# Patient Record
Sex: Male | Born: 1935 | Race: White | Hispanic: No | Smoking: Former smoker
Health system: Southern US, Community
[De-identification: ages and names within clinical notes are randomized; demographics above are authoritative.]

## PROBLEM LIST (undated history)

## (undated) DIAGNOSIS — N400 Enlarged prostate without lower urinary tract symptoms: Secondary | ICD-10-CM

## (undated) DIAGNOSIS — J45990 Exercise induced bronchospasm: Secondary | ICD-10-CM

## (undated) DIAGNOSIS — I4891 Unspecified atrial fibrillation: Secondary | ICD-10-CM

## (undated) DIAGNOSIS — R609 Edema, unspecified: Secondary | ICD-10-CM

## (undated) DIAGNOSIS — Z86010 Personal history of colonic polyps: Secondary | ICD-10-CM

## (undated) DIAGNOSIS — E785 Hyperlipidemia, unspecified: Secondary | ICD-10-CM

## (undated) DIAGNOSIS — H3530 Unspecified macular degeneration: Secondary | ICD-10-CM

## (undated) DIAGNOSIS — N189 Chronic kidney disease, unspecified: Secondary | ICD-10-CM

## (undated) DIAGNOSIS — G4700 Insomnia, unspecified: Secondary | ICD-10-CM

## (undated) DIAGNOSIS — K219 Gastro-esophageal reflux disease without esophagitis: Secondary | ICD-10-CM

## (undated) DIAGNOSIS — M47816 Spondylosis without myelopathy or radiculopathy, lumbar region: Secondary | ICD-10-CM

## (undated) DIAGNOSIS — Z96649 Presence of unspecified artificial hip joint: Secondary | ICD-10-CM

## (undated) DIAGNOSIS — I509 Heart failure, unspecified: Secondary | ICD-10-CM

## (undated) DIAGNOSIS — Z8639 Personal history of other endocrine, nutritional and metabolic disease: Secondary | ICD-10-CM

## (undated) DIAGNOSIS — J310 Chronic rhinitis: Secondary | ICD-10-CM

## (undated) DIAGNOSIS — I452 Bifascicular block: Secondary | ICD-10-CM

## (undated) DIAGNOSIS — R0602 Shortness of breath: Secondary | ICD-10-CM

## (undated) DIAGNOSIS — J45909 Unspecified asthma, uncomplicated: Secondary | ICD-10-CM

## (undated) DIAGNOSIS — I5021 Acute systolic (congestive) heart failure: Secondary | ICD-10-CM

## (undated) DIAGNOSIS — E669 Obesity, unspecified: Secondary | ICD-10-CM

## (undated) DIAGNOSIS — I10 Essential (primary) hypertension: Secondary | ICD-10-CM

## (undated) DIAGNOSIS — R531 Weakness: Secondary | ICD-10-CM

## (undated) DIAGNOSIS — M1990 Unspecified osteoarthritis, unspecified site: Secondary | ICD-10-CM

## (undated) HISTORY — DX: Bifascicular block: I45.2

## (undated) HISTORY — DX: Benign prostatic hyperplasia without lower urinary tract symptoms: N40.0

## (undated) HISTORY — DX: Unspecified asthma, uncomplicated: J45.909

## (undated) HISTORY — PX: VASECTOMY: SHX75

## (undated) HISTORY — DX: Hyperlipidemia, unspecified: E78.5

## (undated) HISTORY — DX: Essential (primary) hypertension: I10

## (undated) HISTORY — DX: Spondylosis without myelopathy or radiculopathy, lumbar region: M47.816

## (undated) HISTORY — DX: Presence of unspecified artificial hip joint: Z96.649

## (undated) HISTORY — DX: Chronic rhinitis: J31.0

## (undated) HISTORY — DX: Insomnia, unspecified: G47.00

## (undated) HISTORY — DX: Unspecified atrial fibrillation: I48.91

## (undated) HISTORY — DX: Personal history of colonic polyps: Z86.010

## (undated) HISTORY — DX: Weakness: R53.1

## (undated) HISTORY — DX: Edema, unspecified: R60.9

## (undated) HISTORY — DX: Acute systolic (congestive) heart failure: I50.21

## (undated) HISTORY — DX: Obesity, unspecified: E66.9

## (undated) HISTORY — DX: Personal history of other endocrine, nutritional and metabolic disease: Z86.39

## (undated) HISTORY — PX: VASCULAR SURGERY: SHX849

---

## 1999-03-25 ENCOUNTER — Ambulatory Visit: Admission: RE | Admit: 1999-03-25 | Discharge: 1999-03-25 | Payer: Self-pay | Admitting: Internal Medicine

## 2002-12-19 ENCOUNTER — Encounter: Payer: Self-pay | Admitting: Internal Medicine

## 2002-12-19 ENCOUNTER — Encounter: Admission: RE | Admit: 2002-12-19 | Discharge: 2002-12-19 | Payer: Self-pay | Admitting: Internal Medicine

## 2004-04-08 ENCOUNTER — Ambulatory Visit: Payer: Self-pay | Admitting: Internal Medicine

## 2004-06-29 ENCOUNTER — Ambulatory Visit: Payer: Self-pay | Admitting: Internal Medicine

## 2006-04-08 ENCOUNTER — Ambulatory Visit: Payer: Self-pay | Admitting: Internal Medicine

## 2006-10-18 ENCOUNTER — Ambulatory Visit: Payer: Self-pay | Admitting: Internal Medicine

## 2006-10-18 LAB — CONVERTED CEMR LAB
BUN: 18 mg/dL (ref 6–23)
GFR calc non Af Amer: 100 mL/min
Glucose, Bld: 93 mg/dL (ref 70–99)
PSA: 0.74 ng/mL (ref 0.10–4.00)
Potassium: 4.4 meq/L (ref 3.5–5.1)

## 2007-02-06 DIAGNOSIS — J45909 Unspecified asthma, uncomplicated: Secondary | ICD-10-CM | POA: Insufficient documentation

## 2007-02-06 DIAGNOSIS — I10 Essential (primary) hypertension: Secondary | ICD-10-CM | POA: Insufficient documentation

## 2007-02-06 HISTORY — DX: Unspecified asthma, uncomplicated: J45.909

## 2007-02-17 ENCOUNTER — Ambulatory Visit: Payer: Self-pay | Admitting: Internal Medicine

## 2007-04-26 ENCOUNTER — Ambulatory Visit: Payer: Self-pay | Admitting: Internal Medicine

## 2007-04-26 LAB — CONVERTED CEMR LAB
BUN: 15 mg/dL (ref 6–23)
Calcium: 9.9 mg/dL (ref 8.4–10.5)
Creatinine, Ser: 0.7 mg/dL (ref 0.4–1.5)
GFR calc non Af Amer: 116 mL/min
Hgb A1c MFr Bld: 5.6 % (ref 4.6–6.0)
Sodium: 139 meq/L (ref 135–145)

## 2007-06-12 ENCOUNTER — Telehealth (INDEPENDENT_AMBULATORY_CARE_PROVIDER_SITE_OTHER): Payer: Self-pay | Admitting: *Deleted

## 2007-08-30 ENCOUNTER — Ambulatory Visit: Payer: Self-pay | Admitting: Internal Medicine

## 2007-08-30 LAB — CONVERTED CEMR LAB
CO2: 34 meq/L — ABNORMAL HIGH (ref 19–32)
Chloride: 106 meq/L (ref 96–112)
Creatinine, Ser: 0.6 mg/dL (ref 0.4–1.5)
Hgb A1c MFr Bld: 5.7 % (ref 4.6–6.0)

## 2008-01-02 ENCOUNTER — Ambulatory Visit: Payer: Self-pay | Admitting: Internal Medicine

## 2008-01-02 DIAGNOSIS — G4700 Insomnia, unspecified: Secondary | ICD-10-CM

## 2008-01-02 HISTORY — DX: Insomnia, unspecified: G47.00

## 2008-01-02 LAB — CONVERTED CEMR LAB
CO2: 30 meq/L (ref 19–32)
Chloride: 101 meq/L (ref 96–112)
Glucose, Bld: 101 mg/dL — ABNORMAL HIGH (ref 70–99)
Sodium: 138 meq/L (ref 135–145)

## 2008-02-15 ENCOUNTER — Ambulatory Visit: Payer: Self-pay | Admitting: Internal Medicine

## 2008-04-03 ENCOUNTER — Ambulatory Visit: Payer: Self-pay | Admitting: Internal Medicine

## 2008-04-03 LAB — CONVERTED CEMR LAB
Albumin: 4.1 g/dL (ref 3.5–5.2)
Alkaline Phosphatase: 68 units/L (ref 39–117)
Bilirubin, Direct: 0.1 mg/dL (ref 0.0–0.3)
Total Protein: 6.9 g/dL (ref 6.0–8.3)

## 2008-07-03 ENCOUNTER — Telehealth: Payer: Self-pay | Admitting: Internal Medicine

## 2008-07-03 ENCOUNTER — Ambulatory Visit: Payer: Self-pay | Admitting: Internal Medicine

## 2008-07-03 DIAGNOSIS — J330 Polyp of nasal cavity: Secondary | ICD-10-CM | POA: Insufficient documentation

## 2008-11-01 ENCOUNTER — Ambulatory Visit: Payer: Self-pay | Admitting: Internal Medicine

## 2008-11-01 DIAGNOSIS — E785 Hyperlipidemia, unspecified: Secondary | ICD-10-CM | POA: Insufficient documentation

## 2008-11-01 LAB — CONVERTED CEMR LAB
Cholesterol: 237 mg/dL — ABNORMAL HIGH (ref 0–200)
GFR calc non Af Amer: 115.47 mL/min (ref >60)
Glucose, Bld: 104 mg/dL — ABNORMAL HIGH (ref 70–99)
HDL: 46.6 mg/dL (ref >39.00)
Hgb A1c MFr Bld: 5.8 % (ref 4.6–6.5)
Microalb Creat Ratio: 2.9 mg/g (ref 0.0–30.0)
Microalb, Ur: 0.2 mg/dL (ref 0.0–1.9)
Triglycerides: 101 mg/dL (ref 0.0–149.0)

## 2009-03-04 ENCOUNTER — Ambulatory Visit: Payer: Self-pay | Admitting: Internal Medicine

## 2009-03-04 LAB — CONVERTED CEMR LAB
CO2: 30 meq/L (ref 19–32)
Calcium: 9.5 mg/dL (ref 8.4–10.5)
GFR calc non Af Amer: 98.9 mL/min (ref >60)
Glucose, Bld: 89 mg/dL (ref 70–99)
Potassium: 4.1 meq/L (ref 3.5–5.1)

## 2009-06-02 ENCOUNTER — Ambulatory Visit: Payer: Self-pay | Admitting: Internal Medicine

## 2009-06-02 DIAGNOSIS — J310 Chronic rhinitis: Secondary | ICD-10-CM | POA: Insufficient documentation

## 2009-06-02 HISTORY — DX: Chronic rhinitis: J31.0

## 2009-10-01 ENCOUNTER — Ambulatory Visit: Payer: Self-pay | Admitting: Internal Medicine

## 2009-10-01 DIAGNOSIS — Z8639 Personal history of other endocrine, nutritional and metabolic disease: Secondary | ICD-10-CM | POA: Insufficient documentation

## 2009-10-01 HISTORY — DX: Personal history of other endocrine, nutritional and metabolic disease: Z86.39

## 2009-10-01 LAB — CONVERTED CEMR LAB
CO2: 33 meq/L — ABNORMAL HIGH (ref 19–32)
Calcium: 9.7 mg/dL (ref 8.4–10.5)
Chloride: 98 meq/L (ref 96–112)
Creatinine, Ser: 1 mg/dL (ref 0.4–1.5)
GFR calc non Af Amer: 76.33 mL/min (ref >60)
Sodium: 141 meq/L (ref 135–145)
TSH: 1.59 microintl units/mL (ref 0.35–5.50)

## 2010-02-13 ENCOUNTER — Ambulatory Visit: Payer: Self-pay | Admitting: Internal Medicine

## 2010-02-13 DIAGNOSIS — L210 Seborrhea capitis: Secondary | ICD-10-CM | POA: Insufficient documentation

## 2010-02-13 LAB — CONVERTED CEMR LAB
CO2: 31 meq/L (ref 19–32)
Chloride: 100 meq/L (ref 96–112)
Cholesterol: 249 mg/dL — ABNORMAL HIGH (ref 0–200)
Direct LDL: 181.2 mg/dL
Hgb A1c MFr Bld: 5.9 % (ref 4.6–6.5)
Potassium: 4.4 meq/L (ref 3.5–5.1)

## 2010-05-13 ENCOUNTER — Ambulatory Visit
Admission: RE | Admit: 2010-05-13 | Discharge: 2010-05-13 | Payer: Self-pay | Source: Home / Self Care | Attending: Internal Medicine | Admitting: Internal Medicine

## 2010-05-13 DIAGNOSIS — N400 Enlarged prostate without lower urinary tract symptoms: Secondary | ICD-10-CM

## 2010-05-13 HISTORY — DX: Benign prostatic hyperplasia without lower urinary tract symptoms: N40.0

## 2010-05-21 ENCOUNTER — Ambulatory Visit
Admission: RE | Admit: 2010-05-21 | Discharge: 2010-05-21 | Payer: Self-pay | Source: Home / Self Care | Attending: Internal Medicine | Admitting: Internal Medicine

## 2010-05-21 LAB — CONVERTED CEMR LAB
Cholesterol, target level: 200 mg/dL
LDL Goal: 100 mg/dL

## 2010-06-18 NOTE — Assessment & Plan Note (Signed)
Summary: ?psa inf/njr   Vital Signs:  Patient profile:   77 year old male Weight:      236 pounds Temp:     97.7 degrees F oral BP sitting:   122 / 80  (left arm) Cuff size:   regular  Vitals Entered By: Duard Brady LPN (May 13, 2010 8:04 AM)  [facility]: prostate concerns Is Patient Diabetic? No   Primary Care Provider:  Stacie Glaze MD  CC:  prostate concerns.  History of Present Illness: 37 -year-old patient, who presents with a chief complaint of increased urinary frequency and difficulty.  He has chronic nocturia 3 to 4 times per night and this has increased recently.  He has more urgency and frequency throughout the day.  He states that he must now sit to urinate more efficiently.  A PSA in 2008 was less than one.  He has treated hypertension on diuretic therapy.  No fever, perineal  pain, or constitutional complaints.  No prior history of prostate infection.  His only new medication is fish oil supplements  that he now takes for arthritic complaints.  Allergies (verified): No Known Drug Allergies  Past History:  Past Medical History: Obesity Asthma Diabetes mellitus, type II Hypertension Edema Benign prostatic hypertrophy  Review of Systems       The patient complains of difficulty walking.  The patient denies anorexia, fever, weight loss, weight gain, vision loss, decreased hearing, hoarseness, chest pain, syncope, dyspnea on exertion, peripheral edema, prolonged cough, headaches, hemoptysis, abdominal pain, melena, hematochezia, severe indigestion/heartburn, hematuria, incontinence, genital sores, muscle weakness, suspicious skin lesions, transient blindness, depression, unusual weight change, abnormal bleeding, enlarged lymph nodes, angioedema, breast masses, and testicular masses.    Physical Exam  General:  overweight-appearing.  normal blood pressureoverweight-appearing.   Rectal:  No external abnormalities noted. Normal sphincter tone. No rectal  masses or tenderness. stool hematest negative Prostate:  2+ enlarged.  the right lobe, slightly more prominent2+ enlarged.     Impression & Recommendations:  Problem # 1:  BENIGN PROSTATIC HYPERTROPHY (ICD-600.00)  His updated medication list for this problem includes:    Tamsulosin Hcl 0.4 Mg Caps (Tamsulosin hcl) ..... One daily  His updated medication list for this problem includes:    Tamsulosin Hcl 0.4 Mg Caps (Tamsulosin hcl) ..... One daily  Problem # 2:  HYPERTENSION (ICD-401.9)  His updated medication list for this problem includes:    Maxzide 75-50 Mg Tabs (Triamterene-hctz) .[name]... 1 once daily  His updated medication list for this problem includes:    Maxzide 75-50 Mg Tabs (Triamterene-hctz) .[name]... 1 once daily  Complete Medication List: 1)  Maxzide 75-50 Mg Tabs (Triamterene-hctz) .[name].. 1 once daily 2)  Adult Aspirin Low Strength 81 Mg Tbdp (Aspirin) .[name].. 1 once daily 3)  Albuterol 90 Mcg/act Aers (Albuterol) .... As needed 4)  Multivitamins Tabs (Multiple vitamin) .... Take 1 by mouth qd 5)  Ocuvite Preservision Tabs (Multiple vitamins-minerals) .... 2 once daily 6)  Zolpidem Tartrate 10 Mg Tabs (Zolpidem tartrate) .[name].. 1 at bedtime as needed sleep 7)  Astepro 0.15 % Soln (Azelastine hcl) .... Two spray in each nostril daily 8)  Clobetasol Propionate 0.05 % Foam (Clobetasol propionate) .... Apply to scalp daily as needed for sebborrhea 9)  Fish Oil 1000 Mg Caps (Omega-3 fatty acids) .... Qday 10)  Tamsulosin Hcl 0.4 Mg Caps (Tamsulosin hcl) .... One daily  Patient Instructions: 1)  Limit your Sodium (Salt) to less than 2 grams a day(slightly less than 1/2 a  teaspoon) to prevent fluid retention, swelling, or worsening of symptoms. 2)  It is important that you exercise regularly at least 20 minutes 5 times a week. If you develop chest pain, have severe difficulty breathing, or feel very tired , stop exercising immediately and seek medical attention. 3)  follow-up next  week as scheduled Prescriptions: TAMSULOSIN HCL 0.4 MG CAPS (TAMSULOSIN HCL) one daily  #90 x 0   Entered and Authorized by:   [name]  MD   Signed by:   [name]  MD on 05/13/2010   Method used:   Electronically to        [facility] [phone]* (retail)       [address]       [place], [place]  [address]       Ph: [phone]       Fax: [phone]   RxID:   [id number]    Orders Added: 1)  Est. Patient Level III [24401]

## 2010-06-18 NOTE — Assessment & Plan Note (Signed)
Summary: 3 month rov/njr---PT [facility] // RS   Vital Signs:  Patient profile:   77 year old male Height:      67 inches Weight:      234 pounds BMI:     36.78 Temp:     98.2 degrees F oral Pulse rate:   76 / minute Resp:     14 per minute BP sitting:   110 / 70  (left arm)  Vitals Entered By: Willy Eddy, LPN (February 13, 2010 11:00 AM) CC: roa, Hypertension Management Is Patient Diabetic? No  2  Primary Care Provider:  Stacie Glaze MD  CC:  roa and Hypertension Management.  History of Present Illness: increased vasomotor rhinitis, has not been taking the astepro he has not had any ambien and has not been able to sleep well   Hypertension History:      He denies headache, chest pain, palpitations, dyspnea with exertion, orthopnea, PND, peripheral edema, visual symptoms, neurologic problems, syncope, and side effects from treatment.        Positive major cardiovascular risk factors include male age 56 years old or older, diabetes, hyperlipidemia, and hypertension.  Negative major cardiovascular risk factors include non-tobacco-user status.     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 1 Once Daily 2)  Adult Aspirin Low Strength 81  Mg  Tbdp (Aspirin) .[name].. 1 Once Daily 3)  Albuterol 90 Mcg/act  Aers (Albuterol) .... As Needed 4)  Multivitamins   Tabs (Multiple Vitamin) .... Take 1 By Mouth Qd 5)  Ocuvite Preservision  Tabs (Multiple Vitamins-Minerals) .... 2 Once Daily 6)  Zolpidem Tartrate 10 Mg Tabs (Zolpidem Tartrate) .[name].. 1 At Bedtime As Needed Sleep 7)  Astepro 0.15 % Soln (Azelastine Hcl) .... Two Spray in Each Nostril Daily  Current Medications (verified): 1)  Maxzide 75-50 Mg  Tabs (Triamterene-Hctz) .[name].. 1 Once Daily 2)  Adult Aspirin Low Strength 81 Mg  Tbdp (Aspirin) .[name].. 1 Once Daily 3)  Albuterol 90 Mcg/act  Aers (Albuterol) .... As Needed 4)  Multivitamins   Tabs (Multiple Vitamin) .... Take 1 By Mouth Qd 5)  Ocuvite Preservision  Tabs (Multiple Vitamins-Minerals) .... 2 Once Daily 6)  Zolpidem Tartrate 10 Mg Tabs (Zolpidem Tartrate) .[name].. 1 At Bedtime As Needed Sleep 7)  Astepro 0.15 % Soln (Azelastine Hcl) .... Two Spray in Each Nostril Daily  Allergies (verified): No Known Drug Allergies  Past History:  Social History: Last updated: 02/06/2007 Retired Married Never Smoked  Risk Factors: Smoking Status: never (02/13/2010) Passive Smoke Exposure: no (02/13/2010)  Past medical, surgical, family and social histories (including risk factors) reviewed, and no changes noted (except as noted below).  Past Medical History: Reviewed history from 02/06/2007 and no changes required. Obesity  Asthma Diabetes mellitus, type II Hypertension Edema  Past Surgical History: Reviewed history from 02/06/2007 and no changes required. Denies surgical history  Family History: Reviewed history and no changes required.  Social History: Reviewed history from 02/06/2007 and no changes required. Retired Married Never Smoked Passive Smoke Exposure:  no  Review of Systems  The patient denies anorexia, fever, weight loss, weight gain, vision loss, decreased hearing, hoarseness, chest pain, syncope,  dyspnea on exertion, peripheral edema, prolonged cough, headaches, hemoptysis, abdominal pain, melena, hematochezia, severe indigestion/heartburn, hematuria, incontinence, genital sores, muscle weakness, suspicious skin lesions, transient blindness, difficulty walking, depression, unusual weight change, abnormal bleeding, enlarged lymph nodes, angioedema, breast masses, and testicular masses.         Flu Vaccine Consent Questions     Do you have a history of severe allergic reactions to this vaccine? no    Any prior history of allergic reactions to egg and/or gelatin? no    Do you have a sensitivity to the preservative Thimersol? no    Do you have a past history of Guillan-Barre Syndrome? no    Do you currently have an acute febrile illness? no    Have you ever had a severe reaction to latex? no    Vaccine information given and explained to patient? yes    Are you currently pregnant? no    Lot Number:[id number]   Exp Date:11/14/2010   Site Given  Left Deltoid IM   Physical Exam  General:  alert and overweight-appearing.   Head:  Normocephalic and atraumatic without obvious abnormalities. No apparent alopecia or balding. grey hair Ears:  R ear normal and L ear normal.   Nose:  no external deformity, mucosal erythema, and mucosal edema.   Mouth:  postnasal drip.   Neck:  supple and full ROM.   Lungs:  normal respiratory effort and no wheezes.   Heart:  normal rate and regular rhythm.   Abdomen:  soft, non-tender, and normal bowel sounds.   Msk:  normal ROM and no joint tenderness.   Extremities:  trace left pedal edema and trace right pedal edema.   Neurologic:  alert & oriented X3 and DTRs symmetrical and normal.     Impression & Recommendations:  Problem # 1:  CHRONIC RHINITIS (ICD-472.0) astepro rx given  Problem # 2:  SEBORRHEA CAPITIS (ICD-690.11) clobetason foam trial  Problem # 3:  INSOMNIA, CHRONIC, MILD (ICD-307.42) refill the ambien  Problem # 4:  HYPERTENSION  (ICD-401.9)  His updated medication list for this problem includes:    Maxzide 75-50 Mg Tabs (Triamterene-hctz) .[name]... 1 once daily  BP today: 110/70 Prior BP: 110/70 (10/01/2009)  Prior 10 Yr Risk Heart Disease: Not enough information (04/26/2007)  Labs Reviewed: K+: 4.5 (10/01/2009) Creat: : 1.0 (10/01/2009)   Chol: 254 (10/01/2009)   HDL: 54.40 (10/01/2009)   TG: 101.0 (11/01/2008)  Complete Medication List: 1)  Maxzide 75-50 Mg Tabs (Triamterene-hctz) .[name].. 1 once daily 2)  Adult Aspirin Low Strength 81 Mg Tbdp (Aspirin) .[name].. 1 once daily 3)  Albuterol 90 Mcg/act Aers (Albuterol) .... As needed 4)  Multivitamins Tabs (Multiple vitamin) .... Take 1 by mouth qd 5)  Ocuvite Preservision Tabs (Multiple vitamins-minerals) .... 2 once daily 6)  Zolpidem Tartrate 10 Mg Tabs (Zolpidem tartrate) .[name].. 1 at bedtime as needed sleep 7)  Astepro 0.15 % Soln (Azelastine hcl) .... Two spray in each nostril daily 8)  Clobetasol Propionate 0.05 % Foam (Clobetasol propionate) .... Apply to scalp daily as needed for sebborrhea  Other Orders: Flu Vaccine 77yrs + MEDICARE PATIENTS (E4540) Administration Flu vaccine - MCR (G0008) Venipuncture (98119) TLB-BMP (Basic Metabolic Panel-BMET) (80048-METABOL) TLB-A1C / Hgb A1C (Glycohemoglobin) (83036-A1C) TLB-Cholesterol, HDL (83718-HDL) TLB-Cholesterol, Direct LDL (83721-DIRLDL) TLB-Cholesterol, Total (82465-CHO)  Hypertension Assessment/Plan:      The patient's hypertensive risk group is category C: Target organ damage and/or diabetes.  Today's blood pressure is 110/70.  His blood pressure goal is < 130/80.  Patient Instructions: 1)  Please schedule a follow-up appointment in 4 months. Prescriptions: CLOBETASOL PROPIONATE 0.05 % FOAM (CLOBETASOL PROPIONATE) apply to scalp daily as needed for sebborrhea  #1 x 6   Entered and Authorized by:   Stacie Glaze MD   Signed by:   Stacie Glaze MD on 02/13/2010   Method used:   Electronically to         [facility] [phone]* (retail)       [address]       [place], [place]  [address]       Ph: [phone]       Fax: [phone]   RxID:   [id number] CLOBETASOL PROPIONATE 0.05 % FOAM (CLOBETASOL PROPIONATE) apply to scalp daily as needed for sebborrhea  #1 can x 6   Entered and Authorized by:   Stacie Glaze MD   Signed by:   Stacie Glaze MD on 02/13/2010   Method used:   Print then Give to Patient   RxID:   [id number] ZOLPIDEM TARTRATE 10 MG TABS (ZOLPIDEM TARTRATE) 1 at bedtime as needed sleep  #30 x 5   Entered and Authorized by:   Stacie Glaze MD   Signed by:   Stacie Glaze MD on 02/13/2010   Method used:   Print then Give to Patient   RxID:   [id number] ASTEPRO 0.15 % SOLN (AZELASTINE HCL) two spray in each nostril daily  #1 vial x 11   Entered and Authorized by:   Stacie Glaze MD   Signed by:   Stacie Glaze MD on 02/13/2010   Method used:   Electronically to        [facility] [phone]* (retail)       [address]       [place], [place]  [address]       Ph: [phone]       Fax: [phone]   RxID:   [id number]   Appended Document: Orders Update    Clinical Lists Changes  Orders: Added new Service order of Specimen Handling (94854) - Signed

## 2010-06-18 NOTE — Assessment & Plan Note (Signed)
Summary: 3 MONTH RIOV/NJR/pt [facility]//ccm   Vital Signs:  Patient profile:   77 year old male Height:      67 inches Weight:      240 pounds BMI:     37.73 Temp:     98.2 degrees F oral Pulse rate:   76 / minute Resp:     14 per minute BP sitting:   110 / 70  (left arm)  Vitals Entered By: Willy Eddy, LPN (Oct 01, 2009 8:38 AM) CC: roa, Hypertension Management   CC:  roa and Hypertension Management.  History of Present Illness: The pts edema in the legs is worse at the end of the day and he has resumed a full tablet   Hypertension History:      He complains of peripheral edema, but denies headache, chest pain, palpitations, dyspnea with exertion, orthopnea, PND, visual symptoms, neurologic problems, syncope, and side effects from treatment.  no sob or cp.        Positive major cardiovascular risk factors include male age 59 years old or older, diabetes, hyperlipidemia, and hypertension.  Negative major cardiovascular risk factors include non-tobacco-user status.     Preventive Screening-Counseling & Management  Alcohol-Tobacco     Smoking Status: never  Problems Prior to Update: 1)  Chronic Rhinitis  (ICD-472.0) 2)  Hyperlipidemia  (ICD-272.4) 3)  Nasal Polyp  (ICD-471.0) 4)  Insomnia, Chronic, Mild  (ICD-307.42) 5)  Hypertension  (ICD-401.9) 6)  Diabetes Mellitus, Type II  (ICD-250.00) 7)  Asthma  (ICD-493.90)  Current Problems (verified): 1)  Chronic Rhinitis  (ICD-472.0) 2)  Hyperlipidemia  (ICD-272.4) 3)  Nasal Polyp  (ICD-471.0) 4)  Insomnia, Chronic, Mild  (ICD-307.42) 5)  Hypertension  (ICD-401.9) 6)  Diabetes Mellitus, Type II  (ICD-250.00) 7)  Asthma  (ICD-493.90)  Medications Prior to Update: 1)  Maxzide 75-50 Mg  Tabs (Triamterene-Hctz) .[name].. 1 Once Daily 2)  Adult Aspirin Low Strength 81 Mg  Tbdp (Aspirin) .[name].. 1 Once Daily 3)  Albuterol 90 Mcg/act  Aers (Albuterol) .... As Needed 4)  Vitamin C 1000 Mg  Tabs (Ascorbic Acid) .... Take 1 By Mouth  Qd 5)  Vitamin E 1000 Unit  Caps (Vitamin E) .... Take 1 By Mouth Qd 6)  Zinc 50 Mg  Caps (Zinc) .... Take1 By Mouth Qd 7)  Multivitamins   Tabs (Multiple Vitamin) .... Take 1 By Mouth Qd 8)  Ocuvite Preservision  Tabs (Multiple Vitamins-Minerals) .... 2 Once Daily 9)  Zolpidem Tartrate 10 Mg Tabs (Zolpidem Tartrate) .[name].. 1 At Bedtime As Needed Sleep 10)  Astepro 0.15 % Soln (Azelastine Hcl) .... Two Spray in Each Nostril Daily  Current Medications (verified): 1)  Maxzide 75-50 Mg  Tabs (Triamterene-Hctz) .[name].. 1 Once Daily 2)  Adult Aspirin Low Strength 81 Mg  Tbdp (Aspirin) .[name].. 1 Once Daily 3)  Albuterol 90 Mcg/act  Aers (Albuterol) .... As Needed 4)  Multivitamins   Tabs (Multiple Vitamin) .... Take 1 By Mouth Qd 5)  Ocuvite Preservision  Tabs (Multiple Vitamins-Minerals) .... 2 Once Daily 6)  Zolpidem Tartrate 10 Mg Tabs (Zolpidem Tartrate) .[name].. 1 At Bedtime As Needed Sleep 7)  Astepro 0.15 % Soln (Azelastine Hcl) .... Two Spray in Each Nostril Daily  Allergies (verified): No Known Drug Allergies  Past History:  Social History: Last updated: 02/06/2007 Retired Married Never Smoked  Risk Factors: Smoking Status: never (10/01/2009)  Past medical, surgical, family and social histories (including risk factors) reviewed, and no changes noted (except as noted below).  Past Medical History: Reviewed history from 02/06/2007 and no changes required. Obesity Asthma Diabetes mellitus, type II Hypertension Edema  Past Surgical History: Reviewed history from 02/06/2007 and no changes required. Denies surgical history  Family History: Reviewed history and no changes required.  Social History: Reviewed history from 02/06/2007 and no changes required. Retired Married Never Smoked  Review of Systems  The patient denies anorexia, fever, weight loss, weight gain, vision loss, decreased hearing, hoarseness, chest pain, syncope, dyspnea on exertion, peripheral edema, prolonged  cough, headaches, hemoptysis, abdominal pain, melena, hematochezia, severe indigestion/heartburn, hematuria, incontinence, genital sores, muscle weakness, suspicious skin lesions, transient blindness, difficulty walking, depression, unusual weight change, abnormal bleeding, enlarged lymph nodes, angioedema, and breast masses.    Physical Exam  General:  alert and overweight-appearing.   Head:  Normocephalic and atraumatic without obvious abnormalities. No apparent alopecia or balding. grey hair Ears:  R ear normal and L ear normal.   Nose:  no external deformity, mucosal erythema, and mucosal edema.   Mouth:  postnasal drip.   Lungs:  normal respiratory effort and no wheezes.   Heart:  normal rate and regular rhythm.     Impression & Recommendations:  Problem # 1:  HYPERTENSION (ICD-401.9)  mild edema His updated medication list for this problem includes:    Maxzide 75-50 Mg Tabs (Triamterene-hctz) .[name]... 1 once daily  Orders: Venipuncture (16109) TLB-BMP (Basic Metabolic Panel-BMET) (80048-METABOL)  Problem # 2:  ASTHMA (ICD-493.90) stable His updated medication list for this problem includes:    Albuterol 90 Mcg/act Aers (Albuterol) .[name]... As needed  Problem # 3:  DYSMETABOLIC SYNDROME X (ICD-277.7) walking and weight loss needed  Complete Medication List: 1)  Maxzide 75-50 Mg Tabs (Triamterene-hctz) .[name].. 1 once daily 2)  Adult Aspirin Low Strength 81 Mg Tbdp (Aspirin) .[name].. 1 once daily 3)  Albuterol 90 Mcg/act Aers (Albuterol) .... As needed 4)  Multivitamins Tabs (Multiple vitamin) .... Take 1 by mouth qd 5)  Ocuvite Preservision Tabs (Multiple vitamins-minerals) .... 2 once daily 6)  Zolpidem Tartrate 10 Mg Tabs (Zolpidem tartrate) .[name].. 1 at bedtime as needed sleep 7)  Astepro 0.15 % Soln (Azelastine hcl) .... Two spray in each nostril daily  Other Orders: TLB-Cholesterol, HDL (83718-HDL) TLB-Cholesterol, Direct LDL (83721-DIRLDL) TLB-Cholesterol, Total  (82465-CHO) TLB-TSH (Thyroid Stimulating Hormone) (84443-TSH)  Hypertension Assessment/Plan:      The patient's hypertensive risk group is category C: Target organ damage and/or diabetes.  Today's blood pressure is 110/70.  His blood pressure goal is < 130/80.  Patient Instructions: 1)  It is important that you exercise regularly at least 20 minutes 5 times a week. If you develop chest pain, have severe difficulty breathing, or feel very tired , stop exercising immediately and seek medical attention.

## 2010-06-18 NOTE — Assessment & Plan Note (Signed)
Summary: 3 MNTH ROV//SLM   Vital Signs:  Patient profile:   77 year old male Height:      67 inches Weight:      236 pounds BMI:     37.10 Temp:     98.2 degrees F oral Pulse rate:   80 / minute Resp:     14 per minute BP sitting:   124 / 80  (left arm)  Vitals Entered By: Willy Eddy, LPN (June 02, 2009 9:49 AM)  Nutrition Counseling: Patient's BMI is greater than 25 and therefore counseled on weight management options. CC: roa, Hypertension Management   CC:  roa and Hypertension Management.  History of Present Illness: Pt has been on a diet since the holidays and has loss most of what he gained his cbgs are stable  Hypertension History:      He denies headache, chest pain, palpitations, dyspnea with exertion, orthopnea, PND, peripheral edema, visual symptoms, neurologic problems, syncope, and side effects from treatment.  He notes no problems with any antihypertensive medication side effects.        Positive major cardiovascular risk factors include male age 37 years old or older, diabetes, hyperlipidemia, and hypertension.  Negative major cardiovascular risk factors include non-tobacco-user status.      Problems Prior to Update: 1)  Hyperlipidemia  (ICD-272.4) 2)  Nasal Polyp  (ICD-471.0) 3)  Insomnia, Chronic, Mild  (ICD-307.42) 4)  Hypertension  (ICD-401.9) 5)  Diabetes Mellitus, Type II  (ICD-250.00) 6)  Asthma  (ICD-493.90)  Medications Prior to Update: 1)  Maxzide 75-50 Mg  Tabs (Triamterene-Hctz) .[name].. 1 Once Daily 2)  Adult Aspirin Low Strength 81 Mg  Tbdp (Aspirin) .[name].. 1 Once Daily 3)  Albuterol 90 Mcg/act  Aers (Albuterol) .... As Needed 4)  Vitamin C 1000 Mg  Tabs (Ascorbic Acid) .... Take 1 By Mouth Qd 5)  Vitamin E 1000 Unit  Caps (Vitamin E) .... Take 1 By Mouth Qd 6)  Zinc 50 Mg  Caps (Zinc) .... Take1 By Mouth Qd 7)  Multivitamins   Tabs (Multiple Vitamin) .... Take 1 By Mouth Qd 8)  Ocuvite Preservision  Tabs (Multiple Vitamins-Minerals)  .... 2 Once Daily 9)  Zolpidem Tartrate 10 Mg Tabs (Zolpidem Tartrate) .[name].. 1 At Bedtime As Needed Sleep 10)  Flunisolide 0.025 % Soln (Flunisolide) .... Two Spray in Each Nostril Before Bed  Current Medications (verified): 1)  Maxzide 75-50 Mg  Tabs (Triamterene-Hctz) .[name].. 1 Once Daily 2)  Adult Aspirin Low Strength 81 Mg  Tbdp (Aspirin) .[name].. 1 Once Daily 3)  Albuterol 90 Mcg/act  Aers (Albuterol) .... As Needed 4)  Vitamin C 1000 Mg  Tabs (Ascorbic Acid) .... Take 1 By Mouth Qd 5)  Vitamin E 1000 Unit  Caps (Vitamin E) .... Take 1 By Mouth Qd 6)  Zinc 50 Mg  Caps (Zinc) .... Take1 By Mouth Qd 7)  Multivitamins   Tabs (Multiple Vitamin) .... Take 1 By Mouth Qd 8)  Ocuvite Preservision  Tabs (Multiple Vitamins-Minerals) .... 2 Once Daily 9)  Zolpidem Tartrate 10 Mg Tabs (Zolpidem Tartrate) .[name].. 1 At Bedtime As Needed Sleep 10)  Astepro 0.15 % Soln (Azelastine Hcl) .... Two Spray in Each Nostril Daily  Allergies (verified): No Known Drug Allergies  Past History:  Social History: Last updated: 02/06/2007 Retired Married Never Smoked  Risk Factors: Smoking Status: never (02/06/2007)  Past medical, surgical, family and social histories (including risk factors) reviewed, and no changes noted (except as noted below).  Past Medical  History: Reviewed history from 02/06/2007 and no changes required. Obesity Asthma Diabetes mellitus, type II Hypertension Edema  Past Surgical History: Reviewed history from 02/06/2007 and no changes required. Denies surgical history  Family History: Reviewed history and no changes required.  Social History: Reviewed history from 02/06/2007 and no changes required. Retired Married Never Smoked  Review of Systems  The patient denies anorexia, fever, weight loss, weight gain, vision loss, decreased hearing, hoarseness, chest pain, syncope, dyspnea on exertion, peripheral edema, prolonged cough, headaches, hemoptysis, abdominal pain, melena,  hematochezia, severe indigestion/heartburn, hematuria, incontinence, genital sores, muscle weakness, suspicious skin lesions, transient blindness, difficulty walking, depression, unusual weight change, abnormal bleeding, enlarged lymph nodes, angioedema, and breast masses.    Physical Exam  General:  alert and overweight-appearing.   Head:  Normocephalic and atraumatic without obvious abnormalities. No apparent alopecia or balding. grey hair Ears:  R ear normal and L ear normal.   Nose:  no external deformity, mucosal erythema, and mucosal edema.   Mouth:  postnasal drip.   Neck:  supple and full ROM.   Lungs:  normal respiratory effort and no wheezes.   Heart:  normal rate and regular rhythm.   Abdomen:  soft, non-tender, and normal bowel sounds.   Msk:  normal ROM and no joint tenderness.   Extremities:  trace left pedal edema and trace right pedal edema.   Neurologic:  alert & oriented X3 and DTRs symmetrical and normal.     Impression & Recommendations:  Problem # 1:  DIABETES MELLITUS, TYPE II (ICD-250.00) Assessment Unchanged  His updated medication list for this problem includes:    Adult Aspirin Low Strength 81 Mg Tbdp (Aspirin) .[name]... 1 once daily  Labs Reviewed: Creat: 0.8 (03/04/2009)    Reviewed HgBA1c results: 5.8 (03/04/2009)  5.8 (11/01/2008)  Problem # 2:  HYPERTENSION (ICD-401.9) Assessment: Unchanged  His updated medication list for this problem includes:    Maxzide 75-50 Mg Tabs (Triamterene-hctz) .[name]... 1 once daily  BP today: 124/80 Prior BP: 122/76 (03/04/2009)  Prior 10 Yr Risk Heart Disease: Not enough information (04/26/2007)  Labs Reviewed: K+: 4.1 (03/04/2009) Creat: : 0.8 (03/04/2009)   Chol: 172 (03/04/2009)   HDL: 37.60 (03/04/2009)   TG: 101.0 (11/01/2008)  Problem # 3:  CHRONIC RHINITIS (ICD-472.0) trial of astepro nasal spray  Problem # 4:  INSOMNIA, CHRONIC, MILD (ICD-307.42) on generic ambien  Complete Medication List: 1)  Maxzide  75-50 Mg Tabs (Triamterene-hctz) .[name].. 1 once daily 2)  Adult Aspirin Low Strength 81 Mg Tbdp (Aspirin) .[name].. 1 once daily 3)  Albuterol 90 Mcg/act Aers (Albuterol) .... As needed 4)  Vitamin C 1000 Mg Tabs (Ascorbic acid) .... Take 1 by mouth qd 5)  Vitamin E 1000 Unit Caps (Vitamin e) .... Take 1 by mouth qd 6)  Zinc 50 Mg Caps (Zinc) .... Take1 by mouth qd 7)  Multivitamins Tabs (Multiple vitamin) .... Take 1 by mouth qd 8)  Ocuvite Preservision Tabs (Multiple vitamins-minerals) .... 2 once daily 9)  Zolpidem Tartrate 10 Mg Tabs (Zolpidem tartrate) .[name].. 1 at bedtime as needed sleep 10)  Astepro 0.15 % Soln (Azelastine hcl) .... Two spray in each nostril daily  Hypertension Assessment/Plan:      The patient's hypertensive risk group is category C: Target organ damage and/or diabetes.  Today's blood pressure is 124/80.  His blood pressure goal is < 130/80.  Patient Instructions: 1)  Please schedule a follow-up appointment in 3 months. Prescriptions: ZOLPIDEM TARTRATE 10 MG TABS (ZOLPIDEM TARTRATE) 1  at bedtime as needed sleep  #30 x 6   Entered and Authorized by:   Stacie Glaze MD   Signed by:   Stacie Glaze MD on 06/02/2009   Method used:   Print then Give to Patient   RxID:   [id number] ASTEPRO 0.15 % SOLN (AZELASTINE HCL) two spray in each nostril daily  #1 vial x 11   Entered and Authorized by:   Stacie Glaze MD   Signed by:   Stacie Glaze MD on 06/02/2009   Method used:   Electronically to        [facility] [phone]* (retail)       [address]       [place], [place]  [address]       Ph: [phone]       Fax: [phone]   RxID:   [id number]

## 2010-06-18 NOTE — Assessment & Plan Note (Signed)
Summary: 4 month rov/njr   Vital Signs:  Patient profile:   77 year old male Weight:      234 pounds Temp:     98.3 degrees F Pulse rate:   85 / minute Pulse rhythm:   regular BP sitting:   112 / 64 CC: Hypertension Management, Lipid Management   Primary Care Provider:  Stacie Glaze MD  CC:  Hypertension Management and Lipid Management.  History of Present Illness: Was seen for urinary frequency and was given tamulosin ( flomax) the flomax helped ( exam was constistent with BPH) added the fish oil walking 1/2 mile daily weight down a little  Hypertension History:      He denies headache, chest pain, palpitations, dyspnea with exertion, orthopnea, PND, peripheral edema, visual symptoms, neurologic problems, syncope, and side effects from treatment.        Positive major cardiovascular risk factors include male age 67 years old or older, diabetes, hyperlipidemia, and hypertension.  Negative major cardiovascular risk factors include non-tobacco-user status.        Further assessment for target organ damage reveals no history of ASHD, stroke/TIA, or peripheral vascular disease.    Lipid Management History:      Positive NCEP/ATP III risk factors include male age 25 years old or older, diabetes, and hypertension.  Negative NCEP/ATP III risk factors include non-tobacco-user status, no ASHD (atherosclerotic heart disease), no prior stroke/TIA, no peripheral vascular disease, and no history of aortic aneurysm.      Preventive Screening-Counseling & Management  Alcohol-Tobacco     Smoking Status: never     Tobacco Counseling: not indicated; no tobacco use  Problems Prior to Update: 1)  Benign Prostatic Hypertrophy  (ICD-600.00) 2)  Seborrhea Capitis  (ICD-690.11) 3)  Chronic Rhinitis  (ICD-472.0) 4)  Hyperlipidemia  (ICD-272.4) 5)  Nasal Polyp  (ICD-471.0) 6)  Insomnia, Chronic, Mild  (ICD-307.42) 7)  Hypertension  (ICD-401.9) 8)  Dysmetabolic Syndrome X  (ICD-277.7) 9)   Asthma  (ICD-493.90)  Medications Prior to Update: 1)  Maxzide 75-50 Mg  Tabs (Triamterene-Hctz) .[name].. 1 Once Daily 2)  Adult Aspirin Low Strength 81 Mg  Tbdp (Aspirin) .[name].. 1 Once Daily 3)  Albuterol 90 Mcg/act  Aers (Albuterol) .... As Needed 4)  Multivitamins   Tabs (Multiple Vitamin) .... Take 1 By Mouth Qd 5)  Ocuvite Preservision  Tabs (Multiple Vitamins-Minerals) .... 2 Once Daily 6)  Zolpidem Tartrate 10 Mg Tabs (Zolpidem Tartrate) .[name].. 1 At Bedtime As Needed Sleep 7)  Astepro 0.15 % Soln (Azelastine Hcl) .... Two Spray in Each Nostril Daily 8)  Clobetasol Propionate 0.05 % Foam (Clobetasol Propionate) .... Apply To Scalp Daily As Needed For Sebborrhea 9)  Fish Oil 1000 Mg Caps (Omega-3 Fatty Acids) .... Qday 10)  Tamsulosin Hcl 0.4 Mg Caps (Tamsulosin Hcl) .... One Daily  Current Medications (verified): 1)  Maxzide 75-50 Mg  Tabs (Triamterene-Hctz) .[name].. 1 Once Daily 2)  Adult Aspirin Low Strength 81 Mg  Tbdp (Aspirin) .[name].. 1 Once Daily 3)  Albuterol 90 Mcg/act  Aers (Albuterol) .... As Needed 4)  Multivitamins   Tabs (Multiple Vitamin) .... Take 1 By Mouth Qd 5)  Ocuvite Preservision  Tabs (Multiple Vitamins-Minerals) .... 2 Once Daily 6)  Zolpidem Tartrate 10 Mg Tabs (Zolpidem Tartrate) .[name].. 1 At Bedtime As Needed Sleep 7)  Astepro 0.15 % Soln (Azelastine Hcl) .... Two Spray in Each Nostril Daily 8)  Clobetasol Propionate 0.05 % Foam (Clobetasol Propionate) .... Apply To Scalp Daily As Needed For  Sebborrhea 9)  Fish Oil 1000 Mg Caps (Omega-3 Fatty Acids) .... Qday 10)  Tamsulosin Hcl 0.4 Mg Caps (Tamsulosin Hcl) .... One Daily  Allergies: No Known Drug Allergies  Past History:  Social History: Last updated: 02/06/2007 Retired Married Never Smoked  Risk Factors: Smoking Status: never (05/21/2010) Passive Smoke Exposure: no (02/13/2010)  Past medical, surgical, family and social histories (including risk factors) reviewed, and no changes noted (except as noted  below).  Past Medical History: Reviewed history from 05/13/2010 and no changes required. Obesity Asthma Diabetes mellitus, type II Hypertension Edema Benign prostatic hypertrophy  Past Surgical History: Reviewed history from 02/06/2007 and no changes required. Denies surgical history  Family History: Reviewed history and no changes required.  Social History: Reviewed history from 02/06/2007 and no changes required. Retired Married Never Smoked  Physical Exam  General:  overweight-appearing.  normal blood pressureoverweight-appearing.   Head:  Normocephalic and atraumatic without obvious abnormalities. No apparent alopecia or balding. grey hair Ears:  R ear normal and L ear normal.   Nose:  no external deformity, mucosal erythema, and mucosal edema.   Mouth:  postnasal drip.   Neck:  supple and full ROM.   Lungs:  normal respiratory effort and no wheezes.   Heart:  normal rate and regular rhythm.   Abdomen:  soft, non-tender, and normal bowel sounds.   Msk:  normal ROM and no joint tenderness.   Extremities:  trace left pedal edema and trace right pedal edema.   Neurologic:  alert & oriented X3 and DTRs symmetrical and normal.     Impression & Recommendations:  Problem # 1:  BENIGN PROSTATIC HYPERTROPHY (ICD-600.00) significant improvement in flow with flomax His updated medication list for this problem includes:    Tamsulosin Hcl 0.4 Mg Caps (Tamsulosin hcl) ..... One daily  PSA: 0.74 (10/18/2006)     Problem # 2:  HYPERLIPIDEMIA (ICD-272.4) Assessment: Unchanged pulse crestor trial just began the fish oil  LDL was 182  Labs Reviewed: SGOT: 18 (04/03/2008)   SGPT: 15 (04/03/2008)  Lipid Goals: Chol Goal: 200 (05/21/2010)   HDL Goal: 40 (05/21/2010)   LDL Goal: 100 (05/21/2010)   TG Goal: 150 (05/21/2010)  Prior 10 Yr Risk Heart Disease: Not enough information (04/26/2007)   HDL:55.70 (02/13/2010), 54.40 (10/01/2009)  Chol:249 (02/13/2010), 254  (10/01/2009)  Trig:101.0 (11/01/2008)  His updated medication list for this problem includes:    Crestor 20 Mg Tabs (Rosuvastatin calcium) ..... One by mouth evert friday night  Problem # 3:  DYSMETABOLIC SYNDROME X (ICD-277.7) monitering weight and DM risks  Complete Medication List: 1)  Maxzide 75-50 Mg Tabs (Triamterene-hctz) .[name].. 1 once daily 2)  Adult Aspirin Low Strength 81 Mg Tbdp (Aspirin) .[name].. 1 once daily 3)  Albuterol 90 Mcg/act Aers (Albuterol) .... As needed 4)  Multivitamins Tabs (Multiple vitamin) .... Take 1 by mouth qd 5)  Ocuvite Preservision Tabs (Multiple vitamins-minerals) .... 2 once daily 6)  Zolpidem Tartrate 10 Mg Tabs (Zolpidem tartrate) .[name].. 1 at bedtime as needed sleep 7)  Astepro 0.15 % Soln (Azelastine hcl) .... Two spray in each nostril daily 8)  Clobetasol Propionate 0.05 % Foam (Clobetasol propionate) .... Apply to scalp daily as needed for sebborrhea 9)  Fish Oil 1000 Mg Caps (Omega-3 fatty acids) .... Qday 10)  Tamsulosin Hcl 0.4 Mg Caps (Tamsulosin hcl) .... One daily 11)  Crestor 20 Mg Tabs (Rosuvastatin calcium) .... One by mouth evert friday night  Hypertension Assessment/Plan:      The patient's hypertensive  risk group is category C: Target organ damage and/or diabetes.  Today's blood pressure is 112/64.  His blood pressure goal is < 130/80.  Lipid Assessment/Plan:      Based on NCEP/ATP III, the patient's risk factor category is "history of diabetes".  The patient's lipid goals are as follows: Total cholesterol goal is 200; LDL cholesterol goal is 100; HDL cholesterol goal is 40; Triglyceride goal is 150.  His LDL cholesterol goal has not been met.  Secondary causes for hyperlipidemia have been ruled out.  He has been counseled on adjunctive measures for lowering his cholesterol and has been provided with dietary instructions.    Patient Instructions: 1)  Please schedule a follow-up appointment in 3 months. 2)  Hepatic Panel prior to visit,  ICD-9:995.20 3)  Lipid Panel prior to visit, ICD-9:272.4 4)  It is important that you exercise regularly at least 20 minutes 5 times a week. If you develop chest pain, have severe difficulty breathing, or feel very tired , stop exercising immediately and seek medical attention. 5)  You need to lose weight. Consider a lower calorie diet and regular exercise.    Orders Added: 1)  Est. Patient Level IV [40981]

## 2010-07-01 ENCOUNTER — Telehealth: Payer: Self-pay | Admitting: *Deleted

## 2010-07-01 MED ORDER — ALBUTEROL SULFATE HFA 108 (90 BASE) MCG/ACT IN AERS: 2.0000 | INHALATION_SPRAY | Freq: Four times a day (QID) | RESPIRATORY_TRACT | Status: DC | PRN

## 2010-07-01 NOTE — Telephone Encounter (Signed)
Pt would like a prescription for Albuterol called to Nicolette Bang at [facility].

## 2010-07-03 ENCOUNTER — Other Ambulatory Visit: Payer: Self-pay | Admitting: Internal Medicine

## 2010-07-03 DIAGNOSIS — I10 Essential (primary) hypertension: Secondary | ICD-10-CM

## 2010-08-13 ENCOUNTER — Other Ambulatory Visit (INDEPENDENT_AMBULATORY_CARE_PROVIDER_SITE_OTHER): Payer: Medicare Other | Admitting: Internal Medicine

## 2010-08-13 DIAGNOSIS — T887XXA Unspecified adverse effect of drug or medicament, initial encounter: Secondary | ICD-10-CM

## 2010-08-13 DIAGNOSIS — E785 Hyperlipidemia, unspecified: Secondary | ICD-10-CM

## 2010-08-13 LAB — HEPATIC FUNCTION PANEL
ALT: 11 U/L (ref 0–53)
Albumin: 3.9 g/dL (ref 3.5–5.2)
Bilirubin, Direct: 0.1 mg/dL (ref 0.0–0.3)
Total Protein: 5.9 g/dL — ABNORMAL LOW (ref 6.0–8.3)

## 2010-08-13 LAB — LIPID PANEL
Cholesterol: 199 mg/dL (ref 0–200)
HDL: 46.8 mg/dL (ref >39.00)
Triglycerides: 73 mg/dL (ref 0.0–149.0)

## 2010-08-20 ENCOUNTER — Ambulatory Visit: Payer: Self-pay | Admitting: Internal Medicine

## 2010-08-26 ENCOUNTER — Encounter: Payer: Self-pay | Admitting: Internal Medicine

## 2010-09-03 ENCOUNTER — Encounter: Payer: Self-pay | Admitting: Internal Medicine

## 2010-09-03 ENCOUNTER — Ambulatory Visit (INDEPENDENT_AMBULATORY_CARE_PROVIDER_SITE_OTHER): Payer: Medicare Other | Admitting: Internal Medicine

## 2010-09-03 VITALS — BP 130/72 | HR 76 | Temp 98.2°F | Resp 16 | Ht 70.0 in | Wt 232.0 lb

## 2010-09-03 DIAGNOSIS — I10 Essential (primary) hypertension: Secondary | ICD-10-CM

## 2010-09-03 DIAGNOSIS — E785 Hyperlipidemia, unspecified: Secondary | ICD-10-CM

## 2010-09-03 DIAGNOSIS — G4700 Insomnia, unspecified: Secondary | ICD-10-CM

## 2010-09-03 MED ORDER — ALBUTEROL SULFATE HFA 108 (90 BASE) MCG/ACT IN AERS: 1.0000 | INHALATION_SPRAY | Freq: Four times a day (QID) | RESPIRATORY_TRACT | Status: DC | PRN

## 2010-09-03 MED ORDER — TAMSULOSIN HCL 0.4 MG PO CAPS: 0.4000 mg | ORAL_CAPSULE | Freq: Every day | ORAL | Status: DC

## 2010-09-03 MED ORDER — ZOLPIDEM TARTRATE 10 MG PO TABS: 10.0000 mg | ORAL_TABLET | Freq: Every evening | ORAL | Status: DC | PRN

## 2010-09-03 NOTE — Assessment & Plan Note (Signed)
The patient needs a refill on his sleeping medications

## 2010-09-03 NOTE — Assessment & Plan Note (Signed)
The patient has been on pulse Crestor therapy with 20 mg of Crestor once a week his cholesterol went from 149-199 his Ticlid trazodone 73 his HDL remains high at 46.8 and he is now at goal and liver functions are completely normal he is tolerating this medication indicating that this is a acceptable risk intervention for this pleasant individual with risk factors of hyperlipidemia age and prediabetic condition

## 2010-09-03 NOTE — Progress Notes (Signed)
  Subjective:    Patient ID: [name], male    DOB: 21-Oct-1926, 77 y.o.   MRN: [id number]  HPI Patient is a pleasant 77 year old white male presents for followup of hyperlipidemia hypertension and asthma he needs a refill of his asthmatic medications but has been stable and he has a minimal use of his rescue inhaler. He's been on pulse dose Crestor with good result with a significant reduction in his total cholesterol and LDL his hypertension has been stable on his current medications he has no shortness of breath PND orthopnea or chest pain   Review of Systems  Constitutional: Negative for fever and fatigue.  HENT: Negative for hearing loss, congestion, neck pain and postnasal drip.   Eyes: Negative for discharge, redness and visual disturbance.  Respiratory: Negative for cough, shortness of breath and wheezing.   Cardiovascular: Negative for leg swelling.  Gastrointestinal: Negative for abdominal pain, constipation and abdominal distention.  Genitourinary: Negative for urgency and frequency.  Musculoskeletal: Negative for joint swelling and arthralgias.  Skin: Negative for color change and rash.  Neurological: Negative for weakness and light-headedness.  Hematological: Negative for adenopathy.  Psychiatric/Behavioral: Negative for behavioral problems.   Past Medical History  Diagnosis Date  . Obesity   . Asthma   . Diabetes mellitus   . Hypertension   . Edema   . BPH (benign prostatic hypertrophy)    History reviewed. No pertinent past surgical history.  reports that he quit smoking about 46 years ago. He does not have any smokeless tobacco history on file. He reports that he does not drink alcohol or use illicit drugs. family history includes Alzheimer's disease in his mother and Heart disease in his father. No Known Allergies     Objective:   Physical Exam  Constitutional: He appears well-developed and well-nourished.  HENT:  Head: Normocephalic and atraumatic.    Eyes: Conjunctivae are normal. Pupils are equal, round, and reactive to light.  Neck: Normal range of motion. Neck supple.  Cardiovascular: Normal rate and regular rhythm.   Pulmonary/Chest: Effort normal and breath sounds normal.  Abdominal: Soft. Bowel sounds are normal.          Assessment & Plan:

## 2010-09-03 NOTE — Assessment & Plan Note (Signed)
Stable blood pressure 

## 2010-12-28 ENCOUNTER — Ambulatory Visit (INDEPENDENT_AMBULATORY_CARE_PROVIDER_SITE_OTHER): Payer: Medicare Other | Admitting: Internal Medicine

## 2010-12-28 ENCOUNTER — Encounter: Payer: Self-pay | Admitting: Internal Medicine

## 2010-12-28 DIAGNOSIS — E785 Hyperlipidemia, unspecified: Secondary | ICD-10-CM

## 2010-12-28 DIAGNOSIS — I10 Essential (primary) hypertension: Secondary | ICD-10-CM

## 2010-12-28 DIAGNOSIS — Z79899 Other long term (current) drug therapy: Secondary | ICD-10-CM

## 2010-12-28 DIAGNOSIS — N400 Enlarged prostate without lower urinary tract symptoms: Secondary | ICD-10-CM

## 2010-12-28 DIAGNOSIS — J45991 Cough variant asthma: Secondary | ICD-10-CM

## 2010-12-28 DIAGNOSIS — E8881 Metabolic syndrome: Secondary | ICD-10-CM

## 2010-12-28 LAB — LIPID PANEL
HDL: 54.3 mg/dL (ref >39.00)
LDL Cholesterol: 112 mg/dL — ABNORMAL HIGH (ref 0–99)
Total CHOL/HDL Ratio: 3
VLDL: 16 mg/dL (ref 0.0–40.0)

## 2010-12-28 LAB — BASIC METABOLIC PANEL
BUN: 32 mg/dL — ABNORMAL HIGH (ref 6–23)
CO2: 29 mEq/L (ref 19–32)
Calcium: 9.1 mg/dL (ref 8.4–10.5)
Chloride: 98 mEq/L (ref 96–112)
Creatinine, Ser: 0.9 mg/dL (ref 0.4–1.5)
Glucose, Bld: 108 mg/dL — ABNORMAL HIGH (ref 70–99)

## 2010-12-28 LAB — LDL CHOLESTEROL, DIRECT: Direct LDL: 122 mg/dL

## 2010-12-28 NOTE — Patient Instructions (Signed)
-   Weight loss 

## 2010-12-28 NOTE — Progress Notes (Signed)
  Subjective:    Patient ID: [name], male    DOB: [birth year], 77 y.o.   MRN: [id number]  HPI  monitoring his response to Crestor on pulse therapy once weekly  Psoas placed on pulse therapy with Crestor 20 mg tablets one a week.  He is also followed for hypertension on Maxzide 75  one half tablet a day.  He is mild to moderate asthmatic bronchitis uses a rescue inhaler.... using once or twice a week He has no excessive shortness of breath... he has cough mediated asthma Review of Systems  Constitutional: Negative for fever and fatigue.  HENT: Negative for hearing loss, congestion, neck pain and postnasal drip.   Eyes: Negative for discharge, redness and visual disturbance.  Respiratory: Negative for cough, shortness of breath and wheezing.   Cardiovascular: Negative for leg swelling.  Gastrointestinal: Negative for abdominal pain, constipation and abdominal distention.  Genitourinary: Negative for urgency and frequency.  Musculoskeletal: Negative for joint swelling and arthralgias.  Skin: Negative for color change and rash.  Neurological: Negative for weakness and light-headedness.  Hematological: Negative for adenopathy.  Psychiatric/Behavioral: Negative for behavioral problems.   Past Medical History  Diagnosis Date  . Obesity   . Asthma   . Diabetes mellitus   . Hypertension   . Edema   . BPH (benign prostatic hypertrophy)    History reviewed. No pertinent past surgical history.  reports that he quit smoking about 46 years ago. He does not have any smokeless tobacco history on file. He reports that he does not drink alcohol or use illicit drugs. family history includes Alzheimer's disease in his mother and Heart disease in his father. No Known Allergies     Objective:   Physical Exam  Nursing note and vitals reviewed. Constitutional: He appears well-developed and well-nourished.  HENT:  Head: Normocephalic and atraumatic.  Eyes: Conjunctivae are normal.  Pupils are equal, round, and reactive to light.  Neck: Normal range of motion. Neck supple.  Cardiovascular: Normal rate and regular rhythm.   Pulmonary/Chest: Effort normal and breath sounds normal.  Abdominal: Soft. Bowel sounds are normal.          Assessment & Plan:  Weight is stable... But did not loose the desired ... reviewed need for weight loss  Reviewed prediabetic.....risk and weight  Relationship

## 2011-02-19 ENCOUNTER — Ambulatory Visit (INDEPENDENT_AMBULATORY_CARE_PROVIDER_SITE_OTHER): Payer: Medicare Other

## 2011-02-19 DIAGNOSIS — Z23 Encounter for immunization: Secondary | ICD-10-CM

## 2011-03-06 ENCOUNTER — Other Ambulatory Visit: Payer: Self-pay | Admitting: Internal Medicine

## 2011-03-10 ENCOUNTER — Telehealth: Payer: Self-pay | Admitting: Internal Medicine

## 2011-03-10 ENCOUNTER — Other Ambulatory Visit: Payer: Self-pay | Admitting: Internal Medicine

## 2011-03-10 NOTE — Telephone Encounter (Signed)
Pt called and said that he checked with [facility] on [address] and was told that his zolpidem (AMBIEN) 10 MG tablet had not been rcvd yet. Pls call pharmacy.

## 2011-03-10 NOTE — Telephone Encounter (Signed)
Called in again

## 2011-03-11 ENCOUNTER — Other Ambulatory Visit: Payer: Self-pay | Admitting: Internal Medicine

## 2011-03-11 NOTE — Telephone Encounter (Signed)
Pt need generic zolpidem 10 mg [facility] [address] [phone]

## 2011-03-11 NOTE — Telephone Encounter (Signed)
Pharmacy requesting refill on zolpidem (AMBIEN) 10 MG tablet  [facility] [address]

## 2011-03-11 NOTE — Telephone Encounter (Signed)
For the 4th time it was called in and I spoke to the pharmacist-pt informed

## 2011-04-29 ENCOUNTER — Encounter: Payer: Self-pay | Admitting: Internal Medicine

## 2011-04-29 ENCOUNTER — Ambulatory Visit (INDEPENDENT_AMBULATORY_CARE_PROVIDER_SITE_OTHER): Payer: Medicare Other | Admitting: Internal Medicine

## 2011-04-29 VITALS — BP 110/70 | HR 76 | Temp 98.3°F | Resp 16 | Ht 70.0 in | Wt 230.0 lb

## 2011-04-29 DIAGNOSIS — T887XXA Unspecified adverse effect of drug or medicament, initial encounter: Secondary | ICD-10-CM

## 2011-04-29 DIAGNOSIS — M25561 Pain in right knee: Secondary | ICD-10-CM

## 2011-04-29 DIAGNOSIS — M25569 Pain in unspecified knee: Secondary | ICD-10-CM

## 2011-04-29 DIAGNOSIS — E119 Type 2 diabetes mellitus without complications: Secondary | ICD-10-CM

## 2011-04-29 DIAGNOSIS — Z79899 Other long term (current) drug therapy: Secondary | ICD-10-CM

## 2011-04-29 DIAGNOSIS — E785 Hyperlipidemia, unspecified: Secondary | ICD-10-CM

## 2011-04-29 DIAGNOSIS — M1710 Unilateral primary osteoarthritis, unspecified knee: Secondary | ICD-10-CM

## 2011-04-29 LAB — LIPID PANEL
Cholesterol: 205 mg/dL — ABNORMAL HIGH (ref 0–200)
Total CHOL/HDL Ratio: 4
Triglycerides: 89 mg/dL (ref 0.0–149.0)

## 2011-04-29 LAB — HEPATIC FUNCTION PANEL
AST: 15 U/L (ref 0–37)
Alkaline Phosphatase: 51 U/L (ref 39–117)
Bilirubin, Direct: 0.1 mg/dL (ref 0.0–0.3)
Total Protein: 6.4 g/dL (ref 6.0–8.3)

## 2011-04-29 MED ORDER — METHYLPREDNISOLONE ACETATE 40 MG/ML IJ SUSP: 40.0000 mg | Freq: Once | INTRAMUSCULAR | Status: DC

## 2011-04-29 NOTE — Patient Instructions (Addendum)
Weight loss goal of 10 pounds before next office visit  The patient is instructed to continue all medications as prescribed. Schedule followup with check out clerk upon leaving the clinic.  You have received a steroid injection into a joint space. It will take up to 48 hours before you notice a difference in the pain in the joint. For the next few hours keep ice on the site of the injection. Do not exert the injected joint for the next 24 hours.

## 2011-04-29 NOTE — Progress Notes (Signed)
Subjective:    Patient ID: [name], male    DOB: 27-Sep-1926, 77 y.o.   MRN: [id number]  HPI Increased pain in the knees with a recent hx of the left knee He is willing to have a knee injection as a trial to see if he can increase his mobility His blood pressure is stable And he is to to have his lipids monitoring today    Review of Systems  Constitutional: Negative for fever and fatigue.  HENT: Negative for hearing loss, congestion, neck pain and postnasal drip.   Eyes: Negative for discharge, redness and visual disturbance.  Respiratory: Negative for cough, shortness of breath and wheezing.   Cardiovascular: Negative for leg swelling.  Gastrointestinal: Negative for abdominal pain, constipation and abdominal distention.  Genitourinary: Negative for urgency and frequency.  Musculoskeletal: Positive for myalgias, joint swelling and gait problem. Negative for arthralgias.  Skin: Negative for color change and rash.  Neurological: Negative for weakness and light-headedness.  Hematological: Negative for adenopathy.  Psychiatric/Behavioral: Negative for behavioral problems.   Past Medical History  Diagnosis Date  . Obesity   . Asthma   . Diabetes mellitus   . Hypertension   . Edema   . BPH (benign prostatic hypertrophy)     History   Social History  . Marital Status: Married    Spouse Name: N/A    Number of Children: N/A  . Years of Education: N/A   Occupational History  . retired    Social History Main Topics  . Smoking status: Former Smoker    Quit date: 05/17/1964  . Smokeless tobacco: Not on file  . Alcohol Use: No  . Drug Use: No  . Sexually Active: Not Currently   Other Topics Concern  . Not on file   Social History Narrative  . No narrative on file    No past surgical history on file.  Family History  Problem Relation Age of Onset  . Alzheimer's disease Mother   . Heart disease Father     No Known Allergies  Current Outpatient  Prescriptions on File Prior to Visit  Medication Sig Dispense Refill  . albuterol (PROVENTIL HFA;VENTOLIN HFA) 108 (90 BASE) MCG/ACT inhaler Inhale 1 puff into the lungs every 6 (six) hours as needed for wheezing.  1 Inhaler  6  . aspirin 81 MG tablet Take 81 mg by mouth daily.        . Multiple Vitamins-Minerals (OCUVITE PRESERVISION) TABS Take 2 tablets by mouth daily.        . rosuvastatin (CRESTOR) 20 MG tablet Take 20 mg by mouth once a week.       . Tamsulosin HCl (FLOMAX) 0.4 MG CAPS Take 1 capsule (0.4 mg total) by mouth daily.  60 capsule  6  . triamterene-hydrochlorothiazide (MAXZIDE) 75-50 MG per tablet TAKE ONE TABLET BY MOUTH EVERY DAY  30 tablet  11  . zolpidem (AMBIEN) 10 MG tablet TAKE ONE TABLET BY MOUTH EVERY DAY AT BEDTIME AS NEEDED  30 tablet  5   No current facility-administered medications on file prior to visit.    BP 110/70  Pulse 76  Temp 98.3 F (36.8 C)  Resp 16  Ht 5\' 10"  (1.778 m)  Wt 230 lb (104.327 kg)  BMI 33.00 kg/m2       Objective:   Physical Exam  Nursing note reviewed. Constitutional: He appears well-developed and well-nourished.  HENT:  Head: Normocephalic and atraumatic.  Eyes: Conjunctivae are normal. Pupils are equal, round,  and reactive to light.  Neck: Normal range of motion. Neck supple.  Cardiovascular: Normal rate and regular rhythm.   Pulmonary/Chest: Effort normal and breath sounds normal.  Abdominal: Soft. Bowel sounds are normal.  Musculoskeletal: He exhibits edema and tenderness.          Assessment & Plan:    patient's hypertension sugars have been well controlled and his weight is stable at hemoglobin A1c will be drawn today his lipids are monitored today with a basic lipid panel and LDL direct and a liver panel to monitor side effects of drugs.  He has severe degenerative arthritis of his knees bilaterally worse in his left  than his right he gave informed consent for a knee injection into his left knee   Informed  consent obtained and the patient's knee was prepped with betadine. Local anesthesia was obtained with topical spray. Then 40 mg of Depo-Medrol and 1/2 cc of lidocaine was injected into the joint space. The patient tolerated the procedure without complications. Post injection care discussed with patient.

## 2011-08-30 ENCOUNTER — Ambulatory Visit (INDEPENDENT_AMBULATORY_CARE_PROVIDER_SITE_OTHER): Payer: Medicare Other | Admitting: Internal Medicine

## 2011-08-30 ENCOUNTER — Encounter: Payer: Self-pay | Admitting: Internal Medicine

## 2011-08-30 VITALS — BP 110/70 | HR 68 | Temp 98.2°F | Resp 16 | Ht 70.0 in | Wt 220.0 lb

## 2011-08-30 DIAGNOSIS — M1710 Unilateral primary osteoarthritis, unspecified knee: Secondary | ICD-10-CM

## 2011-08-30 DIAGNOSIS — I10 Essential (primary) hypertension: Secondary | ICD-10-CM

## 2011-08-30 DIAGNOSIS — E785 Hyperlipidemia, unspecified: Secondary | ICD-10-CM

## 2011-08-30 LAB — BASIC METABOLIC PANEL
BUN: 22 mg/dL (ref 6–23)
Creatinine, Ser: 0.8 mg/dL (ref 0.4–1.5)
GFR: 101.2 mL/min (ref >60.00)
Glucose, Bld: 102 mg/dL — ABNORMAL HIGH (ref 70–99)
Potassium: 4 mEq/L (ref 3.5–5.1)

## 2011-08-30 MED ORDER — TRIAMTERENE-HCTZ 37.5-25 MG PO TABS: 0.5000 | ORAL_TABLET | Freq: Every day | ORAL | Status: DC

## 2011-08-30 MED ORDER — METHYLPREDNISOLONE ACETATE 40 MG/ML IJ SUSP: 40.0000 mg | Freq: Once | INTRAMUSCULAR | Status: DC

## 2011-08-30 MED ORDER — ZOLPIDEM TARTRATE 10 MG PO TABS: 10.0000 mg | ORAL_TABLET | Freq: Every evening | ORAL | Status: DC | PRN

## 2011-08-30 NOTE — Patient Instructions (Signed)
The patient is instructed to continue all medications as prescribed. Schedule followup with check out clerk upon leaving the clinic  

## 2011-08-30 NOTE — Progress Notes (Signed)
Subjective:    Patient ID: [name], male    DOB: 04-19-1927, 78 y.o.   MRN: [id number]  HPI Pt is followed for hypertension and hyperlipidemia. He has an injection in the Left Hip in Jan that successfully controlled pain for several months. He has mild recurrent siatica.  His Remus Loffler continues to aid sleep on a PRN basis Pr has lost 10 lbs! This has helped his knee pain    Review of Systems  Constitutional: Negative for fever and fatigue.  HENT: Negative for hearing loss, congestion, neck pain and postnasal drip.   Eyes: Negative for discharge, redness and visual disturbance.  Respiratory: Negative for cough, shortness of breath and wheezing.   Cardiovascular: Negative for leg swelling.  Gastrointestinal: Negative for abdominal pain, constipation and abdominal distention.  Genitourinary: Negative for urgency and frequency.  Musculoskeletal: Negative for joint swelling and arthralgias.  Skin: Negative for color change and rash.  Neurological: Negative for weakness and light-headedness.  Hematological: Negative for adenopathy.  Psychiatric/Behavioral: Negative for behavioral problems.       Past Medical History  Diagnosis Date  . Obesity   . Asthma   . Diabetes mellitus   . Hypertension   . Edema   . BPH (benign prostatic hypertrophy)     History   Social History  . Marital Status: Married    Spouse Name: N/A    Number of Children: N/A  . Years of Education: N/A   Occupational History  . retired    Social History Main Topics  . Smoking status: Former Smoker    Quit date: 05/17/1964  . Smokeless tobacco: Not on file  . Alcohol Use: No  . Drug Use: No  . Sexually Active: Not Currently   Other Topics Concern  . Not on file   Social History Narrative  . No narrative on file    No past surgical history on file.  Family History  Problem Relation Age of Onset  . Alzheimer's disease Mother   . Heart disease Father     No Known  Allergies  Current Outpatient Prescriptions on File Prior to Visit  Medication Sig Dispense Refill  . albuterol (PROVENTIL HFA;VENTOLIN HFA) 108 (90 BASE) MCG/ACT inhaler Inhale 1 puff into the lungs every 6 (six) hours as needed for wheezing.  1 Inhaler  6  . aspirin 81 MG tablet Take 81 mg by mouth daily.        . Multiple Vitamins-Minerals (OCUVITE PRESERVISION) TABS Take 2 tablets by mouth daily.        . rosuvastatin (CRESTOR) 20 MG tablet Take 20 mg by mouth once a week.       . Tamsulosin HCl (FLOMAX) 0.4 MG CAPS Take 1 capsule (0.4 mg total) by mouth daily.  60 capsule  6  . DISCONTD: triamterene-hydrochlorothiazide (MAXZIDE) 75-50 MG per tablet TAKE ONE TABLET BY MOUTH EVERY DAY  30 tablet  11  . DISCONTD: zolpidem (AMBIEN) 10 MG tablet TAKE ONE TABLET BY MOUTH EVERY DAY AT BEDTIME AS NEEDED  30 tablet  5   Current Facility-Administered Medications on File Prior to Visit  Medication Dose Route Frequency Provider Last Rate Last Dose  . methylPREDNISolone acetate (DEPO-MEDROL) injection 40 mg  40 mg Intra-articular Once Stacie Glaze, MD        BP 110/70  Pulse 68  Temp 98.2 F (36.8 C)  Resp 16  Ht 5\' 10"  (1.778 m)  Wt 220 lb (99.791 kg)  BMI 31.57 kg/m2  Objective:   Physical Exam  Constitutional: He appears well-developed and well-nourished.  HENT:  Head: Normocephalic and atraumatic.  Eyes: Conjunctivae are normal. Pupils are equal, round, and reactive to light.  Neck: Normal range of motion. Neck supple.  Cardiovascular: Normal rate and regular rhythm.   Pulmonary/Chest: Effort normal and breath sounds normal.  Abdominal: Soft. Bowel sounds are normal.          Assessment & Plan:   Informed consent obtained and the patient's left  knee was prepped with betadine. Local anesthesia was obtained with topical spray. Then 40 mg of Depo-Medrol and 1/2 cc of lidocaine was injected into the joint space. The patient tolerated the procedure without complications. Post  injection care discussed with patient. This will be the second in 3 possible knee injection. We discussed Synvisc as a next up since the patient is hesitant to proceed with knee replacement.  The patient has experienced some swimmy headed symptoms on the Maxide 75 we will decrease the dose to Maxide 25 one half tablet daily so he has consistent control of both blood pressure and edema.  He will continue his Crestor 20 mg by mouth weekly.

## 2011-09-06 ENCOUNTER — Other Ambulatory Visit: Payer: Self-pay | Admitting: Internal Medicine

## 2011-11-01 ENCOUNTER — Telehealth: Payer: Self-pay | Admitting: Internal Medicine

## 2011-11-01 NOTE — Telephone Encounter (Signed)
Pt is having abd pain decline to see another provider. Pt is requesting bonnye to return his call.

## 2011-11-01 NOTE — Telephone Encounter (Signed)
Talked with [name] and he has abd pain that has been going on for about 1 month but now is getting worse- was given appointment with dr Kirtland Bouchard for tomorrow-[name] aware

## 2011-11-02 ENCOUNTER — Ambulatory Visit (INDEPENDENT_AMBULATORY_CARE_PROVIDER_SITE_OTHER): Payer: Medicare Other | Admitting: Internal Medicine

## 2011-11-02 ENCOUNTER — Encounter: Payer: Self-pay | Admitting: Internal Medicine

## 2011-11-02 VITALS — BP 90/60 | Temp 98.3°F | Wt 212.0 lb

## 2011-11-02 DIAGNOSIS — E8881 Metabolic syndrome: Secondary | ICD-10-CM

## 2011-11-02 DIAGNOSIS — I10 Essential (primary) hypertension: Secondary | ICD-10-CM

## 2011-11-02 DIAGNOSIS — Z860100 Personal history of colon polyps, unspecified: Secondary | ICD-10-CM

## 2011-11-02 HISTORY — DX: Personal history of colon polyps, unspecified: Z86.0100

## 2011-11-02 HISTORY — DX: Personal history of colonic polyps: Z86.010

## 2011-11-02 LAB — CBC WITH DIFFERENTIAL/PLATELET
Basophils Relative: 0.6 % (ref 0.0–3.0)
Eosinophils Relative: 1.1 % (ref 0.0–5.0)
HCT: 35.9 % — ABNORMAL LOW (ref 39.0–52.0)
Hemoglobin: 11.9 g/dL — ABNORMAL LOW (ref 13.0–17.0)
Lymphs Abs: 1.7 10*3/uL (ref 0.7–4.0)
Monocytes Relative: 8.5 % (ref 3.0–12.0)
Neutro Abs: 4.6 10*3/uL (ref 1.4–7.7)
WBC: 7 10*3/uL (ref 4.5–10.5)

## 2011-11-02 LAB — COMPREHENSIVE METABOLIC PANEL
ALT: 9 U/L (ref 0–53)
Alkaline Phosphatase: 45 U/L (ref 39–117)
CO2: 26 mEq/L (ref 19–32)
Creatinine, Ser: 0.8 mg/dL (ref 0.4–1.5)
GFR: 92.86 mL/min (ref >60.00)
Glucose, Bld: 111 mg/dL — ABNORMAL HIGH (ref 70–99)
Total Bilirubin: 0.3 mg/dL (ref 0.3–1.2)

## 2011-11-02 NOTE — Patient Instructions (Signed)
  Hold diuretic therapy  Drink as much fluid as you  can tolerate over the next few days  Recheck 2 weeks  CALL  If  clinical worsening  High fiber diet  Return slides to check for hidden blood

## 2011-11-02 NOTE — Progress Notes (Signed)
  Subjective:    Patient ID: [name], male    DOB: 1926/09/22, 78 y.o.   MRN: [id number]  HPI  78 year old patient who presents with a chief complaint of abdominal pain. This has been present for 4-6 weeks. Symptoms seem to intensify in the late afternoon. The pain is described as a aching type of discomfort.  No new medications. He takes Crestor once weekly for dyslipidemia. He is on diuretic therapy for what he states is peripheral edema. He does have a history of hypertension. Blood pressure today is 90/60. He describes the pain as very mild  1-2/10 but has intensified slightly. There's been a modest involuntary weight loss. No change in appetite or other constitutional complaints.  He states he has a history of colonic polyps and last colonoscopy was 3-4 years ago  Review of Systems  Constitutional: Negative for fever, chills, appetite change and fatigue.  HENT: Negative for hearing loss, ear pain, congestion, sore throat, trouble swallowing, neck stiffness, dental problem, voice change and tinnitus.   Eyes: Negative for pain, discharge and visual disturbance.  Respiratory: Negative for cough, chest tightness, wheezing and stridor.   Cardiovascular: Negative for chest pain, palpitations and leg swelling.  Gastrointestinal: Positive for abdominal pain. Negative for nausea, vomiting, diarrhea, constipation, blood in stool and abdominal distention.  Genitourinary: Negative for urgency, hematuria, flank pain, discharge, difficulty urinating and genital sores.  Musculoskeletal: Negative for myalgias, back pain, joint swelling, arthralgias and gait problem.  Skin: Negative for rash.  Neurological: Negative for dizziness, syncope, speech difficulty, weakness, numbness and headaches.  Hematological: Negative for adenopathy. Does not bruise/bleed easily.  Psychiatric/Behavioral: Negative for behavioral problems and dysphoric mood. The patient is not nervous/anxious.        Objective:   Physical Exam  Constitutional: He is oriented to person, place, and time. He appears well-developed and well-nourished. No distress.       Weight 212 Blood pressure 90/50  HENT:  Head: Normocephalic.  Right Ear: External ear normal.  Left Ear: External ear normal.  Eyes: Conjunctivae and EOM are normal.  Neck: Normal range of motion.  Cardiovascular: Normal rate and normal heart sounds.   Pulmonary/Chest: Breath sounds normal.  Abdominal: Bowel sounds are normal. He exhibits no distension and no mass. There is no tenderness. There is no rebound and no guarding.  Musculoskeletal: Normal range of motion. He exhibits no edema and no tenderness.  Neurological: He is alert and oriented to person, place, and time.  Psychiatric: He has a normal mood and affect. His behavior is normal.          Assessment & Plan:   Abdominal pain unclear etiology. Do to hypotension need to be concerned about intestinal hypoperfusion History of hypertension and pedal edema. Will hold diuretic therapy at this time Dyslipidemia samples of Crestor dispensed  Will check laboratory update Recheck 2 weeks Will check stool for occult blood

## 2011-11-25 ENCOUNTER — Other Ambulatory Visit (INDEPENDENT_AMBULATORY_CARE_PROVIDER_SITE_OTHER): Payer: Medicare Other

## 2011-11-25 DIAGNOSIS — D649 Anemia, unspecified: Secondary | ICD-10-CM

## 2011-12-30 ENCOUNTER — Encounter: Payer: Self-pay | Admitting: Internal Medicine

## 2011-12-30 ENCOUNTER — Ambulatory Visit (INDEPENDENT_AMBULATORY_CARE_PROVIDER_SITE_OTHER)
Admission: RE | Admit: 2011-12-30 | Discharge: 2011-12-30 | Disposition: A | Discharge status: Home / Self Care | Payer: Medicare Other | Source: Ambulatory Visit | Attending: Internal Medicine | Admitting: Internal Medicine

## 2011-12-30 ENCOUNTER — Ambulatory Visit (INDEPENDENT_AMBULATORY_CARE_PROVIDER_SITE_OTHER): Payer: Medicare Other | Admitting: Internal Medicine

## 2011-12-30 VITALS — BP 120/70 | HR 72 | Temp 98.2°F | Resp 16 | Ht 70.0 in | Wt 208.0 lb

## 2011-12-30 DIAGNOSIS — M549 Dorsalgia, unspecified: Secondary | ICD-10-CM

## 2011-12-30 DIAGNOSIS — R52 Pain, unspecified: Secondary | ICD-10-CM

## 2011-12-30 DIAGNOSIS — Z79899 Other long term (current) drug therapy: Secondary | ICD-10-CM

## 2011-12-30 DIAGNOSIS — D649 Anemia, unspecified: Secondary | ICD-10-CM

## 2011-12-30 DIAGNOSIS — R1032 Left lower quadrant pain: Secondary | ICD-10-CM

## 2011-12-30 DIAGNOSIS — R1031 Right lower quadrant pain: Secondary | ICD-10-CM

## 2011-12-30 LAB — VITAMIN B12: Vitamin B-12: 465 pg/mL (ref 211–911)

## 2011-12-30 MED ORDER — OXYCODONE-ACETAMINOPHEN 5-325 MG PO TABS: 1.0000 | ORAL_TABLET | Freq: Three times a day (TID) | ORAL | Status: DC | PRN

## 2011-12-30 NOTE — Progress Notes (Signed)
Subjective:    Patient ID: [name], male    DOB: November 12, 1926, 78 y.o.   MRN: [id number]  HPI The pain has eased is experiencing mid back pain and abdominal pain he rates the pain now as a 4 and is increasing in intensity the pain is described as constant. For exercise Tylenol helps the pain he cannot tell that the pain changes significantly  with bowel movements. At one dark stool after a laxative but most also been normal in color He saw my partner who ordered a CBC differential which showed some mild anemia stool studies were negative for colon blood and a basic metabolic panel only showed an elevated blood glucose and was a nonfasting lab.  Otherwise renal function appeared to be normal the potassium was normal.  The patient has had 2 colonoscopies through the [place] system we do not have the results of those colonoscopies. States that her first colonoscopy to the [place] had polyps the second one was clear. Been approximately 5 years since his last colonoscopy.   HE HAS A HX OF RENAL STONES.     Review of Systems  Constitutional: Positive for fatigue. Negative for fever.  HENT: Negative for hearing loss, congestion, neck pain and postnasal drip.   Eyes: Negative for discharge, redness and visual disturbance.  Respiratory: Negative for cough, shortness of breath and wheezing.   Cardiovascular: Negative for leg swelling.  Gastrointestinal: Positive for abdominal pain. Negative for constipation and abdominal distention.  Genitourinary: Negative for urgency and frequency.  Musculoskeletal: Positive for myalgias and back pain. Negative for joint swelling and arthralgias.  Skin: Negative for color change and rash.  Neurological: Positive for weakness. Negative for light-headedness.  Hematological: Negative for adenopathy.  Psychiatric/Behavioral: Negative for behavioral problems.    The patient is instructed to continue all medications as prescribed. Schedule followup with check out  clerk upon leaving the clinic     Past Medical History  Diagnosis Date  . Obesity   . Asthma   . Diabetes mellitus   . Hypertension   . Edema   . BPH (benign prostatic hypertrophy)     History   Social History  . Marital Status: Married    Spouse Name: N/A    Number of Children: N/A  . Years of Education: N/A   Occupational History  . retired    Social History Main Topics  . Smoking status: Former Smoker    Quit date: 05/17/1964  . Smokeless tobacco: Not on file  . Alcohol Use: No  . Drug Use: No  . Sexually Active: Not Currently   Other Topics Concern  . Not on file   Social History Narrative  . No narrative on file    No past surgical history on file.  Family History  Problem Relation Age of Onset  . Alzheimer's disease Mother   . Heart disease Father     No Known Allergies  Current Outpatient Prescriptions on File Prior to Visit  Medication Sig Dispense Refill  . aspirin 81 MG tablet Take 81 mg by mouth daily.        . Calcium Carbonate-Vitamin D (CALCIUM 600+D) 600-400 MG-UNIT per tablet Take 1 tablet by mouth daily.      . Multiple Vitamins-Minerals (OCUVITE PRESERVISION) TABS Take 2 tablets by mouth daily.        . rosuvastatin (CRESTOR) 20 MG tablet Take 20 mg by mouth once a week.       . Tamsulosin HCl (FLOMAX) 0.4 MG  CAPS TAKE ONE CAPSULE BY MOUTH EVERY DAY  60 capsule  3  . triamterene-hydrochlorothiazide (MAXZIDE-25) 37.5-25 MG per tablet Take 0.5 each (0.5 tablets total) by mouth daily.  30 tablet  3  . zolpidem (AMBIEN) 10 MG tablet Take 1 tablet (10 mg total) by mouth at bedtime as needed for sleep.  30 tablet  5  . DISCONTD: albuterol (PROVENTIL HFA;VENTOLIN HFA) 108 (90 BASE) MCG/ACT inhaler Inhale 1 puff into the lungs every 6 (six) hours as needed for wheezing.  1 Inhaler  6    BP 120/70  Pulse 72  Temp 98.2 F (36.8 C)  Resp 16  Ht 5\' 10"  (1.778 m)  Wt 208 lb (94.348 kg)  BMI 29.84 kg/m2    Objective:   Physical Exam    Constitutional: He appears well-developed and well-nourished.  HENT:  Head: Normocephalic and atraumatic.  Eyes: Conjunctivae are normal. Pupils are equal, round, and reactive to light.  Neck: Normal range of motion. Neck supple.  Cardiovascular: Normal rate and regular rhythm.   Murmur heard. Pulmonary/Chest: Effort normal and breath sounds normal.  Abdominal: Soft. Bowel sounds are normal. There is tenderness.  Musculoskeletal: He exhibits tenderness.          Assessment & Plan:    Patient has low back pain and abdominal pain they may be related we will evaluate this with the thoracic and lumbar plain x-ray looking for compression fracture.  We will also order abdominal ultrasound complete looking at organs as well as looking for possible renal stones looking for edema of the bowel.  His  differential diagnosis includes renal stones, pancreatitis Ischemic bowel and radicular pain. We'll obtain the blood work today the plain films today and the ultrasound as soon as possible we've given him Percocet for pain control until we find the etiology of this pain

## 2011-12-30 NOTE — Patient Instructions (Addendum)
You will get the blood work today I will be a call you tomorrow morning you'll get the x-rays today I will be able to let you know by this afternoon or tomorrow morning we'll get a call about the ultrasound.  I am giving you some percocet  for pain control until we get the etiology of this pain

## 2011-12-31 ENCOUNTER — Ambulatory Visit
Admission: RE | Admit: 2011-12-31 | Discharge: 2011-12-31 | Disposition: A | Discharge status: Home / Self Care | Payer: Medicare Other | Source: Ambulatory Visit | Attending: Internal Medicine | Admitting: Internal Medicine

## 2011-12-31 DIAGNOSIS — R1031 Right lower quadrant pain: Secondary | ICD-10-CM

## 2012-01-04 ENCOUNTER — Telehealth: Payer: Self-pay | Admitting: Internal Medicine

## 2012-01-04 NOTE — Telephone Encounter (Signed)
Patient called stating that he would like a call back with xray and [place] results. Please assist.

## 2012-01-05 ENCOUNTER — Other Ambulatory Visit: Payer: Self-pay | Admitting: Internal Medicine

## 2012-01-05 ENCOUNTER — Other Ambulatory Visit: Payer: Self-pay | Admitting: *Deleted

## 2012-01-05 DIAGNOSIS — M545 Low back pain, unspecified: Secondary | ICD-10-CM

## 2012-01-05 MED ORDER — MORPHINE SULFATE 15 MG PO TABS: 15.0000 mg | ORAL_TABLET | Freq: Four times a day (QID) | ORAL | Status: DC | PRN

## 2012-01-05 NOTE — Telephone Encounter (Signed)
Labs wnl xray djd- per dr Lovell Sheehan- set up for epidural at [facility]

## 2012-01-06 ENCOUNTER — Other Ambulatory Visit: Payer: Self-pay | Admitting: Internal Medicine

## 2012-01-06 DIAGNOSIS — M545 Low back pain, unspecified: Secondary | ICD-10-CM

## 2012-01-07 ENCOUNTER — Other Ambulatory Visit: Payer: Self-pay | Admitting: Internal Medicine

## 2012-01-07 ENCOUNTER — Other Ambulatory Visit: Payer: Self-pay | Admitting: *Deleted

## 2012-01-07 ENCOUNTER — Ambulatory Visit
Admission: RE | Admit: 2012-01-07 | Discharge: 2012-01-07 | Disposition: A | Discharge status: Home / Self Care | Payer: Medicare Other | Source: Ambulatory Visit | Attending: Internal Medicine | Admitting: Internal Medicine

## 2012-01-07 VITALS — BP 110/57 | HR 80

## 2012-01-07 DIAGNOSIS — M549 Dorsalgia, unspecified: Secondary | ICD-10-CM

## 2012-01-07 DIAGNOSIS — R1031 Right lower quadrant pain: Secondary | ICD-10-CM

## 2012-01-07 DIAGNOSIS — M545 Low back pain, unspecified: Secondary | ICD-10-CM

## 2012-01-07 MED ORDER — OXYCODONE-ACETAMINOPHEN 5-325 MG PO TABS: 1.0000 | ORAL_TABLET | Freq: Three times a day (TID) | ORAL | Status: DC | PRN

## 2012-01-07 MED ORDER — METHYLPREDNISOLONE ACETATE 40 MG/ML INJ SUSP (RADIOLOG
120.0000 mg | Freq: Once | INTRAMUSCULAR | Status: AC
  Administered 2012-01-07: 120 mg via EPIDURAL

## 2012-01-07 MED ORDER — IOHEXOL 180 MG/ML  SOLN
1.0000 mL | Freq: Once | INTRAMUSCULAR | Status: AC | PRN
  Administered 2012-01-07: 1 mL via EPIDURAL

## 2012-01-08 ENCOUNTER — Other Ambulatory Visit: Payer: Medicare Other

## 2012-01-11 ENCOUNTER — Telehealth: Payer: Self-pay | Admitting: Internal Medicine

## 2012-01-11 NOTE — Telephone Encounter (Signed)
Caller: Izayiah/Patient; Phone: [phone]; Reason for Call: Pt calling to speak w/Bonnie re test results, please call at your earliest convenience.

## 2012-01-11 NOTE — Telephone Encounter (Signed)
Results of mri given to pt

## 2012-01-18 ENCOUNTER — Inpatient Hospital Stay (HOSPITAL_COMMUNITY)
Admission: EM | Admit: 2012-01-18 | Discharge: 2012-01-24 | DRG: 308 | Disposition: A | Discharge status: Home / Self Care | Payer: Medicare Other | Attending: Internal Medicine | Admitting: Internal Medicine

## 2012-01-18 ENCOUNTER — Encounter (HOSPITAL_COMMUNITY): Payer: Self-pay | Admitting: Emergency Medicine

## 2012-01-18 ENCOUNTER — Emergency Department (HOSPITAL_COMMUNITY): Payer: Medicare Other

## 2012-01-18 ENCOUNTER — Telehealth: Payer: Self-pay | Admitting: Internal Medicine

## 2012-01-18 DIAGNOSIS — E669 Obesity, unspecified: Secondary | ICD-10-CM | POA: Diagnosis present

## 2012-01-18 DIAGNOSIS — R42 Dizziness and giddiness: Secondary | ICD-10-CM | POA: Diagnosis present

## 2012-01-18 DIAGNOSIS — M47817 Spondylosis without myelopathy or radiculopathy, lumbosacral region: Secondary | ICD-10-CM | POA: Diagnosis present

## 2012-01-18 DIAGNOSIS — I959 Hypotension, unspecified: Secondary | ICD-10-CM | POA: Diagnosis present

## 2012-01-18 DIAGNOSIS — N400 Enlarged prostate without lower urinary tract symptoms: Secondary | ICD-10-CM | POA: Diagnosis present

## 2012-01-18 DIAGNOSIS — J310 Chronic rhinitis: Secondary | ICD-10-CM

## 2012-01-18 DIAGNOSIS — M47816 Spondylosis without myelopathy or radiculopathy, lumbar region: Secondary | ICD-10-CM

## 2012-01-18 DIAGNOSIS — N10 Acute tubulo-interstitial nephritis: Secondary | ICD-10-CM | POA: Diagnosis present

## 2012-01-18 DIAGNOSIS — R109 Unspecified abdominal pain: Secondary | ICD-10-CM | POA: Diagnosis present

## 2012-01-18 DIAGNOSIS — D649 Anemia, unspecified: Secondary | ICD-10-CM | POA: Diagnosis present

## 2012-01-18 DIAGNOSIS — I10 Essential (primary) hypertension: Secondary | ICD-10-CM | POA: Diagnosis present

## 2012-01-18 DIAGNOSIS — J45909 Unspecified asthma, uncomplicated: Secondary | ICD-10-CM | POA: Diagnosis present

## 2012-01-18 DIAGNOSIS — I4891 Unspecified atrial fibrillation: Secondary | ICD-10-CM

## 2012-01-18 DIAGNOSIS — M5137 Other intervertebral disc degeneration, lumbosacral region: Secondary | ICD-10-CM

## 2012-01-18 DIAGNOSIS — E785 Hyperlipidemia, unspecified: Secondary | ICD-10-CM

## 2012-01-18 DIAGNOSIS — I5031 Acute diastolic (congestive) heart failure: Secondary | ICD-10-CM | POA: Diagnosis present

## 2012-01-18 DIAGNOSIS — M479 Spondylosis, unspecified: Secondary | ICD-10-CM | POA: Diagnosis present

## 2012-01-18 DIAGNOSIS — I509 Heart failure, unspecified: Secondary | ICD-10-CM | POA: Diagnosis present

## 2012-01-18 DIAGNOSIS — R0789 Other chest pain: Secondary | ICD-10-CM | POA: Diagnosis present

## 2012-01-18 DIAGNOSIS — A498 Other bacterial infections of unspecified site: Secondary | ICD-10-CM | POA: Diagnosis present

## 2012-01-18 HISTORY — DX: Chronic kidney disease, unspecified: N18.9

## 2012-01-18 HISTORY — DX: Unspecified atrial fibrillation: I48.91

## 2012-01-18 HISTORY — DX: Unspecified osteoarthritis, unspecified site: M19.90

## 2012-01-18 HISTORY — DX: Spondylosis without myelopathy or radiculopathy, lumbar region: M47.816

## 2012-01-18 HISTORY — DX: Exercise induced bronchospasm: J45.990

## 2012-01-18 LAB — COMPREHENSIVE METABOLIC PANEL
ALT: 8 U/L (ref 0–53)
AST: 8 U/L (ref 0–37)
CO2: 30 mEq/L (ref 19–32)
Chloride: 103 mEq/L (ref 96–112)
GFR calc non Af Amer: 85 mL/min — ABNORMAL LOW (ref >90)
Potassium: 4.1 mEq/L (ref 3.5–5.1)
Sodium: 143 mEq/L (ref 135–145)
Total Bilirubin: 0.1 mg/dL — ABNORMAL LOW (ref 0.3–1.2)

## 2012-01-18 LAB — CBC WITH DIFFERENTIAL/PLATELET
Basophils Absolute: 0 10*3/uL (ref 0.0–0.1)
HCT: 33 % — ABNORMAL LOW (ref 39.0–52.0)
Lymphocytes Relative: 22 % (ref 12–46)
Monocytes Absolute: 1.1 10*3/uL — ABNORMAL HIGH (ref 0.1–1.0)
Neutro Abs: 6.3 10*3/uL (ref 1.7–7.7)
Neutrophils Relative %: 66 % (ref 43–77)
Platelets: 298 10*3/uL (ref 150–400)
RDW: 14.6 % (ref 11.5–15.5)
WBC: 9.5 10*3/uL (ref 4.0–10.5)

## 2012-01-18 LAB — PRO B NATRIURETIC PEPTIDE: Pro B Natriuretic peptide (BNP): 1909 pg/mL — ABNORMAL HIGH (ref 0–450)

## 2012-01-18 MED ORDER — SODIUM CHLORIDE 0.9 % IJ SOLN
3.0000 mL | Freq: Two times a day (BID) | INTRAMUSCULAR | Status: DC
  Administered 2012-01-18: 3 mL via INTRAVENOUS
  Administered 2012-01-19: 10:00:00 via INTRAVENOUS

## 2012-01-18 MED ORDER — ALBUTEROL SULFATE (5 MG/ML) 0.5% IN NEBU: 2.5000 mg | INHALATION_SOLUTION | Freq: Four times a day (QID) | RESPIRATORY_TRACT | Status: DC | PRN

## 2012-01-18 MED ORDER — PANTOPRAZOLE SODIUM 40 MG PO TBEC
40.0000 mg | DELAYED_RELEASE_TABLET | Freq: Two times a day (BID) | ORAL | Status: DC
  Administered 2012-01-19: 40 mg via ORAL
  Filled 2012-01-18: qty 1

## 2012-01-18 MED ORDER — ASPIRIN 325 MG PO TABS
325.0000 mg | ORAL_TABLET | Freq: Once | ORAL | Status: AC
  Administered 2012-01-18: 325 mg via ORAL
  Filled 2012-01-18: qty 1

## 2012-01-18 MED ORDER — ACETAMINOPHEN 325 MG PO TABS
650.0000 mg | ORAL_TABLET | Freq: Once | ORAL | Status: AC
  Administered 2012-01-18: 650 mg via ORAL
  Filled 2012-01-18: qty 2

## 2012-01-18 MED ORDER — ATORVASTATIN CALCIUM 40 MG PO TABS
40.0000 mg | ORAL_TABLET | Freq: Every day | ORAL | Status: DC
  Administered 2012-01-19 – 2012-01-23 (×5): 40 mg via ORAL
  Filled 2012-01-18 (×6): qty 1

## 2012-01-18 MED ORDER — ACETAMINOPHEN 500 MG PO TABS
1000.0000 mg | ORAL_TABLET | Freq: Four times a day (QID) | ORAL | Status: DC | PRN
  Administered 2012-01-19 – 2012-01-22 (×6): 1000 mg via ORAL
  Filled 2012-01-18 (×4): qty 2
  Filled 2012-01-18: qty 1
  Filled 2012-01-18: qty 2

## 2012-01-18 MED ORDER — ASPIRIN EC 81 MG PO TBEC
81.0000 mg | DELAYED_RELEASE_TABLET | Freq: Every day | ORAL | Status: DC
  Administered 2012-01-19 – 2012-01-21 (×3): 81 mg via ORAL
  Filled 2012-01-18 (×3): qty 1

## 2012-01-18 MED ORDER — DILTIAZEM HCL 100 MG IV SOLR
5.0000 mg/h | INTRAVENOUS | Status: DC
  Filled 2012-01-18: qty 100

## 2012-01-18 MED ORDER — POLYETHYLENE GLYCOL 3350 17 G PO PACK
17.0000 g | PACK | Freq: Every day | ORAL | Status: DC | PRN
  Filled 2012-01-18: qty 1

## 2012-01-18 MED ORDER — SODIUM CHLORIDE 0.9 % IV SOLN: INTRAVENOUS | Status: DC

## 2012-01-18 MED ORDER — TRAZODONE 25 MG HALF TABLET
25.0000 mg | ORAL_TABLET | Freq: Every evening | ORAL | Status: DC | PRN
  Administered 2012-01-19 – 2012-01-23 (×6): 25 mg via ORAL
  Filled 2012-01-18 (×6): qty 1

## 2012-01-18 MED ORDER — GABAPENTIN 100 MG PO CAPS
100.0000 mg | ORAL_CAPSULE | Freq: Three times a day (TID) | ORAL | Status: DC
  Administered 2012-01-18 – 2012-01-24 (×17): 100 mg via ORAL
  Filled 2012-01-18 (×20): qty 1

## 2012-01-18 MED ORDER — DILTIAZEM HCL 100 MG IV SOLR: 5.0000 mg/h | INTRAVENOUS | Status: DC

## 2012-01-18 MED ORDER — FENTANYL CITRATE 0.05 MG/ML IJ SOLN
25.0000 ug | Freq: Once | INTRAMUSCULAR | Status: AC
  Administered 2012-01-18: 25 ug via INTRAVENOUS
  Filled 2012-01-18: qty 2

## 2012-01-18 MED ORDER — HEPARIN BOLUS VIA INFUSION
4500.0000 [IU] | Freq: Once | INTRAVENOUS | Status: AC
  Administered 2012-01-18: 4500 [IU] via INTRAVENOUS
  Filled 2012-01-18: qty 4500

## 2012-01-18 MED ORDER — DILTIAZEM HCL 50 MG/10ML IV SOLN
20.0000 mg | Freq: Once | INTRAVENOUS | Status: AC
  Administered 2012-01-18: 20 mg via INTRAVENOUS
  Filled 2012-01-18: qty 4

## 2012-01-18 MED ORDER — BISACODYL 5 MG PO TBEC
5.0000 mg | DELAYED_RELEASE_TABLET | Freq: Every day | ORAL | Status: DC | PRN
  Administered 2012-01-21: 5 mg via ORAL
  Filled 2012-01-18 (×2): qty 1

## 2012-01-18 MED ORDER — SODIUM CHLORIDE 0.9 % IV BOLUS (SEPSIS)
1000.0000 mL | INTRAVENOUS | Status: AC
  Administered 2012-01-18: 1000 mL via INTRAVENOUS

## 2012-01-18 MED ORDER — ACETAMINOPHEN 500 MG PO TABS
1000.0000 mg | ORAL_TABLET | Freq: Once | ORAL | Status: DC
  Filled 2012-01-18: qty 2

## 2012-01-18 MED ORDER — GI COCKTAIL ~~LOC~~
30.0000 mL | Freq: Once | ORAL | Status: AC
  Administered 2012-01-18: 30 mL via ORAL
  Filled 2012-01-18: qty 30

## 2012-01-18 MED ORDER — DILTIAZEM HCL 100 MG IV SOLR
5.0000 mg/h | INTRAVENOUS | Status: DC
  Administered 2012-01-19 (×2): 15 mg/h via INTRAVENOUS
  Filled 2012-01-18: qty 100

## 2012-01-18 MED ORDER — HEPARIN (PORCINE) IN NACL 100-0.45 UNIT/ML-% IJ SOLN
1800.0000 [IU]/h | INTRAMUSCULAR | Status: DC
  Administered 2012-01-19 (×2): 1450 [IU]/h via INTRAVENOUS
  Administered 2012-01-20 (×2): 1800 [IU]/h via INTRAVENOUS
  Filled 2012-01-18 (×5): qty 250

## 2012-01-18 MED ORDER — FLEET ENEMA 7-19 GM/118ML RE ENEM: 1.0000 | ENEMA | Freq: Once | RECTAL | Status: AC | PRN

## 2012-01-18 MED ORDER — FENTANYL CITRATE 0.05 MG/ML IJ SOLN
INTRAMUSCULAR | Status: AC
  Filled 2012-01-18: qty 2

## 2012-01-18 MED ORDER — FENTANYL CITRATE 0.05 MG/ML IJ SOLN
50.0000 ug | INTRAMUSCULAR | Status: DC | PRN
  Administered 2012-01-18 – 2012-01-19 (×5): 50 ug via INTRAVENOUS
  Filled 2012-01-18 (×4): qty 2

## 2012-01-18 MED ORDER — POTASSIUM CHLORIDE IN NACL 20-0.9 MEQ/L-% IV SOLN
INTRAVENOUS | Status: DC
  Administered 2012-01-19 (×2): via INTRAVENOUS
  Filled 2012-01-18 (×4): qty 1000

## 2012-01-18 NOTE — ED Notes (Signed)
Clarification, pt is on cardizem drip at 10mg /35ml/hour

## 2012-01-18 NOTE — ED Provider Notes (Signed)
History     CSN: [id number]  Arrival date & time 01/18/12  1749   First MD Initiated Contact with Patient 01/18/12 1817      Chief Complaint  Patient presents with  . Hypotension  . Irregular Heart Beat    (Consider location/radiation/quality/duration/timing/severity/associated sxs/prior treatment) Patient is a 78 y.o. male presenting with neurologic complaint. The history is provided by the patient.  Neurologic Problem The primary symptoms include dizziness. Primary symptoms do not include headaches, fever, nausea or vomiting. Episode onset: several weeks ago. Progression since onset: intermittent. The neurological symptoms are focal. Context: typically w/ standing.  He describes the dizziness as lightheadedness. The dizziness began more than 1 week ago. The dizziness has been unchanged since its onset. It is a recurrent problem. Associated with: standing. Dizziness does not occur with nausea or vomiting.    Past Medical History  Diagnosis Date  . Obesity   . Hypertension   . Edema   . BPH (benign prostatic hypertrophy)   . Asthma   . Bronchospasm, exercise-induced   . Chronic kidney disease   . Arthritis     Past Surgical History  Procedure Date  . Vascular surgery removed varicose veins    Family History  Problem Relation Age of Onset  . Alzheimer's disease Mother   . Heart disease Father     History  Substance Use Topics  . Smoking status: Former Smoker    Quit date: 05/17/1964  . Smokeless tobacco: Not on file  . Alcohol Use: No      Review of Systems  Constitutional: Negative for fever.  HENT: Negative for rhinorrhea, drooling and neck pain.   Eyes: Negative for pain.  Respiratory: Negative for cough and shortness of breath.   Cardiovascular: Negative for chest pain and leg swelling.  Gastrointestinal: Negative for nausea, vomiting, abdominal pain and diarrhea.  Genitourinary: Negative for dysuria and hematuria.  Musculoskeletal: Positive for back  pain. Negative for gait problem.  Skin: Negative for color change.  Neurological: Positive for dizziness. Negative for numbness and headaches.  Hematological: Negative for adenopathy.  Psychiatric/Behavioral: Negative for behavioral problems.  All other systems reviewed and are negative.    Allergies  Vicodin and Morphine and related  Home Medications   No current outpatient prescriptions on file.  BP 118/62  Pulse 98  Temp 98.6 F (37 C) (Oral)  Resp 20  Ht 5\' 9"  (1.753 m)  Wt 202 lb 2.6 oz (91.7 kg)  BMI 29.85 kg/m2  SpO2 95%  Physical Exam  Nursing note and vitals reviewed. Constitutional: He is oriented to person, place, and time. He appears well-developed and well-nourished.  HENT:  Head: Normocephalic and atraumatic.  Right Ear: External ear normal.  Left Ear: External ear normal.  Nose: Nose normal.  Mouth/Throat: Oropharynx is clear and moist. No oropharyngeal exudate.  Eyes: Conjunctivae and EOM are normal. Pupils are equal, round, and reactive to light.  Neck: Normal range of motion. Neck supple.  Cardiovascular: Normal heart sounds and intact distal pulses.  Exam reveals no gallop and no friction rub.   No murmur heard.      A fib with rvr, HR 150's  Pulmonary/Chest: Effort normal and breath sounds normal. No respiratory distress. He has no wheezes.  Abdominal: Soft. Bowel sounds are normal. He exhibits no distension. There is tenderness (mild non-specific ttp of lower abdomen). There is no rebound and no guarding.  Musculoskeletal: Normal range of motion. He exhibits edema (moderate edema to bilateral LE's extending to  thighs). He exhibits no tenderness.  Neurological: He is alert and oriented to person, place, and time.  Skin: Skin is warm and dry.  Psychiatric: He has a normal mood and affect. His behavior is normal.    ED Course  Procedures (including critical care time)  Labs Reviewed  PRO B NATRIURETIC PEPTIDE - Abnormal; Notable for the following:     Pro B Natriuretic peptide (BNP) 1909.0 (*)     All other components within normal limits  CBC WITH DIFFERENTIAL - Abnormal; Notable for the following:    RBC 3.45 (*)     Hemoglobin 10.7 (*)     HCT 33.0 (*)     Monocytes Absolute 1.1 (*)     All other components within normal limits  COMPREHENSIVE METABOLIC PANEL - Abnormal; Notable for the following:    Total Bilirubin 0.1 (*)     GFR calc non Af Amer 85 (*)     All other components within normal limits  POCT I-STAT TROPONIN I  MRSA PCR SCREENING  MRSA PCR SCREENING  D-DIMER, QUANTITATIVE  APTT  PROTIME-INR  TSH  CBC  COMPREHENSIVE METABOLIC PANEL  HEPARIN LEVEL (UNFRACTIONATED)   Dg Chest Portable 1 View  01/18/2012  *RADIOLOGY REPORT*  Clinical Data: Hypotension.  Nonsmoker.  PORTABLE CHEST - 1 VIEW  Comparison: None.  Findings: Cardiomegaly.  Tortuous calcified aorta.  No infiltrate, congestive heart failure or pneumothorax.  IMPRESSION: Cardiomegaly.  Tortuous calcified aorta.  No infiltrate, congestive heart failure or pneumothorax.   Original Report Authenticated By: Fuller Canada, M.D.      1. Hypotension   2. Dizziness   3. Chest tightness   4. Atrial fibrillation with RVR      Date: 01/19/2012  Rate: 151  Rhythm: atrial fibrillation with RVR  QRS Axis: left  Intervals: PR 74ms, QTc  ST/T Wave abnormalities: normal  Conduction Disutrbances:none  Narrative Interpretation: A fib w/ RVR, LAFB, incomplete RBBB, no ST or T wave changes cw ischemia  Old EKG Reviewed: none available    MDM  12:50 AM 78 y.o. male w hx of asthma, DM pw intermittent dizziness for several weeks, worse this afternoon. Pt found to have BP sys in 70's at home and HR in 130's. Pt presented to ER per advice from pcp. Pt found to be in a fib w/ RVR, HR in 150's on exam, BP 80'x/70's. Pt mentating well, c/o mild chest tightness and chronic back pain. Will get labs, IVF, pain control, ASA, and cardizem for rate control.   HR now  under better control. Consulted hospitalist for admission.   Clinical Impression 1. Hypotension   2. Dizziness   3. Chest tightness   4. Atrial fibrillation with RVR            Purvis Sheffield, MD 01/19/12 0050

## 2012-01-18 NOTE — Telephone Encounter (Signed)
Caller: [name]/Child  PCP: Darryll Capers (Adults only); CB#: [phone]; Call regarding BP 74/37 pulse 131.  Relates patient was dizzy, lightheaded at 1600.  Other emergent symptoms ruled out.  Not eating well per caller.  ED disposition per Dizziness or Vertigo protocol.  Caller agreed.

## 2012-01-18 NOTE — Progress Notes (Signed)
ANTICOAGULATION CONSULT NOTE - Initial Consult  Pharmacy Consult for heparin Indication: atrial fibrillation  Allergies  Allergen Reactions  . Vicodin (Hydrocodone-Acetaminophen) Other (See Comments)    Unknown reaction many years ago  . Morphine And Related Nausea And Vomiting    Patient Measurements: Height: 5\' 9"  (175.3 cm) Weight: 202 lb 2.6 oz (91.7 kg) IBW/kg (Calculated) : 70.7  Heparin Dosing Weight: 90 kg  Vital Signs: Temp: 98.9 F (37.2 C) (09/03 2231) Temp src: Oral (09/03 2231) BP: 101/50 mmHg (09/03 2231) Pulse Rate: 103  (09/03 2231)  Labs:  Basename 01/18/12 1837  HGB 10.7*  HCT 33.0*  PLT 298  APTT --  LABPROT --  INR --  HEPARINUNFRC --  CREATININE 0.72  CKTOTAL --  CKMB --  TROPONINI --    Estimated Creatinine Clearance: 79.6 ml/min (by C-G formula based on Cr of 0.72).   Medical History: Past Medical History  Diagnosis Date  . Obesity   . Hypertension   . Edema   . BPH (benign prostatic hypertrophy)   . Asthma   . Bronchospasm, exercise-induced   . Chronic kidney disease   . Arthritis     Medications:  Scheduled:    . acetaminophen  650 mg Oral Once  . aspirin EC  81 mg Oral Daily  . aspirin  325 mg Oral Once  . atorvastatin  40 mg Oral q1800  . diltiazem  20 mg Intravenous Once  . fentaNYL      . fentaNYL  25 mcg Intravenous Once  . gabapentin  100 mg Oral TID  . gi cocktail  30 mL Oral Once  . pantoprazole  40 mg Oral BID AC  . sodium chloride  1,000 mL Intravenous STAT  . sodium chloride  3 mL Intravenous Q12H  . DISCONTD: sodium chloride   Intravenous STAT  . DISCONTD: acetaminophen  1,000 mg Oral Once    Assessment: 78 yo male admitted with atrial fibrillation. Pharmacy consulted to manage IV heparin.   Goal of Therapy:  Heparin level 0.3-0.7 units/ml Monitor platelets by anticoagulation protocol: Yes   Plan:  1. Heparin 4500 units IV x 1, then IV infusion of 1450 units/hr.  2. Heparin level in 8 hours.  3.  Daily CBC, heparin level  Emeline Gins 01/18/2012,11:31 PM

## 2012-01-18 NOTE — ED Notes (Signed)
Pt c/o increased weakness and dizziness x several days that was worse today; pt sts some chest tightness; pt sts hx of chronic back pain and been having trouble with pain meds and stopped taking 2 weeks ago; pt hypotensive with irregular HR at present

## 2012-01-18 NOTE — H&P (Signed)
[facility] History and Physical  Steve Youngberg Bodnar  ZOX:[id number]  DOB: July 11, 1926   DOA: 01/18/2012   PCP:   Carrie Mew, MD   Chief Complaint:  Tachycardic and not feeling well since today  HPI: [name] is an 78 y.o. male.  Hypertension, BPH on Flomax for one or 2 years, has been feeling lightheaded on and off for some weeks, recently discontinued his diuretic the advice of a physician because of this lightheadedness concerned that his pressure was too low. Was feeling particularly lightheaded today, family member took his blood pressure and found him to have a systolic in the 60s, and a pulse in the 130s. His doctor was contacted and he was advised to come to the ER.  Emergency room was found to be in A. fib with rapid ventricular response, and the hospitalist service called to assist with management.  Suffers with chronic back pains radiating to his chest; diagnosed as DJD; has been them treating with naproxen, also takes high-dose aspirin about 650mg  3 times daily; feels his abdominal pain is worse since using NSAIDS. Has been using morphine and Percocet, but these caused chest tightness nausea and vomiting, and he does not wish to have any of these. Willing to try fentanyl because of the short duration of action, in case of side effects.  Denies alcohol or illicit drug use, denies thyroid disease. 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  Rewiew of Systems:   All systems negative except as marked bold or noted in the HPI;  Constitutional: Negative for malaise, fever and chills. ;  Eyes: Negative for eye pain, redness and discharge. ;  ENMT: Negative for ear pain, hoarseness, nasal congestion, sinus pressure and sore throat. ;  Cardiovascular: Negative for chest pain, palpitations, diaphoresis, dyspnea and peripheral edema. ;  Respiratory: Negative for cough, hemoptysis, wheezing and stridor. ;  Gastrointestinal: Negative  for  diarrhea, constipation, abdominal pain, melena, blood in stool, hematemesis, jaundice and rectal bleeding. unusual weight loss..  nausea, vomiting, associated with narcotic use Genitourinary: Negative for frequency, dysuria, incontinence,flank pain and hematuria; Musculoskeletal: Negative for back pain and neck pain. Negative for swelling and trauma.;  Skin: . Negative for pruritus, rash, abrasions, bruising and skin lesion.; ulcerations Neuro: Negative for headache, lightheadedness and neck stiffness. Negative for weakness, altered level of consciousness , altered mental status, extremity weakness, burning feet, involuntary movement, seizure and syncope.  Psych: negative for anxiety, depression, insomnia, tearfulness, panic attacks, hallucinations, paranoia, suicidal or homicidal ideation    Past Medical History  Diagnosis Date  . Obesity   . Hypertension   . Edema   . BPH (benign prostatic hypertrophy)   . Asthma   . Bronchospasm, exercise-induced   . Chronic kidney disease   . Arthritis     Past Surgical History  Procedure Date  . Vascular surgery removed varicose veins    Medications:  HOME MEDS: Prior to Admission medications   Medication Sig Start Date End Date Taking? Authorizing Provider  acetaminophen (TYLENOL) 500 MG tablet Take 1,000 mg by mouth 2 (two) times daily as needed. For pain   Yes Historical Provider, MD  albuterol (PROVENTIL HFA;VENTOLIN HFA) 108 (90 BASE) MCG/ACT inhaler Inhale 1 puff into the lungs every 6 (six) hours as needed. For bronchial spasms 09/03/10  Yes Stacie Glaze, MD  aspirin 325 MG EC tablet Take 650 mg by mouth 2 (two) times daily as needed. For pain   Yes Historical Provider, MD  Calcium  Carbonate-Vitamin D (CALCIUM 600+D) 600-400 MG-UNIT per tablet Take 1 tablet by mouth daily.   Yes Historical Provider, MD  naproxen sodium (ANAPROX) 220 MG tablet Take 440 mg by mouth 2 (two) times daily as needed. For pain   Yes Historical Provider, MD   rosuvastatin (CRESTOR) 20 MG tablet Take 20 mg by mouth once a week. On Fridays   Yes Historical Provider, MD  Tamsulosin HCl (FLOMAX) 0.4 MG CAPS Take 0.4 mg by mouth daily after supper.   Yes Historical Provider, MD  triamterene-hydrochlorothiazide (MAXZIDE-25) 37.5-25 MG per tablet Take 0.5 tablets by mouth daily. 08/30/11 08/29/12 Yes Stacie Glaze, MD  zolpidem (AMBIEN) 10 MG tablet Take 5 mg by mouth at bedtime as needed. May take another 1/2 tablet if needed   Yes Historical Provider, MD  Multiple Vitamins-Minerals (OCUVITE PRESERVISION) TABS Take 2 tablets by mouth daily.      Historical Provider, MD     Allergies:  Allergies  Allergen Reactions  . Vicodin (Hydrocodone-Acetaminophen) Other (See Comments)    Unknown reaction many years ago  . Morphine And Related Nausea And Vomiting    Social History:   reports that he quit smoking about 47 years ago. He does not have any smokeless tobacco history on file. He reports that he does not drink alcohol or use illicit drugs.  Family History: Family History  Problem Relation Age of Onset  . Alzheimer's disease Mother   . Heart disease Father      Physical Exam: Filed Vitals:   01/18/12 2115 01/18/12 2130 01/18/12 2145 01/18/12 2231  BP: 76/42 109/62 95/51 101/50  Pulse: 68 109 75 103  Temp:    98.9 F (37.2 C)  TempSrc:    Oral  Resp: 28 16 21 16   Height:    5\' 9"  (1.753 m)  Weight:    91.7 kg (202 lb 2.6 oz)  SpO2: 96% 96% 96% 95%   Blood pressure 101/50, pulse 103, temperature 98.9 F (37.2 C), temperature source Oral, resp. rate 16, height 5\' 9"  (1.753 m), weight 91.7 kg (202 lb 2.6 oz), SpO2 95.00%.  GEN:  Pleasant pleasant obese elderly Caucasian gentleman reclining  in the stretcher in no acute distress; cooperative with exam PSYCH:  alert and oriented x4; does appear a little anxious but; affect is appropriate. HEENT: Mucous membranes pink, dry, and anicteric; PERRLA; EOM intact; no cervical lymphadenopathy nor  thyromegaly or carotid bruit; no JVD; Breasts:: Not examined CHEST WALL: No tenderness CHEST: Normal respiration, clear to auscultation bilaterally HEART: Regular rate and rhythm; no murmurs rubs or gallops BACK: Marked lower thoracic kyphosis; no spinal tenderness; no CVA tenderness ABDOMEN: Obese, sof,t tender in both lower quadrants; no masses, no organomegaly, normal abdominal bowel sounds; no intertriginous candida. Rectal Exam: Not done EXTREMITIES:  age-appropriate arthropathy of the hands and knees; large ankles no edema; no ulcerations. Genitalia: not examined PULSES: 2+ and symmetric SKIN: Normal hydration no rash or ulceration CNS: Cranial nerves 2-12 grossly intact no focal lateralizing neurologic deficit   Labs on Admission:  Basic Metabolic Panel:  Lab 01/18/12 4540  NA 143  K 4.1  CL 103  CO2 30  GLUCOSE 92  BUN 18  CREATININE 0.72  CALCIUM 9.5  MG --  PHOS --   Liver Function Tests:  Lab 01/18/12 1837  AST 8  ALT 8  ALKPHOS 54  BILITOT 0.1*  PROT 6.6  ALBUMIN 3.6   No results found for this basename: LIPASE:5,AMYLASE:5 in the last 168 hours  No results found for this basename: AMMONIA:5 in the last 168 hours CBC:  Lab 01/18/12 1837  WBC 9.5  NEUTROABS 6.3  HGB 10.7*  HCT 33.0*  MCV 95.7  PLT 298   Cardiac Enzymes: No results found for this basename: CKTOTAL:5,CKMB:5,CKMBINDEX:5,TROPONINI:5 in the last 168 hours BNP: No components found with this basename: POCBNP:5 CBG: No results found for this basename: GLUCAP:5 in the last 168 hours  Results for orders placed during the hospital encounter of 01/18/12 (from the past 48 hour(s))  CBC WITH DIFFERENTIAL     Status: Abnormal   Collection Time   01/18/12  6:37 PM      Component Value Range Comment   WBC 9.5  4.0 - 10.5 K/uL    RBC 3.45 (*) 4.22 - 5.81 MIL/uL    Hemoglobin 10.7 (*) 13.0 - 17.0 g/dL    HCT 16.1 (*) 09.6 - 52.0 %    MCV 95.7  78.0 - 100.0 fL    MCH 31.0  26.0 - 34.0 pg     MCHC 32.4  30.0 - 36.0 g/dL    RDW 04.5  40.9 - 81.1 %    Platelets 298  150 - 400 K/uL    Neutrophils Relative 66  43 - 77 %    Neutro Abs 6.3  1.7 - 7.7 K/uL    Lymphocytes Relative 22  12 - 46 %    Lymphs Abs 2.1  0.7 - 4.0 K/uL    Monocytes Relative 11  3 - 12 %    Monocytes Absolute 1.1 (*) 0.1 - 1.0 K/uL    Eosinophils Relative 1  0 - 5 %    Eosinophils Absolute 0.1  0.0 - 0.7 K/uL    Basophils Relative 0  0 - 1 %    Basophils Absolute 0.0  0.0 - 0.1 K/uL   COMPREHENSIVE METABOLIC PANEL     Status: Abnormal   Collection Time   01/18/12  6:37 PM      Component Value Range Comment   Sodium 143  135 - 145 mEq/L    Potassium 4.1  3.5 - 5.1 mEq/L    Chloride 103  96 - 112 mEq/L    CO2 30  19 - 32 mEq/L    Glucose, Bld 92  70 - 99 mg/dL    BUN 18  6 - 23 mg/dL    Creatinine, Ser 9.14  0.50 - 1.35 mg/dL    Calcium 9.5  8.4 - 78.2 mg/dL    Total Protein 6.6  6.0 - 8.3 g/dL    Albumin 3.6  3.5 - 5.2 g/dL    AST 8  0 - 37 U/L    ALT 8  0 - 53 U/L    Alkaline Phosphatase 54  39 - 117 U/L    Total Bilirubin 0.1 (*) 0.3 - 1.2 mg/dL    GFR calc non Af Amer 85 (*) >90 mL/min    GFR calc Af Amer >90  >90 mL/min   POCT I-STAT TROPONIN I     Status: Normal   Collection Time   01/18/12  6:42 PM      Component Value Range Comment   Troponin i, poc 0.02  0.00 - 0.08 ng/mL    Comment 3            PRO B NATRIURETIC PEPTIDE     Status: Abnormal   Collection Time   01/18/12  7:01 PM      Component Value Range  Comment   Pro B Natriuretic peptide (BNP) 1909.0 (*) 0 - 450 pg/mL      Radiological Exams on Admission: Dg Chest Portable 1 View  01/18/2012  *RADIOLOGY REPORT*  Clinical Data: Hypotension.  Nonsmoker.  PORTABLE CHEST - 1 VIEW  Comparison: None.  Findings: Cardiomegaly.  Tortuous calcified aorta.  No infiltrate, congestive heart failure or pneumothorax.  IMPRESSION: Cardiomegaly.  Tortuous calcified aorta.  No infiltrate, congestive heart failure or pneumothorax.   Original Report  Authenticated By: Fuller Canada, M.D.     EKG: Independently reviewed. It showed fibrillation with rapid ventricular response   Assessment/Plan Present on Admission:  .Atrial fibrillation with RVR .Chest tightness .Dizziness .Hypotension .BENIGN PROSTATIC HYPERTROPHY .ASTHMA .HYPERTENSION .HYPERLIPIDEMIA .DJD (degenerative joint disease), lumbar .Abdominal pain   PLAN: Since chest symptoms and hypotension likely related to atrial fibrillation, we'll focus on rate control with Cardizem drip. Will check a d-dimer in case his symptoms of been precipitated by her primary embolus but if negative will refrain from doing a CT angiogram. Unclear how long he's been in atrial fibrillation since he has no fluttering sensation in his ches; Will start heparin drip, and consult cardiologist in the morning for further assistance with management To check thyroid, does know some months ago it was normal  Trial of gabapentin for his back and abdominal pain since it may be radicular, and she is very particular about the type of pain medications he will use. Will restart tamsulosin, since he says he's been taking this for years without any symptoms of dizziness  Other plans as per orders.  Code Status: DNR  Family Communication: Lillia Abed,  [phone]) [phone])  Rayson Rando s, [phone])  Disposition Plan:  Lillan Mccreadie Nocturnist [facility] Pager [phone]   01/18/2012, 10:51 PM

## 2012-01-19 ENCOUNTER — Inpatient Hospital Stay (HOSPITAL_COMMUNITY): Payer: Medicare Other

## 2012-01-19 ENCOUNTER — Encounter (HOSPITAL_COMMUNITY): Payer: Self-pay | Admitting: *Deleted

## 2012-01-19 DIAGNOSIS — I359 Nonrheumatic aortic valve disorder, unspecified: Secondary | ICD-10-CM

## 2012-01-19 DIAGNOSIS — R109 Unspecified abdominal pain: Secondary | ICD-10-CM

## 2012-01-19 DIAGNOSIS — N10 Acute tubulo-interstitial nephritis: Secondary | ICD-10-CM

## 2012-01-19 LAB — CBC
MCH: 31.1 pg (ref 26.0–34.0)
MCV: 94.9 fL (ref 78.0–100.0)
Platelets: 297 10*3/uL (ref 150–400)
RBC: 3.31 MIL/uL — ABNORMAL LOW (ref 4.22–5.81)
RDW: 14.5 % (ref 11.5–15.5)
WBC: 10.5 10*3/uL (ref 4.0–10.5)

## 2012-01-19 LAB — URINALYSIS, ROUTINE W REFLEX MICROSCOPIC
Glucose, UA: NEGATIVE mg/dL
Hgb urine dipstick: NEGATIVE
pH: 7.5 (ref 5.0–8.0)

## 2012-01-19 LAB — PROTIME-INR
INR: 0.95 (ref 0.00–1.49)
Prothrombin Time: 12.9 seconds (ref 11.6–15.2)

## 2012-01-19 LAB — URINE MICROSCOPIC-ADD ON

## 2012-01-19 LAB — COMPREHENSIVE METABOLIC PANEL
ALT: 7 U/L (ref 0–53)
AST: 9 U/L (ref 0–37)
Albumin: 3.3 g/dL — ABNORMAL LOW (ref 3.5–5.2)
CO2: 24 mEq/L (ref 19–32)
Calcium: 9 mg/dL (ref 8.4–10.5)
Chloride: 102 mEq/L (ref 96–112)
Creatinine, Ser: 0.54 mg/dL (ref 0.50–1.35)
GFR calc non Af Amer: >90 mL/min (ref >90)
Sodium: 135 mEq/L (ref 135–145)

## 2012-01-19 LAB — APTT: aPTT: 32 seconds (ref 24–37)

## 2012-01-19 LAB — HEPARIN LEVEL (UNFRACTIONATED): Heparin Unfractionated: 0.33 IU/mL (ref 0.30–0.70)

## 2012-01-19 MED ORDER — FENTANYL CITRATE 0.05 MG/ML IJ SOLN
25.0000 ug | INTRAMUSCULAR | Status: DC | PRN
  Administered 2012-01-19: 50 ug via INTRAVENOUS
  Filled 2012-01-19: qty 2

## 2012-01-19 MED ORDER — SODIUM CHLORIDE 0.9 % IV SOLN
INTRAVENOUS | Status: AC
  Administered 2012-01-19: 17:00:00 via INTRAVENOUS
  Administered 2012-01-19 (×2): 125 mL/h via INTRAVENOUS
  Administered 2012-01-20: 15:00:00 via INTRAVENOUS
  Administered 2012-01-20: 125 mL/h via INTRAVENOUS
  Administered 2012-01-20: 75 mL/h via INTRAVENOUS
  Administered 2012-01-21: 14:00:00 via INTRAVENOUS

## 2012-01-19 MED ORDER — ONDANSETRON HCL 4 MG/2ML IJ SOLN: 4.0000 mg | Freq: Four times a day (QID) | INTRAMUSCULAR | Status: DC | PRN

## 2012-01-19 MED ORDER — IOHEXOL 350 MG/ML SOLN
100.0000 mL | Freq: Once | INTRAVENOUS | Status: AC | PRN
  Administered 2012-01-19: 100 mL via INTRAVENOUS

## 2012-01-19 MED ORDER — SODIUM CHLORIDE 0.9 % IV BOLUS (SEPSIS)
500.0000 mL | Freq: Once | INTRAVENOUS | Status: AC
  Administered 2012-01-19: 500 mL via INTRAVENOUS

## 2012-01-19 MED ORDER — TAMSULOSIN HCL 0.4 MG PO CAPS
0.4000 mg | ORAL_CAPSULE | Freq: Every day | ORAL | Status: DC
  Administered 2012-01-19 – 2012-01-23 (×5): 0.4 mg via ORAL
  Filled 2012-01-19 (×7): qty 1

## 2012-01-19 MED ORDER — SODIUM CHLORIDE 0.9 % IV BOLUS (SEPSIS)
250.0000 mL | Freq: Once | INTRAVENOUS | Status: AC
  Administered 2012-01-19: 250 mL via INTRAVENOUS

## 2012-01-19 MED ORDER — LEVALBUTEROL HCL 0.63 MG/3ML IN NEBU
0.6300 mg | INHALATION_SOLUTION | RESPIRATORY_TRACT | Status: DC | PRN
  Filled 2012-01-19 (×2): qty 3

## 2012-01-19 MED ORDER — HEPARIN BOLUS VIA INFUSION
3000.0000 [IU] | Freq: Once | INTRAVENOUS | Status: AC
  Administered 2012-01-19: 3000 [IU] via INTRAVENOUS
  Filled 2012-01-19: qty 3000

## 2012-01-19 MED ORDER — PANTOPRAZOLE SODIUM 40 MG PO TBEC
40.0000 mg | DELAYED_RELEASE_TABLET | Freq: Every day | ORAL | Status: DC
  Administered 2012-01-20 – 2012-01-24 (×5): 40 mg via ORAL
  Filled 2012-01-19 (×5): qty 1

## 2012-01-19 MED ORDER — ONDANSETRON HCL 4 MG/2ML IJ SOLN: 4.0000 mg | INTRAMUSCULAR | Status: DC | PRN

## 2012-01-19 MED ORDER — SODIUM CHLORIDE 0.9 % IV BOLUS (SEPSIS)
1000.0000 mL | Freq: Once | INTRAVENOUS | Status: AC
  Administered 2012-01-19: 1000 mL via INTRAVENOUS

## 2012-01-19 MED ORDER — DEXTROSE 5 % IV SOLN
1.0000 g | INTRAVENOUS | Status: DC
  Administered 2012-01-19 – 2012-01-22 (×4): 1 g via INTRAVENOUS
  Filled 2012-01-19 (×4): qty 10

## 2012-01-19 MED ORDER — FENTANYL 25 MCG/HR TD PT72
25.0000 ug | MEDICATED_PATCH | TRANSDERMAL | Status: DC
  Administered 2012-01-19: 25 ug via TRANSDERMAL
  Filled 2012-01-19: qty 1

## 2012-01-19 NOTE — Progress Notes (Signed)
ANTICOAGULATION CONSULT NOTE - Follow Up Consult  Pharmacy Consult for heparin Indication: atrial fibrillation  Allergies  Allergen Reactions  . Vicodin (Hydrocodone-Acetaminophen) Other (See Comments)    Unknown reaction many years ago  . Morphine And Related Nausea And Vomiting    Patient Measurements: Height: 5\' 9"  (175.3 cm) Weight: 200 lb 2.8 oz (90.8 kg) IBW/kg (Calculated) : 70.[name]  Heparin Dosing Weight: 90kg  Vital Signs: Temp: 98.6 F (37 C) (09/04 0742) Temp src: Oral (09/04 0742) BP: 87/45 mmHg (09/04 0911) Pulse Rate: 116  (09/04 0911)  Labs:  Alvira Philips 01/19/12 1610 01/19/12 0612 01/19/12 0014 01/18/12 1837  HGB -- 10.3* -- 10.[name]*  HCT -- 31.4* -- 33.0*  PLT -- 297 -- 298  APTT -- -- 32 --  LABPROT -- -- 12.9 --  INR -- -- 0.95 --  HEPARINUNFRC <0.10* -- -- --  CREATININE -- 0.54 -- 0.72  CKTOTAL -- -- -- --  CKMB -- -- -- --  TROPONINI -- -- -- --    Estimated Creatinine Clearance: 79.2 ml/min (by C-G formula based on Cr of 0.54).   Medications:  Scheduled:    . acetaminophen  650 mg Oral Once  . aspirin EC  81 mg Oral Daily  . aspirin  325 mg Oral Once  . atorvastatin  40 mg Oral q1800  . diltiazem  20 mg Intravenous Once  . fentaNYL      . fentaNYL  25 mcg Intravenous Once  . gabapentin  100 mg Oral TID  . gi cocktail  30 mL Oral Once  . heparin  4,500 Units Intravenous Once  . pantoprazole  40 mg Oral BID AC  . sodium chloride  1,000 mL Intravenous STAT  . sodium chloride  3 mL Intravenous Q12H  . DISCONTD: sodium chloride   Intravenous STAT  . DISCONTD: acetaminophen  1,000 mg Oral Once    Assessment: 78 yo [name] here with afib on heparin. The initial level is undetectable.  Per RN heparin was off for 4-6 hours and was restarted at [name]:30am.  Goal of Therapy:  Heparin level 0.3-0.[name] units/ml Monitor platelets by anticoagulation protocol: Yes   Plan:  -Will give 3000 units bolus since infusion has been off -Will maintain heparin at  1450 units/hr -Heparin level in 8 hrs  [name], Pharm D 01/19/2012 9:29 AM

## 2012-01-19 NOTE — Care Management Note (Addendum)
    Page 1 of 1   01/21/2012     11:41:29 AM   CARE MANAGEMENT NOTE 01/21/2012  Patient:  QUENTON, RECENDEZ   Account Number:  [id number]  Date Initiated:  01/19/2012  Documentation initiated by:  Junius Creamer  Subjective/Objective Assessment:   adm w ch pain, iv cardizem, hypotension     Action/Plan:   lives w family, pcp Darryll Capers   Anticipated DC Date:     Anticipated DC Plan:        DC Planning Services  CM consult      Choice offered to / List presented to:             Status of service:   Medicare Important Message given?   (If response is "NO", the following Medicare IM given date fields will be blank) Date Medicare IM given:   Date Additional Medicare IM given:    Discharge Disposition:    Per UR Regulation:  Reviewed for med. necessity/level of care/duration of stay  If discussed at Long Length of Stay Meetings, dates discussed:    Comments:  9/6 11:40a debbie Valencia Kassa rn,bsn pt has 30.00 per month copay for xarelto w no prior auth needed. will give pt 10day free xarelto card.  9/4 8:50a debbie Takisha Pelle rn,bsn [phone]

## 2012-01-19 NOTE — ED Provider Notes (Signed)
I saw and evaluated the patient, reviewed the resident's note and I agree with the findings and plan. I personally evaluated the ECG and agree with the interpretation of the resident  Will admit for new onset afib with RVR. Attempting to rate control with IV cardizem  1. Hypotension   2. Dizziness   3. Chest tightness   4. Atrial fibrillation with RVR    Results for orders placed during the hospital encounter of 01/18/12  PRO B NATRIURETIC PEPTIDE      Component Value Range   Pro B Natriuretic peptide (BNP) 1909.0 (*) 0 - 450 pg/mL  CBC WITH DIFFERENTIAL      Component Value Range   WBC 9.5  4.0 - 10.5 K/uL   RBC 3.45 (*) 4.22 - 5.81 MIL/uL   Hemoglobin 10.7 (*) 13.0 - 17.0 g/dL   HCT 19.1 (*) 47.8 - 29.5 %   MCV 95.7  78.0 - 100.0 fL   MCH 31.0  26.0 - 34.0 pg   MCHC 32.4  30.0 - 36.0 g/dL   RDW 62.1  30.8 - 65.7 %   Platelets 298  150 - 400 K/uL   Neutrophils Relative 66  43 - 77 %   Neutro Abs 6.3  1.7 - 7.7 K/uL   Lymphocytes Relative 22  12 - 46 %   Lymphs Abs 2.1  0.7 - 4.0 K/uL   Monocytes Relative 11  3 - 12 %   Monocytes Absolute 1.1 (*) 0.1 - 1.0 K/uL   Eosinophils Relative 1  0 - 5 %   Eosinophils Absolute 0.1  0.0 - 0.7 K/uL   Basophils Relative 0  0 - 1 %   Basophils Absolute 0.0  0.0 - 0.1 K/uL  COMPREHENSIVE METABOLIC PANEL      Component Value Range   Sodium 143  135 - 145 mEq/L   Potassium 4.1  3.5 - 5.1 mEq/L   Chloride 103  96 - 112 mEq/L   CO2 30  19 - 32 mEq/L   Glucose, Bld 92  70 - 99 mg/dL   BUN 18  6 - 23 mg/dL   Creatinine, Ser 8.46  0.50 - 1.35 mg/dL   Calcium 9.5  8.4 - 96.2 mg/dL   Total Protein 6.6  6.0 - 8.3 g/dL   Albumin 3.6  3.5 - 5.2 g/dL   AST 8  0 - 37 U/L   ALT 8  0 - 53 U/L   Alkaline Phosphatase 54  39 - 117 U/L   Total Bilirubin 0.1 (*) 0.3 - 1.2 mg/dL   GFR calc non Af Amer 85 (*) >90 mL/min   GFR calc Af Amer >90  >90 mL/min  POCT I-STAT TROPONIN I      Component Value Range   Troponin i, poc 0.02  0.00 - 0.08 ng/mL   Comment 3           MRSA PCR SCREENING      Component Value Range   MRSA by PCR NEGATIVE  NEGATIVE   Dg Thoracic Spine W/swimmers  12/30/2011  *RADIOLOGY REPORT*  Clinical Data: Back pain  THORACIC SPINE - 2 VIEW + SWIMMERS  Comparison: None.  Findings: Mild degenerate changes are noted.  Vertebral body height is well maintained.  Pedicles are within normal limits.  No paraspinal mass lesion is seen.  Hyperinflation of the lungs consistent with COPD is noted.  IMPRESSION: Degenerative change without acute abnormality.  Original Report Authenticated By: Phillips Odor, M.D.  Dg Lumbar Spine Complete  12/30/2011  *RADIOLOGY REPORT*  Clinical Data: Low back pain for several months.  LUMBAR SPINE - COMPLETE 4+ VIEW  Comparison: Thoracic spine radiographs 12/1956 2013  Findings: There is heavy atherosclerotic calcification of the abdominal aorta.  There is transitional anatomy of the lumbosacral junction, a normal variant.  There is mild to moderate multilevel degenerative change.  Disc space narrowing is most prominent at L5/transitional vertebra at the lumbosacral junction.  There is moderate multilevel facet generative changes at all levels of the lumbar spine.  No fracture or suspicious bony abnormality is identified.  No evidence of pars defect.  IMPRESSION:  1.  Multilevel degenerative disc disease and moderate facet joint degenerative changes throughout the lumbar spine. 2.  No acute bony abnormality identified. 3.  Heavy atherosclerotic calcification of the abdominal aorta.  Original Report Authenticated By: Britta Mccreedy, M.D.   Mr Lumbar Spine Wo Contrast  01/07/2012  *RADIOLOGY REPORT*  Clinical Data: Low back pain for 2 months.  MRI LUMBAR SPINE WITHOUT CONTRAST  Technique:  Multiplanar and multiecho pulse sequences of the lumbar spine were obtained without intravenous contrast.  Comparison: The/15/13  Findings: A transitional lumbosacral vertebra is assumed to represent the S1 level.  If  procedural intervention is to be performed, careful correlation with this numbering strategy is recommended.  The conus medullaris appears unremarkable.  Conus level:  L1-2.  Pedicles appear mildly congenitally short in the lumbar spine.  Multilevel Schmorl's nodes noted.  Loss of intervertebral disc height noted at L4-5 with type 2 degenerative endplate findings.  There are disc bulges at the T11-12, T12-L1, and L1-2 levels. Additional findings at individual levels are as follows:  L2-3:  Prominent central stenosis noted with moderate right and mild left subarticular lateral recess stenosis as well as mild left and borderline right foraminal stenosis secondary to disc bulge, intervertebral spurring, facet arthropathy, ligament flavum redundancy, and congenitally short pedicles.  L3-4:  Moderate to prominent central stenosis, mild bilateral subarticular lateral recess stenosis, and mild bilateral foraminal stenosis secondary to disc bulge, intervertebral and facet spurring, and congenitally short pedicles.  L4-5:  Prominent central stenosis noted with mild bilateral foraminal and bilateral subarticular lateral recess stenosis secondary to disc osteophyte complex, shallow left lateral recess and foraminal disc protrusion, facet arthropathy, and congenitally short pedicles.  L5-S1:  Mild to moderate bilateral foraminal stenosis, mild right subarticular lateral recess stenosis, and mild central stenosis secondary to central disc protrusion, intervertebral spurring, facet arthropathy, and short pedicles.  S1-2: No impingement.  S1 has mildly transitional characteristics although there is a full sized intervertebral disc at S1-2. Conjoined appearing left S1 and S2 nerve roots noted.  IMPRESSION:  1.  Spondylosis, degenerative disc disease, and congenitally short pedicles cause prominent impingement at L2-3 and L4-5, moderate to prominent impingement at L3-4, and mild to moderate impingement at L5-S1 as described above.  2.  Transitional S1 vertebra.  Careful correlation with this numbering strategy is recommended.   Original Report Authenticated By: Dellia Cloud, M.D.    US Abdomen Complete  12/31/2011  *RADIOLOGY REPORT*  Clinical Data:  Abdominal pain  COMPLETE ABDOMINAL ULTRASOUND  Comparison:  None  Findings:  Gallbladder:  3 mm echogenic focus in the gallbladder which did not move, compatible with a small polyp.  Negative for gallbladder wall thickening.  Negative sonographic Murphy's sign.  Common bile duct:  6.2 mm  Liver:  8 mm cyst in the central liver.  Liver is prominent size measuring 18.5  cm in length.  IVC:  No  Pancreas:  No focal abnormality seen.  Spleen:  8.3 cm.  Right Kidney:  12.7 cm.  Fullness of the right renal pelvis without caliectasis suggesting extrarenal pelvis.  No renal mass.  Left Kidney:  12.4 cm in length.  Mild hydronephrosis.  Abdominal aorta:    Atherosclerotic aorta without aneurysm.  Right common iliac artery is prominent measuring 1.8 cm.  Left common iliac artery measures 1.6 cm.  IMPRESSION: Small gallbladder polyp.  Mild hepatomegaly.  Extrarenal pelvis right kidney.  Mild hydronephrosis left kidney.  Original Report Authenticated By: Camelia Phenes, M.D.   Dg Epidurography  01/07/2012  *RADIOLOGY REPORT*  CLINICAL DATA:  Lumbosacral spondylosis without myelopathy. Displacement of the lumbar disc at multiple levels.  The patient has localized non mechanical pain at the thoracolumbar junction which extends to the right greater than left flank.  LUMBAR EPIDURAL INJECTION: An interlaminar approach was performed on the right at L2-3.  The overlying skin was cleansed and anesthetized.  A 20 gauge spinal needle was advanced using loss-of-resistance technique.  Injection of 2cc of Omnipaque 180 confirmed epidural placement.  There was no evidence for intravascular or intrathecal spread of contrast.  I then injected 120 mg of Depo-Medrol and 3ml of 1% lidocaine.  The patient tolerated  the procedure without evidence for complication. The patient  was observed for 20 minutes prior to discharge in stable neurologic condition.  FLUORO TIME:  48 seconds  IMPRESSIONS:  Technically successful first interlaminar epidural steroid injection on the right at L2-3.  The patient has multilevel facet degenerative changes as well which may certainly be contributing to his pain.  If he does not have significant relief of pain with this epidural steroid injection, facet injections may be useful as the next step in treating this patient and identifying the etiology of his pain.   Original Report Authenticated By: Jamesetta Orleans. MATTERN, M.D.    Dg Chest Portable 1 View  01/18/2012  *RADIOLOGY REPORT*  Clinical Data: Hypotension.  Nonsmoker.  PORTABLE CHEST - 1 VIEW  Comparison: None.  Findings: Cardiomegaly.  Tortuous calcified aorta.  No infiltrate, congestive heart failure or pneumothorax.  IMPRESSION: Cardiomegaly.  Tortuous calcified aorta.  No infiltrate, congestive heart failure or pneumothorax.   Original Report Authenticated By: Fuller Canada, M.D.    I personally reviewed the imaging tests through PACS system  I reviewed available ER/hospitalization records thought the EMR   Lyanne Co, MD 01/19/12 [phone]

## 2012-01-19 NOTE — Progress Notes (Signed)
Called Dr. Sharon Seller to report low bp of 74/35.  New orders received for 500 cc fluid bolus.

## 2012-01-19 NOTE — Progress Notes (Signed)
ANTICOAGULATION CONSULT NOTE - Follow Up Consult  Pharmacy Consult for heparin Indication: atrial fibrillation  Allergies  Allergen Reactions  . Vicodin (Hydrocodone-Acetaminophen) Other (See Comments)    Unknown reaction many years ago  . Morphine And Related Nausea And Vomiting    Patient Measurements: Height: 5\' 9"  (175.3 cm) Weight: 200 lb 2.8 oz (90.8 kg) IBW/kg (Calculated) : 70.7  Heparin Dosing Weight: 90kg  Vital Signs: Temp: 98.6 F (37 C) (09/04 1600) Temp src: Oral (09/04 1600) BP: 87/45 mmHg (09/04 1900) Pulse Rate: 94  (09/04 1100)  Labs:  Basename 01/19/12 1819 01/19/12 0808 01/19/12 0612 01/19/12 0014 01/18/12 1837  HGB -- -- 10.3* -- 10.7*  HCT -- -- 31.4* -- 33.0*  PLT -- -- 297 -- 298  APTT -- -- -- 32 --  LABPROT -- -- -- 12.9 --  INR -- -- -- 0.95 --  HEPARINUNFRC 0.33 <0.10* -- -- --  CREATININE -- -- 0.54 -- 0.72  CKTOTAL -- -- -- -- --  CKMB -- -- -- -- --  TROPONINI -- -- -- -- --    Estimated Creatinine Clearance: 79.2 ml/min (by C-G formula based on Cr of 0.54).   Assessment: 78 yo male here with afib on heparin. The initial level is undetectable.  Per RN heparin was off for 4-6 hours and was restarted at 7:30am.  Now heparin level = 0.33 after 3000 unit bolus and 1450 units/hr = therapeutic.  No bleeding reported.  Goal of Therapy:  Heparin level 0.3-0.7 units/ml Monitor platelets by anticoagulation protocol: Yes   Plan:  --continue heparin at 1450 units/hr - daily HL and CBC while on heparin Herby Abraham, Pharm.D. [phone] 01/19/2012 7:32 PM -

## 2012-01-19 NOTE — Progress Notes (Signed)
Telephoned Dr. Sharon Seller to report continued pain  Complaints from patient after administration of Fentanyl 50 mcg, Tylenol 1000 mg and Neurontin 100 mg.  Also, BP continues to be low with a sbp in the 90's.  New orders received.

## 2012-01-19 NOTE — Progress Notes (Signed)
PT Cancellation Note  Treatment cancelled today due to medical issues with patient which prohibited therapy.  Patient is hypotensive and vomiting per nursing.  Will attempt at later date.  Thanks.  INGOLD,Seyed Heffley 01/19/2012, 10:24 AM  [facility] [phone] (pager)

## 2012-01-19 NOTE — Progress Notes (Signed)
Pt is vomiting and complaining of shortness of breath. Dr. Sharon Seller was called and new orders were received.

## 2012-01-19 NOTE — Progress Notes (Signed)
PT Note  Noted MD discontinued PT order.  Please order if/when needed.  Thanks!  Najeh Credit M 01/19/2012, 11:59 AM   01/19/2012 Cephus Shelling, PT, DPT [phone]

## 2012-01-19 NOTE — Progress Notes (Signed)
[facility] Progress Note [place] TEAM 1 - Stepdown/ICU TEAM   Heru Montz Vandrunen WUJ:[id number] DOB: [birth year] DOA: 01/18/2012 PCP: Carrie Mew, MD  Brief narrative: 78 y.o. Male w/ hx of Hypertension, BPH on Flomax , has been feeling lightheaded on and off for some weeks, recently discontinued his diuretic on the advice of a physician because of this lightheadedness concerned that his pressure was too low. Was feeling particularly lightheaded - family member took his blood pressure and found him to have a systolic in the 60s, and a pulse in the 130s. His doctor was contacted and he was advised to come to the ER.  In the Emergency room the pt was found to be in A. fib with rapid ventricular response.  Additionally, the pt suffers with chronic back pains radiating to his chest; diagnosed as DJD; has been treating with naproxen and high-dose aspirin about 650mg  3 times daily; feels his abdominal pain is worse since using NSAIDS.   Assessment/Plan:  Afib with rapid ventricular response Possibly brought about by bacteremia and resulting hypotension - hydrate and follow - avoid rate controlling meds at present time due to hypotension - if rate control becomes an issue we'll consider IV digoxin load - at present rate appears to be responding well to volume resuscitation  Hypotension Felt to be evidence of significant volume depletion plus early bacteremia - continue aggressive volume resuscitation in this patient with a confirmed normal ejection fraction  Pyelonephritis versus prostatitis UA is markedly abnormal - initiate empiric Rocephin therapy - followup culture  BPH Continue medical therapy as long as blood pressure will allow  Normocytic anemia Check iron indices and follow trend  History of hypertension Not an active issue at present  Hyperlipidemia Continue medical therapy  Lumbar degenerative joint disease ?if back pain has actually been CVA tenderness instead  - follow with treatment of pyelonephritis  Code Status: No CODE BLUE Disposition Plan: Remain in step down unit  Consultants: None  Procedures: None  Antibiotics: Rocephin 01/19/2012  DVT prophylaxis: Full dose heparin  HPI/Subjective: The patient is awake and alert.  He reports he feels better than when he first came into the hospital.  He denies chest pain shortness of breath fevers or chills.  He does complain of generalized nausea but he is not vomiting.   Objective: Blood pressure 88/35, pulse 96, temperature 98.6 F (37 C), temperature source Oral, resp. rate 27, height 5\' 9"  (1.753 m), weight 90.8 kg (200 lb 2.8 oz), SpO2 93.00%.  Intake/Output Summary (Last 24 hours) at 01/19/12 1051 Last data filed at 01/19/12 1005  Gross per 24 hour  Intake 1955.46 ml  Output   1775 ml  Net 180.46 ml     Exam: General: No acute respiratory distress Lungs: Clear to auscultation bilaterally without wheezes or crackles Cardiovascular: Irregularly irregular but with rate controlled at approximately 85 beats per minute without appreciable murmur gallop or rub Abdomen: Nontender, nondistended, soft, bowel sounds positive, no rebound, no ascites, no appreciable mass Extremities: No significant cyanosis, clubbing, or edema bilateral lower extremities  Data Reviewed: Basic Metabolic Panel:  Lab 01/19/12 9562 01/18/12 1837  NA 135 143  K 4.1 4.1  CL 102 103  CO2 24 30  GLUCOSE 119* 92  BUN 12 18  CREATININE 0.54 0.72  CALCIUM 9.0 9.5  MG -- --  PHOS -- --   Liver Function Tests:  Lab 01/19/12 0612 01/18/12 1837  AST 9 8  ALT 7 8  ALKPHOS 55 54  BILITOT 0.2* 0.1*  PROT 6.2 6.6  ALBUMIN 3.3* 3.6   CBC:  Lab 01/19/12 0612 01/18/12 1837  WBC 10.5 9.5  NEUTROABS -- 6.3  HGB 10.3* 10.7*  HCT 31.4* 33.0*  MCV 94.9 95.7  PLT 297 298   Recent Results (from the past 240 hour(s))  MRSA PCR SCREENING     Status: Normal   Collection Time   01/18/12 10:28 PM       Component Value Range Status Comment   MRSA by PCR NEGATIVE  NEGATIVE Final      Studies:  Recent x-ray studies have been reviewed in detail by the Attending Physician  Scheduled Meds:  Reviewed in detail by the Attending Physician   Lonia Blood, MD [facility] Office  [phone] Pager [phone]  On-Call/Text Page:      [email or web address]      password [email or web address]  If 7PM-7AM, please contact night-coverage [email or web address] Password [email or web address] 01/19/2012, 10:51 AM   LOS: 1 day

## 2012-01-19 NOTE — Progress Notes (Signed)
  Echocardiogram 2D Echocardiogram has been performed.  Cathie Beams 01/19/2012, 10:55 AM

## 2012-01-19 NOTE — Progress Notes (Signed)
On call MD notified re pt's request to remove fentanyl patch as it was causing him to have chest tightness.  MD also notified of pt's HR 120 -140's with pt at rest. MD on floor, new order received and implemented. Will monitor.

## 2012-01-20 DIAGNOSIS — J310 Chronic rhinitis: Secondary | ICD-10-CM

## 2012-01-20 LAB — BASIC METABOLIC PANEL
BUN: 12 mg/dL (ref 6–23)
CO2: 23 mEq/L (ref 19–32)
Calcium: 8 mg/dL — ABNORMAL LOW (ref 8.4–10.5)
Glucose, Bld: 123 mg/dL — ABNORMAL HIGH (ref 70–99)
Potassium: 4.2 mEq/L (ref 3.5–5.1)
Sodium: 137 mEq/L (ref 135–145)

## 2012-01-20 LAB — CBC
HCT: 27.1 % — ABNORMAL LOW (ref 39.0–52.0)
Hemoglobin: 8.9 g/dL — ABNORMAL LOW (ref 13.0–17.0)
MCH: 31.4 pg (ref 26.0–34.0)
MCHC: 32.8 g/dL (ref 30.0–36.0)
RBC: 2.83 MIL/uL — ABNORMAL LOW (ref 4.22–5.81)

## 2012-01-20 MED ORDER — GI COCKTAIL ~~LOC~~
30.0000 mL | Freq: Three times a day (TID) | ORAL | Status: DC | PRN
  Administered 2012-01-20: 30 mL via ORAL
  Filled 2012-01-20 (×2): qty 30

## 2012-01-20 MED ORDER — DILTIAZEM HCL 100 MG IV SOLR
5.0000 mg/h | INTRAVENOUS | Status: DC
  Administered 2012-01-20: 15 mg/h via INTRAVENOUS
  Administered 2012-01-20: 10 mg/h via INTRAVENOUS

## 2012-01-20 MED ORDER — FAMOTIDINE 20 MG PO TABS
20.0000 mg | ORAL_TABLET | Freq: Two times a day (BID) | ORAL | Status: DC
  Administered 2012-01-20 – 2012-01-24 (×9): 20 mg via ORAL
  Filled 2012-01-20 (×11): qty 1

## 2012-01-20 MED ORDER — RIVAROXABAN 20 MG PO TABS
20.0000 mg | ORAL_TABLET | Freq: Every day | ORAL | Status: DC
  Administered 2012-01-20 – 2012-01-24 (×5): 20 mg via ORAL
  Filled 2012-01-20 (×5): qty 1

## 2012-01-20 MED ORDER — DILTIAZEM HCL 60 MG PO TABS
60.0000 mg | ORAL_TABLET | Freq: Four times a day (QID) | ORAL | Status: DC
  Administered 2012-01-20 – 2012-01-23 (×12): 60 mg via ORAL
  Filled 2012-01-20 (×17): qty 1

## 2012-01-20 MED ORDER — HEPARIN BOLUS VIA INFUSION
3000.0000 [IU] | Freq: Once | INTRAVENOUS | Status: AC
  Administered 2012-01-20: 3000 [IU] via INTRAVENOUS
  Filled 2012-01-20: qty 3000

## 2012-01-20 MED ORDER — TRAMADOL HCL 50 MG PO TABS
50.0000 mg | ORAL_TABLET | Freq: Four times a day (QID) | ORAL | Status: DC | PRN
  Administered 2012-01-20: 100 mg via ORAL
  Filled 2012-01-20 (×2): qty 2

## 2012-01-20 MED ORDER — POLYETHYLENE GLYCOL 3350 17 G PO PACK
17.0000 g | PACK | Freq: Every day | ORAL | Status: DC | PRN
  Filled 2012-01-20: qty 1

## 2012-01-20 NOTE — Progress Notes (Signed)
ANTICOAGULATION CONSULT NOTE - Follow Up Consult  Pharmacy Consult for heparin Indication: atrial fibrillation  Labs:  Basename 01/20/12 0540 01/19/12 1819 01/19/12 0808 01/19/12 0612 01/19/12 0014 01/18/12 1837  HGB 8.9* -- -- 10.3* -- --  HCT 27.1* -- -- 31.4* -- 33.0*  PLT 242 -- -- 297 -- 298  APTT -- -- -- -- 32 --  LABPROT -- -- -- -- 12.9 --  INR -- -- -- -- 0.95 --  HEPARINUNFRC <0.10* 0.33 <0.10* -- -- --  CREATININE 0.65 -- -- 0.54 -- 0.72  CKTOTAL -- -- -- -- -- --  CKMB -- -- -- -- -- --  TROPONINI -- -- -- -- -- --    Assessment: 78yo male now undetectable on heparin after one level at goal.  Goal of Therapy:  Heparin level 0.3-0.7 units/ml   Plan:  Will rebolus with heparin 3000 units IV x1 and increase gtt by 4 units/kg/hr to 1800 units/hr and check level in 6hr.  Colleen Can PharmD BCPS 01/20/2012,7:35 AM

## 2012-01-20 NOTE — Progress Notes (Addendum)
[facility] PROGRESS NOTE  [name] WUJ:[id number] DOB: 23-Mar-1927 DOA: 01/18/2012 PCP: Carrie Mew, MD    Brief narrative: 78 y.o. Male w/ hx of Hypertension, BPH on Flomax , has been feeling lightheaded on and off for some weeks, recently discontinued his diuretic on the advice of a physician because of this lightheadedness concerned that his pressure was too low. Was feeling particularly lightheaded - family member took his blood pressure and found him to have a systolic in the 60s, and a pulse in the 130s. His doctor was contacted and he was advised to come to the ER. In the Emergency room the pt was found to be in A. fib with rapid ventricular response.  Additionally, the pt suffers with chronic back pains radiating to his chest; diagnosed as DJD; has been treating with naproxen and high-dose aspirin about 650mg  3 times daily; feels his abdominal pain is worse since using NSAIDS.    Assessment/Plan: Afib with rapid ventricular response  Possibly brought about by infection, dehydration and resulting hypotension - HR better controlled on IV Cardizem at 5mg /hr- will transition to oral Cardizem with holding parameters as he is still slighlty hypotenisve. Will transition off of Heparin to Xarelto. He is not a fall risk and does not have a history of heave ETOH abuse - this is confirmed by his son.   Hypotension  Felt to be evidence of significant volume depletion plus a possible bacteremia  - Pt takes Maxzide at home-  hydration status is significantly improved- will cut back on IVF today to 75 mg/hr today  Pyelonephritis vs Prostatis UA is markedly abnormal -  Rocephin initiated on 9/4- will f/u on urine cultures-WBC slightly elevated today-  cont to monitor, will not change Rocephin yet  BPH  Continue medical therapy as long as blood pressure will allow   Normocytic anemia  Iron panel ordered and pendtig  History of hypertension  Not an active issue at present -  holding antihypertensives and diuretics.   Hyperlipidemia  Continue medical therapy   Lumbar degenerative joint disease  Underlying degenerative disease with possible added component of Pyelonephritis- Pt notes his pain was much worse on the right lower back lately.  Pt continued to have severe back pain through the night- - suspect that chest pain may have been related to Rapid A-fib- HR noted on telemetry was in 160s at about 3 AM- It could have also been related to Reflux and the increase in HR may have been related to his immense discomfort- his chest pain has resolved- Upon patient's request,  I have d/c'd the Fentanyl -I have initiated Tramadol for now- will watch for side effects . His "allergy" to Vicodin, Percocet and Morphine is nausea, constipation and feeling lightheaded and therefore is not a true allergy.       Code Status: DNR Family Communication: son at bedside Disposition Plan: cont to follow in SDU while transitioning off on IV Cardizem DVT prophylaxis: Heparin infusion    Consultants: none  Procedures:  none  Antibiotics:  Rocephin- 9/4 >>  HPI/Subjective: Pt alert and sitting up in chair- c/o feeling queasy and asked for the liquid that he receive earlier. No vomiting. He was only able to eat part of his breakfast. Had some trouble with chest pain last night. He felt the Fentanyl patch was causing chest tightness and requested it be removed- he subsequentially received IV Fentanyl which sedated him. He awoke 2 hrs later with severe chest pain for which he received  Tylenol. Pain has resolved and he now is agreeable to try Tramadol for his back pain. No c/o dysuria. Have discussed the pathology of A-fib and explained the need for blood thinners. All questions from patient and son addressed in detail.   Objective: Filed Vitals:   01/20/12 0600 01/20/12 0700 01/20/12 0800 01/20/12 1000  BP: 99/57 101/53 99/61 95/46   Pulse:      Temp:   98.8 F (37.1 C)     TempSrc:   Oral   Resp:      Height:      Weight:      SpO2:   94%     Intake/Output Summary (Last 24 hours) at 01/20/12 1138 Last data filed at 01/20/12 1100  Gross per 24 hour  Intake 4684.4 ml  Output   1150 ml  Net 3534.4 ml    Exam:   General:  Alert, no acute distress  Cardiovascular: IIRR, no murmurs  Respiratory: CTA b/l  Abdomen: soft, Non-tender, non-distended, BS+  Ext: no pitting edema noted, no cyanosis or clubbing  Data Reviewed: Basic Metabolic Panel:  Lab 01/20/12 9811 01/19/12 0612 01/18/12 1837  NA 137 135 143  K 4.2 4.1 4.1  CL 107 102 103  CO2 23 24 30   GLUCOSE 123* 119* 92  BUN 12 12 18   CREATININE 0.65 0.54 0.72  CALCIUM 8.0* 9.0 9.5  MG -- -- --  PHOS -- -- --   Liver Function Tests:  Lab 01/19/12 0612 01/18/12 1837  AST 9 8  ALT 7 8  ALKPHOS 55 54  BILITOT 0.2* 0.1*  PROT 6.2 6.6  ALBUMIN 3.3* 3.6   No results found for this basename: LIPASE:5,AMYLASE:5 in the last 168 hours No results found for this basename: AMMONIA:5 in the last 168 hours CBC:  Lab 01/20/12 0540 01/19/12 0612 01/18/12 1837  WBC 13.0* 10.5 9.5  NEUTROABS -- -- 6.3  HGB 8.9* 10.3* 10.7*  HCT 27.1* 31.4* 33.0*  MCV 95.8 94.9 95.7  PLT 242 297 298   Cardiac Enzymes: No results found for this basename: CKTOTAL:5,CKMB:5,CKMBINDEX:5,TROPONINI:5 in the last 168 hours BNP (last 3 results)  Basename 01/18/12 1901  PROBNP 1909.0*   CBG: No results found for this basename: GLUCAP:5 in the last 168 hours  Recent Results (from the past 240 hour(s))  MRSA PCR SCREENING     Status: Normal   Collection Time   01/18/12 10:28 PM      Component Value Range Status Comment   MRSA by PCR NEGATIVE  NEGATIVE Final      Studies: Reviewed in detail     Scheduled Meds:   . aspirin EC  81 mg Oral Daily  . atorvastatin  40 mg Oral q1800  . cefTRIAXone (ROCEPHIN)  IV  1 g Intravenous Q24H  . diltiazem  60 mg Oral Q6H  . famotidine  20 mg Oral BID  . gabapentin   100 mg Oral TID  . heparin  3,000 Units Intravenous Once  . pantoprazole  40 mg Oral Q1200  . sodium chloride  1,000 mL Intravenous Once  . sodium chloride  250 mL Intravenous Once  . sodium chloride  500 mL Intravenous Once  . sodium chloride  500 mL Intravenous Once  . Tamsulosin HCl  0.4 mg Oral QPC supper  . DISCONTD: fentaNYL  25 mcg Transdermal Q72H   Continuous Infusions:   . sodium chloride 125 mL/hr (01/20/12 0607)  . diltiazem (CARDIZEM) infusion 15 mg/hr (01/20/12 0721)  . heparin 1,800  Units/hr (01/20/12 0840)  . DISCONTD: diltiazem (CARDIZEM) infusion Stopped (01/19/12 1300)    ________________________________________________________________________  Time spent: 40 minutes    [facility]  [facility] Pager [phone] If 8PM-8AM, please contact night-coverage at [email or web address], password [facility] 01/20/2012, 11:38 AM  LOS: 2 days

## 2012-01-21 DIAGNOSIS — N400 Enlarged prostate without lower urinary tract symptoms: Secondary | ICD-10-CM

## 2012-01-21 LAB — CBC
Hemoglobin: 9.2 g/dL — ABNORMAL LOW (ref 13.0–17.0)
MCH: 31 pg (ref 26.0–34.0)
MCHC: 32.3 g/dL (ref 30.0–36.0)
MCV: 96 fL (ref 78.0–100.0)
RBC: 2.97 MIL/uL — ABNORMAL LOW (ref 4.22–5.81)

## 2012-01-21 NOTE — Progress Notes (Addendum)
[facility] Progress Note [place] TEAM 1 - Stepdown/ICU TEAM   Lonie Newsham Corlew WUJ:[id number] DOB: 1927-05-16 DOA: 01/18/2012 PCP: Carrie Mew, MD  Brief narrative: 78 y.o. Male w/ hx of Hypertension, BPH on Flomax , has been feeling lightheaded on and off for some weeks, recently discontinued his diuretic on the advice of a physician because of this lightheadedness concerned that his pressure was too low. Was feeling particularly lightheaded - family member took his blood pressure and found him to have a systolic in the 60s, and a pulse in the 130s. His doctor was contacted and he was advised to come to the ER.  In the Emergency room the pt was found to be in A. fib with rapid ventricular response.  Additionally, the pt suffers with chronic back pains radiating to his chest; diagnosed as DJD; has been treating with naproxen and high-dose aspirin about 650mg  3 times daily; feels his abdominal pain is worse since using NSAIDS.   Assessment/Plan:  Afib with rapid ventricular response  Possibly brought about by infection, dehydration and resulting hypotension - HR controlled on oral Cardizem - transitioned off Heparin to Xarelto - he is not a felt to be a fall risk and does not have a history of heave ETOH abuse - this is confirmed by his son  Hypotension  Due to significant volume depletion plus a possible bacteremia  - takes Maxzide at home -  hydration status is significantly improved - will cut back on IVF today with plan to NSL in AM  E coli Pyelonephritis vs Prostatis UA is markedly abnormal - culture confirms -  Rocephin initiated on 9/4 - will f/u on urine sensitivities - WBC improving   BPH  Continue medical therapy as long as blood pressure will allow   Normocytic anemia  Fe level is low-normal, as is B12 - follow trend - no evidence of blood loss - check guiac as precaution given Xarelto tx  History of hypertension  Not an active issue at present - holding  antihypertensives and diuretics  Hyperlipidemia  Continue medical therapy   Lumbar degenerative joint disease  Underlying degenerative disease with possible added component of Pyelonephritis - improved today - cont to follow with mobilization  Code Status: No CODE BLUE Disposition Plan: stable for transfer to tele bed - begin PT/OT  Consultants: None  Procedures: None  Antibiotics: Rocephin 01/19/2012>>  DVT prophylaxis: Full dose heparin>>xarelto  HPI/Subjective: Denies cp, sob, n/v, f/c, or abdom pain.  Chronic LE edema has returned.     Objective: Blood pressure 103/46, pulse 89, temperature 98 F (36.7 C), temperature source Oral, resp. rate 19, height 5\' 9"  (1.753 m), weight 99 kg (218 lb 4.1 oz), SpO2 96.00%.  Intake/Output Summary (Last 24 hours) at 01/21/12 1207 Last data filed at 01/21/12 1100  Gross per 24 hour  Intake 1742.33 ml  Output      0 ml  Net 1742.33 ml     Exam: General: No acute respiratory distress Lungs: Clear to auscultation bilaterally without wheezes or crackles Cardiovascular: Irregularly irregular but with rate controlled at approximately 75 beats per minute without appreciable murmur gallop or rub Abdomen: Nontender, nondistended, soft, bowel sounds positive, no rebound, no ascites, no appreciable mass Extremities: No significant cyanosis or clubbing; 2+ B LE edema   Data Reviewed: Basic Metabolic Panel:  Lab 01/20/12 9562 01/19/12 0612 01/18/12 1837  NA 137 135 143  K 4.2 4.1 4.1  CL 107 102 103  CO2 23 24 30  GLUCOSE 123* 119* 92  BUN 12 12 18   CREATININE 0.65 0.54 0.72  CALCIUM 8.0* 9.0 9.5  MG -- -- --  PHOS -- -- --   Liver Function Tests:  Lab 01/19/12 0612 01/18/12 1837  AST 9 8  ALT 7 8  ALKPHOS 55 54  BILITOT 0.2* 0.1*  PROT 6.2 6.6  ALBUMIN 3.3* 3.6   CBC:  Lab 01/21/12 0608 01/20/12 0540 01/19/12 0612 01/18/12 1837  WBC 7.6 13.0* 10.5 9.5  NEUTROABS -- -- -- 6.3  HGB 9.2* 8.9* 10.3* 10.7*  HCT 28.5*  27.1* 31.4* 33.0*  MCV 96.0 95.8 94.9 95.7  PLT 259 242 297 298   Recent Results (from the past 240 hour(s))  MRSA PCR SCREENING     Status: Normal   Collection Time   01/18/12 10:28 PM      Component Value Range Status Comment   MRSA by PCR NEGATIVE  NEGATIVE Final   URINE CULTURE     Status: Normal (Preliminary result)   Collection Time   01/19/12  9:22 AM      Component Value Range Status Comment   Specimen Description URINE, CLEAN CATCH   Final    Special Requests NONE   Final    Culture  Setup Time 01/19/2012 10:12   Final    Colony Count >=100,000 COLONIES/ML   Final    Culture ESCHERICHIA COLI   Final    Report Status PENDING   Incomplete      Studies:  Recent x-ray studies have been reviewed in detail by the Attending Physician  Scheduled Meds:  Reviewed in detail by the Attending Physician   Lonia Blood, MD [facility] Office  [phone] Pager [phone]  On-Call/Text Page:      [email or web address]      password [email or web address]  If 7PM-7AM, please contact night-coverage [email or web address] Password [email or web address] 01/21/2012, 12:07 PM   LOS: 3 days

## 2012-01-22 DIAGNOSIS — I5031 Acute diastolic (congestive) heart failure: Secondary | ICD-10-CM

## 2012-01-22 DIAGNOSIS — I509 Heart failure, unspecified: Secondary | ICD-10-CM

## 2012-01-22 DIAGNOSIS — E785 Hyperlipidemia, unspecified: Secondary | ICD-10-CM

## 2012-01-22 LAB — URINE CULTURE: Colony Count: >=100000

## 2012-01-22 LAB — BASIC METABOLIC PANEL
CO2: 26 mEq/L (ref 19–32)
Calcium: 8.7 mg/dL (ref 8.4–10.5)
Creatinine, Ser: 0.56 mg/dL (ref 0.50–1.35)
GFR calc non Af Amer: >90 mL/min (ref >90)
Glucose, Bld: 104 mg/dL — ABNORMAL HIGH (ref 70–99)

## 2012-01-22 LAB — CBC
MCH: 30.7 pg (ref 26.0–34.0)
MCHC: 32.4 g/dL (ref 30.0–36.0)
MCV: 94.9 fL (ref 78.0–100.0)
Platelets: 256 10*3/uL (ref 150–400)
RDW: 14.8 % (ref 11.5–15.5)

## 2012-01-22 MED ORDER — FUROSEMIDE 10 MG/ML IJ SOLN
20.0000 mg | Freq: Two times a day (BID) | INTRAMUSCULAR | Status: DC
  Administered 2012-01-22 – 2012-01-23 (×2): 20 mg via INTRAVENOUS
  Filled 2012-01-22 (×4): qty 2

## 2012-01-22 MED ORDER — CIPROFLOXACIN HCL 500 MG PO TABS
500.0000 mg | ORAL_TABLET | Freq: Two times a day (BID) | ORAL | Status: DC
  Administered 2012-01-23 – 2012-01-24 (×3): 500 mg via ORAL
  Filled 2012-01-22 (×5): qty 1

## 2012-01-22 MED ORDER — FUROSEMIDE 10 MG/ML IJ SOLN
INTRAMUSCULAR | Status: AC
  Administered 2012-01-23: 20 mg via INTRAVENOUS
  Filled 2012-01-22: qty 4

## 2012-01-22 NOTE — Evaluation (Signed)
Physical Therapy Evaluation Patient Details Name: [name] MRN: [id number] DOB: 01-15-1927 Today's Date: 01/22/2012 Time: 4098-1191 PT Time Calculation (min): 18 min  PT Assessment / Plan / Recommendation Clinical Impression  Patient is an 78 yo male admitted with chest tightness, Afib with RVR, hypotension, and kidney infection.  Patient with slight decrease in balance during gait - encouraged use of his cane during ambulation.  Patient will benefit from PT for strengthening, mobility/gait training, and education.  Do not anticipate any f/u PT needs at discharge.    PT Assessment  Patient needs continued PT services    Follow Up Recommendations  No PT follow up;Supervision for mobility/OOB    Barriers to Discharge Decreased caregiver support      Equipment Recommendations  None recommended by PT    Recommendations for Other Services     Frequency Min 3X/week    Precautions / Restrictions Precautions Precautions: None Restrictions Weight Bearing Restrictions: No         Mobility  Bed Mobility Bed Mobility: Rolling Left;Left Sidelying to Sit;Sit to Supine Rolling Left: 6: Modified independent (Device/Increase time);With rail Left Sidelying to Sit: 6: Modified independent (Device/Increase time);With rails;HOB elevated Sit to Supine: 4: Min assist;With rail;HOB elevated Details for Bed Mobility Assistance: Cues to roll to come to sitting.  Assist to raise LE's onto bed when returning to supine Transfers Transfers: Sit to Stand;Stand to Sit Sit to Stand: 5: Supervision;With upper extremity assist;From bed Stand to Sit: 5: Supervision;With upper extremity assist;To bed Details for Transfer Assistance: No cues or physical assist needed.  Supervision for safety. Ambulation/Gait Ambulation/Gait Assistance: 5: Supervision Ambulation Distance (Feet): 175 Feet Assistive device: Other (Comment) (Pushed IV pole ) Ambulation/Gait Assistance Details: Patient reports he holds  on to furniture to walk at home.  Used IV pole for stability in hallway.  Patient with shuffling gait with trunk flexed.  Encouraged patient to stand upright.  Also encouraged him to use his cane at all times during gait. Gait Pattern: Step-through pattern;Decreased stride length;Shuffle;Trunk flexed Gait velocity: Slow gait speed    Exercises     PT Diagnosis: Difficulty walking;Generalized weakness;Acute pain  PT Problem List: Decreased strength;Decreased balance;Decreased mobility;Decreased knowledge of use of DME;Pain;Obesity PT Treatment Interventions: DME instruction;Gait training;Stair training;Functional mobility training;Therapeutic exercise;Balance training;Patient/family education   PT Goals Acute Rehab PT Goals PT Goal Formulation: With patient Time For Goal Achievement: 01/29/12 Potential to Achieve Goals: Good Pt will go Supine/Side to Sit: Independently;with HOB 0 degrees PT Goal: Supine/Side to Sit - Progress: Goal set today Pt will go Sit to Supine/Side: Independently;with HOB 0 degrees PT Goal: Sit to Supine/Side - Progress: Goal set today Pt will go Sit to Stand: with modified independence;with upper extremity assist PT Goal: Sit to Stand - Progress: Goal set today Pt will go Stand to Sit: with modified independence;with upper extremity assist PT Goal: Stand to Sit - Progress: Goal set today Pt will Ambulate: >150 feet;with modified independence;with least restrictive assistive device PT Goal: Ambulate - Progress: Goal set today Pt will Go Up / Down Stairs: 3-5 stairs;with supervision;with rail(s);with least restrictive assistive device PT Goal: Up/Down Stairs - Progress: Goal set today Additional Goals Additional Goal #1: Patient will score > 20/24 on DGI balance assessment to indicate low fall risk. PT Goal: Additional Goal #1 - Progress: Goal set today  Visit Information  Last PT Received On: 01/22/12 Assistance Needed: +1    Subjective Data  Subjective: I  think I am doing pretty good.  I  don't need a walker - I have a cane that I use sometimes at home. Patient Stated Goal: To return home soon   Prior Functioning  Home Living Lives With: Son Available Help at Discharge: Family;Available PRN/intermittently (Son works) Type of Home: House Home Access: Stairs to enter Secretary/administrator of Steps: 3 Entrance Stairs-Rails: Right;Left Home Layout: One level Bathroom Shower/Tub: Health visitor: Standard Home Adaptive Equipment: Straight cane Prior Function Level of Independence: Independent with assistive device(s) Able to Take Stairs?: Yes Driving: Yes (Only during day due to eye sight) Vocation: Retired Comments: Patient with decreased vision due to macular degeneration Communication Communication: 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    Cognition  Overall Cognitive Status: Appears within functional limits for tasks assessed/performed Arousal/Alertness: Awake/alert Orientation Level: Appears intact for tasks assessed Behavior During Session: [facility] for tasks performed    Extremity/Trunk Assessment Right Upper Extremity Assessment RUE ROM/Strength/Tone: WFL for tasks assessed RUE Sensation: WFL - Light Touch Left Upper Extremity Assessment LUE ROM/Strength/Tone: WFL for tasks assessed LUE Sensation: WFL - Light Touch Right Lower Extremity Assessment RLE ROM/Strength/Tone: Deficits RLE ROM/Strength/Tone Deficits: Weakness 4-/5; significant edema noted RLE Sensation: WFL - Light Touch Left Lower Extremity Assessment LLE ROM/Strength/Tone: Deficits LLE ROM/Strength/Tone Deficits: Weakness 4-/5; significant edema noted LLE Sensation: WFL - Light Touch   Balance    End of Session PT - End of Session Equipment Utilized During Treatment: Gait belt Activity Tolerance: Patient tolerated treatment well Patient left: in bed;with call bell/phone within reach Nurse Communication: Mobility status  GP     Vena Austria 01/22/2012, 10:46  AM Durenda Hurt. Renaldo Fiddler, [facility] Acute Rehab Services Pager [phone]

## 2012-01-22 NOTE — Progress Notes (Signed)
[facility] Progress Note   [name] ZOX:[id number] DOB: [birth year] DOA: 01/18/2012 PCP: Carrie Mew, MD  Brief narrative: 78 y.o. Male w/ hx of Hypertension, BPH on Flomax , has been feeling lightheaded on and off for some weeks, recently discontinued his diuretic on the advice of a physician because of this lightheadedness concerned that his pressure was too low. Was feeling particularly lightheaded - family member took his blood pressure and found him to have a systolic in the 60s, and a pulse in the 130s. His doctor was contacted and he was advised to come to the ER.  In the Emergency room the pt was found to be in A. fib with rapid ventricular response.  Additionally, the pt suffers with chronic back pains radiating to his chest; diagnosed as DJD; has been treating with naproxen and high-dose aspirin about 650mg  3 times daily; feels his abdominal pain is worse since using NSAIDS.   Assessment/Plan:  Afib with rapid ventricular response  Possibly brought about by infection, dehydration and resulting hypotension - HR controlled on oral Cardizem - transitioned off Heparin to Xarelto - he is not a felt to be a fall risk and does not have a history of heave ETOH abuse - this is confirmed by his son  Acute diastolic failure Patient's previous echocardiogram noted grade 1 diastolic dysfunction. Given initial hypotension from A. fib, he received IV fluids. His BNP done this morning was mildly elevated at 1613. He was started on IV Lasix. Followup BNP is ordered and he is expected to be able to be discharged tomorrow.  Hypotension  Due to significant volume depletion plus a possible bacteremia  - takes Maxzide at home -  hydration status is significantly improved - will cut back on IVF today with plan to NSL in AM  E coli Pyelonephritis vs Prostatis UA is markedly abnormal - culture confirms -  Rocephin initiated on 9/4 sensitive to Cipro. White count normalized. Will change  to by mouth Cipro BPH  Continue medical therapy as long as blood pressure will allow   Normocytic anemia  Fe level is low-normal, as is B12 - follow trend - no evidence of blood loss - check guiac as precaution given Xarelto tx  History of hypertension  Not an active issue at present - holding antihypertensives and diuretics  Hyperlipidemia  Continue medical therapy   Lumbar degenerative joint disease  Underlying degenerative disease with possible added component of Pyelonephritis - improved today - cont to follow with mobilization  Code Status: No CODE BLUE Disposition Plan: Home tomorrow  Consultants: None  Procedures: None  Antibiotics: IV Rocephin day4  DVT prophylaxis: Full dose heparin>>xarelto  HPI/Subjective: Patient doing well. No complaints. No shortness of breath or chest pain. No complaints of palpitations.     Objective: Blood pressure 110/51, pulse 88, temperature 97.8 F (36.6 C), temperature source Oral, resp. rate 18, height 5\' 9"  (1.753 m), weight 99.61 kg (219 lb 9.6 oz), SpO2 98.00%. No intake or output data in the 24 hours ending 01/22/12 1705   Exam: General: Alert and oriented x3, no acute distress, looks younger than stated age Lungs: Clear to auscultation bilaterally without wheezes or crackles Cardiovascular: Irregularly irregular but with rate controlled at approximately 75 beats per minute without appreciable murmur gallop or rub Abdomen: Nontender, nondistended, soft, bowel sounds positive, no rebound, no ascites, no appreciable mass Extremities: No significant cyanosis or clubbing; 2+ B LE edema   Data Reviewed: Basic Metabolic Panel:  Lab 01/22/12 8119  01/20/12 0540 01/19/12 0612 01/18/12 1837  NA 138 137 135 143  K 3.8 4.2 4.1 4.1  CL 105 107 102 103  CO2 26 23 24 30   GLUCOSE 104* 123* 119* 92  BUN 8 12 12 18   CREATININE 0.56 0.65 0.54 0.72  CALCIUM 8.7 8.0* 9.0 9.5  MG -- -- -- --  PHOS -- -- -- --   Liver Function  Tests:  Lab 01/19/12 0612 01/18/12 1837  AST 9 8  ALT 7 8  ALKPHOS 55 54  BILITOT 0.2* 0.1*  PROT 6.2 6.6  ALBUMIN 3.3* 3.6   CBC:  Lab 01/22/12 0525 01/21/12 0608 01/20/12 0540 01/19/12 0612 01/18/12 1837  WBC 6.2 7.6 13.0* 10.5 9.5  NEUTROABS -- -- -- -- 6.3  HGB 9.1* 9.2* 8.9* 10.3* 10.7*  HCT 28.1* 28.5* 27.1* 31.4* 33.0*  MCV 94.9 96.0 95.8 94.9 95.7  PLT 256 259 242 297 298   Recent Results (from the past 240 hour(s))  MRSA PCR SCREENING     Status: Normal   Collection Time   01/18/12 10:28 PM      Component Value Range Status Comment   MRSA by PCR NEGATIVE  NEGATIVE Final   URINE CULTURE     Status: Normal   Collection Time   01/19/12  9:22 AM      Component Value Range Status Comment   Specimen Description URINE, CLEAN CATCH   Final    Special Requests NONE   Final    Culture  Setup Time 01/19/2012 10:12   Final    Colony Count >=100,000 COLONIES/ML   Final    Culture ESCHERICHIA COLI   Final    Report Status 01/22/2012 FINAL   Final    Organism ID, Bacteria ESCHERICHIA COLI   Final       Scheduled Meds:  Reviewed   Virginia Rochester, MD [facility] Office  [phone] Pager [phone]  On-Call/Text Page:      [email or web address]      password [email or web address]  If 7PM-7AM, please contact night-coverage [email or web address] Password [email or web address] 01/22/2012, 5:05 PM   LOS: 4 days

## 2012-01-23 DIAGNOSIS — R0789 Other chest pain: Secondary | ICD-10-CM

## 2012-01-23 LAB — PRO B NATRIURETIC PEPTIDE: Pro B Natriuretic peptide (BNP): 1194 pg/mL — ABNORMAL HIGH (ref 0–450)

## 2012-01-23 LAB — BASIC METABOLIC PANEL
BUN: 10 mg/dL (ref 6–23)
Calcium: 9.1 mg/dL (ref 8.4–10.5)
GFR calc Af Amer: >90 mL/min (ref >90)
GFR calc non Af Amer: >90 mL/min (ref >90)
Glucose, Bld: 99 mg/dL (ref 70–99)
Sodium: 140 mEq/L (ref 135–145)

## 2012-01-23 MED ORDER — METOPROLOL TARTRATE 25 MG PO TABS
25.0000 mg | ORAL_TABLET | Freq: Two times a day (BID) | ORAL | Status: DC
  Administered 2012-01-23 (×2): 25 mg via ORAL
  Filled 2012-01-23 (×4): qty 1

## 2012-01-23 MED ORDER — POTASSIUM CHLORIDE CRYS ER 20 MEQ PO TBCR
20.0000 meq | EXTENDED_RELEASE_TABLET | Freq: Once | ORAL | Status: AC
  Administered 2012-01-23: 20 meq via ORAL
  Filled 2012-01-23: qty 1

## 2012-01-23 MED ORDER — DILTIAZEM HCL 60 MG PO TABS
60.0000 mg | ORAL_TABLET | Freq: Three times a day (TID) | ORAL | Status: DC
  Administered 2012-01-23 – 2012-01-24 (×3): 60 mg via ORAL
  Filled 2012-01-23 (×6): qty 1

## 2012-01-23 NOTE — Progress Notes (Signed)
Pt has a k level of 3.4. Do we need to supplement pt prior to discharge? Thanks Ancil Linsey RN

## 2012-01-23 NOTE — Progress Notes (Signed)
[facility] Progress Note   [name] [name] ZOX:[id number] DOB: 1926/11/12 DOA: 01/18/2012 PCP: Carrie Mew, MD  Brief narrative: 78 y.o. Male w/ hx of Hypertension, BPH on Flomax , has been feeling lightheaded on and off for some weeks, recently discontinued his diuretic on the advice of a physician because of this lightheadedness concerned that his pressure was too low. Was feeling particularly lightheaded - family member took his blood pressure and found him to have a systolic in the 60s, and a pulse in the 130s. His doctor was contacted and he was advised to come to the ER.  In the Emergency room the pt was found to be in A. fib with rapid ventricular response.  Additionally, the pt suffers with chronic back pains radiating to his chest; diagnosed as DJD; has been treating with naproxen and high-dose aspirin about 650mg  3 times daily; feels his abdominal pain is worse since using NSAIDS.   Assessment/Plan:  Afib with rapid ventricular response  Possibly brought about by infection, dehydration and resulting hypotension -  transitioned off Heparin to Xarelto -patient  Is actually quite mobile and is not considered to be a fall risk.this morning the patient was having some chest discomfort and shortness of breath. He was evaluated and is borderline tachycardic so we have added some beta blocker while titrating Cardizem by mouth.   acute diastolic failure  Patient's previous echocardiogram noted grade 1 diastolic dysfunction. Given initial hypotension from A. fib, he received IV fluids. His BNP done this morning was mildly elevated at 1613. He was started on IV Lasix. Followup BNP is ordered and he is expected to be able to be discharged tomorrow.  Hypotension  Due to significant volume depletion plus a possible bacteremia  - takes Maxzide at home -  hydration status is significantly improved - will cut back on IVF today with plan to NSL in AM  E coli Pyelonephritis vs  Prostatis UA is markedly abnormal - culture confirms -  Rocephin initiated on 9/4 sensitive to Cipro. White count normalized. Will change to by mouth Cipro started today  BPH  Continue medical therapy as long as blood pressure will allow   Normocytic anemia  Fe level is low-normal, as is B12 - follow trend - no evidence of blood loss - check guiac as precaution given Xarelto tx  History of hypertension  Not an active issue at present - holding antihypertensives and diuretics  Hyperlipidemia  Continue medical therapy   Lumbar degenerative joint disease  Underlying degenerative disease with possible added component of Pyelonephritis - improved today - cont to follow with mobilization  Code Status: No CODE BLUE Disposition Plan: Home tomorrow. Needed to keep the next today because of getting rate control   Consultants: None  Procedures: None  Antibiotics:  Cipro day 1, total 5 days of antibiotics   DVT prophylaxis: Full dose heparin>>xarelto  HPI/Subjective:  patient feeling better. He did have some chest discomfort and shortness of breath as well as back pain earlier. Back pain is chronic-from DJD. On oxygen and better rate control, his breathing and chest discomfort at this up. EKG was unremarkable and I feel that this is likely from some mild RVR from his A. fib   Objective: Blood pressure 109/68, pulse 86, temperature 98.8 F (37.1 C), temperature source Oral, resp. rate 20, height 5\' 9"  (1.753 m), weight 99.61 kg (219 lb 9.6 oz), SpO2 96.00%.  Intake/Output Summary (Last 24 hours) at 01/23/12 1135 Last data filed at 01/22/12 1900  Gross per 24 hour  Intake    240 ml  Output      0 ml  Net    240 ml     Exam: General: Alert and oriented x3, no acute distress, looks younger than stated age Lungs: Clear to auscultation bilaterally without wheezes or crackles Cardiovascular: Irregularly irregular , borderline tachycardia with a heart rate of 101  Abdomen:  Nontender, nondistended, soft, bowel sounds positive, no rebound, no ascites, no appreciable mass Extremities: No significant cyanosis or clubbing; 2+ B LE edema   Data Reviewed: Basic Metabolic Panel:  Lab 01/23/12 1610 01/22/12 0525 01/20/12 0540 01/19/12 0612 01/18/12 1837  NA 140 138 137 135 143  K 3.4* 3.8 4.2 4.1 4.1  CL 102 105 107 102 103  CO2 29 26 23 24 30   GLUCOSE 99 104* 123* 119* 92  BUN 10 8 12 12 18   CREATININE 0.61 0.56 0.65 0.54 0.72  CALCIUM 9.1 8.7 8.0* 9.0 9.5  MG -- -- -- -- --  PHOS -- -- -- -- --   Liver Function Tests:  Lab 01/19/12 0612 01/18/12 1837  AST 9 8  ALT 7 8  ALKPHOS 55 54  BILITOT 0.2* 0.1*  PROT 6.2 6.6  ALBUMIN 3.3* 3.6   CBC:  Lab 01/22/12 0525 01/21/12 0608 01/20/12 0540 01/19/12 0612 01/18/12 1837  WBC 6.2 7.6 13.0* 10.5 9.5  NEUTROABS -- -- -- -- 6.3  HGB 9.1* 9.2* 8.9* 10.3* 10.7*  HCT 28.1* 28.5* 27.1* 31.4* 33.0*  MCV 94.9 96.0 95.8 94.9 95.7  PLT 256 259 242 297 298   Recent Results (from the past 240 hour(s))  MRSA PCR SCREENING     Status: Normal   Collection Time   01/18/12 10:28 PM      Component Value Range Status Comment   MRSA by PCR NEGATIVE  NEGATIVE Final   URINE CULTURE     Status: Normal   Collection Time   01/19/12  9:22 AM      Component Value Range Status Comment   Specimen Description URINE, CLEAN CATCH   Final    Special Requests NONE   Final    Culture  Setup Time 01/19/2012 10:12   Final    Colony Count >=100,000 COLONIES/ML   Final    Culture ESCHERICHIA COLI   Final    Report Status 01/22/2012 FINAL   Final    Organism ID, Bacteria ESCHERICHIA COLI   Final       Scheduled Meds:  Reviewed   Virginia Rochester, MD [facility] Office  [phone] Pager [phone]  On-Call/Text Page:      [email or web address]      password [email or web address]  If 7PM-7AM, please contact night-coverage [email or web address] Password [email or web address] 01/23/2012, 11:35 AM   LOS: 5 days

## 2012-01-23 NOTE — Progress Notes (Signed)
Pt had c/o chest tightness /? Chest pain lst HS . He was given APAP which had decreased pain to 3-4/10. Chest tightness  with SOB  ongoing. Pt states he is not ready to be discharged if it is an ongoing pain /chest tightness. Thanks! Ancil Linsey RN

## 2012-01-24 ENCOUNTER — Ambulatory Visit: Payer: Medicare Other | Admitting: Internal Medicine

## 2012-01-24 MED ORDER — GABAPENTIN 100 MG PO CAPS: 100.0000 mg | ORAL_CAPSULE | Freq: Three times a day (TID) | ORAL | Status: DC

## 2012-01-24 MED ORDER — RIVAROXABAN 15 MG PO TABS: 15.0000 mg | ORAL_TABLET | Freq: Two times a day (BID) | ORAL | Status: DC

## 2012-01-24 MED ORDER — METOPROLOL TARTRATE 50 MG PO TABS: 50.0000 mg | ORAL_TABLET | Freq: Two times a day (BID) | ORAL | Status: DC

## 2012-01-24 MED ORDER — METOPROLOL TARTRATE 50 MG PO TABS
50.0000 mg | ORAL_TABLET | Freq: Two times a day (BID) | ORAL | Status: DC
  Administered 2012-01-24: 50 mg via ORAL
  Filled 2012-01-24 (×2): qty 1

## 2012-01-24 MED ORDER — RIVAROXABAN 20 MG PO TABS: 20.0000 mg | ORAL_TABLET | Freq: Every day | ORAL | Status: DC

## 2012-01-24 MED ORDER — CIPROFLOXACIN HCL 500 MG PO TABS: 500.0000 mg | ORAL_TABLET | Freq: Two times a day (BID) | ORAL | Status: AC

## 2012-01-24 NOTE — Progress Notes (Signed)
Pt being d/c'd today.  Spoke with pt. Pt does not feel he needs OT.  He is I with all basic adls and mobility.  Will d/c eval. Thirza Pellicano,OTR/l [phone]

## 2012-01-24 NOTE — Progress Notes (Signed)
Physical Therapy Treatment Patient Details Name: [name] MRN: [id number] DOB: 12/21/1926 Today's Date: 01/24/2012 Time: 1478-2956 PT Time Calculation (min): 11 min  PT Assessment / Plan / Recommendation Comments on Treatment Session  Pt being d/c'd by MD today.  Pt and family report mobility better than PTA and feel no need for f/u PT at home.  Discussed home safety and to use cane as needed to prevent holding onto furniture.      Follow Up Recommendations  No PT follow up;Supervision for mobility/OOB    Barriers to Discharge        Equipment Recommendations  None recommended by PT    Recommendations for Other Services    Frequency Min 3X/week   Plan Discharge plan remains appropriate;Frequency remains appropriate    Precautions / Restrictions Precautions Precautions: None Restrictions Weight Bearing Restrictions: No   Pertinent Vitals/Pain No c/o pain.    Mobility  Bed Mobility Bed Mobility: Supine to Sit;Sit to Supine Supine to Sit: 5: Supervision;HOB flat Sit to Supine: 4: Min assist;HOB flat Details for Bed Mobility Assistance: Assist needed for LE's to supine. Transfers Transfers: Sit to Stand;Stand to Sit Sit to Stand: 5: Supervision;With upper extremity assist;From bed Stand to Sit: 5: Supervision;With upper extremity assist;To bed Details for Transfer Assistance: No cues or physical assist needed.  Supervision for safety. Ambulation/Gait Ambulation/Gait Assistance: 5: Supervision Ambulation Distance (Feet): 200 Feet Assistive device: None Ambulation/Gait Assistance Details: Pt's gait steady.  Tends to shuffle LE's-reports this occurred PTA.  No LOB during ambulation Gait Pattern: Step-through pattern;Shuffle Stairs: No        PT Goals Acute Rehab PT Goals PT Goal: Supine/Side to Sit - Progress: Progressing toward goal PT Goal: Sit to Supine/Side - Progress: Progressing toward goal PT Goal: Sit to Stand - Progress: Progressing toward goal PT  Goal: Stand to Sit - Progress: Progressing toward goal PT Goal: Ambulate - Progress: Progressing toward goal  Visit Information  Last PT Received On: 01/24/12 Assistance Needed: +1    Subjective Data  Subjective: "I have to watch out for my cat.  It gets right in front of me when I walk."   Cognition  Overall Cognitive Status: Appears within functional limits for tasks assessed/performed Arousal/Alertness: Awake/alert Orientation Level: Appears intact for tasks assessed Behavior During Session: [facility] for tasks performed        End of Session PT - End of Session Activity Tolerance: Patient tolerated treatment well Patient left: in bed;with call bell/phone within reach;with family/visitor present    Newell Coral 01/24/2012, 11:35 AM  Newell Coral, PTA Acute Rehab [phone] (office)

## 2012-01-24 NOTE — Discharge Summary (Signed)
Physician Discharge Summary  [name] XBJ:[id number] DOB: 10-07-1926 DOA: 01/18/2012  PCP: Carrie Mew, MD  Admit date: 01/18/2012 Discharge date: 01/24/2012  Recommendations for Outpatient Follow-up:  1. Patient will continue loading dose for Xarelto at 50 mg by mouth twice a day. As we increased to 20 mg by mouth daily on 9/27. 2. Patient will follow up with his primary care physician in the next few weeks  Discharge Diagnoses:  Principal Problem:  *Acute diastolic CHF (congestive heart failure) Active Problems:  HYPERLIPIDEMIA  HYPERTENSION  ASTHMA  BENIGN PROSTATIC HYPERTROPHY  Chest tightness  Dizziness  Hypotension  Atrial fibrillation with RVR  DJD (degenerative joint disease), lumbar  Abdominal pain  Pyelonephritis, acute   Discharge Condition: Improved, being discharged home  Diet recommendation: Heart healthy  Filed Weights   01/21/12 0500 01/22/12 0400 01/22/12 0500  Weight: 99 kg (218 lb 4.1 oz) 101.606 kg (224 lb) 99.61 kg (219 lb 9.6 oz)    History of present illness:  78 y.o. Male w/ hx of Hypertension, BPH on Flomax , has been feeling lightheaded on and off for some weeks, recently discontinued his diuretic on the advice of a physician because of this lightheadedness concerned that his pressure was too low. Was feeling particularly lightheaded - family member took his blood pressure and found him to have a systolic in the 60s, and a pulse in the 130s. His doctor was contacted and he was advised to come to the ER. In the Emergency room the pt was found to be in A. fib with rapid ventricular response.  Hospital Course:  Afib with rapid ventricular response  Possibly brought about by infection, dehydration and resulting hypotension - transitioned off Heparin to Xarelto -patient Is actually quite mobile and is not considered to be a fall risk.this morning the patient was having some chest discomfort and shortness of breath. He was evaluated and is  borderline tachycardic so we have added some beta blocker while titrating Cardizem by mouth. By day of discharge, we titrated up his beta blocker to 50 twice a day and discontinued his Cardizem. Patient was value by physical therapy and found to be quite active and did not even need home health PT. Plan of care was discussed with daughter who cares for the patient.  acute diastolic failure  Patient's previous echocardiogram noted grade 1 diastolic dysfunction. Given initial hypotension from A. fib, he received IV fluids. His BNP done this morning was mildly elevated at 1613. He was started on IV Lasix. By the following day, he was down to 1194. Discontinue IV Lasix until discharge. At this point is felt to be equivocal volume Hypotension  Due to significant volume depletion plus a possible bacteremia - takes Maxzide at home - hydration status is significantly improved - see above for treatment of volume overload.  E coli Pyelonephritis vs Prostatis  UA is markedly abnormal - culture confirms - Rocephin initiated on 9/4 sensitive to Cipro. White count normalized. Will change to by mouth Cipro started today and patient will continue for 2 more days to complete a total 7 days of therapy.  BPH  Continue medical therapy as long as blood pressure will allow   Normocytic anemia  Fe level is low-normal, as is B12 - follow trend - no evidence of blood loss - check guiac as precaution given Xarelto tx.hemoglobin and unchanged her hospitalization.    History of hypertension  Not an active issue at present - holding antihypertensives and diuretics. Patient received diuresis  to auscultation. Pressures are stable. Resume home meds on discharge.  Hyperlipidemia  Continue medical therapy   Lumbar degenerative joint disease  Underlying degenerative disease with possible added component of Pyelonephritis - improved today, Ultram helped this. His chronic medical issues.   Procedures:  None    Consultations:  None   Discharge Exam: Filed Vitals:   01/23/12 2103 01/23/12 2130 01/24/12 0500 01/24/12 1144  BP: 120/68 101/65 102/59 106/52  Pulse: 90 86 85   Temp:  98.8 F (37.1 C) 97.8 F (36.6 C)   TempSrc:  Oral Oral   Resp: 20 18 18    Height:      Weight:      SpO2:  97% 95%     General: Alert and oriented x3, no acute distress  Cardiovascular:Irregular rhythm, rate controlled  Respiratory: Clear to auscultation Bilaterally   abdomen: Soft, nontender, nondistended, positive bowel sounds Extremities: No clubbing or cyanosis or edema  Discharge Instructions  Discharge Orders    Future Orders Please Complete By Expires   Diet - low sodium heart healthy      Increase activity slowly        Medication List  As of 01/24/2012  2:59 PM   STOP taking these medications         aspirin 325 MG EC tablet      naproxen sodium 220 MG tablet         TAKE these medications         acetaminophen 500 MG tablet   Commonly known as: TYLENOL   Take 1,000 mg by mouth 2 (two) times daily as needed. For pain      albuterol 108 (90 BASE) MCG/ACT inhaler   Commonly known as: PROVENTIL HFA;VENTOLIN HFA   Inhale 1 puff into the lungs every 6 (six) hours as needed. For bronchial spasms      Calcium 600+D 600-400 MG-UNIT per tablet   Generic drug: Calcium Carbonate-Vitamin D   Take 1 tablet by mouth daily.      ciprofloxacin 500 MG tablet   Commonly known as: CIPRO   Take 1 tablet (500 mg total) by mouth 2 (two) times daily.      gabapentin 100 MG capsule   Commonly known as: NEURONTIN   Take 1 capsule (100 mg total) by mouth 3 (three) times daily.      metoprolol 50 MG tablet   Commonly known as: LOPRESSOR   Take 1 tablet (50 mg total) by mouth 2 (two) times daily.      Ocuvite PreserVision Tabs   Take 2 tablets by mouth daily.      Rivaroxaban 15 MG Tabs tablet   Commonly known as: XARELTO   Take 1 tablet (15 mg total) by mouth 2 (two) times daily.       Rivaroxaban 20 MG Tabs   Commonly known as: XARELTO   Take 1 tablet (20 mg total) by mouth daily. Start on 9/27   Start taking on: 02/11/2012      rosuvastatin 20 MG tablet   Commonly known as: CRESTOR   Take 20 mg by mouth once a week. On Fridays      Tamsulosin HCl 0.4 MG Caps   Commonly known as: FLOMAX   Take 0.4 mg by mouth daily after supper.      triamterene-hydrochlorothiazide 37.5-25 MG per tablet   Commonly known as: MAXZIDE-25   Take 0.5 tablets by mouth daily.      zolpidem 10 MG tablet  Commonly known as: AMBIEN   Take 5 mg by mouth at bedtime as needed. May take another 1/2 tablet if needed           Follow-up Information    Follow up with Carrie Mew, MD. Schedule an appointment as soon as possible for a visit in 2 weeks.   Contact information:   [address] [place] [address] [phone]           The results of significant diagnostics from this hospitalization (including imaging, microbiology, ancillary and laboratory) are listed below for reference.    Significant Diagnostic Studies:   Ct Angio Abdomen W/cm &/or Wo Contrast  01/19/2012    IMPRESSION: Scattered atherosclerotic disease changes without evidence of aortic dissection or aneurysm. No evidence of pulmonary embolism. Minimal peripheral infiltrate right upper and left lower lobes. Minimal pericardial effusion. Osseous demineralization without definite acute thoracic spine abnormality.    CTA ABDOMEN AND PELVIS IMPRESSION: Probable tiny intravesicular calculi and peripelvic left renal cysts cysts. Scattered atherosclerotic changes in abdomen without aneurysm or dissection. No definite acute intra abdominal or intrapelvic abnormalities.   Original Report Authenticated By: Lollie Marrow, M.D.     Dg Chest Portable 1 View  01/18/2012 IMPRESSION: Cardiomegaly.  Tortuous calcified aorta.  No infiltrate, congestive heart failure or pneumothorax.   Original Report  Authenticated By: Fuller Canada, M.D.      Microbiology: Recent Results (from the past 240 hour(s))  MRSA PCR SCREENING     Status: Normal   Collection Time   01/18/12 10:28 PM      Component Value Range Status Comment   MRSA by PCR NEGATIVE  NEGATIVE Final   URINE CULTURE     Status: Normal   Collection Time   01/19/12  9:22 AM      Component Value Range Status Comment   Specimen Description URINE, CLEAN CATCH   Final    Special Requests NONE   Final    Culture  Setup Time 01/19/2012 10:12   Final    Colony Count >=100,000 COLONIES/ML   Final    Culture ESCHERICHIA COLI   Final    Report Status 01/22/2012 FINAL   Final    Organism ID, Bacteria ESCHERICHIA COLI   Final      Labs: Basic Metabolic Panel:  Lab 01/23/12 9147 01/22/12 0525 01/20/12 0540 01/19/12 0612 01/18/12 1837  NA 140 138 137 135 143  K 3.4* 3.8 4.2 4.1 4.1  CL 102 105 107 102 103  CO2 29 26 23 24 30   GLUCOSE 99 104* 123* 119* 92  BUN 10 8 12 12 18   CREATININE 0.61 0.56 0.65 0.54 0.72  CALCIUM 9.1 8.7 8.0* 9.0 9.5  MG -- -- -- -- --  PHOS -- -- -- -- --   Liver Function Tests:  Lab 01/19/12 0612 01/18/12 1837  AST 9 8  ALT 7 8  ALKPHOS 55 54  BILITOT 0.2* 0.1*  PROT 6.2 6.6  ALBUMIN 3.3* 3.6   No results found for this basename: LIPASE:5,AMYLASE:5 in the last 168 hours No results found for this basename: AMMONIA:5 in the last 168 hours CBC:  Lab 01/22/12 0525 01/21/12 0608 01/20/12 0540 01/19/12 0612 01/18/12 1837  WBC 6.2 7.6 13.0* 10.5 9.5  NEUTROABS -- -- -- -- 6.3  HGB 9.1* 9.2* 8.9* 10.3* 10.7*  HCT 28.1* 28.5* 27.1* 31.4* 33.0*  MCV 94.9 96.0 95.8 94.9 95.7  PLT 256 259 242 297 298   Cardiac  Enzymes:  Lab 01/23/12 1100  CKTOTAL --  CKMB --  CKMBINDEX --  TROPONINI <0.30   BNP: BNP (last 3 results)  Basename 01/23/12 1100 01/22/12 0948 01/18/12 1901  PROBNP 1194.0* 1613.0* 1909.0*    Time coordinating discharge: 35 minutes  Signed:  Hollice Espy  [facility] 01/24/2012, 2:59 PM

## 2012-01-25 ENCOUNTER — Telehealth: Payer: Self-pay | Admitting: Internal Medicine

## 2012-01-25 NOTE — Telephone Encounter (Signed)
Pt just got out of the hospital and will not schedule an appt until he speaks with you. Please contact pt

## 2012-01-25 NOTE — Telephone Encounter (Signed)
Appointment for Friday at 11:30

## 2012-01-28 ENCOUNTER — Ambulatory Visit (INDEPENDENT_AMBULATORY_CARE_PROVIDER_SITE_OTHER): Payer: Medicare Other | Admitting: Internal Medicine

## 2012-01-28 ENCOUNTER — Encounter: Payer: Self-pay | Admitting: Internal Medicine

## 2012-01-28 VITALS — BP 100/60 | HR 84 | Temp 98.2°F | Resp 18 | Ht 69.0 in | Wt 202.0 lb

## 2012-01-28 DIAGNOSIS — IMO0002 Reserved for concepts with insufficient information to code with codable children: Secondary | ICD-10-CM

## 2012-01-28 DIAGNOSIS — R609 Edema, unspecified: Secondary | ICD-10-CM

## 2012-01-28 DIAGNOSIS — R600 Localized edema: Secondary | ICD-10-CM

## 2012-01-28 DIAGNOSIS — I509 Heart failure, unspecified: Secondary | ICD-10-CM

## 2012-01-28 DIAGNOSIS — M5410 Radiculopathy, site unspecified: Secondary | ICD-10-CM

## 2012-01-28 DIAGNOSIS — N411 Chronic prostatitis: Secondary | ICD-10-CM

## 2012-01-28 MED ORDER — TIOTROPIUM BROMIDE MONOHYDRATE 18 MCG IN CAPS: 18.0000 ug | ORAL_CAPSULE | Freq: Every day | RESPIRATORY_TRACT | Status: DC

## 2012-01-28 MED ORDER — METHYLPREDNISOLONE (PAK) 4 MG PO TABS: ORAL_TABLET | ORAL | Status: AC

## 2012-01-28 MED ORDER — BUPRENORPHINE 10 MCG/HR TD PTWK: 10.0000 ug | MEDICATED_PATCH | TRANSDERMAL | Status: DC

## 2012-01-28 MED ORDER — CIPROFLOXACIN HCL 500 MG PO TABS: 500.0000 mg | ORAL_TABLET | Freq: Two times a day (BID) | ORAL | Status: AC

## 2012-01-28 MED ORDER — ESOMEPRAZOLE MAGNESIUM 40 MG PO CPDR: 40.0000 mg | DELAYED_RELEASE_CAPSULE | Freq: Every day | ORAL | Status: DC

## 2012-01-28 NOTE — Progress Notes (Signed)
Subjective:    Patient ID: [name], male    DOB: 1926-06-16, 78 y.o.   MRN: [id number]  HPI  Constipated with lower abdominal pain and smaller BM's New onset AF on novel anticoagulant Hypotension due to dehydration Increased low back pain The albuterol increased heart rate   Review of Systems  Constitutional: Negative for fever and fatigue.  HENT: Negative for hearing loss, congestion, neck pain and postnasal drip.   Eyes: Negative for discharge, redness and visual disturbance.  Respiratory: Negative for cough, shortness of breath and wheezing.   Cardiovascular: Negative for leg swelling.  Gastrointestinal: Negative for abdominal pain, constipation and abdominal distention.  Genitourinary: Negative for urgency and frequency.  Musculoskeletal: Negative for joint swelling and arthralgias.  Skin: Negative for color change and rash.  Neurological: Negative for weakness and light-headedness.  Hematological: Negative for adenopathy.  Psychiatric/Behavioral: Negative for behavioral problems.   Past Medical History  Diagnosis Date  . Obesity   . Hypertension   . Edema   . BPH (benign prostatic hypertrophy)   . Asthma   . Bronchospasm, exercise-induced   . Chronic kidney disease   . Arthritis     History   Social History  . Marital Status: Married    Spouse Name: N/A    Number of Children: N/A  . Years of Education: N/A   Occupational History  . retired    Social History Main Topics  . Smoking status: Former Smoker    Quit date: 05/17/1964  . Smokeless tobacco: Not on file  . Alcohol Use: No  . Drug Use: No  . Sexually Active: Not Currently   Other Topics Concern  . Not on file   Social History Narrative  . No narrative on file    Past Surgical History  Procedure Date  . Vascular surgery removed varicose veins    Family History  Problem Relation Age of Onset  . Alzheimer's disease Mother   . Heart disease Father     Allergies  Allergen  Reactions  . Vicodin (Hydrocodone-Acetaminophen) Other (See Comments)    Unknown reaction many years ago  . Morphine And Related Nausea And Vomiting    Current Outpatient Prescriptions on File Prior to Visit  Medication Sig Dispense Refill  . acetaminophen (TYLENOL) 500 MG tablet Take 1,000 mg by mouth 2 (two) times daily as needed. For pain      . albuterol (PROVENTIL HFA;VENTOLIN HFA) 108 (90 BASE) MCG/ACT inhaler Inhale 1 puff into the lungs every 6 (six) hours as needed. For bronchial spasms      . Calcium Carbonate-Vitamin D (CALCIUM 600+D) 600-400 MG-UNIT per tablet Take 1 tablet by mouth daily.      [name] gabapentin (NEURONTIN) 100 MG capsule Take 1 capsule (100 mg total) by mouth 3 (three) times daily.  90 capsule  0  . metoprolol (LOPRESSOR) 50 MG tablet Take 1 tablet (50 mg total) by mouth 2 (two) times daily.  60 tablet  0  . Multiple Vitamins-Minerals (OCUVITE PRESERVISION) TABS Take 2 tablets by mouth daily.        . Rivaroxaban (XARELTO) 15 MG TABS tablet Take 1 tablet (15 mg total) by mouth 2 (two) times daily.  35 tablet  0  . Rivaroxaban (XARELTO) 20 MG TABS Take 1 tablet (20 mg total) by mouth daily. Start on 9/27  30 tablet  0  . rosuvastatin (CRESTOR) 20 MG tablet Take 20 mg by mouth once a week. On Fridays      .  Tamsulosin HCl (FLOMAX) 0.4 MG CAPS Take 0.4 mg by mouth daily after supper.      . zolpidem (AMBIEN) 10 MG tablet Take 5 mg by mouth at bedtime as needed. May take another 1/2 tablet if needed      . buprenorphine (BUTRANS) 10 MCG/HR PTWK Place 1 patch (10 mcg total) onto the skin once a week.  1 patch  5  . esomeprazole (NEXIUM) 40 MG capsule Take 1 capsule (40 mg total) by mouth daily.  30 capsule  3  . tiotropium (SPIRIVA HANDIHALER) 18 MCG inhalation capsule Place 1 capsule (18 mcg total) into inhaler and inhale daily.  30 capsule  12    BP 100/60  Pulse 84  Temp 98.2 F (36.8 C)  Resp 18  Ht 5\' 9"  (1.753 m)  Wt 202 lb (91.627 kg)  BMI 29.83 kg/m2        Objective:   Physical Exam  Nursing note and vitals reviewed. Constitutional: He appears well-developed and well-nourished.  HENT:  Head: Normocephalic and atraumatic.  Eyes: Conjunctivae normal are normal. Pupils are equal, round, and reactive to light.  Neck: Normal range of motion. Neck supple.  Cardiovascular: Normal rate and regular rhythm.   Pulmonary/Chest: Effort normal and breath sounds normal.  Abdominal: Soft. Bowel sounds are normal.  pale  in apparent pain        Assessment & Plan:  Post hospital admission for hypotension and dehydration in the setting of new onset AF Patient's hospital and discharge summaries reviewed.  Patient's daughter and son with the patient we discussed the patient's admission his symptomology his comorbid disease findings including diabetes obesity hypertension and now atrial fibrillation.  He has severe degenerative disc disease in the back with severe pain we were in the process of treating that with referral for palliative injection therapy.  To continue with a referral for pain control we have given him pain medications as well as stressed his need to continue with the therapies initiated in the hospital a basic metabolic panel and a hemoglobin A1c were drawn today

## 2012-01-28 NOTE — Patient Instructions (Addendum)
Wear the compression hose  And stop the fluid pill  Replace the pepcid with nexium one a day    Trial of butrans patches

## 2012-01-31 ENCOUNTER — Other Ambulatory Visit: Payer: Self-pay | Admitting: *Deleted

## 2012-01-31 DIAGNOSIS — M5410 Radiculopathy, site unspecified: Secondary | ICD-10-CM

## 2012-01-31 MED ORDER — BUPRENORPHINE 10 MCG/HR TD PTWK: 10.0000 ug | MEDICATED_PATCH | TRANSDERMAL | Status: DC

## 2012-02-02 ENCOUNTER — Telehealth: Payer: Self-pay | Admitting: Internal Medicine

## 2012-02-02 NOTE — Telephone Encounter (Signed)
Pt decline to wait for triage nurse requesting to talk with bonnye concerning new rx

## 2012-02-02 NOTE — Telephone Encounter (Signed)
Concerned about warning signs of butrens-reassured pt and told him to try it and if any signs of side effects, take off immediately and give [place] a call

## 2012-02-04 ENCOUNTER — Telehealth: Payer: Self-pay | Admitting: Internal Medicine

## 2012-02-04 NOTE — Telephone Encounter (Signed)
I called pt back to make appt. Pt stated that he felt he would be fine and did not need appt. Stated that if anything changed, he would call back. He is aware that [name] will be back in office on Tues.

## 2012-02-04 NOTE — Telephone Encounter (Signed)
Caller: [name]/Patient; Patient Name: [name]; PCP: Darryll Capers (Adults only); Best Callback Phone Number: [phone]; Reason for call: dizziness.  States he has been dizzy and nauseated 22 hours after using new Rx/Butrans patch.  States the dizziness/vertigo started after applying the patch, and abated within several hours of stopping the patch.  States concerned about side effect of the patch.  Denies current dizziness, lightheadedness, or rapid heartrate.  Was prescribed a weekly patch to use for chronic back pain.  Currently taking steroids/finished 02/03/12 for the back pain, which has improved in terms of intensity.  Hospitalized x 1 week for atrial fibrillation after suffering dizziness 01/18/12.  Taking gabapentin currently for pain as well.  Per dizziness protocol, emergent symptoms denied; advised being seen within 24 hours.  Unable to schedule appointment with Dr. Lovell Sheehan or Ms. Orvan Falconer; info to office for staff management/callback.   May reach patient at [phone].

## 2012-02-21 ENCOUNTER — Other Ambulatory Visit (HOSPITAL_COMMUNITY): Payer: Self-pay | Admitting: Internal Medicine

## 2012-02-21 ENCOUNTER — Other Ambulatory Visit: Payer: Self-pay | Admitting: Internal Medicine

## 2012-02-22 ENCOUNTER — Other Ambulatory Visit: Payer: Self-pay | Admitting: *Deleted

## 2012-02-22 MED ORDER — ESOMEPRAZOLE MAGNESIUM 40 MG PO CPDR: 40.0000 mg | DELAYED_RELEASE_CAPSULE | Freq: Every day | ORAL | Status: DC

## 2012-02-22 MED ORDER — TIOTROPIUM BROMIDE MONOHYDRATE 18 MCG IN CAPS: 18.0000 ug | ORAL_CAPSULE | Freq: Every day | RESPIRATORY_TRACT | Status: DC

## 2012-02-24 ENCOUNTER — Telehealth: Payer: Self-pay | Admitting: Internal Medicine

## 2012-02-24 MED ORDER — GABAPENTIN 100 MG PO CAPS: 100.0000 mg | ORAL_CAPSULE | Freq: Three times a day (TID) | ORAL | Status: DC

## 2012-02-24 MED ORDER — METOPROLOL TARTRATE 50 MG PO TABS: 50.0000 mg | ORAL_TABLET | Freq: Two times a day (BID) | ORAL | Status: DC

## 2012-02-24 NOTE — Telephone Encounter (Signed)
done

## 2012-02-24 NOTE — Telephone Encounter (Signed)
Pt called and is needing to get refills for two meds that were prescribed by a physician at hosp. Pt needs refills gabapentin (NEURONTIN) 100 MG capsule and  metoprolol (LOPRESSOR) 50 MG tablet to [facility] on [address]. Pt is completely out of meds. Pls call in asap today.

## 2012-02-28 ENCOUNTER — Ambulatory Visit (INDEPENDENT_AMBULATORY_CARE_PROVIDER_SITE_OTHER): Payer: Medicare Other | Admitting: Family

## 2012-02-28 ENCOUNTER — Encounter: Payer: Self-pay | Admitting: Family

## 2012-02-28 VITALS — BP 90/58 | HR 88 | Temp 98.4°F | Wt 232.0 lb

## 2012-02-28 DIAGNOSIS — I509 Heart failure, unspecified: Secondary | ICD-10-CM

## 2012-02-28 DIAGNOSIS — R609 Edema, unspecified: Secondary | ICD-10-CM

## 2012-02-28 DIAGNOSIS — R0602 Shortness of breath: Secondary | ICD-10-CM

## 2012-02-28 LAB — BASIC METABOLIC PANEL
CO2: 29 mEq/L (ref 19–32)
Calcium: 8.9 mg/dL (ref 8.4–10.5)
Creatinine, Ser: 0.8 mg/dL (ref 0.4–1.5)
GFR: 104.15 mL/min (ref >60.00)
Glucose, Bld: 88 mg/dL (ref 70–99)

## 2012-02-28 MED ORDER — CARVEDILOL 3.125 MG PO TABS: 3.1250 mg | ORAL_TABLET | Freq: Two times a day (BID) | ORAL | Status: DC

## 2012-02-28 MED ORDER — FUROSEMIDE 40 MG PO TABS: 40.0000 mg | ORAL_TABLET | Freq: Every day | ORAL | Status: DC

## 2012-02-28 NOTE — Progress Notes (Signed)
Subjective:    Patient ID: [name], male    DOB: August 12, 1926, 78 y.o.   MRN: [id number]  HPI 78 year old white male, nonsmoker, patient of Dr. Lovell Sheehan is in today with c/o shortness or breath, weight gain, and peripheral edema x 2 weeks and worsening. He was taken off of maxide for hypotension and began retaining fluid. He has a history of CHF-diastolic dysfunction.    Review of Systems  Constitutional: Negative.   HENT: Negative.   Respiratory: Positive for shortness of breath. Negative for cough and wheezing.   Cardiovascular: Positive for leg swelling. Negative for chest pain and palpitations.  Genitourinary: Negative.   Musculoskeletal: Negative.   Skin: Negative.   Neurological: Negative.   Hematological: Negative.   Psychiatric/Behavioral: Negative.    Past Medical History  Diagnosis Date  . Obesity   . Hypertension   . Edema   . BPH (benign prostatic hypertrophy)   . Asthma   . Bronchospasm, exercise-induced   . Chronic kidney disease   . Arthritis     History   Social History  . Marital Status: Married    Spouse Name: N/A    Number of Children: N/A  . Years of Education: N/A   Occupational History  . retired    Social History Main Topics  . Smoking status: Former Smoker    Quit date: 05/17/1964  . Smokeless tobacco: Not on file  . Alcohol Use: No  . Drug Use: No  . Sexually Active: Not Currently   Other Topics Concern  . Not on file   Social History Narrative  . No narrative on file    Past Surgical History  Procedure Date  . Vascular surgery removed varicose veins    Family History  Problem Relation Age of Onset  . Alzheimer's disease Mother   . Heart disease Father     Allergies  Allergen Reactions  . Vicodin (Hydrocodone-Acetaminophen) Other (See Comments)    Unknown reaction many years ago  . Morphine And Related Nausea And Vomiting    Current Outpatient Prescriptions on File Prior to Visit  Medication Sig Dispense  Refill  . acetaminophen (TYLENOL) 500 MG tablet Take 1,000 mg by mouth 2 (two) times daily as needed. For pain      . albuterol (PROVENTIL HFA;VENTOLIN HFA) 108 (90 BASE) MCG/ACT inhaler Inhale 1 puff into the lungs every 6 (six) hours as needed. For bronchial spasms      . buprenorphine (BUTRANS) 10 MCG/HR PTWK Place 1 patch (10 mcg total) onto the skin once a week.  4 patch  1  . Calcium Carbonate-Vitamin D (CALCIUM 600+D) 600-400 MG-UNIT per tablet Take 1 tablet by mouth daily.      [name] esomeprazole (NEXIUM) 40 MG capsule Take 1 capsule (40 mg total) by mouth daily.  30 capsule  12  . gabapentin (NEURONTIN) 100 MG capsule Take 1 capsule (100 mg total) by mouth 3 (three) times daily.  90 capsule  1  . metoprolol (LOPRESSOR) 50 MG tablet Take 1 tablet (50 mg total) by mouth 2 (two) times daily.  60 tablet  2  . Multiple Vitamins-Minerals (OCUVITE PRESERVISION) TABS Take 2 tablets by mouth daily.        . Rivaroxaban (XARELTO) 20 MG TABS Take 1 tablet (20 mg total) by mouth daily. Start on 9/27  30 tablet  0  . rosuvastatin (CRESTOR) 20 MG tablet Take 20 mg by mouth once a week. On Fridays      .  Tamsulosin HCl (FLOMAX) 0.4 MG CAPS Take 0.4 mg by mouth daily after supper.      . tiotropium (SPIRIVA HANDIHALER) 18 MCG inhalation capsule Place 1 capsule (18 mcg total) into inhaler and inhale daily.  30 capsule  12  . zolpidem (AMBIEN) 10 MG tablet TAKE ONE TABLET BY MOUTH AT BEDTIME AS NEEDED FOR SLEEP  30 tablet  4  . carvedilol (COREG) 3.125 MG tablet Take 1 tablet (3.125 mg total) by mouth 2 (two) times daily with a meal.  60 tablet  3  . furosemide (LASIX) 40 MG tablet Take 1 tablet (40 mg total) by mouth daily.  30 tablet  3  . Rivaroxaban (XARELTO) 15 MG TABS tablet Take 1 tablet (15 mg total) by mouth 2 (two) times daily.  35 tablet  0    BP 90/58  Pulse 88  Temp 98.4 F (36.9 C) (Oral)  Wt 232 lb (105.235 kg)  SpO2 93%chart    Objective:   Physical Exam  Constitutional: He is oriented  to person, place, and time. He appears well-developed and well-nourished.  HENT:  Right Ear: External ear normal.  Left Ear: External ear normal.  Nose: Nose normal.  Mouth/Throat: Oropharynx is clear and moist.  Neck: Normal range of motion. Neck supple.  Cardiovascular: Normal rate and regular rhythm.   Murmur heard. Pulmonary/Chest: Effort normal.       Mild crackles in lungs. 30 lb weight gain noted  Abdominal: Soft. Bowel sounds are normal.  Musculoskeletal: He exhibits edema.       Ambulating with a cane. 3+ pitting edema.  Neurological: He is alert and oriented to person, place, and time.  Skin: Skin is warm and dry.  Psychiatric: He has a normal mood and affect.          Assessment & Plan:  Assessment: Peripheral Edema, Shortness of Breath  Plan: D/C metoprolol and start Coreg 3.125 twice a day. Start Lasix 40mg  a day. Recheck in 1 week. Compression stockings 10-20. Discussed treatement plan with Dr. Lovell Sheehan. BMP and BNP sent.

## 2012-02-28 NOTE — Patient Instructions (Addendum)
Peripheral Edema  You have swelling in your legs (peripheral edema). This swelling is due to excess accumulation of salt and water in your body. Edema may be a sign of heart, kidney or liver disease, or a side effect of a medication. It may also be due to problems in the leg veins. Elevating your legs and using special support stockings may be very helpful, if the cause of the swelling is due to poor venous circulation. Avoid long periods of standing, whatever the cause.  Treatment of edema depends on identifying the cause. Chips, pretzels, pickles and other salty foods should be avoided. Restricting salt in your diet is almost always needed. Water pills (diuretics) are often used to remove the excess salt and water from your body via urine. These medicines prevent the kidney from reabsorbing sodium. This increases urine flow.  Diuretic treatment may also result in lowering of potassium levels in your body. Potassium supplements may be needed if you have to use diuretics daily. Daily weights can help you keep track of your progress in clearing your edema. You should call your caregiver for follow up care as recommended.  SEEK IMMEDIATE MEDICAL CARE IF:    You have increased swelling, pain, redness, or heat in your legs.   You develop shortness of breath, especially when lying down.   You develop chest or abdominal pain, weakness, or fainting.   You have a fever.  Document Released: 06/10/2004 Document Revised: 07/26/2011 Document Reviewed: 05/21/2009  [employer] Patient Information 2013 [employer].

## 2012-03-06 ENCOUNTER — Telehealth: Payer: Self-pay | Admitting: Internal Medicine

## 2012-03-06 NOTE — Telephone Encounter (Signed)
Pts daughter called to give update and pts condition. Pt seems to be doing better. The diuretic is working. The fluid is decreasing. Pts breathing has gotten somewhat better. Breath is deeper and is not a shallow. A little breathless at exertion. BP and HR are about the same.

## 2012-03-06 NOTE — Telephone Encounter (Signed)
Thought I would send this to you since  You txed him

## 2012-03-17 ENCOUNTER — Encounter: Payer: Self-pay | Admitting: Internal Medicine

## 2012-03-17 ENCOUNTER — Ambulatory Visit (INDEPENDENT_AMBULATORY_CARE_PROVIDER_SITE_OTHER): Payer: Medicare Other | Admitting: Internal Medicine

## 2012-03-17 VITALS — BP 106/70 | HR 72 | Temp 98.2°F | Resp 18 | Ht 69.0 in | Wt 225.0 lb

## 2012-03-17 DIAGNOSIS — R609 Edema, unspecified: Secondary | ICD-10-CM

## 2012-03-17 DIAGNOSIS — I509 Heart failure, unspecified: Secondary | ICD-10-CM

## 2012-03-17 DIAGNOSIS — K219 Gastro-esophageal reflux disease without esophagitis: Secondary | ICD-10-CM

## 2012-03-17 DIAGNOSIS — E119 Type 2 diabetes mellitus without complications: Secondary | ICD-10-CM

## 2012-03-17 LAB — CBC WITH DIFFERENTIAL/PLATELET
Eosinophils Relative: 1 % (ref 0–5)
HCT: 29 % — ABNORMAL LOW (ref 39.0–52.0)
Lymphocytes Relative: 30 % (ref 12–46)
Lymphs Abs: 1.5 10*3/uL (ref 0.7–4.0)
MCV: 88.1 fL (ref 78.0–100.0)
Monocytes Absolute: 0.7 10*3/uL (ref 0.1–1.0)
Monocytes Relative: 14 % — ABNORMAL HIGH (ref 3–12)
RBC: 3.29 MIL/uL — ABNORMAL LOW (ref 4.22–5.81)
RDW: 13.9 % (ref 11.5–15.5)
WBC: 4.9 10*3/uL (ref 4.0–10.5)

## 2012-03-17 MED ORDER — TORSEMIDE 100 MG PO TABS: 50.0000 mg | ORAL_TABLET | Freq: Every day | ORAL | Status: DC

## 2012-03-17 MED ORDER — OMEPRAZOLE 20 MG PO CPDR: 20.0000 mg | DELAYED_RELEASE_CAPSULE | Freq: Every day | ORAL | Status: DC

## 2012-03-17 NOTE — Progress Notes (Signed)
Subjective:    Patient ID: [name], male    DOB: 1927/03/09, 78 y.o.   MRN: [id number]  HPI  Requesting a knee injection for pain Severe back pain has stopped persistant 3+ pitting edema   Review of Systems  Constitutional: Positive for activity change and appetite change.  HENT: Positive for congestion.   Respiratory: Positive for chest tightness and shortness of breath.   Cardiovascular: Positive for leg swelling.  Musculoskeletal: Positive for back pain, joint swelling and gait problem.  Neurological: Positive for weakness and numbness.   Past Medical History  Diagnosis Date  . Obesity   . Hypertension   . Edema   . BPH (benign prostatic hypertrophy)   . Asthma   . Bronchospasm, exercise-induced   . Chronic kidney disease   . Arthritis     History   Social History  . Marital Status: Married    Spouse Name: N/A    Number of Children: N/A  . Years of Education: N/A   Occupational History  . retired    Social History Main Topics  . Smoking status: Former Smoker    Quit date: 05/17/1964  . Smokeless tobacco: Not on file  . Alcohol Use: No  . Drug Use: No  . Sexually Active: Not Currently   Other Topics Concern  . Not on file   Social History Narrative  . No narrative on file    Past Surgical History  Procedure Date  . Vascular surgery removed varicose veins    Family History  Problem Relation Age of Onset  . Alzheimer's disease Mother   . Heart disease Father     Allergies  Allergen Reactions  . Vicodin (Hydrocodone-Acetaminophen) Other (See Comments)    Unknown reaction many years ago  . Morphine And Related Nausea And Vomiting    Current Outpatient Prescriptions on File Prior to Visit  Medication Sig Dispense Refill  . acetaminophen (TYLENOL) 500 MG tablet Take 1,000 mg by mouth 2 (two) times daily as needed. For pain      . albuterol (PROVENTIL HFA;VENTOLIN HFA) 108 (90 BASE) MCG/ACT inhaler Inhale 1 puff into the lungs every  6 (six) hours as needed. For bronchial spasms      . buprenorphine (BUTRANS) 10 MCG/HR PTWK Place 1 patch (10 mcg total) onto the skin once a week.  4 patch  1  . Calcium Carbonate-Vitamin D (CALCIUM 600+D) 600-400 MG-UNIT per tablet Take 1 tablet by mouth daily.      . carvedilol (COREG) 3.125 MG tablet Take 1 tablet (3.125 mg total) by mouth 2 (two) times daily with a meal.  60 tablet  3  . furosemide (LASIX) 40 MG tablet Take 1 tablet (40 mg total) by mouth daily.  30 tablet  3  . gabapentin (NEURONTIN) 100 MG capsule Take 1 capsule (100 mg total) by mouth 3 (three) times daily.  90 capsule  1  . metoprolol (LOPRESSOR) 50 MG tablet Take 1 tablet (50 mg total) by mouth 2 (two) times daily.  60 tablet  2  . Multiple Vitamins-Minerals (OCUVITE PRESERVISION) TABS Take 2 tablets by mouth daily.        [name] omeprazole (PRILOSEC) 20 MG capsule Take 1 capsule (20 mg total) by mouth daily.  30 capsule  3  . Rivaroxaban (XARELTO) 15 MG TABS tablet Take 1 tablet (15 mg total) by mouth 2 (two) times daily.  35 tablet  0  . Rivaroxaban (XARELTO) 20 MG TABS Take 1 tablet (20 mg  total) by mouth daily. Start on 9/27  30 tablet  0  . rosuvastatin (CRESTOR) 20 MG tablet Take 20 mg by mouth once a week. On Fridays      . Tamsulosin HCl (FLOMAX) 0.4 MG CAPS Take 0.4 mg by mouth daily after supper.      . tiotropium (SPIRIVA HANDIHALER) 18 MCG inhalation capsule Place 1 capsule (18 mcg total) into inhaler and inhale daily.  30 capsule  12  . torsemide (DEMADEX) 100 MG tablet Take 0.5 tablets (50 mg total) by mouth daily.  30 tablet  5  . zolpidem (AMBIEN) 10 MG tablet TAKE ONE TABLET BY MOUTH AT BEDTIME AS NEEDED FOR SLEEP  30 tablet  4    BP 106/70  Pulse 72  Temp 98.2 F (36.8 C)  Resp 18  Ht 5\' 9"  (1.753 m)  Wt 225 lb (102.059 kg)  BMI 33.23 kg/m2        Objective:   Physical Exam  Nursing note and vitals reviewed. Constitutional:       obese  HENT:  Head: Normocephalic and atraumatic.  Eyes:  Conjunctivae normal are normal. Pupils are equal, round, and reactive to light.  Neck: Normal range of motion. Neck supple.  Cardiovascular: Normal rate and regular rhythm.   Murmur heard. Pulmonary/Chest: Effort normal and breath sounds normal.       Decreased at bases  Abdominal: Soft. Bowel sounds are normal. He exhibits distension.  Musculoskeletal: He exhibits edema and tenderness.          Assessment & Plan:  CHF-- change to demadex 50 and moniter bmet and potassium Home monitoring of weight Continue spiriva monitor CBC, a1c for GERD and DM Change to prilosec for cost control

## 2012-03-17 NOTE — Addendum Note (Signed)
Addended by: Rita Ohara R on: 03/17/2012 04:43 PM   Modules accepted: Orders

## 2012-03-17 NOTE — Patient Instructions (Signed)
The patient is instructed to continue all medications as prescribed. Schedule followup with check out clerk upon leaving the clinic  

## 2012-03-18 LAB — BASIC METABOLIC PANEL
BUN: 21 mg/dL (ref 6–23)
CO2: 32 mEq/L (ref 19–32)
Chloride: 98 mEq/L (ref 96–112)
Creat: 1.05 mg/dL (ref 0.50–1.35)
Glucose, Bld: 101 mg/dL — ABNORMAL HIGH (ref 70–99)

## 2012-03-30 ENCOUNTER — Telehealth: Payer: Self-pay | Admitting: Internal Medicine

## 2012-03-30 NOTE — Telephone Encounter (Signed)
Caller: [name]/Patient; Patient Name: [name]; PCP: Darryll Capers (Adults only); Best Callback Phone Number: [phone] Calling regarding he wants to speak with Kendal Hymen Dr. Lovell Sheehan nurse, he wants to notify of dizzy spells that he has been having for a week, states he has been on Torsemide 50 mg 1/2 pill q am, states dizzy spells last just a few seconds, BP readings include 85/54, 70/49, 87/52, declined triage and wants to be called by Kendal Hymen.

## 2012-03-31 ENCOUNTER — Other Ambulatory Visit: Payer: Self-pay | Admitting: *Deleted

## 2012-03-31 MED ORDER — TORSEMIDE 20 MG PO TABS: 20.0000 mg | ORAL_TABLET | Freq: Every day | ORAL | Status: DC

## 2012-03-31 NOTE — Telephone Encounter (Signed)
Sent to pharmacy 

## 2012-03-31 NOTE — Telephone Encounter (Signed)
Daughter called. Pls call pt around 1pm today at his home. [phone].  If pt isn't there, daughter Frances Nickels) at [phone].

## 2012-03-31 NOTE — Telephone Encounter (Signed)
Per dr Lovell Sheehan-  If still on lasix stop, if not decrease torosemide to 20 per d -son answered and he is not taking lasix there fore will call in torosemide 20 qd

## 2012-04-05 ENCOUNTER — Telehealth: Payer: Self-pay | Admitting: Internal Medicine

## 2012-04-05 NOTE — Telephone Encounter (Signed)
Please let pt know it was mailed

## 2012-04-05 NOTE — Telephone Encounter (Signed)
Patient's daughter called stating that they would like to have an rx for a Lumbar support roll mailed to the patient. Please advise/assist.

## 2012-04-06 NOTE — Telephone Encounter (Signed)
Patient family informed

## 2012-04-25 ENCOUNTER — Other Ambulatory Visit: Payer: Self-pay | Admitting: Internal Medicine

## 2012-05-02 ENCOUNTER — Telehealth: Payer: Self-pay | Admitting: Internal Medicine

## 2012-05-02 NOTE — Telephone Encounter (Signed)
Pt would like to know if he can take Aleve or Advil (nsaids) and how often. Pls advise.

## 2012-05-02 NOTE — Telephone Encounter (Signed)
Pt informed - only tylenol due to xarelto

## 2012-05-05 ENCOUNTER — Other Ambulatory Visit: Payer: Self-pay | Admitting: Internal Medicine

## 2012-05-11 ENCOUNTER — Other Ambulatory Visit: Payer: Self-pay | Admitting: Internal Medicine

## 2012-05-11 MED ORDER — RIVAROXABAN 20 MG PO TABS: 20.0000 mg | ORAL_TABLET | Freq: Every day | ORAL | Status: DC

## 2012-05-11 NOTE — Telephone Encounter (Signed)
Pt notified Rx refill sent to pharmacy. 

## 2012-05-11 NOTE — Telephone Encounter (Signed)
Pt took last xarelto med 15 mg. Pt needs refill call into [facility] [phone]. This med was not prescribed by Dr Lovell Sheehan.

## 2012-05-19 ENCOUNTER — Telehealth: Payer: Self-pay | Admitting: Internal Medicine

## 2012-05-19 NOTE — Telephone Encounter (Signed)
Received call from [facility] concerning [name] and Back pain. Attempted to call back [phone] and reached voicemail. Message left to please return call to office for assistance.

## 2012-05-22 ENCOUNTER — Telehealth: Payer: Self-pay | Admitting: Internal Medicine

## 2012-05-22 NOTE — Telephone Encounter (Signed)
Talked with ptand answered questions

## 2012-05-22 NOTE — Telephone Encounter (Signed)
Pt would like you to call him concerning on going problem. Pt made appt for wed, 01/08. W/ [place], but wants to speak with you before. Thank you!!

## 2012-05-23 ENCOUNTER — Other Ambulatory Visit: Payer: Self-pay | Admitting: Internal Medicine

## 2012-05-24 ENCOUNTER — Encounter: Payer: Self-pay | Admitting: Family

## 2012-05-24 ENCOUNTER — Ambulatory Visit (INDEPENDENT_AMBULATORY_CARE_PROVIDER_SITE_OTHER): Payer: Medicare Other | Admitting: Family

## 2012-05-24 VITALS — BP 108/60 | HR 108 | Wt 216.0 lb

## 2012-05-24 DIAGNOSIS — M1990 Unspecified osteoarthritis, unspecified site: Secondary | ICD-10-CM

## 2012-05-24 DIAGNOSIS — R600 Localized edema: Secondary | ICD-10-CM

## 2012-05-24 DIAGNOSIS — M545 Low back pain, unspecified: Secondary | ICD-10-CM

## 2012-05-24 DIAGNOSIS — I509 Heart failure, unspecified: Secondary | ICD-10-CM

## 2012-05-24 DIAGNOSIS — R609 Edema, unspecified: Secondary | ICD-10-CM

## 2012-05-24 MED ORDER — DICLOFENAC SODIUM 1 % TD GEL: 2.0000 g | Freq: Four times a day (QID) | TRANSDERMAL | Status: DC

## 2012-05-24 NOTE — Progress Notes (Signed)
Subjective:    Patient ID: [name], male    DOB: [birth year], 79 y.o.   MRN: [id number]  HPI 79 year old white male, nonsmoker, patient of Dr. Lovell Sheehan is in today with persistent bilateral leg swelling. He has a history of congestive heart failure. Patient has been on Lasix 40 mg that he's had difficulty tolerating due to hypotension and dizziness. Dr. Lovell Sheehan switch the medication to torsemide 40 mg that works well however he continues to have dizziness. Therefore, his intake and 20 mg daily but is not controlling his swelling. He has shortness of breath with minimal exertion. He has an ejection fraction of 65-70%. At this point he has not seen a cardiologist.  Patient continues to complain of low back pain the left leg pain that has responded to gabapentin 100 mg 3 times a day. However, recently the medication is not working as effectively.  Concerns of pain in his right knee that is worsening. Patient is unable to take any anti-inflammatory medications.  Review of Systems  HENT: Negative.   Respiratory: Positive for shortness of breath. Negative for cough and wheezing.   Cardiovascular: Positive for leg swelling. Negative for chest pain and palpitations.  Gastrointestinal: Negative.   Musculoskeletal: Positive for back pain and arthralgias. Negative for myalgias and joint swelling.       Right knee pain. Back pain  Skin: Negative.   Neurological: Negative.   Hematological: Negative.   Psychiatric/Behavioral: Negative.    Past Medical History  Diagnosis Date  . Obesity   . Hypertension   . Edema   . BPH (benign prostatic hypertrophy)   . Asthma   . Bronchospasm, exercise-induced   . Chronic kidney disease   . Arthritis     History   Social History  . Marital Status: Married    Spouse Name: N/A    Number of Children: N/A  . Years of Education: N/A   Occupational History  . retired    Social History Main Topics  . Smoking status: Former Smoker    Quit date:  05/17/1964  . Smokeless tobacco: Not on file  . Alcohol Use: No  . Drug Use: No  . Sexually Active: Not Currently   Other Topics Concern  . Not on file   Social History Narrative  . No narrative on file    Past Surgical History  Procedure Date  . Vascular surgery removed varicose veins    Family History  Problem Relation Age of Onset  . Alzheimer's disease Mother   . Heart disease Father     Allergies  Allergen Reactions  . Vicodin (Hydrocodone-Acetaminophen) Other (See Comments)    Unknown reaction many years ago  . Morphine And Related Nausea And Vomiting    Current Outpatient Prescriptions on File Prior to Visit  Medication Sig Dispense Refill  . acetaminophen (TYLENOL) 500 MG tablet Take 1,000 mg by mouth 2 (two) times daily as needed. For pain      . albuterol (PROVENTIL HFA;VENTOLIN HFA) 108 (90 BASE) MCG/ACT inhaler Inhale 1 puff into the lungs every 6 (six) hours as needed. For bronchial spasms      . buprenorphine (BUTRANS) 10 MCG/HR PTWK Place 1 patch (10 mcg total) onto the skin once a week.  4 patch  1  . Calcium Carbonate-Vitamin D (CALCIUM 600+D) 600-400 MG-UNIT per tablet Take 1 tablet by mouth daily.      . carvedilol (COREG) 3.125 MG tablet Take 1 tablet (3.125 mg total) by mouth 2 (two) times  daily with a meal.  60 tablet  3  . gabapentin (NEURONTIN) 100 MG capsule TAKE ONE CAPSULE BY MOUTH THREE TIMES DAILY  90 capsule  0  . metoprolol (LOPRESSOR) 50 MG tablet Take 1 tablet (50 mg total) by mouth 2 (two) times daily.  60 tablet  2  . Multiple Vitamins-Minerals (OCUVITE PRESERVISION) TABS Take 2 tablets by mouth daily.        [name] omeprazole (PRILOSEC) 20 MG capsule Take 1 capsule (20 mg total) by mouth daily.  30 capsule  3  . Rivaroxaban (XARELTO) 20 MG TABS Take 1 tablet (20 mg total) by mouth daily. Start on 9/27  30 tablet  3  . rosuvastatin (CRESTOR) 20 MG tablet Take 20 mg by mouth once a week. On Fridays      . Tamsulosin HCl (FLOMAX) 0.4 MG CAPS  Take 0.4 mg by mouth daily after supper.      . Tamsulosin HCl (FLOMAX) 0.4 MG CAPS TAKE ONE CAPSULE BY MOUTH EVERY DAY  60 capsule  2  . tiotropium (SPIRIVA HANDIHALER) 18 MCG inhalation capsule Place 1 capsule (18 mcg total) into inhaler and inhale daily.  30 capsule  12  . torsemide (DEMADEX) 20 MG tablet Take 1 tablet (20 mg total) by mouth daily.  30 tablet  3  . zolpidem (AMBIEN) 10 MG tablet TAKE ONE TABLET BY MOUTH AT BEDTIME AS NEEDED FOR SLEEP  30 tablet  4    BP 108/60  Pulse 108  Wt 216 lb (97.977 kg)  SpO2 98%chart    Objective:   Physical Exam  Constitutional: He is oriented to person, place, and time. He appears well-developed and well-nourished.  HENT:  Right Ear: External ear normal.  Left Ear: External ear normal.  Nose: Nose normal.  Mouth/Throat: Oropharynx is clear and moist.  Neck: Normal range of motion. Neck supple.  Cardiovascular: Normal rate.   Murmur heard.      Systolic ejection murmur  Pulmonary/Chest: Effort normal and breath sounds normal.  Abdominal: Soft. Bowel sounds are normal.  Musculoskeletal: He exhibits no tenderness.       2+ pitting edema  Neurological: He is alert and oriented to person, place, and time.  Skin: Skin is warm.          Assessment & Plan:  Assessment: Osteoarthritis, peripheral edema, congestive heart failure, low back pain  Plan: Continue torsemide 20 mg once daily except Tuesday and Thursday 40 mg. Refer to cardiology. Voltaren gel to the right knee 4 times a day. Increase gabapentin 300 mg twice a day. May increase to 3 times a day over the next 1-2 weeks. Recheck with Dr. Lovell Sheehan in one month. Call the office with any questions or concerns. Recheck a schedule, and as needed.

## 2012-05-24 NOTE — Patient Instructions (Addendum)
1. Voltaren gel apply 4 times a day to the knee 2. Tuesday and Thursday, 40mg  of Torsemide. All other days, 20mg .  3. Cardiology referral pending Dr. Lovell Sheehan discussion 4. Increase Gabapentin to 300mg  two-three times a day.  Degenerative Arthritis You have osteoarthritis. This is the wear and tear arthritis that comes with aging. It is also called degenerative arthritis. This is common in people past middle age. It is caused by stress on the joints. The large weight bearing joints of the lower extremities are most often affected. The knees, hips, back, neck, and hands can become painful, swollen, and stiff. This is the most common type of arthritis. It comes on with age, carrying too much weight, or from an injury. Treatment includes resting the sore joint until the pain and swelling improve. Crutches or a walker may be needed for severe flares. Only take over-the-counter or prescription medicines for pain, discomfort, or fever as directed by your caregiver. Local heat therapy may improve motion. Cortisone shots into the joint are sometimes used to reduce pain and swelling during flares. Osteoarthritis is usually not crippling and progresses slowly. There are things you can do to decrease pain:  Avoid high impact activities.  Exercise regularly.  Low impact exercises such as walking, biking and swimming help to keep the muscles strong and keep normal joint function.  Stretching helps to keep your range of motion.  Lose weight if you are overweight. This reduces joint stress. In severe cases when you have pain at rest or increasing disability, joint surgery may be helpful. See your caregiver for follow-up treatment as recommended.  SEEK IMMEDIATE MEDICAL CARE IF:   You have severe joint pain.  Marked swelling and redness in your joint develops.  You develop a high fever. Document Released: 05/03/2005 Document Revised: 07/26/2011 Document Reviewed: 10/03/2006 [facility] Patient Information  2013 [place], [place].

## 2012-05-25 ENCOUNTER — Telehealth: Payer: Self-pay | Admitting: Internal Medicine

## 2012-05-25 MED ORDER — GABAPENTIN 300 MG PO CAPS: 300.0000 mg | ORAL_CAPSULE | Freq: Two times a day (BID) | ORAL | Status: DC

## 2012-05-25 NOTE — Telephone Encounter (Signed)
Rx sent to pharmacy   

## 2012-05-25 NOTE — Telephone Encounter (Signed)
Pt was seen yesterday and gabapentatin was increase to 300 mg ?twice a day call into [facility]. Pharm did not received new rx

## 2012-06-05 ENCOUNTER — Encounter: Payer: Self-pay | Admitting: Internal Medicine

## 2012-06-05 ENCOUNTER — Ambulatory Visit (INDEPENDENT_AMBULATORY_CARE_PROVIDER_SITE_OTHER): Payer: Medicare Other | Admitting: Internal Medicine

## 2012-06-05 VITALS — BP 110/66 | HR 72 | Temp 98.0°F | Resp 18 | Ht 69.0 in | Wt 222.0 lb

## 2012-06-05 DIAGNOSIS — I10 Essential (primary) hypertension: Secondary | ICD-10-CM

## 2012-06-05 DIAGNOSIS — J45909 Unspecified asthma, uncomplicated: Secondary | ICD-10-CM

## 2012-06-05 DIAGNOSIS — I509 Heart failure, unspecified: Secondary | ICD-10-CM

## 2012-06-05 DIAGNOSIS — G8929 Other chronic pain: Secondary | ICD-10-CM

## 2012-06-05 DIAGNOSIS — E785 Hyperlipidemia, unspecified: Secondary | ICD-10-CM

## 2012-06-05 MED ORDER — TRAMADOL HCL 50 MG PO TABS: 50.0000 mg | ORAL_TABLET | Freq: Three times a day (TID) | ORAL | Status: DC | PRN

## 2012-06-05 MED ORDER — TORSEMIDE 20 MG PO TABS: 40.0000 mg | ORAL_TABLET | Freq: Every day | ORAL | Status: DC

## 2012-06-05 MED ORDER — GABAPENTIN 300 MG PO CAPS: 600.0000 mg | ORAL_CAPSULE | Freq: Three times a day (TID) | ORAL | Status: DC

## 2012-06-05 NOTE — Progress Notes (Signed)
Subjective:    Patient ID: [name], male    DOB: [birth year], 79 y.o.   MRN: [id number]  HPI  Some exertional SOB Diuretic was changed to prevent hypotension monitoring of labs Titrations of gabapentin Spinal stenosis CHF with moderate symptoms of exertional SOB  Review of Systems  Constitutional: Positive for fatigue.  HENT: Positive for neck stiffness.   Eyes: Negative.   Respiratory: Positive for chest tightness and shortness of breath.   Cardiovascular: Positive for leg swelling. Negative for chest pain.  Gastrointestinal: Positive for abdominal distention. Negative for abdominal pain.  Genitourinary: Positive for frequency.  Neurological: Positive for tremors and weakness.  Psychiatric/Behavioral: Positive for dysphoric mood.   Past Medical History  Diagnosis Date  . Obesity   . Hypertension   . Edema   . BPH (benign prostatic hypertrophy)   . Asthma   . Bronchospasm, exercise-induced   . Chronic kidney disease   . Arthritis     History   Social History  . Marital Status: Married    Spouse Name: N/A    Number of Children: N/A  . Years of Education: N/A   Occupational History  . retired    Social History Main Topics  . Smoking status: Former Smoker    Quit date: 05/17/1964  . Smokeless tobacco: Not on file  . Alcohol Use: No  . Drug Use: No  . Sexually Active: Not Currently   Other Topics Concern  . Not on file   Social History Narrative  . No narrative on file    Past Surgical History  Procedure Date  . Vascular surgery removed varicose veins    Family History  Problem Relation Age of Onset  . Alzheimer's disease Mother   . Heart disease Father     Allergies  Allergen Reactions  . Vicodin (Hydrocodone-Acetaminophen) Other (See Comments)    Unknown reaction many years ago  . Morphine And Related Nausea And Vomiting    Current Outpatient Prescriptions on File Prior to Visit  Medication Sig Dispense Refill  . acetaminophen  (TYLENOL) 500 MG tablet Take 1,000 mg by mouth 2 (two) times daily as needed. For pain      . albuterol (PROVENTIL HFA;VENTOLIN HFA) 108 (90 BASE) MCG/ACT inhaler Inhale 1 puff into the lungs every 6 (six) hours as needed. For bronchial spasms      . Calcium Carbonate-Vitamin D (CALCIUM 600+D) 600-400 MG-UNIT per tablet Take 1 tablet by mouth daily.      . carvedilol (COREG) 3.125 MG tablet Take 1 tablet (3.125 mg total) by mouth 2 (two) times daily with a meal.  60 tablet  3  . diclofenac sodium (VOLTAREN) 1 % GEL Apply 2 g topically 4 (four) times daily.  1 Tube  3  . gabapentin (NEURONTIN) 300 MG capsule Take 2 capsules (600 mg total) by mouth 3 (three) times daily.  540 capsule  3  . Multiple Vitamins-Minerals (OCUVITE PRESERVISION) TABS Take 2 tablets by mouth daily.        [name] omeprazole (PRILOSEC) 20 MG capsule Take 1 capsule (20 mg total) by mouth daily.  30 capsule  3  . Rivaroxaban (XARELTO) 20 MG TABS Take 1 tablet (20 mg total) by mouth daily. Start on 9/27  30 tablet  3  . rosuvastatin (CRESTOR) 20 MG tablet Take 20 mg by mouth once a week. On Fridays      . Tamsulosin HCl (FLOMAX) 0.4 MG CAPS TAKE ONE CAPSULE BY MOUTH EVERY DAY  60  capsule  2  . tiotropium (SPIRIVA HANDIHALER) 18 MCG inhalation capsule Place 1 capsule (18 mcg total) into inhaler and inhale daily.  30 capsule  12  . zolpidem (AMBIEN) 10 MG tablet TAKE ONE TABLET BY MOUTH AT BEDTIME AS NEEDED FOR SLEEP  30 tablet  4    BP 110/66  Pulse 72  Temp 98 F (36.7 C)  Resp 18  Ht 5\' 9"  (1.753 m)  Wt 222 lb (100.699 kg)  BMI 32.78 kg/m2       Objective:   Physical Exam  Constitutional: He appears well-developed and well-nourished.  HENT:  Head: Normocephalic and atraumatic.  Eyes: Conjunctivae normal are normal. Pupils are equal, round, and reactive to light.  Neck: Normal range of motion. Neck supple.  Cardiovascular:  Murmur heard. Pulmonary/Chest: Effort normal and breath sounds normal. He has no wheezes. He  has no rales.  Abdominal: Soft. Bowel sounds are normal.  Musculoskeletal: He exhibits edema and tenderness.  Psychiatric: He has a normal mood and affect. His behavior is normal.          Assessment & Plan:  Elevate feet and movement is key Will increase the Demadex to 40 mg by mouth daily.  We'll continue hisanticoagulants for the atrial fibrillation and gave samples to aid in compliance and cost And he will work towards better pain control by titrating the Neurontin to 600 mg 3 times a day and add tramadol with his Tylenol to reduce the Tylenol usage

## 2012-06-05 NOTE — Patient Instructions (Addendum)
Start by taking Neurontin 300 mg 3 times a day for about 2 weeks then increase it to 600 at bedtime for about a week and moved to 600 at lunchtime for about a week and then move to 600    3 times a day

## 2012-06-08 ENCOUNTER — Telehealth: Payer: Self-pay | Admitting: Internal Medicine

## 2012-06-08 NOTE — Telephone Encounter (Signed)
Pt informed dr Lovell Sheehan sent med to pharmacy at 1 q 8 hrs prn with 1 extra strength tylenol

## 2012-06-08 NOTE — Telephone Encounter (Signed)
Pt requesting you call him back. States the pharm SIG on his Tramadol is one Q8hrs, and that is what he remembers. However, his daughter states she heard Dr. Hervey Ard say it was one Q12hrs, so that is all he is taking. States he is in pain, and daughter will not agree to one Q8hrs unless she hears it from you or Dr. Shela Commons. Please call pt back.

## 2012-06-12 ENCOUNTER — Other Ambulatory Visit: Payer: Self-pay | Admitting: Internal Medicine

## 2012-06-12 ENCOUNTER — Telehealth: Payer: Self-pay | Admitting: Internal Medicine

## 2012-06-12 DIAGNOSIS — G5790 Unspecified mononeuropathy of unspecified lower limb: Secondary | ICD-10-CM

## 2012-06-12 NOTE — Telephone Encounter (Signed)
Pt's daughter would like you to call her please concerning pt. She will be available after 1pm.

## 2012-06-12 NOTE — Telephone Encounter (Signed)
Per dr Lovell Sheehan- may have physical therapy to home- requesting walker,but best to wait and let pt evaluate. Will end order

## 2012-06-20 ENCOUNTER — Telehealth: Payer: Self-pay | Admitting: Internal Medicine

## 2012-06-20 NOTE — Telephone Encounter (Signed)
Left message for pt to call back  °

## 2012-06-20 NOTE — Telephone Encounter (Signed)
May have to call his daughter just to let them know he refused and they can be there next time

## 2012-06-20 NOTE — Telephone Encounter (Signed)
[facility] cam over the weekend and attempted to do the phys therapy. The therapist was early, so the patient refused. They can get back in the home by the first to mid part of next week if ok. If not, please call. Thanks.

## 2012-06-22 NOTE — Telephone Encounter (Signed)
talked with daughter and she said the patient did not hear pt knocking on door, so he went to another patients house and was suppose to return but was unable. They are trying to get in touch with [facility] to reschedule

## 2012-06-23 ENCOUNTER — Other Ambulatory Visit: Payer: Self-pay | Admitting: Family

## 2012-06-23 NOTE — Telephone Encounter (Signed)
[facility] called after evaluating pt for physical therapy.  States legs are swollen up to thighs and bp is 100/70 with a pulse of 88.. They are suggesting a nurse evaluation ,cardiab rehab, a rollater walker and a bedside commode.  Needs this faxed to [phone]

## 2012-07-03 ENCOUNTER — Telehealth: Payer: Self-pay | Admitting: Internal Medicine

## 2012-07-03 NOTE — Telephone Encounter (Signed)
Pt daughter has a couple of questions she would like to ask you. Pls call.

## 2012-07-04 ENCOUNTER — Other Ambulatory Visit: Payer: Self-pay | Admitting: Internal Medicine

## 2012-07-04 NOTE — Telephone Encounter (Signed)
Talked with daughter and also Left message on machine For home health that it was ok to start cardiac rehab, script for rolator walker, and bedside commode.

## 2012-07-05 ENCOUNTER — Encounter (HOSPITAL_COMMUNITY): Payer: Self-pay | Admitting: Neurology

## 2012-07-05 ENCOUNTER — Telehealth: Payer: Self-pay | Admitting: Internal Medicine

## 2012-07-05 ENCOUNTER — Emergency Department (HOSPITAL_COMMUNITY): Payer: Medicare Other

## 2012-07-05 ENCOUNTER — Inpatient Hospital Stay (HOSPITAL_COMMUNITY)
Admission: EM | Admit: 2012-07-05 | Discharge: 2012-07-14 | DRG: 308 | Disposition: A | Discharge status: Skilled Nursing Facility | Payer: Medicare Other | Attending: Internal Medicine | Admitting: Internal Medicine

## 2012-07-05 DIAGNOSIS — E1165 Type 2 diabetes mellitus with hyperglycemia: Secondary | ICD-10-CM | POA: Diagnosis present

## 2012-07-05 DIAGNOSIS — IMO0002 Reserved for concepts with insufficient information to code with codable children: Secondary | ICD-10-CM | POA: Diagnosis present

## 2012-07-05 DIAGNOSIS — J4489 Other specified chronic obstructive pulmonary disease: Secondary | ICD-10-CM | POA: Diagnosis present

## 2012-07-05 DIAGNOSIS — Z8639 Personal history of other endocrine, nutritional and metabolic disease: Secondary | ICD-10-CM | POA: Diagnosis present

## 2012-07-05 DIAGNOSIS — I5021 Acute systolic (congestive) heart failure: Secondary | ICD-10-CM

## 2012-07-05 DIAGNOSIS — N189 Chronic kidney disease, unspecified: Secondary | ICD-10-CM | POA: Diagnosis present

## 2012-07-05 DIAGNOSIS — R531 Weakness: Secondary | ICD-10-CM | POA: Diagnosis present

## 2012-07-05 DIAGNOSIS — R609 Edema, unspecified: Secondary | ICD-10-CM

## 2012-07-05 DIAGNOSIS — R82998 Other abnormal findings in urine: Secondary | ICD-10-CM | POA: Diagnosis not present

## 2012-07-05 DIAGNOSIS — Z888 Allergy status to other drugs, medicaments and biological substances status: Secondary | ICD-10-CM

## 2012-07-05 DIAGNOSIS — Z6832 Body mass index (BMI) 32.0-32.9, adult: Secondary | ICD-10-CM

## 2012-07-05 DIAGNOSIS — I4510 Unspecified right bundle-branch block: Secondary | ICD-10-CM

## 2012-07-05 DIAGNOSIS — J449 Chronic obstructive pulmonary disease, unspecified: Secondary | ICD-10-CM | POA: Diagnosis present

## 2012-07-05 DIAGNOSIS — L89109 Pressure ulcer of unspecified part of back, unspecified stage: Secondary | ICD-10-CM | POA: Diagnosis not present

## 2012-07-05 DIAGNOSIS — Z886 Allergy status to analgesic agent status: Secondary | ICD-10-CM

## 2012-07-05 DIAGNOSIS — K219 Gastro-esophageal reflux disease without esophagitis: Secondary | ICD-10-CM | POA: Diagnosis present

## 2012-07-05 DIAGNOSIS — Z8249 Family history of ischemic heart disease and other diseases of the circulatory system: Secondary | ICD-10-CM

## 2012-07-05 DIAGNOSIS — R5383 Other fatigue: Secondary | ICD-10-CM

## 2012-07-05 DIAGNOSIS — W19XXXA Unspecified fall, initial encounter: Secondary | ICD-10-CM

## 2012-07-05 DIAGNOSIS — I509 Heart failure, unspecified: Secondary | ICD-10-CM | POA: Diagnosis present

## 2012-07-05 DIAGNOSIS — I959 Hypotension, unspecified: Secondary | ICD-10-CM | POA: Diagnosis not present

## 2012-07-05 DIAGNOSIS — L8992 Pressure ulcer of unspecified site, stage 2: Secondary | ICD-10-CM | POA: Diagnosis not present

## 2012-07-05 DIAGNOSIS — I129 Hypertensive chronic kidney disease with stage 1 through stage 4 chronic kidney disease, or unspecified chronic kidney disease: Secondary | ICD-10-CM | POA: Diagnosis present

## 2012-07-05 DIAGNOSIS — E1169 Type 2 diabetes mellitus with other specified complication: Secondary | ICD-10-CM

## 2012-07-05 DIAGNOSIS — I10 Essential (primary) hypertension: Secondary | ICD-10-CM | POA: Diagnosis present

## 2012-07-05 DIAGNOSIS — Z79899 Other long term (current) drug therapy: Secondary | ICD-10-CM

## 2012-07-05 DIAGNOSIS — R600 Localized edema: Secondary | ICD-10-CM

## 2012-07-05 DIAGNOSIS — E785 Hyperlipidemia, unspecified: Secondary | ICD-10-CM | POA: Diagnosis present

## 2012-07-05 DIAGNOSIS — Z87891 Personal history of nicotine dependence: Secondary | ICD-10-CM

## 2012-07-05 DIAGNOSIS — I4891 Unspecified atrial fibrillation: Principal | ICD-10-CM | POA: Diagnosis present

## 2012-07-05 DIAGNOSIS — E669 Obesity, unspecified: Secondary | ICD-10-CM | POA: Diagnosis present

## 2012-07-05 DIAGNOSIS — R5381 Other malaise: Secondary | ICD-10-CM

## 2012-07-05 HISTORY — DX: Heart failure, unspecified: I50.9

## 2012-07-05 HISTORY — DX: Gastro-esophageal reflux disease without esophagitis: K21.9

## 2012-07-05 HISTORY — DX: Shortness of breath: R06.02

## 2012-07-05 HISTORY — DX: Weakness: R53.1

## 2012-07-05 HISTORY — DX: Unspecified atrial fibrillation: I48.91

## 2012-07-05 HISTORY — DX: Acute systolic (congestive) heart failure: I50.21

## 2012-07-05 LAB — CBC WITH DIFFERENTIAL/PLATELET
Basophils Absolute: 0 10*3/uL (ref 0.0–0.1)
Basophils Relative: 0 % (ref 0–1)
HCT: 31.8 % — ABNORMAL LOW (ref 39.0–52.0)
Hemoglobin: 10.3 g/dL — ABNORMAL LOW (ref 13.0–17.0)
Lymphocytes Relative: 26 % (ref 12–46)
MCHC: 32.4 g/dL (ref 30.0–36.0)
Monocytes Relative: 18 % — ABNORMAL HIGH (ref 3–12)
Neutro Abs: 3.7 10*3/uL (ref 1.7–7.7)
Neutrophils Relative %: 55 % (ref 43–77)
RDW: 16.9 % — ABNORMAL HIGH (ref 11.5–15.5)
WBC: 6.8 10*3/uL (ref 4.0–10.5)

## 2012-07-05 LAB — COMPREHENSIVE METABOLIC PANEL
AST: 16 U/L (ref 0–37)
Albumin: 3.6 g/dL (ref 3.5–5.2)
Alkaline Phosphatase: 77 U/L (ref 39–117)
CO2: 36 mEq/L — ABNORMAL HIGH (ref 19–32)
Chloride: 95 mEq/L — ABNORMAL LOW (ref 96–112)
GFR calc non Af Amer: 49 mL/min — ABNORMAL LOW (ref >90)
Potassium: 3.8 mEq/L (ref 3.5–5.1)
Total Bilirubin: 0.4 mg/dL (ref 0.3–1.2)

## 2012-07-05 LAB — TSH: TSH: 2.341 u[IU]/mL (ref 0.350–4.500)

## 2012-07-05 LAB — URINALYSIS, ROUTINE W REFLEX MICROSCOPIC
Nitrite: NEGATIVE
Urobilinogen, UA: 0.2 mg/dL (ref 0.0–1.0)

## 2012-07-05 LAB — PRO B NATRIURETIC PEPTIDE: Pro B Natriuretic peptide (BNP): 2974 pg/mL — ABNORMAL HIGH (ref 0–450)

## 2012-07-05 LAB — URINE MICROSCOPIC-ADD ON

## 2012-07-05 LAB — POCT I-STAT TROPONIN I

## 2012-07-05 LAB — GLUCOSE, CAPILLARY

## 2012-07-05 MED ORDER — PANTOPRAZOLE SODIUM 40 MG PO TBEC
40.0000 mg | DELAYED_RELEASE_TABLET | Freq: Every day | ORAL | Status: DC
  Administered 2012-07-06 – 2012-07-14 (×9): 40 mg via ORAL
  Filled 2012-07-05 (×10): qty 1

## 2012-07-05 MED ORDER — ZOLPIDEM TARTRATE 5 MG PO TABS
5.0000 mg | ORAL_TABLET | Freq: Every evening | ORAL | Status: DC | PRN
  Administered 2012-07-06 – 2012-07-13 (×6): 5 mg via ORAL
  Filled 2012-07-05 (×6): qty 1

## 2012-07-05 MED ORDER — TIOTROPIUM BROMIDE MONOHYDRATE 18 MCG IN CAPS
18.0000 ug | ORAL_CAPSULE | Freq: Every day | RESPIRATORY_TRACT | Status: DC
  Administered 2012-07-06 – 2012-07-14 (×9): 18 ug via RESPIRATORY_TRACT
  Filled 2012-07-05 (×2): qty 5

## 2012-07-05 MED ORDER — GABAPENTIN 300 MG PO CAPS
600.0000 mg | ORAL_CAPSULE | Freq: Three times a day (TID) | ORAL | Status: DC
  Administered 2012-07-05 – 2012-07-14 (×26): 600 mg via ORAL
  Filled 2012-07-05 (×28): qty 2

## 2012-07-05 MED ORDER — CARVEDILOL 3.125 MG PO TABS
3.1250 mg | ORAL_TABLET | Freq: Two times a day (BID) | ORAL | Status: DC
  Administered 2012-07-06 – 2012-07-07 (×3): 3.125 mg via ORAL
  Filled 2012-07-05 (×6): qty 1

## 2012-07-05 MED ORDER — SENNA 8.6 MG PO TABS
2.0000 | ORAL_TABLET | Freq: Every day | ORAL | Status: DC | PRN
  Administered 2012-07-05: 17.2 mg via ORAL
  Filled 2012-07-05: qty 2

## 2012-07-05 MED ORDER — ONDANSETRON HCL 4 MG PO TABS: 4.0000 mg | ORAL_TABLET | Freq: Four times a day (QID) | ORAL | Status: DC | PRN

## 2012-07-05 MED ORDER — ATORVASTATIN CALCIUM 20 MG PO TABS
20.0000 mg | ORAL_TABLET | Freq: Every day | ORAL | Status: DC
  Filled 2012-07-05: qty 1

## 2012-07-05 MED ORDER — ACETAMINOPHEN 500 MG PO TABS
500.0000 mg | ORAL_TABLET | Freq: Three times a day (TID) | ORAL | Status: DC | PRN
  Administered 2012-07-05 – 2012-07-12 (×4): 500 mg via ORAL
  Filled 2012-07-05 (×4): qty 1

## 2012-07-05 MED ORDER — TRAMADOL HCL 50 MG PO TABS
50.0000 mg | ORAL_TABLET | Freq: Three times a day (TID) | ORAL | Status: DC | PRN
  Administered 2012-07-05 – 2012-07-13 (×11): 50 mg via ORAL
  Filled 2012-07-05 (×12): qty 1

## 2012-07-05 MED ORDER — RIVAROXABAN 20 MG PO TABS
20.0000 mg | ORAL_TABLET | Freq: Every day | ORAL | Status: DC
  Administered 2012-07-05 – 2012-07-14 (×10): 20 mg via ORAL
  Filled 2012-07-05 (×10): qty 1

## 2012-07-05 MED ORDER — FUROSEMIDE 10 MG/ML IJ SOLN
40.0000 mg | Freq: Two times a day (BID) | INTRAMUSCULAR | Status: DC
  Administered 2012-07-05 – 2012-07-06 (×2): 40 mg via INTRAVENOUS
  Filled 2012-07-05 (×3): qty 4

## 2012-07-05 MED ORDER — POLYETHYLENE GLYCOL 3350 17 G PO PACK
17.0000 g | PACK | Freq: Every day | ORAL | Status: DC
  Administered 2012-07-06 – 2012-07-14 (×8): 17 g via ORAL
  Filled 2012-07-05 (×9): qty 1

## 2012-07-05 MED ORDER — SODIUM CHLORIDE 0.9 % IJ SOLN
3.0000 mL | Freq: Two times a day (BID) | INTRAMUSCULAR | Status: DC
  Administered 2012-07-05 – 2012-07-14 (×17): 3 mL via INTRAVENOUS

## 2012-07-05 MED ORDER — FUROSEMIDE 10 MG/ML IJ SOLN
40.0000 mg | Freq: Once | INTRAMUSCULAR | Status: AC
  Administered 2012-07-05: 40 mg via INTRAVENOUS
  Filled 2012-07-05: qty 4

## 2012-07-05 MED ORDER — SODIUM CHLORIDE 0.9 % IV SOLN
Freq: Once | INTRAVENOUS | Status: AC
  Administered 2012-07-05: 12:00:00 via INTRAVENOUS

## 2012-07-05 MED ORDER — INSULIN ASPART 100 UNIT/ML ~~LOC~~ SOLN
0.0000 [IU] | Freq: Three times a day (TID) | SUBCUTANEOUS | Status: DC
  Administered 2012-07-08 (×2): 2 [IU] via SUBCUTANEOUS
  Administered 2012-07-09 – 2012-07-13 (×5): 1 [IU] via SUBCUTANEOUS

## 2012-07-05 MED ORDER — TAMSULOSIN HCL 0.4 MG PO CAPS
0.4000 mg | ORAL_CAPSULE | Freq: Every day | ORAL | Status: DC
  Administered 2012-07-05 – 2012-07-14 (×10): 0.4 mg via ORAL
  Filled 2012-07-05 (×13): qty 1

## 2012-07-05 MED ORDER — ONDANSETRON HCL 4 MG/2ML IJ SOLN: 4.0000 mg | Freq: Four times a day (QID) | INTRAMUSCULAR | Status: DC | PRN

## 2012-07-05 NOTE — Telephone Encounter (Signed)
ED notification 

## 2012-07-05 NOTE — ED Notes (Signed)
Per EMS- Pt lives at home with his son. Increased weakness over past few days, recent falls, EMS called out this morning for fall. Assisted patient off the floor but did not want transport. This morning very weak, difficulty walking. Denies injury but states left knee pain. Also, legs are more swollen than normal. BP 180/100. Pt is alert and oriented.

## 2012-07-05 NOTE — Progress Notes (Signed)
Pts manual BP 90/50. Physician notified. Order parameters given for coreg dose. MD says ok to give Lasix. Ernesteen Mihalic, Melida Quitter, RN

## 2012-07-05 NOTE — Telephone Encounter (Signed)
Patient Information:  Caller Name: Everette  Phone: [phone]  Patient: [name], [name]  Gender: Male  DOB: December 14, 1926  Age: 79 Years  PCP: Darryll Capers (Adults only)  Office Follow Up:  Does the office need to follow up with this patient?: No  Instructions For The Office: N/A  RN Note:  Pt complains of Left Knee pain after Fall, denies Head injury. Son is with Pt at time of call. Pt denies fainting or passing out on 2-18 but is unable to stand due to weakness.  Advised Pt to call 911.  Son/Pt verbalized understanding.  Symptoms  Reason For Call & Symptoms: ER CALL. Fall, Pt unable to stand due to weakness  Reviewed Health History In EMR: N/A  Reviewed Medications In EMR: N/A  Reviewed Allergies In EMR: N/A  Reviewed Surgeries / Procedures: N/A  Date of Onset of Symptoms: 07/04/2012  Guideline(s) Used:  Weakness (Generalized) and Fatigue  Disposition Per Guideline:   Call EMS 911 Now  Reason For Disposition Reached:   Severe weakness (i.e., unable to walk or barely able to walk, requires support) and new onset or worsening  Advice Given:  N/A

## 2012-07-05 NOTE — Telephone Encounter (Signed)
admitted

## 2012-07-05 NOTE — H&P (Signed)
[facility] History and Physical  Vincient Vanaman Affeldt WJX:[id number] DOB: March 29, 1927 DOA: 07/05/2012  Referring physician: Wynetta Emery, [name] PCP: Carrie Mew, MD   Chief Complaint: Generalized weakness  HPI: [name] is a 79 y.o. male with past medical history of hypertension, lower extremity edema and chronic kidney disease. Patient came to the hospital after he collapsed to the floor today secondary to a generalized weakness. Patient said for the past 2 weeks she's been getting weaker, having more shortness of breath with exertion, and most of all having worsening of his lower extremity edema. Patient has some cough but no significant sputum production, denies chest pain, palpitations or orthopnea. Upon initial evaluation in the emergency department chest x-ray showed mild CHF and his lower extremity showed massive edema. His BUN is 42 and creatinine is 1.3. Has BNP of 2900, colostomy was in the hospital with was about 475.  Review of Systems:  Constitutional: negative for anorexia, fevers and sweats Eyes: negative for irritation, redness and visual disturbance Ears, nose, mouth, throat, and face: negative for earaches, epistaxis, nasal congestion and sore throat Respiratory: negative for cough, dyspnea on exertion, sputum and wheezing Cardiovascular: negative for chest pain, dyspnea, lower extremity edema, orthopnea, palpitations and syncope Gastrointestinal: negative for abdominal pain, constipation, diarrhea, melena, nausea and vomiting Genitourinary:negative for dysuria, frequency and hematuria Hematologic/lymphatic: negative for bleeding, easy bruising and lymphadenopathy Musculoskeletal:negative for arthralgias, muscle weakness and stiff joints Neurological: negative for coordination problems, gait problems, headaches and weakness Endocrine: negative for diabetic symptoms including polydipsia, polyuria and weight loss Allergic/Immunologic: negative for  anaphylaxis, hay fever and urticaria   Past Medical History  Diagnosis Date  . Obesity   . Hypertension   . Edema   . BPH (benign prostatic hypertrophy)   . Asthma   . Bronchospasm, exercise-induced   . Chronic kidney disease   . Arthritis   . CHF (congestive heart failure)   . GERD (gastroesophageal reflux disease)   . A-fib    Past Surgical History  Procedure Laterality Date  . Vascular surgery  removed varicose veins   Social History:  reports that he quit smoking about 48 years ago. He does not have any smokeless tobacco history on file. He reports that he does not drink alcohol or use illicit drugs.   Allergies  Allergen Reactions  . Nsaids   . Other     Opiates cause tightness in chest  . Vicodin (Hydrocodone-Acetaminophen) Other (See Comments)    Unknown reaction many years ago  . Morphine And Related Nausea And Vomiting    Family History  Problem Relation Age of Onset  . Alzheimer's disease Mother   . Heart disease Father     Prior to Admission medications   Medication Sig Start Date End Date Taking? Authorizing Provider  acetaminophen (TYLENOL) 500 MG tablet Take 1,000 mg by mouth 2 (two) times daily as needed. For pain   Yes Historical Provider, MD  albuterol (PROVENTIL HFA;VENTOLIN HFA) 108 (90 BASE) MCG/ACT inhaler Inhale 1 puff into the lungs every 6 (six) hours as needed. For bronchial spasms 09/03/10  Yes Stacie Glaze, MD  Calcium Carbonate-Vitamin D (CALCIUM 600+D) 600-400 MG-UNIT per tablet Take 1 tablet by mouth 3 (three) times a week.    Yes Historical Provider, MD  carvedilol (COREG) 3.125 MG tablet TAKE ONE TABLET BY MOUTH TWICE DAILY WITH A MEAL 06/23/12  Yes Stacie Glaze, MD  diclofenac sodium (VOLTAREN) 1 % GEL Apply 2 g topically 4 (four) times daily.  05/24/12  Yes Baker Pierini, FNP  gabapentin (NEURONTIN) 300 MG capsule Take 2 capsules (600 mg total) by mouth 3 (three) times daily. 06/05/12  Yes Stacie Glaze, MD  Multiple  Vitamins-Minerals (OCUVITE PRESERVISION) TABS Take 2 tablets by mouth 2 (two) times daily.    Yes Historical Provider, MD  omeprazole (PRILOSEC) 20 MG capsule Take 1 capsule (20 mg total) by mouth daily. 03/17/12  Yes Stacie Glaze, MD  polyethylene glycol [facility] Ethelene Hal) packet Take 17 g by mouth daily.   Yes Historical Provider, MD  Rivaroxaban (XARELTO) 20 MG TABS Take 1 tablet (20 mg total) by mouth daily. Start on 9/27 05/11/12  Yes [name], MD  rosuvastatin (CRESTOR) 20 MG tablet Take 20 mg by mouth once a week. On Fridays   Yes Historical Provider, MD  senna (SENOKOT) 8.6 MG TABS Take 1 tablet by mouth daily as needed. For constipation   Yes Historical Provider, MD  Tamsulosin HCl (FLOMAX) 0.4 MG CAPS TAKE ONE CAPSULE BY MOUTH EVERY DAY 05/05/12  Yes Stacie Glaze, MD  tiotropium (SPIRIVA HANDIHALER) 18 MCG inhalation capsule Place 1 capsule (18 mcg total) into inhaler and inhale daily. 02/22/12 02/21/13 Yes Stacie Glaze, MD  torsemide (DEMADEX) 20 MG tablet Take 2 tablets (40 mg total) by mouth daily. Two tablets at one time 06/05/12  Yes Stacie Glaze, MD  traMADol (ULTRAM) 50 MG tablet TAKE ONE TABLET BY MOUTH EVERY 8 HOURS AS NEEDED FOR PAIN (WITH  ONE  EXTRA  STRENGTH  TYLENOL) 07/04/12  Yes Stacie Glaze, MD  zolpidem (AMBIEN) 10 MG tablet TAKE ONE TABLET BY MOUTH AT BEDTIME AS NEEDED FOR SLEEP 02/21/12  Yes Stacie Glaze, MD   Physical Exam: Filed Vitals:   07/05/12 0955 07/05/12 1205  BP: 104/71 108/60  Pulse: 103 106  Temp: 98 F (36.7 C)   TempSrc: Oral   Resp: 18 16  SpO2: 97% 94%   General appearance: alert, cooperative and no distress  Head: Normocephalic, without obvious abnormality, atraumatic  Eyes: conjunctivae/corneas clear. PERRL, EOM's intact. Fundi benign.  Nose: Nares normal. Septum midline. Mucosa normal. No drainage or sinus tenderness.  Throat: lips, mucosa, and tongue normal; teeth and gums normal  Neck: Supple, no masses, no cervical  lymphadenopathy, no JVD appreciated, no meningeal signs Resp: clear to auscultation bilaterally  Chest wall: no tenderness  Cardio: regular rate and rhythm, S1, S2 normal, no murmur, click, rub or gallop  GI: soft, non-tender; bowel sounds normal; no masses, no organomegaly  Extremities: extremities normal, atraumatic, no cyanosis or edema  Skin: Skin color, texture, turgor normal. No rashes or lesions  Neurologic: Alert and oriented X 3, normal strength and tone. Normal symmetric reflexes. Normal coordination and gait   Labs on Admission:  Basic Metabolic Panel:  Recent Labs Lab 07/05/12 1036  NA 139  K 3.8  CL 95*  CO2 36*  GLUCOSE 114*  BUN 42*  CREATININE 1.30  CALCIUM 9.5   Liver Function Tests:  Recent Labs Lab 07/05/12 1036  AST 16  ALT 10  ALKPHOS 77  BILITOT 0.4  PROT 6.7  ALBUMIN 3.6   No results found for this basename: LIPASE, AMYLASE,  in the last 168 hours No results found for this basename: AMMONIA,  in the last 168 hours CBC:  Recent Labs Lab 07/05/12 1036  WBC 6.8  NEUTROABS 3.7  HGB 10.3*  HCT 31.8*  MCV 90.3  PLT 229   Cardiac Enzymes:  No results found for this basename: CKTOTAL, CKMB, CKMBINDEX, TROPONINI,  in the last 168 hours  BNP (last 3 results)  Recent Labs  01/23/12 1100 02/28/12 0941 07/05/12 1102  PROBNP 1194.0* 475.0* 2974.0*   CBG: No results found for this basename: GLUCAP,  in the last 168 hours  Radiological Exams on Admission: Dg Chest 2 View  07/05/2012  *RADIOLOGY REPORT*  Clinical Data: Weakness.  CHEST - 2 VIEW  Comparison: 01/18/2012.  Findings: Cardiomegaly.  Pulmonary vascular congestion.  Tortuous thoracic aorta.  No pleural effusion.  Basilar atelectasis.  Lower lung volumes than prior. No alveolar edema.  IMPRESSION: Mild CHF.   Original Report Authenticated By: Andreas Newport, M.D.     EKG: Independently reviewed.   Assessment/Plan Principal Problem:   Acute CHF Active Problems:    HYPERLIPIDEMIA   Type II or unspecified type diabetes mellitus with other specified manifestations, uncontrolled   HYPERTENSION   Lower extremity edema   General weakness   Acute CHF -Acute diastolic CHF versus right-sided heart failure. -Patient has massive peripheral edema and the lung findings her out of proportion to the lower extremity edema. -Patient has had 2-D echocardiogram done in September of 2013 showed normal ejection fraction. -Repeat 2-D echocardiogram, start low dose of Lasix. -Follow renal function closely as patient has BUN in the high side.  Generalized weakness -Secondary to the acute CHF, patient said he had progressive weakness since about 2 weeks ago.  Diabetes mellitus type 2 -Well controlled, hemoglobin A1c was 6.1 on 03/17/2012. We will repeat hemoglobin A1c. -No oral hypoglycemic agents at home, seems to be controlled with diet.  Hypertension -Continue preadmission oral medications. -These medications including Coreg and torsemide.  Atrial fibrillation -Rate controlled with Coreg, anticoagulation with Noel Christmas.  Code Status: Full code Family Communication: Discussed with the patient and his son daughter are at bedside. Disposition Plan: Telemetry, inpatient, and anticipate length of stay to be greater than 2 midnights.  Time spent: 70 minutes  Londen Lorge A [facility] Pager [phone]  If 7PM-7AM, please contact night-coverage [email or web address] Password [facility] 07/05/2012, 1:36 PM

## 2012-07-05 NOTE — ED Provider Notes (Signed)
History     CSN: [id number]  Arrival date & time 07/05/12  0945   First MD Initiated Contact with Patient 07/05/12 [phone]      Chief Complaint  Patient presents with  . Weakness    (Consider location/radiation/quality/duration/timing/severity/associated sxs/prior treatment) HPI  Nassim Cosma Ostlund is a 79 y.o. male with PMH significant CHF,hypertension, COPD, asthma and CKD with paroxysmal A. Fib complaining of generalized weakness and fluid retention. Patient states that the weakness has been worsening over the course of 1.5 years. The fluid retention has been worsening over the course of several weeks. Patient got out of bed approximately 2 AM and fell. He states that he fell because he felt weak he was conscious throughout there was no head trauma, nausea vomiting, headache. Patient's son attempted to help him up but he was unable to assist him up secondary to weakness. This is unusual for his prior falls. He is fallen approximately once per month for the past 4-5 months. EMS then heart problems called. Patient had be carried to bed. Patient reports a diffuse generalized and profound weakness. He also reports his severe dyspnea on exertion. He is able to sleep lying flat and denies any orthopnea or paroxysmal nocturnal dyspnea. Patient does have a prior diagnosis of CHF. He denies headache, pharyngitis, sinusitis, cough, fever, chills, myalgia, urinary frequency dysuria recent injuries weight loss, change in bowel or bladder habits.  Past Medical History  Diagnosis Date  . Obesity   . Hypertension   . Edema   . BPH (benign prostatic hypertrophy)   . Asthma   . Bronchospasm, exercise-induced   . Chronic kidney disease   . Arthritis   . CHF (congestive heart failure)   . GERD (gastroesophageal reflux disease)   . A-fib     Past Surgical History  Procedure Laterality Date  . Vascular surgery  removed varicose veins    Family History  Problem Relation Age of Onset  . Alzheimer's  disease Mother   . Heart disease Father     History  Substance Use Topics  . Smoking status: Former Smoker    Quit date: 05/17/1964  . Smokeless tobacco: Not on file  . Alcohol Use: No      Review of Systems  Constitutional: Negative for fever.  Respiratory: Positive for shortness of breath.   Cardiovascular: Negative for chest pain.  Gastrointestinal: Negative for nausea, vomiting, abdominal pain and diarrhea.  Neurological: Positive for weakness.  All other systems reviewed and are negative.    Allergies  Nsaids; Vicodin; and Morphine and related  Home Medications   Current Outpatient Rx  Name  Route  Sig  Dispense  Refill  . acetaminophen (TYLENOL) 500 MG tablet   Oral   Take 1,000 mg by mouth 2 (two) times daily as needed. For pain         . albuterol (PROVENTIL HFA;VENTOLIN HFA) 108 (90 BASE) MCG/ACT inhaler   Inhalation   Inhale 1 puff into the lungs every 6 (six) hours as needed. For bronchial spasms         . Calcium Carbonate-Vitamin D (CALCIUM 600+D) 600-400 MG-UNIT per tablet   Oral   Take 1 tablet by mouth daily.         . carvedilol (COREG) 3.125 MG tablet      TAKE ONE TABLET BY MOUTH TWICE DAILY WITH A MEAL   60 tablet   2   . diclofenac sodium (VOLTAREN) 1 % GEL   Topical   Apply  2 g topically 4 (four) times daily.   1 Tube   3   . gabapentin (NEURONTIN) 300 MG capsule   Oral   Take 2 capsules (600 mg total) by mouth 3 (three) times daily.   540 capsule   3   . Multiple Vitamins-Minerals (OCUVITE PRESERVISION) TABS   Oral   Take 2 tablets by mouth daily.           [name] omeprazole (PRILOSEC) 20 MG capsule   Oral   Take 1 capsule (20 mg total) by mouth daily.   30 capsule   3   . Rivaroxaban (XARELTO) 20 MG TABS   Oral   Take 1 tablet (20 mg total) by mouth daily. Start on 9/27   30 tablet   3   . rosuvastatin (CRESTOR) 20 MG tablet   Oral   Take 20 mg by mouth once a week. On Fridays         . Tamsulosin HCl  (FLOMAX) 0.4 MG CAPS      TAKE ONE CAPSULE BY MOUTH EVERY DAY   60 capsule   2   . tiotropium (SPIRIVA HANDIHALER) 18 MCG inhalation capsule   Inhalation   Place 1 capsule (18 mcg total) into inhaler and inhale daily.   30 capsule   12   . torsemide (DEMADEX) 20 MG tablet   Oral   Take 2 tablets (40 mg total) by mouth daily. Two tablets at one time   60 tablet   11   . traMADol (ULTRAM) 50 MG tablet      TAKE ONE TABLET BY MOUTH EVERY 8 HOURS AS NEEDED FOR PAIN (WITH  ONE  EXTRA  STRENGTH  TYLENOL)   90 tablet   0   . zolpidem (AMBIEN) 10 MG tablet      TAKE ONE TABLET BY MOUTH AT BEDTIME AS NEEDED FOR SLEEP   30 tablet   4     BP 104/71  Pulse 103  Temp(Src) 98 F (36.7 C) (Oral)  Resp 18  SpO2 97%  Physical Exam  Constitutional: He is oriented to person, place, and time. He appears well-developed and well-nourished.  HENT:  Head: Normocephalic and atraumatic.  Mouth/Throat: Oropharynx is clear and moist.  Extremely dry mucous membranes  Eyes: Conjunctivae are normal. Pupils are equal, round, and reactive to light.  Neck: Normal range of motion. No JVD present.  Cardiovascular: Normal rate, regular rhythm, normal heart sounds and intact distal pulses.  Exam reveals no gallop and no friction rub.   No murmur heard. Pulmonary/Chest: Effort normal and breath sounds normal. No respiratory distress. He has no wheezes. He has no rales. He exhibits no tenderness.  Abdominal: Soft. Bowel sounds are normal. He exhibits no distension and no mass. There is no tenderness. There is no rebound and no guarding.  Musculoskeletal: He exhibits edema.  3+ pitting edema to mid thigh  Neurological: He is alert and oriented to person, place, and time.  Skin: Skin is warm.  Psychiatric: He has a normal mood and affect.    ED Course  Procedures (including critical care time)  Labs Reviewed - No data to display Dg Chest 2 View  07/05/2012  *RADIOLOGY REPORT*  Clinical Data:  Weakness.  CHEST - 2 VIEW  Comparison: 01/18/2012.  Findings: Cardiomegaly.  Pulmonary vascular congestion.  Tortuous thoracic aorta.  No pleural effusion.  Basilar atelectasis.  Lower lung volumes than prior. No alveolar edema.  IMPRESSION: Mild CHF.   Original Report  Authenticated By: Andreas Newport, M.D.     Date: 07/05/2012  Rate: 100  Rhythm: atrial fibrillation  QRS Axis: right  Intervals: normal  ST/T Wave abnormalities: nonspecific ST/T changes  Conduction Disutrbances:right bundle branch block and left anterior fascicular block  Narrative Interpretation:   Old EKG Reviewed: unchanged   1. CHF (congestive heart failure)   2. Peripheral edema   3. Fall   4. RBBB   5. A-fib      MDM  Patient with CHF and COPD complaining of long term worsening weakness and nearly worsening peripheral edema and shortness of breath.  Chest x-ray is read as mild CHF and patient's BNP is 3000 this is above his baseline. Patient also is very dehydrated with a BUN of 42 he is retaining CO2 at 36 this is not unusual for his baseline with COPD. He is not anemic.  Patient is intravascularly depleted and very dry however don't want to overload him with fluid. I will write him for maintenance fluid and also 40 of Lasix IV.  He will require admission for CHF exacerbation  I-STAT is negative. Patient will be admitted to [facility]ist Dr. Arthor Captain.            Wynetta Emery, [name] 07/05/12 1424

## 2012-07-05 NOTE — ED Provider Notes (Signed)
Medical screening examination/treatment/procedure(s) were conducted as a shared visit with non-physician practitioner(s) and myself.  I personally evaluated the patient during the encounter  Derwood Kaplan, MD 07/05/12 1746

## 2012-07-05 NOTE — ED Notes (Addendum)
Attempt to call report x 1  

## 2012-07-06 ENCOUNTER — Encounter (HOSPITAL_COMMUNITY): Payer: Self-pay | Admitting: General Practice

## 2012-07-06 LAB — URINE MICROSCOPIC-ADD ON

## 2012-07-06 LAB — URINE CULTURE

## 2012-07-06 LAB — BASIC METABOLIC PANEL
Chloride: 96 mEq/L (ref 96–112)
GFR calc Af Amer: 70 mL/min — ABNORMAL LOW (ref >90)
Potassium: 3.3 mEq/L — ABNORMAL LOW (ref 3.5–5.1)
Sodium: 141 mEq/L (ref 135–145)

## 2012-07-06 LAB — CBC
HCT: 28.9 % — ABNORMAL LOW (ref 39.0–52.0)
Hemoglobin: 9.3 g/dL — ABNORMAL LOW (ref 13.0–17.0)
RBC: 3.22 MIL/uL — ABNORMAL LOW (ref 4.22–5.81)
WBC: 4.9 10*3/uL (ref 4.0–10.5)

## 2012-07-06 LAB — GLUCOSE, CAPILLARY: Glucose-Capillary: 149 mg/dL — ABNORMAL HIGH (ref 70–99)

## 2012-07-06 LAB — URINALYSIS, ROUTINE W REFLEX MICROSCOPIC
Glucose, UA: NEGATIVE mg/dL
Hgb urine dipstick: NEGATIVE
Specific Gravity, Urine: 1.013 (ref 1.005–1.030)

## 2012-07-06 MED ORDER — DEXTROSE 5 % IV SOLN
5.0000 mg/h | INTRAVENOUS | Status: DC
  Administered 2012-07-06: 5 mg/h via INTRAVENOUS
  Filled 2012-07-06: qty 100

## 2012-07-06 MED ORDER — METOPROLOL TARTRATE 1 MG/ML IV SOLN
5.0000 mg | Freq: Three times a day (TID) | INTRAVENOUS | Status: DC | PRN
  Administered 2012-07-06 (×3): 5 mg via INTRAVENOUS
  Filled 2012-07-06 (×3): qty 5

## 2012-07-06 MED ORDER — POTASSIUM CHLORIDE CRYS ER 20 MEQ PO TBCR
40.0000 meq | EXTENDED_RELEASE_TABLET | Freq: Two times a day (BID) | ORAL | Status: AC
  Administered 2012-07-06 (×2): 40 meq via ORAL
  Filled 2012-07-06 (×2): qty 2

## 2012-07-06 MED ORDER — METOPROLOL TARTRATE 1 MG/ML IV SOLN: 5.0000 mg | Freq: Four times a day (QID) | INTRAVENOUS | Status: DC | PRN

## 2012-07-06 MED ORDER — FUROSEMIDE 10 MG/ML IJ SOLN
4.0000 mg/h | INTRAVENOUS | Status: DC
  Administered 2012-07-06: 8 mg/h via INTRAVENOUS
  Administered 2012-07-08 – 2012-07-10 (×2): 4 mg/h via INTRAVENOUS
  Filled 2012-07-06 (×8): qty 25

## 2012-07-06 MED ORDER — DIGOXIN 125 MCG PO TABS
0.1250 mg | ORAL_TABLET | Freq: Every day | ORAL | Status: DC
  Administered 2012-07-06: 0.125 mg via ORAL
  Filled 2012-07-06 (×2): qty 1

## 2012-07-06 MED ORDER — DIGOXIN 125 MCG PO TABS: 0.1250 mg | ORAL_TABLET | Freq: Every day | ORAL | Status: DC

## 2012-07-06 MED ORDER — DILTIAZEM LOAD VIA INFUSION
10.0000 mg | Freq: Once | INTRAVENOUS | Status: AC
  Administered 2012-07-06: 10 mg via INTRAVENOUS
  Filled 2012-07-06: qty 10

## 2012-07-06 NOTE — Evaluation (Signed)
Occupational Therapy Evaluation Patient Details Name: [name] MRN: [id number] DOB: [birth year] Today's Date: 07/06/2012 Time: 7829-5621 OT Time Calculation (min): 26 min  OT Assessment / Plan / Recommendation Clinical Impression  This 79 yo male admitted increased SOB, increased LE edema, fall to floor presents to acute OT with problems below. Will benefit from acute OT with follow up OT at SNF.    OT Assessment  Patient needs continued OT Services    Follow Up Recommendations  SNF    Barriers to Discharge Decreased caregiver support    Equipment Recommendations  None recommended by OT       Frequency  Min 2X/week    Precautions / Restrictions Precautions Precautions: Fall Precaution Comments: watch HR   Pertinent Vitals/Pain Left groin area    ADL  Eating/Feeding: Simulated;Independent Where Assessed - Eating/Feeding: Bed level Grooming: Simulated;Set up Where Assessed - Grooming: Unsupported sitting Upper Body Bathing: Simulated;Set up Where Assessed - Upper Body Bathing: Unsupported sitting Lower Body Bathing: Simulated;Maximal assistance Where Assessed - Lower Body Bathing: Supported sit to stand Upper Body Dressing: Simulated;Moderate assistance Where Assessed - Upper Body Dressing: Unsupported sitting Lower Body Dressing: Simulated;+1 Total assistance Where Assessed - Lower Body Dressing: Supported sit to stand Equipment Used: Gait belt;Rolling walker Transfers/Ambulation Related to ADLs: Min A sit to stand and stand to sit ADL Comments: Quite swollen LEs due to fluid overload    OT Diagnosis: Generalized weakness;Acute pain;Cognitive deficits  OT Problem List: Decreased strength;Decreased activity tolerance;Impaired balance (sitting and/or standing);Decreased cognition;Pain;Increased edema;Decreased knowledge of use of DME or AE OT Treatment Interventions: Self-care/ADL training;DME and/or AE instruction;Therapeutic activities;Cognitive  remediation/compensation;Patient/family education;Balance training   OT Goals Acute Rehab OT Goals OT Goal Formulation: With patient Time For Goal Achievement: 07/20/12 Potential to Achieve Goals: Good ADL Goals Pt Will Perform Grooming: Unsupported;Standing at sink (min guard A) ADL Goal: Grooming - Progress: Goal set today Pt Will Perform Lower Body Dressing: with min assist;Unsupported;with adaptive equipment;Sit to stand from chair;Sit to stand from bed ADL Goal: Lower Body Dressing - Progress: Goal set today Pt Will Transfer to Toilet: with min assist;Ambulation;Comfort height toilet;Grab bars;3-in-1;with DME ADL Goal: Toilet Transfer - Progress: Goal set today Pt Will Perform Toileting - Clothing Manipulation: with min assist;Standing ADL Goal: Toileting - Clothing Manipulation - Progress: Goal set today Pt Will Perform Toileting - Hygiene: with min assist;Sit to stand from 3-in-1/toilet ADL Goal: Toileting - Hygiene - Progress: Goal set today Miscellaneous OT Goals Miscellaneous OT Goal #1: Pt will roll left and right with supervision to A with BADLs. OT Goal: Miscellaneous Goal #1 - Progress: Goal set today Miscellaneous OT Goal #2: Pt will come up to sit with supervision in prep for transfers OT Goal: Miscellaneous Goal #2 - Progress: Goal set today  Visit Information  Last OT Received On: 07/06/12 Assistance Needed: +2 PT/OT Co-Evaluation/Treatment: Yes    Subjective Data  Subjective: I can't stand or walk   Prior Functioning     Home Living Lives With: Son Available Help at Discharge: Family;Available PRN/intermittently (son works 12-5) Type of Home: House Home Access: Stairs to enter Secretary/administrator of Steps: 5 Entrance Stairs-Rails: Left Home Layout: One level Bathroom Shower/Tub: Forensic scientist: Standard Home Adaptive Equipment: Other (comment);Straight cane (rails outside tub) Prior Function Level of Independence: Needs  assistance Needs Assistance: Meal Prep;Light Housekeeping;Bathing Bath: Supervision/set-up Meal Prep: Total Light Housekeeping: Total Able to Take Stairs?: No Driving: No Vocation: Retired Musician: 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  Vision/Perception Vision - History Baseline Vision: No visual deficits   Cognition  Cognition Overall Cognitive Status: Impaired Area of Impairment: Safety/judgement Arousal/Alertness: Awake/alert Orientation Level: Appears intact for tasks assessed Behavior During Session: [facility] for tasks performed Safety/Judgement: Decreased safety judgement for tasks assessed Safety/Judgement - Other Comments: pt unable to judge where EOB was and thought he is was a falling although not at EOB    Extremity/Trunk Assessment Right Upper Extremity Assessment RUE ROM/Strength/Tone: [facility] for tasks assessed Left Upper Extremity Assessment LUE ROM/Strength/Tone: [facility] for tasks assessed Right Lower Extremity Assessment RLE ROM/Strength/Tone: Within functional levels (4/5 hip and knee flexion and knee extension) Left Lower Extremity Assessment LLE ROM/Strength/Tone: Deficits LLE ROM/Strength/Tone Deficits: 3/5 hip flexion, knee extension and knee flexion, 4/5 dorsiflexion Trunk Assessment Trunk Assessment: Normal     Mobility Bed Mobility Bed Mobility: Rolling Right;Rolling Left;Right Sidelying to Sit;Sit to Sidelying Right Rolling Right: 4: Min assist Rolling Left: 2: Max assist Right Sidelying to Sit: 3: Mod assist;HOB flat Sit to Sidelying Right: 2: Max assist;HOB flat Details for Bed Mobility Assistance: cueing for bending opposite leg and reaching for rail with assist to bend LLE and hand over hand guidance to grasp rail. Assist to move legs off bed and elevate trunk to sitting and max assist to control trunk and bring legs onto bed with return to supine Transfers Sit to Stand: 4: Min assist;From elevated surface;From bed Stand to Sit: 4: Min  assist Details for Transfer Assistance: lef and t knee blocked in standing with one period of partial buckling but pt able to recover without assist        Balance Static Sitting Balance Static Sitting - Balance Support: Bilateral upper extremity supported;Feet unsupported Static Sitting - Level of Assistance: 5: Stand by assistance Static Sitting - Comment/# of Minutes: 4, posterior lean with bil LE movement    End of Session OT - End of Session Equipment Utilized During Treatment: Gait belt (RW) Activity Tolerance: Patient tolerated treatment well (despite him saying he could not do things) Patient left: in bed;with call bell/phone within reach Nurse Communication:  (Nursing had already had him up earlier to [facility])       Evette Georges [phone] 07/06/2012, 12:14 PM

## 2012-07-06 NOTE — Progress Notes (Signed)
Utilization Review Completed Dayra Rapley J. Achsah Mcquade, RN, BSN, NCM [phone]  

## 2012-07-06 NOTE — H&P (Signed)
[facility] PROGRESS NOTE Interim History: 79 y.o. male with past medical history of hypertension, lower extremity edema and chronic kidney disease. Patient came to the hospital after he collapsed to the floor today secondary to a generalized weakness. Patient said for the past 2 weeks she's been getting weaker, having more shortness of breath with exertion, and most of all having worsening of his lower extremity edema. Patient has some cough but no significant sputum production, denies chest pain, palpitations or orthopnea. Upon initial evaluation in the emergency department chest x-ray showed mild CHF and his lower extremity showed massive edema. His BUN is 42 and creatinine is 1.3. Has BNP of 2900, previously was about 475. Weight 108 kg  Assessment/Plan:   General weakness due to Acute CHF - Change to lasix drip. Strict I and O's.  Monitor electrolytes. Diureses 2 L overnight. But Pt relates he does not see the difference. Bp is borderline low. + JVD crackles at bases and still SOB. - replete as needed. Check a Mag. - Cycle cardiac enzymes. EKG atrial fibrillation. - Echo pending 2.20.2014  Aib with RVR: - most likely due to ADHF. Monitor on telemetry. - continue Xarelto. - continue Beta blocker coreg. IV PRN metoprolol.    Type II or unspecified type diabetes mellitus with other specified manifestations, uncontrolled: - blood glucose well controlled. - cont SSI. HbgA1c 6.5   HYPERLIPIDEMIA - statins.   HYPERTENSION - borderline low, continue coreg.   Code Status: Full code  Family Communication: Discussed with the patient and his son daughter are at bedside.  Disposition Plan: Telemetry, inpatient, and anticipate length of stay to be greater than 2 midnights.  Consultants:  none  Procedures:  Echo 2.201.2014:  Antibiotics:  none (indicate start date, and stop date if known)  HPI/Subjective: -Relates his SOB is unchanged. - continue to be sitting up as this  help with his breathing.  Objective: Filed Vitals:   07/05/12 1831 07/05/12 2019 07/06/12 0300 07/06/12 0531  BP: 90/50 102/61 100/61 101/61  Pulse:  98 116 103  Temp:  98.1 F (36.7 C) 97.3 F (36.3 C) 98.5 F (36.9 C)  TempSrc:  Oral Oral Oral  Resp:  20 16 17   Height:      Weight:    108 kg (238 lb 1.6 oz)  SpO2:  96% 95% 95%    Intake/Output Summary (Last 24 hours) at 07/06/12 0936 Last data filed at 07/06/12 0929  Gross per 24 hour  Intake    220 ml  Output   2700 ml  Net  -2480 ml   Filed Weights   07/05/12 1637 07/06/12 0531  Weight: 108.5 kg (239 lb 3.2 oz) 108 kg (238 lb 1.6 oz)    Exam:  General: Alert, awake, oriented x3, in no acute distress.  HEENT: No bruits, no goiter. +JVD Heart: Regular rate and rhythm, without murmurs, rubs, gallops.  Lungs: Good air movement, crackles B/L  Abdomen: Soft, nontender, nondistended, positive bowel sounds.  Neuro: Grossly intact, nonfocal.   Data Reviewed: Basic Metabolic Panel:  Recent Labs Lab 07/05/12 1036 07/06/12 0440  NA 139 141  K 3.8 3.3*  CL 95* 96  CO2 36* 38*  GLUCOSE 114* 92  BUN 42* 31*  CREATININE 1.30 1.09  CALCIUM 9.5 9.0   Liver Function Tests:  Recent Labs Lab 07/05/12 1036  AST 16  ALT 10  ALKPHOS 77  BILITOT 0.4  PROT 6.7  ALBUMIN 3.6   No results found for this basename: LIPASE,  AMYLASE,  in the last 168 hours No results found for this basename: AMMONIA,  in the last 168 hours CBC:  Recent Labs Lab 07/05/12 1036 07/06/12 0440  WBC 6.8 4.9  NEUTROABS 3.7  --   HGB 10.3* 9.3*  HCT 31.8* 28.9*  MCV 90.3 89.8  PLT 229 210   Cardiac Enzymes: No results found for this basename: CKTOTAL, CKMB, CKMBINDEX, TROPONINI,  in the last 168 hours BNP (last 3 results)  Recent Labs  01/23/12 1100 02/28/12 0941 07/05/12 1102  PROBNP 1194.0* 475.0* 2974.0*   CBG:  Recent Labs Lab 07/05/12 1610 07/05/12 2106 07/06/12 0548  GLUCAP 106* 149* 89    No results found for  this or any previous visit (from the past 240 hour(s)).   Studies: Dg Chest 2 View  07/05/2012  *RADIOLOGY REPORT*  Clinical Data: Weakness.  CHEST - 2 VIEW  Comparison: 01/18/2012.  Findings: Cardiomegaly.  Pulmonary vascular congestion.  Tortuous thoracic aorta.  No pleural effusion.  Basilar atelectasis.  Lower lung volumes than prior. No alveolar edema.  IMPRESSION: Mild CHF.   Original Report Authenticated By: Andreas Newport, M.D.     Scheduled Meds: . carvedilol  3.125 mg Oral BID WC  . furosemide  40 mg Intravenous BID  . gabapentin  600 mg Oral TID  . insulin aspart  0-9 Units Subcutaneous TID WC  . pantoprazole  40 mg Oral Q breakfast  . polyethylene glycol  17 g Oral Daily  . potassium chloride  40 mEq Oral BID  . Rivaroxaban  20 mg Oral Q supper  . sodium chloride  3 mL Intravenous Q12H  . Tamsulosin HCl  0.4 mg Oral QPC breakfast  . tiotropium  18 mcg Inhalation Daily   Continuous Infusions:    Marinda Elk  [facility] Pager [phone].  If 8PM-8AM, please contact night-coverage at [email or web address], password [facility] 07/06/2012, 9:36 AM  LOS: 1 day

## 2012-07-06 NOTE — Progress Notes (Signed)
Pt's HR went up to 180's nonsustained while sitting on the side of the bed.  Pt is now sustaining 130's-150's and is SOB.  Dr. Radonna Ricker notified and order placed to give IV metoprolol 5mg  now.  Will carry out MD orders and continue to monitor.

## 2012-07-06 NOTE — Evaluation (Signed)
Physical Therapy Evaluation Patient Details Name: [name] MRN: [id number] DOB: [birth year] Today's Date: 07/06/2012 Time: 4098-1191 PT Time Calculation (min): 22 min  PT Assessment / Plan / Recommendation Clinical Impression  Pt is  admitted with weakness, collapse, SOB and edema. Pt reports prog in a will progressive decline over last 2 months and increased over last 2 weeks. Pt fearful of falling with increased HR with rolling and return to supine. Pt will benefit from acute therapy to maximize mobility, strenght and function to decrease burden of care at discharge.     PT Assessment  Patient needs continued PT services    Follow Up Recommendations  SNF;Supervision for mobility/OOB    Does the patient have the potential to tolerate intense rehabilitation      Barriers to Discharge Decreased caregiver support      Equipment Recommendations  Rolling walker with 5" wheels    Recommendations for Other Services     Frequency Min 3X/week    Precautions / Restrictions Precautions Precautions: Fall Precaution Comments: watch HR   Pertinent Vitals/Pain HR 115 on arrival with sats 93% on 2L With rolling to left HR up to 130 With transfer to sitting and standing HR 126 With return to supine HR 139 sats 88% on RA and returned to 2L      Mobility  Bed Mobility Bed Mobility: Rolling Right;Rolling Left;Right Sidelying to Sit;Sit to Sidelying Right Rolling Right: 4: Min assist Rolling Left: 2: Max assist Right Sidelying to Sit: 3: Mod assist;HOB flat Sit to Sidelying Right: 2: Max assist;HOB flat Details for Bed Mobility Assistance: cueing for bending opposite leg and reaching for rail with assist to bend LLE and hand over hand guidance to grasp rail. Assist to move legs off bed and elevate trunk to sitting and max assist to control trunk and bring legs onto bed with return to supine Transfers Transfers: Sit to Stand;Stand to Sit Sit to Stand: 4: Min assist;From elevated  surface;From bed Stand to Sit: 4: Min assist Details for Transfer Assistance: lef and t knee blocked in standing with one period of partial buckling but pt able to recover without assist Ambulation/Gait Ambulation/Gait Assistance: Not tested (comment)    Exercises     PT Diagnosis: Difficulty walking  PT Problem List: Decreased strength;Decreased activity tolerance;Decreased balance;Decreased mobility;Decreased knowledge of use of DME;Cardiopulmonary status limiting activity PT Treatment Interventions: Gait training;DME instruction;Functional mobility training;Therapeutic activities;Therapeutic exercise;Patient/family education   PT Goals Acute Rehab PT Goals PT Goal Formulation: With patient Time For Goal Achievement: 07/20/12 Potential to Achieve Goals: Fair Pt will go Supine/Side to Sit: with min assist;with HOB 0 degrees PT Goal: Supine/Side to Sit - Progress: Goal set today Pt will go Sit to Supine/Side: with min assist;with HOB 0 degrees PT Goal: Sit to Supine/Side - Progress: Goal set today Pt will go Sit to Stand: with supervision PT Goal: Sit to Stand - Progress: Goal set today Pt will go Stand to Sit: with supervision PT Goal: Stand to Sit - Progress: Goal set today Pt will Transfer Bed to Chair/Chair to Bed: with mod assist PT Transfer Goal: Bed to Chair/Chair to Bed - Progress: Goal set today Pt will Ambulate: 16 - 50 feet;with min assist;with least restrictive assistive device PT Goal: Ambulate - Progress: Goal set today Pt will Perform Home Exercise Program: with supervision, verbal cues required/provided PT Goal: Perform Home Exercise Program - Progress: Goal set today  Visit Information  Last PT Received On: 07/06/12 Assistance Needed: +2 PT/OT Co-Evaluation/Treatment:  Yes    Subjective Data  Subjective: I have been getting worse the last 2 weeks Patient Stated Goal: be able to walk   Prior Functioning  Home Living Lives With: Son Available Help at  Discharge: Family;Available PRN/intermittently (son works 12-5) Type of Home: House Home Access: Stairs to enter Secretary/administrator of Steps: 5 Entrance Stairs-Rails: Left Home Layout: One level Bathroom Shower/Tub: Forensic scientist: Standard Home Adaptive Equipment: Other (comment);Straight cane (rails outside tub) Prior Function Level of Independence: Needs assistance Needs Assistance: Meal Prep;Light Housekeeping;Bathing Bath: Supervision/set-up Meal Prep: Total Light Housekeeping: Total Able to Take Stairs?: No Driving: No Vocation: Retired Musician: 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    Copywriter, advertising Overall Cognitive Status: Impaired Area of Impairment: Safety/judgement Arousal/Alertness: Awake/alert Orientation Level: Appears intact for tasks assessed Behavior During Session: WFL for tasks performed Safety/Judgement: Decreased safety judgement for tasks assessed Safety/Judgement - Other Comments: pt unable to judge where EOB was and thought he is was a falling although not at EOB    Extremity/Trunk Assessment Right Upper Extremity Assessment RUE ROM/Strength/Tone: [facility] for tasks assessed Left Upper Extremity Assessment LUE ROM/Strength/Tone: [facility] for tasks assessed Right Lower Extremity Assessment RLE ROM/Strength/Tone: Within functional levels (4/5 hip and knee flexion and knee extension) Left Lower Extremity Assessment LLE ROM/Strength/Tone: Deficits LLE ROM/Strength/Tone Deficits: 3/5 hip flexion, knee extension and knee flexion, 4/5 dorsiflexion Trunk Assessment Trunk Assessment: Normal   Balance Static Sitting Balance Static Sitting - Balance Support: Bilateral upper extremity supported;Feet unsupported Static Sitting - Level of Assistance: 5: Stand by assistance Static Sitting - Comment/# of Minutes: 4, posterior lean with bil LE movement   End of Session PT - End of Session Equipment Utilized During Treatment: Gait  belt Activity Tolerance: Patient tolerated treatment well;Other (comment) (limited activity due to HR) Patient left: in bed;with call bell/phone within reach Nurse Communication: Mobility status  GP     Toney Sang [facility] 07/06/2012, 10:23 AM Delaney Meigs, PT [phone]

## 2012-07-06 NOTE — Progress Notes (Signed)
Pt started on Cardizem gtt 70ml/hr, pt BP after 45 min of administration is 88/44 HR 107.  Stopped Cardizem and paged Dr. Radonna Ricker.

## 2012-07-07 ENCOUNTER — Encounter (HOSPITAL_COMMUNITY)
Admission: EM | Disposition: A | Discharge status: Skilled Nursing Facility | Payer: Self-pay | Source: Home / Self Care | Attending: Internal Medicine

## 2012-07-07 ENCOUNTER — Encounter (HOSPITAL_COMMUNITY): Payer: Self-pay | Admitting: Certified Registered"

## 2012-07-07 ENCOUNTER — Inpatient Hospital Stay (HOSPITAL_COMMUNITY): Payer: Medicare Other | Admitting: Certified Registered"

## 2012-07-07 ENCOUNTER — Inpatient Hospital Stay (HOSPITAL_COMMUNITY): Payer: Medicare Other

## 2012-07-07 ENCOUNTER — Encounter (HOSPITAL_COMMUNITY): Payer: Self-pay

## 2012-07-07 HISTORY — PX: CARDIOVERSION: SHX1299

## 2012-07-07 HISTORY — PX: TRANSTHORACIC ECHOCARDIOGRAM: SHX275

## 2012-07-07 LAB — BASIC METABOLIC PANEL
BUN: 21 mg/dL (ref 6–23)
Chloride: 97 mEq/L (ref 96–112)
GFR calc Af Amer: 72 mL/min — ABNORMAL LOW (ref >90)
Glucose, Bld: 149 mg/dL — ABNORMAL HIGH (ref 70–99)
Potassium: 3.5 mEq/L (ref 3.5–5.1)
Sodium: 142 mEq/L (ref 135–145)

## 2012-07-07 SURGERY — CARDIOVERSION
Anesthesia: General

## 2012-07-07 MED ORDER — METOPROLOL TARTRATE 1 MG/ML IV SOLN
10.0000 mg | Freq: Four times a day (QID) | INTRAVENOUS | Status: DC | PRN
  Administered 2012-07-07: 10 mg via INTRAVENOUS

## 2012-07-07 MED ORDER — DIGOXIN 250 MCG PO TABS
0.2500 mg | ORAL_TABLET | Freq: Every day | ORAL | Status: DC
  Administered 2012-07-07 – 2012-07-14 (×8): 0.25 mg via ORAL
  Filled 2012-07-07 (×8): qty 1

## 2012-07-07 MED ORDER — CARVEDILOL 6.25 MG PO TABS: 6.2500 mg | ORAL_TABLET | Freq: Two times a day (BID) | ORAL | Status: DC

## 2012-07-07 MED ORDER — CARVEDILOL 3.125 MG PO TABS
3.1250 mg | ORAL_TABLET | Freq: Two times a day (BID) | ORAL | Status: DC
  Administered 2012-07-08: 3.125 mg via ORAL
  Filled 2012-07-07 (×4): qty 1

## 2012-07-07 MED ORDER — SODIUM CHLORIDE 0.9 % IV SOLN
INTRAVENOUS | Status: DC | PRN
  Administered 2012-07-07: 15:00:00 via INTRAVENOUS

## 2012-07-07 MED ORDER — PROPOFOL 10 MG/ML IV BOLUS
INTRAVENOUS | Status: DC | PRN
  Administered 2012-07-07: 50 mg via INTRAVENOUS

## 2012-07-07 MED ORDER — AMIODARONE HCL 200 MG PO TABS
400.0000 mg | ORAL_TABLET | Freq: Two times a day (BID) | ORAL | Status: DC
  Administered 2012-07-07 – 2012-07-14 (×14): 400 mg via ORAL
  Filled 2012-07-07 (×15): qty 2

## 2012-07-07 MED ORDER — METOPROLOL TARTRATE 1 MG/ML IV SOLN
5.0000 mg | Freq: Four times a day (QID) | INTRAVENOUS | Status: DC | PRN
  Administered 2012-07-07: 5 mg via INTRAVENOUS
  Filled 2012-07-07: qty 5

## 2012-07-07 MED ORDER — SODIUM CHLORIDE 0.9 % IV SOLN
INTRAVENOUS | Status: DC
  Administered 2012-07-07: 10 mL/h via INTRAVENOUS

## 2012-07-07 NOTE — Consult Note (Signed)
Pt. Seen and examined. Agree with the NP/[name] note as written.   79 yo male with atrial fibrillation (newly diagnosed at his last hospitalization in 01/2012.  He was treated for heart failure as well, but never saw a cardiologist. He has recently been having increasing weakness, LE edema, fatigue and periods of hypotension. Found to be in a-fib with RVR.  Repeat echo is pending to evaluate for new cardiomyopathy (his EF was previously 65-70%). He has been difficult to rate control for hospital medicine, mainly due to hypotension.  Digoxin was added.   Plan:   I agree with the plan to decrease the lasix gtts rate to slow diuresis and allow more blood pressure. He deserves a chance to achieve sinus rhythm which may help to control BP and treat his diastolic HF.  Will ask him to stay NPO and try to arrange for cardioversion either in endoscopy or at bedside later today with anesthesia.  Thanks for consulting [place]. We will follow along with you.  Chrystie Nose, MD, [facility] Attending Cardiologist [facility]

## 2012-07-07 NOTE — Progress Notes (Signed)
Notify [name] that pt is going to Endo and coming off tele.   Pt place on endo monitor.  [name] in endo transporting pt.  [name], Charity fundraiser.

## 2012-07-07 NOTE — CV Procedure (Signed)
[facility]  CARDIOVERSION NOTE   Procedure: Electrical Cardioversion Indications:  Atrial Fibrillation  Procedure Details:  Consent: Risks of procedure as well as the alternatives and risks of each were explained to the (patient/caregiver).  Consent for procedure obtained.  Time Out: Verified patient identification, verified procedure, site/side was marked, verified correct patient position, special equipment/implants available, medications/allergies/relevent history reviewed, required imaging and test results available.  Performed  Patient placed on cardiac monitor, pulse oximetry, supplemental oxygen as necessary.  Sedation given: Propofol per anesthesia (50 mg) Pacer pads placed anterior and posterior chest.  Cardioverted 2 time(s).  Cardioverted at 150J and 200J biphasic.  Impression: Findings: Post procedure EKG shows: Atrial Fibrillation Complications: None Patient did tolerate procedure well.  Plan: 1. I would recommend starting amiodarone 400 mg po BID for additional rate control and to facilitate repeat cardioversion attempt in the future. EF is noted to be 40-45% on echo, possibly due to tachycardia. He would benefit from being in sinus rhythm if we can achieve that.   Time Spent Directly with the Patient:  30 minutes   Chrystie Nose, MD, [facility] Attending Cardiologist [facility]  Brysin Towery C 07/07/2012, 3:27 PM

## 2012-07-07 NOTE — Consult Note (Signed)
Reason for Consult: CHF, Afib, Hypotension Referring Physician:  Cardiologist:  Ellen Henri Dirocco is an 79 y.o. male.  HPI:    The patient is an 79 yo male with a history of Afib since October 2013, for which he has been taking Xarelto, obesity, HTN, asthma, CKD, Afib, GERD, CHF.  Echo from Sept. 2013 showed an EF of 65-70% Peak PA pressure of , Moderate LVH.  The patient presented with weakness which resulted in him collapsing to the floor, in a fairly controlled manner, in his home while walking to the bedroom.  He maintains DOE particularly with upper body exertion.  He also reports LEE for the last twenty years, abd distention, but does not complain of N, V, CP, orthopnea, PND, ABD pain.     Past Medical History  Diagnosis Date  . Obesity   . Hypertension   . Edema   . BPH (benign prostatic hypertrophy)   . Asthma   . Bronchospasm, exercise-induced   . Chronic kidney disease   . Arthritis   . CHF (congestive heart failure)   . GERD (gastroesophageal reflux disease)   . A-fib   . Shortness of breath     Past Surgical History  Procedure Laterality Date  . Vascular surgery  removed varicose veins  . Vasectomy      Family History  Problem Relation Age of Onset  . Alzheimer's disease Mother   . Heart disease Father     Social History:  reports that he quit smoking about 48 years ago. He has never used smokeless tobacco. He reports that he does not drink alcohol or use illicit drugs.  Allergies:  Allergies  Allergen Reactions  . Nsaids   . Other     Opiates cause tightness in chest  . Vicodin (Hydrocodone-Acetaminophen) Other (See Comments)    Unknown reaction many years ago  . Morphine And Related Nausea And Vomiting    Medications: . carvedilol  3.125 mg Oral BID WC  . digoxin  0.25 mg Oral Daily  . gabapentin  600 mg Oral TID  . insulin aspart  0-9 Units Subcutaneous TID WC  . pantoprazole  40 mg Oral Q breakfast  . polyethylene glycol  17 g Oral  Daily  . Rivaroxaban  20 mg Oral Q supper  . sodium chloride  3 mL Intravenous Q12H  . Tamsulosin HCl  0.4 mg Oral QPC breakfast  . tiotropium  18 mcg Inhalation Daily     Results for orders placed during the hospital encounter of 07/05/12 (from the past 48 hour(s))  CBC WITH DIFFERENTIAL     Status: Abnormal   Collection Time    07/05/12 10:36 AM      Result Value Range   WBC 6.8  4.0 - 10.5 K/uL   RBC 3.52 (*) 4.22 - 5.81 MIL/uL   Hemoglobin 10.3 (*) 13.0 - 17.0 g/dL   HCT 40.9 (*) 81.1 - 91.4 %   MCV 90.3  78.0 - 100.0 fL   MCH 29.3  26.0 - 34.0 pg   MCHC 32.4  30.0 - 36.0 g/dL   RDW 78.2 (*) 95.6 - 21.3 %   Platelets 229  150 - 400 K/uL   Neutrophils Relative 55  43 - 77 %   Neutro Abs 3.7  1.7 - 7.7 K/uL   Lymphocytes Relative 26  12 - 46 %   Lymphs Abs 1.8  0.7 - 4.0 K/uL   Monocytes Relative 18 (*) 3 - 12 %  Monocytes Absolute 1.2 (*) 0.1 - 1.0 K/uL   Eosinophils Relative 1  0 - 5 %   Eosinophils Absolute 0.1  0.0 - 0.7 K/uL   Basophils Relative 0  0 - 1 %   Basophils Absolute 0.0  0.0 - 0.1 K/uL  COMPREHENSIVE METABOLIC PANEL     Status: Abnormal   Collection Time    07/05/12 10:36 AM      Result Value Range   Sodium 139  135 - 145 mEq/L   Potassium 3.8  3.5 - 5.1 mEq/L   Chloride 95 (*) 96 - 112 mEq/L   CO2 36 (*) 19 - 32 mEq/L   Glucose, Bld 114 (*) 70 - 99 mg/dL   BUN 42 (*) 6 - 23 mg/dL   Creatinine, Ser 1.61  0.50 - 1.35 mg/dL   Calcium 9.5  8.4 - 09.6 mg/dL   Total Protein 6.7  6.0 - 8.3 g/dL   Albumin 3.6  3.5 - 5.2 g/dL   AST 16  0 - 37 U/L   ALT 10  0 - 53 U/L   Alkaline Phosphatase 77  39 - 117 U/L   Total Bilirubin 0.4  0.3 - 1.2 mg/dL   GFR calc non Af Amer 49 (*) >90 mL/min   GFR calc Af Amer 57 (*) >90 mL/min   Comment:            The eGFR has been calculated     using the CKD EPI equation.     This calculation has not been     validated in all clinical     situations.     eGFR's persistently     <90 mL/min signify     possible  Chronic Kidney Disease.  PRO B NATRIURETIC PEPTIDE     Status: Abnormal   Collection Time    07/05/12 11:02 AM      Result Value Range   Pro B Natriuretic peptide (BNP) 2974.0 (*) 0 - 450 pg/mL  URINALYSIS, ROUTINE W REFLEX MICROSCOPIC     Status: Abnormal   Collection Time    07/05/12 11:06 AM      Result Value Range   Color, Urine YELLOW  YELLOW   APPearance CLEAR  CLEAR   Specific Gravity, Urine 1.017  1.005 - 1.030   pH 5.0  5.0 - 8.0   Glucose, UA NEGATIVE  NEGATIVE mg/dL   Hgb urine dipstick NEGATIVE  NEGATIVE   Bilirubin Urine NEGATIVE  NEGATIVE   Ketones, ur NEGATIVE  NEGATIVE mg/dL   Protein, ur NEGATIVE  NEGATIVE mg/dL   Urobilinogen, UA 0.2  0.0 - 1.0 mg/dL   Nitrite NEGATIVE  NEGATIVE   Leukocytes, UA SMALL (*) NEGATIVE  URINE MICROSCOPIC-ADD ON     Status: None   Collection Time    07/05/12 11:06 AM      Result Value Range   Squamous Epithelial / LPF RARE  RARE   WBC, UA 7-10  <3 WBC/hpf   RBC / HPF 0-2  <3 RBC/hpf   Bacteria, UA RARE  RARE  POCT I-STAT TROPONIN I     Status: None   Collection Time    07/05/12  2:03 PM      Result Value Range   Troponin i, poc 0.01  0.00 - 0.08 ng/mL   Comment 3            Comment: Due to the release kinetics of cTnI,     a negative result within the first  hours     of the onset of symptoms does not rule out     myocardial infarction with certainty.     If myocardial infarction is still suspected,     repeat the test at appropriate intervals.  TSH     Status: None   Collection Time    07/05/12  4:00 PM      Result Value Range   TSH 2.341  0.350 - 4.500 uIU/mL  HEMOGLOBIN A1C     Status: Abnormal   Collection Time    07/05/12  4:00 PM      Result Value Range   Hemoglobin A1C 6.5 (*) <5.7 %   Comment: (NOTE)                                                                               According to the ADA Clinical Practice Recommendations for 2011, when     HbA1c is used as a screening test:      >=6.5%   Diagnostic  of Diabetes Mellitus               (if abnormal result is confirmed)     5.7-6.4%   Increased risk of developing Diabetes Mellitus     References:Diagnosis and Classification of Diabetes Mellitus,Diabetes     Care,2011,34(Suppl 1):S62-S69 and Standards of Medical Care in             Diabetes - 2011,Diabetes Care,2011,34 (Suppl 1):S11-S61.   Mean Plasma Glucose 140 (*) <117 mg/dL  GLUCOSE, CAPILLARY     Status: Abnormal   Collection Time    07/05/12  4:10 PM      Result Value Range   Glucose-Capillary 106 (*) 70 - 99 mg/dL   Comment 1 Documented in Chart     Comment 2 Notify RN    URINE CULTURE     Status: None   Collection Time    07/05/12  8:26 PM      Result Value Range   Specimen Description URINE, RANDOM     Special Requests NONE     Culture  Setup Time 07/05/2012 21:28     Colony Count >=100,000 COLONIES/ML     Culture       Value: Multiple bacterial morphotypes present, none predominant. Suggest appropriate recollection if clinically indicated.   Report Status 07/06/2012 FINAL    GLUCOSE, CAPILLARY     Status: Abnormal   Collection Time    07/05/12  9:06 PM      Result Value Range   Glucose-Capillary 149 (*) 70 - 99 mg/dL   Comment 1 Notify RN    BASIC METABOLIC PANEL     Status: Abnormal   Collection Time    07/06/12  4:40 AM      Result Value Range   Sodium 141  135 - 145 mEq/L   Potassium 3.3 (*) 3.5 - 5.1 mEq/L   Chloride 96  96 - 112 mEq/L   CO2 38 (*) 19 - 32 mEq/L   Glucose, Bld 92  70 - 99 mg/dL   BUN 31 (*) 6 - 23 mg/dL   Creatinine, Ser 4.69  0.50 - 1.35 mg/dL   Calcium 9.0  8.4 - 10.5 mg/dL   GFR calc non Af Amer 61 (*) >90 mL/min   GFR calc Af Amer 70 (*) >90 mL/min   Comment:            The eGFR has been calculated     using the CKD EPI equation.     This calculation has not been     validated in all clinical     situations.     eGFR's persistently     <90 mL/min signify     possible Chronic Kidney Disease.  CBC     Status: Abnormal    Collection Time    07/06/12  4:40 AM      Result Value Range   WBC 4.9  4.0 - 10.5 K/uL   RBC 3.22 (*) 4.22 - 5.81 MIL/uL   Hemoglobin 9.3 (*) 13.0 - 17.0 g/dL   HCT 40.9 (*) 81.1 - 91.4 %   MCV 89.8  78.0 - 100.0 fL   MCH 28.9  26.0 - 34.0 pg   MCHC 32.2  30.0 - 36.0 g/dL   RDW 78.2 (*) 95.6 - 21.3 %   Platelets 210  150 - 400 K/uL  GLUCOSE, CAPILLARY     Status: None   Collection Time    07/06/12  5:48 AM      Result Value Range   Glucose-Capillary 89  70 - 99 mg/dL  TROPONIN I     Status: None   Collection Time    07/06/12  9:36 AM      Result Value Range   Troponin I <0.30  <0.30 ng/mL   Comment:            Due to the release kinetics of cTnI,     a negative result within the first hours     of the onset of symptoms does not rule out     myocardial infarction with certainty.     If myocardial infarction is still suspected,     repeat the test at appropriate intervals.  URINALYSIS, ROUTINE W REFLEX MICROSCOPIC     Status: Abnormal   Collection Time    07/06/12 11:07 AM      Result Value Range   Color, Urine YELLOW  YELLOW   APPearance CLEAR  CLEAR   Specific Gravity, Urine 1.013  1.005 - 1.030   pH 7.0  5.0 - 8.0   Glucose, UA NEGATIVE  NEGATIVE mg/dL   Hgb urine dipstick NEGATIVE  NEGATIVE   Bilirubin Urine NEGATIVE  NEGATIVE   Ketones, ur NEGATIVE  NEGATIVE mg/dL   Protein, ur NEGATIVE  NEGATIVE mg/dL   Urobilinogen, UA 0.2  0.0 - 1.0 mg/dL   Nitrite NEGATIVE  NEGATIVE   Leukocytes, UA MODERATE (*) NEGATIVE  URINE MICROSCOPIC-ADD ON     Status: None   Collection Time    07/06/12 11:07 AM      Result Value Range   Squamous Epithelial / LPF RARE  RARE   WBC, UA 7-10  <3 WBC/hpf   RBC / HPF 3-6  <3 RBC/hpf   Bacteria, UA RARE  RARE  TROPONIN I     Status: None   Collection Time    07/06/12  3:45 PM      Result Value Range   Troponin I <0.30  <0.30 ng/mL   Comment:            Due to the release kinetics of cTnI,     a negative result within  the first hours      of the onset of symptoms does not rule out     myocardial infarction with certainty.     If myocardial infarction is still suspected,     repeat the test at appropriate intervals.  TROPONIN I     Status: None   Collection Time    07/06/12  9:12 PM      Result Value Range   Troponin I <0.30  <0.30 ng/mL   Comment:            Due to the release kinetics of cTnI,     a negative result within the first hours     of the onset of symptoms does not rule out     myocardial infarction with certainty.     If myocardial infarction is still suspected,     repeat the test at appropriate intervals.  GLUCOSE, CAPILLARY     Status: Abnormal   Collection Time    07/06/12  9:57 PM      Result Value Range   Glucose-Capillary 121 (*) 70 - 99 mg/dL   Comment 1 Notify RN    GLUCOSE, CAPILLARY     Status: Abnormal   Collection Time    07/07/12  6:33 AM      Result Value Range   Glucose-Capillary 100 (*) 70 - 99 mg/dL  BASIC METABOLIC PANEL     Status: Abnormal   Collection Time    07/07/12  8:26 AM      Result Value Range   Sodium 142  135 - 145 mEq/L   Potassium 3.5  3.5 - 5.1 mEq/L   Chloride 97  96 - 112 mEq/L   CO2 39 (*) 19 - 32 mEq/L   Glucose, Bld 149 (*) 70 - 99 mg/dL   BUN 21  6 - 23 mg/dL   Creatinine, Ser 8.65  0.50 - 1.35 mg/dL   Calcium 9.0  8.4 - 78.4 mg/dL   GFR calc non Af Amer 62 (*) >90 mL/min   GFR calc Af Amer 72 (*) >90 mL/min   Comment:            The eGFR has been calculated     using the CKD EPI equation.     This calculation has not been     validated in all clinical     situations.     eGFR's persistently     <90 mL/min signify     possible Chronic Kidney Disease.    Dg Chest 1 View  07/07/2012  *RADIOLOGY REPORT*  Clinical Data: Shortness of breath  CHEST - 1 VIEW  Comparison: Chest x-ray of 07/05/2012  Findings: The degree of pulmonary vascular congestion has improved. There is cardiomegaly present with a small left effusion remaining. No skeletal  abnormality is seen.  IMPRESSION: Some improvement in edema.  Small effusion remains on the left.   Original Report Authenticated By: Dwyane Dee, M.D.    Dg Chest 2 View  07/05/2012  *RADIOLOGY REPORT*  Clinical Data: Weakness.  CHEST - 2 VIEW  Comparison: 01/18/2012.  Findings: Cardiomegaly.  Pulmonary vascular congestion.  Tortuous thoracic aorta.  No pleural effusion.  Basilar atelectasis.  Lower lung volumes than prior. No alveolar edema.  IMPRESSION: Mild CHF.   Original Report Authenticated By: Andreas Newport, M.D.     Review of Systems  Constitutional: Negative for fever and diaphoresis.  HENT: Negative for congestion.   Respiratory: Positive for shortness of breath. Negative  for cough.   Cardiovascular: Positive for leg swelling. Negative for chest pain, orthopnea and PND.  Gastrointestinal: Negative for nausea, vomiting and abdominal pain.  Neurological: Negative for dizziness.   Blood pressure 87/54, pulse 118, temperature 97.7 F (36.5 C), temperature source Axillary, resp. rate 19, height 5\' 9"  (1.753 m), weight 107.2 kg (236 lb 5.3 oz), SpO2 96.00%. Physical Exam  Constitutional: He is oriented to person, place, and time. He appears well-developed. No distress.  Overweight  HENT:  Head: Normocephalic and atraumatic.  Eyes: EOM are normal. Pupils are equal, round, and reactive to light. No scleral icterus.  Neck: Normal range of motion. JVD present.  Cardiovascular: An irregularly irregular rhythm present. Tachycardia present.   No murmur heard. Pulses:      Radial pulses are 2+ on the right side, and 2+ on the left side.       Dorsalis pedis pulses are 1+ on the right side, and 1+ on the left side.       Posterior tibial pulses are 1+ on the right side, and 1+ on the left side.  No carotid bruits.  Respiratory: Effort normal and breath sounds normal. He has no rales.  GI: Soft. Bowel sounds are normal. He exhibits distension. There is no tenderness.  Musculoskeletal: He  exhibits edema.  Lymphadenopathy:    He has no cervical adenopathy.  Neurological: He is alert and oriented to person, place, and time. He exhibits normal muscle tone.  Skin: Skin is warm and dry.  Psychiatric: He has a normal mood and affect.    Assessment/Plan: Patient Active Problem List  Diagnosis  . HYPERLIPIDEMIA  . Type II or unspecified type diabetes mellitus with other specified manifestations, uncontrolled  . INSOMNIA, CHRONIC, MILD  . HYPERTENSION  . NASAL POLYP  . CHRONIC RHINITIS  . ASTHMA  . SEBORRHEA CAPITIS  . BENIGN PROSTATIC HYPERTROPHY  . Hx of colonic polyps  . Atrial fibrillation with RVR  . DJD (degenerative joint disease), lumbar  . Lower extremity edema  . Acute CHF  . General weakness   Plan:  2D echo is being completed right now.  The one from Sept was relatively normal.  He likely has some degree of diastolic dysfunction but is in Afib so it will be difficult to assess.  The patient's edema/JVD is likely related to Afib if the new echo is the same.  Weakness and fatigue is probably due to hypotension/afib.  He has been on Xarelto since Sept.  Recommend DCCV.  Net fluids: -3.8 L.   Will decrease Lasix drip and slow the diuresis with hopes of improving BP.  Will also hold Coreg for now.  Recheck BNP which was 2974 at admission.   Continue digoxin.    Collie Kittel 07/07/2012, 9:22 AM

## 2012-07-07 NOTE — Progress Notes (Signed)
Echocardiogram 2D Echocardiogram has been performed.  [name] 07/07/2012, 10:31 AM

## 2012-07-07 NOTE — Anesthesia Preprocedure Evaluation (Addendum)
Anesthesia Evaluation  Patient identified by MRN, date of birth, ID band Patient awake    Reviewed: Allergy & Precautions, H&P , NPO status , Patient's Chart, lab work & pertinent test results, reviewed documented beta blocker date and time   Airway Mallampati: II TM Distance: >3 FB Neck ROM: Full    Dental  (+) Lower Dentures and Upper Dentures   Pulmonary shortness of breath, asthma ,  breath sounds clear to auscultation        Cardiovascular hypertension, +CHF + dysrhythmias Atrial Fibrillation Rhythm:Irregular Rate:Normal     Neuro/Psych    GI/Hepatic GERD-  ,  Endo/Other  diabetes  Renal/GU      Musculoskeletal   Abdominal (+) + obese,   Peds  Hematology   Anesthesia Other Findings   Reproductive/Obstetrics                          Anesthesia Physical Anesthesia Plan  ASA: III  Anesthesia Plan: General   Post-op Pain Management:    Induction: Intravenous  Airway Management Planned: Mask  Additional Equipment:   Intra-op Plan:   Post-operative Plan:   Informed Consent: I have reviewed the patients History and Physical, chart, labs and discussed the procedure including the risks, benefits and alternatives for the proposed anesthesia with the patient or authorized representative who has indicated his/her understanding and acceptance.     Plan Discussed with: Surgeon and CRNA  Anesthesia Plan Comments:        Anesthesia Quick Evaluation

## 2012-07-07 NOTE — H&P (Signed)
     [facility]          INTERVAL PROCEDURE H&P   History and Physical Interval Note:  07/07/2012 2:49 PM  [name] has presented today for their planned procedure. The various methods of treatment have been discussed with the patient and family. After consideration of risks, benefits and other options for treatment, the patient has consented to the procedure.  The patients' outpatient history has been reviewed, patient examined, and no change in status from most recent office note within the past 30 days. I have reviewed the patients' chart and labs and will proceed as planned. Questions were answered to the patient's satisfaction.   Chrystie Nose, MD, [facility] Attending Cardiologist [facility]  HILTY,Kenneth C 07/07/2012, 2:49 PM

## 2012-07-07 NOTE — Progress Notes (Addendum)
Cardioversion planned today at 1500 in endoscopy.  Chrystie Nose, MD, [facility] Attending Cardiologist [facility]

## 2012-07-07 NOTE — Preoperative (Signed)
Beta Blockers   Reason not to administer Beta Blockers:Not Applicable 

## 2012-07-07 NOTE — Anesthesia Postprocedure Evaluation (Signed)
  Anesthesia Post-op Note  Patient: [name]  Procedure(s) Performed: Procedure(s): CARDIOVERSION (N/A)  Patient Location: PACU and Endoscopy Unit  Anesthesia Type:General  Level of Consciousness: awake  Airway and Oxygen Therapy: Patient Spontanous Breathing  Post-op Pain: none  Post-op Assessment: Post-op Vital signs reviewed, Patient's Cardiovascular Status Stable, Respiratory Function Stable, Patent Airway, No signs of Nausea or vomiting and Pain level controlled  Post-op Vital Signs: stable  Complications: No apparent anesthesia complications

## 2012-07-07 NOTE — Transfer of Care (Signed)
Immediate Anesthesia Transfer of Care Note  Patient: [name]  Procedure(s) Performed: Procedure(s): CARDIOVERSION (N/A)  Patient Location: Endoscopy Unit  Anesthesia Type:General  Level of Consciousness: awake, alert , oriented and patient cooperative  Airway & Oxygen Therapy: Patient Spontanous Breathing and Patient connected to nasal cannula oxygen  Post-op Assessment: Report given to PACU RN, Post -op Vital signs reviewed and stable and Patient moving all extremities  Post vital signs: Reviewed and stable  Complications: No apparent anesthesia complications

## 2012-07-07 NOTE — Progress Notes (Signed)
[facility] PROGRESS NOTE Interim History: 79 y.o. male with past medical history of hypertension, lower extremity edema and chronic kidney disease. Patient came to the hospital after he collapsed to the floor today secondary to a generalized weakness. Patient said for the past 2 weeks she's been getting weaker, having more shortness of breath with exertion, and most of all having worsening of his lower extremity edema. Patient has some cough but no significant sputum production, denies chest pain, palpitations or orthopnea. Upon initial evaluation in the emergency department chest x-ray showed mild CHF and his lower extremity showed massive edema. His BUN is 42 and creatinine is 1.3. Has BNP of 2900, previously was about 475. Weight 108 kg  Assessment/Plan:  General weakness due to Acute CHF - Lasix drip. Good diuresis about 3.0L,  - Bp continue to decrease. + JVD crackles at bases. Consult cardiology. - Cardiac enzymes - x3. EKG atrial fibrillation. - Echo pending 2.20.2014  Aib with RVR: - most likely due to ADHF. Monitor on telemetry. - continue Xarelto. - continue Beta blocker coreg. IV PRN metoprolol. - add digoxin.    Type II or unspecified type diabetes mellitus with other specified manifestations, uncontrolled: - blood glucose well controlled. - cont SSI. HbgA1c 6.5   HYPERLIPIDEMIA - statins.   HYPERTENSION - borderline low, continue coreg.   Code Status: Full code  Family Communication: Discussed with the patient and his son daughter are at bedside.  Disposition Plan: Telemetry, inpatient, and anticipate length of stay to be greater than 2 midnights.  Consultants:  none  Procedures:  Echo 2.201.2014:  Antibiotics:  none (indicate start date, and stop date if known)  HPI/Subjective: - Relates his SOB is improved. - was able to sleep.  Objective: Filed Vitals:   07/06/12 1808 07/06/12 2100 07/07/12 0407 07/07/12 0649  BP: 82/48 91/49 87/54    Pulse:   104 118   Temp:  98.6 F (37 C) 97.7 F (36.5 C)   TempSrc:  Oral Axillary   Resp:  18 19   Height:      Weight:    107.2 kg (236 lb 5.3 oz)  SpO2:  97% 96%     Intake/Output Summary (Last 24 hours) at 07/07/12 1610 Last data filed at 07/07/12 0649  Gross per 24 hour  Intake    700 ml  Output   2925 ml  Net  -2225 ml   Filed Weights   07/05/12 1637 07/06/12 0531 07/07/12 0649  Weight: 108.5 kg (239 lb 3.2 oz) 108 kg (238 lb 1.6 oz) 107.2 kg (236 lb 5.3 oz)    Exam:  General: Alert, awake, oriented x3, in no acute distress.  HEENT: No bruits, no goiter. Still +JVD Heart: IRegular rate and rhythm, without murmurs, rubs, gallops.  Lungs: Good air movement, crackles B/L  Abdomen: Soft, nontender, nondistended, positive bowel sounds.  Neuro: Grossly intact, nonfocal.   Data Reviewed: Basic Metabolic Panel:  Recent Labs Lab 07/05/12 1036 07/06/12 0440  NA 139 141  K 3.8 3.3*  CL 95* 96  CO2 36* 38*  GLUCOSE 114* 92  BUN 42* 31*  CREATININE 1.30 1.09  CALCIUM 9.5 9.0   Liver Function Tests:  Recent Labs Lab 07/05/12 1036  AST 16  ALT 10  ALKPHOS 77  BILITOT 0.4  PROT 6.7  ALBUMIN 3.6   No results found for this basename: LIPASE, AMYLASE,  in the last 168 hours No results found for this basename: AMMONIA,  in the last 168 hours CBC:  Recent Labs Lab 07/05/12 1036 07/06/12 0440  WBC 6.8 4.9  NEUTROABS 3.7  --   HGB 10.3* 9.3*  HCT 31.8* 28.9*  MCV 90.3 89.8  PLT 229 210   Cardiac Enzymes:  Recent Labs Lab 07/06/12 0936 07/06/12 1545 07/06/12 2112  TROPONINI <0.30 <0.30 <0.30   BNP (last 3 results)  Recent Labs  01/23/12 1100 02/28/12 0941 07/05/12 1102  PROBNP 1194.0* 475.0* 2974.0*   CBG:  Recent Labs Lab 07/05/12 1610 07/05/12 2106 07/06/12 0548 07/06/12 2157 07/07/12 0633  GLUCAP 106* 149* 89 121* 100*    Recent Results (from the past 240 hour(s))  URINE CULTURE     Status: None   Collection Time    07/05/12  8:26  PM      Result Value Range Status   Specimen Description URINE, RANDOM   Final   Special Requests NONE   Final   Culture  Setup Time 07/05/2012 21:28   Final   Colony Count >=100,000 COLONIES/ML   Final   Culture     Final   Value: Multiple bacterial morphotypes present, none predominant. Suggest appropriate recollection if clinically indicated.   Report Status 07/06/2012 FINAL   Final     Studies: Dg Chest 1 View  07/07/2012  *RADIOLOGY REPORT*  Clinical Data: Shortness of breath  CHEST - 1 VIEW  Comparison: Chest x-ray of 07/05/2012  Findings: The degree of pulmonary vascular congestion has improved. There is cardiomegaly present with a small left effusion remaining. No skeletal abnormality is seen.  IMPRESSION: Some improvement in edema.  Small effusion remains on the left.   Original Report Authenticated By: Dwyane Dee, M.D.    Dg Chest 2 View  07/05/2012  *RADIOLOGY REPORT*  Clinical Data: Weakness.  CHEST - 2 VIEW  Comparison: 01/18/2012.  Findings: Cardiomegaly.  Pulmonary vascular congestion.  Tortuous thoracic aorta.  No pleural effusion.  Basilar atelectasis.  Lower lung volumes than prior. No alveolar edema.  IMPRESSION: Mild CHF.   Original Report Authenticated By: Andreas Newport, M.D.     Scheduled Meds: . carvedilol  3.125 mg Oral BID WC  . digoxin  0.125 mg Oral Daily  . gabapentin  600 mg Oral TID  . insulin aspart  0-9 Units Subcutaneous TID WC  . pantoprazole  40 mg Oral Q breakfast  . polyethylene glycol  17 g Oral Daily  . Rivaroxaban  20 mg Oral Q supper  . sodium chloride  3 mL Intravenous Q12H  . Tamsulosin HCl  0.4 mg Oral QPC breakfast  . tiotropium  18 mcg Inhalation Daily   Continuous Infusions: . furosemide (LASIX) infusion 8 mg/hr (07/06/12 1143)     Radonna Ricker Rosine Beat  [facility] Pager [phone].  If 8PM-8AM, please contact night-coverage at [email or web address], password [facility] 07/07/2012, 8:22 AM  LOS: 2 days

## 2012-07-07 NOTE — Anesthesia Postprocedure Evaluation (Signed)
  Anesthesia Post-op Note  Patient: [name]  Procedure(s) Performed: Procedure(s): CARDIOVERSION (N/A)  Patient Location: Endoscopy Unit  Anesthesia Type:General  Level of Consciousness: awake, alert , oriented and patient cooperative  Airway and Oxygen Therapy: Patient Spontanous Breathing and Patient connected to nasal cannula oxygen  Post-op Pain: none  Post-op Assessment: Post-op Vital signs reviewed, Patient's Cardiovascular Status Stable, Respiratory Function Stable, Patent Airway and No signs of Nausea or vomiting  Post-op Vital Signs: Reviewed and stable  Complications: No apparent anesthesia complications

## 2012-07-08 DIAGNOSIS — I5021 Acute systolic (congestive) heart failure: Secondary | ICD-10-CM

## 2012-07-08 LAB — BASIC METABOLIC PANEL
BUN: 16 mg/dL (ref 6–23)
CO2: 39 mEq/L — ABNORMAL HIGH (ref 19–32)
Calcium: 8.4 mg/dL (ref 8.4–10.5)
Chloride: 97 mEq/L (ref 96–112)
Creatinine, Ser: 0.94 mg/dL (ref 0.50–1.35)
GFR calc Af Amer: 87 mL/min — ABNORMAL LOW (ref >90)
GFR calc non Af Amer: 75 mL/min — ABNORMAL LOW (ref >90)
Glucose, Bld: 102 mg/dL — ABNORMAL HIGH (ref 70–99)
Potassium: 3.2 mEq/L — ABNORMAL LOW (ref 3.5–5.1)
Sodium: 142 mEq/L (ref 135–145)

## 2012-07-08 LAB — GLUCOSE, CAPILLARY
Glucose-Capillary: 101 mg/dL — ABNORMAL HIGH (ref 70–99)
Glucose-Capillary: 158 mg/dL — ABNORMAL HIGH (ref 70–99)
Glucose-Capillary: 157 mg/dL — ABNORMAL HIGH (ref 70–99)

## 2012-07-08 MED ORDER — MAGNESIUM CITRATE PO SOLN
300.0000 mL | Freq: Once | ORAL | Status: AC
  Administered 2012-07-08: 300 mL via ORAL
  Filled 2012-07-08 (×2): qty 592

## 2012-07-08 MED ORDER — POTASSIUM CHLORIDE CRYS ER 20 MEQ PO TBCR
40.0000 meq | EXTENDED_RELEASE_TABLET | Freq: Two times a day (BID) | ORAL | Status: AC
  Administered 2012-07-08 (×2): 40 meq via ORAL
  Filled 2012-07-08: qty 2

## 2012-07-08 MED ORDER — BIOTENE DRY MOUTH MT LIQD
15.0000 mL | Freq: Two times a day (BID) | OROMUCOSAL | Status: DC
  Administered 2012-07-08 – 2012-07-14 (×11): 15 mL via OROMUCOSAL

## 2012-07-08 NOTE — Progress Notes (Signed)
Subjective:  Still very weak; takes maximal effort to transfer to [facility]. Breathing is not as bad as before. Unsuccessful TEE/DCCV yesterday - started on Amiodarone.  Objective:  Vital Signs in the last 24 hours: Temp:  [98.8 F (37.1 C)-100 F (37.8 C)] 98.8 F (37.1 C) (02/22 0514) Pulse Rate:  [102-105] 105 (02/22 1115) Resp:  [20-29] 24 (02/22 1115) BP: (83-101)/(44-74) 94/48 mmHg (02/22 1115) SpO2:  [92 %-98 %] 96 % (02/22 1115) Weight:  [106.2 kg (234 lb 2.1 oz)] 106.2 kg (234 lb 2.1 oz) (02/22 0514)  Intake/Output from previous day: 02/21 0701 - 02/22 0700 In: 923 [P.O.:820; I.V.:103] Out: 1800 [Urine:1800] Intake/Output from this shift: Total I/O In: 580 [P.O.:580] Out: -  Not recoreded ~13-1400 ml in foley bag.   Physical Exam: General appearance: alert, cooperative, fatigued and moderately obese Neck: JVD - 10 cm above sternal notch, no adenopathy, no carotid bruit, supple, symmetrical, trachea midline and thyroid not enlarged, symmetric, no tenderness/mass/nodules Lungs: diminished breath sounds bibasilar, rales bibasilar and non-labored Heart: irregularly irregular rhythm, S1, S2 normal and rate improved; no obvious M/R/G Abdomen: abnormal findings:  protuberant, tympanytic + ascites; unable to palpate HSM due to protuberant abdomen; does not appear to be totally due to obesity. Extremities: edema 3+ to thighs Pulses: diminished pedal pulses due to edema Neurologic: Grossly normal  Lab Results:  Recent Labs  07/06/12 0440  WBC 4.9  HGB 9.3*  PLT 210    Recent Labs  07/07/12 0826 07/08/12 0540  NA 142 142  K 3.5 3.2*  CL 97 97  CO2 39* 39*  GLUCOSE 149* 102*  BUN 21 16  CREATININE 1.07 0.94    Recent Labs  07/06/12 1545 07/06/12 2112  TROPONINI <0.30 <0.30   Imaging: Imaging results have been reviewed -- CXR with improved pulmonary edema /congestion  Cardiac Studies:   Echo: Study Conclusions: - Left ventricle cavity size -- upper  limits of; EF ~ 40%. Unable to assess diastolic function with Afib; Aortic Sclerosis w/o stenosis; Mild MR.  Mod-Severe LA dilation; mild RV/RA dilation; PA P - moderately elevated.   Assessment/Plan:  Principal Problem:   Acute systolic CHF (congestive heart failure), NYHA class 3 -- Unclear etilogy (? Afib related) EF down from 60-70% to 40%. Active Problems:   HYPERTENSION   Paroxysmal Atrial fibrillation - admitted with RVR   Lower extremity edema   General weakness   HYPERLIPIDEMIA   Type II or unspecified type diabetes mellitus with other specified manifestations, uncontrolled  Major concerns  Atrial Fibrillation Cardiomyopathy with Combined Systolic & Diastolic CHF complicated by Afib- appears to have BiV failure with Pulmonary Vascular & peripheral edema  EF is down -- likely etiology is pAF (tachycardia related) & would be nice to recheck once NSR restored.    Would need ischemic evaluation - but this can wait until HF has improved; he denies any Sx c/w Angina.  Rate has improved, but remains in Afib on Amiodarone & Digoxin.  Can stop BB for now, with hypotension & decompensated HF.  Remains of Xarelto -- would consider attempt at DCCV after Amiodarone loading  With low BP - unable to initiate afterload reducing agents.  Will continue Lasix gtt at current rate -- goal I/O `1-1.5L/day.  At this rate, will be at least 4-6 days of diuresis.  Has had steady diuresis; Wgt down from 108.5 to 106.2 kg.   On SSI for DM-2 No BM x 3 days with tympanytic abdomen -- bowel regimen;  if no BM tomorrow, will check KUB OOB-chair; PT/OT. On statin -- will check LFTs. ? Passive congestive hepatopathy; unable to feel liver edge on exam.    LOS: 3 days    Marisel Tostenson W 07/08/2012, 12:00 PM

## 2012-07-08 NOTE — Progress Notes (Addendum)
[facility] PROGRESS NOTE Interim History: 79 y.o. male with past medical history of hypertension, lower extremity edema and chronic kidney disease. Patient came to the hospital after he collapsed to the floor today secondary to a generalized weakness. Patient said for the past 2 weeks she's been getting weaker, having more shortness of breath with exertion, and most of all having worsening of his lower extremity edema. Patient has some cough but no significant sputum production, denies chest pain, palpitations or orthopnea. Upon initial evaluation in the emergency department chest x-ray showed mild CHF and his lower extremity showed massive edema. His BUN is 42 and creatinine is 1.3. Has BNP of 2900, previously was about 475. Weight 108 kg  Assessment/Plan:  General weakness due to Acute systolic biventricular HF - Lasix drip. Good diuresis about 0.8L , ? Non-ischemic cardiomyopathy. - Bp continue to be low. + JVD crackles at bases.  - Echo 2.20.2014: ejection fraction was 40%. The study is not technically sufficient to allow evaluation of LV diastolic function. Cardioversion unsuccessfull.  - mild diuresis yesterday, hold coreg started on amio. - replete electrolytes as needed. Filed Weights   07/06/12 0531 07/07/12 0649 07/08/12 0514  Weight: 108 kg (238 lb 1.6 oz) 107.2 kg (236 lb 5.3 oz) 106.2 kg (234 lb 2.1 oz)    Aib with RVR: - Monitor on telemetry. Still midly tachycardic. - continue Xarelto. - D/c coreg - cont. Digoxin and amio.    Type II or unspecified type diabetes mellitus with other specified manifestations, uncontrolled: - blood glucose well controlled. - cont SSI. HbgA1c 6.5   HYPERLIPIDEMIA - statins.   HYPERTENSION - borderline low, continue coreg.   Code Status: Full code  Family Communication: Discussed with the patient and his son daughter are at bedside.  Disposition Plan: Telemetry, inpatient, and anticipate length of stay to be greater than 2  midnights.  Consultants:  none  Procedures:  Echo 2.201.2014:The study is not technically sufficient to allow evaluation of LV diastolic function. Cardioversion unsuccessfull.   Antibiotics:  none (indicate start date, and stop date if known)  HPI/Subjective: - was able to sleep. - thirsty  Objective: Filed Vitals:   07/07/12 1600 07/07/12 1610 07/07/12 1900 07/08/12 0514  BP: 94/61 100/74 100/50 99/44  Pulse:    102  Temp:   99 F (37.2 C) 98.8 F (37.1 C)  TempSrc:   Axillary Axillary  Resp: 29 22 21 22   Height:      Weight:    106.2 kg (234 lb 2.1 oz)  SpO2: 92% 94% 98% 96%    Intake/Output Summary (Last 24 hours) at 07/08/12 1038 Last data filed at 07/08/12 0900  Gross per 24 hour  Intake   1043 ml  Output   1450 ml  Net   -407 ml   Filed Weights   07/06/12 0531 07/07/12 0649 07/08/12 0514  Weight: 108 kg (238 lb 1.6 oz) 107.2 kg (236 lb 5.3 oz) 106.2 kg (234 lb 2.1 oz)    Exam:  General: Alert, awake, oriented x3, in no acute distress.  HEENT: No bruits, no goiter. Still +JVD Heart: IRegular rate and rhythm, without murmurs, rubs, gallops.  Lungs: Good air movement, crackles B/L  Abdomen: Soft, nontender, nondistended, positive bowel sounds.  Neuro: Grossly intact, nonfocal.   Data Reviewed: Basic Metabolic Panel:  Recent Labs Lab 07/05/12 1036 07/06/12 0440 07/07/12 0826 07/08/12 0540  NA 139 141 142 142  K 3.8 3.3* 3.5 3.2*  CL 95* 96 97 97  CO2 36* 38* 39* 39*  GLUCOSE 114* 92 149* 102*  BUN 42* 31* 21 16  CREATININE 1.30 1.09 1.07 0.94  CALCIUM 9.5 9.0 9.0 8.4   Liver Function Tests:  Recent Labs Lab 07/05/12 1036  AST 16  ALT 10  ALKPHOS 77  BILITOT 0.4  PROT 6.7  ALBUMIN 3.6   No results found for this basename: LIPASE, AMYLASE,  in the last 168 hours No results found for this basename: AMMONIA,  in the last 168 hours CBC:  Recent Labs Lab 07/05/12 1036 07/06/12 0440  WBC 6.8 4.9  NEUTROABS 3.7  --   HGB 10.3*  9.3*  HCT 31.8* 28.9*  MCV 90.3 89.8  PLT 229 210   Cardiac Enzymes:  Recent Labs Lab 07/06/12 0936 07/06/12 1545 07/06/12 2112  TROPONINI <0.30 <0.30 <0.30   BNP (last 3 results)  Recent Labs  02/28/12 0941 07/05/12 1102 07/07/12 0826  PROBNP 475.0* 2974.0* 3724.0*   CBG:  Recent Labs Lab 07/05/12 2106 07/06/12 0548 07/06/12 2157 07/07/12 0633 07/08/12 0631  GLUCAP 149* 89 121* 100* 101*    Recent Results (from the past 240 hour(s))  URINE CULTURE     Status: None   Collection Time    07/05/12  8:26 PM      Result Value Range Status   Specimen Description URINE, RANDOM   Final   Special Requests NONE   Final   Culture  Setup Time 07/05/2012 21:28   Final   Colony Count >=100,000 COLONIES/ML   Final   Culture     Final   Value: Multiple bacterial morphotypes present, none predominant. Suggest appropriate recollection if clinically indicated.   Report Status 07/06/2012 FINAL   Final     Studies: Dg Chest 1 View  07/07/2012  *RADIOLOGY REPORT*  Clinical Data: Shortness of breath  CHEST - 1 VIEW  Comparison: Chest x-ray of 07/05/2012  Findings: The degree of pulmonary vascular congestion has improved. There is cardiomegaly present with a small left effusion remaining. No skeletal abnormality is seen.  IMPRESSION: Some improvement in edema.  Small effusion remains on the left.   Original Report Authenticated By: Dwyane Dee, M.D.     Scheduled Meds: . amiodarone  400 mg Oral BID  . carvedilol  3.125 mg Oral BID WC  . digoxin  0.25 mg Oral Daily  . gabapentin  600 mg Oral TID  . insulin aspart  0-9 Units Subcutaneous TID WC  . pantoprazole  40 mg Oral Q breakfast  . polyethylene glycol  17 g Oral Daily  . Rivaroxaban  20 mg Oral Q supper  . sodium chloride  3 mL Intravenous Q12H  . Tamsulosin HCl  0.4 mg Oral QPC breakfast  . tiotropium  18 mcg Inhalation Daily   Continuous Infusions: . sodium chloride 10 mL/hr (07/07/12 1735)  . furosemide (LASIX)  infusion 4 mg/hr (07/08/12 0625)     Marinda Elk  [facility] Pager [phone].  If 8PM-8AM, please contact night-coverage at [email or web address], password [facility] 07/08/2012, 10:38 AM  LOS: 3 days

## 2012-07-09 LAB — COMPREHENSIVE METABOLIC PANEL
BUN: 14 mg/dL (ref 6–23)
Calcium: 9.1 mg/dL (ref 8.4–10.5)
Creatinine, Ser: 0.86 mg/dL (ref 0.50–1.35)
GFR calc Af Amer: >90 mL/min (ref >90)
Glucose, Bld: 109 mg/dL — ABNORMAL HIGH (ref 70–99)
Total Protein: 6.2 g/dL (ref 6.0–8.3)

## 2012-07-09 LAB — URINALYSIS, ROUTINE W REFLEX MICROSCOPIC
Glucose, UA: NEGATIVE mg/dL
Ketones, ur: NEGATIVE mg/dL
Protein, ur: NEGATIVE mg/dL
Urobilinogen, UA: 0.2 mg/dL (ref 0.0–1.0)

## 2012-07-09 LAB — GLUCOSE, CAPILLARY
Glucose-Capillary: 119 mg/dL — ABNORMAL HIGH (ref 70–99)
Glucose-Capillary: 111 mg/dL — ABNORMAL HIGH (ref 70–99)
Glucose-Capillary: 136 mg/dL — ABNORMAL HIGH (ref 70–99)

## 2012-07-09 LAB — URINE MICROSCOPIC-ADD ON

## 2012-07-09 MED ORDER — AMIODARONE IV BOLUS ONLY 150 MG/100ML
150.0000 mg | Freq: Once | INTRAVENOUS | Status: AC
  Administered 2012-07-09: 150 mg via INTRAVENOUS
  Filled 2012-07-09: qty 100

## 2012-07-09 NOTE — Progress Notes (Signed)
Pts. Heart rate elevated to 150s not sustained.  Pt. Ambulating with assistance to the bedside commode.  Pt. Asymptomatic.  Will continue to monitor.

## 2012-07-09 NOTE — Progress Notes (Signed)
[facility]  Subjective: No Complaints.  Objective: Vital signs in last 24 hours: Temp:  [98.9 F (37.2 C)-100.8 F (38.2 C)] 100.8 F (38.2 C) (02/23 0514) Pulse Rate:  [100-118] 118 (02/23 1100) Resp:  [18-24] 18 (02/23 0514) BP: (94-116)/(47-52) 116/52 mmHg (02/23 0514) SpO2:  [90 %-100 %] 99 % (02/23 0746) Weight:  [107.2 kg (236 lb 5.3 oz)] 107.2 kg (236 lb 5.3 oz) (02/23 0514) Last BM Date: 07/08/12  Intake/Output from previous day: 02/22 0701 - 02/23 0700 In: 1881.9 [P.O.:1720; I.V.:161.9] Out: 4000 [Urine:4000] Intake/Output this shift:    Medications Current Facility-Administered Medications  Medication Dose Route Frequency Provider Last Rate Last Dose  . 0.9 %  sodium chloride infusion   Intravenous Continuous Wilburt Finlay, PA 10 mL/hr at 07/07/12 1735 10 mL/hr at 07/07/12 1735  . acetaminophen (TYLENOL) tablet 500 mg  500 mg Oral Q8H PRN Clydia Llano, MD   500 mg at 07/06/12 1653  . amiodarone (PACERONE) tablet 400 mg  400 mg Oral BID Wilburt Finlay, PA   400 mg at 07/09/12 1102  . antiseptic oral rinse (BIOTENE) solution 15 mL  15 mL Mouth Rinse BID Chrystie Nose, MD   15 mL at 07/09/12 1104  . digoxin (LANOXIN) tablet 0.25 mg  0.25 mg Oral Daily Marinda Elk, MD   0.25 mg at 07/09/12 1100  . furosemide (LASIX) 250 mg in dextrose 5 % 250 mL infusion  4 mg/hr Intravenous Continuous Wilburt Finlay, PA 4 mL/hr at 07/08/12 0625 4 mg/hr at 07/08/12 0625  . gabapentin (NEURONTIN) capsule 600 mg  600 mg Oral TID Clydia Llano, MD   600 mg at 07/09/12 1100  . insulin aspart (novoLOG) injection 0-9 Units  0-9 Units Subcutaneous TID WC Clydia Llano, MD   2 Units at 07/08/12 1816  . metoprolol (LOPRESSOR) injection 10 mg  10 mg Intravenous Q6H PRN Marinda Elk, MD   10 mg at 07/07/12 1117  . ondansetron (ZOFRAN) tablet 4 mg  4 mg Oral Q6H PRN Clydia Llano, MD       Or  . ondansetron (ZOFRAN) injection 4 mg  4 mg Intravenous Q6H PRN  Clydia Llano, MD      . pantoprazole (PROTONIX) EC tablet 40 mg  40 mg Oral Q breakfast Clydia Llano, MD   40 mg at 07/09/12 0619  . polyethylene glycol (MIRALAX / GLYCOLAX) packet 17 g  17 g Oral Daily Clydia Llano, MD   17 g at 07/09/12 1103  . Rivaroxaban (XARELTO) tablet 20 mg  20 mg Oral Q supper Clydia Llano, MD   20 mg at 07/08/12 1815  . senna (SENOKOT) tablet 17.2 mg  2 tablet Oral Daily PRN Leda Gauze, NP   17.2 mg at 07/05/12 2252  . sodium chloride 0.9 % injection 3 mL  3 mL Intravenous Q12H Clydia Llano, MD   3 mL at 07/09/12 1107  . Tamsulosin HCl (FLOMAX) capsule 0.4 mg  0.4 mg Oral QPC breakfast Clydia Llano, MD   0.4 mg at 07/09/12 1100  . tiotropium (SPIRIVA) inhalation capsule 18 mcg  18 mcg Inhalation Daily Clydia Llano, MD   18 mcg at 07/09/12 0746  . traMADol (ULTRAM) tablet 50 mg  50 mg Oral Q8H PRN Clydia Llano, MD   50 mg at 07/06/12 1540  . zolpidem (AMBIEN) tablet 5 mg  5 mg Oral QHS PRN Clydia Llano, MD   5 mg at 07/08/12 2159    PE: General  appearance: alert, cooperative and no distress Lungs: Decreased effort/BS.  No Wheeze or rhonchi.   Heart: irregularly irregular rhythm and 16 Sys MM Abdomen: soft, nontender, +BS Extremities: 3+ LEE Pulses: 2+ and symmetric Skin: warm and dry. Neurologic: Grossly normal  Lab Results:  No results found for this basename: WBC, HGB, HCT, PLT,  in the last 72 hours BMET  Recent Labs  07/07/12 0826 07/08/12 0540 07/09/12 0520  NA 142 142 143  K 3.5 3.2* 3.8  CL 97 97 98  CO2 39* 39* 39*  GLUCOSE 149* 102* 109*  BUN 21 16 14   CREATININE 1.07 0.94 0.86  CALCIUM 9.0 8.4 9.1    Assessment/Plan   Principal Problem:   Acute systolic CHF (congestive heart failure), NYHA class 3 -- Unclear etilogy (? Afib related) EF down from 60-70% to 40%. Active Problems:   HYPERLIPIDEMIA   Type II or unspecified type diabetes mellitus with other specified manifestations, uncontrolled   HYPERTENSION   Paroxysmal Atrial  fibrillation - admitted with RVR   Lower extremity edema   General weakness  Plan:  S/P failed attempt at DCCV.  Currently in AFIB at 106BMP.  Loading on PO amiodarone now.  Also on Digoxin.  K+ improved.  Low grade temp: 100.8. It was rechecked and 98.7.  No complaints.  BP stable.  On a lasix drip at 4mg /hour.  Net fluids: -2.1L/24hrs.  -6.7 since admission.  Continue current dosing.  O2 sats drop to 87% when oxygen is turned off.  PT/OT evals pending.    LOS: 4 days    [name] 07/09/2012 11:09 AM

## 2012-07-09 NOTE — Progress Notes (Signed)
I have seen and evaluated the patient this AM along with Wilburt Finlay, PA. I agree with his findings, examination as well as impression recommendations.  Low grade temp this AM - will recheck UA to ensure no Catheter related UTI.  This may be driving his HR up a bit.     BB held to allow for increased renal perfusion & diuresis did pick up from yesterday.  Will watch one more day, then restart Carvedilol.  Continuing Amiodarone PO Load + Digoxin.  => ?? With ascites if PO fully absorbed.  Will give IV Bolus 150mg  of Amiodarone to help with load (not full 24 hr loading regimen to avoid XS volume).  Remains of Xarelto for anticoagulation.  COPD meds.  PPI  Will address code status -- was reportedly DNR last admission.  Marykay Lex, M.D., M.S. [facility] [address] [place], [place]  [address]  [phone] Pager # [phone] 07/09/2012 11:45 AM

## 2012-07-10 ENCOUNTER — Encounter (HOSPITAL_COMMUNITY): Payer: Self-pay | Admitting: Internal Medicine

## 2012-07-10 DIAGNOSIS — IMO0002 Reserved for concepts with insufficient information to code with codable children: Secondary | ICD-10-CM

## 2012-07-10 DIAGNOSIS — M4800 Spinal stenosis, site unspecified: Secondary | ICD-10-CM

## 2012-07-10 DIAGNOSIS — E119 Type 2 diabetes mellitus without complications: Secondary | ICD-10-CM

## 2012-07-10 DIAGNOSIS — IMO0001 Reserved for inherently not codable concepts without codable children: Secondary | ICD-10-CM

## 2012-07-10 DIAGNOSIS — M1710 Unilateral primary osteoarthritis, unspecified knee: Secondary | ICD-10-CM

## 2012-07-10 DIAGNOSIS — I509 Heart failure, unspecified: Secondary | ICD-10-CM

## 2012-07-10 DIAGNOSIS — G609 Hereditary and idiopathic neuropathy, unspecified: Secondary | ICD-10-CM

## 2012-07-10 LAB — BASIC METABOLIC PANEL
Chloride: 96 mEq/L (ref 96–112)
GFR calc Af Amer: >90 mL/min (ref >90)
GFR calc non Af Amer: 82 mL/min — ABNORMAL LOW (ref >90)
Glucose, Bld: 101 mg/dL — ABNORMAL HIGH (ref 70–99)
Potassium: 3.3 mEq/L — ABNORMAL LOW (ref 3.5–5.1)
Sodium: 139 mEq/L (ref 135–145)

## 2012-07-10 LAB — GLUCOSE, CAPILLARY: Glucose-Capillary: 117 mg/dL — ABNORMAL HIGH (ref 70–99)

## 2012-07-10 LAB — PRO B NATRIURETIC PEPTIDE: Pro B Natriuretic peptide (BNP): 4013 pg/mL — ABNORMAL HIGH (ref 0–450)

## 2012-07-10 MED ORDER — POTASSIUM CHLORIDE CRYS ER 20 MEQ PO TBCR
40.0000 meq | EXTENDED_RELEASE_TABLET | Freq: Once | ORAL | Status: AC
  Administered 2012-07-10: 40 meq via ORAL
  Filled 2012-07-10: qty 2

## 2012-07-10 NOTE — Progress Notes (Signed)
[facility]  Subjective: Doing well. Sitting in chair  Objective: Vital signs in last 24 hours: Temp:  [98.3 F (36.8 C)-98.6 F (37 C)] 98.4 F (36.9 C) (02/24 0626) Pulse Rate:  [95-118] 95 (02/24 0626) Resp:  [16-20] 16 (02/24 0626) BP: (84-103)/(48-59) 103/48 mmHg (02/24 0626) SpO2:  [87 %-98 %] 97 % (02/24 0626) Weight:  [104.237 kg (229 lb 12.8 oz)] 104.237 kg (229 lb 12.8 oz) (02/24 0626) Last BM Date: 07/10/12  Intake/Output from previous day: 02/23 0701 - 02/24 0700 In: 1309.8 [P.O.:1260; I.V.:49.8] Out: 4402 [Urine:4400; Stool:2] Intake/Output this shift: Total I/O In: 240 [P.O.:240] Out: -   Medications Current Facility-Administered Medications  Medication Dose Route Frequency Provider Last Rate Last Dose  . 0.9 %  sodium chloride infusion   Intravenous Continuous Wilburt Finlay, PA 10 mL/hr at 07/07/12 1735 10 mL/hr at 07/07/12 1735  . acetaminophen (TYLENOL) tablet 500 mg  500 mg Oral Q8H PRN Clydia Llano, MD   500 mg at 07/06/12 1653  . amiodarone (PACERONE) tablet 400 mg  400 mg Oral BID Wilburt Finlay, PA   400 mg at 07/09/12 2150  . antiseptic oral rinse (BIOTENE) solution 15 mL  15 mL Mouth Rinse BID Chrystie Nose, MD   15 mL at 07/09/12 2000  . digoxin (LANOXIN) tablet 0.25 mg  0.25 mg Oral Daily Marinda Elk, MD   0.25 mg at 07/09/12 1100  . furosemide (LASIX) 250 mg in dextrose 5 % 250 mL infusion  4 mg/hr Intravenous Continuous Wilburt Finlay, PA 4 mL/hr at 07/08/12 0625 4 mg/hr at 07/08/12 0625  . gabapentin (NEURONTIN) capsule 600 mg  600 mg Oral TID Clydia Llano, MD   600 mg at 07/09/12 2150  . insulin aspart (novoLOG) injection 0-9 Units  0-9 Units Subcutaneous TID WC Clydia Llano, MD   1 Units at 07/09/12 1721  . metoprolol (LOPRESSOR) injection 10 mg  10 mg Intravenous Q6H PRN Marinda Elk, MD   10 mg at 07/07/12 1117  . ondansetron (ZOFRAN) tablet 4 mg  4 mg Oral Q6H PRN Clydia Llano, MD       Or  .  ondansetron (ZOFRAN) injection 4 mg  4 mg Intravenous Q6H PRN Clydia Llano, MD      . pantoprazole (PROTONIX) EC tablet 40 mg  40 mg Oral Q breakfast Clydia Llano, MD   40 mg at 07/10/12 0639  . polyethylene glycol (MIRALAX / GLYCOLAX) packet 17 g  17 g Oral Daily Clydia Llano, MD   17 g at 07/09/12 1103  . Rivaroxaban (XARELTO) tablet 20 mg  20 mg Oral Q supper Clydia Llano, MD   20 mg at 07/09/12 1720  . senna (SENOKOT) tablet 17.2 mg  2 tablet Oral Daily PRN Leda Gauze, NP   17.2 mg at 07/05/12 2252  . sodium chloride 0.9 % injection 3 mL  3 mL Intravenous Q12H Clydia Llano, MD   3 mL at 07/09/12 2151  . Tamsulosin HCl (FLOMAX) capsule 0.4 mg  0.4 mg Oral QPC breakfast Clydia Llano, MD   0.4 mg at 07/09/12 1100  . tiotropium (SPIRIVA) inhalation capsule 18 mcg  18 mcg Inhalation Daily Clydia Llano, MD   18 mcg at 07/09/12 0746  . traMADol (ULTRAM) tablet 50 mg  50 mg Oral Q8H PRN Clydia Llano, MD   50 mg at 07/06/12 1540  . zolpidem (AMBIEN) tablet 5 mg  5 mg Oral QHS PRN Clydia Llano, MD  5 mg at 07/08/12 2159    PE: General appearance: alert, cooperative and no distress  Lungs: Decreased effort/BS. No Wheeze or rhonchi.  Heart: irregularly irregular rhythm and 1/6 Sys MM  Abdomen: soft, nontender, +BS  Extremities: 3+ LEE  Pulses: 2+ and symmetric  Skin: warm and dry.  Neurologic: Grossly normal   Lab Results:  No results found for this basename: WBC, HGB, HCT, PLT,  in the last 72 hours BMET  Recent Labs  07/08/12 0540 07/09/12 0520 07/10/12 0502  NA 142 143 139  K 3.2* 3.8 3.3*  CL 97 98 96  CO2 39* 39* 40*  GLUCOSE 102* 109* 101*  BUN 16 14 12   CREATININE 0.94 0.86 0.76  CALCIUM 8.4 9.1 8.8    Assessment/Plan  Principal Problem:   Acute systolic CHF (congestive heart failure), NYHA class 3 -- Unclear etilogy (? Afib related) EF down from 60-70% to 40%. Active Problems:   HYPERLIPIDEMIA   Type II or unspecified type diabetes mellitus with other  specified manifestations, uncontrolled   HYPERTENSION   Paroxysmal Atrial fibrillation - admitted with RVR   Lower extremity edema   General weakness  Plan:    1. Continues on Lasix drip.  Net fluids: -3.0L last 24hrs.   Stable SCr.  Continue current dose.  He still has a ways to go.  2.  Afib:  Loading on PO amio.  Was given 150mg  bolus yesterday.  HR 90-110.  Will consider repeat attempt at DCCV  3.  Replace K+ this AM.  4.  Hypotension:  May need to hold off on restarting Coreg.     LOS: 5 days    Sharonica Kraszewski 07/10/2012 10:13 AM

## 2012-07-10 NOTE — Progress Notes (Signed)
CRITICAL VALUE ALERT  Critical value received:  CO2 40  Date of notification:  07/10/12  Time of notification:  0647  Critical value read back:yes  Nurse who received alert:  Courtney Heys, RN  MD notified (1st page):  [facility] on call  Time of first page:  [phone]  MD notified (2nd page):  Time of second page:  Responding MD:  Wilburt Finlay, PA  Time MD responded:  0700

## 2012-07-10 NOTE — Progress Notes (Signed)
Pt. Seen and examined. Agree with the NP/[name] note as written.  Continue lasix gtts. Diuresing well.  Loading on amiodarone. Consider repeat cardioversion attempt at a later date. On xarelto for anticoagulation.  Chrystie Nose, MD, [facility] Attending Cardiologist [facility]

## 2012-07-10 NOTE — Progress Notes (Signed)
Patient evaluated for long-term disease management services with [facility] Care [facility] Program as a benefit of his [employer] Medicare insurance. Spoke with [name] at bedside and he reports his son currently lives with him and that he has [place] home health services. However, he reports he wants to go "somewhere for therapy" after discharge. Discussed if he was speaking of [facility], he acknowledged that is his preference. Left contact information and brochure with patient. Made him aware that will follow up with him at discharge if his disposition is home and at that time he will receive a post transition of care call and will be evaluated for monthly home visits.[name] Noble, MSN- Julaine Fusi, [facility], [phone]

## 2012-07-10 NOTE — Progress Notes (Signed)
Physical Therapy Treatment Patient Details Name: [name] MRN: [id number] DOB: March 02, 1927 Today's Date: 07/10/2012 Time: 4098-1191 PT Time Calculation (min): 24 min  PT Assessment / Plan / Recommendation Comments on Treatment Session  Pt admitted with weakness and collapse from home with bil LE edema. Pt with excellent progression with mobility today able to ambulate to door x 2 with RW and perform HEP. HR maintained 96-128 throughout all activity and remained on 2L O2. Pt encouraged to continue OOB to chair with nursing and HEp. Will continue to follow.     Follow Up Recommendations        Does the patient have the potential to tolerate intense rehabilitation     Barriers to Discharge        Equipment Recommendations       Recommendations for Other Services    Frequency     Plan Discharge plan remains appropriate;Frequency remains appropriate    Precautions / Restrictions Precautions Precautions: Fall Precaution Comments: watch HR   Pertinent Vitals/Pain No pain    Mobility  Bed Mobility Bed Mobility: Rolling Right;Right Sidelying to Sit;Sitting - Scoot to Edge of Bed Rolling Right: 5: Supervision;With rail Right Sidelying to Sit: 4: Min assist;HOB flat;With rails Sitting - Scoot to Edge of Bed: 5: Supervision Details for Bed Mobility Assistance: cueing for sequence and technique with use of rail and min assist to elevate trunk from surface Transfers Transfers: Sit to Stand;Stand to Sit Sit to Stand: 4: Min assist;From bed;From chair/3-in-1 Stand to Sit: 4: Min guard;To chair/3-in-1;With armrests Details for Transfer Assistance: cueing for hand placement, safety and sequence particularly to scoot to edge and lean forward with transition to stand. pt has tendency to try to pull on RW and benefits from education for sequence Ambulation/Gait Ambulation/Gait Assistance: 4: Min assist Ambulation Distance (Feet): 32 Feet (x 2 trials) Assistive device: Rolling  walker Ambulation/Gait Assistance Details: cueing for posture and position in RW Gait Pattern: Step-through pattern;Decreased stride length;Trunk flexed Gait velocity: decreased Stairs: No    Exercises General Exercises - Lower Extremity Long Arc Quad: AROM;Both;15 reps;Seated Hip Flexion/Marching: AROM;Both;15 reps;Seated   PT Diagnosis:    PT Problem List:   PT Treatment Interventions:     PT Goals Acute Rehab PT Goals Pt will go Supine/Side to Sit: with modified independence;with HOB 0 degrees PT Goal: Supine/Side to Sit - Progress: Updated due to goal met PT Goal: Sit to Stand - Progress: Progressing toward goal PT Goal: Stand to Sit - Progress: Progressing toward goal Pt will Transfer Bed to Chair/Chair to Bed: with supervision PT Transfer Goal: Bed to Chair/Chair to Bed - Progress: Updated due to goal met Pt will Ambulate: 51 - 150 feet;with supervision;with rolling walker PT Goal: Ambulate - Progress: Updated due to goal met PT Goal: Perform Home Exercise Program - Progress: Progressing toward goal  Visit Information  Last PT Received On: 07/10/12 Assistance Needed: +1    Subjective Data  Subjective: I dont' know if I can walk   Cognition  Cognition Overall Cognitive Status: Appears within functional limits for tasks assessed/performed Arousal/Alertness: Awake/alert Orientation Level: Appears intact for tasks assessed Behavior During Session: [facility] for tasks performed    Balance     End of Session PT - End of Session Equipment Utilized During Treatment: Gait belt;Oxygen Activity Tolerance: Patient tolerated treatment well Patient left: in chair;with call bell/phone within reach Nurse Communication: Mobility status   GP     Toney Sang [facility] 07/10/2012, 10:47 AM Toney Sang [name],  PT [phone]

## 2012-07-10 NOTE — Care Management Note (Signed)
    Page 1 of 1   07/10/2012     2:57:17 PM   CARE MANAGEMENT NOTE 07/10/2012  Patient:  [name], [name]   Account Number:  [id number]  Date Initiated:  07/10/2012  Documentation initiated by:  Oletta Cohn  Subjective/Objective Assessment:   79 y.o. male with past medical history of hypertension, lower extremity edema and chronic kidney disease. Patient came to the hospital after he collapsed to the floor today secondary to a generalized weakness.     Action/Plan:   Home with son   Anticipated DC Date:  07/13/2012   Anticipated DC Plan:  HOME/SELF CARE         Choice offered to / List presented to:             Status of service:   Medicare Important Message given?   (If response is "NO", the following Medicare IM given date fields will be blank) Date Medicare IM given:   Date Additional Medicare IM given:    Discharge Disposition:    Per UR Regulation:    If discussed at Long Length of Stay Meetings, dates discussed:    Comments:

## 2012-07-10 NOTE — Telephone Encounter (Signed)
Kandra Nicolas (daughter) would like you to call her concerning pt. She needs Cardiologist referral w/in [facility] and extended care facilities after pt leave hosptial. Pt in [facility] : Rm [phone] She will be at work until 4pm. After 4pm, cell: [phone]

## 2012-07-11 LAB — URINE CULTURE

## 2012-07-11 LAB — BASIC METABOLIC PANEL
CO2: 40 mEq/L (ref 19–32)
Chloride: 94 mEq/L — ABNORMAL LOW (ref 96–112)
Sodium: 139 mEq/L (ref 135–145)

## 2012-07-11 LAB — GLUCOSE, CAPILLARY
Glucose-Capillary: 93 mg/dL (ref 70–99)
Glucose-Capillary: 107 mg/dL — ABNORMAL HIGH (ref 70–99)

## 2012-07-11 MED ORDER — BUMETANIDE 1 MG PO TABS
1.0000 mg | ORAL_TABLET | Freq: Every day | ORAL | Status: DC
  Administered 2012-07-11 – 2012-07-12 (×2): 1 mg via ORAL
  Filled 2012-07-11 (×3): qty 1

## 2012-07-11 NOTE — Progress Notes (Signed)
CRITICAL VALUE ALERT  Critical value received:  Carbon Dioxide (CO2) - 40  Date of notification:  07/11/2012  Time of notification:  0733  Critical value read back:yes  Nurse who received alert:  Milta Deiters  MD notified (1st page):Dr. Zoila Shutter    Time of first page:  [phone]  MD notified (2nd page):n/a  Time of second page:n/a  Responding MD:  Dr. Zoila Shutter  Time MD responded:  [phone]

## 2012-07-11 NOTE — Progress Notes (Signed)
Pt. Seen and examined. Agree with the NP/[name] note as written.  Urine output has been excellent on lasix gtts. Will switch to po bumex today. Keep foley in until tomorrow. Will consult SW for SNF dispo. Will need wound care for sacral decubitus which was documented this morning, suspect it has been there for some time.  Continue po amiodarone, can decrease to 400 mg daily on discharge.  Chrystie Nose, MD, [facility] Attending Cardiologist [facility]

## 2012-07-11 NOTE — Clinical Social Work Psychosocial (Signed)
     Clinical Social Work Department BRIEF PSYCHOSOCIAL ASSESSMENT 07/11/2012  Patient:  [name], [name]     Account Number:  [id number]     Admit date:  07/05/2012  Clinical Social Worker:  Tiburcio Pea  Date/Time:  07/11/2012 05:14 PM  Referred by:  Physician  Date Referred:  07/10/2012 Referred for  SNF Placement   Other Referral:   Interview type:  Other - See comment Other interview type:   Patient  (in person)  and daughter [name])- via phone    PSYCHOSOCIAL DATA Living Status:  WITH ADULT CHILDREN Admitted from facility:   Level of care:   Primary support name:  [name]  [phone]  (w) [phone] Primary support relationship to patient:  CHILD, ADULT Degree of support available:   Strong support    Son:  [name]  (c) [phone]  [phone]    CURRENT CONCERNS Current Concerns  Post-Acute Placement   Other Concerns:    SOCIAL WORK ASSESSMENT / PLAN CSW met with patient today and spoke to his daughter via phone. Paitient lives with his son and agrees to short term SNF prior to returning home.  CSW discusssed bed search process and patient agreed to search in [facility]. Fl2 placed on shadow chart for MD's signature.  FL2 sent to area SNFS for review   Assessment/plan status:  Psychosocial Support/Ongoing Assessment of Needs Other assessment/ plan:   Information/referral to community resources:   SNF bed list provided to patient and daughter    PATIENTS/FAMILYS RESPONSE TO PLAN OF CARE: Patient and daughter are both agreeable to short term SNF and are appreicative of CSW's information and assistance. Daughter has been checking on some SNFS via computer and was able to verbalized several facilities that they would be interested in.  Active bed search is in place

## 2012-07-11 NOTE — Progress Notes (Signed)
Pharmacist Heart Failure Core Measure Documentation  Assessment: [name] has an EF documented as 40% on 07/07/12 by Echo.  Rationale: Heart failure patients with left ventricular systolic dysfunction (LVSD) and an EF < 40% should be prescribed an angiotensin converting enzyme inhibitor (ACEI) or angiotensin receptor blocker (ARB) at discharge unless a contraindication is documented in the medical record.  This patient is not currently on an ACEI or ARB for HF.  This note is being placed in the record in order to provide documentation that a contraindication to the use of these agents is present for this encounter.  ACE Inhibitor or Angiotensin Receptor Blocker is contraindicated (specify all that apply)  []   ACEI allergy AND ARB allergy []   Angioedema []   Moderate or severe aortic stenosis []   Hyperkalemia [x]   Hypotension []   Renal artery stenosis []   Worsening renal function, preexisting renal disease or dysfunction  [name], Pharm D 07/11/2012 11:49 AM

## 2012-07-11 NOTE — Clinical Social Work Placement (Addendum)
     Clinical Social Work Department CLINICAL SOCIAL WORK PLACEMENT NOTE 07/14/2012  Patient:  [name], [name]  Account Number:  [id number] Admit date:  07/05/2012  Clinical Social Worker:  Lupita Leash Mallery Harshman, LCSWA  Date/time:  07/11/2012 05:51 PM  Clinical Social Work is seeking post-discharge placement for this patient at the following level of care:   SKILLED NURSING   (*CSW will update this form in Epic as items are completed)   07/11/2012  Patient/family provided with [facility] Department of Clinical Social Works list of facilities offering this level of care within the geographic area requested by the patient (or if unable, by the patients family).  07/11/2012  Patient/family informed of their freedom to choose among providers that offer the needed level of care, that participate in Medicare, Medicaid or managed care program needed by the patient, have an available bed and are willing to accept the patient.  07/11/2012  Patient/family informed of [facility] ownership interest in [facility], as well as of the fact that they are under no obligation to receive care at this facility.  PASARR submitted to EDS on 07/12/2012 PASARR number received from EDS on 07/12/2012  FL2 transmitted to all facilities in geographic area requested by pt/family on  07/11/2012 FL2 transmitted to all facilities within larger geographic area on   Patient informed that his/her managed care company has contracts with or will negotiate with  certain facilities, including the following:   [facility] Medicare-- Authorization request sent     Patient/family informed of bed offers received:  07/13/2012 Patient chooses bed at [facility] Physician recommends and patient chooses bed at    Patient to be transferred to [facility] on  07/14/2012 Patient to be transferred to facility by Ambulance  Sharin Mons)  The following physician request were entered in  Epic:   Additional Comments: 07/14/12  Patient, son, daughter were all very pleased wtih d/c arrangments to [employer].  Selena Batten, CM- Cyndee Brightly Medicare gave EMS and SNF authorization.  Notified SNF and pt's nurse [place] of d/c plan.  No further CSW needs identified;  CSW signing off.  Lorri Frederick. Kauan Kloosterman, LCSWA  [phone]

## 2012-07-11 NOTE — Consult Note (Signed)
WOC consult Note Reason for Consult: eval. Sacral wound.  Pt found to have MASD (moisture associated skin damage) mirrored image within the gluteal skin folds, not on pressure point. Wound type:MASD (moisture associated skin damage) Wound bed: generalized blanchable area deep in gluteal skin folds, with some linear skin breaks but pink, moist, fissure at the deepest point in the apex of the gluteal folds all related to moisture Drainage (amount, consistency, odor) minimal Periwound:intact Dressing procedure/placement/frequency: zinc based barrier ointment PRN and at least qshift. Turn from side to side. Management of incontinence if possible.   Re consult if needed, will not follow at this time. Thanks  Maneh Sieben [employer], CWOCN [phone])

## 2012-07-11 NOTE — Progress Notes (Signed)
Notified Dr. Rennis Golden of low diastolic - 9540 b/p this morning. Orders given to discontinue lasix drip and to administer Bumex. Both were carried out by nurse. Patient has no complaints of pain or discomfort at this time and will continue to monitor as needed.

## 2012-07-11 NOTE — Telephone Encounter (Signed)
Per dr Lovell Sheehan- he suggested [facility] [address]

## 2012-07-11 NOTE — Progress Notes (Signed)
[facility]  Subjective: No complaints  Objective: Vital signs in last 24 hours: Temp:  [98.4 F (36.9 C)-99.6 F (37.6 C)] 99.6 F (37.6 C) (02/25 0528) Pulse Rate:  [83-91] 84 (02/25 0528) Resp:  [18-20] 20 (02/25 0528) BP: (102-113)/(45-49) 103/46 mmHg (02/25 0528) SpO2:  [96 %-99 %] 96 % (02/25 0528) Weight:  [102.331 kg (225 lb 9.6 oz)] 102.331 kg (225 lb 9.6 oz) (02/25 0528) Last BM Date: 07/10/12  Intake/Output from previous day: 02/24 0701 - 02/25 0700 In: 970 [P.O.:970] Out: 3902 [Urine:3900; Stool:2] Intake/Output this shift: Total I/O In: 240 [P.O.:240] Out: 750 [Urine:750]  Medications Current Facility-Administered Medications  Medication Dose Route Frequency Provider Last Rate Last Dose  . 0.9 %  sodium chloride infusion   Intravenous Continuous Wilburt Finlay, PA 10 mL/hr at 07/07/12 1735 10 mL/hr at 07/07/12 1735  . acetaminophen (TYLENOL) tablet 500 mg  500 mg Oral Q8H PRN Clydia Llano, MD   500 mg at 07/06/12 1653  . amiodarone (PACERONE) tablet 400 mg  400 mg Oral BID Wilburt Finlay, PA   400 mg at 07/10/12 2234  . antiseptic oral rinse (BIOTENE) solution 15 mL  15 mL Mouth Rinse BID Chrystie Nose, MD   15 mL at 07/10/12 2000  . digoxin (LANOXIN) tablet 0.25 mg  0.25 mg Oral Daily Marinda Elk, MD   0.25 mg at 07/10/12 1046  . furosemide (LASIX) 250 mg in dextrose 5 % 250 mL infusion  4 mg/hr Intravenous Continuous Wilburt Finlay, PA 4 mL/hr at 07/10/12 2239 4 mg/hr at 07/10/12 2239  . gabapentin (NEURONTIN) capsule 600 mg  600 mg Oral TID Clydia Llano, MD   600 mg at 07/10/12 2234  . insulin aspart (novoLOG) injection 0-9 Units  0-9 Units Subcutaneous TID WC Clydia Llano, MD   1 Units at 07/10/12 1152  . metoprolol (LOPRESSOR) injection 10 mg  10 mg Intravenous Q6H PRN Marinda Elk, MD   10 mg at 07/07/12 1117  . ondansetron (ZOFRAN) tablet 4 mg  4 mg Oral Q6H PRN Clydia Llano, MD       Or  . ondansetron (ZOFRAN)  injection 4 mg  4 mg Intravenous Q6H PRN Clydia Llano, MD      . pantoprazole (PROTONIX) EC tablet 40 mg  40 mg Oral Q breakfast Clydia Llano, MD   40 mg at 07/11/12 0615  . polyethylene glycol (MIRALAX / GLYCOLAX) packet 17 g  17 g Oral Daily Clydia Llano, MD   17 g at 07/10/12 1047  . Rivaroxaban (XARELTO) tablet 20 mg  20 mg Oral Q supper Clydia Llano, MD   20 mg at 07/10/12 1621  . senna (SENOKOT) tablet 17.2 mg  2 tablet Oral Daily PRN Leda Gauze, NP   17.2 mg at 07/05/12 2252  . sodium chloride 0.9 % injection 3 mL  3 mL Intravenous Q12H Clydia Llano, MD   3 mL at 07/10/12 2235  . Tamsulosin HCl (FLOMAX) capsule 0.4 mg  0.4 mg Oral QPC breakfast Clydia Llano, MD   0.4 mg at 07/10/12 1040  . tiotropium (SPIRIVA) inhalation capsule 18 mcg  18 mcg Inhalation Daily Clydia Llano, MD   18 mcg at 07/11/12 0925  . traMADol (ULTRAM) tablet 50 mg  50 mg Oral Q8H PRN Clydia Llano, MD   50 mg at 07/11/12 0616  . zolpidem (AMBIEN) tablet 5 mg  5 mg Oral QHS PRN Clydia Llano, MD   5 mg at 07/08/12  2159    PE: General appearance: alert, cooperative and no distress  Lungs: Decreased effort/BS. No Wheeze or rhonchi.  Heart: irregularly irregular rhythm and 1/6 Sys MM  Abdomen: soft, nontender, +BS  Extremities: 1+ LEE  Pulses: 2+ and symmetric  Skin: warm and dry.  Grade 2 sacral ulcer.  ~3.0 cm.   Neurologic: Grossly normal   Lab Results:  No results found for this basename: WBC, HGB, HCT, PLT,  in the last 72 hours BMET  Recent Labs  07/09/12 0520 07/10/12 0502 07/11/12 0606  NA 143 139 139  K 3.8 3.3* 3.6  CL 98 96 94*  CO2 39* 40* 40*  GLUCOSE 109* 101* 98  BUN 14 12 14   CREATININE 0.86 0.76 0.82  CALCIUM 9.1 8.8 8.9    Assessment/Plan  Principal Problem:   Acute systolic CHF (congestive heart failure), NYHA class 3 -- Unclear etilogy (? Afib related) EF down from 60-70% to 40%. Active Problems:   HYPERLIPIDEMIA   Type II or unspecified type diabetes mellitus with  other specified manifestations, uncontrolled   HYPERTENSION   Paroxysmal Atrial fibrillation - admitted with RVR   Lower extremity edema   General weakness  Plan:  1. Continues on Lasix drip. Net fluids: -2.9L last 24hrs. Stable SCr.  Will change to PO bumex   2. Afib: Loading on PO amio. Was given 150mg  bolus given on Sunday. HR 90-110. Will consider repeat attempt at [facility]  3. K+ WNL.  4. Hypotension:  BP Stable 5.  Grade two sacral ulcer.  Maintain foley cath for now.  Turn pt frequently 6.  Short tem SNF recommended.  CSW arranging.   Plan:     LOS: 6 days    [name] 07/11/2012 9:54 AM

## 2012-07-11 NOTE — Progress Notes (Signed)
Rehab Admissions Coordinator Note:  Asked by RN CM to assess pt for inpt rehab admission.   At this time, we are recommending Skilled Nursing Facility for both PT and OT do not recommend CIR/inpt rehab admission at this time.  [name] 07/11/2012, 11:42 AM  I can be reached at [phone].

## 2012-07-11 NOTE — Progress Notes (Signed)
Patient has roughly a stage II wound noted on scaral area of bottom. Date of when wound occurred is unclear but does not appear that it occurred yesterday or today. Wound cleansed and dry with application of foam gauze. ..Marland KitchenMarland KitchenInserted Wound consult, Wound nurse came to assess and directions given to treat wound every shift. Patient continues to turn independently every two hours and for reminders written on white board as reminders for the patient. Currently patient complains of no pain or discomfort. Will continue to monitor to end of shift.

## 2012-07-12 LAB — BASIC METABOLIC PANEL
BUN: 15 mg/dL (ref 6–23)
Creatinine, Ser: 0.81 mg/dL (ref 0.50–1.35)
GFR calc Af Amer: >90 mL/min (ref >90)
GFR calc non Af Amer: 80 mL/min — ABNORMAL LOW (ref >90)

## 2012-07-12 LAB — URINALYSIS, ROUTINE W REFLEX MICROSCOPIC
Glucose, UA: NEGATIVE mg/dL
Specific Gravity, Urine: 1.012 (ref 1.005–1.030)
Urobilinogen, UA: 0.2 mg/dL (ref 0.0–1.0)

## 2012-07-12 LAB — URINE MICROSCOPIC-ADD ON

## 2012-07-12 MED ORDER — BUMETANIDE 1 MG PO TABS
1.0000 mg | ORAL_TABLET | Freq: Two times a day (BID) | ORAL | Status: DC
  Administered 2012-07-12 – 2012-07-14 (×4): 1 mg via ORAL
  Filled 2012-07-12 (×7): qty 1

## 2012-07-12 MED ORDER — ALPRAZOLAM 0.25 MG PO TABS: 0.2500 mg | ORAL_TABLET | Freq: Three times a day (TID) | ORAL | Status: DC | PRN

## 2012-07-12 NOTE — Progress Notes (Signed)
[facility]  Subjective: No complaints. Denies SOB.  Objective: Vital signs in last 24 hours: Temp:  [98 F (36.7 C)-100.4 F (38 C)] 98.6 F (37 C) (02/26 0501) Pulse Rate:  [79-101] 79 (02/26 0501) Resp:  [18-20] 20 (02/26 0501) BP: (92-110)/(46-59) 102/46 mmHg (02/26 0501) SpO2:  [95 %-97 %] 97 % (02/26 0501) Weight:  [223 lb 8.7 oz (101.4 kg)] 223 lb 8.7 oz (101.4 kg) (02/26 0501) Last BM Date: 07/10/12  Intake/Output from previous day: 02/25 0701 - 02/26 0700 In: 1560 [P.O.:1560] Out: 2250 [Urine:2250] Intake/Output this shift: Total I/O In: 360 [P.O.:360] Out: -   Medications Current Facility-Administered Medications  Medication Dose Route Frequency Provider Last Rate Last Dose  . 0.9 %  sodium chloride infusion   Intravenous Continuous Wilburt Finlay, PA 10 mL/hr at 07/07/12 1735 10 mL/hr at 07/07/12 1735  . acetaminophen (TYLENOL) tablet 500 mg  500 mg Oral Q8H PRN Clydia Llano, MD   500 mg at 07/12/12 0125  . amiodarone (PACERONE) tablet 400 mg  400 mg Oral BID Wilburt Finlay, PA   400 mg at 07/12/12 0925  . antiseptic oral rinse (BIOTENE) solution 15 mL  15 mL Mouth Rinse BID Chrystie Nose, MD   15 mL at 07/12/12 0925  . bumetanide (BUMEX) tablet 1 mg  1 mg Oral Daily Chrystie Nose, MD   1 mg at 07/12/12 0925  . digoxin (LANOXIN) tablet 0.25 mg  0.25 mg Oral Daily Marinda Elk, MD   0.25 mg at 07/12/12 0925  . gabapentin (NEURONTIN) capsule 600 mg  600 mg Oral TID Clydia Llano, MD   600 mg at 07/12/12 0926  . insulin aspart (novoLOG) injection 0-9 Units  0-9 Units Subcutaneous TID WC Clydia Llano, MD   1 Units at 07/10/12 1152  . metoprolol (LOPRESSOR) injection 10 mg  10 mg Intravenous Q6H PRN Marinda Elk, MD   10 mg at 07/07/12 1117  . ondansetron (ZOFRAN) tablet 4 mg  4 mg Oral Q6H PRN Clydia Llano, MD       Or  . ondansetron (ZOFRAN) injection 4 mg  4 mg Intravenous Q6H PRN Clydia Llano, MD      . pantoprazole  (PROTONIX) EC tablet 40 mg  40 mg Oral Q breakfast Clydia Llano, MD   40 mg at 07/12/12 0557  . polyethylene glycol (MIRALAX / GLYCOLAX) packet 17 g  17 g Oral Daily Clydia Llano, MD   17 g at 07/12/12 0927  . Rivaroxaban (XARELTO) tablet 20 mg  20 mg Oral Q supper Clydia Llano, MD   20 mg at 07/11/12 1749  . senna (SENOKOT) tablet 17.2 mg  2 tablet Oral Daily PRN Leda Gauze, NP   17.2 mg at 07/05/12 2252  . sodium chloride 0.9 % injection 3 mL  3 mL Intravenous Q12H Clydia Llano, MD   3 mL at 07/12/12 0927  . Tamsulosin HCl (FLOMAX) capsule 0.4 mg  0.4 mg Oral QPC breakfast Clydia Llano, MD   0.4 mg at 07/12/12 0927  . tiotropium (SPIRIVA) inhalation capsule 18 mcg  18 mcg Inhalation Daily Clydia Llano, MD   18 mcg at 07/11/12 0925  . traMADol (ULTRAM) tablet 50 mg  50 mg Oral Q8H PRN Clydia Llano, MD   50 mg at 07/12/12 0600  . zolpidem (AMBIEN) tablet 5 mg  5 mg Oral QHS PRN Clydia Llano, MD   5 mg at 07/11/12 2203    PE: General appearance:  alert, cooperative and no distress Neck: JVD at the level of the ear w +HJR Lungs: decreased breath sounds at bilateral lung bases, no crackles, wheezing or rhonchi Heart: irregularly irregular rhythm Abdomen: obese, +BS, soft, nontender Extremities: 1+ bilateral LEE Pulses: 2+ and symmetric Skin: warm and dry Neurologic: Grossly normal  Lab Results:  No results found for this basename: WBC, HGB, HCT, PLT,  in the last 72 hours BMET  Recent Labs  07/10/12 0502 07/11/12 0606 07/12/12 0535  NA 139 139 139  K 3.3* 3.6 3.5  CL 96 94* 96  CO2 40* 40* 39*  GLUCOSE 101* 98 96  BUN 12 14 15   CREATININE 0.76 0.82 0.81  CALCIUM 8.8 8.9 9.0    Assessment/Plan  Principal Problem:   Acute systolic CHF (congestive heart failure), NYHA class 3 -- Unclear etilogy (? Afib related) EF down from 60-70% to 40%. Active Problems:   HYPERLIPIDEMIA   Type II or unspecified type diabetes mellitus with other specified manifestations,  uncontrolled   HYPERTENSION   Paroxysmal Atrial fibrillation - admitted with RVR   Lower extremity edema   General weakness  Plan:  Pt was switched from IV Lasix to PO Bumex yesterday. He has diuresed 0.69 L in last 24 hrs. Lungs sound clear on exam, but he seems to have JVD  up to the level of the ear with + HJR. Still has LEE. Bumex dosing is 1 mg daily. ? If pt needs to be on a higher dose. Cr is stable at 0.81. K+ is 3.5. BNP today is 3,480 (down from 4,013 2 days ago). He is still in A-fib on telemetry. Amiodarone was started yesterday. Rate is controlled in the upper 70's - mid 80's. [facility]. Per Dr Blanchie Dessert note on 2/24, may need to consider repeat cardioversion attempt at a later date. BP is stable. Most recent BP was 102/46.  Pt was found to have a sacral decubitus ulcer yesterday. Wound Care was consulted and has been managing this. Pt has also been working with physical therapy. Pt should be close to d/c. Social work was consulted yesterday to help with SNF placement. Pt will need f/u at [facility] within 5-7 days. He does not have a primary cardiologist. Will need to get him established with one. He has been pleased with [facility] MDs this hospitalization, and would like to follow up at our [facility] office. Will arrange prior to discharge. MD to follow with further recommendation.      LOS: 7 days    Shakala Marlatt M. Sharol Harness, [name] 07/12/2012 11:44 AM

## 2012-07-12 NOTE — Plan of Care (Signed)
Problem: Phase I Progression Outcomes Goal: EF % per last Echo/documented,Core Reminder form on chart Outcome: Completed/Met Date Met:  07/12/12 Echo on 07/07/2012 showed an EF of 40%.

## 2012-07-12 NOTE — Progress Notes (Signed)
Bed offers in place and have discussed in detail with patient's daughter [name].  Bed offers in place from facilities of family choice. Daughter stated that she was informed today that patient would not be medically stable until Friday.MD please clarify.  Patient and family will choose between [facility] and [facility] and will let CSW know tomorrow morning. Both facilities can offer private rooms.   CSW will assist with d/c when stable per MD. Lupita Leash T. [place] Pugh  [phone]

## 2012-07-12 NOTE — Progress Notes (Signed)
I have seen and evaluated the patient this PM along with Boyce Medici, PA. I agree with her findings, examination as well as impression recommendations.  Did quite well on lasix gtt -- still had 2250 out (only net 690 -) after converting to PO Bumex. - Will change to BID from QDay. (CO2 level seems to have plateaued) Rate somewhat better controlled & was switched to PO amiodarone -- continue with oral load; continue Xarelto --> may very well consider re-attempt @ [facility] (even potentially prior to d/c).  Continue Digoxin.  BP still not adequate to consider ACE-I for afterload reduction.  May well be "compensated" enough from a CHF standpoint to start low dose BB for additional rate control.  Bacteruria -- will need to d/c foley (consider condom catheter); Recheck UA -- but will likely need to Rx with Ampicillin per Pharmacy recommendations if has dysuria once foley out.  Will need to start d/c planning for SNF.  He is close to d/c, but will need <1wk ROV @  [facility] office with Advanced Practitioner (for titration of cardiac meds -- amiodarone, Bumex, BB & consider when to start ACE-I/ARB) followed by Dr. Rennis Golden 2-3 weeks.    Appreciate WOCRN input for sacral decubitus.  Need to start mobilizing the patient with PT/OT.   Will need to address code status.  Marykay Lex, M.D., M.S. [facility] [address] [place], [place]  [address]  [phone] Pager # [phone] 07/12/2012 1:17 PM

## 2012-07-12 NOTE — Clinical Documentation Improvement (Signed)
Abnormal Labs Clarification  CLINICAL DOCUMENTATION QUERIES ARE NOT PART OF THE PERMANENT MEDICAL RECORD  Please update your documentation within the medical record to reflect your response to this query.                                                                                    07/12/12  [facility],  Abnormal findings (laboratory, x-ray, and other diagnostic results) are not coded and reported unless the physician indicates their clinical significance.     The medical record reflects the following clinical findings:  K+  3.3;  3.2;  3.3 this admission  Treated with oral Potassium on 07/06/12, 07/08/12, and 07/10/12  Treatment evaluated with Serial Chemistries   Based on your clinical judgment, please document the clinical condition associated with abnormal lab results in the progress notes and discharge summary:   - Hypokalemia   - Other Condition   - Unable to Clinically Determine   In responding to this query please exercise your independent judgment.    The fact that a query is asked, does not imply that any particular answer is desired or expected.  Reviewed: 07/14/12 - query never addressed - ndrgi.  Mathis Dad RN   Thank You,  Jerral Ralph  RN BSN CCDS Certified Clinical Documentation Specialist: Cell   [phone]  Health Information Management [place]  TO RESPOND TO THE THIS QUERY, FOLLOW THE INSTRUCTIONS BELOW:  1. If needed, update documentation for the patient's encounter via the notes activity.  2. Access this query again and click edit on the [employer].  3. After updating, or not, click F2 to complete all highlighted (required) fields concerning your review. Select "additional documentation in the medical record" OR "no additional documentation provided".  4. Click Sign note button.  5. The deficiency will fall out of your InBasket *Please let us know if  you are not able to complete this workflow by phone or e-mail (listed below).

## 2012-07-12 NOTE — Progress Notes (Signed)
Physical Therapy Treatment Patient Details Name: [name] MRN: [id number] DOB: [birth year] Today's Date: 07/12/2012 Time: 0916-0950 PT Time Calculation (min): 34 min  PT Assessment / Plan / Recommendation Comments on Treatment Session  Pt increasing ambulation today with VSS, working especially on sit to stand transfers as pt having more difficulty with this than PTA. Pt improved with trials from various height surfaces. Pt perfomed exercises program with therapist and encouraged to perform it again this afternoon. PT will continue to follow.    Follow Up Recommendations  SNF;Supervision for mobility/OOB     Does the patient have the potential to tolerate intense rehabilitation     Barriers to Discharge        Equipment Recommendations  Rolling walker with 5" wheels    Recommendations for Other Services    Frequency Min 3X/week   Plan Discharge plan remains appropriate;Frequency remains appropriate    Precautions / Restrictions Precautions Precautions: Fall Precaution Comments: watch HR Restrictions Weight Bearing Restrictions: No   Pertinent Vitals/Pain VSS    Mobility  Bed Mobility Bed Mobility: Not assessed Transfers Transfers: Sit to Stand;Stand to Sit Sit to Stand: 3: Mod assist;4: Min assist;From toilet;From chair/3-in-1;With upper extremity assist Stand to Sit: 4: Min guard;4: Min assist;To toilet;With upper extremity assist;To chair/3-in-1 Details for Transfer Assistance: practiced sit to stand multiple times because this is an area that pt would like to improve upon. With first standing from chair, pt required mod A, from toilet, he required min and third time (from chair), min. Pt cued to bring chest further fwd which resulted in less help needed. Pt usually sits on sofa at home which will likely be hard to rise from, spoke with him about possibility of lift chair  Ambulation/Gait Ambulation/Gait Assistance: 4: Min assist Ambulation Distance (Feet): 35  Feet Assistive device: Rolling walker Ambulation/Gait Assistance Details: cueing for WB through UE to safeguard against left knee buckling. PT with imrpved knee control with distance. Ambulation broken up into 2 bouts with seated rest in between Gait Pattern: Decreased weight shift to left;Decreased stride length;Step-through pattern;Left flexed knee in stance;Shuffle;Trunk flexed Gait velocity: decreased Stairs: No Wheelchair Mobility Wheelchair Mobility: No    Exercises General Exercises - Upper Extremity Chair Push Up: 10 reps;AROM;Seated General Exercises - Lower Extremity Long Arc Quad: AROM;Both;15 reps;Seated Hip Flexion/Marching: AROM;Both;15 reps;Seated   PT Diagnosis:    PT Problem List:   PT Treatment Interventions:     PT Goals Acute Rehab PT Goals PT Goal Formulation: With patient Time For Goal Achievement: 07/20/12 Potential to Achieve Goals: Fair Pt will go Supine/Side to Sit: with modified independence;with HOB 0 degrees PT Goal: Supine/Side to Sit - Progress: Progressing toward goal Pt will go Sit to Supine/Side: with min assist;with HOB 0 degrees PT Goal: Sit to Supine/Side - Progress: Progressing toward goal Pt will go Sit to Stand: with supervision PT Goal: Sit to Stand - Progress: Progressing toward goal Pt will go Stand to Sit: with supervision PT Goal: Stand to Sit - Progress: Progressing toward goal Pt will Transfer Bed to Chair/Chair to Bed: with supervision PT Transfer Goal: Bed to Chair/Chair to Bed - Progress: Progressing toward goal Pt will Ambulate: 51 - 150 feet;with supervision;with rolling walker PT Goal: Ambulate - Progress: Progressing toward goal Pt will Perform Home Exercise Program: with supervision, verbal cues required/provided PT Goal: Perform Home Exercise Program - Progress: Progressing toward goal  Visit Information  Last PT Received On: 07/12/12 Assistance Needed: +1    Subjective Data  Subjective: I'm getting better Patient  Stated Goal: be able to walk   Cognition  Cognition Overall Cognitive Status: Appears within functional limits for tasks assessed/performed Arousal/Alertness: Awake/alert Orientation Level: Appears intact for tasks assessed Behavior During Session: So [facility] for tasks performed    Balance  Balance Balance Assessed: Yes Dynamic Standing Balance Dynamic Standing - Balance Support: Bilateral upper extremity supported;During functional activity Dynamic Standing - Level of Assistance: 4: Min assist  End of Session PT - End of Session Equipment Utilized During Treatment: Gait belt;Oxygen Activity Tolerance: Patient tolerated treatment well Patient left: in chair;with call bell/phone within reach Nurse Communication: Mobility status   GP   Lyanne Co, PT  Acute Rehab Services  [phone]   Lyanne Co 07/12/2012, 11:18 AM

## 2012-07-12 NOTE — Progress Notes (Signed)
Occupational Therapy Treatment Patient Details Name: [name] Conrey MRN: [id number] DOB: 22-May-1926 Today's Date: 07/12/2012 Time: 4098-1191 OT Time Calculation (min): 12 min  OT Assessment / Plan / Recommendation Comments on Treatment Session Focus of session on demonstrating functional toilet transfer today. Pt progressing with mobility. Continue to recommend SNF.    Follow Up Recommendations  SNF    Barriers to Discharge       Equipment Recommendations  None recommended by OT    Recommendations for Other Services    Frequency Min 2X/week   Plan Discharge plan remains appropriate    Precautions / Restrictions Precautions Precautions: Fall Precaution Comments: watch HR Restrictions Weight Bearing Restrictions: No   Pertinent Vitals/Pain See vitals    ADL  Grooming: Performed;Brushing hair;Set up Where Assessed - Grooming: Unsupported sitting Toilet Transfer: Performed;Minimal assistance Toilet Transfer Method: Sit to stand Toilet Transfer Equipment: Bedside commode Transfers/Ambulation Related to ADLs: min assist with RW. left knee not buckling this session. ADL Comments: Pt had just tranferred to chair on OT arrival.  Pt able to demonstrate toilet transfer at min assist level and ambulating ~10 ft to/from 3n1.      OT Diagnosis:    OT Problem List:   OT Treatment Interventions:     OT Goals ADL Goals Pt Will Perform Grooming: Unsupported;Standing at sink ADL Goal: Grooming - Progress: Progressing toward goals Pt Will Transfer to Toilet: with min assist;Ambulation;Comfort height toilet;Grab bars;3-in-1;with DME ADL Goal: Toilet Transfer - Progress: Progressing toward goals  Visit Information  Last OT Received On: 07/12/12 Assistance Needed: +1    Subjective Data      Prior Functioning       Cognition  Cognition Overall Cognitive Status: Appears within functional limits for tasks assessed/performed Arousal/Alertness: Awake/alert Orientation Level:  Appears intact for tasks assessed Behavior During Session: [facility] for tasks performed    Mobility  Bed Mobility Bed Mobility: Not assessed Transfers Transfers: Sit to Stand;Stand to Sit Sit to Stand: 4: Min assist;From chair/3-in-1;With armrests;With upper extremity assist Stand to Sit: 4: Min guard;To chair/3-in-1 Details for Transfer Assistance: Assist for power up and steadying.  Cueing for anterior weight shift .    Exercises    Balance   End of Session OT - End of Session Equipment Utilized During Treatment: Gait belt Activity Tolerance: Patient tolerated treatment well Patient left: in chair;with call bell/phone within reach  GO   07/12/2012 Cipriano Mile OTR/L Pager [phone] Office [phone]   Cipriano Mile 07/12/2012, 2:58 PM

## 2012-07-12 NOTE — Progress Notes (Signed)
Foley d/c'd at 0930

## 2012-07-13 LAB — CULTURE, BLOOD (ROUTINE X 2): Culture: NO GROWTH

## 2012-07-13 LAB — BASIC METABOLIC PANEL
BUN: 16 mg/dL (ref 6–23)
Chloride: 95 mEq/L — ABNORMAL LOW (ref 96–112)
Creatinine, Ser: 0.83 mg/dL (ref 0.50–1.35)
GFR calc Af Amer: >90 mL/min (ref >90)

## 2012-07-13 LAB — GLUCOSE, CAPILLARY
Glucose-Capillary: 102 mg/dL — ABNORMAL HIGH (ref 70–99)
Glucose-Capillary: 122 mg/dL — ABNORMAL HIGH (ref 70–99)

## 2012-07-13 NOTE — Progress Notes (Signed)
Physical Therapy Treatment Patient Details Name: [name] MRN: [id number] DOB: 10-16-1926 Today's Date: 07/13/2012 Time: 4098-1191 PT Time Calculation (min): 32 min  PT Assessment / Plan / Recommendation Comments on Treatment Session  Pt admitted with weakness and bil LE edema progressing with mobility but requires O2 with mobility to maintain sats in the 90s. Pt on RA on arrival with 91% with 16' ambulation on RA dropped to 85% with 2L o2 after 26' ambulation pt able to maintain 91%. HR 96-124 with activity today. Pt with decreased activity tolerance but progressing with transfers at bed and sit to stand and encouraged to continue HEP and mobility with nursing. Will continue to follow.     Follow Up Recommendations        Does the patient have the potential to tolerate intense rehabilitation     Barriers to Discharge        Equipment Recommendations       Recommendations for Other Services    Frequency     Plan Discharge plan remains appropriate;Frequency remains appropriate    Precautions / Restrictions Precautions Precautions: Fall   Pertinent Vitals/Pain No pain    Mobility  Bed Mobility Bed Mobility: Rolling Left;Left Sidelying to Sit;Sitting - Scoot to Edge of Bed Rolling Left: 5: Supervision;With rail Left Sidelying to Sit: 5: Supervision;HOB flat;With rails Sitting - Scoot to Edge of Bed: 5: Supervision Details for Bed Mobility Assistance: cueing for technique, sequence and use of rail with increased time to complete Transfers Sit to Stand: 4: Min assist;From chair/3-in-1;From toilet Stand to Sit: To chair/3-in-1;To toilet;4: Min assist Details for Transfer Assistance: cueing for hand placement, scooting to edge of surface and assist for anteior weight shift to clear buttock from surface Ambulation/Gait Ambulation/Gait Assistance: 4: Min guard Ambulation Distance (Feet): 26 Feet (16' x 2, then 26') Assistive device: Rolling walker Ambulation/Gait  Assistance Details: cueing for posture and position in RW, seated rest between 3 ambulation trials Gait Pattern: Step-through pattern;Decreased stride length;Trunk flexed Gait velocity: decreased Stairs: No    Exercises General Exercises - Lower Extremity Long Arc Quad: AROM;Both;15 reps;Seated Hip Flexion/Marching: AROM;Both;15 reps;Seated   PT Diagnosis:    PT Problem List:   PT Treatment Interventions:     PT Goals Acute Rehab PT Goals PT Goal: Supine/Side to Sit - Progress: Progressing toward goal PT Goal: Sit to Stand - Progress: Progressing toward goal PT Goal: Stand to Sit - Progress: Progressing toward goal PT Goal: Ambulate - Progress: Progressing toward goal PT Goal: Perform Home Exercise Program - Progress: Progressing toward goal  Visit Information  Last PT Received On: 07/13/12 Assistance Needed: +1    Subjective Data  Subjective: I just don't know if I can walk much today   Cognition  Cognition Overall Cognitive Status: Appears within functional limits for tasks assessed/performed Arousal/Alertness: Awake/alert Orientation Level: Appears intact for tasks assessed Behavior During Session: [facility] for tasks performed    Balance     End of Session PT - End of Session Equipment Utilized During Treatment: Gait belt;Oxygen Activity Tolerance: Patient tolerated treatment well Patient left: in chair;with call bell/phone within reach   GP     Toney Sang [facility] 07/13/2012, 11:10 AM Delaney Meigs, PT [phone]

## 2012-07-13 NOTE — Progress Notes (Signed)
[facility]  Subjective: No SOB.  IS having difficulty urinating.    Objective: Vital signs in last 24 hours: Temp:  [97.6 F (36.4 C)-99 F (37.2 C)] 97.6 F (36.4 C) (02/27 0412) Pulse Rate:  [80-102] 102 (02/27 0412) Resp:  [18-20] 20 (02/27 0412) BP: (99-118)/(49-55) 104/55 mmHg (02/27 0412) SpO2:  [90 %-98 %] 90 % (02/27 0412) Weight:  [101.061 kg (222 lb 12.8 oz)] 101.061 kg (222 lb 12.8 oz) (02/27 0412) Last BM Date: 07/11/12  Intake/Output from previous day: 02/26 0701 - 02/27 0700 In: 840 [P.O.:840] Out: 1600 [Urine:1600] Intake/Output this shift: Total I/O In: 240 [P.O.:240] Out: -   Medications Current Facility-Administered Medications  Medication Dose Route Frequency Provider Last Rate Last Dose  . 0.9 %  sodium chloride infusion   Intravenous Continuous Wilburt Finlay, PA 10 mL/hr at 07/07/12 1735 10 mL/hr at 07/07/12 1735  . acetaminophen (TYLENOL) tablet 500 mg  500 mg Oral Q8H PRN Clydia Llano, MD   500 mg at 07/12/12 0125  . ALPRAZolam Prudy Feeler) tablet 0.25 mg  0.25 mg Oral TID PRN Abelino Derrick, PA      . amiodarone (PACERONE) tablet 400 mg  400 mg Oral BID Wilburt Finlay, PA   400 mg at 07/12/12 2229  . antiseptic oral rinse (BIOTENE) solution 15 mL  15 mL Mouth Rinse BID Chrystie Nose, MD   15 mL at 07/12/12 2237  . bumetanide (BUMEX) tablet 1 mg  1 mg Oral BID Marykay Lex, MD   1 mg at 07/12/12 1658  . digoxin (LANOXIN) tablet 0.25 mg  0.25 mg Oral Daily Marinda Elk, MD   0.25 mg at 07/12/12 0925  . gabapentin (NEURONTIN) capsule 600 mg  600 mg Oral TID Clydia Llano, MD   600 mg at 07/12/12 2229  . insulin aspart (novoLOG) injection 0-9 Units  0-9 Units Subcutaneous TID WC Clydia Llano, MD   1 Units at 07/12/12 1700  . metoprolol (LOPRESSOR) injection 10 mg  10 mg Intravenous Q6H PRN Marinda Elk, MD   10 mg at 07/07/12 1117  . ondansetron (ZOFRAN) tablet 4 mg  4 mg Oral Q6H PRN Clydia Llano, MD       Or  .  ondansetron (ZOFRAN) injection 4 mg  4 mg Intravenous Q6H PRN Clydia Llano, MD      . pantoprazole (PROTONIX) EC tablet 40 mg  40 mg Oral Q breakfast Clydia Llano, MD   40 mg at 07/13/12 0701  . polyethylene glycol (MIRALAX / GLYCOLAX) packet 17 g  17 g Oral Daily Clydia Llano, MD   17 g at 07/12/12 0927  . Rivaroxaban (XARELTO) tablet 20 mg  20 mg Oral Q supper Clydia Llano, MD   20 mg at 07/12/12 1658  . senna (SENOKOT) tablet 17.2 mg  2 tablet Oral Daily PRN Leda Gauze, NP   17.2 mg at 07/05/12 2252  . sodium chloride 0.9 % injection 3 mL  3 mL Intravenous Q12H Clydia Llano, MD   3 mL at 07/12/12 2229  . Tamsulosin HCl (FLOMAX) capsule 0.4 mg  0.4 mg Oral QPC breakfast Clydia Llano, MD   0.4 mg at 07/12/12 0927  . tiotropium (SPIRIVA) inhalation capsule 18 mcg  18 mcg Inhalation Daily Clydia Llano, MD   18 mcg at 07/12/12 1145  . traMADol (ULTRAM) tablet 50 mg  50 mg Oral Q8H PRN Clydia Llano, MD   50 mg at 07/12/12 2229  . zolpidem (  AMBIEN) tablet 5 mg  5 mg Oral QHS PRN Clydia Llano, MD   5 mg at 07/12/12 2229    PE: General appearance: alert, cooperative and no distress Lungs: clear to auscultation bilaterally Heart: irregularly irregular rhythm and 1/6 Sys MM Abdomen: Nontender.  +BS Extremities: 2+ LEE particularly in the feet Pulses: 2+ and symmetric Neurologic: Grossly normal  Lab Results:  No results found for this basename: WBC, HGB, HCT, PLT,  in the last 72 hours BMET  Recent Labs  07/11/12 0606 07/12/12 0535 07/13/12 0530  NA 139 139 138  K 3.6 3.5 3.6  CL 94* 96 95*  CO2 40* 39* 38*  GLUCOSE 98 96 97  BUN 14 15 16   CREATININE 0.82 0.81 0.83  CALCIUM 8.9 9.0 9.3    Assessment/Plan  Principal Problem:   Acute systolic CHF (congestive heart failure), NYHA class 3 -- Unclear etilogy (? Afib related) EF down from 60-70% to 40%. Active Problems:   HYPERLIPIDEMIA   Type II or unspecified type diabetes mellitus with other specified manifestations,  uncontrolled   HYPERTENSION   Paroxysmal Atrial fibrillation - admitted with RVR   Lower extremity edema   General weakness  Plan:  Will eventually be DCd to [facility].  Net fluids: - on PO Bumex.   UOP looks good.  He reports having difficulty this morning.  No abd pain.  Will continue to monitor.  UA negative for infection.  SCr stable.   Afib rate controlled.  BP Controlled and stable.  No room to add ACE/ARB.  Loading on Amio PO.  ? Timing to retry DCCV.   He can probably DC to SNF tomorrow.    LOS: 8 days    HAGER, BRYAN 07/13/2012 8:34 AM  I have seen and examined the patient along with HAGER, BRYAN, PA .  I have reviewed the chart, notes and new data.  I agree with PA 's note.  Key new complaints: no dyspnea lying completely flat in bed; was able to urinate sitting down without difficulty Key examination changes: JVP no more than 3 cm above sternal angle, clear lungs, irregular rhythm 90-100 Key new findings / data: creat stable; note enterococcus in urine, but no clinical UTI symptoms and UA almost normal.  PLAN: Would leave on current meds. Rate control is adequate, HF is compensated. I think it would be premature to cardiovert earlier than 1 month after amiodarone initiation. Until amiodarone levels fully established, risk of recurrence is very high. BP still a little too low for ACEi. Would not treat for what is probably a urinary colonization rather than true infection. Probably ready for DC in AM.  Thurmon Fair, MD, [facility] [phone] 07/13/2012, 10:44 AM

## 2012-07-13 NOTE — Progress Notes (Signed)
CSW spoke with patient's daughter [name]- patient and family have chosen [name] for patient's rehab stay.  Per MD note- anticipate tentative d/c tomorrow.  OK per facility, patient and daughter.  Blue Medicare Berkley Harvey has been obtained per Selena Batten, CM at [facility].  [name] [name]  [phone]

## 2012-07-14 LAB — GLUCOSE, CAPILLARY

## 2012-07-14 LAB — BASIC METABOLIC PANEL
BUN: 14 mg/dL (ref 6–23)
Calcium: 9.2 mg/dL (ref 8.4–10.5)
Creatinine, Ser: 0.76 mg/dL (ref 0.50–1.35)
GFR calc non Af Amer: 82 mL/min — ABNORMAL LOW (ref >90)
Glucose, Bld: 95 mg/dL (ref 70–99)
Sodium: 139 mEq/L (ref 135–145)

## 2012-07-14 MED ORDER — TRAMADOL HCL 50 MG PO TABS: 50.0000 mg | ORAL_TABLET | Freq: Three times a day (TID) | ORAL | Status: DC | PRN

## 2012-07-14 MED ORDER — DIGOXIN 250 MCG PO TABS: 0.2500 mg | ORAL_TABLET | Freq: Every day | ORAL | Status: DC

## 2012-07-14 MED ORDER — AMIODARONE HCL 400 MG PO TABS: 400.0000 mg | ORAL_TABLET | Freq: Every day | ORAL | Status: DC

## 2012-07-14 MED ORDER — BUMETANIDE 1 MG PO TABS: 1.0000 mg | ORAL_TABLET | Freq: Two times a day (BID) | ORAL | Status: DC

## 2012-07-14 MED ORDER — ZOLPIDEM TARTRATE 5 MG PO TABS: 5.0000 mg | ORAL_TABLET | Freq: Every evening | ORAL | Status: DC | PRN

## 2012-07-14 NOTE — Progress Notes (Signed)
[facility]  Subjective: No complaints.  Objective: Vital signs in last 24 hours: Temp:  [97.6 F (36.4 C)-98.8 F (37.1 C)] 97.9 F (36.6 C) (02/28 0921) Pulse Rate:  [71-95] 92 (02/28 0921) Resp:  [20] 20 (02/28 0921) BP: (93-120)/(37-57) 100/44 mmHg (02/28 0921) SpO2:  [92 %-98 %] 98 % (02/28 0921) Weight:  [101.243 kg (223 lb 3.2 oz)] 101.243 kg (223 lb 3.2 oz) (02/28 0510) Last BM Date: 07/11/12  Intake/Output from previous day: 02/27 0701 - 02/28 0700 In: 1080 [P.O.:1080] Out: 2255 [Urine:2255] Intake/Output this shift: Total I/O In: 240 [P.O.:240] Out: -   Medications Current Facility-Administered Medications  Medication Dose Route Frequency Provider Last Rate Last Dose  . 0.9 %  sodium chloride infusion   Intravenous Continuous Wilburt Finlay, PA 10 mL/hr at 07/07/12 1735 10 mL/hr at 07/07/12 1735  . acetaminophen (TYLENOL) tablet 500 mg  500 mg Oral Q8H PRN Clydia Llano, MD   500 mg at 07/12/12 0125  . ALPRAZolam Prudy Feeler) tablet 0.25 mg  0.25 mg Oral TID PRN Abelino Derrick, PA      . amiodarone (PACERONE) tablet 400 mg  400 mg Oral BID Wilburt Finlay, PA   400 mg at 07/14/12 0926  . antiseptic oral rinse (BIOTENE) solution 15 mL  15 mL Mouth Rinse BID Chrystie Nose, MD   15 mL at 07/14/12 0927  . bumetanide (BUMEX) tablet 1 mg  1 mg Oral BID Marykay Lex, MD   1 mg at 07/14/12 4098  . digoxin (LANOXIN) tablet 0.25 mg  0.25 mg Oral Daily Marinda Elk, MD   0.25 mg at 07/14/12 1191  . gabapentin (NEURONTIN) capsule 600 mg  600 mg Oral TID Clydia Llano, MD   600 mg at 07/14/12 0921  . insulin aspart (novoLOG) injection 0-9 Units  0-9 Units Subcutaneous TID WC Clydia Llano, MD   1 Units at 07/13/12 1120  . metoprolol (LOPRESSOR) injection 10 mg  10 mg Intravenous Q6H PRN Marinda Elk, MD   10 mg at 07/07/12 1117  . ondansetron (ZOFRAN) tablet 4 mg  4 mg Oral Q6H PRN Clydia Llano, MD       Or  . ondansetron (ZOFRAN) injection 4  mg  4 mg Intravenous Q6H PRN Clydia Llano, MD      . pantoprazole (PROTONIX) EC tablet 40 mg  40 mg Oral Q breakfast Clydia Llano, MD   40 mg at 07/14/12 0657  . polyethylene glycol (MIRALAX / GLYCOLAX) packet 17 g  17 g Oral Daily Clydia Llano, MD   17 g at 07/14/12 0927  . Rivaroxaban (XARELTO) tablet 20 mg  20 mg Oral Q supper Clydia Llano, MD   20 mg at 07/13/12 1647  . senna (SENOKOT) tablet 17.2 mg  2 tablet Oral Daily PRN Leda Gauze, NP   17.2 mg at 07/05/12 2252  . sodium chloride 0.9 % injection 3 mL  3 mL Intravenous Q12H Clydia Llano, MD   3 mL at 07/14/12 0927  . Tamsulosin HCl (FLOMAX) capsule 0.4 mg  0.4 mg Oral QPC breakfast Clydia Llano, MD   0.4 mg at 07/14/12 0921  . tiotropium (SPIRIVA) inhalation capsule 18 mcg  18 mcg Inhalation Daily Clydia Llano, MD   18 mcg at 07/13/12 1118  . traMADol (ULTRAM) tablet 50 mg  50 mg Oral Q8H PRN Clydia Llano, MD   50 mg at 07/13/12 1646  . zolpidem (AMBIEN) tablet 5 mg  5 mg  Oral QHS PRN Clydia Llano, MD   5 mg at 07/13/12 2248    PE: General appearance: alert, cooperative and no distress Lungs: clear to auscultation bilaterally Heart: irregularly irregular rhythm and 1/6 Systolic MM Extremities: 2+ LEE Pulses: 2+ and symmetric Skin: Warm and dry Neurologic: Grossly normal  Lab Results:  No results found for this basename: WBC, HGB, HCT, PLT,  in the last 72 hours BMET  Recent Labs  07/12/12 0535 07/13/12 0530 07/14/12 0640  NA 139 138 139  K 3.5 3.6 3.5  CL 96 95* 97  CO2 39* 38* 38*  GLUCOSE 96 97 95  BUN 15 16 14   CREATININE 0.81 0.83 0.76  CALCIUM 9.0 9.3 9.2   PT/INR    Assessment/Plan    Principal Problem:   Acute systolic CHF (congestive heart failure), NYHA class 3 -- Unclear etilogy (? Afib related) EF down from 60-70% to 40%. Active Problems:   HYPERLIPIDEMIA   Type II or unspecified type diabetes mellitus with other specified manifestations, uncontrolled   HYPERTENSION   Paroxysmal Atrial  fibrillation - admitted with RVR   Lower extremity edema   General weakness  Plan:  Net fluids -1.1L on 1mg  BID Bumex.  SCr stable.  AFIb with controlled rate.  On PO amiodarone 400mg  BID  times eight days.  Will DC to [facility] today.  Conitnue Amio and consider DCCV in 30 days.  Will arrange FU appt.   LOS: 9 days    Man Bonneau 07/14/2012 10:31 AM

## 2012-07-14 NOTE — Progress Notes (Signed)
Occupational Therapy Treatment Patient Details Name: [name] MRN: [id number] DOB: 28-Nov-1926 Today's Date: 07/14/2012 Time: 4098-1191 OT Time Calculation (min): 55 min  OT Assessment / Plan / Recommendation Comments on Treatment Session      Follow Up Recommendations       Barriers to Discharge       Equipment Recommendations       Recommendations for Other Services    Frequency Min 2X/week   Plan Discharge plan remains appropriate    Precautions / Restrictions Precautions Precautions: Fall   Pertinent Vitals/Pain No pain per pt.    ADL  Grooming: Performed;Shaving;Wash/dry hands;Wash/dry face Where Assessed - Grooming: Supported standing Toilet Transfer: Performed;Min Pension scheme manager Method: Other (comment) (ambulating) Acupuncturist: Regular height toilet Toileting - Clothing Manipulation and Hygiene: Performed;Minimal assistance Where Assessed - Toileting Clothing Manipulation and Hygiene: Sit on 3-in-1 or toilet;Lean right and/or left Transfers/Ambulation Related to ADLs: rolling walker ADL Comments: pt. able to tolerate but does not do well with instructions and cueing.  interupting throughout session for the "way were going to do this"    OT Diagnosis:    OT Problem List:   OT Treatment Interventions:     OT Goals ADL Goals ADL Goal: Grooming - Progress: Progressing toward goals ADL Goal: Toilet Transfer - Progress: Progressing toward goals ADL Goal: Toileting - Clothing Manipulation - Progress: Progressing toward goals ADL Goal: Toileting - Hygiene - Progress: Progressing toward goals Miscellaneous OT Goals OT Goal: Miscellaneous Goal #2 - Progress: Progressing toward goals  Visit Information  Last OT Received On: 07/14/12    Subjective Data  Subjective: "well i finally have some help, ive got things i want to do right now and you can help me"   Prior Functioning       Cognition  Cognition Overall Cognitive Status:  Appears within functional limits for tasks assessed/performed Area of Impairment: Safety/judgement Arousal/Alertness: Awake/alert Orientation Level: Appears intact for tasks assessed Behavior During Session: Agitated Safety/Judgement: Decreased safety judgement for tasks assessed    Mobility  Bed Mobility Sitting - Scoot to Edge of Bed: 4: Min assist Details for Bed Mobility Assistance: cues for tech. but pt. con't. to interupt and insist on therapist assistance while pt. rocks to a count of 3 to initiate movements Transfers Transfers: Sit to Stand;Stand to Sit Sit to Stand: 4: Min assist Stand to Sit: 4: Min assist;With armrests;To elevated surface;To chair/3-in-1;To toilet    Exercises      Balance     End of Session OT - End of Session Activity Tolerance: Patient tolerated treatment well Patient left: in chair;with call bell/phone within reach  GO     Robet Leu 07/14/2012, 9:50 AM

## 2012-07-14 NOTE — Discharge Summary (Signed)
Physician Discharge Summary  Patient ID: [name] MRN: [id number] DOB/AGE: 77-Dec-1928 77 y.o.  Admit date: 07/05/2012 Discharge date: 07/14/2012  Admission Diagnoses:  Acute systolic heart failure / Afib RVR  Discharge Diagnoses:  Principal Problem:   Acute systolic CHF (congestive heart failure), NYHA class 3 -- Unclear etilogy (? Afib related) EF down from 60-70% to 40%. Active Problems:   HYPERLIPIDEMIA   Type II or unspecified type diabetes mellitus with other specified manifestations, uncontrolled   HYPERTENSION   Paroxysmal Atrial fibrillation - admitted with RVR   Lower extremity edema   General weakness   Stage 1-2 sacral pressure ulcer.  Discharged Condition: stable  Hospital Course:    The patient is an 79 yo male with a history of Afib since October 2013, for which he has been taking Xarelto, obesity, HTN, asthma, CKD, Afib, GERD, CHF. Echo from Sept. 2013 showed an EF of 65-70% Peak PA pressure of , Moderate LVH.   The patient presented with weakness which resulted in him collapsing to the floor, in a fairly controlled manner, in his home while walking to the bedroom. He maintains DOE particularly with upper body exertion. He also reports LEE for the last twenty years, abd distention, but does not complain of N, V, CP, orthopnea, PND, ABD pain.   He was started on IV lasix drip at 10mg /hr. and IV diltiazem.  The lasix was then titrated down due to hypotension and the dilt was discontinued.  He diuresed nicely and to date net fluids are -15L.   He ultimately was changed to PO bumex.  SCr has been remarkably stable.  An attempt at cardioversion was made and unsuccessful.  He was then loaded with PO Amiodarone and given one bolus of 150mg  by IV.  Digoxin was also added.   After 30 days of continuous Amiodarone, we will consider repeating DCCV.   He also is noted to have a stage 2 sacral pressure ulcer. Treated with dressing, barrier cream and turning.  Enterococcus   Was noted in urine culture.  Repeat UA revealed few and rare bacteria.  The patient was asymptomatic.  The patient was seen by Dr. Rennis Golden who felt he was stable for DC to [facility]     Consults: None   Significant Diagnostic Studies:       Component Value Date/Time   WBC 4.9 07/06/2012 0440   RBC 3.22* 07/06/2012 0440   HGB 9.3* 07/06/2012 0440   HCT 28.9* 07/06/2012 0440   PLT 210 07/06/2012 0440   MCV 89.8 07/06/2012 0440   MCH 28.9 07/06/2012 0440   MCHC 32.2 07/06/2012 0440   RDW 16.7* 07/06/2012 0440   LYMPHSABS 1.8 07/05/2012 1036   MONOABS 1.2* 07/05/2012 1036   EOSABS 0.1 07/05/2012 1036   BASOSABS 0.0 07/05/2012 1036   BMET    Component Value Date/Time   NA 139 07/14/2012 0640   K 3.5 07/14/2012 0640   CL 97 07/14/2012 0640   CO2 38* 07/14/2012 0640   GLUCOSE 95 07/14/2012 0640   BUN 14 07/14/2012 0640   CREATININE 0.76 07/14/2012 0640   CREATININE 1.05 03/17/2012 1642   CALCIUM 9.2 07/14/2012 0640   GFRNONAA 82* 07/14/2012 0640   GFRAA >90 07/14/2012 0640     CHEST - 1 VIEW  Comparison: Chest x-ray of 07/05/2012  Findings: The degree of pulmonary vascular congestion has improved.  There is cardiomegaly present with a small left effusion remaining.  No skeletal abnormality is seen.  IMPRESSION:  Some improvement in edema. Small effusion remains on the left    Treatments: Lasix gtt, Bumex.  Discharge Exam: Blood pressure 100/44, pulse 92, temperature 97.9 F (36.6 C), temperature source Oral, resp. rate 20, height 5\' 9"  (1.753 m), weight 101.243 kg (223 lb 3.2 oz), SpO2 98.00%.   Disposition: 01-Home or Self Care      Discharge Orders   Future Appointments Provider Department Dept Phone   09/08/2012 9:30 AM Stacie Glaze, MD [facility] [place] [phone]   Future Orders Complete By Expires     Diet - low sodium heart healthy  As directed     Increase activity slowly  As directed         Medication List    STOP taking these medications        albuterol 108 (90 BASE) MCG/ACT inhaler  Commonly known as:  PROVENTIL HFA;VENTOLIN HFA     carvedilol 3.125 MG tablet  Commonly known as:  COREG     torsemide 20 MG tablet  Commonly known as:  DEMADEX      TAKE these medications       acetaminophen 500 MG tablet  Commonly known as:  TYLENOL  Take 500 mg by mouth every 8 (eight) hours as needed for pain (with tramadol). Takes with Tramadol     amiodarone 400 MG tablet  Commonly known as:  PACERONE  Take 1 tablet (400 mg total) by mouth daily.     bumetanide 1 MG tablet  Commonly known as:  BUMEX  Take 1 tablet (1 mg total) by mouth 2 (two) times daily.     CALCIUM 600+D 600-400 MG-UNIT per tablet  Generic drug:  Calcium Carbonate-Vitamin D  Take 1 tablet by mouth 3 (three) times a week.     diclofenac sodium 1 % Gel  Commonly known as:  VOLTAREN  Apply 2 g topically as needed (to affected area for pain).     digoxin 0.25 MG tablet  Commonly known as:  LANOXIN  Take 1 tablet (0.25 mg total) by mouth daily.     gabapentin 300 MG capsule  Commonly known as:  NEURONTIN  Take 2 capsules (600 mg total) by mouth 3 (three) times daily.     Ocuvite PreserVision Tabs  Take 2 tablets by mouth 2 (two) times daily.     omeprazole 20 MG capsule  Commonly known as:  PRILOSEC  Take 1 capsule (20 mg total) by mouth daily.     polyethylene glycol packet  Commonly known as:  MIRALAX / GLYCOLAX  Take 17 g by mouth daily.     Rivaroxaban 20 MG Tabs  Commonly known as:  XARELTO  Take 1 tablet (20 mg total) by mouth daily. Start on 9/27     rosuvastatin 20 MG tablet  Commonly known as:  CRESTOR  Take 20 mg by mouth once a week. On Fridays     senna 8.6 MG Tabs  Commonly known as:  SENOKOT  Take 1 tablet by mouth daily as needed. For constipation     tamsulosin 0.4 MG Caps  Commonly known as:  FLOMAX  Take 0.4 mg by mouth daily.     tiotropium 18 MCG inhalation capsule  Commonly known as:  SPIRIVA HANDIHALER  Place 1  capsule (18 mcg total) into inhaler and inhale daily.     traMADol 50 MG tablet  Commonly known as:  ULTRAM  Take 1 tablet (50 mg total) by mouth every 8 (eight) hours as  needed for pain. Takes with 500mg  tylenol     zolpidem 5 MG tablet  Commonly known as:  AMBIEN  Take 1 tablet (5 mg total) by mouth at bedtime as needed for sleep.       Follow-up Information   Follow up with Wilburt Finlay, PA On 07/19/2012. (11:20 AM)    Contact information:   [address] [place] [address] [phone]       Signed: Wilburt Finlay 07/14/2012, 1:26 PM

## 2012-07-14 NOTE — Progress Notes (Signed)
Pt. Seen and examined. Agree with the NP/[name] note as written.  Ok to dc to SNF today on current dose bumex. Follow-up appointment in 5-7 days.  Chrystie Nose, MD, [facility] Attending Cardiologist [facility]

## 2012-08-14 ENCOUNTER — Encounter: Payer: Self-pay | Admitting: Adult Health

## 2012-08-14 ENCOUNTER — Non-Acute Institutional Stay (SKILLED_NURSING_FACILITY): Payer: Medicare Other | Admitting: Adult Health

## 2012-08-14 DIAGNOSIS — M1990 Unspecified osteoarthritis, unspecified site: Secondary | ICD-10-CM

## 2012-08-14 DIAGNOSIS — J441 Chronic obstructive pulmonary disease with (acute) exacerbation: Secondary | ICD-10-CM

## 2012-08-14 DIAGNOSIS — J45901 Unspecified asthma with (acute) exacerbation: Secondary | ICD-10-CM

## 2012-08-14 DIAGNOSIS — I509 Heart failure, unspecified: Secondary | ICD-10-CM

## 2012-08-17 ENCOUNTER — Telehealth: Payer: Self-pay | Admitting: Internal Medicine

## 2012-08-17 DIAGNOSIS — I5021 Acute systolic (congestive) heart failure: Secondary | ICD-10-CM

## 2012-08-17 DIAGNOSIS — R262 Difficulty in walking, not elsewhere classified: Secondary | ICD-10-CM

## 2012-08-17 DIAGNOSIS — I509 Heart failure, unspecified: Secondary | ICD-10-CM

## 2012-08-17 NOTE — Telephone Encounter (Signed)
Talked with [facility] nurse and she checked t out and he is walking and is asymptomatic.  insturcted to call her if sh e starts having symptoms

## 2012-08-17 NOTE — Telephone Encounter (Signed)
Nurse wanting to inform that the pt fell back and hit his bottom last night at midnight. EMS was at house. Pt is still at home - no injuries. Notification only.

## 2012-08-18 ENCOUNTER — Telehealth: Payer: Self-pay | Admitting: Internal Medicine

## 2012-08-18 ENCOUNTER — Telehealth: Payer: Self-pay | Admitting: *Deleted

## 2012-08-18 NOTE — Telephone Encounter (Signed)
Olegario Messier called with some concerns about patient I called her back and left a message. Gani from [facility] also called and stated that they will not be seeing patient for occupational therapy.  Due to patient not needing their services. His number is [phone].

## 2012-08-18 NOTE — Telephone Encounter (Signed)
[name] is calling concerning computer is stating pt has 2 meds that are interacting.

## 2012-08-18 NOTE — Telephone Encounter (Signed)
Talked with nurse- it is digoxin and amioderone  , and voltaren gel and xarelto. Per dr Lovell Sheehan- ok to take

## 2012-08-22 ENCOUNTER — Other Ambulatory Visit: Payer: Self-pay | Admitting: *Deleted

## 2012-08-22 MED ORDER — PETROLATUM-ZINC OXIDE 49-15 % EX OINT: 1.0000 "application " | TOPICAL_OINTMENT | Freq: Three times a day (TID) | CUTANEOUS | Status: DC

## 2012-08-27 ENCOUNTER — Other Ambulatory Visit: Payer: Self-pay | Admitting: *Deleted

## 2012-08-28 NOTE — Addendum Note (Signed)
Addended by: Chrystie Nose on: 08/28/2012 12:36 PM   Modules accepted: Orders

## 2012-08-30 ENCOUNTER — Encounter (HOSPITAL_COMMUNITY): Payer: Self-pay | Admitting: Certified Registered"

## 2012-08-30 ENCOUNTER — Encounter (HOSPITAL_COMMUNITY)
Admission: RE | Disposition: A | Discharge status: Home / Self Care | Payer: Self-pay | Source: Ambulatory Visit | Attending: Internal Medicine

## 2012-08-30 ENCOUNTER — Encounter (HOSPITAL_COMMUNITY): Payer: Self-pay | Admitting: *Deleted

## 2012-08-30 ENCOUNTER — Ambulatory Visit (HOSPITAL_COMMUNITY)
Admission: RE | Admit: 2012-08-30 | Discharge: 2012-08-30 | Disposition: A | Discharge status: Home / Self Care | Payer: Medicare Other | Source: Ambulatory Visit | Attending: Internal Medicine | Admitting: Internal Medicine

## 2012-08-30 ENCOUNTER — Ambulatory Visit (HOSPITAL_COMMUNITY): Payer: Medicare Other | Admitting: Certified Registered"

## 2012-08-30 DIAGNOSIS — I10 Essential (primary) hypertension: Secondary | ICD-10-CM | POA: Insufficient documentation

## 2012-08-30 DIAGNOSIS — Z886 Allergy status to analgesic agent status: Secondary | ICD-10-CM | POA: Insufficient documentation

## 2012-08-30 DIAGNOSIS — Z7901 Long term (current) use of anticoagulants: Secondary | ICD-10-CM | POA: Insufficient documentation

## 2012-08-30 DIAGNOSIS — I4891 Unspecified atrial fibrillation: Secondary | ICD-10-CM | POA: Insufficient documentation

## 2012-08-30 DIAGNOSIS — E785 Hyperlipidemia, unspecified: Secondary | ICD-10-CM | POA: Insufficient documentation

## 2012-08-30 DIAGNOSIS — I509 Heart failure, unspecified: Secondary | ICD-10-CM | POA: Insufficient documentation

## 2012-08-30 DIAGNOSIS — I428 Other cardiomyopathies: Secondary | ICD-10-CM | POA: Insufficient documentation

## 2012-08-30 DIAGNOSIS — I5020 Unspecified systolic (congestive) heart failure: Secondary | ICD-10-CM | POA: Insufficient documentation

## 2012-08-30 DIAGNOSIS — Z885 Allergy status to narcotic agent status: Secondary | ICD-10-CM | POA: Insufficient documentation

## 2012-08-30 DIAGNOSIS — E119 Type 2 diabetes mellitus without complications: Secondary | ICD-10-CM | POA: Insufficient documentation

## 2012-08-30 DIAGNOSIS — Z79899 Other long term (current) drug therapy: Secondary | ICD-10-CM | POA: Insufficient documentation

## 2012-08-30 DIAGNOSIS — Z9181 History of falling: Secondary | ICD-10-CM | POA: Insufficient documentation

## 2012-08-30 DIAGNOSIS — Z791 Long term (current) use of non-steroidal anti-inflammatories (NSAID): Secondary | ICD-10-CM | POA: Insufficient documentation

## 2012-08-30 HISTORY — PX: CARDIOVERSION: SHX1299

## 2012-08-30 LAB — BASIC METABOLIC PANEL
Chloride: 97 mEq/L (ref 96–112)
Creatinine, Ser: 0.96 mg/dL (ref 0.50–1.35)
GFR calc Af Amer: 86 mL/min — ABNORMAL LOW (ref >90)
GFR calc non Af Amer: 75 mL/min — ABNORMAL LOW (ref >90)

## 2012-08-30 SURGERY — CARDIOVERSION
Anesthesia: Monitor Anesthesia Care

## 2012-08-30 MED ORDER — LIDOCAINE HCL (CARDIAC) 20 MG/ML IV SOLN
INTRAVENOUS | Status: DC | PRN
  Administered 2012-08-30: 60 mg via INTRAVENOUS

## 2012-08-30 MED ORDER — SODIUM CHLORIDE 0.9 % IV SOLN
INTRAVENOUS | Status: DC
  Administered 2012-08-30: 11:00:00 via INTRAVENOUS

## 2012-08-30 MED ORDER — PROPOFOL 10 MG/ML IV BOLUS
INTRAVENOUS | Status: DC | PRN
  Administered 2012-08-30: 60 mg via INTRAVENOUS

## 2012-08-30 NOTE — CV Procedure (Signed)
[facility]  CARDIOVERSION NOTE   Procedure: Electrical Cardioversion Indications:  Atrial Fibrillation  Procedure Details:  Consent: Risks of procedure as well as the alternatives and risks of each were explained to the (patient/caregiver).  Consent for procedure obtained.  Time Out: Verified patient identification, verified procedure, site/side was marked, verified correct patient position, special equipment/implants available, medications/allergies/relevent history reviewed, required imaging and test results available.  Performed  He has been fully anticoagulated on xarelto for 1 month.  Prior hospital DCCV was not successful - then he was loaded on amiodarone and has been on that for 1 month.  Patient placed on cardiac monitor, pulse oximetry, supplemental oxygen as necessary.  Sedation given: Propofol per anesthesia Pacer pads placed anterior and posterior chest.  Cardioverted 2 time(s).  Cardioverted at 120J biphasic and 150J biphasic.  Impression: Findings: Post procedure EKG shows: NSR Complications: None Patient did tolerate procedure well.  Time Spent Directly with the Patient:  15 minutes   Chrystie Nose, MD, [facility] Attending Cardiologist [facility]  Kiyan Burmester C 08/30/2012, 11:18 AM

## 2012-08-30 NOTE — Transfer of Care (Signed)
Immediate Anesthesia Transfer of Care Note  Patient: [name]  Procedure(s) Performed: Procedure(s): CARDIOVERSION (N/A)  Patient Location: PACU  Anesthesia Type:General  Level of Consciousness: awake, alert  and oriented  Airway & Oxygen Therapy: Patient connected to nasal cannula oxygen  Post-op Assessment: Report given to PACU RN  Post vital signs: stable  Complications: No apparent anesthesia complications

## 2012-08-30 NOTE — H&P (Signed)
     [facility]          INTERVAL PROCEDURE H&P   History and Physical Interval Note:  08/30/2012 10:19 AM  [name] has presented today for their planned procedure. The various methods of treatment have been discussed with the patient and family. After consideration of risks, benefits and other options for treatment, the patient has consented to the procedure.  The patients' outpatient history has been reviewed, patient examined, and no change in status from most recent office note within the past 30 days. I have reviewed the patients' chart and labs and will proceed as planned. Questions were answered to the patient's satisfaction.   Chrystie Nose, MD, [facility] Attending Cardiologist [facility]  [name] 08/30/2012, 10:19 AM

## 2012-08-30 NOTE — Preoperative (Signed)
Beta Blockers   Reason not to administer Beta Blockers:Not Applicable 

## 2012-08-30 NOTE — Addendum Note (Signed)
Addendum created 08/30/12 1151 by Ellin Goodie, CRNA   Modules edited: Anesthesia Medication Administration

## 2012-08-30 NOTE — Anesthesia Preprocedure Evaluation (Addendum)
Anesthesia Evaluation  Patient identified by MRN, date of birth, ID band Patient awake    Reviewed: Allergy & Precautions, H&P , NPO status , Patient's Chart, lab work & pertinent test results, reviewed documented beta blocker date and time   History of Anesthesia Complications Negative for: history of anesthetic complications  Airway Mallampati: II TM Distance: >3 FB Neck ROM: Full    Dental  (+) Teeth Intact and Dental Advisory Given   Pulmonary shortness of breath and with exertion, asthma , former smoker,    Pulmonary exam normal       Cardiovascular hypertension, Pt. on medications +CHF + dysrhythmias Atrial Fibrillation     Neuro/Psych negative neurological ROS  negative psych ROS   GI/Hepatic GERD-  Medicated,  Endo/Other  diabetes  Renal/GU Renal InsufficiencyRenal disease     Musculoskeletal   Abdominal   Peds  Hematology   Anesthesia Other Findings EF 40%  Reproductive/Obstetrics                         Anesthesia Physical Anesthesia Plan  ASA: III  Anesthesia Plan: General   Post-op Pain Management:    Induction: Intravenous  Airway Management Planned: Mask  Additional Equipment:   Intra-op Plan:   Post-operative Plan:   Informed Consent: I have reviewed the patients History and Physical, chart, labs and discussed the procedure including the risks, benefits and alternatives for the proposed anesthesia with the patient or authorized representative who has indicated his/her understanding and acceptance.   Dental advisory given  Plan Discussed with: CRNA, Anesthesiologist and Surgeon  Anesthesia Plan Comments:        Anesthesia Quick Evaluation

## 2012-08-30 NOTE — Anesthesia Postprocedure Evaluation (Signed)
  Anesthesia Post-op Note  Patient: [name]  Procedure(s) Performed: Procedure(s): CARDIOVERSION (N/A)  Patient Location: PACU and Endoscopy Unit  Anesthesia Type:General  Level of Consciousness: awake, alert  and oriented  Airway and Oxygen Therapy: Patient connected to nasal cannula oxygen  Post-op Pain: none  Post-op Assessment: Post-op Vital signs reviewed  Post-op Vital Signs: stable  Complications: No apparent anesthesia complications

## 2012-08-31 ENCOUNTER — Encounter (HOSPITAL_COMMUNITY): Payer: Self-pay | Admitting: Internal Medicine

## 2012-09-04 ENCOUNTER — Encounter: Payer: Self-pay | Admitting: Internal Medicine

## 2012-09-08 ENCOUNTER — Encounter: Payer: Self-pay | Admitting: Internal Medicine

## 2012-09-08 ENCOUNTER — Ambulatory Visit (INDEPENDENT_AMBULATORY_CARE_PROVIDER_SITE_OTHER): Payer: Medicare Other | Admitting: Internal Medicine

## 2012-09-08 VITALS — BP 102/64 | HR 76 | Temp 98.3°F | Resp 16 | Ht 70.0 in | Wt 204.0 lb

## 2012-09-08 DIAGNOSIS — R600 Localized edema: Secondary | ICD-10-CM

## 2012-09-08 DIAGNOSIS — E785 Hyperlipidemia, unspecified: Secondary | ICD-10-CM

## 2012-09-08 DIAGNOSIS — G4700 Insomnia, unspecified: Secondary | ICD-10-CM

## 2012-09-08 DIAGNOSIS — I10 Essential (primary) hypertension: Secondary | ICD-10-CM

## 2012-09-08 DIAGNOSIS — I4891 Unspecified atrial fibrillation: Secondary | ICD-10-CM

## 2012-09-08 DIAGNOSIS — R609 Edema, unspecified: Secondary | ICD-10-CM

## 2012-09-08 DIAGNOSIS — M1710 Unilateral primary osteoarthritis, unspecified knee: Secondary | ICD-10-CM

## 2012-09-08 MED ORDER — ZOLPIDEM TARTRATE 5 MG PO TABS: 5.0000 mg | ORAL_TABLET | Freq: Every evening | ORAL | Status: DC | PRN

## 2012-09-08 MED ORDER — TRAMADOL HCL 50 MG PO TABS: 50.0000 mg | ORAL_TABLET | Freq: Three times a day (TID) | ORAL | Status: DC | PRN

## 2012-09-08 MED ORDER — RIVAROXABAN 20 MG PO TABS: 20.0000 mg | ORAL_TABLET | Freq: Every day | ORAL | Status: DC

## 2012-09-08 NOTE — Progress Notes (Signed)
Subjective:    Patient ID: [name], male    DOB: 09-29-1926, 79 y.o.   MRN: [id number]  HPI Patient is an 79 year old male who is followed for  atrial fibrillation recent cardioversion and hypertension.  He lost significant weight I suspect that that will be reflected in better laboratory parameters on his diabetes his atrial fibrillation was treated with cardioversion and he was in sinus rhythm today.  His blood pressure is well controlled   Review of Systems  Constitutional: Negative for fever and fatigue.  HENT: Positive for congestion. Negative for hearing loss, neck pain and postnasal drip.   Eyes: Negative for discharge, redness and visual disturbance.  Respiratory: Negative for cough, shortness of breath and wheezing.   Cardiovascular: Positive for leg swelling. Negative for palpitations.  Gastrointestinal: Negative for abdominal pain, constipation and abdominal distention.  Endocrine: Positive for polyuria.  Genitourinary: Positive for frequency. Negative for urgency.  Musculoskeletal: Negative for joint swelling and arthralgias.  Skin: Negative for color change and rash.  Neurological: Positive for weakness. Negative for light-headedness.  Hematological: Negative for adenopathy.  Psychiatric/Behavioral: Negative for behavioral problems.   Past Medical History  Diagnosis Date  . Obesity   . Hypertension   . Edema   . BPH (benign prostatic hypertrophy)   . Asthma   . Bronchospasm, exercise-induced   . Chronic kidney disease   . Arthritis   . CHF (congestive heart failure)   . GERD (gastroesophageal reflux disease)   . A-fib   . Shortness of breath     History   Social History  . Marital Status: Widowed    Spouse Name: N/A    Number of Children: N/A  . Years of Education: N/A   Occupational History  . retired    Social History Main Topics  . Smoking status: Former Smoker    Quit date: 05/17/1964  . Smokeless tobacco: Never Used  . Alcohol  Use: No  . Drug Use: No  . Sexually Active: Not Currently   Other Topics Concern  . Not on file   Social History Narrative  . No narrative on file    Past Surgical History  Procedure Laterality Date  . Vascular surgery  removed varicose veins  . Vasectomy    . Cardioversion N/A 07/07/2012    Procedure: CARDIOVERSION;  Surgeon: Chrystie Nose, MD;  Location: [facility] ENDOSCOPY;  Service: Cardiovascular;  Laterality: N/A;  . Cardioversion N/A 08/30/2012    Procedure: CARDIOVERSION;  Surgeon: Chrystie Nose, MD;  Location: [facility] ENDOSCOPY;  Service: Cardiovascular;  Laterality: N/A;    Family History  Problem Relation Age of Onset  . Alzheimer's disease Mother   . Heart disease Father     Allergies  Allergen Reactions  . Nsaids   . Other     Opiates cause tightness in chest  . Vicodin (Hydrocodone-Acetaminophen) Other (See Comments)    Unknown reaction many years ago  . Morphine And Related Nausea And Vomiting    Current Outpatient Prescriptions on File Prior to Visit  Medication Sig Dispense Refill  . acetaminophen (TYLENOL) 500 MG tablet Take 500 mg by mouth every 8 (eight) hours as needed for pain (with tramadol). Takes with Tramadol      . amiodarone (PACERONE) 400 MG tablet Take 1 tablet (400 mg total) by mouth daily.  30 tablet  5  . bumetanide (BUMEX) 1 MG tablet Take 1 mg by mouth 2 (two) times daily. Take 1 mg daily on m-w-f and twice daily  tues; thurs; sat sun      . Calcium Carbonate-Vitamin D (CALCIUM 600+D) 600-400 MG-UNIT per tablet Take 1 tablet by mouth 3 (three) times a week.       . diclofenac sodium (VOLTAREN) 1 % GEL Apply 2 g topically as needed (to affected area for pain).      [name] digoxin (LANOXIN) 0.25 MG tablet Take 1 tablet (0.25 mg total) by mouth daily.  30 tablet  5  . gabapentin (NEURONTIN) 300 MG capsule Take 2 capsules (600 mg total) by mouth 3 (three) times daily.  540 capsule  3  . Multiple Vitamins-Minerals (OCUVITE PRESERVISION) TABS Take 2  tablets by mouth 2 (two) times daily.       [name] omeprazole (PRILOSEC) 20 MG capsule Take 20 mg by mouth 2 (two) times daily.      [name] Petrolatum-Zinc Oxide (SENSI-CARE PROTECTIVE BARRIER) 49-15 % OINT Apply 1 application topically 3 (three) times daily.  113 g  6  . Rivaroxaban (XARELTO) 20 MG TABS Take 1 tablet (20 mg total) by mouth daily. Start on 9/27  30 tablet  3  . rosuvastatin (CRESTOR) 20 MG tablet Take 20 mg by mouth once a week. On Fridays      . Tamsulosin HCl (FLOMAX) 0.4 MG CAPS Take 0.4 mg by mouth daily.      [name] tiotropium (SPIRIVA HANDIHALER) 18 MCG inhalation capsule Place 1 capsule (18 mcg total) into inhaler and inhale daily.  30 capsule  12  . traMADol (ULTRAM) 50 MG tablet Take 1 tablet (50 mg total) by mouth every 8 (eight) hours as needed for pain. Takes with 500mg  tylenol  30 tablet  5  . zolpidem (AMBIEN) 5 MG tablet Take 1 tablet (5 mg total) by mouth at bedtime as needed for sleep.  30 tablet  5   No current facility-administered medications on file prior to visit.    BP 102/64  Pulse 76  Temp(Src) 98.3 F (36.8 C)  Resp 16  Ht 5\' 10"  (1.778 m)  Wt 204 lb (92.534 kg)  BMI 29.27 kg/m2       Objective:   Physical Exam  Constitutional: He appears well-developed and well-nourished.  HENT:  Head: Normocephalic and atraumatic.  Eyes: Conjunctivae are normal. Pupils are equal, round, and reactive to light.  Neck: Normal range of motion. Neck supple.  Cardiovascular: Normal rate and regular rhythm.   Murmur heard. edema  Pulmonary/Chest: Effort normal. He has wheezes.  Abdominal: Soft. Bowel sounds are normal. He exhibits distension.  Musculoskeletal: He exhibits edema.          Assessment & Plan:  Stable blood pressure repeat blood pressure was 120/64 pulse is stable in sinus rhythm weight reduction is planned a combination of dietary change as well as the addition of a diuretic for lower extremity dependent edema.  I suspect that the blood sugar  elevation was secondary to the weight gain and edema and the heart disease now that he has stabilized his diet I think he is controlled by diet alone we'll confirm that with a basic metabolic panel today

## 2012-09-08 NOTE — Patient Instructions (Addendum)
Continue the stocking Referred to  [facility] for knees

## 2012-10-04 ENCOUNTER — Other Ambulatory Visit: Payer: Self-pay | Admitting: *Deleted

## 2012-10-04 DIAGNOSIS — M1710 Unilateral primary osteoarthritis, unspecified knee: Secondary | ICD-10-CM

## 2012-10-04 DIAGNOSIS — R600 Localized edema: Secondary | ICD-10-CM

## 2012-10-04 MED ORDER — TRAMADOL HCL 50 MG PO TABS: 50.0000 mg | ORAL_TABLET | Freq: Three times a day (TID) | ORAL | Status: DC | PRN

## 2012-10-25 ENCOUNTER — Other Ambulatory Visit: Payer: Self-pay | Admitting: Internal Medicine

## 2012-11-01 NOTE — Progress Notes (Signed)
Patient ID: [name], male   DOB: 10-May-1927, 79 y.o.   MRN: [id number]  [facility]  Allergies  Allergen Reactions  . Nsaids   . Other     Opiates cause tightness in chest  . Vicodin (Hydrocodone-Acetaminophen) Other (See Comments)    Unknown reaction many years ago  . Morphine And Related Nausea And Vomiting    Chief Complaint  Patient presents with  . Medical Managment of Chronic Issues    discharge note    HPI: He is being discharged to home with home health for pt/ot/nursing. He will need rolling walker in order to promote his independence with his adl's due to his chf. He was admitted to this facility for short time rehab and is ready to go home at this time.   Past Medical History  Diagnosis Date  . Obesity   . Hypertension   . Edema   . BPH (benign prostatic hypertrophy)   . Asthma   . Bronchospasm, exercise-induced   . Chronic kidney disease   . Arthritis   . CHF (congestive heart failure)   . GERD (gastroesophageal reflux disease)   . A-fib   . Shortness of breath     Past Surgical History  Procedure Laterality Date  . Vascular surgery  removed varicose veins  . Vasectomy    . Cardioversion N/A 07/07/2012    Procedure: CARDIOVERSION;  Surgeon: Chrystie Nose, MD;  Location: [facility] ENDOSCOPY;  Service: Cardiovascular;  Laterality: N/A;  . Cardioversion N/A 08/30/2012    Procedure: CARDIOVERSION;  Surgeon: Chrystie Nose, MD;  Location: [facility] ENDOSCOPY;  Service: Cardiovascular;  Laterality: N/A;    VITAL SIGNS BP 146/66  Pulse 58  Ht 5\' 10"  (1.778 m)  Wt 155 lb (70.308 kg)  BMI 22.24 kg/m2   Patient's Medications  New Prescriptions   PETROLATUM-ZINC OXIDE (SENSI-CARE PROTECTIVE BARRIER) 49-15 % OINT    Apply 1 application topically 3 (three) times daily.  Previous Medications   ACETAMINOPHEN (TYLENOL) 500 MG TABLET    Take 500 mg by mouth every 8 (eight) hours as needed for pain (with tramadol). Takes with Tramadol   AMIODARONE (PACERONE) 400  MG TABLET    Take 1 tablet (400 mg total) by mouth daily.   CALCIUM CARBONATE-VITAMIN D (CALCIUM 600+D) 600-400 MG-UNIT PER TABLET    Take 1 tablet by mouth 3 (three) times a week.    DICLOFENAC SODIUM (VOLTAREN) 1 % GEL    Apply 2 g topically as needed (to affected area for pain).   DIGOXIN (LANOXIN) 0.25 MG TABLET    Take 1 tablet (0.25 mg total) by mouth daily.   GABAPENTIN (NEURONTIN) 300 MG CAPSULE    Take 2 capsules (600 mg total) by mouth 3 (three) times daily.   MULTIPLE VITAMINS-MINERALS (OCUVITE PRESERVISION) TABS    Take 2 tablets by mouth 2 (two) times daily.    ROSUVASTATIN (CRESTOR) 20 MG TABLET    Take 20 mg by mouth once a week. On Fridays   TAMSULOSIN HCL (FLOMAX) 0.4 MG CAPS    Take 0.4 mg by mouth daily.   TIOTROPIUM (SPIRIVA HANDIHALER) 18 MCG INHALATION CAPSULE    Place 1 capsule (18 mcg total) into inhaler and inhale daily.  Modified Medications   Modified Medication Previous Medication   BUMETANIDE (BUMEX) 1 MG TABLET bumetanide (BUMEX) 1 MG tablet      Take 1 mg by mouth 2 (two) times daily. Take 1 mg daily on m-w-f and twice daily tues; thurs;  sat sun    Take 1 tablet (1 mg total) by mouth 2 (two) times daily.   OMEPRAZOLE (PRILOSEC) 20 MG CAPSULE omeprazole (PRILOSEC) 20 MG capsule      Take 20 mg by mouth 2 (two) times daily.    Take 1 capsule (20 mg total) by mouth daily.   OMEPRAZOLE (PRILOSEC) 20 MG CAPSULE omeprazole (PRILOSEC) 20 MG capsule      TAKE ONE CAPSULE BY MOUTH EVERY DAY    Take 1 capsule (20 mg total) by mouth daily.   RIVAROXABAN (XARELTO) 20 MG TABS Rivaroxaban (XARELTO) 20 MG TABS      Take 1 tablet (20 mg total) by mouth daily. Start on 9/27    Take 1 tablet (20 mg total) by mouth daily. Start on 9/27   TRAMADOL (ULTRAM) 50 MG TABLET traMADol (ULTRAM) 50 MG tablet      Take 1 tablet (50 mg total) by mouth every 8 (eight) hours as needed for pain. Takes with 500mg  tylenol    Take 1 tablet (50 mg total) by mouth every 8 (eight) hours as needed for  pain. Takes with 500mg  tylenol   ZOLPIDEM (AMBIEN) 5 MG TABLET zolpidem (AMBIEN) 5 MG tablet      Take 1 tablet (5 mg total) by mouth at bedtime as needed for sleep.    Take 1 tablet (5 mg total) by mouth at bedtime as needed for sleep.  Discontinued Medications   POLYETHYLENE GLYCOL (MIRALAX / GLYCOLAX) PACKET    Take 17 g by mouth daily.   SENNA (SENOKOT) 8.6 MG TABS    Take 1 tablet by mouth daily as needed. For constipation   TRAMADOL (ULTRAM) 50 MG TABLET    Take 1 tablet (50 mg total) by mouth every 8 (eight) hours as needed for pain. Takes with 500mg  tylenol    SIGNIFICANT DIAGNOSTIC EXAMS  07-28-12: wbc 4.6; hgb 10.8; hct 33.0; mcv 87.1; plt 274; glucose 137; bun 16; creat 0.91; k+ 4.0 Na++ 139    Review of Systems  Constitutional: Negative for malaise/fatigue.  Respiratory: Negative for cough and shortness of breath.   Cardiovascular: Negative for chest pain.  Gastrointestinal: Negative for heartburn, abdominal pain and constipation.  Musculoskeletal: Negative for myalgias and back pain.  Skin: Negative.   Neurological: Negative for headaches.  Psychiatric/Behavioral: Negative for depression. The patient does not have insomnia.     Physical Exam  Constitutional: He appears well-nourished.  Neck: Neck supple. No JVD present. No thyromegaly present.  Cardiovascular: Normal rate, regular rhythm and intact distal pulses.   Respiratory: Effort normal and breath sounds normal. No respiratory distress.  GI: Soft. Bowel sounds are normal. He exhibits no distension. There is no tenderness.  Musculoskeletal: Normal range of motion. He exhibits no edema.  Neurological: He is alert.  Skin: Skin is warm and dry.      ASSESSMENT/ PLAN:  Chf; osteoarthritis; copd  Will discharge him to home with home health for po/ot/nursing; will need a rolling walker. Will ned prescriptions written.     Time spent with patient 40 minutes.

## 2012-11-13 ENCOUNTER — Other Ambulatory Visit: Payer: Self-pay | Admitting: *Deleted

## 2012-11-13 ENCOUNTER — Encounter: Payer: Self-pay | Admitting: Family

## 2012-11-13 ENCOUNTER — Ambulatory Visit (INDEPENDENT_AMBULATORY_CARE_PROVIDER_SITE_OTHER): Payer: Medicare Other | Admitting: Family

## 2012-11-13 VITALS — BP 142/60 | HR 77 | Wt 199.0 lb

## 2012-11-13 DIAGNOSIS — R600 Localized edema: Secondary | ICD-10-CM

## 2012-11-13 DIAGNOSIS — M1710 Unilateral primary osteoarthritis, unspecified knee: Secondary | ICD-10-CM

## 2012-11-13 DIAGNOSIS — N6320 Unspecified lump in the left breast, unspecified quadrant: Secondary | ICD-10-CM

## 2012-11-13 DIAGNOSIS — I480 Paroxysmal atrial fibrillation: Secondary | ICD-10-CM

## 2012-11-13 DIAGNOSIS — N63 Unspecified lump in unspecified breast: Secondary | ICD-10-CM

## 2012-11-13 DIAGNOSIS — I4891 Unspecified atrial fibrillation: Secondary | ICD-10-CM

## 2012-11-13 MED ORDER — TRAMADOL HCL 50 MG PO TABS: 50.0000 mg | ORAL_TABLET | Freq: Three times a day (TID) | ORAL | Status: DC | PRN

## 2012-11-13 NOTE — Progress Notes (Signed)
Subjective:    Patient ID: [name], male    DOB: 10-28-1926, 79 y.o.   MRN: [id number]  HPI  79 year old white male, nonsmoker, patient of Dr. Lovell Sheehan is in today with complaints of a left breast mass x2 months. Reports extreme sensitivity to the nipple over the last couple weeks that appears to be better today. Denies any drainage or discharge from the nipple. No skin changes. Reports having a mass to the left breast approximately 2 years ago that was benign. Denies any family history of any known breast cancers.  Review of Systems  Constitutional: Negative.   Respiratory: Negative.   Cardiovascular: Negative.   Gastrointestinal: Negative.   Endocrine: Negative.   Genitourinary: Negative.   Skin:       Left breast mass under the nipple  Neurological: Negative.   Psychiatric/Behavioral: Negative.    Past Medical History  Diagnosis Date  . Obesity   . Hypertension   . Edema   . BPH (benign prostatic hypertrophy)   . Asthma   . Bronchospasm, exercise-induced   . Chronic kidney disease   . Arthritis   . CHF (congestive heart failure)   . GERD (gastroesophageal reflux disease)   . A-fib   . Shortness of breath     History   Social History  . Marital Status: Widowed    Spouse Name: N/A    Number of Children: N/A  . Years of Education: N/A   Occupational History  . retired    Social History Main Topics  . Smoking status: Former Smoker    Quit date: 05/17/1964  . Smokeless tobacco: Never Used  . Alcohol Use: No  . Drug Use: No  . Sexually Active: Not Currently   Other Topics Concern  . Not on file   Social History Narrative  . No narrative on file    Past Surgical History  Procedure Laterality Date  . Vascular surgery  removed varicose veins  . Vasectomy    . Cardioversion N/A 07/07/2012    Procedure: CARDIOVERSION;  Surgeon: Chrystie Nose, MD;  Location: [facility] ENDOSCOPY;  Service: Cardiovascular;  Laterality: N/A;  . Cardioversion N/A 08/30/2012      Procedure: CARDIOVERSION;  Surgeon: Chrystie Nose, MD;  Location: [facility] ENDOSCOPY;  Service: Cardiovascular;  Laterality: N/A;    Family History  Problem Relation Age of Onset  . Alzheimer's disease Mother   . Heart disease Father     Allergies  Allergen Reactions  . Nsaids   . Other     Opiates cause tightness in chest  . Vicodin (Hydrocodone-Acetaminophen) Other (See Comments)    Unknown reaction many years ago  . Morphine And Related Nausea And Vomiting    Current Outpatient Prescriptions on File Prior to Visit  Medication Sig Dispense Refill  . acetaminophen (TYLENOL) 500 MG tablet Take 500 mg by mouth every 8 (eight) hours as needed for pain (with tramadol). Takes with Tramadol      . amiodarone (PACERONE) 400 MG tablet Take 1 tablet (400 mg total) by mouth daily.  30 tablet  5  . bumetanide (BUMEX) 1 MG tablet Take 1 mg by mouth 2 (two) times daily. Take 1 mg daily on m-w-f and twice daily tues; thurs; sat sun      . Calcium Carbonate-Vitamin D (CALCIUM 600+D) 600-400 MG-UNIT per tablet Take 1 tablet by mouth 3 (three) times a week.       . diclofenac sodium (VOLTAREN) 1 % GEL Apply 2 g topically  as needed (to affected area for pain).      [name] digoxin (LANOXIN) 0.25 MG tablet Take 1 tablet (0.25 mg total) by mouth daily.  30 tablet  5  . gabapentin (NEURONTIN) 300 MG capsule Take 2 capsules (600 mg total) by mouth 3 (three) times daily.  540 capsule  3  . Multiple Vitamins-Minerals (OCUVITE PRESERVISION) TABS Take 2 tablets by mouth 2 (two) times daily.       [name] omeprazole (PRILOSEC) 20 MG capsule Take 20 mg by mouth 2 (two) times daily.      [name] omeprazole (PRILOSEC) 20 MG capsule TAKE ONE CAPSULE BY MOUTH EVERY DAY  90 capsule  3  . Petrolatum-Zinc Oxide (SENSI-CARE PROTECTIVE BARRIER) 49-15 % OINT Apply 1 application topically 3 (three) times daily.  113 g  6  . Rivaroxaban (XARELTO) 20 MG TABS Take 1 tablet (20 mg total) by mouth daily. Start on 9/27  30 tablet  3  .  rosuvastatin (CRESTOR) 20 MG tablet Take 20 mg by mouth once a week. On Fridays      . Tamsulosin HCl (FLOMAX) 0.4 MG CAPS Take 0.4 mg by mouth daily.      [name] tiotropium (SPIRIVA HANDIHALER) 18 MCG inhalation capsule Place 1 capsule (18 mcg total) into inhaler and inhale daily.  30 capsule  12  . zolpidem (AMBIEN) 5 MG tablet Take 1 tablet (5 mg total) by mouth at bedtime as needed for sleep.  30 tablet  5   No current facility-administered medications on file prior to visit.    BP 142/60  Pulse 77  Wt 199 lb (90.266 kg)  BMI 28.55 kg/m2  SpO2 96%chart    Objective:   Physical Exam  Constitutional: He is oriented to person, place, and time. He appears well-developed and well-nourished.  Cardiovascular: Normal rate, regular rhythm and normal heart sounds.   Pulmonary/Chest: Effort normal and breath sounds normal.    Abdominal: Soft. Bowel sounds are normal.  Neurological: He is alert and oriented to person, place, and time.  Skin: Skin is warm and dry. Rash noted.  Psychiatric: He has a normal mood and affect.          Assessment & Plan:  Assessment: 1. Left breast mass 2. Atrial fibrillation  Plan: We'll schedule a diagnostic mammogram of the left breast. Will notify patient pending results. Patient requests samples of Xarelto. I will call him on samples become available.

## 2012-11-14 ENCOUNTER — Other Ambulatory Visit: Payer: Self-pay | Admitting: Family

## 2012-11-14 DIAGNOSIS — N6320 Unspecified lump in the left breast, unspecified quadrant: Secondary | ICD-10-CM

## 2012-11-24 ENCOUNTER — Ambulatory Visit
Admission: RE | Admit: 2012-11-24 | Discharge: 2012-11-24 | Disposition: A | Discharge status: Home / Self Care | Payer: Medicare Other | Source: Ambulatory Visit | Attending: Family | Admitting: Family

## 2012-11-24 DIAGNOSIS — N6320 Unspecified lump in the left breast, unspecified quadrant: Secondary | ICD-10-CM

## 2012-12-04 ENCOUNTER — Telehealth: Payer: Self-pay | Admitting: Internal Medicine

## 2012-12-04 NOTE — Telephone Encounter (Signed)
PT daughter called to request samples of Rivaroxaban (XARELTO) 20 MG TABS. Please assist.

## 2012-12-04 NOTE — Telephone Encounter (Signed)
Left message on machine for patient samples available for pick up

## 2012-12-06 ENCOUNTER — Ambulatory Visit (INDEPENDENT_AMBULATORY_CARE_PROVIDER_SITE_OTHER): Payer: Medicare Other | Admitting: Cardiology

## 2012-12-06 ENCOUNTER — Encounter: Payer: Self-pay | Admitting: Cardiology

## 2012-12-06 VITALS — BP 102/58 | HR 61 | Ht 69.0 in | Wt 198.3 lb

## 2012-12-06 DIAGNOSIS — R531 Weakness: Secondary | ICD-10-CM

## 2012-12-06 DIAGNOSIS — I4891 Unspecified atrial fibrillation: Secondary | ICD-10-CM

## 2012-12-06 DIAGNOSIS — R5383 Other fatigue: Secondary | ICD-10-CM

## 2012-12-06 DIAGNOSIS — R5381 Other malaise: Secondary | ICD-10-CM

## 2012-12-06 DIAGNOSIS — I5021 Acute systolic (congestive) heart failure: Secondary | ICD-10-CM

## 2012-12-06 DIAGNOSIS — R55 Syncope and collapse: Secondary | ICD-10-CM | POA: Insufficient documentation

## 2012-12-06 DIAGNOSIS — I509 Heart failure, unspecified: Secondary | ICD-10-CM

## 2012-12-06 DIAGNOSIS — Z7901 Long term (current) use of anticoagulants: Secondary | ICD-10-CM | POA: Insufficient documentation

## 2012-12-06 LAB — CBC
HCT: 33.8 % — ABNORMAL LOW (ref 39.0–52.0)
Hemoglobin: 11.4 g/dL — ABNORMAL LOW (ref 13.0–17.0)
MCH: 31.2 pg (ref 26.0–34.0)
MCHC: 33.7 g/dL (ref 30.0–36.0)
MCV: 92.6 fL (ref 78.0–100.0)
Platelets: 223 10*3/uL (ref 150–400)
RBC: 3.65 MIL/uL — ABNORMAL LOW (ref 4.22–5.81)
RDW: 15.5 % (ref 11.5–15.5)
WBC: 6.4 10*3/uL (ref 4.0–10.5)

## 2012-12-06 MED ORDER — AMIODARONE HCL 200 MG PO TABS: 200.0000 mg | ORAL_TABLET | Freq: Every day | ORAL | Status: DC

## 2012-12-06 NOTE — Patient Instructions (Addendum)
Your physician has recommended you make the following change in your medication:  Take 200mg  Amiodarone daily. We have ordered this dosage and sent to your pharmacy.  STOP Lanoxin.   Your physician recommends that you return for lab work today. You will be given a two week event monitor.

## 2012-12-06 NOTE — Assessment & Plan Note (Signed)
"  Looks pale" per his daughter

## 2012-12-06 NOTE — Progress Notes (Signed)
12/06/2012 [name]   August 31, 1926  [id number]  Primary Physicia Carrie Mew, MD Primary Cardiologist: Dr Rennis Golden  HPI: 79 y/o with a history of AF. We saw him in Feb 2014 and attempted cardioversion then but he failed to convert. He was put on Amiodarone and cardioverted successfully in April 2014. He has been on Amiodarone and Xarelto. Today he is in the office complaining of near syncopal episodes. He describes a "flushed feeling" with weakness. His HR at home is in the 50s. He has noted more of these episodes when he is trying to urinate..   Current Outpatient Prescriptions  Medication Sig Dispense Refill  . acetaminophen (TYLENOL) 500 MG tablet Take 500 mg by mouth every 8 (eight) hours as needed for pain (with tramadol). Takes with Tramadol      . bumetanide (BUMEX) 1 MG tablet Take 1 mg by mouth 2 (two) times daily. Take 1 mg daily on m-w-f and twice daily tues; thurs; sat sun      . Calcium Carbonate-Vitamin D (CALCIUM 600+D) 600-400 MG-UNIT per tablet Take 1 tablet by mouth 3 (three) times a week.       . diclofenac sodium (VOLTAREN) 1 % GEL Apply 2 g topically as needed (to affected area for pain).      [name] gabapentin (NEURONTIN) 300 MG capsule Take 2 capsules (600 mg total) by mouth 3 (three) times daily.  540 capsule  3  . Multiple Vitamins-Minerals (OCUVITE PRESERVISION) TABS Take 2 tablets by mouth 2 (two) times daily.       [name] omeprazole (PRILOSEC) 20 MG capsule TAKE ONE CAPSULE BY MOUTH EVERY DAY  90 capsule  3  . Rivaroxaban (XARELTO) 20 MG TABS Take 1 tablet (20 mg total) by mouth daily. Start on 9/27  30 tablet  3  . rosuvastatin (CRESTOR) 20 MG tablet Take 20 mg by mouth once a week. On Fridays      . Tamsulosin HCl (FLOMAX) 0.4 MG CAPS Take 0.4 mg by mouth daily.      [name] tiotropium (SPIRIVA HANDIHALER) 18 MCG inhalation capsule Place 1 capsule (18 mcg total) into inhaler and inhale daily.  30 capsule  12  . traMADol (ULTRAM) 50 MG tablet Take 1 tablet (50 mg total)  by mouth every 8 (eight) hours as needed for pain. Takes with 500mg  tylenol  90 tablet  5  . zolpidem (AMBIEN) 5 MG tablet Take 1 tablet (5 mg total) by mouth at bedtime as needed for sleep.  30 tablet  5  . amiodarone (PACERONE) 200 MG tablet Take 1 tablet (200 mg total) by mouth daily.  30 tablet  5   No current facility-administered medications for this visit.    Allergies  Allergen Reactions  . Nsaids   . Other     Opiates cause tightness in chest  . Vicodin (Hydrocodone-Acetaminophen) Other (See Comments)    Unknown reaction many years ago  . Morphine And Related Nausea And Vomiting    History   Social History  . Marital Status: Widowed    Spouse Name: N/A    Number of Children: N/A  . Years of Education: N/A   Occupational History  . retired    Social History Main Topics  . Smoking status: Former Smoker    Quit date: 05/17/1964  . Smokeless tobacco: Never Used  . Alcohol Use: No  . Drug Use: No  . Sexually Active: Not Currently   Other Topics Concern  . Not on file  Social History Narrative  . No narrative on file     Review of Systems: General: negative for chills, fever, night sweats or weight changes.  Cardiovascular: negative for chest pain, dyspnea on exertion, edema, orthopnea, palpitations, paroxysmal nocturnal dyspnea or shortness of breath Dermatological: negative for rash Respiratory: negative for cough or wheezing Urologic: negative for hematuria Abdominal: negative for nausea, vomiting, diarrhea, bright red blood per rectum, melena, or hematemesis Neurologic: negative for visual changes, syncope, or dizziness All other systems reviewed and are otherwise negative except as noted above.    Blood pressure 102/58, pulse 61, height 5\' 9"  (1.753 m), weight 198 lb 4.8 oz (89.948 kg).  General appearance: alert, cooperative, appears stated age, no distress and pale Lungs: clear to auscultation bilaterally Heart: regular rate and rhythm  EKG  EKG:  normal EKG, normal sinus rhythm, unchanged from previous tracings.NSR SB, RBBB, LAFB, 1st AVB block  ASSESSMENT AND PLAN:   Paroxysmal Atrial fibrillation - admitted with RVR DCCV April 2014 on Amiodarone  General weakness "Looks pale" per his daughter  Chronic anticoagulation Xarelto  Near syncope R/O bradycardia   PLAN  He is till on Amiodarone 400 mg daily as well as Lanoxin 0.25 mg daily. I decreased his Amiodarone to 200 mg daily and stopped his Lanoxin. I ordered a BMP and a CBC. He was fitted for a two week event monitor and will return in two weeks for follow up.   [name] KPA-C 12/06/2012 5:11 PM

## 2012-12-06 NOTE — Assessment & Plan Note (Signed)
R/O bradycardia

## 2012-12-06 NOTE — Assessment & Plan Note (Signed)
DCCV April 2014 on Amiodarone

## 2012-12-06 NOTE — Assessment & Plan Note (Signed)
Xarelto

## 2012-12-07 LAB — BASIC METABOLIC PANEL
BUN: 19 mg/dL (ref 6–23)
CO2: 30 mEq/L (ref 19–32)
Calcium: 9.1 mg/dL (ref 8.4–10.5)
Chloride: 96 mEq/L (ref 96–112)
Creat: 1.01 mg/dL (ref 0.50–1.35)
Glucose, Bld: 105 mg/dL — ABNORMAL HIGH (ref 70–99)
Potassium: 4.4 mEq/L (ref 3.5–5.3)
Sodium: 138 mEq/L (ref 135–145)

## 2012-12-13 ENCOUNTER — Other Ambulatory Visit: Payer: Self-pay | Admitting: Internal Medicine

## 2012-12-19 ENCOUNTER — Encounter: Payer: Self-pay | Admitting: *Deleted

## 2012-12-21 ENCOUNTER — Encounter: Payer: Self-pay | Admitting: Internal Medicine

## 2012-12-21 ENCOUNTER — Other Ambulatory Visit: Payer: Self-pay | Admitting: *Deleted

## 2012-12-21 ENCOUNTER — Ambulatory Visit (INDEPENDENT_AMBULATORY_CARE_PROVIDER_SITE_OTHER): Payer: Medicare Other | Admitting: Internal Medicine

## 2012-12-21 VITALS — BP 110/48 | HR 52 | Ht 69.0 in | Wt 203.6 lb

## 2012-12-21 DIAGNOSIS — E785 Hyperlipidemia, unspecified: Secondary | ICD-10-CM

## 2012-12-21 DIAGNOSIS — I4891 Unspecified atrial fibrillation: Secondary | ICD-10-CM

## 2012-12-21 DIAGNOSIS — I10 Essential (primary) hypertension: Secondary | ICD-10-CM

## 2012-12-21 DIAGNOSIS — R55 Syncope and collapse: Secondary | ICD-10-CM

## 2012-12-21 NOTE — Patient Instructions (Signed)
Your physician wants you to follow-up in:  6 months. You will receive a reminder letter in the mail two months in advance. If you don't receive a letter, please call our office to schedule the follow-up appointment.   

## 2012-12-21 NOTE — Progress Notes (Signed)
12/21/2012 [name]   1926-05-22  [id number]  Primary Physicia [name] Mew, MD Primary Cardiologist: Dr [name]  HPI: 79 y/o with a history of AF. We saw him in Feb 2014 and attempted cardioversion then but he failed to convert. He was put on Amiodarone and cardioverted successfully in April 2014. He has been on Amiodarone and Xarelto. He saw [name] recently with complaints of near syncopal episodes. He describes a "flushed feeling" with weakness. His HR at home is in the 50s. He has noted more of these episodes when he is trying to urinate. [name] had stopped his digoxin and lower his dose of amiodarone to 200 mg daily. Since that time his symptoms have improved significantly, however he has had some episodes of low blood pressure. At that time he was diagnosed with atrial fibrillation his EF was 40%, suspect it may be improved as it may been tachycardia related. He continues on Bumex 1 mg daily for most days of the week and 2 or 3 days a week takes it twice daily. This may however be too much diuretic for him.  He also wore a cardia monitor between 12/06/2012 at 12/19/2012. During the 315 hours of monitoring, there was only sinus rhythm and a short episode of sinus bradycardia at 61 with a first degree AV block and intraventricular conduction delay. No pauses or atrial fibrillation were noted.  Current Outpatient Prescriptions  Medication Sig Dispense Refill  . acetaminophen (TYLENOL) 500 MG tablet Take 500 mg by mouth every 8 (eight) hours as needed for pain (with tramadol). Takes with Tramadol      . amiodarone (PACERONE) 200 MG tablet Take 1 tablet (200 mg total) by mouth daily.  30 tablet  5  . bumetanide (BUMEX) 1 MG tablet Take 1 mg by mouth 2 (two) times daily. Take 1 mg daily on Mon, Wed, Fri, Sat, and Sun. Take 2 tablets Tues and Thurs.      . Calcium Carbonate-Vitamin D (CALCIUM 600+D) 600-400 MG-UNIT per tablet Take 1 tablet by mouth 3 (three) times a week.       .  cetirizine (ZYRTEC) 10 MG tablet Take 10 mg by mouth as needed for allergies.      [name] diclofenac sodium (VOLTAREN) 1 % GEL Apply 2 g topically as needed (to affected area for pain).      [name] gabapentin (NEURONTIN) 300 MG capsule Take 2 capsules (600 mg total) by mouth 3 (three) times daily.  540 capsule  3  . Multiple Vitamin (MULTIVITAMIN) tablet Take 1 tablet by mouth daily.      . Multiple Vitamins-Minerals (OCUVITE PRESERVISION) TABS Take 2 tablets by mouth 2 (two) times daily.       [name] omeprazole (PRILOSEC) 20 MG capsule TAKE ONE CAPSULE BY MOUTH EVERY DAY  90 capsule  3  . Rivaroxaban (XARELTO) 20 MG TABS Take 1 tablet (20 mg total) by mouth daily. Start on 9/27  30 tablet  3  . rosuvastatin (CRESTOR) 20 MG tablet Take 20 mg by mouth once a week. On Fridays      . tamsulosin (FLOMAX) 0.4 MG CAPS TAKE ONE CAPSULE BY MOUTH EVERY DAY  60 capsule  0  . tiotropium (SPIRIVA HANDIHALER) 18 MCG inhalation capsule Place 1 capsule (18 mcg total) into inhaler and inhale daily.  30 capsule  12  . traMADol (ULTRAM) 50 MG tablet Take 1 tablet (50 mg total) by mouth every 8 (eight) hours as needed for pain. Takes with 500mg  tylenol  90 tablet  5  . zolpidem (AMBIEN) 5 MG tablet Take 1 tablet (5 mg total) by mouth at bedtime as needed for sleep.  30 tablet  5   No current facility-administered medications for this visit.    Allergies  Allergen Reactions  . Nsaids   . Other     Opiates cause tightness in chest  . Vicodin (Hydrocodone-Acetaminophen) Other (See Comments)    Unknown reaction many years ago  . Morphine And Related Nausea And Vomiting    History   Social History  . Marital Status: Widowed    Spouse Name: N/A    Number of Children: N/A  . Years of Education: N/A   Occupational History  . retired    Social History Main Topics  . Smoking status: Former Smoker    Quit date: 05/17/1964  . Smokeless tobacco: Never Used  . Alcohol Use: No  . Drug Use: No  . Sexually Active: Not  Currently   Other Topics Concern  . Not on file   Social History Narrative  . No narrative on file     Review of Systems: General: negative for chills, fever, night sweats or weight changes.  Cardiovascular: negative for chest pain, dyspnea on exertion, edema, orthopnea, palpitations, paroxysmal nocturnal dyspnea or shortness of breath Dermatological: negative for rash Respiratory: negative for cough or wheezing Urologic: negative for hematuria Abdominal: negative for nausea, vomiting, diarrhea, bright red blood per rectum, melena, or hematemesis Neurologic: negative for visual changes, syncope, or dizziness All other systems reviewed and are otherwise negative except as noted above.    Blood pressure 110/48, pulse 52, height 5\' 9"  (1.753 m), weight 203 lb 9.6 oz (92.352 kg).  General appearance: alert, cooperative, appears stated age, no distress and pale Lungs: clear to auscultation bilaterally Heart: regular rate and rhythm  EKG  deferred  ASSESSMENT AND PLAN:   Patient Active Problem List   Diagnosis Date Noted  . Chronic anticoagulation 12/06/2012  . Near syncope 12/06/2012  . Lower extremity edema 07/05/2012  . Acute systolic CHF (congestive heart failure), NYHA class 3 -- Unclear etilogy (? Afib related) EF down from 60-70% to 40%. 07/05/2012  . General weakness 07/05/2012  . Paroxysmal Atrial fibrillation - admitted with RVR 01/18/2012  . DJD (degenerative joint disease), lumbar 01/18/2012  . Hx of colonic polyps 11/02/2011  . BENIGN PROSTATIC HYPERTROPHY 05/13/2010  . SEBORRHEA CAPITIS 02/13/2010  . History of elevated glucose 10/01/2009  . CHRONIC RHINITIS 06/02/2009  . HYPERLIPIDEMIA 11/01/2008  . NASAL POLYP 07/03/2008  . INSOMNIA, CHRONIC, MILD 01/02/2008  . HYPERTENSION 02/06/2007  . ASTHMA 02/06/2007    PLAN   [name] seems to be tolerating the decrease his medications. Biliary track of her see that could be contributing to presyncope or low blood  pressures that he is on Flomax. This could be switched to finasteride or not alpha blocker for the prostate benefit with less risk of orthostasis or low blood pressure. Also think that he may be on too much diuretic. I recommended decreasing it to 1 mg of Bumex daily, but considering cutting and half and taking a dose in the morning and early afternoon. Hopefully these changes will help him with his symptoms. We'll plan to see him back in 6 months.  Chrystie Nose, MD, [facility] Attending Cardiologist [facility]   HILTY,Kenneth C 12/21/2012 1:10 PM

## 2013-01-08 ENCOUNTER — Ambulatory Visit: Payer: Medicare Other | Admitting: Internal Medicine

## 2013-01-22 ENCOUNTER — Encounter: Payer: Self-pay | Admitting: Internal Medicine

## 2013-01-22 ENCOUNTER — Ambulatory Visit (INDEPENDENT_AMBULATORY_CARE_PROVIDER_SITE_OTHER): Payer: Medicare Other | Admitting: Internal Medicine

## 2013-01-22 VITALS — BP 124/70 | HR 76 | Temp 98.3°F | Resp 16 | Ht 69.0 in | Wt 200.0 lb

## 2013-01-22 DIAGNOSIS — I480 Paroxysmal atrial fibrillation: Secondary | ICD-10-CM

## 2013-01-22 DIAGNOSIS — N401 Enlarged prostate with lower urinary tract symptoms: Secondary | ICD-10-CM

## 2013-01-22 DIAGNOSIS — I4891 Unspecified atrial fibrillation: Secondary | ICD-10-CM

## 2013-01-22 DIAGNOSIS — R600 Localized edema: Secondary | ICD-10-CM

## 2013-01-22 DIAGNOSIS — Z23 Encounter for immunization: Secondary | ICD-10-CM

## 2013-01-22 DIAGNOSIS — R0602 Shortness of breath: Secondary | ICD-10-CM

## 2013-01-22 DIAGNOSIS — N138 Other obstructive and reflux uropathy: Secondary | ICD-10-CM

## 2013-01-22 DIAGNOSIS — E8881 Metabolic syndrome: Secondary | ICD-10-CM

## 2013-01-22 DIAGNOSIS — R609 Edema, unspecified: Secondary | ICD-10-CM

## 2013-01-22 MED ORDER — TERAZOSIN HCL 2 MG PO CAPS: 2.0000 mg | ORAL_CAPSULE | Freq: Every day | ORAL | Status: DC

## 2013-01-22 NOTE — Progress Notes (Signed)
Subjective:    Patient ID: [name], male    DOB: Oct 28, 1926, 79 y.o.   MRN: [id number]  HPI  Due to low blood pressure his digoxin, amiodarone and Bumex was reduced. The family gives an occasional extra diuretic if the feet swell too much Wearing support hose and will try compression hose    Review of Systems  Constitutional: Positive for fatigue. Negative for fever.  HENT: Negative for hearing loss, congestion, neck pain and postnasal drip.   Eyes: Negative for discharge, redness and visual disturbance.  Respiratory: Positive for shortness of breath. Negative for cough and wheezing.   Cardiovascular: Positive for palpitations and leg swelling.  Gastrointestinal: Positive for abdominal pain. Negative for constipation and abdominal distention.  Genitourinary: Negative for urgency and frequency.  Musculoskeletal: Positive for joint swelling. Negative for arthralgias.  Skin: Negative for color change and rash.       edema  Neurological: Positive for weakness. Negative for light-headedness.  Hematological: Negative for adenopathy.  Psychiatric/Behavioral: Negative for behavioral problems.   Past Medical History  Diagnosis Date  . Obesity   . Hypertension   . Edema   . BPH (benign prostatic hypertrophy)   . Asthma   . Bronchospasm, exercise-induced   . Chronic kidney disease   . Arthritis   . CHF (congestive heart failure)     systolic, EF 40% (07/07/2012)  . GERD (gastroesophageal reflux disease)   . A-fib   . Shortness of breath   . Dyslipidemia   . Type 2 diabetes mellitus     History   Social History  . Marital Status: Widowed    Spouse Name: N/A    Number of Children: N/A  . Years of Education: N/A   Occupational History  . retired    Social History Main Topics  . Smoking status: Former Smoker    Quit date: 05/17/1964  . Smokeless tobacco: Never Used  . Alcohol Use: No  . Drug Use: No  . Sexual Activity: Not Currently   Other Topics Concern   . Not on file   Social History Narrative  . No narrative on file    Past Surgical History  Procedure Laterality Date  . Vascular surgery  removed varicose veins  . Vasectomy    . Cardioversion N/A 07/07/2012    Procedure: CARDIOVERSION;  Surgeon: Chrystie Nose, MD;  Location: [facility] ENDOSCOPY;  Service: Cardiovascular;  Laterality: N/A;  . Cardioversion N/A 08/30/2012    Procedure: CARDIOVERSION;  Surgeon: Chrystie Nose, MD;  Location: [facility] ENDOSCOPY;  Service: Cardiovascular;  Laterality: N/A;  . Transthoracic echocardiogram  07/07/2012    ef 40%; mild MR; LA mod-severely dilated; RA mildly dilated; RV systolic function mildly reduced; RV systolic pressure increase - mod pulm htn    Family History  Problem Relation Age of Onset  . Alzheimer's disease Mother   . Heart disease Father     Allergies  Allergen Reactions  . Nsaids   . Other     Opiates cause tightness in chest  . Vicodin [Hydrocodone-Acetaminophen] Other (See Comments)    Unknown reaction many years ago  . Morphine And Related Nausea And Vomiting    Current Outpatient Prescriptions on File Prior to Visit  Medication Sig Dispense Refill  . acetaminophen (TYLENOL) 500 MG tablet Take 500 mg by mouth every 8 (eight) hours as needed for pain (with tramadol). Takes with Tramadol      . amiodarone (PACERONE) 200 MG tablet Take 1 tablet (200 mg total)  by mouth daily.  30 tablet  5  . bumetanide (BUMEX) 1 MG tablet Take 0.5 mg by mouth 2 (two) times daily. Take 1 mg daily on Mon, Wed, Fri, Sat, and Sun. Take 2 tablets Tues and Thurs.      . Calcium Carbonate-Vitamin D (CALCIUM 600+D) 600-400 MG-UNIT per tablet Take 1 tablet by mouth 3 (three) times a week.       . cetirizine (ZYRTEC) 10 MG tablet Take 10 mg by mouth as needed for allergies.      [name] diclofenac sodium (VOLTAREN) 1 % GEL Apply 2 g topically as needed (to affected area for pain).      [name] gabapentin (NEURONTIN) 300 MG capsule Take 2 capsules (600 mg total) by  mouth 3 (three) times daily.  540 capsule  3  . Multiple Vitamin (MULTIVITAMIN) tablet Take 1 tablet by mouth daily.      . Multiple Vitamins-Minerals (OCUVITE PRESERVISION) TABS Take 2 tablets by mouth 2 (two) times daily.       [name] omeprazole (PRILOSEC) 20 MG capsule TAKE ONE CAPSULE BY MOUTH EVERY DAY  90 capsule  3  . Rivaroxaban (XARELTO) 20 MG TABS Take 1 tablet (20 mg total) by mouth daily. Start on 9/27  30 tablet  3  . rosuvastatin (CRESTOR) 20 MG tablet Take 20 mg by mouth once a week. On Fridays      . tamsulosin (FLOMAX) 0.4 MG CAPS TAKE ONE CAPSULE BY MOUTH EVERY DAY  60 capsule  0  . tiotropium (SPIRIVA HANDIHALER) 18 MCG inhalation capsule Place 1 capsule (18 mcg total) into inhaler and inhale daily.  30 capsule  12  . traMADol (ULTRAM) 50 MG tablet Take 1 tablet (50 mg total) by mouth every 8 (eight) hours as needed for pain. Takes with 500mg  tylenol  90 tablet  5  . zolpidem (AMBIEN) 5 MG tablet Take 1 tablet (5 mg total) by mouth at bedtime as needed for sleep.  30 tablet  5   No current facility-administered medications on file prior to visit.    BP 124/70  Pulse 76  Temp(Src) 98.3 F (36.8 C)  Resp 16  Ht 5\' 9"  (1.753 m)  Wt 200 lb (90.719 kg)  BMI 29.52 kg/m2       Objective:   Physical Exam  Nursing note and vitals reviewed. Constitutional: He is oriented to person, place, and time. He appears well-developed and well-nourished.  HENT:  Head: Normocephalic and atraumatic.  Eyes: Conjunctivae are normal. Pupils are equal, round, and reactive to light.  Cardiovascular: Regular rhythm.   Murmur heard. Pulmonary/Chest: Effort normal. He has no wheezes. He has no rales.  Abdominal: Soft. Bowel sounds are normal.  Neurological: He is alert and oriented to person, place, and time.  Skin: Skin is warm and dry.  Psychiatric: He has a normal mood and affect. His behavior is normal.          Assessment & Plan:  Compression hose for increased edema Need to  amubulate Need to change the flomax due to the lightheadedness and fall risk

## 2013-01-22 NOTE — Patient Instructions (Signed)
The patient is instructed to continue all medications as prescribed. Schedule followup with check out clerk upon leaving the clinic  

## 2013-01-23 LAB — CBC WITH DIFFERENTIAL/PLATELET
Basophils Absolute: 0.1 10*3/uL (ref 0.0–0.1)
Basophils Relative: 1.4 % (ref 0.0–3.0)
Eosinophils Absolute: 0.1 10*3/uL (ref 0.0–0.7)
Hemoglobin: 11.1 g/dL — ABNORMAL LOW (ref 13.0–17.0)
MCHC: 33.3 g/dL (ref 30.0–36.0)
MCV: 95.9 fl (ref 78.0–100.0)
Monocytes Absolute: 0.7 10*3/uL (ref 0.1–1.0)
Neutro Abs: 3.9 10*3/uL (ref 1.4–7.7)
RBC: 3.48 Mil/uL — ABNORMAL LOW (ref 4.22–5.81)
RDW: 15.6 % — ABNORMAL HIGH (ref 11.5–14.6)

## 2013-01-23 LAB — BASIC METABOLIC PANEL
BUN: 22 mg/dL (ref 6–23)
Calcium: 9 mg/dL (ref 8.4–10.5)
Creatinine, Ser: 1 mg/dL (ref 0.4–1.5)
GFR: 77.5 mL/min (ref >60.00)

## 2013-01-30 ENCOUNTER — Telehealth: Payer: Self-pay | Admitting: Internal Medicine

## 2013-01-30 NOTE — Telephone Encounter (Signed)
Talked with daughter- she wants to wait and let dr Lovell Sheehan answer message tomorrow

## 2013-01-30 NOTE — Telephone Encounter (Signed)
Attempted call back to Daughter,Mary, at 1237 on 9-16, vmail left.

## 2013-01-30 NOTE — Telephone Encounter (Signed)
Patient Information:  Caller Name: Corrie Dandy  Phone: [phone]  Patient: [name], [name]  Gender: Male  DOB: 07/16/1926  Age: 79 Years  PCP: Darryll Capers (Adults only)  Office Follow Up:  Does the office need to follow up with this patient?: Yes  Instructions For The Office: Please advise about medication changes.  Are there other changes that would help him to be able to urinate and not have low BP?   Confirmed that Dr. Lovell Sheehan is not in the office 01/30/13; caller asked if Dr. Lesia Hausen would make recommendations since he was the one who initiated Flomax treatment.   Symptoms  Reason For Call & Symptoms: Seen 01/22/13 .  Daughter has concerns about reactions to medication changes that were made related to his low blood pressure.  (He had dizziness due to low BP- and that  has resolved.).     He no longer takes Digoxin.  He is on Amiodarone 200mg  instead of 400mg   daily.  Prostate medication changed to Terazosin instead of Flomax.  He currently has strange sensation with urinating, takes a long time to get started and takes a while to urinate.  Symptoms worse since 01/27/13.  These are the same symptoms as initially with prostate problems.  Caller asks if there is lower dose for Flomax that may work for him.  Discuss with PCP and Callback by Nurse Today per NO Protocol Available - Sick Adult guideline due to Nursing judgment.  Caller agreed.  Confirmed that Dr. Lovell Sheehan is not in the office 01/30/13; caller asked if Dr. Lesia Hausen would make recommendations since he was the one who initiated Flomax treatment.  Reviewed Health History In EMR: Yes  Reviewed Medications In EMR: Yes  Reviewed Allergies In EMR: Yes  Reviewed Surgeries / Procedures: Yes  Date of Onset of Symptoms: 01/27/2013  Treatments Tried: Medication changes.  Treatments Tried Worked: No  Guideline(s) Used:  No Protocol Available - Sick Adult  Disposition Per Guideline:   Discuss with PCP and Callback by Nurse Today  Reason  For Disposition Reached:   Nursing judgment  Advice Given:  Call Back If:  New symptoms develop  You become worse.  RN Overrode Recommendation:  Document Patient  Confirmed that Dr. Lovell Sheehan is not in the office 01/30/13; caller asked if Dr. Lesia Hausen would make recommendations since he was the one who initiated Flomax treatment.

## 2013-02-01 NOTE — Telephone Encounter (Signed)
Pt daughter, calling to inquire about this. She states when you call her back, to have the operator page her. Thank you!

## 2013-02-02 ENCOUNTER — Other Ambulatory Visit: Payer: Self-pay | Admitting: *Deleted

## 2013-02-02 MED ORDER — TAMSULOSIN HCL 0.4 MG PO CAPS: 0.4000 mg | ORAL_CAPSULE | Freq: Every day | ORAL | Status: DC

## 2013-02-02 NOTE — Telephone Encounter (Signed)
Stop the Flomax and replace it with Hytrin 2 mg

## 2013-02-02 NOTE — Telephone Encounter (Signed)
Talked with daughter and he couldn't tolerate hytrin, so he went back on flomax.. Saw palmetto was also suggested

## 2013-02-16 ENCOUNTER — Encounter: Payer: Self-pay | Admitting: Internal Medicine

## 2013-02-19 ENCOUNTER — Other Ambulatory Visit: Payer: Self-pay | Admitting: *Deleted

## 2013-02-19 MED ORDER — TAMSULOSIN HCL 0.4 MG PO CAPS: 0.4000 mg | ORAL_CAPSULE | Freq: Every day | ORAL | Status: DC

## 2013-03-12 ENCOUNTER — Other Ambulatory Visit: Payer: Self-pay | Admitting: Family

## 2013-03-28 ENCOUNTER — Telehealth: Payer: Self-pay | Admitting: Internal Medicine

## 2013-03-28 NOTE — Telephone Encounter (Signed)
Per dr Lovell Sheehan try 500 of magnesium- pt informed

## 2013-03-28 NOTE — Telephone Encounter (Signed)
gabapentin (NEURONTIN) 300 MG capsule, son wants to know can he increase his dosage of this medication due to the nerves in his left leg bothering him. Please call son back.

## 2013-04-11 ENCOUNTER — Other Ambulatory Visit: Payer: Self-pay | Admitting: Internal Medicine

## 2013-04-13 ENCOUNTER — Other Ambulatory Visit: Payer: Self-pay | Admitting: Internal Medicine

## 2013-05-08 ENCOUNTER — Other Ambulatory Visit: Payer: Self-pay | Admitting: Internal Medicine

## 2013-05-30 ENCOUNTER — Other Ambulatory Visit: Payer: Self-pay | Admitting: *Deleted

## 2013-05-30 ENCOUNTER — Telehealth: Payer: Self-pay | Admitting: Internal Medicine

## 2013-05-30 MED ORDER — TRAMADOL HCL 50 MG PO TABS: 50.0000 mg | ORAL_TABLET | Freq: Three times a day (TID) | ORAL | Status: DC | PRN

## 2013-05-30 NOTE — Telephone Encounter (Signed)
Pt needs refill on tramadol call into [facility]

## 2013-05-30 NOTE — Telephone Encounter (Signed)
done

## 2013-06-01 ENCOUNTER — Ambulatory Visit (INDEPENDENT_AMBULATORY_CARE_PROVIDER_SITE_OTHER): Payer: Medicare Other | Admitting: Internal Medicine

## 2013-06-01 ENCOUNTER — Encounter: Payer: Self-pay | Admitting: Internal Medicine

## 2013-06-01 VITALS — BP 144/80 | HR 76 | Temp 98.0°F | Resp 18 | Ht 69.0 in | Wt 204.0 lb

## 2013-06-01 DIAGNOSIS — I5022 Chronic systolic (congestive) heart failure: Secondary | ICD-10-CM

## 2013-06-01 DIAGNOSIS — M549 Dorsalgia, unspecified: Secondary | ICD-10-CM

## 2013-06-01 DIAGNOSIS — I509 Heart failure, unspecified: Secondary | ICD-10-CM

## 2013-06-01 DIAGNOSIS — G8929 Other chronic pain: Secondary | ICD-10-CM

## 2013-06-01 MED ORDER — GABAPENTIN 400 MG PO CAPS: 800.0000 mg | ORAL_CAPSULE | Freq: Three times a day (TID) | ORAL | Status: DC

## 2013-06-01 NOTE — Patient Instructions (Signed)
Take an extra 300 mg Neurontin at bedtime until the higher dose comes

## 2013-06-01 NOTE — Progress Notes (Signed)
Subjective:    Patient ID: [name], male    DOB: 07-12-1926, 80 y.o.   MRN: [id number]  HPI The patient has severe DJD of knee and needs TKR Has  Appointment with Cardiology for clearance Had a fall in Dec with knee trauma Has increased neuropathic pain Chronic CHF is stable and HR is SR today  Increased  The gabapentin?  Review of Systems  Constitutional: Negative for fever and fatigue.  HENT: Negative for congestion, hearing loss and postnasal drip.   Eyes: Negative for discharge, redness and visual disturbance.  Respiratory: Negative for cough, shortness of breath and wheezing.   Cardiovascular: Negative for leg swelling.  Gastrointestinal: Negative for abdominal pain, constipation and abdominal distention.  Genitourinary: Negative for urgency and frequency.  Musculoskeletal: Negative for arthralgias, joint swelling and neck pain.  Skin: Negative for color change and rash.  Neurological: Negative for weakness and light-headedness.  Hematological: Negative for adenopathy.  Psychiatric/Behavioral: Negative for behavioral problems.   Past Medical History  Diagnosis Date  . Obesity   . Hypertension   . Edema   . BPH (benign prostatic hypertrophy)   . Asthma   . Bronchospasm, exercise-induced   . Chronic kidney disease   . Arthritis   . CHF (congestive heart failure)     systolic, EF 40% (07/07/2012)  . GERD (gastroesophageal reflux disease)   . A-fib   . Shortness of breath   . Dyslipidemia   . Type 2 diabetes mellitus     History   Social History  . Marital Status: Widowed    Spouse Name: N/A    Number of Children: N/A  . Years of Education: N/A   Occupational History  . retired    Social History Main Topics  . Smoking status: Former Smoker    Quit date: 05/17/1964  . Smokeless tobacco: Never Used  . Alcohol Use: No  . Drug Use: No  . Sexual Activity: Not Currently   Other Topics Concern  . Not on file   Social History Narrative  . No  narrative on file    Past Surgical History  Procedure Laterality Date  . Vascular surgery  removed varicose veins  . Vasectomy    . Cardioversion N/A 07/07/2012    Procedure: CARDIOVERSION;  Surgeon: Chrystie NoseKenneth C. Hilty, MD;  [facility];  Service: Cardiovascular;  Laterality: N/A;  . Cardioversion N/A 08/30/2012    Procedure: CARDIOVERSION;  Surgeon: Chrystie NoseKenneth C. Hilty, MD;  [facility];  Service: Cardiovascular;  Laterality: N/A;  . Transthoracic echocardiogram  07/07/2012    ef 40%; mild MR; LA mod-severely dilated; RA mildly dilated; RV systolic function mildly reduced; RV systolic pressure increase - mod pulm htn    Family History  Problem Relation Age of Onset  . Alzheimer's disease Mother   . Heart disease Father     Allergies  Allergen Reactions  . Nsaids   . Other     Opiates cause tightness in chest  . Vicodin [Hydrocodone-Acetaminophen] Other (See Comments)    Unknown reaction many years ago  . Morphine And Related Nausea And Vomiting    Current Outpatient Prescriptions on File Prior to Visit  Medication Sig Dispense Refill  . acetaminophen (TYLENOL) 500 MG tablet Take 500 mg by mouth every 8 (eight) hours as needed for pain (with tramadol). Takes with Tramadol      . amiodarone (PACERONE) 200 MG tablet Take 1 tablet (200 mg total) by mouth daily.  30 tablet  5  .  bumetanide (BUMEX) 1 MG tablet Take 0.5 mg by mouth 2 (two) times daily. Take 1 mg daily on Mon, Wed, Fri, Sat, and Sun. Take 2 tablets Tues and Thurs.      . Calcium Carbonate-Vitamin D (CALCIUM 600+D) 600-400 MG-UNIT per tablet Take 1 tablet by mouth 3 (three) times a week.       . cetirizine (ZYRTEC) 10 MG tablet Take 10 mg by mouth as needed for allergies.      [name] diclofenac sodium (VOLTAREN) 1 % GEL Apply 2 g topically as needed (to affected area for pain).      [name] gabapentin (NEURONTIN) 300 MG capsule Take 2 capsules (600 mg total) by mouth 3 (three) times daily.  540 capsule  3  .  Multiple Vitamin (MULTIVITAMIN) tablet Take 1 tablet by mouth daily.      . Multiple Vitamins-Minerals (OCUVITE PRESERVISION) TABS Take 2 tablets by mouth 2 (two) times daily.       [name] omeprazole (PRILOSEC) 20 MG capsule TAKE ONE CAPSULE BY MOUTH EVERY DAY  90 capsule  3  . Rivaroxaban (XARELTO) 20 MG TABS Take 1 tablet (20 mg total) by mouth daily. Start on 9/27  30 tablet  3  . rosuvastatin (CRESTOR) 20 MG tablet Take 20 mg by mouth once a week. On Fridays      . SPIRIVA HANDIHALER 18 MCG inhalation capsule PLACE ONE CAPSULE INTO INHALER AND INHALE DAILY  30 capsule  0  . tamsulosin (FLOMAX) 0.4 MG CAPS capsule Take 1 capsule (0.4 mg total) by mouth daily.  30 capsule  9  . traMADol (ULTRAM) 50 MG tablet Take 1 tablet (50 mg total) by mouth every 8 (eight) hours as needed.  50 tablet  1  . VOLTAREN 1 % GEL APPLY 2 G TOPICALLY 4 TIMES DAILY  100 g  3  . XARELTO 20 MG TABS tablet TAKE ONE TABLET BY MOUTH EVERY DAY START  ON  9/27  30 tablet  0  . zolpidem (AMBIEN) 5 MG tablet Take 1 tablet (5 mg total) by mouth at bedtime as needed for sleep.  30 tablet  5   No current facility-administered medications on file prior to visit.    BP 144/80  Pulse 76  Temp(Src) 98 F (36.7 C)  Resp 18  Ht 5\' 9"  (1.753 m)  Wt 204 lb (92.534 kg)  BMI 30.11 kg/m2       Objective:   Physical Exam  Nursing note reviewed. Constitutional: He appears well-developed and well-nourished.  HENT:  Head: Normocephalic and atraumatic.  Eyes: Conjunctivae are normal. Pupils are equal, round, and reactive to light.  Neck: Normal range of motion. Neck supple.  Cardiovascular: Normal rate and regular rhythm.   Pulmonary/Chest: Effort normal and breath sounds normal.  Abdominal: Soft. Bowel sounds are normal.  Musculoskeletal: He exhibits edema and tenderness.          Assessment & Plan:

## 2013-06-02 ENCOUNTER — Other Ambulatory Visit: Payer: Self-pay | Admitting: Cardiology

## 2013-06-04 ENCOUNTER — Ambulatory Visit (INDEPENDENT_AMBULATORY_CARE_PROVIDER_SITE_OTHER)
Admission: RE | Admit: 2013-06-04 | Discharge: 2013-06-04 | Disposition: A | Discharge status: Home / Self Care | Payer: Medicare Other | Source: Ambulatory Visit | Attending: Internal Medicine | Admitting: Internal Medicine

## 2013-06-04 ENCOUNTER — Other Ambulatory Visit: Payer: Self-pay | Admitting: *Deleted

## 2013-06-04 DIAGNOSIS — M549 Dorsalgia, unspecified: Secondary | ICD-10-CM

## 2013-06-04 DIAGNOSIS — G8929 Other chronic pain: Secondary | ICD-10-CM

## 2013-06-04 NOTE — Telephone Encounter (Signed)
Rx was sent to pharmacy electronically. 

## 2013-06-18 ENCOUNTER — Encounter: Payer: Self-pay | Admitting: Internal Medicine

## 2013-06-19 ENCOUNTER — Encounter: Payer: Self-pay | Admitting: Internal Medicine

## 2013-06-19 ENCOUNTER — Ambulatory Visit (INDEPENDENT_AMBULATORY_CARE_PROVIDER_SITE_OTHER): Payer: Medicare Other | Admitting: Internal Medicine

## 2013-06-19 VITALS — BP 102/50 | HR 62 | Ht 68.0 in | Wt 201.7 lb

## 2013-06-19 DIAGNOSIS — Z01810 Encounter for preprocedural cardiovascular examination: Secondary | ICD-10-CM

## 2013-06-19 DIAGNOSIS — I428 Other cardiomyopathies: Secondary | ICD-10-CM

## 2013-06-19 DIAGNOSIS — I5021 Acute systolic (congestive) heart failure: Secondary | ICD-10-CM

## 2013-06-19 DIAGNOSIS — E785 Hyperlipidemia, unspecified: Secondary | ICD-10-CM

## 2013-06-19 DIAGNOSIS — I429 Cardiomyopathy, unspecified: Secondary | ICD-10-CM

## 2013-06-19 DIAGNOSIS — I509 Heart failure, unspecified: Secondary | ICD-10-CM

## 2013-06-19 DIAGNOSIS — I4891 Unspecified atrial fibrillation: Secondary | ICD-10-CM

## 2013-06-19 DIAGNOSIS — I10 Essential (primary) hypertension: Secondary | ICD-10-CM

## 2013-06-19 NOTE — Patient Instructions (Signed)

## 2013-06-19 NOTE — Progress Notes (Signed)
06/19/2013 [name]   Jan 20, 1927  [id number]    Primary Physicia [name] MewJENKINS,[name], MD Primary Cardiologist: Dr [name] GoldenHilty  HPI: 80 y/o with a history of AF. We saw him in Feb 2014 and attempted cardioversion then but he failed to convert. He was put on Amiodarone and cardioverted successfully in April 2014. He has been on Amiodarone and Xarelto. He saw Corine ShelterLuke [name] recently with complaints of near syncopal episodes. He describes a "flushed feeling" with weakness. His HR at home is in the 50s. He has noted more of these episodes when he is trying to urinate. [name] MachoLuke had stopped his digoxin and lower his dose of amiodarone to 200 mg daily. Since that time his symptoms have improved significantly, however he has had some episodes of low blood pressure. At that time he was diagnosed with atrial fibrillation his EF was 40%, suspect it may be improved as it may been tachycardia related. He continues on Bumex 1 mg daily for most days of the week and 2 or 3 days a week takes it twice daily. This may however be too much diuretic for him.  He also wore a cardia monitor between 12/06/2012 at 12/19/2012. During the 315 hours of monitoring, there was only sinus rhythm and a short episode of sinus bradycardia at 59 with a first degree AV block and intraventricular conduction delay. No pauses or atrial fibrillation were noted.  As her [name] returns today and is feeling quite well. He denies any chest pain worsening shortness of breath or lower extremity swelling. He appears to be maintaining sinus rhythm on amiodarone today at rate of 62.  He is still having some problems with low normal blood pressure. His tamsulosin was discontinued by his urologist and switch to a different medication, but he still had problems with his prostate. He was then placed back on tamsulosin.  EF at his last echocardiogram in February 2014 was 40%.  Current Outpatient Prescriptions  Medication Sig Dispense Refill  . acetaminophen  (TYLENOL) 500 MG tablet Take 500 mg by mouth every 8 (eight) hours as needed for pain (with tramadol). Takes with Tramadol      . amiodarone (PACERONE) 200 MG tablet TAKE ONE TABLET BY MOUTH ONCE DAILY  30 tablet  7  . bumetanide (BUMEX) 1 MG tablet Take 0.5 mg by mouth 2 (two) times daily. Take 1/2 TABLET  Twice a day every day except WED and SAT 1 AND 1/2 TABLETS      . Calcium Carbonate-Vitamin D (CALCIUM 600+D) 600-400 MG-UNIT per tablet Take 1 tablet by mouth 3 (three) times a week.       . cetirizine (ZYRTEC) 10 MG tablet Take 10 mg by mouth as needed for allergies.      [name]. diclofenac sodium (VOLTAREN) 1 % GEL Apply 2 g topically as needed (to affected area for pain).      [name]. gabapentin (NEURONTIN) 300 MG capsule Take 600 mg in am, 600 mg in evening,900 mg at bedtime      . Magnesium 250 MG TABS Take by mouth.      . Multiple Vitamin (MULTIVITAMIN) tablet Take 1 tablet by mouth daily.      . Multiple Vitamins-Minerals (OCUVITE PRESERVISION) TABS Take 2 tablets by mouth 2 (two) times daily.       [name]. omeprazole (PRILOSEC) 20 MG capsule TAKE ONE CAPSULE BY MOUTH EVERY DAY  90 capsule  3  . Rivaroxaban (XARELTO) 20 MG TABS Take 1 tablet (20 mg total) by mouth  daily. Start on 9/27  30 tablet  3  . rosuvastatin (CRESTOR) 20 MG tablet Take 20 mg by mouth once a week. On Fridays      . SPIRIVA HANDIHALER 18 MCG inhalation capsule PLACE ONE CAPSULE INTO INHALER AND INHALE DAILY  30 capsule  0  . tamsulosin (FLOMAX) 0.4 MG CAPS capsule Take 1 capsule (0.4 mg total) by mouth daily.  30 capsule  9  . traMADol (ULTRAM) 50 MG tablet Take 1 tablet (50 mg total) by mouth every 8 (eight) hours as needed.  50 tablet  1  . VOLTAREN 1 % GEL APPLY 2 G TOPICALLY 4 TIMES DAILY  100 g  3  . XARELTO 20 MG TABS tablet TAKE ONE TABLET BY MOUTH EVERY DAY START  ON  9/27  30 tablet  0  . zolpidem (AMBIEN) 5 MG tablet Take 1 tablet (5 mg total) by mouth at bedtime as needed for sleep.  30 tablet  5  . gabapentin (NEURONTIN)  400 MG capsule        No current facility-administered medications for this visit.    Allergies  Allergen Reactions  . Nsaids   . Other     Opiates cause tightness in chest  . Vicodin [Hydrocodone-Acetaminophen] Other (See Comments)    Unknown reaction many years ago  . Morphine And Related Nausea And Vomiting    History   Social History  . Marital Status: Widowed    Spouse Name: N/A    Number of Children: N/A  . Years of Education: N/A   Occupational History  . retired    Social History Main Topics  . Smoking status: Former Smoker    Quit date: 05/17/1964  . Smokeless tobacco: Never Used  . Alcohol Use: No  . Drug Use: No  . Sexual Activity: Not Currently   Other Topics Concern  . Not on file   Social History Narrative  . No narrative on file     Review of Systems: General: negative for chills, fever, night sweats or weight changes.  Cardiovascular: negative for chest pain, dyspnea on exertion, edema, orthopnea, palpitations, paroxysmal nocturnal dyspnea or shortness of breath Dermatological: negative for rash Respiratory: negative for cough or wheezing Urologic: negative for hematuria Abdominal: negative for nausea, vomiting, diarrhea, bright red blood per rectum, melena, or hematemesis Neurologic: negative for visual changes, syncope, or dizziness All other systems reviewed and are otherwise negative except as noted above.    Blood pressure 102/50, pulse 62, height 5\' 8"  (1.727 m), weight 201 lb 11.2 oz (91.491 kg).  General appearance: alert, no distress and pale Neck: no carotid bruit and no JVD Lungs: clear to auscultation bilaterally Heart: regular rate and rhythm Abdomen: soft, non-tender; bowel sounds normal; no masses,  no organomegaly Extremities: edema trace Pulses: 2+ and symmetric Skin: Skin color, texture, turgor normal. No rashes or lesions Neurologic: Mental status: Alert, oriented, thought content appropriate, in wheelchair psych: Mood,  affect normal  EKG  Normal sinus at 62, Bifascicular block  ASSESSMENT AND PLAN:   Patient Active Problem List   Diagnosis Date Noted  . Chronic anticoagulation 12/06/2012  . Near syncope 12/06/2012  . Lower extremity edema 07/05/2012  . Acute systolic CHF (congestive heart failure), NYHA class 3 -- Unclear etilogy (? Afib related) EF down from 60-70% to 40%. 07/05/2012  . General weakness 07/05/2012  . Paroxysmal Atrial fibrillation - admitted with RVR 01/18/2012  . DJD (degenerative joint disease), lumbar 01/18/2012  . Hx of colonic  polyps 11/02/2011  . BENIGN PROSTATIC HYPERTROPHY 05/13/2010  . SEBORRHEA CAPITIS 02/13/2010  . History of elevated glucose 10/01/2009  . CHRONIC RHINITIS 06/02/2009  . HYPERLIPIDEMIA 11/01/2008  . NASAL POLYP 07/03/2008  . INSOMNIA, CHRONIC, MILD 01/02/2008  . HYPERTENSION 02/06/2007  . ASTHMA 02/06/2007    PLAN   Mr. Parton is doing better and maintaining sinus rhythm on low dose amiodarone. I would like to repeat an echocardiogram his EF hopefully has recovered from his most recent episode of rapid A. Fib. Otherwise we'll continue his current medications and plan to see him back in 6 months.  Chrystie Nose, MD, [facility] Attending Cardiologist [facility]   Khrystyna Schwalm C 06/19/2013 5:39 PM

## 2013-06-26 ENCOUNTER — Ambulatory Visit (HOSPITAL_COMMUNITY)
Admission: RE | Admit: 2013-06-26 | Discharge: 2013-06-26 | Disposition: A | Discharge status: Home / Self Care | Payer: Medicare Other | Source: Ambulatory Visit | Attending: Cardiovascular Disease | Admitting: Cardiovascular Disease

## 2013-06-26 DIAGNOSIS — I429 Cardiomyopathy, unspecified: Secondary | ICD-10-CM

## 2013-06-26 DIAGNOSIS — I059 Rheumatic mitral valve disease, unspecified: Secondary | ICD-10-CM | POA: Insufficient documentation

## 2013-06-26 DIAGNOSIS — I369 Nonrheumatic tricuspid valve disorder, unspecified: Secondary | ICD-10-CM

## 2013-06-26 DIAGNOSIS — I079 Rheumatic tricuspid valve disease, unspecified: Secondary | ICD-10-CM | POA: Insufficient documentation

## 2013-06-26 DIAGNOSIS — Z01810 Encounter for preprocedural cardiovascular examination: Secondary | ICD-10-CM

## 2013-06-26 DIAGNOSIS — I509 Heart failure, unspecified: Secondary | ICD-10-CM | POA: Insufficient documentation

## 2013-06-26 DIAGNOSIS — I4891 Unspecified atrial fibrillation: Secondary | ICD-10-CM | POA: Insufficient documentation

## 2013-06-26 DIAGNOSIS — I10 Essential (primary) hypertension: Secondary | ICD-10-CM | POA: Insufficient documentation

## 2013-06-26 DIAGNOSIS — E785 Hyperlipidemia, unspecified: Secondary | ICD-10-CM | POA: Insufficient documentation

## 2013-06-26 DIAGNOSIS — I428 Other cardiomyopathies: Secondary | ICD-10-CM | POA: Insufficient documentation

## 2013-06-26 NOTE — Progress Notes (Signed)
2D Echo Performed 06/26/2013    Raeanne Deschler, RCS  

## 2013-06-29 ENCOUNTER — Other Ambulatory Visit: Payer: Self-pay | Admitting: Adult Health

## 2013-07-02 ENCOUNTER — Other Ambulatory Visit: Payer: Self-pay | Admitting: *Deleted

## 2013-07-02 ENCOUNTER — Other Ambulatory Visit: Payer: Self-pay | Admitting: Adult Health

## 2013-07-02 ENCOUNTER — Telehealth: Payer: Self-pay | Admitting: Internal Medicine

## 2013-07-02 MED ORDER — TRAMADOL HCL 50 MG PO TABS: 50.0000 mg | ORAL_TABLET | Freq: Three times a day (TID) | ORAL | Status: DC | PRN

## 2013-07-02 NOTE — Telephone Encounter (Signed)
Called in.

## 2013-07-02 NOTE — Telephone Encounter (Signed)
Pt son is calling in regards to pt meds, states pharmacy is faxing over the rx this morning, pt is requesting a 30 day supply on the traMADol (ULTRAM) 50 MG tablet, states the pharmacy informed him that it was not written on the rx.

## 2013-07-04 NOTE — Progress Notes (Signed)
LMTCB

## 2013-07-05 ENCOUNTER — Telehealth: Payer: Self-pay | Admitting: Internal Medicine

## 2013-07-05 ENCOUNTER — Telehealth: Payer: Self-pay | Admitting: *Deleted

## 2013-07-05 MED ORDER — BUMETANIDE 1 MG PO TABS: ORAL_TABLET | ORAL | Status: DC

## 2013-07-05 NOTE — Telephone Encounter (Signed)
I will send clearance.  Dr. Rennis Golden

## 2013-07-05 NOTE — Telephone Encounter (Signed)
Returning your call from yesterday,concerning his test results. °

## 2013-07-05 NOTE — Telephone Encounter (Signed)
Message copied by Lindell Spar on Thu Jul 05, 2013  9:49 AM ------      Message from: Chrystie Nose      Created: Wed Jul 04, 2013  9:53 AM       Echo shows elevated filling pressures, normal systolic function, mild to moderate pulmonary hypertension. No worries.            -Dr. Rennis Golden ------

## 2013-07-05 NOTE — Telephone Encounter (Signed)
Returned call. Results given. Will inquire of Dr. Rennis Golden regarding clearance being sent to Dr. Charlann Boxer, per daughter, Kandra Nicolas, request

## 2013-07-05 NOTE — Telephone Encounter (Signed)
Called echo results to daughter, Kandra Nicolas. Verbalized understanding She asked if results/clearance were to be sent to Dr. Charlann Boxer for her father's potential knee replacement. - will defer to Dr. Rennis Golden.   Asked for Bumex refill. Confirmed dosage & instructions with daughter. Rx was sent to pharmacy electronically.

## 2013-07-20 ENCOUNTER — Telehealth: Payer: Self-pay | Admitting: Internal Medicine

## 2013-07-20 NOTE — Telephone Encounter (Signed)
Pt son stated tramadol twice a day is not working. Pt son stated the rx was originally three times a day. Please call [facility] [address]. Pt is not due for refill until 07-29-13. please  Change rx to three times a day #90 for 30 day supply. Pt son is aware md out of office

## 2013-07-23 MED ORDER — TRAMADOL HCL 50 MG PO TABS: 50.0000 mg | ORAL_TABLET | Freq: Three times a day (TID) | ORAL | Status: DC | PRN

## 2013-07-23 NOTE — Telephone Encounter (Signed)
rx called in, pt aware 

## 2013-07-23 NOTE — Telephone Encounter (Signed)
May change to tid

## 2013-07-24 ENCOUNTER — Telehealth: Payer: Self-pay | Admitting: *Deleted

## 2013-07-24 NOTE — Telephone Encounter (Signed)
Faxed clearance to [facility] - ok to stop xarelto 2 days prior to surgery

## 2013-08-02 ENCOUNTER — Encounter (HOSPITAL_COMMUNITY): Payer: Self-pay | Admitting: Pharmacy Technician

## 2013-08-06 ENCOUNTER — Encounter (HOSPITAL_COMMUNITY)
Admission: RE | Admit: 2013-08-06 | Discharge: 2013-08-06 | Disposition: A | Discharge status: Home / Self Care | Payer: Medicare Other | Source: Ambulatory Visit | Attending: Orthopedic Surgery | Admitting: Orthopedic Surgery

## 2013-08-06 ENCOUNTER — Encounter (HOSPITAL_COMMUNITY): Payer: Self-pay

## 2013-08-06 ENCOUNTER — Ambulatory Visit (HOSPITAL_COMMUNITY)
Admission: RE | Admit: 2013-08-06 | Discharge: 2013-08-06 | Disposition: A | Discharge status: Home / Self Care | Payer: Medicare Other | Source: Ambulatory Visit | Attending: Anesthesiology | Admitting: Anesthesiology

## 2013-08-06 DIAGNOSIS — H3530 Unspecified macular degeneration: Secondary | ICD-10-CM

## 2013-08-06 DIAGNOSIS — J438 Other emphysema: Secondary | ICD-10-CM | POA: Insufficient documentation

## 2013-08-06 DIAGNOSIS — I517 Cardiomegaly: Secondary | ICD-10-CM | POA: Insufficient documentation

## 2013-08-06 DIAGNOSIS — Z01812 Encounter for preprocedural laboratory examination: Secondary | ICD-10-CM | POA: Insufficient documentation

## 2013-08-06 DIAGNOSIS — I700 Atherosclerosis of aorta: Secondary | ICD-10-CM | POA: Insufficient documentation

## 2013-08-06 DIAGNOSIS — M949 Disorder of cartilage, unspecified: Secondary | ICD-10-CM

## 2013-08-06 DIAGNOSIS — M899 Disorder of bone, unspecified: Secondary | ICD-10-CM | POA: Insufficient documentation

## 2013-08-06 DIAGNOSIS — Z01818 Encounter for other preprocedural examination: Secondary | ICD-10-CM | POA: Insufficient documentation

## 2013-08-06 HISTORY — DX: Unspecified macular degeneration: H35.30

## 2013-08-06 LAB — URINALYSIS, ROUTINE W REFLEX MICROSCOPIC
Bilirubin Urine: NEGATIVE
Glucose, UA: NEGATIVE mg/dL
Hgb urine dipstick: NEGATIVE
KETONES UR: NEGATIVE mg/dL
NITRITE: POSITIVE — AB
PROTEIN: NEGATIVE mg/dL
Specific Gravity, Urine: 1.021 (ref 1.005–1.030)
Urobilinogen, UA: 0.2 mg/dL (ref 0.0–1.0)
pH: 5.5 (ref 5.0–8.0)

## 2013-08-06 LAB — CBC
HEMATOCRIT: 32.1 % — AB (ref 39.0–52.0)
HEMOGLOBIN: 10.7 g/dL — AB (ref 13.0–17.0)
MCH: 31.3 pg (ref 26.0–34.0)
MCHC: 33.3 g/dL (ref 30.0–36.0)
MCV: 93.9 fL (ref 78.0–100.0)
Platelets: 286 10*3/uL (ref 150–400)
RBC: 3.42 MIL/uL — AB (ref 4.22–5.81)
RDW: 14.7 % (ref 11.5–15.5)
WBC: 7 10*3/uL (ref 4.0–10.5)

## 2013-08-06 LAB — URINE MICROSCOPIC-ADD ON

## 2013-08-06 LAB — PROTIME-INR
INR: 1.62 — AB (ref 0.00–1.49)
Prothrombin Time: 18.8 seconds — ABNORMAL HIGH (ref 11.6–15.2)

## 2013-08-06 LAB — BASIC METABOLIC PANEL
BUN: 17 mg/dL (ref 6–23)
CHLORIDE: 96 meq/L (ref 96–112)
CO2: 32 mEq/L (ref 19–32)
Calcium: 9.6 mg/dL (ref 8.4–10.5)
Creatinine, Ser: 0.81 mg/dL (ref 0.50–1.35)
GFR, EST NON AFRICAN AMERICAN: 79 mL/min — AB (ref >90)
Glucose, Bld: 99 mg/dL (ref 70–99)
POTASSIUM: 3.8 meq/L (ref 3.7–5.3)
SODIUM: 139 meq/L (ref 137–147)

## 2013-08-06 LAB — ABO/RH: ABO/RH(D): A NEG

## 2013-08-06 LAB — SURGICAL PCR SCREEN
MRSA, PCR: NEGATIVE
Staphylococcus aureus: POSITIVE — AB

## 2013-08-06 LAB — APTT: APTT: 57 s — AB (ref 24–37)

## 2013-08-06 NOTE — Patient Instructions (Signed)
20 [name]  08/06/2013   Your procedure is scheduled on:  08-13-2013  Report to [facility] at      0730  AM .  Call this number if you have problems the morning of surgery: [phone]  Or Presurgical Testing [phone](Nathanuel Cabreja) For Living Will and/or Health Care Power Attorney Forms: please provide copy for your medical record,may bring AM of surgery(Forms should be already notarized -we do not provide this service).(08-06-13 Documents placed with medical records today)     Do not eat food:After Midnight.   Take these medicines the morning of surgery with A SIP OF WATER: Pacerone. Gabapentin. Omeprazole.Tramadol.  Citirizine if needed. Use/ bring Inhaler-Spiriva Turdoza, Albuterol.   Do not wear jewelry, make-up or nail polish.  Do not wear lotions, powders, or perfumes. You may wear deodorant.  Do not shave 48 hours(2 days) prior to first CHG shower(legs and under arms).(Shaving face and neck okay.)  Do not bring valuables to the hospital.(Hospital is not responsible for lost valuables).  Contacts, dentures or removable bridgework, body piercing, hair pins may not be worn into surgery.  Leave suitcase in the car. After surgery it may be brought to your room.  For patients admitted to the hospital, checkout time is 11:00 AM the day of discharge.(Restricted visitors-Any Persons displaying flu-like symptoms or illness).    Patients discharged the day of surgery will not be allowed to drive home. Must have responsible person with you x 24 hours once discharged.  Name and phone number of your driver: Mary Land-daughter [phone] w  Special Instructions: CHG(Chlorhedine 4%-"Hibiclens","Betasept","Aplicare") Shower Use Special Wash: see special instructions.(avoid face and genitals)   Please read over the following fact sheets that you were given: MRSA Information, Blood Transfusion fact sheet, Incentive Spirometry Instruction.  Remember : Type/Screen  "Blue armbands" - may not be removed once applied(would result in being retested AM of surgery, if removed).  Failure to follow these instructions may result in Cancellation of your surgery.   Patient signature_______________________________________________________

## 2013-08-06 NOTE — Progress Notes (Signed)
08-06-13 1525 PCR screen Positive for Staph aureus -Mupirocin Oint called to [facility] [phone] , pt. Aware to use as directed.

## 2013-08-06 NOTE — Pre-Procedure Instructions (Addendum)
08-06-13 EKG / Echo 2'15 Epic. [facility] clearance 06-19-13 Dr. Rennis Golden- with chart. CXR done today. 08-06-13 1230 Note in Epic to Dr. Milas Hock viewable labs in Epic. 08-06-13 1525 Dr. Nilsa Nutting office [phone] and pat. Notified of Positive Staph aureus PCR. Mupirocin called to [facility] [phone]. WKennon Portela. 08-08-13 received fax -Cipro 500mg  twice daily x 5 days called in for patient per Glori Bickers. Olin's office. 08-09-13 1600 Patient daughter Kandra Nicolas made aware of surgery time changed to 1230, to arrive by 0930 AM,other instructions are same and they are aware of procedure change : now surgery to be Left Hip Total Arthroplasty.W. Kennon Portela

## 2013-08-06 NOTE — Progress Notes (Signed)
08-06-13 1230 labs viewable in Epic-please note all. Pt. Takes Xarelto, has instructions to stop prior to surgery, will recheck PT/ INR AM of surgery.W. Kennon Portela

## 2013-08-09 ENCOUNTER — Other Ambulatory Visit: Payer: Self-pay

## 2013-08-09 MED ORDER — RIVAROXABAN 20 MG PO TABS: 20.0000 mg | ORAL_TABLET | Freq: Every day | ORAL | Status: DC

## 2013-08-09 NOTE — Telephone Encounter (Signed)
Patient walked in requesting samples of Xarelto. Samples given to patient.   7 bottles (5 tablets each) LOT - [id number]; EXP - 12/2015

## 2013-08-10 ENCOUNTER — Telehealth: Payer: Self-pay | Admitting: Internal Medicine

## 2013-08-10 ENCOUNTER — Other Ambulatory Visit: Payer: Self-pay | Admitting: Internal Medicine

## 2013-08-10 NOTE — Telephone Encounter (Signed)
This was done yesterday.  

## 2013-08-10 NOTE — Telephone Encounter (Signed)
Pt needs a refill voltaren gel call into [facility] [address]

## 2013-08-12 NOTE — H&P (Signed)
TOTAL HIP ADMISSION H&P  Patient is admitted for left total hip arthroplasty, anterior approach.  Subjective:  Chief Complaint:     Left hip OA / pain  HPI: [name], 80 y.o. male, has a history of pain and functional disability in the left hip(s) due to arthritis and patient has failed non-surgical conservative treatments for greater than 12 weeks to include NSAID's and/or analgesics, corticosteriod injections, use of assistive devices and activity modification.  Onset of symptoms was gradual starting 6+ years ago with gradually worsening course since that time.  He also have significant OA and pain in the left knee. The pain is 10/10 in the left knee as well.  He was being worked up for knee surgery when he fell and had significant hip pain.  The was preventing him from walking at all the the hip pain outThe patient noted no past surgery on the left hip(s).  Patient currently rates pain in the left hip at 10 out of 10 with activity. Patient has night pain, worsening of pain with activity and weight bearing, trendelenberg gait, pain that interfers with activities of daily living and pain with passive range of motion. Patient has evidence of periarticular osteophytes and joint space narrowing by imaging studies. This condition presents safety issues increasing the risk of falls.  There is no current active infection.  Risks, benefits and expectations were discussed with the patient.  Risks including but not limited to the risk of anesthesia, blood clots, nerve damage, blood vessel damage, failure of the prosthesis, infection and up to and including death.  Patient understand the risks, benefits and expectations and wishes to proceed with surgery.  D/C Plans:   SNF - ([name])  Post-op Meds:     No Rx given   Tranexamic Acid:   Not to be given - CAD  Decadron:    To be given  FYI:    Xarelto 10 mg x 2 days, the 20 mg on day 3.  Fentanyl vs Dilaudid    Patient Active Problem  List   Diagnosis Date Noted  . Chronic anticoagulation 12/06/2012  . Near syncope 12/06/2012  . Lower extremity edema 07/05/2012  . Acute systolic CHF (congestive heart failure), NYHA class 3 -- Unclear etilogy (? Afib related) EF down from 60-70% to 40%. 07/05/2012  . General weakness 07/05/2012  . Paroxysmal Atrial fibrillation - admitted with RVR 01/18/2012  . DJD (degenerative joint disease), lumbar 01/18/2012  . Hx of colonic polyps 11/02/2011  . BENIGN PROSTATIC HYPERTROPHY 05/13/2010  . SEBORRHEA CAPITIS 02/13/2010  . History of elevated glucose 10/01/2009  . CHRONIC RHINITIS 06/02/2009  . HYPERLIPIDEMIA 11/01/2008  . NASAL POLYP 07/03/2008  . INSOMNIA, CHRONIC, MILD 01/02/2008  . HYPERTENSION 02/06/2007  . ASTHMA 02/06/2007   Past Medical History  Diagnosis Date  . Obesity   . Hypertension   . Edema     Bilateral - left lower leg greater than right- wears compression stockings  . BPH (benign prostatic hypertrophy)   . Asthma   . Bronchospasm, exercise-induced   . CHF (congestive heart failure)     systolic, EF 40% (07/07/2012)  . GERD (gastroesophageal reflux disease)   . A-fib   . Shortness of breath   . Dyslipidemia   . Arthritis     knees  . Chronic kidney disease     stabilized, due to infection  . Macular degeneration 08-06-13    bilateral -sight impaired    Past Surgical History  Procedure Laterality Date  . Vascular surgery  removed varicose veins  . Vasectomy    . Cardioversion N/A 07/07/2012    Procedure: CARDIOVERSION;  Surgeon: Chrystie NoseKenneth C. Hilty, MD;  [facility];  Service: Cardiovascular;  Laterality: N/A;  . Cardioversion N/A 08/30/2012    Procedure: CARDIOVERSION;  Surgeon: Chrystie NoseKenneth C. Hilty, MD;  [facility];  Service: Cardiovascular;  Laterality: N/A;  . Transthoracic echocardiogram  07/07/2012    ef 40%; mild MR; LA mod-severely dilated; RA mildly dilated; RV systolic function mildly reduced; RV systolic pressure increase -  mod pulm htn    No prescriptions prior to admission   Allergies  Allergen Reactions  . Nsaids     Stomach pain  . Other     Opiates cause tightness in chest  . Vicodin [Hydrocodone-Acetaminophen] Other (See Comments)    Unknown reaction many years ago  . Morphine And Related Nausea And Vomiting    Dizziness, light headed     History  Substance Use Topics  . Smoking status: Former Smoker    Quit date: 05/17/1964  . Smokeless tobacco: Never Used  . Alcohol Use: No    Family History  Problem Relation Age of Onset  . Alzheimer's disease Mother   . Heart disease Father      Review of Systems  HENT: Negative.   Eyes: Negative.   Respiratory: Positive for shortness of breath.   Cardiovascular: Positive for leg swelling.  Gastrointestinal: Positive for heartburn.  Genitourinary: Positive for frequency.  Musculoskeletal: Positive for joint pain.  Skin: Negative.   Neurological: Positive for weakness.  Endo/Heme/Allergies: Negative.   Psychiatric/Behavioral: The patient has insomnia.     Objective:  Physical Exam  Constitutional: He is oriented to person, place, and time. He appears well-developed and well-nourished.  HENT:  Head: Normocephalic and atraumatic.  Mouth/Throat: Oropharynx is clear and moist.  Eyes: Pupils are equal, round, and reactive to light.  Neck: Neck supple. No JVD present. No tracheal deviation present. No thyromegaly present.  Cardiovascular: Normal rate, regular rhythm, normal heart sounds and intact distal pulses.   Respiratory: Effort normal and breath sounds normal. No stridor. No respiratory distress. He has no wheezes.  GI: Soft. There is no tenderness. There is no guarding.  Musculoskeletal:       Left hip: He exhibits decreased range of motion, decreased strength, tenderness and bony tenderness. He exhibits no swelling, no deformity and no laceration.       Left knee: He exhibits decreased range of motion, swelling and bony tenderness. He  exhibits no ecchymosis, no deformity, no laceration and no erythema. Tenderness found.  Lymphadenopathy:    He has no cervical adenopathy.  Neurological: He is alert and oriented to person, place, and time.  Skin: Skin is warm and dry.  Psychiatric: He has a normal mood and affect.     Labs:  Estimated body mass index is 30.68 kg/(m^2) as calculated from the following:   Height as of 06/19/13: 5\' 8"  (1.727 m).   Weight as of 06/19/13: 91.491 kg (201 lb 11.2 oz).   Imaging Review Plain radiographs demonstrate severe degenerative joint disease of the left hip(s). The bone quality appears to be good for age and reported activity level.  Assessment/Plan:  End stage arthritis, left hip(s)  The patient history, physical examination, clinical judgement of the provider and imaging studies are consistent with end stage degenerative joint disease of the left hip(s) and total hip arthroplasty is deemed medically necessary. The treatment  options including medical management, injection therapy, arthroscopy and arthroplasty were discussed at length. The risks and benefits of total hip arthroplasty were presented and reviewed. The risks due to aseptic loosening, infection, stiffness, dislocation/subluxation,  thromboembolic complications and other imponderables were discussed.  The patient acknowledged the explanation, agreed to proceed with the plan and consent was signed. Patient is being admitted for inpatient treatment for surgery, pain control, PT, OT, prophylactic antibiotics, VTE prophylaxis, progressive ambulation and ADL's and discharge planning.The patient is planning to be discharged to skilled nursing facility.    [name]   PAC  08/12/2013, 6:09 PM

## 2013-08-13 ENCOUNTER — Encounter (HOSPITAL_COMMUNITY): Payer: Self-pay | Admitting: *Deleted

## 2013-08-13 ENCOUNTER — Inpatient Hospital Stay (HOSPITAL_COMMUNITY): Payer: Medicare Other

## 2013-08-13 ENCOUNTER — Inpatient Hospital Stay (HOSPITAL_COMMUNITY)
Admission: RE | Admit: 2013-08-13 | Discharge: 2013-08-15 | DRG: 470 | Disposition: A | Discharge status: Skilled Nursing Facility | Payer: Medicare Other | Source: Ambulatory Visit | Attending: Orthopedic Surgery | Admitting: Orthopedic Surgery

## 2013-08-13 ENCOUNTER — Inpatient Hospital Stay (HOSPITAL_COMMUNITY): Payer: Medicare Other | Admitting: Anesthesiology

## 2013-08-13 ENCOUNTER — Encounter (HOSPITAL_COMMUNITY)
Admission: RE | Disposition: A | Discharge status: Skilled Nursing Facility | Payer: Self-pay | Source: Ambulatory Visit | Attending: Orthopedic Surgery

## 2013-08-13 ENCOUNTER — Encounter (HOSPITAL_COMMUNITY): Payer: Medicare Other | Admitting: Anesthesiology

## 2013-08-13 DIAGNOSIS — N189 Chronic kidney disease, unspecified: Secondary | ICD-10-CM | POA: Diagnosis present

## 2013-08-13 DIAGNOSIS — I2510 Atherosclerotic heart disease of native coronary artery without angina pectoris: Secondary | ICD-10-CM | POA: Diagnosis present

## 2013-08-13 DIAGNOSIS — H3530 Unspecified macular degeneration: Secondary | ICD-10-CM | POA: Diagnosis present

## 2013-08-13 DIAGNOSIS — M1610 Unilateral primary osteoarthritis, unspecified hip: Principal | ICD-10-CM | POA: Diagnosis present

## 2013-08-13 DIAGNOSIS — I4891 Unspecified atrial fibrillation: Secondary | ICD-10-CM | POA: Diagnosis present

## 2013-08-13 DIAGNOSIS — I129 Hypertensive chronic kidney disease with stage 1 through stage 4 chronic kidney disease, or unspecified chronic kidney disease: Secondary | ICD-10-CM | POA: Diagnosis present

## 2013-08-13 DIAGNOSIS — M25569 Pain in unspecified knee: Secondary | ICD-10-CM | POA: Diagnosis present

## 2013-08-13 DIAGNOSIS — Z96649 Presence of unspecified artificial hip joint: Secondary | ICD-10-CM

## 2013-08-13 DIAGNOSIS — E669 Obesity, unspecified: Secondary | ICD-10-CM | POA: Diagnosis present

## 2013-08-13 DIAGNOSIS — R5383 Other fatigue: Secondary | ICD-10-CM

## 2013-08-13 DIAGNOSIS — Z87891 Personal history of nicotine dependence: Secondary | ICD-10-CM

## 2013-08-13 DIAGNOSIS — D62 Acute posthemorrhagic anemia: Secondary | ICD-10-CM | POA: Diagnosis not present

## 2013-08-13 DIAGNOSIS — Z820 Family history of epilepsy and other diseases of the nervous system: Secondary | ICD-10-CM

## 2013-08-13 DIAGNOSIS — Z7901 Long term (current) use of anticoagulants: Secondary | ICD-10-CM

## 2013-08-13 DIAGNOSIS — K219 Gastro-esophageal reflux disease without esophagitis: Secondary | ICD-10-CM | POA: Diagnosis present

## 2013-08-13 DIAGNOSIS — Z6831 Body mass index (BMI) 31.0-31.9, adult: Secondary | ICD-10-CM

## 2013-08-13 DIAGNOSIS — E876 Hypokalemia: Secondary | ICD-10-CM | POA: Diagnosis not present

## 2013-08-13 DIAGNOSIS — E119 Type 2 diabetes mellitus without complications: Secondary | ICD-10-CM | POA: Diagnosis present

## 2013-08-13 DIAGNOSIS — J45909 Unspecified asthma, uncomplicated: Secondary | ICD-10-CM | POA: Diagnosis present

## 2013-08-13 DIAGNOSIS — G4700 Insomnia, unspecified: Secondary | ICD-10-CM | POA: Diagnosis present

## 2013-08-13 DIAGNOSIS — R5381 Other malaise: Secondary | ICD-10-CM | POA: Diagnosis present

## 2013-08-13 DIAGNOSIS — E785 Hyperlipidemia, unspecified: Secondary | ICD-10-CM | POA: Diagnosis present

## 2013-08-13 DIAGNOSIS — M169 Osteoarthritis of hip, unspecified: Principal | ICD-10-CM | POA: Diagnosis present

## 2013-08-13 DIAGNOSIS — Z8249 Family history of ischemic heart disease and other diseases of the circulatory system: Secondary | ICD-10-CM

## 2013-08-13 HISTORY — DX: Presence of unspecified artificial hip joint: Z96.649

## 2013-08-13 HISTORY — PX: TOTAL HIP ARTHROPLASTY: SHX124

## 2013-08-13 LAB — TYPE AND SCREEN
ABO/RH(D): A NEG
Antibody Screen: NEGATIVE

## 2013-08-13 LAB — PROTIME-INR
INR: 1.13 (ref 0.00–1.49)
PROTHROMBIN TIME: 14.3 s (ref 11.6–15.2)

## 2013-08-13 SURGERY — ARTHROPLASTY, HIP, TOTAL, ANTERIOR APPROACH
Anesthesia: Choice | Site: Hip | Laterality: Left

## 2013-08-13 MED ORDER — PHENYLEPHRINE HCL 10 MG/ML IJ SOLN
INTRAMUSCULAR | Status: DC | PRN
  Administered 2013-08-13 (×2): 80 ug via INTRAVENOUS

## 2013-08-13 MED ORDER — FENTANYL CITRATE 0.05 MG/ML IJ SOLN
INTRAMUSCULAR | Status: AC
  Filled 2013-08-13: qty 5

## 2013-08-13 MED ORDER — FENTANYL CITRATE 0.05 MG/ML IJ SOLN: 50.0000 ug | INTRAMUSCULAR | Status: DC | PRN

## 2013-08-13 MED ORDER — CHLORHEXIDINE GLUCONATE 4 % EX LIQD: 60.0000 mL | Freq: Once | CUTANEOUS | Status: DC

## 2013-08-13 MED ORDER — CEFAZOLIN SODIUM-DEXTROSE 2-3 GM-% IV SOLR
2.0000 g | Freq: Four times a day (QID) | INTRAVENOUS | Status: AC
  Administered 2013-08-13 – 2013-08-14 (×2): 2 g via INTRAVENOUS
  Filled 2013-08-13 (×2): qty 50

## 2013-08-13 MED ORDER — POLYETHYLENE GLYCOL 3350 17 G PO PACK
17.0000 g | PACK | Freq: Two times a day (BID) | ORAL | Status: DC
  Administered 2013-08-14 (×2): 17 g via ORAL
  Filled 2013-08-13 (×5): qty 1

## 2013-08-13 MED ORDER — HYDROMORPHONE HCL PF 1 MG/ML IJ SOLN: 0.2500 mg | INTRAMUSCULAR | Status: DC | PRN

## 2013-08-13 MED ORDER — PROPOFOL INFUSION 10 MG/ML OPTIME
INTRAVENOUS | Status: DC | PRN
  Administered 2013-08-13: 120 ug/kg/min via INTRAVENOUS

## 2013-08-13 MED ORDER — DOCUSATE SODIUM 100 MG PO CAPS
100.0000 mg | ORAL_CAPSULE | Freq: Two times a day (BID) | ORAL | Status: DC
  Administered 2013-08-13 – 2013-08-15 (×4): 100 mg via ORAL
  Filled 2013-08-13 (×5): qty 1

## 2013-08-13 MED ORDER — RIVAROXABAN 20 MG PO TABS: 20.0000 mg | ORAL_TABLET | Freq: Every day | ORAL | Status: DC

## 2013-08-13 MED ORDER — AMIODARONE HCL 200 MG PO TABS
200.0000 mg | ORAL_TABLET | Freq: Every morning | ORAL | Status: DC
  Administered 2013-08-14: 200 mg via ORAL
  Filled 2013-08-13 (×2): qty 1

## 2013-08-13 MED ORDER — GABAPENTIN 800 MG PO TABS
800.0000 mg | ORAL_TABLET | Freq: Three times a day (TID) | ORAL | Status: DC
  Filled 2013-08-13 (×2): qty 1

## 2013-08-13 MED ORDER — PROMETHAZINE HCL 25 MG/ML IJ SOLN: 6.2500 mg | INTRAMUSCULAR | Status: DC | PRN

## 2013-08-13 MED ORDER — TRAMADOL HCL 50 MG PO TABS
50.0000 mg | ORAL_TABLET | Freq: Four times a day (QID) | ORAL | Status: DC
  Administered 2013-08-13 (×2): 100 mg via ORAL
  Administered 2013-08-14: 50 mg via ORAL
  Administered 2013-08-14: 100 mg via ORAL
  Administered 2013-08-14: 50 mg via ORAL
  Administered 2013-08-15: 100 mg via ORAL
  Filled 2013-08-13 (×3): qty 2
  Filled 2013-08-13 (×2): qty 1
  Filled 2013-08-13: qty 2

## 2013-08-13 MED ORDER — 0.9 % SODIUM CHLORIDE (POUR BTL) OPTIME
TOPICAL | Status: DC | PRN
  Administered 2013-08-13: 1000 mL

## 2013-08-13 MED ORDER — POTASSIUM CHLORIDE 2 MEQ/ML IV SOLN
100.0000 mL/h | INTRAVENOUS | Status: DC
  Administered 2013-08-13: 100 mL/h via INTRAVENOUS
  Administered 2013-08-14: 30 mL/h via INTRAVENOUS
  Filled 2013-08-13 (×8): qty 1000

## 2013-08-13 MED ORDER — PHENYLEPHRINE 40 MCG/ML (10ML) SYRINGE FOR IV PUSH (FOR BLOOD PRESSURE SUPPORT)
PREFILLED_SYRINGE | INTRAVENOUS | Status: AC
  Filled 2013-08-13: qty 10

## 2013-08-13 MED ORDER — METHOCARBAMOL 100 MG/ML IJ SOLN
500.0000 mg | Freq: Four times a day (QID) | INTRAVENOUS | Status: DC | PRN
  Filled 2013-08-13: qty 5

## 2013-08-13 MED ORDER — METHOCARBAMOL 500 MG PO TABS: 500.0000 mg | ORAL_TABLET | Freq: Four times a day (QID) | ORAL | Status: DC | PRN

## 2013-08-13 MED ORDER — ONDANSETRON HCL 4 MG/2ML IJ SOLN
INTRAMUSCULAR | Status: AC
  Filled 2013-08-13: qty 2

## 2013-08-13 MED ORDER — ONDANSETRON HCL 4 MG PO TABS: 4.0000 mg | ORAL_TABLET | Freq: Four times a day (QID) | ORAL | Status: DC | PRN

## 2013-08-13 MED ORDER — BUMETANIDE 0.5 MG PO TABS
0.5000 mg | ORAL_TABLET | ORAL | Status: DC
  Administered 2013-08-14: 0.5 mg via ORAL
  Filled 2013-08-13: qty 1

## 2013-08-13 MED ORDER — METOCLOPRAMIDE HCL 5 MG/ML IJ SOLN
5.0000 mg | Freq: Three times a day (TID) | INTRAMUSCULAR | Status: DC | PRN
  Filled 2013-08-13: qty 2

## 2013-08-13 MED ORDER — MAGNESIUM CITRATE PO SOLN: 1.0000 | Freq: Once | ORAL | Status: AC | PRN

## 2013-08-13 MED ORDER — FENTANYL CITRATE 0.05 MG/ML IJ SOLN
INTRAMUSCULAR | Status: AC
  Filled 2013-08-13: qty 2

## 2013-08-13 MED ORDER — TIOTROPIUM BROMIDE MONOHYDRATE 18 MCG IN CAPS
18.0000 ug | ORAL_CAPSULE | Freq: Every day | RESPIRATORY_TRACT | Status: DC
  Administered 2013-08-14 – 2013-08-15 (×2): 18 ug via RESPIRATORY_TRACT
  Filled 2013-08-13: qty 5

## 2013-08-13 MED ORDER — TIOTROPIUM BROMIDE MONOHYDRATE 18 MCG IN CAPS
18.0000 ug | ORAL_CAPSULE | Freq: Every day | RESPIRATORY_TRACT | Status: DC
Start: 2013-08-13 — End: 2013-08-13

## 2013-08-13 MED ORDER — DEXAMETHASONE SODIUM PHOSPHATE 10 MG/ML IJ SOLN
10.0000 mg | Freq: Once | INTRAMUSCULAR | Status: AC
  Administered 2013-08-13: 10 mg via INTRAVENOUS

## 2013-08-13 MED ORDER — FERROUS SULFATE 325 (65 FE) MG PO TABS
325.0000 mg | ORAL_TABLET | Freq: Three times a day (TID) | ORAL | Status: DC
  Administered 2013-08-13 – 2013-08-15 (×4): 325 mg via ORAL
  Filled 2013-08-13 (×8): qty 1

## 2013-08-13 MED ORDER — ONDANSETRON HCL 4 MG/2ML IJ SOLN: 4.0000 mg | Freq: Four times a day (QID) | INTRAMUSCULAR | Status: DC | PRN

## 2013-08-13 MED ORDER — MIDAZOLAM HCL 5 MG/5ML IJ SOLN
INTRAMUSCULAR | Status: DC | PRN
  Administered 2013-08-13: 1.5 mg via INTRAVENOUS

## 2013-08-13 MED ORDER — LORATADINE 10 MG PO TABS
10.0000 mg | ORAL_TABLET | Freq: Every day | ORAL | Status: DC
  Administered 2013-08-13 – 2013-08-15 (×3): 10 mg via ORAL
  Filled 2013-08-13 (×3): qty 1

## 2013-08-13 MED ORDER — MIDAZOLAM HCL 2 MG/2ML IJ SOLN
INTRAMUSCULAR | Status: AC
  Filled 2013-08-13: qty 2

## 2013-08-13 MED ORDER — PROPOFOL 10 MG/ML IV BOLUS
INTRAVENOUS | Status: AC
  Filled 2013-08-13: qty 20

## 2013-08-13 MED ORDER — BUMETANIDE 1 MG PO TABS
1.0000 mg | ORAL_TABLET | ORAL | Status: DC
  Filled 2013-08-13: qty 1

## 2013-08-13 MED ORDER — FUROSEMIDE 10 MG/ML IJ SOLN
20.0000 mg | Freq: Once | INTRAMUSCULAR | Status: AC
  Administered 2013-08-13: 20 mg via INTRAVENOUS
  Filled 2013-08-13: qty 2

## 2013-08-13 MED ORDER — BUPIVACAINE HCL (PF) 0.5 % IJ SOLN
INTRAMUSCULAR | Status: DC | PRN
  Administered 2013-08-13: 3 mL

## 2013-08-13 MED ORDER — CISATRACURIUM BESYLATE 20 MG/10ML IV SOLN
INTRAVENOUS | Status: AC
  Filled 2013-08-13: qty 10

## 2013-08-13 MED ORDER — GABAPENTIN 400 MG PO CAPS
800.0000 mg | ORAL_CAPSULE | Freq: Three times a day (TID) | ORAL | Status: DC
  Administered 2013-08-13 – 2013-08-15 (×6): 800 mg via ORAL
  Filled 2013-08-13 (×8): qty 2

## 2013-08-13 MED ORDER — PROPOFOL 10 MG/ML IV BOLUS
INTRAVENOUS | Status: AC
Start: 2013-08-13 — End: 2013-08-13
  Filled 2013-08-13: qty 20

## 2013-08-13 MED ORDER — ZOLPIDEM TARTRATE 5 MG PO TABS: 5.0000 mg | ORAL_TABLET | Freq: Every evening | ORAL | Status: DC | PRN

## 2013-08-13 MED ORDER — ATORVASTATIN CALCIUM 40 MG PO TABS: 40.0000 mg | ORAL_TABLET | ORAL | Status: DC

## 2013-08-13 MED ORDER — DIPHENHYDRAMINE HCL 25 MG PO CAPS
25.0000 mg | ORAL_CAPSULE | Freq: Four times a day (QID) | ORAL | Status: DC | PRN
  Administered 2013-08-14 (×2): 25 mg via ORAL
  Filled 2013-08-13 (×2): qty 1

## 2013-08-13 MED ORDER — PHENOL 1.4 % MT LIQD
1.0000 | OROMUCOSAL | Status: DC | PRN
  Filled 2013-08-13: qty 177

## 2013-08-13 MED ORDER — PANTOPRAZOLE SODIUM 40 MG PO TBEC
40.0000 mg | DELAYED_RELEASE_TABLET | Freq: Every day | ORAL | Status: DC
  Administered 2013-08-14 – 2013-08-15 (×2): 40 mg via ORAL
  Filled 2013-08-13 (×2): qty 1

## 2013-08-13 MED ORDER — RIVAROXABAN 10 MG PO TABS
10.0000 mg | ORAL_TABLET | ORAL | Status: AC
  Administered 2013-08-14 – 2013-08-15 (×2): 10 mg via ORAL
  Filled 2013-08-13 (×3): qty 1

## 2013-08-13 MED ORDER — MEPERIDINE HCL 50 MG/ML IJ SOLN: 6.2500 mg | INTRAMUSCULAR | Status: DC | PRN

## 2013-08-13 MED ORDER — BUMETANIDE 0.5 MG PO TABS
0.5000 mg | ORAL_TABLET | Freq: Every day | ORAL | Status: DC
  Administered 2013-08-14: 17:00:00 via ORAL
  Filled 2013-08-13 (×3): qty 1

## 2013-08-13 MED ORDER — METOCLOPRAMIDE HCL 10 MG PO TABS: 5.0000 mg | ORAL_TABLET | Freq: Three times a day (TID) | ORAL | Status: DC | PRN

## 2013-08-13 MED ORDER — MENTHOL 3 MG MT LOZG
1.0000 | LOZENGE | OROMUCOSAL | Status: DC | PRN
  Filled 2013-08-13: qty 9

## 2013-08-13 MED ORDER — ALBUTEROL SULFATE HFA 108 (90 BASE) MCG/ACT IN AERS: 1.0000 | INHALATION_SPRAY | Freq: Four times a day (QID) | RESPIRATORY_TRACT | Status: DC | PRN

## 2013-08-13 MED ORDER — TAMSULOSIN HCL 0.4 MG PO CAPS
0.4000 mg | ORAL_CAPSULE | Freq: Every day | ORAL | Status: DC
  Administered 2013-08-13 – 2013-08-14 (×2): 0.4 mg via ORAL
  Filled 2013-08-13 (×3): qty 1

## 2013-08-13 MED ORDER — LACTATED RINGERS IV SOLN
INTRAVENOUS | Status: DC
  Administered 2013-08-13: 1000 mL via INTRAVENOUS
  Administered 2013-08-13 (×3): via INTRAVENOUS

## 2013-08-13 MED ORDER — PROPOFOL 10 MG/ML IV BOLUS
INTRAVENOUS | Status: DC | PRN
  Administered 2013-08-13: 30 mg via INTRAVENOUS

## 2013-08-13 MED ORDER — CEFAZOLIN SODIUM-DEXTROSE 2-3 GM-% IV SOLR
2.0000 g | INTRAVENOUS | Status: AC
  Administered 2013-08-13: 2 g via INTRAVENOUS

## 2013-08-13 MED ORDER — BISACODYL 10 MG RE SUPP: 10.0000 mg | Freq: Every day | RECTAL | Status: DC | PRN

## 2013-08-13 MED ORDER — ALUM & MAG HYDROXIDE-SIMETH 200-200-20 MG/5ML PO SUSP: 30.0000 mL | ORAL | Status: DC | PRN

## 2013-08-13 MED ORDER — FENTANYL CITRATE 0.05 MG/ML IJ SOLN
INTRAMUSCULAR | Status: DC | PRN
  Administered 2013-08-13: 50 ug via INTRAVENOUS

## 2013-08-13 MED ORDER — DEXAMETHASONE SODIUM PHOSPHATE 10 MG/ML IJ SOLN
10.0000 mg | Freq: Once | INTRAMUSCULAR | Status: AC
  Administered 2013-08-14: 10 mg via INTRAVENOUS
  Filled 2013-08-13: qty 1

## 2013-08-13 MED ORDER — CEFAZOLIN SODIUM-DEXTROSE 2-3 GM-% IV SOLR
INTRAVENOUS | Status: AC
  Filled 2013-08-13: qty 50

## 2013-08-13 MED ORDER — DEXAMETHASONE SODIUM PHOSPHATE 10 MG/ML IJ SOLN
INTRAMUSCULAR | Status: AC
  Filled 2013-08-13: qty 1

## 2013-08-13 SURGICAL SUPPLY — 43 items
ADH SKN CLS APL DERMABOND .7 (GAUZE/BANDAGES/DRESSINGS) ×1
BAG SPEC THK2 15X12 ZIP CLS (MISCELLANEOUS)
BAG ZIPLOCK 12X15 (MISCELLANEOUS) IMPLANT
BLADE SAW SGTL 18X1.27X75 (BLADE) ×2 IMPLANT
CAPT HIP PF MOP ×1 IMPLANT
DERMABOND ADVANCED (GAUZE/BANDAGES/DRESSINGS) ×1
DERMABOND ADVANCED .7 DNX12 (GAUZE/BANDAGES/DRESSINGS) ×1 IMPLANT
DRAPE C-ARM 42X120 X-RAY (DRAPES) ×2 IMPLANT
DRAPE STERI IOBAN 125X83 (DRAPES) ×2 IMPLANT
DRAPE U-SHAPE 47X51 STRL (DRAPES) ×6 IMPLANT
DRSG AQUACEL AG ADV 3.5X10 (GAUZE/BANDAGES/DRESSINGS) ×2 IMPLANT
DRSG TEGADERM 4X4.75 (GAUZE/BANDAGES/DRESSINGS) IMPLANT
DURAPREP 26ML APPLICATOR (WOUND CARE) ×2 IMPLANT
ELECT BLADE TIP CTD 4 INCH (ELECTRODE) ×2 IMPLANT
ELECT REM PT RETURN 9FT ADLT (ELECTROSURGICAL) ×2
ELECTRODE REM PT RTRN 9FT ADLT (ELECTROSURGICAL) ×1 IMPLANT
EVACUATOR 1/8 PVC DRAIN (DRAIN) IMPLANT
FACESHIELD WRAPAROUND (MASK) ×8 IMPLANT
FACESHIELD WRAPAROUND OR TEAM (MASK) ×4 IMPLANT
GAUZE SPONGE 2X2 8PLY STRL LF (GAUZE/BANDAGES/DRESSINGS) IMPLANT
GLOVE BIOGEL PI IND STRL 7.5 (GLOVE) ×1 IMPLANT
GLOVE BIOGEL PI IND STRL 8 (GLOVE) ×1 IMPLANT
GLOVE BIOGEL PI INDICATOR 7.5 (GLOVE) ×1
GLOVE BIOGEL PI INDICATOR 8 (GLOVE) ×1
GLOVE ECLIPSE 8.0 STRL XLNG CF (GLOVE) ×2 IMPLANT
GLOVE ORTHO TXT STRL SZ7.5 (GLOVE) ×4 IMPLANT
GOWN SPEC L3 XXLG W/TWL (GOWN DISPOSABLE) ×2 IMPLANT
GOWN STRL REUS W/TWL LRG LVL3 (GOWN DISPOSABLE) ×2 IMPLANT
GRAFT DBM PARTICULATE CANC 15 (Bone Implant) IMPLANT
GRAFT IC CHAMBER LG (Bone Implant) ×2 IMPLANT
KIT BASIN OR (CUSTOM PROCEDURE TRAY) ×2 IMPLANT
PACK TOTAL JOINT (CUSTOM PROCEDURE TRAY) ×2 IMPLANT
PADDING CAST COTTON 6X4 STRL (CAST SUPPLIES) ×2 IMPLANT
SPONGE GAUZE 2X2 STER 10/PKG (GAUZE/BANDAGES/DRESSINGS)
SUT MNCRL AB 4-0 PS2 18 (SUTURE) ×2 IMPLANT
SUT VIC AB 1 CT1 36 (SUTURE) ×6 IMPLANT
SUT VIC AB 2-0 CT1 27 (SUTURE) ×4
SUT VIC AB 2-0 CT1 TAPERPNT 27 (SUTURE) ×2 IMPLANT
SUT VLOC 180 0 24IN GS25 (SUTURE) ×2 IMPLANT
TOWEL OR 17X26 10 PK STRL BLUE (TOWEL DISPOSABLE) ×2 IMPLANT
TOWEL OR NON WOVEN STRL DISP B (DISPOSABLE) IMPLANT
TRAY FOLEY CATH 16FRSI W/METER (SET/KITS/TRAYS/PACK) ×1 IMPLANT
WATER STERILE IRR 1500ML POUR (IV SOLUTION) ×1 IMPLANT

## 2013-08-13 NOTE — Anesthesia Procedure Notes (Signed)
Spinal  Patient location during procedure: OR Start time: 08/13/2013 12:28 PM End time: 08/13/2013 12:40 PM Staffing Anesthesiologist: Nolon Nations R Performed by: anesthesiologist  Preanesthetic Checklist Completed: patient identified, site marked, surgical consent, pre-op evaluation, timeout performed, IV checked, risks and benefits discussed and monitors and equipment checked Spinal Block Patient position: sitting Prep: Betadine Patient monitoring: heart rate, continuous pulse ox and blood pressure Approach: right paramedian Location: L2-3 Injection technique: single-shot Needle Needle type: Quincke  Needle gauge: 22 G Needle length: 9 cm Assessment Sensory level: T8 Additional Notes Expiration date of kit checked and confirmed. Patient tolerated procedure well, without complications.

## 2013-08-13 NOTE — Interval H&P Note (Signed)
History and Physical Interval Note:  08/13/2013 10:28 AM  [name]  has presented today for surgery, with the diagnosis of LEFT HIP OA  The various methods of treatment have been discussed with the patient and family. After consideration of risks, benefits and other options for treatment, the patient has consented to  Procedure(s): LEFT TOTAL HIP ARTHROPLASTY ANTERIOR APPROACH (Left) as a surgical intervention .  The patient's history has been reviewed, patient examined, no change in status, stable for surgery.  I have reviewed the patient's chart and labs.  Questions were answered to the patient's satisfaction.     [name]

## 2013-08-13 NOTE — Anesthesia Postprocedure Evaluation (Signed)
Anesthesia Post Note  Patient: [name]  Procedure(s) Performed: Procedure(s) (LRB): LEFT TOTAL HIP ARTHROPLASTY ANTERIOR APPROACH (Left)  Anesthesia type: Spinal  Patient location: PACU  Post pain: Pain level controlled  Post assessment: Post-op Vital signs reviewed  Last Vitals: BP 127/51  Pulse 67  Temp(Src) 36.2 C (Oral)  Resp 16  Ht 5\' 8"  (1.727 m)  Wt 205 lb (92.987 kg)  BMI 31.18 kg/m2  SpO2 96%  Post vital signs: Reviewed  Level of consciousness: sedated  Complications: No apparent anesthesia complications

## 2013-08-13 NOTE — Op Note (Signed)
NAME:  [name]                ACCOUNT NO.: [id number]      MEDICAL RECORD NO.: [id number]      FACILITY:  [facility]      PHYSICIAN:  Durene Romans D  DATE OF BIRTH:  1927/03/03     DATE OF PROCEDURE:  08/13/2013                                 OPERATIVE REPORT         PREOPERATIVE DIAGNOSIS: Left  hip osteoarthritis.      POSTOPERATIVE DIAGNOSIS:  Left hip osteoarthritis.      PROCEDURE:  Left total hip replacement through an anterior approach   utilizing DePuy THR system, component size 56mm pinnacle cup, a size 36+4 neutral   Altrex liner, a size 8 Hi Tri Lock stem with a 36+1.5 Aticuleze metal head ball.      SURGEON:  Madlyn Frankel. Charlann Boxer, M.D.      ASSISTANT:  Lanney Gins, [name]      ANESTHESIA:  Spinal.      SPECIMENS:  None.      COMPLICATIONS:  None.      BLOOD LOSS:  750 cc     DRAINS:  None      INDICATION OF THE PROCEDURE:  [name] is a 80 y.o. male who had   presented to office for evaluation of left hip pain.  Radiographs revealed   severe degenerative changes with bone-on-bone   articulation to the  hip joint and significant deformity to the femoral head and neck as a result.  The patient had painful limited range of   motion significantly affecting their overall quality of life.  The patient was failing to    respond to conservative measures, and at this point was ready   to proceed with more definitive measures.  The patient has noted progressive   degenerative changes in his hip, progressive problems and dysfunction   with regarding the hip prior to surgery.  Consent was obtained for   benefit of pain relief.  Specific risk of infection, DVT, component   failure, dislocation, need for revision surgery, as well discussion of   the anterior versus posterior approach were reviewed.  Consent was   obtained for benefit of anterior pain relief through an anterior   approach.      PROCEDURE IN DETAIL:  The patient was brought  to operative theater.   Once adequate anesthesia, preoperative antibiotics, 2gm Ancef administered.   The patient was positioned supine on the OSI Hanna table.  Once adequate   padding of boney process was carried out, we had predraped out the hip, and  used fluoroscopy to confirm orientation of the pelvis and position.      The left hip was then prepped and draped from proximal iliac crest to   mid thigh with shower curtain technique.      Time-out was performed identifying the patient, planned procedure, and   extremity.     An incision was then made 2 cm distal and lateral to the   anterior superior iliac spine extending over the orientation of the   tensor fascia lata muscle and sharp dissection was carried down to the   fascia of the muscle and protractor placed in the soft tissues.      The fascia  was then incised.  The muscle belly was identified and swept   laterally and retractor placed along the superior neck.  Following   cauterization of the circumflex vessels and removing some pericapsular   fat, a second cobra retractor was placed on the inferior neck.  A third   retractor was placed on the anterior acetabulum after elevating the   anterior rectus.  A L-capsulotomy was along the line of the   superior neck to the trochanteric fossa, then extended proximally and   distally.  Tag sutures were placed and the retractors were then placed   intracapsular.  We then identified the trochanteric fossa and   orientation of my neck cut, confirmed this radiographically   and then made a neck osteotomy with the femur on traction.  The femoral   head was removed without difficulty or complication.  Traction was let   off and retractors were placed posterior and anterior around the   acetabulum.      The labrum and foveal tissue were debrided.  I began reaming with a 32mm   reamer and reamed up to 45mm reamer with good bony bed preparation and a 56   cup was chosen.  Due to  significant bone loss with in the acetabulum related to this advanced degenerative hip condition I packed 15cc of cancellous bone graft into this portion of the acetabulum.  The final 25mm Pinnacle cup was then impacted under fluoroscopy  to confirm the depth of penetration and orientation with respect to   abduction.  A screw was placed followed by the hole eliminator.  The final   36+4 neutral Altrex liner was impacted with good visualized rim fit.  The cup was positioned anatomically within the acetabular portion of the pelvis.      At this point, the femur was rolled at 80 degrees.  Further capsule was   released off the inferior aspect of the femoral neck.  I then   released the superior capsule proximally.  The hook was placed laterally   along the femur and elevated manually and held in position with the bed   hook.  The leg was then extended and adducted with the leg rolled to 100   degrees of external rotation.  Once the proximal femur was fully   exposed, I used a box osteotome to set orientation.  I then began   broaching with the starting chili pepper broach and passed this by hand and then broached up to 8.  With the 8 broach in place I chose a high offset neck and did a trial reduction.  The offset was appropriate, leg lengths appeared to be equal, confirmed radiographically.   Given these findings, I went ahead and dislocated the hip, repositioned all   retractors and positioned the right hip in the extended and abducted position.  The final 8 Hi Tri Lock stem was   chosen and it was impacted down to the level of neck cut.  Based on this   and the trial reduction, a 36+1.5 Articuleze metal ball was chosen and   impacted onto a clean and dry trunnion, and the hip was reduced.  The   hip had been irrigated throughout the case again at this point.  I did   reapproximate the superior capsular leaflet to the anterior leaflet   using #1 Vicryl.  The fascia of the   tensor fascia lata  muscle was then reapproximated using #1 Vicryl and #0 V-lock sutures.  The   remaining wound was closed with 2-0 Vicryl and running 4-0 Monocryl.   The hip was cleaned, dried, and dressed sterilely using Dermabond and   Aquacel dressing.  She was then brought   to recovery room in stable condition tolerating the procedure well.    Lanney GinsMatthew Babish, [name] was present for the entirety of the case involved from   preoperative positioning, perioperative retractor management, general   facilitation of the case, as well as primary wound closure as assistant.            Madlyn FrankelMatthew D. Charlann Boxerlin, M.D.        08/13/2013 5:17 PM

## 2013-08-13 NOTE — Progress Notes (Signed)
Utilization review completed.  

## 2013-08-13 NOTE — Anesthesia Preprocedure Evaluation (Addendum)
Anesthesia Evaluation  Patient identified by MRN, date of birth, ID band Patient awake    Reviewed: Allergy & Precautions, H&P , NPO status , Patient's Chart, lab work & pertinent test results, reviewed documented beta blocker date and time   History of Anesthesia Complications Negative for: history of anesthetic complications  Airway Mallampati: II TM Distance: >3 FB Neck ROM: Full    Dental  (+) Teeth Intact, Dental Advisory Given   Pulmonary shortness of breath and with exertion, asthma , former smoker,    Pulmonary exam normal       Cardiovascular hypertension, Pt. on medications +CHF + dysrhythmias Atrial Fibrillation     Neuro/Psych negative neurological ROS  negative psych ROS   GI/Hepatic GERD-  Medicated,  Endo/Other  diabetes  Renal/GU Renal InsufficiencyRenal disease     Musculoskeletal   Abdominal   Peds  Hematology   Anesthesia Other Findings EF 40%  Reproductive/Obstetrics                         Anesthesia Physical  Anesthesia Plan  ASA: III  Anesthesia Plan: Spinal   Post-op Pain Management:    Induction:   Airway Management Planned:   Additional Equipment:   Intra-op Plan:   Post-operative Plan:   Informed Consent: I have reviewed the patients History and Physical, chart, labs and discussed the procedure including the risks, benefits and alternatives for the proposed anesthesia with the patient or authorized representative who has indicated his/her understanding and acceptance.   Dental advisory given  Plan Discussed with: CRNA  Anesthesia Plan Comments: (Coags wnl and pt off xarelto for 3 days)       Anesthesia Quick Evaluation

## 2013-08-13 NOTE — Transfer of Care (Signed)
Immediate Anesthesia Transfer of Care Note  Patient: [name]  Procedure(s) Performed: Procedure(s): LEFT TOTAL HIP ARTHROPLASTY ANTERIOR APPROACH (Left)  Patient Location: PACU  Anesthesia Type:Regional and Spinal  Level of Consciousness: awake, sedated and patient cooperative  Airway & Oxygen Therapy: Patient Spontanous Breathing and Patient connected to face mask oxygen  Post-op Assessment: Report given to PACU RN and Post -op Vital signs reviewed and stable  Post vital signs: Reviewed and stable  Complications: No apparent anesthesia complications

## 2013-08-14 ENCOUNTER — Encounter (HOSPITAL_COMMUNITY): Payer: Self-pay | Admitting: Orthopedic Surgery

## 2013-08-14 LAB — BASIC METABOLIC PANEL
BUN: 15 mg/dL (ref 6–23)
CO2: 30 mEq/L (ref 19–32)
CREATININE: 0.86 mg/dL (ref 0.50–1.35)
Calcium: 8.2 mg/dL — ABNORMAL LOW (ref 8.4–10.5)
Chloride: 102 mEq/L (ref 96–112)
GFR, EST AFRICAN AMERICAN: 90 mL/min — AB (ref >90)
GFR, EST NON AFRICAN AMERICAN: 77 mL/min — AB (ref >90)
Glucose, Bld: 109 mg/dL — ABNORMAL HIGH (ref 70–99)
Potassium: 3.7 mEq/L (ref 3.7–5.3)
Sodium: 140 mEq/L (ref 137–147)

## 2013-08-14 LAB — CBC
HEMATOCRIT: 26.3 % — AB (ref 39.0–52.0)
Hemoglobin: 8.5 g/dL — ABNORMAL LOW (ref 13.0–17.0)
MCH: 30.4 pg (ref 26.0–34.0)
MCHC: 32.3 g/dL (ref 30.0–36.0)
MCV: 93.9 fL (ref 78.0–100.0)
Platelets: 280 10*3/uL (ref 150–400)
RBC: 2.8 MIL/uL — ABNORMAL LOW (ref 4.22–5.81)
RDW: 14.3 % (ref 11.5–15.5)
WBC: 10 10*3/uL (ref 4.0–10.5)

## 2013-08-14 NOTE — Progress Notes (Signed)
Occupational Therapy Evaluation Patient Details Name: [name] MRN: [id number] DOB: 10-10-1926 Today's Date: 08/14/2013    History of Present Illness s/p L direct anterior THA   Clinical Impression   PTA, pt lived at home with son, who assisted with IADL tasks. Pt was mod I with BADL and mobility @ RW level. Pt will benefit from rehab at SNF to return to PLOF. Pt will benefit from skilled OT services to facilitate D/C to next venue due to below deficits.    Follow Up Recommendations  SNF;Supervision/Assistance - 24 hour    Equipment Recommendations  Other (comment) (TBD at SNF)    Recommendations for Other Services       Precautions / Restrictions Precautions Precautions: Fall Restrictions Weight Bearing Restrictions: No      Mobility Bed Mobility Overal bed mobility: Needs Assistance Bed Mobility: Supine to Sit     Supine to sit: Min assist;Mod assist     General bed mobility comments: cues for technique, incr time  Transfers Overall transfer level: Needs assistance Equipment used: Rolling walker (2 wheeled) Transfers: Sit to/from Stand Sit to Stand: Mod assist         General transfer comment: better performance than this am    Balance Overall balance assessment: Needs assistance   Sitting balance-Leahy Scale: Good       Standing balance-Leahy Scale: Poor                      ADL   Grooming: Set up;Sitting   Upper Body Dressing : Set up;Sitting Lower Body Bathing: Maximal assistance;Sit to/from stand Lower Body Dressing: Maximal assistance;Sit to/from stand Toilet Transfer: Ambulation;Moderate assistance Toileting- Clothing Manipulation and Hygiene: Minimal assistance;Sitting/lateral lean;Sit to/from stand   Functional mobility during ADLs: Moderate assistance;+2 for physical assistance;Rolling walker General ADL Comments: motivated to be more independent     ImmunologistVision                     Perception     Praxis       Pertinent Vitals/Pain L hip pain with movement @ 6/10     Hand Dominance     Extremity/Trunk Assessment Upper Extremity Assessment Upper Extremity Assessment: Generalized weakness   Lower Extremity Assessment Lower Extremity Assessment: Defer to PT evaluation LLE Deficits / Details: ankle grossly WFL; knee and hip AAROM painful; pt with LLE positioned in external rotation LLE: Unable to fully assess due to pain   Cervical / Trunk Assessment Cervical / Trunk Assessment: Kyphotic   Communication Communication Communication: 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   Cognition Arousal/Alertness: Awake/alert Behavior During Therapy: WFL for tasks assessed/performed Overall Cognitive Status: Within Functional Limits for tasks assessed                     General Comments       Exercises Exercises: General Lower Extremity (for ADL)    Home Living Family/patient expects to be discharged to:: Skilled nursing facility                                        Prior Functioning/Environment Level of Independence: Independent with assistive device(s)        Comments: uses  4 wheeled RW    OT Diagnosis:     OT Problem List: Decreased strength;Decreased range of motion;Decreased activity tolerance;Impaired balance (sitting and/or standing);Decreased knowledge of use of DME  or AE;Pain;Obesity   OT Treatment/Interventions: Self-care/ADL training;Therapeutic exercise;Energy conservation;DME and/or AE instruction;Therapeutic activities;Patient/family education;Balance training    OT Goals(Current goals can be found in the care plan section) Acute Rehab OT Goals Patient Stated Goal: to be independent OT Goal Formulation: With patient Time For Goal Achievement: 08/28/13 Potential to Achieve Goals: Good  OT Frequency: Min 2X/week   Barriers to D/C:            End of Session: Equipment Utilized During Treatment: Gait belt;Rolling walker  Activity Tolerance: Patient tolerated  treatment well Patient left: in chair;with call bell/phone within reach;with nursing/sitter in room   Time: 1030-1119 OT Time Calculation (min): 49 min Charges:  OT General Charges $OT Visit: 1 Procedure OT Evaluation $Initial OT Evaluation Tier I: 1 Procedure OT Treatments $Self Care/Home Management : 23-37 mins $Therapeutic Activity: 8-22 mins G-Codes:    [name],[name] 09-08-2013, 11:21 AM   [name], OTR/L  [phone] 2013-09-08

## 2013-08-14 NOTE — Progress Notes (Signed)
Clinical Social Work Department BRIEF PSYCHOSOCIAL ASSESSMENT 08/14/2013  Patient:  BRALEN, WILTGEN     Account Number:  [id number]     Admit date:  08/13/2013  Clinical Social Worker:  Lacie Scotts  Date/Time:  08/14/2013 11:23 AM  Referred by:  Physician  Date Referred:  08/14/2013 Referred for  SNF Placement   Other Referral:   Interview type:  Patient Other interview type:    PSYCHOSOCIAL DATA Living Status:  FAMILY Admitted from facility:   Level of care:   Primary support name:  [facility] Primary support relationship to patient:  CHILD, ADULT Degree of support available:   supportive    CURRENT CONCERNS Current Concerns  Post-Acute Placement   Other Concerns:    SOCIAL WORK ASSESSMENT / PLAN Pt is an 80 yr old gentleman living at home prior to hospitalization. CSW met with pt to assist with d/c planning. Pt states he has made prior arrangements to have ST Rehab at [facility] following hospital d/c. CSW has contacted SNF and is awaiting return call to confirm d/c plan. Pt has [employer]. CSW will request authorization for SNF placement and ambulance transport.   Assessment/plan status:  Psychosocial Support/Ongoing Assessment of Needs Other assessment/ plan:   Information/referral to community resources:   Insurance coverage for SNF and ambulance transport reviewed.    PATIENT'S/FAMILY'S RESPONSE TO PLAN OF CARE: Pt says his pain is controlled. He is motivated to work with therapy and is looking forward to having rehab at [facility].   Werner Lean LCSW [phone]

## 2013-08-14 NOTE — Progress Notes (Signed)
   Subjective: 1 Day Post-Op Procedure(s) (LRB): LEFT TOTAL HIP ARTHROPLASTY ANTERIOR APPROACH (Left)   Patient reports pain as mild, pain controlled. No events throughout the night. Ready to try working with PT.  Objective:   VITALS:   Filed Vitals:   08/14/13 0458  BP: 115/61  Pulse: 72  Temp: 99.6 F (37.6 C)  Resp: 16    Neurovascular intact Dorsiflexion/Plantar flexion intact Incision: dressing C/D/I No cellulitis present Compartment soft  LABS  Recent Labs  08/14/13 0425  HGB 8.5*  HCT 26.3*  WBC 10.0  PLT 280     Recent Labs  08/14/13 0425  NA 140  K 3.7  BUN 15  CREATININE 0.86  GLUCOSE 109*     Assessment/Plan: 1 Day Post-Op Procedure(s) (LRB): LEFT TOTAL HIP ARTHROPLASTY ANTERIOR APPROACH (Left) Foley cath d/c'ed Advance diet Up with therapy D/C IV fluids Discharge to SNF eventually, when ready  Expected ABLA  Treated with iron and will observe  Obese (BMI 30-39.9) Estimated body mass index is 31.18 kg/(m^2) as calculated from the following:   Height as of this encounter: 5\' 8"  (1.727 m).   Weight as of this encounter: 92.987 kg (205 lb). Patient also counseled that weight may inhibit the healing process Patient counseled that losing weight will help with future health issues      Anastasio Auerbach. Kalyn Dimattia   PAC  08/14/2013, 8:00 AM

## 2013-08-14 NOTE — Progress Notes (Signed)
08/14/13 1300  PT Visit Information  Last PT Received On 08/14/13  Assistance Needed +2 (safety)  History of Present Illness s/p L direct anterior THA  PT Time Calculation  PT Start Time 1347  PT Stop Time 1404  PT Time Calculation (min) 17 min  Subjective Data  Patient Stated Goal to be independent  Precautions  Precautions Fall  Cognition  Arousal/Alertness Awake/alert  Behavior During Therapy WFL for tasks assessed/performed  Overall Cognitive Status Within Functional Limits for tasks assessed  Bed Mobility  Overal bed mobility Needs Assistance  Bed Mobility Sit to Supine  Sit to supine +2 for physical assistance;Mod assist  General bed mobility comments cues for technique, incr time  Transfers  Overall transfer level Needs assistance  Equipment used Rolling walker (2 wheeled)  Transfers Sit to/from Stand  Sit to Stand Min assist;+2 safety/equipment;Mod assist  General transfer comment cues for hand placement safe technique  Ambulation/Gait  Ambulation/Gait assistance +2 physical assistance;Min assist  Ambulation Distance (Feet) 11 Feet  Assistive device Rolling walker (2 wheeled)  Gait Pattern/deviations Step-to pattern;Wide base of support;Shuffle  Gait velocity decr  General Gait Details cues for  sequence, use of UB, safe RW technique  Balance  Sitting balance-Leahy Scale Good  Standing balance-Leahy Scale Poor  Total Joint Exercises  Ankle Circles/Pumps AROM;15 reps;Both  Quad Sets AROM;Both;15 reps  Heel Slides AAROM;Left;10 reps  PT - End of Session  Equipment Utilized During Treatment Gait belt  Activity Tolerance Patient tolerated treatment well  Patient left in bed;with call bell/phone within reach;with family/visitor present  Nurse Communication Mobility status  PT - Assessment/Plan  PT Plan Current plan remains appropriate  PT Frequency 7X/week  Follow Up Recommendations SNF  PT equipment None recommended by PT  PT Goal Progression  Progress  towards PT goals Progressing toward goals  Acute Rehab PT Goals  Time For Goal Achievement 08/14/13  Potential to Achieve Goals Good  PT General Charges  $$ ACUTE PT VISIT 1 Procedure  PT Treatments  $Gait Training 8-22 mins

## 2013-08-14 NOTE — Evaluation (Signed)
Physical Therapy Evaluation Patient Details Name: [name] MRN: [id number] DOB: 07-23-1926 Today's Date: 08/14/2013   History of Present Illness  s/p L DA THA  Clinical Impression  Pt will benefit from PT to address deficits below; plan is for SNF    Follow Up Recommendations SNF    Equipment Recommendations       Recommendations for Other Services       Precautions / Restrictions Precautions Precautions: Fall Restrictions Weight Bearing Restrictions: No      Mobility  Bed Mobility Overal bed mobility: Needs Assistance Bed Mobility: Supine to Sit     Supine to sit: Min assist;Mod assist     General bed mobility comments: cues for technique, incr time  Transfers Overall transfer level: Needs assistance Equipment used: Rolling walker (2 wheeled) Transfers: Sit to/from Stand           General transfer comment: cues for hand  placement and safe technique  Ambulation/Gait Ambulation/Gait assistance: Min assist Ambulation Distance (Feet): 5 Feet Assistive device: Rolling walker (2 wheeled) Gait Pattern/deviations: Step-to pattern;Antalgic;Decreased step length - right;Decreased step length - left Gait velocity: decr   General Gait Details: cues for  sequence, use of UB, safe RW technique  Stairs            Wheelchair Mobility    Modified Rankin (Stroke Patients Only)       Balance Overall balance assessment: Needs assistance   Sitting balance-Leahy Scale: Fair       Standing balance-Leahy Scale: Poor                       Pertinent Vitals/Pain VSS;  Pain controlled    Home Living Family/patient expects to be discharged to:: Skilled nursing facility                      Prior Function Level of Independence: Independent with assistive device(s)         Comments: uses  4 wheeled RW     Hand Dominance        Extremity/Trunk Assessment               Lower Extremity Assessment: LLE  deficits/detail   LLE Deficits / Details: ankle grossly WFL; knee and hip AAROM painful; pt with LLE positioned in external rotation     Communication      Cognition Arousal/Alertness: Awake/alert Behavior During Therapy: WFL for tasks assessed/performed Overall Cognitive Status: Within Functional Limits for tasks assessed                      General Comments      Exercises        Assessment/Plan    PT Assessment Patient needs continued PT services  PT Diagnosis Difficulty walking   PT Problem List Decreased strength;Decreased activity tolerance;Decreased balance;Decreased mobility;Decreased knowledge of use of DME;Decreased range of motion  PT Treatment Interventions DME instruction;Gait training;Functional mobility training;Therapeutic activities;Therapeutic exercise;Patient/family education;Balance training   PT Goals (Current goals can be found in the Care Plan section) Acute Rehab PT Goals Patient Stated Goal: decr pain and walk better PT Goal Formulation: With patient Time For Goal Achievement: 08/14/13 Potential to Achieve Goals: Good    Frequency 7X/week   Barriers to discharge        End of Session Equipment Utilized During Treatment: Gait belt Activity Tolerance: Patient tolerated treatment well Patient left: with call bell/phone within reach;in chair;with nursing/sitter in room  Time: 3762-8315 PT Time Calculation (min): 27 min   Charges:   PT Evaluation $Initial PT Evaluation Tier I: 1 Procedure PT Treatments $Therapeutic Activity: 23-37 mins   PT G Codes:          [name] 09-13-2013, 9:29 AM

## 2013-08-14 NOTE — Progress Notes (Signed)
Clinical Social Work Department CLINICAL SOCIAL WORK PLACEMENT NOTE 08/14/2013  Patient:  [name], [name]  Account Number:  [id number] Admit date:  08/13/2013  Clinical Social Worker:  Cori Razor, LCSW  Date/time:  08/14/2013 11:39 AM  Clinical Social Work is seeking post-discharge placement for this patient at the following level of care:   SKILLED NURSING   (*CSW will update this form in Epic as items are completed)     Patient/family provided with [facility] Department of Clinical Social Work's list of facilities offering this level of care within the geographic area requested by the patient (or if unable, by the patient's family).  08/14/2013  Patient/family informed of their freedom to choose among providers that offer the needed level of care, that participate in Medicare, Medicaid or managed care program needed by the patient, have an available bed and are willing to accept the patient.    Patient/family informed of [facility]' ownership interest in [facility], as well as of the fact that they are under no obligation to receive care at this facility.  PASARR submitted to EDS on 08/13/2013 PASARR number received from EDS on 08/13/2013  FL2 transmitted to all facilities in geographic area requested by pt/family on  08/14/2013 FL2 transmitted to all facilities within larger geographic area on   Patient informed that his/her managed care company has contracts with or will negotiate with  certain facilities, including the following:     Patient/family informed of bed offers received:   Patient chooses bed at  Physician recommends and patient chooses bed at    Patient to be transferred to  on   Patient to be transferred to facility by   The following physician request were entered in Epic:   Additional Comments:  Cori Razor LCSW [phone]

## 2013-08-14 NOTE — Progress Notes (Signed)
OT Cancellation Note  Patient Details Name: [name] MRN: [id number] DOB: 1927-03-10   Cancelled Treatment:    Reason Eval/Treat Not Completed: Other (comment) Pt to D/C to SNF. Plan to defer OT to SNF. If eval needed for admission, please call [phone] or text page OT at [phone]. Thank you. [facility] Gabreille Dardis, OTR/L  [phone] 08/14/2013 08/14/2013, 9:32 AM

## 2013-08-15 LAB — BASIC METABOLIC PANEL
BUN: 15 mg/dL (ref 6–23)
CALCIUM: 8.7 mg/dL (ref 8.4–10.5)
CO2: 28 meq/L (ref 19–32)
CREATININE: 0.84 mg/dL (ref 0.50–1.35)
Chloride: 103 mEq/L (ref 96–112)
GFR calc Af Amer: >90 mL/min (ref >90)
GFR, EST NON AFRICAN AMERICAN: 78 mL/min — AB (ref >90)
GLUCOSE: 123 mg/dL — AB (ref 70–99)
Potassium: 3.4 mEq/L — ABNORMAL LOW (ref 3.7–5.3)
Sodium: 141 mEq/L (ref 137–147)

## 2013-08-15 LAB — CBC
HEMATOCRIT: 25.4 % — AB (ref 39.0–52.0)
HEMOGLOBIN: 8.2 g/dL — AB (ref 13.0–17.0)
MCH: 30.3 pg (ref 26.0–34.0)
MCHC: 32.3 g/dL (ref 30.0–36.0)
MCV: 93.7 fL (ref 78.0–100.0)
Platelets: 264 10*3/uL (ref 150–400)
RBC: 2.71 MIL/uL — ABNORMAL LOW (ref 4.22–5.81)
RDW: 14.7 % (ref 11.5–15.5)
WBC: 11.7 10*3/uL — ABNORMAL HIGH (ref 4.0–10.5)

## 2013-08-15 MED ORDER — FERROUS SULFATE 325 (65 FE) MG PO TABS: 325.0000 mg | ORAL_TABLET | Freq: Three times a day (TID) | ORAL | Status: DC

## 2013-08-15 MED ORDER — POTASSIUM CHLORIDE CRYS ER 20 MEQ PO TBCR
40.0000 meq | EXTENDED_RELEASE_TABLET | Freq: Two times a day (BID) | ORAL | Status: DC
  Administered 2013-08-15: 40 meq via ORAL
  Filled 2013-08-15 (×2): qty 2

## 2013-08-15 MED ORDER — TIZANIDINE HCL 4 MG PO CAPS: 4.0000 mg | ORAL_CAPSULE | Freq: Three times a day (TID) | ORAL | Status: DC | PRN

## 2013-08-15 MED ORDER — DSS 100 MG PO CAPS: 100.0000 mg | ORAL_CAPSULE | Freq: Two times a day (BID) | ORAL | Status: AC

## 2013-08-15 MED ORDER — POLYETHYLENE GLYCOL 3350 17 G PO PACK: 17.0000 g | PACK | Freq: Two times a day (BID) | ORAL | Status: DC

## 2013-08-15 MED ORDER — POTASSIUM CHLORIDE 20 MEQ PO PACK: 40.0000 meq | PACK | Freq: Two times a day (BID) | ORAL | Status: DC

## 2013-08-15 MED ORDER — POTASSIUM CHLORIDE CRYS ER 20 MEQ PO TBCR: 20.0000 meq | EXTENDED_RELEASE_TABLET | Freq: Every day | ORAL | Status: DC

## 2013-08-15 MED ORDER — TRAMADOL HCL 50 MG PO TABS: 50.0000 mg | ORAL_TABLET | Freq: Four times a day (QID) | ORAL | Status: DC

## 2013-08-15 NOTE — Progress Notes (Signed)
Physical Therapy Treatment Patient Details Name: DANILO MCGREGOR MRN: [id number] DOB: May 30, 1926 Today's Date: 08/15/2013    History of Present Illness s/p L direct anterior THA    PT Comments    POD # 2 asssited pt OOb to amb to BR then back to bed.  Pt progressing slowly and plans to D/C to SNF for ST Rehab.   Follow Up Recommendations  SNF     Equipment Recommendations  None recommended by PT    Recommendations for Other Services       Precautions / Restrictions Precautions Precautions: Fall Restrictions Weight Bearing Restrictions: No    Mobility  Bed Mobility Overal bed mobility: Needs Assistance Bed Mobility: Supine to Sit;Sit to Supine     Supine to sit: Mod assist Sit to supine: Max assist   General bed mobility comments: increased assist back to bed  Transfers Overall transfer level: Needs assistance Equipment used: Rolling walker (2 wheeled) Transfers: Sit to/from Stand Sit to Stand: Mod assist;Max assist         General transfer comment: increased time and 25% VC safety with turns and hand placement with stand to sit  Ambulation/Gait Ambulation/Gait assistance: Mod assist;Max assist Ambulation Distance (Feet): 18 Feet (9' x 2 to and from BR) Assistive device: Rolling walker (2 wheeled) Gait Pattern/deviations: Step-to pattern Gait velocity: decr   General Gait Details: Increased time with poor forward flex posture and valgus knees.  Pt had one lateral LOB with side stepping.  HIGH FALL RISK.   Stairs            Wheelchair Mobility    Modified Rankin (Stroke Patients Only)       Balance                                    Cognition                            Exercises      General Comments        Pertinent Vitals/Pain     Home Living                      Prior Function            PT Goals (current goals can now be found in the care plan section) Progress towards PT goals:  Progressing toward goals    Frequency  7X/week    PT Plan      End of Session Equipment Utilized During Treatment: Gait belt Activity Tolerance: Patient tolerated treatment well Patient left: in bed;with call bell/phone within reach;with family/visitor present     Time: 0086-7619 PT Time Calculation (min): 32 min  Charges:  $Gait Training: 8-22 mins $Therapeutic Activity: 8-22 mins                    G Codes:      Felecia Shelling  PTA [facility]  Acute  Rehab Pager      [phone]

## 2013-08-15 NOTE — Progress Notes (Signed)
Patient ID: [name], male   DOB: 1926-09-28, 80 y.o.   MRN: [id number] Subjective: 2 Days Post-Op Procedure(s) (LRB): LEFT TOTAL HIP ARTHROPLASTY ANTERIOR APPROACH (Left)    Patient reports pain as mild.  Still has knee pains.  Pre-operative weakness persist  Objective:   VITALS:   Filed Vitals:   08/15/13 0601  BP: 119/50  Pulse: 63  Temp: 98.5 F (36.9 C)  Resp: 16    Neurovascular intact Incision: dressing C/D/I  LABS  Recent Labs  08/14/13 0425 08/15/13 0405  HGB 8.5* 8.2*  HCT 26.3* 25.4*  WBC 10.0 11.7*  PLT 280 264     Recent Labs  08/14/13 0425 08/15/13 0405  NA 140 141  K 3.7 3.4*  BUN 15 15  CREATININE 0.86 0.84  GLUCOSE 109* 123*     Recent Labs  08/13/13 1040  INR 1.13     Assessment/Plan: 2 Days Post-Op Procedure(s) (LRB): LEFT TOTAL HIP ARTHROPLASTY ANTERIOR APPROACH (Left)   Discharge to SNF Replace potassium deficit recognized this am (hypokalemia) May be able to get him to SNF today

## 2013-08-15 NOTE — Discharge Summary (Signed)
Physician Discharge Summary  Patient ID: [name] MRN: [id number] DOB/AGE: 1926-11-13 80 y.o.  Admit date: 08/13/2013 Discharge date:  08/15/2013  Procedures:  Procedure(s) (LRB): LEFT TOTAL HIP ARTHROPLASTY ANTERIOR APPROACH (Left)  Attending Physician:  Dr. Durene Romans   Admission Diagnoses:   Left hip OA / pain  Discharge Diagnoses:  Principal Problem:   S/P left THA, AA  Past Medical History  Diagnosis Date  . Obesity   . Hypertension   . Edema     Bilateral - left lower leg greater than right- wears compression stockings  . BPH (benign prostatic hypertrophy)   . Asthma   . Bronchospasm, exercise-induced   . CHF (congestive heart failure)     systolic, EF 40% (07/07/2012)  . GERD (gastroesophageal reflux disease)   . A-fib   . Shortness of breath   . Dyslipidemia   . Arthritis     knees  . Chronic kidney disease     stabilized, due to infection  . Macular degeneration 08-06-13    bilateral -sight impaired    HPI: [name], 80 y.o. male, has a history of pain and functional disability in the left hip(s) due to arthritis and patient has failed non-surgical conservative treatments for greater than 12 weeks to include NSAID's and/or analgesics, corticosteriod injections, use of assistive devices and activity modification. Onset of symptoms was gradual starting 6+ years ago with gradually worsening course since that time. He also have significant OA and pain in the left knee. The pain is 10/10 in the left knee as well. He was being worked up for knee surgery when he fell and had significant hip pain. The was preventing him from walking at all the the hip pain outThe patient noted no past surgery on the left hip(s). Patient currently rates pain in the left hip at 10 out of 10 with activity. Patient has night pain, worsening of pain with activity and weight bearing, trendelenberg gait, pain that interfers with activities of daily living and pain with passive range  of motion. Patient has evidence of periarticular osteophytes and joint space narrowing by imaging studies. This condition presents safety issues increasing the risk of falls. There is no current active infection. Risks, benefits and expectations were discussed with the patient. Risks including but not limited to the risk of anesthesia, blood clots, nerve damage, blood vessel damage, failure of the prosthesis, infection and up to and including death. Patient understand the risks, benefits and expectations and wishes to proceed with surgery.  PCP: Carrie Mew, MD   Discharged Condition: good  Hospital Course:  Patient underwent the above stated procedure on 08/13/2013. Patient tolerated the procedure well and brought to the recovery room in good condition and subsequently to the floor.  POD #1 BP: 115/61 ; Pulse: 72 ; Temp: 99.6 F (37.6 C) ; Resp: 16  Patient reports pain as mild, pain controlled. No events throughout the night. Ready to try working with PT. Neurovascular intact, dorsiflexion/plantar flexion intact, incision: dressing C/D/I, no cellulitis present and compartment soft.   LABS  Basename    HGB  8.5  HCT  26.3   POD #2  BP: 119/50 ; Pulse: 63 ; Temp: 98.5 F (36.9 C) ; Resp: 16 Patient reports pain as mild. Still has knee pains. Pre-operative weakness persist. Neurovascular intact, dorsiflexion/plantar flexion intact, incision: dressing C/D/I, no cellulitis present and compartment soft.   LABS  Basename    HGB  8.2  HCT  25.4  Discharge Exam: General appearance: alert, cooperative and no distress Extremities: Homans sign is negative, no sign of DVT, no edema, redness or tenderness in the calves or thighs and no ulcers, gangrene or trophic changes  Disposition:    Home or Self Care with follow up in 2 weeks   Follow-up Information   Follow up with Shelda PalLIN,Robyn Galati D, MD. Schedule an appointment as soon as possible for a visit in 2 weeks.   Specialty:  Orthopedic  Surgery   Contact information:   [address] [place] [address] [phone]       Discharge Orders   Future Appointments Provider Department Dept Phone   11/07/2013 9:45 AM Stacie GlazeJohn E [name], MD [facility] [phone]   Future Orders Complete By Expires   Call MD / Call 911  As directed    Comments:     If you experience chest pain or shortness of breath, CALL 911 and be transported to the hospital emergency room.  If you develope a fever above 101 F, pus (white drainage) or increased drainage or redness at the wound, or calf pain, call your surgeon's office.   Change dressing  As directed    Comments:     Maintain surgical dressing for 10-14 days, or until follow up in the clinic.   Constipation Prevention  As directed    Comments:     Drink plenty of fluids.  Prune juice may be helpful.  You may use a stool softener, such as Colace (over the counter) 100 mg twice a day.  Use MiraLax (over the counter) for constipation as needed.   Diet - low sodium heart healthy  As directed    Discharge instructions  As directed    Comments:     Maintain surgical dressing for 10-14 days, or until follow up in the clinic. [facility]. Call with any questions or concerns.   Driving restrictions  As directed    Comments:     No driving for 4 weeks   Increase activity slowly as tolerated  As directed    TED hose  As directed    Comments:     Use stockings (TED hose) for 2 weeks on both leg(s).  You may remove them at night for sleeping.   Weight bearing as tolerated  As directed    Questions:     Laterality:     Extremity:          Medication List    STOP taking these medications       ciprofloxacin 500 MG tablet  Commonly known as:  CIPRO     diclofenac sodium 1 % Gel  Commonly known as:  VOLTAREN     VOLTAREN 1 % Gel  Generic drug:  diclofenac sodium      TAKE these medications       acetaminophen  500 MG tablet  Commonly known as:  TYLENOL  Take 500 mg by mouth 3 (three) times daily. Takes with Tramadol     albuterol 108 (90 BASE) MCG/ACT inhaler  Commonly known as:  PROVENTIL HFA;VENTOLIN HFA  Inhale 1 puff into the lungs every 6 (six) hours as needed for wheezing or shortness of breath.     amiodarone 200 MG tablet  Commonly known as:  PACERONE  Take 200 mg by mouth every morning.     bumetanide 1 MG tablet  Commonly known as:  BUMEX  Take 0.5-1 mg by  mouth 2 (two) times daily. 0.5 tablet twice daily every other day. 1 tablet in the morning and 1/2 tablet in the evening every other day. (Alternates between the two)     CALCIUM 600+D 600-400 MG-UNIT per tablet  Generic drug:  Calcium Carbonate-Vitamin D  Take 1 tablet by mouth 3 (three) times a week. Monday Wednesday and Friday     cetirizine 10 MG tablet  Commonly known as:  ZYRTEC  Take 10 mg by mouth daily as needed for allergies.     DSS 100 MG Caps  Take 100 mg by mouth 2 (two) times daily.     ferrous sulfate 325 (65 FE) MG tablet  Take 1 tablet (325 mg total) by mouth 3 (three) times daily after meals.     gabapentin 800 MG tablet  Commonly known as:  NEURONTIN  Take 800 mg by mouth 3 (three) times daily.     ICAPS AREDS FORMULA PO  Take 1 tablet by mouth 2 (two) times daily.     Magnesium 250 MG Tabs  Take 250 mg by mouth daily.     magnesium hydroxide 400 MG/5ML suspension  Commonly known as:  MILK OF MAGNESIA  Take 30 mLs by mouth daily as needed for mild constipation.     multivitamin with minerals Tabs tablet  Take 1 tablet by mouth daily.     omeprazole 20 MG capsule  Commonly known as:  PRILOSEC  Take 20 mg by mouth every morning.     polyethylene glycol packet  Commonly known as:  MIRALAX / GLYCOLAX  Take 17 g by mouth 2 (two) times daily.     potassium chloride SA 20 MEQ tablet  Commonly known as:  K-DUR,KLOR-CON  Take 1 tablet (20 mEq total) by mouth daily.     Rivaroxaban 20 MG Tabs  tablet  Commonly known as:  XARELTO  Take 1 tablet (20 mg total) by mouth at bedtime.     rosuvastatin 20 MG tablet  Commonly known as:  CRESTOR  Take 20 mg by mouth once a week. Friday     senna 8.6 MG Tabs tablet  Commonly known as:  SENOKOT  Take 1 tablet by mouth daily as needed for mild constipation.     tamsulosin 0.4 MG Caps capsule  Commonly known as:  FLOMAX  Take 0.4 mg by mouth daily after supper.     tiotropium 18 MCG inhalation capsule  Commonly known as:  SPIRIVA  Place 18 mcg into inhaler and inhale daily.     tiZANidine 4 MG capsule  Commonly known as:  ZANAFLEX  Take 1 capsule (4 mg total) by mouth 3 (three) times daily as needed for muscle spasms.     traMADol 50 MG tablet  Commonly known as:  ULTRAM  Take 1-2 tablets (50-100 mg total) by mouth every 6 (six) hours.     TUDORZA PRESSAIR 400 MCG/ACT Aepb  Generic drug:  Aclidinium Bromide  Inhale 1 puff into the lungs daily.     zinc oxide 11.3 % Crea cream  Commonly known as:  BALMEX  Apply 1 application topically daily. Applied to rash that is in between legs and buttocks area     zolpidem 5 MG tablet  Commonly known as:  AMBIEN  Take 1 tablet (5 mg total) by mouth at bedtime as needed for sleep.         Signed: Anastasio Auerbach. Mayetta Castleman   PAC  08/15/2013, 8:14 AM

## 2013-08-15 NOTE — Progress Notes (Signed)
Clinical Social Work Department CLINICAL SOCIAL WORK PLACEMENT NOTE 08/15/2013  Patient:  [name], [name]  Account Number:  [id number] Admit date:  08/13/2013  Clinical Social Worker:  Cori Razor, LCSW  Date/time:  08/14/2013 11:39 AM  Clinical Social Work is seeking post-discharge placement for this patient at the following level of care:   SKILLED NURSING   (*CSW will update this form in Epic as items are completed)     Patient/family provided with [facility] Department of Clinical Social Work's list of facilities offering this level of care within the geographic area requested by the patient (or if unable, by the patient's family).  08/14/2013  Patient/family informed of their freedom to choose among providers that offer the needed level of care, that participate in Medicare, Medicaid or managed care program needed by the patient, have an available bed and are willing to accept the patient.    Patient/family informed of [facility]' ownership interest in [facility], as well as of the fact that they are under no obligation to receive care at this facility.  PASARR submitted to EDS on 08/13/2013 PASARR number received from EDS on 08/13/2013  FL2 transmitted to all facilities in geographic area requested by pt/family on  08/14/2013 FL2 transmitted to all facilities within larger geographic area on   Patient informed that his/her managed care company has contracts with or will negotiate with  certain facilities, including the following:     Patient/family informed of bed offers received:  08/14/2013 Patient chooses bed at [facility] Physician recommends and patient chooses bed at    Patient to be transferred to [facility] on  08/15/2013 Patient to be transferred to facility by [facility]  The following physician request were entered in Epic:   Additional Comments: [employer] provided authorization for SNF placement  and Ambulance transport.  Cori Razor LCSW [phone]

## 2013-08-15 NOTE — Progress Notes (Signed)
CSW has confirmed d/c plan. Pt will have rehab at [facility] following hospital d/c. [employer] medicare has provided authorization for SNF placement for today / tomorrow and for ambulance transport to SNF.  Cori Razor LCSW [phone]

## 2013-08-16 NOTE — Progress Notes (Signed)
Utilization review completed.  

## 2013-08-20 DIAGNOSIS — Z96649 Presence of unspecified artificial hip joint: Secondary | ICD-10-CM

## 2013-08-20 DIAGNOSIS — I4891 Unspecified atrial fibrillation: Secondary | ICD-10-CM

## 2013-08-20 DIAGNOSIS — I509 Heart failure, unspecified: Secondary | ICD-10-CM

## 2013-08-21 ENCOUNTER — Non-Acute Institutional Stay (SKILLED_NURSING_FACILITY): Payer: Medicare Other | Admitting: Internal Medicine

## 2013-08-21 DIAGNOSIS — I4891 Unspecified atrial fibrillation: Secondary | ICD-10-CM

## 2013-08-21 DIAGNOSIS — I509 Heart failure, unspecified: Secondary | ICD-10-CM

## 2013-08-21 DIAGNOSIS — Z96649 Presence of unspecified artificial hip joint: Secondary | ICD-10-CM

## 2013-08-22 NOTE — Progress Notes (Addendum)
Patient ID: [name], male   DOB: [birth year], 80 y.o.   MRN: [id number]                   HISTORY & PHYSICAL  DATE:  08/20/2013      [facility]    LEVEL OF CARE:   SNF   CHIEF COMPLAINT:  Admission to SNF, [facility], 08/13/2013 through 08/15/2013.    HISTORY OF PRESENT ILLNESS:   This man was admitted for a left total hip arthroplasty which was accompanied without undue postoperative problems.   The patient tells me he was actually being scheduled for a left total knee replacement.  He fell and then developed excruciating pain in his left groin and down to the left knee.  X-rays were done of the hip and, although there was no fracture, he was felt to be suffering from very significant hip pain.  Therefore, the hip was replaced first.  The patient tells me he is still having the left groin to left knee pain.    PAST MEDICAL HISTORY/PROBLEM LIST:  Includes:    Obesity.    Hypertension.    Bilateral lower extremity edema.  Wearing compression stockings.    BPH.    Asthma.    Congestive heart failure with an EF of 40% in February 2014.  However, in February 2015, a repeat echo showed an EF of 55-60%.  He had grade 1 diastolic dysfunction.   Also notable for significant tricuspid regurgitation, mitral regurgitation.  The patient has noted increasing edema.    Gastroesophageal reflux.    AFib.  On amiodarone and Xarelto chronically.    Osteoarthritis of the knees.     Chronic renal failure.     Macular degeneration.    CURRENT MEDICATIONS:  Medication list is reviewed.    Tylenol 500 three times daily.  Takes with his Tramadol 50 mg.    Proventil inhaler q.6 p.r.n.    Amiodarone 200 mg daily.    Bumex 0.5 to 1 mg by mouth two times daily, 0.5 twice daily every other day, 1 tablet in the morning and  tablet in the evening every other day.    Calcium 600 plus D 400, 1 tablet three times a day.    Zyrtec 10 mg p.r.n.  allergy symptoms.      Colace 100 b.i.d.    Ferrous sulfate 325 three times a day.    Neurontin 800 three times a day.    Magnesium 250 a day.    Milk of Magnesia 30 mL daily.    Prilosec 20 q.d.    MiraLAX 17 g b.i.d.    KCl 20 q.d.    Xarelto 20 q.d.    Crestor 20 q.d.    Flomax 0.4 q.d.    Spiriva 18 q.d.    Zanaflex 1 tablet, 4 mg three times a day as needed for muscle spasms.    Ultram 50 q.6.     Tudorza Pressair  400, 1 puff daily.    Ambien 5 mg q.h.s.  p.r.n.    SOCIAL HISTORY:   HOUSING:  The patient tells me he lives at home with his son.  He has five stairs to get into the house.  FUNCTIONAL STATUS:   Uses a walker.  It does not sound like he gets out of the home all that much.  Independent with ADLs.     REVIEW OF SYSTEMS:   CHEST/RESPIRATORY:  He is not  complaining of shortness of breath.     CARDIAC:   No chest pain.     GI:  No nausea, vomiting, or diarrhea.     GU:  He has urinary frequency, although this does not appear to be much different from his norm.     CIRCULATION:  Extremities:    As noted, increasing edema.   MUSCULOSKELETAL:  Continued pain in the left hip radiating to the left knee, as noted.      PHYSICAL EXAMINATION:   GENERAL APPEARANCE:  Pleasant man in no distress.     CHEST/RESPIRATORY:  Bibasilar crackles are noted.  Shallow air entry.     CARDIOVASCULAR:  CARDIAC:  There is a 3/6 pansystolic murmur at the lower left sternal border which increases with inspiration, 2/6 midsystolic murmur at the aortic area.  JVP is elevated to the angle of the jaw.     GASTROINTESTINAL:  LIVER/SPLEEN/KIDNEYS:  No liver, no spleen.  No tenderness.   GENITOURINARY:  BLADDER:   Does not appear to be distended.     CIRCULATION:   EDEMA/VARICOSITIES:  Extremities:  He has edema extending above his knees, especially posteriorly.  He has 1-2+ coccyx edema.    NEUROLOGICAL:    BALANCE/GAIT:  He can bring himself to a standing position with difficulty.  I did not  attempt to ambulate him.       ASSESSMENT/PLAN:  Status post left total hip replacement.  He appears to have tolerated the surgery well.  He is on Xarelto chronically.    Congestive heart failure.  I think this is biventricular.  JVP is elevated at 90 degrees.    Lower extremity pain.  He thinks that some things seem better after the hip surgery.   However, he still complains of groin pain radiating down the medial aspect of his leg into the knee which is severe.    Atrial fibrillation.  Rate is controlled on amiodarone 200 mg a day and Xarelto for CVA prophylaxis.     I am going to verify what exactly they are giving him in terms of the Bumex and increase the dose slightly.  We will monitor his weights, repeat his electrolytes.    CPT CODE: 74163          REFERRING    Stacie Glaze  ORDERING     Hilty, Bridget Hartshorn    Zoila Shutter  SONOGRAPHER  Clearence Ped, RCS  PERFORMING   [facility], Outpatient cc:  ------------------------------------------------------------ LV EF: 55% -   60%  ------------------------------------------------------------ Indications:      425.4 Other primary Cardiomyopathies.  ------------------------------------------------------------ History:   PMH:  Hyperlipidemia  Atrial fibrillation. Congestive heart failure.  Risk factors:  Hypertension.  ------------------------------------------------------------ Study Conclusions  - Left ventricle: The cavity size was normal. Wall thickness   was normal. Systolic function was normal. The estimated   ejection fraction was in the range of 55% to 60%. Wall   motion was normal; there were no regional wall motion   abnormalities. Doppler parameters are consistent with   abnormal left ventricular relaxation (grade 1 diastolic   dysfunction). The E/e' ratio is >10, suggesting elevated   LV filling pressure. - Aortic valve: Sclerosis without stenosis. Transvalvular   velocity was minimally increased. Trivial  regurgitation. - Mitral valve: Calcified annulus. Mild regurgitation. - Left atrium: LA volume/ BSA = 37.1 ml/m2 The atrium was   moderately dilated. - Right atrium: Moderately dilated (22 cm2). - Tricuspid valve: Moderate regurgitation. - Pulmonary  arteries: RVSP: 50mm Hg (S). - Inferior vena cava: The vessel was normal in size; the   respirophasic diameter changes were in the normal range (=   50%); findings are consistent with normal central venous   pressure. Transthoracic echocardiography.  M-mode, complete 2D, spectral Doppler, and color Doppler.  Height:  Height: 172.7cm. Height: 68in.  Weight:  Weight: 91.2kg. Weight: 200.6lb.  Body mass index:  BMI: 30.6kg/m^2.  Body surface area:    BSA: 2.46m^2.  Blood pressure:     102/50.  Patient status:  Outpatient.  Location:  Echo laboratory.  ------------------------------------------------------------  ------------------------------------------------------------ Left ventricle:  The cavity size was normal. Wall thickness was normal. Systolic function was normal. The estimated ejection fraction was in the range of 55% to 60%. Wall motion was normal; there were no regional wall motion abnormalities. Doppler parameters are consistent with abnormal left ventricular relaxation (grade 1 diastolic dysfunction). The E/e' ratio is >10, suggesting elevated LV filling pressure.  ------------------------------------------------------------ Aortic valve:  Sclerosis without stenosis.  Doppler: Transvalvular velocity was minimally increased.  Trivial regurgitation.    VTI ratio of LVOT to aortic valve: 0.61. Peak velocity ratio of LVOT to aortic valve: 0.56.    Mean gradient: 9mm Hg (S). Peak gradient: 16mm Hg (S).  ------------------------------------------------------------ Aorta:  Aortic root: The aortic root was normal in size. Ascending aorta: The ascending aorta was normal in  size.  ------------------------------------------------------------ Mitral valve:   Calcified annulus.  Doppler:   Mild regurgitation.    Valve area by pressure half-time: 2.68cm^2. Indexed valve area by pressure half-time: 1.31cm^2/m^2.    Mean gradient: 3mm Hg (D). Peak gradient: 9mm Hg (D).  ------------------------------------------------------------ Left atrium:  LA volume/ BSA = 37.1 ml/m2 The atrium was moderately dilated.  ------------------------------------------------------------ Right ventricle:  The cavity size was normal. Wall thickness was normal. Systolic function was normal.  ------------------------------------------------------------ Pulmonic valve:    The valve appears to be grossly normal.  Doppler:   Trivial regurgitation.  ------------------------------------------------------------ Tricuspid valve:   Doppler:   Moderate regurgitation.  ------------------------------------------------------------ Pulmonary artery:   The main pulmonary artery was normal-sized.  ------------------------------------------------------------ Right atrium:  Moderately dilated (22 cm2).  ------------------------------------------------------------ Systemic veins: Inferior vena cava: The vessel was normal in size; the respirophasic diameter changes were in the normal range (= 50%); findings are consistent with normal central venous pressure. Diameter: 17mm.  ------------------------------------------------------------

## 2013-08-30 ENCOUNTER — Non-Acute Institutional Stay (SKILLED_NURSING_FACILITY): Payer: Medicare Other | Admitting: Internal Medicine

## 2013-08-30 ENCOUNTER — Encounter: Payer: Self-pay | Admitting: Internal Medicine

## 2013-08-30 DIAGNOSIS — I509 Heart failure, unspecified: Secondary | ICD-10-CM | POA: Insufficient documentation

## 2013-08-30 DIAGNOSIS — I4891 Unspecified atrial fibrillation: Secondary | ICD-10-CM

## 2013-08-30 DIAGNOSIS — J45909 Unspecified asthma, uncomplicated: Secondary | ICD-10-CM

## 2013-08-30 DIAGNOSIS — Z96649 Presence of unspecified artificial hip joint: Secondary | ICD-10-CM

## 2013-08-30 DIAGNOSIS — D649 Anemia, unspecified: Secondary | ICD-10-CM

## 2013-08-30 NOTE — Progress Notes (Signed)
Patient ID: CORDEL WHIPP, male   DOB: [birth year], 80 y.o.   MRN: [id number]   FACILITY: [facility]  LEVEL OF CARE: SNF   CHIEF COMPLAINT: Discharge note  .  HISTORY OF PRESENT ILLNESS: This man was admitted for a left total hip arthroplasty which was accompanied without undue postoperative problems. The patient  Actually  Was  being scheduled for a left total knee replacement. He fell and then developed excruciating pain in his left groin and down to the left knee. X-rays were done of the hip and, although there was no fracture, he was felt to be suffering from very significant hip pain. Apparently he's done quite well with therapy here and recently saw his orthopedic surgeon who agreed.  He will be going home apparently he lives with his son.  Today he has no complaints is looking forward to going home.  When Dr. Leanord Hawking saw him recently he did increase his Bumex slightly secondary apparently to some concerns of edema with suspected history of biventricular CHF-he actually has lost it appears about 7 pounds since admission does not complaining of any shortness of breath or chest pain .  PAST MEDICAL HISTORY/PROBLEM LIST: Includes:  Obesity.  Hypertension.  Bilateral lower extremity edema. Wearing compression stockings.  BPH.  Asthma.  Congestive heart failure with an EF of 40% in February 2014. However, in February 2015, a repeat echo showed an EF of 55-60%. He had grade 1 diastolic dysfunction. Also notable for significant tricuspid regurgitation, mitral regurgitation. The patient has noted increasing edema.  Gastroesophageal reflux.  AFib. On amiodarone and Xarelto chronically.  Osteoarthritis of the knees.  Chronic renal failure.  Macular degeneration.  CURRENT MEDICATIONS: Medication list is reviewed.  Tylenol 500 three times daily. Takes with his Tramadol 50 mg.  Proventil inhaler q.6 p.r.n.  Amiodarone 200 mg daily.  Bumex  1 mg twice a day  Calcium 600 plus D 400, 1 tablet  three times a day.  Zyrtec 10 mg p.r.n. allergy symptoms.  Colace 100 b.i.d.  Ferrous sulfate 325 three times a day.  Neurontin 800 three times a day.  Magnesium 250 a day.  Milk of Magnesia 30 mL daily.  Prilosec 20 q.d.  MiraLAX 17 g daily.  KCl 20 meq bid.  Xarelto 20 q.d.  Crestor 20 q.weekly.  Flomax 0.4 q.d.  Spiriva 18 q.d.  Zanaflex 1 tablet, 4 mg three times a day as needed for muscle spasms.  Ultram 50 q.6.  Tudorza Pressair 400, 1 puff daily.  Ambien 5 mg q.h.s. p.r.n.  SOCIAL HISTORY:  HOUSING: The patient  he lives at home with his son. He has five stairs to get into the house.  FUNCTIONAL STATUS: Uses a walker. It does not sound like he gets out of the home all that much. Independent with ADLs.   REVIEW OF SYSTEMS:  Gen. no complaints of fever or chills.  Skin--apparently has had a rash legs buttocks area that is resolving CHEST/RESPIRATORY: He is not complaining of shortness of breath.  CARDIAC: No chest pain.  GI: No nausea, vomiting, or diarrhea.  GU: He has urinary frequency, although this does not appear to be much different from his norm.  CIRCULATION: Extremities: As noted, has chronic lower extremity edema  MUSCULOSKELETAL: Does not complaining of hip pain today apparently this is better than it was when he saw Dr. Leanord Hawking.  Neurologic-does not complaining of numbness tingling or any syncopal episodes or headache .  PHYSICAL EXAMINATION T-. 97.4 pulse 60  respirations 16 blood pressure 120/56:    GENERAL APPEARANCE: Pleasant man in no distress Skin is warm and dry-note surgical site on the left hip area appears to be healing fairly unremarkably there is some dry crusting--wound is well approximated-- some mild postop erythema this does not appear to be cellulitic there is no drainage or bleeding.  CHEST/RESPIRATORY: Clear to auscultation no labored breathing. Shallow air entry.  CARDIOVASCULAR:  CARDIAC: There is a 3/6 pansystolic murmur at the lower  left sternal border--irregular rate and rhythm with an occasional irregular beat.  GASTROINTESTINAL:  Abdomen is soft nontender with positive bowel sounds.     CIRCULATION:  EDEMA/VARICOSITIES: Extremities: He continues to have I would say to2- 3+ edema again his weight has gone down about 7 pounds since his admission I suspect this was fluid relatedl NEUROLOGICAL:  BALANCE/GAIT: He can bring himself to a standing position with difficulty. And is able to ambulate with a rolling walker.  Neurologic-cranial nerves grossly intact speech is clear no lateralizing findings  Labs.  08/28/2013.  WBC 6.5 hemoglobin 8.9 platelets 420.  Sodium 136 potassium 4.3 BUN 13 creatinine 0.8.   ASSESSMENT/PLAN:  Status post left total hip replacement. He appears to have tolerated the surgery well. He is on Xarelto chronically.--Will need continued PT and OT at home  Congestive heart failure. Per Dr. Jannetta Quintobson's assessment thought to be biventricular-he did increase his Bumex slightly as well as potassium electrolytes and renal function appear to be stable will recheck this tomorrow to assure stability-he is also appears lost some fluid does not complaining of any increased shortness of breath or chest pain at this point appears clinically stable  Lower extremity pain. Apparently this is significantly better   Atrial fibrillation. Rate is controlled on amiodarone 200 mg a day and Xarelto for CVA prophylaxis  Anemia--per recent CBC hemoglobin is slowly rising he is on iron   Asthma this appears to be stable on current medications   Of note patient will need home health and nursing support for his medical issues including followup of CHF and atrial fibrillation.  As well as some residual weakness from the surgery.  Again will need continued PT and OT as well for strengthening  [id number]-of note greater than 30 minutes spent on this discharge summary-greater than 50% of time spent coordinating plan of  care.  .   cc:  ------------------------------------------------------------

## 2013-09-13 ENCOUNTER — Ambulatory Visit (INDEPENDENT_AMBULATORY_CARE_PROVIDER_SITE_OTHER): Payer: Medicare Other | Admitting: Family

## 2013-09-13 ENCOUNTER — Encounter: Payer: Self-pay | Admitting: Cardiovascular Disease

## 2013-09-13 ENCOUNTER — Encounter: Payer: Self-pay | Admitting: Family

## 2013-09-13 ENCOUNTER — Ambulatory Visit (HOSPITAL_COMMUNITY): Payer: Medicare Other | Attending: Cardiovascular Disease | Admitting: Cardiology

## 2013-09-13 VITALS — BP 120/70 | HR 117 | Temp 98.5°F | Ht 68.0 in | Wt 205.5 lb

## 2013-09-13 DIAGNOSIS — R609 Edema, unspecified: Secondary | ICD-10-CM

## 2013-09-13 DIAGNOSIS — M7989 Other specified soft tissue disorders: Secondary | ICD-10-CM

## 2013-09-13 DIAGNOSIS — Z96649 Presence of unspecified artificial hip joint: Secondary | ICD-10-CM

## 2013-09-13 DIAGNOSIS — R600 Localized edema: Secondary | ICD-10-CM

## 2013-09-13 DIAGNOSIS — M79609 Pain in unspecified limb: Secondary | ICD-10-CM | POA: Insufficient documentation

## 2013-09-13 LAB — CBC WITH DIFFERENTIAL/PLATELET
BASOS ABS: 0 10*3/uL (ref 0.0–0.1)
Basophils Relative: 0.5 % (ref 0.0–3.0)
EOS PCT: 2.2 % (ref 0.0–5.0)
Eosinophils Absolute: 0.1 10*3/uL (ref 0.0–0.7)
HCT: 29.6 % — ABNORMAL LOW (ref 39.0–52.0)
Hemoglobin: 9.7 g/dL — ABNORMAL LOW (ref 13.0–17.0)
Lymphocytes Relative: 29.3 % (ref 12.0–46.0)
Lymphs Abs: 1.7 10*3/uL (ref 0.7–4.0)
MCHC: 32.9 g/dL (ref 30.0–36.0)
MCV: 94.5 fl (ref 78.0–100.0)
MONO ABS: 0.7 10*3/uL (ref 0.1–1.0)
MONOS PCT: 12.9 % — AB (ref 3.0–12.0)
Neutro Abs: 3.2 10*3/uL (ref 1.4–7.7)
Neutrophils Relative %: 55.1 % (ref 43.0–77.0)
PLATELETS: 295 10*3/uL (ref 150.0–400.0)
RBC: 3.13 Mil/uL — ABNORMAL LOW (ref 4.22–5.81)
RDW: 16.3 % — ABNORMAL HIGH (ref 11.5–14.6)
WBC: 5.7 10*3/uL (ref 4.5–10.5)

## 2013-09-13 LAB — BASIC METABOLIC PANEL
BUN: 17 mg/dL (ref 6–23)
CO2: 34 mEq/L — ABNORMAL HIGH (ref 19–32)
CREATININE: 0.9 mg/dL (ref 0.4–1.5)
Calcium: 9 mg/dL (ref 8.4–10.5)
Chloride: 95 mEq/L — ABNORMAL LOW (ref 96–112)
GFR: 84.28 mL/min (ref >60.00)
Glucose, Bld: 115 mg/dL — ABNORMAL HIGH (ref 70–99)
Potassium: 3.4 mEq/L — ABNORMAL LOW (ref 3.5–5.1)
Sodium: 138 mEq/L (ref 135–145)

## 2013-09-13 LAB — PROTIME-INR
INR: 1.3 ratio — AB (ref 0.8–1.0)
Prothrombin Time: 14.7 s — ABNORMAL HIGH (ref 9.6–13.1)

## 2013-09-13 NOTE — Patient Instructions (Signed)

## 2013-09-13 NOTE — Progress Notes (Signed)
Pre visit review using our clinic review tool, if applicable. No additional management support is needed unless otherwise documented below in the visit note. 

## 2013-09-13 NOTE — Progress Notes (Signed)
Subjective:    Patient ID: [name], male    DOB: 1927/03/29, 80 y.o.   MRN: [id number]  HPI 80 year old white male, nonsmoker, patient of Dr. Lovell Sheehan is in today status post total left hip replacement and discharged on 08/13/2013 2 nursing home facility for rehabilitation. He's been doing well. However, has concerns of left lower extremity edema as in present post surgery. Has a history of lymphedema in the left leg. Reports having pain he rates as 5-6/10, worse with walking and flexing his ankle. Has been attempting to wear TED hose with great difficulty.   Review of Systems  Constitutional: Negative.   HENT: Negative.   Respiratory: Negative.   Cardiovascular: Negative.   Gastrointestinal: Negative.   Endocrine: Negative.   Genitourinary: Negative.   Musculoskeletal:       S/p left hip replacement. Left lower extremity swelling.   Skin: Negative.   Allergic/Immunologic: Negative.   Neurological: Negative.   Hematological: Negative.   Psychiatric/Behavioral: Negative.    Past Medical History  Diagnosis Date  . Obesity   . Hypertension   . Edema     Bilateral - left lower leg greater than right- wears compression stockings  . BPH (benign prostatic hypertrophy)   . Asthma   . Bronchospasm, exercise-induced   . CHF (congestive heart failure)     systolic, EF 40% (07/07/2012)  . GERD (gastroesophageal reflux disease)   . A-fib   . Shortness of breath   . Dyslipidemia   . Arthritis     knees  . Chronic kidney disease     stabilized, due to infection  . Macular degeneration 08-06-13    bilateral -sight impaired    History   Social History  . Marital Status: Widowed    Spouse Name: N/A    Number of Children: N/A  . Years of Education: N/A   Occupational History  . retired    Social History Main Topics  . Smoking status: Former Smoker    Quit date: 05/17/1964  . Smokeless tobacco: Never Used  . Alcohol Use: No  . Drug Use: No  . Sexual Activity: Not  Currently   Other Topics Concern  . Not on file   Social History Narrative  . No narrative on file    Past Surgical History  Procedure Laterality Date  . Vascular surgery  removed varicose veins  . Vasectomy    . Cardioversion N/A 07/07/2012    Procedure: CARDIOVERSION;  Surgeon: Chrystie Nose, MD;  Location: [facility] ENDOSCOPY;  Service: Cardiovascular;  Laterality: N/A;  . Cardioversion N/A 08/30/2012    Procedure: CARDIOVERSION;  Surgeon: Chrystie Nose, MD;  Location: [facility] ENDOSCOPY;  Service: Cardiovascular;  Laterality: N/A;  . Transthoracic echocardiogram  07/07/2012    ef 40%; mild MR; LA mod-severely dilated; RA mildly dilated; RV systolic function mildly reduced; RV systolic pressure increase - mod pulm htn  . Total hip arthroplasty Left 08/13/2013    Procedure: LEFT TOTAL HIP ARTHROPLASTY ANTERIOR APPROACH;  Surgeon: [name], MD;  Location: [facility] ORS;  Service: Orthopedics;  Laterality: Left;    Family History  Problem Relation Age of Onset  . Alzheimer's disease Mother   . Heart disease Father     Allergies  Allergen Reactions  . Nsaids     Stomach pain  . Other     Opiates cause tightness in chest  . Vicodin [Hydrocodone-Acetaminophen] Other (See Comments)    Unknown reaction many years ago  . Morphine And  Related Nausea And Vomiting    Dizziness, light headed     Current Outpatient Prescriptions on File Prior to Visit  Medication Sig Dispense Refill  . acetaminophen (TYLENOL) 500 MG tablet Take 500 mg by mouth 3 (three) times daily. Takes with Tramadol      . Aclidinium Bromide (TUDORZA PRESSAIR) 400 MCG/ACT AEPB Inhale 1 puff into the lungs daily.      [name]. albuterol (PROVENTIL HFA;VENTOLIN HFA) 108 (90 BASE) MCG/ACT inhaler Inhale 1 puff into the lungs every 6 (six) hours as needed for wheezing or shortness of breath.      [name]. amiodarone (PACERONE) 200 MG tablet Take 200 mg by mouth every morning.      . bumetanide (BUMEX) 1 MG tablet Take 1 mg by mouth 2 (two)  times daily.       . Calcium Carbonate-Vitamin D (CALCIUM 600+D) 600-400 MG-UNIT per tablet Take 1 tablet by mouth 3 (three) times a week. Monday Wednesday and Friday      . cetirizine (ZYRTEC) 10 MG tablet Take 10 mg by mouth daily as needed for allergies.      [name]. docusate sodium 100 MG CAPS Take 100 mg by mouth 2 (two) times daily.  10 capsule  0  . ferrous sulfate 325 (65 FE) MG tablet Take 1 tablet (325 mg total) by mouth 3 (three) times daily after meals.    3  . gabapentin (NEURONTIN) 800 MG tablet Take 800 mg by mouth 3 (three) times daily.      . Magnesium 250 MG TABS Take 250 mg by mouth daily.       . magnesium hydroxide (MILK OF MAGNESIA) 400 MG/5ML suspension Take 30 mLs by mouth daily as needed for mild constipation.      . Multiple Vitamin (MULTIVITAMIN WITH MINERALS) TABS tablet Take 1 tablet by mouth daily.      [name]. omeprazole (PRILOSEC) 20 MG capsule Take 20 mg by mouth every morning.      . polyethylene glycol (MIRALAX / GLYCOLAX) packet Take 17 g by mouth 2 (two) times daily.  14 each  0  . potassium chloride SA (K-DUR,KLOR-CON) 20 MEQ tablet Take 20 mEq by mouth 2 (two) times daily.      . Rivaroxaban (XARELTO) 20 MG TABS tablet Take 1 tablet (20 mg total) by mouth at bedtime.  35 tablet  0  . rosuvastatin (CRESTOR) 20 MG tablet Take 20 mg by mouth once a week. Friday      . senna (SENOKOT) 8.6 MG TABS tablet Take 1 tablet by mouth daily as needed for mild constipation.      . tamsulosin (FLOMAX) 0.4 MG CAPS capsule Take 0.4 mg by mouth daily after supper.      . tiotropium (SPIRIVA) 18 MCG inhalation capsule Place 18 mcg into inhaler and inhale daily.      [name]. tiZANidine (ZANAFLEX) 4 MG capsule Take 1 capsule (4 mg total) by mouth 3 (three) times daily as needed for muscle spasms.  50 capsule  0  . traMADol (ULTRAM) 50 MG tablet Take 1-2 tablets (50-100 mg total) by mouth every 6 (six) hours.  100 tablet  0  . zinc oxide (BALMEX) 11.3 % CREA cream Apply 1 application topically  daily. Applied to rash that is in between legs and buttocks area      . zolpidem (AMBIEN) 5 MG tablet Take 1 tablet (5 mg total) by mouth at bedtime as needed for sleep.  30 tablet  5  No current facility-administered medications on file prior to visit.    BP 120/70  Pulse 117  Temp(Src) 98.5 F (36.9 C) (Oral)  Ht 5\' 8"  (1.727 m)  Wt 205 lb 8 oz (93.214 kg)  BMI 31.25 kg/m2chart    Objective:   Physical Exam  Constitutional: He is oriented to person, place, and time. He appears well-developed and well-nourished.  HENT:  Right Ear: External ear normal.  Left Ear: External ear normal.  Nose: Nose normal.  Mouth/Throat: Oropharynx is clear and moist.  Neck: Normal range of motion. Neck supple.  Cardiovascular: Normal rate, regular rhythm and normal heart sounds.   Pulmonary/Chest: Effort normal and breath sounds normal.  Abdominal: Soft. Bowel sounds are normal.  Musculoskeletal: Normal range of motion. He exhibits edema and tenderness.  Significant swelling to the left lower extremity. Pedal pulses one out of two.  Pain ellicited with flexion of the ankle.   Neurological: He is alert and oriented to person, place, and time.  Skin: Skin is warm and dry.  Psychiatric: He has a normal mood and affect.          Assessment & Plan:  [name] was seen today for follow-up.  Diagnoses and associated orders for this visit:  Edema of left lower extremity - Lower Extremity Venous Duplex Left; Future - Basic Metabolic Panel - CBC with Differential - Protime-INR  Status post hip replacement - Lower Extremity Venous Duplex Left; Future - Basic Metabolic Panel - CBC with Differential - Protime-INR   Will follow-up pending labs and LE doppler to r/o DVT.

## 2013-09-13 NOTE — Progress Notes (Signed)
Lower venous duplex limited completed 

## 2013-09-14 ENCOUNTER — Telehealth: Payer: Self-pay

## 2013-09-14 ENCOUNTER — Encounter (HOSPITAL_COMMUNITY): Payer: Medicare Other

## 2013-09-14 ENCOUNTER — Other Ambulatory Visit: Payer: Self-pay | Admitting: Family

## 2013-09-14 DIAGNOSIS — E876 Hypokalemia: Secondary | ICD-10-CM

## 2013-09-14 MED ORDER — POTASSIUM CHLORIDE CRYS ER 20 MEQ PO TBCR
20.0000 meq | EXTENDED_RELEASE_TABLET | Freq: Two times a day (BID) | ORAL | Status: DC
Start: 2013-09-14 — End: 2013-10-31

## 2013-09-14 NOTE — Telephone Encounter (Signed)
Pt aware of results. He feels like he is retaining fluid. Per Padonda, increase Bumex to 2 tabs bid x 2 days and call Monday morning to let us know how pt is doing. Pt's son, Onalee Hua is aware and verbalized understanding

## 2013-09-14 NOTE — Telephone Encounter (Signed)
Pt's son aware of results and will call back to schedule lab only visit for rechk

## 2013-09-14 NOTE — Telephone Encounter (Signed)
Message copied by Beverely Low on Fri Sep 14, 2013  8:34 AM ------      Message from: Adline Mango B      Created: Fri Sep 14, 2013  8:31 AM      Regarding: FW: prelim       Negative DVT. Please advise patient.              PC      ----- Message -----         From: Richardean Canal         Sent: 09/13/2013   2:38 PM           To: Baker Pierini, FNP      Subject: prelim                                                   Left leg venous duplex appears negative for acute thrombus.             ------

## 2013-09-19 ENCOUNTER — Telehealth: Payer: Self-pay | Admitting: Internal Medicine

## 2013-09-19 DIAGNOSIS — R609 Edema, unspecified: Secondary | ICD-10-CM

## 2013-09-19 NOTE — Telephone Encounter (Signed)
Left message on machine for [facility]

## 2013-09-19 NOTE — Telephone Encounter (Signed)
Eunice Blase is aware the pt saw Padonda and diuretics were increased for 2 days, still no improvement.   R calf:  09/12/13- 42 cm and 09/19/13- 44 cm R ankle:  09/12/13- 25 cm and 09/19/13- 24 cm  L calf:  09/12/13- 45 cm and 09/19/13 - 48 cm L ankle:  09/12/13- 30 cm and 09/19/13 - 28 cm   Pt is wearing TED hose on both legs.  Eunice Blase would like a callback with plan of care.

## 2013-09-19 NOTE — Telephone Encounter (Signed)
Referring patient to vascular specialist.

## 2013-09-20 ENCOUNTER — Telehealth: Payer: Self-pay | Admitting: Internal Medicine

## 2013-09-20 NOTE — Telephone Encounter (Signed)
Pt son is calling back to inform NP that doubling up on bumex is not making a different. Pt son is aware NP is out of office this afternoon

## 2013-09-21 NOTE — Telephone Encounter (Signed)
Referral was placed but did not get dropped into the correct workque due to incorrect location. [facility]. Dana at Vascular changed order and will call pt to schedule

## 2013-09-21 NOTE — Telephone Encounter (Signed)
Please advise 

## 2013-09-21 NOTE — Telephone Encounter (Signed)
Please check status of referral. Is he wearing compression hose.

## 2013-09-26 ENCOUNTER — Other Ambulatory Visit: Payer: Self-pay | Admitting: Internal Medicine

## 2013-10-01 ENCOUNTER — Other Ambulatory Visit (INDEPENDENT_AMBULATORY_CARE_PROVIDER_SITE_OTHER): Payer: Medicare Other

## 2013-10-01 ENCOUNTER — Other Ambulatory Visit: Payer: Self-pay | Admitting: *Deleted

## 2013-10-01 DIAGNOSIS — E876 Hypokalemia: Secondary | ICD-10-CM

## 2013-10-01 DIAGNOSIS — R600 Localized edema: Secondary | ICD-10-CM

## 2013-10-01 LAB — BASIC METABOLIC PANEL
BUN: 17 mg/dL (ref 6–23)
CO2: 30 mEq/L (ref 19–32)
CREATININE: 0.9 mg/dL (ref 0.4–1.5)
Calcium: 8.8 mg/dL (ref 8.4–10.5)
Chloride: 101 mEq/L (ref 96–112)
GFR: 82.19 mL/min (ref >60.00)
Glucose, Bld: 106 mg/dL — ABNORMAL HIGH (ref 70–99)
POTASSIUM: 3.4 meq/L — AB (ref 3.5–5.1)
Sodium: 139 mEq/L (ref 135–145)

## 2013-10-10 ENCOUNTER — Telehealth: Payer: Self-pay | Admitting: Internal Medicine

## 2013-10-10 NOTE — Telephone Encounter (Signed)
Home health nurse called to report patient's blood pressure being 95/35, 82/42. After AM meds. Prior to taking meds  Blood pressure 120/80. No signs of dizziness upon standing. Takes bumex 1 mg BID. Nurse requesting order to have patient to hold PM dose of Bumex. Request forwarded to Dr. Minerva Areola for review.

## 2013-10-10 NOTE — Telephone Encounter (Signed)
Ok to hold bumex dose. If bp is consistently low, he will need to be seen.  Dr. Rexene Edison

## 2013-10-10 NOTE — Telephone Encounter (Signed)
Returned a call to [employer] informing her per Dr. Rennis Golden okay to hold PM dose of Bumex. If symptoms persist appointment will be needed.

## 2013-10-15 ENCOUNTER — Telehealth: Payer: Self-pay | Admitting: Internal Medicine

## 2013-10-15 NOTE — Telephone Encounter (Signed)
Spoke with pt to advise him that results were released through MyChart and I advised him of the results and Padonda's note. Pt states that he has been taking potassium bid and has an appointment with PCP this month. He will have it rechecked at that visit

## 2013-10-15 NOTE — Telephone Encounter (Signed)
Pt son Onalee Hua called to get results of pt labs that was ordered on  5/18 /15 by Padonda  . Son wants to know if pt should continue to take potassium chloride SA (K-DUR,KLOR-CON) 20 MEQ tablet

## 2013-10-24 ENCOUNTER — Encounter: Payer: Self-pay | Admitting: Vascular Surgery

## 2013-10-24 ENCOUNTER — Other Ambulatory Visit: Payer: Self-pay | Admitting: Internal Medicine

## 2013-10-24 ENCOUNTER — Telehealth: Payer: Self-pay | Admitting: Internal Medicine

## 2013-10-24 NOTE — Telephone Encounter (Signed)
[name] from [facility] pt is do for recertification on 11/03/13 and today was her last authorized visit she said she need authorization  for another visit next week  She plan to discharge however pt is having episodes of low bp mid morning and this has been going on for the last 3 weeks . bp this morning at 10am 90/46 and  2 hours earlier bp was 140/53  Her phone number is [phone]

## 2013-10-25 ENCOUNTER — Encounter: Payer: Self-pay | Admitting: Vascular Surgery

## 2013-10-25 ENCOUNTER — Ambulatory Visit (INDEPENDENT_AMBULATORY_CARE_PROVIDER_SITE_OTHER): Payer: Medicare Other | Admitting: Vascular Surgery

## 2013-10-25 ENCOUNTER — Ambulatory Visit (HOSPITAL_COMMUNITY)
Admission: RE | Admit: 2013-10-25 | Discharge: 2013-10-25 | Disposition: A | Discharge status: Home / Self Care | Payer: Medicare Other | Source: Ambulatory Visit | Attending: Vascular Surgery | Admitting: Vascular Surgery

## 2013-10-25 VITALS — BP 121/49 | HR 56 | Resp 18 | Ht 69.0 in | Wt 207.0 lb

## 2013-10-25 DIAGNOSIS — R600 Localized edema: Secondary | ICD-10-CM

## 2013-10-25 DIAGNOSIS — R609 Edema, unspecified: Secondary | ICD-10-CM

## 2013-10-25 NOTE — Progress Notes (Signed)
[facility] [place] HISTORY AND PHYSICAL   History of Present Illness:  Patient is a 80 y.o. year old male who presents for evaluation of left leg swelling.  The patient has had chronic problems with leg swelling over the last 20 years. He has intermittently worn compression stockings for this. He previously had stripping of the left and right greater saphenous veins. He recently had replacement of his left hip. After that he developed worsening swelling of the left leg. This has improved somewhat but not completely return to baseline. He denies any history of DVT. He does not describe claudication symptoms. However he is not very ambulatory due to his hip replacement. He currently is wearing compression stockings that his son the system and wearing.  Other medical problems include obesity, hypertension, asthma, recent left rib fracture, congestive heart failure, atrial fibrillation, multi-joint arthritis, and macular degeneration all of which are currently stable.  Past Medical History  Diagnosis Date  . Obesity   . Hypertension   . Edema     Bilateral - left lower leg greater than right- wears compression stockings  . BPH (benign prostatic hypertrophy)   . Asthma   . Bronchospasm, exercise-induced   . CHF (congestive heart failure)     systolic, EF 40% (07/07/2012)  . GERD (gastroesophageal reflux disease)   . A-fib   . Shortness of breath   . Dyslipidemia   . Arthritis     knees  . Chronic kidney disease     stabilized, due to infection  . Macular degeneration 08-06-13    bilateral -sight impaired    Past Surgical History  Procedure Laterality Date  . Vascular surgery  removed varicose veins  . Vasectomy    . Cardioversion N/A 07/07/2012    Procedure: CARDIOVERSION;  Surgeon: Chrystie Nose, MD;  Location: [facility] ENDOSCOPY;  Service: Cardiovascular;  Laterality: N/A;  . Cardioversion N/A 08/30/2012    Procedure: CARDIOVERSION;  Surgeon: Chrystie Nose, MD;   Location: [facility] ENDOSCOPY;  Service: Cardiovascular;  Laterality: N/A;  . Transthoracic echocardiogram  07/07/2012    ef 40%; mild MR; LA mod-severely dilated; RA mildly dilated; RV systolic function mildly reduced; RV systolic pressure increase - mod pulm htn  . Total hip arthroplasty Left 08/13/2013    Procedure: LEFT TOTAL HIP ARTHROPLASTY ANTERIOR APPROACH;  Surgeon: [name], MD;  Location: [facility] ORS;  Service: Orthopedics;  Laterality: Left;    Social History History  Substance Use Topics  . Smoking status: Former Smoker    Quit date: 05/17/1964  . Smokeless tobacco: Never Used  . Alcohol Use: No    Family History Family History  Problem Relation Age of Onset  . Alzheimer's disease Mother   . Heart disease Father     Allergies  Allergies  Allergen Reactions  . Nsaids     Stomach pain  . Other     Opiates cause tightness in chest  . Vicodin [Hydrocodone-Acetaminophen] Other (See Comments)    Unknown reaction many years ago  . Morphine And Related Nausea And Vomiting    Dizziness, light headed      Current Outpatient Prescriptions  Medication Sig Dispense Refill  . acetaminophen (TYLENOL) 500 MG tablet Take 500 mg by mouth 3 (three) times daily. Takes with Tramadol      . Aclidinium Bromide (TUDORZA PRESSAIR) 400 MCG/ACT AEPB Inhale 1 puff into the lungs daily.      [name] albuterol (PROVENTIL HFA;VENTOLIN HFA) 108 (90 BASE) MCG/ACT inhaler  Inhale 1 puff into the lungs every 6 (six) hours as needed for wheezing or shortness of breath.      [name] amiodarone (PACERONE) 200 MG tablet Take 200 mg by mouth every morning.      . bumetanide (BUMEX) 1 MG tablet Take 1 mg by mouth 2 (two) times daily.       . Calcium Carbonate-Vitamin D (CALCIUM 600+D) 600-400 MG-UNIT per tablet Take 1 tablet by mouth 3 (three) times a week. Monday Wednesday and Friday      . cetirizine (ZYRTEC) 10 MG tablet Take 10 mg by mouth daily as needed for allergies.      [name] docusate sodium 100 MG CAPS Take 100  mg by mouth 2 (two) times daily.  10 capsule  0  . ferrous sulfate 325 (65 FE) MG tablet Take 1 tablet (325 mg total) by mouth 3 (three) times daily after meals.    3  . gabapentin (NEURONTIN) 800 MG tablet Take 800 mg by mouth 3 (three) times daily.      . Magnesium 250 MG TABS Take 250 mg by mouth daily.       . magnesium hydroxide (MILK OF MAGNESIA) 400 MG/5ML suspension Take 30 mLs by mouth daily as needed for mild constipation.      . Multiple Vitamin (MULTIVITAMIN WITH MINERALS) TABS tablet Take 1 tablet by mouth daily.      [name] omeprazole (PRILOSEC) 20 MG capsule Take 20 mg by mouth every morning.      . polyethylene glycol (MIRALAX / GLYCOLAX) packet Take 17 g by mouth 2 (two) times daily.  14 each  0  . potassium chloride SA (K-DUR,KLOR-CON) 20 MEQ tablet Take 1 tablet (20 mEq total) by mouth 2 (two) times daily.  30 tablet  2  . rosuvastatin (CRESTOR) 20 MG tablet Take 20 mg by mouth once a week. Friday      . senna (SENOKOT) 8.6 MG TABS tablet Take 1 tablet by mouth daily as needed for mild constipation.      . tamsulosin (FLOMAX) 0.4 MG CAPS capsule Take 0.4 mg by mouth daily after supper.      . tiotropium (SPIRIVA) 18 MCG inhalation capsule Place 18 mcg into inhaler and inhale daily.      [name] tiZANidine (ZANAFLEX) 4 MG capsule Take 1 capsule (4 mg total) by mouth 3 (three) times daily as needed for muscle spasms.  50 capsule  0  . traMADol (ULTRAM) 50 MG tablet Take 1-2 tablets (50-100 mg total) by mouth every 6 (six) hours.  100 tablet  0  . XARELTO 20 MG TABS tablet TAKE ONE TABLET BY MOUTH ONCE DAILY  30 tablet  0  . zinc oxide (BALMEX) 11.3 % CREA cream Apply 1 application topically daily. Applied to rash that is in between legs and buttocks area      . zolpidem (AMBIEN) 5 MG tablet Take 1 tablet (5 mg total) by mouth at bedtime as needed for sleep.  30 tablet  5   No current facility-administered medications for this visit.    ROS:   General:  No weight loss, Fever,  chills  HEENT: No recent headaches, no nasal bleeding, no visual changes, no sore throat  Neurologic: No dizziness, blackouts, seizures. No recent symptoms of stroke or mini- stroke. No recent episodes of slurred speech, or temporary blindness.  Cardiac: No recent episodes of chest pain/pressure, no shortness of breath at rest.  +shortness of breath with exertion.  + history  of atrial fibrillation or irregular heartbeat  Vascular: No history of rest pain in feet.  No history of claudication.  No history of non-healing ulcer, No history of DVT   Pulmonary: No home oxygen, no productive cough, no hemoptysis,  No asthma or wheezing  Musculoskeletal:  [x ] Arthritis, [x ] Low back pain,  [ x] Joint pain  Hematologic:No history of hypercoagulable state.  No history of easy bleeding.  No history of anemia  Gastrointestinal: No hematochezia or melena,  No gastroesophageal reflux, no trouble swallowing  Urinary: [x ] chronic Kidney disease, [ ]  on HD - [ ]  MWF or [ ]  TTHS, [ ]  Burning with urination, [ ]  Frequent urination, [ ]  Difficulty urinating;   Skin: No rashes  Psychological: No history of anxiety,  No history of depression   Physical Examination  Filed Vitals:   10/25/13 1307  BP: 121/49  Pulse: 56  Resp: 18  Height: 5\' 9"  (1.753 m)  Weight: 207 lb (93.895 kg)    Body mass index is 30.55 kg/(m^2).  General:  Alert and oriented, no acute distress HEENT: Normal Neck: No bruit or JVD Pulmonary:  Bilateral basilar crackles Cardiac: Regular Rate and Rhythm without murmur Abdomen: Soft, non-tender, non-distended, no mass Skin: No rash Extremity Pulses:  2+ radial, brachial, femoral, dorsalis pedis, posterior tibial pulses bilaterally Musculoskeletal: No deformity trace edema left leg greater than right primarily from the knee down  Neurologic: Upper and lower extremity motor 5/5 and symmetric  DATA:  Patient had a venous duplex exam today. The left greater saphenous vein  has been removed. The lesser saphenous vein has no significant reflux. He did have evidence of diffuse venous reflux throughout the deep system on the left.   ASSESSMENT:  Deep vein reflux no evidence of arterial pathology no significant superficial reflux left leg. Left leg swelling secondary to poor venous return from the vein incompetence. Probably also some component of dependence due to him being in a seated position most of the time after his hip replacement   PLAN:  I discussed with the patient and his daughter today the pathophysiology of venous reflux. I also discussed with them that there no real good interventions for deep vein reflux. Mainstay of therapy at this point will be compression therapy as he is currently doing and replacing the compression stockings intermittently. He is not at risk of limb loss and has no evidence of DVT today. He will followup on as-needed basis.  Fabienne Brunsharles Iceis Knab, MD [facility] [place] Office: [phone] Pager: [phone]

## 2013-10-25 NOTE — Telephone Encounter (Signed)
Ok per Dr Lovell Sheehan verbal order given on [name]'s voicemail.  Also asked her to call me back about BP readings

## 2013-10-31 ENCOUNTER — Other Ambulatory Visit: Payer: Self-pay | Admitting: Internal Medicine

## 2013-10-31 ENCOUNTER — Other Ambulatory Visit: Payer: Self-pay | Admitting: Family

## 2013-11-06 ENCOUNTER — Telehealth: Payer: Self-pay | Admitting: Internal Medicine

## 2013-11-06 NOTE — Telephone Encounter (Signed)
Pt would like to know if you will accept him as a pt. Pt has been a long time pt of dr Lovell Sheehan, (as most have) does not want to go with the new dr , NP or PA.  daughter states she wants someone w/ experience.

## 2013-11-07 ENCOUNTER — Ambulatory Visit: Payer: Medicare Other | Admitting: Internal Medicine

## 2013-11-07 NOTE — Telephone Encounter (Signed)
Daughter aware and appt sch

## 2013-11-07 NOTE — Telephone Encounter (Signed)
ok 

## 2013-11-20 ENCOUNTER — Ambulatory Visit (INDEPENDENT_AMBULATORY_CARE_PROVIDER_SITE_OTHER): Payer: Medicare Other | Admitting: Internal Medicine

## 2013-11-20 ENCOUNTER — Encounter: Payer: Self-pay | Admitting: Internal Medicine

## 2013-11-20 VITALS — BP 118/60 | HR 62 | Temp 98.3°F | Resp 20 | Ht 65.5 in | Wt 209.0 lb

## 2013-11-20 DIAGNOSIS — Z23 Encounter for immunization: Secondary | ICD-10-CM | POA: Diagnosis not present

## 2013-11-20 DIAGNOSIS — I5020 Unspecified systolic (congestive) heart failure: Secondary | ICD-10-CM

## 2013-11-20 DIAGNOSIS — Z7901 Long term (current) use of anticoagulants: Secondary | ICD-10-CM

## 2013-11-20 DIAGNOSIS — E785 Hyperlipidemia, unspecified: Secondary | ICD-10-CM | POA: Diagnosis not present

## 2013-11-20 DIAGNOSIS — I10 Essential (primary) hypertension: Secondary | ICD-10-CM

## 2013-11-20 DIAGNOSIS — R609 Edema, unspecified: Secondary | ICD-10-CM | POA: Diagnosis not present

## 2013-11-20 DIAGNOSIS — R600 Localized edema: Secondary | ICD-10-CM

## 2013-11-20 DIAGNOSIS — I509 Heart failure, unspecified: Secondary | ICD-10-CM

## 2013-11-20 DIAGNOSIS — M47817 Spondylosis without myelopathy or radiculopathy, lumbosacral region: Secondary | ICD-10-CM

## 2013-11-20 DIAGNOSIS — M47896 Other spondylosis, lumbar region: Secondary | ICD-10-CM

## 2013-11-20 DIAGNOSIS — Z96649 Presence of unspecified artificial hip joint: Secondary | ICD-10-CM | POA: Diagnosis not present

## 2013-11-20 MED ORDER — POTASSIUM CHLORIDE CRYS ER 20 MEQ PO TBCR: 20.0000 meq | EXTENDED_RELEASE_TABLET | Freq: Every day | ORAL | Status: DC

## 2013-11-20 MED ORDER — TIOTROPIUM BROMIDE MONOHYDRATE 18 MCG IN CAPS: 18.0000 ug | ORAL_CAPSULE | Freq: Every day | RESPIRATORY_TRACT | Status: DC

## 2013-11-20 NOTE — Progress Notes (Signed)
Subjective:    Patient ID: [name], male    DOB: 04-Sep-1926, 80 y.o.   MRN: [id number]  HPI  80 year old patient who is seen today to establish with my practice.  He is followed closely by cardiology due to atrial fibrillation and history of systolic heart failure. He has chronic lower extremity edema that has been stable.  He is followed by ophthalmology due to macular degeneration.  The left eye worse than the right.  He has a history of impaired glucose tolerance.  More recently has been placed on potassium supplementation due to a mild hypokalemia.  He has issues with chronic constipation. His cardiac status has been stable. He has considerable osteoarthritis and is anticipating a left total knee replacement soon.  Past Medical History  Diagnosis Date  . Obesity   . Hypertension   . Edema     Bilateral - left lower leg greater than right- wears compression stockings  . BPH (benign prostatic hypertrophy)   . Asthma   . Bronchospasm, exercise-induced   . CHF (congestive heart failure)     systolic, EF 40% (07/07/2012)  . GERD (gastroesophageal reflux disease)   . A-fib   . Shortness of breath   . Dyslipidemia   . Arthritis     knees  . Chronic kidney disease     stabilized, due to infection  . Macular degeneration 08-06-13    bilateral -sight impaired    History   Social History  . Marital Status: Widowed    Spouse Name: N/A    Number of Children: N/A  . Years of Education: N/A   Occupational History  . retired    Social History Main Topics  . Smoking status: Former Smoker    Quit date: 05/17/1964  . Smokeless tobacco: Never Used  . Alcohol Use: No  . Drug Use: No  . Sexual Activity: Not Currently   Other Topics Concern  . Not on file   Social History Narrative  . No narrative on file    Past Surgical History  Procedure Laterality Date  . Vascular surgery  removed varicose veins  . Vasectomy    . Cardioversion N/A 07/07/2012    Procedure:  CARDIOVERSION;  Surgeon: Chrystie Nose, MD;  Location: [facility] ENDOSCOPY;  Service: Cardiovascular;  Laterality: N/A;  . Cardioversion N/A 08/30/2012    Procedure: CARDIOVERSION;  Surgeon: Chrystie Nose, MD;  Location: [facility] ENDOSCOPY;  Service: Cardiovascular;  Laterality: N/A;  . Transthoracic echocardiogram  07/07/2012    ef 40%; mild MR; LA mod-severely dilated; RA mildly dilated; RV systolic function mildly reduced; RV systolic pressure increase - mod pulm htn  . Total hip arthroplasty Left 08/13/2013    Procedure: LEFT TOTAL HIP ARTHROPLASTY ANTERIOR APPROACH;  Surgeon: [name], MD;  Location: [facility] ORS;  Service: Orthopedics;  Laterality: Left;    Family History  Problem Relation Age of Onset  . Alzheimer's disease Mother   . Heart disease Father     Allergies  Allergen Reactions  . Nsaids     Stomach pain  . Other     Opiates cause tightness in chest  . Vicodin [Hydrocodone-Acetaminophen] Other (See Comments)    Unknown reaction many years ago  . Morphine And Related Nausea And Vomiting    Dizziness, light headed     Current Outpatient Prescriptions on File Prior to Visit  Medication Sig Dispense Refill  . acetaminophen (TYLENOL) 500 MG tablet Take 500 mg by mouth 3 (three) times  daily. Takes with Tramadol      . Aclidinium Bromide (TUDORZA PRESSAIR) 400 MCG/ACT AEPB Inhale 1 puff into the lungs daily.      [name]. albuterol (PROVENTIL HFA;VENTOLIN HFA) 108 (90 BASE) MCG/ACT inhaler Inhale 1 puff into the lungs every 6 (six) hours as needed for wheezing or shortness of breath.      [name]. amiodarone (PACERONE) 200 MG tablet Take 200 mg by mouth every morning.      . bumetanide (BUMEX) 1 MG tablet Take 1 mg by mouth 2 (two) times daily. ONE HALF TABLET DAILY, EXCEPT WED AND SAT ONE TABLET IN AM AND HALF TABLET IN AFTERNOON      . Calcium Carbonate-Vitamin D (CALCIUM 600+D) 600-400 MG-UNIT per tablet Take 1 tablet by mouth 3 (three) times a week. Monday Wednesday and Friday      .  cetirizine (ZYRTEC) 10 MG tablet Take 10 mg by mouth daily as needed for allergies.      [name]. docusate sodium 100 MG CAPS Take 100 mg by mouth 2 (two) times daily.  10 capsule  0  . ferrous sulfate 325 (65 FE) MG tablet Take 1 tablet (325 mg total) by mouth 3 (three) times daily after meals.    3  . gabapentin (NEURONTIN) 800 MG tablet Take 400 mg by mouth 3 (three) times daily.       [name]. KLOR-CON M20 20 MEQ tablet TAKE ONE TABLET BY MOUTH TWICE DAILY  60 tablet  1  . Magnesium 250 MG TABS Take 250 mg by mouth daily.       . magnesium hydroxide (MILK OF MAGNESIA) 400 MG/5ML suspension Take 30 mLs by mouth daily as needed for mild constipation.      . Multiple Vitamin (MULTIVITAMIN WITH MINERALS) TABS tablet Take 1 tablet by mouth daily.      [name]. omeprazole (PRILOSEC) 20 MG capsule TAKE ONE CAPSULE BY MOUTH ONCE DAILY  90 capsule  1  . polyethylene glycol (MIRALAX / GLYCOLAX) packet Take 17 g by mouth 2 (two) times daily.  14 each  0  . rosuvastatin (CRESTOR) 20 MG tablet Take 20 mg by mouth once a week. Friday      . senna (SENOKOT) 8.6 MG TABS tablet Take 1 tablet by mouth daily as needed for mild constipation.      . tamsulosin (FLOMAX) 0.4 MG CAPS capsule Take 0.4 mg by mouth daily after supper.      . traMADol (ULTRAM) 50 MG tablet Take 1-2 tablets (50-100 mg total) by mouth every 6 (six) hours.  100 tablet  0  . XARELTO 20 MG TABS tablet TAKE ONE TABLET BY MOUTH ONCE DAILY  30 tablet  0  . zinc oxide (BALMEX) 11.3 % CREA cream Apply 1 application topically daily. Applied to rash that is in between legs and buttocks area      . zolpidem (AMBIEN) 5 MG tablet Take 1 tablet (5 mg total) by mouth at bedtime as needed for sleep.  30 tablet  5   No current facility-administered medications on file prior to visit.    BP 118/60  Pulse 62  Temp(Src) 98.3 F (36.8 C) (Oral)  Resp 20  Ht 5' 5.5" (1.664 m)  Wt 209 lb (94.802 kg)  BMI 34.24 kg/m2  SpO2 96%       Review of Systems    Constitutional: Negative for fever, chills, appetite change and fatigue.  HENT: Negative for congestion, dental problem, ear pain, hearing loss, sore throat,  tinnitus, trouble swallowing and voice change.   Eyes: Negative for pain, discharge and visual disturbance.  Respiratory: Negative for cough, chest tightness, wheezing and stridor.   Cardiovascular: Positive for leg swelling. Negative for chest pain and palpitations.  Gastrointestinal: Negative for nausea, vomiting, abdominal pain, diarrhea, constipation, blood in stool and abdominal distention.  Genitourinary: Negative for urgency, hematuria, flank pain, discharge, difficulty urinating and genital sores.  Musculoskeletal: Positive for arthralgias, gait problem and joint swelling. Negative for back pain, myalgias and neck stiffness.  Skin: Negative for rash.  Neurological: Positive for weakness. Negative for dizziness, syncope, speech difficulty, numbness and headaches.  Hematological: Negative for adenopathy. Does not bruise/bleed easily.  Psychiatric/Behavioral: Negative for behavioral problems and dysphoric mood. The patient is not nervous/anxious.        Objective:   Physical Exam  Constitutional: He is oriented to person, place, and time. He appears well-developed.  HENT:  Head: Normocephalic.  Right Ear: External ear normal.  Left Ear: External ear normal.  Eyes: Conjunctivae and EOM are normal.  Neck: Normal range of motion.  Cardiovascular: Normal rate and regular rhythm.   Murmur heard. Pulmonary/Chest: Breath sounds normal.  Abdominal: Bowel sounds are normal.  Musculoskeletal: Normal range of motion. He exhibits edema. He exhibits no tenderness.  Neurological: He is alert and oriented to person, place, and time.  Psychiatric: He has a normal mood and affect. His behavior is normal.          Assessment & Plan:   Hypertension well controlled Congestive heart failure, compensated  Dyslipidemia.  Continue  Crestor Atrial fibrillation.  Followup cardiology  Recheck in 6 months

## 2013-11-20 NOTE — Patient Instructions (Signed)
Limit your sodium (Salt) intake  Return in 6 months for follow-up  

## 2013-11-23 ENCOUNTER — Telehealth: Payer: Self-pay | Admitting: Internal Medicine

## 2013-11-23 NOTE — Telephone Encounter (Signed)
Pt request XARELTO 20 MG TABS tablet  30 day  [facility] [address] ([name] did not realize pt was out and apologizes for not letting you know the other day!!)

## 2013-11-24 ENCOUNTER — Other Ambulatory Visit: Payer: Self-pay | Admitting: Internal Medicine

## 2013-11-26 MED ORDER — RIVAROXABAN 20 MG PO TABS: ORAL_TABLET | ORAL | Status: DC

## 2013-11-26 NOTE — Telephone Encounter (Signed)
Spoke to pt's son Onalee Hua, told him Rx sent to pharmacy. Onalee Hua verbalized understanding.

## 2013-12-05 ENCOUNTER — Telehealth: Payer: Self-pay | Admitting: Internal Medicine

## 2013-12-05 MED ORDER — TRAMADOL HCL 50 MG PO TABS: 50.0000 mg | ORAL_TABLET | Freq: Four times a day (QID) | ORAL | Status: DC | PRN

## 2013-12-05 NOTE — Telephone Encounter (Signed)
Rx called in to pharmacy. 

## 2013-12-05 NOTE — Telephone Encounter (Signed)
[facility] - [place], [place] - [address] is requesting re-fill on traMADol (ULTRAM) 50 MG tablet ° °

## 2013-12-18 ENCOUNTER — Encounter: Payer: Self-pay | Admitting: Internal Medicine

## 2013-12-18 ENCOUNTER — Ambulatory Visit (INDEPENDENT_AMBULATORY_CARE_PROVIDER_SITE_OTHER): Payer: Medicare Other | Admitting: Internal Medicine

## 2013-12-18 VITALS — BP 95/52 | HR 63 | Ht 65.5 in | Wt 204.0 lb

## 2013-12-18 DIAGNOSIS — I10 Essential (primary) hypertension: Secondary | ICD-10-CM

## 2013-12-18 DIAGNOSIS — I5021 Acute systolic (congestive) heart failure: Secondary | ICD-10-CM

## 2013-12-18 DIAGNOSIS — I9589 Other hypotension: Secondary | ICD-10-CM

## 2013-12-18 DIAGNOSIS — I452 Bifascicular block: Secondary | ICD-10-CM | POA: Insufficient documentation

## 2013-12-18 DIAGNOSIS — I4891 Unspecified atrial fibrillation: Secondary | ICD-10-CM

## 2013-12-18 DIAGNOSIS — I509 Heart failure, unspecified: Secondary | ICD-10-CM

## 2013-12-18 DIAGNOSIS — E785 Hyperlipidemia, unspecified: Secondary | ICD-10-CM

## 2013-12-18 HISTORY — DX: Bifascicular block: I45.2

## 2013-12-18 MED ORDER — AMIODARONE HCL 100 MG PO TABS: 100.0000 mg | ORAL_TABLET | Freq: Every morning | ORAL | Status: DC

## 2013-12-18 MED ORDER — RIVAROXABAN 20 MG PO TABS: ORAL_TABLET | ORAL | Status: DC

## 2013-12-18 NOTE — Progress Notes (Signed)
12/18/2013 [name]   27-Feb-1927  [id number]    Primary Physicia Rogelia Boga, MD Primary Cardiologist: Dr Rennis Golden  CC: Leg edema, walking better  HPI: 80 y/o with a history of AF. We saw him in Feb 2014 and attempted cardioversion then but he failed to convert. He was put on Amiodarone and cardioverted successfully in April 2014. He has been on Amiodarone and Xarelto. He saw [name] recently with complaints of near syncopal episodes. He describes a "flushed feeling" with weakness. His HR at home is in the 50s. He has noted more of these episodes when he is trying to urinate. [name] had stopped his digoxin and lower his dose of amiodarone to 200 mg daily. Since that time his symptoms have improved significantly, however he has had some episodes of low blood pressure. At that time he was diagnosed with atrial fibrillation his EF was 40%, suspect it may be improved as it may been tachycardia related. He continues on Bumex 1 mg daily for most days of the week and 2 or 3 days a week takes it twice daily. This may however be too much diuretic for him.  He also wore a cardia monitor between 12/06/2012 at 12/19/2012. During the 315 hours of monitoring, there was only sinus rhythm and a short episode of sinus bradycardia at 59 with a first degree AV block and intraventricular conduction delay. No pauses or atrial fibrillation were noted.  [name] returns today and is feeling quite well. He continues to have labile hypotension and is not on any blood pressure medications. I would not add any medications due to the fact that in general his blood pressure since around 100 systolic. He does have higher blood pressures in the morning.  He recently underwent total hip arthroplasty by Dr. Charlann Boxer and has rehabilitated over the past several months. He reports an improvement in his ambulation. He is weight is stable in volume status appears stable. He did have edema after surgery which is improved. He  continues to have an underlying right bundle branch block but is maintaining sinus rhythm on amiodarone. He is intraventricular conduction delay is worse today however at 184 ms for the QRS. This suggests a significant intraventricular conduction delay.  The only offending medication could be amiodarone.   Current Outpatient Prescriptions  Medication Sig Dispense Refill  . acetaminophen (TYLENOL) 500 MG tablet Take 500 mg by mouth 3 (three) times daily. Takes with Tramadol      . albuterol (PROVENTIL HFA;VENTOLIN HFA) 108 (90 BASE) MCG/ACT inhaler Inhale 1 puff into the lungs every 6 (six) hours as needed for wheezing or shortness of breath.      [name] amiodarone (PACERONE) 100 MG tablet Take 1 tablet (100 mg total) by mouth every morning.  30 tablet  6  . bumetanide (BUMEX) 1 MG tablet Take 1 mg by mouth 2 (two) times daily. ONE HALF TABLET DAILY, EXCEPT WED AND SAT ONE TABLET IN AM AND HALF TABLET IN AFTERNOON      . Calcium Carbonate-Vitamin D (CALCIUM 600+D) 600-400 MG-UNIT per tablet Take 1 tablet by mouth 3 (three) times a week. Monday Wednesday and Friday      . cetirizine (ZYRTEC) 10 MG tablet Take 10 mg by mouth daily as needed for allergies.      [name] docusate sodium 100 MG CAPS Take 100 mg by mouth 2 (two) times daily.  10 capsule  0  . gabapentin (NEURONTIN) 800 MG tablet Take 400 mg by mouth 3 (  three) times daily.       . Magnesium 250 MG TABS Take 250 mg by mouth daily.       . magnesium hydroxide (MILK OF MAGNESIA) 400 MG/5ML suspension Take 30 mLs by mouth daily as needed for mild constipation.      . Multiple Vitamin (MULTIVITAMIN WITH MINERALS) TABS tablet Take 1 tablet by mouth daily.      . Multiple Vitamins-Minerals (ICAPS AREDS FORMULA PO) Take 2 capsules by mouth 2 (two) times daily.      [name]. omeprazole (PRILOSEC) 20 MG capsule TAKE ONE CAPSULE BY MOUTH ONCE DAILY  90 capsule  1  . polyethylene glycol (MIRALAX / GLYCOLAX) packet Take 17 g by mouth 2 (two) times daily.  14 each  0  .  potassium chloride SA (KLOR-CON M20) 20 MEQ tablet Take 1 tablet (20 mEq total) by mouth daily.  60 tablet  1  . rivaroxaban (XARELTO) 20 MG TABS tablet TAKE ONE TABLET BY MOUTH ONCE DAILY  25 tablet  0  . rosuvastatin (CRESTOR) 20 MG tablet Take 20 mg by mouth once a week. Friday      . senna (SENOKOT) 8.6 MG TABS tablet Take 1 tablet by mouth daily as needed for mild constipation.      . tamsulosin (FLOMAX) 0.4 MG CAPS capsule Take 0.4 mg by mouth daily after supper.      . tiotropium (SPIRIVA) 18 MCG inhalation capsule Place 1 capsule (18 mcg total) into inhaler and inhale daily.  30 capsule  5  . traMADol (ULTRAM) 50 MG tablet Take 1-2 tablets (50-100 mg total) by mouth every 6 (six) hours as needed.  100 tablet  2  . zinc oxide (BALMEX) 11.3 % CREA cream Apply 1 application topically daily as needed. Applied to rash that is in between legs and buttocks area      . zolpidem (AMBIEN) 5 MG tablet Take 1 tablet (5 mg total) by mouth at bedtime as needed for sleep.  30 tablet  5   No current facility-administered medications for this visit.    Allergies  Allergen Reactions  . Nsaids     Stomach pain  . Other     Opiates cause tightness in chest  . Vicodin [Hydrocodone-Acetaminophen] Other (See Comments)    Unknown reaction many years ago  . Morphine And Related Nausea And Vomiting    Dizziness, light headed     History   Social History  . Marital Status: Widowed    Spouse Name: N/A    Number of Children: N/A  . Years of Education: N/A   Occupational History  . retired    Social History Main Topics  . Smoking status: Former Smoker    Quit date: 05/17/1964  . Smokeless tobacco: Never Used  . Alcohol Use: No  . Drug Use: No  . Sexual Activity: Not Currently   Other Topics Concern  . Not on file   Social History Narrative  . No narrative on file     Review of Systems: General: negative for chills, fever, night sweats or weight changes.  Cardiovascular: negative for  chest pain, dyspnea on exertion, edema, orthopnea, palpitations, paroxysmal nocturnal dyspnea or shortness of breath Dermatological: negative for rash Respiratory: negative for cough or wheezing Urologic: negative for hematuria Abdominal: negative for nausea, vomiting, diarrhea, bright red blood per rectum, melena, or hematemesis Neurologic: negative for visual changes, syncope, or dizziness All other systems reviewed and are otherwise negative except as noted above.  Blood pressure 95/52, pulse 63, height 5' 5.5" (1.664 m), weight 204 lb (92.534 kg).  General appearance: alert, no distress and pale Neck: no carotid bruit and no JVD Lungs: clear to auscultation bilaterally Heart: regular rate and rhythm Abdomen: soft, non-tender; bowel sounds normal; no masses,  no organomegaly Extremities: edema trace Pulses: 2+ and symmetric Skin: Skin color, texture, turgor normal. No rashes or lesions Neurologic: Mental status: Alert, oriented, thought content appropriate, in wheelchair psych: Mood, affect normal  EKG  Normal sinus at 63, Bifascicular block, QRS duration 184 msec  ASSESSMENT AND PLAN:   Patient Active Problem List   Diagnosis Date Noted  . Bifascicular block 12/18/2013  . Leg edema 10/25/2013  . CHF (congestive heart failure) 08/30/2013  . S/P left THA, AA 08/13/2013  . Chronic anticoagulation 12/06/2012  . Near syncope 12/06/2012  . Lower extremity edema 07/05/2012  . Acute systolic CHF (congestive heart failure), NYHA class 3 -- Unclear etilogy (? Afib related) EF down from 60-70% to 40%. 07/05/2012  . General weakness 07/05/2012  . Paroxysmal Atrial fibrillation - admitted with RVR 01/18/2012  . DJD (degenerative joint disease), lumbar 01/18/2012  . Hx of colonic polyps 11/02/2011  . BENIGN PROSTATIC HYPERTROPHY 05/13/2010  . SEBORRHEA CAPITIS 02/13/2010  . History of elevated glucose 10/01/2009  . CHRONIC RHINITIS 06/02/2009  . HYPERLIPIDEMIA 11/01/2008  .  NASAL POLYP 07/03/2008  . INSOMNIA, CHRONIC, MILD 01/02/2008  . HYPERTENSION 02/06/2007  . ASTHMA 02/06/2007    PLAN   [name] is doing better and maintaining sinus rhythm on low dose amiodarone. I would recommend decreasing his amiodarone to 100 mg daily. We'll need to continue to watch for signs or symptoms related to worsening intraventricular conduction delay. His lites status appears stable. He is having no bleeding problems on Xarelto and is requesting repeat samples today.  He seems to be doing well after his hip surgery. I've encouraged ambulation and will plan to see him back in 6 months.  Chrystie Nose, MD, [facility] Attending Cardiologist [facility]   [name] 12/18/2013 9:41 AM

## 2013-12-18 NOTE — Patient Instructions (Signed)
Your physician has recommended you make the following change in your medication: DECREASE amiodarone to 100mg  once daily.   Your physician wants you to follow-up in: 6 months. You will receive a reminder letter in the mail two months in advance. If you don't receive a letter, please call our office to schedule the follow-up appointment.

## 2014-01-16 ENCOUNTER — Telehealth: Payer: Self-pay | Admitting: Internal Medicine

## 2014-01-16 ENCOUNTER — Other Ambulatory Visit: Payer: Self-pay | Admitting: Internal Medicine

## 2014-01-16 NOTE — Telephone Encounter (Signed)
Decrease to once daily at bedtime for 2 weeks and then discontinue.  If stable

## 2014-01-16 NOTE — Telephone Encounter (Signed)
Pt's son is inquiring about rx gabapentin (NEURONTIN) 800 MG tablet, son states that he no longer has nerve pain that much so he wants to know if it can be decreased. Pt is currently taking it 3 times daily. Pt seems to be loosing his balance and has slurred speech.  Send to [facility] [address].

## 2014-01-16 NOTE — Telephone Encounter (Signed)
Please see message and advise 

## 2014-01-16 NOTE — Telephone Encounter (Signed)
LMTCB to clarify dose 

## 2014-01-16 NOTE — Telephone Encounter (Signed)
Left message on voicemail to call office.  

## 2014-01-17 NOTE — Telephone Encounter (Signed)
Pt's son called in stating that the rx for Bumex has not yet been filled. Please call  Thanks

## 2014-01-17 NOTE — Telephone Encounter (Signed)
Left message on voicemail to call office.  

## 2014-01-18 NOTE — Telephone Encounter (Signed)
Rx refill sent to patient pharmacy   

## 2014-01-18 NOTE — Telephone Encounter (Signed)
Onalee Hua, pt;s son called back, told him okay to decrease medication Gabapentin to once daily at bedtime for 2 weeks and then discontinue as long as pt is stable. Onalee Hua verbalized understanding. Asked about pt's balance? Onalee Hua said he is doing okay, not sure if hanging on to things when walking due to vision. Asked him if he thinks pt needs Physical Therapy? Onalee Hua he will just monitor for now and call us if it becomes a problem. Told him okay.

## 2014-01-31 ENCOUNTER — Other Ambulatory Visit: Payer: Self-pay | Admitting: Internal Medicine

## 2014-02-13 ENCOUNTER — Telehealth: Payer: Self-pay | Admitting: Internal Medicine

## 2014-02-13 NOTE — Telephone Encounter (Signed)
Olegario Messier, I will not be here Friday, put him on Flu clinic schedule.

## 2014-02-13 NOTE — Telephone Encounter (Signed)
Pt would like to come in at 8:30 fri am for a flu shot.  I know dr Kirtland Bouchard isn't here, will you be here [place] and is that ok?

## 2014-02-13 NOTE — Telephone Encounter (Signed)
Ok, scheduled w/ Gerlean Ren

## 2014-02-15 ENCOUNTER — Ambulatory Visit (INDEPENDENT_AMBULATORY_CARE_PROVIDER_SITE_OTHER): Payer: Medicare Other

## 2014-02-15 ENCOUNTER — Ambulatory Visit: Payer: Medicare Other | Admitting: *Deleted

## 2014-02-15 DIAGNOSIS — Z23 Encounter for immunization: Secondary | ICD-10-CM

## 2014-03-15 ENCOUNTER — Other Ambulatory Visit: Payer: Self-pay | Admitting: Internal Medicine

## 2014-03-15 ENCOUNTER — Telehealth: Payer: Self-pay | Admitting: Internal Medicine

## 2014-03-15 MED ORDER — TRAMADOL HCL 50 MG PO TABS: 50.0000 mg | ORAL_TABLET | Freq: Four times a day (QID) | ORAL | Status: DC | PRN

## 2014-03-15 NOTE — Telephone Encounter (Signed)
Pt notified Rx called into pharmacy 

## 2014-03-15 NOTE — Telephone Encounter (Signed)
Pt request refill traMADol (ULTRAM) 50 MG tablet [facility]

## 2014-04-13 ENCOUNTER — Other Ambulatory Visit: Payer: Self-pay | Admitting: Internal Medicine

## 2014-04-15 NOTE — Telephone Encounter (Signed)
Rx was sent to pharmacy electronically. 

## 2014-05-19 ENCOUNTER — Other Ambulatory Visit: Payer: Self-pay | Admitting: Internal Medicine

## 2014-05-21 ENCOUNTER — Other Ambulatory Visit: Payer: Self-pay

## 2014-05-21 MED ORDER — OMEPRAZOLE 20 MG PO CPDR: 20.0000 mg | DELAYED_RELEASE_CAPSULE | Freq: Every day | ORAL | Status: DC

## 2014-05-21 NOTE — Telephone Encounter (Signed)
Rx request for omeprazole 20 mg capsule- Take 1 capsule by mouth once daily #90  Pharm:  [facility]  Rx sent to pharmacy.

## 2014-05-23 ENCOUNTER — Encounter: Payer: Self-pay | Admitting: Internal Medicine

## 2014-05-23 ENCOUNTER — Encounter: Payer: Self-pay | Admitting: *Deleted

## 2014-05-23 ENCOUNTER — Ambulatory Visit (INDEPENDENT_AMBULATORY_CARE_PROVIDER_SITE_OTHER): Payer: Medicare Other | Admitting: Internal Medicine

## 2014-05-23 VITALS — BP 126/70 | HR 68 | Temp 97.5°F | Resp 20 | Ht 65.5 in | Wt 212.0 lb

## 2014-05-23 DIAGNOSIS — R600 Localized edema: Secondary | ICD-10-CM

## 2014-05-23 DIAGNOSIS — I5021 Acute systolic (congestive) heart failure: Secondary | ICD-10-CM

## 2014-05-23 DIAGNOSIS — I10 Essential (primary) hypertension: Secondary | ICD-10-CM

## 2014-05-23 DIAGNOSIS — Z7901 Long term (current) use of anticoagulants: Secondary | ICD-10-CM

## 2014-05-23 LAB — COMPREHENSIVE METABOLIC PANEL
ALT: 11 U/L (ref 0–53)
AST: 18 U/L (ref 0–37)
Albumin: 3.7 g/dL (ref 3.5–5.2)
Alkaline Phosphatase: 73 U/L (ref 39–117)
BUN: 16 mg/dL (ref 6–23)
CHLORIDE: 101 meq/L (ref 96–112)
CO2: 30 mEq/L (ref 19–32)
CREATININE: 0.8 mg/dL (ref 0.4–1.5)
Calcium: 9 mg/dL (ref 8.4–10.5)
GFR: 96.24 mL/min (ref >60.00)
Glucose, Bld: 117 mg/dL — ABNORMAL HIGH (ref 70–99)
Potassium: 4.1 mEq/L (ref 3.5–5.1)
SODIUM: 136 meq/L (ref 135–145)
Total Bilirubin: 0.5 mg/dL (ref 0.2–1.2)
Total Protein: 6.6 g/dL (ref 6.0–8.3)

## 2014-05-23 LAB — CBC WITH DIFFERENTIAL/PLATELET
BASOS PCT: 0.6 % (ref 0.0–3.0)
Basophils Absolute: 0 10*3/uL (ref 0.0–0.1)
EOS PCT: 2.4 % (ref 0.0–5.0)
Eosinophils Absolute: 0.1 10*3/uL (ref 0.0–0.7)
HEMATOCRIT: 35 % — AB (ref 39.0–52.0)
Hemoglobin: 11.3 g/dL — ABNORMAL LOW (ref 13.0–17.0)
LYMPHS PCT: 34.2 % (ref 12.0–46.0)
Lymphs Abs: 1.9 10*3/uL (ref 0.7–4.0)
MCHC: 32.3 g/dL (ref 30.0–36.0)
MCV: 94.9 fl (ref 78.0–100.0)
MONOS PCT: 12.6 % — AB (ref 3.0–12.0)
Monocytes Absolute: 0.7 10*3/uL (ref 0.1–1.0)
NEUTROS ABS: 2.8 10*3/uL (ref 1.4–7.7)
NEUTROS PCT: 50.2 % (ref 43.0–77.0)
Platelets: 228 10*3/uL (ref 150.0–400.0)
RBC: 3.69 Mil/uL — ABNORMAL LOW (ref 4.22–5.81)
RDW: 15.3 % (ref 11.5–15.5)
WBC: 5.6 10*3/uL (ref 4.0–10.5)

## 2014-05-23 NOTE — Progress Notes (Signed)
Pre visit review using our clinic review tool, if applicable. No additional management support is needed unless otherwise documented below in the visit note. 

## 2014-05-23 NOTE — Patient Instructions (Signed)
Limit your sodium (Salt) intake  Return in 6 months for follow-up  

## 2014-05-23 NOTE — Progress Notes (Signed)
Subjective:    Patient ID: [name], male    DOB: 11/19/1926, 81 y.o.   MRN: [id number]  HPI  81 year old patient who is seen today for his biannual follow-up.  He is followed by cardiology.  He has a history of chronic systolic heart failure and lower extremity edema.  He has done quite well.  He is scheduled to see cardiology next month.  Remains on chronic anticoagulation.  He does have a history of paroxysmal atrial fibrillation.  He has essential hypertension and mild dyslipidemia.  He has osteoarthritis.  In general has done quite well over the past 6 months and has been pleased with his progress  Past Medical History  Diagnosis Date  . Obesity   . Hypertension   . Edema     Bilateral - left lower leg greater than right- wears compression stockings  . BPH (benign prostatic hypertrophy)   . Asthma   . Bronchospasm, exercise-induced   . CHF (congestive heart failure)     systolic, EF 40% (07/07/2012)  . GERD (gastroesophageal reflux disease)   . A-fib   . Shortness of breath   . Dyslipidemia   . Arthritis     knees  . Chronic kidney disease     stabilized, due to infection  . Macular degeneration 08-06-13    bilateral -sight impaired    History   Social History  . Marital Status: Widowed    Spouse Name: N/A    Number of Children: N/A  . Years of Education: N/A   Occupational History  . retired    Social History Main Topics  . Smoking status: Former Smoker    Quit date: 05/17/1964  . Smokeless tobacco: Never Used  . Alcohol Use: No  . Drug Use: No  . Sexual Activity: Not Currently   Other Topics Concern  . Not on file   Social History Narrative    Past Surgical History  Procedure Laterality Date  . Vascular surgery  removed varicose veins  . Vasectomy    . Cardioversion N/A 07/07/2012    Procedure: CARDIOVERSION;  Surgeon: Chrystie Nose, MD;  Location: [facility] ENDOSCOPY;  Service: Cardiovascular;  Laterality: N/A;  . Cardioversion N/A 08/30/2012   Procedure: CARDIOVERSION;  Surgeon: Chrystie Nose, MD;  Location: [facility] ENDOSCOPY;  Service: Cardiovascular;  Laterality: N/A;  . Transthoracic echocardiogram  07/07/2012    ef 40%; mild MR; LA mod-severely dilated; RA mildly dilated; RV systolic function mildly reduced; RV systolic pressure increase - mod pulm htn  . Total hip arthroplasty Left 08/13/2013    Procedure: LEFT TOTAL HIP ARTHROPLASTY ANTERIOR APPROACH;  Surgeon: [name], MD;  Location: [facility] ORS;  Service: Orthopedics;  Laterality: Left;    Family History  Problem Relation Age of Onset  . Alzheimer's disease Mother   . Heart disease Father     Allergies  Allergen Reactions  . Nsaids     Stomach pain  . Other     Opiates cause tightness in chest  . Vicodin [Hydrocodone-Acetaminophen] Other (See Comments)    Unknown reaction many years ago  . Morphine And Related Nausea And Vomiting    Dizziness, light headed     Current Outpatient Prescriptions on File Prior to Visit  Medication Sig Dispense Refill  . acetaminophen (TYLENOL) 500 MG tablet Take 500 mg by mouth 3 (three) times daily. Takes with Tramadol    . albuterol (PROVENTIL HFA;VENTOLIN HFA) 108 (90 BASE) MCG/ACT inhaler Inhale 1 puff into the  lungs every 6 (six) hours as needed for wheezing or shortness of breath.    [name] amiodarone (PACERONE) 100 MG tablet Take 1 tablet (100 mg total) by mouth every morning. 30 tablet 6  . bumetanide (BUMEX) 1 MG tablet TAKE ONE-HALF TABLET BY MOUTH TWICE DAILY EXCEPT WED AND SAT TAKE 1 AND 1/2 TABLETS 35 tablet 5  . Calcium Carbonate-Vitamin D (CALCIUM 600+D) 600-400 MG-UNIT per tablet Take 1 tablet by mouth 3 (three) times a week. Monday Wednesday and Friday    . cetirizine (ZYRTEC) 10 MG tablet Take 10 mg by mouth daily as needed for allergies.    [name] docusate sodium 100 MG CAPS Take 100 mg by mouth 2 (two) times daily. 10 capsule 0  . gabapentin (NEURONTIN) 800 MG tablet Take 400 mg by mouth 3 (three) times daily.     . Magnesium  250 MG TABS Take 250 mg by mouth daily.     . magnesium hydroxide (MILK OF MAGNESIA) 400 MG/5ML suspension Take 30 mLs by mouth daily as needed for mild constipation.    . Multiple Vitamin (MULTIVITAMIN WITH MINERALS) TABS tablet Take 1 tablet by mouth daily.    . Multiple Vitamins-Minerals (ICAPS AREDS FORMULA PO) Take 2 capsules by mouth 2 (two) times daily.    . polyethylene glycol (MIRALAX / GLYCOLAX) packet Take 17 g by mouth 2 (two) times daily. 14 each 0  . potassium chloride SA (KLOR-CON M20) 20 MEQ tablet Take 1 tablet (20 mEq total) by mouth daily. 60 tablet 1  . rivaroxaban (XARELTO) 20 MG TABS tablet TAKE ONE TABLET BY MOUTH ONCE DAILY 25 tablet 0  . rosuvastatin (CRESTOR) 20 MG tablet Take 20 mg by mouth once a week. Friday    . senna (SENOKOT) 8.6 MG TABS tablet Take 1 tablet by mouth daily as needed for mild constipation.    [name] SPIRIVA HANDIHALER 18 MCG inhalation capsule PLACE 1 CAPSULE INTO INHALER AND INHALE DAILY 30 capsule 5  . tamsulosin (FLOMAX) 0.4 MG CAPS capsule TAKE ONE CAPSULE BY MOUTH ONCE DAILY 30 capsule 5  . traMADol (ULTRAM) 50 MG tablet Take 1-2 tablets (50-100 mg total) by mouth every 6 (six) hours as needed. 100 tablet 2  . zinc oxide (BALMEX) 11.3 % CREA cream Apply 1 application topically daily as needed. Applied to rash that is in between legs and buttocks area    . zolpidem (AMBIEN) 5 MG tablet Take 1 tablet (5 mg total) by mouth at bedtime as needed for sleep. 30 tablet 5  . omeprazole (PRILOSEC) 20 MG capsule Take 1 capsule (20 mg total) by mouth daily. (Patient not taking: Reported on 05/23/2014) 90 capsule 1   No current facility-administered medications on file prior to visit.    BP 126/70 mmHg  Pulse 68  Temp(Src) 97.5 F (36.4 C) (Oral)  Resp 20  Ht 5' 5.5" (1.664 m)  Wt 212 lb (96.163 kg)  BMI 34.73 kg/m2  SpO2 98%     Review of Systems  Constitutional: Negative for fever, chills, appetite change and fatigue.  HENT: Negative for  congestion, dental problem, ear pain, hearing loss, sore throat, tinnitus, trouble swallowing and voice change.   Eyes: Negative for pain, discharge and visual disturbance.  Respiratory: Negative for cough, chest tightness, wheezing and stridor.   Cardiovascular: Positive for leg swelling. Negative for chest pain and palpitations.  Gastrointestinal: Negative for nausea, vomiting, abdominal pain, diarrhea, constipation, blood in stool and abdominal distention.  Genitourinary: Negative for urgency, hematuria, flank  pain, discharge, difficulty urinating and genital sores.  Musculoskeletal: Negative for myalgias, back pain, joint swelling, arthralgias, gait problem and neck stiffness.  Skin: Negative for rash.  Neurological: Negative for dizziness, syncope, speech difficulty, weakness, numbness and headaches.  Hematological: Negative for adenopathy. Does not bruise/bleed easily.  Psychiatric/Behavioral: Negative for behavioral problems and dysphoric mood. The patient is not nervous/anxious.        Objective:   Physical Exam  Constitutional: He is oriented to person, place, and time. He appears well-developed.  HENT:  Head: Normocephalic.  Right Ear: External ear normal.  Left Ear: External ear normal.  Eyes: Conjunctivae and EOM are normal.  Neck: Normal range of motion.  Cardiovascular: Normal rate and normal heart sounds.   Pulmonary/Chest: Breath sounds normal.  Abdominal: Bowel sounds are normal.  Musculoskeletal: Normal range of motion. He exhibits edema. He exhibits no tenderness.  Neurological: He is alert and oriented to person, place, and time.  Psychiatric: He has a normal mood and affect. His behavior is normal.          Assessment & Plan:   Congestive heart failure.  Compensated Essential hypertension, well-controlled Paroxysmal atrial fibrillation.  Remains in normal sinus rhythm Osteoarthritis  Lab update Recheck 6 months

## 2014-05-24 ENCOUNTER — Telehealth: Payer: Self-pay | Admitting: Internal Medicine

## 2014-05-24 NOTE — Telephone Encounter (Signed)
emmi mailed  °

## 2014-06-13 ENCOUNTER — Other Ambulatory Visit: Payer: Self-pay | Admitting: Internal Medicine

## 2014-06-17 ENCOUNTER — Other Ambulatory Visit: Payer: Self-pay | Admitting: Internal Medicine

## 2014-06-25 ENCOUNTER — Ambulatory Visit: Payer: Medicare Other | Admitting: Internal Medicine

## 2014-07-16 ENCOUNTER — Other Ambulatory Visit: Payer: Self-pay | Admitting: Internal Medicine

## 2014-07-16 NOTE — Telephone Encounter (Signed)
Rx refill sent to patient pharmacy   

## 2014-07-19 ENCOUNTER — Encounter: Payer: Self-pay | Admitting: Internal Medicine

## 2014-07-19 ENCOUNTER — Ambulatory Visit (INDEPENDENT_AMBULATORY_CARE_PROVIDER_SITE_OTHER): Payer: Medicare Other | Admitting: Internal Medicine

## 2014-07-19 VITALS — BP 102/60 | HR 66 | Ht 69.0 in | Wt 210.2 lb

## 2014-07-19 DIAGNOSIS — I5022 Chronic systolic (congestive) heart failure: Secondary | ICD-10-CM

## 2014-07-19 DIAGNOSIS — I4891 Unspecified atrial fibrillation: Secondary | ICD-10-CM

## 2014-07-19 DIAGNOSIS — I10 Essential (primary) hypertension: Secondary | ICD-10-CM

## 2014-07-19 DIAGNOSIS — E785 Hyperlipidemia, unspecified: Secondary | ICD-10-CM

## 2014-07-19 NOTE — Patient Instructions (Addendum)
Your physician recommends that you return for lab work at your convenience - fasting.   Your physician recommends that you schedule a follow-up appointment in: 6 months with Dr. Rennis Golden.   xarelto 20mg  samples provided #25 LOT 15MG 922 Exp: 10/2016

## 2014-07-19 NOTE — Progress Notes (Signed)
07/19/2014 [name]   August 21, 1926  [id number]    Primary Physicia [name] Boga, MD Primary Cardiologist: Dr [name]  CC: Leg edema, walking better  HPI: 81 y/o with a history of AF. We saw him in Feb 2014 and attempted cardioversion then but he failed to convert. He was put on Amiodarone and cardioverted successfully in April 2014. He has been on Amiodarone and Xarelto. He saw [name] recently with complaints of near syncopal episodes. He describes a "flushed feeling" with weakness. His HR at home is in the 50s. He has noted more of these episodes when he is trying to urinate. [name] had stopped his digoxin and lower his dose of amiodarone to 200 mg daily. Since that time his symptoms have improved significantly, however he has had some episodes of low blood pressure. At that time he was diagnosed with atrial fibrillation his EF was 40%, suspect it may be improved as it may been tachycardia related. He continues on Bumex 1 mg daily for most days of the week and 2 or 3 days a week takes it twice daily. This may however be too much diuretic for him.  He also wore a cardia monitor between 12/06/2012 at 12/19/2012. During the 315 hours of monitoring, there was only sinus rhythm and a short episode of sinus bradycardia at 59 with a first degree AV block and intraventricular conduction delay. No pauses or atrial fibrillation were noted.  [name] returns today and is feeling quite well. He continues to have labile hypotension and is not on any blood pressure medications. I would not add any medications due to the fact that in general his blood pressure since around 100 systolic. He does have higher blood pressures in the morning.  He recently underwent total hip arthroplasty by Dr. Charlann [name] and has rehabilitated over the past several months. He reports an improvement in his ambulation. He is weight is stable in volume status appears stable. He did have edema after surgery which is improved. He  continues to have an underlying right bundle branch block but is maintaining sinus rhythm on amiodarone. He is intraventricular conduction delay is worse today however at 184 ms for the QRS. This suggests a significant intraventricular conduction delay.  The only offending medication could be amiodarone.   [name] returns today for follow-up. He is having no new complaints. He is more active. He denies any chest pain or shortness of breath. He continues to maintain sinus rhythm. There's been no change in intraventricular conduction delay.  Current Outpatient Prescriptions  Medication Sig Dispense Refill  . acetaminophen (TYLENOL) 500 MG tablet Take 500 mg by mouth 3 (three) times daily. Takes with Tramadol    . albuterol (PROVENTIL HFA;VENTOLIN HFA) 108 (90 BASE) MCG/ACT inhaler Inhale 1 puff into the lungs every 6 (six) hours as needed for wheezing or shortness of breath.    [name] amiodarone (PACERONE) 100 MG tablet Take 1 tablet (100 mg total) by mouth every morning. 30 tablet 6  . bumetanide (BUMEX) 1 MG tablet TAKE ONE-HALF TABLET BY MOUTH TWICE DAILY EXCEPT  WED. AND SAT.  TAKE ONE AND ONE-HALF TABLETS 35 tablet 5  . Calcium Carbonate-Vitamin D (CALCIUM 600+D) 600-400 MG-UNIT per tablet Take 1 tablet by mouth 3 (three) times a week. Monday Wednesday and Friday    . cetirizine (ZYRTEC) 10 MG tablet Take 10 mg by mouth daily as needed for allergies.    [name] docusate sodium 100 MG CAPS Take 100 mg by mouth 2 (  two) times daily. 10 capsule 0  . fluocinonide (LIDEX) 0.05 % external solution Apply 1 application topically 2 (two) times daily.   1  . gabapentin (NEURONTIN) 800 MG tablet Take 400 mg by mouth 3 (three) times daily.     [name] KLOR-CON M20 20 MEQ tablet TAKE ONE TABLET BY MOUTH ONCE DAILY 60 tablet 3  . Magnesium 250 MG TABS Take 250 mg by mouth daily.     . magnesium hydroxide (MILK OF MAGNESIA) 400 MG/5ML suspension Take 30 mLs by mouth daily as needed for mild constipation.    . Multiple Vitamin  (MULTIVITAMIN WITH MINERALS) TABS tablet Take 1 tablet by mouth daily.    . Multiple Vitamins-Minerals (ICAPS AREDS FORMULA PO) Take 2 capsules by mouth 2 (two) times daily.    [name] omeprazole (PRILOSEC) 20 MG capsule Take 1 capsule (20 mg total) by mouth daily. 90 capsule 1  . polyethylene glycol (MIRALAX / GLYCOLAX) packet Take 17 g by mouth 2 (two) times daily. 14 each 0  . rosuvastatin (CRESTOR) 20 MG tablet Take 20 mg by mouth once a week. Friday    . senna (SENOKOT) 8.6 MG TABS tablet Take 1 tablet by mouth daily as needed for mild constipation.    [name] SPIRIVA HANDIHALER 18 MCG inhalation capsule PLACE 1 CAPSULE INTO INHALER AND INHALE DAILY 30 capsule 5  . tamsulosin (FLOMAX) 0.4 MG CAPS capsule TAKE ONE CAPSULE BY MOUTH ONCE DAILY 30 capsule 5  . traMADol (ULTRAM) 50 MG tablet TAKE ONE TO TWO TABLETS BY MOUTH EVERY 6 HOURS AS NEEDED FOR PAIN 100 tablet 1  . XARELTO 20 MG TABS tablet TAKE ONE TABLET BY MOUTH ONCE DAILY 30 tablet 2  . zinc oxide (BALMEX) 11.3 % CREA cream Apply 1 application topically daily as needed. Applied to rash that is in between legs and buttocks area    . zolpidem (AMBIEN) 5 MG tablet Take 1 tablet (5 mg total) by mouth at bedtime as needed for sleep. 30 tablet 5   No current facility-administered medications for this visit.    Allergies  Allergen Reactions  . Nsaids     Stomach pain  . Other     Opiates cause tightness in chest  . Vicodin [Hydrocodone-Acetaminophen] Other (See Comments)    Unknown reaction many years ago  . Morphine And Related Nausea And Vomiting    Dizziness, light headed     History   Social History  . Marital Status: Widowed    Spouse Name: N/A  . Number of Children: N/A  . Years of Education: N/A   Occupational History  . retired    Social History Main Topics  . Smoking status: Former Smoker    Quit date: 05/17/1964  . Smokeless tobacco: Never Used  . Alcohol Use: No  . Drug Use: No  . Sexual Activity: Not Currently    Other Topics Concern  . Not on file   Social History Narrative     Review of Systems: General: negative for chills, fever, night sweats or weight changes.  Cardiovascular: negative for chest pain, dyspnea on exertion, edema, orthopnea, palpitations, paroxysmal nocturnal dyspnea or shortness of breath Dermatological: negative for rash Respiratory: negative for cough or wheezing Urologic: negative for hematuria Abdominal: negative for nausea, vomiting, diarrhea, bright red blood per rectum, melena, or hematemesis Neurologic: negative for visual changes, syncope, or dizziness All other systems reviewed and are otherwise negative except as noted above.    Blood pressure 102/60, pulse 66, height   (1.753 m), weight 210 lb 3.2 oz (95.346 kg).  General appearance: alert, no distress and pale Neck: no carotid bruit and no JVD Lungs: clear to auscultation bilaterally Heart: regular rate and rhythm Abdomen: soft, non-tender; bowel sounds normal; no masses,  no organomegaly Extremities: edema trace Pulses: 2+ and symmetric Skin: Skin color, texture, turgor normal. No rashes or lesions Neurologic: Mental status: Alert, oriented, thought content appropriate, in wheelchair psych: Mood, affect normal  EKG  Normal sinus at 63, Bifascicular block, QRS duration 134 msec  ASSESSMENT AND PLAN:   Patient Active Problem List   Diagnosis Date Noted  . Bifascicular block 12/18/2013  . Leg edema 10/25/2013  . CHF (congestive heart failure) 08/30/2013  . S/P left THA, AA 08/13/2013  . Chronic anticoagulation 12/06/2012  . Near syncope 12/06/2012  . Lower extremity edema 07/05/2012  . Acute systolic CHF (congestive heart failure), NYHA class 3 -- Unclear etilogy (? Afib related) EF down from 60-70% to 40%. 07/05/2012  . General weakness 07/05/2012  . Paroxysmal Atrial fibrillation - admitted with RVR 01/18/2012  . DJD (degenerative joint disease), lumbar 01/18/2012  . Hx of colonic  polyps 11/02/2011  . BENIGN PROSTATIC HYPERTROPHY 05/13/2010  . SEBORRHEA CAPITIS 02/13/2010  . History of elevated glucose 10/01/2009  . CHRONIC RHINITIS 06/02/2009  . Hyperlipidemia 11/01/2008  . NASAL POLYP 07/03/2008  . INSOMNIA, CHRONIC, MILD 01/02/2008  . Essential hypertension 02/06/2007  . ASTHMA 02/06/2007    PLAN   [name] is doing better and maintaining sinus rhythm on low dose amiodarone. He is having no bleeding problems on Xarelto and is requesting repeat samples today.  He seems to be doing well after his hip surgery. I've encouraged ambulation and will plan to see him back in 6 months.  Chrystie Nose, MD, [facility] Attending Cardiologist [facility]   Niyati Heinke C 07/19/2014 10:46 AM

## 2014-07-23 ENCOUNTER — Telehealth: Payer: Self-pay

## 2014-07-23 NOTE — Telephone Encounter (Signed)
According to his medication list, he should be taking 400 mg 3 times daily

## 2014-07-23 NOTE — Telephone Encounter (Signed)
Rx request for gabaentin 400 mg capsules-Take 2 capsules by mouth three times daily #540  Please advise if pt should be on 400 mg capsules or 800 mg tablets with the same instructions.

## 2014-07-24 MED ORDER — GABAPENTIN 400 MG PO CAPS: 400.0000 mg | ORAL_CAPSULE | Freq: Three times a day (TID) | ORAL | Status: DC

## 2014-07-24 NOTE — Telephone Encounter (Signed)
Called and spoke with pt and he put Onalee Hua on the phone.  Per Onalee Hua pt has been taking gabapentin 400 mg capsules- Take 1 tablet by mouth 3 times a day. Rx sent to [facility]

## 2014-07-31 ENCOUNTER — Other Ambulatory Visit: Payer: Self-pay | Admitting: Internal Medicine

## 2014-08-09 ENCOUNTER — Other Ambulatory Visit: Payer: Self-pay | Admitting: Internal Medicine

## 2014-08-09 NOTE — Telephone Encounter (Signed)
Spoke to son   patient is taking 1/2 tablet of amiodarone daily. Prescription filled.

## 2014-08-09 NOTE — Telephone Encounter (Signed)
Left message for patient to call back Need to confirm dose of amiodarone.

## 2014-08-17 LAB — LIPID PANEL
CHOLESTEROL: 177 mg/dL (ref 0–200)
HDL: 58 mg/dL (ref >=40)
LDL CALC: 108 mg/dL — AB (ref 0–99)
Total CHOL/HDL Ratio: 3.1 Ratio
Triglycerides: 56 mg/dL (ref <150)
VLDL: 11 mg/dL (ref 0–40)

## 2014-08-29 ENCOUNTER — Telehealth: Payer: Self-pay | Admitting: Internal Medicine

## 2014-08-29 ENCOUNTER — Other Ambulatory Visit: Payer: Self-pay | Admitting: Internal Medicine

## 2014-08-29 ENCOUNTER — Encounter: Payer: Self-pay | Admitting: Internal Medicine

## 2014-08-29 MED ORDER — TRAMADOL HCL 50 MG PO TABS: 50.0000 mg | ORAL_TABLET | Freq: Four times a day (QID) | ORAL | Status: DC | PRN

## 2014-08-29 NOTE — Telephone Encounter (Signed)
Pt needs refill on tramadol 50 mg #100  w/refills sent to [facility] [address]

## 2014-08-29 NOTE — Telephone Encounter (Signed)
[name] notified Rx called into pharmacy.

## 2014-11-05 ENCOUNTER — Other Ambulatory Visit: Payer: Self-pay | Admitting: Internal Medicine

## 2014-11-13 ENCOUNTER — Other Ambulatory Visit: Payer: Self-pay | Admitting: Internal Medicine

## 2014-11-25 ENCOUNTER — Ambulatory Visit (INDEPENDENT_AMBULATORY_CARE_PROVIDER_SITE_OTHER): Payer: Medicare Other | Admitting: Internal Medicine

## 2014-11-25 ENCOUNTER — Encounter: Payer: Self-pay | Admitting: Internal Medicine

## 2014-11-25 VITALS — BP 128/70 | HR 70 | Temp 98.4°F | Resp 20 | Ht 69.0 in | Wt 209.0 lb

## 2014-11-25 DIAGNOSIS — I5022 Chronic systolic (congestive) heart failure: Secondary | ICD-10-CM

## 2014-11-25 DIAGNOSIS — Z7901 Long term (current) use of anticoagulants: Secondary | ICD-10-CM

## 2014-11-25 DIAGNOSIS — R7302 Impaired glucose tolerance (oral): Secondary | ICD-10-CM

## 2014-11-25 DIAGNOSIS — I10 Essential (primary) hypertension: Secondary | ICD-10-CM

## 2014-11-25 DIAGNOSIS — E785 Hyperlipidemia, unspecified: Secondary | ICD-10-CM | POA: Diagnosis not present

## 2014-11-25 MED ORDER — BUMETANIDE 1 MG PO TABS: 0.5000 mg | ORAL_TABLET | Freq: Two times a day (BID) | ORAL | Status: DC

## 2014-11-25 NOTE — Progress Notes (Signed)
Pre visit review using our clinic review tool, if applicable. No additional management support is needed unless otherwise documented below in the visit note. 

## 2014-11-25 NOTE — Progress Notes (Signed)
Subjective:    Patient ID: [name], male    DOB: [birth year], 81 y.o.   MRN: [id number]  HPI  81 year old patient who is followed by cardiology.  Has a history of paroxysmal atrial fibrillation and systolic heart failure. Complaints today include the some dizziness.  This occasionally occurs when he stands from a sitting position but often occurs while walking.  He states this has been worse for the past 6 weeks, but this has been a chronic complaint that has improved with elimination of digoxin and decrease in the dose of diuretics and amiodarone There is been no peripheral edema.  He generally feels well.  Past Medical History  Diagnosis Date  . Obesity   . Hypertension   . Edema     Bilateral - left lower leg greater than right- wears compression stockings  . BPH (benign prostatic hypertrophy)   . Asthma   . Bronchospasm, exercise-induced   . CHF (congestive heart failure)     systolic, EF 40% (07/07/2012)  . GERD (gastroesophageal reflux disease)   . A-fib   . Shortness of breath   . Dyslipidemia   . Arthritis     knees  . Chronic kidney disease     stabilized, due to infection  . Macular degeneration 08-06-13    bilateral -sight impaired    History   Social History  . Marital Status: Widowed    Spouse Name: N/A  . Number of Children: N/A  . Years of Education: N/A   Occupational History  . retired    Social History Main Topics  . Smoking status: Former Smoker    Quit date: 05/17/1964  . Smokeless tobacco: Never Used  . Alcohol Use: No  . Drug Use: No  . Sexual Activity: Not Currently   Other Topics Concern  . Not on file   Social History Narrative    Past Surgical History  Procedure Laterality Date  . Vascular surgery  removed varicose veins  . Vasectomy    . Cardioversion N/A 07/07/2012    Procedure: CARDIOVERSION;  Surgeon: Chrystie Nose, MD;  Location: [facility] ENDOSCOPY;  Service: Cardiovascular;  Laterality: N/A;  . Cardioversion N/A  08/30/2012    Procedure: CARDIOVERSION;  Surgeon: Chrystie Nose, MD;  Location: [facility] ENDOSCOPY;  Service: Cardiovascular;  Laterality: N/A;  . Transthoracic echocardiogram  07/07/2012    ef 40%; mild MR; LA mod-severely dilated; RA mildly dilated; RV systolic function mildly reduced; RV systolic pressure increase - mod pulm htn  . Total hip arthroplasty Left 08/13/2013    Procedure: LEFT TOTAL HIP ARTHROPLASTY ANTERIOR APPROACH;  Surgeon: [name], MD;  Location: [facility] ORS;  Service: Orthopedics;  Laterality: Left;    Family History  Problem Relation Age of Onset  . Alzheimer's disease Mother   . Heart disease Father     Allergies  Allergen Reactions  . Nsaids     Stomach pain  . Other     Opiates cause tightness in chest  . Vicodin [Hydrocodone-Acetaminophen] Other (See Comments)    Unknown reaction many years ago  . Morphine And Related Nausea And Vomiting    Dizziness, light headed     Current Outpatient Prescriptions on File Prior to Visit  Medication Sig Dispense Refill  . acetaminophen (TYLENOL) 500 MG tablet Take 500 mg by mouth 3 (three) times daily. Takes with Tramadol    . albuterol (PROVENTIL HFA;VENTOLIN HFA) 108 (90 BASE) MCG/ACT inhaler Inhale 1 puff into the lungs every 6 (  six) hours as needed for wheezing or shortness of breath.    [name] amiodarone (PACERONE) 100 MG tablet Take 1 tablet (100 mg total) by mouth every morning. 30 tablet 6  . bumetanide (BUMEX) 1 MG tablet TAKE ONE-HALF TABLET BY MOUTH TWICE DAILY EXCEPT  WED. AND SAT.  TAKE ONE AND ONE-HALF TABLETS 35 tablet 5  . Calcium Carbonate-Vitamin D (CALCIUM 600+D) 600-400 MG-UNIT per tablet Take 1 tablet by mouth 3 (three) times a week. Monday Wednesday and Friday    . cetirizine (ZYRTEC) 10 MG tablet Take 10 mg by mouth daily as needed for allergies.    [name] docusate sodium 100 MG CAPS Take 100 mg by mouth 2 (two) times daily. 10 capsule 0  . fluocinonide (LIDEX) 0.05 % external solution Apply 1 application  topically 2 (two) times daily.   1  . gabapentin (NEURONTIN) 400 MG capsule Take 1 capsule (400 mg total) by mouth 3 (three) times daily. 270 capsule 1  . KLOR-CON M20 20 MEQ tablet TAKE ONE TABLET BY MOUTH ONCE DAILY 60 tablet 3  . Magnesium 250 MG TABS Take 250 mg by mouth daily.     . magnesium hydroxide (MILK OF MAGNESIA) 400 MG/5ML suspension Take 30 mLs by mouth daily as needed for mild constipation.    . Multiple Vitamin (MULTIVITAMIN WITH MINERALS) TABS tablet Take 1 tablet by mouth daily.    . Multiple Vitamins-Minerals (ICAPS AREDS FORMULA PO) Take 2 capsules by mouth 2 (two) times daily.    [name] omeprazole (PRILOSEC) 20 MG capsule Take 1 capsule (20 mg total) by mouth daily. 90 capsule 1  . PACERONE 200 MG tablet TAKE ONE TABLET BY MOUTH ONCE DAILY 30 tablet 6  . polyethylene glycol (MIRALAX / GLYCOLAX) packet Take 17 g by mouth 2 (two) times daily. 14 each 0  . rosuvastatin (CRESTOR) 20 MG tablet Take 20 mg by mouth once a week. Friday    . senna (SENOKOT) 8.6 MG TABS tablet Take 1 tablet by mouth daily as needed for mild constipation.    [name] SPIRIVA HANDIHALER 18 MCG inhalation capsule PLACE 1 CAPSULE INTO INHALER AND INHALE DAILY 30 capsule 0  . tamsulosin (FLOMAX) 0.4 MG CAPS capsule TAKE ONE CAPSULE BY MOUTH ONCE DAILY 30 capsule 5  . traMADol (ULTRAM) 50 MG tablet Take 1-2 tablets (50-100 mg total) by mouth every 6 (six) hours as needed. for pain 100 tablet 2  . XARELTO 20 MG TABS tablet TAKE ONE TABLET BY MOUTH ONCE DAILY 30 tablet 0  . zinc oxide (BALMEX) 11.3 % CREA cream Apply 1 application topically daily as needed. Applied to rash that is in between legs and buttocks area    . zolpidem (AMBIEN) 5 MG tablet Take 1 tablet (5 mg total) by mouth at bedtime as needed for sleep. 30 tablet 5   No current facility-administered medications on file prior to visit.    BP 128/70 mmHg  Pulse 70  Temp(Src) 98.4 F (36.9 C) (Oral)  Resp 20  Ht 5\' 9"  (1.753 m)  Wt 209 lb (94.802 kg)   BMI 30.85 kg/m2  SpO2 95%     Review of Systems  Constitutional: Negative for fever, chills, appetite change and fatigue.  HENT: Negative for congestion, dental problem, ear pain, hearing loss, sore throat, tinnitus, trouble swallowing and voice change.   Eyes: Negative for pain, discharge and visual disturbance.  Respiratory: Negative for cough, chest tightness, wheezing and stridor.   Cardiovascular: Negative for chest pain, palpitations and  leg swelling.  Gastrointestinal: Negative for nausea, vomiting, abdominal pain, diarrhea, constipation, blood in stool and abdominal distention.  Genitourinary: Negative for urgency, hematuria, flank pain, discharge, difficulty urinating and genital sores.  Musculoskeletal: Positive for arthralgias and gait problem. Negative for myalgias, back pain, joint swelling and neck stiffness.  Skin: Negative for rash.  Neurological: Positive for dizziness. Negative for syncope, speech difficulty, weakness, numbness and headaches.  Hematological: Negative for adenopathy. Does not bruise/bleed easily.  Psychiatric/Behavioral: Negative for behavioral problems and dysphoric mood. The patient is not nervous/anxious.        Objective:   Physical Exam  Constitutional: He is oriented to person, place, and time. He appears well-developed.  Blood pressure sitting 100 over 50  HENT:  Head: Normocephalic.  Right Ear: External ear normal.  Left Ear: External ear normal.  Eyes: Conjunctivae and EOM are normal.  Neck: Normal range of motion.  Cardiovascular: Normal rate and normal heart sounds.   Pulmonary/Chest: Breath sounds normal.  Abdominal: Bowel sounds are normal.  Musculoskeletal: Normal range of motion. He exhibits no edema or tenderness.  Neurological: He is alert and oriented to person, place, and time.  Psychiatric: He has a normal mood and affect. His behavior is normal.          Assessment & Plan:   Systolic heart failure.  Appears to be  euvolemic.  No peripheral edema.  Will decrease diuretic dose to 1 half tablet twice a day Dyslipidemia No extremity edema.  Stable Paroxysmal atrial fibrillation.  Continue chronic anticoagulation Status post left THA  Recheck 6 months

## 2014-11-25 NOTE — Patient Instructions (Signed)
Limit your sodium (Salt) intake  Return in 6 months for follow-up  

## 2014-12-05 ENCOUNTER — Other Ambulatory Visit: Payer: Self-pay | Admitting: Internal Medicine

## 2014-12-13 ENCOUNTER — Other Ambulatory Visit: Payer: Self-pay | Admitting: Internal Medicine

## 2014-12-17 ENCOUNTER — Other Ambulatory Visit: Payer: Self-pay | Admitting: Internal Medicine

## 2015-01-16 ENCOUNTER — Other Ambulatory Visit: Payer: Self-pay | Admitting: Internal Medicine

## 2015-02-04 ENCOUNTER — Ambulatory Visit (INDEPENDENT_AMBULATORY_CARE_PROVIDER_SITE_OTHER): Payer: Medicare Other | Admitting: Internal Medicine

## 2015-02-04 ENCOUNTER — Encounter: Payer: Self-pay | Admitting: Internal Medicine

## 2015-02-04 VITALS — BP 120/58 | HR 60 | Ht 64.5 in | Wt 206.2 lb

## 2015-02-04 DIAGNOSIS — R600 Localized edema: Secondary | ICD-10-CM | POA: Diagnosis not present

## 2015-02-04 DIAGNOSIS — I452 Bifascicular block: Secondary | ICD-10-CM

## 2015-02-04 DIAGNOSIS — R55 Syncope and collapse: Secondary | ICD-10-CM

## 2015-02-04 DIAGNOSIS — I4891 Unspecified atrial fibrillation: Secondary | ICD-10-CM | POA: Diagnosis not present

## 2015-02-04 DIAGNOSIS — I10 Essential (primary) hypertension: Secondary | ICD-10-CM

## 2015-02-04 DIAGNOSIS — I5022 Chronic systolic (congestive) heart failure: Secondary | ICD-10-CM

## 2015-02-04 NOTE — Progress Notes (Signed)
02/04/2015 [name]   Aug 28, 1926  [id number]    Primary Physicia Rogelia Boga, MD Primary Cardiologist: Dr Rennis Golden  CC: Leg edema, dizziness/presyncope  HPI: 81 y/o with a history of AF. We saw him in Feb 2014 and attempted cardioversion then but he failed to convert. He was put on Amiodarone and cardioverted successfully in April 2014. He has been on Amiodarone and Xarelto. He saw [name] recently with complaints of near syncopal episodes. He describes a "flushed feeling" with weakness. His HR at home is in the 50s. He has noted more of these episodes when he is trying to urinate. [name] had stopped his digoxin and lower his dose of amiodarone to 200 mg daily. Since that time his symptoms have improved significantly, however he has had some episodes of low blood pressure. At that time he was diagnosed with atrial fibrillation his EF was 40%, suspect it may be improved as it may been tachycardia related. He continues on Bumex 1 mg daily for most days of the week and 2 or 3 days a week takes it twice daily. This may however be too much diuretic for him.  He also wore a cardia monitor between 12/06/2012 at 12/19/2012. During the 315 hours of monitoring, there was only sinus rhythm and a short episode of sinus bradycardia at 59 with a first degree AV block and intraventricular conduction delay. No pauses or atrial fibrillation were noted.  [name] returns today and is feeling quite well. He continues to have labile hypotension and is not on any blood pressure medications. I would not add any medications due to the fact that in general his blood pressure since around 100 systolic. He does have higher blood pressures in the morning.  He recently underwent total hip arthroplasty by Dr. Charlann Boxer and has rehabilitated over the past several months. He reports an improvement in his ambulation. He is weight is stable in volume status appears stable. He did have edema after surgery which is improved.  He continues to have an underlying right bundle branch block but is maintaining sinus rhythm on amiodarone. He is intraventricular conduction delay is worse today however at 184 ms for the QRS. This suggests a significant intraventricular conduction delay.  The only offending medication could be amiodarone.   [name] returns today for follow-up. He has been having some dizziness with change in position and presyncope. Recently Dr. Kirtland Bouchard had decreased his Bumex to 0.5 mg twice a day. His daughter seems to think that this is helped with his symptoms. She is interested in decreasing the Bumex to 1/2 mg once daily.   Current Outpatient Prescriptions  Medication Sig Dispense Refill  . acetaminophen (TYLENOL) 500 MG tablet Take 500 mg by mouth 3 (three) times daily. Takes with Tramadol    . albuterol (PROVENTIL HFA;VENTOLIN HFA) 108 (90 BASE) MCG/ACT inhaler Inhale 1 puff into the lungs every 6 (six) hours as needed for wheezing or shortness of breath.    [name] amiodarone (PACERONE) 100 MG tablet Take 1 tablet (100 mg total) by mouth every morning. 30 tablet 6  . bumetanide (BUMEX) 1 MG tablet Take 0.5 mg by mouth daily.    . Calcium Carbonate-Vitamin D (CALCIUM 600+D) 600-400 MG-UNIT per tablet Take 1 tablet by mouth 3 (three) times a week. Monday Wednesday and Friday    . cetirizine (ZYRTEC) 10 MG tablet Take 10 mg by mouth daily as needed for allergies.    [name] docusate sodium 100 MG CAPS Take 100 mg  by mouth 2 (two) times daily. 10 capsule 0  . fluocinonide (LIDEX) 0.05 % external solution Apply 1 application topically 2 (two) times daily.   1  . gabapentin (NEURONTIN) 400 MG capsule TAKE ONE CAPSULE BY MOUTH THREE TIMES DAILY 270 capsule 0  . KLOR-CON M20 20 MEQ tablet TAKE ONE TABLET BY MOUTH ONCE DAILY 60 tablet 3  . Magnesium 250 MG TABS Take 250 mg by mouth daily.     . magnesium hydroxide (MILK OF MAGNESIA) 400 MG/5ML suspension Take 30 mLs by mouth daily as needed for mild constipation.    . Multiple  Vitamin (MULTIVITAMIN WITH MINERALS) TABS tablet Take 1 tablet by mouth daily.    . Multiple Vitamins-Minerals (ICAPS AREDS FORMULA PO) Take 2 capsules by mouth 2 (two) times daily.    [name] omeprazole (PRILOSEC) 20 MG capsule Take 1 capsule (20 mg total) by mouth daily. 90 capsule 1  . PACERONE 200 MG tablet TAKE ONE TABLET BY MOUTH ONCE DAILY 30 tablet 6  . polyethylene glycol (MIRALAX / GLYCOLAX) packet Take 17 g by mouth 2 (two) times daily. 14 each 0  . rosuvastatin (CRESTOR) 20 MG tablet Take 20 mg by mouth once a week. Friday    . senna (SENOKOT) 8.6 MG TABS tablet Take 1 tablet by mouth daily as needed for mild constipation.    [name] SPIRIVA HANDIHALER 18 MCG inhalation capsule PLACE ONE CAPSULE INTO INHALER AND INHALE DAILY 30 capsule 5  . tamsulosin (FLOMAX) 0.4 MG CAPS capsule TAKE ONE CAPSULE BY MOUTH ONCE DAILY 30 capsule 5  . traMADol (ULTRAM) 50 MG tablet TAKE ONE TO TWO TABLETS BY MOUTH EVERY 6 HOURS AS NEEDED FOR PAIN 100 tablet 2  . XARELTO 20 MG TABS tablet TAKE ONE TABLET BY MOUTH ONCE DAILY 30 tablet 2  . zinc oxide (BALMEX) 11.3 % CREA cream Apply 1 application topically daily as needed. Applied to rash that is in between legs and buttocks area    . zolpidem (AMBIEN) 5 MG tablet Take 1 tablet (5 mg total) by mouth at bedtime as needed for sleep. 30 tablet 5   No current facility-administered medications for this visit.    Allergies  Allergen Reactions  . Nsaids     Stomach pain  . Other     Opiates cause tightness in chest  . Vicodin [Hydrocodone-Acetaminophen] Other (See Comments)    Unknown reaction many years ago  . Morphine And Related Nausea And Vomiting    Dizziness, light headed     Social History   Social History  . Marital Status: Widowed    Spouse Name: N/A  . Number of Children: N/A  . Years of Education: N/A   Occupational History  . retired    Social History Main Topics  . Smoking status: Former Smoker    Quit date: 05/17/1964  . Smokeless  tobacco: Never Used  . Alcohol Use: No  . Drug Use: No  . Sexual Activity: Not Currently   Other Topics Concern  . Not on file   Social History Narrative     Review of Systems: General: negative for chills, fever, night sweats or weight changes.  Cardiovascular: negative for chest pain, dyspnea on exertion, edema, orthopnea, palpitations, paroxysmal nocturnal dyspnea or shortness of breath Dermatological: negative for rash Respiratory: negative for cough or wheezing Urologic: negative for hematuria Abdominal: negative for nausea, vomiting, diarrhea, bright red blood per rectum, melena, or hematemesis Neurologic: negative for visual changes, syncope, or dizziness All other  systems reviewed and are otherwise negative except as noted above.    Blood pressure 120/58, pulse 60, height 5' 4.5" (1.638 m), weight 206 lb 3.2 oz (93.532 kg).  General appearance: alert, no distress and pale Neck: no carotid bruit and no JVD Lungs: clear to auscultation bilaterally Heart: regular rate and rhythm Abdomen: soft, non-tender; bowel sounds normal; no masses,  no organomegaly Extremities: edema trace Pulses: 2+ and symmetric Skin: Skin color, texture, turgor normal. No rashes or lesions Neurologic: Mental status: Alert, oriented, thought content appropriate, in wheelchair psych: Mood, affect normal  EKG  Normal sinus at 60, Bifascicular block, QRS duration 176 msec  ASSESSMENT AND PLAN:   Patient Active Problem List   Diagnosis Date Noted  . Impaired glucose tolerance 11/25/2014  . Bifascicular block 12/18/2013  . Leg edema 10/25/2013  . CHF (congestive heart failure) 08/30/2013  . S/P left THA, AA 08/13/2013  . Chronic anticoagulation 12/06/2012  . Near syncope 12/06/2012  . Lower extremity edema 07/05/2012  . Acute systolic CHF (congestive heart failure), NYHA class 3 -- Unclear etilogy (? Afib related) EF down from 60-70% to 40%. 07/05/2012  . General weakness 07/05/2012  .  Paroxysmal Atrial fibrillation - admitted with RVR 01/18/2012  . DJD (degenerative joint disease), lumbar 01/18/2012  . Hx of colonic polyps 11/02/2011  . BENIGN PROSTATIC HYPERTROPHY 05/13/2010  . SEBORRHEA CAPITIS 02/13/2010  . History of elevated glucose 10/01/2009  . CHRONIC RHINITIS 06/02/2009  . Hyperlipidemia 11/01/2008  . NASAL POLYP 07/03/2008  . INSOMNIA, CHRONIC, MILD 01/02/2008  . Essential hypertension 02/06/2007  . ASTHMA 02/06/2007    PLAN   Mr. Feggins has recently been having some dizziness which seems to have improved by reducing his diuretics. His daughter wants to decrease it even further. I'm okay with that as long as his weight remains stable and there is no significant worsening of swelling or shortness of breath. She says she will keep a clear track of that. Rhythm appears to be sinus today without any change although the bifascicular block is much more significant at 176 ms. We have to be careful about the possibility of development of heart block. He is on very low-dose amiodarone 100 mg at this time. I'm hesitant to use a beta blocker or any other AV nodal conduction blockers in addition to that.  Chrystie Nose, MD, [facility] Attending Cardiologist [facility]  Lisette Abu [facility] 02/04/2015 5:21 PM

## 2015-02-04 NOTE — Patient Instructions (Signed)
Your physician has recommended you make the following change in your medication: DECREASE bumex to 1/2 tablet ONCE daily  Your physician wants you to follow-up in: 6 months with Dr. Rennis Golden. You will receive a reminder letter in the mail two months in advance. If you don't receive a letter, please call our office to schedule the follow-up appointment.

## 2015-03-07 ENCOUNTER — Ambulatory Visit: Payer: Medicare Other | Admitting: *Deleted

## 2015-03-12 ENCOUNTER — Other Ambulatory Visit: Payer: Self-pay | Admitting: Internal Medicine

## 2015-03-12 NOTE — Telephone Encounter (Signed)
Patient calling the office for samples of medication:   1.  What medication and dosage are you requesting samples for?Xarelto-pt is coming next door at 8:30 for an appt.he would like to pick them up while he is over here.  2.  Are you currently out of this medication? No,have a few left  3. Are you requesting samples to get you through until you receive your prescription? no

## 2015-03-12 NOTE — Telephone Encounter (Signed)
will let front desk know to inform pt that he needed to talk to his PCP for xarelto samples, as the PCP follows this medication.

## 2015-03-13 ENCOUNTER — Other Ambulatory Visit: Payer: Self-pay | Admitting: Internal Medicine

## 2015-03-27 ENCOUNTER — Telehealth: Payer: Self-pay | Admitting: Internal Medicine

## 2015-03-27 ENCOUNTER — Ambulatory Visit: Payer: Medicare Other | Admitting: Family Medicine

## 2015-03-27 NOTE — Telephone Encounter (Addendum)
Yes pt was sch for today 03-27-15 . Pt daughter called back and  reschedule appointment  for tomorrow.

## 2015-03-27 NOTE — Telephone Encounter (Signed)
Pt was prescribe ventolin hfa albuterol inhaler from [place] years ago. Pt son is requesting new rx [facility] [place] [facility]. Pt is having asthma flare up. If pt needs an appointment please contact his son. Pt son is aware md out of office for 3 wks. I inform son that dad will have to be seen he will consult with his sister to make an appointment

## 2015-03-27 NOTE — Telephone Encounter (Signed)
[name], I see pt is scheduled to see [facility] tomorrow were there no appointments today ,cause I can not order inhaler.

## 2015-03-27 NOTE — Telephone Encounter (Signed)
Okay, thanks

## 2015-03-28 ENCOUNTER — Encounter: Payer: Self-pay | Admitting: Adult Health

## 2015-03-28 ENCOUNTER — Ambulatory Visit (INDEPENDENT_AMBULATORY_CARE_PROVIDER_SITE_OTHER): Payer: Medicare Other | Admitting: Adult Health

## 2015-03-28 VITALS — BP 138/68 | HR 77 | Temp 98.7°F | Ht 64.5 in | Wt 205.2 lb

## 2015-03-28 DIAGNOSIS — Z760 Encounter for issue of repeat prescription: Secondary | ICD-10-CM

## 2015-03-28 MED ORDER — ALBUTEROL SULFATE HFA 108 (90 BASE) MCG/ACT IN AERS: 1.0000 | INHALATION_SPRAY | Freq: Four times a day (QID) | RESPIRATORY_TRACT | Status: DC | PRN

## 2015-03-28 NOTE — Progress Notes (Signed)
Subjective:    Patient ID: [name], male    DOB: 29-Dec-1926, 81 y.o.   MRN: [id number]  HPI  81 year old male who presents to the office today for refill for his Ventolin inhaler. He is not having any respiratory issues at this time.  His last prescription lasted him a few years. He does not use it on a daily basis.  Review of Systems  Constitutional: Negative.   Respiratory: Negative.   Cardiovascular: Negative.   Neurological: Negative.    Past Medical History  Diagnosis Date  . Obesity   . Hypertension   . Edema     Bilateral - left lower leg greater than right- wears compression stockings  . BPH (benign prostatic hypertrophy)   . Asthma   . Bronchospasm, exercise-induced   . CHF (congestive heart failure) (HCC)     systolic, EF 40% (07/07/2012)  . GERD (gastroesophageal reflux disease)   . A-fib (HCC)   . Shortness of breath   . Dyslipidemia   . Arthritis     knees  . Chronic kidney disease     stabilized, due to infection  . Macular degeneration 08-06-13    bilateral -sight impaired    Social History   Social History  . Marital Status: Widowed    Spouse Name: N/A  . Number of Children: N/A  . Years of Education: N/A   Occupational History  . retired    Social History Main Topics  . Smoking status: Former Smoker    Quit date: 05/17/1964  . Smokeless tobacco: Never Used  . Alcohol Use: No  . Drug Use: No  . Sexual Activity: Not Currently   Other Topics Concern  . Not on file   Social History Narrative    Past Surgical History  Procedure Laterality Date  . Vascular surgery  removed varicose veins  . Vasectomy    . Cardioversion N/A 07/07/2012    Procedure: CARDIOVERSION;  Surgeon: Chrystie Nose, MD;  Location: [facility] ENDOSCOPY;  Service: Cardiovascular;  Laterality: N/A;  . Cardioversion N/A 08/30/2012    Procedure: CARDIOVERSION;  Surgeon: Chrystie Nose, MD;  Location: [facility] ENDOSCOPY;  Service: Cardiovascular;  Laterality: N/A;  .  Transthoracic echocardiogram  07/07/2012    ef 40%; mild MR; LA mod-severely dilated; RA mildly dilated; RV systolic function mildly reduced; RV systolic pressure increase - mod pulm htn  . Total hip arthroplasty Left 08/13/2013    Procedure: LEFT TOTAL HIP ARTHROPLASTY ANTERIOR APPROACH;  Surgeon: [name], MD;  Location: [facility] ORS;  Service: Orthopedics;  Laterality: Left;    Family History  Problem Relation Age of Onset  . Alzheimer's disease Mother   . Heart disease Father     Allergies  Allergen Reactions  . Nsaids     Stomach pain  . Other     Opiates cause tightness in chest  . Vicodin [Hydrocodone-Acetaminophen] Other (See Comments)    Unknown reaction many years ago  . Morphine And Related Nausea And Vomiting    Dizziness, light headed     Current Outpatient Prescriptions on File Prior to Visit  Medication Sig Dispense Refill  . acetaminophen (TYLENOL) 500 MG tablet Take 500 mg by mouth 3 (three) times daily. Takes with Tramadol    . amiodarone (PACERONE) 100 MG tablet Take 1 tablet (100 mg total) by mouth every morning. 30 tablet 6  . bumetanide (BUMEX) 1 MG tablet Take 0.5 mg by mouth daily.    . Calcium  Carbonate-Vitamin D (CALCIUM 600+D) 600-400 MG-UNIT per tablet Take 1 tablet by mouth 3 (three) times a week. Monday Wednesday and Friday    . cetirizine (ZYRTEC) 10 MG tablet Take 10 mg by mouth daily as needed for allergies.    [name] docusate sodium 100 MG CAPS Take 100 mg by mouth 2 (two) times daily. 10 capsule 0  . fluocinonide (LIDEX) 0.05 % external solution Apply 1 application topically 2 (two) times daily.   1  . gabapentin (NEURONTIN) 400 MG capsule TAKE ONE CAPSULE BY MOUTH THREE TIMES DAILY 270 capsule 0  . KLOR-CON M20 20 MEQ tablet TAKE ONE TABLET BY MOUTH ONCE DAILY 60 tablet 3  . Magnesium 250 MG TABS Take 250 mg by mouth daily.     . magnesium hydroxide (MILK OF MAGNESIA) 400 MG/5ML suspension Take 30 mLs by mouth daily as needed for mild constipation.      . Multiple Vitamin (MULTIVITAMIN WITH MINERALS) TABS tablet Take 1 tablet by mouth daily.    . Multiple Vitamins-Minerals (ICAPS AREDS FORMULA PO) Take 2 capsules by mouth 2 (two) times daily.    [name] omeprazole (PRILOSEC) 20 MG capsule Take 1 capsule (20 mg total) by mouth daily. 90 capsule 1  . PACERONE 200 MG tablet TAKE ONE TABLET BY MOUTH ONCE DAILY 30 tablet 6  . polyethylene glycol (MIRALAX / GLYCOLAX) packet Take 17 g by mouth 2 (two) times daily. 14 each 0  . rosuvastatin (CRESTOR) 20 MG tablet Take 20 mg by mouth once a week. Friday    . senna (SENOKOT) 8.6 MG TABS tablet Take 1 tablet by mouth daily as needed for mild constipation.    [name] SPIRIVA HANDIHALER 18 MCG inhalation capsule PLACE ONE CAPSULE INTO INHALER AND INHALE DAILY 30 capsule 5  . tamsulosin (FLOMAX) 0.4 MG CAPS capsule TAKE ONE CAPSULE BY MOUTH ONCE DAILY 30 capsule 5  . traMADol (ULTRAM) 50 MG tablet TAKE ONE TO TWO TABLETS BY MOUTH EVERY 6 HOURS AS NEEDED FOR PAIN 100 tablet 2  . XARELTO 20 MG TABS tablet TAKE ONE TABLET BY MOUTH ONCE DAILY 30 tablet 2  . zinc oxide (BALMEX) 11.3 % CREA cream Apply 1 application topically daily as needed. Applied to rash that is in between legs and buttocks area    . zolpidem (AMBIEN) 5 MG tablet Take 1 tablet (5 mg total) by mouth at bedtime as needed for sleep. 30 tablet 5   No current facility-administered medications on file prior to visit.    BP 138/68 mmHg  Pulse 77  Temp(Src) 98.7 F (37.1 C) (Oral)  Ht 5' 4.5" (1.638 m)  Wt 205 lb 3.2 oz (93.078 kg)  BMI 34.69 kg/m2  SpO2 97%       Objective:   Physical Exam  Constitutional: He is oriented to person, place, and time. He appears well-developed and well-nourished. No distress.  Cardiovascular: Normal rate, regular rhythm, normal heart sounds and intact distal pulses.  Exam reveals no gallop and no friction rub.   No murmur heard. Pulmonary/Chest: Effort normal and breath sounds normal. No respiratory distress. He has  no wheezes. He has no rales. He exhibits no tenderness.  Neurological: He is alert and oriented to person, place, and time.  Skin: Skin is warm and dry. No rash noted. He is not diaphoretic. No erythema. No pallor.  Psychiatric: He has a normal mood and affect. His behavior is normal. Judgment and thought content normal.  Nursing note and vitals reviewed.  Assessment & Plan:  1. Medication refill - albuterol (PROVENTIL HFA;VENTOLIN HFA) 108 (90 BASE) MCG/ACT inhaler; Inhale 1-2 puffs into the lungs every 6 (six) hours as needed for wheezing or shortness of breath.  Dispense: 1 Inhaler; Refill: 3

## 2015-03-28 NOTE — Progress Notes (Signed)
Pre visit review using our clinic review tool, if applicable. No additional management support is needed unless otherwise documented below in the visit note. 

## 2015-04-17 ENCOUNTER — Other Ambulatory Visit: Payer: Self-pay | Admitting: Internal Medicine

## 2015-05-02 ENCOUNTER — Other Ambulatory Visit: Payer: Self-pay | Admitting: Internal Medicine

## 2015-05-26 NOTE — Progress Notes (Unsigned)
   Subjective:    Patient ID: [name], male    DOB: [birth year], 82 y.o.   MRN: [id number]  HPI    Review of Systems     Objective:   Physical Exam        Assessment & Plan:

## 2015-05-30 ENCOUNTER — Other Ambulatory Visit: Payer: Self-pay | Admitting: Internal Medicine

## 2015-06-02 ENCOUNTER — Ambulatory Visit (INDEPENDENT_AMBULATORY_CARE_PROVIDER_SITE_OTHER): Payer: Medicare Other | Admitting: Internal Medicine

## 2015-06-02 ENCOUNTER — Encounter: Payer: Self-pay | Admitting: Internal Medicine

## 2015-06-02 VITALS — BP 132/70 | HR 58 | Temp 98.4°F | Resp 20 | Ht 64.5 in | Wt 210.0 lb

## 2015-06-02 DIAGNOSIS — E785 Hyperlipidemia, unspecified: Secondary | ICD-10-CM | POA: Diagnosis not present

## 2015-06-02 DIAGNOSIS — R531 Weakness: Secondary | ICD-10-CM | POA: Diagnosis not present

## 2015-06-02 DIAGNOSIS — I5020 Unspecified systolic (congestive) heart failure: Secondary | ICD-10-CM

## 2015-06-02 DIAGNOSIS — Z0000 Encounter for general adult medical examination without abnormal findings: Secondary | ICD-10-CM | POA: Diagnosis not present

## 2015-06-02 DIAGNOSIS — Z86010 Personal history of colonic polyps: Secondary | ICD-10-CM

## 2015-06-02 DIAGNOSIS — R7302 Impaired glucose tolerance (oral): Secondary | ICD-10-CM

## 2015-06-02 LAB — CBC WITH DIFFERENTIAL/PLATELET
BASOS PCT: 0.5 % (ref 0.0–3.0)
Basophils Absolute: 0 10*3/uL (ref 0.0–0.1)
EOS ABS: 0.1 10*3/uL (ref 0.0–0.7)
EOS PCT: 2.2 % (ref 0.0–5.0)
HEMATOCRIT: 34.5 % — AB (ref 39.0–52.0)
HEMOGLOBIN: 11.6 g/dL — AB (ref 13.0–17.0)
LYMPHS PCT: 41.5 % (ref 12.0–46.0)
Lymphs Abs: 2.1 10*3/uL (ref 0.7–4.0)
MCHC: 33.5 g/dL (ref 30.0–36.0)
MCV: 94.4 fl (ref 78.0–100.0)
Monocytes Absolute: 0.6 10*3/uL (ref 0.1–1.0)
Monocytes Relative: 11 % (ref 3.0–12.0)
Neutro Abs: 2.3 10*3/uL (ref 1.4–7.7)
Neutrophils Relative %: 44.8 % (ref 43.0–77.0)
Platelets: 224 10*3/uL (ref 150.0–400.0)
RBC: 3.65 Mil/uL — ABNORMAL LOW (ref 4.22–5.81)
RDW: 14.7 % (ref 11.5–15.5)
WBC: 5.1 10*3/uL (ref 4.0–10.5)

## 2015-06-02 LAB — TSH: TSH: 2.55 u[IU]/mL (ref 0.35–4.50)

## 2015-06-02 LAB — COMPREHENSIVE METABOLIC PANEL
ALBUMIN: 3.9 g/dL (ref 3.5–5.2)
ALT: 7 U/L (ref 0–53)
AST: 13 U/L (ref 0–37)
Alkaline Phosphatase: 64 U/L (ref 39–117)
BILIRUBIN TOTAL: 0.5 mg/dL (ref 0.2–1.2)
BUN: 12 mg/dL (ref 6–23)
CALCIUM: 9 mg/dL (ref 8.4–10.5)
CO2: 28 meq/L (ref 19–32)
Chloride: 99 mEq/L (ref 96–112)
Creatinine, Ser: 0.82 mg/dL (ref 0.40–1.50)
GFR: 94.66 mL/min (ref >60.00)
Glucose, Bld: 89 mg/dL (ref 70–99)
Potassium: 4.1 mEq/L (ref 3.5–5.1)
Sodium: 137 mEq/L (ref 135–145)
TOTAL PROTEIN: 6.6 g/dL (ref 6.0–8.3)

## 2015-06-02 LAB — HEMOGLOBIN A1C: Hgb A1c MFr Bld: 5.6 % (ref 4.6–6.5)

## 2015-06-02 NOTE — Progress Notes (Signed)
Subjective:    Patient ID: [name], male    DOB: 05-24-1926, 82 y.o.   MRN: [id number]  HPI    Subjective:    Patient ID: [name], male    DOB: 08/28/1926, 82 y.o.   MRN: [id number]  HPI  82 year old patient who is seen today for a preventive health examination.   He is followed closely by cardiology due to atrial fibrillation and history of systolic heart failure. He has chronic lower extremity edema that has been stable.  He is followed by ophthalmology due to macular degeneration.  The left eye worse than the right.  He has a history of impaired glucose tolerance.  More recently has been placed on potassium supplementation due to a mild hypokalemia.  He has issues with chronic constipation. His cardiac status has been stable. He has considerable osteoarthritis.   Past Medical History  Diagnosis Date  . Obesity   . Hypertension   . Edema     Bilateral - left lower leg greater than right- wears compression stockings  . BPH (benign prostatic hypertrophy)   . Asthma   . Bronchospasm, exercise-induced   . CHF (congestive heart failure) (HCC)     systolic, EF 40% (07/07/2012)  . GERD (gastroesophageal reflux disease)   . A-fib (HCC)   . Shortness of breath   . Dyslipidemia   . Arthritis     knees  . Chronic kidney disease     stabilized, due to infection  . Macular degeneration 08-06-13    bilateral -sight impaired    Social History   Social History  . Marital Status: Widowed    Spouse Name: N/A  . Number of Children: N/A  . Years of Education: N/A   Occupational History  . retired    Social History Main Topics  . Smoking status: Former Smoker    Quit date: 05/17/1964  . Smokeless tobacco: Never Used  . Alcohol Use: No  . Drug Use: No  . Sexual Activity: Not Currently   Other Topics Concern  . Not on file   Social History Narrative    Past Surgical History  Procedure Laterality Date  . Vascular surgery  removed varicose veins  . Vasectomy     . Cardioversion N/A 07/07/2012    Procedure: CARDIOVERSION;  Surgeon: Chrystie Nose, MD;  Location: [facility] ENDOSCOPY;  Service: Cardiovascular;  Laterality: N/A;  . Cardioversion N/A 08/30/2012    Procedure: CARDIOVERSION;  Surgeon: Chrystie Nose, MD;  Location: [facility] ENDOSCOPY;  Service: Cardiovascular;  Laterality: N/A;  . Transthoracic echocardiogram  07/07/2012    ef 40%; mild MR; LA mod-severely dilated; RA mildly dilated; RV systolic function mildly reduced; RV systolic pressure increase - mod pulm htn  . Total hip arthroplasty Left 08/13/2013    Procedure: LEFT TOTAL HIP ARTHROPLASTY ANTERIOR APPROACH;  Surgeon: [name], MD;  Location: [facility] ORS;  Service: Orthopedics;  Laterality: Left;    Family History  Problem Relation Age of Onset  . Alzheimer's disease Mother   . Heart disease Father     Allergies  Allergen Reactions  . Nsaids     Stomach pain  . Other     Opiates cause tightness in chest  . Vicodin [Hydrocodone-Acetaminophen] Other (See Comments)    Unknown reaction many years ago  . Morphine And Related Nausea And Vomiting    Dizziness, light headed     Current Outpatient Prescriptions on File Prior to Visit  Medication Sig Dispense Refill  .  acetaminophen (TYLENOL) 500 MG tablet Take 500 mg by mouth 3 (three) times daily. Takes with Tramadol    . albuterol (PROVENTIL HFA;VENTOLIN HFA) 108 (90 BASE) MCG/ACT inhaler Inhale 1-2 puffs into the lungs every 6 (six) hours as needed for wheezing or shortness of breath. 1 Inhaler 3  . amiodarone (PACERONE) 100 MG tablet Take 1 tablet (100 mg total) by mouth every morning. 30 tablet 6  . bumetanide (BUMEX) 1 MG tablet Take 0.5 mg by mouth daily.    . Calcium Carbonate-Vitamin D (CALCIUM 600+D) 600-400 MG-UNIT per tablet Take 1 tablet by mouth 3 (three) times a week. Monday Wednesday and Friday    . cetirizine (ZYRTEC) 10 MG tablet Take 10 mg by mouth daily as needed for allergies.    [name] docusate sodium 100 MG CAPS Take 100  mg by mouth 2 (two) times daily. 10 capsule 0  . fluocinonide (LIDEX) 0.05 % external solution Apply 1 application topically 2 (two) times daily.   1  . gabapentin (NEURONTIN) 400 MG capsule TAKE ONE CAPSULE BY MOUTH THREE TIMES DAILY 270 capsule 0  . KLOR-CON M20 20 MEQ tablet TAKE ONE TABLET BY MOUTH ONCE DAILY 60 tablet 3  . Magnesium 250 MG TABS Take 250 mg by mouth daily.     . magnesium hydroxide (MILK OF MAGNESIA) 400 MG/5ML suspension Take 30 mLs by mouth daily as needed for mild constipation.    . Multiple Vitamin (MULTIVITAMIN WITH MINERALS) TABS tablet Take 1 tablet by mouth daily.    . Multiple Vitamins-Minerals (ICAPS AREDS FORMULA PO) Take 2 capsules by mouth 2 (two) times daily.    [name] omeprazole (PRILOSEC) 20 MG capsule Take 1 capsule (20 mg total) by mouth daily. 90 capsule 1  . PACERONE 200 MG tablet TAKE ONE TABLET BY MOUTH ONCE DAILY 30 tablet 6  . polyethylene glycol (MIRALAX / GLYCOLAX) packet Take 17 g by mouth 2 (two) times daily. 14 each 0  . rosuvastatin (CRESTOR) 20 MG tablet Take 20 mg by mouth once a week. Friday    . senna (SENOKOT) 8.6 MG TABS tablet Take 1 tablet by mouth daily as needed for mild constipation.    [name] SPIRIVA HANDIHALER 18 MCG inhalation capsule PLACE ONE CAPSULE INTO INHALER AND INHALE DAILY 30 capsule 5  . tamsulosin (FLOMAX) 0.4 MG CAPS capsule TAKE ONE CAPSULE BY MOUTH ONCE DAILY 30 capsule 0  . traMADol (ULTRAM) 50 MG tablet TAKE ONE TO TWO TABLETS BY MOUTH EVERY 6 HOURS AS NEEDED FOR PAIN 100 tablet 2  . XARELTO 20 MG TABS tablet TAKE ONE TABLET BY MOUTH ONCE DAILY 30 tablet 2  . zinc oxide (BALMEX) 11.3 % CREA cream Apply 1 application topically daily as needed. Applied to rash that is in between legs and buttocks area    . zolpidem (AMBIEN) 5 MG tablet Take 1 tablet (5 mg total) by mouth at bedtime as needed for sleep. 30 tablet 5   No current facility-administered medications on file prior to visit.    BP 132/70 mmHg  Pulse 58  Temp(Src)  98.4 F (36.9 C) (Oral)  Resp 20  Ht 5' 4.5" (1.638 m)  Wt 210 lb (95.255 kg)  BMI 35.50 kg/m2  SpO2 98%   1. Risk factors, based on past  M,S,F history.  Cardiovascular risk factors include hypertension and dyslipidemia.  Patient is followed by cardiology due to chronic systolic heart failure.  He has a history of atrial fibrillation  2.  Physical activities: Limited  due to age.  Arthritis  3.  Depression/mood: No history of major depression or mood disorder  4.  Hearing: Mild deficits  5.  ADL's: Requires assistance with all aspects of daily living  6.  Fall risk: High due to arthritis, obesity and general debility and age  33.  Home safety: No problems identified  8.  Height [name], and visual acuity; height and [name] stable followed by ophthalmology due to macular degeneration  9.  Counseling: Heart healthy diet recommended.  Restricted salt diet encouraged  10. Lab orders based on risk factors: Laboratory update including CBC and chemistries will be reviewed  11. Referral : Follow-up cardiology   12. Care plan: Continue heart healthy diet efforts at salt restrictions.  13. Cognitive assessment: Alert in order with normal affect.  No cognitive dysfunction  14. Screening: Patient provided with a written and personalized 5-10 year screening schedule in the AVS.  patient will have annual clinical examinations with screening lab.  Be followed periodically by cardiology, primary care medicine.  He will have routine ophthalmology follow-up  15. Provider List Update: Includes primary care medicine and ophthalmology and cardiology      Review of Systems  Constitutional: Negative for fever, chills, appetite change and fatigue.  HENT: Negative for congestion, dental problem, ear pain, hearing loss, sore throat, tinnitus, trouble swallowing and voice change.   Eyes: Negative for pain, discharge and visual disturbance.  Respiratory: Negative for cough, chest tightness, wheezing  and stridor.   Cardiovascular: Positive for leg swelling. Negative for chest pain and palpitations.  Gastrointestinal: Negative for nausea, vomiting, abdominal pain, diarrhea, constipation, blood in stool and abdominal distention.  Genitourinary: Negative for urgency, hematuria, flank pain, discharge, difficulty urinating and genital sores.  Musculoskeletal: Positive for arthralgias, gait problem and joint swelling. Negative for back pain, myalgias and neck stiffness.  Skin: Negative for rash.  Neurological: Positive for weakness. Negative for dizziness, syncope, speech difficulty, numbness and headaches.  Hematological: Negative for adenopathy. Does not bruise/bleed easily.  Psychiatric/Behavioral: Negative for behavioral problems and dysphoric mood. The patient is not nervous/anxious.        Objective:   Physical Exam  Constitutional: He is oriented to person, place, and time. He appears well-developed.  HENT:  Head: Normocephalic.  Right Ear: External ear normal.  Left Ear: External ear normal.  Eyes: Conjunctivae and EOM are normal.  Neck: Normal range of motion.  Cardiovascular: Normal rate and regular rhythm.   Murmur heard. Pulmonary/Chest: Breath sounds normal.  Abdominal: Bowel sounds are normal.  Musculoskeletal: Normal range of motion. He exhibits edema. He exhibits no tenderness.  Neurological: He is alert and oriented to person, place, and time.  Psychiatric: He has a normal mood and affect. His behavior is normal.          Assessment & Plan:   Hypertension well controlled Congestive heart failure, compensated  Dyslipidemia.  Continue Crestor Atrial fibrillation.  Followup cardiology  Recheck in 6 months  Review of Systems     Objective:   Physical Exam  HENT:  Moderate wax in both canals Dentures in place  Eyes:  Wears glasses Arcus senilis   Cardiovascular:  Regular rhythm Grade 3/6 systolic murmur heard diffusely  Dorsalis pedis pulses  intact.  Posterior tibial pulses not easily palpable   Genitourinary:  Uncircumcised male  Musculoskeletal: He exhibits edema.          Assessment & Plan:   Preventive health examination Chronic systolic heart failure, compensated History of paroxysmal atrial  fibrillation.  Continue chronic anticoagulation Osteoarthritis Dyslipidemia.  Continue statin therapy History of impaired glucose tolerance.  We'll check a hemoglobin A1c

## 2015-06-02 NOTE — Progress Notes (Signed)
Pre visit review using our clinic review tool, if applicable. No additional management support is needed unless otherwise documented below in the visit note. 

## 2015-06-02 NOTE — Patient Instructions (Signed)
Limit your sodium (Salt) intake  Cardiology follow-up as scheduled  Return in here in 6 months or as needed

## 2015-06-13 ENCOUNTER — Other Ambulatory Visit: Payer: Self-pay | Admitting: Internal Medicine

## 2015-06-26 ENCOUNTER — Other Ambulatory Visit: Payer: Self-pay | Admitting: Internal Medicine

## 2015-07-01 ENCOUNTER — Other Ambulatory Visit: Payer: Self-pay | Admitting: Internal Medicine

## 2015-07-22 ENCOUNTER — Other Ambulatory Visit: Payer: Self-pay | Admitting: Internal Medicine

## 2015-08-05 ENCOUNTER — Telehealth: Payer: Self-pay | Admitting: Internal Medicine

## 2015-08-05 MED ORDER — RIVAROXABAN 20 MG PO TABS
20.0000 mg | ORAL_TABLET | Freq: Every day | ORAL | Status: AC
Start: 2015-08-05 — End: ?

## 2015-08-05 NOTE — Telephone Encounter (Signed)
Returned call, Corrie Dandy aware samples available for pickup at appt tomorrow.

## 2015-08-05 NOTE — Telephone Encounter (Signed)
New message  Pt daughter called for samples of Xarelto to pick up during ov on 08/06/2015. Please call back to confirm that the message was received

## 2015-08-06 ENCOUNTER — Encounter: Payer: Self-pay | Admitting: Internal Medicine

## 2015-08-06 ENCOUNTER — Ambulatory Visit (INDEPENDENT_AMBULATORY_CARE_PROVIDER_SITE_OTHER): Payer: Medicare Other | Admitting: Internal Medicine

## 2015-08-06 VITALS — BP 84/60 | HR 66 | Ht 66.0 in | Wt 209.3 lb

## 2015-08-06 DIAGNOSIS — R600 Localized edema: Secondary | ICD-10-CM | POA: Diagnosis not present

## 2015-08-06 DIAGNOSIS — R0600 Dyspnea, unspecified: Secondary | ICD-10-CM

## 2015-08-06 DIAGNOSIS — Z79899 Other long term (current) drug therapy: Secondary | ICD-10-CM | POA: Diagnosis not present

## 2015-08-06 MED ORDER — MIDODRINE HCL 10 MG PO TABS: 10.0000 mg | ORAL_TABLET | Freq: Every day | ORAL | Status: DC | PRN

## 2015-08-06 MED ORDER — BUMETANIDE 1 MG PO TABS: 0.5000 mg | ORAL_TABLET | Freq: Two times a day (BID) | ORAL | Status: DC

## 2015-08-06 NOTE — Patient Instructions (Addendum)
Your physician has requested that you have an echocardiogram @ [address] - [address]. Echocardiography is a painless test that uses sound waves to create images of your heart. It provides your doctor with information about the size and shape of your heart and how well your heart's chambers and valves are working. This procedure takes approximately one hour. There are no restrictions for this procedure.  Medication Changes 1. Increase bumex to 0.5mg  twice daily 2. Midodrine 10mg  - take one tablet once daily for systolic blood pressure under 90  Your physician recommends that you return for lab work in ONE WEEK (BMET, BNP)  Your physician recommends that you schedule a follow-up appointment in 6 weeks

## 2015-08-06 NOTE — Progress Notes (Signed)
Patient ID: [name], male   DOB: December 13, 1926, 82 y.o.   MRN: [id number]    OFFICE NOTE  Chief Complaint:  Sherlynn Stalls Headedness  Primary Care Physician: Rogelia Boga, MD  HPI:  Najae Rathert Chauncey is a 82 y.o. male with a history of AF. We saw him in Feb 2014 and attempted cardioversion then but he failed to convert. He was put on Amiodarone and cardioverted successfully in April 2014. He has been on Amiodarone and Xarelto. He saw [name] recently with complaints of near syncopal episodes. He describes a "flushed feeling" with weakness. His HR at home is in the 50s. He has noted more of these episodes when he is trying to urinate. [name] had stopped his digoxin and lower his dose of amiodarone to 200 mg daily. Since that time his symptoms have improved significantly, however he has had some episodes of low blood pressure. At that time he was diagnosed with atrial fibrillation his EF was 40%, suspect it may be improved as it may been tachycardia related. He continues on Bumex 1 mg daily for most days of the week and 2 or 3 days a week takes it twice daily. This may however be too much diuretic for him. He also wore a cardia monitor between 12/06/2012 at 12/19/2012. During the 315 hours of monitoring, there was only sinus rhythm and a short episode of sinus bradycardia at 59 with a first degree AV block and intraventricular conduction delay. No pauses or atrial fibrillation were noted.  Mr. Weddington returns for follow up and is feeling ok. Continues having intermittent episodes of lightheadedness whenever he stands to walk around. He says around 2-3 times / week. BP at home has been around 123/65 in the morning, but will at times be in the low 80's / 50's - 60's. His BP is low in the office today 84/60. When it is low he is symptomatic and feels lightheaded, particularly when he stands up to walk around his house. Additionally, He says he feels like he has had more fluid on board in his legs. This  has occurred since reducing the dose of his diuretics at the last office visit. He says he has had edema up to his thigh, though his feet are less swollen since he has been wearing his compression stockings. No palpitations, or chest pain. He denies shortness of breath at rest or with exertion as well. He sleeps sitting up in a recliner for convenience. He says he does not get SOB with laying flat.   PMHx:  Past Medical History  Diagnosis Date  . Obesity   . Hypertension   . Edema     Bilateral - left lower leg greater than right- wears compression stockings  . BPH (benign prostatic hypertrophy)   . Asthma   . Bronchospasm, exercise-induced   . CHF (congestive heart failure) (HCC)     systolic, EF 40% (07/07/2012)  . GERD (gastroesophageal reflux disease)   . A-fib (HCC)   . Shortness of breath   . Dyslipidemia   . Arthritis     knees  . Chronic kidney disease     stabilized, due to infection  . Macular degeneration 08-06-13    bilateral -sight impaired    Past Surgical History  Procedure Laterality Date  . Vascular surgery  removed varicose veins  . Vasectomy    . Cardioversion N/A 07/07/2012    Procedure: CARDIOVERSION;  Surgeon: Chrystie Nose, MD;  Location: [facility] ENDOSCOPY;  Service: Cardiovascular;  Laterality: N/A;  . Cardioversion N/A 08/30/2012    Procedure: CARDIOVERSION;  Surgeon: Chrystie Nose, MD;  Location: [facility] ENDOSCOPY;  Service: Cardiovascular;  Laterality: N/A;  . Transthoracic echocardiogram  07/07/2012    ef 40%; mild MR; LA mod-severely dilated; RA mildly dilated; RV systolic function mildly reduced; RV systolic pressure increase - mod pulm htn  . Total hip arthroplasty Left 08/13/2013    Procedure: LEFT TOTAL HIP ARTHROPLASTY ANTERIOR APPROACH;  Surgeon: [name], MD;  Location: [facility] ORS;  Service: Orthopedics;  Laterality: Left;    FAMHx:  Family History  Problem Relation Age of Onset  . Alzheimer's disease Mother   . Heart disease Father      SOCHx:   reports that he quit smoking about 51 years ago. He has never used smokeless tobacco. He reports that he does not drink alcohol or use illicit drugs.  ALLERGIES:  Allergies  Allergen Reactions  . Nsaids     Stomach pain  . Other     Opiates cause tightness in chest  . Vicodin [Hydrocodone-Acetaminophen] Other (See Comments)    Unknown reaction many years ago  . Morphine And Related Nausea And Vomiting    Dizziness, light headed     ROS: Pertinent items are noted in HPI.  HOME MEDS: Current Outpatient Prescriptions  Medication Sig Dispense Refill  . acetaminophen (TYLENOL) 500 MG tablet Take 500 mg by mouth 3 (three) times daily. Takes with Tramadol    . albuterol (PROVENTIL HFA;VENTOLIN HFA) 108 (90 BASE) MCG/ACT inhaler Inhale 1-2 puffs into the lungs every 6 (six) hours as needed for wheezing or shortness of breath. 1 Inhaler 3  . amiodarone (PACERONE) 200 MG tablet Take 100 mg by mouth daily.    . bumetanide (BUMEX) 1 MG tablet Take 0.5 mg by mouth daily.    . Calcium Carbonate-Vitamin D (CALCIUM 600+D) 600-400 MG-UNIT per tablet Take 1 tablet by mouth 3 (three) times a week. Monday Wednesday and Friday    . cetirizine (ZYRTEC) 10 MG tablet Take 10 mg by mouth daily as needed for allergies.    [name] docusate sodium 100 MG CAPS Take 100 mg by mouth 2 (two) times daily. 10 capsule 0  . gabapentin (NEURONTIN) 400 MG capsule TAKE ONE CAPSULE BY MOUTH THREE TIMES DAILY 270 capsule 1  . Magnesium 250 MG TABS Take 250 mg by mouth daily.     . Multiple Vitamin (MULTIVITAMIN WITH MINERALS) TABS tablet Take 1 tablet by mouth daily.    . Multiple Vitamins-Minerals (ICAPS AREDS FORMULA PO) Take 2 capsules by mouth 2 (two) times daily.    [name] omeprazole (PRILOSEC) 20 MG capsule Take 1 capsule (20 mg total) by mouth daily. 90 capsule 1  . polyethylene glycol (MIRALAX / GLYCOLAX) packet Take 17 g by mouth 2 (two) times daily. 14 each 0  . potassium chloride SA (K-DUR,KLOR-CON) 20 MEQ  tablet Take 10 mEq by mouth daily.    . rivaroxaban (XARELTO) 20 MG TABS tablet Take 1 tablet (20 mg total) by mouth daily. 21 tablet 0  . rosuvastatin (CRESTOR) 20 MG tablet Take 20 mg by mouth once a week. Friday    . senna (SENOKOT) 8.6 MG TABS tablet Take 1 tablet by mouth daily as needed for mild constipation.    [name] SPIRIVA HANDIHALER 18 MCG inhalation capsule PLACE ONE CAPSULE INTO INHALER AND INHALE DAILY 30 capsule 5  . tamsulosin (FLOMAX) 0.4 MG CAPS capsule TAKE ONE CAPSULE BY MOUTH ONCE DAILY 30 capsule  5  . traMADol (ULTRAM) 50 MG tablet TAKE ONE TO TWO TABLETS BY MOUTH EVERY 6 HOURS AS NEEDED 100 tablet 1  . zolpidem (AMBIEN) 5 MG tablet Take 1 tablet (5 mg total) by mouth at bedtime as needed for sleep. 30 tablet 5   No current facility-administered medications for this visit.    LABS/IMAGING: No results found for this or any previous visit (from the past 48 hour(s)). No results found.  WEIGHTS: Wt Readings from Last 3 Encounters:  08/06/15 209 lb 5 oz (94.944 kg)  06/02/15 210 lb (95.255 kg)  03/28/15 205 lb 3.2 oz (93.078 kg)    VITALS: BP 84/60 mmHg  Pulse 66  Ht  (1.676 m)  Wt 209 lb 5 oz (94.944 kg)  BMI 33.80 kg/m2  EXAM: General appearance: alert, cooperative and no distress Neck: no adenopathy, no carotid bruit, no JVD, supple, symmetrical, trachea midline and thyroid not enlarged, symmetric, no tenderness/mass/nodules Lungs: Bibasilar Crackles Heart: regular rate and rhythm, S1, S2 normal and systolic murmur: systolic ejection 3/6, harsh at 2nd left intercostal space Abdomen: soft, non-tender; bowel sounds normal; no masses,  no organomegaly Extremities: edema 2+ edema , pitting edema to the thigh bilaterally Pulses: 2+ and symmetric Skin: Skin color, texture, turgor normal. No rashes or lesions  EKG: 3/22: NSR, Bifascicular Block. No new changes from previous.   ASSESSMENT: Past Medical History  Diagnosis Date  . Obesity   . Hypertension   .  Edema     Bilateral - left lower leg greater than right- wears compression stockings  . BPH (benign prostatic hypertrophy)   . Asthma   . Bronchospasm, exercise-induced   . CHF (congestive heart failure) (HCC)     systolic, EF 40% (07/07/2012)  . GERD (gastroesophageal reflux disease)   . A-fib (HCC)   . Shortness of breath   . Dyslipidemia   . Arthritis     knees  . Chronic kidney disease     stabilized, due to infection  . Macular degeneration 08-06-13    bilateral -sight impaired   1. Orthostasis 2. Edema 3. Hypotension 4. Paroxysmal Atrial Fibrillation 5.   PLAN: 1.   Will continue to treat afib with Amiodarone   2.   Need to balance diuresis with orthostasis / hypotension. Increase Bumex to 0.5mg  BID 3.   Start Midodrine  prn SBP < 90.  4.   Repeat Echocardiogram 5.   Continue Xarelto for now. If he begins struggling with orthostasis / falls may need to reconsider.  6.   Repeat BMET in 1 week for K, Cr.    Dima Ferrufino 08/06/2015, 3:17 PM   Pt. Seen and examined. Agree with the NP/[name] note as written. Mr. Rumbold was seen today for worsening lower extremity swelling. He's been having problems with some orthostasis and we have backed off on his diuretics. Currently he is on Bumex 0.5 mg daily. He has, however had close to 10 pound weight gain and feels that he has significant edema and his thighs. He's wearing compression stockings however his legs are only minimally edematous. The problem is that he continues to have orthostatic blood pressures. I recommend checking an echocardiogram to assess for development of possible congestive heart failure secondary to cardiomyopathy which could explain labile hypertension. Otherwise, he is not currently on any blood pressure medications that we can discontinue. For short time he was off of Flomax and decompensated significantly. There was no change in his labile hypertension. I'm recommending that we  start midodrine 10 mg as  needed for systolic blood pressure less than 90. He should monitor his blood pressure and when he feels bad can take the medication to help raise it. Hopefully that will allow [place] room to increase his diuretic to Bumex 0.5 mg twice a day. This will help combat his volume overload. His daughter is concerned because he cannot see well about his ability to interpret blood pressure readings from his automatic cuff. I advised her that there are automatic cuffs available that will make her blood pressure reading audible for the patient.  We will check a repeat metabolic profile and BNP next week after adjusting his medications. I will ultimately plan to see him back in 6 weeks to review his echocardiogram and diuresis.  Chrystie Nose, MD, [facility] Attending Cardiologist [facility]

## 2015-08-22 ENCOUNTER — Other Ambulatory Visit (HOSPITAL_COMMUNITY): Payer: Medicare Other

## 2015-08-23 LAB — BASIC METABOLIC PANEL
BUN: 12 mg/dL (ref 7–25)
CALCIUM: 8.4 mg/dL — AB (ref 8.6–10.3)
CO2: 28 mmol/L (ref 20–31)
CREATININE: 0.99 mg/dL (ref 0.70–1.11)
Chloride: 99 mmol/L (ref 98–110)
GLUCOSE: 112 mg/dL — AB (ref 65–99)
Potassium: 3.8 mmol/L (ref 3.5–5.3)
Sodium: 137 mmol/L (ref 135–146)

## 2015-08-23 LAB — BRAIN NATRIURETIC PEPTIDE: Brain Natriuretic Peptide: 78.1 pg/mL (ref <100)

## 2015-08-28 ENCOUNTER — Other Ambulatory Visit: Payer: Self-pay | Admitting: Internal Medicine

## 2015-09-04 ENCOUNTER — Ambulatory Visit (HOSPITAL_COMMUNITY): Payer: Medicare Other | Attending: Cardiology

## 2015-09-04 ENCOUNTER — Other Ambulatory Visit: Payer: Self-pay

## 2015-09-04 DIAGNOSIS — I352 Nonrheumatic aortic (valve) stenosis with insufficiency: Secondary | ICD-10-CM | POA: Insufficient documentation

## 2015-09-04 DIAGNOSIS — I119 Hypertensive heart disease without heart failure: Secondary | ICD-10-CM | POA: Insufficient documentation

## 2015-09-04 DIAGNOSIS — I071 Rheumatic tricuspid insufficiency: Secondary | ICD-10-CM | POA: Insufficient documentation

## 2015-09-04 DIAGNOSIS — N189 Chronic kidney disease, unspecified: Secondary | ICD-10-CM | POA: Insufficient documentation

## 2015-09-04 DIAGNOSIS — I509 Heart failure, unspecified: Secondary | ICD-10-CM | POA: Diagnosis not present

## 2015-09-04 DIAGNOSIS — R600 Localized edema: Secondary | ICD-10-CM | POA: Insufficient documentation

## 2015-09-04 DIAGNOSIS — R0600 Dyspnea, unspecified: Secondary | ICD-10-CM

## 2015-09-04 DIAGNOSIS — I130 Hypertensive heart and chronic kidney disease with heart failure and stage 1 through stage 4 chronic kidney disease, or unspecified chronic kidney disease: Secondary | ICD-10-CM | POA: Insufficient documentation

## 2015-09-04 DIAGNOSIS — I340 Nonrheumatic mitral (valve) insufficiency: Secondary | ICD-10-CM | POA: Insufficient documentation

## 2015-09-11 ENCOUNTER — Other Ambulatory Visit: Payer: Self-pay

## 2015-09-11 MED ORDER — TRAMADOL HCL 50 MG PO TABS: 50.0000 mg | ORAL_TABLET | Freq: Four times a day (QID) | ORAL | Status: DC | PRN

## 2015-09-19 ENCOUNTER — Encounter: Payer: Self-pay | Admitting: Internal Medicine

## 2015-09-19 ENCOUNTER — Ambulatory Visit (INDEPENDENT_AMBULATORY_CARE_PROVIDER_SITE_OTHER): Payer: Medicare Other | Admitting: Internal Medicine

## 2015-09-19 VITALS — BP 112/57 | HR 62 | Ht 66.0 in | Wt 208.8 lb

## 2015-09-19 DIAGNOSIS — R55 Syncope and collapse: Secondary | ICD-10-CM | POA: Diagnosis not present

## 2015-09-19 DIAGNOSIS — I4891 Unspecified atrial fibrillation: Secondary | ICD-10-CM

## 2015-09-19 DIAGNOSIS — R600 Localized edema: Secondary | ICD-10-CM

## 2015-09-19 DIAGNOSIS — I5021 Acute systolic (congestive) heart failure: Secondary | ICD-10-CM

## 2015-09-19 DIAGNOSIS — I951 Orthostatic hypotension: Secondary | ICD-10-CM

## 2015-09-19 NOTE — Patient Instructions (Signed)
Dr. Rennis Golden advises to take midodrine daily about 1 hour prior to morning diuretic dose (bumex).   Your physician wants you to follow-up in: 6 months with Dr. Rennis Golden. You will receive a reminder letter in the mail two months in advance. If you don't receive a letter, please call our office to schedule the follow-up appointment.

## 2015-09-20 DIAGNOSIS — I951 Orthostatic hypotension: Secondary | ICD-10-CM | POA: Insufficient documentation

## 2015-09-20 NOTE — Progress Notes (Signed)
09/20/2015 [name]   January 28, 1927  [id number]    Primary Physicia Rogelia Boga, MD Primary Cardiologist: Dr Rennis Golden  CC: Swimmy headedness, swelling  HPI: 82 y/o with a history of AF. We saw him in Feb 2014 and attempted cardioversion then but he failed to convert. He was put on Amiodarone and cardioverted successfully in April 2014. He has been on Amiodarone and Xarelto. He saw [name] recently with complaints of near syncopal episodes. He describes a "flushed feeling" with weakness. His HR at home is in the 50s. He has noted more of these episodes when he is trying to urinate. [name] had stopped his digoxin and lower his dose of amiodarone to 200 mg daily. Since that time his symptoms have improved significantly, however he has had some episodes of low blood pressure. At that time he was diagnosed with atrial fibrillation his EF was 40%, suspect it may be improved as it may been tachycardia related. He continues on Bumex 1 mg daily for most days of the week and 2 or 3 days a week takes it twice daily. This may however be too much diuretic for him.  He also wore a cardia monitor between 12/06/2012 at 12/19/2012. During the 315 hours of monitoring, there was only sinus rhythm and a short episode of sinus bradycardia at 59 with a first degree AV block and intraventricular conduction delay. No pauses or atrial fibrillation were noted.  [name] returns today and is feeling quite well. He continues to have labile hypotension and is not on any blood pressure medications. I would not add any medications due to the fact that in general his blood pressure since around 100 systolic. He does have higher blood pressures in the morning.  He recently underwent total hip arthroplasty by Dr. Charlann Boxer and has rehabilitated over the past several months. He reports an improvement in his ambulation. He is weight is stable in volume status appears stable. He did have edema after surgery which is improved. He  continues to have an underlying right bundle branch block but is maintaining sinus rhythm on amiodarone. He is intraventricular conduction delay is worse today however at 184 ms for the QRS. This suggests a significant intraventricular conduction delay.  The only offending medication could be amiodarone.   [name] returns today for follow-up. He has been having some dizziness with change in position and presyncope. Recently Dr. Kirtland Bouchard had decreased his Bumex to 0.5 mg twice a day. His daughter seems to think that this is helped with his symptoms. She is interested in decreasing the Bumex to 1/2 mg once daily.   09/19/2015  [name] returns today for follow-up. He has had some increase in swelling and we increased his Bumex dose back to 0.5 mg twice daily. He has had some low blood pressures, particularly low diastolic blood pressures. This may contribute to his swimmy headedness. Blood pressure in the office today was 112/57.  Current Outpatient Prescriptions  Medication Sig Dispense Refill  . acetaminophen (TYLENOL) 500 MG tablet Take 500 mg by mouth 3 (three) times daily. Takes with Tramadol    . albuterol (PROVENTIL HFA;VENTOLIN HFA) 108 (90 BASE) MCG/ACT inhaler Inhale 1-2 puffs into the lungs every 6 (six) hours as needed for wheezing or shortness of breath. 1 Inhaler 3  . amiodarone (PACERONE) 200 MG tablet Take 100 mg by mouth daily.    . bumetanide (BUMEX) 1 MG tablet Take 0.5 tablets (0.5 mg total) by mouth 2 (two) times daily. 30  tablet 3  . Calcium Carbonate-Vitamin D (CALCIUM 600+D) 600-400 MG-UNIT per tablet Take 1 tablet by mouth 3 (three) times a week. Monday Wednesday and Friday    . cetirizine (ZYRTEC) 10 MG tablet Take 10 mg by mouth daily as needed for allergies.    [name] docusate sodium 100 MG CAPS Take 100 mg by mouth 2 (two) times daily. 10 capsule 0  . gabapentin (NEURONTIN) 400 MG capsule TAKE ONE CAPSULE BY MOUTH THREE TIMES DAILY 270 capsule 1  . KLOR-CON M20 20 MEQ tablet TAKE ONE  TABLET BY MOUTH ONCE DAILY 60 tablet 5  . Magnesium 250 MG TABS Take 250 mg by mouth daily.     . midodrine (PROAMATINE) 10 MG tablet Take 1 tablet (10 mg total) by mouth daily as needed (for systolic BP under 90). 30 tablet 0  . Multiple Vitamin (MULTIVITAMIN WITH MINERALS) TABS tablet Take 1 tablet by mouth daily.    . Multiple Vitamins-Minerals (ICAPS AREDS FORMULA PO) Take 2 capsules by mouth 2 (two) times daily.    [name] omeprazole (PRILOSEC) 20 MG capsule Take 1 capsule (20 mg total) by mouth daily. 90 capsule 1  . polyethylene glycol (MIRALAX / GLYCOLAX) packet Take 17 g by mouth 2 (two) times daily. 14 each 0  . potassium chloride SA (K-DUR,KLOR-CON) 20 MEQ tablet Take 10 mEq by mouth daily.    . rivaroxaban (XARELTO) 20 MG TABS tablet Take 1 tablet (20 mg total) by mouth daily. 21 tablet 0  . rosuvastatin (CRESTOR) 20 MG tablet Take 20 mg by mouth once a week. Friday    . senna (SENOKOT) 8.6 MG TABS tablet Take 1 tablet by mouth daily as needed for mild constipation.    [name] SPIRIVA HANDIHALER 18 MCG inhalation capsule PLACE ONE CAPSULE INTO INHALER AND INHALE DAILY 30 capsule 5  . tamsulosin (FLOMAX) 0.4 MG CAPS capsule TAKE ONE CAPSULE BY MOUTH ONCE DAILY 30 capsule 5  . traMADol (ULTRAM) 50 MG tablet Take 1-2 tablets (50-100 mg total) by mouth every 6 (six) hours as needed. 100 tablet 1  . zolpidem (AMBIEN) 5 MG tablet Take 1 tablet (5 mg total) by mouth at bedtime as needed for sleep. 30 tablet 5   No current facility-administered medications for this visit.    Allergies  Allergen Reactions  . Nsaids     Stomach pain  . Other     Opiates cause tightness in chest  . Vicodin [Hydrocodone-Acetaminophen] Other (See Comments)    Unknown reaction many years ago  . Morphine And Related Nausea And Vomiting    Dizziness, light headed     Social History   Social History  . Marital Status: Widowed    Spouse Name: N/A  . Number of Children: N/A  . Years of Education: N/A    Occupational History  . retired    Social History Main Topics  . Smoking status: Former Smoker    Quit date: 05/17/1964  . Smokeless tobacco: Never Used  . Alcohol Use: No  . Drug Use: No  . Sexual Activity: Not Currently   Other Topics Concern  . Not on file   Social History Narrative     Review of Systems: General: negative for chills, fever, night sweats or weight changes.  Cardiovascular: negative for chest pain, dyspnea on exertion, edema, orthopnea, palpitations, paroxysmal nocturnal dyspnea or shortness of breath Dermatological: negative for rash Respiratory: negative for cough or wheezing Urologic: negative for hematuria Abdominal: negative for nausea, vomiting, diarrhea, bright  red blood per rectum, melena, or hematemesis Neurologic: negative for visual changes, syncope, or dizziness All other systems reviewed and are otherwise negative except as noted above.    Blood pressure 112/57, pulse 62, height  (1.676 m), weight 208 lb 12.8 oz (94.711 kg).  General appearance: alert, no distress and pale Neck: no carotid bruit and no JVD Lungs: clear to auscultation bilaterally Heart: regular rate and rhythm Abdomen: soft, non-tender; bowel sounds normal; no masses,  no organomegaly Extremities: edema trace Pulses: 2+ and symmetric Skin: Skin color, texture, turgor normal. No rashes or lesions Neurologic: Mental status: Alert, oriented, thought content appropriate, in wheelchair psych: Mood, affect normal  EKG  Deferred  ASSESSMENT AND PLAN:   Patient Active Problem List   Diagnosis Date Noted  . Orthostatic hypotension 09/20/2015  . Impaired glucose tolerance 11/25/2014  . Bifascicular block 12/18/2013  . Leg edema 10/25/2013  . CHF (congestive heart failure) (HCC) 08/30/2013  . S/P left THA, AA 08/13/2013  . Chronic anticoagulation 12/06/2012  . Near syncope 12/06/2012  . Lower extremity edema 07/05/2012  . Acute systolic CHF (congestive heart  failure), NYHA class 3 -- Unclear etilogy (? Afib related) EF down from 60-70% to 40%. 07/05/2012  . General weakness 07/05/2012  . Paroxysmal Atrial fibrillation - admitted with RVR 01/18/2012  . DJD (degenerative joint disease), lumbar 01/18/2012  . Hx of colonic polyps 11/02/2011  . BENIGN PROSTATIC HYPERTROPHY 05/13/2010  . SEBORRHEA CAPITIS 02/13/2010  . History of elevated glucose 10/01/2009  . CHRONIC RHINITIS 06/02/2009  . Hyperlipidemia 11/01/2008  . NASAL POLYP 07/03/2008  . INSOMNIA, CHRONIC, MILD 01/02/2008  . Essential hypertension 02/06/2007  . ASTHMA 02/06/2007    PLAN   Mr. Kaltenbach Continues to have some labile blood pressures and symptoms consistent with orthostatic hypotension however has whole body volume overload. We cannot further decrease his Lasix. He is not on any blood pressure lowering medications but still has episodes of periodic hypotension. I do think he benefit from midodrine. He's been reluctant to take that on an as-needed basis although he has had symptomatic hypotension. I've advised him to at least consider taking it once a day in the morning. He may need to increase that to twice a day as it's a short acting medication. Plan to see him back in about 6 months.  Chrystie Nose, MD, [facility] Attending Cardiologist [facility]  Lisette Abu [facility] 09/20/2015 12:15 PM

## 2015-10-01 ENCOUNTER — Other Ambulatory Visit: Payer: Self-pay | Admitting: Internal Medicine

## 2015-10-02 NOTE — Telephone Encounter (Signed)
Rx request sent to pharmacy.  

## 2015-12-01 ENCOUNTER — Ambulatory Visit: Payer: Medicare Other | Admitting: Internal Medicine

## 2015-12-01 DIAGNOSIS — Z0289 Encounter for other administrative examinations: Secondary | ICD-10-CM

## 2015-12-04 ENCOUNTER — Other Ambulatory Visit: Payer: Self-pay | Admitting: Internal Medicine

## 2015-12-05 NOTE — Telephone Encounter (Signed)
Pt requesting Tramadol HCL 50mg  #100 Refills: 1  Take One to Two tabs by mouth every 6 hours as needed  Last seen: 06/02/15 Last refilled 09/11/15   Okay to refill?

## 2015-12-11 ENCOUNTER — Other Ambulatory Visit: Payer: Self-pay | Admitting: Internal Medicine

## 2015-12-12 ENCOUNTER — Other Ambulatory Visit: Payer: Self-pay | Admitting: Internal Medicine

## 2015-12-12 NOTE — Telephone Encounter (Signed)
Rx refill sent to pharmacy. 

## 2015-12-12 NOTE — Telephone Encounter (Signed)
Can you resend the rx traMADol (ULTRAM) 50 MG tablet  [facility] Thank you!!!

## 2015-12-12 NOTE — Telephone Encounter (Signed)
pts son calling about Rx Tramdol    Pharm:  [facility]

## 2015-12-17 ENCOUNTER — Other Ambulatory Visit: Payer: Self-pay | Admitting: Internal Medicine

## 2015-12-26 ENCOUNTER — Encounter: Payer: Self-pay | Admitting: Internal Medicine

## 2015-12-26 ENCOUNTER — Ambulatory Visit (INDEPENDENT_AMBULATORY_CARE_PROVIDER_SITE_OTHER): Payer: Medicare Other | Admitting: Internal Medicine

## 2015-12-26 VITALS — BP 130/60 | HR 73 | Temp 98.0°F | Ht 66.0 in | Wt 212.0 lb

## 2015-12-26 DIAGNOSIS — I10 Essential (primary) hypertension: Secondary | ICD-10-CM

## 2015-12-26 DIAGNOSIS — I951 Orthostatic hypotension: Secondary | ICD-10-CM

## 2015-12-26 DIAGNOSIS — R7302 Impaired glucose tolerance (oral): Secondary | ICD-10-CM

## 2015-12-26 DIAGNOSIS — I5020 Unspecified systolic (congestive) heart failure: Secondary | ICD-10-CM | POA: Diagnosis not present

## 2015-12-26 DIAGNOSIS — R600 Localized edema: Secondary | ICD-10-CM

## 2015-12-26 LAB — BASIC METABOLIC PANEL
BUN: 18 mg/dL (ref 6–23)
CO2: 33 meq/L — AB (ref 19–32)
Calcium: 9.5 mg/dL (ref 8.4–10.5)
Chloride: 94 mEq/L — ABNORMAL LOW (ref 96–112)
Creatinine, Ser: 1.03 mg/dL (ref 0.40–1.50)
GFR: 72.66 mL/min (ref >60.00)
GLUCOSE: 95 mg/dL (ref 70–99)
Potassium: 4.9 mEq/L (ref 3.5–5.1)
SODIUM: 133 meq/L — AB (ref 135–145)

## 2015-12-26 NOTE — Progress Notes (Signed)
Pre visit review using our clinic review tool, if applicable. No additional management support is needed unless otherwise documented below in the visit note. 

## 2015-12-26 NOTE — Progress Notes (Signed)
Subjective:    Patient ID: [name], male    DOB: August 26, 1926, 82 y.o.   MRN: [id number]  HPI  82 year old patient who is seen today for his six-month follow-up.  He is followed by cardiology with a history of chronic systolic heart failure.  He has chronic lower extremity edema and this is his chief complaint today.  He is on diuretic therapy but also has a history of orthostatic hypotension.  He has been treated with midodrine by cardiology, which she takes once daily in the morning.  He states his blood pressure readings are usually highest in the morning and has more difficulty later in the afternoon.   Past Medical History:  Diagnosis Date  . A-fib (HCC)   . Arthritis    knees  . Asthma   . BPH (benign prostatic hypertrophy)   . Bronchospasm, exercise-induced   . CHF (congestive heart failure) (HCC)    systolic, EF 40% (07/07/2012)  . Chronic kidney disease    stabilized, due to infection  . Dyslipidemia   . Edema    Bilateral - left lower leg greater than right- wears compression stockings  . GERD (gastroesophageal reflux disease)   . Hypertension   . Macular degeneration 08-06-13   bilateral -sight impaired  . Obesity   . Shortness of breath      Social History   Social History  . Marital status: Widowed    Spouse name: N/A  . Number of children: N/A  . Years of education: N/A   Occupational History  . retired Retired   Social History Main Topics  . Smoking status: Former Smoker    Quit date: 05/17/1964  . Smokeless tobacco: Never Used  . Alcohol use No  . Drug use: No  . Sexual activity: Not Currently   Other Topics Concern  . Not on file   Social History Narrative  . No narrative on file    Past Surgical History:  Procedure Laterality Date  . CARDIOVERSION N/A 07/07/2012   Procedure: CARDIOVERSION;  Surgeon: Chrystie Nose, MD;  Location: [facility] ENDOSCOPY;  Service: Cardiovascular;  Laterality: N/A;  . CARDIOVERSION N/A 08/30/2012   Procedure:  CARDIOVERSION;  Surgeon: Chrystie Nose, MD;  Location: [facility] ENDOSCOPY;  Service: Cardiovascular;  Laterality: N/A;  . TOTAL HIP ARTHROPLASTY Left 08/13/2013   Procedure: LEFT TOTAL HIP ARTHROPLASTY ANTERIOR APPROACH;  Surgeon: [name], MD;  Location: [facility] ORS;  Service: Orthopedics;  Laterality: Left;  . TRANSTHORACIC ECHOCARDIOGRAM  07/07/2012   ef 40%; mild MR; LA mod-severely dilated; RA mildly dilated; RV systolic function mildly reduced; RV systolic pressure increase - mod pulm htn  . VASCULAR SURGERY  removed varicose veins  . VASECTOMY      Family History  Problem Relation Age of Onset  . Alzheimer's disease Mother   . Heart disease Father     Allergies  Allergen Reactions  . Nsaids     Stomach pain  . Other     Opiates cause tightness in chest  . Vicodin [Hydrocodone-Acetaminophen] Other (See Comments)    Unknown reaction many years ago  . Morphine And Related Nausea And Vomiting    Dizziness, light headed     Current Outpatient Prescriptions on File Prior to Visit  Medication Sig Dispense Refill  . acetaminophen (TYLENOL) 500 MG tablet Take 500 mg by mouth 3 (three) times daily. Takes with Tramadol    . albuterol (PROVENTIL HFA;VENTOLIN HFA) 108 (90 BASE) MCG/ACT inhaler Inhale 1-2 puffs  into the lungs every 6 (six) hours as needed for wheezing or shortness of breath. 1 Inhaler 3  . bumetanide (BUMEX) 1 MG tablet Take 0.5 tablets (0.5 mg total) by mouth 2 (two) times daily. 30 tablet 3  . Calcium Carbonate-Vitamin D (CALCIUM 600+D) 600-400 MG-UNIT per tablet Take 1 tablet by mouth 3 (three) times a week. Monday Wednesday and Friday    . cetirizine (ZYRTEC) 10 MG tablet Take 10 mg by mouth daily as needed for allergies.    [name] docusate sodium 100 MG CAPS Take 100 mg by mouth 2 (two) times daily. 10 capsule 0  . gabapentin (NEURONTIN) 400 MG capsule TAKE ONE CAPSULE BY MOUTH THREE TIMES DAILY 270 capsule 1  . KLOR-CON M20 20 MEQ tablet TAKE ONE TABLET BY MOUTH ONCE DAILY  60 tablet 5  . Magnesium 250 MG TABS Take 250 mg by mouth daily.     . midodrine (PROAMATINE) 10 MG tablet Take 1 tablet (10 mg total) by mouth daily as needed. For Systolic blood pressure (top number) under 90* 30 tablet 6  . Multiple Vitamin (MULTIVITAMIN WITH MINERALS) TABS tablet Take 1 tablet by mouth daily.    . Multiple Vitamins-Minerals (ICAPS AREDS FORMULA PO) Take 2 capsules by mouth 2 (two) times daily.    [name] omeprazole (PRILOSEC) 20 MG capsule Take 1 capsule (20 mg total) by mouth daily. 90 capsule 1  . PACERONE 200 MG tablet TAKE ONE TABLET BY MOUTH ONCE DAILY 30 tablet 6  . polyethylene glycol (MIRALAX / GLYCOLAX) packet Take 17 g by mouth 2 (two) times daily. 14 each 0  . potassium chloride SA (K-DUR,KLOR-CON) 20 MEQ tablet Take 10 mEq by mouth daily.    . rivaroxaban (XARELTO) 20 MG TABS tablet Take 1 tablet (20 mg total) by mouth daily. 21 tablet 0  . rosuvastatin (CRESTOR) 20 MG tablet Take 20 mg by mouth once a week. Friday    . senna (SENOKOT) 8.6 MG TABS tablet Take 1 tablet by mouth daily as needed for mild constipation.    [name] SPIRIVA HANDIHALER 18 MCG inhalation capsule PLACE ONE CAPSULE INTO INHALER AND INHALE DAILY BY MOUTH 30 capsule 0  . tamsulosin (FLOMAX) 0.4 MG CAPS capsule TAKE ONE CAPSULE BY MOUTH ONCE DAILY 30 capsule 5  . traMADol (ULTRAM) 50 MG tablet TAKE ONE TO TWO TABLETS BY MOUTH EVERY 6 HOURS AS NEEDED 100 tablet 0  . zolpidem (AMBIEN) 5 MG tablet Take 1 tablet (5 mg total) by mouth at bedtime as needed for sleep. 30 tablet 5   No current facility-administered medications on file prior to visit.     BP 130/60 (BP Location: Right Arm, Patient Position: Sitting, Cuff Size: Normal)   Pulse 73   Temp 98 F (36.7 C) (Oral)   Ht 5\' 6"  (1.676 m)   Wt 212 lb (96.2 kg)   SpO2 98%   BMI 34.22 kg/m     Review of Systems  Constitutional: Negative for appetite change, chills, fatigue and fever.  HENT: Negative for congestion, dental problem, ear pain,  hearing loss, sore throat, tinnitus, trouble swallowing and voice change.   Eyes: Negative for pain, discharge and visual disturbance.  Respiratory: Negative for cough, chest tightness, wheezing and stridor.   Cardiovascular: Positive for leg swelling. Negative for chest pain and palpitations.  Gastrointestinal: Negative for abdominal distention, abdominal pain, blood in stool, constipation, diarrhea, nausea and vomiting.  Genitourinary: Negative for difficulty urinating, discharge, flank pain, genital sores, hematuria and urgency.  Musculoskeletal: Negative for arthralgias, back pain, gait problem, joint swelling, myalgias and neck stiffness.  Skin: Negative for rash.  Neurological: Positive for dizziness. Negative for syncope, speech difficulty, weakness, numbness and headaches.  Hematological: Negative for adenopathy. Does not bruise/bleed easily.  Psychiatric/Behavioral: Negative for behavioral problems and dysphoric mood. The patient is not nervous/anxious.        Objective:   Physical Exam  Constitutional: He is oriented to person, place, and time. He appears well-developed.  Blood pressure 110/70  HENT:  Head: Normocephalic.  Right Ear: External ear normal.  Left Ear: External ear normal.  Eyes: Conjunctivae and EOM are normal.  Neck: Normal range of motion.  Cardiovascular: Normal rate and normal heart sounds.   Grade 2/6 systolic murmur Pulse slightly irregular  Pulmonary/Chest: He has rales.  Rales involving the lower 1 third lung fields  Abdominal: Bowel sounds are normal.  Musculoskeletal: Normal range of motion. He exhibits edema. He exhibits no tenderness.  Prominent lower extremity edema with TED stockings in place  Neurological: He is alert and oriented to person, place, and time.  Psychiatric: He has a normal mood and affect. His behavior is normal.          Assessment & Plan:   Chronic systolic heart failure Essential hypertension History of orthostatic  hypotension.  Patient has been asked to take midodrine after lunch.  Hypotension appears to be more prominent in the mid afternoon Chronic lower extremity edema.  Low-salt diet, elevation encouraged.  No change in diuretic therapy.  We'll check electrolytes. Chronic systolic heart failure.  Follow-up cardiology  Rogelia Boga, MD

## 2015-12-26 NOTE — Patient Instructions (Signed)
Limit your sodium (Salt) intake  Keep your legs elevated as much as possible  Continue home blood pressure monitoring and frequent weights  Notify of any significant weight gain or worsening lower extremity edema  Cardiology follow-up as scheduled  Return here 6 months or as needed

## 2015-12-31 ENCOUNTER — Other Ambulatory Visit: Payer: Self-pay | Admitting: Internal Medicine

## 2016-01-08 ENCOUNTER — Other Ambulatory Visit: Payer: Self-pay | Admitting: Internal Medicine

## 2016-01-08 NOTE — Telephone Encounter (Signed)
Last refill 12-12-2015 #100 Last OV 12-26-2015 No pending scheduled Please advise

## 2016-01-09 NOTE — Telephone Encounter (Signed)
Pt need new Rx for tramadol (son states that pt has been taking 3 a day with a tylenol per Dr. Kirtland Bouchard)  Pharm:  [facility]

## 2016-01-11 ENCOUNTER — Other Ambulatory Visit: Payer: Self-pay | Admitting: Internal Medicine

## 2016-01-28 ENCOUNTER — Other Ambulatory Visit: Payer: Self-pay | Admitting: Internal Medicine

## 2016-02-20 ENCOUNTER — Other Ambulatory Visit: Payer: Self-pay | Admitting: Internal Medicine

## 2016-02-20 NOTE — Telephone Encounter (Signed)
rx spirivia 18 mcg sent to pharmacy. Patient needs OV for further evaluation on medication

## 2016-03-18 ENCOUNTER — Other Ambulatory Visit: Payer: Self-pay | Admitting: Internal Medicine

## 2016-04-07 ENCOUNTER — Ambulatory Visit (INDEPENDENT_AMBULATORY_CARE_PROVIDER_SITE_OTHER): Payer: Medicare Other | Admitting: Internal Medicine

## 2016-04-07 VITALS — BP 102/50 | HR 74 | Ht 66.0 in | Wt 213.0 lb

## 2016-04-07 DIAGNOSIS — I4891 Unspecified atrial fibrillation: Secondary | ICD-10-CM

## 2016-04-07 DIAGNOSIS — I10 Essential (primary) hypertension: Secondary | ICD-10-CM

## 2016-04-07 DIAGNOSIS — I5021 Acute systolic (congestive) heart failure: Secondary | ICD-10-CM | POA: Diagnosis not present

## 2016-04-07 DIAGNOSIS — I951 Orthostatic hypotension: Secondary | ICD-10-CM

## 2016-04-07 MED ORDER — ROSUVASTATIN CALCIUM 20 MG PO TABS: 20.0000 mg | ORAL_TABLET | ORAL | 2 refills | Status: DC

## 2016-04-07 MED ORDER — BUMETANIDE 1 MG PO TABS: ORAL_TABLET | ORAL | 3 refills | Status: DC

## 2016-04-07 NOTE — Patient Instructions (Addendum)
Medication Instructions:  INCREASE Bumex Take 1 tablet in the morning and half tablet in the evening daily  Labwork: Your physician recommends that you return for lab work in: NEXT WEEK BMET, BNP  Testing/Procedures: None  Follow-Up: Your physician recommends that you schedule a follow-up appointment in: 1 Month with DR HILTY  Any Other Special Instructions Will Be Listed Below (If Applicable).     If you need a refill on your cardiac medications before your next appointment, please call your pharmacy.

## 2016-04-07 NOTE — Progress Notes (Signed)
04/07/2016 [name]   1926/11/08  [id number]    Primary Physicia Rogelia BogaKWIATKOWSKI,PETER FRANK, MD Primary Cardiologist: Dr Rennis GoldenHilty  CC: Swimmy headedness, worsening edema  HPI: 82 y/o with a history of AF. We saw him in Feb 2014 and attempted cardioversion then but he failed to convert. He was put on Amiodarone and cardioverted successfully in April 2014. He has been on Amiodarone and Xarelto. He saw Corine ShelterLuke [name] recently with complaints of near syncopal episodes. He describes a "flushed feeling" with weakness. His HR at home is in the 50s. He has noted more of these episodes when he is trying to urinate. [name] had stopped his digoxin and lower his dose of amiodarone to 200 mg daily. Since that time his symptoms have improved significantly, however he has had some episodes of low blood pressure. At that time he was diagnosed with atrial fibrillation his EF was 40%, suspect it may be improved as it may been tachycardia related. He continues on Bumex 1 mg daily for most days of the week and 2 or 3 days a week takes it twice daily. This may however be too much diuretic for him.  He also wore a cardia monitor between 12/06/2012 at 12/19/2012. During the 315 hours of monitoring, there was only sinus rhythm and a short episode of sinus bradycardia at 59 with a first degree AV block and intraventricular conduction delay. No pauses or atrial fibrillation were noted.  [name] returns today and is feeling quite well. He continues to have labile hypotension and is not on any blood pressure medications. I would not add any medications due to the fact that in general his blood pressure since around 100 systolic. He does have higher blood pressures in the morning.  He recently underwent total hip arthroplasty by Dr. Charlann Boxerlin and has rehabilitated over the past several months. He reports an improvement in his ambulation. He is weight is stable in volume status appears stable. He did have edema after surgery which is  improved. He continues to have an underlying right bundle branch block but is maintaining sinus rhythm on amiodarone. He is intraventricular conduction delay is worse today however at 184 ms for the QRS. This suggests a significant intraventricular conduction delay.  The only offending medication could be amiodarone.   [name] returns today for follow-up. He has been having some dizziness with change in position and presyncope. Recently Dr. Kirtland BouchardK had decreased his Bumex to 0.5 mg twice a day. His daughter seems to think that this is helped with his symptoms. She is interested in decreasing the Bumex to 1/2 mg once daily.   09/19/2015  [name] returns today for follow-up. He has had some increase in swelling and we increased his Bumex dose back to 0.5 mg twice daily. He has had some low blood pressures, particularly low diastolic blood pressures. This may contribute to his swimmy headedness. Blood pressure in the office today was 112/57.  04/06/2016  [name] was seen back in the office today for follow-up. He reports that in general he's doing fairly well and needs to take his midodrine once or twice a week. He is on a 0.5 mg twice daily. He has had about 4-5 pound weight gain since I last saw him. He reports some more swelling in his thighs and abdomen. He denies any worsening shortness of breath. Blood pressure is a little more stable than it had been. He also is having some autonomic episodes including periodic hot flashes.  Current  Outpatient Prescriptions  Medication Sig Dispense Refill  . acetaminophen (TYLENOL) 500 MG tablet Take 500 mg by mouth 3 (three) times daily. Takes with Tramadol    . albuterol (PROVENTIL HFA;VENTOLIN HFA) 108 (90 BASE) MCG/ACT inhaler Inhale 1-2 puffs into the lungs every 6 (six) hours as needed for wheezing or shortness of breath. 1 Inhaler 3  . bumetanide (BUMEX) 1 MG tablet TAKE ONE-HALF TABLET BY MOUTH TWICE DAILY 30 tablet 3  . Calcium Carbonate-Vitamin D (CALCIUM  600+D) 600-400 MG-UNIT per tablet Take 1 tablet by mouth 3 (three) times a week. Monday Wednesday and Friday    . cetirizine (ZYRTEC) 10 MG tablet Take 10 mg by mouth daily as needed for allergies.    [name] docusate sodium 100 MG CAPS Take 100 mg by mouth 2 (two) times daily. 10 capsule 0  . gabapentin (NEURONTIN) 400 MG capsule TAKE ONE CAPSULE BY MOUTH THREE TIMES DAILY 270 capsule 1  . Magnesium 250 MG TABS Take 250 mg by mouth daily.     . midodrine (PROAMATINE) 10 MG tablet Take 1 tablet (10 mg total) by mouth daily as needed. For Systolic blood pressure (top number) under 90* 30 tablet 6  . Multiple Vitamin (MULTIVITAMIN WITH MINERALS) TABS tablet Take 1 tablet by mouth daily.    . Multiple Vitamins-Minerals (ICAPS AREDS FORMULA PO) Take 2 capsules by mouth 2 (two) times daily.    [name] omeprazole (PRILOSEC) 20 MG capsule Take 1 capsule (20 mg total) by mouth daily. 90 capsule 1  . PACERONE 200 MG tablet TAKE ONE TABLET BY MOUTH ONCE DAILY 30 tablet 6  . polyethylene glycol (MIRALAX / GLYCOLAX) packet Take 17 g by mouth 2 (two) times daily. 14 each 0  . potassium chloride SA (K-DUR,KLOR-CON) 20 MEQ tablet Take 10 mEq by mouth daily.    . rivaroxaban (XARELTO) 20 MG TABS tablet Take 1 tablet (20 mg total) by mouth daily. 21 tablet 0  . rosuvastatin (CRESTOR) 20 MG tablet Take 20 mg by mouth once a week. Friday    . senna (SENOKOT) 8.6 MG TABS tablet Take 1 tablet by mouth daily as needed for mild constipation.    [name] SPIRIVA HANDIHALER 18 MCG inhalation capsule INHALE ONE PUFF BY MOUTH ONCE DAILY 90 capsule 3  . tamsulosin (FLOMAX) 0.4 MG CAPS capsule TAKE ONE CAPSULE BY MOUTH ONCE DAILY 30 capsule 5  . traMADol (ULTRAM) 50 MG tablet TAKE ONE TO TWO TABLETS BY MOUTH EVERY 6 HOURS AS NEEDED 100 tablet 2  . zolpidem (AMBIEN) 5 MG tablet Take 1 tablet (5 mg total) by mouth at bedtime as needed for sleep. 30 tablet 5   No current facility-administered medications for this visit.     Allergies    Allergen Reactions  . Nsaids     Stomach pain  . Other     Opiates cause tightness in chest  . Vicodin [Hydrocodone-Acetaminophen] Other (See Comments)    Unknown reaction many years ago  . Morphine And Related Nausea And Vomiting    Dizziness, light headed     Social History   Social History  . Marital status: Widowed    Spouse name: N/A  . Number of children: N/A  . Years of education: N/A   Occupational History  . retired Retired   Social History Main Topics  . Smoking status: Former Smoker    Quit date: 05/17/1964  . Smokeless tobacco: Never Used  . Alcohol use No  . Drug use: No  .  Sexual activity: Not Currently   Other Topics Concern  . Not on file   Social History Narrative  . No narrative on file     Review of Systems: General: negative for chills, fever, night sweats or weight changes.  Cardiovascular: negative for chest pain, dyspnea on exertion, edema, orthopnea, palpitations, paroxysmal nocturnal dyspnea or shortness of breath Dermatological: negative for rash Respiratory: negative for cough or wheezing Urologic: negative for hematuria Abdominal: negative for nausea, vomiting, diarrhea, bright red blood per rectum, melena, or hematemesis Neurologic: negative for visual changes, syncope, or dizziness All other systems reviewed and are otherwise negative except as noted above.    Blood pressure (!) 102/50, pulse 74, height 5\' 6"  (1.676 m), weight 213 lb (96.6 kg).  General appearance: alert, no distress and pale Neck: no carotid bruit and no JVD Lungs: clear to auscultation bilaterally Heart: regular rate and rhythm Abdomen: soft, non-tender; bowel sounds normal; no masses,  no organomegaly Extremities: edema trace Pulses: 2+ and symmetric Skin: Skin color, texture, turgor normal. No rashes or lesions Neurologic: Mental status: Alert, oriented, thought content appropriate, in wheelchair psych: Mood, affect normal  EKG  Normal sinus rhythm at 74,  bifascicular block, lateral T-wave changes  ASSESSMENT AND PLAN:   Patient Active Problem List   Diagnosis Date Noted  . Orthostatic hypotension 09/20/2015  . Impaired glucose tolerance 11/25/2014  . Bifascicular block 12/18/2013  . Leg edema 10/25/2013  . CHF (congestive heart failure) (HCC) 08/30/2013  . S/P left THA, AA 08/13/2013  . Chronic anticoagulation 12/06/2012  . Near syncope 12/06/2012  . Lower extremity edema 07/05/2012  . Acute systolic CHF (congestive heart failure), NYHA class 3 -- Unclear etilogy (? Afib related) EF down from 60-70% to 40%. 07/05/2012  . General weakness 07/05/2012  . Paroxysmal Atrial fibrillation - admitted with RVR 01/18/2012  . DJD (degenerative joint disease), lumbar 01/18/2012  . Hx of colonic polyps 11/02/2011  . BENIGN PROSTATIC HYPERTROPHY 05/13/2010  . SEBORRHEA CAPITIS 02/13/2010  . History of elevated glucose 10/01/2009  . CHRONIC RHINITIS 06/02/2009  . Hyperlipidemia 11/01/2008  . NASAL POLYP 07/03/2008  . INSOMNIA, CHRONIC, MILD 01/02/2008  . Essential hypertension 02/06/2007  . ASTHMA 02/06/2007    PLAN   [name] Is doing fairly well from a lack of presyncopal episode standpoint. Blood pressure is still labile and he needs to take the midodrine once or twice a week. He appears to be getting some extra fluid. Weight is up and there is thigh and abdominal edema with faint basilar crackles in the left base. I recommend increasing his Bumex to 1 mg every morning and 0.5 mg every afternoon. He will need a repeat metabolic profile and BMP next week. Plan to see him back in about a month to reassess his progress on this. We did discuss the fact that this could worsen preload and may cause some swings in his blood pressure. He is advised to consider taking the midodrine on a daily basis in the morning approximately 1-2 hours prior to his diuretic dose.   Chrystie Nose, MD, [facility] Attending Cardiologist [facility]  Lisette Abu  [facility] 04/07/2016 9:16 AM

## 2016-04-27 LAB — BASIC METABOLIC PANEL
BUN: 19 mg/dL (ref 7–25)
CO2: 33 mmol/L — ABNORMAL HIGH (ref 20–31)
Calcium: 9.2 mg/dL (ref 8.6–10.3)
Chloride: 101 mmol/L (ref 98–110)
Creat: 1.11 mg/dL (ref 0.70–1.11)
Glucose, Bld: 88 mg/dL (ref 65–99)
POTASSIUM: 4.3 mmol/L (ref 3.5–5.3)
SODIUM: 139 mmol/L (ref 135–146)

## 2016-04-27 LAB — BRAIN NATRIURETIC PEPTIDE: BRAIN NATRIURETIC PEPTIDE: 89.3 pg/mL (ref <100)

## 2016-04-28 ENCOUNTER — Other Ambulatory Visit: Payer: Self-pay | Admitting: Internal Medicine

## 2016-04-30 ENCOUNTER — Other Ambulatory Visit: Payer: Self-pay

## 2016-04-30 NOTE — Telephone Encounter (Signed)
Okay to refill? 

## 2016-05-04 ENCOUNTER — Other Ambulatory Visit: Payer: Self-pay | Admitting: *Deleted

## 2016-05-04 MED ORDER — TRAMADOL HCL 50 MG PO TABS: 50.0000 mg | ORAL_TABLET | Freq: Four times a day (QID) | ORAL | 2 refills | Status: DC | PRN

## 2016-05-12 ENCOUNTER — Encounter: Payer: Self-pay | Admitting: Internal Medicine

## 2016-05-20 ENCOUNTER — Ambulatory Visit: Payer: Medicare Other | Admitting: Internal Medicine

## 2016-05-21 ENCOUNTER — Encounter: Payer: Self-pay | Admitting: Internal Medicine

## 2016-05-21 ENCOUNTER — Telehealth: Payer: Self-pay | Admitting: *Deleted

## 2016-05-21 NOTE — Telephone Encounter (Signed)
Medication samples have been provided to the patient.  Drug name: Xarelto 20mg   Qty: 14 tablets LOT: [id number]  Exp.Date: 04/20  Samples left at front desk for patient pick-up. Patient notified.  Hasana Alcorta A Kyiesha Millward 10:57 AM 05/21/2016

## 2016-05-28 ENCOUNTER — Ambulatory Visit (INDEPENDENT_AMBULATORY_CARE_PROVIDER_SITE_OTHER): Payer: Medicare Other | Admitting: Internal Medicine

## 2016-05-28 ENCOUNTER — Encounter: Payer: Self-pay | Admitting: Internal Medicine

## 2016-05-28 VITALS — BP 101/57 | HR 69 | Ht 67.0 in | Wt 210.6 lb

## 2016-05-28 DIAGNOSIS — I4891 Unspecified atrial fibrillation: Secondary | ICD-10-CM | POA: Diagnosis not present

## 2016-05-28 DIAGNOSIS — I5032 Chronic diastolic (congestive) heart failure: Secondary | ICD-10-CM | POA: Insufficient documentation

## 2016-05-28 DIAGNOSIS — R600 Localized edema: Secondary | ICD-10-CM | POA: Diagnosis not present

## 2016-05-28 DIAGNOSIS — I5022 Chronic systolic (congestive) heart failure: Secondary | ICD-10-CM

## 2016-05-28 DIAGNOSIS — I10 Essential (primary) hypertension: Secondary | ICD-10-CM

## 2016-05-28 NOTE — Progress Notes (Signed)
05/28/2016 [name]   [birth year]  [id number]    Primary Physicia Rogelia Boga, MD Primary Cardiologist: Dr Rennis Golden  CC: No concerns  HPI: 83 y/o with a history of AF. We saw him in Feb 2014 and attempted cardioversion then but he failed to convert. He was put on Amiodarone and cardioverted successfully in April 2014. He has been on Amiodarone and Xarelto. He saw [name] recently with complaints of near syncopal episodes. He describes a "flushed feeling" with weakness. His HR at home is in the 50s. He has noted more of these episodes when he is trying to urinate. [name] had stopped his digoxin and lower his dose of amiodarone to 200 mg daily. Since that time his symptoms have improved significantly, however he has had some episodes of low blood pressure. At that time he was diagnosed with atrial fibrillation his EF was 40%, suspect it may be improved as it may been tachycardia related. He continues on Bumex 1 mg daily for most days of the week and 2 or 3 days a week takes it twice daily. This may however be too much diuretic for him.  He also wore a cardia monitor between 12/06/2012 at 12/19/2012. During the 315 hours of monitoring, there was only sinus rhythm and a short episode of sinus bradycardia at 59 with a first degree AV block and intraventricular conduction delay. No pauses or atrial fibrillation were noted.  [name] returns today and is feeling quite well. He continues to have labile hypotension and is not on any blood pressure medications. I would not add any medications due to the fact that in general his blood pressure since around 100 systolic. He does have higher blood pressures in the morning.  He recently underwent total hip arthroplasty by Dr. Charlann Boxer and has rehabilitated over the past several months. He reports an improvement in his ambulation. He is weight is stable in volume status appears stable. He did have edema after surgery which is improved. He continues to have  an underlying right bundle branch block but is maintaining sinus rhythm on amiodarone. He is intraventricular conduction delay is worse today however at 184 ms for the QRS. This suggests a significant intraventricular conduction delay.  The only offending medication could be amiodarone.   [name] returns today for follow-up. He has been having some dizziness with change in position and presyncope. Recently Dr. Kirtland Bouchard had decreased his Bumex to 0.5 mg twice a day. His daughter seems to think that this is helped with his symptoms. She is interested in decreasing the Bumex to 1/2 mg once daily.   09/19/2015  [name] returns today for follow-up. He has had some increase in swelling and we increased his Bumex dose back to 0.5 mg twice daily. He has had some low blood pressures, particularly low diastolic blood pressures. This may contribute to his swimmy headedness. Blood pressure in the office today was 112/57.  04/06/2016  [name] was seen back in the office today for follow-up. He reports that in general he's doing fairly well and needs to take his midodrine once or twice a week. He is on a 0.5 mg twice daily. He has had about 4-5 pound weight gain since I last saw him. He reports some more swelling in his thighs and abdomen. He denies any worsening shortness of breath. Blood pressure is a little more stable than it had been. He also is having some autonomic episodes including periodic hot flashes.  05/28/2016  Mr.  [name] returns today for follow-up. He seems to be doing fairly well on his current dose of diuretics. Weight is down somewhat and swelling is very well controlled. Recent BNP was less than 100. He has not had any significant orthostasis. He continues to take midodrine daily.  Current Outpatient Prescriptions  Medication Sig Dispense Refill  . acetaminophen (TYLENOL) 500 MG tablet Take 500 mg by mouth 3 (three) times daily. Takes with Tramadol    . albuterol (PROVENTIL HFA;VENTOLIN HFA) 108 (90  BASE) MCG/ACT inhaler Inhale 1-2 puffs into the lungs every 6 (six) hours as needed for wheezing or shortness of breath. 1 Inhaler 3  . bumetanide (BUMEX) 1 MG tablet Take 1 tablet in the morning and half tab in the evening 45 tablet 3  . Calcium Carbonate-Vitamin D (CALCIUM 600+D) 600-400 MG-UNIT per tablet Take 1 tablet by mouth 3 (three) times a week. Monday Wednesday and Friday    . cetirizine (ZYRTEC) 10 MG tablet Take 10 mg by mouth daily as needed for allergies.    [name] docusate sodium 100 MG CAPS Take 100 mg by mouth 2 (two) times daily. 10 capsule 0  . gabapentin (NEURONTIN) 400 MG capsule TAKE ONE CAPSULE BY MOUTH THREE TIMES DAILY 270 capsule 1  . Magnesium 250 MG TABS Take 250 mg by mouth daily.     . midodrine (PROAMATINE) 10 MG tablet Take 1 tablet (10 mg total) by mouth daily as needed. For Systolic blood pressure (top number) under 90* 30 tablet 6  . Multiple Vitamin (MULTIVITAMIN WITH MINERALS) TABS tablet Take 1 tablet by mouth daily.    . Multiple Vitamins-Minerals (ICAPS AREDS FORMULA PO) Take 2 capsules by mouth 2 (two) times daily.    [name] omeprazole (PRILOSEC) 20 MG capsule Take 1 capsule (20 mg total) by mouth daily. 90 capsule 1  . PACERONE 200 MG tablet TAKE ONE TABLET BY MOUTH ONCE DAILY 30 tablet 6  . polyethylene glycol (MIRALAX / GLYCOLAX) packet Take 17 g by mouth 2 (two) times daily. 14 each 0  . potassium chloride SA (K-DUR,KLOR-CON) 20 MEQ tablet Take 10 mEq by mouth daily.    . rivaroxaban (XARELTO) 20 MG TABS tablet Take 1 tablet (20 mg total) by mouth daily. 21 tablet 0  . rosuvastatin (CRESTOR) 20 MG tablet Take 1 tablet (20 mg total) by mouth once a week. Friday 12 tablet 2  . senna (SENOKOT) 8.6 MG TABS tablet Take 1 tablet by mouth daily as needed for mild constipation.    [name] SPIRIVA HANDIHALER 18 MCG inhalation capsule INHALE ONE PUFF BY MOUTH ONCE DAILY 90 capsule 3  . tamsulosin (FLOMAX) 0.4 MG CAPS capsule TAKE ONE CAPSULE BY MOUTH ONCE DAILY 30 capsule 5  .  traMADol (ULTRAM) 50 MG tablet Take 1-2 tablets (50-100 mg total) by mouth every 6 (six) hours as needed. 100 tablet 2  . zolpidem (AMBIEN) 5 MG tablet Take 1 tablet (5 mg total) by mouth at bedtime as needed for sleep. 30 tablet 5   No current facility-administered medications for this visit.     Allergies  Allergen Reactions  . Nsaids     Stomach pain  . Other     Opiates cause tightness in chest  . Vicodin [Hydrocodone-Acetaminophen] Other (See Comments)    Unknown reaction many years ago  . Morphine And Related Nausea And Vomiting    Dizziness, light headed     Social History   Social History  . Marital status: Widowed  Spouse name: N/A  . Number of children: N/A  . Years of education: N/A   Occupational History  . retired Retired   Social History Main Topics  . Smoking status: Former Smoker    Quit date: 05/17/1964  . Smokeless tobacco: Never Used  . Alcohol use No  . Drug use: No  . Sexual activity: Not Currently   Other Topics Concern  . Not on file   Social History Narrative  . No narrative on file     Review of Systems: General: negative for chills, fever, night sweats or weight changes.  Cardiovascular: negative for chest pain, dyspnea on exertion, edema, orthopnea, palpitations, paroxysmal nocturnal dyspnea or shortness of breath Dermatological: negative for rash Respiratory: negative for cough or wheezing Urologic: negative for hematuria Abdominal: negative for nausea, vomiting, diarrhea, bright red blood per rectum, melena, or hematemesis Neurologic: negative for visual changes, syncope, or dizziness All other systems reviewed and are otherwise negative except as noted above.    Blood pressure (!) 101/57, pulse 69, height 5\' 7"  (1.702 m), weight 210 lb 9.6 oz (95.5 kg).  Deferred  EKG  Deferred  ASSESSMENT AND PLAN:   Patient Active Problem List   Diagnosis Date Noted  . Orthostatic hypotension 09/20/2015  . Impaired glucose tolerance  11/25/2014  . Bifascicular block 12/18/2013  . Leg edema 10/25/2013  . CHF (congestive heart failure) (HCC) 08/30/2013  . S/P left THA, AA 08/13/2013  . Chronic anticoagulation 12/06/2012  . Near syncope 12/06/2012  . Lower extremity edema 07/05/2012  . Acute systolic CHF (congestive heart failure), NYHA class 3 -- Unclear etilogy (? Afib related) EF down from 60-70% to 40%. 07/05/2012  . General weakness 07/05/2012  . Paroxysmal Atrial fibrillation - admitted with RVR 01/18/2012  . DJD (degenerative joint disease), lumbar 01/18/2012  . Hx of colonic polyps 11/02/2011  . BENIGN PROSTATIC HYPERTROPHY 05/13/2010  . SEBORRHEA CAPITIS 02/13/2010  . History of elevated glucose 10/01/2009  . CHRONIC RHINITIS 06/02/2009  . Hyperlipidemia 11/01/2008  . NASAL POLYP 07/03/2008  . INSOMNIA, CHRONIC, MILD 01/02/2008  . Essential hypertension 02/06/2007  . ASTHMA 02/06/2007    PLAN   [name] seems to be doing well on his current dose of diuretics. He is not had any problems with orthostasis and blood pressure is well-controlled today. He should remain on daily midodrine. Plan follow-up in 3 months or sooner as necessary.  Chrystie Nose, MD, [facility] Attending Cardiologist [facility]  Lisette Abu [facility] 05/28/2016 5:16 PM

## 2016-05-28 NOTE — Patient Instructions (Addendum)
Your physician wants you to follow-up in: 3 months with Dr. Hilty. You will receive a reminder letter in the mail two months in advance. If you don't receive a letter, please call our office to schedule the follow-up appointment.  

## 2016-06-25 ENCOUNTER — Other Ambulatory Visit: Payer: Self-pay | Admitting: Internal Medicine

## 2016-07-07 ENCOUNTER — Other Ambulatory Visit: Payer: Self-pay | Admitting: Internal Medicine

## 2016-07-23 ENCOUNTER — Ambulatory Visit (INDEPENDENT_AMBULATORY_CARE_PROVIDER_SITE_OTHER): Payer: Medicare Other | Admitting: Internal Medicine

## 2016-07-23 ENCOUNTER — Encounter: Payer: Self-pay | Admitting: Internal Medicine

## 2016-07-23 VITALS — BP 120/60 | HR 73 | Temp 97.7°F | Ht 67.0 in | Wt 212.6 lb

## 2016-07-23 DIAGNOSIS — E785 Hyperlipidemia, unspecified: Secondary | ICD-10-CM | POA: Diagnosis not present

## 2016-07-23 DIAGNOSIS — I10 Essential (primary) hypertension: Secondary | ICD-10-CM | POA: Diagnosis not present

## 2016-07-23 DIAGNOSIS — R7302 Impaired glucose tolerance (oral): Secondary | ICD-10-CM

## 2016-07-23 DIAGNOSIS — I5020 Unspecified systolic (congestive) heart failure: Secondary | ICD-10-CM

## 2016-07-23 LAB — BASIC METABOLIC PANEL
BUN: 14 mg/dL (ref 6–23)
CHLORIDE: 99 meq/L (ref 96–112)
CO2: 36 meq/L — AB (ref 19–32)
CREATININE: 0.99 mg/dL (ref 0.40–1.50)
Calcium: 9.3 mg/dL (ref 8.4–10.5)
GFR: 75.96 mL/min (ref >60.00)
GLUCOSE: 99 mg/dL (ref 70–99)
Potassium: 4 mEq/L (ref 3.5–5.1)
Sodium: 141 mEq/L (ref 135–145)

## 2016-07-23 NOTE — Patient Instructions (Signed)
Limit your sodium (Salt) intake  Follow-up cardiology as scheduled  Return in 6 months for follow-up

## 2016-07-23 NOTE — Progress Notes (Signed)
Pre visit review using our clinic review tool, if applicable. No additional management support is needed unless otherwise documented below in the visit note. 

## 2016-07-23 NOTE — Progress Notes (Signed)
Subjective:    Patient ID: [name], male    DOB: 26-Aug-1926, 83 y.o.   MRN: [id number]  HPI 83 year old patient who is seen today for follow-up.  He is followed closely by cardiology and does have a history of chronic systolic heart failure.  He has chronic lower extremity edema that more recently has been fairly stable. He has had some issues with orthostatic hypotension, but presently having no real symptoms of orthostasis. He generally feels well  Accompanied by family today Daughter requests FMLA form completion  Past Medical History:  Diagnosis Date  . A-fib (HCC)   . Arthritis    knees  . Asthma   . BPH (benign prostatic hypertrophy)   . Bronchospasm, exercise-induced   . CHF (congestive heart failure) (HCC)    systolic, EF 40% (07/07/2012)  . Chronic kidney disease    stabilized, due to infection  . Dyslipidemia   . Edema    Bilateral - left lower leg greater than right- wears compression stockings  . GERD (gastroesophageal reflux disease)   . Hypertension   . Macular degeneration 08-06-13   bilateral -sight impaired  . Obesity   . Shortness of breath      Social History   Social History  . Marital status: Widowed    Spouse name: N/A  . Number of children: N/A  . Years of education: N/A   Occupational History  . retired Retired   Social History Main Topics  . Smoking status: Former Smoker    Quit date: 05/17/1964  . Smokeless tobacco: Never Used  . Alcohol use No  . Drug use: No  . Sexual activity: Not Currently   Other Topics Concern  . Not on file   Social History Narrative  . No narrative on file    Past Surgical History:  Procedure Laterality Date  . CARDIOVERSION N/A 07/07/2012   Procedure: CARDIOVERSION;  Surgeon: Chrystie Nose, MD;  [facility] ENDOSCOPY;  Service: Cardiovascular;  Laterality: N/A;  . CARDIOVERSION N/A 08/30/2012   Procedure: CARDIOVERSION;  Surgeon: Chrystie Nose, MD;  Location[facility] ENDOSCOPY;  Service:  Cardiovascular;  Laterality: N/A;  . TOTAL HIP ARTHROPLASTY Left 08/13/2013   Procedure: LEFT TOTAL HIP ARTHROPLASTY ANTERIOR APPROACH;  Surgeon: [name], MD;  Location: [facility] ORS;  Service: Orthopedics;  Laterality: Left;  . TRANSTHORACIC ECHOCARDIOGRAM  07/07/2012   ef 40%; mild MR; LA mod-severely dilated; RA mildly dilated; RV systolic function mildly reduced; RV systolic pressure increase - mod pulm htn  . VASCULAR SURGERY  removed varicose veins  . VASECTOMY      Family History  Problem Relation Age of Onset  . Alzheimer's disease Mother   . Heart disease Father     Allergies  Allergen Reactions  . Nsaids     Stomach pain  . Other     Opiates cause tightness in chest  . Vicodin [Hydrocodone-Acetaminophen] Other (See Comments)    Unknown reaction many years ago  . Morphine And Related Nausea And Vomiting    Dizziness, light headed     Current Outpatient Prescriptions on File Prior to Visit  Medication Sig Dispense Refill  . acetaminophen (TYLENOL) 500 MG tablet Take 500 mg by mouth 3 (three) times daily. Takes with Tramadol    . albuterol (PROVENTIL HFA;VENTOLIN HFA) 108 (90 BASE) MCG/ACT inhaler Inhale 1-2 puffs into the lungs every 6 (six) hours as needed for wheezing or shortness of breath. 1 Inhaler 3  . bumetanide (BUMEX) 1  MG tablet Take 1 tablet in the morning and half tab in the evening 45 tablet 3  . Calcium Carbonate-Vitamin D (CALCIUM 600+D) 600-400 MG-UNIT per tablet Take 1 tablet by mouth 3 (three) times a week. Monday Wednesday and Friday    . cetirizine (ZYRTEC) 10 MG tablet Take 10 mg by mouth daily as needed for allergies.    [name] docusate sodium 100 MG CAPS Take 100 mg by mouth 2 (two) times daily. 10 capsule 0  . gabapentin (NEURONTIN) 400 MG capsule TAKE ONE CAPSULE BY MOUTH THREE TIMES DAILY 270 capsule 1  . Magnesium 250 MG TABS Take 250 mg by mouth daily.     . midodrine (PROAMATINE) 10 MG tablet Take 1 tablet (10 mg total) by mouth daily as needed. For  Systolic blood pressure (top number) under 90* 30 tablet 6  . Multiple Vitamin (MULTIVITAMIN WITH MINERALS) TABS tablet Take 1 tablet by mouth daily.    . Multiple Vitamins-Minerals (ICAPS AREDS FORMULA PO) Take 2 capsules by mouth 2 (two) times daily.    [name] omeprazole (PRILOSEC) 20 MG capsule Take 1 capsule (20 mg total) by mouth daily. 90 capsule 1  . PACERONE 200 MG tablet TAKE ONE TABLET BY MOUTH ONCE DAILY 30 tablet 6  . polyethylene glycol (MIRALAX / GLYCOLAX) packet Take 17 g by mouth 2 (two) times daily. 14 each 0  . potassium chloride SA (K-DUR,KLOR-CON) 20 MEQ tablet Take 10 mEq by mouth daily.    . rivaroxaban (XARELTO) 20 MG TABS tablet Take 1 tablet (20 mg total) by mouth daily. 21 tablet 0  . rosuvastatin (CRESTOR) 20 MG tablet Take 1 tablet (20 mg total) by mouth once a week. Friday 12 tablet 2  . senna (SENOKOT) 8.6 MG TABS tablet Take 1 tablet by mouth daily as needed for mild constipation.    [name] SPIRIVA HANDIHALER 18 MCG inhalation capsule INHALE ONE PUFF BY MOUTH ONCE DAILY 90 capsule 3  . tamsulosin (FLOMAX) 0.4 MG CAPS capsule TAKE ONE CAPSULE BY MOUTH ONCE DAILY 30 capsule 5  . traMADol (ULTRAM) 50 MG tablet Take 1-2 tablets (50-100 mg total) by mouth every 6 (six) hours as needed. 100 tablet 2  . zolpidem (AMBIEN) 5 MG tablet Take 1 tablet (5 mg total) by mouth at bedtime as needed for sleep. 30 tablet 5   No current facility-administered medications on file prior to visit.     BP 120/60 (BP Location: Right Arm, Patient Position: Sitting, Cuff Size: Normal)   Pulse 73   Temp 97.7 F (36.5 C) (Oral)   Ht 5\' 7"  (1.702 m)   Wt 212 lb 9.6 oz (96.4 kg)   SpO2 96%   BMI 33.30 kg/m      Review of Systems  Constitutional: Positive for fatigue. Negative for appetite change, chills and fever.  HENT: Negative for congestion, dental problem, ear pain, hearing loss, sore throat, tinnitus, trouble swallowing and voice change.   Eyes: Negative for pain, discharge and visual  disturbance.  Respiratory: Negative for cough, chest tightness, wheezing and stridor.   Cardiovascular: Positive for leg swelling. Negative for chest pain and palpitations.  Gastrointestinal: Negative for abdominal distention, abdominal pain, blood in stool, constipation, diarrhea, nausea and vomiting.  Genitourinary: Negative for difficulty urinating, discharge, flank pain, genital sores, hematuria and urgency.  Musculoskeletal: Positive for gait problem. Negative for arthralgias, back pain, joint swelling, myalgias and neck stiffness.  Skin: Negative for rash.  Neurological: Positive for weakness. Negative for dizziness, syncope, speech difficulty,  numbness and headaches.  Hematological: Negative for adenopathy. Does not bruise/bleed easily.  Psychiatric/Behavioral: Negative for behavioral problems and dysphoric mood. The patient is not nervous/anxious.        Objective:   Physical Exam  Constitutional: He is oriented to person, place, and time. He appears well-developed.  Blood pressure 90/60 sitting  HENT:  Head: Normocephalic.  Right Ear: External ear normal.  Left Ear: External ear normal.  Eyes: Conjunctivae and EOM are normal.  Neck: Normal range of motion.  Cardiovascular: Normal rate and normal heart sounds.   Pulmonary/Chest: He has rales.  Abdominal: Bowel sounds are normal.  Musculoskeletal: Normal range of motion. He exhibits edema. He exhibits no tenderness.  Plus 2 edema  Neurological: He is alert and oriented to person, place, and time.  Psychiatric: He has a normal mood and affect. His behavior is normal.          Assessment & Plan:   Chronic systolic heart failure, compensated History of orthostasis, stable Chronic anticoagulation.  Continue Xarelto FMLA form completion  No change in therapy Follow cardiology Continue low-salt diet and anticoagulation Check electrolytes  Follow-up 6 months or as needed  Rogelia Boga

## 2016-08-04 ENCOUNTER — Other Ambulatory Visit: Payer: Self-pay | Admitting: Internal Medicine

## 2016-08-24 ENCOUNTER — Other Ambulatory Visit: Payer: Self-pay | Admitting: Internal Medicine

## 2016-08-27 ENCOUNTER — Other Ambulatory Visit: Payer: Self-pay | Admitting: Internal Medicine

## 2016-08-27 NOTE — Telephone Encounter (Signed)
Pt daughter is calling back pharm never received the requested on 08-24-16

## 2016-09-17 ENCOUNTER — Other Ambulatory Visit: Payer: Self-pay | Admitting: Internal Medicine

## 2016-10-15 ENCOUNTER — Ambulatory Visit (INDEPENDENT_AMBULATORY_CARE_PROVIDER_SITE_OTHER): Payer: Medicare Other | Admitting: Internal Medicine

## 2016-10-15 ENCOUNTER — Encounter: Payer: Self-pay | Admitting: Internal Medicine

## 2016-10-15 VITALS — BP 124/64 | HR 67 | Ht 66.0 in | Wt 214.4 lb

## 2016-10-15 DIAGNOSIS — Z01810 Encounter for preprocedural cardiovascular examination: Secondary | ICD-10-CM | POA: Diagnosis not present

## 2016-10-15 DIAGNOSIS — I951 Orthostatic hypotension: Secondary | ICD-10-CM | POA: Diagnosis not present

## 2016-10-15 DIAGNOSIS — I428 Other cardiomyopathies: Secondary | ICD-10-CM

## 2016-10-15 DIAGNOSIS — I452 Bifascicular block: Secondary | ICD-10-CM | POA: Diagnosis not present

## 2016-10-15 DIAGNOSIS — I4891 Unspecified atrial fibrillation: Secondary | ICD-10-CM | POA: Diagnosis not present

## 2016-10-15 MED ORDER — BUMETANIDE 1 MG PO TABS: 1.0000 mg | ORAL_TABLET | Freq: Two times a day (BID) | ORAL | 5 refills | Status: AC

## 2016-10-15 NOTE — Patient Instructions (Addendum)
Your physician has requested that you have a lexiscan myoview. For further information please visit [email or web address] Please follow instruction sheet, as given.   Your physician has recommended you make the following change in your medication:  -- INCREASE Bumex to 1mg  twice daily  Samples of Xarelto 20mg  - 4 bottles - provided to patient   Your physician recommends that you schedule a follow-up appointment after your stress test (before end of June)

## 2016-10-15 NOTE — Progress Notes (Signed)
Primary Physicia [name] Savers, MD Primary Cardiologist: Dr [name]  CC: Right knee pain  HPI: 83 y/o with a history of AF. We saw him in Feb 2014 and attempted cardioversion then but he failed to convert. He was put on Amiodarone and cardioverted successfully in April 2014. He has been on Amiodarone and Xarelto. He saw [name] recently with complaints of near syncopal episodes. He describes a "flushed feeling" with weakness. His HR at home is in the 50s. He has noted more of these episodes when he is trying to urinate. [name] had stopped his digoxin and lower his dose of amiodarone to 200 mg daily. Since that time his symptoms have improved significantly, however he has had some episodes of low blood pressure. At that time he was diagnosed with atrial fibrillation his EF was 40%, suspect it may be improved as it may been tachycardia related. He continues on Bumex 1 mg daily for most days of the week and 2 or 3 days a week takes it twice daily. This may however be too much diuretic for him.  He also wore a cardia monitor between 12/06/2012 at 12/19/2012. During the 315 hours of monitoring, there was only sinus rhythm and a short episode of sinus bradycardia at 59 with a first degree AV block and intraventricular conduction delay. No pauses or atrial fibrillation were noted.  [name] returns today and is feeling quite well. He continues to have labile hypotension and is not on any blood pressure medications. I would not add any medications due to the fact that in general his blood pressure since around 100 systolic. He does have higher blood pressures in the morning.  He recently underwent total hip arthroplasty by Dr. Charlann [name] and has rehabilitated over the past several months. He reports an improvement in his ambulation. He is weight is stable in volume status appears stable. He did have edema after surgery which is improved. He continues to have an underlying right bundle branch block but is  maintaining sinus rhythm on amiodarone. He is intraventricular conduction delay is worse today however at 184 ms for the QRS. This suggests a significant intraventricular conduction delay.  The only offending medication could be amiodarone.   [name] returns today for follow-up. He has been having some dizziness with change in position and presyncope. Recently Dr. Kirtland [name] had decreased his Bumex to 0.5 mg twice a day. His daughter seems to think that this is helped with his symptoms. She is interested in decreasing the Bumex to 1/2 mg once daily.   09/19/2015  [name] returns today for follow-up. He has had some increase in swelling and we increased his Bumex dose back to 0.5 mg twice daily. He has had some low blood pressures, particularly low diastolic blood pressures. This may contribute to his swimmy headedness. Blood pressure in the office today was 112/57.  04/06/2016  [name] was seen back in the office today for follow-up. He reports that in general he's doing fairly well and needs to take his midodrine once or twice a week. He is on a 0.5 mg twice daily. He has had about 4-5 pound weight gain since I last saw him. He reports some more swelling in his thighs and abdomen. He denies any worsening shortness of breath. Blood pressure is a little more stable than it had been. He also is having some autonomic episodes including periodic hot flashes.  05/28/2016  [name] returns today for follow-up. He seems to be  doing fairly well on his current dose of diuretics. Weight is down somewhat and swelling is very well controlled. Recent BNP was less than 100. He has not had any significant orthostasis. He continues to take midodrine daily.  10/15/2016  [name] returns today for follow-up. He is in a wheelchair reports has been having trouble with right knee pain. He's had prior hip replacement which worked very well for him. He's continued to struggle with right knee pain and is interested in knee  replacement surgery. Does have a history of cardiomyopathy in the past and has had problems with hypotension on midodrine. Recently he's had 3-4 pounds weight gain since I saw him in the office in January. He's complained of some worsening lower extremity swelling. He is currently taking bumetanide 1 mg every morning and 0.5 mg every afternoon. He denies any chest pain or worsening shortness of breath.  Current Outpatient Prescriptions  Medication Sig Dispense Refill  . bumetanide (BUMEX) 1 MG tablet Take 1 tablet (1 mg total) by mouth 2 (two) times daily. 60 tablet 5  . acetaminophen (TYLENOL) 500 MG tablet Take 500 mg by mouth 3 (three) times daily. Takes with Tramadol    . albuterol (PROVENTIL HFA;VENTOLIN HFA) 108 (90 BASE) MCG/ACT inhaler Inhale 1-2 puffs into the lungs every 6 (six) hours as needed for wheezing or shortness of breath. 1 Inhaler 3  . Calcium Carbonate-Vitamin D (CALCIUM 600+D) 600-400 MG-UNIT per tablet Take 1 tablet by mouth 3 (three) times a week. Monday Wednesday and Friday    . cetirizine (ZYRTEC) 10 MG tablet Take 10 mg by mouth daily as needed for allergies.    [name] docusate sodium 100 MG CAPS Take 100 mg by mouth 2 (two) times daily. 10 capsule 0  . gabapentin (NEURONTIN) 400 MG capsule TAKE ONE CAPSULE BY MOUTH THREE TIMES DAILY 270 capsule 1  . Magnesium 250 MG TABS Take 250 mg by mouth daily.     . midodrine (PROAMATINE) 10 MG tablet TAKE ONE TABLET BY MOUTH ONCE DAILY AS NEEDED FOR  SYSTOLIC  BLOOD  PRESSURE  (TOP  NUMBER)  UNDER  90 90 tablet 2  . Multiple Vitamin (MULTIVITAMIN WITH MINERALS) TABS tablet Take 1 tablet by mouth daily.    . Multiple Vitamins-Minerals (ICAPS AREDS FORMULA PO) Take 2 capsules by mouth 2 (two) times daily.    [name] omeprazole (PRILOSEC) 20 MG capsule Take 1 capsule (20 mg total) by mouth daily. 90 capsule 1  . PACERONE 200 MG tablet TAKE ONE TABLET BY MOUTH ONCE DAILY 30 tablet 6  . polyethylene glycol (MIRALAX / GLYCOLAX) packet Take 17 g by  mouth 2 (two) times daily. 14 each 0  . potassium chloride SA (K-DUR,KLOR-CON) 20 MEQ tablet Take 10 mEq by mouth daily.    . rivaroxaban (XARELTO) 20 MG TABS tablet Take 1 tablet (20 mg total) by mouth daily. 21 tablet 0  . rosuvastatin (CRESTOR) 20 MG tablet Take 1 tablet (20 mg total) by mouth once a week. Friday 12 tablet 2  . senna (SENOKOT) 8.6 MG TABS tablet Take 1 tablet by mouth daily as needed for mild constipation.    [name] SPIRIVA HANDIHALER 18 MCG inhalation capsule INHALE ONE PUFF BY MOUTH ONCE DAILY 90 capsule 3  . tamsulosin (FLOMAX) 0.4 MG CAPS capsule TAKE ONE CAPSULE BY MOUTH ONCE DAILY 30 capsule 5  . traMADol (ULTRAM) 50 MG tablet TAKE ONE TO TWO TABLETS BY MOUTH EVERY 6 HOURS AS NEEDED 100 tablet 2  .  zolpidem (AMBIEN) 5 MG tablet Take 1 tablet (5 mg total) by mouth at bedtime as needed for sleep. 30 tablet 5   No current facility-administered medications for this visit.     Allergies  Allergen Reactions  . Nsaids     Stomach pain  . Other     Opiates cause tightness in chest  . Vicodin [Hydrocodone-Acetaminophen] Other (See Comments)    Unknown reaction many years ago  . Morphine And Related Nausea And Vomiting    Dizziness, light headed     Social History   Social History  . Marital status: Widowed    Spouse name: N/A  . Number of children: N/A  . Years of education: N/A   Occupational History  . retired Retired   Social History Main Topics  . Smoking status: Former Smoker    Quit date: 05/17/1964  . Smokeless tobacco: Never Used  . Alcohol use No  . Drug use: No  . Sexual activity: Not Currently   Other Topics Concern  . Not on file   Social History Narrative  . No narrative on file     Review of Systems: Pertinent items noted in HPI and remainder of comprehensive ROS otherwise negative.  Physical Exam: Blood pressure 124/64, pulse 67, height 5\' 6"  (1.676 m), weight 214 lb 6.4 oz (97.3 kg).  General appearance: alert, no distress and  moderately obese Neck: no carotid bruit and no JVD Lungs: diminished breath sounds bilaterally and rales LLL Heart: regular rate and rhythm Abdomen: soft, non-tender; bowel sounds normal; no masses,  no organomegaly and obese Extremities: edema 2+ bilateral LE edema Pulses: 2+ and symmetric Skin: pale, warm, dry Neurologic: Grossly normal Psych: Pleasant  EKG  Normal sinus rhythm at 67, bifascicular block  ASSESSMENT AND PLAN:   Patient Active Problem List   Diagnosis Date Noted  . Chronic systolic congestive heart failure (HCC) 05/28/2016  . Orthostatic hypotension 09/20/2015  . Impaired glucose tolerance 11/25/2014  . Bifascicular block 12/18/2013  . Bilateral lower extremity edema 10/25/2013  . CHF (congestive heart failure) (HCC) 08/30/2013  . S/P left THA, AA 08/13/2013  . Chronic anticoagulation 12/06/2012  . Near syncope 12/06/2012  . Acute systolic CHF (congestive heart failure), NYHA class 3 -- Unclear etilogy (? Afib related) EF down from 60-70% to 40%. 07/05/2012  . General weakness 07/05/2012  . Paroxysmal Atrial fibrillation - admitted with RVR 01/18/2012  . DJD (degenerative joint disease), lumbar 01/18/2012  . Hx of colonic polyps 11/02/2011  . BENIGN PROSTATIC HYPERTROPHY 05/13/2010  . SEBORRHEA CAPITIS 02/13/2010  . History of elevated glucose 10/01/2009  . CHRONIC RHINITIS 06/02/2009  . Dyslipidemia 11/01/2008  . INSOMNIA, CHRONIC, MILD 01/02/2008  . Essential hypertension 02/06/2007  . ASTHMA 02/06/2007    PLAN   Mr. Garramone Has been struggling with right knee pain is interested in possible knee replacement. He has an appointment later on this month with Dr. Charlann [name] to evaluate right total knee replacement. Given his history of congestive heart failure in the past, bifascicular block on EKG and labile hypotension, I would recommend a nuclear stress test for risk stratification. He is not currently active due to his knee problem and its difficult to assess his  exercise capacity. In addition he does appear mildly volume overloaded with some increased weight. I advised increasing Bumex to 1 mg twice a day. Family will monitor weight as his dry weight should be somewhere around 210 pounds. If he has any symptoms of orthostasis that we may  need to increase it back to 1 mg the morning and 0.5 mg in the afternoon.  Follow-up after a stress test.  Chrystie Nose, MD, [facility] Attending Cardiologist [facility]  [facility] 10/15/2016 10:02 AM

## 2016-10-26 ENCOUNTER — Encounter: Payer: Self-pay | Admitting: Internal Medicine

## 2016-10-27 ENCOUNTER — Telehealth (HOSPITAL_COMMUNITY): Payer: Self-pay

## 2016-10-27 NOTE — Telephone Encounter (Signed)
Encounter complete. 

## 2016-10-28 ENCOUNTER — Other Ambulatory Visit: Payer: Self-pay | Admitting: Internal Medicine

## 2016-10-29 ENCOUNTER — Ambulatory Visit (HOSPITAL_COMMUNITY)
Admission: RE | Admit: 2016-10-29 | Discharge: 2016-10-29 | Disposition: A | Discharge status: Home / Self Care | Payer: Medicare Other | Source: Ambulatory Visit | Attending: Cardiovascular Disease | Admitting: Cardiovascular Disease

## 2016-10-29 DIAGNOSIS — I428 Other cardiomyopathies: Secondary | ICD-10-CM | POA: Insufficient documentation

## 2016-10-29 DIAGNOSIS — Z01810 Encounter for preprocedural cardiovascular examination: Secondary | ICD-10-CM | POA: Diagnosis not present

## 2016-10-29 DIAGNOSIS — I4891 Unspecified atrial fibrillation: Secondary | ICD-10-CM | POA: Diagnosis not present

## 2016-10-29 LAB — MYOCARDIAL PERFUSION IMAGING
CHL CUP NUCLEAR SSS: 0
CHL CUP STRESS STAGE 2 SPEED: 0 mph
CHL CUP STRESS STAGE 3 HR: 68 {beats}/min
CHL CUP STRESS STAGE 3 SPEED: 0 mph
CHL CUP STRESS STAGE 4 DBP: 89 mmHg
CHL CUP STRESS STAGE 4 SBP: 104 mmHg
CSEPEW: 1 METS
CSEPPMHR: 51 %
LV sys vol: 49 mL
LVDIAVOL: 137 mL (ref 62–150)
Peak HR: 68 {beats}/min
Rest HR: 60 {beats}/min
SDS: 0
SRS: 0
Stage 1 DBP: 69 mmHg
Stage 1 Grade: 0 %
Stage 1 HR: 63 {beats}/min
Stage 1 SBP: 150 mmHg
Stage 1 Speed: 0 mph
Stage 2 Grade: 0 %
Stage 2 HR: 64 {beats}/min
Stage 3 Grade: 0 %
Stage 4 Grade: 0 %
Stage 4 HR: 80 {beats}/min
Stage 4 Speed: 0 mph
TID: 1

## 2016-10-29 MED ORDER — TECHNETIUM TC 99M TETROFOSMIN IV KIT
30.5000 | PACK | Freq: Once | INTRAVENOUS | Status: AC | PRN
  Administered 2016-10-29: 30.5 via INTRAVENOUS
  Filled 2016-10-29: qty 31

## 2016-10-29 MED ORDER — REGADENOSON 0.4 MG/5ML IV SOLN
0.4000 mg | Freq: Once | INTRAVENOUS | Status: AC
  Administered 2016-10-29: 0.4 mg via INTRAVENOUS

## 2016-10-29 MED ORDER — TECHNETIUM TC 99M TETROFOSMIN IV KIT
10.2000 | PACK | Freq: Once | INTRAVENOUS | Status: AC | PRN
  Administered 2016-10-29: 10.2 via INTRAVENOUS
  Filled 2016-10-29: qty 11

## 2016-10-29 NOTE — Telephone Encounter (Signed)
REFILL 

## 2016-11-05 ENCOUNTER — Encounter: Payer: Self-pay | Admitting: Internal Medicine

## 2016-11-05 ENCOUNTER — Ambulatory Visit (INDEPENDENT_AMBULATORY_CARE_PROVIDER_SITE_OTHER): Payer: Medicare Other | Admitting: Internal Medicine

## 2016-11-05 VITALS — BP 96/54 | HR 74 | Ht 66.0 in | Wt 205.0 lb

## 2016-11-05 DIAGNOSIS — R600 Localized edema: Secondary | ICD-10-CM

## 2016-11-05 DIAGNOSIS — I10 Essential (primary) hypertension: Secondary | ICD-10-CM | POA: Diagnosis not present

## 2016-11-05 DIAGNOSIS — Z01810 Encounter for preprocedural cardiovascular examination: Secondary | ICD-10-CM

## 2016-11-05 DIAGNOSIS — I4891 Unspecified atrial fibrillation: Secondary | ICD-10-CM | POA: Diagnosis not present

## 2016-11-05 DIAGNOSIS — I428 Other cardiomyopathies: Secondary | ICD-10-CM | POA: Diagnosis not present

## 2016-11-05 DIAGNOSIS — I5022 Chronic systolic (congestive) heart failure: Secondary | ICD-10-CM | POA: Diagnosis not present

## 2016-11-05 DIAGNOSIS — H6122 Impacted cerumen, left ear: Secondary | ICD-10-CM | POA: Insufficient documentation

## 2016-11-05 NOTE — Progress Notes (Signed)
Primary Physicia [name], MD Primary Cardiologist: Dr Rennis Golden  CC: Right knee pain, follow-up stress test, left ear fullness  HPI: 83 y/o with a history of AF. We saw him in Feb 2014 and attempted cardioversion then but he failed to convert. He was put on Amiodarone and cardioverted successfully in April 2014. He has been on Amiodarone and Xarelto. He saw [name] recently with complaints of near syncopal episodes. He describes a "flushed feeling" with weakness. His HR at home is in the 50s. He has noted more of these episodes when he is trying to urinate. [name] had stopped his digoxin and lower his dose of amiodarone to 200 mg daily. Since that time his symptoms have improved significantly, however he has had some episodes of low blood pressure. At that time he was diagnosed with atrial fibrillation his EF was 40%, suspect it may be improved as it may been tachycardia related. He continues on Bumex 1 mg daily for most days of the week and 2 or 3 days a week takes it twice daily. This may however be too much diuretic for him.  He also wore a cardia monitor between 12/06/2012 at 12/19/2012. During the 315 hours of monitoring, there was only sinus rhythm and a short episode of sinus bradycardia at 59 with a first degree AV block and intraventricular conduction delay. No pauses or atrial fibrillation were noted.  Mr. Roupp returns today and is feeling quite well. He continues to have labile hypotension and is not on any blood pressure medications. I would not add any medications due to the fact that in general his blood pressure since around 100 systolic. He does have higher blood pressures in the morning.  He recently underwent total hip arthroplasty by Dr. Charlann Boxer and has rehabilitated over the past several months. He reports an improvement in his ambulation. He is weight is stable in volume status appears stable. He did have edema after surgery which is improved. He continues to have an  underlying right bundle branch block but is maintaining sinus rhythm on amiodarone. He is intraventricular conduction delay is worse today however at 184 ms for the QRS. This suggests a significant intraventricular conduction delay.  The only offending medication could be amiodarone.   Mr. Walz returns today for follow-up. He has been having some dizziness with change in position and presyncope. Recently Dr. Kirtland Bouchard had decreased his Bumex to 0.5 mg twice a day. His daughter seems to think that this is helped with his symptoms. She is interested in decreasing the Bumex to 1/2 mg once daily.   09/19/2015  Mr. Banik returns today for follow-up. He has had some increase in swelling and we increased his Bumex dose back to 0.5 mg twice daily. He has had some low blood pressures, particularly low diastolic blood pressures. This may contribute to his swimmy headedness. Blood pressure in the office today was 112/57.  04/06/2016  Mr. Kishbaugh was seen back in the office today for follow-up. He reports that in general he's doing fairly well and needs to take his midodrine once or twice a week. He is on a 0.5 mg twice daily. He has had about 4-5 pound weight gain since I last saw him. He reports some more swelling in his thighs and abdomen. He denies any worsening shortness of breath. Blood pressure is a little more stable than it had been. He also is having some autonomic episodes including periodic hot flashes.  05/28/2016  Mr. Dehoyos returns today  for follow-up. He seems to be doing fairly well on his current dose of diuretics. Weight is down somewhat and swelling is very well controlled. Recent BNP was less than 100. He has not had any significant orthostasis. He continues to take midodrine daily.  10/15/2016  Mr. Pakula returns today for follow-up. He is in a wheelchair reports has been having trouble with right knee pain. He's had prior hip replacement which worked very well for him. He's continued to struggle with right  knee pain and is interested in knee replacement surgery. Does have a history of cardiomyopathy in the past and has had problems with hypotension on midodrine. Recently he's had 3-4 pounds weight gain since I saw him in the office in January. He's complained of some worsening lower extremity swelling. He is currently taking bumetanide 1 mg every morning and 0.5 mg every afternoon. He denies any chest pain or worsening shortness of breath.  11/05/2016  Mr. Anglin returns today for follow-up of his stress test. This was negative for ischemia and showed an EF of 64%. I would say he is at acceptable risk for the surgery should he choose to pursue that. He did have some weight gain since I last saw him and I advised him to increase his bumetanide to 1 mg twice a day. Since then he's lost about 5 pounds and reports improvement in swelling. He denies any worsening shortness of breath or chest pain. He tells me his only other concern today is that he has had some fullness in his left ear decreased hearing on that side. He is concerned about a possible ear infection.  Current Outpatient Prescriptions  Medication Sig Dispense Refill  . acetaminophen (TYLENOL) 500 MG tablet Take 500 mg by mouth 3 (three) times daily. Takes with Tramadol    . albuterol (PROVENTIL HFA;VENTOLIN HFA) 108 (90 BASE) MCG/ACT inhaler Inhale 1-2 puffs into the lungs every 6 (six) hours as needed for wheezing or shortness of breath. 1 Inhaler 3  . amiodarone (PACERONE) 200 MG tablet TAKE ONE TABLET BY MOUTH ONCE DAILY 90 tablet 3  . bumetanide (BUMEX) 1 MG tablet Take 1 tablet (1 mg total) by mouth 2 (two) times daily. 60 tablet 5  . Calcium Carbonate-Vitamin D (CALCIUM 600+D) 600-400 MG-UNIT per tablet Take 1 tablet by mouth 3 (three) times a week. Monday Wednesday and Friday    . cetirizine (ZYRTEC) 10 MG tablet Take 10 mg by mouth daily as needed for allergies.    [name] docusate sodium 100 MG CAPS Take 100 mg by mouth 2 (two) times daily. 10  capsule 0  . gabapentin (NEURONTIN) 400 MG capsule TAKE ONE CAPSULE BY MOUTH THREE TIMES DAILY 270 capsule 1  . Magnesium 250 MG TABS Take 250 mg by mouth daily.     . midodrine (PROAMATINE) 10 MG tablet TAKE ONE TABLET BY MOUTH ONCE DAILY AS NEEDED FOR  SYSTOLIC  BLOOD  PRESSURE  (TOP  NUMBER)  UNDER  90 90 tablet 2  . Multiple Vitamin (MULTIVITAMIN WITH MINERALS) TABS tablet Take 1 tablet by mouth daily.    . Multiple Vitamins-Minerals (ICAPS AREDS FORMULA PO) Take 2 capsules by mouth 2 (two) times daily.    [name] omeprazole (PRILOSEC) 20 MG capsule Take 1 capsule (20 mg total) by mouth daily. 90 capsule 1  . polyethylene glycol (MIRALAX / GLYCOLAX) packet Take 17 g by mouth 2 (two) times daily. 14 each 0  . potassium chloride SA (K-DUR,KLOR-CON) 20 MEQ tablet Take 10 mEq  by mouth daily.    . rivaroxaban (XARELTO) 20 MG TABS tablet Take 1 tablet (20 mg total) by mouth daily. 21 tablet 0  . rosuvastatin (CRESTOR) 20 MG tablet Take 1 tablet (20 mg total) by mouth once a week. Friday 12 tablet 2  . senna (SENOKOT) 8.6 MG TABS tablet Take 1 tablet by mouth daily as needed for mild constipation.    [name] SPIRIVA HANDIHALER 18 MCG inhalation capsule INHALE ONE PUFF BY MOUTH ONCE DAILY 90 capsule 3  . tamsulosin (FLOMAX) 0.4 MG CAPS capsule TAKE ONE CAPSULE BY MOUTH ONCE DAILY 30 capsule 5  . traMADol (ULTRAM) 50 MG tablet TAKE ONE TO TWO TABLETS BY MOUTH EVERY 6 HOURS AS NEEDED 100 tablet 2  . zolpidem (AMBIEN) 5 MG tablet Take 1 tablet (5 mg total) by mouth at bedtime as needed for sleep. 30 tablet 5   No current facility-administered medications for this visit.     Allergies  Allergen Reactions  . Nsaids     Stomach pain  . Other     Opiates cause tightness in chest  . Vicodin [Hydrocodone-Acetaminophen] Other (See Comments)    Unknown reaction many years ago  . Morphine And Related Nausea And Vomiting    Dizziness, light headed     Social History   Social History  . Marital status: Widowed      Spouse name: N/A  . Number of children: N/A  . Years of education: N/A   Occupational History  . retired Retired   Social History Main Topics  . Smoking status: Former Smoker    Quit date: 05/17/1964  . Smokeless tobacco: Never Used  . Alcohol use No  . Drug use: No  . Sexual activity: Not Currently   Other Topics Concern  . Not on file   Social History Narrative  . No narrative on file     Review of Systems: Pertinent items noted in HPI and remainder of comprehensive ROS otherwise negative.  Physical Exam: Blood pressure (!) 96/54, pulse 74, height 5\' 6"  (1.676 m), weight 205 lb (93 kg).  General appearance: alert, no distress and moderately obese Neck: no carotid bruit, no JVD and The left ear was examined which indicated a moderate amount of cerumen in the canal and overlying the left tympanic membrane. Lungs: clear to auscultation bilaterally Heart: regular rate and rhythm Abdomen: soft, non-tender; bowel sounds normal; no masses,  no organomegaly and obese Extremities: edema Trace bilateral lower extremity edema Pulses: 2+ and symmetric Skin: Skin color, texture, turgor normal. No rashes or lesions Neurologic: Grossly normal Psych: Pleasant  EKG  Deferred  ASSESSMENT AND PLAN:   Patient Active Problem List   Diagnosis Date Noted  . Pre-operative cardiovascular examination 10/15/2016  . Non-ischemic cardiomyopathy (HCC) 10/15/2016  . Chronic systolic congestive heart failure (HCC) 05/28/2016  . Orthostatic hypotension 09/20/2015  . Impaired glucose tolerance 11/25/2014  . Bifascicular block 12/18/2013  . Bilateral lower extremity edema 10/25/2013  . CHF (congestive heart failure) (HCC) 08/30/2013  . S/P left THA, AA 08/13/2013  . Chronic anticoagulation 12/06/2012  . Near syncope 12/06/2012  . Acute systolic CHF (congestive heart failure), NYHA class 3 -- Unclear etilogy (? Afib related) EF down from 60-70% to 40%. 07/05/2012  . General weakness  07/05/2012  . Paroxysmal Atrial fibrillation - admitted with RVR 01/18/2012  . DJD (degenerative joint disease), lumbar 01/18/2012  . Hx of colonic polyps 11/02/2011  . BENIGN PROSTATIC HYPERTROPHY 05/13/2010  . SEBORRHEA CAPITIS 02/13/2010  .  History of elevated glucose 10/01/2009  . CHRONIC RHINITIS 06/02/2009  . Dyslipidemia 11/01/2008  . INSOMNIA, CHRONIC, MILD 01/02/2008  . Essential hypertension 02/06/2007  . ASTHMA 02/06/2007    PLAN   Mr. Viegas Has had improvement in his fine status with increasing dose of bumetanide. His stress test was low risk and therefore he is acceptable risk for upcoming surgery. They're going to discuss this with Dr. Charlann Boxer. He is currently on Xarelto would have to hold his dose Xarelto for 2 days prior to surgery to lower operative bleeding risk. On exam he has significant cerumen in the left ear and would benefit from Debrox drops as directed.  Follow-up in 6 months.  Chrystie Nose, MD, [facility] Attending Cardiologist [facility]  [facility] 11/05/2016 11:44 AM

## 2016-11-05 NOTE — Patient Instructions (Signed)
DEBROX drops over the counter - use as directed  Your physician wants you to follow-up in: 6 months with Dr. Rennis Golden. You will receive a reminder letter in the mail two months in advance. If you don't receive a letter, please call our office to schedule the follow-up appointment.

## 2016-11-12 ENCOUNTER — Other Ambulatory Visit: Payer: Self-pay | Admitting: Internal Medicine

## 2016-12-06 NOTE — Progress Notes (Signed)
Subjective:   [name] is a 83 y.o. male who presents for Medicare Annual/Subsequent preventive examination.  Patient accompanied by son and daughter.   Review of Systems:  No ROS.  Medicare Wellness Visit. Additional risk factors are reflected in the social history.  Cardiac Risk Factors include: advanced age (>58mn, >>7women);dyslipidemia;family history of premature cardiovascular disease;male gender;sedentary lifestyle;hypertension   Sleep patterns: Sleeps at least 8 hours, feels rested. Up to void x 3-4.  Home Safety/Smoke Alarms: Feels safe in home. Smoke alarms in place.  Living environment; residence and Firearm Safety: Lives with son in 1 story home.  Seat Belt Safety/Bike Helmet: Wears seat belt.   Counseling:   Eye Exam-Last exam 2016, Dr. BGrayce Sessions Dr. RZadie Rhinefor Mac Deg  Dental-Full dentures, gum care discussed.   Male:   CCS-Colonoscopy > 10 years, normal.     PSA-  Lab Results  Component Value Date   PSA 0.74 10/18/2006       Objective:    Vitals: BP (!) 100/50 (BP Location: Right Arm, Patient Position: Sitting, Cuff Size: Normal)   Pulse 67   Resp 18   Ht _0  (1.676 m)   Wt 208 lb 11.2 oz (94.7 kg)   SpO2 95%   BMI 33.69 kg/m   Body mass index is 33.69 kg/m.  Tobacco History  Smoking Status  . Former Smoker  . Quit date: 05/17/1964  Smokeless Tobacco  . Never Used     Counseling given: No   Past Medical History:  Diagnosis Date  . A-fib ([place]   . Arthritis    knees  . Asthma   . BPH (benign prostatic hypertrophy)   . Bronchospasm, exercise-induced   . CHF (congestive heart failure) (HCC)    systolic, EF 478%(29/38/1017  . Chronic kidney disease    stabilized, due to infection  . Dyslipidemia   . Edema    Bilateral - left lower leg greater than right- wears compression stockings  . GERD (gastroesophageal reflux disease)   . Hypertension   . Macular degeneration 08-06-13   bilateral -sight impaired  . Obesity   .  Shortness of breath    Past Surgical History:  Procedure Laterality Date  . CARDIOVERSION N/A 07/07/2012   Procedure: CARDIOVERSION;  Surgeon: KPixie Casino MD;  [facility]  Service: Cardiovascular;  Laterality: N/A;  . CARDIOVERSION N/A 08/30/2012   Procedure: CARDIOVERSION;  Surgeon: KPixie Casino MD;  [place]  Service: Cardiovascular;  Laterality: N/A;  . TOTAL HIP ARTHROPLASTY Left 08/13/2013   Procedure: LEFT TOTAL HIP ARTHROPLASTY ANTERIOR APPROACH;  Surgeon: MMauri Pole MD;  Location: [facility];  Service: Orthopedics;  Laterality: Left;  . TRANSTHORACIC ECHOCARDIOGRAM  07/07/2012   ef 40%; mild MR; LA mod-severely dilated; RA mildly dilated; RV systolic function mildly reduced; RV systolic pressure increase - mod pulm htn  . VASCULAR SURGERY  removed varicose veins  . VASECTOMY     Family History  Problem Relation Age of Onset  . Alzheimer's disease Mother   . Heart disease Father    History  Sexual Activity  . Sexual activity: Not Currently    Outpatient Encounter Prescriptions as of 12/07/2016  Medication Sig  . acetaminophen (TYLENOL) 500 MG tablet Take 500 mg by mouth 3 (three) times daily. Takes with Tramadol  . albuterol (PROVENTIL HFA;VENTOLIN HFA) 108 (90 BASE) MCG/ACT inhaler Inhale 1-2 puffs into the lungs every 6 (six) hours as needed for wheezing or shortness of  breath.  [name] amiodarone (PACERONE) 200 MG tablet TAKE ONE TABLET BY MOUTH ONCE DAILY  . bumetanide (BUMEX) 1 MG tablet Take 1 tablet (1 mg total) by mouth 2 (two) times daily.  . Calcium Carbonate-Vitamin D (CALCIUM 600+D) 600-400 MG-UNIT per tablet Take 1 tablet by mouth 3 (three) times a week. Monday Wednesday and Friday  . cetirizine (ZYRTEC) 10 MG tablet Take 10 mg by mouth daily as needed for allergies.  [name] docusate sodium 100 MG CAPS Take 100 mg by mouth 2 (two) times daily.  [name] gabapentin (NEURONTIN) 400 MG capsule TAKE ONE CAPSULE BY MOUTH THREE TIMES DAILY  . KLOR-CON  M20 20 MEQ tablet TAKE ONE TABLET BY MOUTH ONCE DAILY  . Magnesium 250 MG TABS Take 250 mg by mouth daily.   . midodrine (PROAMATINE) 10 MG tablet TAKE ONE TABLET BY MOUTH ONCE DAILY AS NEEDED FOR  SYSTOLIC  BLOOD  PRESSURE  (TOP  NUMBER)  UNDER  90  . Multiple Vitamin (MULTIVITAMIN WITH MINERALS) TABS tablet Take 1 tablet by mouth daily.  . Multiple Vitamins-Minerals (ICAPS AREDS FORMULA PO) Take 2 capsules by mouth 2 (two) times daily.  [name] omeprazole (PRILOSEC) 20 MG capsule Take 1 capsule (20 mg total) by mouth daily.  . polyethylene glycol (MIRALAX / GLYCOLAX) packet Take 17 g by mouth 2 (two) times daily.  . rivaroxaban (XARELTO) 20 MG TABS tablet Take 1 tablet (20 mg total) by mouth daily.  . rosuvastatin (CRESTOR) 20 MG tablet Take 1 tablet (20 mg total) by mouth once a week. Friday  . senna (SENOKOT) 8.6 MG TABS tablet Take 1 tablet by mouth daily as needed for mild constipation.  [name] SPIRIVA HANDIHALER 18 MCG inhalation capsule INHALE ONE PUFF BY MOUTH ONCE DAILY  . tamsulosin (FLOMAX) 0.4 MG CAPS capsule TAKE ONE CAPSULE BY MOUTH ONCE DAILY  . traMADol (ULTRAM) 50 MG tablet TAKE 1 TO 2 TABLETS BY MOUTH EVERY 6 HOURS AS NEEDED  . zolpidem (AMBIEN) 5 MG tablet Take 1 tablet (5 mg total) by mouth at bedtime as needed for sleep.  . [DISCONTINUED] potassium chloride SA (K-DUR,KLOR-CON) 20 MEQ tablet Take 10 mEq by mouth daily.   No facility-administered encounter medications on file as of 12/07/2016.     Activities of Daily Living In your present state of health, do you have any difficulty performing the following activities: 12/07/2016  Hearing? Y  Vision? N  Difficulty concentrating or making decisions? N  Walking or climbing stairs? Y  Dressing or bathing? Y  Doing errands, shopping? Y  Preparing Food and eating ? Y  Using the Toilet? N  In the past six months, have you accidently leaked urine? N  Do you have problems with loss of bowel control? N  Managing your Medications? Y    Managing your Finances? Y  Housekeeping or managing your Housekeeping? Y  Some recent data might be hidden    Patient Care Team: Marletta Lor, MD as PCP - General (Internal Medicine)   Assessment:    Physical assessment deferred to PCP.  Exercise Activities and Dietary recommendations Current Exercise Habits: The patient does not participate in regular exercise at present (Performs chair exercises (legs and arms), walks in place), Exercise limited by: orthopedic condition(s)   Diet (meal preparation, eat out, water intake, caffeinated beverages, dairy products, fruits and vegetables): Drinks water and diet gingerale  Breakfast: oatmeal, fruit, cereal Lunch: fruit Dinner: protein, vegetables, bread  Goals    . Increase physical activity  Fall Risk Fall Risk  12/07/2016 07/23/2016 06/02/2015 11/25/2014 11/20/2013  Falls in the past year? Yes No Yes No Yes  Number falls in past yr: 2 or more - 1 - 2 or more  Injury with Fall? No - No - -  Risk Factor Category  - - - - High Fall Risk  Risk for fall due to : - - Other (Comment) - Impaired balance/gait  Risk for fall due to (comments): - - Tripped over cat - -  Follow up Falls prevention discussed - - - -   Depression Screen PHQ 2/9 Scores 12/07/2016 07/23/2016 06/02/2015 11/25/2014  PHQ - 2 Score 0 1 0 0    Cognitive Function       Ad8 score reviewed for issues:  Issues making decisions: no  Less interest in hobbies / activities: no  Repeats questions, stories (family complaining): no  Trouble using ordinary gadgets (microwave, computer, phone): no  Forgets the month or year: no  Mismanaging finances: no  Remembering appts: no  Daily problems with thinking and/or memory: no Ad8 score is=0     Immunization History  Administered Date(s) Administered  . Influenza Split 02/19/2011  . Influenza Whole 05/17/2000, 02/17/2007, 02/15/2008, 02/19/2009, 02/13/2010  . Influenza, High Dose Seasonal PF 03/08/2015,  02/07/2016  . Influenza,inj,Quad PF,36+ Mos 01/22/2013, 02/15/2014  . Pneumococcal Conjugate-13 11/20/2013  . Pneumococcal Polysaccharide-23 06/17/1997  . Td 06/17/1997  . Tetanus 11/20/2013   Will discuss Shingrix vaccine with PCP at next OV.   Screening Tests Health Maintenance  Topic Date Due  . INFLUENZA VACCINE  12/15/2016  . TETANUS/TDAP  11/21/2023  . PNA vac Low Risk Adult  Completed      Plan:    Bring a copy of your advance directives to your next office visit.  Discuss Shingrix vaccine with PCP at next office visit.   I have personally reviewed and noted the following in the patient's chart:   . Medical and social history . Use of alcohol, tobacco or illicit drugs  . Current medications and supplements . Functional ability and status . Nutritional status . Physical activity . Advanced directives . List of other physicians . Hospitalizations, surgeries, and ER visits in previous 12 months . Vitals . Screenings to include cognitive, depression, and falls . Referrals and appointments  In addition, I have reviewed and discussed with patient certain preventive protocols, quality metrics, and best practice recommendations. A written personalized care plan for preventive services as well as general preventive health recommendations were provided to patient.     Gerilyn Nestle, RN  12/07/2016  Results of Medicare wellness visit, reviewed and agree with findings.  Nyoka Cowden

## 2016-12-07 ENCOUNTER — Telehealth: Payer: Self-pay | Admitting: Internal Medicine

## 2016-12-07 ENCOUNTER — Ambulatory Visit (INDEPENDENT_AMBULATORY_CARE_PROVIDER_SITE_OTHER): Payer: Medicare Other | Admitting: Adult Health

## 2016-12-07 ENCOUNTER — Ambulatory Visit (INDEPENDENT_AMBULATORY_CARE_PROVIDER_SITE_OTHER): Payer: Medicare Other

## 2016-12-07 VITALS — Temp 98.0°F

## 2016-12-07 VITALS — BP 100/50 | HR 67 | Resp 18 | Ht 66.0 in | Wt 208.7 lb

## 2016-12-07 DIAGNOSIS — H6123 Impacted cerumen, bilateral: Secondary | ICD-10-CM

## 2016-12-07 DIAGNOSIS — Z0000 Encounter for general adult medical examination without abnormal findings: Secondary | ICD-10-CM

## 2016-12-07 DIAGNOSIS — I509 Heart failure, unspecified: Secondary | ICD-10-CM

## 2016-12-07 DIAGNOSIS — I4891 Unspecified atrial fibrillation: Secondary | ICD-10-CM

## 2016-12-07 DIAGNOSIS — I10 Essential (primary) hypertension: Secondary | ICD-10-CM

## 2016-12-07 NOTE — Patient Instructions (Addendum)
[name] , Thank you for taking time to come for your Medicare Wellness Visit. I appreciate your ongoing commitment to your health goals. Please review the following plan we discussed and let me know if I can assist you in the future.   These are the goals we discussed: Goals    . Increase physical activity       This is a list of the screening recommended for you and due dates:  Health Maintenance  Topic Date Due  . Flu Shot  12/15/2016  . Tetanus Vaccine  11/21/2023  . Pneumonia vaccines  Completed   Bring a copy of your advance directives to your next office visit.  Discuss Shingrix vaccine with PCP at next office visit.    Fall Prevention in the Home Falls can cause injuries. They can happen to people of all ages. There are many things you can do to make your home safe and to help prevent falls. What can I do on the outside of my home?  Regularly fix the edges of walkways and driveways and fix any cracks.  Remove anything that might make you trip as you walk through a door, such as a raised step or threshold.  Trim any bushes or trees on the path to your home.  Use bright outdoor lighting.  Clear any walking paths of anything that might make someone trip, such as rocks or tools.  Regularly check to see if handrails are loose or broken. Make sure that both sides of any steps have handrails.  Any raised decks and porches should have guardrails on the edges.  Have any leaves, snow, or ice cleared regularly.  Use sand or salt on walking paths during winter.  Clean up any spills in your garage right away. This includes oil or grease spills. What can I do in the bathroom?  Use night lights.  Install grab bars by the toilet and in the tub and shower. Do not use towel bars as grab bars.  Use non-skid mats or decals in the tub or shower.  If you need to sit down in the shower, use a plastic, non-slip stool.  Keep the floor dry. Clean up any water that spills on the  floor as soon as it happens.  Remove soap buildup in the tub or shower regularly.  Attach bath mats securely with double-sided non-slip rug tape.  Do not have throw rugs and other things on the floor that can make you trip. What can I do in the bedroom?  Use night lights.  Make sure that you have a light by your bed that is easy to reach.  Do not use any sheets or blankets that are too big for your bed. They should not hang down onto the floor.  Have a firm chair that has side arms. You can use this for support while you get dressed.  Do not have throw rugs and other things on the floor that can make you trip. What can I do in the kitchen?  Clean up any spills right away.  Avoid walking on wet floors.  Keep items that you use a lot in easy-to-reach places.  If you need to reach something above you, use a strong step stool that has a grab bar.  Keep electrical cords out of the way.  Do not use floor polish or wax that makes floors slippery. If you must use wax, use non-skid floor wax.  Do not have throw rugs and other things on  the floor that can make you trip. What can I do with my stairs?  Do not leave any items on the stairs.  Make sure that there are handrails on both sides of the stairs and use them. Fix handrails that are broken or loose. Make sure that handrails are as long as the stairways.  Check any carpeting to make sure that it is firmly attached to the stairs. Fix any carpet that is loose or worn.  Avoid having throw rugs at the top or bottom of the stairs. If you do have throw rugs, attach them to the floor with carpet tape.  Make sure that you have a light switch at the top of the stairs and the bottom of the stairs. If you do not have them, ask someone to add them for you. What else can I do to help prevent falls?  Wear shoes that: ? Do not have high heels. ? Have rubber bottoms. ? Are comfortable and fit you well. ? Are closed at the toe. Do not wear  sandals.  If you use a stepladder: ? Make sure that it is fully opened. Do not climb a closed stepladder. ? Make sure that both sides of the stepladder are locked into place. ? Ask someone to hold it for you, if possible.  Clearly mark and make sure that you can see: ? Any grab bars or handrails. ? First and last steps. ? Where the edge of each step is.  Use tools that help you move around (mobility aids) if they are needed. These include: ? Canes. ? Walkers. ? Scooters. ? Crutches.  Turn on the lights when you go into a dark area. Replace any light bulbs as soon as they burn out.  Set up your furniture so you have a clear path. Avoid moving your furniture around.  If any of your floors are uneven, fix them.  If there are any pets around you, be aware of where they are.  Review your medicines with your doctor. Some medicines can make you feel dizzy. This can increase your chance of falling. Ask your doctor what other things that you can do to help prevent falls. This information is not intended to replace advice given to you by your health care provider. Make sure you discuss any questions you have with your health care provider. Document Released: 02/27/2009 Document Revised: 10/09/2015 Document Reviewed: 06/07/2014 Elsevier Interactive Patient Education  2018 [employer].   Health Maintenance, Male A healthy lifestyle and preventive care is important for your health and wellness. Ask your health care provider about what schedule of regular examinations is right for you. What should I know about weight and diet? Eat a Healthy Diet  Eat plenty of vegetables, fruits, whole grains, low-fat dairy products, and lean protein.  Do not eat a lot of foods high in solid fats, added sugars, or salt.  Maintain a Healthy Weight Regular exercise can help you achieve or maintain a healthy weight. You should:  Do at least 150 minutes of exercise each week. The exercise should increase  your heart rate and make you sweat (moderate-intensity exercise).  Do strength-training exercises at least twice a week.  Watch Your Levels of Cholesterol and Blood Lipids  Have your blood tested for lipids and cholesterol every 5 years starting at 81 years of age. If you are at high risk for heart disease, you should start having your blood tested when you are 81 years old. You may need to have  your cholesterol levels checked more often if: ? Your lipid or cholesterol levels are high. ? You are older than 81 years of age. ? You are at high risk for heart disease.  What should I know about cancer screening? Many types of cancers can be detected early and may often be prevented. Lung Cancer  You should be screened every year for lung cancer if: ? You are a current smoker who has smoked for at least 30 years. ? You are a former smoker who has quit within the past 15 years.  Talk to your health care provider about your screening options, when you should start screening, and how often you should be screened.  Colorectal Cancer  Routine colorectal cancer screening usually begins at 81 years of age and should be repeated every 5-10 years until you are 81 years old. You may need to be screened more often if early forms of precancerous polyps or small growths are found. Your health care provider may recommend screening at an earlier age if you have risk factors for colon cancer.  Your health care provider may recommend using home test kits to check for hidden blood in the stool.  A small camera at the end of a tube can be used to examine your colon (sigmoidoscopy or colonoscopy). This checks for the earliest forms of colorectal cancer.  Prostate and Testicular Cancer  Depending on your age and overall health, your health care provider may do certain tests to screen for prostate and testicular cancer.  Talk to your health care provider about any symptoms or concerns you have about testicular  or prostate cancer.  Skin Cancer  Check your skin from head to toe regularly.  Tell your health care provider about any new moles or changes in moles, especially if: ? There is a change in a mole's size, shape, or color. ? You have a mole that is larger than a pencil eraser.  Always use sunscreen. Apply sunscreen liberally and repeat throughout the day.  Protect yourself by wearing long sleeves, pants, a wide-brimmed hat, and sunglasses when outside.  What should I know about heart disease, diabetes, and high blood pressure?  If you are 76-21 years of age, have your blood pressure checked every 3-5 years. If you are 30 years of age or older, have your blood pressure checked every year. You should have your blood pressure measured twice-once when you are at a hospital or clinic, and once when you are not at a hospital or clinic. Record the average of the two measurements. To check your blood pressure when you are not at a hospital or clinic, you can use: ? An automated blood pressure machine at a pharmacy. ? A home blood pressure monitor.  Talk to your health care provider about your target blood pressure.  If you are between 43-30 years old, ask your health care provider if you should take aspirin to prevent heart disease.  Have regular diabetes screenings by checking your fasting blood sugar level. ? If you are at a normal weight and have a low risk for diabetes, have this test once every three years after the age of 66. ? If you are overweight and have a high risk for diabetes, consider being tested at a younger age or more often.  A one-time screening for abdominal aortic aneurysm (AAA) by ultrasound is recommended for men aged 65-75 years who are current or former smokers. What should I know about preventing infection? Hepatitis B If you  have a higher risk for hepatitis B, you should be screened for this virus. Talk with your health care provider to find out if you are at risk for  hepatitis B infection. Hepatitis C Blood testing is recommended for:  Everyone born from [birth year] through 1963.  Anyone with known risk factors for hepatitis C.  Sexually Transmitted Diseases (STDs)  You should be screened each year for STDs including gonorrhea and chlamydia if: ? You are sexually active and are younger than 81 years of age. ? You are older than 81 years of age and your health care provider tells you that you are at risk for this type of infection. ? Your sexual activity has changed since you were last screened and you are at an increased risk for chlamydia or gonorrhea. Ask your health care provider if you are at risk.  Talk with your health care provider about whether you are at high risk of being infected with HIV. Your health care provider may recommend a prescription medicine to help prevent HIV infection.  What else can I do?  Schedule regular health, dental, and eye exams.  Stay current with your vaccines (immunizations).  Do not use any tobacco products, such as cigarettes, chewing tobacco, and e-cigarettes. If you need help quitting, ask your health care provider.  Limit alcohol intake to no more than 2 drinks per day. One drink equals 12 ounces of beer, 5 ounces of wine, or 1 ounces of hard liquor.  Do not use street drugs.  Do not share needles.  Ask your health care provider for help if you need support or information about quitting drugs.  Tell your health care provider if you often feel depressed.  Tell your health care provider if you have ever been abused or do not feel safe at home. This information is not intended to replace advice given to you by your health care provider. Make sure you discuss any questions you have with your health care provider. Document Released: 10/30/2007 Document Revised: 12/31/2015 Document Reviewed: 02/04/2015 Elsevier Interactive Patient Education  [employer].

## 2016-12-07 NOTE — Progress Notes (Signed)
Subjective:    Patient ID: [name], male    DOB: June 20, 1926, 83 y.o.   MRN: [id number]  HPI  83 year old man who  has a past medical history of A-fib (HCC); Arthritis; Asthma; BPH (benign prostatic hypertrophy); Bronchospasm, exercise-induced; CHF (congestive heart failure) (HCC); Chronic kidney disease; Dyslipidemia; Edema; GERD (gastroesophageal reflux disease); Hypertension; Macular degeneration (08-06-13); Obesity; and Shortness of breath.  He presents to the office today for loss of hearing. He feels as though both of his ears are clogged. He denies any drainage, headaches, or fevers  Review of Systems See HPI   Past Medical History:  Diagnosis Date  . A-fib (HCC)   . Arthritis    knees  . Asthma   . BPH (benign prostatic hypertrophy)   . Bronchospasm, exercise-induced   . CHF (congestive heart failure) (HCC)    systolic, EF 40% (07/07/2012)  . Chronic kidney disease    stabilized, due to infection  . Dyslipidemia   . Edema    Bilateral - left lower leg greater than right- wears compression stockings  . GERD (gastroesophageal reflux disease)   . Hypertension   . Macular degeneration 08-06-13   bilateral -sight impaired  . Obesity   . Shortness of breath     Social History   Social History  . Marital status: Widowed    Spouse name: N/A  . Number of children: N/A  . Years of education: N/A   Occupational History  . retired Retired   Social History Main Topics  . Smoking status: Former Smoker    Quit date: 05/17/1964  . Smokeless tobacco: Never Used  . Alcohol use No  . Drug use: No  . Sexual activity: Not Currently   Other Topics Concern  . Not on file   Social History Narrative  . No narrative on file    Past Surgical History:  Procedure Laterality Date  . CARDIOVERSION N/A 07/07/2012   Procedure: CARDIOVERSION;  Surgeon: Chrystie Nose, MD;  Location: [facility] ENDOSCOPY;  Service: Cardiovascular;  Laterality: N/A;  . CARDIOVERSION N/A 08/30/2012     Procedure: CARDIOVERSION;  Surgeon: Chrystie Nose, MD;  Location: [facility] ENDOSCOPY;  Service: Cardiovascular;  Laterality: N/A;  . TOTAL HIP ARTHROPLASTY Left 08/13/2013   Procedure: LEFT TOTAL HIP ARTHROPLASTY ANTERIOR APPROACH;  Surgeon: [name], MD;  Location: [facility] ORS;  Service: Orthopedics;  Laterality: Left;  . TRANSTHORACIC ECHOCARDIOGRAM  07/07/2012   ef 40%; mild MR; LA mod-severely dilated; RA mildly dilated; RV systolic function mildly reduced; RV systolic pressure increase - mod pulm htn  . VASCULAR SURGERY  removed varicose veins  . VASECTOMY      Family History  Problem Relation Age of Onset  . Alzheimer's disease Mother   . Heart disease Father     Allergies  Allergen Reactions  . Nsaids     Stomach pain  . Other     Opiates cause tightness in chest  . Vicodin [Hydrocodone-Acetaminophen] Other (See Comments)    Unknown reaction many years ago  . Morphine And Related Nausea And Vomiting    Dizziness, light headed     Current Outpatient Prescriptions on File Prior to Visit  Medication Sig Dispense Refill  . acetaminophen (TYLENOL) 500 MG tablet Take 500 mg by mouth 3 (three) times daily. Takes with Tramadol    . albuterol (PROVENTIL HFA;VENTOLIN HFA) 108 (90 BASE) MCG/ACT inhaler Inhale 1-2 puffs into the lungs every 6 (six) hours as needed for wheezing  or shortness of breath. 1 Inhaler 3  . amiodarone (PACERONE) 200 MG tablet TAKE ONE TABLET BY MOUTH ONCE DAILY 90 tablet 3  . bumetanide (BUMEX) 1 MG tablet Take 1 tablet (1 mg total) by mouth 2 (two) times daily. 60 tablet 5  . Calcium Carbonate-Vitamin D (CALCIUM 600+D) 600-400 MG-UNIT per tablet Take 1 tablet by mouth 3 (three) times a week. Monday Wednesday and Friday    . cetirizine (ZYRTEC) 10 MG tablet Take 10 mg by mouth daily as needed for allergies.    [name] docusate sodium 100 MG CAPS Take 100 mg by mouth 2 (two) times daily. 10 capsule 0  . gabapentin (NEURONTIN) 400 MG capsule TAKE ONE CAPSULE BY MOUTH  THREE TIMES DAILY 270 capsule 1  . KLOR-CON M20 20 MEQ tablet TAKE ONE TABLET BY MOUTH ONCE DAILY 60 tablet 5  . Magnesium 250 MG TABS Take 250 mg by mouth daily.     . midodrine (PROAMATINE) 10 MG tablet TAKE ONE TABLET BY MOUTH ONCE DAILY AS NEEDED FOR  SYSTOLIC  BLOOD  PRESSURE  (TOP  NUMBER)  UNDER  90 90 tablet 2  . Multiple Vitamin (MULTIVITAMIN WITH MINERALS) TABS tablet Take 1 tablet by mouth daily.    . Multiple Vitamins-Minerals (ICAPS AREDS FORMULA PO) Take 2 capsules by mouth 2 (two) times daily.    [name] omeprazole (PRILOSEC) 20 MG capsule Take 1 capsule (20 mg total) by mouth daily. 90 capsule 1  . polyethylene glycol (MIRALAX / GLYCOLAX) packet Take 17 g by mouth 2 (two) times daily. 14 each 0  . rivaroxaban (XARELTO) 20 MG TABS tablet Take 1 tablet (20 mg total) by mouth daily. 21 tablet 0  . rosuvastatin (CRESTOR) 20 MG tablet Take 1 tablet (20 mg total) by mouth once a week. Friday 12 tablet 2  . senna (SENOKOT) 8.6 MG TABS tablet Take 1 tablet by mouth daily as needed for mild constipation.    [name] SPIRIVA HANDIHALER 18 MCG inhalation capsule INHALE ONE PUFF BY MOUTH ONCE DAILY 90 capsule 3  . tamsulosin (FLOMAX) 0.4 MG CAPS capsule TAKE ONE CAPSULE BY MOUTH ONCE DAILY 30 capsule 5  . traMADol (ULTRAM) 50 MG tablet TAKE 1 TO 2 TABLETS BY MOUTH EVERY 6 HOURS AS NEEDED 100 tablet 2  . zolpidem (AMBIEN) 5 MG tablet Take 1 tablet (5 mg total) by mouth at bedtime as needed for sleep. 30 tablet 5   No current facility-administered medications on file prior to visit.     Temp 98 F (36.7 C) (Oral)       Objective:   Physical Exam  Constitutional: He is oriented to person, place, and time. He appears well-developed and well-nourished. No distress.  HENT:  Cerumen impaction in bilateral ears canals.   Cardiovascular: Normal rate, regular rhythm, normal heart sounds and intact distal pulses.  Exam reveals no gallop and no friction rub.   No murmur heard. Pulmonary/Chest: Effort  normal and breath sounds normal. No respiratory distress. He has no wheezes. He has no rales. He exhibits no tenderness.  Neurological: He is alert and oriented to person, place, and time.  Skin: He is not diaphoretic.  Nursing note and vitals reviewed.     Assessment & Plan:  Bilateral impacted cerumen - Bilateral ears were easily irrigated and cerumen impactions were removed.  - Patient tolerated procedure well - TM's visualized. No signs of infection   Shirline Frees, NP

## 2016-12-07 NOTE — Telephone Encounter (Signed)
Dropped off form to be completed.   -Mail form upon completion to:    [employer]   [address]   [place], [place] [address]

## 2016-12-08 ENCOUNTER — Ambulatory Visit: Payer: Medicare Other | Admitting: Adult Health

## 2016-12-23 ENCOUNTER — Other Ambulatory Visit: Payer: Self-pay | Admitting: Internal Medicine

## 2017-01-11 NOTE — Patient Instructions (Addendum)
[name]  01/11/2017   Your procedure is scheduled on: 01-24-17  Report to [facility] Main  Entrance Follow signs to Short Stay on first floor at 5:15 AM    Call this number if you have problems the morning of surgery [phone]   Remember: ONLY 1 PERSON MAY GO WITH YOU TO SHORT STAY TO GET  READY MORNING OF YOUR SURGERY.  Do not eat food or drink liquids :After Midnight.     Take these medicines the morning of surgery with A SIP OF WATER: None, per patient's preference.  You may bring and use any inhaler that you might need.                                You may not have any metal on your body including hair pins and              piercings  Do not wear jewelry, make-up, lotions, powders or perfumes, deodorant             Men may shave face and neck.   Do not bring valuables to the hospital. [facility] IS NOT             RESPONSIBLE   FOR VALUABLES.  Contacts, dentures or bridgework may not be worn into surgery.  Leave suitcase in the car. After surgery it may be brought to your room.                Please read over the following fact sheets you were given: _____________________________________________________________________             [facility] - Preparing for Surgery Before surgery, you can play an important role.  Because skin is not sterile, your skin needs to be as free of germs as possible.  You can reduce the number of germs on your skin by washing with CHG (chlorahexidine gluconate) soap before surgery.  CHG is an antiseptic cleaner which kills germs and bonds with the skin to continue killing germs even after washing. Please DO NOT use if you have an allergy to CHG or antibacterial soaps.  If your skin becomes reddened/irritated stop using the CHG and inform your nurse when you arrive at Short Stay. Do not shave (including legs and underarms) for at least 48 hours prior to the first CHG shower.  You may shave your face/neck. Please  follow these instructions carefully:  1.  Shower with CHG Soap the night before surgery and the  morning of Surgery.  2.  If you choose to wash your hair, wash your hair first as usual with your  normal  shampoo.  3.  After you shampoo, rinse your hair and body thoroughly to remove the  shampoo.                           4.  Use CHG as you would any other liquid soap.  You can apply chg directly  to the skin and wash                       Gently with a scrungie or clean washcloth.  5.  Apply the CHG Soap to your body ONLY FROM THE NECK DOWN.   Do not use on face/ open  Wound or open sores. Avoid contact with eyes, ears mouth and genitals (private parts).                       Wash face,  Genitals (private parts) with your normal soap.             6.  Wash thoroughly, paying special attention to the area where your surgery  will be performed.  7.  Thoroughly rinse your body with warm water from the neck down.  8.  DO NOT shower/wash with your normal soap after using and rinsing off  the CHG Soap.                9.  Pat yourself dry with a clean towel.            10.  Wear clean pajamas.            11.  Place clean sheets on your bed the night of your first shower and do not  sleep with pets. Day of Surgery : Do not apply any lotions/deodorants the morning of surgery.  Please wear clean clothes to the hospital/surgery center.  FAILURE TO FOLLOW THESE INSTRUCTIONS MAY RESULT IN THE CANCELLATION OF YOUR SURGERY PATIENT SIGNATURE_________________________________  NURSE SIGNATURE__________________________________  ________________________________________________________________________   Adam Phenix  An incentive spirometer is a tool that can help keep your lungs clear and active. This tool measures how well you are filling your lungs with each breath. Taking long deep breaths may help reverse or decrease the chance of developing breathing (pulmonary) problems  (especially infection) following:  A long period of time when you are unable to move or be active. BEFORE THE PROCEDURE   If the spirometer includes an indicator to show your best effort, your nurse or respiratory therapist will set it to a desired goal.  If possible, sit up straight or lean slightly forward. Try not to slouch.  Hold the incentive spirometer in an upright position. INSTRUCTIONS FOR USE  1. Sit on the edge of your bed if possible, or sit up as far as you can in bed or on a chair. 2. Hold the incentive spirometer in an upright position. 3. Breathe out normally. 4. Place the mouthpiece in your mouth and seal your lips tightly around it. 5. Breathe in slowly and as deeply as possible, raising the piston or the ball toward the top of the column. 6. Hold your breath for 3-5 seconds or for as long as possible. Allow the piston or ball to fall to the bottom of the column. 7. Remove the mouthpiece from your mouth and breathe out normally. 8. Rest for a few seconds and repeat Steps 1 through 7 at least 10 times every 1-2 hours when you are awake. Take your time and take a few normal breaths between deep breaths. 9. The spirometer may include an indicator to show your best effort. Use the indicator as a goal to work toward during each repetition. 10. After each set of 10 deep breaths, practice coughing to be sure your lungs are clear. If you have an incision (the cut made at the time of surgery), support your incision when coughing by placing a pillow or rolled up towels firmly against it. Once you are able to get out of bed, walk around indoors and cough well. You may stop using the incentive spirometer when instructed by your caregiver.  RISKS AND COMPLICATIONS  Take your time so you do not get  dizzy or light-headed.  If you are in pain, you may need to take or ask for pain medication before doing incentive spirometry. It is harder to take a deep breath if you are having  pain. AFTER USE  Rest and breathe slowly and easily.  It can be helpful to keep track of a log of your progress. Your caregiver can provide you with a simple table to help with this. If you are using the spirometer at home, follow these instructions: [place] IF:   You are having difficultly using the spirometer.  You have trouble using the spirometer as often as instructed.  Your pain medication is not giving enough relief while using the spirometer.  You develop fever of 100.5 F (38.1 C) or higher. SEEK IMMEDIATE MEDICAL CARE IF:   You cough up bloody sputum that had not been present before.  You develop fever of 102 F (38.9 C) or greater.  You develop worsening pain at or near the incision site. MAKE SURE YOU:   Understand these instructions.  Will watch your condition.  Will get help right away if you are not doing well or get worse. Document Released: 09/13/2006 Document Revised: 07/26/2011 Document Reviewed: 11/14/2006 Exit[employer] Patient Information 2014 Exit[employer], [place].   ________________________________________________________________________  WHAT IS A BLOOD TRANSFUSION? Blood Transfusion Information  A transfusion is the replacement of blood or some of its parts. Blood is made up of multiple cells which provide different functions.  Red blood cells carry oxygen and are used for blood loss replacement.  White blood cells fight against infection.  Platelets control bleeding.  Plasma helps clot blood.  Other blood products are available for specialized needs, such as hemophilia or other clotting disorders. BEFORE THE TRANSFUSION  Who gives blood for transfusions?   Healthy volunteers who are fully evaluated to make sure their blood is safe. This is blood bank blood. Transfusion therapy is the safest it has ever been in the practice of medicine. Before blood is taken from a donor, a complete history is taken to make sure that person has no history  of diseases nor engages in risky social behavior (examples are intravenous drug use or sexual activity with multiple partners). The donor's travel history is screened to minimize risk of transmitting infections, such as malaria. The donated blood is tested for signs of infectious diseases, such as HIV and hepatitis. The blood is then tested to be sure it is compatible with you in order to minimize the chance of a transfusion reaction. If you or a relative donates blood, this is often done in anticipation of surgery and is not appropriate for emergency situations. It takes many days to process the donated blood. RISKS AND COMPLICATIONS Although transfusion therapy is very safe and saves many lives, the main dangers of transfusion include:   Getting an infectious disease.  Developing a transfusion reaction. This is an allergic reaction to something in the blood you were given. Every precaution is taken to prevent this. The decision to have a blood transfusion has been considered carefully by your caregiver before blood is given. Blood is not given unless the benefits outweigh the risks. AFTER THE TRANSFUSION  Right after receiving a blood transfusion, you will usually feel much better and more energetic. This is especially true if your red blood cells have gotten low (anemic). The transfusion raises the level of the red blood cells which carry oxygen, and this usually causes an energy increase.  The nurse administering the transfusion will  monitor you carefully for complications. HOME CARE INSTRUCTIONS  No special instructions are needed after a transfusion. You may find your energy is better. Speak with your caregiver about any limitations on activity for underlying diseases you may have. SEEK MEDICAL CARE IF:   Your condition is not improving after your transfusion.  You develop redness or irritation at the intravenous (IV) site. SEEK IMMEDIATE MEDICAL CARE IF:  Any of the following symptoms  occur over the next 12 hours:  Shaking chills.  You have a temperature by mouth above 102 F (38.9 C), not controlled by medicine.  Chest, back, or muscle pain.  People around you feel you are not acting correctly or are confused.  Shortness of breath or difficulty breathing.  Dizziness and fainting.  You get a rash or develop hives.  You have a decrease in urine output.  Your urine turns a dark color or changes to pink, red, or brown. Any of the following symptoms occur over the next 10 days:  You have a temperature by mouth above 102 F (38.9 C), not controlled by medicine.  Shortness of breath.  Weakness after normal activity.  The white part of the eye turns yellow (jaundice).  You have a decrease in the amount of urine or are urinating less often.  Your urine turns a dark color or changes to pink, red, or brown. Document Released: 04/30/2000 Document Revised: 07/26/2011 Document Reviewed: 12/18/2007 [facility] Patient Information 2014 [place], [place].  _______________________________________________________________________

## 2017-01-11 NOTE — Progress Notes (Addendum)
11-05-16 Cardiac clearance in Dr. Blanchie Dessert office note " acceptable risk for surgery". F/u in 6 months  10-29-16 (EPIC) Stress, EF 64% No ischemia/infarct   10-15-16 (EPIC)  EKG w/ R BBB. Bifasicular block  09-04-15 (EPIC) ECHO mild stenosis, trivial regurgitation   01-12-17 Discussed the above with Dr. Kelle Darting. No new orders.  Pt can proceed with surgery.

## 2017-01-12 ENCOUNTER — Encounter (HOSPITAL_COMMUNITY): Payer: Self-pay

## 2017-01-12 ENCOUNTER — Encounter (HOSPITAL_COMMUNITY)
Admission: RE | Admit: 2017-01-12 | Discharge: 2017-01-12 | Disposition: A | Discharge status: Home / Self Care | Payer: Medicare Other | Source: Ambulatory Visit | Attending: Orthopedic Surgery | Admitting: Orthopedic Surgery

## 2017-01-12 DIAGNOSIS — Z01818 Encounter for other preprocedural examination: Secondary | ICD-10-CM | POA: Diagnosis present

## 2017-01-12 DIAGNOSIS — M1712 Unilateral primary osteoarthritis, left knee: Secondary | ICD-10-CM | POA: Insufficient documentation

## 2017-01-12 LAB — BASIC METABOLIC PANEL
Anion gap: 6 (ref 5–15)
BUN: 14 mg/dL (ref 6–20)
CALCIUM: 9.1 mg/dL (ref 8.9–10.3)
CO2: 34 mmol/L — AB (ref 22–32)
CREATININE: 0.92 mg/dL (ref 0.61–1.24)
Chloride: 95 mmol/L — ABNORMAL LOW (ref 101–111)
GFR calc non Af Amer: >60 mL/min (ref >60)
GLUCOSE: 99 mg/dL (ref 65–99)
Potassium: 4 mmol/L (ref 3.5–5.1)
Sodium: 135 mmol/L (ref 135–145)

## 2017-01-12 LAB — SURGICAL PCR SCREEN
MRSA, PCR: NEGATIVE
Staphylococcus aureus: NEGATIVE

## 2017-01-12 LAB — CBC
HCT: 33.4 % — ABNORMAL LOW (ref 39.0–52.0)
Hemoglobin: 11.2 g/dL — ABNORMAL LOW (ref 13.0–17.0)
MCH: 31.8 pg (ref 26.0–34.0)
MCHC: 33.5 g/dL (ref 30.0–36.0)
MCV: 94.9 fL (ref 78.0–100.0)
PLATELETS: 225 10*3/uL (ref 150–400)
RBC: 3.52 MIL/uL — AB (ref 4.22–5.81)
RDW: 14.6 % (ref 11.5–15.5)
WBC: 5.3 10*3/uL (ref 4.0–10.5)

## 2017-01-12 NOTE — H&P (Signed)
TOTAL KNEE ADMISSION H&P  Patient is being admitted for left total knee arthroplasty.  Subjective:  Chief Complaint:    Left knee primary OA / pain  HPI: [name], 83 y.o. male, has a history of pain and functional disability in the left knee due to arthritis and has failed non-surgical conservative treatments for greater than 12 weeks to include NSAID's and/or analgesics, corticosteriod injections, viscosupplementation injections, use of assistive devices and activity modification.  Onset of symptoms was gradual, starting 5-6 years ago with gradually worsening course since that time. The patient noted no past surgery on the left knee(s).  Patient currently rates pain in the left knee(s) at 10 out of 10 with activity. Patient has night pain, worsening of pain with activity and weight bearing, pain that interferes with activities of daily living, pain with passive range of motion, crepitus and joint swelling.  Patient has evidence of periarticular osteophytes and joint space narrowing by imaging studies.  There is no active infection.   Risks, benefits and expectations were discussed with the patient.  Risks including but not limited to the risk of anesthesia, blood clots, nerve damage, blood vessel damage, failure of the prosthesis, infection and up to and including death.  Patient understand the risks, benefits and expectations and wishes to proceed with surgery.   PCP: [name], MD  D/C Plans:       Home  Post-op Meds:       No Rx given  Tranexamic Acid:      To be given - IV  Decadron:      Is to be given  FYI:     Xarelto (on pre-op)  Tramadol  DME:   Pt already has equipment  PT:   Rx given for OPPT    Patient Active Problem List   Diagnosis Date Noted  . Impacted cerumen of left ear 11/05/2016  . Preoperative cardiovascular examination 10/15/2016  . Non-ischemic cardiomyopathy (HCC) 10/15/2016  . Chronic systolic congestive heart failure (HCC) 05/28/2016  .  Orthostatic hypotension 09/20/2015  . Impaired glucose tolerance 11/25/2014  . Bifascicular block 12/18/2013  . Bilateral edema of lower extremity 10/25/2013  . CHF (congestive heart failure) (HCC) 08/30/2013  . S/P left THA, AA 08/13/2013  . Chronic anticoagulation 12/06/2012  . Near syncope 12/06/2012  . Acute systolic CHF (congestive heart failure), NYHA class 3 -- Unclear etilogy (? Afib related) EF down from 60-70% to 40%. 07/05/2012  . General weakness 07/05/2012  . Paroxysmal Atrial fibrillation - admitted with RVR 01/18/2012  . DJD (degenerative joint disease), lumbar 01/18/2012  . Hx of colonic polyps 11/02/2011  . BENIGN PROSTATIC HYPERTROPHY 05/13/2010  . SEBORRHEA CAPITIS 02/13/2010  . History of elevated glucose 10/01/2009  . CHRONIC RHINITIS 06/02/2009  . Dyslipidemia 11/01/2008  . INSOMNIA, CHRONIC, MILD 01/02/2008  . Essential hypertension 02/06/2007  . ASTHMA 02/06/2007   Past Medical History:  Diagnosis Date  . A-fib (HCC)   . Arthritis    knees  . Asthma   . BPH (benign prostatic hypertrophy)   . Bronchospasm, exercise-induced   . CHF (congestive heart failure) (HCC)    systolic, EF 40% (07/07/2012)  . Chronic kidney disease    stabilized, due to infection  . Dyslipidemia   . Edema    Bilateral - left lower leg greater than right- wears compression stockings  . GERD (gastroesophageal reflux disease)   . Hypertension   . Macular degeneration 08-06-13   bilateral -sight impaired  . Obesity   .  Shortness of breath     Past Surgical History:  Procedure Laterality Date  . CARDIOVERSION N/A 07/07/2012   Procedure: CARDIOVERSION;  Surgeon: Chrystie Nose, MD;  Location: [facility] ENDOSCOPY;  Service: Cardiovascular;  Laterality: N/A;  . CARDIOVERSION N/A 08/30/2012   Procedure: CARDIOVERSION;  Surgeon: Chrystie Nose, MD;  Location: [facility] ENDOSCOPY;  Service: Cardiovascular;  Laterality: N/A;  . TOTAL HIP ARTHROPLASTY Left 08/13/2013   Procedure: LEFT TOTAL HIP  ARTHROPLASTY ANTERIOR APPROACH;  Surgeon: [name], MD;  Location: [facility] ORS;  Service: Orthopedics;  Laterality: Left;  . TRANSTHORACIC ECHOCARDIOGRAM  07/07/2012   ef 40%; mild MR; LA mod-severely dilated; RA mildly dilated; RV systolic function mildly reduced; RV systolic pressure increase - mod pulm htn  . VASCULAR SURGERY  removed varicose veins  . VASECTOMY      No prescriptions prior to admission.   Allergies  Allergen Reactions  . Nsaids     Stomach pain  . Vicodin [Hydrocodone-Acetaminophen] Other (See Comments)    Unknown reaction many years ago  . Morphine And Related Nausea And Vomiting    Dizziness, light headed. "Opiates cause tightness in chest".    Social History  Substance Use Topics  . Smoking status: Former Smoker    Quit date: 05/17/1964  . Smokeless tobacco: Never Used  . Alcohol use No    Family History  Problem Relation Age of Onset  . Alzheimer's disease Mother   . Heart disease Father      Review of Systems  HENT: Negative.   Eyes: Negative.   Respiratory: Negative.   Cardiovascular: Positive for leg swelling.  Gastrointestinal: Negative.   Genitourinary: Negative.   Musculoskeletal: Positive for joint pain.  Skin: Negative.   Neurological: Positive for weakness.  Endo/Heme/Allergies: Negative.   Psychiatric/Behavioral: Negative.     Objective:  Physical Exam  Constitutional: He is oriented to person, place, and time. He appears well-developed.  HENT:  Head: Normocephalic.  Eyes: Pupils are equal, round, and reactive to light.  Neck: Neck supple. No JVD present. No tracheal deviation present. No thyromegaly present.  Cardiovascular: Normal rate, regular rhythm and intact distal pulses.   Respiratory: Effort normal and breath sounds normal. No respiratory distress. He has no wheezes.  GI: Soft. There is no tenderness. There is no guarding.  Musculoskeletal:       Left knee: He exhibits decreased range of motion, swelling and bony  tenderness. He exhibits no ecchymosis, no deformity, no laceration and no erythema. Tenderness found.  Lymphadenopathy:    He has no cervical adenopathy.  Neurological: He is alert and oriented to person, place, and time.  Skin: Skin is warm and dry.  Psychiatric: He has a normal mood and affect.    Vital signs in last 24 hours: Temp:  [98.4 F (36.9 C)] 98.4 F (36.9 C) (08/29 0823) Pulse Rate:  [68] 68 (08/29 0823) Resp:  [20] 20 (08/29 0823) BP: (120)/(61) 120/61 (08/29 0823) SpO2:  [99 %] 99 % (08/29 0823) Weight:  [92.3 kg (203 lb 8 oz)] 92.3 kg (203 lb 8 oz) (08/29 0823)  Labs:   Estimated body mass index is 32.85 kg/m as calculated from the following:   Height as of 01/12/17: 5\' 6"  (1.676 m).   Weight as of 01/12/17: 92.3 kg (203 lb 8 oz).   Imaging Review Plain radiographs demonstrate severe degenerative joint disease of the left knee(s).  The bone quality appears to be good for age and reported activity level.  Assessment/Plan:  End stage arthritis, left knee   The patient history, physical examination, clinical judgment of the provider and imaging studies are consistent with end stage degenerative joint disease of the left knee(s) and total knee arthroplasty is deemed medically necessary. The treatment options including medical management, injection therapy arthroscopy and arthroplasty were discussed at length. The risks and benefits of total knee arthroplasty were presented and reviewed. The risks due to aseptic loosening, infection, stiffness, patella tracking problems, thromboembolic complications and other imponderables were discussed. The patient acknowledged the explanation, agreed to proceed with the plan and consent was signed. Patient is being admitted for inpatient treatment for surgery, pain control, PT, OT, prophylactic antibiotics, VTE prophylaxis, progressive ambulation and ADL's and discharge planning. The patient is planning to be discharged  home.      Anastasio Auerbach Brittinee Risk   [name]  01/12/2017, 11:58 AM

## 2017-01-13 ENCOUNTER — Other Ambulatory Visit: Payer: Self-pay | Admitting: Internal Medicine

## 2017-01-24 ENCOUNTER — Encounter (HOSPITAL_COMMUNITY)
Admission: RE | Disposition: A | Discharge status: Skilled Nursing Facility | Payer: Self-pay | Source: Ambulatory Visit | Attending: Orthopedic Surgery

## 2017-01-24 ENCOUNTER — Inpatient Hospital Stay (HOSPITAL_COMMUNITY): Payer: Medicare Other | Admitting: Registered Nurse

## 2017-01-24 ENCOUNTER — Inpatient Hospital Stay (HOSPITAL_COMMUNITY)
Admission: RE | Admit: 2017-01-24 | Discharge: 2017-01-28 | DRG: 470 | Disposition: A | Discharge status: Skilled Nursing Facility | Payer: Medicare Other | Source: Ambulatory Visit | Attending: Orthopedic Surgery | Admitting: Orthopedic Surgery

## 2017-01-24 ENCOUNTER — Encounter (HOSPITAL_COMMUNITY): Payer: Self-pay

## 2017-01-24 DIAGNOSIS — Z6832 Body mass index (BMI) 32.0-32.9, adult: Secondary | ICD-10-CM

## 2017-01-24 DIAGNOSIS — J45909 Unspecified asthma, uncomplicated: Secondary | ICD-10-CM | POA: Diagnosis present

## 2017-01-24 DIAGNOSIS — I480 Paroxysmal atrial fibrillation: Secondary | ICD-10-CM | POA: Diagnosis present

## 2017-01-24 DIAGNOSIS — Z96642 Presence of left artificial hip joint: Secondary | ICD-10-CM | POA: Diagnosis present

## 2017-01-24 DIAGNOSIS — M659 Synovitis and tenosynovitis, unspecified: Secondary | ICD-10-CM | POA: Diagnosis present

## 2017-01-24 DIAGNOSIS — E785 Hyperlipidemia, unspecified: Secondary | ICD-10-CM | POA: Diagnosis present

## 2017-01-24 DIAGNOSIS — I130 Hypertensive heart and chronic kidney disease with heart failure and stage 1 through stage 4 chronic kidney disease, or unspecified chronic kidney disease: Secondary | ICD-10-CM | POA: Diagnosis present

## 2017-01-24 DIAGNOSIS — I429 Cardiomyopathy, unspecified: Secondary | ICD-10-CM | POA: Diagnosis present

## 2017-01-24 DIAGNOSIS — H3530 Unspecified macular degeneration: Secondary | ICD-10-CM | POA: Diagnosis present

## 2017-01-24 DIAGNOSIS — H547 Unspecified visual loss: Secondary | ICD-10-CM | POA: Diagnosis present

## 2017-01-24 DIAGNOSIS — Z79891 Long term (current) use of opiate analgesic: Secondary | ICD-10-CM

## 2017-01-24 DIAGNOSIS — Z79899 Other long term (current) drug therapy: Secondary | ICD-10-CM

## 2017-01-24 DIAGNOSIS — M1712 Unilateral primary osteoarthritis, left knee: Principal | ICD-10-CM | POA: Diagnosis present

## 2017-01-24 DIAGNOSIS — Z96659 Presence of unspecified artificial knee joint: Secondary | ICD-10-CM

## 2017-01-24 DIAGNOSIS — M25462 Effusion, left knee: Secondary | ICD-10-CM | POA: Diagnosis present

## 2017-01-24 DIAGNOSIS — N400 Enlarged prostate without lower urinary tract symptoms: Secondary | ICD-10-CM | POA: Diagnosis present

## 2017-01-24 DIAGNOSIS — Z96652 Presence of left artificial knee joint: Secondary | ICD-10-CM

## 2017-01-24 DIAGNOSIS — E669 Obesity, unspecified: Secondary | ICD-10-CM | POA: Diagnosis present

## 2017-01-24 DIAGNOSIS — Z7901 Long term (current) use of anticoagulants: Secondary | ICD-10-CM

## 2017-01-24 DIAGNOSIS — Z87891 Personal history of nicotine dependence: Secondary | ICD-10-CM

## 2017-01-24 DIAGNOSIS — N189 Chronic kidney disease, unspecified: Secondary | ICD-10-CM | POA: Diagnosis present

## 2017-01-24 DIAGNOSIS — I5022 Chronic systolic (congestive) heart failure: Secondary | ICD-10-CM | POA: Diagnosis present

## 2017-01-24 DIAGNOSIS — J310 Chronic rhinitis: Secondary | ICD-10-CM | POA: Diagnosis present

## 2017-01-24 HISTORY — PX: TOTAL KNEE ARTHROPLASTY: SHX125

## 2017-01-24 LAB — TYPE AND SCREEN
ABO/RH(D): A NEG
Antibody Screen: NEGATIVE

## 2017-01-24 SURGERY — ARTHROPLASTY, KNEE, TOTAL
Anesthesia: Regional | Site: Knee | Laterality: Left

## 2017-01-24 MED ORDER — FENTANYL CITRATE (PF) 100 MCG/2ML IJ SOLN
INTRAMUSCULAR | Status: AC
  Filled 2017-01-24: qty 2

## 2017-01-24 MED ORDER — BUMETANIDE 1 MG PO TABS
1.0000 mg | ORAL_TABLET | Freq: Two times a day (BID) | ORAL | Status: DC
  Administered 2017-01-24 – 2017-01-28 (×7): 1 mg via ORAL
  Filled 2017-01-24 (×9): qty 1

## 2017-01-24 MED ORDER — DEXAMETHASONE SODIUM PHOSPHATE 10 MG/ML IJ SOLN
10.0000 mg | Freq: Once | INTRAMUSCULAR | Status: AC
  Administered 2017-01-24: 10 mg via INTRAVENOUS

## 2017-01-24 MED ORDER — METHOCARBAMOL 1000 MG/10ML IJ SOLN
500.0000 mg | Freq: Four times a day (QID) | INTRAMUSCULAR | Status: DC | PRN
  Administered 2017-01-24: 500 mg via INTRAVENOUS
  Filled 2017-01-24: qty 550

## 2017-01-24 MED ORDER — FENTANYL CITRATE (PF) 100 MCG/2ML IJ SOLN
25.0000 ug | INTRAMUSCULAR | Status: DC | PRN
  Administered 2017-01-24: 25 ug via INTRAVENOUS
  Administered 2017-01-25: 50 ug via INTRAVENOUS
  Filled 2017-01-24 (×2): qty 2

## 2017-01-24 MED ORDER — FENTANYL CITRATE (PF) 100 MCG/2ML IJ SOLN: 25.0000 ug | INTRAMUSCULAR | Status: DC | PRN

## 2017-01-24 MED ORDER — CHLORHEXIDINE GLUCONATE 4 % EX LIQD: 60.0000 mL | Freq: Once | CUTANEOUS | Status: DC

## 2017-01-24 MED ORDER — LIDOCAINE 2% (20 MG/ML) 5 ML SYRINGE
INTRAMUSCULAR | Status: DC | PRN
  Administered 2017-01-24: 40 mg via INTRAVENOUS

## 2017-01-24 MED ORDER — ACETAMINOPHEN 500 MG PO TABS: 1000.0000 mg | ORAL_TABLET | Freq: Three times a day (TID) | ORAL | 0 refills | Status: AC

## 2017-01-24 MED ORDER — FERROUS SULFATE 325 (65 FE) MG PO TABS: 325.0000 mg | ORAL_TABLET | Freq: Three times a day (TID) | ORAL | 3 refills | Status: AC

## 2017-01-24 MED ORDER — DOCUSATE SODIUM 100 MG PO CAPS
100.0000 mg | ORAL_CAPSULE | Freq: Two times a day (BID) | ORAL | Status: DC
  Administered 2017-01-24 – 2017-01-27 (×7): 100 mg via ORAL
  Filled 2017-01-24 (×7): qty 1

## 2017-01-24 MED ORDER — TRANEXAMIC ACID 1000 MG/10ML IV SOLN
1000.0000 mg | Freq: Once | INTRAVENOUS | Status: AC
  Administered 2017-01-24: 1000 mg via INTRAVENOUS
  Filled 2017-01-24: qty 1100

## 2017-01-24 MED ORDER — DEXAMETHASONE SODIUM PHOSPHATE 10 MG/ML IJ SOLN
10.0000 mg | Freq: Once | INTRAMUSCULAR | Status: AC
  Administered 2017-01-25: 10 mg via INTRAVENOUS
  Filled 2017-01-24: qty 1

## 2017-01-24 MED ORDER — ALBUTEROL SULFATE HFA 108 (90 BASE) MCG/ACT IN AERS: 1.0000 | INHALATION_SPRAY | Freq: Four times a day (QID) | RESPIRATORY_TRACT | Status: DC | PRN

## 2017-01-24 MED ORDER — METHOCARBAMOL 500 MG PO TABS
500.0000 mg | ORAL_TABLET | Freq: Four times a day (QID) | ORAL | Status: DC | PRN
  Administered 2017-01-24 – 2017-01-28 (×8): 500 mg via ORAL
  Filled 2017-01-24 (×8): qty 1

## 2017-01-24 MED ORDER — SODIUM CHLORIDE 0.9 % IV SOLN
INTRAVENOUS | Status: DC
  Administered 2017-01-24 (×2): via INTRAVENOUS

## 2017-01-24 MED ORDER — AMIODARONE HCL 200 MG PO TABS
200.0000 mg | ORAL_TABLET | Freq: Every day | ORAL | Status: DC
  Administered 2017-01-24 – 2017-01-28 (×5): 200 mg via ORAL
  Filled 2017-01-24 (×7): qty 1

## 2017-01-24 MED ORDER — SODIUM CHLORIDE 0.9 % IR SOLN
Status: DC | PRN
  Administered 2017-01-24: 1000 mL

## 2017-01-24 MED ORDER — PROPOFOL 500 MG/50ML IV EMUL
INTRAVENOUS | Status: DC | PRN
  Administered 2017-01-24: 50 ug/kg/min via INTRAVENOUS

## 2017-01-24 MED ORDER — BUPIVACAINE-EPINEPHRINE (PF) 0.25% -1:200000 IJ SOLN
INTRAMUSCULAR | Status: AC
  Filled 2017-01-24: qty 30

## 2017-01-24 MED ORDER — ROPIVACAINE HCL 5 MG/ML IJ SOLN
INTRAMUSCULAR | Status: DC | PRN
  Administered 2017-01-24: 30 mL via PERINEURAL

## 2017-01-24 MED ORDER — BISACODYL 10 MG RE SUPP: 10.0000 mg | Freq: Every day | RECTAL | Status: DC | PRN

## 2017-01-24 MED ORDER — BUPIVACAINE IN DEXTROSE 0.75-8.25 % IT SOLN
INTRATHECAL | Status: DC | PRN
  Administered 2017-01-24: 1.8 mL via INTRATHECAL

## 2017-01-24 MED ORDER — SODIUM CHLORIDE 0.9 % IJ SOLN
INTRAMUSCULAR | Status: AC
  Filled 2017-01-24: qty 50

## 2017-01-24 MED ORDER — MENTHOL 3 MG MT LOZG: 1.0000 | LOZENGE | OROMUCOSAL | Status: DC | PRN

## 2017-01-24 MED ORDER — LACTATED RINGERS IV SOLN
INTRAVENOUS | Status: DC
  Administered 2017-01-24: 1000 mL via INTRAVENOUS
  Administered 2017-01-24: 09:00:00 via INTRAVENOUS

## 2017-01-24 MED ORDER — ACETAMINOPHEN 500 MG PO TABS
1000.0000 mg | ORAL_TABLET | Freq: Three times a day (TID) | ORAL | Status: DC
  Administered 2017-01-24 – 2017-01-28 (×12): 1000 mg via ORAL
  Filled 2017-01-24 (×12): qty 2

## 2017-01-24 MED ORDER — ONDANSETRON HCL 4 MG PO TABS: 4.0000 mg | ORAL_TABLET | Freq: Four times a day (QID) | ORAL | Status: DC | PRN

## 2017-01-24 MED ORDER — METHOCARBAMOL 500 MG PO TABS: 500.0000 mg | ORAL_TABLET | Freq: Four times a day (QID) | ORAL | 0 refills | Status: DC | PRN

## 2017-01-24 MED ORDER — LIDOCAINE 2% (20 MG/ML) 5 ML SYRINGE
INTRAMUSCULAR | Status: AC
  Filled 2017-01-24: qty 5

## 2017-01-24 MED ORDER — MAGNESIUM CITRATE PO SOLN
1.0000 | Freq: Once | ORAL | Status: DC | PRN
Start: 2017-01-24 — End: 2017-01-28

## 2017-01-24 MED ORDER — ONDANSETRON HCL 4 MG/2ML IJ SOLN: 4.0000 mg | Freq: Four times a day (QID) | INTRAMUSCULAR | Status: DC | PRN

## 2017-01-24 MED ORDER — BUPIVACAINE-EPINEPHRINE (PF) 0.25% -1:200000 IJ SOLN
INTRAMUSCULAR | Status: DC | PRN
  Administered 2017-01-24: 30 mL

## 2017-01-24 MED ORDER — GABAPENTIN 400 MG PO CAPS
400.0000 mg | ORAL_CAPSULE | Freq: Three times a day (TID) | ORAL | Status: DC
  Administered 2017-01-24 – 2017-01-28 (×12): 400 mg via ORAL
  Filled 2017-01-24 (×12): qty 1

## 2017-01-24 MED ORDER — TAMSULOSIN HCL 0.4 MG PO CAPS
0.4000 mg | ORAL_CAPSULE | Freq: Every day | ORAL | Status: DC
  Administered 2017-01-24 – 2017-01-28 (×5): 0.4 mg via ORAL
  Filled 2017-01-24 (×5): qty 1

## 2017-01-24 MED ORDER — PROPOFOL 10 MG/ML IV BOLUS
INTRAVENOUS | Status: DC | PRN
  Administered 2017-01-24: 30 mg via INTRAVENOUS

## 2017-01-24 MED ORDER — ROSUVASTATIN CALCIUM 20 MG PO TABS
20.0000 mg | ORAL_TABLET | Freq: Every day | ORAL | Status: DC
  Administered 2017-01-24 – 2017-01-28 (×4): 20 mg via ORAL
  Filled 2017-01-24 (×5): qty 1

## 2017-01-24 MED ORDER — TIOTROPIUM BROMIDE MONOHYDRATE 18 MCG IN CAPS
18.0000 ug | ORAL_CAPSULE | Freq: Every day | RESPIRATORY_TRACT | Status: DC
  Administered 2017-01-25 – 2017-01-28 (×4): 18 ug via RESPIRATORY_TRACT
  Filled 2017-01-24: qty 5

## 2017-01-24 MED ORDER — PROPOFOL 10 MG/ML IV BOLUS
INTRAVENOUS | Status: AC
  Filled 2017-01-24: qty 60

## 2017-01-24 MED ORDER — MIDODRINE HCL 5 MG PO TABS
10.0000 mg | ORAL_TABLET | Freq: Every day | ORAL | Status: DC
  Administered 2017-01-24 – 2017-01-28 (×5): 10 mg via ORAL
  Filled 2017-01-24 (×5): qty 2

## 2017-01-24 MED ORDER — KETOROLAC TROMETHAMINE 30 MG/ML IJ SOLN
INTRAMUSCULAR | Status: AC
  Filled 2017-01-24: qty 1

## 2017-01-24 MED ORDER — ONDANSETRON HCL 4 MG/2ML IJ SOLN: 4.0000 mg | Freq: Once | INTRAMUSCULAR | Status: DC | PRN

## 2017-01-24 MED ORDER — TRAMADOL HCL 50 MG PO TABS: 50.0000 mg | ORAL_TABLET | Freq: Four times a day (QID) | ORAL | 0 refills | Status: DC | PRN

## 2017-01-24 MED ORDER — TRAMADOL HCL 50 MG PO TABS
50.0000 mg | ORAL_TABLET | Freq: Four times a day (QID) | ORAL | Status: DC
  Administered 2017-01-24 (×2): 100 mg via ORAL
  Administered 2017-01-25: 50 mg via ORAL
  Administered 2017-01-25 (×2): 100 mg via ORAL
  Administered 2017-01-25: 50 mg via ORAL
  Administered 2017-01-25 – 2017-01-28 (×11): 100 mg via ORAL
  Filled 2017-01-24: qty 1
  Filled 2017-01-24 (×3): qty 2
  Filled 2017-01-24: qty 1
  Filled 2017-01-24 (×12): qty 2

## 2017-01-24 MED ORDER — TRANEXAMIC ACID 1000 MG/10ML IV SOLN
1000.0000 mg | INTRAVENOUS | Status: AC
  Administered 2017-01-24: 1000 mg via INTRAVENOUS
  Filled 2017-01-24: qty 1100

## 2017-01-24 MED ORDER — KETOROLAC TROMETHAMINE 30 MG/ML IJ SOLN
INTRAMUSCULAR | Status: DC | PRN
  Administered 2017-01-24: 30 mg

## 2017-01-24 MED ORDER — PHENOL 1.4 % MT LIQD: 1.0000 | OROMUCOSAL | Status: DC | PRN

## 2017-01-24 MED ORDER — LORATADINE 10 MG PO TABS
10.0000 mg | ORAL_TABLET | Freq: Every day | ORAL | Status: DC
  Administered 2017-01-26 – 2017-01-28 (×3): 10 mg via ORAL
  Filled 2017-01-24 (×4): qty 1

## 2017-01-24 MED ORDER — ALUM & MAG HYDROXIDE-SIMETH 200-200-20 MG/5ML PO SUSP: 15.0000 mL | ORAL | Status: DC | PRN

## 2017-01-24 MED ORDER — CEFAZOLIN SODIUM-DEXTROSE 2-4 GM/100ML-% IV SOLN
2.0000 g | INTRAVENOUS | Status: AC
Start: 2017-01-24 — End: 2017-01-24
  Administered 2017-01-24: 2 g via INTRAVENOUS

## 2017-01-24 MED ORDER — MIDODRINE HCL 5 MG PO TABS: 10.0000 mg | ORAL_TABLET | Freq: Every day | ORAL | Status: DC

## 2017-01-24 MED ORDER — CEFAZOLIN SODIUM-DEXTROSE 2-4 GM/100ML-% IV SOLN
2.0000 g | Freq: Four times a day (QID) | INTRAVENOUS | Status: AC
  Administered 2017-01-24 (×2): 2 g via INTRAVENOUS
  Filled 2017-01-24 (×2): qty 100

## 2017-01-24 MED ORDER — ONDANSETRON HCL 4 MG/2ML IJ SOLN
INTRAMUSCULAR | Status: AC
  Filled 2017-01-24: qty 2

## 2017-01-24 MED ORDER — POTASSIUM CHLORIDE CRYS ER 20 MEQ PO TBCR
20.0000 meq | EXTENDED_RELEASE_TABLET | Freq: Every day | ORAL | Status: DC
  Administered 2017-01-24 – 2017-01-28 (×5): 20 meq via ORAL
  Filled 2017-01-24 (×5): qty 1

## 2017-01-24 MED ORDER — FENTANYL CITRATE (PF) 100 MCG/2ML IJ SOLN
INTRAMUSCULAR | Status: DC | PRN
  Administered 2017-01-24 (×2): 50 ug via INTRAVENOUS

## 2017-01-24 MED ORDER — SODIUM CHLORIDE 0.9 % IJ SOLN
INTRAMUSCULAR | Status: DC | PRN
  Administered 2017-01-24: 30 mL

## 2017-01-24 MED ORDER — RIVAROXABAN 10 MG PO TABS
10.0000 mg | ORAL_TABLET | ORAL | Status: DC
  Administered 2017-01-25 – 2017-01-28 (×4): 10 mg via ORAL
  Filled 2017-01-24 (×4): qty 1

## 2017-01-24 MED ORDER — CEFAZOLIN SODIUM-DEXTROSE 2-4 GM/100ML-% IV SOLN
INTRAVENOUS | Status: AC
  Filled 2017-01-24: qty 100

## 2017-01-24 MED ORDER — POLYETHYLENE GLYCOL 3350 17 G PO PACK: 17.0000 g | PACK | Freq: Two times a day (BID) | ORAL | 0 refills | Status: AC

## 2017-01-24 MED ORDER — ONDANSETRON HCL 4 MG/2ML IJ SOLN
INTRAMUSCULAR | Status: DC | PRN
  Administered 2017-01-24: 4 mg via INTRAVENOUS

## 2017-01-24 MED ORDER — DEXAMETHASONE SODIUM PHOSPHATE 10 MG/ML IJ SOLN
INTRAMUSCULAR | Status: AC
  Filled 2017-01-24: qty 1

## 2017-01-24 MED ORDER — METOCLOPRAMIDE HCL 5 MG/ML IJ SOLN: 5.0000 mg | Freq: Three times a day (TID) | INTRAMUSCULAR | Status: DC | PRN

## 2017-01-24 MED ORDER — METOCLOPRAMIDE HCL 5 MG PO TABS: 5.0000 mg | ORAL_TABLET | Freq: Three times a day (TID) | ORAL | Status: DC | PRN

## 2017-01-24 MED ORDER — FERROUS SULFATE 325 (65 FE) MG PO TABS
325.0000 mg | ORAL_TABLET | Freq: Three times a day (TID) | ORAL | Status: DC
  Administered 2017-01-24 – 2017-01-28 (×9): 325 mg via ORAL
  Filled 2017-01-24 (×9): qty 1

## 2017-01-24 MED ORDER — POLYETHYLENE GLYCOL 3350 17 G PO PACK
17.0000 g | PACK | Freq: Two times a day (BID) | ORAL | Status: DC
  Administered 2017-01-25 – 2017-01-26 (×4): 17 g via ORAL
  Filled 2017-01-24 (×5): qty 1

## 2017-01-24 MED ORDER — DIPHENHYDRAMINE HCL 25 MG PO CAPS
25.0000 mg | ORAL_CAPSULE | Freq: Four times a day (QID) | ORAL | Status: DC | PRN
  Administered 2017-01-25 – 2017-01-27 (×3): 25 mg via ORAL
  Filled 2017-01-24 (×3): qty 1

## 2017-01-24 MED ORDER — ALBUTEROL SULFATE (2.5 MG/3ML) 0.083% IN NEBU: 2.5000 mg | INHALATION_SOLUTION | Freq: Four times a day (QID) | RESPIRATORY_TRACT | Status: DC | PRN

## 2017-01-24 SURGICAL SUPPLY — 47 items
ADH SKN CLS APL DERMABOND .7 (GAUZE/BANDAGES/DRESSINGS) ×1
BAG DECANTER FOR FLEXI CONT (MISCELLANEOUS) IMPLANT
BAG SPEC THK2 15X12 ZIP CLS (MISCELLANEOUS)
BAG ZIPLOCK 12X15 (MISCELLANEOUS) IMPLANT
BANDAGE ACE 6X5 VEL STRL LF (GAUZE/BANDAGES/DRESSINGS) ×2 IMPLANT
BLADE SAW SGTL 11.0X1.19X90.0M (BLADE) IMPLANT
BLADE SAW SGTL 13.0X1.19X90.0M (BLADE) ×2 IMPLANT
BOWL SMART MIX CTS (DISPOSABLE) ×2 IMPLANT
CAPT KNEE TOTAL 3 ATTUNE ×1 IMPLANT
CEMENT HV SMART SET (Cement) ×2 IMPLANT
COVER SURGICAL LIGHT HANDLE (MISCELLANEOUS) ×2 IMPLANT
CUFF TOURN SGL QUICK 34 (TOURNIQUET CUFF) ×2
CUFF TRNQT CYL 34X4X40X1 (TOURNIQUET CUFF) ×1 IMPLANT
DECANTER SPIKE VIAL GLASS SM (MISCELLANEOUS) ×2 IMPLANT
DERMABOND ADVANCED (GAUZE/BANDAGES/DRESSINGS) ×1
DERMABOND ADVANCED .7 DNX12 (GAUZE/BANDAGES/DRESSINGS) ×1 IMPLANT
DRAPE U-SHAPE 47X51 STRL (DRAPES) ×2 IMPLANT
DRESSING AQUACEL AG SP 3.5X10 (GAUZE/BANDAGES/DRESSINGS) ×1 IMPLANT
DRSG AQUACEL AG SP 3.5X10 (GAUZE/BANDAGES/DRESSINGS) ×2
DURAPREP 26ML APPLICATOR (WOUND CARE) ×4 IMPLANT
ELECT REM PT RETURN 15FT ADLT (MISCELLANEOUS) ×2 IMPLANT
GLOVE BIOGEL M 7.0 STRL (GLOVE) IMPLANT
GLOVE BIOGEL PI IND STRL 7.5 (GLOVE) ×1 IMPLANT
GLOVE BIOGEL PI IND STRL 8.5 (GLOVE) ×1 IMPLANT
GLOVE BIOGEL PI INDICATOR 7.5 (GLOVE) ×6
GLOVE BIOGEL PI INDICATOR 8.5 (GLOVE) ×1
GLOVE ECLIPSE 8.0 STRL XLNG CF (GLOVE) ×2 IMPLANT
GLOVE ORTHO TXT STRL SZ7.5 (GLOVE) ×4 IMPLANT
GOWN STRL REUS W/TWL LRG LVL3 (GOWN DISPOSABLE) ×3 IMPLANT
GOWN STRL REUS W/TWL XL LVL3 (GOWN DISPOSABLE) ×3 IMPLANT
HANDPIECE INTERPULSE COAX TIP (DISPOSABLE) ×2
MANIFOLD NEPTUNE II (INSTRUMENTS) ×2 IMPLANT
PACK TOTAL KNEE CUSTOM (KITS) ×2 IMPLANT
POSITIONER SURGICAL ARM (MISCELLANEOUS) ×2 IMPLANT
SET HNDPC FAN SPRY TIP SCT (DISPOSABLE) ×1 IMPLANT
SET PAD KNEE POSITIONER (MISCELLANEOUS) ×2 IMPLANT
SUT MNCRL AB 4-0 PS2 18 (SUTURE) ×2 IMPLANT
SUT STRATAFIX 0 PDS 27 VIOLET (SUTURE) ×2
SUT VIC AB 1 CT1 36 (SUTURE) ×2 IMPLANT
SUT VIC AB 2-0 CT1 27 (SUTURE) ×4
SUT VIC AB 2-0 CT1 TAPERPNT 27 (SUTURE) ×3 IMPLANT
SUTURE STRATFX 0 PDS 27 VIOLET (SUTURE) ×1 IMPLANT
SYR 50ML LL SCALE MARK (SYRINGE) ×1 IMPLANT
TRAY FOLEY W/METER SILVER 16FR (SET/KITS/TRAYS/PACK) ×2 IMPLANT
WATER STERILE IRR 1500ML POUR (IV SOLUTION) ×3 IMPLANT
WRAP KNEE MAXI GEL POST OP (GAUZE/BANDAGES/DRESSINGS) ×2 IMPLANT
YANKAUER SUCT BULB TIP 10FT TU (MISCELLANEOUS) ×2 IMPLANT

## 2017-01-24 NOTE — Anesthesia Postprocedure Evaluation (Signed)
Anesthesia Post Note  Patient: [name]  Procedure(s) Performed: Procedure(s) (LRB): LEFT TOTAL KNEE ARTHROPLASTY (Left)     Patient location during evaluation: PACU Anesthesia Type: Regional and Spinal Level of consciousness: oriented and awake and alert Pain management: pain level controlled Vital Signs Assessment: post-procedure vital signs reviewed and stable Respiratory status: spontaneous breathing, respiratory function stable and patient connected to nasal cannula oxygen Cardiovascular status: blood pressure returned to baseline and stable Postop Assessment: no headache and no backache Anesthetic complications: no    Last Vitals:  Vitals:   01/24/17 1049 01/24/17 1156  BP: (!) 131/59 (!) 152/75  Pulse: 69 76  Resp: 16 16  Temp: 36.6 C 36.4 C  SpO2: 97% 97%    Last Pain:  Vitals:   01/24/17 1156  TempSrc: Oral  PainSc:                  Manali Mcelmurry P Sharine Cadle

## 2017-01-24 NOTE — Discharge Instructions (Addendum)
INSTRUCTIONS AFTER JOINT REPLACEMENT  ° °o Remove items at home which could result in a fall. This includes throw rugs or furniture in walking pathways °o ICE to the affected joint every three hours while awake for 30 minutes at a time, for at least the first 3-5 days, and then as needed for pain and swelling.  Continue to use ice for pain and swelling. You may notice swelling that will progress down to the foot and ankle.  This is normal after surgery.  Elevate your leg when you are not up walking on it.   °o Continue to use the breathing machine you got in the hospital (incentive spirometer) which will help keep your temperature down.  It is common for your temperature to cycle up and down following surgery, especially at night when you are not up moving around and exerting yourself.  The breathing machine keeps your lungs expanded and your temperature down. ° ° °DIET:  As you were doing prior to hospitalization, we recommend a well-balanced diet. ° °DRESSING / WOUND CARE / SHOWERING ° °Keep the surgical dressing until follow up.  The dressing is water proof, so you can shower without any extra covering.  IF THE DRESSING FALLS OFF or the wound gets wet inside, change the dressing with sterile gauze.  Please use good hand washing techniques before changing the dressing.  Do not use any lotions or creams on the incision until instructed by your surgeon.   ° °ACTIVITY ° °o Increase activity slowly as tolerated, but follow the weight bearing instructions below.   °o No driving for 6 weeks or until further direction given by your physician.  You cannot drive while taking narcotics.  °o No lifting or carrying greater than 10 lbs. until further directed by your surgeon. °o Avoid periods of inactivity such as sitting longer than an hour when not asleep. This helps prevent blood clots.  °o You may return to work once you are authorized by your doctor.  ° ° ° °WEIGHT BEARING  ° °Weight bearing as tolerated with assist  device (walker, cane, etc) as directed, use it as long as suggested by your surgeon or therapist, typically at least 4-6 weeks. ° ° °EXERCISES ° °Results after joint replacement surgery are often greatly improved when you follow the exercise, range of motion and muscle strengthening exercises prescribed by your doctor. Safety measures are also important to protect the joint from further injury. Any time any of these exercises cause you to have increased pain or swelling, decrease what you are doing until you are comfortable again and then slowly increase them. If you have problems or questions, call your caregiver or physical therapist for advice.  ° °Rehabilitation is important following a joint replacement. After just a few days of immobilization, the muscles of the leg can become weakened and shrink (atrophy).  These exercises are designed to build up the tone and strength of the thigh and leg muscles and to improve motion. Often times heat used for twenty to thirty minutes before working out will loosen up your tissues and help with improving the range of motion but do not use heat for the first two weeks following surgery (sometimes heat can increase post-operative swelling).  ° °These exercises can be done on a training (exercise) mat, on the floor, on a table or on a bed. Use whatever works the best and is most comfortable for you.    Use music or television while you are exercising so that   the exercises are a pleasant break in your day. This will make your life better with the exercises acting as a break in your routine that you can look forward to.   Perform all exercises about fifteen times, three times per day or as directed.  You should exercise both the operative leg and the other leg as well. ° °Exercises include: °  °• Quad Sets - Tighten up the muscle on the front of the thigh (Quad) and hold for 5-10 seconds.   °• Straight Leg Raises - With your knee straight (if you were given a brace, keep it on),  lift the leg to 60 degrees, hold for 3 seconds, and slowly lower the leg.  Perform this exercise against resistance later as your leg gets stronger.  °• Leg Slides: Lying on your back, slowly slide your foot toward your buttocks, bending your knee up off the floor (only go as far as is comfortable). Then slowly slide your foot back down until your leg is flat on the floor again.  °• Angel Wings: Lying on your back spread your legs to the side as far apart as you can without causing discomfort.  °• Hamstring Strength:  Lying on your back, push your heel against the floor with your leg straight by tightening up the muscles of your buttocks.  Repeat, but this time bend your knee to a comfortable angle, and push your heel against the floor.  You may put a pillow under the heel to make it more comfortable if necessary.  ° °A rehabilitation program following joint replacement surgery can speed recovery and prevent re-injury in the future due to weakened muscles. Contact your doctor or a physical therapist for more information on knee rehabilitation.  ° ° °CONSTIPATION ° °Constipation is defined medically as fewer than three stools per week and severe constipation as less than one stool per week.  Even if you have a regular bowel pattern at home, your normal regimen is likely to be disrupted due to multiple reasons following surgery.  Combination of anesthesia, postoperative narcotics, change in appetite and fluid intake all can affect your bowels.  ° °YOU MUST use at least one of the following options; they are listed in order of increasing strength to get the job done.  They are all available over the counter, and you may need to use some, POSSIBLY even all of these options:   ° °Drink plenty of fluids (prune juice may be helpful) and high fiber foods °Colace 100 mg by mouth twice a day  °Senokot for constipation as directed and as needed Dulcolax (bisacodyl), take with full glass of water  °Miralax (polyethylene glycol)  once or twice a day as needed. ° °If you have tried all these things and are unable to have a bowel movement in the first 3-4 days after surgery call either your surgeon or your primary doctor.   ° °If you experience loose stools or diarrhea, hold the medications until you stool forms back up.  If your symptoms do not get better within 1 week or if they get worse, check with your doctor.  If you experience "the worst abdominal pain ever" or develop nausea or vomiting, please contact the office immediately for further recommendations for treatment. ° ° °ITCHING:  If you experience itching with your medications, try taking only a single pain pill, or even half a pain pill at a time.  You can also use Benadryl over the counter for itching or also to   help with sleep.   TED HOSE STOCKINGS:  Use stockings on both legs until for at least 2 weeks or as directed by physician office. They may be removed at night for sleeping.  MEDICATIONS:  See your medication summary on the After Visit Summary that nursing will review with you.  You may have some home medications which will be placed on hold until you complete the course of blood thinner medication.  It is important for you to complete the blood thinner medication as prescribed.  PRECAUTIONS:  If you experience chest pain or shortness of breath - call 911 immediately for transfer to the hospital emergency department.   If you develop a fever greater that 101 F, purulent drainage from wound, increased redness or drainage from wound, foul odor from the wound/dressing, or calf pain - CONTACT YOUR SURGEON.                                                   FOLLOW-UP APPOINTMENTS:  If you do not already have a post-op appointment, please call the office for an appointment to be seen by your surgeon.  Guidelines for how soon to be seen are listed in your After Visit Summary, but are typically between 1-4 weeks after surgery.  OTHER INSTRUCTIONS:   Knee  Replacement:  Do not place pillow under knee, focus on keeping the knee straight while resting.   MAKE SURE YOU:   Understand these instructions.   Get help right away if you are not doing well or get worse.    Thank you for letting us be a part of your medical care team.  It is a privilege we respect greatly.  We hope these instructions will help you stay on track for a fast and full recovery!     Information on my medicine - XARELTO (Rivaroxaban)  Why was Xarelto prescribed for you? Xarelto was prescribed for you to reduce the risk of a blood clot forming that can cause a stroke if you have a medical condition called atrial fibrillation (a type of irregular heartbeat).  What do you need to know about xarelto ? Take your Xarelto ONCE DAILY at the same time every day with your evening meal. If you have difficulty swallowing the tablet whole, you may crush it and mix in applesauce just prior to taking your dose.  Take Xarelto exactly as prescribed by your doctor and DO NOT stop taking Xarelto without talking to the doctor who prescribed the medication.  Stopping without other stroke prevention medication to take the place of Xarelto may increase your risk of developing a clot that causes a stroke.  Refill your prescription before you run out.  After discharge, you should have regular check-up appointments with your healthcare provider that is prescribing your Xarelto.  In the future your dose may need to be changed if your kidney function or weight changes by a significant amount.  What do you do if you miss a dose? If you are taking Xarelto ONCE DAILY and you miss a dose, take it as soon as you remember on the same day then continue your regularly scheduled once daily regimen the next day. Do not take two doses of Xarelto at the same time or on the same day.   Important Safety Information A possible side effect of Xarelto is bleeding. You  should call your healthcare provider  right away if you experience any of the following: ? Bleeding from an injury or your nose that does not stop. ? Unusual colored urine (red or dark brown) or unusual colored stools (red or black). ? Unusual bruising for unknown reasons. ? A serious fall or if you hit your head (even if there is no bleeding).  Some medicines may interact with Xarelto and might increase your risk of bleeding while on Xarelto. To help avoid this, consult your healthcare provider or pharmacist prior to using any new prescription or non-prescription medications, including herbals, vitamins, non-steroidal anti-inflammatory drugs (NSAIDs) and supplements.  This website has more information on Xarelto: VisitDestination.com.br.

## 2017-01-24 NOTE — Interval H&P Note (Signed)
History and Physical Interval Note:  01/24/2017 7:27 AM  [name]  has presented today for surgery, with the diagnosis of Left knee osteoarthritis  The various methods of treatment have been discussed with the patient and family. After consideration of risks, benefits and other options for treatment, the patient has consented to  Procedure(s) with comments: LEFT TOTAL KNEE ARTHROPLASTY (Left) - 90 mins as a surgical intervention .  The patient's history has been reviewed, patient examined, no change in status, stable for surgery.  I have reviewed the patient's chart and labs.  Questions were answered to the patient's satisfaction.     [name]

## 2017-01-24 NOTE — Evaluation (Signed)
Physical Therapy Evaluation Patient Details Name: [name] MRN: [id number] DOB: 11-Jan-1927 Today's Date: 01/24/2017   History of Present Illness  [name]; [name]  Clinical Impression  Pt is s/p TKA resulting in the deficits listed below (see PT Problem List). Min A for bed to recliner transfer. Good progress expected.  Pt will benefit from skilled PT to increase their independence and safety with mobility to allow discharge to the venue listed below.      Follow Up Recommendations Outpatient PT    Equipment Recommendations  None recommended by PT    Recommendations for Other Services       Precautions / Restrictions Precautions Precautions: Knee Precaution Comments: instructed pt in no pillow under knee; pt denies falls in past 1 year Restrictions Weight Bearing Restrictions: No Other Position/Activity Restrictions: WBAT LLE      Mobility  Bed Mobility Overal bed mobility: Needs Assistance Bed Mobility: Supine to Sit     Supine to sit: Min assist;HOB elevated     General bed mobility comments: min A for LLE  Transfers Overall transfer level: Needs assistance Equipment used: Rolling walker (2 wheeled) Transfers: Sit to/from Stand Sit to Stand: Min assist         General transfer comment: VCs hand placement, min A to rise  Ambulation/Gait Ambulation/Gait assistance: Min guard Ambulation Distance (Feet): 3 Feet Assistive device: Rolling walker (2 wheeled) Gait Pattern/deviations: Step-to pattern;Decreased stride length;Shuffle   Gait velocity interpretation: Below normal speed for age/gender General Gait Details: pt took several pivotal steps from bed to recliner, distance limited by pain/fatigue, VCs sequencing with RW  Stairs            Wheelchair Mobility    Modified Rankin (Stroke Patients Only)       Balance Overall balance assessment: Needs assistance   Sitting balance-Leahy Scale: Good     Standing balance support:  Bilateral upper extremity supported Standing balance-Leahy Scale: Poor                               Pertinent Vitals/Pain Pain Assessment: 0-10 Pain Score: 5  Pain Location: L knee Pain Intervention(s): Limited activity within patient's tolerance;Monitored during session;Premedicated before session;Ice applied    Home Living Family/patient expects to be discharged to:: Private residence Living Arrangements: Children Available Help at Discharge: Family;Available PRN/intermittently Type of Home: House Home Access: Stairs to enter Entrance Stairs-Rails: Left Entrance Stairs-Number of Steps: 4 Home Layout: One level Home Equipment: Walker - 4 wheels;Shower seat;Hand held shower head      Prior Function Level of Independence: Needs assistance   Gait / Transfers Assistance Needed: independent with rollator     Comments: family assists with stairs, walks with rollator     Hand Dominance        Extremity/Trunk Assessment   Upper Extremity Assessment Upper Extremity Assessment: Overall WFL for tasks assessed    Lower Extremity Assessment Lower Extremity Assessment: LLE deficits/detail LLE Deficits / Details: SLR 3/5, knee 5-45* AAROM    Cervical / Trunk Assessment Cervical / Trunk Assessment: Normal  Communication      Cognition Arousal/Alertness: Awake/alert Behavior During Therapy: WFL for tasks assessed/performed Overall Cognitive Status: Within Functional Limits for tasks assessed  General Comments      Exercises Total Joint Exercises Ankle Circles/Pumps: AROM;Both;10 reps;Supine Heel Slides: AAROM;Left;10 reps;Supine Long Arc Quad: AROM;Left;10 reps;Seated   Assessment/Plan    PT Assessment Patient needs continued PT services  PT Problem List Decreased strength;Decreased range of motion;Decreased activity tolerance;Pain;Decreased knowledge of use of DME;Decreased mobility       PT  Treatment Interventions DME instruction;Gait training;Functional mobility training;Stair training;Therapeutic exercise;Patient/family education;Therapeutic activities    PT Goals (Current goals can be found in the Care Plan section)  Acute Rehab PT Goals Patient Stated Goal: to walk with cane, get other knee done PT Goal Formulation: With patient Time For Goal Achievement: 01/31/17 Potential to Achieve Goals: Good    Frequency 7X/week   Barriers to discharge        Co-evaluation               AM-PAC PT "6 Clicks" Daily Activity  Outcome Measure Difficulty turning over in bed (including adjusting bedclothes, sheets and blankets)?: A Little Difficulty moving from lying on back to sitting on the side of the bed? : Unable Difficulty sitting down on and standing up from a chair with arms (e.g., wheelchair, bedside commode, etc,.)?: Unable Help needed moving to and from a bed to chair (including a wheelchair)?: A Little Help needed walking in hospital room?: A Lot Help needed climbing 3-5 steps with a railing? : A Lot 6 Click Score: 12    End of Session Equipment Utilized During Treatment: Gait belt;Oxygen Activity Tolerance: Patient tolerated treatment well Patient left: in chair;with call bell/phone within reach;with chair alarm set Nurse Communication: Mobility status PT Visit Diagnosis: Difficulty in walking, not elsewhere classified (R26.2);Pain;Muscle weakness (generalized) (M62.81) Pain - Right/Left: Right Pain - part of body: Knee    Time: 3085-6943 PT Time Calculation (min) (ACUTE ONLY): 27 min   Charges:   PT Evaluation $PT Eval Low Complexity: 1 Low PT Treatments $Therapeutic Activity: 8-22 mins   PT G Codes:          [name] 01/24/2017, 3:08 PM [phone]

## 2017-01-24 NOTE — Anesthesia Procedure Notes (Signed)
Spinal  Patient location during procedure: OR Start time: 01/24/2017 7:40 AM End time: 01/24/2017 7:50 AM Staffing Anesthesiologist: Karna Christmas P Performed: anesthesiologist  Preanesthetic Checklist Completed: patient identified, surgical consent, pre-op evaluation, timeout performed, IV checked, risks and benefits discussed and monitors and equipment checked Spinal Block Patient position: sitting Prep: DuraPrep Patient monitoring: cardiac monitor, continuous pulse ox and blood pressure Approach: left paramedian Location: L4-5 Injection technique: single-shot Needle Needle type: Pencan  Needle gauge: 24 G Needle length: 9 cm Assessment Sensory level: T10 Additional Notes Functioning IV was confirmed and monitors were applied. Sterile prep and drape, including hand hygiene and sterile gloves were used. The patient was positioned and the spine was prepped. The skin was anesthetized with lidocaine.  Free flow of clear CSF was obtained prior to injecting local anesthetic into the CSF.  The spinal needle aspirated freely following injection.  The needle was carefully withdrawn.  The patient tolerated the procedure well.

## 2017-01-24 NOTE — Anesthesia Preprocedure Evaluation (Addendum)
Anesthesia Evaluation  Patient identified by MRN, date of birth, ID band Patient awake    Reviewed: Allergy & Precautions, H&P , NPO status , Patient's Chart, lab work & pertinent test results, reviewed documented beta blocker date and time   History of Anesthesia Complications Negative for: history of anesthetic complications  Airway Mallampati: II  TM Distance: >3 FB Neck ROM: Full    Dental  (+) Dental Advisory Given, Edentulous Upper, Edentulous Lower   Pulmonary shortness of breath and with exertion, asthma , former smoker,    Pulmonary exam normal        Cardiovascular hypertension, Pt. on medications +CHF  Normal cardiovascular exam+ dysrhythmias Atrial Fibrillation   NSR, rate 67. Bifasicular block  Normal to mild global reduction in LV function (EF 50); grade 1 diastolic dysfunction; severe LAE; calcified aortic valve with mild AS and trace AI; mild MR; trace TR with mildly elevated pulmonary pressure.   The left ventricular ejection fraction is normal (55-65%). Nuclear stress EF: 64%. There was no ST segment deviation noted during stress. The study is normal. This is a low risk study.   Normal resting and stress perfusion. No ischemia or infarction EF 64%   Neuro/Psych negative neurological ROS  negative psych ROS   GI/Hepatic GERD  Medicated and Controlled,  Endo/Other  diabetes  Renal/GU Renal InsufficiencyRenal disease     Musculoskeletal   Abdominal (+) + obese,   Peds  Hematology  (+) anemia ,   Anesthesia Other Findings EF 40% Dyslipidemia  Reproductive/Obstetrics                            Anesthesia Physical  Anesthesia Plan  ASA: III  Anesthesia Plan: Spinal and Regional   Post-op Pain Management:  Regional for Post-op pain   Induction:   PONV Risk Score and Plan: 1 and Ondansetron, Dexamethasone and Propofol infusion  Airway Management Planned: Natural  Airway  Additional Equipment:   Intra-op Plan:   Post-operative Plan:   Informed Consent: I have reviewed the patients History and Physical, chart, labs and discussed the procedure including the risks, benefits and alternatives for the proposed anesthesia with the patient or authorized representative who has indicated his/her understanding and acceptance.   Dental advisory given  Plan Discussed with: CRNA  Anesthesia Plan Comments:         Anesthesia Quick Evaluation

## 2017-01-24 NOTE — Transfer of Care (Signed)
Immediate Anesthesia Transfer of Care Note  Patient: [name]  Procedure(s) Performed: Procedure(s) with comments: LEFT TOTAL KNEE ARTHROPLASTY (Left) - 90 mins  Patient Location: PACU  Anesthesia Type:Spinal  Level of Consciousness:  sedated, patient cooperative and responds to stimulation  Airway & Oxygen Therapy:Patient Spontanous Breathing and Patient connected to face mask oxgen  Post-op Assessment:  Report given to PACU RN and Post -op Vital signs reviewed and stable  Post vital signs:  Reviewed and stable  Last Vitals: There were no vitals filed for this visit.  Complications: No apparent anesthesia complications

## 2017-01-24 NOTE — Op Note (Signed)
NAME:  [name]                      MEDICAL RECORD NO.:  [id number]                             FACILITY:  [facility]      PHYSICIAN:  Madlyn Frankel. Charlann Boxer, M.D.  DATE OF BIRTH:  [birth year]      DATE OF PROCEDURE:  01/24/2017                                     OPERATIVE REPORT         PREOPERATIVE DIAGNOSIS:  Left knee osteoarthritis.      POSTOPERATIVE DIAGNOSIS:  Left knee osteoarthritis.      FINDINGS:  The patient was noted to have complete loss of cartilage and   bone-on-bone arthritis with associated osteophytes in the lateral and patellofemoral compartments of   the knee with a significant synovitis and associated effusion.      PROCEDURE:  Left total knee replacement.      COMPONENTS USED:  DePuy Attune rotating platform posterior stabilized knee   system, a size 6 femur, 6 tibia, size mm PS AOX insert, and 38 anatomic patellar   button.      SURGEON:  Madlyn Frankel. Charlann Boxer, M.D.      ASSISTANT:  Lanney Gins, [name].      ANESTHESIA:  Regional and Spinal.      SPECIMENS:  None.      COMPLICATION:  None.      DRAINS:  None.  EBL: <100cc      TOURNIQUET TIME:   Total Tourniquet Time Documented: Thigh (Left) - 44 minutes Total: Thigh (Left) - 44 minutes  .      The patient was stable to the recovery room.      INDICATION FOR PROCEDURE:  [name] is a 83 y.o. male patient of   mine.  The patient had been seen, evaluated, and treated conservatively in the   office with medication, activity modification, and injections.  The patient had   radiographic changes of bone-on-bone arthritis with endplate sclerosis and osteophytes noted.      The patient failed conservative measures including medication, injections, and activity modification, and at this point was ready for more definitive measures.   Based on the radiographic changes and failed conservative measures, the patient   decided to proceed with total knee replacement.  Risks of infection,   DVT, component  failure, need for revision surgery, postop course, and   expectations were all   discussed and reviewed.  Consent was obtained for benefit of pain   relief.      PROCEDURE IN DETAIL:  The patient was brought to the operative theater.   Once adequate anesthesia, preoperative antibiotics, 2 gm of Ancef, 1 gm of Tranexamic Acid, and 10 mg of Decadron administered, the patient was positioned supine with the left thigh tourniquet placed.  The  left lower extremity was prepped and draped in sterile fashion.  A time-   out was performed identifying the patient, planned procedure, and   extremity.      The left lower extremity was placed in the [facility] leg holder.  The leg was   exsanguinated, tourniquet elevated to 250 mmHg.  A midline incision was  made followed by median parapatellar arthrotomy.  Following initial   exposure, attention was first directed to the patella.  Precut   measurement was noted to be 24 mm.  I resected down to 14 mm and used a   38 anatomic patellar button to restore patellar height as well as cover the cut   surface.      The lug holes were drilled and a metal shim was placed to protect the   patella from retractors and saw blades.      At this point, attention was now directed to the femur.  The femoral   canal was opened with a drill, irrigated to try to prevent fat emboli.  An   intramedullary rod was passed at 5 degrees valgus, 9 mm of bone was   resected off the distal femur.  Following this resection, the tibia was   subluxated anteriorly.  Using the extramedullary guide, 2 mm of bone was resected off   the proximal lateral tibia.  We confirmed the gap would be   stable medially and laterally with a size 6 spacer block as well as confirmed   the cut was perpendicular in the coronal plane, checking with an alignment rod.      Once this was done, I sized the femur to be a size 6 in the anterior-   posterior dimension, chose a standard component based on medial  and   lateral dimension.  The size 6 rotation block was then pinned in   position anterior referenced using the C-clamp to set rotation.  The   anterior, posterior, and  chamfer cuts were made without difficulty nor   notching making certain that I was along the anterior cortex to help   with flexion gap stability.      The final box cut was made off the lateral aspect of distal femur.      At this point, the tibia was sized to be a size 6, the size 6 tray was   then pinned in position through the medial third of the tubercle,   drilled, and keel punched.  Trial reduction was now carried with a 6 femur,  6 tibia, a size 6 then 7 mm PS insert, and the 38 anatomic trial patella botton.  The knee was brought to   extension, full extension with good flexion stability with the patella   tracking through the trochlea without application of pressure.  Given   all these findings the femoral lug holes were drilled and then the trial components removed.  Final components were   opened and cement was mixed.  The knee was irrigated with normal saline   solution and pulse lavage.  The synovial lining was   then injected with 30 cc of 0.25% Marcaine with epinephrine and 1 cc of Toradol plus 30 cc of NS for a total of 61 cc.      The knee was irrigated.  Final implants were then cemented onto clean and   dried cut surfaces of bone with the knee brought to extension with a size 7 mm PS trial insert.      Once the cement had fully cured, the excess cement was removed   throughout the knee.  I confirmed I was satisfied with the range of   motion and stability, and the final size 7 mm PS AOX  insert was chosen.  It was   placed into the knee.      The tourniquet had  been let down at 44 minutes.  No significant   hemostasis required.  The   extensor mechanism was then reapproximated using #1 Vicryl with the knee   in flexion.  The   remaining wound was closed with 2-0 Vicryl and running 4-0 Monocryl.    The knee was cleaned, dried, dressed sterilely using Dermabond and   Aquacel dressing.  The patient was then   brought to recovery room in stable condition, tolerating the procedure   well.   Please note that Physician Assistant, Lanney Gins, [name], was present for the entirety of the case, and was utilized for pre-operative positioning, peri-operative retractor management, general facilitation of the procedure.  He was also utilized for primary wound closure at the end of the case.              Madlyn Frankel Charlann Boxer, M.D.    01/24/2017 9:11 AM

## 2017-01-24 NOTE — Anesthesia Procedure Notes (Signed)
Anesthesia Regional Block: Adductor canal block   Pre-Anesthetic Checklist: ,, timeout performed, Correct Patient, Correct Site, Correct Laterality, Correct Procedure,, site marked, risks and benefits discussed, Surgical consent,  Pre-op evaluation,  At surgeon's request and post-op pain management  Laterality: Left  Prep: chloraprep       Needles:  Injection technique: Single-shot  Needle Type: Echogenic Stimulator Needle     Needle Length: 9cm  Needle Gauge: 21     Additional Needles:   Procedures: ultrasound guided,,,,,,,,  Narrative:  Start time: 01/24/2017 7:10 AM End time: 01/24/2017 7:15 AM Injection made incrementally with aspirations every 5 mL.  Performed by: Personally  Anesthesiologist: Karna Christmas P  Additional Notes: Functioning IV was confirmed and monitors were applied.  A 24mm 21ga Arrow echogenic stimulator needle was used. Sterile prep, hand hygiene and sterile gloves were used.  Negative aspiration and negative test dose prior to incremental administration of local anesthetic. The patient tolerated the procedure well.

## 2017-01-25 DIAGNOSIS — Z79899 Other long term (current) drug therapy: Secondary | ICD-10-CM | POA: Diagnosis not present

## 2017-01-25 DIAGNOSIS — Z87891 Personal history of nicotine dependence: Secondary | ICD-10-CM | POA: Diagnosis not present

## 2017-01-25 DIAGNOSIS — Z96642 Presence of left artificial hip joint: Secondary | ICD-10-CM | POA: Diagnosis present

## 2017-01-25 DIAGNOSIS — M659 Synovitis and tenosynovitis, unspecified: Secondary | ICD-10-CM | POA: Diagnosis not present

## 2017-01-25 DIAGNOSIS — M1712 Unilateral primary osteoarthritis, left knee: Secondary | ICD-10-CM | POA: Diagnosis not present

## 2017-01-25 DIAGNOSIS — J45909 Unspecified asthma, uncomplicated: Secondary | ICD-10-CM | POA: Diagnosis present

## 2017-01-25 DIAGNOSIS — Z79891 Long term (current) use of opiate analgesic: Secondary | ICD-10-CM | POA: Diagnosis not present

## 2017-01-25 DIAGNOSIS — M25462 Effusion, left knee: Secondary | ICD-10-CM | POA: Diagnosis not present

## 2017-01-25 DIAGNOSIS — H3530 Unspecified macular degeneration: Secondary | ICD-10-CM | POA: Diagnosis not present

## 2017-01-25 DIAGNOSIS — E669 Obesity, unspecified: Secondary | ICD-10-CM | POA: Diagnosis not present

## 2017-01-25 DIAGNOSIS — Z6832 Body mass index (BMI) 32.0-32.9, adult: Secondary | ICD-10-CM | POA: Diagnosis not present

## 2017-01-25 DIAGNOSIS — I5022 Chronic systolic (congestive) heart failure: Secondary | ICD-10-CM | POA: Diagnosis not present

## 2017-01-25 DIAGNOSIS — J310 Chronic rhinitis: Secondary | ICD-10-CM | POA: Diagnosis present

## 2017-01-25 DIAGNOSIS — I480 Paroxysmal atrial fibrillation: Secondary | ICD-10-CM | POA: Diagnosis not present

## 2017-01-25 DIAGNOSIS — I130 Hypertensive heart and chronic kidney disease with heart failure and stage 1 through stage 4 chronic kidney disease, or unspecified chronic kidney disease: Secondary | ICD-10-CM | POA: Diagnosis not present

## 2017-01-25 DIAGNOSIS — Z7901 Long term (current) use of anticoagulants: Secondary | ICD-10-CM | POA: Diagnosis not present

## 2017-01-25 DIAGNOSIS — N400 Enlarged prostate without lower urinary tract symptoms: Secondary | ICD-10-CM | POA: Diagnosis not present

## 2017-01-25 DIAGNOSIS — I429 Cardiomyopathy, unspecified: Secondary | ICD-10-CM | POA: Diagnosis not present

## 2017-01-25 DIAGNOSIS — H547 Unspecified visual loss: Secondary | ICD-10-CM | POA: Diagnosis present

## 2017-01-25 DIAGNOSIS — E785 Hyperlipidemia, unspecified: Secondary | ICD-10-CM | POA: Diagnosis not present

## 2017-01-25 DIAGNOSIS — N189 Chronic kidney disease, unspecified: Secondary | ICD-10-CM | POA: Diagnosis not present

## 2017-01-25 LAB — CBC
HEMATOCRIT: 28.2 % — AB (ref 39.0–52.0)
HEMOGLOBIN: 9.5 g/dL — AB (ref 13.0–17.0)
MCH: 32.4 pg (ref 26.0–34.0)
MCHC: 33.7 g/dL (ref 30.0–36.0)
MCV: 96.2 fL (ref 78.0–100.0)
Platelets: 192 10*3/uL (ref 150–400)
RBC: 2.93 MIL/uL — AB (ref 4.22–5.81)
RDW: 14.4 % (ref 11.5–15.5)
WBC: 8.9 10*3/uL (ref 4.0–10.5)

## 2017-01-25 LAB — BASIC METABOLIC PANEL
ANION GAP: 6 (ref 5–15)
BUN: 15 mg/dL (ref 6–20)
CHLORIDE: 102 mmol/L (ref 101–111)
CO2: 30 mmol/L (ref 22–32)
Calcium: 8.1 mg/dL — ABNORMAL LOW (ref 8.9–10.3)
Creatinine, Ser: 0.94 mg/dL (ref 0.61–1.24)
GFR calc Af Amer: >60 mL/min (ref >60)
GLUCOSE: 102 mg/dL — AB (ref 65–99)
POTASSIUM: 3.7 mmol/L (ref 3.5–5.1)
Sodium: 138 mmol/L (ref 135–145)

## 2017-01-25 NOTE — Progress Notes (Signed)
Physical Therapy Treatment Patient Details Name: [name] MRN: [id number] DOB: 06-01-1926 Today's Date: 01/25/2017    History of Present Illness [name]; [name]    PT Comments    POD # 1 pm session Pt progressing slowly with increased c/o pain and "other knee is bad".  Amb to and from bathroom then returned to recliner to perform some TKR TE's followed by ICE.   Follow Up Recommendations  Home health PT  pre arranged OP would be very difficult for pt   Equipment Recommendations  None recommended by PT    Recommendations for Other Services       Precautions / Restrictions Precautions Precautions: Knee Precaution Comments: instructed pt in no pillow under knee; pt denies falls in past 1 year Restrictions Weight Bearing Restrictions: No Other Position/Activity Restrictions: WBAT LLE    Mobility  Bed Mobility               General bed mobility comments: OOB in recliner  Transfers Overall transfer level: Needs assistance Equipment used: Rolling walker (2 wheeled) Transfers: Sit to/from Stand Sit to Stand: Min assist;Mod assist         General transfer comment: 50% VCs hand placement, min A to rise and lower  Ambulation/Gait Ambulation/Gait assistance: Min assist Ambulation Distance (Feet): 20 Feet (10 feet x 2 to and from bathroom) Assistive device: Rolling walker (2 wheeled) Gait Pattern/deviations: Step-to pattern;Decreased stride length;Shuffle Gait velocity: decreased   General Gait Details: very limited amb distance.  short shuffled steps.  Other knee is "bad".    Stairs            Wheelchair Mobility    Modified Rankin (Stroke Patients Only)       Balance Overall balance assessment: Needs assistance   Sitting balance-Leahy Scale: Good     Standing balance support: Bilateral upper extremity supported Standing balance-Leahy Scale: Poor                              Cognition Arousal/Alertness:  Awake/alert Behavior During Therapy: WFL for tasks assessed/performed Overall Cognitive Status: Within Functional Limits for tasks assessed                                 General Comments: HOH      Exercises   10 reps AP  10 reps knee presses   General Comments        Pertinent Vitals/Pain Pain Assessment: Faces Pain Score: 6  Faces Pain Scale: Hurts little more Pain Location: L knee Pain Descriptors / Indicators: Discomfort;Sore;Operative site guarding Pain Intervention(s): Monitored during session;Premedicated before session;Repositioned;Ice applied    Home Living Family/patient expects to be discharged to:: Private residence Living Arrangements: Children Available Help at Discharge: Family;Available PRN/intermittently Type of Home: House Home Access: Stairs to enter Entrance Stairs-Rails: Left Home Layout: One level Home Equipment: Walker - 4 wheels;Shower seat;Hand held shower head      Prior Function Level of Independence: Needs assistance  Gait / Transfers Assistance Needed: independent with rollator   Comments: family assists with stairs, walks with rollator   PT Goals (current goals can now be found in the care plan section) Acute Rehab PT Goals Patient Stated Goal: to walk with cane, get other knee done Progress towards PT goals: Progressing toward goals    Frequency    7X/week  PT Plan Current plan remains appropriate    Co-evaluation              AM-PAC PT "6 Clicks" Daily Activity  Outcome Measure  Difficulty turning over in bed (including adjusting bedclothes, sheets and blankets)?: A Little Difficulty moving from lying on back to sitting on the side of the bed? : Unable Difficulty sitting down on and standing up from a chair with arms (e.g., wheelchair, bedside commode, etc,.)?: Unable Help needed moving to and from a bed to chair (including a wheelchair)?: A Lot Help needed walking in hospital room?: A Lot Help  needed climbing 3-5 steps with a railing? : A Lot 6 Click Score: 11    End of Session Equipment Utilized During Treatment: Gait belt Activity Tolerance: No increased pain;Patient limited by fatigue Patient left: in chair;with call bell/phone within reach;with chair alarm set Nurse Communication: Mobility status PT Visit Diagnosis: Difficulty in walking, not elsewhere classified (R26.2);Pain;Muscle weakness (generalized) (M62.81) Pain - Right/Left: Right Pain - part of body: Knee     Time: 7989-2119 PT Time Calculation (min) (ACUTE ONLY): 28 min  Charges:  $Gait Training: 8-22 mins $Therapeutic Exercise: 8-22 mins                    G Codes:       Felecia Shelling  PTA [facility]  Acute  Rehab Pager      [phone]

## 2017-01-25 NOTE — Care Management Obs Status (Signed)
MEDICARE OBSERVATION STATUS NOTIFICATION   Patient Details  Name: [name] MRN: [id number] Date of Birth: 02/02/1927   Medicare Observation Status Notification Given:  Yes    Alexis Goodell, RN 01/25/2017, 9:45 AM

## 2017-01-25 NOTE — Progress Notes (Signed)
Patient ID: [name], male   DOB: 06-Jun-1926, 83 y.o.   MRN: [id number] Subjective: 1 Day Post-Op Procedure(s) (LRB): LEFT TOTAL KNEE ARTHROPLASTY (Left)    Patient reports pain as moderate to severe.  No post-operative events  Objective:   VITALS:   Vitals:   01/25/17 0544 01/25/17 0747  BP: (!) 108/45   Pulse: 60   Resp: 16   Temp: 98.5 F (36.9 C)   SpO2: 100% 95%    Neurovascular intact Incision: dressing C/D/I  LABS  Recent Labs  01/25/17 0454  HGB 9.5*  HCT 28.2*  WBC 8.9  PLT 192     Recent Labs  01/25/17 0454  NA 138  K 3.7  BUN 15  CREATININE 0.94  GLUCOSE 102*    No results for input(s): LABPT, INR in the last 72 hours.   Assessment/Plan: 1 Day Post-Op Procedure(s) (LRB): LEFT TOTAL KNEE ARTHROPLASTY (Left)  Past Medical History:  Diagnosis Date  . A-fib (HCC)   . Arthritis    knees  . Asthma   . BPH (benign prostatic hypertrophy)   . Bronchospasm, exercise-induced   . CHF (congestive heart failure) (HCC)    systolic, EF 40% (07/07/2012)  . Chronic kidney disease    stabilized, due to infection  . Dyslipidemia   . Edema    Bilateral - left lower leg greater than right- wears compression stockings  . GERD (gastroesophageal reflux disease)   . Hypertension   . Macular degeneration 08-06-13   bilateral -sight impaired  . Obesity   . Shortness of breath      Advance diet Up with therapy Plan for discharge tomorrow   Based on pain and and expected home discharge in addition to medical co-morbidities he will stay another night for pain monitoring and therapy, further discharge determination will be made after tomorrows assessment

## 2017-01-25 NOTE — Progress Notes (Signed)
Physical Therapy Treatment Patient Details Name: [name] MRN: [id number] DOB: Jun 19, 1926 Today's Date: 01/25/2017    History of Present Illness [name]; [name]    PT Comments    POD # 1 am session. Pt progressing slowly.  Assisted out of recliner to amb a limited distance.  Pt other knee is "bad".  Returned to recliner then performed some TKR TE's followed by ICE.   Follow Up Recommendations  Home health PT  pre arrangement plan for OP will be very difficult for pt   Equipment Recommendations  None recommended by PT    Recommendations for Other Services       Precautions / Restrictions Precautions Precautions: Knee Precaution Comments: instructed pt in no pillow under knee; pt denies falls in past 1 year Restrictions Weight Bearing Restrictions: No Other Position/Activity Restrictions: WBAT LLE    Mobility  Bed Mobility               General bed mobility comments: OOB in recliner  Transfers Overall transfer level: Needs assistance Equipment used: Rolling walker (2 wheeled) Transfers: Sit to/from Stand Sit to Stand: Min assist;Mod assist         General transfer comment: 50% VCs hand placement, min A to rise and lower  Ambulation/Gait Ambulation/Gait assistance: Min assist Ambulation Distance (Feet): 8 Feet Assistive device: Rolling walker (2 wheeled) Gait Pattern/deviations: Step-to pattern;Decreased stride length;Shuffle Gait velocity: decreased   General Gait Details: very limited amb distance.  short shuffled steps.  Other knee is "bad".    Stairs            Wheelchair Mobility    Modified Rankin (Stroke Patients Only)       Balance Overall balance assessment: Needs assistance   Sitting balance-Leahy Scale: Good     Standing balance support: Bilateral upper extremity supported Standing balance-Leahy Scale: Poor                              Cognition Arousal/Alertness: Awake/alert Behavior During  Therapy: WFL for tasks assessed/performed Overall Cognitive Status: Within Functional Limits for tasks assessed                                 General Comments: HOH      Exercises   Total Knee Replacement TE's 10 reps B LE ankle pumps 10 reps towel squeezes 10 reps knee presses 10 reps heel slides  10 reps SAQ's 10 reps SLR's 10 reps ABD Followed by ICE     General Comments        Pertinent Vitals/Pain Pain Assessment: Faces Pain Score: 6  Faces Pain Scale: Hurts little more Pain Location: L knee Pain Descriptors / Indicators: Discomfort;Sore;Operative site guarding Pain Intervention(s): Monitored during session;Premedicated before session;Repositioned;Ice applied    Home Living Family/patient expects to be discharged to:: Private residence Living Arrangements: Children Available Help at Discharge: Family;Available PRN/intermittently Type of Home: House Home Access: Stairs to enter Entrance Stairs-Rails: Left Home Layout: One level Home Equipment: Walker - 4 wheels;Shower seat;Hand held shower head      Prior Function Level of Independence: Needs assistance  Gait / Transfers Assistance Needed: independent with rollator   Comments: family assists with stairs, walks with rollator   PT Goals (current goals can now be found in the care plan section) Acute Rehab PT Goals Patient Stated Goal: to walk with  cane, get other knee done Progress towards PT goals: Progressing toward goals    Frequency    7X/week      PT Plan Current plan remains appropriate    Co-evaluation              AM-PAC PT "6 Clicks" Daily Activity  Outcome Measure  Difficulty turning over in bed (including adjusting bedclothes, sheets and blankets)?: A Little Difficulty moving from lying on back to sitting on the side of the bed? : Unable Difficulty sitting down on and standing up from a chair with arms (e.g., wheelchair, bedside commode, etc,.)?: Unable Help needed  moving to and from a bed to chair (including a wheelchair)?: A Lot Help needed walking in hospital room?: A Lot Help needed climbing 3-5 steps with a railing? : A Lot 6 Click Score: 11    End of Session Equipment Utilized During Treatment: Gait belt Activity Tolerance: No increased pain;Patient limited by fatigue Patient left: in chair;with call bell/phone within reach;with chair alarm set Nurse Communication: Mobility status PT Visit Diagnosis: Difficulty in walking, not elsewhere classified (R26.2);Pain;Muscle weakness (generalized) (M62.81) Pain - Right/Left: Right Pain - part of body: Knee     Time: 7564-3329 PT Time Calculation (min) (ACUTE ONLY): 26 min  Charges:  $Gait Training: 8-22 mins $Therapeutic Activity: 8-22 mins                    G Codes:       Felecia Shelling  PTA [facility]  Acute  Rehab Pager      [phone]

## 2017-01-25 NOTE — Evaluation (Signed)
Occupational Therapy Evaluation Patient Details Name: [name] MRN: [id number] DOB: 02-03-1927 Today's Date: 01/25/2017    History of Present Illness [name]; [name]   Clinical Impression   Pt is s/p TKA resulting in the deficits listed below (see OT Problem List).  Pt will benefit from skilled OT to increase their safety and independence with ADL and functional mobility for ADL to facilitate discharge to venue listed below.        Follow Up Recommendations  No OT follow up    Equipment Recommendations  None recommended by OT       Precautions / Restrictions Precautions Precautions: Knee Precaution Comments: instructed pt in no pillow under knee; pt denies falls in past 1 year Restrictions Weight Bearing Restrictions: No Other Position/Activity Restrictions: WBAT LLE      Mobility Bed Mobility               General bed mobility comments: pt inchair  Transfers Overall transfer level: Needs assistance Equipment used: Rolling walker (2 wheeled) Transfers: Sit to/from Stand Sit to Stand: Min assist;Mod assist              Balance Overall balance assessment: Needs assistance   Sitting balance-Leahy Scale: Good     Standing balance support: Bilateral upper extremity supported Standing balance-Leahy Scale: Poor                             ADL either performed or assessed with clinical judgement   ADL Overall ADL's : Needs assistance/impaired Eating/Feeding: Set up;Sitting   Grooming: Set up;Sitting   Upper Body Bathing: Set up;Sitting   Lower Body Bathing: Moderate assistance;Sit to/from stand;Cueing for safety;Cueing for sequencing   Upper Body Dressing : Set up;Sitting   Lower Body Dressing: Sit to/from stand;Cueing for safety;Cueing for sequencing;Moderate assistance   Toilet Transfer: Moderate assistance;RW Toilet Transfer Details (indicate cue type and reason): sit to stand Toileting- Clothing Manipulation and Hygiene:  Moderate assistance;Sit to/from stand;Cueing for safety;Cueing for sequencing                            Pertinent Vitals/Pain Pain Score: 6  Pain Location: L knee Pain Descriptors / Indicators: Discomfort;Sore Pain Intervention(s): Monitored during session;Limited activity within patient's tolerance;Ice applied;Patient requesting pain meds-RN notified     Hand Dominance     Extremity/Trunk Assessment     Lower Extremity Assessment LLE Deficits / Details: SLR 3/5, knee 5-45* AAROM   Cervical / Trunk Assessment Cervical / Trunk Assessment: Normal   Communication     Cognition Arousal/Alertness: Awake/alert Behavior During Therapy: WFL for tasks assessed/performed Overall Cognitive Status: Within Functional Limits for tasks assessed                                                Home Living Family/patient expects to be discharged to:: Private residence Living Arrangements: Children Available Help at Discharge: Family;Available PRN/intermittently Type of Home: House Home Access: Stairs to enter [employer] of Steps: 4 Entrance Stairs-Rails: Left Home Layout: One level     Bathroom Shower/Tub: Walk-in shower         Home Equipment: Environmental consultant - 4 wheels;Shower seat;Hand held shower head          Prior Functioning/Environment Level  of Independence: Needs assistance  Gait / Transfers Assistance Needed: independent with rollator     Comments: family assists with stairs, walks with rollator        OT Problem List: Decreased strength;Decreased activity tolerance;Decreased safety awareness      OT Treatment/Interventions: Self-care/ADL training;Energy conservation;Patient/family education    OT Goals(Current goals can be found in the care plan section) Acute Rehab OT Goals Patient Stated Goal: to walk with cane, get other knee done OT Goal Formulation: With patient Time For Goal Achievement: 02/08/17 Potential to Achieve  Goals: Good  OT Frequency: Min 2X/week   Barriers to D/C:               AM-PAC PT "6 Clicks" Daily Activity     Outcome Measure Help from another person eating meals?: None Help from another person taking care of personal grooming?: None Help from another person toileting, which includes using toliet, bedpan, or urinal?: A Little Help from another person bathing (including washing, rinsing, drying)?: A Little Help from another person to put on and taking off regular upper body clothing?: None Help from another person to put on and taking off regular lower body clothing?: A Little 6 Click Score: 21   End of Session Equipment Utilized During Treatment: Rolling walker Nurse Communication: Mobility status  Activity Tolerance: Patient limited by pain Patient left: in chair  OT Visit Diagnosis: Unsteadiness on feet (R26.81);Muscle weakness (generalized) (M62.81)                Time: 0272-5366 OT Time Calculation (min): 13 min Charges:  OT General Charges $OT Visit: 1 Visit OT Evaluation $OT Eval Low Complexity: 1 Low G-Codes:     Lise Auer, OT [phone]  Alba Cory 01/25/2017, 12:50 PM

## 2017-01-25 NOTE — Progress Notes (Signed)
Discharge planning, no HH needs identified. Plan for OP PT, has DME. [phone] 

## 2017-01-25 NOTE — Progress Notes (Signed)
New specialty low bed available in pt room, currently in low position. Pt currently sitting in chair comfortably with the chair alarm on.

## 2017-01-26 LAB — BASIC METABOLIC PANEL WITH GFR
Anion gap: 9 (ref 5–15)
BUN: 17 mg/dL (ref 6–20)
CO2: 28 mmol/L (ref 22–32)
Calcium: 8.5 mg/dL — ABNORMAL LOW (ref 8.9–10.3)
Chloride: 99 mmol/L — ABNORMAL LOW (ref 101–111)
Creatinine, Ser: 0.94 mg/dL (ref 0.61–1.24)
GFR calc Af Amer: >60 mL/min
GFR calc non Af Amer: >60 mL/min
Glucose, Bld: 101 mg/dL — ABNORMAL HIGH (ref 65–99)
Potassium: 3.5 mmol/L (ref 3.5–5.1)
Sodium: 136 mmol/L (ref 135–145)

## 2017-01-26 LAB — CBC
HEMATOCRIT: 31.4 % — AB (ref 39.0–52.0)
Hemoglobin: 10.4 g/dL — ABNORMAL LOW (ref 13.0–17.0)
MCH: 31.9 pg (ref 26.0–34.0)
MCHC: 33.1 g/dL (ref 30.0–36.0)
MCV: 96.3 fL (ref 78.0–100.0)
Platelets: 203 10*3/uL (ref 150–400)
RBC: 3.26 MIL/uL — ABNORMAL LOW (ref 4.22–5.81)
RDW: 14.7 % (ref 11.5–15.5)
WBC: 10.5 10*3/uL (ref 4.0–10.5)

## 2017-01-26 NOTE — Progress Notes (Signed)
Physical Therapy Treatment Patient Details Name: [name] MRN: [id number] DOB: 1927/01/27 Today's Date: 01/26/2017    History of Present Illness [name]; [name]    PT Comments    POD # 2 am session Pt not progressing as well as expected.  Increased pain and "other" knee buckling.  Very limited amb distance and requiring + 2 assist for safety such that recliner is following.  Will attempt again after more pain meds.  Positioned in recliner and applied ICE.   Follow Up Recommendations  DC plan and follow up therapy as arranged by surgeon    OP PT would be very difficult for pt to attend   Equipment Recommendations  None recommended by PT    Recommendations for Other Services       Precautions / Restrictions Precautions Precautions: Knee Precaution Comments: no pillow under knee Restrictions Weight Bearing Restrictions: No Other Position/Activity Restrictions: WBAT LLE    Mobility  Bed Mobility               General bed mobility comments: OOB slept in recliner.  Sleeps in a lift chair at home.  Transfers Overall transfer level: Needs assistance Equipment used: Rolling walker (2 wheeled) Transfers: Sit to/from Stand Sit to Stand: Max assist         General transfer comment: 50% VCs hand placement, max A to rise and lower  Ambulation/Gait Ambulation/Gait assistance: Mod assist Ambulation Distance (Feet): 3 Feet Assistive device: Rolling walker (2 wheeled) Gait Pattern/deviations: Step-to pattern;Decreased stride length;Shuffle Gait velocity: decreased   General Gait Details: decreased amb distance due to increased c/o pain.  Max c/o upper body weakness and "other" knee buckling.  Recliner closely following behind for safety.   Stairs            Wheelchair Mobility    Modified Rankin (Stroke Patients Only)       Balance                                            Cognition Arousal/Alertness:  Awake/alert Behavior During Therapy: WFL for tasks assessed/performed Overall Cognitive Status: Within Functional Limits for tasks assessed                                 General Comments: HOH      Exercises      General Comments        Pertinent Vitals/Pain Pain Assessment: 0-10 Pain Score: 8  Pain Location: L knee Pain Descriptors / Indicators: Discomfort;Sore;Operative site guarding Pain Intervention(s): Monitored during session;Repositioned;Ice applied    Home Living                      Prior Function            PT Goals (current goals can now be found in the care plan section) Progress towards PT goals: Progressing toward goals    Frequency    7X/week      PT Plan Discharge plan needs to be updated    Co-evaluation              AM-PAC PT "6 Clicks" Daily Activity  Outcome Measure  Difficulty turning over in bed (including adjusting bedclothes, sheets and blankets)?: A Lot Difficulty moving from lying on back to sitting  on the side of the bed? : A Lot Difficulty sitting down on and standing up from a chair with arms (e.g., wheelchair, bedside commode, etc,.)?: A Lot Help needed moving to and from a bed to chair (including a wheelchair)?: A Lot Help needed walking in hospital room?: A Lot Help needed climbing 3-5 steps with a railing? : Total 6 Click Score: 11    End of Session Equipment Utilized During Treatment: Gait belt Activity Tolerance: No increased pain;Patient limited by fatigue Patient left: in chair;with call bell/phone within reach;with chair alarm set Nurse Communication: Mobility status PT Visit Diagnosis: Difficulty in walking, not elsewhere classified (R26.2);Pain;Muscle weakness (generalized) (M62.81) Pain - Right/Left: Right Pain - part of body: Knee     Time: 0034-9179 PT Time Calculation (min) (ACUTE ONLY): 15 min  Charges:  $Gait Training: 8-22 mins                    G Codes:       {Carel Schnee  PTA [facility]  Acute  Rehab Pager      [phone]

## 2017-01-26 NOTE — Progress Notes (Signed)
Physical Therapy Treatment Patient Details Name: [name] MRN: [id number] DOB: 10-29-1926 Today's Date: 01/26/2017    History of Present Illness [name]; [name]    PT Comments    POD # 2 Pt premedicated for session.  Required increased assist to rise from recliner x 2 attempts.  Initial posterior lean and "other" knee buckling with c/o pain.  Pt was only able to advance gait to 5 feet.  Very unsteady.  Short,shuffled steps.  Max c/o upper body fatigue/weakness and near fall as BOTH knees buckled.  Recliner already following for safety was placed behind pt.   Pt not progressing well enough to attend OP PT as pre arranged by surgeon.  Consulted LPT need for SNF.   Follow Up Recommendations  DC plan and follow up therapy as arranged by surgeon   Pt will need ST Rehab at SNF prior to safely returning home     Equipment Recommendations  None recommended by PT    Recommendations for Other Services       Precautions / Restrictions Precautions Precautions: Knee Precaution Comments: no pillow under knee Restrictions Weight Bearing Restrictions: No Other Position/Activity Restrictions: WBAT LLE    Mobility  Bed Mobility               General bed mobility comments: OOB slept in recliner.  Sleeps in a lift chair at home.  Transfers Overall transfer level: Needs assistance Equipment used: Rolling walker (2 wheeled) Transfers: Sit to/from Stand Sit to Stand: Max assist         General transfer comment: 50% VCs hand placement, max A to rise and lower  Ambulation/Gait Ambulation/Gait assistance: Mod assist Ambulation Distance (Feet): 5 Feet Assistive device: Rolling walker (2 wheeled) Gait Pattern/deviations: Step-to pattern;Decreased stride length;Shuffle Gait velocity: decreased   General Gait Details: decreased amb distance due to increased c/o pain.  Max c/o upper body weakness and "other" knee buckling.  Recliner closely following behind for  safety.   Stairs            Wheelchair Mobility    Modified Rankin (Stroke Patients Only)       Balance                                            Cognition Arousal/Alertness: Awake/alert Behavior During Therapy: WFL for tasks assessed/performed Overall Cognitive Status: Within Functional Limits for tasks assessed                                 General Comments: HOH      Exercises      General Comments        Pertinent Vitals/Pain Pain Assessment: 0-10 Pain Score: 8  Pain Location: L knee Pain Descriptors / Indicators: Discomfort;Sore;Operative site guarding Pain Intervention(s): Monitored during session;Repositioned;Ice applied    Home Living                      Prior Function            PT Goals (current goals can now be found in the care plan section) Progress towards PT goals: Progressing toward goals    Frequency    7X/week      PT Plan Discharge plan needs to be updated    Co-evaluation  AM-PAC PT "6 Clicks" Daily Activity  Outcome Measure  Difficulty turning over in bed (including adjusting bedclothes, sheets and blankets)?: A Lot Difficulty moving from lying on back to sitting on the side of the bed? : A Lot Difficulty sitting down on and standing up from a chair with arms (e.g., wheelchair, bedside commode, etc,.)?: A Lot Help needed moving to and from a bed to chair (including a wheelchair)?: A Lot Help needed walking in hospital room?: A Lot Help needed climbing 3-5 steps with a railing? : Total 6 Click Score: 11    End of Session Equipment Utilized During Treatment: Gait belt Activity Tolerance: No increased pain;Patient limited by fatigue Patient left: in chair;with call bell/phone within reach;with chair alarm set Nurse Communication: Mobility status PT Visit Diagnosis: Difficulty in walking, not elsewhere classified (R26.2);Pain;Muscle weakness (generalized)  (M62.81) Pain - Right/Left: Right Pain - part of body: Knee     Time: 4098-1191 PT Time Calculation (min) (ACUTE ONLY): 18 min  Charges:  $Gait Training: 8-22 mins                    G Codes:       Felecia Shelling  PTA [facility]  Acute  Rehab Pager      [phone]

## 2017-01-26 NOTE — Progress Notes (Signed)
     Subjective: 2 Days Post-Op Procedure(s) (LRB): LEFT TOTAL KNEE ARTHROPLASTY (Left)   Patient reports pain as mild, pain controlled. No events throughout the night.  Working slowly with PT. Plan for d/c home when ready.  Plan is to not discharge today due to need for inpatient therapy to meet goal of being discharged home safely with family/caregiver.  Objective:   VITALS:   Vitals:   01/25/17 2200 01/26/17 0727  BP: (!) 106/56 (!) 115/59  Pulse: 70 70  Resp: 18   Temp: 98 F (36.7 C) 98.4 F (36.9 C)  SpO2: 94% 95%    Dorsiflexion/Plantar flexion intact Incision: dressing C/D/I No cellulitis present Compartment soft  LABS  Recent Labs  01/25/17 0454 01/26/17 0551  HGB 9.5* 10.4*  HCT 28.2* 31.4*  WBC 8.9 10.5  PLT 192 203     Recent Labs  01/25/17 0454 01/26/17 0551  NA 138 136  K 3.7 3.5  BUN 15 17  CREATININE 0.94 0.94  GLUCOSE 102* 101*     Assessment/Plan: 2 Days Post-Op Procedure(s) (LRB): LEFT TOTAL KNEE ARTHROPLASTY (Left)  Up with therapy Discharge home when ready    Anastasio Auerbach. Tatem Fesler   PAC  01/26/2017, 9:01 AM

## 2017-01-26 NOTE — Progress Notes (Signed)
Occupational Therapy Treatment Patient Details Name: [name] MRN: [id number] DOB: Oct 07, 1926 Today's Date: 01/26/2017    History of present illness [name]; [name]   OT comments  Pt sitting in chair and able to stand with A but pain limiting  Follow Up Recommendations  No OT follow up    Equipment Recommendations  None recommended by OT    Recommendations for Other Services      Precautions / Restrictions Precautions Precautions: Knee Precaution Comments: instructed pt in no pillow under knee; pt denies falls in past 1 year Restrictions Weight Bearing Restrictions: No Other Position/Activity Restrictions: WBAT LLE       Mobility Bed Mobility               General bed mobility comments: OOB in recliner  Transfers Overall transfer level: Needs assistance Equipment used: Rolling walker (2 wheeled) Transfers: Sit to/from Stand Sit to Stand: Mod assist                  ADL either performed or assessed with clinical judgement   ADL                           Toilet Transfer: RW;Moderate assistance;Cueing for sequencing;Cueing for safety Toilet Transfer Details (indicate cue type and reason): sit to stand Toileting- Clothing Manipulation and Hygiene: Maximal assistance;Sit to/from stand;Cueing for safety;Cueing for sequencing Toileting - Clothing Manipulation Details (indicate cue type and reason): pt not able to let go of walker to clean self - needed A with hygiene and clothing management       General ADL Comments: pt states pain increases with sit to stand. RN aware     Vision Patient Visual Report: No change from baseline     Perception     Praxis      Cognition Arousal/Alertness: Awake/alert Behavior During Therapy: WFL for tasks assessed/performed Overall Cognitive Status: Within Functional Limits for tasks assessed                                 General Comments: HOH                    Pertinent Vitals/ Pain       Pain Score: 6  Pain Location: L knee Pain Descriptors / Indicators: Discomfort;Sore Pain Intervention(s): Limited activity within patient's tolerance;Ice applied;Repositioned         Frequency  Min 2X/week        Progress Toward Goals  OT Goals(current goals can now be found in the care plan section)  Progress towards OT goals: Progressing toward goals     Plan Discharge plan remains appropriate       AM-PAC PT "6 Clicks" Daily Activity     Outcome Measure   Help from another person eating meals?: None Help from another person taking care of personal grooming?: None Help from another person toileting, which includes using toliet, bedpan, or urinal?: A Little Help from another person bathing (including washing, rinsing, drying)?: A Little Help from another person to put on and taking off regular upper body clothing?: None Help from another person to put on and taking off regular lower body clothing?: A Little 6 Click Score: 21    End of Session Equipment Utilized During Treatment: Rolling walker  OT Visit Diagnosis: Unsteadiness on feet (R26.81);Muscle weakness (generalized) (M62.81)  Activity Tolerance Patient limited by pain   Patient Left in chair   Nurse Communication Mobility status        Time: 0300-9233 OT Time Calculation (min): 12 min  Charges: OT General Charges $OT Visit: 1 Visit OT Treatments $Self Care/Home Management : 8-22 mins  [name], [place] [phone]   [name] 01/26/2017, 11:11 AM

## 2017-01-27 MED ORDER — SODIUM CHLORIDE 0.9 % IV BOLUS (SEPSIS)
250.0000 mL | Freq: Once | INTRAVENOUS | Status: AC
  Administered 2017-01-27: 250 mL via INTRAVENOUS

## 2017-01-27 NOTE — Progress Notes (Addendum)
Physical Therapy Treatment Patient Details Name: [name] MRN: [id number] DOB: [birth year] Today's Date: 01/27/2017    History of Present Illness [name]; [name]    PT Comments    Continues to  Transfer with  Assist of 2 persons. KI on the left has been beneficial  For safety due to right knee being not very strong and with DJD. Plans SNF .  Follow Up Recommendations  SNF     Equipment Recommendations  None recommended by PT    Recommendations for Other Services       Precautions / Restrictions Precautions Precautions: Knee;Fall Precaution Comments: no pillow under knee, right knee is terrible Required Braces or Orthoses: Knee Immobilizer - Left for transfers/.   Mobility  Bed Mobility Overal bed mobility: Needs Assistance Bed Mobility: Sit to Supine       Sit to supine: Max assist   General bed mobility comments: assist with legs onto bed, assist trunk  Transfers Overall transfer level: Needs assistance Equipment used: Rolling walker (2 wheeled) Transfers: Sit to/from Stand Sit to Stand: Mod assist;+2 physical assistance;+2 safety/equipment Stand pivot transfers: +2 safety/equipment;+2 physical assistance;Mod assist;Max assist       General transfer comment: asssist to rise from  recliner and BSC with 2 max assist. ,  small pivot steps to bed then to [facility] and back to bed.   Ambulation/Gait                 Stairs            Wheelchair Mobility    Modified Rankin (Stroke Patients Only)       Balance                                            Cognition Arousal/Alertness: Awake/alert Behavior During Therapy: WFL for tasks assessed/performed Overall Cognitive Status: Within Functional Limits for tasks assessed                                        Exercises    General Comments        Pertinent Vitals/Pain Pain Assessment: Faces Faces Pain Scale: Hurts even more Pain Location: L  knee Pain Descriptors / Indicators: Discomfort;Sore;Operative site guarding Pain Intervention(s): Monitored during session;Premedicated before session    Home Living                      Prior Function            PT Goals (current goals can now be found in the care plan section) Progress towards PT goals: Progressing toward goals    Frequency    7X/week      PT Plan Current plan remains appropriate    Co-evaluation PT/OT/SLP Co-Evaluation/Treatment: Yes Reason for Co-Treatment: Complexity of the patient's impairments (multi-system involvement);For patient/therapist safety PT goals addressed during session: Mobility/safety with mobility OT goals addressed during session: ADL's and self-care      AM-PAC PT "6 Clicks" Daily Activity  Outcome Measure  Difficulty turning over in bed (including adjusting bedclothes, sheets and blankets)?: Unable Difficulty moving from lying on back to sitting on the side of the bed? : Unable Difficulty sitting down on and standing up from a chair with arms (e.g., wheelchair, bedside commode,  etc,.)?: Unable Help needed moving to and from a bed to chair (including a wheelchair)?: Total Help needed walking in hospital room?: Total Help needed climbing 3-5 steps with a railing? : Total 6 Click Score: 6    End of Session Equipment Utilized During Treatment: Gait belt Activity Tolerance: Patient tolerated treatment well Patient left: in bed;with call bell/phone within reach;with bed alarm set;with family/visitor present Nurse Communication: Mobility status PT Visit Diagnosis: Difficulty in walking, not elsewhere classified (R26.2);Pain;Muscle weakness (generalized) (M62.81) Pain - Right/Left: Right Pain - part of body: Knee     Time: 1505-1530 PT Time Calculation (min) (ACUTE ONLY): 25 min  Charges:  $Therapeutic Exercise: 8-22 mins $Therapeutic Activity: 23-37 mins                    G CodesBlanchard Kelch  PT [phone]    [name] 01/27/2017, 4:01 PM

## 2017-01-27 NOTE — Clinical Social Work Note (Signed)
Clinical Social Work Assessment  Patient Details  Name: [name] MRN: [id number] Date of Birth: 01/04/1927  Date of referral:  01/27/17               Reason for consult:  Discharge Planning                Permission sought to share information with:  Chartered certified accountant granted to share information::  Yes, Verbal Permission Granted  Name::        Agency::     Relationship::     Contact Information:     Housing/Transportation Living arrangements for the past 2 months:  Single Family Home Source of Information:  Adult Children Patient Interpreter Needed:  None Criminal Activity/Legal Involvement Pertinent to Current Situation/Hospitalization:  No - Comment as needed Significant Relationships:  Adult Children Lives with:  Adult Children Do you feel safe going back to the place where you live?   (SNF recommended.) Need for family participation in patient care:  Yes (Comment)  Care giving concerns:  Pt requires more care than available at home following hospital dc.   Social Worker assessment / plan:  Pt hospitalized on 01/24/17 from home for pre planned left total arthroplasty. PT has recommended SNF at dc. CSW met with pt / family to review PT recommendations. Pt / family are in agreement with plan for SNF at dc. SNF search initiated and bed offers provided. Pt / family have chosen [facility] for placement. SNF contacted and will have a SNF bed available tomorrow if pt is stable for dc. Pt has [employer] which requires prior authorization for SNF placement. CSW has submitted clinicals to insurance and a decision is pending. CSW will continue to follow to assist with d/c planning to SNF.  Employment status:  Retired Nurse, adult PT Recommendations:  [place] / Referral to community resources:  [facility]  Patient/Family's Response to care:  Pt / family are in agreement  with plan for [facility].  Patient/Family's Understanding of and Emotional Response to Diagnosis, Current Treatment, and Prognosis: Pt / family are aware of pt's medical status. Pt is motivated to work with therapy. Family appreciates assistance with dc planning needs.  Emotional Assessment Appearance:  Appears stated age Attitude/Demeanor/Rapport:  Other (cooperative) Affect (typically observed):  Appropriate, Calm, Pleasant Orientation:  Oriented to Self, Oriented to Place, Oriented to  Time Alcohol / Substance use:  Not Applicable Psych involvement (Current and /or in the community):  No (Comment)  Discharge Needs  Concerns to be addressed:  Discharge Planning Concerns Readmission within the last 30 days:  No Current discharge risk:  None Barriers to Discharge:  No Barriers Identified   Luretha Rued, [place] 01/27/2017, 4:12 PM

## 2017-01-27 NOTE — Progress Notes (Signed)
Physical Therapy Treatment Patient Details Name: [name] MRN: [id number] DOB: 29-Mar-1927 Today's Date: 01/27/2017    History of Present Illness [name]; [name]    PT Comments    The patient requires extensive assistance of for transfers but puts forth effort. The patients BP 83/40 after transfer to [facility]. RN in to give medication to help BP increase.    Patient with noted reddened sacral/ gluteal fold. RN to inspect and place cream. Encouraged the patient to  Get back into bed and consented. PT will return and assist patient into bed later.    Follow Up Recommendations  DC plan and follow up therapy as arranged by surgeon     Equipment Recommendations  None recommended by PT    Recommendations for Other Services       Precautions / Restrictions Precautions Precautions: Knee;Fall Precaution Comments: no pillow under knee Required Braces or Orthoses: Knee Immobilizer - Left (PT obtained KI due to severity of DJD of left knee) Restrictions Other Position/Activity Restrictions: WBAT LLE    Mobility  Bed Mobility               General bed mobility comments: OOB slept in recliner.  Sleeps in a lift chair at home.  Transfers Overall transfer level: Needs assistance Equipment used: Rolling walker (2 wheeled) Transfers: Sit to/from Stand Sit to Stand: Max assist Stand pivot transfers: Max assist       General transfer comment: asssist to rise from  recliner and BSC with 2 max assist. ,  small pivot steps to [facility] and recliner.. A third person for pericare.   Ambulation/Gait                 Stairs            Wheelchair Mobility    Modified Rankin (Stroke Patients Only)       Balance             Standing balance-Leahy Scale: Poor                              Cognition Arousal/Alertness: Awake/alert Behavior During Therapy: WFL for tasks assessed/performed Overall Cognitive Status: Within Functional Limits for tasks  assessed                                 General Comments: HOH      Exercises    General Comments        Pertinent Vitals/Pain Pain Assessment: Faces Faces Pain Scale: Hurts little more Pain Location: L knee Pain Descriptors / Indicators: Discomfort;Sore;Operative site guarding Pain Intervention(s): Monitored during session;Premedicated before session;Repositioned;Ice applied    Home Living                      Prior Function            PT Goals (current goals can now be found in the care plan section) Progress towards PT goals: Progressing toward goals    Frequency    7X/week      PT Plan Current plan remains appropriate    Co-evaluation PT/OT/SLP Co-Evaluation/Treatment: Yes Reason for Co-Treatment: Complexity of the patient's impairments (multi-system involvement);For patient/therapist safety PT goals addressed during session: Mobility/safety with mobility OT goals addressed during session: ADL's and self-care      AM-PAC PT "6 Clicks" Daily Activity  Outcome Measure  Difficulty turning over in bed (including adjusting bedclothes, sheets and blankets)?: Unable Difficulty moving from lying on back to sitting on the side of the bed? : Unable Difficulty sitting down on and standing up from a chair with arms (e.g., wheelchair, bedside commode, etc,.)?: Unable Help needed moving to and from a bed to chair (including a wheelchair)?: Total Help needed walking in hospital room?: Total Help needed climbing 3-5 steps with a railing? : Total 6 Click Score: 6    End of Session Equipment Utilized During Treatment: Gait belt Activity Tolerance: Patient tolerated treatment well Patient left: in chair;with call bell/phone within reach Nurse Communication: Mobility status PT Visit Diagnosis: Difficulty in walking, not elsewhere classified (R26.2);Pain;Muscle weakness (generalized) (M62.81) Pain - Right/Left: Right Pain - part of body: Knee      Time: 1330-1417 PT Time Calculation (min) (ACUTE ONLY): 47 min  Charges:  $ $Therapeutic Activity: 38-52 mins                    G CodesBlanchard Kelch PT [phone]    [name] 01/27/2017, 2:45 PM

## 2017-01-27 NOTE — Progress Notes (Signed)
Occupational Therapy Treatment Patient Details Name: [name] MRN: [id number] DOB: 1926-12-21 Today's Date: 01/27/2017    History of present illness [name]; [name]   OT comments  Performed 2 SPT.  Pt's BP low--see vitals section of chart.  Pt will need SNF for rehab prior to home:  Needs +2 assistance for SPTs  Follow Up Recommendations  SNF    Equipment Recommendations  3 in 1 bedside commode    Recommendations for Other Services      Precautions / Restrictions Precautions Precautions: Knee;Fall Precaution Comments: no pillow under knee Required Braces or Orthoses: Knee Immobilizer - Left Restrictions Weight Bearing Restrictions: No Other Position/Activity Restrictions: WBAT LLE       Mobility Bed Mobility               General bed mobility comments: OOB slept in recliner.  Sleeps in a lift chair at home.  Transfers   Equipment used: Rolling walker (2 wheeled)   Sit to Stand: Mod assist;+2 physical assistance Stand pivot transfers: Mod assist;+2 physical assistance       General transfer comment: pt slowly able to complete SPT.  Transferred to R side both times. Second transfer from 3:1 commode was better than first one from chair. Cues for UE/LE placement and assist to extend LLE    Balance             Standing balance-Leahy Scale: Poor                             ADL either performed or assessed with clinical judgement   ADL                           Toilet Transfer: Moderate assistance;+2 for physical assistance;BSC;RW   Toileting- Clothing Manipulation and Hygiene: Total assistance;+2 for physical assistance;Sit to/from stand         General ADL Comments: pt with low BP today, especially after transfer to 3:1 commode. See vitals section of chart. RN aware.  R knee krepitous and unable to extend leg fully.       Vision       Perception     Praxis      Cognition Arousal/Alertness:  Awake/alert Behavior During Therapy: WFL for tasks assessed/performed Overall Cognitive Status: Within Functional Limits for tasks assessed                                 General Comments: HOH        Exercises     Shoulder Instructions       General Comments      Pertinent Vitals/ Pain       Pain Assessment: Faces Faces Pain Scale: Hurts little more Pain Location: L knee Pain Descriptors / Indicators: Discomfort;Sore;Operative site guarding Pain Intervention(s): Limited activity within patient's tolerance;Monitored during session;Premedicated before session;Repositioned;Ice applied  Home Living                                          Prior Functioning/Environment              Frequency           Progress Toward Goals  OT Goals(current goals can now be  found in the care plan section)  Progress towards OT goals: Progressing toward goals (slowly)     Plan Discharge plan needs to be updated    Co-evaluation    PT/OT/SLP Co-Evaluation/Treatment: Yes Reason for Co-Treatment: For patient/therapist safety PT goals addressed during session: Mobility/safety with mobility OT goals addressed during session: ADL's and self-care      AM-PAC PT "6 Clicks" Daily Activity     Outcome Measure   Help from another person eating meals?: None Help from another person taking care of personal grooming?: A Little Help from another person toileting, which includes using toliet, bedpan, or urinal?: A Lot Help from another person bathing (including washing, rinsing, drying)?: A Lot Help from another person to put on and taking off regular upper body clothing?: A Little Help from another person to put on and taking off regular lower body clothing?: Total 6 Click Score: 15    End of Session    OT Visit Diagnosis: Pain Pain - Right/Left: Left Pain - part of body: Knee   Activity Tolerance  (decreased BP)   Patient Left in chair;with  call bell/phone within reach;with family/visitor present   Nurse Communication  (BP)        Time: 4098-1191 OT Time Calculation (min): 32 min  Charges: OT General Charges $OT Visit: 1 Visit OT Treatments $Self Care/Home Management : 8-22 mins  Marica Otter, OTR/L [phone] 01/27/2017   [name] 01/27/2017, 11:34 AM

## 2017-01-27 NOTE — Clinical Social Work Placement (Signed)
   CLINICAL SOCIAL WORK PLACEMENT  NOTE  Date:  01/27/2017  Patient Details  Name: [name] MRN: [id number] Date of Birth: 04-Jun-1926  Clinical Social Work is seeking post-discharge placement for this patient at the Skilled  Nursing Facility level of care (*CSW will initial, date and re-position this form in  chart as items are completed):  Yes   Patient/family provided with [facility] Clinical Social Work Department's list of facilities offering this level of care within the geographic area requested by the patient (or if unable, by the patient's family).  Yes   Patient/family informed of their freedom to choose among providers that offer the needed level of care, that participate in Medicare, Medicaid or managed care program needed by the patient, have an available bed and are willing to accept the patient.  Yes   Patient/family informed of [facility]'s ownership interest in [facility] and [facility], as well as of the fact that they are under no obligation to receive care at these facilities.  PASRR submitted to EDS on       PASRR number received on       Existing PASRR number confirmed on 01/27/17     FL2 transmitted to all facilities in geographic area requested by pt/family on 01/27/17     FL2 transmitted to all facilities within larger geographic area on       Patient informed that his/her managed care company has contracts with or will negotiate with certain facilities, including the following:        Yes   Patient/family informed of bed offers received.  Patient chooses bed at [facility] Rehab     Physician recommends and patient chooses bed at      Patient to be transferred to   on  .  Patient to be transferred to facility by       Patient family notified on   of transfer.  Name of family member notified:        PHYSICIAN       Additional Comment:    _______________________________________________ Vennie Homans   [phone] 01/27/2017, 4:35 PM

## 2017-01-27 NOTE — Progress Notes (Signed)
     Subjective: 3 Days Post-Op Procedure(s) (LRB): LEFT TOTAL KNEE ARTHROPLASTY (Left)   Patient reports pain as mild, pain controlled with medication. No events throughout the night. Did not do well with PT yesterday and the suggestion for SNF was made.  After reviewing everything with the patient and his family they feel it would be best for his safety to spend some time at a SNF so that he can eventually be discharged home safely.   Objective:   VITALS:   Vitals:   01/26/17 2049 01/27/17 0530  BP: (!) 102/53 (!) 150/71  Pulse: 83 94  Resp: 16 17  Temp: 98.4 F (36.9 C) 98.6 F (37 C)  SpO2: 97% 99%    Dorsiflexion/Plantar flexion intact Incision: dressing C/D/I No cellulitis present Compartment soft  LABS  Recent Labs  01/25/17 0454 01/26/17 0551  HGB 9.5* 10.4*  HCT 28.2* 31.4*  WBC 8.9 10.5  PLT 192 203     Recent Labs  01/25/17 0454 01/26/17 0551  NA 138 136  K 3.7 3.5  BUN 15 17  CREATININE 0.94 0.94  GLUCOSE 102* 101*     Assessment/Plan: 3 Days Post-Op Procedure(s) (LRB): LEFT TOTAL KNEE ARTHROPLASTY (Left) Up with therapy Discharge to SNF, when ready Follow up in 2 weeks at [facility]. Follow up with OLIN,Jaelle Campanile D in 2 weeks.  Contact information:  [facility] [address], [address] [place] [address] [phone]    Obese (BMI 30-39.9) Estimated body mass index is 32.77 kg/m as calculated from the following:   Height as of this encounter: 5\' 6"  (1.676 m).   Weight as of this encounter: 92.1 kg (203 lb). Patient also counseled that weight may inhibit the healing process Patient counseled that losing weight will help with future health issues        Anastasio Auerbach. Jasten Guyette   PAC  01/27/2017, 8:17 AM

## 2017-01-27 NOTE — Discharge Summary (Signed)
Physician Discharge Summary  Patient ID: [name] MRN: [id number] DOB/AGE: Jun 20, 1926 83 y.o.  Admit date: 01/24/2017 Discharge date:   01/28/2017  Procedures:  Procedure(s) (LRB): LEFT TOTAL KNEE ARTHROPLASTY (Left)  Attending Physician:  Dr. Durene Romans   Admission Diagnoses:    Left knee primary OA / pain  Discharge Diagnoses:  Principal Problem:   S/P left TKA Active Problems:   S/P total knee replacement  Past Medical History:  Diagnosis Date  . A-fib (HCC)   . Arthritis    knees  . Asthma   . BPH (benign prostatic hypertrophy)   . Bronchospasm, exercise-induced   . CHF (congestive heart failure) (HCC)    systolic, EF 40% (07/07/2012)  . Chronic kidney disease    stabilized, due to infection  . Dyslipidemia   . Edema    Bilateral - left lower leg greater than right- wears compression stockings  . GERD (gastroesophageal reflux disease)   . Hypertension   . Macular degeneration 08-06-13   bilateral -sight impaired  . Obesity   . Shortness of breath     HPI:    [name], 83 y.o. male, has a history of pain and functional disability in the left knee due to arthritis and has failed non-surgical conservative treatments for greater than 12 weeks to include NSAID's and/or analgesics, corticosteriod injections, viscosupplementation injections, use of assistive devices and activity modification.  Onset of symptoms was gradual, starting 5-6 years ago with gradually worsening course since that time. The patient noted no past surgery on the left knee(s).  Patient currently rates pain in the left knee(s) at 10 out of 10 with activity. Patient has night pain, worsening of pain with activity and weight bearing, pain that interferes with activities of daily living, pain with passive range of motion, crepitus and joint swelling.  Patient has evidence of periarticular osteophytes and joint space narrowing by imaging studies.  There is no active infection.   Risks, benefits  and expectations were discussed with the patient.  Risks including but not limited to the risk of anesthesia, blood clots, nerve damage, blood vessel damage, failure of the prosthesis, infection and up to and including death.  Patient understand the risks, benefits and expectations and wishes to proceed with surgery.  PCP: [name], MD   Discharged Condition: fair  Hospital Course:  Patient underwent the above stated procedure on 01/24/2017. Patient tolerated the procedure well and brought to the recovery room in good condition and subsequently to the floor.   POD#1 BP: 108/45 ; Pulse: 60 ; Temp: 98.5 F (36.9 C) ; Resp: 16 Patient reports pain as moderate to severe.  No post-operative events. Neurovascular intact and incision: dressing C/D/I.  LABS  Basename    HGB     9.5  HCT     28.2    POD #2 BP: 115/59 ; Pulse: 70 ; Temp: 98.4 F (36.9 C) ; Resp: 18 Patient reports pain as mild, pain controlled. No events throughout the night.  Working slowly with PT. Plan for d/c home when ready.  Plan is to not discharge today due to need for inpatient therapy to meet goal of being discharged home safely with family/caregiver. Dorsiflexion/plantar flexion intact, incision: dressing C/D/I, no cellulitis present and compartment soft.   LABS  Basename    HGB     10.4  HCT     31.4   POD #3  BP: 150/71 ; Pulse: 94 ; Temp: 98.6 F (37 C) ;  Resp: 17 Patient reports pain as mild, pain controlled with medication. No events throughout the night. Did not do well with PT yesterday and the suggestion for SNF was made.  After reviewing everything with the patient and his family they feel it would be best for his safety to spend some time at a SNF so that he can eventually be discharged home safely.  Dorsiflexion/plantar flexion intact, incision: dressing C/D/I, no cellulitis present and compartment soft.   LABS   No new labs  POD #4  BP: 127/55 ; Pulse: 81 ; Temp: 98.2 F (36.8 C) ;  Resp: 15 Patient reports pain as mild, pain controlled. No events throughout the night.  Still progressing slowly with PT.  Looking forward to getting out of the hospital.  Ready to be discharged to skilled nursing facility. Dorsiflexion/plantar flexion intact, incision: dressing C/D/I, no cellulitis present and compartment soft.   LABS   No new labs   Discharge Exam: General appearance: alert, cooperative and no distress Extremities: Homans sign is negative, no sign of DVT, no edema, redness or tenderness in the calves or thighs and no ulcers, gangrene or trophic changes  Disposition:   Skilled Nursing Facility with follow up in 2 weeks    Contact information for follow-up providers    Durene Romans, MD. Schedule an appointment as soon as possible for a visit in 2 week(s).   Specialty:  Orthopedic Surgery Contact information: [address] [place] [address] [phone]            Contact information for after-discharge care    Destination    [facility] Follow up.   Specialty:  Skilled Nursing Facility Contact information: [address] [place] [address] [phone]                  Discharge Instructions    Call MD / Call 911    Complete by:  As directed    If you experience chest pain or shortness of breath, CALL 911 and be transported to the hospital emergency room.  If you develope a fever above 101 F, pus (white drainage) or increased drainage or redness at the wound, or calf pain, call your surgeon's office.   Change dressing    Complete by:  As directed    Maintain surgical dressing until follow up in the clinic. If the edges start to pull up, may reinforce with tape. If the dressing is no longer working, may remove and cover with gauze and tape, but must keep the area dry and clean.  Call with any questions or concerns.   Constipation Prevention    Complete by:  As directed    Drink plenty of  fluids.  Prune juice may be helpful.  You may use a stool softener, such as Colace (over the counter) 100 mg twice a day.  Use MiraLax (over the counter) for constipation as needed.   Diet - low sodium heart healthy    Complete by:  As directed    Discharge instructions    Complete by:  As directed    Maintain surgical dressing until follow up in the clinic. If the edges start to pull up, may reinforce with tape. If the dressing is no longer working, may remove and cover with gauze and tape, but must keep the area dry and clean.  Follow up in 2 weeks at [facility]. Call with any questions or concerns.   Increase activity  slowly as tolerated    Complete by:  As directed    Weight bearing as tolerated with assist device (walker, cane, etc) as directed, use it as long as suggested by your surgeon or therapist, typically at least 4-6 weeks.   TED hose    Complete by:  As directed    Use stockings (TED hose) for 2 weeks on both leg(s).  You may remove them at night for sleeping.      Allergies as of 01/28/2017      Reactions   Nsaids    Stomach pain   Vicodin [hydrocodone-acetaminophen] Other (See Comments)   Unknown reaction many years ago   Morphine And Related Nausea And Vomiting   Dizziness, light headed. "Opiates cause tightness in chest".      Medication List    STOP taking these medications   senna 8.6 MG Tabs tablet Commonly known as:  SENOKOT     TAKE these medications   acetaminophen 500 MG tablet Commonly known as:  TYLENOL Take 2 tablets (1,000 mg total) by mouth every 8 (eight) hours. What changed:  how much to take  when to take this  reasons to take this  additional instructions   albuterol 108 (90 Base) MCG/ACT inhaler Commonly known as:  PROVENTIL HFA;VENTOLIN HFA Inhale 1-2 puffs into the lungs every 6 (six) hours as needed for wheezing or shortness of breath.   amiodarone 200 MG tablet Commonly known as:  PACERONE TAKE ONE TABLET BY MOUTH  ONCE DAILY What changed:  See the new instructions.   bumetanide 1 MG tablet Commonly known as:  BUMEX Take 1 tablet (1 mg total) by mouth 2 (two) times daily.   CALCIUM 600+D 600-400 MG-UNIT tablet Generic drug:  Calcium Carbonate-Vitamin D Take 1 tablet by mouth 3 (three) times a week. Monday Wednesday and Friday   cetirizine 10 MG tablet Commonly known as:  ZYRTEC Take 10 mg by mouth daily as needed for allergies.   DSS 100 MG Caps Take 100 mg by mouth 2 (two) times daily. What changed:  how much to take  when to take this   ferrous sulfate 325 (65 FE) MG tablet Commonly known as:  FERROUSUL Take 1 tablet (325 mg total) by mouth 3 (three) times daily with meals.   gabapentin 400 MG capsule Commonly known as:  NEURONTIN TAKE 1 CAPSULE BY MOUTH THREE TIMES DAILY   ICAPS AREDS FORMULA PO Take 1 capsule by mouth 2 (two) times daily.   KLOR-CON M20 20 MEQ tablet Generic drug:  potassium chloride SA TAKE ONE TABLET BY MOUTH ONCE DAILY   Magnesium 250 MG Tabs Take 250 mg by mouth daily.   methocarbamol 500 MG tablet Commonly known as:  ROBAXIN Take 1 tablet (500 mg total) by mouth every 6 (six) hours as needed for muscle spasms.   midodrine 10 MG tablet Commonly known as:  PROAMATINE TAKE ONE TABLET BY MOUTH ONCE DAILY AS NEEDED FOR  SYSTOLIC  BLOOD  PRESSURE  (TOP  NUMBER)  UNDER  90 What changed:  See the new instructions.   multivitamin with minerals Tabs tablet Take 1 tablet by mouth daily.   nystatin powder Commonly known as:  MYCOSTATIN/NYSTOP Apply topically 2 (two) times daily as needed. Prn to treat any areas of concern for yeast.   polyethylene glycol packet Commonly known as:  MIRALAX / GLYCOLAX Take 17 g by mouth 2 (two) times daily. What changed:  when to take this  reasons to take this  rivaroxaban 20 MG Tabs tablet Commonly known as:  XARELTO Take 1 tablet (20 mg total) by mouth daily. What changed:  when to take this   rosuvastatin  20 MG tablet Commonly known as:  CRESTOR TAKE ONE TABLET BY MOUTH ONCE A WEEK, FRIDAY   SPIRIVA HANDIHALER 18 MCG inhalation capsule Generic drug:  tiotropium INHALE ONE PUFF BY MOUTH ONCE DAILY   tamsulosin 0.4 MG Caps capsule Commonly known as:  FLOMAX TAKE 1 CAPSULE BY MOUTH ONCE DAILY What changed:  See the new instructions.   traMADol 50 MG tablet Commonly known as:  ULTRAM Take 1-2 tablets (50-100 mg total) by mouth every 6 (six) hours as needed. What changed:  See the new instructions.            Discharge Care Instructions        Start     Ordered   01/28/17 0000  nystatin (MYCOSTATIN/NYSTOP) powder  2 times daily PRN    Question:  Supervising Provider  Answer:  Durene Romans   01/28/17 0758   01/27/17 0000  Call MD / Call 911    Comments:  If you experience chest pain or shortness of breath, CALL 911 and be transported to the hospital emergency room.  If you develope a fever above 101 F, pus (white drainage) or increased drainage or redness at the wound, or calf pain, call your surgeon's office.   01/27/17 0824   01/27/17 0000  Discharge instructions    Comments:  Maintain surgical dressing until follow up in the clinic. If the edges start to pull up, may reinforce with tape. If the dressing is no longer working, may remove and cover with gauze and tape, but must keep the area dry and clean.  Follow up in 2 weeks at [facility]. Call with any questions or concerns.   01/27/17 0824   01/27/17 0000  Diet - low sodium heart healthy     01/27/17 0824   01/27/17 0000  Constipation Prevention    Comments:  Drink plenty of fluids.  Prune juice may be helpful.  You may use a stool softener, such as Colace (over the counter) 100 mg twice a day.  Use MiraLax (over the counter) for constipation as needed.   01/27/17 0824   01/27/17 0000  Increase activity slowly as tolerated    Comments:  Weight bearing as tolerated with assist device (walker, cane, etc) as  directed, use it as long as suggested by your surgeon or therapist, typically at least 4-6 weeks.   01/27/17 0824   01/27/17 0000  TED hose    Comments:  Use stockings (TED hose) for 2 weeks on both leg(s).  You may remove them at night for sleeping.   01/27/17 0824   01/27/17 0000  Change dressing    Comments:  Maintain surgical dressing until follow up in the clinic. If the edges start to pull up, may reinforce with tape. If the dressing is no longer working, may remove and cover with gauze and tape, but must keep the area dry and clean.  Call with any questions or concerns.   01/27/17 0824   01/24/17 0000  ferrous sulfate (FERROUSUL) 325 (65 FE) MG tablet  3 times daily with meals    Question:  Supervising Provider  Answer:  Durene Romans   01/24/17 0750   01/24/17 0000  polyethylene glycol (MIRALAX / GLYCOLAX) packet  2 times daily    Question:  Supervising Provider  Answer:  Charlann Boxer,  Kayon Dozier   01/24/17 0750   01/24/17 0000  methocarbamol (ROBAXIN) 500 MG tablet  Every 6 hours PRN    Question:  Supervising Provider  Answer:  Durene Romans   01/24/17 0750   01/24/17 0000  traMADol (ULTRAM) 50 MG tablet  Every 6 hours PRN    Question:  Supervising Provider  Answer:  Durene Romans   01/24/17 0750   01/24/17 0000  acetaminophen (TYLENOL) 500 MG tablet  Every 8 hours    Question:  Supervising Provider  Answer:  Durene Romans   01/24/17 0750       Signed: Anastasio Auerbach. Jacquel Mccamish   [name]  01/28/2017, 8:00 AM

## 2017-01-27 NOTE — Progress Notes (Signed)
Physical Therapy Treatment Patient Details Name: [name] MRN: [id number] DOB: [birth year] Today's Date: 01/27/2017    History of Present Illness [name]; [name]    PT Comments    Tolerated exercises in recliner.   Follow Up Recommendations  DC plan and follow up therapy as arranged by surgeon     Equipment Recommendations  None recommended by PT    Recommendations for Other Services       Precautions / Restrictions Precautions Precautions: Knee;Fall Precaution Comments: no pillow under knee Required Braces or Orthoses: Knee Immobilizer - Left Restrictions Other Position/Activity Restrictions: WBAT LLE    Mobility    Balance   Cognition Arousal/Alertness: Awake/alert Behavior During Therapy: WFL for tasks assessed/performed Overall Cognitive Status: Within Functional Limits for tasks assessed                                 General Comments: HOH      Exercises Total Joint Exercises Ankle Circles/Pumps: AROM;Both;10 reps Short Arc QuadBarbaraann Boys;Left;10 reps Heel Slides: AAROM;Left;10 reps Hip ABduction/ADduction: AAROM;Left;10 reps Straight Leg Raises: AAROM;Left;10 reps Goniometric ROM: 10-40 left knee    General Comments        Pertinent Vitals/Pain Pain Assessment: Faces Faces Pain Scale: Hurts little more Pain Location: L knee Pain Descriptors / Indicators: Discomfort;Sore;Operative site guarding Pain Intervention(s): Premedicated before session;Monitored during session;Ice applied;Repositioned    Home Living                      Prior Function            PT Goals (current goals can now be found in the care plan section) Progress towards PT goals: Progressing toward goals    Frequency    7X/week      PT Plan Current plan remains appropriate    Co-evaluation PT/OT/SLP Co-Evaluation/Treatment: Yes Reason for Co-Treatment: For patient/therapist safety PT goals addressed during session: Mobility/safety  with mobility OT goals addressed during session: ADL's and self-care      AM-PAC PT "6 Clicks" Daily Activity  Outcome Measure  Difficulty turning over in bed (including adjusting bedclothes, sheets and blankets)?: Unable Difficulty moving from lying on back to sitting on the side of the bed? : Unable Difficulty sitting down on and standing up from a chair with arms (e.g., wheelchair, bedside commode, etc,.)?: Unable Help needed moving to and from a bed to chair (including a wheelchair)?: Total Help needed walking in hospital room?: Total Help needed climbing 3-5 steps with a railing? : Total 6 Click Score: 6    End of Session   Activity Tolerance: Patient tolerated treatment well Patient left: in chair;with call bell/phone within reach Nurse Communication: Mobility status PT Visit Diagnosis: Difficulty in walking, not elsewhere classified (R26.2);Pain;Muscle weakness (generalized) (M62.81) Pain - Right/Left: Right Pain - part of body: Knee     Time: 6440-3474 PT Time Calculation (min) (ACUTE ONLY): 19 min  Charges:  $Therapeutic Exercise: 8-22 mins                    G CodesBlanchard Kelch PT [phone]     [name] 01/27/2017, 1:19 PM

## 2017-01-27 NOTE — Progress Notes (Signed)
Patient bottom red and blanchable. Patient educated about pressure ulcers and the need to change positions often. Patient agreed to not sit in the chair all the time like he does at home. Barrier cream applied and turning schedule established with nurse aid.

## 2017-01-27 NOTE — Progress Notes (Signed)
Physical Therapy Treatment Patient Details Name: [name] MRN: [id number] DOB: 05/25/1926 Today's Date: 01/27/2017    History of Present Illness [name]; [name]    PT Comments    The patient  Requires assistance of 2 for transfers from recliner to [facility] and back, A third person for pericare. The patient has audible crepitus of right kne significantly.  Plans SNF.   Follow Up Recommendations  DC plan and follow up therapy as arranged by surgeon     Equipment Recommendations  None recommended by PT    Recommendations for Other Services       Precautions / Restrictions Precautions Precautions: Knee;Fall Precaution Comments: no pillow under knee Required Braces or Orthoses: Knee Immobilizer - Left (PT obtained KI due to severity of DJD of left knee) Restrictions Other Position/Activity Restrictions: WBAT LLE    Mobility  Bed Mobility               General bed mobility comments: OOB slept in recliner.  Sleeps in a lift chair at home.  Transfers Overall transfer level: Needs assistance Equipment used: Rolling walker (2 wheeled) Transfers: Sit to/from Stand Sit to Stand: Mod assist;+2 physical assistance Stand pivot transfers: Mod assist;+2 physical assistance       General transfer comment: pt slowly able to complete SPT.  Transferred to R side both times. Second transfer from 3:1 commode was better than first one from chair. Cues for UE/LE placement and assist to extend LLE  Ambulation/Gait                 Stairs            Wheelchair Mobility    Modified Rankin (Stroke Patients Only)       Balance             Standing balance-Leahy Scale: Poor                              Cognition Arousal/Alertness: Awake/alert Behavior During Therapy: WFL for tasks assessed/performed Overall Cognitive Status: Within Functional Limits for tasks assessed                                 General Comments: HOH       Exercises Total Joint Exercises Ankle Circles/Pumps: AROM;Both;10 reps Short Arc QuadBarbaraann [name];Left;10 reps Heel Slides: AAROM;Left;10 reps Hip ABduction/ADduction: AAROM;Left;10 reps Straight Leg Raises: AAROM;Left;10 reps Goniometric ROM: 10-40 left knee    General Comments        Pertinent Vitals/Pain Pain Assessment: Faces Faces Pain Scale: Hurts little more Pain Location: L knee Pain Descriptors / Indicators: Discomfort;Sore;Operative site guarding Pain Intervention(s): Monitored during session;Premedicated before session;Repositioned;Ice applied    Home Living                      Prior Function            PT Goals (current goals can now be found in the care plan section) Progress towards PT goals: Progressing toward goals    Frequency    7X/week      PT Plan Current plan remains appropriate    Co-evaluation PT/OT/SLP Co-Evaluation/Treatment: Yes Reason for Co-Treatment: Complexity of the patient's impairments (multi-system involvement);For patient/therapist safety PT goals addressed during session: Mobility/safety with mobility OT goals addressed during session: ADL's and self-care  AM-PAC PT "6 Clicks" Daily Activity  Outcome Measure  Difficulty turning over in bed (including adjusting bedclothes, sheets and blankets)?: Unable Difficulty moving from lying on back to sitting on the side of the bed? : Unable Difficulty sitting down on and standing up from a chair with arms (e.g., wheelchair, bedside commode, etc,.)?: Unable Help needed moving to and from a bed to chair (including a wheelchair)?: Total Help needed walking in hospital room?: Total Help needed climbing 3-5 steps with a railing? : Total 6 Click Score: 6    End of Session Equipment Utilized During Treatment: Gait belt Activity Tolerance: Patient tolerated treatment well Patient left: in chair;with call bell/phone within reach Nurse Communication: Mobility status PT  Visit Diagnosis: Difficulty in walking, not elsewhere classified (R26.2);Pain;Muscle weakness (generalized) (M62.81) Pain - Right/Left: Right Pain - part of body: Knee     Time: 9449-6759 PT Time Calculation (min) (ACUTE ONLY): 32 min  Charges:   $Therapeutic Activity: 8-22 mins                    G CodesBlanchard Kelch PT [phone]   [name] 01/27/2017, 2:38 PM

## 2017-01-27 NOTE — [place] FL2 (Signed)
[place] MEDICAID FL2 LEVEL OF CARE SCREENING TOOL     IDENTIFICATION  Patient Name: [name] Birthdate: Jun 21, 1926 Sex: male Admission Date (Current Location): 01/24/2017  [facility] and [place] Number:  Producer, television/film/video and Address:  [facility],  [address], [place] [address]      Provider Number: [id number]  Attending Physician Name and Address:  Durene Romans, MD  Relative Name and Phone Number:       Current Level of Care: Hospital Recommended Level of Care: Skilled Nursing Facility Prior Approval Number:    Date Approved/Denied:   PASRR Number: [id number]  Discharge Plan: SNF    Current Diagnoses: Patient Active Problem List   Diagnosis Date Noted  . S/P left TKA 01/24/2017  . S/P total knee replacement 01/24/2017  . Impacted cerumen of left ear 11/05/2016  . Preoperative cardiovascular examination 10/15/2016  . Non-ischemic cardiomyopathy (HCC) 10/15/2016  . Chronic systolic congestive heart failure (HCC) 05/28/2016  . Orthostatic hypotension 09/20/2015  . Impaired glucose tolerance 11/25/2014  . Bifascicular block 12/18/2013  . Bilateral edema of lower extremity 10/25/2013  . CHF (congestive heart failure) (HCC) 08/30/2013  . S/P left THA, AA 08/13/2013  . Chronic anticoagulation 12/06/2012  . Near syncope 12/06/2012  . Acute systolic CHF (congestive heart failure), NYHA class 3 -- Unclear etilogy (? Afib related) EF down from 60-70% to 40%. 07/05/2012  . General weakness 07/05/2012  . Paroxysmal Atrial fibrillation - admitted with RVR 01/18/2012  . DJD (degenerative joint disease), lumbar 01/18/2012  . Hx of colonic polyps 11/02/2011  . BENIGN PROSTATIC HYPERTROPHY 05/13/2010  . SEBORRHEA CAPITIS 02/13/2010  . History of elevated glucose 10/01/2009  . CHRONIC RHINITIS 06/02/2009  . Dyslipidemia 11/01/2008  . INSOMNIA, CHRONIC, MILD 01/02/2008  . Essential hypertension 02/06/2007  . ASTHMA 02/06/2007    Orientation  RESPIRATION BLADDER Height & Weight     Self, Time, Situation, Place  Normal Continent Weight: 92.1 kg (203 lb) Height:   (167.6 cm)  BEHAVIORAL SYMPTOMS/MOOD NEUROLOGICAL BOWEL NUTRITION STATUS  Other (Comment) (no behaviors)   Continent Diet  AMBULATORY STATUS COMMUNICATION OF NEEDS Skin   Extensive Assist Verbally Surgical wounds                       Personal Care Assistance Level of Assistance  Bathing, Feeding, Dressing Bathing Assistance: Maximum assistance Feeding assistance: Independent Dressing Assistance: Maximum assistance     Functional Limitations Info  Sight, Hearing, Speech Sight Info: Impaired Hearing Info: Adequate Speech Info: Adequate    SPECIAL CARE FACTORS FREQUENCY  PT (By licensed PT), OT (By licensed OT)     PT Frequency: 5x wk OT Frequency: 5x wk            Contractures Contractures Info: Not present    Additional Factors Info  Code Status, Allergies Code Status Info: FULL CODE Allergies Info:  Nsaids, Vicodin Hydrocodone-acetaminophen, Morphine And Related           Current Medications (01/27/2017):  This is the current hospital active medication list Current Facility-Administered Medications  Medication Dose Route Frequency Provider Last Rate Last Dose  . 0.9 %  sodium chloride infusion   Intravenous Continuous Lanney Gins, [name]   Stopped at 01/25/17 1550  . acetaminophen (TYLENOL) tablet 1,000 mg  1,000 mg Oral Q8H Babish, Molli Hazard, [name]   1,000 mg at 01/27/17 6578  . albuterol (PROVENTIL) (2.5 MG/3ML) 0.083% nebulizer solution 2.5 mg  2.5 mg Nebulization Q6H PRN  Durene Romans, MD      . alum & mag hydroxide-simeth (MAALOX/MYLANTA) 200-200-20 MG/5ML suspension 15 mL  15 mL Oral Q4H PRN Lanney Gins, [name]      . amiodarone (PACERONE) tablet 200 mg  200 mg Oral Daily Lanney Gins, [name]   200 mg at 01/26/17 1044  . bisacodyl (DULCOLAX) suppository 10 mg  10 mg Rectal Daily PRN Babish, Matthew, [name]      . bumetanide  (BUMEX) tablet 1 mg  1 mg Oral BID Babish, Matthew, [name]   1 mg at 01/26/17 1814  . diphenhydrAMINE (BENADRYL) capsule 25 mg  25 mg Oral Q6H PRN Lanney Gins, [name]   25 mg at 01/27/17 0020  . docusate sodium (COLACE) capsule 100 mg  100 mg Oral BID Lanney Gins, [name]   100 mg at 01/27/17 1191  . fentaNYL (SUBLIMAZE) injection 25-50 mcg  25-50 mcg Intravenous Q2H PRN Lanney Gins, [name]   50 mcg at 01/25/17 0345  . ferrous sulfate tablet 325 mg  325 mg Oral TID PC Lanney Gins, [name]   325 mg at 01/26/17 1814  . gabapentin (NEURONTIN) capsule 400 mg  400 mg Oral TID Lanney Gins, [name]   400 mg at 01/27/17 4782  . loratadine (CLARITIN) tablet 10 mg  10 mg Oral Daily Lanney Gins, [name]   10 mg at 01/27/17 0743  . magnesium citrate solution 1 Bottle  1 Bottle Oral Once PRN Babish, Matthew, [name]      . menthol-cetylpyridinium (CEPACOL) lozenge 3 mg  1 lozenge Oral PRN Lanney Gins, [name]       Or  . phenol (CHLORASEPTIC) mouth spray 1 spray  1 spray Mouth/Throat PRN Babish, Matthew, [name]      . methocarbamol (ROBAXIN) tablet 500 mg  500 mg Oral Q6H PRN Lanney Gins, [name]   500 mg at 01/27/17 0743   Or  . methocarbamol (ROBAXIN) 500 mg in dextrose 5 % 50 mL IVPB  500 mg Intravenous Q6H PRN Lanney Gins, [name]   Stopped at 01/24/17 1033  . metoCLOPramide (REGLAN) tablet 5-10 mg  5-10 mg Oral Q8H PRN Lanney Gins, [name]       Or  . metoCLOPramide (REGLAN) injection 5-10 mg  5-10 mg Intravenous Q8H PRN Babish, Matthew, [name]      . midodrine (PROAMATINE) tablet 10 mg  10 mg Oral Q lunch Durene Romans, MD   10 mg at 01/26/17 1223  . ondansetron (ZOFRAN) tablet 4 mg  4 mg Oral Q6H PRN Lanney Gins, [name]       Or  . ondansetron [facility]) injection 4 mg  4 mg Intravenous Q6H PRN Babish, Matthew, [name]      . polyethylene glycol (MIRALAX / GLYCOLAX) packet 17 g  17 g Oral BID Lanney Gins, [name]   17 g at 01/26/17 2135  . potassium chloride SA (K-DUR,KLOR-CON) CR tablet 20 mEq  20  mEq Oral Daily Lanney Gins, [name]   20 mEq at 01/27/17 0743  . rivaroxaban (XARELTO) tablet 10 mg  10 mg Oral Q24H Lanney Gins, [name]   10 mg at 01/27/17 9562  . rosuvastatin (CRESTOR) tablet 20 mg  20 mg Oral Daily Lanney Gins, [name]   20 mg at 01/27/17 0743  . tamsulosin (FLOMAX) capsule 0.4 mg  0.4 mg Oral Daily Lanney Gins, [name]   0.4 mg at 01/27/17 0743  . tiotropium (SPIRIVA) inhalation capsule 18 mcg  18 mcg Inhalation Daily Lanney Gins, [name]   18 mcg at 01/27/17 1308  .  traMADol (ULTRAM) tablet 50-100 mg  50-100 mg Oral Q6H Lanney Gins, [name]   100 mg at 01/27/17 5427     Discharge Medications: Please see discharge summary for a list of discharge medications.  Relevant Imaging Results:  Relevant Lab Results:   Additional Information SS # [id number]  Marlen Mollica, Dickey Gave, LCSW

## 2017-01-28 MED ORDER — NYSTATIN 100000 UNIT/GM EX POWD: Freq: Two times a day (BID) | CUTANEOUS | 0 refills | Status: DC | PRN

## 2017-01-28 MED ORDER — NYSTATIN 100000 UNIT/GM EX POWD
Freq: Two times a day (BID) | CUTANEOUS | Status: DC
  Administered 2017-01-28: 1 via TOPICAL
  Filled 2017-01-28: qty 15

## 2017-01-28 NOTE — Progress Notes (Signed)
     Subjective: 4 Days Post-Op Procedure(s) (LRB): LEFT TOTAL KNEE ARTHROPLASTY (Left)   Patient reports pain as mild, pain controlled. No events throughout the night.  Still progressing slowly with PT.  Looking forward to getting out of the hospital.  Ready to be discharged to skilled nursing facility.  Objective:   VITALS:   Vitals:   01/27/17 2036 01/28/17 0522  BP: (!) 111/51 (!) 127/55  Pulse: 87 81  Resp: 16 15  Temp: 98.8 F (37.1 C) 98.2 F (36.8 C)  SpO2: 98% 94%    Dorsiflexion/Plantar flexion intact Incision: dressing C/D/I No cellulitis present Compartment soft  LABS  Recent Labs  01/26/17 0551  HGB 10.4*  HCT 31.4*  WBC 10.5  PLT 203     Recent Labs  01/26/17 0551  NA 136  K 3.5  BUN 17  CREATININE 0.94  GLUCOSE 101*     Assessment/Plan: 4 Days Post-Op Procedure(s) (LRB): LEFT TOTAL KNEE ARTHROPLASTY (Left) Up with therapy Discharge to SNF  Follow up in 2 weeks at [facility]. Follow up with OLIN,Kahley Leib D in 2 weeks.  Contact information:  [facility] [address] [place] [address] [phone]        Anastasio Auerbach. Dajanique Robley   PAC  01/28/2017, 7:50 AM

## 2017-01-28 NOTE — Clinical Social Work Placement (Signed)
   CLINICAL SOCIAL WORK PLACEMENT  NOTE  Date:  01/28/2017  Patient Details  Name: [name] MRN: [id number] Date of Birth: 03/30/1927  Clinical Social Work is seeking post-discharge placement for this patient at the Skilled  Nursing Facility level of care (*CSW will initial, date and re-position this form in  chart as items are completed):  Yes   Patient/family provided with [facility] Clinical Social Work Department's list of facilities offering this level of care within the geographic area requested by the patient (or if unable, by the patient's family).  Yes   Patient/family informed of their freedom to choose among providers that offer the needed level of care, that participate in Medicare, Medicaid or managed care program needed by the patient, have an available bed and are willing to accept the patient.  Yes   Patient/family informed of [facility]'s ownership interest in [facility] and [facility], as well as of the fact that they are under no obligation to receive care at these facilities.  PASRR submitted to EDS on       PASRR number received on       Existing PASRR number confirmed on 01/27/17     FL2 transmitted to all facilities in geographic area requested by pt/family on 01/27/17     FL2 transmitted to all facilities within larger geographic area on       Patient informed that his/her managed care company has contracts with or will negotiate with certain facilities, including the following:        Yes   Patient/family informed of bed offers received.  Patient chooses bed at [facility]     Physician recommends and patient chooses bed at      Patient to be transferred to [facility] on 01/28/17.  Patient to be transferred to facility by [facility]     Patient family notified on 01/28/17 of transfer.  Name of family member notified:  Daughter     PHYSICIAN     Additional Comment: Pt / family are in agreement with dc  to [facility] today. [facility] transport is needed. Medical necessity form completed. Family is aware out of pocket costs my be associated with [facility] transport. BCBS medicare has provided authorization for SNF placement. DC Summary has been sent to SNF for review. Scripts included in [employer]. # for report provided to nsg.  Cori Razor LCSW [phone]

## 2017-01-28 NOTE — Progress Notes (Signed)
Physical Therapy Treatment Patient Details Name: [name] MRN: [id number] DOB: 11/05/1926 Today's Date: 01/28/2017    History of Present Illness [name]; [name]    PT Comments    POD # 4 am session Assisted OOB to [facility] then back to bed to perform TKR TE's followed by ICE. Pt plans to D/C to SNF today.   Follow Up Recommendations  SNF     Equipment Recommendations  None recommended by PT    Recommendations for Other Services       Precautions / Restrictions Precautions Precautions: Knee;Fall Precaution Comments: no pillow under knee, right knee is terrible Required Braces or Orthoses: Knee Immobilizer - Left Restrictions Weight Bearing Restrictions: No Other Position/Activity Restrictions: WBAT LLE    Mobility  Bed Mobility Overal bed mobility: Needs Assistance Bed Mobility: Supine to Sit;Sit to Supine     Supine to sit: Mod assist Sit to supine: Max assist   General bed mobility comments: assist with legs onto bed, assist trunk  Transfers Overall transfer level: Needs assistance Equipment used: Rolling walker (2 wheeled) Transfers: Sit to/from Stand Sit to Stand: Mod assist;+2 physical assistance;+2 safety/equipment Stand pivot transfers: +2 safety/equipment;+2 physical assistance;Mod assist;Max assist       General transfer comment: assisted from elevated bed to [facility] then back to bed due to fatigue.   Ambulation/Gait             General Gait Details: transfers only due to fatigue   Stairs            Wheelchair Mobility    Modified Rankin (Stroke Patients Only)       Balance                                            Cognition Arousal/Alertness: Awake/alert Behavior During Therapy: WFL for tasks assessed/performed Overall Cognitive Status: Within Functional Limits for tasks assessed                                 General Comments: HOH      Exercises   Total Knee Replacement TE's 10  reps B LE ankle pumps 10 reps towel squeezes 10 reps knee presses 10 reps heel slides  10 reps SAQ's 10 reps SLR's 10 reps ABD Followed by ICE     General Comments        Pertinent Vitals/Pain Pain Assessment: Faces Faces Pain Scale: Hurts little more Pain Location: L knee Pain Descriptors / Indicators: Discomfort;Sore;Operative site guarding Pain Intervention(s): Monitored during session;Repositioned;Ice applied    Home Living                      Prior Function            PT Goals (current goals can now be found in the care plan section) Progress towards PT goals: Progressing toward goals    Frequency    7X/week      PT Plan Current plan remains appropriate    Co-evaluation              AM-PAC PT "6 Clicks" Daily Activity  Outcome Measure  Difficulty turning over in bed (including adjusting bedclothes, sheets and blankets)?: Unable Difficulty moving from lying on back to sitting on the side of the bed? : Unable  Difficulty sitting down on and standing up from a chair with arms (e.g., wheelchair, bedside commode, etc,.)?: Unable Help needed moving to and from a bed to chair (including a wheelchair)?: Total Help needed walking in hospital room?: Total Help needed climbing 3-5 steps with a railing? : Total 6 Click Score: 6    End of Session Equipment Utilized During Treatment: Gait belt Activity Tolerance: Patient tolerated treatment well Patient left: in bed;with call bell/phone within reach;with bed alarm set;with family/visitor present Nurse Communication: Mobility status PT Visit Diagnosis: Difficulty in walking, not elsewhere classified (R26.2);Pain;Muscle weakness (generalized) (M62.81) Pain - Right/Left: Right Pain - part of body: Knee     Time: 4098-1191 PT Time Calculation (min) (ACUTE ONLY): 25 min  Charges:  $Therapeutic Exercise: 8-22 mins $Therapeutic Activity: 8-22 mins                    G Codes:       Felecia Shelling   PTA [facility]  Acute  Rehab Pager      [phone]

## 2017-01-28 NOTE — Progress Notes (Signed)
Pt has SNF bed at [facility] today if stable for dc. BCBS medicare has provided prior approval for placement. CSW will follow to assist with dc planning to SNF.  Cori Razor LCSW [phone]

## 2017-01-28 NOTE — Progress Notes (Signed)
Attempted to call report to [facility] rehab twice, no answer after being transferred to nurse.

## 2017-01-28 NOTE — Progress Notes (Signed)
Repot called To [facility].

## 2017-01-31 ENCOUNTER — Non-Acute Institutional Stay (SKILLED_NURSING_FACILITY): Payer: Medicare Other | Admitting: Internal Medicine

## 2017-01-31 DIAGNOSIS — L98429 Non-pressure chronic ulcer of back with unspecified severity: Secondary | ICD-10-CM | POA: Diagnosis not present

## 2017-01-31 DIAGNOSIS — D62 Acute posthemorrhagic anemia: Secondary | ICD-10-CM | POA: Diagnosis not present

## 2017-01-31 DIAGNOSIS — I480 Paroxysmal atrial fibrillation: Secondary | ICD-10-CM | POA: Insufficient documentation

## 2017-01-31 DIAGNOSIS — M1712 Unilateral primary osteoarthritis, left knee: Secondary | ICD-10-CM | POA: Insufficient documentation

## 2017-01-31 DIAGNOSIS — Z96652 Presence of left artificial knee joint: Secondary | ICD-10-CM

## 2017-01-31 DIAGNOSIS — G629 Polyneuropathy, unspecified: Secondary | ICD-10-CM | POA: Diagnosis not present

## 2017-01-31 DIAGNOSIS — E785 Hyperlipidemia, unspecified: Secondary | ICD-10-CM

## 2017-01-31 DIAGNOSIS — I4891 Unspecified atrial fibrillation: Secondary | ICD-10-CM

## 2017-01-31 NOTE — Progress Notes (Signed)
: Provider:  Randon Goldsmith. Lyn Hollingshead, MD Location:  [facility] Room Number: 107 Place of Service:  SNF ([phone])  PCP: [name], MD Patient Care Team: [name], MD as PCP - General (Internal Medicine) Rennis Golden Lisette Abu, MD as Consulting Physician (Cardiology) Durene Romans, MD as Consulting Physician (Orthopedic Surgery) Cherlyn Roberts, MD as Consulting Physician (Dermatology) Luciana Axe Alford Highland, MD as Consulting Physician (Ophthalmology)  Extended Emergency Contact Information Primary Emergency Contact: [facility] Address: [address]          [place], [place] [address] [place] Home Phone: [phone] Work Phone: [phone] Mobile Phone: [phone] Relation: Daughter Secondary Emergency Contact: Swords,[name] Address: [address]          [place], [place] [address] [place] Home Phone: [phone] Mobile Phone: [phone] Relation: Son     Allergies: Nsaids; Vicodin [hydrocodone-acetaminophen]; and Morphine and related  Chief Complaint  Patient presents with  . New Admit To SNF    following hospitalization 01/24/17 to 01/28/17 left total knee arthroplasty Dr. Charlann Boxer    HPI: Patient is 84 y.o. male with A. fib, arthritis, chronic systolic congestive heart failure, chronic kidney disease, hyperlipidemia, hypertension, who was admitted to [facility] from 9/2-14 for a total knee replacement of left knee. There were no complications. Patient discharge hemoglobin was 10.4. Patient is admitted to skilled nursing facility for OT/PT. While at skilled nursing facility patient will be followed for atrial fibrillation treated with amiodarone and so well to oh, polyneuropathy treated with Neurontin and hyperlipidemia treated with Crestor.  Past Medical History:  Diagnosis Date  . A-fib (HCC)   . Acute systolic CHF (congestive heart failure), NYHA class 3 -- Unclear etilogy (? Afib related) EF down from  60-70% to 40%. 07/05/2012   Echo 2/21: LV upper limits of normal. EF- 40%. Cannot assess Diastolic function - Aortic Sclerosis, Mod-Severe Left Atrial dilation; Mild RV & RA dilation; Moderately elevated PA peak pressure: 83mm Hg     . Arthritis    knees  . Asthma   . ASTHMA 02/06/2007   Qualifier: Diagnosis of  By: Claiborne Billings CMA, Terance Ice    . BENIGN PROSTATIC HYPERTROPHY 05/13/2010   Qualifier: Diagnosis of  By: Amador Cunas  MD, Janett Labella   . Bifascicular block 12/18/2013  . BPH (benign prostatic hypertrophy)   . Bronchospasm, exercise-induced   . CHF (congestive heart failure) (HCC)    systolic, EF 40% (07/07/2012)  . Chronic kidney disease    stabilized, due to infection  . Chronic rhinitis 06/02/2009   Qualifier: Diagnosis of  By: Lovell Sheehan MD, Balinda Quails   . DJD (degenerative joint disease), lumbar 01/18/2012  . Dyslipidemia   . Edema    Bilateral - left lower leg greater than right- wears compression stockings  . General weakness 07/05/2012  . GERD (gastroesophageal reflux disease)   . History of elevated glucose 10/01/2009   Qualifier: Diagnosis of  By: Lovell Sheehan MD, Balinda Quails   . Hx of colonic polyps 11/02/2011  . Hypertension   . INSOMNIA, CHRONIC, MILD 01/02/2008   Qualifier: Diagnosis of  By: Lovell Sheehan MD, Balinda Quails   . Macular degeneration 08-06-13   bilateral -sight impaired  . Obesity   . Paroxysmal Atrial fibrillation - admitted with RVR 01/18/2012  . S/P left THA, AA 08/13/2013  . Shortness of breath     Past Surgical History:  Procedure Laterality Date  . CARDIOVERSION N/A 07/07/2012   Procedure: CARDIOVERSION;  Surgeon: Chrystie Nose, MD;  Location: [facility] ENDOSCOPY;  Service: Cardiovascular;  Laterality: N/A;  . CARDIOVERSION N/A 08/30/2012   Procedure: CARDIOVERSION;  Surgeon: Chrystie Nose, MD;  Location: [facility] ENDOSCOPY;  Service: Cardiovascular;  Laterality: N/A;  . TOTAL HIP ARTHROPLASTY Left 08/13/2013   Procedure: LEFT TOTAL HIP ARTHROPLASTY ANTERIOR APPROACH;  Surgeon: [name], MD;  Location: [facility] ORS;  Service: Orthopedics;  Laterality: Left;  . TOTAL KNEE ARTHROPLASTY Left 01/24/2017   Procedure: LEFT TOTAL KNEE ARTHROPLASTY;  Surgeon: Durene Romans, MD;  Location: [facility] ORS;  Service: Orthopedics;  Laterality: Left;  90 mins  . TRANSTHORACIC ECHOCARDIOGRAM  07/07/2012   ef 40%; mild MR; LA mod-severely dilated; RA mildly dilated; RV systolic function mildly reduced; RV systolic pressure increase - mod pulm htn  . VASCULAR SURGERY  removed varicose veins  . VASECTOMY      Allergies as of 01/31/2017      Reactions   Nsaids    Stomach pain   Vicodin [hydrocodone-acetaminophen] Other (See Comments)   Unknown reaction many years ago   Morphine And Related Nausea And Vomiting   Dizziness, light headed. "Opiates cause tightness in chest".      Medication List       Accurate as of 01/31/17  8:56 AM. Always use your most recent med list.          acetaminophen 500 MG tablet Commonly known as:  TYLENOL Take 2 tablets (1,000 mg total) by mouth every 8 (eight) hours.   albuterol 108 (90 Base) MCG/ACT inhaler Commonly known as:  PROVENTIL HFA;VENTOLIN HFA Inhale 2 puffs into the lungs. Every 6 hours as needed for wheezing or shortness of breath   amiodarone 200 MG tablet Commonly known as:  PACERONE TAKE ONE TABLET BY MOUTH ONCE DAILY   bumetanide 1 MG tablet Commonly known as:  BUMEX Take 1 tablet (1 mg total) by mouth 2 (two) times daily.   CALCIUM 600+D 600-400 MG-UNIT tablet Generic drug:  Calcium Carbonate-Vitamin D Take 1 tablet by mouth 3 (three) times a week. Monday Wednesday and Friday   cetirizine 10 MG tablet Commonly known as:  ZYRTEC Take 10 mg by mouth daily as needed for allergies.   DSS 100 MG Caps Take 100 mg by mouth 2 (two) times daily.   ferrous sulfate 325 (65 FE) MG tablet Commonly known as:  FERROUSUL Take 1 tablet (325 mg total) by mouth 3 (three) times daily with meals.   gabapentin 400 MG capsule Commonly known as:   NEURONTIN TAKE 1 CAPSULE BY MOUTH THREE TIMES DAILY   ICAPS AREDS FORMULA PO Take 1 capsule by mouth 2 (two) times daily.   KLOR-CON M20 20 MEQ tablet Generic drug:  potassium chloride SA TAKE ONE TABLET BY MOUTH ONCE DAILY   Magnesium 250 MG Tabs Take 250 mg by mouth daily.   methocarbamol 500 MG tablet Commonly known as:  ROBAXIN Take 1 tablet (500 mg total) by mouth every 6 (six) hours as needed for muscle spasms.   midodrine 10 MG tablet Commonly known as:  PROAMATINE TAKE ONE TABLET BY MOUTH ONCE DAILY AS NEEDED FOR  SYSTOLIC  BLOOD  PRESSURE  (TOP  NUMBER)  UNDER  90   multivitamin with minerals Tabs tablet Take 1 tablet by mouth daily.   nystatin powder Generic drug:  nystatin Apply 100,000 g topically. Apply topically two times daily as needed to treat any areas of concern for yeast   polyethylene glycol packet Commonly known  as:  MIRALAX / GLYCOLAX Take 17 g by mouth 2 (two) times daily.   rivaroxaban 20 MG Tabs tablet Commonly known as:  XARELTO Take 1 tablet (20 mg total) by mouth daily.   rosuvastatin 20 MG tablet Commonly known as:  CRESTOR TAKE ONE TABLET BY MOUTH ONCE A WEEK, FRIDAY   SPIRIVA HANDIHALER 18 MCG inhalation capsule Generic drug:  tiotropium INHALE ONE PUFF BY MOUTH ONCE DAILY   tamsulosin 0.4 MG Caps capsule Commonly known as:  FLOMAX TAKE 1 CAPSULE BY MOUTH ONCE DAILY   traMADol 50 MG tablet Commonly known as:  ULTRAM Take 50 mg by mouth. Take one tablet every 6 hours as needed for mild pain. Take 2 tablets every 6 hours as needed for moderate to severe pain.       Meds ordered this encounter  Medications  . albuterol (PROVENTIL HFA;VENTOLIN HFA) 108 (90 Base) MCG/ACT inhaler    Sig: Inhale 2 puffs into the lungs. Every 6 hours as needed for wheezing or shortness of breath  . nystatin (NYSTATIN) powder    Sig: Apply 100,000 g topically. Apply topically two times daily as needed to treat any areas of concern for yeast  .  traMADol (ULTRAM) 50 MG tablet    Sig: Take 50 mg by mouth. Take one tablet every 6 hours as needed for mild pain. Take 2 tablets every 6 hours as needed for moderate to severe pain.    Immunization History  Administered Date(s) Administered  . Influenza Split 02/19/2011  . Influenza Whole 05/17/2000, 02/17/2007, 02/15/2008, 02/19/2009, 02/13/2010  . Influenza, High Dose Seasonal PF 03/08/2015, 02/07/2016  . Influenza,inj,Quad PF,6+ Mos 01/22/2013, 02/15/2014  . PPD Test 01/28/2017  . Pneumococcal Conjugate-13 11/20/2013  . Pneumococcal Polysaccharide-23 06/17/1997  . Td 06/17/1997  . Tetanus 11/20/2013    Social History  Substance Use Topics  . Smoking status: Former Smoker    Quit date: 05/17/1964  . Smokeless tobacco: Never Used  . Alcohol use No    Family history is   Family History  Problem Relation Age of Onset  . Alzheimer's disease Mother   . Heart disease Father       Review of Systems  DATA OBTAINED: from patient, nurse GENERAL:  no fevers, fatigue, appetite changes SKIN: Patient with area of yellow slough on sacrum per nursing, painful per patient EYES: No eye pain, redness, discharge EARS: No earache, tinnitus, change in hearing NOSE: No congestion, drainage or bleeding  MOUTH/THROAT: No mouth or tooth pain, No sore throat RESPIRATORY: No cough, wheezing, SOB CARDIAC: No chest pain, palpitations, lower extremity edema  GI: No abdominal pain, No N/V/D or constipation, No heartburn or reflux  GU: No dysuria, frequency or urgency, or incontinence  MUSCULOSKELETAL: No unrelieved bone/joint pain NEUROLOGIC: No headache, dizziness or focal weakness PSYCHIATRIC: No c/o anxiety or sadness   Vitals:   01/31/17 0834  BP: 120/68  Pulse: 66  Resp: 20  Temp: 98.2 F (36.8 C)    SpO2 Readings from Last 1 Encounters:  01/28/17 95%   Body mass index is 33.57 kg/m.     Physical Exam  GENERAL APPEARANCE: Alert, conversant,  No acute distress.  SKIN:  Incision area with expected warmth and swelling HEAD: Normocephalic, atraumatic  EYES: Conjunctiva/lids clear. Pupils round, reactive. EOMs intact.  EARS: External exam WNL, canals clear. Hearing grossly normal.  NOSE: No deformity or discharge.  MOUTH/THROAT: Lips w/o lesions  RESPIRATORY: Breathing is even, unlabored. Lung sounds are clear   CARDIOVASCULAR: Heart  RRR no murmurs, rubs or gallops; left lower extremity is much larger than right, however there is no calf tenderness or tightness or her posterior thigh tenderness or tightness; patient denies leg hurts worse when it did yesterday   GASTROINTESTINAL: Abdomen is soft, non-tender, not distended w/ normal bowel sounds. GENITOURINARY: Bladder non tender, not distended  MUSCULOSKELETAL: No abnormal joints or musculature NEUROLOGIC:  Cranial nerves 2-12 grossly intact. Moves all extremities  PSYCHIATRIC: Mood and affect appropriate to situation, no behavioral issues  Patient Active Problem List   Diagnosis Date Noted  . S/P left TKA 01/24/2017  . S/P total knee replacement 01/24/2017  . Impacted cerumen of left ear 11/05/2016  . Preoperative cardiovascular examination 10/15/2016  . Non-ischemic cardiomyopathy (HCC) 10/15/2016  . Chronic systolic congestive heart failure (HCC) 05/28/2016  . Orthostatic hypotension 09/20/2015  . Impaired glucose tolerance 11/25/2014  . Bifascicular block 12/18/2013  . Bilateral edema of lower extremity 10/25/2013  . CHF (congestive heart failure) (HCC) 08/30/2013  . S/P left THA, AA 08/13/2013  . Chronic anticoagulation 12/06/2012  . Near syncope 12/06/2012  . Acute systolic CHF (congestive heart failure), NYHA class 3 -- Unclear etilogy (? Afib related) EF down from 60-70% to 40%. 07/05/2012  . General weakness 07/05/2012  . Paroxysmal Atrial fibrillation - admitted with RVR 01/18/2012  . DJD (degenerative joint disease), lumbar 01/18/2012  . Hx of colonic polyps 11/02/2011  . BENIGN PROSTATIC  HYPERTROPHY 05/13/2010  . SEBORRHEA CAPITIS 02/13/2010  . History of elevated glucose 10/01/2009  . CHRONIC RHINITIS 06/02/2009  . Dyslipidemia 11/01/2008  . INSOMNIA, CHRONIC, MILD 01/02/2008  . Essential hypertension 02/06/2007  . ASTHMA 02/06/2007      Labs reviewed: Basic Metabolic Panel:    Component Value Date/Time   NA 136 01/26/2017 0551   K 3.5 01/26/2017 0551   CL 99 (L) 01/26/2017 0551   CO2 28 01/26/2017 0551   GLUCOSE 101 (H) 01/26/2017 0551   BUN 17 01/26/2017 0551   CREATININE 0.94 01/26/2017 0551   CREATININE 1.11 04/26/2016 1128   CALCIUM 8.5 (L) 01/26/2017 0551   PROT 6.6 06/02/2015 0940   ALBUMIN 3.9 06/02/2015 0940   AST 13 06/02/2015 0940   ALT 7 06/02/2015 0940   ALKPHOS 64 06/02/2015 0940   BILITOT 0.5 06/02/2015 0940   GFRNONAA >60 01/26/2017 0551   GFRAA >60 01/26/2017 0551     Recent Labs  01/12/17 0830 01/25/17 0454 01/26/17 0551  NA 135 138 136  K 4.0 3.7 3.5  CL 95* 102 99*  CO2 34* 30 28  GLUCOSE 99 102* 101*  BUN CREATININE 0.92 0.94 0.94  CALCIUM 9.1 8.1* 8.5*   Liver Function Tests: No results for input(s): AST, ALT, ALKPHOS, BILITOT, PROT, ALBUMIN in the last 8760 hours. No results for input(s): LIPASE, AMYLASE in the last 8760 hours. No results for input(s): AMMONIA in the last 8760 hours. CBC:  Recent Labs  01/12/17 0830 01/25/17 0454 01/26/17 0551  WBC 5.3 8.9 10.5  HGB 11.2* 9.5* 10.4*  HCT 33.4* 28.2* 31.4*  MCV 94.9 96.2 96.3  PLT 225 192 203   Lipid No results for input(s): CHOL, HDL, LDLCALC, TRIG in the last 8760 hours.  Cardiac Enzymes: No results for input(s): CKTOTAL, CKMB, CKMBINDEX, TROPONINI in the last 8760 hours. BNP:  Recent Labs  04/26/16 1128  BNP 89.3   Lab Results  Component Value Date   MICROALBUR 0.2 11/01/2008   Lab Results  Component Value Date   HGBA1C  5.6 06/02/2015   Lab Results  Component Value Date   TSH 2.55 06/02/2015   Lab Results  Component Value  Date   VITAMINB12 465 12/30/2011   No results found for: FOLATE Lab Results  Component Value Date   IRON 49 12/30/2011    Imaging and Procedures obtained prior to SNF admission: No results found.   Not all labs, radiology exams or other studies done during hospitalization come through on my EPIC note; however they are reviewed by me.    Assessment and Plan\  LEFT KNEE ARTHRITIS/STATUS post LEFT KNEE REPLACEMENT-there were no complications; patient is being prophylaxed with this a relative who is already on for his atrial fib SNF - admitted to skilled nursing facility for OT/PT; will continue Xarelto 20 mg daily as prophylaxis  ACUTE BLOOD LOSS ANEMIA postop-DC hemoglobin is 10.4 SNF - will follow-up CBC, will continue iron 325 mg 3 times a day  ATRIAL FIBRILLATION SNF - stable sinus rhythm, continue amiodarone 200 mg by mouth daily and Xarelto 20 mg by mouth daily  POLYNEUROPATHY SNF - patient without complaints; plan to continue Neurontin 400 mg by mouth 3 times a day  HYPERLIPIDEMIA SNF - not stated as uncontrolled; continue Crestor 20 mg by mouth daily  SACRAL ULCER UNSTAGEABLE SNF - patient off from hospital with same; will be followed by wound care at skilled nursing facility   Time spent greater than 40 minutes;> 50% of time with patient was spent reviewing records, labs, tests and studies, counseling and developing plan of care  Thurston Hole D. Lyn Hollingshead, MD

## 2017-02-03 ENCOUNTER — Encounter: Payer: Self-pay | Admitting: Internal Medicine

## 2017-02-07 ENCOUNTER — Inpatient Hospital Stay (HOSPITAL_COMMUNITY)
Admission: EM | Admit: 2017-02-07 | Discharge: 2017-02-21 | DRG: 853 | Disposition: A | Discharge status: Skilled Nursing Facility | Payer: Medicare Other | Attending: Family Medicine | Admitting: Family Medicine

## 2017-02-07 ENCOUNTER — Encounter (HOSPITAL_COMMUNITY): Payer: Self-pay | Admitting: Emergency Medicine

## 2017-02-07 ENCOUNTER — Emergency Department (HOSPITAL_COMMUNITY): Payer: Medicare Other

## 2017-02-07 DIAGNOSIS — I5043 Acute on chronic combined systolic (congestive) and diastolic (congestive) heart failure: Secondary | ICD-10-CM | POA: Diagnosis present

## 2017-02-07 DIAGNOSIS — Z8249 Family history of ischemic heart disease and other diseases of the circulatory system: Secondary | ICD-10-CM

## 2017-02-07 DIAGNOSIS — E1169 Type 2 diabetes mellitus with other specified complication: Secondary | ICD-10-CM | POA: Diagnosis present

## 2017-02-07 DIAGNOSIS — I110 Hypertensive heart disease with heart failure: Secondary | ICD-10-CM | POA: Diagnosis present

## 2017-02-07 DIAGNOSIS — R6521 Severe sepsis with septic shock: Secondary | ICD-10-CM | POA: Diagnosis present

## 2017-02-07 DIAGNOSIS — L89154 Pressure ulcer of sacral region, stage 4: Secondary | ICD-10-CM | POA: Diagnosis present

## 2017-02-07 DIAGNOSIS — Z96642 Presence of left artificial hip joint: Secondary | ICD-10-CM | POA: Diagnosis present

## 2017-02-07 DIAGNOSIS — I5033 Acute on chronic diastolic (congestive) heart failure: Secondary | ICD-10-CM | POA: Diagnosis not present

## 2017-02-07 DIAGNOSIS — E785 Hyperlipidemia, unspecified: Secondary | ICD-10-CM | POA: Diagnosis present

## 2017-02-07 DIAGNOSIS — Z7901 Long term (current) use of anticoagulants: Secondary | ICD-10-CM | POA: Diagnosis not present

## 2017-02-07 DIAGNOSIS — R509 Fever, unspecified: Secondary | ICD-10-CM

## 2017-02-07 DIAGNOSIS — I10 Essential (primary) hypertension: Secondary | ICD-10-CM | POA: Diagnosis present

## 2017-02-07 DIAGNOSIS — L89159 Pressure ulcer of sacral region, unspecified stage: Secondary | ICD-10-CM | POA: Diagnosis not present

## 2017-02-07 DIAGNOSIS — N400 Enlarged prostate without lower urinary tract symptoms: Secondary | ICD-10-CM | POA: Diagnosis present

## 2017-02-07 DIAGNOSIS — I480 Paroxysmal atrial fibrillation: Secondary | ICD-10-CM | POA: Diagnosis present

## 2017-02-07 DIAGNOSIS — Z23 Encounter for immunization: Secondary | ICD-10-CM

## 2017-02-07 DIAGNOSIS — Z886 Allergy status to analgesic agent status: Secondary | ICD-10-CM

## 2017-02-07 DIAGNOSIS — D649 Anemia, unspecified: Secondary | ICD-10-CM | POA: Diagnosis not present

## 2017-02-07 DIAGNOSIS — I272 Pulmonary hypertension, unspecified: Secondary | ICD-10-CM | POA: Diagnosis present

## 2017-02-07 DIAGNOSIS — Z820 Family history of epilepsy and other diseases of the nervous system: Secondary | ICD-10-CM

## 2017-02-07 DIAGNOSIS — H3530 Unspecified macular degeneration: Secondary | ICD-10-CM | POA: Diagnosis present

## 2017-02-07 DIAGNOSIS — I481 Persistent atrial fibrillation: Secondary | ICD-10-CM | POA: Diagnosis present

## 2017-02-07 DIAGNOSIS — E876 Hypokalemia: Secondary | ICD-10-CM | POA: Diagnosis present

## 2017-02-07 DIAGNOSIS — Z96652 Presence of left artificial knee joint: Secondary | ICD-10-CM | POA: Diagnosis present

## 2017-02-07 DIAGNOSIS — J9601 Acute respiratory failure with hypoxia: Secondary | ICD-10-CM | POA: Diagnosis not present

## 2017-02-07 DIAGNOSIS — I4891 Unspecified atrial fibrillation: Secondary | ICD-10-CM

## 2017-02-07 DIAGNOSIS — M47816 Spondylosis without myelopathy or radiculopathy, lumbar region: Secondary | ICD-10-CM | POA: Diagnosis present

## 2017-02-07 DIAGNOSIS — L089 Local infection of the skin and subcutaneous tissue, unspecified: Secondary | ICD-10-CM | POA: Diagnosis not present

## 2017-02-07 DIAGNOSIS — Z96659 Presence of unspecified artificial knee joint: Secondary | ICD-10-CM | POA: Diagnosis not present

## 2017-02-07 DIAGNOSIS — I5022 Chronic systolic (congestive) heart failure: Secondary | ICD-10-CM

## 2017-02-07 DIAGNOSIS — Z885 Allergy status to narcotic agent status: Secondary | ICD-10-CM

## 2017-02-07 DIAGNOSIS — Z6835 Body mass index (BMI) 35.0-35.9, adult: Secondary | ICD-10-CM

## 2017-02-07 DIAGNOSIS — M869 Osteomyelitis, unspecified: Secondary | ICD-10-CM | POA: Diagnosis present

## 2017-02-07 DIAGNOSIS — L8990 Pressure ulcer of unspecified site, unspecified stage: Secondary | ICD-10-CM

## 2017-02-07 DIAGNOSIS — I5032 Chronic diastolic (congestive) heart failure: Secondary | ICD-10-CM | POA: Diagnosis present

## 2017-02-07 DIAGNOSIS — L8994 Pressure ulcer of unspecified site, stage 4: Secondary | ICD-10-CM | POA: Diagnosis not present

## 2017-02-07 DIAGNOSIS — A419 Sepsis, unspecified organism: Principal | ICD-10-CM

## 2017-02-07 DIAGNOSIS — E669 Obesity, unspecified: Secondary | ICD-10-CM | POA: Diagnosis present

## 2017-02-07 DIAGNOSIS — K219 Gastro-esophageal reflux disease without esophagitis: Secondary | ICD-10-CM | POA: Diagnosis present

## 2017-02-07 DIAGNOSIS — Z86010 Personal history of colonic polyps: Secondary | ICD-10-CM | POA: Diagnosis not present

## 2017-02-07 DIAGNOSIS — I5030 Unspecified diastolic (congestive) heart failure: Secondary | ICD-10-CM

## 2017-02-07 DIAGNOSIS — L8995 Pressure ulcer of unspecified site, unstageable: Secondary | ICD-10-CM | POA: Diagnosis not present

## 2017-02-07 DIAGNOSIS — J45909 Unspecified asthma, uncomplicated: Secondary | ICD-10-CM | POA: Diagnosis present

## 2017-02-07 DIAGNOSIS — R0602 Shortness of breath: Secondary | ICD-10-CM

## 2017-02-07 DIAGNOSIS — Z66 Do not resuscitate: Secondary | ICD-10-CM | POA: Diagnosis present

## 2017-02-07 DIAGNOSIS — Z87891 Personal history of nicotine dependence: Secondary | ICD-10-CM

## 2017-02-07 LAB — TROPONIN I: TROPONIN I: 0.03 ng/mL — AB (ref <0.03)

## 2017-02-07 LAB — COMPREHENSIVE METABOLIC PANEL
ALK PHOS: 88 U/L (ref 38–126)
ALT: 49 U/L (ref 17–63)
AST: 66 U/L — ABNORMAL HIGH (ref 15–41)
Albumin: 2.3 g/dL — ABNORMAL LOW (ref 3.5–5.0)
Anion gap: 9 (ref 5–15)
BUN: 18 mg/dL (ref 6–20)
CALCIUM: 8.1 mg/dL — AB (ref 8.9–10.3)
CO2: 29 mmol/L (ref 22–32)
CREATININE: 1.02 mg/dL (ref 0.61–1.24)
Chloride: 97 mmol/L — ABNORMAL LOW (ref 101–111)
Glucose, Bld: 144 mg/dL — ABNORMAL HIGH (ref 65–99)
Potassium: 3 mmol/L — ABNORMAL LOW (ref 3.5–5.1)
Sodium: 135 mmol/L (ref 135–145)
TOTAL PROTEIN: 5.4 g/dL — AB (ref 6.5–8.1)
Total Bilirubin: 0.3 mg/dL (ref 0.3–1.2)

## 2017-02-07 LAB — CBC WITH DIFFERENTIAL/PLATELET
BASOS PCT: 0 %
Basophils Absolute: 0 10*3/uL (ref 0.0–0.1)
EOS ABS: 0 10*3/uL (ref 0.0–0.7)
Eosinophils Relative: 0 %
HCT: 26 % — ABNORMAL LOW (ref 39.0–52.0)
HEMOGLOBIN: 8.9 g/dL — AB (ref 13.0–17.0)
Lymphocytes Relative: 5 %
Lymphs Abs: 0.7 10*3/uL (ref 0.7–4.0)
MCH: 31.6 pg (ref 26.0–34.0)
MCHC: 34.2 g/dL (ref 30.0–36.0)
MCV: 92.2 fL (ref 78.0–100.0)
MONOS PCT: 11 %
Monocytes Absolute: 1.7 10*3/uL — ABNORMAL HIGH (ref 0.1–1.0)
NEUTROS PCT: 84 %
Neutro Abs: 12.9 10*3/uL — ABNORMAL HIGH (ref 1.7–7.7)
Platelets: 371 10*3/uL (ref 150–400)
RBC: 2.82 MIL/uL — AB (ref 4.22–5.81)
RDW: 13.8 % (ref 11.5–15.5)
WBC: 15.3 10*3/uL — AB (ref 4.0–10.5)

## 2017-02-07 LAB — BLOOD GAS, ARTERIAL
ACID-BASE EXCESS: 5.3 mmol/L — AB (ref 0.0–2.0)
BICARBONATE: 29.1 mmol/L — AB (ref 20.0–28.0)
Drawn by: 308601
O2 CONTENT: 2.5 L/min
O2 Saturation: 98.4 %
PCO2 ART: 46.2 mmHg (ref 32.0–48.0)
Patient temperature: 102.9
pH, Arterial: 7.428 (ref 7.350–7.450)
pO2, Arterial: 135 mmHg — ABNORMAL HIGH (ref 83.0–108.0)

## 2017-02-07 LAB — I-STAT CG4 LACTIC ACID, ED: Lactic Acid, Venous: 0.82 mmol/L (ref 0.5–1.9)

## 2017-02-07 MED ORDER — IBUPROFEN 800 MG PO TABS
800.0000 mg | ORAL_TABLET | Freq: Once | ORAL | Status: AC
  Administered 2017-02-08: 800 mg via ORAL
  Filled 2017-02-07: qty 1

## 2017-02-07 MED ORDER — SODIUM CHLORIDE 0.9 % IV BOLUS (SEPSIS)
1000.0000 mL | Freq: Once | INTRAVENOUS | Status: AC
  Administered 2017-02-07: 1000 mL via INTRAVENOUS

## 2017-02-07 MED ORDER — PIPERACILLIN-TAZOBACTAM 3.375 G IVPB 30 MIN
3.3750 g | Freq: Once | INTRAVENOUS | Status: AC
  Administered 2017-02-07: 3.375 g via INTRAVENOUS
  Filled 2017-02-07: qty 50

## 2017-02-07 MED ORDER — VANCOMYCIN HCL IN DEXTROSE 1-5 GM/200ML-% IV SOLN
1000.0000 mg | Freq: Once | INTRAVENOUS | Status: AC
  Administered 2017-02-07: 1000 mg via INTRAVENOUS
  Filled 2017-02-07: qty 200

## 2017-02-07 NOTE — ED Triage Notes (Signed)
PT presents from [facility] by EMS for fever that began today. EMS advised facility had temp of 103.5 and 1000mg  Tylenol given by facility. EMS established IV access and bolus given due to pressure of 100/40.

## 2017-02-07 NOTE — ED Provider Notes (Signed)
[facility]-EMERGENCY DEPT Provider Note   CSN: [id number] Arrival date & time: 02/07/17  2121     History   Chief Complaint Chief Complaint  Patient presents with  . Fever    HPI [name] is a 83 y.o. male.  Pt presents to the ED with fever from SN[facility].  Pt was admitted from 9/10-9/14 for a left total knee arthroplasty by Dr. Charlann Boxer.  He went to a rehab facility after d/c.  Prior to leaving the hospital, he did develop a sacral decub.  The pt did receive 500 cc of IVFs by EMS and 1000 mg of tylenol by NH prior to transfer.      Past Medical History:  Diagnosis Date  . A-fib (HCC)   . Acute systolic CHF (congestive heart failure), NYHA class 3 -- Unclear etilogy (? Afib related) EF down from 60-70% to 40%. 07/05/2012   Echo 2/21: LV upper limits of normal. EF- 40%. Cannot assess Diastolic function - Aortic Sclerosis, Mod-Severe Left Atrial dilation; Mild RV & RA dilation; Moderately elevated PA peak pressure: 43mm Hg     . Arthritis    knees  . Asthma   . ASTHMA 02/06/2007   Qualifier: Diagnosis of  By: Claiborne Billings CMA, Terance Ice    . BENIGN PROSTATIC HYPERTROPHY 05/13/2010   Qualifier: Diagnosis of  By: Amador Cunas  MD, Janett Labella   . Bifascicular block 12/18/2013  . BPH (benign prostatic hypertrophy)   . Bronchospasm, exercise-induced   . CHF (congestive heart failure) (HCC)    systolic, EF 40% (07/07/2012)  . Chronic kidney disease    stabilized, due to infection  . Chronic rhinitis 06/02/2009   Qualifier: Diagnosis of  By: Lovell Sheehan MD, Balinda Quails   . DJD (degenerative joint disease), lumbar 01/18/2012  . Dyslipidemia   . Edema    Bilateral - left lower leg greater than right- wears compression stockings  . General weakness 07/05/2012  . GERD (gastroesophageal reflux disease)   . History of elevated glucose 10/01/2009   Qualifier: Diagnosis of  By: Lovell Sheehan MD, Balinda Quails   . Hx of colonic polyps 11/02/2011  . Hypertension   . INSOMNIA, CHRONIC, MILD 01/02/2008   Qualifier: Diagnosis of   By: Lovell Sheehan MD, Balinda Quails   . Macular degeneration 08-06-13   bilateral -sight impaired  . Obesity   . Paroxysmal Atrial fibrillation - admitted with RVR 01/18/2012  . S/P left THA, AA 08/13/2013  . Shortness of breath     Patient Active Problem List   Diagnosis Date Noted  . Osteoarthritis of left knee 01/31/2017  . Acute blood loss as cause of postoperative anemia 01/31/2017  . A-fib (HCC) 01/31/2017  . Polyneuropathy 01/31/2017  . Sacral ulcer, with unspecified severity (HCC) 01/31/2017  . S/P left TKA 01/24/2017  . S/P total knee replacement 01/24/2017  . Impacted cerumen of left ear 11/05/2016  . Preoperative cardiovascular examination 10/15/2016  . Non-ischemic cardiomyopathy (HCC) 10/15/2016  . Chronic systolic congestive heart failure (HCC) 05/28/2016  . Orthostatic hypotension 09/20/2015  . Impaired glucose tolerance 11/25/2014  . Bifascicular block 12/18/2013  . Bilateral edema of lower extremity 10/25/2013  . CHF (congestive heart failure) (HCC) 08/30/2013  . S/P left THA, AA 08/13/2013  . Chronic anticoagulation 12/06/2012  . Near syncope 12/06/2012  . Acute systolic CHF (congestive heart failure), NYHA class 3 -- Unclear etilogy (? Afib related) EF down from 60-70% to 40%. 07/05/2012  . General weakness 07/05/2012  . Paroxysmal Atrial fibrillation - admitted with RVR 01/18/2012  .  DJD (degenerative joint disease), lumbar 01/18/2012  . Hx of colonic polyps 11/02/2011  . BENIGN PROSTATIC HYPERTROPHY 05/13/2010  . SEBORRHEA CAPITIS 02/13/2010  . History of elevated glucose 10/01/2009  . CHRONIC RHINITIS 06/02/2009  . Hyperlipidemia 11/01/2008  . INSOMNIA, CHRONIC, MILD 01/02/2008  . Essential hypertension 02/06/2007  . ASTHMA 02/06/2007    Past Surgical History:  Procedure Laterality Date  . CARDIOVERSION N/A 07/07/2012   Procedure: CARDIOVERSION;  Surgeon: Chrystie Nose, MD;  Location: [facility] ENDOSCOPY;  Service: Cardiovascular;  Laterality: N/A;  . CARDIOVERSION  N/A 08/30/2012   Procedure: CARDIOVERSION;  Surgeon: Chrystie Nose, MD;  Location: [facility] ENDOSCOPY;  Service: Cardiovascular;  Laterality: N/A;  . TOTAL HIP ARTHROPLASTY Left 08/13/2013   Procedure: LEFT TOTAL HIP ARTHROPLASTY ANTERIOR APPROACH;  Surgeon: [name], MD;  Location: [facility] ORS;  Service: Orthopedics;  Laterality: Left;  . TOTAL KNEE ARTHROPLASTY Left 01/24/2017   Procedure: LEFT TOTAL KNEE ARTHROPLASTY;  Surgeon: Durene Romans, MD;  Location: [facility] ORS;  Service: Orthopedics;  Laterality: Left;  90 mins  . TRANSTHORACIC ECHOCARDIOGRAM  07/07/2012   ef 40%; mild MR; LA mod-severely dilated; RA mildly dilated; RV systolic function mildly reduced; RV systolic pressure increase - mod pulm htn  . VASCULAR SURGERY  removed varicose veins  . VASECTOMY         Home Medications    Prior to Admission medications   Medication Sig Start Date End Date Taking? Authorizing Provider  acetaminophen (TYLENOL) 500 MG tablet Take 2 tablets (1,000 mg total) by mouth every 8 (eight) hours. 01/24/17  Yes Babish, Molli Hazard, [name]  albuterol (PROVENTIL HFA;VENTOLIN HFA) 108 (90 Base) MCG/ACT inhaler Inhale 2 puffs into the lungs. Every 6 hours as needed for wheezing or shortness of breath   Yes [provider]  amiodarone (PACERONE) 200 MG tablet TAKE ONE TABLET BY MOUTH ONCE DAILY 10/29/16  Yes Hilty, Lisette Abu, MD  bumetanide (BUMEX) 1 MG tablet Take 1 tablet (1 mg total) by mouth 2 (two) times daily. 10/15/16  Yes Hilty, Lisette Abu, MD  Calcium Carbonate-Vitamin D (CALCIUM 600+D) 600-400 MG-UNIT per tablet Take 1 tablet by mouth 3 (three) times a week. Monday Wednesday and Friday   Yes [provider]  cetirizine (ZYRTEC) 10 MG tablet Take 10 mg by mouth daily as needed for allergies.   Yes [provider]  docusate sodium 100 MG CAPS Take 100 mg by mouth 2 (two) times daily. 08/15/13  Yes Babish, Molli Hazard, [name]  doxycycline (ADOXA) 100 MG tablet Take 100 mg by mouth 2 (two) times daily.    Yes [provider]  ferrous sulfate (FERROUSUL) 325 (65 FE) MG tablet Take 1 tablet (325 mg total) by mouth 3 (three) times daily with meals. 01/24/17  Yes Babish, Molli Hazard, [name]  gabapentin (NEURONTIN) 400 MG capsule TAKE 1 CAPSULE BY MOUTH THREE TIMES DAILY 01/14/17  Yes [name], MD  Magnesium 250 MG TABS Take 250 mg by mouth daily.    Yes [provider]  methocarbamol (ROBAXIN) 500 MG tablet Take 1 tablet (500 mg total) by mouth every 6 (six) hours as needed for muscle spasms. 01/24/17  Yes Babish, Molli Hazard, [name]  midodrine (PROAMATINE) 10 MG tablet TAKE ONE TABLET BY MOUTH ONCE DAILY AS NEEDED FOR  SYSTOLIC  BLOOD  PRESSURE  (TOP  NUMBER)  UNDER  90 08/05/16  Yes Hilty, Lisette Abu, MD  Multiple Vitamin (MULTIVITAMIN WITH MINERALS) TABS tablet Take 1 tablet by mouth daily.   Yes [provider]  Multiple Vitamins-Minerals (ICAPS AREDS FORMULA PO) Take 1 capsule by mouth 2 (two) times daily.    Yes [provider]  nystatin (NYSTATIN) powder Apply 100,000 g topically. Apply topically two times daily as needed to treat any areas of concern for yeast   Yes [provider]  polyethylene glycol (MIRALAX / GLYCOLAX) packet Take 17 g by mouth 2 (two) times daily. 01/24/17  Yes Babish, Molli Hazard, [name]  rivaroxaban (XARELTO) 20 MG TABS tablet Take 1 tablet (20 mg total) by mouth daily. 08/05/15  Yes Hilty, Lisette Abu, MD  rosuvastatin (CRESTOR) 20 MG tablet TAKE ONE TABLET BY MOUTH ONCE A WEEK, FRIDAY 12/24/16  Yes Hilty, Lisette Abu, MD  SPIRIVA HANDIHALER 18 MCG inhalation capsule INHALE ONE PUFF BY MOUTH ONCE DAILY 03/18/16  Yes [name], MD  tamsulosin (FLOMAX) 0.4 MG CAPS capsule TAKE 1 CAPSULE BY MOUTH ONCE DAILY 12/24/16  Yes [name], MD  traMADol (ULTRAM) 50 MG tablet Take 50 mg by mouth. Take one tablet every 6 hours as needed for mild pain. Take 2 tablets every 6 hours as needed for moderate to severe pain.   Yes [provider]  KLOR-CON M20 20 MEQ tablet TAKE ONE TABLET BY MOUTH ONCE DAILY Patient not taking: Reported on 02/07/2017 11/12/16   [name], MD    Family History Family History  Problem Relation Age of Onset  . Alzheimer's disease Mother   . Heart disease Father     Social History Social History  Substance Use Topics  . Smoking status: Former Smoker    Quit date: 05/17/1964  . Smokeless tobacco: Never Used  . Alcohol use No     Allergies   Nsaids; Vicodin [hydrocodone-acetaminophen]; and Morphine and related   Review of Systems Review of Systems  Constitutional: Positive for fever.  Skin:       Wound to buttocks  All other systems reviewed and are negative.    Physical Exam Updated Vital Signs BP (!) 95/39 (BP Location: Right Arm)   Pulse 92   Temp (!) 102.9 F (39.4 C) (Rectal)   Resp (!) 36   Ht  (1.676 m)   Wt 92.5 kg (204 lb)   SpO2 93% Comment: room air  BMI 32.93 kg/m   Physical Exam  Constitutional: He is oriented to person, place, and time. He appears well-developed and well-nourished.  HENT:  Head: Normocephalic and atraumatic.  Right Ear: External ear normal.  Left Ear: External ear normal.  Nose: Nose normal.  Mouth/Throat: Oropharynx is clear and moist.  Eyes: Pupils are equal, round, and reactive to light. Conjunctivae and EOM are normal.  Neck: Normal range of motion. Neck supple.  Cardiovascular: Normal rate, regular rhythm, normal heart sounds and intact distal pulses.   Pulmonary/Chest: Breath sounds normal. Tachypnea noted.  Abdominal: Soft. Bowel sounds are normal.  Neurological: He is alert and oriented to person, place, and time.  Skin: Skin is warm.     Psychiatric: He has a normal mood and affect. His behavior is normal. Judgment and thought content normal.  Nursing note and vitals reviewed.    ED Treatments / Results  Labs (all labs ordered are listed, but only abnormal results are displayed) Labs Reviewed   COMPREHENSIVE METABOLIC PANEL - Abnormal; Notable for the following:       Result Value   Potassium 3.0 (*)    Chloride 97 (*)    Glucose, Bld 144 (*)    Calcium  8.1 (*)    Total Protein 5.4 (*)    Albumin 2.3 (*)    AST 66 (*)    All other components within normal limits  CBC WITH DIFFERENTIAL/PLATELET - Abnormal; Notable for the following:    WBC 15.3 (*)    RBC 2.82 (*)    Hemoglobin 8.9 (*)    HCT 26.0 (*)    Neutro Abs 12.9 (*)    Monocytes Absolute 1.7 (*)    All other components within normal limits  TROPONIN I - Abnormal; Notable for the following:    Troponin I 0.03 (*)    All other components within normal limits  BLOOD GAS, ARTERIAL - Abnormal; Notable for the following:    pO2, Arterial 135 (*)    Bicarbonate 29.1 (*)    Acid-Base Excess 5.3 (*)    All other components within normal limits  CULTURE, BLOOD (ROUTINE X 2)  CULTURE, BLOOD (ROUTINE X 2)  URINE CULTURE  AEROBIC CULTURE (SUPERFICIAL SPECIMEN)  URINALYSIS, ROUTINE W REFLEX MICROSCOPIC  I-STAT CG4 LACTIC ACID, ED    EKG  EKG Interpretation  Date/Time:  Monday February 07 2017 22:36:37 EDT Ventricular Rate:  96 PR Interval:    QRS Duration: 176 QT Interval:  408 QTC Calculation: 516 R Axis:   -85 Text Interpretation:  Sinus rhythm Supraventricular bigeminy RBBB and LAFB No significant change since last tracing Confirmed by Jacalyn Lefevre [phone]) on 02/07/2017 10:40:14 PM       Radiology Dg Pelvis 1-2 Views  Result Date: 02/07/2017 CLINICAL DATA:  Fever.  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 EXAM: PELVIS - 1-2 VIEW COMPARISON:  Pelvis radiograph 07/17/2013 FINDINGS: Left hip arthroplasty in expected alignment. No periprosthetic lucency or fracture. No bony destructive change demonstrated radiographically. There is air in the perineum that may be site of walls are, however is specific. IMPRESSION: Peroneal air is nonspecific and may be secondary to decubitus ulcer. No radiographic evidence of  acute osseous abnormality or bony destructive change. Left hip arthroplasty in place. Electronically Signed   By: Rubye Oaks M.D.   On: 02/07/2017 22:21   Dg Chest Port 1 View  Result Date: 02/07/2017 CLINICAL DATA:  Fever. EXAM: PORTABLE CHEST 1 VIEW COMPARISON:  Radiographs 08/06/2013 FINDINGS: Unchanged heart size and mediastinal contours with tortuous atherosclerotic thoracic aorta and mild cardiomegaly. No consolidation to suggest pneumonia. Minimal streaky bibasilar atelectasis. No pulmonary edema, large pleural effusion or pneumothorax. The bones are under mineralized. IMPRESSION: 1. No acute abnormality or explanation for fever. 2. Chronic cardiomegaly and tortuous atherosclerotic thoracic aorta. Electronically Signed   By: Rubye Oaks M.D.   On: 02/07/2017 22:19    Procedures Procedures (including critical care time)  Medications Ordered in ED Medications  sodium chloride 0.9 % bolus 1,000 mL (1,000 mLs Intravenous New Bag/Given 02/07/17 2231)    And  sodium chloride 0.9 % bolus 1,000 mL (not administered)    And  sodium chloride 0.9 % bolus 1,000 mL (not administered)  vancomycin (VANCOCIN) IVPB 1000 mg/200 mL premix (not administered)  ibuprofen (ADVIL,MOTRIN) tablet 800 mg (not administered)  piperacillin-tazobactam (ZOSYN) IVPB 3.375 g (0 g Intravenous Stopped 02/07/17 2323)     Initial Impression / Assessment and Plan / ED Course  I have reviewed the triage vital signs and the nursing notes.  Pertinent labs & imaging results that were available during my care of the patient were reviewed by me and considered in my medical decision making (see chart for details).    CRITICAL  CARE Performed by: Jacalyn Lefevre   Total critical care time: 30 minutes  Critical care time was exclusive of separately billable procedures and treating other patients.  Critical care was necessary to treat or prevent imminent or life-threatening deterioration.  Critical care was time  spent personally by me on the following activities: development of treatment plan with patient and/or surrogate as well as nursing, discussions with consultants, evaluation of patient's response to treatment, examination of patient, obtaining history from patient or surrogate, ordering and performing treatments and interventions, ordering and review of laboratory studies, ordering and review of radiographic studies, pulse oximetry and re-evaluation of patient's condition.   This patient meets SIRS Criteria and may be septic. SIRS = Systemic Inflammatory Response Syndrome  Best Practice Recommends:   Notify the nurse immediately to increase monitoring of patient.   The recent clinical data is shown below. Vitals:   02/07/17 2154 02/07/17 2159 02/07/17 2210  BP:  (!) 95/39   Pulse:  92   Resp:  (!) 36   Temp:  (!) 102.9 F (39.4 C)   TempSrc:  Rectal   SpO2: 96% 92% 93%  Weight:  92.5 kg (204 lb)   Height:  5\' 6"  (1.676 m)    BP has improved with IVFs.   Pt d/w Dr. Antionette Char (triad) for admission.  Final Clinical Impressions(s) / ED Diagnoses   Final diagnoses:  Sepsis, due to unspecified organism (HCC)  Fever, unspecified fever cause  Sacral decubitus ulcer, stage IV (HCC)    New Prescriptions New Prescriptions   No medications on file     Jacalyn Lefevre, MD 02/07/17 2336

## 2017-02-07 NOTE — ED Notes (Signed)
First set of blood cultures obtained from left forearm 66ml each bottle.

## 2017-02-07 NOTE — ED Notes (Signed)
Lab called with result-Troponin 0.03 Dr. Particia Nearing notified

## 2017-02-08 ENCOUNTER — Encounter (HOSPITAL_COMMUNITY): Payer: Self-pay | Admitting: Registered Nurse

## 2017-02-08 ENCOUNTER — Inpatient Hospital Stay (HOSPITAL_COMMUNITY): Payer: Medicare Other | Admitting: Anesthesiology

## 2017-02-08 ENCOUNTER — Encounter (HOSPITAL_COMMUNITY)
Admission: EM | Disposition: A | Discharge status: Skilled Nursing Facility | Payer: Self-pay | Source: Home / Self Care | Attending: Internal Medicine

## 2017-02-08 DIAGNOSIS — R6521 Severe sepsis with septic shock: Secondary | ICD-10-CM

## 2017-02-08 DIAGNOSIS — Z96652 Presence of left artificial knee joint: Secondary | ICD-10-CM

## 2017-02-08 DIAGNOSIS — L8995 Pressure ulcer of unspecified site, unstageable: Secondary | ICD-10-CM

## 2017-02-08 DIAGNOSIS — A419 Sepsis, unspecified organism: Secondary | ICD-10-CM | POA: Insufficient documentation

## 2017-02-08 HISTORY — PX: IRRIGATION AND DEBRIDEMENT ABSCESS: SHX5252

## 2017-02-08 LAB — CBG MONITORING, ED: GLUCOSE-CAPILLARY: 131 mg/dL — AB (ref 65–99)

## 2017-02-08 LAB — BASIC METABOLIC PANEL
ANION GAP: 7 (ref 5–15)
BUN: 15 mg/dL (ref 6–20)
CALCIUM: 7.8 mg/dL — AB (ref 8.9–10.3)
CO2: 29 mmol/L (ref 22–32)
Chloride: 103 mmol/L (ref 101–111)
Creatinine, Ser: 0.89 mg/dL (ref 0.61–1.24)
GFR calc Af Amer: >60 mL/min (ref >60)
GFR calc non Af Amer: >60 mL/min (ref >60)
GLUCOSE: 110 mg/dL — AB (ref 65–99)
Potassium: 3.1 mmol/L — ABNORMAL LOW (ref 3.5–5.1)
Sodium: 139 mmol/L (ref 135–145)

## 2017-02-08 LAB — CBC WITH DIFFERENTIAL/PLATELET
BASOS PCT: 0 %
Basophils Absolute: 0 10*3/uL (ref 0.0–0.1)
EOS PCT: 0 %
Eosinophils Absolute: 0 10*3/uL (ref 0.0–0.7)
HEMATOCRIT: 24.6 % — AB (ref 39.0–52.0)
Hemoglobin: 8.3 g/dL — ABNORMAL LOW (ref 13.0–17.0)
Lymphocytes Relative: 9 %
Lymphs Abs: 1.4 10*3/uL (ref 0.7–4.0)
MCH: 31.9 pg (ref 26.0–34.0)
MCHC: 33.7 g/dL (ref 30.0–36.0)
MCV: 94.6 fL (ref 78.0–100.0)
MONO ABS: 1.3 10*3/uL — AB (ref 0.1–1.0)
MONOS PCT: 8 %
NEUTROS ABS: 13.2 10*3/uL — AB (ref 1.7–7.7)
Neutrophils Relative %: 83 %
PLATELETS: 391 10*3/uL (ref 150–400)
RBC: 2.6 MIL/uL — ABNORMAL LOW (ref 4.22–5.81)
RDW: 14.1 % (ref 11.5–15.5)
WBC: 16 10*3/uL — ABNORMAL HIGH (ref 4.0–10.5)

## 2017-02-08 LAB — URINALYSIS, ROUTINE W REFLEX MICROSCOPIC
Bilirubin Urine: NEGATIVE
GLUCOSE, UA: NEGATIVE mg/dL
HGB URINE DIPSTICK: NEGATIVE
Ketones, ur: NEGATIVE mg/dL
NITRITE: NEGATIVE
PH: 5 (ref 5.0–8.0)
Protein, ur: NEGATIVE mg/dL
SPECIFIC GRAVITY, URINE: 1.01 (ref 1.005–1.030)

## 2017-02-08 LAB — PREPARE RBC (CROSSMATCH)

## 2017-02-08 LAB — GLUCOSE, CAPILLARY
GLUCOSE-CAPILLARY: 105 mg/dL — AB (ref 65–99)
GLUCOSE-CAPILLARY: 123 mg/dL — AB (ref 65–99)
Glucose-Capillary: 100 mg/dL — ABNORMAL HIGH (ref 65–99)
Glucose-Capillary: 101 mg/dL — ABNORMAL HIGH (ref 65–99)

## 2017-02-08 LAB — TROPONIN I
TROPONIN I: 0.03 ng/mL — AB (ref <0.03)
Troponin I: <0.03 ng/mL (ref <0.03)

## 2017-02-08 LAB — MRSA PCR SCREENING: MRSA BY PCR: NEGATIVE

## 2017-02-08 LAB — LACTIC ACID, PLASMA: LACTIC ACID, VENOUS: 0.6 mmol/L (ref 0.5–1.9)

## 2017-02-08 LAB — PROTIME-INR
INR: 1.89
PROTHROMBIN TIME: 21.6 s — AB (ref 11.4–15.2)

## 2017-02-08 LAB — APTT: APTT: 47 s — AB (ref 24–36)

## 2017-02-08 LAB — PROCALCITONIN: PROCALCITONIN: 0.41 ng/mL

## 2017-02-08 SURGERY — IRRIGATION AND DEBRIDEMENT ABSCESS
Anesthesia: General

## 2017-02-08 MED ORDER — ROCURONIUM BROMIDE 50 MG/5ML IV SOSY
PREFILLED_SYRINGE | INTRAVENOUS | Status: AC
  Filled 2017-02-08: qty 5

## 2017-02-08 MED ORDER — PROPOFOL 10 MG/ML IV BOLUS
INTRAVENOUS | Status: AC
  Filled 2017-02-08: qty 20

## 2017-02-08 MED ORDER — MIDODRINE HCL 5 MG PO TABS
10.0000 mg | ORAL_TABLET | Freq: Three times a day (TID) | ORAL | Status: DC
  Administered 2017-02-08 – 2017-02-21 (×34): 10 mg via ORAL
  Filled 2017-02-08 (×41): qty 2

## 2017-02-08 MED ORDER — MIDAZOLAM HCL 2 MG/2ML IJ SOLN
INTRAMUSCULAR | Status: AC
  Filled 2017-02-08: qty 2

## 2017-02-08 MED ORDER — MAGNESIUM SULFATE 2 GM/50ML IV SOLN
2.0000 g | Freq: Once | INTRAVENOUS | Status: AC
  Administered 2017-02-08: 2 g via INTRAVENOUS
  Filled 2017-02-08: qty 50

## 2017-02-08 MED ORDER — DOCUSATE SODIUM 100 MG PO CAPS
100.0000 mg | ORAL_CAPSULE | Freq: Two times a day (BID) | ORAL | Status: DC
Start: 2017-02-08 — End: 2017-02-21
  Administered 2017-02-08 – 2017-02-21 (×21): 100 mg via ORAL
  Filled 2017-02-08 (×25): qty 1

## 2017-02-08 MED ORDER — POTASSIUM CHLORIDE CRYS ER 20 MEQ PO TBCR
20.0000 meq | EXTENDED_RELEASE_TABLET | Freq: Two times a day (BID) | ORAL | Status: DC
  Administered 2017-02-09 – 2017-02-21 (×23): 20 meq via ORAL
  Filled 2017-02-08 (×25): qty 1

## 2017-02-08 MED ORDER — SUGAMMADEX SODIUM 200 MG/2ML IV SOLN
INTRAVENOUS | Status: DC | PRN
  Administered 2017-02-08: 200 mg via INTRAVENOUS

## 2017-02-08 MED ORDER — PHENYLEPHRINE HCL 10 MG/ML IJ SOLN
INTRAMUSCULAR | Status: AC
  Filled 2017-02-08: qty 1

## 2017-02-08 MED ORDER — METHOCARBAMOL 500 MG PO TABS
500.0000 mg | ORAL_TABLET | Freq: Four times a day (QID) | ORAL | Status: DC | PRN
  Administered 2017-02-14 – 2017-02-15 (×2): 500 mg via ORAL
  Filled 2017-02-08 (×2): qty 1

## 2017-02-08 MED ORDER — POLYETHYLENE GLYCOL 3350 17 G PO PACK
17.0000 g | PACK | Freq: Two times a day (BID) | ORAL | Status: DC
  Administered 2017-02-09 – 2017-02-19 (×10): 17 g via ORAL
  Filled 2017-02-08 (×19): qty 1

## 2017-02-08 MED ORDER — TAMSULOSIN HCL 0.4 MG PO CAPS
0.4000 mg | ORAL_CAPSULE | Freq: Every day | ORAL | Status: DC
  Administered 2017-02-08 – 2017-02-21 (×14): 0.4 mg via ORAL
  Filled 2017-02-08 (×14): qty 1

## 2017-02-08 MED ORDER — DEXAMETHASONE SODIUM PHOSPHATE 10 MG/ML IJ SOLN
INTRAMUSCULAR | Status: AC
  Filled 2017-02-08: qty 1

## 2017-02-08 MED ORDER — TIOTROPIUM BROMIDE MONOHYDRATE 18 MCG IN CAPS
18.0000 ug | ORAL_CAPSULE | Freq: Every day | RESPIRATORY_TRACT | Status: DC
  Administered 2017-02-10 – 2017-02-21 (×9): 18 ug via RESPIRATORY_TRACT
  Filled 2017-02-08 (×3): qty 5

## 2017-02-08 MED ORDER — FENTANYL CITRATE (PF) 250 MCG/5ML IJ SOLN
INTRAMUSCULAR | Status: DC | PRN
  Administered 2017-02-08 (×2): 50 ug via INTRAVENOUS

## 2017-02-08 MED ORDER — TRAMADOL HCL 50 MG PO TABS: 50.0000 mg | ORAL_TABLET | Freq: Four times a day (QID) | ORAL | Status: DC | PRN

## 2017-02-08 MED ORDER — SODIUM CHLORIDE 0.9 % IV SOLN
250.0000 mL | INTRAVENOUS | Status: DC | PRN
  Administered 2017-02-08: 15:00:00 via INTRAVENOUS

## 2017-02-08 MED ORDER — INSULIN ASPART 100 UNIT/ML ~~LOC~~ SOLN: 0.0000 [IU] | Freq: Every day | SUBCUTANEOUS | Status: DC

## 2017-02-08 MED ORDER — LIDOCAINE 2% (20 MG/ML) 5 ML SYRINGE
INTRAMUSCULAR | Status: AC
  Filled 2017-02-08: qty 5

## 2017-02-08 MED ORDER — ACETAMINOPHEN 325 MG PO TABS
650.0000 mg | ORAL_TABLET | Freq: Four times a day (QID) | ORAL | Status: DC | PRN
  Administered 2017-02-11 – 2017-02-13 (×2): 650 mg via ORAL
  Filled 2017-02-08 (×2): qty 2

## 2017-02-08 MED ORDER — KETOROLAC TROMETHAMINE 15 MG/ML IJ SOLN
15.0000 mg | Freq: Four times a day (QID) | INTRAMUSCULAR | Status: DC | PRN
  Administered 2017-02-08 – 2017-02-10 (×2): 15 mg via INTRAVENOUS
  Filled 2017-02-08 (×2): qty 1

## 2017-02-08 MED ORDER — PROPOFOL 10 MG/ML IV BOLUS
INTRAVENOUS | Status: DC | PRN
  Administered 2017-02-08: 60 mg via INTRAVENOUS
  Administered 2017-02-08: 20 mg via INTRAVENOUS

## 2017-02-08 MED ORDER — 0.9 % SODIUM CHLORIDE (POUR BTL) OPTIME
TOPICAL | Status: DC | PRN
  Administered 2017-02-08: 1000 mL

## 2017-02-08 MED ORDER — FENTANYL CITRATE (PF) 100 MCG/2ML IJ SOLN
INTRAMUSCULAR | Status: AC
  Filled 2017-02-08: qty 2

## 2017-02-08 MED ORDER — PROSIGHT PO TABS
1.0000 | ORAL_TABLET | Freq: Two times a day (BID) | ORAL | Status: DC
  Administered 2017-02-08 – 2017-02-21 (×26): 1 via ORAL
  Filled 2017-02-08 (×26): qty 1

## 2017-02-08 MED ORDER — TRAMADOL HCL 50 MG PO TABS
50.0000 mg | ORAL_TABLET | Freq: Four times a day (QID) | ORAL | Status: DC | PRN
  Administered 2017-02-08 (×2): 50 mg via ORAL
  Administered 2017-02-09 – 2017-02-12 (×6): 100 mg via ORAL
  Administered 2017-02-13: 50 mg via ORAL
  Administered 2017-02-13: 100 mg via ORAL
  Administered 2017-02-13: 50 mg via ORAL
  Administered 2017-02-14 – 2017-02-17 (×7): 100 mg via ORAL
  Administered 2017-02-18: 50 mg via ORAL
  Administered 2017-02-18 – 2017-02-21 (×4): 100 mg via ORAL
  Filled 2017-02-08: qty 2
  Filled 2017-02-08: qty 1
  Filled 2017-02-08 (×4): qty 2
  Filled 2017-02-08: qty 1
  Filled 2017-02-08 (×2): qty 2
  Filled 2017-02-08: qty 1
  Filled 2017-02-08 (×5): qty 2
  Filled 2017-02-08: qty 1
  Filled 2017-02-08 (×6): qty 2
  Filled 2017-02-08: qty 1

## 2017-02-08 MED ORDER — VANCOMYCIN HCL IN DEXTROSE 750-5 MG/150ML-% IV SOLN
750.0000 mg | Freq: Two times a day (BID) | INTRAVENOUS | Status: DC
  Administered 2017-02-08: 750 mg via INTRAVENOUS
  Filled 2017-02-08 (×2): qty 150

## 2017-02-08 MED ORDER — POTASSIUM CHLORIDE 10 MEQ/100ML IV SOLN
10.0000 meq | INTRAVENOUS | Status: AC
  Administered 2017-02-08: 10 meq via INTRAVENOUS
  Filled 2017-02-08: qty 100

## 2017-02-08 MED ORDER — ONDANSETRON HCL 4 MG/2ML IJ SOLN: 4.0000 mg | Freq: Once | INTRAMUSCULAR | Status: DC | PRN

## 2017-02-08 MED ORDER — SODIUM CHLORIDE 0.9% FLUSH
3.0000 mL | Freq: Two times a day (BID) | INTRAVENOUS | Status: DC
  Administered 2017-02-08 – 2017-02-15 (×5): 3 mL via INTRAVENOUS

## 2017-02-08 MED ORDER — PHENYLEPHRINE HCL 10 MG/ML IJ SOLN
INTRAVENOUS | Status: DC | PRN
  Administered 2017-02-08: 60 ug/min via INTRAVENOUS

## 2017-02-08 MED ORDER — DAKINS (1/4 STRENGTH) 0.125 % EX SOLN
Freq: Once | CUTANEOUS | Status: DC
  Filled 2017-02-08: qty 473

## 2017-02-08 MED ORDER — INSULIN ASPART 100 UNIT/ML ~~LOC~~ SOLN
0.0000 [IU] | Freq: Three times a day (TID) | SUBCUTANEOUS | Status: DC
  Administered 2017-02-09: 1 [IU] via SUBCUTANEOUS
  Administered 2017-02-09: 2 [IU] via SUBCUTANEOUS
  Administered 2017-02-10 – 2017-02-21 (×11): 1 [IU] via SUBCUTANEOUS

## 2017-02-08 MED ORDER — ONDANSETRON HCL 4 MG PO TABS: 4.0000 mg | ORAL_TABLET | Freq: Four times a day (QID) | ORAL | Status: DC | PRN

## 2017-02-08 MED ORDER — CHLORHEXIDINE GLUCONATE CLOTH 2 % EX PADS
6.0000 | MEDICATED_PAD | Freq: Once | CUTANEOUS | Status: AC
  Administered 2017-02-09: 6 via TOPICAL

## 2017-02-08 MED ORDER — SODIUM CHLORIDE 0.9% FLUSH
3.0000 mL | Freq: Two times a day (BID) | INTRAVENOUS | Status: DC
  Administered 2017-02-08 – 2017-02-16 (×4): 3 mL via INTRAVENOUS

## 2017-02-08 MED ORDER — HYDROCORTISONE NA SUCCINATE PF 100 MG IJ SOLR
50.0000 mg | Freq: Four times a day (QID) | INTRAMUSCULAR | Status: DC
  Administered 2017-02-08 – 2017-02-09 (×4): 50 mg via INTRAVENOUS
  Filled 2017-02-08 (×4): qty 2

## 2017-02-08 MED ORDER — ONDANSETRON HCL 4 MG/2ML IJ SOLN
4.0000 mg | Freq: Four times a day (QID) | INTRAMUSCULAR | Status: DC | PRN
  Administered 2017-02-10: 4 mg via INTRAVENOUS
  Filled 2017-02-08: qty 2

## 2017-02-08 MED ORDER — AMIODARONE HCL 200 MG PO TABS
200.0000 mg | ORAL_TABLET | Freq: Every day | ORAL | Status: DC
  Administered 2017-02-08 – 2017-02-21 (×15): 200 mg via ORAL
  Filled 2017-02-08 (×15): qty 1

## 2017-02-08 MED ORDER — SODIUM CHLORIDE 0.9% FLUSH: 3.0000 mL | INTRAVENOUS | Status: DC | PRN

## 2017-02-08 MED ORDER — PIPERACILLIN-TAZOBACTAM 3.375 G IVPB
3.3750 g | Freq: Three times a day (TID) | INTRAVENOUS | Status: DC
  Administered 2017-02-08 – 2017-02-18 (×32): 3.375 g via INTRAVENOUS
  Filled 2017-02-08 (×31): qty 50

## 2017-02-08 MED ORDER — PHENYLEPHRINE 40 MCG/ML (10ML) SYRINGE FOR IV PUSH (FOR BLOOD PRESSURE SUPPORT)
PREFILLED_SYRINGE | INTRAVENOUS | Status: DC | PRN
  Administered 2017-02-08 (×2): 80 ug via INTRAVENOUS
  Administered 2017-02-08: 40 ug via INTRAVENOUS

## 2017-02-08 MED ORDER — SUGAMMADEX SODIUM 200 MG/2ML IV SOLN
INTRAVENOUS | Status: AC
  Filled 2017-02-08: qty 2

## 2017-02-08 MED ORDER — CALCIUM CARBONATE-VITAMIN D 500-200 MG-UNIT PO TABS
1.0000 | ORAL_TABLET | ORAL | Status: DC
  Administered 2017-02-09 – 2017-02-21 (×6): 1 via ORAL
  Filled 2017-02-08 (×2): qty 1

## 2017-02-08 MED ORDER — GABAPENTIN 400 MG PO CAPS
400.0000 mg | ORAL_CAPSULE | Freq: Three times a day (TID) | ORAL | Status: DC
  Administered 2017-02-08 – 2017-02-12 (×11): 400 mg via ORAL
  Filled 2017-02-08 (×12): qty 1

## 2017-02-08 MED ORDER — ACETAMINOPHEN 650 MG RE SUPP: 650.0000 mg | Freq: Four times a day (QID) | RECTAL | Status: DC | PRN

## 2017-02-08 MED ORDER — CHLORHEXIDINE GLUCONATE CLOTH 2 % EX PADS
6.0000 | MEDICATED_PAD | Freq: Once | CUTANEOUS | Status: AC
  Administered 2017-02-08: 6 via TOPICAL

## 2017-02-08 MED ORDER — MAGNESIUM OXIDE 400 (241.3 MG) MG PO TABS
400.0000 mg | ORAL_TABLET | Freq: Every day | ORAL | Status: DC
  Administered 2017-02-09 – 2017-02-21 (×13): 400 mg via ORAL
  Filled 2017-02-08 (×14): qty 1

## 2017-02-08 MED ORDER — ONDANSETRON HCL 4 MG/2ML IJ SOLN
INTRAMUSCULAR | Status: AC
  Filled 2017-02-08: qty 2

## 2017-02-08 MED ORDER — ROCURONIUM BROMIDE 10 MG/ML (PF) SYRINGE
PREFILLED_SYRINGE | INTRAVENOUS | Status: DC | PRN
  Administered 2017-02-08: 25 mg via INTRAVENOUS

## 2017-02-08 MED ORDER — LIDOCAINE 2% (20 MG/ML) 5 ML SYRINGE
INTRAMUSCULAR | Status: DC | PRN
  Administered 2017-02-08: 60 mg via INTRAVENOUS

## 2017-02-08 MED ORDER — FAMOTIDINE IN NACL 20-0.9 MG/50ML-% IV SOLN
20.0000 mg | Freq: Two times a day (BID) | INTRAVENOUS | Status: DC
  Administered 2017-02-08 – 2017-02-12 (×11): 20 mg via INTRAVENOUS
  Filled 2017-02-08 (×12): qty 50

## 2017-02-08 MED ORDER — FENTANYL CITRATE (PF) 100 MCG/2ML IJ SOLN
25.0000 ug | INTRAMUSCULAR | Status: DC | PRN
  Administered 2017-02-08 (×2): 25 ug via INTRAVENOUS
  Administered 2017-02-08: 50 ug via INTRAVENOUS

## 2017-02-08 SURGICAL SUPPLY — 30 items
ADH SKN CLS APL DERMABOND .7 (GAUZE/BANDAGES/DRESSINGS)
BLADE SURG SZ11 CARB STEEL (BLADE) ×1 IMPLANT
BNDG GAUZE ELAST 4 BULKY (GAUZE/BANDAGES/DRESSINGS) ×1 IMPLANT
CHLORAPREP W/TINT 26ML (MISCELLANEOUS) ×1 IMPLANT
COVER SURGICAL LIGHT HANDLE (MISCELLANEOUS) ×2 IMPLANT
DECANTER SPIKE VIAL GLASS SM (MISCELLANEOUS) IMPLANT
DERMABOND ADVANCED (GAUZE/BANDAGES/DRESSINGS)
DERMABOND ADVANCED .7 DNX12 (GAUZE/BANDAGES/DRESSINGS) ×1 IMPLANT
DRAPE LAPAROSCOPIC ABDOMINAL (DRAPES) IMPLANT
DRAPE LAPAROTOMY T 98X78 PEDS (DRAPES) ×1 IMPLANT
DRAPE LAPAROTOMY TRNSV 102X78 (DRAPE) IMPLANT
DRSG PAD ABDOMINAL 8X10 ST (GAUZE/BANDAGES/DRESSINGS) IMPLANT
ELECT REM PT RETURN 15FT ADLT (MISCELLANEOUS) ×2 IMPLANT
GAUZE SPONGE 4X4 12PLY STRL (GAUZE/BANDAGES/DRESSINGS) IMPLANT
GLOVE BIO SURGEON STRL SZ 6 (GLOVE) ×2 IMPLANT
GLOVE INDICATOR 6.5 STRL GRN (GLOVE) ×2 IMPLANT
GLOVE SURG SS PI 7.0 STRL IVOR (GLOVE) ×2 IMPLANT
GOWN STRL REUS W/TWL LRG LVL3 (GOWN DISPOSABLE) ×4 IMPLANT
GOWN STRL REUS W/TWL XL LVL3 (GOWN DISPOSABLE) ×1 IMPLANT
KIT BASIN OR (CUSTOM PROCEDURE TRAY) ×2 IMPLANT
LUBRICANT JELLY K Y 4OZ (MISCELLANEOUS) IMPLANT
PACK GENERAL/GYN (CUSTOM PROCEDURE TRAY) ×2 IMPLANT
PAD ABD 7.5X8 STRL (GAUZE/BANDAGES/DRESSINGS) ×1 IMPLANT
SUT MNCRL AB 4-0 PS2 18 (SUTURE) IMPLANT
SUT VICRYL 3 0 (SUTURE) IMPLANT
SWAB COLLECTION DEVICE MRSA (MISCELLANEOUS) IMPLANT
SWAB CULTURE ESWAB REG 1ML (MISCELLANEOUS) IMPLANT
TAPE CLOTH SURG 6X10 WHT LF (GAUZE/BANDAGES/DRESSINGS) ×1 IMPLANT
TOWEL OR 17X26 10 PK STRL BLUE (TOWEL DISPOSABLE) ×2 IMPLANT
TOWEL OR NON WOVEN STRL DISP B (DISPOSABLE) ×2 IMPLANT

## 2017-02-08 NOTE — Op Note (Signed)
Operative Note  [name]  [id number]  [id number]  02/08/2017   Surgeon: Leeroy Bock A ConnorMD  Assistant: Carlena Bjornstad [name]  Procedure performed: debridement of sacral decubitus ulcer, stage IV, final wound 10x10x4cm deep with 5cm tunneling bilaterally into the gluteus, exposed bone/periosteum in base of wound  Preop diagnosis: unstageable sacral decubitus ulce Post-op diagnosis/intraop findings: stage IV sacral decubitus ulcer with purulent and necrotic debris  Specimens: none Retained items: kerlix packing EBL: 10cc Complications: none  Description of procedure: After obtaining informed consent the patient was taken to the operating room and placed supine on operating room table wheregeneral endotracheal anesthesia was initiated, preoperative antibiotics were administered, SCDs applied, and a formal timeout was performed. The patient was then turned to the prone position with all pressure points appropriately padded. The presacral area was prepped with Betadine and draped in usual sterile fashion. Using blunt dissection and electrocautery, all necrotic tissue possible was excised. The dissection was carried out to bleeding tissue circumferentially around the wound and a small margin of skin was excised to reduce the amount of tunneling. All tissues were probed bluntly and all purulence was evacuated. There is exposed bone at the base of the wound with overlying necrotic periosteum. The wound tunnels a proximally 5 cm into his right gluteus and approximately 5 cm into his left gluteus, as well as about 2 cm inferiorly on the left side.There is also some remaining necrotic muscle and fascia at the right superior base of the wound. Once we had debrided everything possible, hemostasis was ensured within the wound using cautery. The wound was then packed with 0.25% percent Dakin's soaked Kerlex and covered with a dry ABD and tape. The patient was then returned to the supine position,  awakened, extubated and taken to PACU in stable condition.   All counts were correct at the completion of the case.

## 2017-02-08 NOTE — Progress Notes (Addendum)
PROGRESS NOTE  [name] ZOX:[id number] DOB: 04/10/1927 DOA: 02/07/2017 PCP: [name], MD  HPI/Recap of past 24 hours:  C/o sacral pain Denies sob, no chest pain No fever Assessment/Plan: Principal Problem:   Sepsis, unspecified organism [facility]) Active Problems:   Essential hypertension   Chronic systolic congestive heart failure (HCC)   S/P total knee replacement   A-fib (HCC)   Infected pressure ulcer   Hypokalemia   Normocytic anemia   1. Sepsis/septic shock secondary to infected sacral pressure ulcer  - Pt presents from SNF with fever and hypotension, tachypneic  -  he received hydration initially , but due to h/o diastolic chf, he is not on continuous ivf, home diuretic held since admission, currently no lower extremity edema, cxr no pulmonary edema -started on stress dose steroids due to borderline bp.  - Blood cultures and wound culture collected in ED, empiric vancomycin and Zosyn initiated  , mrsa screening negative, will d/c vanc -general surgery consulted, plan to OR today for debridement ( xarelto held since admission)  2. Paroxysmal atrial fibrillation  - In a sinus rhythm on presentation  - CHADS-VASc is at least 22 (age x2, CHF)  -  continue amiodarone, but hold Xarelto in light of recent Hgb drop and  needs surgical debridement    3. Chronic diastolic CHF  -  he received hydration initially , but due to h/o diastolic chf, he is not on continuous ivf, home diuretic held since admission, currently no lower extremity edema, cxr no pulmonary edema - follow daily wts and I/O's  - Will likely need diuresis once acute condition stabilized    4. Hypokalemia  - Serum potassium is 3.0 on admission  - Treated with 30 mEq IV potassium , continue oral supplement,  - Continue cardiac monitoring, check k/mag in am  5. Anemia , normocytic - Hgb is 8.9 on admission, down from 10.4 two weeks earlier  - Denies melena or hematochezia  - Likely secondary  to chronic disease and recent surgery, bloody drainage from sacral wound  - Type and screen, follow H&H  -xarelto held  recent left knee surgery: discharged to snf on 9/14  DVT prophylaxis: SCD's  Code Status: DNR  (Dr Antionette Char Discussed the critical nature of this illness with pt's daughter, confirmed his wishes for DNR/DNI status  ) Family Communication: patient Disposition Plan: not ready for discharge, remain in stepdown    Consultants:  general surgery  Procedures:  Plan to OR for wound debridment  Antibiotics:  vanc from admission to 9/25  Zosyn from admission   Objective: BP (!) 102/39   Pulse 76   Temp (!) 102.9 F (39.4 C) (Rectal)   Resp (!) 24   Ht  (1.676 m)   Wt 92.5 kg (204 lb)   SpO2 100%   BMI 32.93 kg/m   Intake/Output Summary (Last 24 hours) at 02/08/17 0817 Last data filed at 02/07/17 2154  Gross per 24 hour  Intake              500 ml  Output                0 ml  Net              500 ml   Filed Weights   02/07/17 2159  Weight: 92.5 kg (204 lb)    Exam: Patient is examined daily including today on 02/08/2017, exams remain the same as of yesterday except that has changed  General:  NAD  Cardiovascular: RRR  Respiratory: CTABL  Abdomen: Soft/ND/NT, positive BS  Musculoskeletal: left knee surgical scar healing well, no erythema, no Edema  Neuro: alert, oriented   unstageable sacral wound with odor  Data Reviewed: Basic Metabolic Panel:  Recent Labs Lab 02/07/17 2227 02/08/17 0717  NA 135 139  K 3.0* 3.1*  CL 97* 103  CO2 29 29  GLUCOSE 144* 110*  BUN 18 15  CREATININE 1.02 0.89  CALCIUM 8.1* 7.8*   Liver Function Tests:  Recent Labs Lab 02/07/17 2227  AST 66*  ALT 49  ALKPHOS 88  BILITOT 0.3  PROT 5.4*  ALBUMIN 2.3*   No results for input(s): LIPASE, AMYLASE in the last 168 hours. No results for input(s): AMMONIA in the last 168 hours. CBC:  Recent Labs Lab 02/07/17 2227 02/08/17 0717  WBC  15.3* 16.0*  NEUTROABS 12.9* 13.2*  HGB 8.9* 8.3*  HCT 26.0* 24.6*  MCV 92.2 94.6  PLT 371 391   Cardiac Enzymes:    Recent Labs Lab 02/07/17 2227 02/08/17 0717  TROPONINI 0.03* <0.03   BNP (last 3 results)  Recent Labs  04/26/16 1128  BNP 89.3    ProBNP (last 3 results) No results for input(s): PROBNP in the last 8760 hours.  CBG:  Recent Labs Lab 02/08/17 0330 02/08/17 0757  GLUCAP 131* 100*    Recent Results (from the past 240 hour(s))  Wound or Superficial Culture     Status: None (Preliminary result)   Collection Time: 02/07/17 10:04 PM  Result Value Ref Range Status   Specimen Description BACK  Final   Special Requests NONE  Final   Gram Stain   Final    FEW WBC PRESENT, PREDOMINANTLY PMN MODERATE GRAM NEGATIVE RODS MODERATE GRAM POSITIVE COCCI FEW GRAM POSITIVE RODS Performed at [facility], [address] [place], [place] [address]    Culture PENDING  Incomplete   Report Status PENDING  Incomplete  MRSA PCR Screening     Status: None   Collection Time: 02/08/17  5:29 AM  Result Value Ref Range Status   MRSA by PCR NEGATIVE NEGATIVE Final    Comment:        The GeneXpert MRSA Assay (FDA approved for NASAL specimens only), is one component of a comprehensive MRSA colonization surveillance program. It is not intended to diagnose MRSA infection nor to guide or monitor treatment for MRSA infections.      Studies: Dg Pelvis 1-2 Views  Result Date: 02/07/2017 CLINICAL DATA:  Fever.  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 EXAM: PELVIS - 1-2 VIEW COMPARISON:  Pelvis radiograph 07/17/2013 FINDINGS: Left hip arthroplasty in expected alignment. No periprosthetic lucency or fracture. No bony destructive change demonstrated radiographically. There is air in the perineum that may be site of walls are, however is specific. IMPRESSION: Peroneal air is nonspecific and may be secondary to decubitus ulcer. No radiographic evidence of acute osseous  abnormality or bony destructive change. Left hip arthroplasty in place. Electronically Signed   By: Rubye Oaks M.D.   On: 02/07/2017 22:21   Dg Chest Port 1 View  Result Date: 02/07/2017 CLINICAL DATA:  Fever. EXAM: PORTABLE CHEST 1 VIEW COMPARISON:  Radiographs 08/06/2013 FINDINGS: Unchanged heart size and mediastinal contours with tortuous atherosclerotic thoracic aorta and mild cardiomegaly. No consolidation to suggest pneumonia. Minimal streaky bibasilar atelectasis. No pulmonary edema, large pleural effusion or pneumothorax. The bones are under mineralized. IMPRESSION: 1. No acute abnormality or explanation for fever. 2.  Chronic cardiomegaly and tortuous atherosclerotic thoracic aorta. Electronically Signed   By: Rubye Oaks M.D.   On: 02/07/2017 22:19    Scheduled Meds: . amiodarone  200 mg Oral Daily  . [START ON 02/09/2017] calcium-vitamin D  1 tablet Oral Once per day on Mon Wed Fri  . docusate sodium  100 mg Oral BID  . gabapentin  400 mg Oral TID  . hydrocortisone sod succinate (SOLU-CORTEF) inj  50 mg Intravenous Q6H  . insulin aspart  0-5 Units Subcutaneous QHS  . insulin aspart  0-9 Units Subcutaneous TID WC  . magnesium oxide  400 mg Oral Daily  . midodrine  10 mg Oral TID WC  . multivitamin  1 tablet Oral BID  . [START ON 02/09/2017] polyethylene glycol  17 g Oral BID  . [START ON 02/09/2017] potassium chloride SA  20 mEq Oral BID  . sodium chloride flush  3 mL Intravenous Q12H  . sodium chloride flush  3 mL Intravenous Q12H  . tamsulosin  0.4 mg Oral Daily  . tiotropium  18 mcg Inhalation Daily    Continuous Infusions: . sodium chloride    . famotidine (PEPCID) IV 20 mg (02/08/17 0337)  . piperacillin-tazobactam (ZOSYN)  IV 3.375 g (02/08/17 0643)  . vancomycin       Time spent: 35 mins from 8am to 8:30 am I have personally reviewed and interpreted on  02/08/2017 daily labs, tele strips, imagings as discussed above under data review session and assessment  and plans.  I reviewed all nursing notes, pharmacy notes, consultant notes,  vitals, pertinent old records  I have discussed plan of care as described above with RN , patient  on 02/08/2017   Adolphe Fortunato MD, PhD  [facility] Pager [phone]. If 7PM-7AM, please contact night-coverage at [email or web address], password [facility] 02/08/2017, 8:17 AM  LOS: 1 day

## 2017-02-08 NOTE — Anesthesia Postprocedure Evaluation (Signed)
Anesthesia Post Note  Patient: [name]  Procedure(s) Performed: Procedure(s) (LRB): IRRIGATION AND DEBRIDEMENT SACRAL ULCER (N/A)     Patient location during evaluation: PACU Anesthesia Type: General Level of consciousness: awake and alert Pain management: pain level controlled Vital Signs Assessment: post-procedure vital signs reviewed and stable Respiratory status: spontaneous breathing, nonlabored ventilation, respiratory function stable and patient connected to nasal cannula oxygen Cardiovascular status: blood pressure returned to baseline and stable Postop Assessment: no apparent nausea or vomiting Anesthetic complications: no    Last Vitals:  Vitals:   02/08/17 1645 02/08/17 1705  BP: (!) 101/49 (!) 94/44  Pulse: 65 65  Resp: (!) 24 17  Temp: 36.5 C 36.5 C  SpO2: 98% 98%    Last Pain:  Vitals:   02/08/17 1653  TempSrc:   PainSc: 4                  Beryle Lathe

## 2017-02-08 NOTE — H&P (Signed)
History and Physical    [name] BJY:[id number] DOB: Nov 04, 1926 DOA: 02/07/2017  PCP: [name], MD   Patient coming from: SNF  Chief Complaint: Fevers, low BP, sacral ulcer with foul odor and drainage   HPI: [name] is a 83 y.o. male with medical history significant for paroxysmal atrial fibrillation on Xarelto, chronic diastolic CHF, hypotension recently started on Midrin, and osteoarthritis status post total knee replacement 2 weeks ago, now presenting to the emergency department from his SNF for evaluation of fevers, low blood pressure, and severe pain from a sacral ulcer. Patient is accompanied by his daughter who assists with the history. He had reportedly developed a small red spot on his sacrum that was noted during his recent hospitalization for knee surgery. Since his discharge to a skilled nursing facility, there is been skin breakdown and an expanding ulcer. Family reports noting that the ulcer has developed some black tissue, drainage, and without foul odor. Patient has been febrile at his nursing facility and was treated with acetaminophen. He had a low blood pressure, was given 500 mL normal saline, and sent to the ED for further evaluation.  ED Course: Upon arrival to the ED, patient is found to be febrile to 39.4, saturating 92% on room air, tachypneic in the 30s, and with blood pressure 95/34. EKG features a sinus rhythm with PACs, chronic RBBB, and chronic LAFB. Chest x-ray is negative for acute cardiopulmonary disease. Rated rest of the pelvis are negative for definite osseous abnormality, but notable for a perineal tear, nonspecific, but likely related to the overlying decubitus ulcer. Chemistry panel reveals a potassium of 3.0 and AST of 66. CBC is notable for a leukocytosis to 15,300 and a hemoglobin of 8.9, down from 10.4 two weeks earlier. Troponin is at the borderline of elevation of 0.03. Lactic acid is reassuring at 0.82. Blood cultures were collected  in the ED, urinalysis remains pending, 30 cc/kg of normal saline were given, and the patient was started on empiric vancomycin and Zosyn. MAP has remained in the 60 range and patient has developed some respiratory distress after the fluid boluses. He is critically ill and may not survive this illness; this was discussed with his daughter at the bedside. She confirms that he has expressed wishes for DO NOT RESUSCITATE/DO NOT INTUBATE.   Review of Systems:  All other systems reviewed and apart from HPI, are negative.  Past Medical History:  Diagnosis Date  . A-fib (HCC)   . Acute systolic CHF (congestive heart failure), NYHA class 3 -- Unclear etilogy (? Afib related) EF down from 60-70% to 40%. 07/05/2012   Echo 2/21: LV upper limits of normal. EF- 40%. Cannot assess Diastolic function - Aortic Sclerosis, Mod-Severe Left Atrial dilation; Mild RV & RA dilation; Moderately elevated PA peak pressure: 43mm Hg     . Arthritis    knees  . Asthma   . ASTHMA 02/06/2007   Qualifier: Diagnosis of  By: Claiborne Billings CMA, Terance Ice    . BENIGN PROSTATIC HYPERTROPHY 05/13/2010   Qualifier: Diagnosis of  By: Amador Cunas  MD, Janett Labella   . Bifascicular block 12/18/2013  . BPH (benign prostatic hypertrophy)   . Bronchospasm, exercise-induced   . CHF (congestive heart failure) (HCC)    systolic, EF 40% (07/07/2012)  . Chronic kidney disease    stabilized, due to infection  . Chronic rhinitis 06/02/2009   Qualifier: Diagnosis of  By: Lovell Sheehan MD, Balinda Quails   . DJD (degenerative joint disease), lumbar  01/18/2012  . Dyslipidemia   . Edema    Bilateral - left lower leg greater than right- wears compression stockings  . General weakness 07/05/2012  . GERD (gastroesophageal reflux disease)   . History of elevated glucose 10/01/2009   Qualifier: Diagnosis of  By: Lovell Sheehan MD, Balinda Quails   . Hx of colonic polyps 11/02/2011  . Hypertension   . INSOMNIA, CHRONIC, MILD 01/02/2008   Qualifier: Diagnosis of  By: Lovell Sheehan MD, Balinda Quails   .  Macular degeneration 08-06-13   bilateral -sight impaired  . Obesity   . Paroxysmal Atrial fibrillation - admitted with RVR 01/18/2012  . S/P left THA, AA 08/13/2013  . Shortness of breath     Past Surgical History:  Procedure Laterality Date  . CARDIOVERSION N/A 07/07/2012   Procedure: CARDIOVERSION;  Surgeon: Chrystie Nose, MD;  Location: [facility] ENDOSCOPY;  Service: Cardiovascular;  Laterality: N/A;  . CARDIOVERSION N/A 08/30/2012   Procedure: CARDIOVERSION;  Surgeon: Chrystie Nose, MD;  Location: [facility] ENDOSCOPY;  Service: Cardiovascular;  Laterality: N/A;  . TOTAL HIP ARTHROPLASTY Left 08/13/2013   Procedure: LEFT TOTAL HIP ARTHROPLASTY ANTERIOR APPROACH;  Surgeon: [name], MD;  Location: [facility] ORS;  Service: Orthopedics;  Laterality: Left;  . TOTAL KNEE ARTHROPLASTY Left 01/24/2017   Procedure: LEFT TOTAL KNEE ARTHROPLASTY;  Surgeon: Durene Romans, MD;  Location: [facility] ORS;  Service: Orthopedics;  Laterality: Left;  90 mins  . TRANSTHORACIC ECHOCARDIOGRAM  07/07/2012   ef 40%; mild MR; LA mod-severely dilated; RA mildly dilated; RV systolic function mildly reduced; RV systolic pressure increase - mod pulm htn  . VASCULAR SURGERY  removed varicose veins  . VASECTOMY       reports that he quit smoking about 52 years ago. He has never used smokeless tobacco. He reports that he does not drink alcohol or use drugs.  Allergies  Allergen Reactions  . Nsaids     Stomach pain  . Vicodin [Hydrocodone-Acetaminophen] Other (See Comments)    Unknown reaction many years ago  . Morphine And Related Nausea And Vomiting    Dizziness, light headed. "Opiates cause tightness in chest".    Family History  Problem Relation Age of Onset  . Alzheimer's disease Mother   . Heart disease Father      Prior to Admission medications   Medication Sig Start Date End Date Taking? Authorizing Provider  acetaminophen (TYLENOL) 500 MG tablet Take 2 tablets (1,000 mg total) by mouth every 8 (eight) hours.  01/24/17  Yes Babish, Molli Hazard, [name]  albuterol (PROVENTIL HFA;VENTOLIN HFA) 108 (90 Base) MCG/ACT inhaler Inhale 2 puffs into the lungs. Every 6 hours as needed for wheezing or shortness of breath   Yes [provider]  amiodarone (PACERONE) 200 MG tablet TAKE ONE TABLET BY MOUTH ONCE DAILY 10/29/16  Yes Hilty, Lisette Abu, MD  bumetanide (BUMEX) 1 MG tablet Take 1 tablet (1 mg total) by mouth 2 (two) times daily. 10/15/16  Yes Hilty, Lisette Abu, MD  Calcium Carbonate-Vitamin D (CALCIUM 600+D) 600-400 MG-UNIT per tablet Take 1 tablet by mouth 3 (three) times a week. Monday Wednesday and Friday   Yes [provider]  cetirizine (ZYRTEC) 10 MG tablet Take 10 mg by mouth daily as needed for allergies.   Yes [provider]  docusate sodium 100 MG CAPS Take 100 mg by mouth 2 (two) times daily. 08/15/13  Yes Babish, Molli Hazard, [name]  doxycycline (ADOXA) 100 MG tablet Take 100 mg by mouth 2 (two) times daily.  Yes [provider]  ferrous sulfate (FERROUSUL) 325 (65 FE) MG tablet Take 1 tablet (325 mg total) by mouth 3 (three) times daily with meals. 01/24/17  Yes Babish, Molli Hazard, [name]  gabapentin (NEURONTIN) 400 MG capsule TAKE 1 CAPSULE BY MOUTH THREE TIMES DAILY 01/14/17  Yes [name], MD  Magnesium 250 MG TABS Take 250 mg by mouth daily.    Yes [provider]  methocarbamol (ROBAXIN) 500 MG tablet Take 1 tablet (500 mg total) by mouth every 6 (six) hours as needed for muscle spasms. 01/24/17  Yes Babish, Molli Hazard, [name]  midodrine (PROAMATINE) 10 MG tablet TAKE ONE TABLET BY MOUTH ONCE DAILY AS NEEDED FOR  SYSTOLIC  BLOOD  PRESSURE  (TOP  NUMBER)  UNDER  90 08/05/16  Yes Hilty, Lisette Abu, MD  Multiple Vitamin (MULTIVITAMIN WITH MINERALS) TABS tablet Take 1 tablet by mouth daily.   Yes [provider]  Multiple Vitamins-Minerals (ICAPS AREDS FORMULA PO) Take 1 capsule by mouth 2 (two) times daily.    Yes [provider]  nystatin (NYSTATIN)  powder Apply 100,000 g topically. Apply topically two times daily as needed to treat any areas of concern for yeast   Yes [provider]  polyethylene glycol (MIRALAX / GLYCOLAX) packet Take 17 g by mouth 2 (two) times daily. 01/24/17  Yes Babish, Molli Hazard, [name]  rivaroxaban (XARELTO) 20 MG TABS tablet Take 1 tablet (20 mg total) by mouth daily. 08/05/15  Yes Hilty, Lisette Abu, MD  rosuvastatin (CRESTOR) 20 MG tablet TAKE ONE TABLET BY MOUTH ONCE A WEEK, FRIDAY 12/24/16  Yes Hilty, Lisette Abu, MD  SPIRIVA HANDIHALER 18 MCG inhalation capsule INHALE ONE PUFF BY MOUTH ONCE DAILY 03/18/16  Yes [name], MD  tamsulosin (FLOMAX) 0.4 MG CAPS capsule TAKE 1 CAPSULE BY MOUTH ONCE DAILY 12/24/16  Yes [name], MD  traMADol (ULTRAM) 50 MG tablet Take 50 mg by mouth. Take one tablet every 6 hours as needed for mild pain. Take 2 tablets every 6 hours as needed for moderate to severe pain.   Yes [provider]  KLOR-CON M20 20 MEQ tablet TAKE ONE TABLET BY MOUTH ONCE DAILY Patient not taking: Reported on 02/07/2017 11/12/16   [name], MD    Physical Exam: Vitals:   02/07/17 2210 02/07/17 2230 02/07/17 2300 02/07/17 2330  BP:  (!) 97/43 (!) 99/46 (!) 102/56  Pulse:  75 87 93  Resp:  20 (!) 30 (!) 31  Temp:      TempSrc:      SpO2: 93% 97% 98% 97%  Weight:      Height:          Constitutional: NAD, calm, in obvious discomfort Eyes: PERTLA, lids and conjunctivae normal ENMT: Mucous membranes are moist. Posterior pharynx clear of any exudate or lesions.   Neck: normal, supple, no masses, no thyromegaly Respiratory: Tachypneic, dyspneic with speech. Fine rales bilaterally. Increased WOB.   Cardiovascular: S1 & S2 heard, regular rate and rhythm. Bilateral pretibial edema to knees.  Abdomen: No distension, no tenderness, no masses palpated. Bowel sounds normal.  Musculoskeletal: no clubbing / cyanosis. No joint deformity upper and lower extremities.   tone.   Skin: no significant rashes, lesions, ulcers. Warm, dry, well-perfused. Neurologic: CN 2-12 grossly intact. Sensation intact. Strength 5/5 in all 4 limbs.  Psychiatric: Alert and oriented to person, place, and situation. In obvious pain.      Labs on Admission: I have personally reviewed following labs  and imaging studies  CBC:  Recent Labs Lab 02/07/17 2227  WBC 15.3*  NEUTROABS 12.9*  HGB 8.9*  HCT 26.0*  MCV 92.2  PLT 371   Basic Metabolic Panel:  Recent Labs Lab 02/07/17 2227  NA 135  K 3.0*  CL 97*  CO2 29  GLUCOSE 144*  BUN 18  CREATININE 1.02  CALCIUM 8.1*   GFR: Estimated Creatinine Clearance: 54.3 mL/min (by C-G formula based on SCr of 1.02 mg/dL). Liver Function Tests:  Recent Labs Lab 02/07/17 2227  AST 66*  ALT 49  ALKPHOS 88  BILITOT 0.3  PROT 5.4*  ALBUMIN 2.3*   No results for input(s): LIPASE, AMYLASE in the last 168 hours. No results for input(s): AMMONIA in the last 168 hours. Coagulation Profile: No results for input(s): INR, PROTIME in the last 168 hours. Cardiac Enzymes:  Recent Labs Lab 02/07/17 2227  TROPONINI 0.03*   BNP (last 3 results) No results for input(s): PROBNP in the last 8760 hours. HbA1C: No results for input(s): HGBA1C in the last 72 hours. CBG: No results for input(s): GLUCAP in the last 168 hours. Lipid Profile: No results for input(s): CHOL, HDL, LDLCALC, TRIG, CHOLHDL, LDLDIRECT in the last 72 hours. Thyroid Function Tests: No results for input(s): TSH, T4TOTAL, FREET4, T3FREE, THYROIDAB in the last 72 hours. Anemia Panel: No results for input(s): VITAMINB12, FOLATE, FERRITIN, TIBC, IRON, RETICCTPCT in the last 72 hours. Urine analysis:    Component Value Date/Time   COLORURINE YELLOW 08/06/2013 1006   APPEARANCEUR CLOUDY (A) 08/06/2013 1006   LABSPEC 1.021 08/06/2013 1006   PHURINE 5.5 08/06/2013 1006   GLUCOSEU NEGATIVE 08/06/2013 1006   HGBUR NEGATIVE 08/06/2013 1006   BILIRUBINUR NEGATIVE  08/06/2013 1006   KETONESUR NEGATIVE 08/06/2013 1006   PROTEINUR NEGATIVE 08/06/2013 1006   UROBILINOGEN 0.2 08/06/2013 1006   NITRITE POSITIVE (A) 08/06/2013 1006   LEUKOCYTESUR LARGE (A) 08/06/2013 1006   Sepsis Labs: @LABRCNTIP (procalcitonin:4,lacticidven:4) )No results found for this or any previous visit (from the past 240 hour(s)).   Radiological Exams on Admission: Dg Pelvis 1-2 Views  Result Date: 02/07/2017 CLINICAL DATA:  Fever.  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 EXAM: PELVIS - 1-2 VIEW COMPARISON:  Pelvis radiograph 07/17/2013 FINDINGS: Left hip arthroplasty in expected alignment. No periprosthetic lucency or fracture. No bony destructive change demonstrated radiographically. There is air in the perineum that may be site of walls are, however is specific. IMPRESSION: Peroneal air is nonspecific and may be secondary to decubitus ulcer. No radiographic evidence of acute osseous abnormality or bony destructive change. Left hip arthroplasty in place. Electronically Signed   By: Rubye Oaks M.D.   On: 02/07/2017 22:21   Dg Chest Port 1 View  Result Date: 02/07/2017 CLINICAL DATA:  Fever. EXAM: PORTABLE CHEST 1 VIEW COMPARISON:  Radiographs 08/06/2013 FINDINGS: Unchanged heart size and mediastinal contours with tortuous atherosclerotic thoracic aorta and mild cardiomegaly. No consolidation to suggest pneumonia. Minimal streaky bibasilar atelectasis. No pulmonary edema, large pleural effusion or pneumothorax. The bones are under mineralized. IMPRESSION: 1. No acute abnormality or explanation for fever. 2. Chronic cardiomegaly and tortuous atherosclerotic thoracic aorta. Electronically Signed   By: Rubye Oaks M.D.   On: 02/07/2017 22:19    EKG: Independently reviewed. Sinus rhythm, PAC's, chronic RBBB and LAFB.    Assessment/Plan  1. Sepsis/septic shock secondary to infected sacral pressure ulcer  - Pt presents from SNF with fever and hypotension; was treated with 1 g APAP  and 500 cc NS PTA  -  Found to be febrile to 39.4, tachypneic, and hypotensive  - Suspected source is grossly infected sacral ulcer  - Blood cultures and wound culture collected in ED, 30 cc/kg NS bolus given, and empiric vancomycin and Zosyn initiated   - MAP ~60 following 30 cc/kg NS bolus  - Pt recently started on midodrine for low BP's, will increase to TID for now  - Hesitant to give more IVF with hx of CHF and increasing WOB after the sepsis fluids  - Discussed the critical nature of this illness with pt's daughter, confirmed his wishes for DNR/DNI status   - Continue empiric abx, continue supportive care, likely needs debridement   2. Paroxysmal atrial fibrillation  - In a sinus rhythm on presentation  - CHADS-VASc is at least 21 (age x2, CHF)  - Plan to continue amiodarone, but hold Xarelto in light of recent Hgb drop and open wound that likely needs surgical debridement    3. Chronic diastolic CHF  - Concerned pt developing acute CHF after the sepsis fluids  - Plan to follow daily wts and I/O's  - Will likely need diuresis once acute condition stabilized    4. Hypokalemia  - Serum potassium is 3.0 on admission  - Treated with 30 mEq IV potassium  - Continue cardiac monitoring, repeat chem panel in am    5. Anemia  - Hgb is 8.9 on admission, down from 10.4 two weeks earlier  - Denies melena or hematochezia  - Likely secondary to chronic disease and recent surgery, bloody drainage from sacral wound  - Type and screen, follow H&H    DVT prophylaxis: SCD's  Code Status: DNR  Family Communication: Daughter updated at bedside Disposition Plan: Admit to SDU Consults called: None Admission status: Inpatient    Briscoe Deutscher, MD [facility] Pager [phone]  If 7PM-7AM, please contact night-coverage [email or web address] Password [facility]  02/08/2017, 12:33 AM

## 2017-02-08 NOTE — Anesthesia Preprocedure Evaluation (Addendum)
Anesthesia Evaluation  Patient identified by MRN, date of birth, ID band Patient awake    Reviewed: Allergy & Precautions, H&P , NPO status , Patient's Chart, lab work & pertinent test results, reviewed documented beta blocker date and time   History of Anesthesia Complications Negative for: history of anesthetic complications  Airway Mallampati: II  TM Distance: >3 FB Neck ROM: Full    Dental  (+) Edentulous Upper, Edentulous Lower   Pulmonary shortness of breath and with exertion, asthma , former smoker,    breath sounds clear to auscultation       Cardiovascular hypertension, Pt. on medications +CHF  + dysrhythmias Atrial Fibrillation  Rhythm:Regular Rate:Normal  NSR, rate 67. Bifasicular block  Normal to mild global reduction in LV function (EF 50); grade 1 diastolic dysfunction; severe LAE; calcified aortic valve with mild AS and trace AI; mild MR; trace TR with mildly elevated pulmonary pressure.   The left ventricular ejection fraction is normal (55-65%). Nuclear stress EF: 64%. There was no ST segment deviation noted during stress. The study is normal. This is a low risk study.   Normal resting and stress perfusion. No ischemia or infarction EF 64%   Neuro/Psych Insomnianegative neurological ROS     GI/Hepatic GERD  Medicated and Controlled,  Endo/Other  diabetesObesity  Renal/GU Renal InsufficiencyRenal disease     Musculoskeletal  (+) Arthritis ,   Abdominal (+) + obese,   Peds  Hematology  (+) anemia ,   Anesthesia Other Findings Dyslipidemia  Reproductive/Obstetrics                             Anesthesia Physical  Anesthesia Plan  ASA: III  Anesthesia Plan: General   Post-op Pain Management:    Induction: Intravenous  PONV Risk Score and Plan: 3 and Ondansetron, Propofol infusion and Treatment may vary due to age or medical condition  Airway Management  Planned: Oral ETT  Additional Equipment: None  Intra-op Plan:   Post-operative Plan: Possible Post-op intubation/ventilation  Informed Consent: I have reviewed the patients History and Physical, chart, labs and discussed the procedure including the risks, benefits and alternatives for the proposed anesthesia with the patient or authorized representative who has indicated his/her understanding and acceptance.   Dental advisory given  Plan Discussed with: CRNA  Anesthesia Plan Comments: (Patient AAO x 3, appears to understand his current state of health. Explained risks associated with anesthesia, including death. Patient understands the need for endotracheal tube to proceed with case, and possibility that this ETT may be needed postoperatively, and wishes to proceed. He also expressed understanding and acceptance of the need for IVF, vasopressor medications, and is accepting of CPR intraoperatively and in the immediate postoperative recovery period in the event that he suffers cardiac arrest. Need for invasive BP monitoring will be determined following induction, per patient discussion.)       Anesthesia Quick Evaluation

## 2017-02-08 NOTE — Consult Note (Signed)
WOC Nurse wound consult note Reason for Consult: Large Unstageable Pressure Injury to coccyx. Patient is s/p a recent knee surgery and currently resides in a Rehab facility. Wound type: Pressure Pressure Injury POA: Yes Measurement:7cm x 4.5cm defect with depth unable to be determined due to the presence of necrotic tissue.   Wound bed: 100% necrotic Drainage (amount, consistency, odor) Small amount of malodorous exudate consistent with necrotic tissue is noted. Periwound: Intact, erythematous in the immediate periwound area to 1cm circumferentially. Dressing procedure/placement/frequency: CCS, PA was nearby in the ICU at the time of my assessment and I invited her in to see the patient as one of my recommendations was going to be for CCS involvement. If patient is able to tolerate surgical debridement, this would be preferable to hydrotherapy and enzymatic debridement given the amount of necrotic tissue. She will consult with Dr. Fredricka Bonine who is on our campus today. In the interim, CCS PA and I agree that we will implement a three times daily saline gauze dressing to the wound and that further orders will be forthcoming from that practice. Thank you for the opportunity to participate in the care of [name] WOC nursing team will not follow, but will remain available to this patient, the nursing and medical teams.  Please re-consult if needed. Thanks, Ladona Mow, MSN, RN, GNP, [facility]  Pager# [phone]

## 2017-02-08 NOTE — Consult Note (Signed)
[facility] Surgery Consult Note  Alexus Michael Sumida [birth year]  [id number].    Requesting MD: Margaretha Glassing Chief Complaint/Reason for Consult: Sacral ulcer  HPI:  [name] is an 83yo male admitted to [facility] last night with sepsis. Patient was brought to the [facility] from a SNF where he is currently staying for rehab following left TKA 01/24/17. States that he has had a sore over his sacrum "for quite some time." Unable to remember exactly how long it has been there. Per chart he had reportedly developed a small red spot over his sacrum that was noted during his recent hospitalization earlier this month for knee surgery. Since that time he states that the wound has gotten much larger and more painful. The pain is constant and severe. Worse with lying flat. It has developed black tissue and drainage. States that 2 nights ago he started having fevers and chills. Denies nausea and vomiting. Seen by [facility] RN who recommended a surgical consult. Last meal was yesterday.  PMH significant for Atrial fibrillation, Chronic diastolic CHF (ECHO 07/8935 EF 50-55%), Hypotension on midrin Anticoagulants: Xarelto (last dose 9/24) Nonsmoker  ROS: Review of Systems  Constitutional: Positive for chills and fever.  HENT: Negative.   Eyes: Negative.   Respiratory: Negative.   Cardiovascular: Negative.   Gastrointestinal: Negative.   Genitourinary: Negative.   Musculoskeletal: Negative.   Skin:       Sacral decubitus ulcer  Neurological: Negative.    All systems reviewed and otherwise negative except for as above  Family History  Problem Relation Age of Onset  . Alzheimer's disease Mother   . Heart disease Father     Past Medical History:  Diagnosis Date  . A-fib ([place])   . Acute systolic CHF (congestive heart failure), NYHA class 3 -- Unclear etilogy (? Afib related) EF down from 60-70% to 40%. 07/05/2012   Echo 2/21: LV upper limits of normal. EF- 40%. Cannot assess Diastolic function - Aortic  Sclerosis, Mod-Severe Left Atrial dilation; Mild RV & RA dilation; Moderately elevated PA peak pressure: 33m Hg     . Arthritis    knees  . Asthma   . ASTHMA 02/06/2007   Qualifier: Diagnosis of  By: CRogue BussingCMA, JMaryann Alar   . BENIGN PROSTATIC HYPERTROPHY 05/13/2010   Qualifier: Diagnosis of  By: KBurnice Logan MD, PDoretha Sou  . Bifascicular block 12/18/2013  . BPH (benign prostatic hypertrophy)   . Bronchospasm, exercise-induced   . CHF (congestive heart failure) (HCC)    systolic, EF 434%(22/87/6811  . Chronic kidney disease    stabilized, due to infection  . Chronic rhinitis 06/02/2009   Qualifier: Diagnosis of  By: JArnoldo MoraleMD, JBalinda Quails  . DJD (degenerative joint disease), lumbar 01/18/2012  . Dyslipidemia   . Edema    Bilateral - left lower leg greater than right- wears compression stockings  . General weakness 07/05/2012  . GERD (gastroesophageal reflux disease)   . History of elevated glucose 10/01/2009   Qualifier: Diagnosis of  By: JArnoldo MoraleMD, JBalinda Quails  . Hx of colonic polyps 11/02/2011  . Hypertension   . INSOMNIA, CHRONIC, MILD 01/02/2008   Qualifier: Diagnosis of  By: JArnoldo MoraleMD, JBalinda Quails  . Macular degeneration 08-06-13   bilateral -sight impaired  . Obesity   . Paroxysmal Atrial fibrillation - admitted with RVR 01/18/2012  . S/P left THA, AA 08/13/2013  . Shortness of breath     Past Surgical History:  Procedure Laterality Date  .  CARDIOVERSION N/A 07/07/2012   Procedure: CARDIOVERSION;  Surgeon: Kenneth C. Hilty, MD;  Location: [facility] ENDOSCOPY;  Service: Cardiovascular;  Laterality: N/A;  . CARDIOVERSION N/A 08/30/2012   Procedure: CARDIOVERSION;  Surgeon: Kenneth C. Hilty, MD;  Location: [facility] ENDOSCOPY;  Service: Cardiovascular;  Laterality: N/A;  . TOTAL HIP ARTHROPLASTY Left 08/13/2013   Procedure: LEFT TOTAL HIP ARTHROPLASTY ANTERIOR APPROACH;  Surgeon: Matthew D Olin, MD;  Location: [facility] ORS;  Service: Orthopedics;  Laterality: Left;  . TOTAL KNEE ARTHROPLASTY Left 01/24/2017    Procedure: LEFT TOTAL KNEE ARTHROPLASTY;  Surgeon: Olin, Matthew, MD;  Location: [facility] ORS;  Service: Orthopedics;  Laterality: Left;  90 mins  . TRANSTHORACIC ECHOCARDIOGRAM  07/07/2012   ef 40%; mild MR; LA mod-severely dilated; RA mildly dilated; RV systolic function mildly reduced; RV systolic pressure increase - mod pulm htn  . VASCULAR SURGERY  removed varicose veins  . VASECTOMY      Social History:  reports that he quit smoking about 52 years ago. He has never used smokeless tobacco. He reports that he does not drink alcohol or use drugs.  Allergies:  Allergies  Allergen Reactions  . Nsaids     Stomach pain  . Vicodin [Hydrocodone-Acetaminophen] Other (See Comments)    Unknown reaction many years ago  . Morphine And Related Nausea And Vomiting    Dizziness, light headed. "Opiates cause tightness in chest".    Medications Prior to Admission  Medication Sig Dispense Refill  . acetaminophen (TYLENOL) 500 MG tablet Take 2 tablets (1,000 mg total) by mouth every 8 (eight) hours. 30 tablet 0  . albuterol (PROVENTIL HFA;VENTOLIN HFA) 108 (90 Base) MCG/ACT inhaler Inhale 2 puffs into the lungs. Every 6 hours as needed for wheezing or shortness of breath    . amiodarone (PACERONE) 200 MG tablet TAKE ONE TABLET BY MOUTH ONCE DAILY 90 tablet 3  . bumetanide (BUMEX) 1 MG tablet Take 1 tablet (1 mg total) by mouth 2 (two) times daily. 60 tablet 5  . Calcium Carbonate-Vitamin D (CALCIUM 600+D) 600-400 MG-UNIT per tablet Take 1 tablet by mouth 3 (three) times a week. Monday Wednesday and Friday    . cetirizine (ZYRTEC) 10 MG tablet Take 10 mg by mouth daily as needed for allergies.    . docusate sodium 100 MG CAPS Take 100 mg by mouth 2 (two) times daily. 10 capsule 0  . doxycycline (ADOXA) 100 MG tablet Take 100 mg by mouth 2 (two) times daily.    . ferrous sulfate (FERROUSUL) 325 (65 FE) MG tablet Take 1 tablet (325 mg total) by mouth 3 (three) times daily with meals.  3  . gabapentin  (NEURONTIN) 400 MG capsule TAKE 1 CAPSULE BY MOUTH THREE TIMES DAILY 270 capsule 1  . Magnesium 250 MG TABS Take 250 mg by mouth daily.     . methocarbamol (ROBAXIN) 500 MG tablet Take 1 tablet (500 mg total) by mouth every 6 (six) hours as needed for muscle spasms. 40 tablet 0  . midodrine (PROAMATINE) 10 MG tablet TAKE ONE TABLET BY MOUTH ONCE DAILY AS NEEDED FOR  SYSTOLIC  BLOOD  PRESSURE  (TOP  NUMBER)  UNDER  90 90 tablet 2  . Multiple Vitamin (MULTIVITAMIN WITH MINERALS) TABS tablet Take 1 tablet by mouth daily.    . Multiple Vitamins-Minerals (ICAPS AREDS FORMULA PO) Take 1 capsule by mouth 2 (two) times daily.     . nystatin (NYSTATIN) powder Apply 100,000 g topically. Apply topically two times daily as needed to   treat any areas of concern for yeast    . polyethylene glycol (MIRALAX / GLYCOLAX) packet Take 17 g by mouth 2 (two) times daily. 14 each 0  . rivaroxaban (XARELTO) 20 MG TABS tablet Take 1 tablet (20 mg total) by mouth daily. 21 tablet 0  . rosuvastatin (CRESTOR) 20 MG tablet TAKE ONE TABLET BY MOUTH ONCE A WEEK, FRIDAY 12 tablet 2  . SPIRIVA HANDIHALER 18 MCG inhalation capsule INHALE ONE PUFF BY MOUTH ONCE DAILY 90 capsule 3  . tamsulosin (FLOMAX) 0.4 MG CAPS capsule TAKE 1 CAPSULE BY MOUTH ONCE DAILY 30 capsule 5  . traMADol (ULTRAM) 50 MG tablet Take 50 mg by mouth. Take one tablet every 6 hours as needed for mild pain. Take 2 tablets every 6 hours as needed for moderate to severe pain.    [name] KLOR-CON M20 20 MEQ tablet TAKE ONE TABLET BY MOUTH ONCE DAILY (Patient not taking: Reported on 02/07/2017) 60 tablet 5    Prior to Admission medications   Medication Sig Start Date End Date Taking? Authorizing Provider  acetaminophen (TYLENOL) 500 MG tablet Take 2 tablets (1,000 mg total) by mouth every 8 (eight) hours. 01/24/17  Yes Babish, Rodman Key, [name]  albuterol (PROVENTIL HFA;VENTOLIN HFA) 108 (90 Base) MCG/ACT inhaler Inhale 2 puffs into the lungs. Every 6 hours as needed for  wheezing or shortness of breath   Yes [provider]  amiodarone (PACERONE) 200 MG tablet TAKE ONE TABLET BY MOUTH ONCE DAILY 10/29/16  Yes Hilty, Nadean Corwin, MD  bumetanide (BUMEX) 1 MG tablet Take 1 tablet (1 mg total) by mouth 2 (two) times daily. 10/15/16  Yes Hilty, Nadean Corwin, MD  Calcium Carbonate-Vitamin D (CALCIUM 600+D) 600-400 MG-UNIT per tablet Take 1 tablet by mouth 3 (three) times a week. Monday Wednesday and Friday   Yes [provider]  cetirizine (ZYRTEC) 10 MG tablet Take 10 mg by mouth daily as needed for allergies.   Yes [provider]  docusate sodium 100 MG CAPS Take 100 mg by mouth 2 (two) times daily. 08/15/13  Yes Babish, Rodman Key, [name]  doxycycline (ADOXA) 100 MG tablet Take 100 mg by mouth 2 (two) times daily.   Yes [provider]  ferrous sulfate (FERROUSUL) 325 (65 FE) MG tablet Take 1 tablet (325 mg total) by mouth 3 (three) times daily with meals. 01/24/17  Yes Babish, Rodman Key, [name]  gabapentin (NEURONTIN) 400 MG capsule TAKE 1 CAPSULE BY MOUTH THREE TIMES DAILY 01/14/17  Yes Marletta Lor, MD  Magnesium 250 MG TABS Take 250 mg by mouth daily.    Yes [provider]  methocarbamol (ROBAXIN) 500 MG tablet Take 1 tablet (500 mg total) by mouth every 6 (six) hours as needed for muscle spasms. 01/24/17  Yes Babish, Rodman Key, [name]  midodrine (PROAMATINE) 10 MG tablet TAKE ONE TABLET BY MOUTH ONCE DAILY AS NEEDED FOR  SYSTOLIC  BLOOD  PRESSURE  (TOP  NUMBER)  UNDER  90 08/05/16  Yes Hilty, Nadean Corwin, MD  Multiple Vitamin (MULTIVITAMIN WITH MINERALS) TABS tablet Take 1 tablet by mouth daily.   Yes [provider]  Multiple Vitamins-Minerals (ICAPS AREDS FORMULA PO) Take 1 capsule by mouth 2 (two) times daily.    Yes [provider]  nystatin (NYSTATIN) powder Apply 100,000 g topically. Apply topically two times daily as needed to treat any areas of concern for yeast   Yes [provider]  polyethylene glycol  (MIRALAX / GLYCOLAX) packet Take 17 g by mouth 2 (  two) times daily. 01/24/17  Yes Babish, Rodman Key, [name]  rivaroxaban (XARELTO) 20 MG TABS tablet Take 1 tablet (20 mg total) by mouth daily. 08/05/15  Yes Hilty, Nadean Corwin, MD  rosuvastatin (CRESTOR) 20 MG tablet TAKE ONE TABLET BY MOUTH ONCE A WEEK, FRIDAY 12/24/16  Yes Hilty, Nadean Corwin, MD  SPIRIVA HANDIHALER 18 MCG inhalation capsule INHALE ONE PUFF BY MOUTH ONCE DAILY 03/18/16  Yes Marletta Lor, MD  tamsulosin (FLOMAX) 0.4 MG CAPS capsule TAKE 1 CAPSULE BY MOUTH ONCE DAILY 12/24/16  Yes Marletta Lor, MD  traMADol (ULTRAM) 50 MG tablet Take 50 mg by mouth. Take one tablet every 6 hours as needed for mild pain. Take 2 tablets every 6 hours as needed for moderate to severe pain.   Yes [provider]  KLOR-CON M20 20 MEQ tablet TAKE ONE TABLET BY MOUTH ONCE DAILY Patient not taking: Reported on 02/07/2017 11/12/16   Marletta Lor, MD    Blood pressure (!) 102/39, pulse 76, temperature 98.9 F (37.2 C), temperature source Oral, resp. rate (!) 24, height 5' 6" (1.676 m), weight 204 lb (92.5 kg), SpO2 100 %. Physical Exam: General: pleasant, WD/WN white male who is laying in bed in NAD HEENT: head is normocephalic, atraumatic.  Sclera are noninjected.  Pupils equal and round.  Ears and nose without any masses or lesions.  Mouth is pink and moist. Dentition fair Heart: irregularly irregular.  No obvious murmurs, gallops, or rubs noted.  Palpable pedal pulses bilaterally Lungs: CTAB, no wheezes, rhonchi, or rales noted.  Respiratory effort nonlabored on nasal canula Abd: soft, NT/ND, +BS, no masses, hernias, or organomegaly MS: all 4 extremities are symmetrical with no cyanosis, clubbing, or edema. Well healed anterior left knee incision Skin: warm and dry with no masses, lesions, or rashes Psych: A&Ox3 with an appropriate affect. Neuro: cranial nerves grossly intact, extremity CSM intact bilaterally, normal speech Sacrum:  about 7x5cm ulcer over sacrum with necrotic tissue, scant purulent drainage, and foul odor with significant surrounding erythema       Results for orders placed or performed during the hospital encounter of 02/07/17 (from the past 48 hour(s))  Urinalysis, Routine w reflex microscopic     Status: Abnormal   Collection Time: 02/07/17  9:58 PM  Result Value Ref Range   Color, Urine YELLOW YELLOW   APPearance HAZY (A) CLEAR   Specific Gravity, Urine 1.010 1.005 - 1.030   pH 5.0 5.0 - 8.0   Glucose, UA NEGATIVE NEGATIVE mg/dL   Hgb urine dipstick NEGATIVE NEGATIVE   Bilirubin Urine NEGATIVE NEGATIVE   Ketones, ur NEGATIVE NEGATIVE mg/dL   Protein, ur NEGATIVE NEGATIVE mg/dL   Nitrite NEGATIVE NEGATIVE   Leukocytes, UA SMALL (A) NEGATIVE   RBC / HPF 0-5 0 - 5 RBC/hpf   WBC, UA 6-30 0 - 5 WBC/hpf   Bacteria, UA RARE (A) NONE SEEN   Squamous Epithelial / LPF 0-5 (A) NONE SEEN   Mucus PRESENT    Hyaline Casts, UA PRESENT   Wound or Superficial Culture     Status: None (Preliminary result)   Collection Time: 02/07/17 10:04 PM  Result Value Ref Range   Specimen Description BACK    Special Requests NONE    Gram Stain      FEW WBC PRESENT, PREDOMINANTLY PMN MODERATE GRAM NEGATIVE RODS MODERATE GRAM POSITIVE COCCI FEW GRAM POSITIVE RODS Performed at [facility], [address], [place], [place] [address]    Culture PENDING  Report Status PENDING   Blood gas, arterial ([facility], [facility], [facility])     Status: Abnormal   Collection Time: 02/07/17 10:20 PM  Result Value Ref Range   O2 Content 2.5 L/min   Delivery systems NASAL CANNULA    pH, Arterial 7.428 7.350 - 7.450   pCO2 arterial 46.2 32.0 - 48.0 mmHg   pO2, Arterial 135 (H) 83.0 - 108.0 mmHg   Bicarbonate 29.1 (H) 20.0 - 28.0 mmol/L   Acid-Base Excess 5.3 (H) 0.0 - 2.0 mmol/L   O2 Saturation 98.4 %   Patient temperature 102.9    Collection site RIGHT RADIAL    Drawn by [id number]    Sample type ARTERIAL DRAW    Allens test  (pass/fail) PASS PASS  Comprehensive metabolic panel     Status: Abnormal   Collection Time: 02/07/17 10:27 PM  Result Value Ref Range   Sodium 135 135 - 145 mmol/L   Potassium 3.0 (L) 3.5 - 5.1 mmol/L   Chloride 97 (L) 101 - 111 mmol/L   CO2 29 22 - 32 mmol/L   Glucose, Bld 144 (H) 65 - 99 mg/dL   BUN 18 6 - 20 mg/dL   Creatinine, Ser 1.02 0.61 - 1.24 mg/dL   Calcium 8.1 (L) 8.9 - 10.3 mg/dL   Total Protein 5.4 (L) 6.5 - 8.1 g/dL   Albumin 2.3 (L) 3.5 - 5.0 g/dL   AST 66 (H) 15 - 41 U/L   ALT 49 17 - 63 U/L   Alkaline Phosphatase 88 38 - 126 U/L   Total Bilirubin 0.3 0.3 - 1.2 mg/dL   GFR calc non Af Amer >60 >60 mL/min   GFR calc Af Amer >60 >60 mL/min    Comment: (NOTE) The eGFR has been calculated using the CKD EPI equation. This calculation has not been validated in all clinical situations. eGFR's persistently <60 mL/min signify possible Chronic Kidney Disease.    Anion gap 9 5 - 15  CBC WITH DIFFERENTIAL     Status: Abnormal   Collection Time: 02/07/17 10:27 PM  Result Value Ref Range   WBC 15.3 (H) 4.0 - 10.5 K/uL   RBC 2.82 (L) 4.22 - 5.81 MIL/uL   Hemoglobin 8.9 (L) 13.0 - 17.0 g/dL   HCT 26.0 (L) 39.0 - 52.0 %   MCV 92.2 78.0 - 100.0 fL   MCH 31.6 26.0 - 34.0 pg   MCHC 34.2 30.0 - 36.0 g/dL   RDW 13.8 11.5 - 15.5 %   Platelets 371 150 - 400 K/uL   Neutrophils Relative % 84 %   Neutro Abs 12.9 (H) 1.7 - 7.7 K/uL   Lymphocytes Relative 5 %   Lymphs Abs 0.7 0.7 - 4.0 K/uL   Monocytes Relative 11 %   Monocytes Absolute 1.7 (H) 0.1 - 1.0 K/uL   Eosinophils Relative 0 %   Eosinophils Absolute 0.0 0.0 - 0.7 K/uL   Basophils Relative 0 %   Basophils Absolute 0.0 0.0 - 0.1 K/uL  Troponin I     Status: Abnormal   Collection Time: 02/07/17 10:27 PM  Result Value Ref Range   Troponin I 0.03 (HH) <0.03 ng/mL    Comment: CRITICAL RESULT CALLED TO, READ BACK BY AND VERIFIED WITH: LINDSEY COLES,RN [id number] @ 2312 BY J SCOTTON   Procalcitonin     Status: None    Collection Time: 02/07/17 10:27 PM  Result Value Ref Range   Procalcitonin 0.41 ng/mL    Comment:          Interpretation: PCT (Procalcitonin) <= 0.5 ng/mL: Systemic infection (sepsis) is not likely. Local bacterial infection is possible. (NOTE)         ICU PCT Algorithm               Non ICU PCT Algorithm    ----------------------------     ------------------------------         PCT < 0.25 ng/mL                 PCT < 0.1 ng/mL     Stopping of antibiotics            Stopping of antibiotics       strongly encouraged.               strongly encouraged.    ----------------------------     ------------------------------       PCT level decrease by               PCT < 0.25 ng/mL       >= 80% from peak PCT       OR PCT 0.25 - 0.5 ng/mL          Stopping of antibiotics                                             encouraged.     Stopping of antibiotics           encouraged.    ----------------------------     ------------------------------       PCT level decrease by              PCT >= 0.25 ng/mL       < 80% from peak PCT        AND PCT >= 0.5 ng/mL            Continuin g antibiotics                                              encouraged.       Continuing antibiotics            encouraged.    ----------------------------     ------------------------------     PCT level increase compared          PCT > 0.5 ng/mL         with peak PCT AND          PCT >= 0.5 ng/mL             Escalation of antibiotics                                          strongly encouraged.      Escalation of antibiotics        strongly encouraged.   Protime-INR     Status: Abnormal   Collection Time: 02/07/17 10:27 PM  Result Value Ref Range   Prothrombin Time 21.6 (H) 11.4 - 15.2 seconds   INR 1.89   APTT     Status: Abnormal   Collection Time: 02/07/17 10:27 PM  Result Value Ref Range   aPTT 47 (H) 24 -   36 seconds    Comment:        IF BASELINE aPTT IS ELEVATED, SUGGEST PATIENT RISK ASSESSMENT BE USED  TO DETERMINE APPROPRIATE ANTICOAGULANT THERAPY.   I-Stat CG4 Lactic Acid, ED  (not at  [facility])     Status: None   Collection Time: 02/07/17 10:35 PM  Result Value Ref Range   Lactic Acid, Venous 0.82 0.5 - 1.9 mmol/L  Lactic acid, plasma     Status: None   Collection Time: 02/08/17  1:05 AM  Result Value Ref Range   Lactic Acid, Venous 0.6 0.5 - 1.9 mmol/L  Type and screen [place]     Status: None   Collection Time: 02/08/17  1:05 AM  Result Value Ref Range   ABO/RH(D) A NEG    Antibody Screen NEG    Sample Expiration 02/11/2017   CBG monitoring, ED     Status: Abnormal   Collection Time: 02/08/17  3:30 AM  Result Value Ref Range   Glucose-Capillary 131 (H) 65 - 99 mg/dL  MRSA PCR Screening     Status: None   Collection Time: 02/08/17  5:29 AM  Result Value Ref Range   MRSA by PCR NEGATIVE NEGATIVE    Comment:        The GeneXpert MRSA Assay (FDA approved for NASAL specimens only), is one component of a comprehensive MRSA colonization surveillance program. It is not intended to diagnose MRSA infection nor to guide or monitor treatment for MRSA infections.   Basic metabolic panel     Status: Abnormal   Collection Time: 02/08/17  7:17 AM  Result Value Ref Range   Sodium 139 135 - 145 mmol/L   Potassium 3.1 (L) 3.5 - 5.1 mmol/L   Chloride 103 101 - 111 mmol/L   CO2 29 22 - 32 mmol/L   Glucose, Bld 110 (H) 65 - 99 mg/dL   BUN 15 6 - 20 mg/dL   Creatinine, Ser 0.89 0.61 - 1.24 mg/dL   Calcium 7.8 (L) 8.9 - 10.3 mg/dL   GFR calc non Af Amer >60 >60 mL/min   GFR calc Af Amer >60 >60 mL/min    Comment: (NOTE) The eGFR has been calculated using the CKD EPI equation. This calculation has not been validated in all clinical situations. eGFR's persistently <60 mL/min signify possible Chronic Kidney Disease.    Anion gap 7 5 - 15  CBC WITH DIFFERENTIAL     Status: Abnormal   Collection Time: 02/08/17  7:17 AM  Result Value Ref Range   WBC 16.0 (H) 4.0  - 10.5 K/uL   RBC 2.60 (L) 4.22 - 5.81 MIL/uL   Hemoglobin 8.3 (L) 13.0 - 17.0 g/dL   HCT 24.6 (L) 39.0 - 52.0 %   MCV 94.6 78.0 - 100.0 fL   MCH 31.9 26.0 - 34.0 pg   MCHC 33.7 30.0 - 36.0 g/dL   RDW 14.1 11.5 - 15.5 %   Platelets 391 150 - 400 K/uL   Neutrophils Relative % 83 %   Neutro Abs 13.2 (H) 1.7 - 7.7 K/uL   Lymphocytes Relative 9 %   Lymphs Abs 1.4 0.7 - 4.0 K/uL   Monocytes Relative 8 %   Monocytes Absolute 1.3 (H) 0.1 - 1.0 K/uL   Eosinophils Relative 0 %   Eosinophils Absolute 0.0 0.0 - 0.7 K/uL   Basophils Relative 0 %   Basophils Absolute 0.0 0.0 - 0.1 K/uL  Troponin I (q 6hr x 3)  Status: None   Collection Time: 02/08/17  7:17 AM  Result Value Ref Range   Troponin I <0.03 <0.03 ng/mL  Glucose, capillary     Status: Abnormal   Collection Time: 02/08/17  7:57 AM  Result Value Ref Range   Glucose-Capillary 100 (H) 65 - 99 mg/dL   Dg Pelvis 1-2 Views  Result Date: 02/07/2017 CLINICAL DATA:  Fever.  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 EXAM: PELVIS - 1-2 VIEW COMPARISON:  Pelvis radiograph 07/17/2013 FINDINGS: Left hip arthroplasty in expected alignment. No periprosthetic lucency or fracture. No bony destructive change demonstrated radiographically. There is air in the perineum that may be site of walls are, however is specific. IMPRESSION: Peroneal air is nonspecific and may be secondary to decubitus ulcer. No radiographic evidence of acute osseous abnormality or bony destructive change. Left hip arthroplasty in place. Electronically Signed   By: Melanie  Ehinger M.D.   On: 02/07/2017 22:21   Dg Chest Port 1 View  Result Date: 02/07/2017 CLINICAL DATA:  Fever. EXAM: PORTABLE CHEST 1 VIEW COMPARISON:  Radiographs 08/06/2013 FINDINGS: Unchanged heart size and mediastinal contours with tortuous atherosclerotic thoracic aorta and mild cardiomegaly. No consolidation to suggest pneumonia. Minimal streaky bibasilar atelectasis. No pulmonary edema, large pleural effusion or  pneumothorax. The bones are under mineralized. IMPRESSION: 1. No acute abnormality or explanation for fever. 2. Chronic cardiomegaly and tortuous atherosclerotic thoracic aorta. Electronically Signed   By: Melanie  Ehinger M.D.   On: 02/07/2017 22:19    Anti-infectives    Start     Dose/Rate Route Frequency Ordered Stop   02/08/17 1000  vancomycin (VANCOCIN) IVPB 750 mg/150 ml premix     750 mg 150 mL/hr over 60 Minutes Intravenous Every 12 hours 02/08/17 0504     02/08/17 0600  piperacillin-tazobactam (ZOSYN) IVPB 3.375 g     3.375 g 12.5 mL/hr over 240 Minutes Intravenous Every 8 hours 02/08/17 0504     02/07/17 2200  piperacillin-tazobactam (ZOSYN) IVPB 3.375 g     3.375 g 100 mL/hr over 30 Minutes Intravenous  Once 02/07/17 2159 02/07/17 2323   02/07/17 2200  vancomycin (VANCOCIN) IVPB 1000 mg/200 mL premix     1,000 mg 200 mL/hr over 60 Minutes Intravenous  Once 02/07/17 2159 02/08/17 0035       Assessment/Plan Atrial fibrillation on Xarelto (last dose 9/24) Chronic diastolic CHF (ECHO 08/2015 EF 50-55%) Hypotension on midrin S/p left TKA 01/24/17 Code status DNR  Sepsis Sacral decubitus ulcer - gradually worsening ulcer present at least since recent hospitalization earlier this month for left TKA - Found to be febrile to 102.9, tachypneic, and hypotensive  - Blood cultures and wound culture collected in ED, started on empiric vancomycin and Zosyn  ID - zosyn/vancomycin 9/24>> VTE - SCDs, hold xarelto FEN - IVF, NPO Foley - none Follow up - TBD  Plan - Plan to take to OR today for debridement of sacral decubitus ulcer. Please keep patient NPO. Continue IV antibiotics.  [name], [name] [facility] 02/08/2017, 9:38 AM Pager: [phone] Consults: [phone] Mon-Fri 7:00 am-4:30 pm Sat-Sun 7:00 am-11:30 am  

## 2017-02-08 NOTE — Progress Notes (Signed)
Pharmacy Antibiotic Note  [name] is a 83 y.o. male admitted on 02/07/2017 with cellulitis, sacral ulcer with foul odor and drainage.  Pharmacy has been consulted for Vancomycin and Zosyn dosing.  Plan:Vancomycin 1gm iv x1, then 750mg  iv q12hr; Goal trough 15-20 mcg/mL. Zosyn 3.375gm iv x1, then Zosyn 3.375g IV Q8H infused over 4hrs.   Height: 5\' 6"  (167.6 cm) Weight: 204 lb (92.5 kg) IBW/kg (Calculated) : 63.8  Temp (24hrs), Avg:102.9 F (39.4 C), Min:102.9 F (39.4 C), Max:102.9 F (39.4 C)   Recent Labs Lab 02/07/17 2227 02/07/17 2235 02/08/17 0105  WBC 15.3*  --   --   CREATININE 1.02  --   --   LATICACIDVEN  --  0.82 0.6    Estimated Creatinine Clearance: 54.3 mL/min (by C-G formula based on SCr of 1.02 mg/dL).    Allergies  Allergen Reactions  . Nsaids     Stomach pain  . Vicodin [Hydrocodone-Acetaminophen] Other (See Comments)    Unknown reaction many years ago  . Morphine And Related Nausea And Vomiting    Dizziness, light headed. "Opiates cause tightness in chest".    Antimicrobials this admission: Vancomycin 02/07/2017 >> Zosyn 02/07/2017 >>   Dose adjustments this admission: -  Microbiology results: pending  Thank you for allowing pharmacy to be a part of this patient's care.  Aleene Davidson Crowford 02/08/2017 5:05 AM

## 2017-02-08 NOTE — Anesthesia Procedure Notes (Signed)
Procedure Name: Intubation Date/Time: 02/08/2017 3:32 PM Performed by: Talbot Grumbling Pre-anesthesia Checklist: Patient identified, Emergency Drugs available, Suction available and Patient being monitored Patient Re-evaluated:Patient Re-evaluated prior to induction Oxygen Delivery Method: Circle system utilized Preoxygenation: Pre-oxygenation with 100% oxygen Induction Type: IV induction Ventilation: Mask ventilation without difficulty Laryngoscope Size: Mac and 4 Grade View: Grade I Tube type: Oral Tube size: 8.0 mm Number of attempts: 1 Airway Equipment and Method: Stylet

## 2017-02-08 NOTE — Care Management Note (Signed)
Case Management Note  Patient Details  Name: [name] MRN: [id number] Date of Birth: [birth year]  Subjective/Objective:                   83 y.o. male with medical history significant for paroxysmal atrial fibrillation on Xarelto, chronic diastolic CHF, hypotension recently started on Midrin, and osteoarthritis status post total knee replacement 2 weeks ago, now presenting to the emergency department from his SNF for evaluation of fevers, low blood pressure, and severe pain from a sacral ulcer. Patient is accompanied by his daughter who assists with the history. He had reportedly developed a small red spot on his sacrum that was noted during his recent hospitalization for knee surgery. Since his discharge to a skilled nursing facility, there is been skin breakdown and an expanding ulcer. Family reports noting that the ulcer has developed some black tissue, drainage, and without foul odor. Patient has been febrile at his nursing facility and was treated with acetaminophen. He had a low blood pressure, was given 500 mL normal saline  Action/Plan: February 08, 2017 Marcelle Smiling, BSN, [place],  [place]    [phone] Chart reviewed for case management and discharge needs. Next review date: [id number]  Expected Discharge Date:                  Expected Discharge Plan:  Home/Self Care  In-House Referral:     Discharge planning Services  CM Consult  Post Acute Care Choice:    Choice offered to:     DME Arranged:    DME Agency:     HH Arranged:    HH Agency:     Status of Service:  In process, will continue to follow  If discussed at Long Length of Stay Meetings, dates discussed:    Additional Comments:  Golda Acre, RN 02/08/2017, 9:04 AM

## 2017-02-08 NOTE — Transfer of Care (Signed)
Immediate Anesthesia Transfer of Care Note  Patient: [name]  Procedure(s) Performed: Procedure(s): IRRIGATION AND DEBRIDEMENT SACRAL ULCER (N/A)  Patient Location: PACU  Anesthesia Type:General  Level of Consciousness: awake, alert  and oriented  Airway & Oxygen Therapy: Patient Spontanous Breathing and Patient connected to face mask oxygen  Post-op Assessment: Report given to RN and Post -op Vital signs reviewed and stable  Post vital signs: Reviewed and stable  Last Vitals:  Vitals:   02/08/17 1515 02/08/17 1516  BP:  (!) 99/48  Pulse: 65 66  Resp: (!) 25   Temp:    SpO2: 96% 96%    Last Pain:  Vitals:   02/08/17 1200  TempSrc: Oral         Complications: No apparent anesthesia complications

## 2017-02-09 ENCOUNTER — Encounter (HOSPITAL_COMMUNITY): Payer: Self-pay | Admitting: Surgery

## 2017-02-09 LAB — BLOOD CULTURE ID PANEL (REFLEXED)
ACINETOBACTER BAUMANNII: NOT DETECTED
CANDIDA ALBICANS: NOT DETECTED
CANDIDA PARAPSILOSIS: NOT DETECTED
CANDIDA TROPICALIS: NOT DETECTED
Candida glabrata: NOT DETECTED
Candida krusei: NOT DETECTED
ENTEROCOCCUS SPECIES: NOT DETECTED
ESCHERICHIA COLI: NOT DETECTED
Enterobacter cloacae complex: NOT DETECTED
Enterobacteriaceae species: NOT DETECTED
HAEMOPHILUS INFLUENZAE: NOT DETECTED
KLEBSIELLA OXYTOCA: NOT DETECTED
KLEBSIELLA PNEUMONIAE: NOT DETECTED
LISTERIA MONOCYTOGENES: NOT DETECTED
Neisseria meningitidis: NOT DETECTED
Proteus species: NOT DETECTED
Pseudomonas aeruginosa: NOT DETECTED
SERRATIA MARCESCENS: NOT DETECTED
STAPHYLOCOCCUS AUREUS BCID: NOT DETECTED
STREPTOCOCCUS PYOGENES: NOT DETECTED
Staphylococcus species: NOT DETECTED
Streptococcus agalactiae: NOT DETECTED
Streptococcus pneumoniae: NOT DETECTED
Streptococcus species: NOT DETECTED

## 2017-02-09 LAB — CBC WITH DIFFERENTIAL/PLATELET
Basophils Absolute: 0 10*3/uL (ref 0.0–0.1)
Basophils Relative: 0 %
EOS ABS: 0 10*3/uL (ref 0.0–0.7)
EOS PCT: 0 %
HCT: 26.2 % — ABNORMAL LOW (ref 39.0–52.0)
HEMOGLOBIN: 8.9 g/dL — AB (ref 13.0–17.0)
Lymphocytes Relative: 6 %
Lymphs Abs: 1 10*3/uL (ref 0.7–4.0)
MCH: 31.7 pg (ref 26.0–34.0)
MCHC: 34 g/dL (ref 30.0–36.0)
MCV: 93.2 fL (ref 78.0–100.0)
MONO ABS: 0.9 10*3/uL (ref 0.1–1.0)
MONOS PCT: 6 %
Neutro Abs: 14.2 10*3/uL — ABNORMAL HIGH (ref 1.7–7.7)
Neutrophils Relative %: 88 %
PLATELETS: 468 10*3/uL — AB (ref 150–400)
RBC: 2.81 MIL/uL — ABNORMAL LOW (ref 4.22–5.81)
RDW: 14.3 % (ref 11.5–15.5)
WBC: 16.1 10*3/uL — ABNORMAL HIGH (ref 4.0–10.5)

## 2017-02-09 LAB — BASIC METABOLIC PANEL
Anion gap: 11 (ref 5–15)
BUN: 14 mg/dL (ref 6–20)
CHLORIDE: 102 mmol/L (ref 101–111)
CO2: 26 mmol/L (ref 22–32)
CREATININE: 0.67 mg/dL (ref 0.61–1.24)
Calcium: 8.2 mg/dL — ABNORMAL LOW (ref 8.9–10.3)
GFR calc Af Amer: >60 mL/min (ref >60)
GFR calc non Af Amer: >60 mL/min (ref >60)
Glucose, Bld: 102 mg/dL — ABNORMAL HIGH (ref 65–99)
Potassium: 3.9 mmol/L (ref 3.5–5.1)
SODIUM: 139 mmol/L (ref 135–145)

## 2017-02-09 LAB — HEPATIC FUNCTION PANEL
ALT: 38 U/L (ref 17–63)
AST: 33 U/L (ref 15–41)
Albumin: 2.2 g/dL — ABNORMAL LOW (ref 3.5–5.0)
Alkaline Phosphatase: 90 U/L (ref 38–126)
BILIRUBIN DIRECT: 0.2 mg/dL (ref 0.1–0.5)
BILIRUBIN INDIRECT: 0.5 mg/dL (ref 0.3–0.9)
Total Bilirubin: 0.7 mg/dL (ref 0.3–1.2)
Total Protein: 5.4 g/dL — ABNORMAL LOW (ref 6.5–8.1)

## 2017-02-09 LAB — MAGNESIUM: MAGNESIUM: 2.1 mg/dL (ref 1.7–2.4)

## 2017-02-09 LAB — GLUCOSE, CAPILLARY
GLUCOSE-CAPILLARY: 140 mg/dL — AB (ref 65–99)
GLUCOSE-CAPILLARY: 153 mg/dL — AB (ref 65–99)
GLUCOSE-CAPILLARY: 184 mg/dL — AB (ref 65–99)
Glucose-Capillary: 102 mg/dL — ABNORMAL HIGH (ref 65–99)

## 2017-02-09 LAB — URINE CULTURE: Culture: <10000 — AB

## 2017-02-09 MED ORDER — DAKINS (1/4 STRENGTH) 0.125 % EX SOLN
Freq: Two times a day (BID) | CUTANEOUS | Status: DC
  Administered 2017-02-09 (×2)
  Filled 2017-02-09: qty 473

## 2017-02-09 MED ORDER — ENSURE ENLIVE PO LIQD
237.0000 mL | ORAL | Status: DC
  Administered 2017-02-10 – 2017-02-13 (×4): 237 mL via ORAL

## 2017-02-09 MED ORDER — PREMIER PROTEIN SHAKE
11.0000 [oz_av] | ORAL | Status: DC
  Administered 2017-02-10 – 2017-02-20 (×11): 11 [oz_av] via ORAL
  Filled 2017-02-09 (×14): qty 325.31

## 2017-02-09 MED ORDER — ZOLPIDEM TARTRATE 5 MG PO TABS
5.0000 mg | ORAL_TABLET | Freq: Once | ORAL | Status: AC
  Administered 2017-02-09: 5 mg via ORAL
  Filled 2017-02-09: qty 1

## 2017-02-09 MED ORDER — HYDROCORTISONE NA SUCCINATE PF 100 MG IJ SOLR
50.0000 mg | Freq: Three times a day (TID) | INTRAMUSCULAR | Status: DC
  Administered 2017-02-09: 50 mg via INTRAVENOUS
  Filled 2017-02-09: qty 2

## 2017-02-09 NOTE — Progress Notes (Signed)
   02/09/17 1515  Hydrotherapy Evaluation  [phone]  Subjective Assessment  Subjective How will this get better?  Patient and Family Stated Goals to walk   Date of Onset (present on admission)  Prior Treatments repositioning  Evaluation and Treatment  Evaluation and Treatment Procedures Explained to Patient/Family Yes  Evaluation and Treatment Procedures agreed to  Wound / Incision (Open or Dehisced) 02/09/17 Other (Comment) Sacrum Medial lareg openn  nstage 4 wound, post surgical debridement.  Date First Assessed/Time First Assessed: 02/09/17 1448   Wound Type: Other (Comment)  Location: Sacrum  Location Orientation: Medial  Wound Description (Comments): lareg openn  nstage 4 wound, post surgical debridement.  Dressing Type Gauze (Comment);Moist to dry (Dakins)  Dressing Changed New  Dressing Status Clean  Dressing Change Frequency Daily  Site / Wound Assessment Black  % Wound base Red or Granulating 5%  % Wound base Yellow/Fibrinous Exudate 90%  % Wound base Other/Granulation Tissue (Comment) 5%  Peri-wound Assessment Excoriated;Erythema (blanchable);Maceration  Wound Length (cm) 10 cm  Wound Width (cm) 10 cm  Wound Depth (cm) 4 cm  Wound Volume (cm^3) 400 cm^3  Wound Surface Area (cm^2) 100 cm^2  Tunneling (cm) 9 (at 6:00, 4 cm @3 :00)  Undermining (cm) 4 (9-)  Margins Unattached edges (unapproximated)  Drainage Amount Moderate  Drainage Description Sanguineous;Purulent  Non-staged Wound Description Full thickness  Treatment Hydrotherapy (Pulse lavage) (packing w/Dakins)  Hydrotherapy  Pulsed lavage therapy - wound location sacrum  Pulsed Lavage with Suction (psi) 8 psi  Pulsed Lavage with Suction - Normal Saline Used 1000 mL  Pulsed Lavage Tip Tip with splash shield  Wound Therapy - Assess/Plan/Recommendations  Wound Therapy - Clinical Statement The patient presents with a large and painful wound that was debrided surgically 02/08/17. The patient is s/p LTKA on 01/24/17,  discharged to [facility] 9/14. The patient will benefit from PT for PLS to facilitate wound healing.   Wound Therapy - Functional Problem List significantly impaired mobility  pre and post LTKA.  Factors Delaying/Impairing Wound Healing Immobility;Multiple medical problems  Hydrotherapy Plan Debridement;Dressing change;Patient/family education;Pulsatile lavage with suction  Wound Therapy - Frequency 6X / week  Wound Therapy - Current Recommendations PT  Wound Therapy - Follow Up Recommendations Skilled nursing facility  Wound Plan PLS  and dressing chnages, debridement  Wound Therapy Goals - Improve the function of patient's integumentary system by progressing the wound(s) through the phases of wound healing by:  Decrease Necrotic Tissue to 50  Decrease Necrotic Tissue - Progress Goal set today  Increase Granulation Tissue to 50  Increase Granulation Tissue - Progress Goal set today  Improve Drainage Characteristics Min  Improve Drainage Characteristics - Progress Goal set today  Patient/Family will be able to  repowition off of the sacrum  Patient/Family Instruction Goal - Progress Goal set today  Time For Goal Achievement 2 weeks  Wound Therapy - Potential for Goals [name] PT [phone]

## 2017-02-09 NOTE — Progress Notes (Signed)
Initial Nutrition Assessment  DOCUMENTATION CODES:   Obesity unspecified  INTERVENTION:  - Will order Ensure Enlive once/day, this supplement provides 350 kcal and 20 grams of protein - Will order Premier Protein once/day, this supplement provides 160 kcal and 30 grams of protein.  - Continue to encourage PO intakes of meals and supplements. - RD will monitor for additional nutrition-related needs.  NUTRITION DIAGNOSIS:   Increased nutrient needs related to wound healing as evidenced by estimated needs.  GOAL:   Patient will meet greater than or equal to 90% of their needs  MONITOR:   PO intake, Supplement acceptance, Weight trends, Labs, Skin  REASON FOR ASSESSMENT:   Low Braden  ASSESSMENT:   83 y.o. male with medical history significant for paroxysmal a. fib, chronic diastolic CHF, hypotension, and osteoarthritis status post TKA on 01/24/17. Presented to the ED from SNF for evaluation of fevers, low blood pressure, and severe pain from a sacral ulcer. Patient is accompanied by his daughter who assists with the history. He had reportedly developed a small red spot on his sacrum that was noted during his recent hospitalization for knee surgery. Since his discharge to a SNF, there is been skin breakdown and an expanding ulcer. Family reports noting that the ulcer has developed some black tissue, drainage, and without foul odor. Patient has been febrile at his nursing facility.  Pt seen for low Braden. BMI indicates obesity. Pt was made NPO yesterday AM for I&D of pressure injury. Diet advanced to CLD yesterday evening and then to Heart Healthy this AM. Pt with good appetite at baseline/PTA. Increased protein needs d/t wound and surgical intervention; will order supplements as outlined above. No chewing or swallowing issues noted.   Physical assessment shows no muscle and no fat wasting, mild edema to LUE and LLE. Per chart review, some weight fluctuation over the past 9 months  (203-216 lbs since 05/28/16). Current weight is the highest and lowest weight was on the day of TKA. Will monitor weight trends closely.   Medications reviewed; 1 tablet Oscal-D on MWF, 100 mg Colace BID, 20 mg IV Pepcid BID, 50 mg Solu-cortef TID, sliding scale Novolog, 400 mg Mag-ox/day, 1 tablet Prosight BID, 1 packet Miralax BID, 20 mEq oral KCl BID. Labs reviewed; CBGs: 102 and 140 mg/dL today, Ca: 8.2 mg/dL.   Diet Order:  Diet Heart Room service appropriate? Yes; Fluid consistency: Thin  Skin:   Unstageable coccyx pressure injury  Last BM:  9/25  Height:   Ht Readings from Last 1 Encounters:  02/07/17 5\' 6"  (1.676 m)    Weight:   Wt Readings from Last 1 Encounters:  02/09/17 216 lb 11.4 oz (98.3 kg)    Ideal Body Weight:  64.54 kg  BMI:  Body mass index is 34.98 kg/m.  Estimated Nutritional Needs:   Kcal:  1770-1965 (18-20 kcal/kg)  Protein:  100-118 grams (1-1.2 grams/kg)  Fluid:  >/= 2 L/day  EDUCATION NEEDS:   No education needs identified at this time     [name] Gammon, MS, RD, LDN, CNSC Inpatient Clinical Dietitian Pager # [phone] After hours/weekend pager # [phone]

## 2017-02-09 NOTE — Progress Notes (Addendum)
1 Day Post-Op   Subjective/Chief Complaint: No acute issues overnight. No pain as long as he isn't moving.    Objective: Vital signs in last 24 hours: Temp:  [97.7 F (36.5 C)-98.9 F (37.2 C)] 97.9 F (36.6 C) (09/26 0313) Pulse Rate:  [61-75] 75 (09/26 0500) Resp:  [17-29] 29 (09/26 0500) BP: (94-128)/(33-51) 128/46 (09/26 0500) SpO2:  [94 %-100 %] 94 % (09/26 0500) Weight:  [98.3 kg (216 lb 11.4 oz)] 98.3 kg (216 lb 11.4 oz) (09/26 0546) Last BM Date: 02/08/17  Intake/Output from previous day: 09/25 0701 - 09/26 0700 In: 850 [I.V.:500; IV Piggyback:350] Out: 600 [Urine:550; Blood:50] Intake/Output this shift: No intake/output data recorded.  General appearance: alert and cooperative Resp: unlabored GI: soft, non-tender; bowel sounds normal; no masses,  no organomegaly  Lab Results:   Recent Labs  02/08/17 0717 02/09/17 0330  WBC 16.0* 16.1*  HGB 8.3* 8.9*  HCT 24.6* 26.2*  PLT 391 468*   BMET  Recent Labs  02/08/17 0717 02/09/17 0330  NA 139 139  K 3.1* 3.9  CL 103 102  CO2 29 26  GLUCOSE 110* 102*  BUN 15 14  CREATININE 0.89 0.67  CALCIUM 7.8* 8.2*   PT/INR  Recent Labs  02/07/17 2227  LABPROT 21.6*  INR 1.89   ABG  Recent Labs  02/07/17 2220  PHART 7.428  HCO3 29.1*    Studies/Results: Dg Pelvis 1-2 Views  Result Date: 02/07/2017 CLINICAL DATA:  Fever.  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 EXAM: PELVIS - 1-2 VIEW COMPARISON:  Pelvis radiograph 07/17/2013 FINDINGS: Left hip arthroplasty in expected alignment. No periprosthetic lucency or fracture. No bony destructive change demonstrated radiographically. There is air in the perineum that may be site of walls are, however is specific. IMPRESSION: Peroneal air is nonspecific and may be secondary to decubitus ulcer. No radiographic evidence of acute osseous abnormality or bony destructive change. Left hip arthroplasty in place. Electronically Signed   By: Rubye Oaks M.D.   On:  02/07/2017 22:21   Dg Chest Port 1 View  Result Date: 02/07/2017 CLINICAL DATA:  Fever. EXAM: PORTABLE CHEST 1 VIEW COMPARISON:  Radiographs 08/06/2013 FINDINGS: Unchanged heart size and mediastinal contours with tortuous atherosclerotic thoracic aorta and mild cardiomegaly. No consolidation to suggest pneumonia. Minimal streaky bibasilar atelectasis. No pulmonary edema, large pleural effusion or pneumothorax. The bones are under mineralized. IMPRESSION: 1. No acute abnormality or explanation for fever. 2. Chronic cardiomegaly and tortuous atherosclerotic thoracic aorta. Electronically Signed   By: Rubye Oaks M.D.   On: 02/07/2017 22:19    Anti-infectives: Anti-infectives    Start     Dose/Rate Route Frequency Ordered Stop   02/08/17 1000  vancomycin (VANCOCIN) IVPB 750 mg/150 ml premix  Status:  Discontinued     750 mg 150 mL/hr over 60 Minutes Intravenous Every 12 hours 02/08/17 0504 02/08/17 1914   02/08/17 0600  piperacillin-tazobactam (ZOSYN) IVPB 3.375 g     3.375 g 12.5 mL/hr over 240 Minutes Intravenous Every 8 hours 02/08/17 0504     02/07/17 2200  piperacillin-tazobactam (ZOSYN) IVPB 3.375 g     3.375 g 100 mL/hr over 30 Minutes Intravenous  Once 02/07/17 2159 02/07/17 2323   02/07/17 2200  vancomycin (VANCOCIN) IVPB 1000 mg/200 mL premix     1,000 mg 200 mL/hr over 60 Minutes Intravenous  Once 02/07/17 2159 02/08/17 0035      Assessment/Plan: s/p Procedure(s): IRRIGATION AND DEBRIDEMENT SACRAL ULCER (N/A) 9/25 Dr. Fredricka Bonine  Start wet to  dry dressing changes, hydrotherapy today. Please page PA to assess wound at time of dressing change.   From my standpoint can advance diet as tolerated- will defer to primary team  LOS: 2 days    Berna Bue 02/09/2017

## 2017-02-09 NOTE — Progress Notes (Addendum)
PROGRESS NOTE  [name] SHF:[id number] DOB: 01-Dec-1926 DOA: 02/07/2017   PCP: [name], MD  HPI/Recap of past 24 hours: Pt reports feeling better, still with sacral pain.  Assessment/Plan:  1. Sepsis/septic shock secondary to infected sacral pressure ulcer  - received hydration initially , but due to h/o diastolic chf, he is not on ivf, home diuretic held since admission - pt is no s/w I&D of sacral ulcer, stage IV (with purulent and necrotic debris , post op day #1 - no pulmonary edema on CXR - started on stress dose steroids due to hypotension  - blood pressure stable this AM - keep on Zosyn for now - prelim blood cultures with g+ rods, ? Contaminant, will follow up on final report  2. Paroxysmal atrial fibrillation  - In a sinus rhythm on presentation  - CHADS-VASc is at least 49 (age x2, CHF)  - cont Amiodarone - Xarelto held due to low Hg and need for I&D - plan to resume Xarelto if surgery team OK   3. Chronic diastolic CHF  -  he received hydration initially , but due to h/o diastolic chf, he is not on continuous ivf, home diuretic held since admission, currently no lower extremity edema, cxr no pulmonary edema - monitor daily weights: [facility] Weights   02/07/17 2159 02/09/17 0546  Weight: 92.5 kg (204 lb) 98.3 kg (216 lb 11.4 oz)   4. Hypokalemia  - WNL this AM   5. Anemia , normocytic - Hg overall stable, no signs of active bleeding - CBC in AM  recent left knee surgery: discharged to snf on 9/14  DVT prophylaxis: SCD's  Code Status: DNR   Family Communication: daughter at bedside  Disposition Plan: transfer to tele   Consultants:  general surgery  Procedures:  9/25 OR for wound debridment  Antibiotics:  vanc from admission to 9/25  Zosyn from admission   Objective: BP (!) 128/46   Pulse 75   Temp 98.8 F (37.1 C) (Oral)   Resp (!) 29   Ht 5\' 6"  (1.676 m)   Wt 98.3 kg (216 lb 11.4 oz)   SpO2 94%   BMI 34.98 kg/m    Intake/Output Summary (Last 24 hours) at 02/09/17 1230 Last data filed at 02/08/17 2339  Gross per 24 hour  Intake              800 ml  Output              600 ml  Net              200 ml   Filed Weights   02/07/17 2159 02/09/17 0546  Weight: 92.5 kg (204 lb) 98.3 kg (216 lb 11.4 oz)   Physical Exam  Constitutional: Appears calm, NAD CVS: RRR, S1/S2 +, no murmurs, no gallops, no carotid bruit.  Pulmonary: Effort and breath sounds diminished Abdominal: Soft. BS +,  no distension, tenderness, rebound or guarding.  Psychiatric: Normal mood and affect. Behavior, judgment, thought content normal.   Data Reviewed: Basic Metabolic Panel:  Recent Labs Lab 02/07/17 2227 02/08/17 0717 02/09/17 0330  NA 135 139 139  K 3.0* 3.1* 3.9  CL 97* 103 102  CO2 29 29 26   GLUCOSE 144* 110* 102*  BUN 18 15 14   CREATININE 1.02 0.89 0.67  CALCIUM 8.1* 7.8* 8.2*  MG  --   --  2.1   Liver Function Tests:  Recent Labs Lab 02/07/17 2227 02/09/17 0330  AST  66* 33  ALT 49 38  ALKPHOS 88 90  BILITOT 0.3 0.7  PROT 5.4* 5.4*  ALBUMIN 2.3* 2.2*   CBC:  Recent Labs Lab 02/07/17 2227 02/08/17 0717 02/09/17 0330  WBC 15.3* 16.0* 16.1*  NEUTROABS 12.9* 13.2* 14.2*  HGB 8.9* 8.3* 8.9*  HCT 26.0* 24.6* 26.2*  MCV 92.2 94.6 93.2  PLT 371 391 468*   Cardiac Enzymes:    Recent Labs Lab 02/07/17 2227 02/08/17 0717 02/08/17 0950 02/08/17 1818  TROPONINI 0.03* <0.03 <0.03 0.03*   BNP (last 3 results)  Recent Labs  04/26/16 1128  BNP 89.3   CBG:  Recent Labs Lab 02/08/17 1157 02/08/17 1516 02/08/17 2115 02/09/17 0738 02/09/17 1157  GLUCAP 105* 123* 101* 102* 140*    Recent Results (from the past 240 hour(s))  Urine culture     Status: Abnormal   Collection Time: 02/07/17  9:59 PM  Result Value Ref Range Status   Specimen Description URINE, CLEAN CATCH  Final   Special Requests NONE  Final   Culture (A)  Final    <10,000 COLONIES/mL INSIGNIFICANT  GROWTH Performed at [facility], [address] [place], [place] [address]    Report Status 02/09/2017 FINAL  Final  Wound or Superficial Culture     Status: None (Preliminary result)   Collection Time: 02/07/17 10:04 PM  Result Value Ref Range Status   Specimen Description BACK  Final   Special Requests NONE  Final   Gram Stain   Final    FEW WBC PRESENT, PREDOMINANTLY PMN MODERATE GRAM NEGATIVE RODS MODERATE GRAM POSITIVE COCCI FEW GRAM POSITIVE RODS    Culture   Final    CULTURE REINCUBATED FOR BETTER GROWTH Performed at [facility], [address], [place], [place] [address]    Report Status PENDING  Incomplete  Blood Culture (routine x 2)     Status: None (Preliminary result)   Collection Time: 02/07/17 10:20 PM  Result Value Ref Range Status   Specimen Description BLOOD LEFT FOREARM  Final   Special Requests   Final    BOTTLES DRAWN AEROBIC AND ANAEROBIC Blood Culture adequate volume   Culture  Setup Time   Final    GRAM POSITIVE RODS ANAEROBIC BOTTLE ONLY Organism ID to follow CRITICAL RESULT CALLED TO, READ BACK BY AND VERIFIED WITH: J. LEGGE PHARMD, AT 1008 02/09/17 BY D. VANHOOK Performed at [facility], [address] [place], [place] [address]    Culture GRAM POSITIVE RODS  Final   Report Status PENDING  Incomplete  Blood Culture ID Panel (Reflexed)     Status: None   Collection Time: 02/07/17 10:20 PM  Result Value Ref Range Status   Enterococcus species NOT DETECTED NOT DETECTED Final   Listeria monocytogenes NOT DETECTED NOT DETECTED Final   Staphylococcus species NOT DETECTED NOT DETECTED Final   Staphylococcus aureus NOT DETECTED NOT DETECTED Final   Streptococcus species NOT DETECTED NOT DETECTED Final   Streptococcus agalactiae NOT DETECTED NOT DETECTED Final   Streptococcus pneumoniae NOT DETECTED NOT DETECTED Final   Streptococcus pyogenes NOT DETECTED NOT DETECTED Final   Acinetobacter baumannii NOT DETECTED NOT DETECTED Final    Enterobacteriaceae species NOT DETECTED NOT DETECTED Final   Enterobacter cloacae complex NOT DETECTED NOT DETECTED Final   Escherichia coli NOT DETECTED NOT DETECTED Final   Klebsiella oxytoca NOT DETECTED NOT DETECTED Final   Klebsiella pneumoniae NOT DETECTED NOT DETECTED Final   Proteus species NOT DETECTED  NOT DETECTED Final   Serratia marcescens NOT DETECTED NOT DETECTED Final   Haemophilus influenzae NOT DETECTED NOT DETECTED Final   Neisseria meningitidis NOT DETECTED NOT DETECTED Final   Pseudomonas aeruginosa NOT DETECTED NOT DETECTED Final   Candida albicans NOT DETECTED NOT DETECTED Final   Candida glabrata NOT DETECTED NOT DETECTED Final   Candida krusei NOT DETECTED NOT DETECTED Final   Candida parapsilosis NOT DETECTED NOT DETECTED Final   Candida tropicalis NOT DETECTED NOT DETECTED Final    Comment: Performed at [facility], [address] [place], [place] [address]  MRSA PCR Screening     Status: None   Collection Time: 02/08/17  5:29 AM  Result Value Ref Range Status   MRSA by PCR NEGATIVE NEGATIVE Final    Studies: No results found.  Scheduled Meds: . amiodarone  200 mg Oral Daily  . calcium-vitamin D  1 tablet Oral Once per day on Mon Wed Fri  . docusate sodium  100 mg Oral BID  . gabapentin  400 mg Oral TID  . hydrocortisone sod succinate (SOLU-CORTEF) inj  50 mg Intravenous Q6H  . insulin aspart  0-5 Units Subcutaneous QHS  . insulin aspart  0-9 Units Subcutaneous TID WC  . magnesium oxide  400 mg Oral Daily  . midodrine  10 mg Oral TID WC  . multivitamin  1 tablet Oral BID  . polyethylene glycol  17 g Oral BID  . potassium chloride SA  20 mEq Oral BID  . sodium chloride flush  3 mL Intravenous Q12H  . sodium chloride flush  3 mL Intravenous Q12H  . sodium hypochlorite   Irrigation BID  . tamsulosin  0.4 mg Oral Daily  . tiotropium  18 mcg Inhalation Daily    Continuous Infusions: . sodium chloride    . famotidine (PEPCID) IV Stopped  (02/09/17 0931)  . piperacillin-tazobactam (ZOSYN)  IV Stopped (02/09/17 0950)    Time spent: 25 minutes   Debbora Presto MD [facility] Pager [phone]. If 7PM-7AM, please contact night-coverage at [email or web address], password [facility] 02/09/2017, 12:30 PM  LOS: 2 days

## 2017-02-09 NOTE — Progress Notes (Signed)
PHARMACY - PHYSICIAN COMMUNICATION CRITICAL VALUE ALERT - BLOOD CULTURE IDENTIFICATION (BCID)  Results for orders placed or performed during the hospital encounter of 02/07/17  Blood Culture ID Panel (Reflexed) (Collected: 02/07/2017 10:20 PM)  Result Value Ref Range   Enterococcus species NOT DETECTED NOT DETECTED   Listeria monocytogenes NOT DETECTED NOT DETECTED   Staphylococcus species NOT DETECTED NOT DETECTED   Staphylococcus aureus NOT DETECTED NOT DETECTED   Streptococcus species NOT DETECTED NOT DETECTED   Streptococcus agalactiae NOT DETECTED NOT DETECTED   Streptococcus pneumoniae NOT DETECTED NOT DETECTED   Streptococcus pyogenes NOT DETECTED NOT DETECTED   Acinetobacter baumannii NOT DETECTED NOT DETECTED   Enterobacteriaceae species NOT DETECTED NOT DETECTED   Enterobacter cloacae complex NOT DETECTED NOT DETECTED   Escherichia coli NOT DETECTED NOT DETECTED   Klebsiella oxytoca NOT DETECTED NOT DETECTED   Klebsiella pneumoniae NOT DETECTED NOT DETECTED   Proteus species NOT DETECTED NOT DETECTED   Serratia marcescens NOT DETECTED NOT DETECTED   Haemophilus influenzae NOT DETECTED NOT DETECTED   Neisseria meningitidis NOT DETECTED NOT DETECTED   Pseudomonas aeruginosa NOT DETECTED NOT DETECTED   Candida albicans NOT DETECTED NOT DETECTED   Candida glabrata NOT DETECTED NOT DETECTED   Candida krusei NOT DETECTED NOT DETECTED   Candida parapsilosis NOT DETECTED NOT DETECTED   Candida tropicalis NOT DETECTED NOT DETECTED    Name of physician (or Provider) Contacted: Dr. Izola Price  Changes to prescribed antibiotics required: none  Clance Boll 02/09/2017  10:33 AM

## 2017-02-09 NOTE — Progress Notes (Signed)
CRITICAL VALUE ALERT  Critical Value: troponin 0.03 Date & Time Notied:  02/08/17 @ 1919  Provider Notified:K. Clearence Ped, NP.  Orders Received/Actions taken: No new orders/call received.

## 2017-02-09 NOTE — Progress Notes (Signed)
Patient ID: [name], male   DOB: 08/27/1926, 83 y.o.   MRN: [id number]    Dressing changed and new dakins dampened kerlex packed into wound, ABD pad applied over top. Hydrotherapy has been ordered. Continue BID dressing changes.  [name]

## 2017-02-10 LAB — AEROBIC CULTURE W GRAM STAIN (SUPERFICIAL SPECIMEN)

## 2017-02-10 LAB — GLUCOSE, CAPILLARY
GLUCOSE-CAPILLARY: 115 mg/dL — AB (ref 65–99)
GLUCOSE-CAPILLARY: 137 mg/dL — AB (ref 65–99)
Glucose-Capillary: 117 mg/dL — ABNORMAL HIGH (ref 65–99)
Glucose-Capillary: 129 mg/dL — ABNORMAL HIGH (ref 65–99)

## 2017-02-10 LAB — CBC
HEMATOCRIT: 26.2 % — AB (ref 39.0–52.0)
Hemoglobin: 8.6 g/dL — ABNORMAL LOW (ref 13.0–17.0)
MCH: 31 pg (ref 26.0–34.0)
MCHC: 32.8 g/dL (ref 30.0–36.0)
MCV: 94.6 fL (ref 78.0–100.0)
Platelets: 559 10*3/uL — ABNORMAL HIGH (ref 150–400)
RBC: 2.77 MIL/uL — AB (ref 4.22–5.81)
RDW: 14.3 % (ref 11.5–15.5)
WBC: 19.4 10*3/uL — AB (ref 4.0–10.5)

## 2017-02-10 LAB — AEROBIC CULTURE  (SUPERFICIAL SPECIMEN)

## 2017-02-10 LAB — BASIC METABOLIC PANEL
ANION GAP: 8 (ref 5–15)
BUN: 17 mg/dL (ref 6–20)
CO2: 30 mmol/L (ref 22–32)
Calcium: 8.6 mg/dL — ABNORMAL LOW (ref 8.9–10.3)
Chloride: 102 mmol/L (ref 101–111)
Creatinine, Ser: 0.65 mg/dL (ref 0.61–1.24)
GFR calc Af Amer: >60 mL/min (ref >60)
GFR calc non Af Amer: >60 mL/min (ref >60)
GLUCOSE: 131 mg/dL — AB (ref 65–99)
POTASSIUM: 3.3 mmol/L — AB (ref 3.5–5.1)
Sodium: 140 mmol/L (ref 135–145)

## 2017-02-10 MED ORDER — DAKINS (1/4 STRENGTH) 0.125 % EX SOLN
Freq: Two times a day (BID) | CUTANEOUS | Status: AC
  Administered 2017-02-10 – 2017-02-11 (×4)
  Filled 2017-02-10: qty 473

## 2017-02-10 MED ORDER — RIVAROXABAN 20 MG PO TABS
20.0000 mg | ORAL_TABLET | Freq: Every day | ORAL | Status: DC
  Administered 2017-02-10 – 2017-02-20 (×11): 20 mg via ORAL
  Filled 2017-02-10 (×11): qty 1

## 2017-02-10 MED ORDER — VANCOMYCIN HCL IN DEXTROSE 1-5 GM/200ML-% IV SOLN
1000.0000 mg | INTRAVENOUS | Status: AC
  Administered 2017-02-10: 1000 mg via INTRAVENOUS
  Filled 2017-02-10: qty 200

## 2017-02-10 MED ORDER — VANCOMYCIN HCL IN DEXTROSE 750-5 MG/150ML-% IV SOLN
750.0000 mg | Freq: Two times a day (BID) | INTRAVENOUS | Status: DC
  Administered 2017-02-11 – 2017-02-17 (×13): 750 mg via INTRAVENOUS
  Filled 2017-02-10 (×13): qty 150

## 2017-02-10 NOTE — Evaluation (Signed)
Physical Therapy Evaluation Patient Details Name: [name] MRN: [id number] DOB: [birth year] Today's Date: 02/10/2017   History of Present Illness  83 yo male admitted 02/07/17 with sacral wound, s/p debridement. patient with recent [name] , DC'd to SNF 01/28/17.  Clinical Impression  The patient is well known to this writer from last admission for TKA. The patient did stand and take a few pivotal steps to [facility] and back  To bed with 2 assist. 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  Pt admitted with above diagnosis. Pt currently with functional limitations due to the deficits listed below (see PT Problem List). Pt will benefit from skilled PT to increase their independence and safety with mobility to allow discharge to the venue listed below.       Follow Up Recommendations  SNF    Equipment Recommendations    none   Recommendations for Other Services       Precautions / Restrictions Precautions Precautions: Knee;Fall      Mobility  Bed Mobility Overal bed mobility: Needs Assistance Bed Mobility: Supine to Sit;Sit to Supine;Rolling Rolling: Min assist   Supine to sit: Max assist;+2 for physical assistance;+2 for safety/equipment Sit to supine: Max assist;+2 for safety/equipment;+2 for physical assistance   General bed mobility comments: self assisting with rolling and use of rail. requires assist with legs and trunk and  scooting to bed edge. assist trunk and legs back onto bed. repositioned on right side  Transfers Overall transfer level: Needs assistance Equipment used: Rolling walker (2 wheeled) Transfers: Sit to/from [employer] Sit to Stand: Max assist;+2 physical assistance;+2 safety/equipment;From elevated surface Stand pivot transfers: +2 physical assistance;+2 safety/equipment;Max assist       General transfer comment: assist to power up to stand from bed x 2, slow pivot steps to [facility] then pivot steps back to the  bed. Right knee noted to be flexed, Left knee actually very supportive.  Ambulation/Gait                Stairs            Wheelchair Mobility    Modified Rankin (Stroke Patients Only)       Balance Overall balance assessment: Needs assistance Sitting-balance support: Bilateral upper extremity supported;Feet supported Sitting balance-Leahy Scale: Fair     Standing balance support: During functional activity;Bilateral upper extremity supported Standing balance-Leahy Scale: Poor                               Pertinent Vitals/Pain Pain Score: 8  Pain Location: sacral area Pain Descriptors / Indicators: Discomfort;Grimacing;Guarding Pain Intervention(s): Repositioned    Home Living Family/patient expects to be discharged to:: Skilled nursing facility                      Prior Function                 Hand Dominance        Extremity/Trunk Assessment   Upper Extremity Assessment Upper Extremity Assessment: Generalized weakness    Lower Extremity Assessment Lower Extremity Assessment: LLE deficits/detail;RLE deficits/detail RLE Deficits / Details: significant DJD deformity-varus, does not fully extend knee LLE Deficits / Details: knee flexion 80*, able to  SLR-grossly 4/5 knee extension    Cervical / Trunk Assessment Cervical / Trunk Assessment: Kyphotic  Communication      Cognition Arousal/Alertness: Awake/alert Behavior During Therapy:  WFL for tasks assessed/performed Overall Cognitive Status: Impaired/Different from baseline Area of Impairment: Orientation                 Orientation Level: Time                    General Comments      Exercises Total Joint Exercises Long Arc Quad: AROM;Left;15 reps;Seated Goniometric ROM: 10-60 degrees.   Assessment/Plan    PT Assessment Patient needs continued PT services  PT Problem List Decreased strength;Decreased range of motion;Decreased activity  tolerance;Pain;Decreased knowledge of use of DME;Decreased mobility       PT Treatment Interventions DME instruction;Gait training;Functional mobility training;Therapeutic activities;Therapeutic exercise;Patient/family education    PT Goals (Current goals can be found in the Care Plan section)  Acute Rehab PT Goals Patient Stated Goal: to walk, not have pain PT Goal Formulation: With patient Time For Goal Achievement: 02/24/17 Potential to Achieve Goals: Fair    Frequency Min 3X/week   Barriers to discharge        Co-evaluation               AM-PAC PT "6 Clicks" Daily Activity  Outcome Measure Difficulty turning over in bed (including adjusting bedclothes, sheets and blankets)?: Unable Difficulty moving from lying on back to sitting on the side of the bed? : Unable Difficulty sitting down on and standing up from a chair with arms (e.g., wheelchair, bedside commode, etc,.)?: Unable Help needed moving to and from a bed to chair (including a wheelchair)?: Total Help needed walking in hospital room?: Total Help needed climbing 3-5 steps with a railing? : Total 6 Click Score: 6    End of Session Equipment Utilized During Treatment: Gait belt Activity Tolerance: Patient tolerated treatment well Patient left: in bed;with call bell/phone within reach;with bed alarm set;with nursing/sitter in room Nurse Communication: Mobility status PT Visit Diagnosis: Difficulty in walking, not elsewhere classified (R26.2);Pain;Muscle weakness (generalized) (M62.81) Pain - part of body:  (sacrum)    Time: 1610-9604 PT Time Calculation (min) (ACUTE ONLY): 36 min   Charges:   PT Evaluation $PT Eval Moderate Complexity: 1 Mod PT Treatments $Therapeutic Activity: 8-22 mins   PT G CodesBlanchard Kelch PT [phone]   [name] 02/10/2017, 9:36 AM

## 2017-02-10 NOTE — Progress Notes (Signed)
   02/10/17 1600 Hydro therapy note  Subjective Assessment  Subjective how does it look  Patient and Family Stated Goals to walk   Date of Onset (prsent on admission)  Prior Treatments repositioning  Evaluation and Treatment  Evaluation and Treatment Procedures Explained to Patient/Family Yes  Evaluation and Treatment Procedures agreed to  Wound / Incision (Open or Dehisced) 02/09/17 Other (Comment) Sacrum Medial lareg openn  nstage 4 wound, post surgical debridement.  Date First Assessed/Time First Assessed: 02/09/17 1448   Wound Type: Other (Comment)  Location: Sacrum  Location Orientation: Medial  Wound Description (Comments): lareg openn  nstage 4 wound, post surgical debridement.  Dressing Type Gauze (Comment);Moist to dry (Dakins)  Dressing Changed New  Dressing Status Clean  Dressing Change Frequency PRN  Site / Wound Assessment Brown;Pink  % Wound base Red or Granulating 25%  % Wound base Yellow/Fibrinous Exudate 70%  % Wound base Other/Granulation Tissue (Comment) 5% (bone)  Peri-wound Assessment Excoriated;Erythema (blanchable);Maceration  Wound Length (cm) 7 cm  Wound Width (cm) 5 cm  Wound Depth (cm) 4 cm  Wound Volume (cm^3) 140 cm^3  Wound Surface Area (cm^2) 35 cm^2  Tunneling (cm) 7 (at 6:0, 4 cm @ 3:00)  Undermining (cm) 4 (4-7 arouind the wound)  Margins Unattached edges (unapproximated)  Drainage Amount Moderate  Drainage Description Sanguineous;Purulent  Non-staged Wound Description Full thickness  Hydrotherapy  Pulsed lavage therapy - wound location sacrum  Pulsed Lavage with Suction (psi) 8 psi  Pulsed Lavage with Suction - Normal Saline Used 1000 mL  Pulsed Lavage Tip Tip with splash shield  Wound Therapy - Assess/Plan/Recommendations  Wound Therapy - Clinical Statement The wound  has increase in granulation  and decreased in slough. The tissue  parallel to  left gluteal fold is hard, red, tender with tunneling beneath  this area. . Yellow purulent  drainge is noted in this tunneling .   Wound Therapy - Functional Problem List significantly impaired mobility  pre and post LTKA.  Factors Delaying/Impairing Wound Healing Immobility;Multiple medical problems  Hydrotherapy Plan Debridement;Dressing change;Patient/family education;Pulsatile lavage with suction  Wound Therapy - Frequency 6X / week  Wound Therapy - Current Recommendations PT  Wound Therapy - Follow Up Recommendations Skilled nursing facility  Wound Plan PLS  and dressing chnages, debridement  Wound Therapy Goals - Improve the function of patient's integumentary system by progressing the wound(s) through the phases of wound healing by:  Decrease Necrotic Tissue to 50  Decrease Necrotic Tissue - Progress Progressing toward goal  Increase Granulation Tissue to 50  Increase Granulation Tissue - Progress Progressing toward goal  Improve Drainage Characteristics Min  Improve Drainage Characteristics - Progress Progressing toward goal  Patient/Family will be able to  reposition off of the sacrum  Goals/treatment plan/discharge plan were made with and agreed upon by patient/family Yes  Time For Goal Achievement 2 weeks  Wound Therapy - Potential for Goals [name] PT [phone]

## 2017-02-10 NOTE — Progress Notes (Signed)
Pharmacy Antibiotic Note  [name] is a 83 y.o. male admitted on 02/07/2017 with cellulitis, sacral ulcer with purulent drainage and necrotic eschar.  Pharmacy was initially consulted for Vancomycin and Zosyn dosing.  Antibiotics were narrowed to Zosyn alone on 9/25, however, now pharmacy is consulted to resume Vancomycin dosing for broader coverage due to increasing WBC and possible osteo.  S/p debridement on 9/25.  Today, 02/10/2017:  WBC increased to 19.4  Tm 99.2  SCr 0.65, CrCl ~ 72 ml/min  Plan:  Continue Zosyn 3.375g IV Q8H infused over 4hrs.  Vancomycin 1g IV x1 then 750 mg IV q12h  Measure Vanc trough at steady state.  Daily SCr   Follow up renal fxn, culture results, and clinical course.  Height: 5\' 6"  (167.6 cm) Weight: 220 lb 3.8 oz (99.9 kg) IBW/kg (Calculated) : 63.8  Temp (24hrs), Avg:98.5 F (36.9 C), Min:98.1 F (36.7 C), Max:99.2 F (37.3 C)   Recent Labs Lab 02/07/17 2227 02/07/17 2235 02/08/17 0105 02/08/17 0717 02/09/17 0330 02/10/17 0552  WBC 15.3*  --   --  16.0* 16.1* 19.4*  CREATININE 1.02  --   --  0.89 0.67 0.65  LATICACIDVEN  --  0.82 0.6  --   --   --     Estimated Creatinine Clearance: 72 mL/min (by C-G formula based on SCr of 0.65 mg/dL).    Allergies  Allergen Reactions  . Nsaids     Stomach pain  . Vicodin [Hydrocodone-Acetaminophen] Other (See Comments)    Unknown reaction many years ago  . Morphine And Related Nausea And Vomiting    Dizziness, light headed. "Opiates cause tightness in chest".    Antimicrobials this admission: 9/24 Vancomycin >> 9/25, resume >> 9/24 Zosyn >>  Dose adjustments this admission:   Microbiology results: 9/24 BCx x2: 1/2 GPR (BCID neg) 9/24 UCx: insignificant growth 9/24 back wound: moderate GPC + GNR; few GPR 9/25 MRSA PCR: neg  Thank you for allowing pharmacy to be a part of this patient's care.  Lynann Beaver PharmD, BCPS Pager [phone] 02/10/2017 6:41 PM

## 2017-02-10 NOTE — Progress Notes (Signed)
Patient ID: [name], male   DOB: 1926-06-15, 83 y.o.   MRN: [id number]  [facility] Surgery Progress Note  2 Days Post-Op  Subjective: CC- sacral decubitus ulcer No complaints this morning. Tolerating hydrotherapy yesterday. States that his wound hurts with movement, but currently he has no pain. WBC up today 19.4 from 16.1.  Objective: Vital signs in last 24 hours: Temp:  [98.1 F (36.7 C)-99.2 F (37.3 C)] 99.2 F (37.3 C) (09/27 0647) Pulse Rate:  [73-83] 77 (09/27 0647) Resp:  [16-32] 24 (09/27 0647) BP: (102-127)/(36-67) 124/60 (09/27 0647) SpO2:  [91 %-95 %] 94 % (09/27 0647) Weight:  [220 lb 3.8 oz (99.9 kg)] 220 lb 3.8 oz (99.9 kg) (09/26 1847) Last BM Date: 02/08/17  Intake/Output from previous day: 09/26 0701 - 09/27 0700 In: 420 [I.V.:120; IV Piggyback:300] Out: 650 [Urine:650] Intake/Output this shift: Total I/O In: 477 [P.O.:477] Out: -   PE: Gen:  Alert, NAD, pleasant HEENT: EOM's intact, pupils equal and round Pulm:  effort normal Psych: A&Ox3  Sacrum: dressing in place  Lab Results:   Recent Labs  02/09/17 0330 02/10/17 0552  WBC 16.1* 19.4*  HGB 8.9* 8.6*  HCT 26.2* 26.2*  PLT 468* 559*   BMET  Recent Labs  02/09/17 0330 02/10/17 0552  NA 139 140  K 3.9 3.3*  CL 102 102  CO2 26 30  GLUCOSE 102* 131*  BUN 14 17  CREATININE 0.67 0.65  CALCIUM 8.2* 8.6*   PT/INR  Recent Labs  02/07/17 2227  LABPROT 21.6*  INR 1.89   CMP     Component Value Date/Time   NA 140 02/10/2017 0552   K 3.3 (L) 02/10/2017 0552   CL 102 02/10/2017 0552   CO2 30 02/10/2017 0552   GLUCOSE 131 (H) 02/10/2017 0552   BUN 17 02/10/2017 0552   CREATININE 0.65 02/10/2017 0552   CREATININE 1.11 04/26/2016 1128   CALCIUM 8.6 (L) 02/10/2017 0552   PROT 5.4 (L) 02/09/2017 0330   ALBUMIN 2.2 (L) 02/09/2017 0330   AST 33 02/09/2017 0330   ALT 38 02/09/2017 0330   ALKPHOS 90 02/09/2017 0330   BILITOT 0.7 02/09/2017 0330   GFRNONAA >60  02/10/2017 0552   GFRAA >60 02/10/2017 0552   Lipase     Component Value Date/Time   LIPASE 23.0 12/30/2011 1000       Studies/Results: No results found.  Anti-infectives: Anti-infectives    Start     Dose/Rate Route Frequency Ordered Stop   02/08/17 1000  vancomycin (VANCOCIN) IVPB 750 mg/150 ml premix  Status:  Discontinued     750 mg 150 mL/hr over 60 Minutes Intravenous Every 12 hours 02/08/17 0504 02/08/17 1914   02/08/17 0600  piperacillin-tazobactam (ZOSYN) IVPB 3.375 g     3.375 g 12.5 mL/hr over 240 Minutes Intravenous Every 8 hours 02/08/17 0504     02/07/17 2200  piperacillin-tazobactam (ZOSYN) IVPB 3.375 g     3.375 g 100 mL/hr over 30 Minutes Intravenous  Once 02/07/17 2159 02/07/17 2323   02/07/17 2200  vancomycin (VANCOCIN) IVPB 1000 mg/200 mL premix     1,000 mg 200 mL/hr over 60 Minutes Intravenous  Once 02/07/17 2159 02/08/17 0035       Assessment/Plan PAF - on xarelto Chronic diastolic CHF (ECHO 08/2015 EF 50-55%) Hypotension on midrin S/p left TKA 01/24/17 Code status DNR  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 Fredricka Bonine - POD 2 - undergoing hydrotherapy - WBC up to 19.4, TMAX 99.2  ID - zosyn 9/25>>day#3, vancomycin 9/24>>9/25 FEN - heart healthy diet VTE - SCDs, holding chemical DVT prophylaxis due to anemia Foley - none Follow up - wound clinic  Plan - patient is on zosyn and WBC slightly up today. Recommending broadening coverage for MRSA and possible underlying osteomyelitis as sacral wound does extend down to bone (will discuss with internal medicine). Continue BID wet to dry dressing changes (Dakins for 2 more days) and hydrotherapy. Will return later today to view wound during hydrotherapy.   LOS: 3 days    Franne Forts ,  [facility] Surgery 02/10/2017, 8:53 AM Pager: [phone] Consults: [phone] Mon-Fri 7:00 am-4:30 pm Sat-Sun 7:00 am-11:30 am

## 2017-02-10 NOTE — Progress Notes (Signed)
PROGRESS NOTE  [name] Job NUU:[id number] DOB: 04/21/1927 DOA: 02/07/2017   PCP: [name], MD  HPI/Recap of past 24 hours: Pt still with sacral pain.   Assessment/Plan:  1. Sepsis/septic shock secondary to infected sacral pressure ulcer  - received hydration initially , but due to h/o diastolic chf, he is not on ivf, home diuretic held since admission - pt is no s/w I&D of sacral ulcer, stage IV (with purulent and necrotic debris , post op day #1 - no pulmonary edema on CXR - started on stress dose steroids due to hypotension  - BP remains stable this AM  - keep on Zosyn for now, agree with broadening coverage to Vancomycin  - prelim blood cultures with g+ rods, ? Contaminant, follow up on final report   2. Paroxysmal atrial fibrillation  - In a sinus rhythm on presentation  - CHADS-VASc is at least 66 (age x2, CHF)  - cont Amiodarone  - Xarelto held due to low Hg and need for I&D - ok to resume Xarelto   3. Chronic diastolic CHF  -  he received hydration initially, but due to h/o diastolic chf, he is not on continuous ivf, home diuretic held since admission, cxr no pulmonary edema - monitor daily weights: [facility] Weights   02/07/17 2159 02/09/17 0546 02/09/17 1847  Weight: 92.5 kg (204 lb) 98.3 kg (216 lb 11.4 oz) 99.9 kg (220 lb 3.8 oz)   4. Hypokalemia  - supplement and repeat BMP in AM  5. Anemia , normocytic - Hg overall stable, no signs of active bleeding - CBC in AM  recent left knee surgery: discharged to snf on 9/14  DVT prophylaxis: SCD's  Code Status: DNR   Family Communication: daughter at bedside  Disposition Plan: to be determined   Consultants:  general surgery  Procedures:  9/25 OR for wound debridment  Antibiotics:  vanc from admission to 9/25, restarted 9/27 -->  Zosyn from admission   Objective: BP 124/60 (BP Location: Right Arm)   Pulse 77   Temp 99.2 F (37.3 C) (Oral)   Resp (!) 24   Ht  (1.676 m)   Wt  99.9 kg (220 lb 3.8 oz)   SpO2 94%   BMI 35.55 kg/m   Intake/Output Summary (Last 24 hours) at 02/10/17 1830 Last data filed at 02/10/17 1825  Gross per 24 hour  Intake              867 ml  Output              650 ml  Net              217 ml   Filed Weights   02/07/17 2159 02/09/17 0546 02/09/17 1847  Weight: 92.5 kg (204 lb) 98.3 kg (216 lb 11.4 oz) 99.9 kg (220 lb 3.8 oz)   Physical Exam  Constitutional: Appears calm, NAD CVS: RRR, S1/S2 +, no murmurs, no gallops, no carotid bruit.  Pulmonary: Effort and breath sounds normal, diminished breath sounds at bases  Abdominal: Soft. BS +,  no distension, tenderness, rebound or guarding.  Musculoskeletal: non pitting edema in lower extremities   Data Reviewed: Basic Metabolic Panel:  Recent Labs Lab 02/07/17 2227 02/08/17 0717 02/09/17 0330 02/10/17 0552  NA 135 139 139 140  K 3.0* 3.1* 3.9 3.3*  CL 97* 103 102 102  CO2 GLUCOSE 144* 110* 102* 131*  BUN CREATININE  1.02 0.89 0.67 0.65  CALCIUM 8.1* 7.8* 8.2* 8.6*  MG  --   --  2.1  --    Liver Function Tests:  Recent Labs Lab 02/07/17 2227 02/09/17 0330  AST 66* 33  ALT 49 38  ALKPHOS 88 90  BILITOT 0.3 0.7  PROT 5.4* 5.4*  ALBUMIN 2.3* 2.2*   CBC:  Recent Labs Lab 02/07/17 2227 02/08/17 0717 02/09/17 0330 02/10/17 0552  WBC 15.3* 16.0* 16.1* 19.4*  NEUTROABS 12.9* 13.2* 14.2*  --   HGB 8.9* 8.3* 8.9* 8.6*  HCT 26.0* 24.6* 26.2* 26.2*  MCV 92.2 94.6 93.2 94.6  PLT 371 391 468* 559*   Cardiac Enzymes:    Recent Labs Lab 02/07/17 2227 02/08/17 0717 02/08/17 0950 02/08/17 1818  TROPONINI 0.03* <0.03 <0.03 0.03*   BNP (last 3 results)  Recent Labs  04/26/16 1128  BNP 89.3   CBG:  Recent Labs Lab 02/09/17 1633 02/09/17 2159 02/10/17 0731 02/10/17 1342 02/10/17 1828  GLUCAP 153* 184* 117* 115* 137*    Recent Results (from the past 240 hour(s))  Urine culture     Status: Abnormal   Collection Time:  02/07/17  9:59 PM  Result Value Ref Range Status   Specimen Description URINE, CLEAN CATCH  Final   Special Requests NONE  Final   Culture (A)  Final    <10,000 COLONIES/mL INSIGNIFICANT GROWTH Performed at [facility], [address], [place], [place] [address]    Report Status 02/09/2017 FINAL  Final  Wound or Superficial Culture     Status: None (Preliminary result)   Collection Time: 02/07/17 10:04 PM  Result Value Ref Range Status   Specimen Description BACK  Final   Special Requests NONE  Final   Gram Stain   Final    FEW WBC PRESENT, PREDOMINANTLY PMN MODERATE GRAM NEGATIVE RODS MODERATE GRAM POSITIVE COCCI FEW GRAM POSITIVE RODS    Culture   Final    CULTURE REINCUBATED FOR BETTER GROWTH Performed at [facility], [address] [place], [place] [address]    Report Status PENDING  Incomplete  Blood Culture (routine x 2)     Status: None (Preliminary result)   Collection Time: 02/07/17 10:20 PM  Result Value Ref Range Status   Specimen Description BLOOD LEFT FOREARM  Final   Special Requests   Final    BOTTLES DRAWN AEROBIC AND ANAEROBIC Blood Culture adequate volume   Culture  Setup Time   Final    GRAM POSITIVE RODS ANAEROBIC BOTTLE ONLY Organism ID to follow CRITICAL RESULT CALLED TO, READ BACK BY AND VERIFIED WITH: J. LEGGE PHARMD, AT 1008 02/09/17 BY D. VANHOOK Performed at [facility], [address] [place], [place] [address]    Culture GRAM POSITIVE RODS  Final   Report Status PENDING  Incomplete  Blood Culture ID Panel (Reflexed)     Status: None   Collection Time: 02/07/17 10:20 PM  Result Value Ref Range Status   Enterococcus species NOT DETECTED NOT DETECTED Final   Listeria monocytogenes NOT DETECTED NOT DETECTED Final   Staphylococcus species NOT DETECTED NOT DETECTED Final   Staphylococcus aureus NOT DETECTED NOT DETECTED Final   Streptococcus species NOT DETECTED NOT DETECTED Final   Streptococcus agalactiae NOT DETECTED NOT  DETECTED Final   Streptococcus pneumoniae NOT DETECTED NOT DETECTED Final   Streptococcus pyogenes NOT DETECTED NOT DETECTED Final   Acinetobacter baumannii NOT DETECTED NOT DETECTED Final   Enterobacteriaceae species NOT  DETECTED NOT DETECTED Final   Enterobacter cloacae complex NOT DETECTED NOT DETECTED Final   Escherichia coli NOT DETECTED NOT DETECTED Final   Klebsiella oxytoca NOT DETECTED NOT DETECTED Final   Klebsiella pneumoniae NOT DETECTED NOT DETECTED Final   Proteus species NOT DETECTED NOT DETECTED Final   Serratia marcescens NOT DETECTED NOT DETECTED Final   Haemophilus influenzae NOT DETECTED NOT DETECTED Final   Neisseria meningitidis NOT DETECTED NOT DETECTED Final   Pseudomonas aeruginosa NOT DETECTED NOT DETECTED Final   Candida albicans NOT DETECTED NOT DETECTED Final   Candida glabrata NOT DETECTED NOT DETECTED Final   Candida krusei NOT DETECTED NOT DETECTED Final   Candida parapsilosis NOT DETECTED NOT DETECTED Final   Candida tropicalis NOT DETECTED NOT DETECTED Final    Comment: Performed at [facility] Lab, [address], [place], [place] [address]  MRSA PCR Screening     Status: None   Collection Time: 02/08/17  5:29 AM  Result Value Ref Range Status   MRSA by PCR NEGATIVE NEGATIVE Final    Studies: No results found.  Scheduled Meds: . amiodarone  200 mg Oral Daily  . calcium-vitamin D  1 tablet Oral Once per day on Mon Wed Fri  . docusate sodium  100 mg Oral BID  . feeding supplement (ENSURE ENLIVE)  237 mL Oral Q24H  . gabapentin  400 mg Oral TID  . insulin aspart  0-5 Units Subcutaneous QHS  . insulin aspart  0-9 Units Subcutaneous TID WC  . magnesium oxide  400 mg Oral Daily  . midodrine  10 mg Oral TID WC  . multivitamin  1 tablet Oral BID  . polyethylene glycol  17 g Oral BID  . potassium chloride SA  20 mEq Oral BID  . protein supplement shake  11 oz Oral Q24H  . rivaroxaban  20 mg Oral Q supper  . sodium chloride flush  3 mL  Intravenous Q12H  . sodium chloride flush  3 mL Intravenous Q12H  . sodium hypochlorite   Irrigation BID  . tamsulosin  0.4 mg Oral Daily  . tiotropium  18 mcg Inhalation Daily    Continuous Infusions: . sodium chloride 250 mL (02/09/17 1900)  . famotidine (PEPCID) IV Stopped (02/10/17 1101)  . piperacillin-tazobactam (ZOSYN)  IV 3.375 g (02/10/17 1455)    Time spent: 25 minutes   Debbora Presto MD [facility] Pager [phone]. If 7PM-7AM, please contact night-coverage at [email or web address], password [facility] 02/10/2017, 6:30 PM  LOS: 3 days

## 2017-02-11 ENCOUNTER — Inpatient Hospital Stay (HOSPITAL_COMMUNITY): Payer: Medicare Other

## 2017-02-11 DIAGNOSIS — I10 Essential (primary) hypertension: Secondary | ICD-10-CM

## 2017-02-11 DIAGNOSIS — L89154 Pressure ulcer of sacral region, stage 4: Secondary | ICD-10-CM

## 2017-02-11 LAB — GLUCOSE, CAPILLARY
GLUCOSE-CAPILLARY: 105 mg/dL — AB (ref 65–99)
GLUCOSE-CAPILLARY: 126 mg/dL — AB (ref 65–99)
Glucose-Capillary: 109 mg/dL — ABNORMAL HIGH (ref 65–99)
Glucose-Capillary: 109 mg/dL — ABNORMAL HIGH (ref 65–99)

## 2017-02-11 LAB — BASIC METABOLIC PANEL
Anion gap: 5 (ref 5–15)
BUN: 18 mg/dL (ref 6–20)
CO2: 32 mmol/L (ref 22–32)
CREATININE: 0.74 mg/dL (ref 0.61–1.24)
Calcium: 8.7 mg/dL — ABNORMAL LOW (ref 8.9–10.3)
Chloride: 102 mmol/L (ref 101–111)
GFR calc Af Amer: >60 mL/min (ref >60)
GLUCOSE: 94 mg/dL (ref 65–99)
POTASSIUM: 4.6 mmol/L (ref 3.5–5.1)
SODIUM: 139 mmol/L (ref 135–145)

## 2017-02-11 LAB — CBC
HCT: 25.9 % — ABNORMAL LOW (ref 39.0–52.0)
Hemoglobin: 8.6 g/dL — ABNORMAL LOW (ref 13.0–17.0)
MCH: 31.4 pg (ref 26.0–34.0)
MCHC: 33.2 g/dL (ref 30.0–36.0)
MCV: 94.5 fL (ref 78.0–100.0)
Platelets: 513 10*3/uL — ABNORMAL HIGH (ref 150–400)
RBC: 2.74 MIL/uL — AB (ref 4.22–5.81)
RDW: 14.6 % (ref 11.5–15.5)
WBC: 17 10*3/uL — ABNORMAL HIGH (ref 4.0–10.5)

## 2017-02-11 MED ORDER — INFLUENZA VAC SPLIT HIGH-DOSE 0.5 ML IM SUSY
0.5000 mL | PREFILLED_SYRINGE | INTRAMUSCULAR | Status: AC
  Administered 2017-02-12: 0.5 mL via INTRAMUSCULAR
  Filled 2017-02-11: qty 0.5

## 2017-02-11 MED ORDER — ZOLPIDEM TARTRATE 5 MG PO TABS
5.0000 mg | ORAL_TABLET | Freq: Once | ORAL | Status: AC
  Administered 2017-02-11: 5 mg via ORAL
  Filled 2017-02-11: qty 1

## 2017-02-11 NOTE — Progress Notes (Addendum)
   02/11/17  Hydrotherapy note   Subjective Assessment  Subjective It's a 10(related to pain)   Patient and Family Stated Goals to get some pain relief  Date of Onset (surgical debridment at admission)  Evaluation and Treatment  Evaluation and Treatment Procedures Explained to Patient/Family Yes  Evaluation and Treatment Procedures agreed to  Wound / Incision (Open or Dehisced) 02/09/17 Other (Comment) Sacrum Medial lareg openn  nstage 4 wound, post surgical debridement.  Date First Assessed/Time First Assessed: 02/09/17 1448   Wound Type: Other (Comment)  Location: Sacrum  Location Orientation: Medial  Wound Description (Comments): lareg openn  nstage 4 wound, post surgical debridement.  Dressing Type Gauze (Comment);Moist with Dakins,to dry;ABD( last day for Dakins) (packing strip into tunnel along right  gluteus)  Dressing Changed New  Dressing Status Intact  Dressing Change Frequency (3 times)  Site / Wound Assessment Brown;Friable;Granulation tissue;Painful;Pale;Yellow  % Wound base Red or Granulating 20%  % Wound base Yellow/Fibrinous Exudate 70%  % Wound base Black/Eschar 20% (black over bone)  Peri-wound Assessment Erythema (non-blanchable);Excoriated;Maceration  Wound Length (cm) 10 cm  Wound Width (cm) 10 cm  Wound Depth (cm) 4 cm  Wound Volume (cm^3) 400 cm^3  Wound Surface Area (cm^2) 100 cm^2  Tunneling (cm) 8 (along right gluteus, tunnel of 4 cm in wound bed near center)  Undermining (cm) 4 (around  most of the wound)  Drainage Amount Moderate (mixed urine due to urinal overflowing)  Drainage Description Sanguineous;Purulent  Non-staged Wound Description Full thickness  Treatment Debridement (Selective);Hydrotherapy (Pulse lavage);Packing (Plain strip)  Hydrotherapy  Pulsed lavage therapy - wound location sacrum  Pulsed Lavage with Suction (psi) 8 psi  Pulsed Lavage with Suction - Normal Saline Used 1000 mL  Pulsed Lavage Tip Tip with splash shield (Tunnel tip)   Wound Therapy - Assess/Plan/Recommendations  Wound Therapy - Clinical Statement The wound has purulent drainage from right gluteus tunnel. DR. Fredricka Bonine in to inspect, to add packing strips to tunnel. The patient clearly has pain involved with procedure. Recommend IV pain medication at time of procedure if medically able to tolerate. Mobility efforts will be limited due to the pain with pressure in the wound. Surgery will  determine if  patient will need continued PLS after DC to SNF.  Wound Therapy - Functional Problem List significantly impaired mobility  pre and post LTKA.  Factors Delaying/Impairing Wound Healing Immobility;Multiple medical problems  Hydrotherapy Plan Debridement;Dressing change;Patient/family education;Pulsatile lavage with suction  Wound Therapy - Frequency 6X / week  Wound Therapy - Current Recommendations PT  Wound Therapy - Follow Up Recommendations Skilled nursing facility  Wound Plan PLS  and dressing changes, debridement  Wound Therapy Goals - Improve the function of patient's integumentary system by progressing the wound(s) through the phases of wound healing by:  Decrease Necrotic Tissue to 50  Decrease Necrotic Tissue - Progress Progressing toward goal  Increase Granulation Tissue to 50  Increase Granulation Tissue - Progress Progressing toward goal  Goals/treatment plan/discharge plan were made with and agreed upon by patient/family Yes  Time For Goal Achievement 2 weeks  Wound Therapy - Potential for Goals [name] PT [phone]

## 2017-02-11 NOTE — NC FL2 (Signed)
[place] MEDICAID FL2 LEVEL OF CARE SCREENING TOOL     IDENTIFICATION  Patient Name: [name] Birthdate: 10-19-1926 Sex: male Admission Date (Current Location): 02/07/2017  [facility] and [place] Number:  Producer, television/film/video and Address:  [facility],  [address], [place] [address]      Provider Number: [id number]  Attending Physician Name and Address:  Erick Blinks, MD  Relative Name and Phone Number:       Current Level of Care: Hospital Recommended Level of Care: Skilled Nursing Facility Prior Approval Number:    Date Approved/Denied:   PASRR Number: [id number]  Discharge Plan: SNF    Current Diagnoses: Patient Active Problem List   Diagnosis Date Noted  . Severe sepsis with septic shock (HCC)   . Sepsis, unspecified organism (HCC) 02/07/2017  . Infected pressure ulcer 02/07/2017  . Hypokalemia 02/07/2017  . Normocytic anemia 02/07/2017  . Osteoarthritis of left knee 01/31/2017  . Acute blood loss as cause of postoperative anemia 01/31/2017  . A-fib (HCC) 01/31/2017  . Polyneuropathy 01/31/2017  . Sacral ulcer, with unspecified severity (HCC) 01/31/2017  . S/P left TKA 01/24/2017  . S/P total knee replacement 01/24/2017  . Impacted cerumen of left ear 11/05/2016  . Preoperative cardiovascular examination 10/15/2016  . Non-ischemic cardiomyopathy (HCC) 10/15/2016  . Chronic systolic congestive heart failure (HCC) 05/28/2016  . Orthostatic hypotension 09/20/2015  . Impaired glucose tolerance 11/25/2014  . Bifascicular block 12/18/2013  . Bilateral edema of lower extremity 10/25/2013  . CHF (congestive heart failure) (HCC) 08/30/2013  . S/P left THA, AA 08/13/2013  . Chronic anticoagulation 12/06/2012  . Near syncope 12/06/2012  . Acute systolic CHF (congestive heart failure), NYHA class 3 -- Unclear etilogy (? Afib related) EF down from 60-70% to 40%. 07/05/2012  . General weakness 07/05/2012  . Paroxysmal Atrial fibrillation -  admitted with RVR 01/18/2012  . DJD (degenerative joint disease), lumbar 01/18/2012  . Hx of colonic polyps 11/02/2011  . BENIGN PROSTATIC HYPERTROPHY 05/13/2010  . SEBORRHEA CAPITIS 02/13/2010  . History of elevated glucose 10/01/2009  . CHRONIC RHINITIS 06/02/2009  . Hyperlipidemia 11/01/2008  . INSOMNIA, CHRONIC, MILD 01/02/2008  . Essential hypertension 02/06/2007  . ASTHMA 02/06/2007    Orientation RESPIRATION BLADDER Height & Weight     Self, Situation, Place  O2 (2L)   Weight: 220 lb 3.8 oz (99.9 kg) Height:   (167.6 cm)  BEHAVIORAL SYMPTOMS/MOOD NEUROLOGICAL BOWEL NUTRITION STATUS        Diet (heart healthy, thin fluid consistency)  AMBULATORY STATUS COMMUNICATION OF NEEDS Skin   Extensive Assist   Surgical wounds, Other (Comment)  Closed incision left knee     Stage IV sacral decubitus ulcer with purulent and necrotic debris S/p debridement of sacral decubitus ulcer, stage IV, final wound 10x10x4cm deep with 5cm tunneling bilaterally into the gluteus, exposed bone/periosteum in base of wound                     Personal Care Assistance Level of Assistance  Bathing, Feeding, Dressing Bathing Assistance: Maximum assistance Feeding assistance: Independent Dressing Assistance: Maximum assistance     Functional Limitations Info  Sight, Hearing, Speech Sight Info: Impaired Hearing Info: Adequate Speech Info: Adequate    SPECIAL CARE FACTORS FREQUENCY  PT (By licensed PT), OT (By licensed OT)     PT Frequency: 5x OT Frequency: 5x            Contractures Contractures Info: Not present  Additional Factors Info  Code Status, Allergies Code Status Info: DNR Allergies Info: Nsaids, Vicodin Hydrocodone-acetaminophen, Morphine And Related           Current Medications (02/11/2017):  This is the current hospital active medication list Current Facility-Administered Medications  Medication Dose Route Frequency Provider Last Rate Last Dose  . 0.9 %   sodium chloride infusion  250 mL Intravenous PRN Opyd, Lavone Neri, MD 10 mL/hr at 02/09/17 1900 250 mL at 02/09/17 1900  . acetaminophen (TYLENOL) tablet 650 mg  650 mg Oral Q6H PRN Opyd, Lavone Neri, MD   650 mg at 02/11/17 0913   Or  . acetaminophen (TYLENOL) suppository 650 mg  650 mg Rectal Q6H PRN Opyd, Lavone Neri, MD      . amiodarone (PACERONE) tablet 200 mg  200 mg Oral Daily Opyd, Lavone Neri, MD   200 mg at 02/11/17 0912  . calcium-vitamin D (OSCAL WITH D) 500-200 MG-UNIT per tablet 1 tablet  1 tablet Oral Once per day on Mon Wed Fri Opyd, Timothy S, MD   1 tablet at 02/09/17 1204  . docusate sodium (COLACE) capsule 100 mg  100 mg Oral BID Opyd, Lavone Neri, MD   100 mg at 02/11/17 0912  . famotidine (PEPCID) IVPB 20 mg premix  20 mg Intravenous Q12H Opyd, Lavone Neri, MD 100 mL/hr at 02/11/17 0913 20 mg at 02/11/17 0913  . feeding supplement (ENSURE ENLIVE) (ENSURE ENLIVE) liquid 237 mL  237 mL Oral Q24H Dorothea Ogle, MD   237 mL at 02/10/17 1030  . gabapentin (NEURONTIN) capsule 400 mg  400 mg Oral TID Briscoe Deutscher, MD   400 mg at 02/11/17 0912  . insulin aspart (novoLOG) injection 0-5 Units  0-5 Units Subcutaneous QHS Opyd, Timothy S, MD      . insulin aspart (novoLOG) injection 0-9 Units  0-9 Units Subcutaneous TID WC Opyd, Lavone Neri, MD   1 Units at 02/10/17 1857  . magnesium oxide (MAG-OX) tablet 400 mg  400 mg Oral Daily Opyd, Lavone Neri, MD   400 mg at 02/11/17 0912  . methocarbamol (ROBAXIN) tablet 500 mg  500 mg Oral Q6H PRN Opyd, Lavone Neri, MD      . midodrine (PROAMATINE) tablet 10 mg  10 mg Oral TID WC Opyd, Lavone Neri, MD   10 mg at 02/11/17 0900  . multivitamin (PROSIGHT) tablet 1 tablet  1 tablet Oral BID Opyd, Lavone Neri, MD   1 tablet at 02/11/17 0912  . ondansetron (ZOFRAN) tablet 4 mg  4 mg Oral Q6H PRN Opyd, Lavone Neri, MD       Or  . ondansetron (ZOFRAN) injection 4 mg  4 mg Intravenous Q6H PRN Opyd, Lavone Neri, MD   4 mg at 02/10/17 1013  . piperacillin-tazobactam (ZOSYN)  IVPB 3.375 g  3.375 g Intravenous Q8H Opyd, Lavone Neri, MD 12.5 mL/hr at 02/11/17 0630 3.375 g at 02/11/17 0630  . polyethylene glycol (MIRALAX / GLYCOLAX) packet 17 g  17 g Oral BID Opyd, Lavone Neri, MD   17 g at 02/11/17 0911  . potassium chloride SA (K-DUR,KLOR-CON) CR tablet 20 mEq  20 mEq Oral BID Opyd, Lavone Neri, MD   20 mEq at 02/11/17 0912  . protein supplement (PREMIER PROTEIN) liquid  11 oz Oral Q24H Dorothea Ogle, MD   11 oz at 02/10/17 2000  . rivaroxaban (XARELTO) tablet 20 mg  20 mg Oral Q supper Dorothea Ogle, MD   20 mg at 02/10/17  1913  . sodium chloride flush (NS) 0.9 % injection 3 mL  3 mL Intravenous Q12H Opyd, Lavone Neri, MD   3 mL at 02/09/17 1000  . sodium chloride flush (NS) 0.9 % injection 3 mL  3 mL Intravenous Q12H Opyd, Lavone Neri, MD   3 mL at 02/10/17 0030  . sodium chloride flush (NS) 0.9 % injection 3 mL  3 mL Intravenous PRN Opyd, Lavone Neri, MD      . sodium hypochlorite (DAKIN'S 1/4 STRENGTH) topical solution   Irrigation BID Meuth, Brooke A, [name]      . tamsulosin (FLOMAX) capsule 0.4 mg  0.4 mg Oral Daily Opyd, Lavone Neri, MD   0.4 mg at 02/11/17 0912  . tiotropium (SPIRIVA) inhalation capsule 18 mcg  18 mcg Inhalation Daily Opyd, Lavone Neri, MD   18 mcg at 02/11/17 0715  . traMADol (ULTRAM) tablet 50-100 mg  50-100 mg Oral Q6H PRN Opyd, Lavone Neri, MD   100 mg at 02/09/17 1418  . vancomycin (VANCOCIN) IVPB 750 mg/150 ml premix  750 mg Intravenous Q12H Shade, Jacqulyn Cane, RPH 150 mL/hr at 02/11/17 0530 750 mg at 02/11/17 0530     Discharge Medications: Please see discharge summary for a list of discharge medications.  Relevant Imaging Results:  Relevant Lab Results:   Additional Information SS # [id number]  Nelwyn Salisbury, LCSW

## 2017-02-11 NOTE — Progress Notes (Signed)
Patient ID: [name], male   DOB: 29-Oct-1926, 83 y.o.   MRN: [id number]  [facility] Surgery Progress Note  3 Days Post-Op  Subjective: CC- sacral decubitus ulcer  Patient states that he is still very sore around his sacrum. No other complaints. Currently undergoing hydrotherapy. WBC slightly down today 17.   Objective: Vital signs in last 24 hours: Temp:  [98.3 F (36.8 C)-99.7 F (37.6 C)] 98.3 F (36.8 C) (09/28 0859) Pulse Rate:  [74-76] 76 (09/28 0859) Resp:  [22] 22 (09/28 0604) BP: (93-118)/(40-46) 118/42 (09/28 0859) SpO2:  [91 %-98 %] 98 % (09/28 0859) Last BM Date: 02/10/17  Intake/Output from previous day: 09/27 0701 - 09/28 0700 In: 1357 [P.O.:597; I.V.:160; IV Piggyback:600] Out: 200 [Urine:200] Intake/Output this shift: No intake/output data recorded.  PE: Gen:  Alert, NAD, pleasant HEENT: EOM's intact, pupils equal and round Pulm:  effort normal Sacrum: visible area of wound appears to be cleaning up ok. There is an area that tunnels about 8cm down the left gluteus towards scrotum that expressed purulent drainage with palpation and probing. Significant amount of purulent fluid was milked out; no overlying erythema     Lab Results:   Recent Labs  02/10/17 0552 02/11/17 0452  WBC 19.4* 17.0*  HGB 8.6* 8.6*  HCT 26.2* 25.9*  PLT 559* 513*   BMET  Recent Labs  02/10/17 0552 02/11/17 0452  NA 140 139  K 3.3* 4.6  CL 102 102  CO2 30 32  GLUCOSE 131* 94  BUN 17 18  CREATININE 0.65 0.74  CALCIUM 8.6* 8.7*   PT/INR No results for input(s): LABPROT, INR in the last 72 hours. CMP     Component Value Date/Time   NA 139 02/11/2017 0452   K 4.6 02/11/2017 0452   CL 102 02/11/2017 0452   CO2 32 02/11/2017 0452   GLUCOSE 94 02/11/2017 0452   BUN 18 02/11/2017 0452   CREATININE 0.74 02/11/2017 0452   CREATININE 1.11 04/26/2016 1128   CALCIUM 8.7 (L) 02/11/2017 0452   PROT 5.4 (L) 02/09/2017 0330   ALBUMIN 2.2 (L) 02/09/2017 0330     AST 33 02/09/2017 0330   ALT 38 02/09/2017 0330   ALKPHOS 90 02/09/2017 0330   BILITOT 0.7 02/09/2017 0330   GFRNONAA >60 02/11/2017 0452   GFRAA >60 02/11/2017 0452   Lipase     Component Value Date/Time   LIPASE 23.0 12/30/2011 1000       Studies/Results: No results found.  Anti-infectives: Anti-infectives    Start     Dose/Rate Route Frequency Ordered Stop   02/11/17 0600  vancomycin (VANCOCIN) IVPB 750 mg/150 ml premix     750 mg 150 mL/hr over 60 Minutes Intravenous Every 12 hours 02/10/17 1846     02/10/17 1845  vancomycin (VANCOCIN) IVPB 1000 mg/200 mL premix     1,000 mg 200 mL/hr over 60 Minutes Intravenous NOW 02/10/17 1843 02/10/17 2014   02/08/17 1000  vancomycin (VANCOCIN) IVPB 750 mg/150 ml premix  Status:  Discontinued     750 mg 150 mL/hr over 60 Minutes Intravenous Every 12 hours 02/08/17 0504 02/08/17 1914   02/08/17 0600  piperacillin-tazobactam (ZOSYN) IVPB 3.375 g     3.375 g 12.5 mL/hr over 240 Minutes Intravenous Every 8 hours 02/08/17 0504     02/07/17 2200  piperacillin-tazobactam (ZOSYN) IVPB 3.375 g     3.375 g 100 mL/hr over 30 Minutes Intravenous  Once 02/07/17 2159 02/07/17 2323   02/07/17 2200  vancomycin (VANCOCIN) IVPB 1000 mg/200 mL premix     1,000 mg 200 mL/hr over 60 Minutes Intravenous  Once 02/07/17 2159 02/08/17 0035       Assessment/Plan PAF - on xarelto Chronic diastolic CHF (ECHO 08/2015 EF 50-55%) Hypotension on midrin S/p left TKA 01/24/17 Code status DNR  Stage IV sacral decubitus ulcer with purulent and necrotic debris S/p debridement of sacral decubitus ulcer, stage IV, final wound 10x10x4cm deep with 5cm tunneling bilaterally into the gluteus, exposed bone/periosteum in base of wound 9/25 Dr. Fredricka Bonine - POD 3 - undergoing hydrotherapy - WBC trending down 17, TMAX 99.7  ID - vancomycin restarted 9/28>>day#1, zosyn 9/25>>day#4, vancomycin 9/24>>9/25 FEN - heart healthy diet VTE - SCDs, xarelto Foley -  none Follow up - wound clinic  Plan - WBC trending down. Agree with IV zosyn/vancomycin. Continue hydrotherapy and BID wet to dry dressing changes (Dakins for 1 more day). Insert packing strips into area of left gluteus that tunnels to allow purulent material to drain.   LOS: 4 days    Franne Forts , [facility] 02/11/2017, 1:47 PM Pager: [phone] Consults: [phone] Mon-Fri 7:00 am-4:30 pm Sat-Sun 7:00 am-11:30 am

## 2017-02-11 NOTE — Progress Notes (Signed)
PROGRESS NOTE  [name] ZOX:[id number] DOB: [birth year] DOA: 02/07/2017   PCP: [name], MD  HPI/Recap of past 24 hours: Continues to have sacral pain. Had some shortness of breath this morning.  Assessment/Plan:  1. Sepsis/septic shock secondary to infected sacral pressure ulcer  - received hydration initially , but due to h/o diastolic chf, he is not on ivf, home diuretic held since admission - pt is no s/w I&D of sacral ulcer, stage IV (with purulent and necrotic debris , post op day #3 - no pulmonary edema on CXR - BP remains stable this AM  - keep on Zosyn for now, agree with broadening coverage to Vancomycin  - prelim blood cultures with g+ rods, ? Contaminant, follow up on final report   2. Paroxysmal atrial fibrillation  - In a sinus rhythm on presentation  - CHADS-VASc is at least 6 (age x2, CHF)  - cont Amiodarone  - Xarelto held due to low Hg and need for I&D - ok to resume Xarelto   3. Chronic diastolic CHF  -  he received hydration initially, but due to h/o diastolic chf, he is not on continuous ivf, home diuretic held since admission, cxr no pulmonary edema - since patient became short of breath this morning, will repeat xray - monitor daily weights: [facility] Weights   02/07/17 2159 02/09/17 0546 02/09/17 1847  Weight: 92.5 kg (204 lb) 98.3 kg (216 lb 11.4 oz) 99.9 kg (220 lb 3.8 oz)   4. Hypokalemia  - supplement and repeat BMP in AM  5. Anemia , normocytic - Hg overall stable, no signs of active bleeding - CBC in AM  recent left knee surgery: discharged to snf on 9/14. Continue physical therapy  DVT prophylaxis: SCD's  Code Status: DNR   Family Communication: daughter and son at bedside  Disposition Plan: to be determined   Consultants:  general surgery  Procedures:  9/25 OR for wound debridment  Antibiotics:  vanc from admission to 9/25, restarted 9/27 -->  Zosyn from admission   Objective: BP (!) 118/42 (BP  Location: Right Arm)   Pulse 76   Temp 98.3 F (36.8 C) (Oral)   Resp (!) 22   Ht  (1.676 m)   Wt 99.9 kg (220 lb 3.8 oz)   SpO2 98%   BMI 35.55 kg/m   Intake/Output Summary (Last 24 hours) at 02/11/17 1227 Last data filed at 02/11/17 0630  Gross per 24 hour  Intake              800 ml  Output              200 ml  Net              600 ml   Filed Weights   02/07/17 2159 02/09/17 0546 02/09/17 1847  Weight: 92.5 kg (204 lb) 98.3 kg (216 lb 11.4 oz) 99.9 kg (220 lb 3.8 oz)   Physical Exam  Constitutional: Appears calm, NAD CVS: RRR, S1/S2 +, no murmurs, no gallops, no carotid bruit.  Pulmonary: bilateral rhonchi, diminished breath sounds at bases  Abdominal: Soft. BS +,  no distension, tenderness, rebound or guarding.  Musculoskeletal: non pitting edema in lower extremities   Data Reviewed: Basic Metabolic Panel:  Recent Labs Lab 02/07/17 2227 02/08/17 0717 02/09/17 0330 02/10/17 0552 02/11/17 0452  NA 135 139 139 140 139  K 3.0* 3.1* 3.9 3.3* 4.6  CL 97* 103 102 102 102  CO2 29 29  26 30 32  GLUCOSE 144* 110* 102* 131* 94  BUN 18 15 14 17 18   CREATININE 1.02 0.89 0.67 0.65 0.74  CALCIUM 8.1* 7.8* 8.2* 8.6* 8.7*  MG  --   --  2.1  --   --    Liver Function Tests:  Recent Labs Lab 02/07/17 2227 02/09/17 0330  AST 66* 33  ALT 49 38  ALKPHOS 88 90  BILITOT 0.3 0.7  PROT 5.4* 5.4*  ALBUMIN 2.3* 2.2*   CBC:  Recent Labs Lab 02/07/17 2227 02/08/17 0717 02/09/17 0330 02/10/17 0552 02/11/17 0452  WBC 15.3* 16.0* 16.1* 19.4* 17.0*  NEUTROABS 12.9* 13.2* 14.2*  --   --   HGB 8.9* 8.3* 8.9* 8.6* 8.6*  HCT 26.0* 24.6* 26.2* 26.2* 25.9*  MCV 92.2 94.6 93.2 94.6 94.5  PLT 371 391 468* 559* 513*   Cardiac Enzymes:    Recent Labs Lab 02/07/17 2227 02/08/17 0717 02/08/17 0950 02/08/17 1818  TROPONINI 0.03* <0.03 <0.03 0.03*   BNP (last 3 results)  Recent Labs  04/26/16 1128  BNP 89.3   CBG:  Recent Labs Lab 02/10/17 1342  02/10/17 1828 02/10/17 2118 02/11/17 0734 02/11/17 1156  GLUCAP 115* 137* 129* 105* 109*    Recent Results (from the past 240 hour(s))  Urine culture     Status: Abnormal   Collection Time: 02/07/17  9:59 PM  Result Value Ref Range Status   Specimen Description URINE, CLEAN CATCH  Final   Special Requests NONE  Final   Culture (A)  Final    <10,000 COLONIES/mL INSIGNIFICANT GROWTH Performed at [facility], [address] [place], [place] [address]    Report Status 02/09/2017 FINAL  Final  Wound or Superficial Culture     Status: None (Preliminary result)   Collection Time: 02/07/17 10:04 PM  Result Value Ref Range Status   Specimen Description BACK  Final   Special Requests NONE  Final   Gram Stain   Final    FEW WBC PRESENT, PREDOMINANTLY PMN MODERATE GRAM NEGATIVE RODS MODERATE GRAM POSITIVE COCCI FEW GRAM POSITIVE RODS    Culture   Final    CULTURE REINCUBATED FOR BETTER GROWTH Performed at [facility], [address] [place], [place] [address]    Report Status PENDING  Incomplete  Blood Culture (routine x 2)     Status: None (Preliminary result)   Collection Time: 02/07/17 10:20 PM  Result Value Ref Range Status   Specimen Description BLOOD LEFT FOREARM  Final   Special Requests   Final    BOTTLES DRAWN AEROBIC AND ANAEROBIC Blood Culture adequate volume   Culture  Setup Time   Final    GRAM POSITIVE RODS ANAEROBIC BOTTLE ONLY Organism ID to follow CRITICAL RESULT CALLED TO, READ BACK BY AND VERIFIED WITH: J. LEGGE PHARMD, AT 1008 02/09/17 BY D. VANHOOK Performed at [facility], [address], [place], [place] [address]    Culture GRAM POSITIVE RODS  Final   Report Status PENDING  Incomplete  Blood Culture ID Panel (Reflexed)     Status: None   Collection Time: 02/07/17 10:20 PM  Result Value Ref Range Status   Enterococcus species NOT DETECTED NOT DETECTED Final   Listeria monocytogenes NOT DETECTED NOT DETECTED Final   Staphylococcus  species NOT DETECTED NOT DETECTED Final   Staphylococcus aureus NOT DETECTED NOT DETECTED Final   Streptococcus species NOT DETECTED NOT DETECTED Final   Streptococcus agalactiae NOT DETECTED NOT  DETECTED Final   Streptococcus pneumoniae NOT DETECTED NOT DETECTED Final   Streptococcus pyogenes NOT DETECTED NOT DETECTED Final   Acinetobacter baumannii NOT DETECTED NOT DETECTED Final   Enterobacteriaceae species NOT DETECTED NOT DETECTED Final   Enterobacter cloacae complex NOT DETECTED NOT DETECTED Final   Escherichia coli NOT DETECTED NOT DETECTED Final   Klebsiella oxytoca NOT DETECTED NOT DETECTED Final   Klebsiella pneumoniae NOT DETECTED NOT DETECTED Final   Proteus species NOT DETECTED NOT DETECTED Final   Serratia marcescens NOT DETECTED NOT DETECTED Final   Haemophilus influenzae NOT DETECTED NOT DETECTED Final   Neisseria meningitidis NOT DETECTED NOT DETECTED Final   Pseudomonas aeruginosa NOT DETECTED NOT DETECTED Final   Candida albicans NOT DETECTED NOT DETECTED Final   Candida glabrata NOT DETECTED NOT DETECTED Final   Candida krusei NOT DETECTED NOT DETECTED Final   Candida parapsilosis NOT DETECTED NOT DETECTED Final   Candida tropicalis NOT DETECTED NOT DETECTED Final    Comment: Performed at [facility], [address] [place], [place] [address]  MRSA PCR Screening     Status: None   Collection Time: 02/08/17  5:29 AM  Result Value Ref Range Status   MRSA by PCR NEGATIVE NEGATIVE Final    Studies: No results found.  Scheduled Meds: . amiodarone  200 mg Oral Daily  . calcium-vitamin D  1 tablet Oral Once per day on Mon Wed Fri  . docusate sodium  100 mg Oral BID  . feeding supplement (ENSURE ENLIVE)  237 mL Oral Q24H  . gabapentin  400 mg Oral TID  . insulin aspart  0-5 Units Subcutaneous QHS  . insulin aspart  0-9 Units Subcutaneous TID WC  . magnesium oxide  400 mg Oral Daily  . midodrine  10 mg Oral TID WC  . multivitamin  1 tablet Oral BID  .  polyethylene glycol  17 g Oral BID  . potassium chloride SA  20 mEq Oral BID  . protein supplement shake  11 oz Oral Q24H  . rivaroxaban  20 mg Oral Q supper  . sodium chloride flush  3 mL Intravenous Q12H  . sodium chloride flush  3 mL Intravenous Q12H  . sodium hypochlorite   Irrigation BID  . tamsulosin  0.4 mg Oral Daily  . tiotropium  18 mcg Inhalation Daily    Continuous Infusions: . sodium chloride 250 mL (02/09/17 1900)  . famotidine (PEPCID) IV 20 mg (02/11/17 0913)  . piperacillin-tazobactam (ZOSYN)  IV 3.375 g (02/11/17 0630)  . vancomycin 750 mg (02/11/17 0530)    Time spent: 25 minutes   MEMON,JEHANZEB MD [facility] Pager [phone]. If 7PM-7AM, please contact night-coverage at [email or web address], password [facility] 02/11/2017, 12:27 PM  LOS: 4 days

## 2017-02-11 NOTE — Progress Notes (Signed)
Physical therapy note- the patient  complains of significant pain during hydrotherapy. Please consider increased pain medication for the patient for wound care. Thank you. [name] PT [phone]

## 2017-02-11 NOTE — Plan of Care (Signed)
Problem: Activity: Goal: Risk for activity intolerance will decrease Outcome: Completed/Met Date Met: 02/11/17 Patient has decreased mobility at this time. Bed alarm in place

## 2017-02-11 NOTE — Clinical Social Work Note (Signed)
Clinical Social Work Assessment  Patient Details  Name: [name] MRN: [id number] Date of Birth: 1926-06-13  Date of referral:  02/11/17               Reason for consult:   (pt admitted from facility)                Permission sought to share information with:  Family Supports Permission granted to share information::  Yes, Verbal Permission Granted  Name::     daughter Stanton Kidney, son [name]  Agency::     Relationship::     Contact Information:     Housing/Transportation Living arrangements for the past 2 months:  Single Family Home (was in [facility] about one week prior to admission) Source of Information:  Adult Children Patient Interpreter Needed:  None Criminal Activity/Legal Involvement Pertinent to Current Situation/Hospitalization:  No - Comment as needed Significant Relationships:  Adult Children Lives with:  Adult Children Do you feel safe going back to the place where you live?  No (after completing rehab) Need for family participation in patient care:  Yes (Comment) (children primary decision makers in pt's care)  Care giving concerns:  Pt admitted from [facility] where he had gone for rehab following knee surgery at [facility] 01/28/17. Daughter reports while there pt's wound became worse leading to this admission.  Eventual goal is for pt to return home where he lives with his daughter and family, however has been evaluated to need continued rehab. Daughter notes they would not agree for pt to return to [facility] as they feel his wound was not well-managed there.    Social Worker assessment / plan:  Pt admitted from SNF where he had been for about 1 week for ST rehab.  Met with pt/family at bedside. Pt sleeping but daughter/son highly engaged. Will not return to [facility] as they feel "the care there lead to this wound becoming a problem and him back in the hospital." Discussed PT evaluation and recommendation for continued SNF rehab at DC. Daughter in agreement and  agreed to SNF search. CSW noted pt currently receiving hydrotherapy and should he still need this care at DC, SNF options will be extremely limited. Daughter understanding and states they will choose from their options when the care needed is known. Would prefer [facility] if available (does not offer hydrotherapy). Completed FL2 and referred to area SNFs.  Pt has [place] [employer] which requires pre-authorization for SNF. Faxed clinicals to insurance but will need facility choice and DC date in order to process request. Will follow.   Employment status:  Retired Nurse, adult PT Recommendations:  [place] / Referral to community resources:  [place]  Patient/Family's Response to care:  Pt sleeping but family very grateful for pt's care  Patient/Family's Understanding of and Emotional Response to Diagnosis, Current Treatment, and Prognosis:  Pt sleeping but family demostrates thorough understanding of plan and potential barriers. Daughter tearful but hopeful. States, "I hate this is happening to him. We thought the hip replacement and knee surgery would improve his pain but then this wound happened. We want to get him back home."  Emotional Assessment Appearance:  Appears stated age Attitude/Demeanor/Rapport:   (sleeping) Affect (typically observed):   (sleeping) Orientation:   (UTA, oriented except to time per staff) Alcohol / Substance use:  Not Applicable Psych involvement (Current and /or in the community):  No (Comment)  Discharge Needs  Concerns to  be addressed:  Discharge Planning Concerns Readmission within the last 30 days:  Yes Current discharge risk:  None Barriers to Discharge:  Continued Medical Work up   [employer], LCSW 02/11/2017, 2:17 PM  [phone]

## 2017-02-12 LAB — BPAM RBC
BLOOD PRODUCT EXPIRATION DATE: 201810062359
Blood Product Expiration Date: 201810062359
UNIT TYPE AND RH: 600
Unit Type and Rh: 600

## 2017-02-12 LAB — CBC
HEMATOCRIT: 26.5 % — AB (ref 39.0–52.0)
HEMOGLOBIN: 8.6 g/dL — AB (ref 13.0–17.0)
MCH: 31.5 pg (ref 26.0–34.0)
MCHC: 32.5 g/dL (ref 30.0–36.0)
MCV: 97.1 fL (ref 78.0–100.0)
Platelets: 506 10*3/uL — ABNORMAL HIGH (ref 150–400)
RBC: 2.73 MIL/uL — ABNORMAL LOW (ref 4.22–5.81)
RDW: 14.7 % (ref 11.5–15.5)
WBC: 15.1 10*3/uL — ABNORMAL HIGH (ref 4.0–10.5)

## 2017-02-12 LAB — BLOOD CULTURE ID PANEL (REFLEXED)
Acinetobacter baumannii: NOT DETECTED
CANDIDA GLABRATA: NOT DETECTED
CANDIDA KRUSEI: NOT DETECTED
CANDIDA PARAPSILOSIS: NOT DETECTED
CANDIDA TROPICALIS: NOT DETECTED
Candida albicans: NOT DETECTED
ENTEROBACTER CLOACAE COMPLEX: NOT DETECTED
ESCHERICHIA COLI: NOT DETECTED
Enterobacteriaceae species: NOT DETECTED
Enterococcus species: NOT DETECTED
Haemophilus influenzae: NOT DETECTED
KLEBSIELLA PNEUMONIAE: NOT DETECTED
Klebsiella oxytoca: NOT DETECTED
Listeria monocytogenes: NOT DETECTED
Neisseria meningitidis: NOT DETECTED
PROTEUS SPECIES: NOT DETECTED
Pseudomonas aeruginosa: NOT DETECTED
STAPHYLOCOCCUS SPECIES: NOT DETECTED
Serratia marcescens: NOT DETECTED
Staphylococcus aureus (BCID): NOT DETECTED
Streptococcus agalactiae: NOT DETECTED
Streptococcus pneumoniae: NOT DETECTED
Streptococcus pyogenes: NOT DETECTED
Streptococcus species: NOT DETECTED

## 2017-02-12 LAB — TYPE AND SCREEN
ABO/RH(D): A NEG
Antibody Screen: NEGATIVE
Unit division: 0
Unit division: 0

## 2017-02-12 LAB — CULTURE, BLOOD (ROUTINE X 2): Special Requests: ADEQUATE

## 2017-02-12 LAB — BASIC METABOLIC PANEL
ANION GAP: 6 (ref 5–15)
BUN: 12 mg/dL (ref 6–20)
CALCIUM: 8.5 mg/dL — AB (ref 8.9–10.3)
CHLORIDE: 101 mmol/L (ref 101–111)
CO2: 32 mmol/L (ref 22–32)
Creatinine, Ser: 0.65 mg/dL (ref 0.61–1.24)
GFR calc Af Amer: >60 mL/min (ref >60)
GFR calc non Af Amer: >60 mL/min (ref >60)
GLUCOSE: 94 mg/dL (ref 65–99)
POTASSIUM: 5 mmol/L (ref 3.5–5.1)
Sodium: 139 mmol/L (ref 135–145)

## 2017-02-12 LAB — GLUCOSE, CAPILLARY
GLUCOSE-CAPILLARY: 113 mg/dL — AB (ref 65–99)
GLUCOSE-CAPILLARY: 118 mg/dL — AB (ref 65–99)
Glucose-Capillary: 150 mg/dL — ABNORMAL HIGH (ref 65–99)
Glucose-Capillary: 128 mg/dL — ABNORMAL HIGH (ref 65–99)

## 2017-02-12 MED ORDER — GABAPENTIN 400 MG PO CAPS
800.0000 mg | ORAL_CAPSULE | Freq: Three times a day (TID) | ORAL | Status: DC
  Administered 2017-02-12 – 2017-02-21 (×27): 800 mg via ORAL
  Filled 2017-02-12 (×28): qty 2

## 2017-02-12 MED ORDER — GABAPENTIN 400 MG PO CAPS
400.0000 mg | ORAL_CAPSULE | Freq: Once | ORAL | Status: AC
  Administered 2017-02-12: 400 mg via ORAL
  Filled 2017-02-12: qty 1

## 2017-02-12 MED ORDER — FUROSEMIDE 10 MG/ML IJ SOLN
20.0000 mg | Freq: Every day | INTRAMUSCULAR | Status: DC
  Administered 2017-02-12 – 2017-02-19 (×8): 20 mg via INTRAVENOUS
  Filled 2017-02-12 (×8): qty 2

## 2017-02-12 NOTE — Progress Notes (Addendum)
Condom catheter not staying on patient. Patient incontinent and unable to feel when he is wet. Dressing and surround skin are becoming macerated d/t moisture and condom catheter failure. MD order to insert Foley catheter d/t stage IV wound. Verbal order to insert 20 coude cath d/t resistance with 38F catheter. 20 coude catheter inserted with urine return. of urine returned immediately post catheterization. Patient tolerated well.  [name] Clelia Croft, RN

## 2017-02-12 NOTE — Progress Notes (Signed)
PHARMACY - PHYSICIAN COMMUNICATION CRITICAL VALUE ALERT - BLOOD CULTURE IDENTIFICATION (BCID)  Results for orders placed or performed during the hospital encounter of 02/07/17  Blood Culture ID Panel (Reflexed) (Collected: 02/07/2017 10:27 PM)  Result Value Ref Range   Enterococcus species NOT DETECTED NOT DETECTED   Listeria monocytogenes NOT DETECTED NOT DETECTED   Staphylococcus species NOT DETECTED NOT DETECTED   Staphylococcus aureus NOT DETECTED NOT DETECTED   Streptococcus species NOT DETECTED NOT DETECTED   Streptococcus agalactiae NOT DETECTED NOT DETECTED   Streptococcus pneumoniae NOT DETECTED NOT DETECTED   Streptococcus pyogenes NOT DETECTED NOT DETECTED   Acinetobacter baumannii NOT DETECTED NOT DETECTED   Enterobacteriaceae species NOT DETECTED NOT DETECTED   Enterobacter cloacae complex NOT DETECTED NOT DETECTED   Escherichia coli NOT DETECTED NOT DETECTED   Klebsiella oxytoca NOT DETECTED NOT DETECTED   Klebsiella pneumoniae NOT DETECTED NOT DETECTED   Proteus species NOT DETECTED NOT DETECTED   Serratia marcescens NOT DETECTED NOT DETECTED   Haemophilus influenzae NOT DETECTED NOT DETECTED   Neisseria meningitidis NOT DETECTED NOT DETECTED   Pseudomonas aeruginosa NOT DETECTED NOT DETECTED   Candida albicans NOT DETECTED NOT DETECTED   Candida glabrata NOT DETECTED NOT DETECTED   Candida krusei NOT DETECTED NOT DETECTED   Candida parapsilosis NOT DETECTED NOT DETECTED   Candida tropicalis NOT DETECTED NOT DETECTED   Microbiology has identified organism as Clostridium Ramosum  Name of physician (or Provider) Contacted: Dr. Izola Price  Changes to prescribed antibiotics required: none at this time  Juliette Alcide, PharmD, BCPS.   Pager: [phone] 02/12/2017 2:39 PM

## 2017-02-12 NOTE — Progress Notes (Signed)
   02/12/17 1600  Hydrotherapy note  Subjective Assessment  Subjective I need some exercises on my leg so I can  walk  Date of Onset (pressure sore on admission, S/P debridement)  Evaluation and Treatment  Evaluation and Treatment Procedures Explained to Patient/Family Yes  Evaluation and Treatment Procedures agreed to  Wound / Incision (Open or Dehisced) 02/09/17 Other (Comment) Sacrum Medial lareg openn  nstage 4 wound, post surgical debridement.  Date First Assessed/Time First Assessed: 02/09/17 1448   Wound Type: Other (Comment)  Location: Sacrum  Location Orientation: Medial  Wound Description (Comments): lareg openn  nstage 4 wound, post surgical debridement.  Dressing Type Gauze (Comment);Moist to dry;ABD (packing strip to tunnel along right gluteus)  Dressing Changed New  Dressing Status Intact;Clean;Dry  Dressing Change Frequency Twice a day  Site / Wound Assessment Dressing in place / Unable to assess  % Wound base Red or Granulating 20%  % Wound base Yellow/Fibrinous Exudate 70%  % Wound base Black/Eschar 20%- dark over bone  Peri-wound Assessment Excoriated;Erythema (blanchable);Bleeding (more excoriation noted, patient soaked in urine.)  Wound Length (cm) 10 cm  Wound Width (cm) 10 cm  Wound Depth (cm) 4 cm  Wound Volume (cm^3) 400 cm^3  Wound Surface Area (cm^2) 100 cm^2  Tunneling (cm) 8 (at 6:00 along gluteaus(right), 4 cm at 3:00)  Margins Unattached edges (unapproximated)  Drainage Amount Other (Comment) (dressing soaked in urine)  Non-staged Wound Description Full thickness  Treatment Hydrotherapy (Pulse lavage);Packing (Saline gauze);Packing (Plain strip)  Hydrotherapy  Pulsed lavage therapy - wound location sacrum  Pulsed Lavage with Suction (psi) 8 psi  Pulsed Lavage with Suction - Normal Saline Used 1000 mL  Pulsed Lavage Tip Tip with splash shield  Wound Therapy - Assess/Plan/Recommendations  Wound Therapy - Clinical Statement The patient's condom  catheter leaking, the patient found lying in large amount of urine, saturating the wound dressing. The patient does not alert nursing of  feeling wetness. The patient frequently asking for urinal. The patient could benefit from indwelling catheter to prevent contamination of the sacral wound.The patient is difficult to roll for frequent bed changes as he is very debilitated.  The patiient is premedicated but reports 10 pain with PLS. Frequent breaks provided. The  wound is large with slow granulation tissue noted.   Would LTACH consult be indicated?   Wound Therapy - Functional Problem List significantly impaired mobility  pre and post LTKA.  Factors Delaying/Impairing Wound Healing Immobility;Multiple medical problems  Hydrotherapy Plan Debridement;Dressing change;Patient/family education;Pulsatile lavage with suction  Wound Therapy - Frequency 6X / week  Wound Therapy - Current Recommendations PT  Wound Therapy - Follow Up Recommendations Skilled nursing facility [facility])  Wound Plan PLS  and dressing chnages, debridement  Wound Therapy Goals - Improve the function of patient's integumentary system by progressing the wound(s) through the phases of wound healing by:  Decrease Necrotic Tissue to 50  Decrease Necrotic Tissue - Progress Progressing toward goal  Increase Granulation Tissue to 50  Increase Granulation Tissue - Progress Progressing toward goal  Improve Drainage Characteristics Min  Improve Drainage Characteristics - Progress Progressing toward goal  Patient/Family will be able to  reposition off of the sacrum  Patient/Family Instruction Goal - Progress Progressing toward goal  Goals/treatment plan/discharge plan were made with and agreed upon by patient/family Yes  Time For Goal Achievement 2 weeks  Wound Therapy - Potential for Goals [name] PT [phone]

## 2017-02-12 NOTE — Progress Notes (Addendum)
Patient ID: [name], male   DOB: 1927/03/17, 83 y.o.   MRN: [id number]    PROGRESS NOTE  [name]  WUJ:[id number] DOB: [birth year] DOA: 02/07/2017  PCP: [name], MD   Brief Narrative:  Pt is 83 yo male with known paroxysmal a-fib on Xarelto, chronic diastolic CHF, hypotension and recently started on Midodrine, s/p TKR 2 weeks prior to this admission, presented to [facility] ED from SNF with fevers, hypotension, severe pain from sacral ulcer.   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 Fredricka Bonine) - post op day #4 - pt still with significant sacral pain  - pt with no fevers, WBC is trending down, BP overall stable  - pt has been on Zosyn and today is day #5 - vancomycin was added for broader coverage, today is day #3 of Vancomycin  - appreciate surgery team following - continue with hydrotherapy and BID wet to dry dressing changes  Paroxysmal atrial fibrillation, CHADS-VASc at least 3 - continue Amiodarone and Xarelto   Chronic diastolic CHF  - has received hydration initially but IVF have been stopped - pt tolerating diet well - weight trend in the past 72 hours: Filed Weights   02/09/17 0546 02/09/17 1847 02/12/17 0708  Weight: 98.3 kg (216 lb 11.4 oz) 99.9 kg (220 lb 3.8 oz) 99.4 kg (219 lb 2.2 oz)  - mild pleural effusion on CXR but no pulmonary edema  - will start low dose lasix   Hypokalemia - supplemented and WNL this AM  Anemia , normocytic - Hg overall stable, no evidence of active bleeding - CBC in AM  Recent left knee surgery  - cont physical therapy   Hypotension - reasonably stable - keep on Midodrine   Obesity  - Body mass index is 35.37 kg/m.  DVT  prophylaxis: Xarelto  Code Status: Full  Family Communication: Patient at bedside  Disposition Plan: to be determined   Consultants:   Surgery   Procedures:   OR for I&D 9/25   Antimicrobials:   Zosyn 9/25 -->  Vancomycin 9/25 --> 9/25, restarted on 9/27 -->  Subjective: Pt still with sacral pain.   Objective: Vitals:   02/11/17 2153 02/12/17 0552 02/12/17 0708 02/12/17 0754  BP: (!) 105/43 (!) 118/44    Pulse: 72 73    Resp: 20 20    Temp: 97.8 F (36.6 C) 99.9 F (37.7 C)    TempSrc: Oral Oral    SpO2: 97% 97%  96%  Weight:   99.4 kg (219 lb 2.2 oz)   Height:        Intake/Output Summary (Last 24 hours) at 02/12/17 1127 Last data filed at 02/12/17 0552  Gross per 24 hour  Intake              550 ml  Output              550 ml  Net                0 ml   Filed Weights   02/09/17 0546 02/09/17 1847 02/12/17 0708  Weight: 98.3 kg (216 lb 11.4 oz) 99.9 kg (220 lb 3.8 oz) 99.4 kg (219 lb 2.2 oz)  Examination:  General exam: Appears calm and comfortable, HOH Respiratory system: Respiratory effort normal. Mild crackles at bases  Cardiovascular system: S1 & S2 heard, RRR. No rubs, gallops or clicks. Gastrointestinal system: Abdomen is nondistended, soft and nontender. No organomegaly or masses felt. Normal bowel sounds heard. Central nervous system: Alert and oriented. No focal neurological deficits. Extremities: bilateral LE non pitting edema, left knee surgical scar claen Skin: please see photo taken by surgical team 9/28, per pt's request, this part of the exam has been skipped  Data Reviewed: I have personally reviewed following labs and imaging studies  CBC:  Recent Labs Lab 02/07/17 2227 02/08/17 0717 02/09/17 0330 02/10/17 0552 02/11/17 0452 02/12/17 0457  WBC 15.3* 16.0* 16.1* 19.4* 17.0* 15.1*  NEUTROABS 12.9* 13.2* 14.2*  --   --   --   HGB 8.9* 8.3* 8.9* 8.6* 8.6* 8.6*  HCT 26.0* 24.6* 26.2* 26.2* 25.9* 26.5*  MCV 92.2 94.6 93.2 94.6  94.5 97.1  PLT 371 391 468* 559* 513* 506*   Basic Metabolic Panel:  Recent Labs Lab 02/08/17 0717 02/09/17 0330 02/10/17 0552 02/11/17 0452 02/12/17 0457  NA 139 139 140 139 139  K 3.1* 3.9 3.3* 4.6 5.0  CL 103 102 102 102 101  CO2 32 32  GLUCOSE 110* 102* 131* 94 94  BUN CREATININE 0.89 0.67 0.65 0.74 0.65  CALCIUM 7.8* 8.2* 8.6* 8.7* 8.5*  MG  --  2.1  --   --   --    Liver Function Tests:  Recent Labs Lab 02/07/17 2227 02/09/17 0330  AST 66* 33  ALT 49 38  ALKPHOS 88 90  BILITOT 0.3 0.7  PROT 5.4* 5.4*  ALBUMIN 2.3* 2.2*   Coagulation Profile:  Recent Labs Lab 02/07/17 2227  INR 1.89   Cardiac Enzymes:  Recent Labs Lab 02/07/17 2227 02/08/17 0717 02/08/17 0950 02/08/17 1818  TROPONINI 0.03* <0.03 <0.03 0.03*   CBG:  Recent Labs Lab 02/11/17 0734 02/11/17 1156 02/11/17 1811 02/11/17 2157 02/12/17 0755  GLUCAP 105* 109* 109* 126* 113*   Urine analysis:    Component Value Date/Time   COLORURINE YELLOW 02/07/2017 2158   APPEARANCEUR HAZY (A) 02/07/2017 2158   LABSPEC 1.010 02/07/2017 2158   PHURINE 5.0 02/07/2017 2158   GLUCOSEU NEGATIVE 02/07/2017 2158   HGBUR NEGATIVE 02/07/2017 2158   BILIRUBINUR NEGATIVE 02/07/2017 2158   KETONESUR NEGATIVE 02/07/2017 2158   PROTEINUR NEGATIVE 02/07/2017 2158   UROBILINOGEN 0.2 08/06/2013 1006   NITRITE NEGATIVE 02/07/2017 2158   LEUKOCYTESUR SMALL (A) 02/07/2017 2158   Recent Results (from the past 240 hour(s))  Urine culture     Status: Abnormal   Collection Time: 02/07/17  9:59 PM  Result Value Ref Range Status   Specimen Description URINE, CLEAN CATCH  Final   Special Requests NONE  Final   Culture (A)  Final    <10,000 COLONIES/mL INSIGNIFICANT GROWTH Performed at [facility], [address], [place], [place] [address]    Report Status 02/09/2017 FINAL  Final  Wound or Superficial Culture     Status: Abnormal   Collection Time: 02/07/17 10:04 PM    Result Value Ref Range Status   Specimen Description BACK  Final   Special Requests NONE  Final   Gram Stain   Final    FEW WBC PRESENT, PREDOMINANTLY PMN MODERATE GRAM NEGATIVE RODS MODERATE GRAM POSITIVE COCCI FEW GRAM POSITIVE RODS Performed at [facility],  [address] [place], [place] [address]    Culture MULTIPLE ORGANISMS PRESENT, NONE PREDOMINANT (A)  Final   Report Status 02/10/2017 FINAL  Final  Blood Culture (routine x 2)     Status: Abnormal   Collection Time: 02/07/17 10:20 PM  Result Value Ref Range Status   Specimen Description BLOOD LEFT FOREARM  Final   Special Requests   Final    BOTTLES DRAWN AEROBIC AND ANAEROBIC Blood Culture adequate volume   Culture  Setup Time   Final    GRAM POSITIVE RODS ANAEROBIC BOTTLE ONLY CRITICAL RESULT CALLED TO, READ BACK BY AND VERIFIED WITH: JSusa Day PHARMD, AT 1008 02/09/17 BY D. VANHOOK Performed at [facility], [address] [place], [place] [address]    Culture CLOSTRIDIUM RAMOSUM (A)  Final   Report Status 02/12/2017 FINAL  Final  Blood Culture ID Panel (Reflexed)     Status: None   Collection Time: 02/07/17 10:20 PM  Result Value Ref Range Status   Enterococcus species NOT DETECTED NOT DETECTED Final   Listeria monocytogenes NOT DETECTED NOT DETECTED Final   Staphylococcus species NOT DETECTED NOT DETECTED Final   Staphylococcus aureus NOT DETECTED NOT DETECTED Final   Streptococcus species NOT DETECTED NOT DETECTED Final   Streptococcus agalactiae NOT DETECTED NOT DETECTED Final   Streptococcus pneumoniae NOT DETECTED NOT DETECTED Final   Streptococcus pyogenes NOT DETECTED NOT DETECTED Final   Acinetobacter baumannii NOT DETECTED NOT DETECTED Final   Enterobacteriaceae species NOT DETECTED NOT DETECTED Final   Enterobacter cloacae complex NOT DETECTED NOT DETECTED Final   Escherichia coli NOT DETECTED NOT DETECTED Final   Klebsiella oxytoca NOT DETECTED NOT DETECTED Final   Klebsiella pneumoniae  NOT DETECTED NOT DETECTED Final   Proteus species NOT DETECTED NOT DETECTED Final   Serratia marcescens NOT DETECTED NOT DETECTED Final   Haemophilus influenzae NOT DETECTED NOT DETECTED Final   Neisseria meningitidis NOT DETECTED NOT DETECTED Final   Pseudomonas aeruginosa NOT DETECTED NOT DETECTED Final   Candida albicans NOT DETECTED NOT DETECTED Final   Candida glabrata NOT DETECTED NOT DETECTED Final   Candida krusei NOT DETECTED NOT DETECTED Final   Candida parapsilosis NOT DETECTED NOT DETECTED Final   Candida tropicalis NOT DETECTED NOT DETECTED Final    Comment: Performed at [facility], [address], [place], [place] [address]  Blood Culture (routine x 2)     Status: None (Preliminary result)   Collection Time: 02/07/17 10:27 PM  Result Value Ref Range Status   Specimen Description BLOOD RIGHT ANTECUBITAL  Final   Special Requests   Final    BOTTLES DRAWN AEROBIC AND ANAEROBIC Blood Culture adequate volume   Culture  Setup Time Organism ID to follow  Final   Culture   Final    NO GROWTH 3 DAYS Performed at [facility], [address], [place], [place] [address]    Report Status PENDING  Incomplete  Blood Culture ID Panel (Reflexed)     Status: None   Collection Time: 02/07/17 10:27 PM  Result Value Ref Range Status   Enterococcus species NOT DETECTED NOT DETECTED Final   Listeria monocytogenes NOT DETECTED NOT DETECTED Final   Staphylococcus species NOT DETECTED NOT DETECTED Final   Staphylococcus aureus NOT DETECTED NOT DETECTED Final   Streptococcus species NOT DETECTED NOT DETECTED Final   Streptococcus agalactiae NOT DETECTED NOT DETECTED Final   Streptococcus pneumoniae NOT DETECTED NOT DETECTED Final   Streptococcus pyogenes  NOT DETECTED NOT DETECTED Final   Acinetobacter baumannii NOT DETECTED NOT DETECTED Final   Enterobacteriaceae species NOT DETECTED NOT DETECTED Final   Enterobacter cloacae complex NOT DETECTED NOT DETECTED Final    Escherichia coli NOT DETECTED NOT DETECTED Final   Klebsiella oxytoca NOT DETECTED NOT DETECTED Final   Klebsiella pneumoniae NOT DETECTED NOT DETECTED Final   Proteus species NOT DETECTED NOT DETECTED Final   Serratia marcescens NOT DETECTED NOT DETECTED Final   Haemophilus influenzae NOT DETECTED NOT DETECTED Final   Neisseria meningitidis NOT DETECTED NOT DETECTED Final   Pseudomonas aeruginosa NOT DETECTED NOT DETECTED Final   Candida albicans NOT DETECTED NOT DETECTED Final   Candida glabrata NOT DETECTED NOT DETECTED Final   Candida krusei NOT DETECTED NOT DETECTED Final   Candida parapsilosis NOT DETECTED NOT DETECTED Final   Candida tropicalis NOT DETECTED NOT DETECTED Final    Comment: Performed at [facility] Lab, [address] [place], [place] [address]  MRSA PCR Screening     Status: None   Collection Time: 02/08/17  5:29 AM  Result Value Ref Range Status   MRSA by PCR NEGATIVE NEGATIVE Final    Comment:        The GeneXpert MRSA Assay (FDA approved for NASAL specimens only), is one component of a comprehensive MRSA colonization surveillance program. It is not intended to diagnose MRSA infection nor to guide or monitor treatment for MRSA infections.     Radiology Studies: Dg Chest Port 1 View  Result Date: 02/11/2017 CLINICAL DATA:  Shortness of breath. EXAM: PORTABLE CHEST 1 VIEW COMPARISON:  Radiograph of February 07, 2017. FINDINGS: Stable cardiomegaly. Atherosclerosis of thoracic aorta is noted. No pneumothorax is noted. Right lung is clear. Mild left basilar atelectasis is noted with small left pleural effusion. Bony thorax is unremarkable. IMPRESSION: Aortic atherosclerosis. Mild left basilar atelectasis with small left pleural effusion. Electronically Signed   By: Lupita Raider, M.D.   On: 02/11/2017 14:31    Scheduled Meds: . amiodarone  200 mg Oral Daily  . calcium-vitamin D  1 tablet Oral Once per day on Mon Wed Fri  . docusate sodium  100 mg Oral  BID  . feeding supplement (ENSURE ENLIVE)  237 mL Oral Q24H  . gabapentin  800 mg Oral TID  . insulin aspart  0-5 Units Subcutaneous QHS  . insulin aspart  0-9 Units Subcutaneous TID WC  . magnesium oxide  400 mg Oral Daily  . midodrine  10 mg Oral TID WC  . multivitamin  1 tablet Oral BID  . polyethylene glycol  17 g Oral BID  . potassium chloride SA  20 mEq Oral BID  . protein supplement shake  11 oz Oral Q24H  . rivaroxaban  20 mg Oral Q supper  . sodium chloride flush  3 mL Intravenous Q12H  . sodium chloride flush  3 mL Intravenous Q12H  . tamsulosin  0.4 mg Oral Daily  . tiotropium  18 mcg Inhalation Daily   Continuous Infusions: . sodium chloride 250 mL (02/09/17 1900)  . famotidine (PEPCID) IV 20 mg (02/12/17 1029)  . piperacillin-tazobactam (ZOSYN)  IV Stopped (02/12/17 0947)  . vancomycin 750 mg (02/12/17 0547)     LOS: 5 days   Time spent: 35 minutes   Debbora Presto, MD [facility] Pager [phone]  If 7PM-7AM, please contact night-coverage [email or web address] Password [facility] 02/12/2017, 11:27 AM

## 2017-02-13 LAB — BASIC METABOLIC PANEL
Anion gap: 6 (ref 5–15)
BUN: 12 mg/dL (ref 6–20)
CHLORIDE: 96 mmol/L — AB (ref 101–111)
CO2: 37 mmol/L — ABNORMAL HIGH (ref 22–32)
CREATININE: 0.57 mg/dL — AB (ref 0.61–1.24)
Calcium: 8.4 mg/dL — ABNORMAL LOW (ref 8.9–10.3)
GFR calc Af Amer: >60 mL/min (ref >60)
GFR calc non Af Amer: >60 mL/min (ref >60)
GLUCOSE: 98 mg/dL (ref 65–99)
POTASSIUM: 4.4 mmol/L (ref 3.5–5.1)
Sodium: 139 mmol/L (ref 135–145)

## 2017-02-13 LAB — CBC
HEMATOCRIT: 25.1 % — AB (ref 39.0–52.0)
HEMOGLOBIN: 8.2 g/dL — AB (ref 13.0–17.0)
MCH: 31.5 pg (ref 26.0–34.0)
MCHC: 32.7 g/dL (ref 30.0–36.0)
MCV: 96.5 fL (ref 78.0–100.0)
Platelets: 508 10*3/uL — ABNORMAL HIGH (ref 150–400)
RBC: 2.6 MIL/uL — AB (ref 4.22–5.81)
RDW: 14.6 % (ref 11.5–15.5)
WBC: 13.3 10*3/uL — ABNORMAL HIGH (ref 4.0–10.5)

## 2017-02-13 LAB — GLUCOSE, CAPILLARY
GLUCOSE-CAPILLARY: 135 mg/dL — AB (ref 65–99)
Glucose-Capillary: 146 mg/dL — ABNORMAL HIGH (ref 65–99)
Glucose-Capillary: 112 mg/dL — ABNORMAL HIGH (ref 65–99)
Glucose-Capillary: 113 mg/dL — ABNORMAL HIGH (ref 65–99)

## 2017-02-13 LAB — VANCOMYCIN, TROUGH: VANCOMYCIN TR: 15 ug/mL (ref 15–20)

## 2017-02-13 MED ORDER — FAMOTIDINE 20 MG PO TABS
20.0000 mg | ORAL_TABLET | Freq: Two times a day (BID) | ORAL | Status: DC
  Administered 2017-02-13 – 2017-02-21 (×17): 20 mg via ORAL
  Filled 2017-02-13 (×16): qty 1

## 2017-02-13 NOTE — Progress Notes (Signed)
PHARMACIST - PHYSICIAN COMMUNICATION  DR:   TRH  CONCERNING: IV to Oral Route Change Policy  RECOMMENDATION: This patient is receiving famotidine by the intravenous route.  Based on criteria approved by the Pharmacy and Therapeutics Committee, the intravenous medication(s) is/are being converted to the equivalent oral dose form(s).   DESCRIPTION: These criteria include:  The patient is eating (either orally or via tube) and/or has been taking other orally administered medications for a least 24 hours  The patient has no evidence of active gastrointestinal bleeding or impaired GI absorption (gastrectomy, short bowel, patient on TNA or NPO).  If you have questions about this conversion, please contact the Pharmacy Department  []   [phone] )  [name] []   [phone] )  [facility] []   [phone] )  [facility] []   [phone] )  [facility] [x]   [phone] )  [facility]   Juliette Alcide [place], [facility] 02/13/2017 10:02 AM

## 2017-02-13 NOTE — Progress Notes (Signed)
Dressing changed per orders. Patient tolerated well.  [name] Clelia Croft, RN

## 2017-02-13 NOTE — Progress Notes (Signed)
PHARMACY - PHYSICIAN COMMUNICATION CRITICAL VALUE ALERT - BLOOD CULTURE IDENTIFICATION (BCID)  Results for orders placed or performed during the hospital encounter of 02/07/17  Blood Culture ID Panel (Reflexed) (Collected: 02/07/2017 10:27 PM)  Result Value Ref Range   Enterococcus species NOT DETECTED NOT DETECTED   Listeria monocytogenes NOT DETECTED NOT DETECTED   Staphylococcus species NOT DETECTED NOT DETECTED   Staphylococcus aureus NOT DETECTED NOT DETECTED   Streptococcus species NOT DETECTED NOT DETECTED   Streptococcus agalactiae NOT DETECTED NOT DETECTED   Streptococcus pneumoniae NOT DETECTED NOT DETECTED   Streptococcus pyogenes NOT DETECTED NOT DETECTED   Acinetobacter baumannii NOT DETECTED NOT DETECTED   Enterobacteriaceae species NOT DETECTED NOT DETECTED   Enterobacter cloacae complex NOT DETECTED NOT DETECTED   Escherichia coli NOT DETECTED NOT DETECTED   Klebsiella oxytoca NOT DETECTED NOT DETECTED   Klebsiella pneumoniae NOT DETECTED NOT DETECTED   Proteus species NOT DETECTED NOT DETECTED   Serratia marcescens NOT DETECTED NOT DETECTED   Haemophilus influenzae NOT DETECTED NOT DETECTED   Neisseria meningitidis NOT DETECTED NOT DETECTED   Pseudomonas aeruginosa NOT DETECTED NOT DETECTED   Candida albicans NOT DETECTED NOT DETECTED   Candida glabrata NOT DETECTED NOT DETECTED   Candida krusei NOT DETECTED NOT DETECTED   Candida parapsilosis NOT DETECTED NOT DETECTED   Candida tropicalis NOT DETECTED NOT DETECTED   2nd set of BCx from 9/24 with GPR (likely anaerobe per micro lab), Gram neg coccobacilli  Name of physician (or Provider) Contacted: Dr. Izola Price  Changes to prescribed antibiotics required: No changes to vanco/zosyn at this time. Plan to d/w ID  Dannielle Huh 02/13/2017  8:38 AM

## 2017-02-13 NOTE — Progress Notes (Signed)
Pharmacy Antibiotic Note  [name] is a 83 y.o. male admitted on 02/07/2017 with cellulitis, sacral ulcer with purulent drainage and necrotic eschar.  Pharmacy was initially consulted for Vancomycin and Zosyn dosing.  Antibiotics were narrowed to Zosyn alone on 9/25, however, now pharmacy is consulted to resume Vancomycin dosing for broader coverage due to increasing WBC and possible osteo.  S/p debridement on 9/25.  Today, 02/13/2017: Day #5 zosyn, Day #3 vancomycin  Vancomycin trough = 72mcg/mL  SCr stable  WBC improving  Afebrile  9/24 Bcx 1/2: clostridium ramosum, 1/2 updated to GPR and GN coccobacilli (nothing detected on BCID)  Plan:  Continue Zosyn 3.375g IV Q8H infused over 4hrs.  Vancomycin  750 mg IV q12h - vancomycin trough within goal (15-109mcg/mL for possible osteomyelitis)  Daily SCr   Will page TRH re: updated blood culture results  Follow up renal fxn, culture results, and clinical course.  Height: 5\' 6"  (167.6 cm) Weight: 216 lb 0.8 oz (98 kg) IBW/kg (Calculated) : 63.8  Temp (24hrs), Avg:98.7 F (37.1 C), Min:98.3 F (36.8 C), Max:99 F (37.2 C)   Recent Labs Lab 02/07/17 2235 02/08/17 0105  02/09/17 0330 02/10/17 0552 02/11/17 0452 02/12/17 0457 02/13/17 0622  WBC  --   --   < > 16.1* 19.4* 17.0* 15.1* 13.3*  CREATININE  --   --   < > 0.67 0.65 0.74 0.65 0.57*  LATICACIDVEN 0.82 0.6  --   --   --   --   --   --   VANCOTROUGH  --   --   --   --   --   --   --  15  < > = values in this interval not displayed.  Estimated Creatinine Clearance: 71.3 mL/min (A) (by C-G formula based on SCr of 0.57 mg/dL (L)).    Allergies  Allergen Reactions  . Nsaids     Stomach pain  . Vicodin [Hydrocodone-Acetaminophen] Other (See Comments)    Unknown reaction many years ago  . Morphine And Related Nausea And Vomiting    Dizziness, light headed. "Opiates cause tightness in chest".    Antimicrobials this admission:  9/24 Vancomycin >> 9/25, 9/28  >> 9/24 Zosyn >>   Levels:  9/30 0530 VT = 15 mcg/mL on 750gm IV q12h (prior to 5th dose)  Microbiology results:  9/24 BCx x2: 1/2 Clostridium ramosum, other set with GPR and GN coccobacillus (Nothing detected per BCID)  9/24 UCx: insignificant growth 9/24 back wound: mx organisms, none predominant 9/25 MRSA PCR: neg  Thank you for allowing pharmacy to be a part of this patient's care.  Juliette Alcide, PharmD, BCPS.   Pager: [phone] 02/13/2017 9:34 AM

## 2017-02-13 NOTE — Progress Notes (Signed)
Patient ID: [name], male   DOB: 29-Dec-1926, 83 y.o.   MRN: [id number]    PROGRESS NOTE  [name]  WUJ:[id number] DOB: 1926/07/11 DOA: 02/07/2017  PCP: [name], MD   Brief Narrative:  Pt is 83 yo male with known paroxysmal a-fib on Xarelto, chronic diastolic CHF, hypotension and recently started on Midodrine, s/p TKR 2 weeks prior to this admission, [facility] to [facility] ED from SNF with fevers, hypotension, severe pain from sacral ulcer.   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 Fredricka Bonine) - post op day #5 - pt still with significant sacral pain  - pt with no fevers, WBC is trending down - pt has been on Zosyn and today is day #6 - vancomycin was added for broader coverage, today is day #4 of Vancomycin  - appreciate surgery team following - continue with hydrotherapy and BID wet to dry dressing changes  Paroxysmal atrial fibrillation, CHADS-VASc at least 3 - continue Amiodarone and Xarelto   Chronic diastolic CHF  - has received hydration initially but IVF have been stopped - pt tolerating diet well - weight trend in the past 72 hours: Filed Weights   02/09/17 1847 02/12/17 0708 02/13/17 0602  Weight: 99.9 kg (220 lb 3.8 oz) 99.4 kg (219 lb 2.2 oz) 98 kg (216 lb 0.8 oz)  - mild pleural effusion on CXR but no pulmonary edema  - continue low dose lasix   Hypokalemia - supplemented and WNL this AM  Anemia , normocytic - Hg overall stable, no evidence of active bleeding - CBC in AM  Recent left knee surgery  - cont physical therapy   Hypotension - reasonably stable - continue Midodrine   Obesity  - Body mass index is 35.37 kg/m.  DVT prophylaxis: Xarelto  Code  Status: Full  Family Communication: pt at bedside  Disposition Plan: to be determined   Consultants:   Surgery   Procedures:   OR for I&D 9/25   Antimicrobials:   Zosyn 9/25 -->  Vancomycin 9/25 --> 9/25, restarted on 9/27 -->  Subjective: Pt still with rectal pain.  Objective: Vitals:   02/12/17 2109 02/13/17 0602 02/13/17 1132 02/13/17 1441  BP: (!) 113/41 (!) 112/48  (!) 105/41  Pulse: 73 73  81  Resp: Temp: 98.8 F (37.1 C) 99 F (37.2 C)  98.2 F (36.8 C)  TempSrc: Oral Oral  Oral  SpO2: 98% 99% 98% 98%  Weight:  98 kg (216 lb 0.8 oz)    Height:        Intake/Output Summary (Last 24 hours) at 02/13/17 1659 Last data filed at 02/13/17 1442  Gross per 24 hour  Intake           1117.5 ml  Output             4175 ml  Net          -3057.5 ml   Filed Weights   02/09/17 1847 02/12/17 0708 02/13/17 0602  Weight: 99.9 kg (220 lb 3.8 oz) 99.4 kg (219 lb 2.2 oz) 98 kg (216 lb 0.8 oz)    Examination:  General exam: Appears calm and comfortable, HOH Respiratory system:  Respiratory effort normal. Mild crackles at bases  Cardiovascular system: S1 & S2 heard, RRR. No rubs, gallops or clicks. Gastrointestinal system: Abdomen is nondistended, soft and nontender. No organomegaly or masses felt. Normal bowel sounds heard. Central nervous system: Alert and oriented. No focal neurological deficits. Extremities: bilateral LE non pitting edema, left knee surgical scar claen Skin: please see photo taken by surgical team 9/28, per pt's request, this part of the exam has been skipped  Data Reviewed: I have personally reviewed following labs and imaging studies  CBC:  Recent Labs Lab 02/07/17 2227 02/08/17 0717 02/09/17 0330 02/10/17 0552 02/11/17 0452 02/12/17 0457 02/13/17 0622  WBC 15.3* 16.0* 16.1* 19.4* 17.0* 15.1* 13.3*  NEUTROABS 12.9* 13.2* 14.2*  --   --   --   --   HGB 8.9* 8.3* 8.9* 8.6* 8.6* 8.6* 8.2*  HCT 26.0* 24.6* 26.2* 26.2* 25.9*  26.5* 25.1*  MCV 92.2 94.6 93.2 94.6 94.5 97.1 96.5  PLT 371 391 468* 559* 513* 506* 508*   Basic Metabolic Panel:  Recent Labs Lab 02/09/17 0330 02/10/17 0552 02/11/17 0452 02/12/17 0457 02/13/17 0622  NA 139 140 139 139 139  K 3.9 3.3* 4.6 5.0 4.4  CL 102 102 102 101 96*  CO2 26 30 32 32 37*  GLUCOSE 102* 131* 94 94 98  BUN CREATININE 0.67 0.65 0.74 0.65 0.57*  CALCIUM 8.2* 8.6* 8.7* 8.5* 8.4*  MG 2.1  --   --   --   --    Liver Function Tests:  Recent Labs Lab 02/07/17 2227 02/09/17 0330  AST 66* 33  ALT 49 38  ALKPHOS 88 90  BILITOT 0.3 0.7  PROT 5.4* 5.4*  ALBUMIN 2.3* 2.2*   Coagulation Profile:  Recent Labs Lab 02/07/17 2227  INR 1.89   Cardiac Enzymes:  Recent Labs Lab 02/07/17 2227 02/08/17 0717 02/08/17 0950 02/08/17 1818  TROPONINI 0.03* <0.03 <0.03 0.03*   CBG:  Recent Labs Lab 02/12/17 1230 02/12/17 1652 02/12/17 2115 02/13/17 0723 02/13/17 1132  GLUCAP 118* 150* 128* 146* 112*   Urine analysis:    Component Value Date/Time   COLORURINE YELLOW 02/07/2017 2158   APPEARANCEUR HAZY (A) 02/07/2017 2158   LABSPEC 1.010 02/07/2017 2158   PHURINE 5.0 02/07/2017 2158   GLUCOSEU NEGATIVE 02/07/2017 2158   HGBUR NEGATIVE 02/07/2017 2158   BILIRUBINUR NEGATIVE 02/07/2017 2158   KETONESUR NEGATIVE 02/07/2017 2158   PROTEINUR NEGATIVE 02/07/2017 2158   UROBILINOGEN 0.2 08/06/2013 1006   NITRITE NEGATIVE 02/07/2017 2158   LEUKOCYTESUR SMALL (A) 02/07/2017 2158   Recent Results (from the past 240 hour(s))  Urine culture     Status: Abnormal   Collection Time: 02/07/17  9:59 PM  Result Value Ref Range Status   Specimen Description URINE, CLEAN CATCH  Final   Special Requests NONE  Final   Culture (A)  Final    <10,000 COLONIES/mL INSIGNIFICANT GROWTH Performed at [facility], [address], [place], [place] [address]    Report Status 02/09/2017 FINAL  Final  Wound or Superficial Culture     Status:  Abnormal   Collection Time: 02/07/17 10:04 PM  Result Value Ref Range Status   Specimen Description BACK  Final   Special Requests NONE  Final   Gram Stain   Final    FEW WBC PRESENT, PREDOMINANTLY PMN MODERATE GRAM NEGATIVE RODS MODERATE GRAM POSITIVE COCCI FEW GRAM POSITIVE RODS Performed at [facility], [address] [place], [place] [address]    Culture MULTIPLE ORGANISMS PRESENT, NONE PREDOMINANT (A)  Final   Report Status 02/10/2017 FINAL  Final  Blood Culture (routine x 2)     Status: Abnormal   Collection Time: 02/07/17 10:20 PM  Result Value Ref Range Status   Specimen Description BLOOD LEFT FOREARM  Final   Special Requests   Final    BOTTLES DRAWN AEROBIC AND ANAEROBIC Blood Culture adequate volume   Culture  Setup Time   Final    GRAM POSITIVE RODS ANAEROBIC BOTTLE ONLY CRITICAL RESULT CALLED TO, READ BACK BY AND VERIFIED WITH: JSusa Day PHARMD, AT 1008 02/09/17 BY D. VANHOOK Performed at [facility], [address] [place], [place] [address]    Culture CLOSTRIDIUM RAMOSUM (A)  Final   Report Status 02/12/2017 FINAL  Final  Blood Culture ID Panel (Reflexed)     Status: None   Collection Time: 02/07/17 10:20 PM  Result Value Ref Range Status   Enterococcus species NOT DETECTED NOT DETECTED Final   Listeria monocytogenes NOT DETECTED NOT DETECTED Final   Staphylococcus species NOT DETECTED NOT DETECTED Final   Staphylococcus aureus NOT DETECTED NOT DETECTED Final   Streptococcus species NOT DETECTED NOT DETECTED Final   Streptococcus agalactiae NOT DETECTED NOT DETECTED Final   Streptococcus pneumoniae NOT DETECTED NOT DETECTED Final   Streptococcus pyogenes NOT DETECTED NOT DETECTED Final   Acinetobacter baumannii NOT DETECTED NOT DETECTED Final   Enterobacteriaceae species NOT DETECTED NOT DETECTED Final   Enterobacter cloacae complex NOT DETECTED NOT DETECTED Final   Escherichia coli NOT DETECTED NOT DETECTED Final   Klebsiella oxytoca NOT  DETECTED NOT DETECTED Final   Klebsiella pneumoniae NOT DETECTED NOT DETECTED Final   Proteus species NOT DETECTED NOT DETECTED Final   Serratia marcescens NOT DETECTED NOT DETECTED Final   Haemophilus influenzae NOT DETECTED NOT DETECTED Final   Neisseria meningitidis NOT DETECTED NOT DETECTED Final   Pseudomonas aeruginosa NOT DETECTED NOT DETECTED Final   Candida albicans NOT DETECTED NOT DETECTED Final   Candida glabrata NOT DETECTED NOT DETECTED Final   Candida krusei NOT DETECTED NOT DETECTED Final   Candida parapsilosis NOT DETECTED NOT DETECTED Final   Candida tropicalis NOT DETECTED NOT DETECTED Final    Comment: Performed at [facility], [address] [place], [place] [address]  Blood Culture (routine x 2)     Status: None (Preliminary result)   Collection Time: 02/07/17 10:27 PM  Result Value Ref Range Status   Specimen Description BLOOD RIGHT ANTECUBITAL  Final   Special Requests   Final    BOTTLES DRAWN AEROBIC AND ANAEROBIC Blood Culture adequate volume   Culture  Setup Time   Final    GRAM POSITIVE RODS GRAM NEGATIVE COCCOBACILLI ANAEROBIC BOTTLE ONLY CRITICAL RESULT CALLED TO, READ BACK BY AND VERIFIED WITH: J LEGGE,PHARMD AT 0865 02/13/17 BY L BENFIELD Performed at [facility], [address] [place], [place] [address]    Culture GRAM POSITIVE RODS GRAM NEGATIVE COCCOBACILLI   Final   Report Status PENDING  Incomplete  Blood Culture ID Panel (Reflexed)     Status: None   Collection Time: 02/07/17 10:27 PM  Result Value Ref Range Status   Enterococcus species NOT DETECTED NOT DETECTED Final   Listeria monocytogenes NOT DETECTED NOT DETECTED Final   Staphylococcus species NOT DETECTED NOT DETECTED Final   Staphylococcus aureus NOT DETECTED NOT DETECTED Final   Streptococcus species NOT DETECTED NOT  DETECTED Final   Streptococcus agalactiae NOT DETECTED NOT DETECTED Final   Streptococcus pneumoniae NOT DETECTED NOT DETECTED Final   Streptococcus  pyogenes NOT DETECTED NOT DETECTED Final   Acinetobacter baumannii NOT DETECTED NOT DETECTED Final   Enterobacteriaceae species NOT DETECTED NOT DETECTED Final   Enterobacter cloacae complex NOT DETECTED NOT DETECTED Final   Escherichia coli NOT DETECTED NOT DETECTED Final   Klebsiella oxytoca NOT DETECTED NOT DETECTED Final   Klebsiella pneumoniae NOT DETECTED NOT DETECTED Final   Proteus species NOT DETECTED NOT DETECTED Final   Serratia marcescens NOT DETECTED NOT DETECTED Final   Haemophilus influenzae NOT DETECTED NOT DETECTED Final   Neisseria meningitidis NOT DETECTED NOT DETECTED Final   Pseudomonas aeruginosa NOT DETECTED NOT DETECTED Final   Candida albicans NOT DETECTED NOT DETECTED Final   Candida glabrata NOT DETECTED NOT DETECTED Final   Candida krusei NOT DETECTED NOT DETECTED Final   Candida parapsilosis NOT DETECTED NOT DETECTED Final   Candida tropicalis NOT DETECTED NOT DETECTED Final    Comment: Performed at [facility], [address], [place], [place] [address]  MRSA PCR Screening     Status: None   Collection Time: 02/08/17  5:29 AM  Result Value Ref Range Status   MRSA by PCR NEGATIVE NEGATIVE Final    Comment:        The GeneXpert MRSA Assay (FDA approved for NASAL specimens only), is one component of a comprehensive MRSA colonization surveillance program. It is not intended to diagnose MRSA infection nor to guide or monitor treatment for MRSA infections.     Radiology Studies: No results found.  Scheduled Meds: . amiodarone  200 mg Oral Daily  . calcium-vitamin D  1 tablet Oral Once per day on Mon Wed Fri  . docusate sodium  100 mg Oral BID  . famotidine  20 mg Oral BID  . feeding supplement (ENSURE ENLIVE)  237 mL Oral Q24H  . furosemide  20 mg Intravenous Daily  . gabapentin  800 mg Oral TID  . insulin aspart  0-5 Units Subcutaneous QHS  . insulin aspart  0-9 Units Subcutaneous TID WC  . magnesium oxide  400 mg Oral Daily  . midodrine   10 mg Oral TID WC  . multivitamin  1 tablet Oral BID  . polyethylene glycol  17 g Oral BID  . potassium chloride SA  20 mEq Oral BID  . protein supplement shake  11 oz Oral Q24H  . rivaroxaban  20 mg Oral Q supper  . sodium chloride flush  3 mL Intravenous Q12H  . sodium chloride flush  3 mL Intravenous Q12H  . tamsulosin  0.4 mg Oral Daily  . tiotropium  18 mcg Inhalation Daily   Continuous Infusions: . sodium chloride 250 mL (02/09/17 1900)  . piperacillin-tazobactam (ZOSYN)  IV 3.375 g (02/13/17 1545)  . vancomycin Stopped (02/13/17 0729)     LOS: 6 days   Time spent: 25 minutes   Debbora Presto, MD [facility] Pager [phone]  If 7PM-7AM, please contact night-coverage [email or web address] Password [email or web address] 02/13/2017, 4:58 PM

## 2017-02-14 LAB — BASIC METABOLIC PANEL
ANION GAP: 5 (ref 5–15)
BUN: 13 mg/dL (ref 6–20)
CHLORIDE: 93 mmol/L — AB (ref 101–111)
CO2: 40 mmol/L — ABNORMAL HIGH (ref 22–32)
Calcium: 8.7 mg/dL — ABNORMAL LOW (ref 8.9–10.3)
Creatinine, Ser: 0.61 mg/dL (ref 0.61–1.24)
GFR calc Af Amer: >60 mL/min (ref >60)
GLUCOSE: 107 mg/dL — AB (ref 65–99)
POTASSIUM: 4.4 mmol/L (ref 3.5–5.1)
SODIUM: 138 mmol/L (ref 135–145)

## 2017-02-14 LAB — GLUCOSE, CAPILLARY
GLUCOSE-CAPILLARY: 106 mg/dL — AB (ref 65–99)
GLUCOSE-CAPILLARY: 110 mg/dL — AB (ref 65–99)
GLUCOSE-CAPILLARY: 138 mg/dL — AB (ref 65–99)
Glucose-Capillary: 111 mg/dL — ABNORMAL HIGH (ref 65–99)

## 2017-02-14 LAB — CBC
HCT: 26.3 % — ABNORMAL LOW (ref 39.0–52.0)
HEMOGLOBIN: 8.4 g/dL — AB (ref 13.0–17.0)
MCH: 30.5 pg (ref 26.0–34.0)
MCHC: 31.9 g/dL (ref 30.0–36.0)
MCV: 95.6 fL (ref 78.0–100.0)
PLATELETS: 467 10*3/uL — AB (ref 150–400)
RBC: 2.75 MIL/uL — AB (ref 4.22–5.81)
RDW: 14.4 % (ref 11.5–15.5)
WBC: 10.1 10*3/uL (ref 4.0–10.5)

## 2017-02-14 MED ORDER — ENSURE ENLIVE PO LIQD
237.0000 mL | Freq: Three times a day (TID) | ORAL | Status: DC
  Administered 2017-02-14 – 2017-02-20 (×16): 237 mL via ORAL

## 2017-02-14 MED ORDER — JUVEN PO PACK
1.0000 | PACK | Freq: Two times a day (BID) | ORAL | Status: DC
  Administered 2017-02-16 – 2017-02-21 (×11): 1 via ORAL
  Filled 2017-02-14 (×15): qty 1

## 2017-02-14 NOTE — Progress Notes (Signed)
6 Days Post-Op    CC: sacral decubitus  Subjective: Getting hydrotherapy, and site cleaning up.  It sounds like this had started with Knee replacement in the hospital and got worse at SNF.    Objective: Vital signs in last 24 hours: Temp:  [98 F (36.7 C)-99 F (37.2 C)] 98 F (36.7 C) (10/01 0715) Pulse Rate:  [67-81] 67 (10/01 0715) Resp:  [18-22] 20 (10/01 0715) BP: (105-118)/(41-50) 118/48 (10/01 0715) SpO2:  [95 %-98 %] 95 % (10/01 0809) Weight:  [98.7 kg (217 lb 9.5 oz)] 98.7 kg (217 lb 9.5 oz) (10/01 0715) Last BM Date: 02/13/17  Intake/Output from previous day: 09/30 0701 - 10/01 0700 In: 1500 [P.O.:600; I.V.:250; IV Piggyback:650] Out: 3925 [Urine:3925] Intake/Output this shift: No intake/output data recorded.  General appearance: alert, cooperative and no distress Skin: 7cm x 4.5cm defect, he also has some undermining and a couple tracts, one at 10 O'clock, left buttocks that tracts about 10 cm with sterile applicator stick.  Not very large and being packed.  A second one at 6 oclock between left and right buttocks that is about 4 cm deep.   Cleaning up.    Lab Results:   Recent Labs  02/13/17 0622 02/14/17 0530  WBC 13.3* 10.1  HGB 8.2* 8.4*  HCT 25.1* 26.3*  PLT 508* 467*    BMET  Recent Labs  02/13/17 0622 02/14/17 0530  NA 139 138  K 4.4 4.4  CL 96* 93*  CO2 37* 40*  GLUCOSE 98 107*  BUN 12 13  CREATININE 0.57* 0.61  CALCIUM 8.4* 8.7*   PT/INR No results for input(s): LABPROT, INR in the last 72 hours.   Recent Labs Lab 02/07/17 2227 02/09/17 0330  AST 66* 33  ALT 49 38  ALKPHOS 88 90  BILITOT 0.3 0.7  PROT 5.4* 5.4*  ALBUMIN 2.3* 2.2*     Lipase     Component Value Date/Time   LIPASE 23.0 12/30/2011 1000     Medications: . amiodarone  200 mg Oral Daily  . calcium-vitamin D  1 tablet Oral Once per day on Mon Wed Fri  . docusate sodium  100 mg Oral BID  . famotidine  20 mg Oral BID  . feeding supplement (ENSURE ENLIVE)   237 mL Oral Q24H  . furosemide  20 mg Intravenous Daily  . gabapentin  800 mg Oral TID  . insulin aspart  0-5 Units Subcutaneous QHS  . insulin aspart  0-9 Units Subcutaneous TID WC  . magnesium oxide  400 mg Oral Daily  . midodrine  10 mg Oral TID WC  . multivitamin  1 tablet Oral BID  . polyethylene glycol  17 g Oral BID  . potassium chloride SA  20 mEq Oral BID  . protein supplement shake  11 oz Oral Q24H  . rivaroxaban  20 mg Oral Q supper  . sodium chloride flush  3 mL Intravenous Q12H  . sodium chloride flush  3 mL Intravenous Q12H  . tamsulosin  0.4 mg Oral Daily  . tiotropium  18 mcg Inhalation Daily   Anti-infectives    Start     Dose/Rate Route Frequency Ordered Stop   02/11/17 0600  vancomycin (VANCOCIN) IVPB 750 mg/150 ml premix     750 mg 150 mL/hr over 60 Minutes Intravenous Every 12 hours 02/10/17 1846     02/10/17 1845  vancomycin (VANCOCIN) IVPB 1000 mg/200 mL premix     1,000 mg 200 mL/hr over 60 Minutes Intravenous  NOW 02/10/17 1843 02/10/17 2014   02/08/17 1000  vancomycin (VANCOCIN) IVPB 750 mg/150 ml premix  Status:  Discontinued     750 mg 150 mL/hr over 60 Minutes Intravenous Every 12 hours 02/08/17 0504 02/08/17 1914   02/08/17 0600  piperacillin-tazobactam (ZOSYN) IVPB 3.375 g     3.375 g 12.5 mL/hr over 240 Minutes Intravenous Every 8 hours 02/08/17 0504     02/07/17 2200  piperacillin-tazobactam (ZOSYN) IVPB 3.375 g     3.375 g 100 mL/hr over 30 Minutes Intravenous  Once 02/07/17 2159 02/07/17 2323   02/07/17 2200  vancomycin (VANCOCIN) IVPB 1000 mg/200 mL premix     1,000 mg 200 mL/hr over 60 Minutes Intravenous  Once 02/07/17 2159 02/08/17 0035     Assessment/Plan PAF - on xarelto Chronic diastolic CHF (ECHO 08/2015 EF 50-55%) Hypotension on midrin S/p left TKA 01/24/17 Code status DNR  Stage IV sacral decubitus ulcer with purulent and necrotic debris S/p debridement of sacral decubitus ulcer, stage IV, final wound 10x10x4cm deep with 5cm  tunneling bilaterally into the gluteus, exposed bone/periosteum in base of wound 9/25 Dr. Fredricka Bonine - POD 3 - undergoing hydrotherapy - WBC trending down 17, TMAX 99.7  ID - vancomycin restarted 9/28 =>>day 4, zosyn 9/25>>day#4, vancomycin 9/24 =>> day 7 FEN - heart healthy diet VTE - SCDs, xarelto Foley - none Follow up - wound clinic  Plan:  He will need continued hydrotherapy on discharge.  Daily BID packings, he needs to be off his bottom as much as possible, so any increased mobilization will be a big help.  He also needs allot of protein to heal this large area of tissue loss. I will ask nutrition to see also.     LOS: 7 days    [name] 02/14/2017 [phone]

## 2017-02-14 NOTE — Progress Notes (Signed)
   02/14/17 1200  Subjective Assessment  Subjective Tell my daughter that I m finished.  Date of Onset (pressure sore on admission, s/P debridement)  Prior Treatments repositioning  Evaluation and Treatment  Evaluation and Treatment Procedures Explained to Patient/Family Yes  Evaluation and Treatment Procedures agreed to  Wound / Incision (Open or Dehisced) 02/09/17 Other (Comment) Sacrum Medial lareg openn  nstage 4 wound, post surgical debridement.  Date First Assessed/Time First Assessed: 02/09/17 1448   Wound Type: Other (Comment)  Location: Sacrum  Location Orientation: Medial  Wound Description (Comments): lareg openn  nstage 4 wound, post surgical debridement.  Dressing Type Gauze (Comment);Moist to dry;ABD (packing strip to tunnel along right gluteus)  Dressing Changed New  Dressing Status Intact;Clean;Dry  Dressing Change Frequency Twice a day  Site / Wound Assessment Dressing in place / Unable to assess  % Wound base Red or Granulating 40%  % Wound base Yellow/Fibrinous Exudate 60%  % Wound base Black/Eschar 20%  Peri-wound Assessment Excoriated;Erythema (blanchable);Bleeding (more excoriation noted, patient soaked in urine.)  Wound Length (cm) 10 cm  Wound Width (cm) 10 cm  Wound Depth (cm) 4 cm  Wound Volume (cm^3) 400 cm^3  Wound Surface Area (cm^2) 100 cm^2  Tunneling (cm) 5 (along left gluteus at 9:00, 10 CM at 10:00)  Undermining (cm) (4- around perimeter)  Margins Unattached edges (unapproximated)  Drainage Amount Moderate (dressing soaked in urine)  Drainage Description Serosanguineous  Non-staged Wound Description Full thickness  Treatment Hydrotherapy (Pulse lavage);Packing (Saline gauze)  Hydrotherapy  Pulsed lavage therapy - wound location sacrum  Pulsed Lavage with Suction (psi) 8 psi  Pulsed Lavage with Suction - Normal Saline Used 1000 mL  Pulsed Lavage Tip Tip with splash shield  Wound Therapy - Assess/Plan/Recommendations  Wound Therapy - Clinical  Statement the patient tolerated PLS. The wound has  2 areas of tunneling and undermining. The bone is felt in the base which is covered with slough.  CCS PA present  to inspect the wound.  Wound Therapy - Functional Problem List significantly impaired mobility  pre and post LTKA.  Factors Delaying/Impairing Wound Healing Immobility;Multiple medical problems  Hydrotherapy Plan Debridement;Dressing change;Patient/family education;Pulsatile lavage with suction  Wound Therapy - Frequency 6X / week  Wound Therapy - Current Recommendations PT  Wound Therapy - Follow Up Recommendations Skilled nursing facility [facility])  Wound Plan PLS  and dressing chnages, debridement  Wound Therapy Goals - Improve the function of patient's integumentary system by progressing the wound(s) through the phases of wound healing by:  Decrease Necrotic Tissue to 50  Decrease Necrotic Tissue - Progress Progressing toward goal  Increase Granulation Tissue to 50  Increase Granulation Tissue - Progress Progressing toward goal  Improve Drainage Characteristics Min  Improve Drainage Characteristics - Progress Progressing toward goal  Patient/Family will be able to  reposition off of the sacrum  Patient/Family Instruction Goal - Progress Progressing toward goal  Goals/treatment plan/discharge plan were made with and agreed upon by patient/family Yes  Time For Goal Achievement 2 weeks  Wound Therapy - Potential for Goals [name] PT [phone]

## 2017-02-14 NOTE — Progress Notes (Signed)
Nutrition Follow-up  DOCUMENTATION CODES:   Obesity unspecified  INTERVENTION:  - Will increase Ensure Enlive to TID, each supplement provides 350 kcal and 20 grams of protein. - Continue Premier Protein once/day, this supplement provides 160 kcal and 30 grams of protein.  - Will order Juven BID, this supplement will provide 80 kcal and 14 grams of amino acids.  - Continue to encourage PO intakes of meals and supplements. - Recommend vitamin C and zinc supplementation to aid in wound healing.   NUTRITION DIAGNOSIS:   Increased nutrient needs related to wound healing as evidenced by estimated needs. -ongoing  GOAL:   Patient will meet greater than or equal to 90% of their needs -unmet  MONITOR:   PO intake, Supplement acceptance, Weight trends, Labs, Skin  REASON FOR ASSESSMENT:   Consult Assessment of nutrition requirement/status (She has a decubitus with large loss of tissue.  Needs to be able to heal this from nutrition standpoint)  ASSESSMENT:   83 y.o. male with medical history significant for paroxysmal a. fib, chronic diastolic CHF, hypotension, and osteoarthritis status post TKA on 01/24/17. Presented to the ED from SNF for evaluation of fevers, low blood pressure, and severe pain from a sacral ulcer. Patient is accompanied by his daughter who assists with the history. He had reportedly developed a small red spot on his sacrum that was noted during his recent hospitalization for knee surgery. Since his discharge to a SNF, there is been skin breakdown and an expanding ulcer. Family reports noting that the ulcer has developed some black tissue, drainage, and without foul odor. Patient has been febrile at his nursing facility.  10/1 Per chart review, pt consumed 45% of dinner (300 kcal and 11 grams of protein) on 9/27; he only wanted Ensure for all three meals on 9/29; he only wanted Ensure for breakfast, consumed 50% of lunch (325 kcal and 18 grams of protein) and 30% of dinner  (245 kcal and 11 grams of protein) on 9/30. Will increase Ensure order and add Juven. Weight stable from last assessment.   Pt receiving hydrotherapy for wound for plan to continue this after d/c. S/p debridement of wound.   Medications reviewed; 1 tablet Oscal-D TID, 20 mg oral Pepcid BID, 20 mg IV Lasix/day, sliding scale Novolog, 400 mg oral Mag-ox/day, 1 tablet Prosight BID, 1 packet Miralax BID, 20 mEq oral KCl BID Labs reviewed; CBGs: 106 and 111 mg/dL this AM, Cl: 93 mmol/L, Ca: 8.7 mg/dL.   9/26 - Pt was made NPO yesterday AM for I&D of pressure injury.  - Diet advanced to CLD yesterday evening and then to Heart Healthy this AM.  - Pt with good appetite at baseline/PTA. - Increased protein needs d/t wound and surgical intervention. - No chewing or swallowing issues noted.  - Physical assessment shows no muscle and no fat wasting, mild edema to LUE and LLE.  - Per chart review, some weight fluctuation over the past 9 months (203-216 lbs since 05/28/16). Current weight is the highest and lowest weight was on the day of TKA. Will monitor weight trends closely.     Diet Order:  Diet Heart Room service appropriate? Yes; Fluid consistency: Thin  Skin:   Stage 4 sacral pressure injury  Last BM:  10/1  Height:   Ht Readings from Last 1 Encounters:  02/09/17 5\' 6"  (1.676 m)    Weight:   Wt Readings from Last 1 Encounters:  02/14/17 217 lb 9.5 oz (98.7 kg)    Ideal  Body Weight:  64.54 kg  BMI:  Body mass index is 35.12 kg/m.  Estimated Nutritional Needs:   Kcal:  1770-1965 (18-20 kcal/kg)  Protein:  100-118 grams (1-1.2 grams/kg)  Fluid:  >/= 2 L/day  EDUCATION NEEDS:   No education needs identified at this time    Trenton Gammon, MS, RD, LDN, CNSC Inpatient Clinical Dietitian Pager # [phone] After hours/weekend pager # [phone]

## 2017-02-14 NOTE — Care Management Note (Signed)
Case Management Note  Patient Details  Name: [name] MRN: [id number] Date of Birth: 1926-11-25  Subjective/Objective: Infected sacral ulcer;CHF.PT-hydrotherapy.s/p  D/c plan SNF-CSW following.                   Action/Plan:d/c SNF.   Expected Discharge Date:                  Expected Discharge Plan:  Skilled Nursing Facility  In-House Referral:  Clinical Social Work  Discharge planning Services  CM Consult  Post Acute Care Choice:    Choice offered to:     DME Arranged:    DME Agency:     HH Arranged:    HH Agency:     Status of Service:  In process, will continue to follow  If discussed at Long Length of Stay Meetings, dates discussed:    Additional Comments:  Lanier Clam, RN 02/14/2017, 11:48 AM

## 2017-02-14 NOTE — Plan of Care (Signed)
Problem: Nutrition: Goal: Adequate nutrition will be maintained Outcome: Completed/Met Date Met: 02/14/17 Pt has verbal  tolerating meals well

## 2017-02-14 NOTE — Progress Notes (Signed)
Patient ID: [name], male   DOB: 1927/01/28, 83 y.o.   MRN: [id number]    PROGRESS NOTE  [name]  WUJ:[id number] DOB: 1926-08-17 DOA: 02/07/2017  PCP: [name], MD   Brief Narrative:  Pt is 83 yo male with known paroxysmal a-fib on Xarelto, chronic diastolic CHF, hypotension and recently started on Midodrine, s/p TKR 2 weeks prior to this admission, presented to [facility] ED from SNF with fevers, hypotension, severe pain from sacral ulcer.   Assessment & Plan: Sepsis/septic shock secondary to infected sacral pressure ulcer, ? Osteomyelitis  - stage IV sacral decubitus ulcer with purulent and necrotic debris - s/p debridement of sacral ulcer, final wound 10 x 10 x 4 cm deep with 5 cm tunneling bilaterally into the gluteus, exposed bone/periosteum in the base of the wound, 9/25 (Dr. Fredricka Bonine) - post op day #6 - Tmax 90F in the past 24 hours, WBC is now WNL - continue Zosyn day #7, vanc day #5 - preliminary blood cultures left arm positive for Clostridium and right arm gram + rods and gram - coccobacilli - I have discussed this was ID specialist Dr. Luciana Axe, he recommended prolonged IV treatment with IV ABX for 6 weeks as this could be treated as osteomyelitis, Zosyn can be narrowed down to Rocephin and flagyl prior to discharge  - appreciate surgery team following - continue with hydrotherapy and BID wet to dry dressing changes  Paroxysmal atrial fibrillation, CHADS-VASc at least 3 - continue Amiodarone and Xarelto   Chronic diastolic CHF  - has received hydration initially but IVF have been stopped - pt tolerating diet well - weight trend in the past 72 hours: Filed Weights   02/12/17 0708 02/13/17 0602 02/14/17 0715  Weight: 99.4 kg (219 lb 2.2 oz) 98 kg (216 lb 0.8 oz) 98.7 kg (217 lb 9.5 oz)  - mild pleural effusion on CXR but no pulmonary edema  - cont with low dose lasix  - I have educated pt on use of IS, he has done well and encourage to use Q1 hour while awake  at least 4-5 time in an hour and he has verbalized understanding   Hypokalemia - supplemented and WNL this AM  Anemia , normocytic - Hg overall stable, no evidence of active bleeding - CBC in AM  Recent left knee surgery  - cont physical therapy   Hypotension - reasonably stable - continue Midodrine   Obesity  - Body mass index is 35.37 kg/m.  DVT prophylaxis: Xarelto  Code Status: Full  Family Communication: pt at bedside  Disposition Plan: to be determined   Consultants:   Surgery   Procedures:   OR for I&D 9/25   Antimicrobials:   Zosyn 9/25 -->  Vancomycin 9/25 --> 9/25, restarted on 9/27 -->  Subjective: Pt still with rectal pain.  Objective: Vitals:   02/13/17 2021 02/14/17 0715 02/14/17 0809 02/14/17 1445  BP: (!) 112/50 (!) 118/48  (!) 114/48  Pulse: 70 67  66  Resp: (!) Temp: 99 F (37.2 C) 98 F (36.7 C)  98 F (36.7 C)  TempSrc: Oral Oral  Oral  SpO2: 98% 95% 95% 96%  Weight:  98.7 kg (217 lb 9.5 oz)    Height:        Intake/Output Summary (Last 24 hours) at 02/14/17 1715 Last data filed at 02/14/17 1625  Gross per 24 hour  Intake  1030 ml  Output             3326 ml  Net            -2296 ml   Filed Weights   02/12/17 0708 02/13/17 0602 02/14/17 0715  Weight: 99.4 kg (219 lb 2.2 oz) 98 kg (216 lb 0.8 oz) 98.7 kg (217 lb 9.5 oz)    Physical Exam  Constitutional: Appears pleasant and calm, NAD CVS: RRR, S1/S2 +, no gallops, no carotid bruit.  Pulmonary: Effort and breath sounds normal, diminished air movement at bases Abdominal: Soft. BS +,  no distension, tenderness, rebound or guarding.  Musculoskeletal: Normal range of motion. Surgical site on the left knee clean   Psychiatric: Normal mood and affect. Behavior, judgment, thought content normal.   Data Reviewed: I have personally reviewed following labs and imaging studies  CBC:  Recent Labs Lab 02/07/17 2227 02/08/17 0717 02/09/17 0330  02/10/17 0552 02/11/17 0452 02/12/17 0457 02/13/17 0622 02/14/17 0530  WBC 15.3* 16.0* 16.1* 19.4* 17.0* 15.1* 13.3* 10.1  NEUTROABS 12.9* 13.2* 14.2*  --   --   --   --   --   HGB 8.9* 8.3* 8.9* 8.6* 8.6* 8.6* 8.2* 8.4*  HCT 26.0* 24.6* 26.2* 26.2* 25.9* 26.5* 25.1* 26.3*  MCV 92.2 94.6 93.2 94.6 94.5 97.1 96.5 95.6  PLT 371 391 468* 559* 513* 506* 508* 467*   Basic Metabolic Panel:  Recent Labs Lab 02/09/17 0330 02/10/17 0552 02/11/17 0452 02/12/17 0457 02/13/17 0622 02/14/17 0530  NA 139 140 139 139 139 138  K 3.9 3.3* 4.6 5.0 4.4 4.4  CL 102 102 102 101 96* 93*  CO2 26 30 32 32 37* 40*  GLUCOSE 102* 131* 94 94 98 107*  BUN CREATININE 0.67 0.65 0.74 0.65 0.57* 0.61  CALCIUM 8.2* 8.6* 8.7* 8.5* 8.4* 8.7*  MG 2.1  --   --   --   --   --    Liver Function Tests:  Recent Labs Lab 02/07/17 2227 02/09/17 0330  AST 66* 33  ALT 49 38  ALKPHOS 88 90  BILITOT 0.3 0.7  PROT 5.4* 5.4*  ALBUMIN 2.3* 2.2*   Coagulation Profile:  Recent Labs Lab 02/07/17 2227  INR 1.89   Cardiac Enzymes:  Recent Labs Lab 02/07/17 2227 02/08/17 0717 02/08/17 0950 02/08/17 1818  TROPONINI 0.03* <0.03 <0.03 0.03*   CBG:  Recent Labs Lab 02/13/17 1733 02/13/17 2021 02/14/17 0729 02/14/17 1137 02/14/17 1710  GLUCAP 135* 113* 106* 111* 110*   Urine analysis:    Component Value Date/Time   COLORURINE YELLOW 02/07/2017 2158   APPEARANCEUR HAZY (A) 02/07/2017 2158   LABSPEC 1.010 02/07/2017 2158   PHURINE 5.0 02/07/2017 2158   GLUCOSEU NEGATIVE 02/07/2017 2158   HGBUR NEGATIVE 02/07/2017 2158   BILIRUBINUR NEGATIVE 02/07/2017 2158   KETONESUR NEGATIVE 02/07/2017 2158   PROTEINUR NEGATIVE 02/07/2017 2158   UROBILINOGEN 0.2 08/06/2013 1006   NITRITE NEGATIVE 02/07/2017 2158   LEUKOCYTESUR SMALL (A) 02/07/2017 2158   Recent Results (from the past 240 hour(s))  Urine culture     Status: Abnormal   Collection Time: 02/07/17  9:59 PM  Result Value  Ref Range Status   Specimen Description URINE, CLEAN CATCH  Final   Special Requests NONE  Final   Culture (A)  Final    <10,000 COLONIES/mL INSIGNIFICANT GROWTH Performed at [facility], [address] [place], [place] [address]  Report Status 02/09/2017 FINAL  Final  Wound or Superficial Culture     Status: Abnormal   Collection Time: 02/07/17 10:04 PM  Result Value Ref Range Status   Specimen Description BACK  Final   Special Requests NONE  Final   Gram Stain   Final    FEW WBC PRESENT, PREDOMINANTLY PMN MODERATE GRAM NEGATIVE RODS MODERATE GRAM POSITIVE COCCI FEW GRAM POSITIVE RODS Performed at [facility], [address] [place], [place] [address]    Culture MULTIPLE ORGANISMS PRESENT, NONE PREDOMINANT (A)  Final   Report Status 02/10/2017 FINAL  Final  Blood Culture (routine x 2)     Status: Abnormal   Collection Time: 02/07/17 10:20 PM  Result Value Ref Range Status   Specimen Description BLOOD LEFT FOREARM  Final   Special Requests   Final    BOTTLES DRAWN AEROBIC AND ANAEROBIC Blood Culture adequate volume   Culture  Setup Time   Final    GRAM POSITIVE RODS ANAEROBIC BOTTLE ONLY CRITICAL RESULT CALLED TO, READ BACK BY AND VERIFIED WITH: JSusa Day PHARMD, AT 1008 02/09/17 BY D. VANHOOK Performed at [facility], [address] [place], [place] [address]    Culture CLOSTRIDIUM RAMOSUM (A)  Final   Report Status 02/12/2017 FINAL  Final  Blood Culture ID Panel (Reflexed)     Status: None   Collection Time: 02/07/17 10:20 PM  Result Value Ref Range Status   Enterococcus species NOT DETECTED NOT DETECTED Final   Listeria monocytogenes NOT DETECTED NOT DETECTED Final   Staphylococcus species NOT DETECTED NOT DETECTED Final   Staphylococcus aureus NOT DETECTED NOT DETECTED Final   Streptococcus species NOT DETECTED NOT DETECTED Final   Streptococcus agalactiae NOT DETECTED NOT DETECTED Final   Streptococcus pneumoniae NOT DETECTED NOT DETECTED  Final   Streptococcus pyogenes NOT DETECTED NOT DETECTED Final   Acinetobacter baumannii NOT DETECTED NOT DETECTED Final   Enterobacteriaceae species NOT DETECTED NOT DETECTED Final   Enterobacter cloacae complex NOT DETECTED NOT DETECTED Final   Escherichia coli NOT DETECTED NOT DETECTED Final   Klebsiella oxytoca NOT DETECTED NOT DETECTED Final   Klebsiella pneumoniae NOT DETECTED NOT DETECTED Final   Proteus species NOT DETECTED NOT DETECTED Final   Serratia marcescens NOT DETECTED NOT DETECTED Final   Haemophilus influenzae NOT DETECTED NOT DETECTED Final   Neisseria meningitidis NOT DETECTED NOT DETECTED Final   Pseudomonas aeruginosa NOT DETECTED NOT DETECTED Final   Candida albicans NOT DETECTED NOT DETECTED Final   Candida glabrata NOT DETECTED NOT DETECTED Final   Candida krusei NOT DETECTED NOT DETECTED Final   Candida parapsilosis NOT DETECTED NOT DETECTED Final   Candida tropicalis NOT DETECTED NOT DETECTED Final    Comment: Performed at [facility], [address], [place], [place] [address]  Blood Culture (routine x 2)     Status: None (Preliminary result)   Collection Time: 02/07/17 10:27 PM  Result Value Ref Range Status   Specimen Description BLOOD RIGHT ANTECUBITAL  Final   Special Requests   Final    BOTTLES DRAWN AEROBIC AND ANAEROBIC Blood Culture adequate volume   Culture  Setup Time   Final    GRAM POSITIVE RODS GRAM NEGATIVE COCCOBACILLI ANAEROBIC BOTTLE ONLY CRITICAL RESULT CALLED TO, READ BACK BY AND VERIFIED WITH: J LEGGE,PHARMD AT 9147 02/13/17 BY L BENFIELD    Culture   Final    GRAM POSITIVE RODS BACTEROIDES FRAGILIS BETA LACTAMASE POSITIVE Performed at [facility], [address], [place], [place] [address]    Report Status PENDING  Incomplete  Blood Culture ID Panel (Reflexed)     Status: None   Collection Time: 02/07/17 10:27 PM  Result Value Ref Range Status   Enterococcus species NOT DETECTED NOT DETECTED Final   Listeria  monocytogenes NOT DETECTED NOT DETECTED Final   Staphylococcus species NOT DETECTED NOT DETECTED Final   Staphylococcus aureus NOT DETECTED NOT DETECTED Final   Streptococcus species NOT DETECTED NOT DETECTED Final   Streptococcus agalactiae NOT DETECTED NOT DETECTED Final   Streptococcus pneumoniae NOT DETECTED NOT DETECTED Final   Streptococcus pyogenes NOT DETECTED NOT DETECTED Final   Acinetobacter baumannii NOT DETECTED NOT DETECTED Final   Enterobacteriaceae species NOT DETECTED NOT DETECTED Final   Enterobacter cloacae complex NOT DETECTED NOT DETECTED Final   Escherichia coli NOT DETECTED NOT DETECTED Final   Klebsiella oxytoca NOT DETECTED NOT DETECTED Final   Klebsiella pneumoniae NOT DETECTED NOT DETECTED Final   Proteus species NOT DETECTED NOT DETECTED Final   Serratia marcescens NOT DETECTED NOT DETECTED Final   Haemophilus influenzae NOT DETECTED NOT DETECTED Final   Neisseria meningitidis NOT DETECTED NOT DETECTED Final   Pseudomonas aeruginosa NOT DETECTED NOT DETECTED Final   Candida albicans NOT DETECTED NOT DETECTED Final   Candida glabrata NOT DETECTED NOT DETECTED Final   Candida krusei NOT DETECTED NOT DETECTED Final   Candida parapsilosis NOT DETECTED NOT DETECTED Final   Candida tropicalis NOT DETECTED NOT DETECTED Final    Comment: Performed at [facility], [address] [place], [place] [address]  MRSA PCR Screening     Status: None   Collection Time: 02/08/17  5:29 AM  Result Value Ref Range Status   MRSA by PCR NEGATIVE NEGATIVE Final    Comment:        The GeneXpert MRSA Assay (FDA approved for NASAL specimens only), is one component of a comprehensive MRSA colonization surveillance program. It is not intended to diagnose MRSA infection nor to guide or monitor treatment for MRSA infections.     Radiology Studies: No results found.  Scheduled Meds: . amiodarone  200 mg Oral Daily  . calcium-vitamin D  1 tablet Oral Once per day on Mon  Wed Fri  . docusate sodium  100 mg Oral BID  . famotidine  20 mg Oral BID  . feeding supplement (ENSURE ENLIVE)  237 mL Oral TID BM  . furosemide  20 mg Intravenous Daily  . gabapentin  800 mg Oral TID  . insulin aspart  0-5 Units Subcutaneous QHS  . insulin aspart  0-9 Units Subcutaneous TID WC  . magnesium oxide  400 mg Oral Daily  . midodrine  10 mg Oral TID WC  . multivitamin  1 tablet Oral BID  . nutrition supplement (JUVEN)  1 packet Oral BID BM  . polyethylene glycol  17 g Oral BID  . potassium chloride SA  20 mEq Oral BID  . protein supplement shake  11 oz Oral Q24H  . rivaroxaban  20 mg Oral Q supper  . sodium chloride flush  3 mL Intravenous Q12H  . sodium chloride flush  3 mL Intravenous Q12H  . tamsulosin  0.4 mg Oral Daily  . tiotropium  18 mcg Inhalation Daily   Continuous Infusions: . sodium chloride 250 mL (02/09/17 1900)  . piperacillin-tazobactam (ZOSYN)  IV 3.375 g (02/14/17 1341)  . vancomycin Stopped (02/14/17 0981)     LOS: 7  days   Time spent: 25 minutes   Debbora Presto, MD [facility] Pager [phone]  If 7PM-7AM, please contact night-coverage [email or web address] Password [email or web address] 02/14/2017, 5:15 PM

## 2017-02-14 NOTE — Progress Notes (Signed)
Physical Therapy Treatment Patient Details Name: [name] MRN: [id number] DOB: 1927-01-24 Today's Date: 02/14/2017    History of Present Illness 83 yo male admitted 02/07/17 with sacral wound, s/p debridement. patient with recent [name] , DC'd to SNF 01/28/17.    PT Comments    The patient appears weaker, requiring more assistance for bed mobility. Unable to attempt standing. Noted jerking in the arms today. ROM of the left knee is improving.   Follow Up Recommendations  SNF;LTACH     Equipment Recommendations  None recommended by PT    Recommendations for Other Services       Precautions / Restrictions      Mobility  Bed Mobility Overal bed mobility: Needs Assistance Bed Mobility: Supine to Sit;Sit to Supine;Rolling Rolling: Max assist   Supine to sit: Max assist;+2 for physical assistance;+2 for safety/equipment Sit to supine: Max assist;+2 for physical assistance;+2 for safety/equipment   General bed mobility comments: self assisting with rolling and use of rail, crosses the legs, requires assist with legs and trunk and  scooting to bed edge. Assist trunk and legs back onto bed. Repositioned on right  side in prep for  hydro  Transfers Overall transfer level: Needs assistance               General transfer comment: unable to stand today. Noted jerking of the arms, patient  quiet, BP 99/49, did reprt feeling dizzy  Ambulation/Gait                 Stairs            Wheelchair Mobility    Modified Rankin (Stroke Patients Only)       Balance                                            Cognition Arousal/Alertness: Awake/alert   Overall Cognitive Status: Impaired/Different from baseline                                        Exercises      General Comments        Pertinent Vitals/Pain Pain Assessment: No/denies pain Faces Pain Scale: Hurts little more Pain Location: sacral area Pain  Descriptors / Indicators: Discomfort;Grimacing;Guarding Pain Intervention(s): Repositioned    Home Living                      Prior Function            PT Goals (current goals can now be found in the care plan section) Progress towards PT goals: Not progressing toward goals - comment (appears weaker)    Frequency    Min 3X/week      PT Plan Current plan remains appropriate    Co-evaluation              AM-PAC PT "6 Clicks" Daily Activity  Outcome Measure  Difficulty turning over in bed (including adjusting bedclothes, sheets and blankets)?: Unable Difficulty moving from lying on back to sitting on the side of the bed? : Unable Difficulty sitting down on and standing up from a chair with arms (e.g., wheelchair, bedside commode, etc,.)?: Unable Help needed moving to and from a bed to chair (including a wheelchair)?: Total Help needed walking  in hospital room?: Total Help needed climbing 3-5 steps with a railing? : Total 6 Click Score: 6    End of Session   Activity Tolerance: Patient limited by fatigue;Treatment limited secondary to medical complications (Comment) Patient left: in bed;with call bell/phone within reach Nurse Communication: Mobility status PT Visit Diagnosis: Difficulty in walking, not elsewhere classified (R26.2);Pain;Muscle weakness (generalized) (M62.81) Pain - Right/Left: Right     Time: 1050-1108 PT Time Calculation (min) (ACUTE ONLY): 18 min  Charges:  $Therapeutic Activity: 8-22 mins                    G CodesBlanchard Kelch PT [phone]    [name] 02/14/2017, 12:53 PM

## 2017-02-15 LAB — BASIC METABOLIC PANEL
ANION GAP: 6 (ref 5–15)
BUN: 13 mg/dL (ref 6–20)
CALCIUM: 8.7 mg/dL — AB (ref 8.9–10.3)
CO2: 40 mmol/L — ABNORMAL HIGH (ref 22–32)
Chloride: 90 mmol/L — ABNORMAL LOW (ref 101–111)
Creatinine, Ser: 0.63 mg/dL (ref 0.61–1.24)
Glucose, Bld: 96 mg/dL (ref 65–99)
POTASSIUM: 4.6 mmol/L (ref 3.5–5.1)
SODIUM: 136 mmol/L (ref 135–145)

## 2017-02-15 LAB — CBC
HCT: 26.6 % — ABNORMAL LOW (ref 39.0–52.0)
Hemoglobin: 8.6 g/dL — ABNORMAL LOW (ref 13.0–17.0)
MCH: 31.4 pg (ref 26.0–34.0)
MCHC: 32.3 g/dL (ref 30.0–36.0)
MCV: 97.1 fL (ref 78.0–100.0)
PLATELETS: 435 10*3/uL — AB (ref 150–400)
RBC: 2.74 MIL/uL — AB (ref 4.22–5.81)
RDW: 14.4 % (ref 11.5–15.5)
WBC: 11.3 10*3/uL — AB (ref 4.0–10.5)

## 2017-02-15 LAB — GLUCOSE, CAPILLARY
Glucose-Capillary: 104 mg/dL — ABNORMAL HIGH (ref 65–99)
Glucose-Capillary: 148 mg/dL — ABNORMAL HIGH (ref 65–99)
Glucose-Capillary: 111 mg/dL — ABNORMAL HIGH (ref 65–99)
Glucose-Capillary: 108 mg/dL — ABNORMAL HIGH (ref 65–99)

## 2017-02-15 LAB — CULTURE, BLOOD (ROUTINE X 2): SPECIAL REQUESTS: ADEQUATE

## 2017-02-15 NOTE — Progress Notes (Signed)
Hydrotherapy treatment note  02/15/17 1700  Subjective Assessment  Subjective what do I need to do  Date of Onset (pressure sore on admission, s/P debridement)  Prior Treatments repositioning  Evaluation and Treatment  Evaluation and Treatment Procedures Explained to Patient/Family Yes  Evaluation and Treatment Procedures agreed to  Wound / Incision (Open or Dehisced) 02/09/17 Other (Comment) Sacrum Medial lareg openn  nstage 4 wound, post surgical debridement.  Date First Assessed/Time First Assessed: 02/09/17 1448   Wound Type: Other (Comment)  Location: Sacrum  Location Orientation: Medial  Wound Description (Comments): lareg openn  nstage 4 wound, post surgical debridement.  Dressing Type Gauze (Comment);Moist to dry;ABD (packing strip to tunnel along right gluteus)  Dressing Status Intact;Clean;Dry  Dressing Change Frequency Twice a day  Site / Wound Assessment Dressing in place / Unable to assess  % Wound base Red or Granulating 40%  % Wound base Yellow/Fibrinous Exudate 60%  % Wound base Black/Eschar 20%  Peri-wound Assessment Excoriated;Erythema (blanchable);Bleeding (more excoriation noted, patient soaked in urine.)  Wound Length (cm) 8 cm  Wound Width (cm) 8 cm  Wound Depth (cm) 4 cm  Wound Volume (cm^3) 256 cm^3  Wound Surface Area (cm^2) 64 cm^2  Tunneling (cm) 5 (@ 900,4 cm @500 )  Undermining (cm) 4 ( most of perimeter of wound)  Margins Unattached edges (unapproximated)  Drainage Amount Moderate (dressing soaked in urine)  Drainage Description Serosanguineous  Non-staged Wound Description Full thickness  Treatment Debridement (Selective);Hydrotherapy (Pulse lavage);Packing (Saline gauze);Packing (Plain strip) (p)  Hydrotherapy  Pulsed lavage therapy - wound location sacrum  Pulsed Lavage with Suction (psi) 8 psi  Pulsed Lavage with Suction - Normal Saline Used 1000 mL  Pulsed Lavage Tip Tip with splash shield  Wound Therapy - Assess/Plan/Recommendations  Wound  Therapy - Clinical Statement the patient tolerated PLS. The wound has  2 areas of tunneling and undermining. The bone is felt in the base which is covered with slough.  CCS PA present  to inspect the wound.  Wound Therapy - Functional Problem List The  wounsd continues to slowly improve in characteristics. patient tolerated wel today. continue PT for HYdro  Factors Delaying/Impairing Wound Healing Immobility;Multiple medical problems  Hydrotherapy Plan Debridement;Dressing change;Patient/family education;Pulsatile lavage with suction  Wound Therapy - Frequency 6X / week  Wound Therapy - Current Recommendations PT  Wound Therapy - Follow Up Recommendations Skilled nursing facility [facility])  Wound Plan PLS  and dressing chnages, debridement  Wound Therapy Goals - Improve the function of patient's integumentary system by progressing the wound(s) through the phases of wound healing by:  Decrease Necrotic Tissue to 50  Decrease Necrotic Tissue - Progress Progressing toward goal  Increase Granulation Tissue to 50  Increase Granulation Tissue - Progress Progressing toward goal  Improve Drainage Characteristics Min  Improve Drainage Characteristics - Progress Progressing toward goal  Patient/Family will be able to  reposition off of the sacrum  Patient/Family Instruction Goal - Progress Progressing toward goal  Goals/treatment plan/discharge plan were made with and agreed upon by patient/family Yes  Time For Goal Achievement 2 weeks  Wound Therapy - Potential for Goals [name] PT [phone]

## 2017-02-15 NOTE — Care Management Note (Signed)
Case Management Note  Patient Details  Name: [name] MRN: [id number] Date of Birth: 1926/08/22  Subjective/Objective: Not appropriate for LTACH-d/c plan SNF-CSW already following.                   Action/Plan:d/c plan SNF.   Expected Discharge Date:                  Expected Discharge Plan:  Skilled Nursing Facility  In-House Referral:  Clinical Social Work  Discharge planning Services  CM Consult  Post Acute Care Choice:    Choice offered to:     DME Arranged:    DME Agency:     HH Arranged:    HH Agency:     Status of Service:  In process, will continue to follow  If discussed at Long Length of Stay Meetings, dates discussed:    Additional Comments:  Lanier Clam, RN 02/15/2017, 11:48 AM

## 2017-02-15 NOTE — Progress Notes (Signed)
Patient ID: [name], male   DOB: [birth year], 83 y.o.   MRN: [id number]    PROGRESS NOTE  [name]  NWG:[id number] DOB: 02/19/1927 DOA: 02/07/2017  PCP: [name], MD   Brief Narrative:  Pt is 83 yo male with known paroxysmal a-fib on Xarelto, chronic diastolic CHF, hypotension and recently started on Midodrine, s/p TKR 2 weeks prior to this admission, presented to [facility] ED from SNF with fevers, hypotension, severe pain from sacral ulcer.   Assessment & Plan: Sepsis/septic shock secondary to infected sacral pressure ulcer, ? Osteomyelitis  - stage IV sacral decubitus ulcer with purulent and necrotic debris - s/p debridement of sacral ulcer, final wound 10 x 10 x 4 cm deep with 5 cm tunneling bilaterally into the gluteus, exposed bone/periosteum in the base of the wound, 9/25 (Dr. Fredricka Bonine) - post op day #7 - Tmax 56F in the past 24 hours, WBC is slightly up this am  - continue Zosyn day #8, vanc day #6 - preliminary blood cultures left arm positive for Clostridium and right arm gram + rods and gram - coccobacilli - I have discussed this was ID specialist Dr. Luciana Axe, he recommended prolonged IV treatment with IV ABX for 6 weeks as this could be treated as osteomyelitis, Zosyn can be narrowed down to Rocephin and flagyl prior to discharge  - appreciate surgery team following - continue with hydrotherapy and BID wet to dry dressing changes - repeat CBC in AM  Paroxysmal atrial fibrillation, CHADS-VASc at least 3 - continue Amiodarone and Xarelto  - rate has been controlled so far  Chronic diastolic CHF  - has received hydration initially but IVF have been stopped - pt tolerating diet well - weight trend in the past 72 hours: Filed Weights   02/13/17 0602 02/14/17 0715 02/15/17 0500  Weight: 98 kg (216 lb 0.8 oz) 98.7 kg (217 lb 9.5 oz) 98.5 kg (217 lb 2.5 oz)  - mild pleural effusion on CXR but no pulmonary edema  - cont with lasix IV QD  - I have educated pt on use  of IS, he has done well and encourage to use Q1 hour while awake at least 4-5 time in an hour and he has verbalized understanding   Hypokalemia - supplemented and WNL   Anemia , normocytic - Hg overall stable, no evidence of active bleeding  - CBC in AM  Recent left knee surgery  - cont physical therapy   Hypotension - SBP in 90's - continue Midodrine   Obesity  - Body mass index is 35.37 kg/m.  DVT prophylaxis: Xarelto  Code Status: Full  Family Communication: pt and daughter at bedside  Disposition Plan: will needs SNF but not ready yet, WBC is trending up, T max still 99, final blood cultures pending   Consultants:   Surgery   Procedures:   OR for I&D 9/25   Antimicrobials:   Zosyn 9/25 -->  Vancomycin 9/25 --> 9/25, restarted on 9/27 -->  Subjective: Pt reports feeling better but still with rectal pain, mostly with movement.   Objective: Vitals:   02/15/17 0500 02/15/17 0514 02/15/17 1000 02/15/17 1529  BP:  (!) 122/47 (!) 119/29 (!) 93/36  Pulse:  70 71 81  Resp:  17  18  Temp:  97.7 F (36.5 C) 98.1 F (36.7 C) 99 F (37.2 C)  TempSrc:  Oral Oral Oral  SpO2:  96% 96% 95%  Weight: 98.5 kg (217 lb 2.5 oz)  Height:        Intake/Output Summary (Last 24 hours) at 02/15/17 1818 Last data filed at 02/15/17 1800  Gross per 24 hour  Intake              973 ml  Output             1500 ml  Net             -527 ml   Filed Weights   02/13/17 0602 02/14/17 0715 02/15/17 0500  Weight: 98 kg (216 lb 0.8 oz) 98.7 kg (217 lb 9.5 oz) 98.5 kg (217 lb 2.5 oz)    Physical Exam  Constitutional: Appears well-developed and well-nourished. No distress.  CVS: RRR, S1/S2 +, no murmurs, no gallops, no carotid bruit.  Pulmonary: Effort and breath sounds normal, diminished breath sounds at bases  Abdominal: Soft. BS +,  no distension, tenderness, rebound or guarding.  Musculoskeletal: Normal range of motion. Non pitting edema in lower extremities, surgical scar  on the left  knee clean and dry  Neuro: Alert. Normal reflexes, muscle tone coordination. No cranial nerve deficit.  Data Reviewed: I have personally reviewed following labs and imaging studies  CBC:  Recent Labs Lab 02/09/17 0330  02/11/17 0452 02/12/17 0457 02/13/17 0622 02/14/17 0530 02/15/17 0512  WBC 16.1*  < > 17.0* 15.1* 13.3* 10.1 11.3*  NEUTROABS 14.2*  --   --   --   --   --   --   HGB 8.9*  < > 8.6* 8.6* 8.2* 8.4* 8.6*  HCT 26.2*  < > 25.9* 26.5* 25.1* 26.3* 26.6*  MCV 93.2  < > 94.5 97.1 96.5 95.6 97.1  PLT 468*  < > 513* 506* 508* 467* 435*  < > = values in this interval not displayed. Basic Metabolic Panel:  Recent Labs Lab 02/09/17 0330  02/11/17 0452 02/12/17 0457 02/13/17 0622 02/14/17 0530 02/15/17 0512  NA 139  < > 139 139 139 138 136  K 3.9  < > 4.6 5.0 4.4 4.4 4.6  CL 102  < > 102 101 96* 93* 90*  CO2 26  < > 32 32 37* 40* 40*  GLUCOSE 102*  < > 94 94 98 107* 96  BUN 14  < > CREATININE 0.67  < > 0.74 0.65 0.57* 0.61 0.63  CALCIUM 8.2*  < > 8.7* 8.5* 8.4* 8.7* 8.7*  MG 2.1  --   --   --   --   --   --   < > = values in this interval not displayed. Liver Function Tests:  Recent Labs Lab 02/09/17 0330  AST 33  ALT 38  ALKPHOS 90  BILITOT 0.7  PROT 5.4*  ALBUMIN 2.2*   CBG:  Recent Labs Lab 02/14/17 1710 02/14/17 2110 02/15/17 0729 02/15/17 1138 02/15/17 1713  GLUCAP 110* 138* 104* 148* 111*   Urine analysis:    Component Value Date/Time   COLORURINE YELLOW 02/07/2017 2158   APPEARANCEUR HAZY (A) 02/07/2017 2158   LABSPEC 1.010 02/07/2017 2158   PHURINE 5.0 02/07/2017 2158   GLUCOSEU NEGATIVE 02/07/2017 2158   HGBUR NEGATIVE 02/07/2017 2158   BILIRUBINUR NEGATIVE 02/07/2017 2158   KETONESUR NEGATIVE 02/07/2017 2158   PROTEINUR NEGATIVE 02/07/2017 2158   UROBILINOGEN 0.2 08/06/2013 1006   NITRITE NEGATIVE 02/07/2017 2158   LEUKOCYTESUR SMALL (A) 02/07/2017 2158   Recent Results (from the past 240 hour(s))   Urine culture  Status: Abnormal   Collection Time: 02/07/17  9:59 PM  Result Value Ref Range Status   Specimen Description URINE, CLEAN CATCH  Final   Special Requests NONE  Final   Culture (A)  Final    <10,000 COLONIES/mL INSIGNIFICANT GROWTH Performed at [facility], [address] [place], [place] [address]    Report Status 02/09/2017 FINAL  Final  Wound or Superficial Culture     Status: Abnormal   Collection Time: 02/07/17 10:04 PM  Result Value Ref Range Status   Specimen Description BACK  Final   Special Requests NONE  Final   Gram Stain   Final    FEW WBC PRESENT, PREDOMINANTLY PMN MODERATE GRAM NEGATIVE RODS MODERATE GRAM POSITIVE COCCI FEW GRAM POSITIVE RODS Performed at [facility], [address] [place], [place] [address]    Culture MULTIPLE ORGANISMS PRESENT, NONE PREDOMINANT (A)  Final   Report Status 02/10/2017 FINAL  Final  Blood Culture (routine x 2)     Status: Abnormal   Collection Time: 02/07/17 10:20 PM  Result Value Ref Range Status   Specimen Description BLOOD LEFT FOREARM  Final   Special Requests   Final    BOTTLES DRAWN AEROBIC AND ANAEROBIC Blood Culture adequate volume   Culture  Setup Time   Final    GRAM POSITIVE RODS ANAEROBIC BOTTLE ONLY CRITICAL RESULT CALLED TO, READ BACK BY AND VERIFIED WITH: JSusa Day PHARMD, AT 1008 02/09/17 BY D. VANHOOK Performed at [facility], [address], [place], [place] [address]    Culture CLOSTRIDIUM RAMOSUM (A)  Final   Report Status 02/12/2017 FINAL  Final  Blood Culture ID Panel (Reflexed)     Status: None   Collection Time: 02/07/17 10:20 PM  Result Value Ref Range Status   Enterococcus species NOT DETECTED NOT DETECTED Final   Listeria monocytogenes NOT DETECTED NOT DETECTED Final   Staphylococcus species NOT DETECTED NOT DETECTED Final   Staphylococcus aureus NOT DETECTED NOT DETECTED Final   Streptococcus species NOT DETECTED NOT DETECTED Final   Streptococcus agalactiae  NOT DETECTED NOT DETECTED Final   Streptococcus pneumoniae NOT DETECTED NOT DETECTED Final   Streptococcus pyogenes NOT DETECTED NOT DETECTED Final   Acinetobacter baumannii NOT DETECTED NOT DETECTED Final   Enterobacteriaceae species NOT DETECTED NOT DETECTED Final   Enterobacter cloacae complex NOT DETECTED NOT DETECTED Final   Escherichia coli NOT DETECTED NOT DETECTED Final   Klebsiella oxytoca NOT DETECTED NOT DETECTED Final   Klebsiella pneumoniae NOT DETECTED NOT DETECTED Final   Proteus species NOT DETECTED NOT DETECTED Final   Serratia marcescens NOT DETECTED NOT DETECTED Final   Haemophilus influenzae NOT DETECTED NOT DETECTED Final   Neisseria meningitidis NOT DETECTED NOT DETECTED Final   Pseudomonas aeruginosa NOT DETECTED NOT DETECTED Final   Candida albicans NOT DETECTED NOT DETECTED Final   Candida glabrata NOT DETECTED NOT DETECTED Final   Candida krusei NOT DETECTED NOT DETECTED Final   Candida parapsilosis NOT DETECTED NOT DETECTED Final   Candida tropicalis NOT DETECTED NOT DETECTED Final    Comment: Performed at [facility], [address], [place], [place] [address]  Blood Culture (routine x 2)     Status: None   Collection Time: 02/07/17 10:27 PM  Result Value Ref Range Status   Specimen Description BLOOD RIGHT ANTECUBITAL  Final   Special Requests   Final    BOTTLES DRAWN AEROBIC AND ANAEROBIC Blood Culture adequate volume  Culture  Setup Time   Final    GRAM POSITIVE RODS GRAM NEGATIVE COCCOBACILLI ANAEROBIC BOTTLE ONLY CRITICAL RESULT CALLED TO, READ BACK BY AND VERIFIED WITH: J LEGGE,PHARMD AT 1610 02/13/17 BY L BENFIELD    Culture   Final    GRAM POSITIVE RODS BACTEROIDES FRAGILIS BETA LACTAMASE POSITIVE GRAM POSITIVE RODS NOT RECOVERED IN CULTURE Performed at [facility], [address], [place], [place] [address]    Report Status 02/15/2017 FINAL  Final  Blood Culture ID Panel (Reflexed)     Status: None   Collection Time: 02/07/17  10:27 PM  Result Value Ref Range Status   Enterococcus species NOT DETECTED NOT DETECTED Final   Listeria monocytogenes NOT DETECTED NOT DETECTED Final   Staphylococcus species NOT DETECTED NOT DETECTED Final   Staphylococcus aureus NOT DETECTED NOT DETECTED Final   Streptococcus species NOT DETECTED NOT DETECTED Final   Streptococcus agalactiae NOT DETECTED NOT DETECTED Final   Streptococcus pneumoniae NOT DETECTED NOT DETECTED Final   Streptococcus pyogenes NOT DETECTED NOT DETECTED Final   Acinetobacter baumannii NOT DETECTED NOT DETECTED Final   Enterobacteriaceae species NOT DETECTED NOT DETECTED Final   Enterobacter cloacae complex NOT DETECTED NOT DETECTED Final   Escherichia coli NOT DETECTED NOT DETECTED Final   Klebsiella oxytoca NOT DETECTED NOT DETECTED Final   Klebsiella pneumoniae NOT DETECTED NOT DETECTED Final   Proteus species NOT DETECTED NOT DETECTED Final   Serratia marcescens NOT DETECTED NOT DETECTED Final   Haemophilus influenzae NOT DETECTED NOT DETECTED Final   Neisseria meningitidis NOT DETECTED NOT DETECTED Final   Pseudomonas aeruginosa NOT DETECTED NOT DETECTED Final   Candida albicans NOT DETECTED NOT DETECTED Final   Candida glabrata NOT DETECTED NOT DETECTED Final   Candida krusei NOT DETECTED NOT DETECTED Final   Candida parapsilosis NOT DETECTED NOT DETECTED Final   Candida tropicalis NOT DETECTED NOT DETECTED Final    Comment: Performed at [facility], [address], [place], [place] [address]  MRSA PCR Screening     Status: None   Collection Time: 02/08/17  5:29 AM  Result Value Ref Range Status   MRSA by PCR NEGATIVE NEGATIVE Final    Comment:        The GeneXpert MRSA Assay (FDA approved for NASAL specimens only), is one component of a comprehensive MRSA colonization surveillance program. It is not intended to diagnose MRSA infection nor to guide or monitor treatment for MRSA infections.     Radiology Studies: No results  found.  Scheduled Meds: . amiodarone  200 mg Oral Daily  . calcium-vitamin D  1 tablet Oral Once per day on Mon Wed Fri  . docusate sodium  100 mg Oral BID  . famotidine  20 mg Oral BID  . feeding supplement (ENSURE ENLIVE)  237 mL Oral TID BM  . furosemide  20 mg Intravenous Daily  . gabapentin  800 mg Oral TID  . insulin aspart  0-5 Units Subcutaneous QHS  . insulin aspart  0-9 Units Subcutaneous TID WC  . magnesium oxide  400 mg Oral Daily  . midodrine  10 mg Oral TID WC  . multivitamin  1 tablet Oral BID  . nutrition supplement (JUVEN)  1 packet Oral BID BM  . polyethylene glycol  17 g Oral BID  . potassium chloride SA  20 mEq Oral BID  . protein supplement shake  11 oz Oral Q24H  . rivaroxaban  20 mg Oral Q supper  .  sodium chloride flush  3 mL Intravenous Q12H  . sodium chloride flush  3 mL Intravenous Q12H  . tamsulosin  0.4 mg Oral Daily  . tiotropium  18 mcg Inhalation Daily   Continuous Infusions: . sodium chloride 250 mL (02/09/17 1900)  . piperacillin-tazobactam (ZOSYN)  IV Stopped (02/15/17 1713)  . vancomycin 750 mg (02/15/17 1717)     LOS: 8 days   Time spent: 25 minutes   Debbora Presto, MD [facility] Pager [phone]  If 7PM-7AM, please contact night-coverage [email or web address] Password [email or web address] 02/15/2017, 6:18 PM

## 2017-02-15 NOTE — Progress Notes (Signed)
CSW spoke with patient's MD and confirmed that patient will dc with hydrotherapy. CSW confirmed with patient's bed offers that they offered hydrotherapy, only [facility] SNF reported that they offered hydrotherapy.   CSW provided bed offers to patient, patient requested that CSW contact his daughter. CSW contacted patient's daughter and provided bed offers. Patient's daughter reported that she prefers [facility]. [facility] pending on offer. CSW contacted [facility] and inquired ability to offer patient a bed, CSW awaiting return phone call.   CSW will continue to follow and assist patient with discharge planning.   Celso Sickle, [place] Clinical Social Worker [facility] Cell#: [phone]

## 2017-02-16 LAB — CBC
HCT: 26.2 % — ABNORMAL LOW (ref 39.0–52.0)
Hemoglobin: 8.6 g/dL — ABNORMAL LOW (ref 13.0–17.0)
MCH: 31.7 pg (ref 26.0–34.0)
MCHC: 32.8 g/dL (ref 30.0–36.0)
MCV: 96.7 fL (ref 78.0–100.0)
PLATELETS: 416 10*3/uL — AB (ref 150–400)
RBC: 2.71 MIL/uL — ABNORMAL LOW (ref 4.22–5.81)
RDW: 14.5 % (ref 11.5–15.5)
WBC: 11.6 10*3/uL — AB (ref 4.0–10.5)

## 2017-02-16 LAB — GLUCOSE, CAPILLARY
Glucose-Capillary: 102 mg/dL — ABNORMAL HIGH (ref 65–99)
Glucose-Capillary: 108 mg/dL — ABNORMAL HIGH (ref 65–99)
Glucose-Capillary: 106 mg/dL — ABNORMAL HIGH (ref 65–99)
Glucose-Capillary: 145 mg/dL — ABNORMAL HIGH (ref 65–99)

## 2017-02-16 LAB — BASIC METABOLIC PANEL
ANION GAP: 9 (ref 5–15)
BUN: 19 mg/dL (ref 6–20)
CALCIUM: 8.7 mg/dL — AB (ref 8.9–10.3)
CO2: 35 mmol/L — AB (ref 22–32)
CREATININE: 0.72 mg/dL (ref 0.61–1.24)
Chloride: 91 mmol/L — ABNORMAL LOW (ref 101–111)
GFR calc Af Amer: >60 mL/min (ref >60)
Glucose, Bld: 100 mg/dL — ABNORMAL HIGH (ref 65–99)
Potassium: 4.8 mmol/L (ref 3.5–5.1)
Sodium: 135 mmol/L (ref 135–145)

## 2017-02-16 NOTE — Progress Notes (Signed)
Pharmacy Antibiotic Note  [name] is a 83 y.o. male admitted on 02/07/2017 with cellulitis, sacral ulcer with purulent drainage and necrotic eschar.  Pharmacy was initially consulted for Vancomycin and Zosyn dosing.  Antibiotics were narrowed to Zosyn alone on 9/25, however, now pharmacy is consulted to resume Vancomycin dosing for broader coverage due to increasing WBC and possible osteo.  S/p debridement on 9/25.  Today, 02/16/2017: Day #9 zosyn, Day #6 vancomycin  Vancomycin trough = 75mcg/mL on 9/30, recheck in am to monitor for accumulation  SCr stable - Slight trend up (? Significance)  WBC slightly elevated, stable  Afebrile  9/24 Bcx 1/2: clostridium ramosum, 1/2 updated to b. fragilis  Plan:  Continue Zosyn 3.375g IV Q8H infused over 4hrs.  Vancomycin  750 mg IV q12h - vancomycin trough in am (15-82mcg/mL for possible osteomyelitis)  Daily SCr d/t nephrotoxicity of current regimen  Follow up renal fxn, culture results, and clinical course.  TRH has d/w ID antibiotic plan - ceftriaxone + metronidazole at d/c to compelte 6 weeks, TRH to confirm whether vancomycin to be part of this regimen  Height: 5\' 6"  (167.6 cm) Weight: 215 lb 13.3 oz (97.9 kg) IBW/kg (Calculated) : 63.8  Temp (24hrs), Avg:98.4 F (36.9 C), Min:97.8 F (36.6 C), Max:99 F (37.2 C)   Recent Labs Lab 02/12/17 0457 02/13/17 0622 02/14/17 0530 02/15/17 0512 02/16/17 0512  WBC 15.1* 13.3* 10.1 11.3* 11.6*  CREATININE 0.65 0.57* 0.61 0.63 0.72  VANCOTROUGH  --  15  --   --   --     Estimated Creatinine Clearance: 71.2 mL/min (by C-G formula based on SCr of 0.72 mg/dL).    Allergies  Allergen Reactions  . Nsaids     Stomach pain  . Vicodin [Hydrocodone-Acetaminophen] Other (See Comments)    Unknown reaction many years ago  . Morphine And Related Nausea And Vomiting    Dizziness, light headed. "Opiates cause tightness in chest".    Antimicrobials this admission:  9/24 Vancomycin  >> 9/25, 9/28 >> 9/24 Zosyn >>   Levels:  9/30 0622 VT = 15 mcg/mL on 750g q12h (prior to 5th dose) 10/4 0530 VT = on 750mg  IV q12h   Microbiology results:  9/24 BCx x2: 1/2 Clostridium ramosum,                        1/2 Bacteroides fragilis beta lactamase positive 9/24 UCx: insignificant growth 9/24 back wound: multiple organisms, none predominant 9/25 MRSA PCR: neg  Thank you for allowing pharmacy to be a part of this patient's care.  Juliette Alcide, PharmD, BCPS.   Pager: [phone] 02/16/2017 10:51 AM

## 2017-02-16 NOTE — Progress Notes (Signed)
8 Days Post-Op    CC:  Decubitus  Subjective: Wants everyone to talk to his daughter, he is having loose stools and it took some time to clean him up.  With current dressing stool was not in the wound.    Objective: Vital signs in last 24 hours: Temp:  [97.8 F (36.6 C)-99 F (37.2 C)] 97.8 F (36.6 C) (10/03 0515) Pulse Rate:  [72-81] 78 (10/03 0515) Resp:  [18] 18 (10/03 0515) BP: (93-111)/(36-43) 106/43 (10/03 0515) SpO2:  [93 %-99 %] 93 % (10/03 0515) Weight:  [97.9 kg (215 lb 13.3 oz)] 97.9 kg (215 lb 13.3 oz) (10/03 0500) Last BM Date: 02/15/17  Intake/Output from previous day: 10/02 0701 - 10/03 0700 In: 893.2 [P.O.:480; I.V.:163.2; IV Piggyback:250] Out: 600 [Urine:600] Intake/Output this shift: Total I/O In: -  Out: 2500 [Urine:2500]  General appearance: alert, cooperative and no distress Skin: Skin color, texture, turgor normal. No rashes or lesions or open wound is 7 x 7 x 4 cm.  He has some undermining, but this is improving.   Afebrile, VSS Labs stable   7 x 7 x 4 cm current measurements   Lab Results:   Recent Labs  02/15/17 0512 02/16/17 0512  WBC 11.3* 11.6*  HGB 8.6* 8.6*  HCT 26.6* 26.2*  PLT 435* 416*    BMET  Recent Labs  02/15/17 0512 02/16/17 0512  NA 136 135  K 4.6 4.8  CL 90* 91*  CO2 40* 35*  GLUCOSE 96 100*  BUN 13 19  CREATININE 0.63 0.72  CALCIUM 8.7* 8.7*   PT/INR No results for input(s): LABPROT, INR in the last 72 hours.  No results for input(s): AST, ALT, ALKPHOS, BILITOT, PROT, ALBUMIN in the last 168 hours.   Lipase     Component Value Date/Time   LIPASE 23.0 12/30/2011 1000     Medications: . amiodarone  200 mg Oral Daily  . calcium-vitamin D  1 tablet Oral Once per day on Mon Wed Fri  . docusate sodium  100 mg Oral BID  . famotidine  20 mg Oral BID  . feeding supplement (ENSURE ENLIVE)  237 mL Oral TID BM  . furosemide  20 mg Intravenous Daily  . gabapentin  800 mg Oral TID  . insulin aspart  0-5  Units Subcutaneous QHS  . insulin aspart  0-9 Units Subcutaneous TID WC  . magnesium oxide  400 mg Oral Daily  . midodrine  10 mg Oral TID WC  . multivitamin  1 tablet Oral BID  . nutrition supplement (JUVEN)  1 packet Oral BID BM  . polyethylene glycol  17 g Oral BID  . potassium chloride SA  20 mEq Oral BID  . protein supplement shake  11 oz Oral Q24H  . rivaroxaban  20 mg Oral Q supper  . sodium chloride flush  3 mL Intravenous Q12H  . sodium chloride flush  3 mL Intravenous Q12H  . tamsulosin  0.4 mg Oral Daily  . tiotropium  18 mcg Inhalation Daily    Assessment/Plan PAF - on xarelto Chronic diastolic CHF (ECHO 08/2015 EF 50-55%) Hypotension on midrin S/p left TKA 01/24/17 Code status DNR  Stage IV sacral decubitus ulcer with purulent and necrotic debris S/p debridement of sacral decubitus ulcer, stage IV, final wound 10x10x4cm deep with 5cm tunneling bilaterally into the gluteus, exposed bone/periosteum in base of wound 9/25 Dr. Fredricka Bonine - POD 8 - undergoing hydrotherapy - WBC stabel at 11.6, TMAX 99.3 - having loose stools  ID - vancomycin restarted 9/28 =>>day 4,zosyn 9/25>>day#4, vancomycin 9/24 =>> day 7 FEN - heart healthy diet VTE - SCDs, xarelto Foley - none Follow up - wound clinic   Plan:  Ongoing therapy, it took 3 people to turn him and clean him up.  He will need al high level of SNF with hydrotherapy to keep this from further breakdown.      LOS: 9 days    Cotey Rakes 02/16/2017 [phone]

## 2017-02-16 NOTE — Care Management Important Message (Signed)
Important Message  Patient Details  Name: DONNEL JOBIN MRN: [id number] Date of Birth: November 15, 1926   Medicare Important Message Given:  Yes    Caren Macadam 02/16/2017, 1:06 PMImportant Message  Patient Details  Name: KALUM WALLAR MRN: [id number] Date of Birth: [birth year]   Medicare Important Message Given:  Yes    Caren Macadam 02/16/2017, 1:05 PM

## 2017-02-16 NOTE — Progress Notes (Signed)
CSW contacted by [facility] and informed that they are not able to offer patient a bed.  CSW provided update to patient's daughter. Patient's daughter selected [facility] SNF.  CSW provided update to patient's insurance. CSW spoke with [facility] staff member Angelique who reported that they would close the authorization and it would need to be restarted when patient is medically stable for discharge.   CSW will continue to follow and assist with discharge planning.  Celso Sickle, [place] Clinical Social Worker [facility] Cell#: [phone]

## 2017-02-16 NOTE — Plan of Care (Signed)
Problem: Bowel/Gastric: Goal: Will not experience complications related to bowel motility Outcome: Progressing Pt had BM on day shift

## 2017-02-16 NOTE — Progress Notes (Signed)
Hydrotherapy treatment note.  02/16/17 1232  Subjective Assessment  Subjective Did you call my daughter?  Prior Treatments repositioning  Evaluation and Treatment  Evaluation and Treatment Procedures Explained to Patient/Family Yes  Evaluation and Treatment Procedures agreed to  Wound / Incision (Open or Dehisced) 02/09/17 Other (Comment) Sacrum Medial lareg openn  nstage 4 wound, post surgical debridement.  Date First Assessed/Time First Assessed: 02/09/17 1448   Wound Type: Other (Comment)  Location: Sacrum  Location Orientation: Medial  Wound Description (Comments): lareg openn  nstage 4 wound, post surgical debridement.  Dressing Type Gauze (Comment);Moist to dry;ABD  Dressing Changed New  Dressing Status Intact  Dressing Change Frequency Twice a day  Site / Wound Assessment Dressing in place / Unable to assess  % Wound base Red or Granulating 40%  % Wound base Yellow/Fibrinous Exudate 60%  % Wound base Black/Eschar 20%  Peri-wound Assessment Excoriated;Erythema (blanchable)  Wound Length (cm) 7 cm  Wound Width (cm) 7 cm  Wound Depth (cm) 7 cm  Wound Volume (cm^3) 343 cm^3  Wound Surface Area (cm^2) 49 cm^2  Tunneling (cm) 4  Margins Unattached edges (unapproximated)  Drainage Amount Moderate  Drainage Description Serosanguineous  Non-staged Wound Description Full thickness  Treatment Hydrotherapy (Pulse lavage);Packing (Plain strip);Packing (Saline gauze)  Hydrotherapy  Pulsed lavage therapy - wound location sacrum  Pulsed Lavage with Suction (psi) 8 psi  Pulsed Lavage with Suction - Normal Saline Used 1000 mL  Pulsed Lavage Tip Tip with splash shield  Wound Therapy - Assess/Plan/Recommendations  Wound Therapy - Clinical Statement Will, PA from surgery in to inspect. Recommends to continue PLS. Patient with large amount of loose BM prior to tratment. Concern now if loose BM and contamination. will need to be checked for stool frequently. Tolerates well when premedicated.   Factors Delaying/Impairing Wound Healing Immobility;Multiple medical problems  Hydrotherapy Plan Debridement;Dressing change;Patient/family education;Pulsatile lavage with suction  Wound Therapy - Frequency 6X / week  Wound Therapy - Current Recommendations PT  Wound Therapy - Follow Up Recommendations Skilled nursing facility  Wound Plan PLS  and dressing chnages, debridement  Wound Therapy Goals - Improve the function of patient's integumentary system by progressing the wound(s) through the phases of wound healing by:  Decrease Necrotic Tissue to 50  Decrease Necrotic Tissue - Progress Progressing toward goal  Increase Granulation Tissue to 50  Increase Granulation Tissue - Progress Progressing toward goal  Decrease Length/Width/Depth - Progress Progressing toward goal  Improve Drainage Characteristics Min  Improve Drainage Characteristics - Progress Progressing toward goal  Patient/Family will be able to  reposition off of the sacrum  Patient/Family Instruction Goal - Progress Progressing toward goal  Goals/treatment plan/discharge plan were made with and agreed upon by patient/family Yes  Time For Goal Achievement 2 weeks  Wound Therapy - Potential for Goals [name] PT [phone]

## 2017-02-16 NOTE — Progress Notes (Signed)
Patient ID: [name], male   DOB: Oct 14, 1926, 83 y.o.   MRN: [id number]    PROGRESS NOTE  [name]  WUJ:[id number] DOB: [birth year] DOA: 02/07/2017  PCP: [name], MD   Brief Narrative:  Pt is 83 yo male with known paroxysmal a-fib on Xarelto, chronic diastolic CHF, hypotension and recently started on Midodrine, s/p TKR 2 weeks prior to this admission, presented to [facility] ED from SNF with fevers, hypotension, severe pain from sacral ulcer.   Assessment & Plan: Sepsis/septic shock secondary to infected sacral pressure ulcer, ? Osteomyelitis  - stage IV sacral decubitus ulcer with purulent and necrotic debris - s/p debridement of sacral ulcer, final wound 10 x 10 x 4 cm deep with 5 cm tunneling bilaterally into the gluteus, exposed bone/periosteum in the base of the wound, 9/25 (Dr. Fredricka Bonine) - post op day #8 - Tmax 20F in the past 24 hours, WBC is slightly up this am  - continue Zosyn day #9, vanc day #7 - preliminary blood cultures left arm positive for Clostridium and right arm gram + rods and gram - coccobacilli - I have discussed this was ID specialist Dr. Luciana Axe, he recommended prolonged IV treatment with IV ABX for 6 weeks as this could be treated as osteomyelitis, Zosyn can be narrowed down to Rocephin and flagyl prior to discharge  - appreciate surgery team following - continue with hydrotherapy and BID wet to dry dressing changes - repeat CBC in AM  Paroxysmal atrial fibrillation, CHADS-VASc at least 3 - continue Amiodarone and Xarelto  - rate controlled   Chronic diastolic CHF  - has received hydration initially but IVF have been stopped - pt tolerating diet well - weight trend in the past 72 hours: Filed Weights   02/14/17 0715 02/15/17 0500 02/16/17 0500  Weight: 98.7 kg (217 lb 9.5 oz) 98.5 kg (217 lb 2.5 oz) 97.9 kg (215 lb 13.3 oz)  - mild pleural effusion on CXR but no pulmonary edema  - cont with lasix IV QD  - I have educated pt on use of IS, he  has done well and encourage to use Q1 hour while awake at least 4-5 time in an hour and he has verbalized understanding   Hypokalemia - supplemented   Anemia , normocytic - Hg overall stable, no evidence of active bleeding  - CBC in AM  Recent left knee surgery  - cont physical therapy   Hypotension - SBP in 90's - continue Midodrine   Obesity  - Body mass index is 35.37 kg/m.  DVT prophylaxis: Xarelto  Code Status: Full  Family Communication: pt and daughter at bedside  Disposition Plan: will needs SNF but not ready yet, WBC is trending up, T max still 99, final blood cultures pending   Consultants:   Surgery   Procedures:   OR for I&D 9/25   Antimicrobials:   Zosyn 9/25 -->  Vancomycin 9/25 --> 9/25, restarted on 9/27 -->  Subjective: Pt denies concerns this AM.   Objective: Vitals:   02/15/17 2010 02/16/17 0500 02/16/17 0515 02/16/17 1352  BP: (!) 111/41  (!) 106/43 (!) 115/43  Pulse: 72  78 74  Resp: Temp: 98.5 F (36.9 C)  97.8 F (36.6 C) 98.3 F (36.8 C)  TempSrc: Oral  Oral Oral  SpO2: 99%  93% 100%  Weight:  97.9 kg (215 lb 13.3 oz)    Height:        Intake/Output Summary (Last  24 hours) at 02/16/17 1713 Last data filed at 02/16/17 1116  Gross per 24 hour  Intake           330.17 ml  Output             2800 ml  Net         -2469.83 ml   Filed Weights   02/14/17 0715 02/15/17 0500 02/16/17 0500  Weight: 98.7 kg (217 lb 9.5 oz) 98.5 kg (217 lb 2.5 oz) 97.9 kg (215 lb 13.3 oz)   Physical Exam  Constitutional: Appears NAD, calm CVS: RRR, S1/S2 +, no gallops, no carotid bruit.  Pulmonary: Effort and breath sounds normal, no stridor, rhonchi, wheezes, rales.  Abdominal: Soft. BS +,  no distension, tenderness, rebound or guarding.  Musculoskeletal: Normal range of motion.  Data Reviewed: I have personally reviewed following labs and imaging studies  CBC:  Recent Labs Lab 02/12/17 0457 02/13/17 0622 02/14/17 0530  02/15/17 0512 02/16/17 0512  WBC 15.1* 13.3* 10.1 11.3* 11.6*  HGB 8.6* 8.2* 8.4* 8.6* 8.6*  HCT 26.5* 25.1* 26.3* 26.6* 26.2*  MCV 97.1 96.5 95.6 97.1 96.7  PLT 506* 508* 467* 435* 416*   Basic Metabolic Panel:  Recent Labs Lab 02/12/17 0457 02/13/17 0622 02/14/17 0530 02/15/17 0512 02/16/17 0512  NA 139 139 138 136 135  K 5.0 4.4 4.4 4.6 4.8  CL 101 96* 93* 90* 91*  CO2 32 37* 40* 40* 35*  GLUCOSE 94 98 107* 96 100*  BUN CREATININE 0.65 0.57* 0.61 0.63 0.72  CALCIUM 8.5* 8.4* 8.7* 8.7* 8.7*   CBG:  Recent Labs Lab 02/15/17 1713 02/15/17 2041 02/16/17 0735 02/16/17 1114 02/16/17 1652  GLUCAP 111* 108* 102* 108* 106*   Urine analysis:    Component Value Date/Time   COLORURINE YELLOW 02/07/2017 2158   APPEARANCEUR HAZY (A) 02/07/2017 2158   LABSPEC 1.010 02/07/2017 2158   PHURINE 5.0 02/07/2017 2158   GLUCOSEU NEGATIVE 02/07/2017 2158   HGBUR NEGATIVE 02/07/2017 2158   BILIRUBINUR NEGATIVE 02/07/2017 2158   KETONESUR NEGATIVE 02/07/2017 2158   PROTEINUR NEGATIVE 02/07/2017 2158   UROBILINOGEN 0.2 08/06/2013 1006   NITRITE NEGATIVE 02/07/2017 2158   LEUKOCYTESUR SMALL (A) 02/07/2017 2158   Recent Results (from the past 240 hour(s))  Urine culture     Status: Abnormal   Collection Time: 02/07/17  9:59 PM  Result Value Ref Range Status   Specimen Description URINE, CLEAN CATCH  Final   Special Requests NONE  Final   Culture (A)  Final    <10,000 COLONIES/mL INSIGNIFICANT GROWTH Performed at [facility], [address], [place], [place] [address]    Report Status 02/09/2017 FINAL  Final  Wound or Superficial Culture     Status: Abnormal   Collection Time: 02/07/17 10:04 PM  Result Value Ref Range Status   Specimen Description BACK  Final   Special Requests NONE  Final   Gram Stain   Final    FEW WBC PRESENT, PREDOMINANTLY PMN MODERATE GRAM NEGATIVE RODS MODERATE GRAM POSITIVE COCCI FEW GRAM POSITIVE RODS Performed at [facility], [address], [place], [place] [address]    Culture MULTIPLE ORGANISMS PRESENT, NONE PREDOMINANT (A)  Final   Report Status 02/10/2017 FINAL  Final  Blood Culture (routine x 2)     Status: Abnormal   Collection Time: 02/07/17 10:20 PM  Result Value Ref Range Status   Specimen Description BLOOD LEFT FOREARM  Final   Special Requests   Final    BOTTLES DRAWN AEROBIC AND ANAEROBIC Blood Culture adequate volume   Culture  Setup Time   Final    GRAM POSITIVE RODS ANAEROBIC BOTTLE ONLY CRITICAL RESULT CALLED TO, READ BACK BY AND VERIFIED WITH: J. LEGGE PHARMD, AT 1008 02/09/17 BY D. VANHOOK Performed at [facility], [address] [place], [place] [address]    Culture CLOSTRIDIUM RAMOSUM (A)  Final   Report Status 02/12/2017 FINAL  Final  Blood Culture ID Panel (Reflexed)     Status: None   Collection Time: 02/07/17 10:20 PM  Result Value Ref Range Status   Enterococcus species NOT DETECTED NOT DETECTED Final   Listeria monocytogenes NOT DETECTED NOT DETECTED Final   Staphylococcus species NOT DETECTED NOT DETECTED Final   Staphylococcus aureus NOT DETECTED NOT DETECTED Final   Streptococcus species NOT DETECTED NOT DETECTED Final   Streptococcus agalactiae NOT DETECTED NOT DETECTED Final   Streptococcus pneumoniae NOT DETECTED NOT DETECTED Final   Streptococcus pyogenes NOT DETECTED NOT DETECTED Final   Acinetobacter baumannii NOT DETECTED NOT DETECTED Final   Enterobacteriaceae species NOT DETECTED NOT DETECTED Final   Enterobacter cloacae complex NOT DETECTED NOT DETECTED Final   Escherichia coli NOT DETECTED NOT DETECTED Final   Klebsiella oxytoca NOT DETECTED NOT DETECTED Final   Klebsiella pneumoniae NOT DETECTED NOT DETECTED Final   Proteus species NOT DETECTED NOT DETECTED Final   Serratia marcescens NOT DETECTED NOT DETECTED Final   Haemophilus influenzae NOT DETECTED NOT DETECTED Final   Neisseria meningitidis NOT DETECTED NOT DETECTED Final    Pseudomonas aeruginosa NOT DETECTED NOT DETECTED Final   Candida albicans NOT DETECTED NOT DETECTED Final   Candida glabrata NOT DETECTED NOT DETECTED Final   Candida krusei NOT DETECTED NOT DETECTED Final   Candida parapsilosis NOT DETECTED NOT DETECTED Final   Candida tropicalis NOT DETECTED NOT DETECTED Final    Comment: Performed at [facility], [address], [place], [place] [address]  Blood Culture (routine x 2)     Status: None   Collection Time: 02/07/17 10:27 PM  Result Value Ref Range Status   Specimen Description BLOOD RIGHT ANTECUBITAL  Final   Special Requests   Final    BOTTLES DRAWN AEROBIC AND ANAEROBIC Blood Culture adequate volume   Culture  Setup Time   Final    GRAM POSITIVE RODS GRAM NEGATIVE COCCOBACILLI ANAEROBIC BOTTLE ONLY CRITICAL RESULT CALLED TO, READ BACK BY AND VERIFIED WITH: J LEGGE,PHARMD AT 0835 02/13/17 BY L BENFIELD    Culture   Final    GRAM POSITIVE RODS BACTEROIDES FRAGILIS BETA LACTAMASE POSITIVE GRAM POSITIVE RODS NOT RECOVERED IN CULTURE Performed at [facility], [address], [place], [place] [address]    Report Status 02/15/2017 FINAL  Final  Blood Culture ID Panel (Reflexed)     Status: None   Collection Time: 02/07/17 10:27 PM  Result Value Ref Range Status   Enterococcus species NOT DETECTED NOT DETECTED Final   Listeria monocytogenes NOT DETECTED NOT DETECTED Final   Staphylococcus species NOT DETECTED NOT DETECTED Final   Staphylococcus aureus NOT DETECTED NOT DETECTED Final   Streptococcus species NOT DETECTED NOT DETECTED Final   Streptococcus agalactiae NOT DETECTED NOT DETECTED Final   Streptococcus pneumoniae NOT DETECTED NOT DETECTED Final   Streptococcus pyogenes NOT DETECTED NOT DETECTED Final   Acinetobacter baumannii NOT DETECTED NOT DETECTED Final   Enterobacteriaceae species NOT DETECTED NOT DETECTED  Final   Enterobacter cloacae complex NOT DETECTED NOT DETECTED Final   Escherichia coli NOT DETECTED  NOT DETECTED Final   Klebsiella oxytoca NOT DETECTED NOT DETECTED Final   Klebsiella pneumoniae NOT DETECTED NOT DETECTED Final   Proteus species NOT DETECTED NOT DETECTED Final   Serratia marcescens NOT DETECTED NOT DETECTED Final   Haemophilus influenzae NOT DETECTED NOT DETECTED Final   Neisseria meningitidis NOT DETECTED NOT DETECTED Final   Pseudomonas aeruginosa NOT DETECTED NOT DETECTED Final   Candida albicans NOT DETECTED NOT DETECTED Final   Candida glabrata NOT DETECTED NOT DETECTED Final   Candida krusei NOT DETECTED NOT DETECTED Final   Candida parapsilosis NOT DETECTED NOT DETECTED Final   Candida tropicalis NOT DETECTED NOT DETECTED Final    Comment: Performed at [facility], [address], [place], [place] [address]  MRSA PCR Screening     Status: None   Collection Time: 02/08/17  5:29 AM  Result Value Ref Range Status   MRSA by PCR NEGATIVE NEGATIVE Final    Comment:        The GeneXpert MRSA Assay (FDA approved for NASAL specimens only), is one component of a comprehensive MRSA colonization surveillance program. It is not intended to diagnose MRSA infection nor to guide or monitor treatment for MRSA infections.     Radiology Studies: No results found.  Scheduled Meds: . amiodarone  200 mg Oral Daily  . calcium-vitamin D  1 tablet Oral Once per day on Mon Wed Fri  . docusate sodium  100 mg Oral BID  . famotidine  20 mg Oral BID  . feeding supplement (ENSURE ENLIVE)  237 mL Oral TID BM  . furosemide  20 mg Intravenous Daily  . gabapentin  800 mg Oral TID  . insulin aspart  0-5 Units Subcutaneous QHS  . insulin aspart  0-9 Units Subcutaneous TID WC  . magnesium oxide  400 mg Oral Daily  . midodrine  10 mg Oral TID WC  . multivitamin  1 tablet Oral BID  . nutrition supplement (JUVEN)  1 packet Oral BID BM  . polyethylene glycol  17 g Oral BID  . potassium chloride SA  20 mEq Oral BID  . protein supplement shake  11 oz Oral Q24H  . rivaroxaban   20 mg Oral Q supper  . sodium chloride flush  3 mL Intravenous Q12H  . sodium chloride flush  3 mL Intravenous Q12H  . tamsulosin  0.4 mg Oral Daily  . tiotropium  18 mcg Inhalation Daily   Continuous Infusions: . sodium chloride 250 mL (02/09/17 1900)  . piperacillin-tazobactam (ZOSYN)  IV Stopped (02/16/17 1736)  . vancomycin 750 mg (02/16/17 1700)     LOS: 9 days   Time spent: 25 minutes   Debbora Presto, MD [facility] Pager [phone]  If 7PM-7AM, please contact night-coverage [email or web address] Password [email or web address] 02/16/2017, 5:13 PM

## 2017-02-17 LAB — GLUCOSE, CAPILLARY
GLUCOSE-CAPILLARY: 98 mg/dL (ref 65–99)
GLUCOSE-CAPILLARY: 105 mg/dL — AB (ref 65–99)
GLUCOSE-CAPILLARY: 133 mg/dL — AB (ref 65–99)
GLUCOSE-CAPILLARY: 146 mg/dL — AB (ref 65–99)

## 2017-02-17 LAB — BASIC METABOLIC PANEL
ANION GAP: 7 (ref 5–15)
BUN: 21 mg/dL — ABNORMAL HIGH (ref 6–20)
CHLORIDE: 92 mmol/L — AB (ref 101–111)
CO2: 39 mmol/L — AB (ref 22–32)
CREATININE: 0.7 mg/dL (ref 0.61–1.24)
Calcium: 8.7 mg/dL — ABNORMAL LOW (ref 8.9–10.3)
GFR calc non Af Amer: >60 mL/min (ref >60)
Glucose, Bld: 99 mg/dL (ref 65–99)
POTASSIUM: 4.6 mmol/L (ref 3.5–5.1)
Sodium: 138 mmol/L (ref 135–145)

## 2017-02-17 LAB — CBC
HEMATOCRIT: 25.6 % — AB (ref 39.0–52.0)
HEMOGLOBIN: 8.3 g/dL — AB (ref 13.0–17.0)
MCH: 31.7 pg (ref 26.0–34.0)
MCHC: 32.4 g/dL (ref 30.0–36.0)
MCV: 97.7 fL (ref 78.0–100.0)
Platelets: 362 10*3/uL (ref 150–400)
RBC: 2.62 MIL/uL — AB (ref 4.22–5.81)
RDW: 14.7 % (ref 11.5–15.5)
WBC: 10 10*3/uL (ref 4.0–10.5)

## 2017-02-17 LAB — VANCOMYCIN, TROUGH: VANCOMYCIN TR: 18 ug/mL (ref 15–20)

## 2017-02-17 MED ORDER — SODIUM CHLORIDE 0.9 % IV SOLN
1500.0000 mg | INTRAVENOUS | Status: DC
  Administered 2017-02-17: 1500 mg via INTRAVENOUS
  Filled 2017-02-17 (×2): qty 1500

## 2017-02-17 NOTE — Progress Notes (Signed)
   02/17/17 1300 Hydrotherapy treatment note  Subjective Assessment  Subjective It would be fine if you forgot about me.  Prior Treatments repositioning  Evaluation and Treatment  Evaluation and Treatment Procedures Explained to Patient/Family Yes  Evaluation and Treatment Procedures agreed to  Wound / Incision (Open or Dehisced) 02/09/17 Other (Comment) Sacrum Medial lareg openn  nstage 4 wound, post surgical debridement.  Date First Assessed/Time First Assessed: 02/09/17 1448   Wound Type: Other (Comment)  Location: Sacrum  Location Orientation: Medial  Wound Description (Comments): lareg openn  nstage 4 wound, post surgical debridement.  Dressing Type Gauze (Comment);Moist to dry;ABD  Dressing Status Intact;Clean;Dry  Dressing Change Frequency Twice a day  Site / Wound Assessment Dressing in place / Unable to assess;Granulation tissue;Painful  % Wound base Red or Granulating 70%  % Wound base Yellow/Fibrinous Exudate 10%  % Wound base Black/Eschar 20% (bone)  Peri-wound Assessment Excoriated;Erythema (blanchable)  Wound Length (cm) 7 cm  Wound Width (cm) 7 cm  Wound Depth (cm) 4 cm  Wound Volume (cm^3) 196 cm^3  Wound Surface Area (cm^2) 49 cm^2  Tunneling (cm) 4 (at 9-10:00, 5 cm  along left gluteus)  Margins Unattached edges (unapproximated)  Drainage Amount Moderate  Drainage Description Serosanguineous  Non-staged Wound Description Full thickness  Treatment Debridement (Selective);Hydrotherapy (Pulse lavage);Packing (Plain strip);Packing (Saline gauze)  Hydrotherapy  Pulsed lavage therapy - wound location sacrum  Pulsed Lavage with Suction (psi) 8 psi  Pulsed Lavage with Suction - Normal Saline Used 1000 mL  Pulsed Lavage Tip Tip with splash shield (and tunnel along left gluteus)  Wound Therapy - Assess/Plan/Recommendations  Wound Therapy - Clinical Statement The  wound has increased  granulation tissue, continues with bone and small bone fragment in wound base.  surrounding skin is much improved since Foley was placed. Continue PLS  Factors Delaying/Impairing Wound Healing Immobility;Multiple medical problems  Hydrotherapy Plan Debridement;Dressing change;Patient/family education;Pulsatile lavage with suction  Wound Therapy - Frequency 6X / week  Wound Therapy - Current Recommendations PT  Wound Therapy - Follow Up Recommendations Skilled nursing facility  Wound Plan PLS  and dressing chnages, debridement  Wound Therapy Goals - Improve the function of patient's integumentary system by progressing the wound(s) through the phases of wound healing by:  Decrease Necrotic Tissue to 10  Decrease Necrotic Tissue - Progress Goal set today  Increase Granulation Tissue to 90  Increase Granulation Tissue - Progress Goal set today  Improve Drainage Characteristics Min  Improve Drainage Characteristics - Progress Progressing toward goal  Patient/Family will be able to  reposition off of the sacrum  Patient/Family Instruction Goal - Progress Progressing toward goal  Goals/treatment plan/discharge plan were made with and agreed upon by patient/family Yes  Time For Goal Achievement 2 weeks  Wound Therapy - Potential for Goals [name] PT [phone]

## 2017-02-17 NOTE — Progress Notes (Signed)
Patient ID: [name], male   DOB: 10-13-1926, 83 y.o.   MRN: [id number]    PROGRESS NOTE  Livingston Denner Orsino  WUJ:[id number] DOB: Sep 04, 1926 DOA: 02/07/2017  PCP: [name], MD   Brief Narrative:  Pt is 83 yo male with known paroxysmal a-fib on Xarelto, chronic diastolic CHF, hypotension and recently started on Midodrine, s/p TKR 2 weeks prior to this admission, presented to [facility] ED from SNF with fevers, hypotension, severe pain from sacral ulcer.   Assessment & Plan: Sepsis/septic shock secondary to infected sacral pressure ulcer, ? Osteomyelitis  - stage IV sacral decubitus ulcer with purulent and necrotic debris - s/p debridement of sacral ulcer, final wound 10 x 10 x 4 cm deep with 5 cm tunneling bilaterally into the gluteus, exposed bone/periosteum in the base of the wound, 9/25 (Dr. Fredricka Bonine) - post op day #9 - Tmax 98.47F in the past 24 hours, WBC now trending down and is WNL  - continue Zosyn day #10, vanc day #8 - blood cultures left arm positive for Clostridium and right arm gram + rods and gram - coccobacilli - I have discussed this was ID specialist Dr. Luciana Axe, he recommended prolonged IV treatment with IV ABX for 6 weeks as this could be treated as osteomyelitis, Zosyn can be narrowed down to Rocephin and flagyl prior to discharge  - appreciate surgery team following - continue with hydrotherapy and BID wet to dry dressing changes - repeat CBC in AM  Paroxysmal atrial fibrillation, CHADS-VASc at least 3 - continue Amiodarone and Xarelto  - rate controlled   Chronic diastolic CHF  - has received hydration initially but IVF have been stopped - weight trend in the past 72 hours: Filed Weights   02/15/17 0500 02/16/17 0500 02/17/17 0500  Weight: 98.5 kg (217 lb 2.5 oz) 97.9 kg (215 lb 13.3 oz) 98 kg (216 lb 0.8 oz)  - mild pleural effusion on CXR but no pulmonary edema  - cont with lasix IV QD  - I have educated pt on use of IS, he has done well and encourage to use  Q1 hour while awake at least 4-5 time in an hour and he has verbalized understanding   Hypokalemia - supplemented and WNL  - BMP in AM  Anemia , normocytic - Hg overall stable, no evidence of active bleeding  - CBC in AM  Recent left knee surgery  - cont physical therapy  - will ask D.r Charlann Boxer to reeval prior to discharge   Hypotension - improving  - continue Midodrine   Obesity  - Body mass index is 35.37 kg/m.  DVT prophylaxis: Xarelto  Code Status: Full  Family Communication: pt and daughter at bedside  Disposition Plan: will needs SNF suspect 1-2 days  Consultants:   Surgery   Procedures:   OR for I&D 9/25   Antimicrobials:   Zosyn 9/25 -->  Vancomycin 9/25 --> 9/25, restarted on 9/27 -->  Subjective: Pt still tired this am but overall better.   Objective: Vitals:   02/16/17 2200 02/17/17 0442 02/17/17 0500 02/17/17 1344  BP: (!) 105/40 (!) 100/42  (!) 123/48  Pulse: 70 70  95  Resp: Temp: 99.1 F (37.3 C) 99.1 F (37.3 C)  99.1 F (37.3 C)  TempSrc: Oral Axillary  Oral  SpO2: 94% 96%  97%  Weight:   98 kg (216 lb 0.8 oz)   Height:        Intake/Output Summary (Last  24 hours) at 02/17/17 1833 Last data filed at 02/17/17 1345  Gross per 24 hour  Intake          1100.66 ml  Output              400 ml  Net           700.66 ml   Filed Weights   02/15/17 0500 02/16/17 0500 02/17/17 0500  Weight: 98.5 kg (217 lb 2.5 oz) 97.9 kg (215 lb 13.3 oz) 98 kg (216 lb 0.8 oz)   Physical Exam  Constitutional: Appears NAD CVS: RRR, S1/S2 +, no murmurs, no gallops, no carotid bruit.  Pulmonary: Effort and breath sounds normal, no stridor, rhonchi, wheezes, rales.  Abdominal: Soft. BS +,  no distension, tenderness, rebound or guarding.   Data Reviewed: I have personally reviewed following labs and imaging studies  CBC:  Recent Labs Lab 02/13/17 0622 02/14/17 0530 02/15/17 0512 02/16/17 0512 02/17/17 0510  WBC 13.3* 10.1 11.3* 11.6* 10.0   HGB 8.2* 8.4* 8.6* 8.6* 8.3*  HCT 25.1* 26.3* 26.6* 26.2* 25.6*  MCV 96.5 95.6 97.1 96.7 97.7  PLT 508* 467* 435* 416* 362   Basic Metabolic Panel:  Recent Labs Lab 02/13/17 0622 02/14/17 0530 02/15/17 0512 02/16/17 0512 02/17/17 0510  NA 139 138 136 135 138  K 4.4 4.4 4.6 4.8 4.6  CL 96* 93* 90* 91* 92*  CO2 37* 40* 40* 35* 39*  GLUCOSE 98 107* 96 100* 99  BUN 12 13 13 19  21*  CREATININE 0.57* 0.61 0.63 0.72 0.70  CALCIUM 8.4* 8.7* 8.7* 8.7* 8.7*   CBG:  Recent Labs Lab 02/16/17 1652 02/16/17 2223 02/17/17 0742 02/17/17 1155 02/17/17 1629  GLUCAP 106* 145* 98 105* 133*   Radiology Studies: No results found.  Scheduled Meds: . amiodarone  200 mg Oral Daily  . calcium-vitamin D  1 tablet Oral Once per day on Mon Wed Fri  . docusate sodium  100 mg Oral BID  . famotidine  20 mg Oral BID  . feeding supplement (ENSURE ENLIVE)  237 mL Oral TID BM  . furosemide  20 mg Intravenous Daily  . gabapentin  800 mg Oral TID  . insulin aspart  0-5 Units Subcutaneous QHS  . insulin aspart  0-9 Units Subcutaneous TID WC  . magnesium oxide  400 mg Oral Daily  . midodrine  10 mg Oral TID WC  . multivitamin  1 tablet Oral BID  . nutrition supplement (JUVEN)  1 packet Oral BID BM  . polyethylene glycol  17 g Oral BID  . potassium chloride SA  20 mEq Oral BID  . protein supplement shake  11 oz Oral Q24H  . rivaroxaban  20 mg Oral Q supper  . sodium chloride flush  3 mL Intravenous Q12H  . sodium chloride flush  3 mL Intravenous Q12H  . tamsulosin  0.4 mg Oral Daily  . tiotropium  18 mcg Inhalation Daily   Continuous Infusions: . sodium chloride 250 mL (02/09/17 1900)  . piperacillin-tazobactam (ZOSYN)  IV Stopped (02/17/17 1752)  . vancomycin       LOS: 10 days   Time spent: 25 minutes   Debbora Presto, MD [facility] Pager [phone]  If 7PM-7AM, please contact night-coverage [email or web address] Password [facility] 02/17/2017, 6:33 PM

## 2017-02-17 NOTE — Progress Notes (Signed)
CSW contacted by patient's daughter and informed that they prefer [facility] SNF. [facility] did not offer patient a bed. CSW followed up with [facility] to see if there is any chance patient could be offered a bed, CSW awaiting response. Patient's daughter requested that CSW check with SNFs in [facility] to see if they offer hydrotherapy. CSW will contact SNFs in [facility] to see if they offer hydrotherapy. CSW will continue to follow and assist with dc planning.  Celso Sickle, [place] Clinical Social Worker [facility] Cell#: [phone]

## 2017-02-17 NOTE — Care Management Note (Signed)
Case Management Note  Patient Details  Name: KYRILLOS KALAFUT MRN: [id number] Date of Birth: [birth year]  Subjective/Objective:  Again informed that patient is NOT appropriate for LTACH-updated MD/dtr-[place]/CSW. D/c plan SNF.                 Action/Plan:d/c SNF.   Expected Discharge Date:                  Expected Discharge Plan:  Skilled Nursing Facility  In-House Referral:  Clinical Social Work  Discharge planning Services  CM Consult  Post Acute Care Choice:    Choice offered to:     DME Arranged:    DME Agency:     HH Arranged:    HH Agency:     Status of Service:  In process, will continue to follow  If discussed at Long Length of Stay Meetings, dates discussed:    Additional Comments:  Lanier Clam, RN 02/17/2017, 11:36 AM

## 2017-02-17 NOTE — Progress Notes (Signed)
Pharmacy Antibiotic Note  [name] is a 83 y.o. male admitted on 02/07/2017 with cellulitis, sacral ulcer with purulent drainage and necrotic eschar.  Pharmacy was initially consulted for Vancomycin and Zosyn dosing.  Antibiotics were narrowed to Zosyn alone on 9/25, however, now pharmacy is consulted to resume Vancomycin dosing for broader coverage due to increasing WBC and possible osteo.  S/p debridement on 9/25.  Today, 02/17/2017: Day #10 zosyn, Day #7 vancomycin  Vancomycin trough = 46mcg/mL on 10/4  SCr stable   WBC improved to WNL  Afebrile  9/24 Bcx 1/2: clostridium ramosum, 1/2 updated to b. fragilis  Plan:  Continue Zosyn 3.375g IV Q8H infused over 4hrs.  Vancomycin  Trough acceptable but has trending up over the last 4 days. Due to age and concern for toxicity, change to vancomycin 1500mg  IV 24h for est new trough = 13.5 mcg/mL (this will be slightly below goal of 15-20 mcg/mL for possible osteomyelitis but likely safer dose)  Daily SCr d/t nephrotoxicity of current regimen  Follow up renal fxn, culture results, and clinical course.  TRH has d/w ID antibiotic plan - ceftriaxone + metronidazole at d/c to compelte 6 weeks, TRH to confirm whether vancomycin to be part of this regimen  Height: 5\' 6"  (167.6 cm) Weight: 216 lb 0.8 oz (98 kg) IBW/kg (Calculated) : 63.8  Temp (24hrs), Avg:98.8 F (37.1 C), Min:98.3 F (36.8 C), Max:99.1 F (37.3 C)   Recent Labs Lab 02/13/17 0622 02/14/17 0530 02/15/17 0512 02/16/17 0512 02/17/17 0510  WBC 13.3* 10.1 11.3* 11.6* 10.0  CREATININE 0.57* 0.61 0.63 0.72 0.70  VANCOTROUGH 15  --   --   --  18    Estimated Creatinine Clearance: 71.3 mL/min (by C-G formula based on SCr of 0.7 mg/dL).    Allergies  Allergen Reactions  . Nsaids     Stomach pain  . Vicodin [Hydrocodone-Acetaminophen] Other (See Comments)    Unknown reaction many years ago  . Morphine And Related Nausea And Vomiting    Dizziness, light headed.  "Opiates cause tightness in chest".    Antimicrobials this admission:  9/24 Vancomycin >> 9/25, 9/28 >> 9/24 Zosyn >>   Levels:  9/30 0622 VT = 15 mcg/mL on 750g q12h (prior to 5th dose) 10/4 0530 VT = 18  on 750mg  IV q12h   Microbiology results:  9/24 BCx x2: 1/2 Clostridium ramosum,                        1/2 Bacteroides fragilis beta lactamase positive 9/24 UCx: insignificant growth 9/24 back wound: multiple organisms, none predominant 9/25 MRSA PCR: neg  Thank you for allowing pharmacy to be a part of this patient's care.  Juliette Alcide, PharmD, BCPS.   Pager: [phone] 02/17/2017 8:47 AM

## 2017-02-18 ENCOUNTER — Inpatient Hospital Stay (HOSPITAL_COMMUNITY): Payer: Medicare Other

## 2017-02-18 DIAGNOSIS — J9601 Acute respiratory failure with hypoxia: Secondary | ICD-10-CM

## 2017-02-18 DIAGNOSIS — I5033 Acute on chronic diastolic (congestive) heart failure: Secondary | ICD-10-CM

## 2017-02-18 DIAGNOSIS — I481 Persistent atrial fibrillation: Secondary | ICD-10-CM

## 2017-02-18 LAB — BASIC METABOLIC PANEL
Anion gap: 7 (ref 5–15)
BUN: 28 mg/dL — ABNORMAL HIGH (ref 6–20)
CHLORIDE: 95 mmol/L — AB (ref 101–111)
CO2: 38 mmol/L — AB (ref 22–32)
CREATININE: 0.68 mg/dL (ref 0.61–1.24)
Calcium: 8.3 mg/dL — ABNORMAL LOW (ref 8.9–10.3)
GFR calc Af Amer: >60 mL/min (ref >60)
Glucose, Bld: 114 mg/dL — ABNORMAL HIGH (ref 65–99)
Potassium: 4.3 mmol/L (ref 3.5–5.1)
Sodium: 140 mmol/L (ref 135–145)

## 2017-02-18 LAB — CBC
HEMATOCRIT: 25.8 % — AB (ref 39.0–52.0)
HEMOGLOBIN: 8.1 g/dL — AB (ref 13.0–17.0)
MCH: 31.2 pg (ref 26.0–34.0)
MCHC: 31.4 g/dL (ref 30.0–36.0)
MCV: 99.2 fL (ref 78.0–100.0)
Platelets: 338 10*3/uL (ref 150–400)
RBC: 2.6 MIL/uL — ABNORMAL LOW (ref 4.22–5.81)
RDW: 14.6 % (ref 11.5–15.5)
WBC: 10.1 10*3/uL (ref 4.0–10.5)

## 2017-02-18 LAB — GLUCOSE, CAPILLARY
GLUCOSE-CAPILLARY: 94 mg/dL (ref 65–99)
GLUCOSE-CAPILLARY: 127 mg/dL — AB (ref 65–99)
GLUCOSE-CAPILLARY: 133 mg/dL — AB (ref 65–99)
Glucose-Capillary: 116 mg/dL — ABNORMAL HIGH (ref 65–99)

## 2017-02-18 MED ORDER — BUMETANIDE 1 MG PO TABS
1.0000 mg | ORAL_TABLET | Freq: Two times a day (BID) | ORAL | Status: DC
  Administered 2017-02-18 – 2017-02-21 (×6): 1 mg via ORAL
  Filled 2017-02-18 (×7): qty 1

## 2017-02-18 MED ORDER — DEXTROSE 5 % IV SOLN
2.0000 g | INTRAVENOUS | Status: DC
  Administered 2017-02-18 – 2017-02-20 (×3): 2 g via INTRAVENOUS
  Filled 2017-02-18 (×4): qty 2

## 2017-02-18 MED ORDER — METRONIDAZOLE IN NACL 5-0.79 MG/ML-% IV SOLN
500.0000 mg | Freq: Three times a day (TID) | INTRAVENOUS | Status: DC
  Administered 2017-02-18 – 2017-02-21 (×9): 500 mg via INTRAVENOUS
  Filled 2017-02-18 (×11): qty 100

## 2017-02-18 NOTE — Progress Notes (Signed)
10 Days Post-Op    CC:  decubitus  Subjective: Asking if there is any way we can make this less painful.  He says it really hurts to do dressing change.  Objective: Vital signs in last 24 hours: Temp:  [98.6 F (37 C)-99.5 F (37.5 C)] 99.5 F (37.5 C) (10/05 0347) Pulse Rate:  [71-73] 71 (10/05 0347) Resp:  [16-17] 16 (10/05 0347) BP: (114-121)/(45-54) 121/54 (10/05 0347) SpO2:  [93 %-97 %] 94 % (10/05 0921) Weight:  [97.8 kg (215 lb 9.8 oz)] 97.8 kg (215 lb 9.8 oz) (10/05 0500) Last BM Date: 02/17/17  Intake/Output from previous day: 10/04 0701 - 10/05 0700 In: 1278.5 [P.O.:300; I.V.:278.5; IV Piggyback:700] Out: 1650 [Urine:1650] Intake/Output this shift: No intake/output data recorded.  General appearance: alert, cooperative and no distress Skin: Open site is about the same, the white fibrous tissue at the base is cleaning up.  He is down to boen. The rest of the wound looks good.  Lab Results:   Recent Labs  02/17/17 0510 02/18/17 0421  WBC 10.0 10.1  HGB 8.3* 8.1*  HCT 25.6* 25.8*  PLT 362 338    BMET  Recent Labs  02/17/17 0510 02/18/17 0421  NA 138 140  K 4.6 4.3  CL 92* 95*  CO2 39* 38*  GLUCOSE 99 114*  BUN 21* 28*  CREATININE 0.70 0.68  CALCIUM 8.7* 8.3*   PT/INR No results for input(s): LABPROT, INR in the last 72 hours.  No results for input(s): AST, ALT, ALKPHOS, BILITOT, PROT, ALBUMIN in the last 168 hours.   Lipase     Component Value Date/Time   LIPASE 23.0 12/30/2011 1000     Medications: . amiodarone  200 mg Oral Daily  . bumetanide  1 mg Oral BID  . calcium-vitamin D  1 tablet Oral Once per day on Mon Wed Fri  . docusate sodium  100 mg Oral BID  . famotidine  20 mg Oral BID  . feeding supplement (ENSURE ENLIVE)  237 mL Oral TID BM  . furosemide  20 mg Intravenous Daily  . gabapentin  800 mg Oral TID  . insulin aspart  0-5 Units Subcutaneous QHS  . insulin aspart  0-9 Units Subcutaneous TID WC  . magnesium oxide  400  mg Oral Daily  . midodrine  10 mg Oral TID WC  . multivitamin  1 tablet Oral BID  . nutrition supplement (JUVEN)  1 packet Oral BID BM  . polyethylene glycol  17 g Oral BID  . potassium chloride SA  20 mEq Oral BID  . protein supplement shake  11 oz Oral Q24H  . rivaroxaban  20 mg Oral Q supper  . sodium chloride flush  3 mL Intravenous Q12H  . sodium chloride flush  3 mL Intravenous Q12H  . tamsulosin  0.4 mg Oral Daily  . tiotropium  18 mcg Inhalation Daily    Assessment/Plan  PAF - on xarelto Chronic diastolic CHF (ECHO 08/2015 EF 50-55%) Hypotension on midrin S/p left TKA 01/24/17 Code status DNR  Stage IV sacral decubitus ulcer with purulent and necrotic debris S/p debridement of sacral decubitus ulcer, stage IV, final wound 10x10x4cm deep with 5cm tunneling bilaterally into the gluteus, exposed bone/periosteum in base of wound 9/25 Dr. Fredricka Bonine - POD 8 - undergoing hydrotherapy - WBC stabel at 11.6, TMAX 99.3 - having loose stools  ID - vancomycin restarted 9/28 =>>day 4,zosyn 9/25>>day#4, vancomycin 9/24 =>>day 7 FEN - heart healthy diet VTE - SCDs, xarelto  Foley - none Follow up - wound clinic     Plan:  No further surgery.  Continue wound care and let him follow up in the Wound clinc.  Please call if we can be of further assistance.   LOS: 11 days    Kemaria Dedic 02/18/2017 [phone]

## 2017-02-18 NOTE — Progress Notes (Signed)
   02/18/17 1300  Subjective Assessment  Subjective can they put you under for this?  Patient and Family Stated Goals to get some pain relief  Prior Treatments repositioning  Evaluation and Treatment  Evaluation and Treatment Procedures Explained to Patient/Family Yes  Evaluation and Treatment Procedures agreed to  Wound / Incision (Open or Dehisced) 02/09/17 Other (Comment) Sacrum Medial lareg openn  nstage 4 wound, post surgical debridement.  Date First Assessed/Time First Assessed: 02/09/17 1448   Wound Type: Other (Comment)  Location: Sacrum  Location Orientation: Medial  Wound Description (Comments): lareg openn  nstage 4 wound, post surgical debridement.  Dressing Type ABD;Gauze ;Barrier Film (skin prep) (Saline dampened Kerlix; Packing Strips)  Dressing Changed Changed  Dressing Change Frequency Twice a day  Site / Wound Assessment Pink;Red;Yellow (bone)  % Wound base Red or Granulating 70%  % Wound base Yellow/Fibrinous Exudate 10%  % Wound base Other/Granulation Tissue (Comment) 20% (bone/connective tissue)  Peri-wound Assessment Excoriated;Erythema (blanchable) (reddened areas likely from tape )  Margins Unattached edges (unapproximated)  Drainage Amount Moderate  Drainage Description Serosanguineous  Non-staged Wound Description Full thickness  Treatment Hydrotherapy (Pulse lavage);Packing (Saline gauze);Debridement (Selective)  Hydrotherapy  Pulsed lavage therapy - wound location sacrum  Pulsed Lavage with Suction (psi) 8 psi  Pulsed Lavage with Suction - Normal Saline Used 1000 mL  Pulsed Lavage Tip Tip with splash shield (tunneling tip as well)  Wound Therapy - Assess/Plan/Recommendations  Wound Therapy - Clinical Statement The  wound has increased  granulation tissue, continues with bone and small bone fragment in wound base. surrounding skin is much imptroved since Foley was palsced. Continue PLS. May be able to consider decreasing frequency  Wound Therapy -  Functional Problem List significantly impaired mobility  pre and post LTKA.  Factors Delaying/Impairing Wound Healing Immobility;Multiple medical problems;Incontinence  Hydrotherapy Plan Debridement;Dressing change;Patient/family education;Pulsatile lavage with suction  Wound Therapy - Frequency 6X / week  Wound Therapy - Current Recommendations PT  Wound Therapy - Follow Up Recommendations Skilled nursing facility  Wound Plan PLS  and dressing chnages, debridement  Wound Therapy Goals - Improve the function of patient's integumentary system by progressing the wound(s) through the phases of wound healing by:  Decrease Necrotic Tissue to 10  Decrease Necrotic Tissue - Progress Progressing toward goal  Increase Granulation Tissue to 90  Increase Granulation Tissue - Progress Progressing toward goal  Improve Drainage Characteristics Min  Improve Drainage Characteristics - Progress Progressing toward goal  Goals/treatment plan/discharge plan were made with and agreed upon by patient/family Yes  Time For Goal Achievement 2 weeks  Wound Therapy - Potential for Goals Fair    Rebeca Alert, MPT [phone]

## 2017-02-18 NOTE — Progress Notes (Signed)
CSW followed up with patient's insurance company and attempted to contact staff member [employer] ext. [phone], no answer. CSW called main line and spoke with staff member Corinne Ports who reported that patient's insurance authorization was under review and had not been approved or denied. Staff reported that CSW may receive a phone call in the next hours with authorization but she was not aware of patient's authorization status, CSW updated patient's daughter.  CSW awaiting call from insurance company about patient's insurance authorization.   Celso Sickle, [place] Clinical Social Worker [facility] Cell#: [phone]

## 2017-02-18 NOTE — Progress Notes (Signed)
CSW started insurance authorization for patient, staff member Angelique called and inquired about selected SNF. CSW informed staff that patient's daughter has not made final decision and will call back with selected SNF.   CSW spoke with [facility] SNF who reported they are able to offer patient a bed.  CSW updated patient's daughter, patient's daughter selected [facility] SNF.  CSW updated patient's insurance, awaiting return call.  Celso Sickle, [place] Clinical Social Worker [facility] Cell#: [phone]

## 2017-02-18 NOTE — Progress Notes (Signed)
Patient ID: [name], male   DOB: October 24, 1926, 83 y.o.   MRN: [id number]    PROGRESS NOTE  [name]  HXT:[id number] DOB: [birth year] DOA: 02/07/2017  PCP: [name] Savers, MD   Brief Narrative:  Pt is 83 yo male with known paroxysmal a-fib on Xarelto, chronic diastolic CHF, hypotension and recently started on Midodrine, s/p TKR 2 weeks prior to this admission, presented to [facility] ED from SNF with fevers, hypotension, severe pain from sacral ulcer.   Assessment & Plan: Septic shock secondary to osteomyelitis, clostridium and B. fragilis bacteremia due to infected stage IV sacral pressure ulcer: s/p debridement of sacral ulcer, final wound 10 x 10 x 4 cm deep with 5 cm tunneling bilaterally into the gluteus, exposed bone/periosteum in the base of the wound, 9/25 (Dr. Fredricka Bonine). 9/24 Blood cultures left arm positive for Clostridium and right arm B. fragilis +beta lactamase. Afebrile for the past week, WBC normalized.  - Case discussed by Dr. Izola Price with Dr. Luciana Axe whe recommended 6 weeks IV antibiotics narrowed down to ceftriaxone/flagyl.  Will order PICC.  - Surgery recommends continued hydrotherapy, W->D dressing changes BID, wound clinic follow up, signed off 10/5.  Persistent atrial fibrillation: CHA2DS2-VASc of 3. Rate well controlled.  - Continue amiodarone and xarelto.   Acute hypoxic respiratory failure: Most likely due to acute on chronic diastolic CHF: Due to IVF resuscitation initially. Has been getting lasix 20mg  IV once daily since 9/29 with stable weight around 215lbs. Last outpatient cardiology was 205lbs and bumex was continued at 1mg  BID.  - Stop lasix, restart home bumex.   - Will repeat CXR and wean oxygen as able.   Hypokalemia: Resolved with replacement.  - Monitor BMP. - Continue po replacement with loop diuretic.  Anemia , normocytic: Stable.  - Monitor CBC if bleeding noted or in 1 week.  Osteoarthritis s/p right TKA 01/24/2017:  - Continue PT, will need  SNF - Follow up with Dr. Charlann Boxer in the next week.   Hypotension: Stable. Not due to sepsis.   - Continue midodrine.   Obesity  - Body mass index is 35.37 kg/m.  DVT prophylaxis: Xarelto  Code Status: Full  Family Communication: None at bedside Disposition Plan: Plan to discharge to SNF capable of hydrotherapy once PICC in place.   Consultants:   Surgery   Procedures:   OR for I&D 9/25   Antimicrobials:   Zosyn 9/25 - 10/5  Vancomycin 9/25 - 1-/5  Ceftriaxone 10/5 >>   Flagyl IV 10/5 >>   Subjective: Feels tired without fever, chills, dyspnea, chest pain, abdominal pain. Reports moderate pain around the wound. No knee pain.    Objective: BP (!) 121/54 (BP Location: Right Arm)   Pulse 71   Temp 99.5 F (37.5 C) (Oral)   Resp 16   Ht 5\' 6"  (1.676 m)   Wt 97.8 kg (215 lb 9.8 oz)   SpO2 94%   BMI 34.80 kg/m  Gen: Nontoxic-appearing 83 y.o.male in NAD HEENT: MMM, posterior oropharynx clear Pulm: Non-labored; CTAB, no wheezes  CV: Regular rate, no murmur appreciated; distal pulses intact/symmetric GI: + BS; soft, non-tender, non-distended Skin: ~7 x 7 x 4cm open wound with undermining on sacrum with granulation tissue, no purulence or bleeding. +Probe to bone.  Neuro: A&Ox3, CN II-XII without deficits  CBC:  Recent Labs Lab 02/14/17 0530 02/15/17 0512 02/16/17 0512 02/17/17 0510 02/18/17 0421  WBC 10.1 11.3* 11.6* 10.0 10.1  HGB 8.4* 8.6* 8.6* 8.3* 8.1*  HCT  26.3* 26.6* 26.2* 25.6* 25.8*  MCV 95.6 97.1 96.7 97.7 99.2  PLT 467* 435* 416* 362 338   Basic Metabolic Panel:  Recent Labs Lab 02/14/17 0530 02/15/17 0512 02/16/17 0512 02/17/17 0510 02/18/17 0421  NA 138 136 135 138 140  K 4.4 4.6 4.8 4.6 4.3  CL 93* 90* 91* 92* 95*  CO2 40* 40* 35* 39* 38*  GLUCOSE 107* 96 100* 99 114*  BUN 21* 28*  CREATININE 0.61 0.63 0.72 0.70 0.68  CALCIUM 8.7* 8.7* 8.7* 8.7* 8.3*   CBG:  Recent Labs Lab 02/17/17 1155 02/17/17 1629  02/17/17 2134 02/18/17 0726 02/18/17 1138  GLUCAP 105* 133* 146* 94 127*   Radiology Studies: No results found.  Scheduled Meds: . amiodarone  200 mg Oral Daily  . calcium-vitamin D  1 tablet Oral Once per day on Mon Wed Fri  . docusate sodium  100 mg Oral BID  . famotidine  20 mg Oral BID  . feeding supplement (ENSURE ENLIVE)  237 mL Oral TID BM  . furosemide  20 mg Intravenous Daily  . gabapentin  800 mg Oral TID  . insulin aspart  0-5 Units Subcutaneous QHS  . insulin aspart  0-9 Units Subcutaneous TID WC  . magnesium oxide  400 mg Oral Daily  . midodrine  10 mg Oral TID WC  . multivitamin  1 tablet Oral BID  . nutrition supplement (JUVEN)  1 packet Oral BID BM  . polyethylene glycol  17 g Oral BID  . potassium chloride SA  20 mEq Oral BID  . protein supplement shake  11 oz Oral Q24H  . rivaroxaban  20 mg Oral Q supper  . sodium chloride flush  3 mL Intravenous Q12H  . sodium chloride flush  3 mL Intravenous Q12H  . tamsulosin  0.4 mg Oral Daily  . tiotropium  18 mcg Inhalation Daily   Continuous Infusions: . sodium chloride 250 mL (02/09/17 1900)  . piperacillin-tazobactam (ZOSYN)  IV 3.375 g (02/18/17 1336)  . vancomycin Stopped (02/17/17 2315)     LOS: 11 days   Time spent: 25 minutes   Hazeline Junker, MD [facility] Pager [phone]   If 7PM-7AM, please contact night-coverage [email or web address] Password [email or web address] 02/18/2017, 1:54 PM

## 2017-02-18 NOTE — Progress Notes (Signed)
Pharmacy Antibiotic Note  [name] is a 83 y.o. male admitted on 02/07/2017 with cellulitis, sacral ulcer with purulent drainage and necrotic eschar.  Pharmacy was initially consulted for Vancomycin and Zosyn dosing.  Antibiotics were narrowed to Zosyn alone on 9/25, however, now pharmacy is consulted to resume Vancomycin dosing for broader coverage due to increasing WBC and possible osteo.  S/p debridement on 9/25. Narrow antibotics to ceftriaxone and metronidazole.    Today, 02/18/2017: Day #11 zosyn, Day #8 vancomycin  Vancomycin trough = 70mcg/mL on 10/4  SCr stable   WBC improved to WNL  Afebrile  9/24 Bcx 1/2: clostridium ramosum, 1/2 updated to b. fragilis  Plan:  Ceftriaxone 2gm IV q24h  No renal dose adjustment required, pharmacy will sign-off  Height: 5\' 6"  (167.6 cm) Weight: 215 lb 9.8 oz (97.8 kg) IBW/kg (Calculated) : 63.8  Temp (24hrs), Avg:99.1 F (37.3 C), Min:98.6 F (37 C), Max:99.5 F (37.5 C)   Recent Labs Lab 02/13/17 0622 02/14/17 0530 02/15/17 0512 02/16/17 0512 02/17/17 0510 02/18/17 0421  WBC 13.3* 10.1 11.3* 11.6* 10.0 10.1  CREATININE 0.57* 0.61 0.63 0.72 0.70 0.68  VANCOTROUGH 15  --   --   --  18  --     Estimated Creatinine Clearance: 71.2 mL/min (by C-G formula based on SCr of 0.68 mg/dL).    Allergies  Allergen Reactions  . Nsaids     Stomach pain  . Vicodin [Hydrocodone-Acetaminophen] Other (See Comments)    Unknown reaction many years ago  . Morphine And Related Nausea And Vomiting    Dizziness, light headed. "Opiates cause tightness in chest".    Antimicrobials this admission:  9/24 Vancomycin >> 9/25, 9/28 >> 9/24 Zosyn >>   Levels:  9/30 0622 VT = 15 mcg/mL on 750g q12h (prior to 5th dose) 10/4 0530 VT = 18  on 750mg  IV q12h   Microbiology results:  9/24 BCx x2: 1/2 Clostridium ramosum,                        1/2 Bacteroides fragilis beta lactamase positive 9/24 UCx: insignificant growth 9/24 back wound:  multiple organisms, none predominant 9/25 MRSA PCR: neg  Thank you for allowing pharmacy to be a part of this patient's care.  Juliette Alcide, PharmD, BCPS.   Pager: [phone] 02/18/2017 2:12 PM

## 2017-02-19 LAB — GLUCOSE, CAPILLARY
GLUCOSE-CAPILLARY: 133 mg/dL — AB (ref 65–99)
GLUCOSE-CAPILLARY: 111 mg/dL — AB (ref 65–99)
GLUCOSE-CAPILLARY: 120 mg/dL — AB (ref 65–99)
Glucose-Capillary: 125 mg/dL — ABNORMAL HIGH (ref 65–99)

## 2017-02-19 LAB — BASIC METABOLIC PANEL
ANION GAP: 7 (ref 5–15)
BUN: 24 mg/dL — ABNORMAL HIGH (ref 6–20)
CHLORIDE: 96 mmol/L — AB (ref 101–111)
CO2: 39 mmol/L — AB (ref 22–32)
CREATININE: 0.62 mg/dL (ref 0.61–1.24)
Calcium: 8.9 mg/dL (ref 8.9–10.3)
GFR calc non Af Amer: >60 mL/min (ref >60)
GLUCOSE: 113 mg/dL — AB (ref 65–99)
Potassium: 4.1 mmol/L (ref 3.5–5.1)
Sodium: 142 mmol/L (ref 135–145)

## 2017-02-19 MED ORDER — SODIUM CHLORIDE 0.9% FLUSH: 10.0000 mL | INTRAVENOUS | Status: DC | PRN

## 2017-02-19 NOTE — Progress Notes (Signed)
Peripherally Inserted Central Catheter/Midline Placement  The IV Nurse has discussed with the patient and/or persons authorized to consent for the patient, the purpose of this procedure and the potential benefits and risks involved with this procedure.  The benefits include less needle sticks, lab draws from the catheter, and the patient may be discharged home with the catheter. Risks include, but not limited to, infection, bleeding, blood clot (thrombus formation), and puncture of an artery; nerve damage and irregular heartbeat and possibility to perform a PICC exchange if needed/ordered by physician.  Alternatives to this procedure were also discussed.  Bard Power PICC patient education guide, fact sheet on infection prevention and patient information card has been provided to patient /or left at bedside.    PICC/Midline Placement Documentation  PICC Single Lumen 02/19/17 PICC Right Basilic 40 cm 0 cm (Active)  Indication for Insertion or Continuance of Line Home intravenous therapies (PICC only) 02/19/2017  2:00 PM  Exposed Catheter (cm) 0 cm 02/19/2017  2:00 PM  Site Assessment Clean;Dry;Intact 02/19/2017  2:00 PM  Line Status Flushed;Blood return noted 02/19/2017  2:00 PM  Dressing Type Transparent 02/19/2017  2:00 PM  Dressing Status Clean;Dry;Intact;Antimicrobial disc in place 02/19/2017  2:00 PM  Dressing Intervention New dressing 02/19/2017  2:00 PM  Dressing Change Due 02/26/17 02/19/2017  2:00 PM    Telephone consent   Stacie Glaze Horton 02/19/2017, 2:50 PM

## 2017-02-19 NOTE — Progress Notes (Signed)
   02/19/17 1300  Subjective Assessment  Subjective "why can't you give me something for the pain?" (RN pre-medicated with pills just prior to session)  Patient and Family Stated Goals to get some pain relief  Evaluation and Treatment  Evaluation and Treatment Procedures Explained to Patient/Family Yes  Evaluation and Treatment Procedures agreed to  Wound / Incision (Open or Dehisced) 02/09/17 Other (Comment) Sacrum Medial lareg openn  nstage 4 wound, post surgical debridement.  Date First Assessed/Time First Assessed: 02/09/17 1448   Wound Type: Other (Comment)  Location: Sacrum  Location Orientation: Medial  Wound Description (Comments): lareg openn  nstage 4 wound, post surgical debridement.  Dressing Type Gauze ;ABD pad;Barrier Film (skin prep) (Packing strips; Saline dampened Kerlix)  Dressing Changed Changed  Dressing Status Old drainage (soiled-BM)  Dressing Change Frequency Twice a day  Site / Wound Assessment Pink;Red;Brown;Yellow  % Wound base Red or Granulating 70%  % Wound base Yellow/Fibrinous Exudate 15%  % Wound base Other/Granulation Tissue (Comment) 15% (bone/connective tissue)  Peri-wound Assessment Erythema (blanchable)  Drainage Amount Moderate  Drainage Description Serosanguineous  Non-staged Wound Description Full thickness  Treatment Hydrotherapy (Pulse lavage);Packing (Plain strip);Packing (Saline gauze)  Hydrotherapy  Pulsed lavage therapy - wound location sacrum  Pulsed Lavage with Suction (psi) 8 psi  Pulsed Lavage with Suction - Normal Saline Used 1000 mL  Pulsed Lavage Tip Tip with splash shield (tunnel tip)  Wound Therapy - Assess/Plan/Recommendations  Wound Therapy - Clinical Statement The  wound has increased  granulation tissue, continues with bone and small bone fragment in wound base. surrounding skin is much imptroved since Foley was palsced. Continue PLS. May be able to consider decreasing frequency  Wound Therapy - Functional Problem List  significantly impaired mobility  pre and post LTKA.  Factors Delaying/Impairing Wound Healing Immobility;Multiple medical problems;Incontinence  Hydrotherapy Plan Debridement;Dressing change;Patient/family education;Pulsatile lavage with suction  Wound Therapy - Frequency 6X / week  Wound Therapy - Current Recommendations PT;Case manager/social work  Wound Therapy - Follow Up Recommendations Skilled nursing facility  Wound Plan PLS  and dressing chnages, debridement  Wound Therapy Goals - Improve the function of patient's integumentary system by progressing the wound(s) through the phases of wound healing by:  Decrease Necrotic Tissue to 10  Decrease Necrotic Tissue - Progress Progressing toward goal  Increase Granulation Tissue to 90  Increase Granulation Tissue - Progress Progressing toward goal  Improve Drainage Characteristics Min  Improve Drainage Characteristics - Progress Progressing toward goal  Patient/Family will be able to  reposition off of the sacrum  Patient/Family Instruction Goal - Progress Progressing toward goal  Goals/treatment plan/discharge plan were made with and agreed upon by patient/family Yes  Time For Goal Achievement 2 weeks  Wound Therapy - Potential for Goals Fair   [name] Alert, MPT [phone]

## 2017-02-19 NOTE — Progress Notes (Addendum)
Patient ID: [name], male   DOB: [birth year], 83 y.o.   MRN: [id number]    PROGRESS NOTE  [name]  WUG:[id number] DOB: [birth year] DOA: 02/07/2017  PCP: [name], MD   Brief Narrative:  Pt is 83 yo male with known paroxysmal a-fib on Xarelto, chronic diastolic CHF, hypotension and recently started on Midodrine, s/p TKR 2 weeks prior to this admission, presented to [facility] ED from SNF with fevers, hypotension, severe pain from sacral ulcer.   Assessment & Plan: Septic shock secondary to osteomyelitis, clostridium and B. fragilis bacteremia due to infected stage IV sacral pressure ulcer: s/p debridement of sacral ulcer, final wound 10 x 10 x 4 cm deep with 5 cm tunneling bilaterally into the gluteus, exposed bone/periosteum in the base of the wound, 9/25 (Dr. Fredricka Bonine). 9/24 Blood cultures left arm positive for Clostridium and right arm B. fragilis +beta lactamase. Afebrile for the past week, WBC normalized.  - Case discussed by Dr. Izola Price with Dr. Luciana Axe whe recommended 6 weeks IV antibiotics narrowed down to ceftriaxone/flagyl.  Awaiting PICC placement.  - Surgery recommends continued hydrotherapy, W->D dressing changes daily, wound clinic follow up, signed off 10/5. - Ok to use foley to allow for wound healing due to incontinence and chronic diuretic use.  Persistent atrial fibrillation: CHA2DS2-VASc of 3. Rate well controlled.  - Continue amiodarone and xarelto.   Acute hypoxic respiratory failure: Most likely due to acute on chronic diastolic CHF: Due to IVF resuscitation initially. Has been getting lasix 20mg  IV once daily since 9/29 with stable weight around 215lbs. Last outpatient cardiology was 205lbs and bumex was continued at 1mg  BID.  - Stop lasix, restart home bumex.   - Repeat CXR showed no significant pulmonary abnormality. Wean O2 as tolerated.    Hypokalemia: Resolved with replacement.  - Monitor BMP one more day, then weekly if stable. - Continue po replacement  with loop diuretic.  Anemia , normocytic: Stable.  - Monitor CBC if bleeding noted or in 1 week.  Osteoarthritis s/p right TKA 01/24/2017:  - Continue PT, will need SNF - Follow up with Dr. Charlann Boxer in the next week.    Hypotension: Stable. Not due to sepsis.   - Continue midodrine.   Obesity  - Body mass index is 35.37 kg/m.  DVT prophylaxis: Xarelto  Code Status: Full  Family Communication: None at bedside Disposition Plan: Plan to discharge to SNF capable of hydrotherapy once PICC in place. Pending insurance authorization.  Consultants:   Surgery   Procedures:   OR for I&D 9/25   Antimicrobials:   Zosyn 9/25 - 10/5  Vancomycin 9/25 - 1-/5  Ceftriaxone 10/5 >>   Flagyl IV 10/5 >>   Subjective: Feels tired again today, but sleeping well. Pain around wound is severe. Doesn't want to sit forward for pulm exam due to pain.  Objective: BP (!) 138/116 (BP Location: Right Arm) Comment: Had to take it 3 times  Pulse 80   Temp 98.9 F (37.2 C) (Oral)   Resp 12   Ht 5\' 6"  (1.676 m)   Wt 93.7 kg (206 lb 9.1 oz)   SpO2 90%   BMI 33.34 kg/m  Gen: Nontoxic-appearing 83 y.o. male in NAD resting in bed. HEENT: MMM, posterior oropharynx clear Pulm: Non-labored; CTAB, no wheezes  CV: Regular rate, no murmur appreciated; distal pulses intact/symmetric GI: + BS; soft, non-tender, non-distended Skin: ~7 x 7 x 4cm open wound with undermining on sacrum with granulation tissue, no purulence or  bleeding. +Probe to bone. Wound not examined today.  Neuro: A&Ox3, CN II-XII without deficits. Has periodic jerking limb movements at rest and with intention bilaterally.  CBC:  Recent Labs Lab 02/14/17 0530 02/15/17 0512 02/16/17 0512 02/17/17 0510 02/18/17 0421  WBC 10.1 11.3* 11.6* 10.0 10.1  HGB 8.4* 8.6* 8.6* 8.3* 8.1*  HCT 26.3* 26.6* 26.2* 25.6* 25.8*  MCV 95.6 97.1 96.7 97.7 99.2  PLT 467* 435* 416* 362 338   Basic Metabolic Panel:  Recent Labs Lab 02/15/17 0512  02/16/17 0512 02/17/17 0510 02/18/17 0421 02/19/17 0507  NA 136 135 138 140 142  K 4.6 4.8 4.6 4.3 4.1  CL 90* 91* 92* 95* 96*  CO2 40* 35* 39* 38* 39*  GLUCOSE 96 100* 99 114* 113*  BUN 13 19 21* 28* 24*  CREATININE 0.63 0.72 0.70 0.68 0.62  CALCIUM 8.7* 8.7* 8.7* 8.3* 8.9   CBG:  Recent Labs Lab 02/17/17 2134 02/18/17 0726 02/18/17 1138 02/18/17 1752 02/18/17 2106  GLUCAP 146* 94 127* 133* 116*   Radiology Studies: Dg Chest Port 1 View  Result Date: 02/18/2017 CLINICAL DATA:  Hypoxia.  Atrial fibrillation. EXAM: PORTABLE CHEST 1 VIEW COMPARISON:  February 11, 2017. FINDINGS: Lungs are mildly hyperexpanded. There is no edema or consolidation. Heart is mildly enlarged with pulmonary vascularity within normal limits. Prominence in the paratracheal regions is likely due to great vessel prominence in this age group. There is aortic atherosclerosis. Bones are osteoporotic. IMPRESSION: No edema or consolidation. Stable cardiomegaly. There is aortic atherosclerosis. Bones are osteoporotic. Aortic Atherosclerosis (ICD10-I70.0). Electronically Signed   By: Bretta Bang III M.D.   On: 02/18/2017 14:34    Scheduled Meds: . amiodarone  200 mg Oral Daily  . bumetanide  1 mg Oral BID  . calcium-vitamin D  1 tablet Oral Once per day on Mon Wed Fri  . docusate sodium  100 mg Oral BID  . famotidine  20 mg Oral BID  . feeding supplement (ENSURE ENLIVE)  237 mL Oral TID BM  . furosemide  20 mg Intravenous Daily  . gabapentin  800 mg Oral TID  . insulin aspart  0-5 Units Subcutaneous QHS  . insulin aspart  0-9 Units Subcutaneous TID WC  . magnesium oxide  400 mg Oral Daily  . midodrine  10 mg Oral TID WC  . multivitamin  1 tablet Oral BID  . nutrition supplement (JUVEN)  1 packet Oral BID BM  . polyethylene glycol  17 g Oral BID  . potassium chloride SA  20 mEq Oral BID  . protein supplement shake  11 oz Oral Q24H  . rivaroxaban  20 mg Oral Q supper  . sodium chloride flush  3 mL  Intravenous Q12H  . sodium chloride flush  3 mL Intravenous Q12H  . tamsulosin  0.4 mg Oral Daily  . tiotropium  18 mcg Inhalation Daily   Continuous Infusions: . sodium chloride 250 mL (02/09/17 1900)  . cefTRIAXone (ROCEPHIN)  IV Stopped (02/18/17 2203)  . metronidazole 500 mg (02/19/17 0500)     LOS: 12 days   Time spent: 25 minutes   Hazeline Junker, MD [facility] Pager [phone]   If 7PM-7AM, please contact night-coverage [email or web address] Password [email or web address] 02/19/2017, 11:46 AM

## 2017-02-20 LAB — BASIC METABOLIC PANEL
Anion gap: 6 (ref 5–15)
BUN: 23 mg/dL — AB (ref 6–20)
CALCIUM: 8.7 mg/dL — AB (ref 8.9–10.3)
CO2: 40 mmol/L — ABNORMAL HIGH (ref 22–32)
CREATININE: 0.69 mg/dL (ref 0.61–1.24)
Chloride: 95 mmol/L — ABNORMAL LOW (ref 101–111)
GFR calc non Af Amer: >60 mL/min (ref >60)
Glucose, Bld: 105 mg/dL — ABNORMAL HIGH (ref 65–99)
Potassium: 3.8 mmol/L (ref 3.5–5.1)
SODIUM: 141 mmol/L (ref 135–145)

## 2017-02-20 LAB — GLUCOSE, CAPILLARY
GLUCOSE-CAPILLARY: 107 mg/dL — AB (ref 65–99)
Glucose-Capillary: 150 mg/dL — ABNORMAL HIGH (ref 65–99)
Glucose-Capillary: 118 mg/dL — ABNORMAL HIGH (ref 65–99)
Glucose-Capillary: 148 mg/dL — ABNORMAL HIGH (ref 65–99)

## 2017-02-20 NOTE — Progress Notes (Signed)
Patient ID: [name], male   DOB: 10/28/1926, 83 y.o.   MRN: [id number]    PROGRESS NOTE  [name]  FAO:[id number] DOB: 05/24/1926 DOA: 02/07/2017  PCP: [name], MD   Brief Narrative:  Pt is 83 yo male with known paroxysmal a-fib on Xarelto, chronic diastolic CHF, hypotension and recently started on Midodrine, s/p TKR 2 weeks prior to this admission, presented to [facility] ED from SNF with fevers, hypotension, severe pain from sacral ulcer.   Assessment & Plan: Septic shock secondary to osteomyelitis, clostridium and B. fragilis bacteremia due to infected stage IV sacral pressure ulcer: s/p debridement of sacral ulcer, final wound 10 x 10 x 4 cm deep with 5 cm tunneling bilaterally into the gluteus, exposed bone/periosteum in the base of the wound, 9/25 (Dr. Fredricka Bonine). 9/24 Blood cultures left arm positive for Clostridium and right arm B. fragilis +beta lactamase. Afebrile, resolved leukocytosis.  - Case discussed by Dr. Izola Price with Dr. Luciana Axe whe recommended 6 weeks IV antibiotics narrowed down to ceftriaxone/flagyl through PICC placed 10/6. Antibiotics to be continued through 11/6.  - Surgery recommends continued hydrotherapy, W->D dressing changes daily, wound clinic follow up, signed off 10/5. - Tramadol prn wound dressing changes. This is providing good analgesia per pt. - Ok to use foley to allow for wound healing due to incontinence and chronic diuretic use.  Persistent atrial fibrillation: CHA2DS2-VASc of 3. Rate well controlled.  - Continue amiodarone and xarelto.   Acute hypoxic respiratory failure: Most likely due to acute on chronic diastolic CHF: Due to IVF resuscitation initially. Has been getting lasix  IV once daily since 9/29 with stable weight around 215lbs. Last outpatient cardiology was 205lbs and bumex was continued at  BID.  - Diuresing well with restart of home bumex. Weight down to 206lbs today. - Repeat CXR showed no significant pulmonary  abnormality. Wean O2 as tolerated.    Hypokalemia: Resolved with replacement.  - Monitor BMP in 1 week. - Continue po replacement with loop diuretic.  Anemia, normocytic: Stable.  - Monitor CBC if bleeding noted or in 1 week.  Osteoarthritis s/p right TKA 01/24/2017:  - Continue PT, will need SNF - Follow up with Dr. Charlann Boxer in the next week.    Hypotension: Stable. Not due to sepsis.   - Continue midodrine.   Obesity  - Body mass index is 35.37 kg/m.  DVT prophylaxis: Xarelto  Code Status: Full  Family Communication: None at bedside Disposition Plan: Plan to discharge to SNF capable of hydrotherapy pending insurance authorization (not operating today, so earliest is 10/8).   Consultants:   Surgery   Procedures:   OR for I&D 9/25   RUE PICC placement 10/6  Antimicrobials:   Zosyn 9/25 - 10/5  Vancomycin 9/25 - 1-/5  Ceftriaxone 10/5 >>   Flagyl IV 10/5 >>   Subjective: No complaints. Doesn't want any change to pain medications.   Objective: BP (!) 100/43 (BP Location: Left Arm)   Pulse 74   Temp 98.2 F (36.8 C) (Oral)   Resp 16   Ht  (1.676 m)   Wt 93.7 kg (206 lb 9.1 oz)   SpO2 (!) 86%   BMI 33.34 kg/m  Gen: Nontoxic-appearing 83 y.o. male in NAD resting in bed. HEENT: MMM, posterior oropharynx clear Pulm: Non-labored; CTAB, no wheezes  CV: Regular rate, no murmur appreciated; distal pulses intact/symmetric GI: + BS; soft, non-tender, non-distended Skin: ~7 x 7 x 4cm open wound with undermining on sacrum  with granulation tissue, no purulence or bleeding. +Probe to bone. Wound not examined today per pt preference. RUE PICC in place c/d/i without erythema.   Neuro: A&Ox3, CN II-XII without deficits. Has periodic jerking limb movements at rest and with intention bilaterally.  CBC:  Recent Labs Lab 02/14/17 0530 02/15/17 0512 02/16/17 0512 02/17/17 0510 02/18/17 0421  WBC 10.1 11.3* 11.6* 10.0 10.1  HGB 8.4* 8.6* 8.6* 8.3* 8.1*  HCT 26.3*  26.6* 26.2* 25.6* 25.8*  MCV 95.6 97.1 96.7 97.7 99.2  PLT 467* 435* 416* 362 338   Basic Metabolic Panel:  Recent Labs Lab 02/16/17 0512 02/17/17 0510 02/18/17 0421 02/19/17 0507 02/20/17 0359  NA 135 138 140 142 141  K 4.8 4.6 4.3 4.1 3.8  CL 91* 92* 95* 96* 95*  CO2 35* 39* 38* 39* 40*  GLUCOSE 100* 99 114* 113* 105*  BUN 19 21* 28* 24* 23*  CREATININE 0.72 0.70 0.68 0.62 0.69  CALCIUM 8.7* 8.7* 8.3* 8.9 8.7*   CBG:  Recent Labs Lab 02/19/17 1220 02/19/17 1638 02/19/17 2053 02/20/17 0737 02/20/17 1146  GLUCAP 133* 111* 120* 107* 150*   Radiology Studies: Dg Chest Port 1 View  Result Date: 02/18/2017 CLINICAL DATA:  Hypoxia.  Atrial fibrillation. EXAM: PORTABLE CHEST 1 VIEW COMPARISON:  February 11, 2017. FINDINGS: Lungs are mildly hyperexpanded. There is no edema or consolidation. Heart is mildly enlarged with pulmonary vascularity within normal limits. Prominence in the paratracheal regions is likely due to great vessel prominence in this age group. There is aortic atherosclerosis. Bones are osteoporotic. IMPRESSION: No edema or consolidation. Stable cardiomegaly. There is aortic atherosclerosis. Bones are osteoporotic. Aortic Atherosclerosis (ICD10-I70.0). Electronically Signed   By: Bretta Bang III M.D.   On: 02/18/2017 14:34    Scheduled Meds: . amiodarone  200 mg Oral Daily  . bumetanide  1 mg Oral BID  . calcium-vitamin D  1 tablet Oral Once per day on Mon Wed Fri  . docusate sodium  100 mg Oral BID  . famotidine  20 mg Oral BID  . feeding supplement (ENSURE ENLIVE)  237 mL Oral TID BM  . gabapentin  800 mg Oral TID  . insulin aspart  0-5 Units Subcutaneous QHS  . insulin aspart  0-9 Units Subcutaneous TID WC  . magnesium oxide  400 mg Oral Daily  . midodrine  10 mg Oral TID WC  . multivitamin  1 tablet Oral BID  . nutrition supplement (JUVEN)  1 packet Oral BID BM  . polyethylene glycol  17 g Oral BID  . potassium chloride SA  20 mEq Oral BID  .  protein supplement shake  11 oz Oral Q24H  . rivaroxaban  20 mg Oral Q supper  . sodium chloride flush  3 mL Intravenous Q12H  . sodium chloride flush  3 mL Intravenous Q12H  . tamsulosin  0.4 mg Oral Daily  . tiotropium  18 mcg Inhalation Daily   Continuous Infusions: . sodium chloride 250 mL (02/09/17 1900)  . cefTRIAXone (ROCEPHIN)  IV Stopped (02/19/17 2321)  . metronidazole 500 mg (02/19/17 2200)     LOS: 13 days   Time spent: 25 minutes   Hazeline Junker, MD [facility] Pager [phone]   If 7PM-7AM, please contact night-coverage [email or web address] Password [email or web address] 02/20/2017, 12:29 PM

## 2017-02-20 NOTE — Progress Notes (Signed)
CSW contacted by nursing regarding patient's discharge. CSW has not received [facility] authorization for patient. CSW updated RN. CSW to follow.  Abigail Butts, LCSWA [phone]

## 2017-02-21 LAB — GLUCOSE, CAPILLARY
GLUCOSE-CAPILLARY: 143 mg/dL — AB (ref 65–99)
Glucose-Capillary: 137 mg/dL — ABNORMAL HIGH (ref 65–99)

## 2017-02-21 MED ORDER — PREMIER PROTEIN SHAKE: 11.0000 [oz_av] | ORAL | 0 refills | Status: AC

## 2017-02-21 MED ORDER — JUVEN PO PACK: 1.0000 | PACK | Freq: Two times a day (BID) | ORAL | 0 refills | Status: AC

## 2017-02-21 MED ORDER — FAMOTIDINE 20 MG PO TABS: 20.0000 mg | ORAL_TABLET | Freq: Two times a day (BID) | ORAL | Status: AC

## 2017-02-21 MED ORDER — CEFTRIAXONE IV (FOR PTA / DISCHARGE USE ONLY): 2.0000 g | INTRAVENOUS | 0 refills | Status: AC

## 2017-02-21 MED ORDER — TRAMADOL HCL 50 MG PO TABS: 50.0000 mg | ORAL_TABLET | Freq: Two times a day (BID) | ORAL | 0 refills | Status: DC | PRN

## 2017-02-21 MED ORDER — METRONIDAZOLE IN NACL 500-0.79 MG/100ML-% IV SOLN: 500.0000 mg | Freq: Three times a day (TID) | INTRAVENOUS | 0 refills | Status: AC

## 2017-02-21 NOTE — Discharge Summary (Signed)
Physician Discharge Summary  [name] BPZ:[id number] DOB: 08-14-1926 DOA: 02/07/2017  PCP: Marletta Lor, MD  Admit date: 02/07/2017 Discharge date: 02/21/2017  Admitted From: SNF Disposition:  SNF  Recommendations for Outpatient Follow-up:  1. Follow up with PCP in 1-2 weeks 2. Follow up with Dr. Alvan Dame in the next week (s/p right TKA 01/24/2017). 3. Follow up in wound care clinic   4. Foley care: Placed 10/1 to avoid wound contamination.  Home Health: N/A Equipment/Devices: 1L O2 by nasal cannula, wean as tolerated.  Discharge Condition: Stable CODE STATUS: Partial In the event of cardiac or respiratory ARREST: Initiate Code Blue, Call Rapid Response Yes  In the event of cardiac or respiratory ARREST: Perform CPR Yes  In the event of cardiac or respiratory ARREST: Perform Intubation/Mechanical Ventilation No  In the event of cardiac or respiratory ARREST: Use NIPPV/BiPAp only if indicated Yes  In the event of cardiac or respiratory ARREST: Administer ACLS medications if indicated Yes  In the event of cardiac or respiratory ARREST: Perform Defibrillation or Cardioversion if indicated Yes   Diet recommendation: Heart healthy  Brief/Interim Summary: Pt is 83 yo male with known paroxysmal a-fib on Xarelto, chronic diastolic CHF, hypotension and recently started on Midodrine, s/p TKR 2 weeks prior to this admission, presented to [facility] ED from SNF with fevers, hypotension, severe pain from sacral ulcer. Underwent I&D and requires IV antibiotics per ID recommendations.   Discharge Diagnoses:  Principal Problem:   Sepsis, unspecified organism [facility]) Active Problems:   Essential hypertension   Chronic systolic congestive heart failure (HCC)   S/P total knee replacement   A-fib (HCC)   Infected pressure ulcer   Hypokalemia   Normocytic anemia  Septic shock secondary to osteomyelitis, clostridium and B. fragilis bacteremia due to infected stage IV sacral pressure ulcer: s/p  debridement of sacral ulcer, final wound 10 x 10 x 4 cm deep with 5 cm tunneling bilaterally into the gluteus, exposed bone/periosteum in the base of the wound, 9/25 (Dr. Kae Heller). 9/24 Blood cultures left arm positive for Clostridium and right arm B. fragilis +beta lactamase. Afebrile, resolved leukocytosis.  - Case discussed by Dr. Doyle Askew with Dr. Linus Salmons whe recommended 6 weeks IV antibiotics narrowed down to ceftriaxone/flagyl through PICC placed 10/6. Antibiotics to be continued through 11/6. - Surgery recommends continued hydrotherapy, W->D dressing changes daily, wound clinic follow up, signed off 10/5. - Tramadol prn wound dressing changes. This is providing good analgesia per pt. Consider addition of low dose NSAID prn pain (on allergy list, though daughter states did not have ulcer, just GI upset).  - Ok to use foley to allow for wound healing due to incontinence and chronic diuretic use. - Protein supplementation to aid wound healing.  Persistent atrial fibrillation: CHA2DS2-VASc of 3. Rate well controlled.  - Continue amiodarone and xarelto.   Acute hypoxic respiratory failure: Most likely due to acute on chronic diastolic CHF: Due to IVF resuscitation initially. Weight started at 204lbs (last outpatient cardiology was 205lbs) up to 220lbs 9/26, since bumex was continued at '1mg'$  BID and weight has returned to 203lbs at discharge. - Diuresing well with restart of home bumex. - Repeat CXR showed no significant pulmonary abnormality. Wean O2 as tolerated.    Hypokalemia: Resolved with replacement.  - Monitor BMP in 1 week. - Continue po replacement with loop diuretic.  Anemia, normocytic: Stable.  - Monitor CBC if bleeding noted or in 1 week.  Osteoarthritis s/p right TKA 01/24/2017:  - Continue PT, will  need SNF - Follow up with Dr. Alvan Dame in the next week.    Hypotension: Stable. Not due to sepsis.   - Continue midodrine.   Obesity  - Body mass index is 35.37  kg/m.  Discharge Instructions Discharge Instructions    Home infusion instructions    Complete by:  As directed    Instructions:  Flushing of vascular access device: 0.9% NaCl pre/post medication administration and prn patency; Heparin 100 u/ml, 28m for implanted ports and Heparin 10u/ml, 573mfor all other central venous catheters.     Allergies as of 02/21/2017      Reactions   Nsaids    Stomach pain   Vicodin [hydrocodone-acetaminophen] Other (See Comments)   Unknown reaction many years ago   Morphine And Related Nausea And Vomiting   Dizziness, light headed. "Opiates cause tightness in chest".      Medication List    STOP taking these medications   doxycycline 100 MG tablet Commonly known as:  ADOXA   methocarbamol 500 MG tablet Commonly known as:  ROBAXIN     TAKE these medications   acetaminophen 500 MG tablet Commonly known as:  TYLENOL Take 2 tablets (1,000 mg total) by mouth every 8 (eight) hours.   albuterol 108 (90 Base) MCG/ACT inhaler Commonly known as:  PROVENTIL HFA;VENTOLIN HFA Inhale 2 puffs into the lungs. Every 6 hours as needed for wheezing or shortness of breath   amiodarone 200 MG tablet Commonly known as:  PACERONE TAKE ONE TABLET BY MOUTH ONCE DAILY   bumetanide 1 MG tablet Commonly known as:  BUMEX Take 1 tablet (1 mg total) by mouth 2 (two) times daily.   CALCIUM 600+D 600-400 MG-UNIT tablet Generic drug:  Calcium Carbonate-Vitamin D Take 1 tablet by mouth 3 (three) times a week. Monday Wednesday and Friday   cefTRIAXone IVPB Commonly known as:  ROCEPHIN Inject 2 g into the vein daily. Indication:  Bacteremia/Osteomyelitis Last Day of Therapy:  03/22/2017 Labs - Once weekly:  CBC/D and BMP, Labs - Every other week:  ESR and CRP   cetirizine 10 MG tablet Commonly known as:  ZYRTEC Take 10 mg by mouth daily as needed for allergies.   DSS 100 MG Caps Take 100 mg by mouth 2 (two) times daily.   famotidine 20 MG tablet Commonly known  as:  PEPCID Take 1 tablet (20 mg total) by mouth 2 (two) times daily.   ferrous sulfate 325 (65 FE) MG tablet Commonly known as:  FERROUSUL Take 1 tablet (325 mg total) by mouth 3 (three) times daily with meals.   gabapentin 400 MG capsule Commonly known as:  NEURONTIN TAKE 1 CAPSULE BY MOUTH THREE TIMES DAILY   ICAPS AREDS FORMULA PO Take 1 capsule by mouth 2 (two) times daily.   KLOR-CON M20 20 MEQ tablet Generic drug:  potassium chloride SA TAKE ONE TABLET BY MOUTH ONCE DAILY   Magnesium 250 MG Tabs Take 250 mg by mouth daily.   metronidazole 500-0.79 MG/100ML-% Commonly known as:  FLAGYL Inject 100 mLs (500 mg total) into the vein every 8 (eight) hours.   midodrine 10 MG tablet Commonly known as:  PROAMATINE TAKE ONE TABLET BY MOUTH ONCE DAILY AS NEEDED FOR  SYSTOLIC  BLOOD  PRESSURE  (TOP  NUMBER)  UNDER  90   multivitamin with minerals Tabs tablet Take 1 tablet by mouth daily.   nutrition supplement (JUVEN) Pack Take 1 packet by mouth 2 (two) times daily between meals.   nystatin powder  Generic drug:  nystatin Apply 100,000 g topically. Apply topically two times daily as needed to treat any areas of concern for yeast   polyethylene glycol packet Commonly known as:  MIRALAX / GLYCOLAX Take 17 g by mouth 2 (two) times daily.   protein supplement shake Liqd Commonly known as:  PREMIER PROTEIN Take 325 mLs (11 oz total) by mouth daily.   rivaroxaban 20 MG Tabs tablet Commonly known as:  XARELTO Take 1 tablet (20 mg total) by mouth daily.   rosuvastatin 20 MG tablet Commonly known as:  CRESTOR TAKE ONE TABLET BY MOUTH ONCE A WEEK, FRIDAY   SPIRIVA HANDIHALER 18 MCG inhalation capsule Generic drug:  tiotropium INHALE ONE PUFF BY MOUTH ONCE DAILY   tamsulosin 0.4 MG Caps capsule Commonly known as:  FLOMAX TAKE 1 CAPSULE BY MOUTH ONCE DAILY   traMADol 50 MG tablet Commonly known as:  ULTRAM Take 1-2 tablets (50-100 mg total) by mouth every 12 (twelve)  hours as needed for moderate pain or severe pain (and before hydrotherapy). What changed:  how much to take  when to take this  reasons to take this  additional instructions            Home Infusion Instuctions        Start     Ordered   02/21/17 0000  Home infusion instructions    Question:  Instructions  Answer:  Flushing of vascular access device: 0.9% NaCl pre/post medication administration and prn patency; Heparin 100 u/ml, 16m for implanted ports and Heparin 10u/ml, 564mfor all other central venous catheters.   02/21/17 1239      Contact information for follow-up providers    KwMarletta LorMD Follow up.   Specialty:  Internal Medicine Contact information: [address] [phone]      OlParalee CancelMD .   Specialty:  Orthopedic Surgery Contact information: [phone]      [place]           . Schedule an appointment as soon as possible for a visit in 2 week(s).   Contact information: [phone]         Contact information for after-discharge care    Destination    [facility] Follow up.   Specialty:  [place]: [phone]               Allergies  Allergen Reactions  . Nsaids     Stomach pain  . Vicodin [Hydrocodone-Acetaminophen] Other (See Comments)    Unknown reaction many years ago  . Morphine And Related Nausea And Vomiting    Dizziness, light headed. "Opiates cause tightness in chest".    Consultations:  General surgery  ID  Pharmacy  Procedures/Studies: Dg Pelvis 1-2 Views  Result Date: 02/07/2017 CLINICAL DATA:  Fever.  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 EXAM: PELVIS - 1-2 VIEW COMPARISON:  Pelvis radiograph  07/17/2013 FINDINGS: Left hip arthroplasty in expected alignment. No periprosthetic lucency or fracture. No bony destructive change demonstrated radiographically. There is air in the perineum that may be site of walls are, however is specific. IMPRESSION: Peroneal air is nonspecific and may be secondary to decubitus ulcer. No radiographic evidence of acute osseous abnormality or bony destructive change.  Left hip arthroplasty in place. Electronically Signed   By: Jeb Levering M.D.   On: 02/07/2017 22:21   Dg Chest Port 1 View  Result Date: 02/18/2017 CLINICAL DATA:  Hypoxia.  Atrial fibrillation. EXAM: PORTABLE CHEST 1 VIEW COMPARISON:  February 11, 2017. FINDINGS: Lungs are mildly hyperexpanded. There is no edema or consolidation. Heart is mildly enlarged with pulmonary vascularity within normal limits. Prominence in the paratracheal regions is likely due to great vessel prominence in this age group. There is aortic atherosclerosis. Bones are osteoporotic. IMPRESSION: No edema or consolidation. Stable cardiomegaly. There is aortic atherosclerosis. Bones are osteoporotic. Aortic Atherosclerosis (ICD10-I70.0). Electronically Signed   By: Lowella Grip III M.D.   On: 02/18/2017 14:34   Dg Chest Port 1 View  Result Date: 02/11/2017 CLINICAL DATA:  Shortness of breath. EXAM: PORTABLE CHEST 1 VIEW COMPARISON:  Radiograph of February 07, 2017. FINDINGS: Stable cardiomegaly. Atherosclerosis of thoracic aorta is noted. No pneumothorax is noted. Right lung is clear. Mild left basilar atelectasis is noted with small left pleural effusion. Bony thorax is unremarkable. IMPRESSION: Aortic atherosclerosis. Mild left basilar atelectasis with small left pleural effusion. Electronically Signed   By: Marijo Conception, M.D.   On: 02/11/2017 14:31   Dg Chest Port 1 View  Result Date: 02/07/2017 CLINICAL DATA:  Fever. EXAM: PORTABLE CHEST 1 VIEW COMPARISON:  Radiographs 08/06/2013 FINDINGS: Unchanged heart size and  mediastinal contours with tortuous atherosclerotic thoracic aorta and mild cardiomegaly. No consolidation to suggest pneumonia. Minimal streaky bibasilar atelectasis. No pulmonary edema, large pleural effusion or pneumothorax. The bones are under mineralized. IMPRESSION: 1. No acute abnormality or explanation for fever. 2. Chronic cardiomegaly and tortuous atherosclerotic thoracic aorta. Electronically Signed   By: Jeb Levering M.D.   On: 02/07/2017 22:19   OR for I&D 9/25   RUE PICC placement 10/6  Subjective: Feels well. Backside is sore, but pain controlled with ultram. No fever or chest pain or dyspnea.   Discharge Exam: BP (!) 108/41 (BP Location: Left Arm)   Pulse 72   Temp 98.2 F (36.8 C) (Oral)   Resp 20   Ht '5\' 6"'$  (1.676 m)   Wt 92.3 kg (203 lb 7.8 oz)   SpO2 92%   BMI 32.84 kg/m   General: Pt is alert, awake, not in acute distress Cardiovascular: RRR, S1/S2 +, no rubs, no gallops Respiratory: Clear with some transmitted upper airway sounds. Nonlabored.  Abdominal: Soft, NT, ND, bowel sounds + Extremities: No edema, no cyanosis Skin: ~7 x 7 x 4cm open wound with undermining on sacrum with granulation tissue, no purulence or bleeding. +Probe to bone. RUE PICC in place c/d/i without erythema.   Neuro: A&Ox3, CN II-XII without deficits. Has periodic jerking limb movements at rest and with intention bilaterally. These are very mild on day of discharge.  Labs: Basic Metabolic Panel:  Recent Labs Lab 02/16/17 0512 02/17/17 0510 02/18/17 0421 02/19/17 0507 02/20/17 0359  NA 135 138 140 142 141  K 4.8 4.6 4.3 4.1 3.8  CL 91* 92* 95* 96* 95*  CO2 35* 39* 38* 39* 40*  GLUCOSE 100* 99 114* 113* 105*  BUN 19 21* 28* 24* 23*  CREATININE 0.72 0.70 0.68 0.62 0.69  CALCIUM 8.7* 8.7* 8.3* 8.9 8.7*   CBC:  Recent Labs Lab 02/15/17 0512 02/16/17 0512 02/17/17 0510 02/18/17 0421  WBC 11.3* 11.6* 10.0 10.1  HGB 8.6* 8.6* 8.3* 8.1*  HCT 26.6* 26.2* 25.6* 25.8*  MCV  97.1 96.7 97.7 99.2  PLT 435* 416* 362 338   CBG:  Recent Labs Lab 02/20/17 1146 02/20/17 1809 02/20/17 2031 02/21/17 0733 02/21/17 1149  GLUCAP 150* 118* 148* 137* 143*    Time coordinating discharge: Approximately 40 minutes  Vance Gather, MD  [facility] 02/21/2017, 12:40 PM Pager [phone]

## 2017-02-21 NOTE — Progress Notes (Signed)
   02/21/17 1200 Hydrotherapy note  Subjective Assessment  Subjective They gave me something to breathe to help my pain  Patient and Family Stated Goals I am leaving today  Evaluation and Treatment  Evaluation and Treatment Procedures Explained to Patient/Family Yes  Evaluation and Treatment Procedures agreed to  Wound / Incision (Open or Dehisced) 02/09/17 Other (Comment) Sacrum Medial lareg openn  nstage 4 wound, post surgical debridement.  Date First Assessed/Time First Assessed: 02/09/17 1448   Wound Type: Other (Comment)  Location: Sacrum  Location Orientation: Medial  Wound Description (Comments): lareg openn  nstage 4 wound, post surgical debridement.  Dressing Type Gauze (Comment);ABD;Barrier Film (skin prep) (Packing strips; Saline dampened Kerlix)  Dressing Changed Changed  Dressing Status Old drainage (soiled-BM)  Dressing Change Frequency Twice a day  Site / Wound Assessment Pink;Red;Brown;Yellow  % Wound base Red or Granulating 75%  % Wound base Yellow/Fibrinous Exudate 10%  % Wound base Other/Granulation Tissue (Comment) 15% (bone/connective tissue)  Peri-wound Assessment Erythema (blanchable);Excoriated;Maceration  Margins Unattached edges (unapproximated)  Drainage Amount Moderate  Drainage Description Serosanguineous  Non-staged Wound Description Full thickness  Treatment Cleansed;Hydrotherapy (Pulse lavage);Packing (Saline gauze) (did not pack tunnel on left gluteus)  Hydrotherapy  Pulsed lavage therapy - wound location sacrum  Pulsed Lavage with Suction (psi) 8 psi  Pulsed Lavage with Suction - Normal Saline Used 1000 mL  Pulsed Lavage Tip Tip with splash shield (tunnel tip)  Wound Therapy - Assess/Plan/Recommendations  Wound Therapy - Clinical Statement The  wound has increased  granulation tissue, continues with bone and small bone fragment in wound base. surrounding skin appears reddened today. has reported increased loose BM's at times. May be able to  consider decreasing frequency. Plans SNF today.  Wound Therapy - Functional Problem List significantly impaired mobility  pre and post LTKA.  Factors Delaying/Impairing Wound Healing Immobility;Multiple medical problems;Incontinence  Hydrotherapy Plan Debridement;Dressing change;Patient/family education;Pulsatile lavage with suction  Wound Therapy - Frequency 6X / week  Wound Therapy - Current Recommendations PT;Case manager/social work  Wound Therapy - Follow Up Recommendations Skilled nursing facility  Wound Plan PLS  and dressing chnages, debridement  Wound Therapy Goals - Improve the function of patient's integumentary system by progressing the wound(s) through the phases of wound healing by:  Decrease Necrotic Tissue to 10  Decrease Necrotic Tissue - Progress Progressing toward goal  Increase Granulation Tissue to 90  Increase Granulation Tissue - Progress Progressing toward goal  Improve Drainage Characteristics Min  Improve Drainage Characteristics - Progress Progressing toward goal  Patient/Family will be able to  reposition off of the sacrum  Goals/treatment plan/discharge plan were made with and agreed upon by patient/family Yes  Time For Goal Achievement 2 weeks  Wound Therapy - Potential for Goals [name] PT [phone]

## 2017-02-21 NOTE — Care Management Note (Signed)
Case Management Note  Patient Details  Name: [name] MRN: [id number] Date of Birth: 09/07/1926  Subjective/Objective:                    Action/Plan:d/c SNF.   Expected Discharge Date:  02/21/17               Expected Discharge Plan:  Skilled Nursing Facility  In-House Referral:  Clinical Social Work  Discharge planning Services  CM Consult  Post Acute Care Choice:    Choice offered to:     DME Arranged:    DME Agency:     HH Arranged:    HH Agency:     Status of Service:  Completed, signed off  If discussed at [employer], dates discussed:    Additional Comments:  Lanier Clam, RN 02/21/2017, 11:48 AM

## 2017-02-21 NOTE — Progress Notes (Signed)
PHARMACY CONSULT NOTE FOR:  OUTPATIENT  PARENTERAL ANTIBIOTIC THERAPY (OPAT)  Indication: Bacteremia, osteomyelitis Regimen: Rocephin 2g IV q24 hr and Flagyl 500 mg IV q8 hr End date: 03/22/2017  IV antibiotic discharge orders are pended. To discharging provider:  please sign these orders via discharge navigator,  Select New Orders & click on the button choice - Manage This Unsigned Work.     Thank you for allowing pharmacy to be a part of this patient's care.  Bernadene Person, PharmD, BCPS Pager: [phone] 02/21/2017, 12:00 PM

## 2017-02-21 NOTE — Progress Notes (Signed)
Nutrition Follow-up  DOCUMENTATION CODES:   Obesity unspecified  INTERVENTION:  - Continue current nutrition supplements. - Continue to encourage PO intakes of meals and supplements. - RD will continue to monitor for additional needs should pt be unable to d/c to SNF today.   NUTRITION DIAGNOSIS:   Increased nutrient needs related to wound healing as evidenced by estimated needs. -ongoing  GOAL:   Patient will meet greater than or equal to 90% of their needs -met on average.   MONITOR:   PO intake, Supplement acceptance, Weight trends, Labs, Skin  ASSESSMENT:   83 y.o. male with medical history significant for paroxysmal a. fib, chronic diastolic CHF, hypotension, and osteoarthritis status post TKA on 01/24/17. Presented to the ED from SNF for evaluation of fevers, low blood pressure, and severe pain from a sacral ulcer. Patient is accompanied by his daughter who assists with the history. He had reportedly developed a small red spot on his sacrum that was noted during his recent hospitalization for knee surgery. Since his discharge to a SNF, there is been skin breakdown and an expanding ulcer. Family reports noting that the ulcer has developed some black tissue, drainage, and without foul odor. Patient has been febrile at his nursing facility.  10/8 Pt has been consuming 25-50% of meals, on average, since previous assessment. He has accepted Ensure all but 4 times, Juven all but 4 times, and Premier Protein all but 1 time since 10/1. Discharge order and summary for SNF in place at this time. Weight has been trending down and is now consistent with admission weight.   Medications reviewed; 1 tablet Oscal-D TID, 100 mg Colace BID, 20 mg oral Pepcid BID, sliding scale Novolog, 400 Mag-ox/day, 1 tablet Prosight BID, 1 packet Miralax BID, 20 mEq oral KCl BID.  Labs reviewed; CBGs: 137 and 143 mg/dL today. Yesterday: Cl: 95 mmol/L, BUN: 23 mg/dL, Ca: 8.7 mg/dL.    10/1 - Pt consumed 45%  of dinner (300 kcal and 11 grams of protein) on 9/27. - He only wanted Ensure for all three meals on 9/29;  - He only wanted Ensure for breakfast, consumed 50% of lunch (325 kcal and 18 grams of protein) and 30% of dinner (245 kcal and 11 grams of protein) on 9/30.  - Will increase Ensure order and add Juven; continue Premier Protein. - Pt receiving hydrotherapy for wound for plan to continue this after d/c. S/p debridement of wound.    9/26 - Pt was made NPO yesterday AM for I&D of pressure injury.  - Diet advanced to CLD yesterday evening and then to Heart Healthy this AM.  - Pt with good appetite at baseline/PTA. - Increased protein needs d/t wound and surgical intervention. - No chewing or swallowing issues noted.  - Physical assessment shows no muscle and no fat wasting, mild edema to LUE and LLE.  - Per chart review, some weight fluctuation over the past 9 months (203-216 lbs since 05/28/16). Current weight is the highest and lowest weight was on the day of TKA. Will monitor weight trends closely.    Diet Order:  Diet Heart Room service appropriate? Yes; Fluid consistency: Thin  Skin:    Stage 4 sacral pressure injury  Last BM:  10/7  Height:   Ht Readings from Last 1 Encounters:  02/09/17 _0  (1.676 m)    Weight:   Wt Readings from Last 1 Encounters:  02/21/17 203 lb 7.8 oz (92.3 kg)    Ideal Body Weight:  64.54  kg  BMI:  Body mass index is 32.84 kg/m.  Estimated Nutritional Needs:   Kcal:  1959-7471 (18-20 kcal/kg)  Protein:  100-118 grams (1-1.2 grams/kg)  Fluid:  >/= 2 L/day  EDUCATION NEEDS:   No education needs identified at this time    Jarome Matin, MS, RD, LDN, CNSC Inpatient Clinical Dietitian Pager # [phone] After hours/weekend pager # [phone]

## 2017-02-21 NOTE — Progress Notes (Signed)
CSW contacted [employer] and spoke with staff member Thamas Jaegers ext. [phone]) she reported patient received insurance authorization effective 02/21/17. Auth # [id number] New update due 10/10 Covered under nursing skill for IV antibiotics  CSW provided update to patient's selected SNF [facility]. CSW awaiting return call to see what time they are able to accept patient.   CSW provided update to patient's daughter.  CSW will continue to follow and assist with discharge planning.   Celso Sickle, [place] Clinical Social Worker [facility] Cell#: [phone]

## 2017-02-21 NOTE — Clinical Social Work Placement (Signed)
Patient received and accepted bed offer at [facility]. Facility aware of patient's discharge and confirmed bed offer. PTAR contacted and coordinated for 4:30PM, per facility request. Patient's RN can call report to [phone], packet complete. CSW signing off, no other needs identified at this time.   CLINICAL SOCIAL WORK PLACEMENT  NOTE  Date:  02/21/2017  Patient Details  Name: [name] MRN: [id number] Date of Birth: [birth year]  Clinical Social Work is seeking post-discharge placement for this patient at the Skilled  Nursing Facility level of care (*CSW will initial, date and re-position this form in  chart as items are completed):  Yes   Patient/family provided with [facility] Clinical Social Work Department's list of facilities offering this level of care within the geographic area requested by the patient (or if unable, by the patient's family).  Yes   Patient/family informed of their freedom to choose among providers that offer the needed level of care, that participate in Medicare, Medicaid or managed care program needed by the patient, have an available bed and are willing to accept the patient.  Yes   Patient/family informed of [facility]'s ownership interest in [facility] and [facility], as well as of the fact that they are under no obligation to receive care at these facilities.  PASRR submitted to EDS on       PASRR number received on       Existing PASRR number confirmed on 02/11/17     FL2 transmitted to all facilities in geographic area requested by pt/family on 02/11/17     FL2 transmitted to all facilities within larger geographic area on       Patient informed that his/her managed care company has contracts with or will negotiate with certain facilities, including the following:        Yes   Patient/family informed of bed offers received.  Patient chooses bed at [facility]     Physician recommends and patient chooses bed at      Patient  to be transferred to [place] on 02/21/17.  Patient to be transferred to facility by PTAR     Patient family notified on 02/21/17 of transfer.  Name of family member notified:  [facility]     PHYSICIAN       Additional Comment:    _______________________________________________ [name], LCSW 02/21/2017, 3:36 PM

## 2017-04-13 ENCOUNTER — Encounter: Payer: Self-pay | Admitting: Gastroenterology

## 2017-04-14 ENCOUNTER — Other Ambulatory Visit: Payer: Self-pay | Admitting: Physician Assistant

## 2017-04-14 DIAGNOSIS — M869 Osteomyelitis, unspecified: Secondary | ICD-10-CM

## 2017-04-15 ENCOUNTER — Ambulatory Visit (HOSPITAL_COMMUNITY)
Admission: RE | Admit: 2017-04-15 | Discharge: 2017-04-15 | Disposition: A | Discharge status: Home / Self Care | Payer: Medicare Other | Source: Ambulatory Visit | Attending: Physician Assistant | Admitting: Physician Assistant

## 2017-04-15 ENCOUNTER — Other Ambulatory Visit: Payer: Self-pay | Admitting: Physician Assistant

## 2017-04-15 ENCOUNTER — Encounter (HOSPITAL_COMMUNITY): Payer: Self-pay | Admitting: Radiology

## 2017-04-15 DIAGNOSIS — M869 Osteomyelitis, unspecified: Secondary | ICD-10-CM | POA: Insufficient documentation

## 2017-04-15 HISTORY — PX: IR US GUIDE VASC ACCESS RIGHT: IMG2390

## 2017-04-15 HISTORY — PX: IR FLUORO GUIDE CV LINE RIGHT: IMG2283

## 2017-04-15 MED ORDER — LIDOCAINE HCL 1 % IJ SOLN
INTRAMUSCULAR | Status: AC
  Filled 2017-04-15: qty 20

## 2017-04-15 MED ORDER — LIDOCAINE HCL 1 % IJ SOLN
INTRAMUSCULAR | Status: DC | PRN
  Administered 2017-04-15: 5 mL

## 2017-04-15 MED ORDER — HEPARIN SOD (PORK) LOCK FLUSH 100 UNIT/ML IV SOLN
INTRAVENOUS | Status: AC
  Filled 2017-04-15: qty 5

## 2017-04-16 HISTORY — PX: OTHER SURGICAL HISTORY: SHX169

## 2017-04-20 ENCOUNTER — Ambulatory Visit (INDEPENDENT_AMBULATORY_CARE_PROVIDER_SITE_OTHER): Payer: Medicare Other | Admitting: Internal Medicine

## 2017-04-20 ENCOUNTER — Encounter: Payer: Medicare Other | Attending: Internal Medicine | Admitting: Internal Medicine

## 2017-04-20 ENCOUNTER — Other Ambulatory Visit
Admission: RE | Admit: 2017-04-20 | Discharge: 2017-04-20 | Disposition: A | Discharge status: Home / Self Care | Payer: Medicare Other | Source: Ambulatory Visit | Attending: Internal Medicine | Admitting: Internal Medicine

## 2017-04-20 ENCOUNTER — Encounter: Payer: Self-pay | Admitting: Internal Medicine

## 2017-04-20 DIAGNOSIS — I482 Chronic atrial fibrillation: Secondary | ICD-10-CM | POA: Diagnosis not present

## 2017-04-20 DIAGNOSIS — M869 Osteomyelitis, unspecified: Secondary | ICD-10-CM | POA: Diagnosis not present

## 2017-04-20 DIAGNOSIS — M8668 Other chronic osteomyelitis, other site: Secondary | ICD-10-CM | POA: Insufficient documentation

## 2017-04-20 DIAGNOSIS — M1712 Unilateral primary osteoarthritis, left knee: Secondary | ICD-10-CM | POA: Diagnosis not present

## 2017-04-20 DIAGNOSIS — Z886 Allergy status to analgesic agent status: Secondary | ICD-10-CM | POA: Insufficient documentation

## 2017-04-20 DIAGNOSIS — Z87891 Personal history of nicotine dependence: Secondary | ICD-10-CM | POA: Diagnosis not present

## 2017-04-20 DIAGNOSIS — N400 Enlarged prostate without lower urinary tract symptoms: Secondary | ICD-10-CM | POA: Diagnosis not present

## 2017-04-20 DIAGNOSIS — L89154 Pressure ulcer of sacral region, stage 4: Secondary | ICD-10-CM | POA: Insufficient documentation

## 2017-04-20 DIAGNOSIS — I509 Heart failure, unspecified: Secondary | ICD-10-CM | POA: Diagnosis not present

## 2017-04-20 DIAGNOSIS — I110 Hypertensive heart disease with heart failure: Secondary | ICD-10-CM | POA: Diagnosis not present

## 2017-04-20 DIAGNOSIS — Z7901 Long term (current) use of anticoagulants: Secondary | ICD-10-CM | POA: Diagnosis not present

## 2017-04-20 DIAGNOSIS — M1990 Unspecified osteoarthritis, unspecified site: Secondary | ICD-10-CM | POA: Insufficient documentation

## 2017-04-20 DIAGNOSIS — B999 Unspecified infectious disease: Secondary | ICD-10-CM | POA: Insufficient documentation

## 2017-04-20 DIAGNOSIS — J449 Chronic obstructive pulmonary disease, unspecified: Secondary | ICD-10-CM | POA: Insufficient documentation

## 2017-04-20 DIAGNOSIS — H3530 Unspecified macular degeneration: Secondary | ICD-10-CM | POA: Insufficient documentation

## 2017-04-20 NOTE — Progress Notes (Signed)
[facility] for Infectious Disease  Reason for Consult: coccygeal osteomyelitis Referring Physician: Dr. Hebert Soho  Assessment: Mr. Tupper has a sacral pressure ulcer that became infected. He was treated for presumed osteomyelitis after his recent hospitalization on the basis of a grossly infected wound and exposed bone He was bacteremic at that time and cultures revealed a polymicrobial mixed aerobic and anaerobic infection. He was seen at the wound center in [place], [place] this morning by Dr. Dossie Der. I called and spoke with Dr. Dellia Nims today. He told me that there was a significant amount of necrotic bone and tissue that were debrided today. Specimens were submitted for pathologic review, stain and culture. I will check CBC, BMP, sedimentation rate and C-reactive protein today. I feel it is best to hold off on selecting appropriate antibiotic therapy until we have results of cultures. Once these are available I will call his skilled nursing facility to give antibiotic orders. He will probably need another 4-6 weeks of therapy.  Plan: 1. I talked to him about improving his nutritional status 2. Continue wound care and offloading of pressure as much as possible 3. Continue physical therapy  4. CBC, BMP, ESR and CRP today 5. I will give recommendations about antibiotic therapy once final culture results are available in the next 4-5 days 6. Follow-up here in 6 weeks  Patient Active Problem List   Diagnosis Date Noted  . 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 Infected pressure ulcer 02/07/2017    Priority: High  . Normocytic anemia 02/07/2017  . A-fib ([place]) 01/31/2017  . Polyneuropathy 01/31/2017  . S/P left TKA 01/24/2017  . Non-ischemic cardiomyopathy ([place]) 10/15/2016  . Chronic systolic congestive heart failure ([place]) 05/28/2016  . Orthostatic hypotension 09/20/2015  . Impaired glucose tolerance 11/25/2014  . Bifascicular  block 12/18/2013  . Bilateral edema of lower extremity 10/25/2013  . S/P left THA, AA 08/13/2013  . Chronic anticoagulation 12/06/2012  . General weakness 07/05/2012  . Paroxysmal Atrial fibrillation - admitted with RVR 01/18/2012  . DJD (degenerative joint disease), lumbar 01/18/2012  . Hx of colonic polyps 11/02/2011  . BENIGN PROSTATIC HYPERTROPHY 05/13/2010  . SEBORRHEA CAPITIS 02/13/2010  . CHRONIC RHINITIS 06/02/2009  . Hyperlipidemia 11/01/2008  . INSOMNIA, CHRONIC, MILD 01/02/2008  . Essential hypertension 02/06/2007  . ASTHMA 02/06/2007      Medication List        Accurate as of 04/20/17  2:23 PM. Always use your most recent med list.          acetaminophen 500 MG tablet Commonly known as:  TYLENOL Take 2 tablets (1,000 mg total) by mouth every 8 (eight) hours.   albuterol 108 (90 Base) MCG/ACT inhaler Commonly known as:  PROVENTIL HFA;VENTOLIN HFA   amiodarone 200 MG tablet Commonly known as:  PACERONE TAKE ONE TABLET BY MOUTH ONCE DAILY   bumetanide 1 MG tablet Commonly known as:  BUMEX Take 1 tablet (1 mg total) by mouth 2 (two) times daily.   CALCIUM 600+D 600-400 MG-UNIT tablet Generic drug:  Calcium Carbonate-Vitamin D   cetirizine 10 MG tablet Commonly known as:  ZYRTEC   DSS 100 MG Caps Take 100 mg by mouth 2 (two) times daily.   famotidine 20 MG tablet Commonly known as:  PEPCID Take 1 tablet (20 mg total) by mouth 2 (two) times daily.   ferrous sulfate 325 (65 FE) MG tablet Commonly known as:  FERROUSUL Take  1 tablet (325 mg total) by mouth 3 (three) times daily with meals.   gabapentin 400 MG capsule Commonly known as:  NEURONTIN TAKE 1 CAPSULE BY MOUTH THREE TIMES DAILY   ICAPS AREDS FORMULA PO   KLOR-CON M20 20 MEQ tablet Generic drug:  potassium chloride SA TAKE ONE TABLET BY MOUTH ONCE DAILY   Magnesium 250 MG Tabs   midodrine 10 MG tablet Commonly known as:  PROAMATINE TAKE ONE TABLET BY MOUTH ONCE DAILY AS NEEDED FOR   SYSTOLIC  BLOOD  PRESSURE  (TOP  NUMBER)  UNDER  90   multivitamin with minerals Tabs tablet   nutrition supplement (JUVEN) Pack Take 1 packet by mouth 2 (two) times daily between meals.   nystatin powder Generic drug:  nystatin   polyethylene glycol packet Commonly known as:  MIRALAX / GLYCOLAX Take 17 g by mouth 2 (two) times daily.   protein supplement shake Liqd Commonly known as:  PREMIER PROTEIN Take 325 mLs (11 oz total) by mouth daily.   rivaroxaban 20 MG Tabs tablet Commonly known as:  XARELTO Take 1 tablet (20 mg total) by mouth daily.   rosuvastatin 20 MG tablet Commonly known as:  CRESTOR TAKE ONE TABLET BY MOUTH ONCE A WEEK, FRIDAY   SPIRIVA HANDIHALER 18 MCG inhalation capsule Generic drug:  tiotropium INHALE ONE PUFF BY MOUTH ONCE DAILY   tamsulosin 0.4 MG Caps capsule Commonly known as:  FLOMAX TAKE 1 CAPSULE BY MOUTH ONCE DAILY   traMADol 50 MG tablet Commonly known as:  ULTRAM Take 1-2 tablets (50-100 mg total) by mouth every 12 (twelve) hours as needed for moderate pain or severe pain (and before hydrotherapy).       HPI: [name] is a 83 y.o. male who underwent left total knee arthroplastyon 01/24/2017. He was discharged to a skilled nursing facility 4 days later.He was readmitted on 02/07/2017 with fever, hypotension and a foul-smelling infected sacral wound. Admission blood cultures grew Clostridium ramosum and Bacteroides fragilis. He underwent debridement of the wound on 02/08/2017 The operative note indicated that it was a stage IV pressure ulcer with tunneling bilaterally into the gluteus muscles with exposed bone. Swab specimens of wound drainage showed gram-negative rods, gram-positive cocci and gram-positive rods on Gram stain and cultures grew multiple organisms. He received intravenous antibiotics the hospital. He was discharged on 02/21/2017 back to a skilled nursing facility to receive hydrotherapy and IV ceftriaxone and metronidazole.  His discharge note indicates that antibiotics were to continue through 03/22/2017. He believes that to be correct. His first PICC line was removed. One note from his facility on 04/14/2017 composed by Jeri Cos PA noted that the "wound does not appear to be doing terribly". However because of persistent exposed bone and x-ray was done which showed new changes in the distal coccyx compatible with acute osteomyelitis. He had a second PICC line placed on 04/15/2017. He was referred to the wound center in [place], [place] and here as well. He denies any recent fever, chills or sweats. He says that he has been trying to lose some weight. Records indicate that he's lost 10-12 pounds in the past 3 months. Records also note that there has been some discussion about possible diverting colostomy and wound VAC therapy but it appears that neither of these interventions were felt to be feasible at this time.  Review of Systems: Review of Systems  Constitutional: Positive for malaise/fatigue and weight loss. Negative for chills, diaphoresis and fever.  HENT: Negative for  sore throat.   Respiratory: Negative for cough, sputum production and shortness of breath.   Cardiovascular: Negative for chest pain.  Gastrointestinal: Negative for abdominal pain, diarrhea, heartburn, nausea and vomiting.  Genitourinary: Negative for dysuria and frequency.  Musculoskeletal: Negative for joint pain and myalgias.       His wound is painful.  Skin: Negative for rash.       As noted in history of present illness.  Neurological: Positive for weakness. Negative for dizziness and headaches.      Past Medical History:  Diagnosis Date  . A-fib ([place])   . Acute systolic CHF (congestive heart failure), NYHA class 3 -- Unclear etilogy (? Afib related) EF down from 60-70% to 40%. 07/05/2012   Echo 2/21: LV upper limits of normal. EF- 40%. Cannot assess Diastolic function - Aortic Sclerosis, Mod-Severe Left Atrial dilation;  Mild RV & RA dilation; Moderately elevated PA peak pressure: 54m Hg     . Arthritis    knees  . Asthma   . ASTHMA 02/06/2007   Qualifier: Diagnosis of  By: CRogue BussingCMA, JMaryann Alar   . BENIGN PROSTATIC HYPERTROPHY 05/13/2010   Qualifier: Diagnosis of  By: KBurnice Logan MD, PDoretha Sou  . Bifascicular block 12/18/2013  . BPH (benign prostatic hypertrophy)   . Bronchospasm, exercise-induced   . CHF (congestive heart failure) (HCC)    systolic, EF 483%(23/82/5053  . Chronic kidney disease    stabilized, due to infection  . Chronic rhinitis 06/02/2009   Qualifier: Diagnosis of  By: JArnoldo MoraleMD, JBalinda Quails  . DJD (degenerative joint disease), lumbar 01/18/2012  . Dyslipidemia   . Edema    Bilateral - left lower leg greater than right- wears compression stockings  . General weakness 07/05/2012  . GERD (gastroesophageal reflux disease)   . History of elevated glucose 10/01/2009   Qualifier: Diagnosis of  By: JArnoldo MoraleMD, JBalinda Quails  . Hx of colonic polyps 11/02/2011  . Hypertension   . INSOMNIA, CHRONIC, MILD 01/02/2008   Qualifier: Diagnosis of  By: JArnoldo MoraleMD, JBalinda Quails  . Macular degeneration 08-06-13   bilateral -sight impaired  . Obesity   . Paroxysmal Atrial fibrillation - admitted with RVR 01/18/2012  . S/P left THA, AA 08/13/2013  . Shortness of breath     Social History   Tobacco Use  . Smoking status: Former Smoker    Last attempt to quit: 05/17/1964    Years since quitting: 52.9  . Smokeless tobacco: Never Used  Substance Use Topics  . Alcohol use: No  . Drug use: No    Family History  Problem Relation Age of Onset  . Alzheimer's disease Mother   . Heart disease Father    Allergies  Allergen Reactions  . Nsaids     Stomach pain  . Vicodin [Hydrocodone-Acetaminophen] Other (See Comments)    Unknown reaction many years ago  . Morphine And Related Nausea And Vomiting    Dizziness, light headed. "Opiates cause tightness in chest".    OBJECTIVE: Vitals:   04/20/17 1345  BP: (!)  81/53  Pulse: 80  Temp: 97.7 F (36.5 C)  TempSrc: Oral   There is no height or weight on file to calculate BMI.   Physical Exam  Constitutional: He is oriented to person, place, and time.  He is seated in a wheelchair. He is alert and very pleasant.  Cardiovascular: Normal rate and regular rhythm.  No murmur heard. Pulmonary/Chest: Effort normal and breath sounds  normal. He has no wheezes. He has no rales.  Abdominal: Soft. He exhibits no distension. There is no tenderness.  Musculoskeletal:  His left knee incision is healed nicely without evidence of infection.  He asked that I not trying to examine his sacral wound today.  Neurological: He is alert and oriented to person, place, and time.  Skin: No rash noted.  Right arm PICC site looks good.  Psychiatric: Mood and affect normal.    Microbiology: Recent Results (from the past 240 hour(s))  Aerobic Culture (superficial specimen)     Status: None (Preliminary result)   Collection Time: 04/20/17  9:06 AM  Result Value Ref Range Status   Specimen Description BONE  Final   Special Requests   Final    COCCYX Performed at [facility], [address], [place], [place] [address]    Gram Stain PENDING  Incomplete   Culture PENDING  Incomplete   Report Status PENDING  Incomplete    Michel Bickers, MD [facility] [place] [phone] pager   [phone] cell 04/20/2017, 2:23 PM

## 2017-04-21 LAB — CBC
HCT: 32.9 % — ABNORMAL LOW (ref 38.5–50.0)
HEMOGLOBIN: 10.6 g/dL — AB (ref 13.2–17.1)
MCH: 29.8 pg (ref 27.0–33.0)
MCHC: 32.2 g/dL (ref 32.0–36.0)
MCV: 92.4 fL (ref 80.0–100.0)
MPV: 9.9 fL (ref 7.5–12.5)
PLATELETS: 324 10*3/uL (ref 140–400)
RBC: 3.56 10*6/uL — AB (ref 4.20–5.80)
RDW: 13.5 % (ref 11.0–15.0)
WBC: 8.1 10*3/uL (ref 3.8–10.8)

## 2017-04-21 LAB — C-REACTIVE PROTEIN: CRP: 101.7 mg/L — ABNORMAL HIGH (ref <8.0)

## 2017-04-21 LAB — SEDIMENTATION RATE: SED RATE: 87 mm/h — AB (ref 0–20)

## 2017-04-21 LAB — BASIC METABOLIC PANEL
BUN / CREAT RATIO: 30 (calc) — AB (ref 6–22)
BUN: 19 mg/dL (ref 7–25)
CO2: 32 mmol/L (ref 20–32)
CREATININE: 0.64 mg/dL — AB (ref 0.70–1.11)
Calcium: 9.2 mg/dL (ref 8.6–10.3)
Chloride: 98 mmol/L (ref 98–110)
GLUCOSE: 138 mg/dL — AB (ref 65–99)
Potassium: 4.1 mmol/L (ref 3.5–5.3)
SODIUM: 139 mmol/L (ref 135–146)

## 2017-04-21 LAB — SURGICAL PATHOLOGY

## 2017-04-21 NOTE — Progress Notes (Signed)
[name], [name] ChristmasVERETT E. ([id number]) Visit Report for 04/20/2017 Abuse/Suicide Risk Screen Details Patient Name: [name], [name] Date of Service: 04/20/2017 8:00 AM Medical Record Number: [id number] Patient Account Number: [id number] Date of Birth/Sex: November 03, 1926 [age] y.o. Male) Treating RN: [name] Primary Care [name] Other Clinician: Referring [name] Treating Lyndsi Altic/Extender: [name] Weeks in Treatment: 0 Abuse/Suicide Risk Screen Items Answer ABUSE/SUICIDE RISK SCREEN: Has anyone close to you tried to hurt or harm you recentlyo No Do you feel uncomfortable with anyone in your familyo No Has anyone forced you do things that you didnot want to doo No Do you have any thoughts of harming yourselfo No Patient displays signs or symptoms of abuse and/or neglect. No Electronic Signature(s) Signed: 04/20/2017 5:06:00 PM By: [name] Entered By: [name] on 04/20/2017 08:20:03 [name], [name] Bea LauraE. ([id number]) -------------------------------------------------------------------------------- Activities of Daily Living Details Patient Name: [name], [name] Date of Service: 04/20/2017 8:00 AM Medical Record Number: [id number] Patient Account Number: [id number] Date of Birth/Sex: November 03, 1926 [age] y.o. Male) Treating RN: [name] Primary Care Camara Rosander: Eleonore ChiquitoKWIATKOWSKI, [name] Other Clinician: Referring Kathrin Folden: Dierdre HighmanARNAEZ ZAPATA, [name] Treating Segundo Makela/Extender: [name] Weeks in Treatment: 0 Activities of Daily Living Items Answer Activities of Daily Living (Please select one for each item) Drive Automobile Not Able Take Medications Need Assistance Use Telephone Need Assistance Care for Appearance Need Assistance Use Toilet Need Assistance Bath / Shower Need Assistance Dress Self Need Assistance Feed Self Completely Able Walk Not Able Get In / Out Bed Need Assistance Housework Not Able Prepare Meals Not  Able Handle Money Need Assistance Shop for Self Need Assistance Electronic Signature(s) Signed: 04/20/2017 5:06:00 PM By: [name] Entered By: [name] on 04/20/2017 08:20:33 Leavey, [name] ChristmasEVERETT E. ([id number]) -------------------------------------------------------------------------------- Education Assessment Details Patient Name: [name], [name] Date of Service: 04/20/2017 8:00 AM Medical Record Number: [id number] Patient Account Number: [id number] Date of Birth/Sex: November 03, 1926 [age] y.o. Male) Treating RN: [name] Primary Care Adrienna Karis: Eleonore ChiquitoKWIATKOWSKI, [name] Other Clinician: Referring Meika Earll: Dierdre HighmanARNAEZ ZAPATA, [name] Treating Marston Mccadden/Extender: Altamese CarolinaOBSON, MICHAEL [name] Weeks in Treatment: 0 Primary Learner Assessed: Caregiver SNF nurses Learning Preferences/Education Level/Primary Language Learning Preference: Printed Material Highest Education Level: College or Above Preferred Language: English Cognitive Barrier Assessment/Beliefs Language Barrier: No Translator Needed: No Memory Deficit: No Emotional Barrier: No Cultural/Religious Beliefs Affecting Medical Care: No Physical Barrier Assessment Impaired Vision: No Impaired Hearing: No Decreased Hand dexterity: No Knowledge/Comprehension Assessment Knowledge Level: Medium Comprehension Level: Medium Ability to understand written Medium instructions: Ability to understand verbal Medium instructions: Motivation Assessment Anxiety Level: Calm Cooperation: Cooperative Education Importance: Acknowledges Need Interest in Health Problems: Asks Questions Perception: Coherent Willingness to Engage in Self- Medium Management Activities: Readiness to Engage in Self- Medium Management Activities: Electronic Signature(s) Signed: 04/20/2017 5:06:00 PM By: [name] Entered By: [name] on 04/20/2017 08:21:16 Bloor, [name] ChristmasEVERETT E.  ([id number]) -------------------------------------------------------------------------------- Fall Risk Assessment Details Patient Name: [name], [name] Date of Service: 04/20/2017 8:00 AM Medical Record Number: [id number] Patient Account Number: [id number] Date of Birth/Sex: November 03, 1926 [age] y.o. Male) Treating RN: [name] Primary Care Shivonne Schwartzman: Eleonore ChiquitoKWIATKOWSKI, [name] Other Clinician: Referring Kadan Millstein: Durenda HurtARNAEZ ZAPATA, Rexford MausGERARDO Treating Crystie Yanko/Extender: Altamese CarolinaOBSON, MICHAEL [name] Weeks in Treatment: 0 Fall Risk Assessment Items Have you had 2 or more falls in the last 12 monthso 0 No Have you had any fall that resulted in injury in the last 12 monthso 0 No FALL RISK ASSESSMENT: History of falling - immediate or within 3 months 0 No Secondary diagnosis 0 No Ambulatory aid None/bed rest/wheelchair/nurse  0 Yes Crutches/cane/walker 0 No Furniture 0 No IV Access/Saline Lock 0 No Gait/Training Normal/bed rest/immobile 0 No Weak 10 Yes Impaired 0 No Mental Status Oriented to own ability 0 Yes Electronic Signature(s) Signed: 04/20/2017 5:06:00 PM By: [name] Sites Entered By: [name] Sites on 04/20/2017 08:21:25 [name] ([id number]) -------------------------------------------------------------------------------- Nutrition Risk Assessment Details Patient Name: [name], [name] Date of Service: 04/20/2017 8:00 AM Medical Record Number: [id number] Patient Account Number: [id number] Date of Birth/Sex: Nov 10, 1926 (83 y.o. Male) Treating RN: [name] Sites Primary Care Terrance Usery: Eleonore Chiquito Other Clinician: Referring [name]: Durenda Hurt, Rexford Maus Treating Elvenia Godden/Extender: [name] Caul Weeks in Treatment: 0 Height (in): Weight (lbs): Body Mass Index (BMI): Nutrition Risk Assessment Items NUTRITION RISK SCREEN: I have an illness or condition that made me change the kind and/or amount of 0 No food I eat I eat fewer than two meals per day 0 No I eat few fruits and  vegetables, or milk products 0 No I have three or more drinks of beer, liquor or wine almost every day 0 No I have tooth or mouth problems that make it hard for me to eat 0 No I don't always have enough money to buy the food I need 0 No I eat alone most of the time 0 No I take three or more different prescribed or over-the-counter drugs a day 1 Yes Without wanting to, I have lost or gained 10 pounds in the last six months 0 No I am not always physically able to shop, cook and/or feed myself 0 No Nutrition Protocols Good Risk Protocol 0 No interventions needed Moderate Risk Protocol Electronic Signature(s) Signed: 04/20/2017 5:06:00 PM By: [name] Sites Entered By: [name] Sites on 04/20/2017 08:21:31

## 2017-04-22 NOTE — Progress Notes (Signed)
Kishi, SANJIT MCMICHAEL ([id number]) Visit Report for 04/20/2017 Allergy List Details Patient Name: [name], [name] Date of Service: 04/20/2017 8:00 AM Medical Record Number: [id number] Patient Account Number: [id number] Date of Birth/Sex: 1926-12-14 (83 y.o. Male) Treating RN: Curtis Sites Primary Care Shamariah Shewmake: Eleonore Chiquito Other Clinician: Referring Efstathios Sawin: Durenda Hurt, Rexford Maus Treating Leila Schuff/Extender: Maxwell Caul Weeks in Treatment: 0 Allergies Active Allergies NSAIDS (Non-Steroidal Anti-Inflammatory Drug) Vicodin morphine Allergy Notes Electronic Signature(s) Signed: 04/20/2017 5:06:00 PM By: Curtis Sites Entered By: Curtis Sites on 04/20/2017 08:19:52 Haros, Edder E. ([id number]) -------------------------------------------------------------------------------- Arrival Information Details Patient Name: [name], [name] Date of Service: 04/20/2017 8:00 AM Medical Record Number: [id number] Patient Account Number: [id number] Date of Birth/Sex: Mar 07, 1927 (83 y.o. Male) Treating RN: Curtis Sites Primary Care Jarome Trull: Eleonore Chiquito Other Clinician: Referring Aury Scollard: Dierdre Highman Treating Carmelo Reidel/Extender: Altamese [place] in Treatment: 0 Visit Information Patient Arrived: Wheel Chair Arrival Time: 08:17 Accompanied By: self Transfer Assistance: Teddy Spike Patient Identification Verified: Yes Secondary Verification Process Completed: Yes Electronic Signature(s) Signed: 04/20/2017 5:06:00 PM By: Curtis Sites Entered By: Curtis Sites on 04/20/2017 08:18:25 Arras, Demico Bea Laura ([id number]) -------------------------------------------------------------------------------- Clinic Level of Care Assessment Details Patient Name: [name], [name] Date of Service: 04/20/2017 8:00 AM Medical Record Number: [id number] Patient Account Number: [id number] Date of Birth/Sex: 1926/07/19 (83 y.o. Male) Treating RN: Curtis Sites Primary Care  Varonica Siharath: Eleonore Chiquito Other Clinician: Referring Jadah Bobak: Durenda Hurt, Rexford Maus Treating Bentley Haralson/Extender: Altamese [place] in Treatment: 0 Clinic Level of Care Assessment Items TOOL 1 Quantity Score []  - Use when EandM and Procedure is performed on INITIAL visit 0 ASSESSMENTS - Nursing Assessment / Reassessment X - General Physical Exam (combine w/ comprehensive assessment (listed just below) when 1 20 performed on new pt. evals) X- 1 25 Comprehensive Assessment (HX, ROS, Risk Assessments, Wounds Hx, etc.) ASSESSMENTS - Wound and Skin Assessment / Reassessment []  - Dermatologic / Skin Assessment (not related to wound area) 0 ASSESSMENTS - Ostomy and/or Continence Assessment and Care []  - Incontinence Assessment and Management 0 []  - 0 Ostomy Care Assessment and Management (repouching, etc.) PROCESS - Coordination of Care X - Simple Patient / Family Education for ongoing care 1 15 []  - 0 Complex (extensive) Patient / Family Education for ongoing care []  - 0 Staff obtains Chiropractor, Records, Test Results / Process Orders []  - 0 Staff telephones HHA, Nursing Homes / Clarify orders / etc []  - 0 Routine Transfer to another Facility (non-emergent condition) []  - 0 Routine Hospital Admission (non-emergent condition) X- 1 15 New Admissions / Manufacturing engineer / Ordering NPWT, Apligraf, etc. []  - 0 Emergency Hospital Admission (emergent condition) PROCESS - Special Needs []  - Pediatric / Minor Patient Management 0 []  - 0 Isolation Patient Management []  - 0 Hearing / Language / Visual special needs []  - 0 Assessment of Community assistance (transportation, D/C planning, etc.) []  - 0 Additional assistance / Altered mentation []  - 0 Support Surface(s) Assessment (bed, cushion, seat, etc.) Hammack, Trevaris E. ([id number]) INTERVENTIONS - Miscellaneous []  - External ear exam 0 X- 1 10 Patient Transfer (multiple staff / Nurse, adult / Similar devices) []  -  0 Simple Staple / Suture removal (25 or less) []  - 0 Complex Staple / Suture removal (26 or more) []  - 0 Hypo/Hyperglycemic Management (do not check if billed separately) []  - 0 Ankle / Brachial Index (ABI) - do not check if billed separately Has the patient been seen at the hospital within the last three years: Yes Total Score: 85  Level Of Care: New/Established - Level 3 Electronic Signature(s) Signed: 04/20/2017 5:06:00 PM By: Curtis Sites Entered By: Curtis Sites on 04/20/2017 11:39:08 Kulinski, Karna Christmas ([id number]) -------------------------------------------------------------------------------- Encounter Discharge Information Details Patient Name: [name], [name] Date of Service: 04/20/2017 8:00 AM Medical Record Number: [id number] Patient Account Number: [id number] Date of Birth/Sex: [birth year] (83 y.o. Male) Treating RN: Curtis Sites Primary Care Lorilee Cafarella: Eleonore Chiquito Other Clinician: Referring Carlea Badour: Dierdre Highman Treating Nia Nathaniel/Extender: Altamese [place] in Treatment: 0 Encounter Discharge Information Items Discharge Pain Level: 0 Discharge Condition: Stable Ambulatory Status: Wheelchair Discharge Destination: Nursing Home Transportation: Private Auto Accompanied By: self Schedule Follow-up Appointment: No Medication Reconciliation completed and No provided to Patient/Care Jerelene Salaam: Provided on Clinical Summary of Care: 04/20/2017 Form Type Recipient Paper Patient [facility] Notes patient will follow up with [facility] at facility Electronic Signature(s) Signed: 04/20/2017 11:42:28 AM By: Curtis Sites Entered By: Curtis Sites on 04/20/2017 11:42:28 Voshell, Kashis E. ([id number]) -------------------------------------------------------------------------------- Multi Wound Chart Details Patient Name: [name], [name] Date of Service: 04/20/2017 8:00 AM Medical Record Number: [id number] Patient Account Number: [id number] Date of Birth/Sex:  [birth year] (83 y.o. Male) Treating RN: Curtis Sites Primary Care Angie Piercey: Eleonore Chiquito Other Clinician: Referring Ramy Greth: Durenda Hurt, Rexford Maus Treating Emmalynne Courtney/Extender: Maxwell Caul Weeks in Treatment: 0 Vital Signs Height(in): Pulse(bpm): 81 Weight(lbs): Blood Pressure(mmHg): 103/49 Body Mass Index(BMI): Temperature(F): 97.6 Respiratory Rate 18 (breaths/min): Photos: [1:No Photos] [N/A:N/A] Wound Location: [1:Sacrum] [N/A:N/A] Wounding Event: [1:Pressure Injury] [N/A:N/A] Primary Etiology: [1:Pressure Ulcer] [N/A:N/A] Comorbid History: [1:Chronic Obstructive Pulmonary Disease (COPD), Congestive Heart Failure, Hypertension, Osteoarthritis] [N/A:N/A] Date Acquired: [1:01/29/2017] [N/A:N/A] Weeks of Treatment: [1:0] [N/A:N/A] Wound Status: [1:Open] [N/A:N/A] Measurements L x W x D [1:5.5x3.4x3.5] [N/A:N/A] (cm) Area (cm) : [1:14.687] [N/A:N/A] Volume (cm) : [1:51.404] [N/A:N/A] % Reduction in Area: [1:0.00%] [N/A:N/A] % Reduction in Volume: [1:0.00%] [N/A:N/A] Classification: [1:Category/Stage IV] [N/A:N/A] Exudate Amount: [1:Large] [N/A:N/A] Exudate Type: [1:Purulent] [N/A:N/A] Exudate Color: [1:yellow, brown, green] [N/A:N/A] Wound Margin: [1:Flat and Intact] [N/A:N/A] Granulation Amount: [1:Medium (34-66%)] [N/A:N/A] Granulation Quality: [1:Red] [N/A:N/A] Necrotic Amount: [1:Medium (34-66%)] [N/A:N/A] Necrotic Tissue: [1:Eschar, Adherent Slough] [N/A:N/A] Exposed Structures: [1:Fat Layer (Subcutaneous Tissue) Exposed: Yes Muscle: Yes Bone: Yes Fascia: No Tendon: No Joint: No] [N/A:N/A] Epithelialization: [1:None] [N/A:N/A] Periwound Skin Texture: [1:Excoriation: No Induration: No Callus: No] [N/A:N/A] Crepitus: No Rash: No Scarring: No Periwound Skin Moisture: Maceration: Yes N/A N/A Dry/Scaly: No Periwound Skin Color: Atrophie Blanche: No N/A N/A Cyanosis: No Ecchymosis: No Erythema: No Hemosiderin Staining: No Mottled: No Pallor:  No Rubor: No Temperature: No Abnormality N/A N/A Tenderness on Palpation: Yes N/A N/A Wound Preparation: Ulcer Cleansing: N/A N/A Rinsed/Irrigated with Saline Topical Anesthetic Applied: Other: lidocaine 4% Treatment Notes Electronic Signature(s) Signed: 04/20/2017 5:29:04 PM By: Baltazar Najjar MD Entered By: Baltazar Najjar on 04/20/2017 09:32:42 Nakama, Karna Christmas ([id number]) -------------------------------------------------------------------------------- Pain Assessment Details Patient Name: Pennings, Dallen E. Date of Service: 04/20/2017 8:00 AM Medical Record Number: [id number] Patient Account Number: [id number] Date of Birth/Sex: Dec 25, 1926 (83 y.o. Male) Treating RN: Curtis Sites Primary Care Kenlyn Lose: Eleonore Chiquito Other Clinician: Referring Finesse Fielder: Dierdre Highman Treating Korvin Valentine/Extender: Maxwell Caul Weeks in Treatment: 0 Active Problems Location of Pain Severity and Description of Pain Patient Has Paino Yes Site Locations Pain Location: Pain in Ulcers With Dressing Change: Yes Duration of the Pain. Constant / Intermittento Intermittent Character of Pain Describe the Pain: Other: sore Pain Management and Medication Current Pain Management: Notes Topical or injectable lidocaine is offered to patient for acute pain when surgical debridement is performed. If needed, Patient  is instructed to use over the counter pain medication for the following 24-48 hours after debridement. Wound care MDs do not prescribed pain medications. Patient has chronic pain or uncontrolled pain. Patient has been instructed to make an appointment with their Primary Care Physician for pain management. Electronic Signature(s) Signed: 04/20/2017 5:06:00 PM By: Curtis Sitesorthy, Joanna Entered By: Curtis Sitesorthy, Joanna on 04/20/2017 08:18:59 Mabee, Karna ChristmasEVERETT E. ([id number]) -------------------------------------------------------------------------------- Patient/Caregiver Education  Details Patient Name: Voeltz, Estes E. Date of Service: 04/20/2017 8:00 AM Medical Record Number: [id number] Patient Account Number: [id number] Date of Birth/Gender: [birth year] [age] y.o. Male) Treating RN: Curtis Sitesorthy, Joanna Primary Care Physician: Eleonore ChiquitoKWIATKOWSKI, PETER Other Clinician: Referring Physician: Dierdre HighmanARNAEZ ZAPATA, GERARDO Treating Physician/Extender: Altamese CarolinaOBSON, MICHAEL G Weeks in Treatment: 0 Education Assessment Education Provided To: Caregiver SNF staff Education Topics Provided Wound/Skin Impairment: Handouts: Other: biopsy and culture taken, f/u with Leonard SchwartzHoyt Methods: Clinical cytogeneticistrinted Electronic Signature(s) Signed: 04/20/2017 5:06:00 PM By: Curtis Sitesorthy, Joanna Entered By: Curtis Sitesorthy, Joanna on 04/20/2017 16:41:32 Shorb, Swanson EMarland Kitchen. ([id number]) -------------------------------------------------------------------------------- Wound Assessment Details Patient Name: Piper, Treyvin E. Date of Service: 04/20/2017 8:00 AM Medical Record Number: [id number] Patient Account Number: [id number] Date of Birth/Sex: [birth year] [age] y.o. Male) Treating RN: Curtis Sitesorthy, Joanna Primary Care Jeni Duling: Eleonore ChiquitoKWIATKOWSKI, PETER Other Clinician: Referring Herlinda Heady: Durenda HurtARNAEZ ZAPATA, Rexford MausGERARDO Treating Jackie Russman/Extender: Maxwell CaulOBSON, MICHAEL G Weeks in Treatment: 0 Wound Status Wound Number: 1 Primary Pressure Ulcer Etiology: Wound Location: Sacrum Wound Open Wounding Event: Pressure Injury Status: Date Acquired: 01/29/2017 Comorbid Chronic Obstructive Pulmonary Disease Weeks Of Treatment: 0 History: (COPD), Congestive Heart Failure, Clustered Wound: No Hypertension, Osteoarthritis Photos Photo Uploaded By: Curtis Sitesorthy, Joanna on 04/20/2017 16:22:43 Wound Measurements Length: (cm) 5.5 Width: (cm) 3.4 Depth: (cm) 3.5 Area: (cm) 14.687 Volume: (cm) 51.404 % Reduction in Area: 0% % Reduction in Volume: 0% Epithelialization: None Tunneling: No Undermining: No Wound Description Classification: Category/Stage IV Wound Margin: Flat and  Intact Exudate Amount: Large Exudate Type: Purulent Exudate Color: yellow, brown, green Foul Odor After Cleansing: No Slough/Fibrino Yes Wound Bed Granulation Amount: Medium (34-66%) Exposed Structure Granulation Quality: Red Fascia Exposed: No Necrotic Amount: Medium (34-66%) Fat Layer (Subcutaneous Tissue) Exposed: Yes Necrotic Quality: Eschar, Adherent Slough Tendon Exposed: No Muscle Exposed: Yes Necrosis of Muscle: No Joint Exposed: No Bone Exposed: Yes Coller, Kordae E. ([id number]) Periwound Skin Texture Texture Color No Abnormalities Noted: No No Abnormalities Noted: No Callus: No Atrophie Blanche: No Crepitus: No Cyanosis: No Excoriation: No Ecchymosis: No Induration: No Erythema: No Rash: No Hemosiderin Staining: No Scarring: No Mottled: No Pallor: No Moisture Rubor: No No Abnormalities Noted: No Dry / Scaly: No Temperature / Pain Maceration: Yes Temperature: No Abnormality Tenderness on Palpation: Yes Wound Preparation Ulcer Cleansing: Rinsed/Irrigated with Saline Topical Anesthetic Applied: Other: lidocaine 4%, Electronic Signature(s) Signed: 04/20/2017 5:06:00 PM By: Curtis Sitesorthy, Joanna Entered By: Curtis Sitesorthy, Joanna on 04/20/2017 08:46:25 Bianca, Lamondre Bea LauraE. ([id number]) -------------------------------------------------------------------------------- Vitals Details Patient Name: Kasson, Elison E. Date of Service: 04/20/2017 8:00 AM Medical Record Number: [id number] Patient Account Number: [id number] Date of Birth/Sex: [birth year](83 y.o. Male) Treating RN: Curtis Sitesorthy, Joanna Primary Care Liyah Higham: Eleonore ChiquitoKWIATKOWSKI, PETER Other Clinician: Referring Brixton Franko: Durenda HurtARNAEZ ZAPATA, Rexford MausGERARDO Treating Benjerman Molinelli/Extender: Maxwell CaulOBSON, MICHAEL G Weeks in Treatment: 0 Vital Signs Time Taken: 08:37 Temperature (F): 97.6 Pulse (bpm): 81 Respiratory Rate (breaths/min): 18 Blood Pressure (mmHg): 103/49 Reference Range: 80 - 120 mg / dl Electronic Signature(s) Signed: 04/20/2017 5:06:00  PM By: Curtis Sitesorthy, Joanna Entered By: Curtis Sitesorthy, Joanna on 04/20/2017 08:38:16

## 2017-04-22 NOTE — Progress Notes (Signed)
[name], [name] ([id number]) Visit Report for 04/20/2017 Chief Complaint Document Details Patient Name: [name] Date of Service: 04/20/2017 8:00 AM Medical Record Number: [id number] Patient Account Number: [id number] Date of Birth/Sex: 03/30/1927 (83 y.o. Male) Treating RN: Curtis Sites Primary Care Provider: Eleonore Chiquito Other Clinician: Referring Provider: Dierdre Highman Treating Provider/Extender: Maxwell Caul Weeks in Treatment: 0 Information Obtained from: Patient Chief Complaint 04/20/17; this is a patient who is actually followed at [facility] skilled facility by our service. Referred to clinic today for consideration of a bone biopsy and culture Electronic Signature(s) Signed: 04/20/2017 5:29:04 PM By: Baltazar Najjar MD Entered By: Baltazar Najjar on 04/20/2017 10:17:15 [name], [name] ([id number]) -------------------------------------------------------------------------------- Debridement Details Patient Name: [name], [name] Date of Service: 04/20/2017 8:00 AM Medical Record Number: [id number] Patient Account Number: [id number] Date of Birth/Sex: 02/27/1927 (83 y.o. Male) Treating RN: Huel Coventry Primary Care Provider: Eleonore Chiquito Other Clinician: Referring Provider: Dierdre Highman Treating Provider/Extender: Altamese [place] in Treatment: 0 Debridement Performed for Wound #1 Sacrum Assessment: Performed By: Physician Maxwell Caul, MD Debridement: Debridement Pre-procedure Verification/Time Yes - 09:06 Out Taken: Start Time: 09:06 Pain Control: Other : lidocaine 4% Level: Skin/Subcutaneous Tissue/Muscle/Bone Total Area Debrided (L x W): 0.3 (cm) x 0.2 (cm) = 0.06 (cm) Tissue and other material Non-Viable, Bone debrided: Instrument: Forceps, Nippers Specimen: Biopsy, Swab Number of Specimens Taken: 2 Bleeding: Moderate Hemostasis Achieved: Pressure End Time: 09:10 Procedural Pain: 7 Post Procedural  Pain: 3 Response to Treatment: Procedure was tolerated well Post Debridement Measurements of Total Wound Length: (cm) 5.5 Stage: Category/Stage IV Width: (cm) 3.4 Depth: (cm) 3.5 Volume: (cm) 51.404 Character of Wound/Ulcer Post Stable Debridement: Post Procedure Diagnosis Same as Pre-procedure Electronic Signature(s) Signed: 04/20/2017 10:34:00 AM By: Elliot Gurney, BSN, RN, CWS, Kim RN, BSN Signed: 04/20/2017 5:29:04 PM By: Baltazar Najjar MD Entered By: Elliot Gurney, BSN, RN, CWS, Kim on 04/20/2017 10:34:00 [name], [name] ([id number]) -------------------------------------------------------------------------------- HPI Details Patient Name: [name], [name] Date of Service: 04/20/2017 8:00 AM Medical Record Number: [id number] Patient Account Number: [id number] Date of Birth/Sex: 07/16/1926 (83 y.o. Male) Treating RN: Curtis Sites Primary Care Provider: Eleonore Chiquito Other Clinician: Referring Provider: Dierdre Highman Treating Provider/Extender: Maxwell Caul Weeks in Treatment: 0 History of Present Illness HPI Description: 04/20/17; this is an 83 year old man who is a bit of an unfortunate story. He was initially admitted to hospital from 9/10 through 9/14 for an elective left total knee replacement secondary to osteoarthritis. On 9/17 a nurse in the hospital noted non-blanchable erythema. The patient was discharged to [facility] skilled facility. An admission note by Dr. Lyn Hollingshead on 01/31/17 noted him unstageable sacral decubitus ulcer. The patient was then admitted to hospital from 9/24 through 10/8 now with a sacral wound felt to be infected. The patient had fever and chills. Required in his surgical IandD. Blood cultures grew Clostridium Ramosium and Bacteroides fragilis. I am not able to see the Bacteroides in care everywhere today. Nevertheless the patient was seen by infectious disease and recommended for 6 weeks of ceftriaxone and Flagyl which should be completed.  An x-ray in the hospital on 9/24 showed peroneal air which may be secondary to a decubitus ulcer no evidence for acute osseous or bone destructive change. An x-ray in the facility on 11/29 showed findings consistent with acute osteomyelitis of the distal coccyx.. Given the history here I suspect that this is probably not all acute and could have resulted at the time of infection the patient has  a history of chronic atrial fibrillation on Xarelto, congestive heart failure with an EF of 40%, hypertension, BPH and osteoarthritis. He has been referred here for consideration of biopsy and culture of exposed coccyx It is difficult to follow the course of this patient's wound. Certainly he could have had underlying osteo in the hospital that wasn't well identified by plane xray and could have had more resistant organisms that were identified. I think we should wait for the results of the bone bx and cx today before initiating any antibiotics. Electronic Signature(s) Signed: 04/20/2017 5:29:04 PM By: Baltazar Najjar MD Entered By: Baltazar Najjar on 04/20/2017 14:39:57 [name], [name] ([id number]) -------------------------------------------------------------------------------- Physical Exam Details Patient Name: [name], [name] Date of Service: 04/20/2017 8:00 AM Medical Record Number: [id number] Patient Account Number: [id number] Date of Birth/Sex: 19-Sep-1926 (83 y.o. Male) Treating RN: Curtis Sites Primary Care Provider: Eleonore Chiquito Other Clinician: Referring Provider: Durenda Hurt, Rexford Maus Treating Provider/Extender: Maxwell Caul Weeks in Treatment: 0 Constitutional Sitting or standing Blood Pressure is within target range for patient.. Pulse regular and within target range for patient.[name] Respirations regular, non-labored and within target range.. Temperature is normal and within the target range for the patient.[name] appears in no distress. 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 There is exposed bone of what I think is coccyx but also probably on the pelvic bones it themselves. Using hemostats and pickups I was able to obtain specimens of the coccyx for pathology and CandS. The patient tolerated this marginally. Hemostasis with direct pressure Electronic Signature(s) Signed: 04/20/2017 5:29:04 PM By: Baltazar Najjar MD Entered By: Baltazar Najjar on 04/20/2017 10:23:37 [name], [name] ([id number]) -------------------------------------------------------------------------------- Physician Orders Details Patient Name: [name], [name] Date of Service: 04/20/2017 8:00 AM Medical Record Number: [id number] Patient Account Number: [id number] Date of Birth/Sex: [birth year] (83 y.o. Male) Treating RN: Curtis Sites Primary Care Provider: Eleonore Chiquito Other Clinician: Referring Provider: Durenda Hurt, Rexford Maus Treating Provider/Extender: Altamese [place] in Treatment: 0 Verbal / Phone Orders: No Diagnosis Coding Wound Cleansing Wound #1 Sacrum o Clean wound with Normal Saline. Anesthetic Wound #1 Sacrum o Topical Lidocaine 4% cream applied to wound bed prior to debridement Primary Wound Dressing Wound #1 Sacrum o Silvercel Non-Adherent - at [facility] Secondary Dressing Wound #1 Sacrum o Dry Gauze - at [facility] o Boardered Foam Dressing - at [facility] Follow-up Appointments Wound #1 Sacrum o Other: - follow up with Allen Derry III PAC at [facility] o Tissue Pathology biopsy report (PATH) oooo LOINC Code: [phone] oooo Convenience Name: Tiss Path Bx report o Bacteria identified in Wound by Culture (MICRO) oooo LOINC Code: [phone] oooo Convenience Name: Wound culture routine Electronic Signature(s) Signed: 04/20/2017 5:06:00 PM By: Curtis Sites Signed: 04/20/2017 5:29:04 PM By: Baltazar Najjar MD Entered By: Curtis Sites on 04/20/2017 09:10:44 [name], [name] ([id number]) -------------------------------------------------------------------------------- Problem List Details Patient Name: [name], [name] Date of Service: 04/20/2017 8:00 AM Medical Record Number: [id number] Patient Account Number: [id number] Date of Birth/Sex: August 27, 1926 (83 y.o. Male) Treating RN: Curtis Sites Primary Care Provider: Eleonore Chiquito Other Clinician: Referring Provider: Durenda Hurt, Rexford Maus Treating Provider/Extender: Maxwell Caul Weeks in Treatment: 0 Active Problems ICD-10 Encounter Code Description Active Date Diagnosis L89.154 Pressure ulcer of sacral region, stage 4 04/20/2017 Yes M86.68 Other chronic osteomyelitis, other site 04/20/2017 Yes Inactive Problems Resolved Problems Electronic Signature(s) Signed: 04/20/2017 5:29:04 PM  By: Baltazar Najjarobson, Jerni Selmer MD Entered By: Baltazar Najjarobson, Anayely Constantine on 04/20/2017 09:32:29 Shatz, [name] ChristmasEVERETT [name] ([id number]) -------------------------------------------------------------------------------- Progress Note Details Patient Name: [name], [name] Date of Service: 04/20/2017 8:00 AM Medical Record Number: [id number] Patient Account Number: [id number] Date of Birth/Sex: [birth year](83 y.o. Male) Treating RN: Curtis Sitesorthy, Joanna Primary Care Provider: Eleonore ChiquitoKWIATKOWSKI, PETER Other Clinician: Referring Provider: Dierdre HighmanARNAEZ ZAPATA, GERARDO Treating Provider/Extender: Altamese CarolinaOBSON, Angell Pincock G Weeks in Treatment: 0 Subjective Chief Complaint Information obtained from Patient 04/20/17; this is a patient who is actually followed at [facility] skilled facility by our service. Referred to clinic today for consideration of a bone biopsy and culture History of Present Illness (HPI) 04/20/17; this is an 83 year old man who is a bit of an unfortunate story. He was initially admitted to hospital from 9/10 through 9/14 for an elective left total knee replacement secondary to  osteoarthritis. On 9/17 a nurse in the hospital noted non- blanchable erythema. The patient was discharged to [facility] skilled facility. An admission note by Dr. Lyn HollingsheadAlexander on 01/31/17 noted him unstageable sacral decubitus ulcer. The patient was then admitted to hospital from 9/24 through 10/8 now with a sacral wound felt to be infected. The patient had fever and chills. Required in his surgical IandD. Blood cultures grew Clostridium Ramosium and Bacteroides fragilis. I am not able to see the Bacteroides in care everywhere today. Nevertheless the patient was seen by infectious disease and recommended for 6 weeks of ceftriaxone and Flagyl which should be completed. An x-ray in the hospital on 9/24 showed peroneal air which may be secondary to a decubitus ulcer no evidence for acute osseous or bone destructive change. An x-ray in the facility on 11/29 showed findings consistent with acute osteomyelitis of the distal coccyx.. Given the history here I suspect that this is probably not all acute and could have resulted at the time of infection the patient has a history of chronic atrial fibrillation on Xarelto, congestive heart failure with an EF of 40%, hypertension, BPH and osteoarthritis. He has been referred here for consideration of biopsy and culture of exposed coccyx It is difficult to follow the course of this patient's wound. Certainly he could have had underlying osteo in the hospital that wasn't well identified by plane xray and could have had more resistant organisms that were identified. I think we should wait for the results of the bone bx and cx today before initiating any antibiotics. Wound History Patient presents with 1 open wound that has been present for approximately 3 months. Patient has been treating wound in the following manner: unknown. Laboratory tests have been performed in the last month. Patient reportedly has not tested positive for an antibiotic resistant organism.  Patient reportedly has tested positive for osteomyelitis. Patient reportedly has not had testing performed to evaluate circulation in the legs. Patient History Information obtained from Patient. Allergies NSAIDS (Non-Steroidal Anti-Inflammatory Drug), Vicodin, morphine Family History Heart Disease - Mother,Father, Hypertension - Mother,Father, No family history of Cancer, Diabetes, Hereditary Spherocytosis, Kidney Disease, Lung Disease, Seizures, Stroke, Thyroid Problems, Tuberculosis. Social History Former smoker - quit in 1996, Marital Status - Widowed, Alcohol Use - Never, Drug Use - No History, Caffeine Use - Daily. [name], [name] ([id number]) Medical History Eyes Denies history of Cataracts, Glaucoma, Optic Neuritis Hematologic/Lymphatic Denies history of Anemia, Hemophilia, Human Immunodeficiency Virus, Lymphedema, Sickle Cell Disease Respiratory Patient has history of Chronic Obstructive Pulmonary Disease (COPD) Cardiovascular Patient has history of Congestive Heart Failure, Hypertension Musculoskeletal Patient has history of Osteoarthritis Medical And Surgical History Notes Eyes  macular degeneration Respiratory continuous O2 Review of Systems (ROS) Constitutional Symptoms (General Health) The patient has no complaints or symptoms. Eyes The patient has no complaints or symptoms. Ear/Nose/Mouth/Throat The patient has no complaints or symptoms. Hematologic/Lymphatic The patient has no complaints or symptoms. Respiratory The patient has no complaints or symptoms. Gastrointestinal The patient has no complaints or symptoms. Endocrine The patient has no complaints or symptoms. Genitourinary The patient has no complaints or symptoms. Immunological The patient has no complaints or symptoms. Integumentary (Skin) The patient has no complaints or symptoms. Neurologic The patient has no complaints or symptoms. Oncologic The patient has no complaints or  symptoms. Objective Constitutional Sitting or standing Blood Pressure is within target range for patient.. Pulse regular and within target range for patient.[name] Respirations regular, non-labored and within target range.. Temperature is normal and within the target range for the patient.[name] appears in no distress. [name], [name] ([id number]) Vitals Time Taken: 8:37 AM, Temperature: 97.6 F, Pulse: 81 bpm, Respiratory Rate: 18 breaths/min, Blood Pressure: 103/49 mmHg. 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 There is exposed bone of what I think is coccyx but also probably on the pelvic bones it themselves. Using hemostats and pickups I was able to obtain specimens of the coccyx for pathology and CandS. The patient tolerated this marginally. Hemostasis with direct pressure Integumentary (Hair, Skin) Wound #1 status is Open. Original cause of wound was Pressure Injury. The wound is located on the Sacrum. The wound measures 5.5cm length x 3.4cm width x 3.5cm depth; 14.687cm^2 area and 51.404cm^3 volume. There is bone, muscle, and Fat Layer (Subcutaneous Tissue) Exposed exposed. There is no tunneling or undermining noted. There is a large amount of purulent drainage noted. The wound margin is flat and intact. There is medium (34-66%) red granulation within the wound bed. There is a medium (34-66%) amount of necrotic tissue within the wound bed including Eschar and Adherent Slough. The periwound skin appearance exhibited: Maceration. The periwound skin appearance did not exhibit: Callus, Crepitus, Excoriation, Induration, Rash, Scarring, Dry/Scaly, Atrophie Blanche, Cyanosis, Ecchymosis, Hemosiderin Staining, Mottled, Pallor, Rubor, Erythema. Periwound temperature was noted as No Abnormality. The periwound has tenderness on palpation. Assessment Active Problems ICD-10 L89.154 - Pressure ulcer of sacral  region, stage 4 M86.68 - Other chronic osteomyelitis, other site Procedures Wound #1 Pre-procedure diagnosis of Wound #1 is a Pressure Ulcer located on the Sacrum . There was a Skin/Subcutaneous Tissue/Muscle/Bone Debridement ([id number]) debridement with total area of 0.06 sq cm performed by Maxwell Caul, MD. with the following instrument(s): Forceps and Nippers to remove Non-Viable tissue/material including Bone after achieving pain control using Other (lidocaine 4%). 2 specimens were taken by a Swab and Biopsy and sent to the lab per facility protocol.A time out was conducted at 09:06, prior to the start of the procedure. A Moderate amount of bleeding was controlled with Pressure. The procedure was tolerated well with a pain level of 7 throughout and a pain level of 3 following the procedure. Post Debridement Measurements: 5.5cm length x 3.4cm width x 3.5cm depth; 51.404cm^3 volume. Post debridement Stage noted as Category/Stage IV. Character of Wound/Ulcer Post Debridement is stable. Post procedure Diagnosis Wound #1: Same as Pre-Procedure Plan Voorhies, Trendon [name] ([id number]) Wound Cleansing: Wound #1 Sacrum: Clean wound with Normal Saline. Anesthetic: Wound #1 Sacrum: Topical Lidocaine 4% cream applied to wound bed prior to debridement Primary Wound Dressing: Wound #1 Sacrum: Silvercel Non-Adherent -  at [facility] Secondary Dressing: Wound #1 Sacrum: Dry Gauze - at [facility] Boardered Foam Dressing - at [facility] Follow-up Appointments: Wound #1 Sacrum: Other: - follow up with Allen Derry III PAC at [facility] ordered were: Tiss Path Bx report, Wound culture routine #1 this is a patient who is being followed by our service in a skilled facility sent here specifically for culture and bone biopsy for pathology. #2 this was done as described #3 this is a large wound with exposed bone. A recent x-ray suggests  osteomyelitis which is a change from one done in September in the hospital. I did not see previous MRIs or CT scans of this area. #4 for now there is no reason for this patient to be followed here. We will communicate with our service in the facility about the results as noted 5 No additional antibiotics until bone results return Electronic Signature(s) Signed: 04/20/2017 5:29:04 PM By: Baltazar Najjar MD Previous Signature: 04/20/2017 10:28:12 AM Version By: Baltazar Najjar MD Entered By: Baltazar Najjar on 04/20/2017 14:41:28 Williams, [name] ([id number]) -------------------------------------------------------------------------------- ROS/PFSH Details Patient Name: Beckett, Markos [name] Date of Service: 04/20/2017 8:00 AM Medical Record Number: [id number] Patient Account Number: [id number] Date of Birth/Sex: [birth year] (83 y.o. Male) Treating RN: Curtis Sites Primary Care Provider: Eleonore Chiquito Other Clinician: Referring Provider: Dierdre Highman Treating Provider/Extender: Maxwell Caul Weeks in Treatment: 0 Information Obtained From Patient Wound History Do you currently have one or more open woundso Yes How many open wounds do you currently haveo 1 Approximately how long have you had your woundso 3 months How have you been treating your wound(s) until nowo unknown Has your wound(s) ever healed and then re-openedo No Have you had any lab work done in the past montho Yes Who ordered the lab work doneo [facility] Have you tested positive for an antibiotic resistant organism (MRSA, VRE)o No Have you tested positive for osteomyelitis (bone infection)o Yes Date: 04/20/2017 Have you had any tests for circulation on your legso No Constitutional Symptoms (General Health) Complaints and Symptoms: No Complaints or Symptoms Eyes Complaints and Symptoms: No Complaints or Symptoms Medical History: Negative for: Cataracts; Glaucoma; Optic Neuritis Past Medical History  Notes: macular degeneration Ear/Nose/Mouth/Throat Complaints and Symptoms: No Complaints or Symptoms Hematologic/Lymphatic Complaints and Symptoms: No Complaints or Symptoms Medical History: Negative for: Anemia; Hemophilia; Human Immunodeficiency Virus; Lymphedema; Sickle Cell Disease Respiratory Complaints and Symptoms: No Complaints or Symptoms Medical History: Spoonemore, Daemyn [name] ([id number]) Positive for: Chronic Obstructive Pulmonary Disease (COPD) Past Medical History Notes: continuous O2 Cardiovascular Medical History: Positive for: Congestive Heart Failure; Hypertension Gastrointestinal Complaints and Symptoms: No Complaints or Symptoms Endocrine Complaints and Symptoms: No Complaints or Symptoms Genitourinary Complaints and Symptoms: No Complaints or Symptoms Immunological Complaints and Symptoms: No Complaints or Symptoms Integumentary (Skin) Complaints and Symptoms: No Complaints or Symptoms Musculoskeletal Medical History: Positive for: Osteoarthritis Neurologic Complaints and Symptoms: No Complaints or Symptoms Oncologic Complaints and Symptoms: No Complaints or Symptoms Immunizations Pneumococcal Vaccine: Received Pneumococcal Vaccination: Yes Immunization Notes: up to date Implantable Devices Family and Social History Kitner, TREVONN HALLUM. ([id number]) Cancer: No; Diabetes: No; Heart Disease: Yes - Mother,Father; Hereditary Spherocytosis: No; Hypertension: Yes - Mother,Father; Kidney Disease: No; Lung Disease: No; Seizures: No; Stroke: No; Thyroid Problems: No; Tuberculosis: No; Former smoker - quit in 1996; Marital Status - Widowed; Alcohol Use: Never; Drug Use: No History; Caffeine Use: Daily; Financial Concerns: No; Food, Clothing or Shelter Needs: No; Support System Lacking: No;  Transportation Concerns: No; Advanced Directives: No; Patient does not want information on Advanced Directives Electronic Signature(s) Signed: 04/20/2017 5:06:00 PM By:  Curtis Sites Signed: 04/20/2017 5:29:04 PM By: Baltazar Najjar MD Entered By: Curtis Sites on 04/20/2017 08:30:32 Bellanca, [name] ([id number]) -------------------------------------------------------------------------------- SuperBill Details Patient Name: Kozloski, Kary [name] Date of Service: 04/20/2017 Medical Record Number: [id number] Patient Account Number: [id number] Date of Birth/Sex: Aug 20, 1926 (83 y.o. Male) Treating RN: Curtis Sites Primary Care Provider: Eleonore Chiquito Other Clinician: Referring Provider: Durenda Hurt, Rexford Maus Treating Provider/Extender: Maxwell Caul Weeks in Treatment: 0 Diagnosis Coding ICD-10 Codes Code Description L89.154 Pressure ulcer of sacral region, stage 4 M86.68 Other chronic osteomyelitis, other site Facility Procedures CPT4 Code: [id number] Description: 99213 - WOUND CARE VISIT-LEV 3 EST PT Modifier: Quantity: 1 CPT4 Code: [id number] Description: 11044 - DEB BONE 20 SQ CM/< ICD-10 Diagnosis Description L89.154 Pressure ulcer of sacral region, stage 4 M86.68 Other chronic osteomyelitis, other site Modifier: Quantity: 1 Physician Procedures CPT4: Description Modifier Quantity Code [id number] Debridement; bone (includes epidermis, dermis, subQ tissue, muscle and/or fascia, if 1 performed) 1st 20 sqcm or less ICD-10 Diagnosis Description L89.154 Pressure ulcer of sacral region, stage 4 M86.68  Other chronic osteomyelitis, other site Electronic Signature(s) Signed: 04/20/2017 11:39:25 AM By: Curtis Sites Signed: 04/20/2017 5:29:04 PM By: Baltazar Najjar MD Entered By: Curtis Sites on 04/20/2017 11:39:24

## 2017-04-26 ENCOUNTER — Other Ambulatory Visit: Payer: Self-pay | Admitting: Pharmacist

## 2017-04-26 ENCOUNTER — Telehealth: Payer: Self-pay | Admitting: Internal Medicine

## 2017-04-26 ENCOUNTER — Telehealth: Payer: Self-pay | Admitting: *Deleted

## 2017-04-26 NOTE — Telephone Encounter (Signed)
Mr. Hipps coccygeal bone biopsy confirmed osteomyelitis.  Bone cultures are growing diphtheroids, group B streptococcus, E. coli and MRSA.  We will call and give orders to [facility] skilled nursing facility to start him on vancomycin IV per protocol and ertapenem 1 g IV daily for 6 weeks through 06/07/2017.  We will also give orders to obtain a CBC, BMP, ESR, CRP and vancomycin trough weekly while on IV antibiotic therapy.  He will follow-up with me on 06/07/2017.

## 2017-04-26 NOTE — Telephone Encounter (Signed)
Per verbal order from Dr Megan Salon, RN called Erline Levine at [facility] with orders for IV Vancomycin (pharmacy dosing) and IV Ertapenam (1 gm daily) through 06/07/17 with weekly labs (cbc, bmp, esr, crp, vancomycin trough) faxed to [phone]. Orders repeated and verified.  RN faxed written order to [place] at [phone]. Landis Gandy, RN

## 2017-04-27 LAB — AEROBIC CULTURE  (SUPERFICIAL SPECIMEN): GRAM STAIN: NONE SEEN

## 2017-04-27 LAB — AEROBIC CULTURE W GRAM STAIN (SUPERFICIAL SPECIMEN)

## 2017-05-02 ENCOUNTER — Other Ambulatory Visit: Payer: Self-pay | Admitting: Gastroenterology

## 2017-05-02 DIAGNOSIS — R195 Other fecal abnormalities: Secondary | ICD-10-CM

## 2017-05-09 ENCOUNTER — Ambulatory Visit
Admission: RE | Admit: 2017-05-09 | Discharge: 2017-05-09 | Disposition: A | Discharge status: Home / Self Care | Payer: Medicare Other | Source: Ambulatory Visit | Attending: Gastroenterology | Admitting: Gastroenterology

## 2017-05-09 DIAGNOSIS — R195 Other fecal abnormalities: Secondary | ICD-10-CM

## 2017-05-14 ENCOUNTER — Emergency Department (HOSPITAL_COMMUNITY): Payer: Medicare Other

## 2017-05-14 ENCOUNTER — Other Ambulatory Visit: Payer: Self-pay

## 2017-05-14 ENCOUNTER — Encounter (HOSPITAL_COMMUNITY): Payer: Self-pay

## 2017-05-14 ENCOUNTER — Inpatient Hospital Stay (HOSPITAL_COMMUNITY)
Admission: EM | Admit: 2017-05-14 | Discharge: 2017-05-28 | DRG: 091 | Disposition: A | Discharge status: Skilled Nursing Facility | Payer: Medicare Other | Attending: Family Medicine | Admitting: Family Medicine

## 2017-05-14 DIAGNOSIS — R0902 Hypoxemia: Secondary | ICD-10-CM

## 2017-05-14 DIAGNOSIS — G4700 Insomnia, unspecified: Secondary | ICD-10-CM | POA: Diagnosis present

## 2017-05-14 DIAGNOSIS — G92 Toxic encephalopathy: Secondary | ICD-10-CM | POA: Diagnosis not present

## 2017-05-14 DIAGNOSIS — G9340 Encephalopathy, unspecified: Secondary | ICD-10-CM | POA: Diagnosis present

## 2017-05-14 DIAGNOSIS — I350 Nonrheumatic aortic (valve) stenosis: Secondary | ICD-10-CM | POA: Diagnosis present

## 2017-05-14 DIAGNOSIS — Z79891 Long term (current) use of opiate analgesic: Secondary | ICD-10-CM

## 2017-05-14 DIAGNOSIS — I959 Hypotension, unspecified: Secondary | ICD-10-CM | POA: Diagnosis not present

## 2017-05-14 DIAGNOSIS — R109 Unspecified abdominal pain: Secondary | ICD-10-CM

## 2017-05-14 DIAGNOSIS — D649 Anemia, unspecified: Secondary | ICD-10-CM | POA: Diagnosis present

## 2017-05-14 DIAGNOSIS — B9620 Unspecified Escherichia coli [E. coli] as the cause of diseases classified elsewhere: Secondary | ICD-10-CM | POA: Diagnosis present

## 2017-05-14 DIAGNOSIS — J811 Chronic pulmonary edema: Secondary | ICD-10-CM

## 2017-05-14 DIAGNOSIS — K219 Gastro-esophageal reflux disease without esophagitis: Secondary | ICD-10-CM | POA: Diagnosis present

## 2017-05-14 DIAGNOSIS — M4628 Osteomyelitis of vertebra, sacral and sacrococcygeal region: Secondary | ICD-10-CM | POA: Diagnosis present

## 2017-05-14 DIAGNOSIS — N401 Enlarged prostate with lower urinary tract symptoms: Secondary | ICD-10-CM | POA: Diagnosis present

## 2017-05-14 DIAGNOSIS — R4182 Altered mental status, unspecified: Secondary | ICD-10-CM

## 2017-05-14 DIAGNOSIS — I482 Chronic atrial fibrillation: Secondary | ICD-10-CM | POA: Diagnosis not present

## 2017-05-14 DIAGNOSIS — J9611 Chronic respiratory failure with hypoxia: Secondary | ICD-10-CM

## 2017-05-14 DIAGNOSIS — Z7901 Long term (current) use of anticoagulants: Secondary | ICD-10-CM

## 2017-05-14 DIAGNOSIS — Z66 Do not resuscitate: Secondary | ICD-10-CM | POA: Diagnosis present

## 2017-05-14 DIAGNOSIS — E669 Obesity, unspecified: Secondary | ICD-10-CM | POA: Diagnosis present

## 2017-05-14 DIAGNOSIS — I4821 Permanent atrial fibrillation: Secondary | ICD-10-CM

## 2017-05-14 DIAGNOSIS — Z79899 Other long term (current) drug therapy: Secondary | ICD-10-CM

## 2017-05-14 DIAGNOSIS — Z8249 Family history of ischemic heart disease and other diseases of the circulatory system: Secondary | ICD-10-CM

## 2017-05-14 DIAGNOSIS — I429 Cardiomyopathy, unspecified: Secondary | ICD-10-CM | POA: Diagnosis present

## 2017-05-14 DIAGNOSIS — N179 Acute kidney failure, unspecified: Secondary | ICD-10-CM | POA: Diagnosis present

## 2017-05-14 DIAGNOSIS — L89154 Pressure ulcer of sacral region, stage 4: Secondary | ICD-10-CM | POA: Diagnosis present

## 2017-05-14 DIAGNOSIS — I481 Persistent atrial fibrillation: Secondary | ICD-10-CM | POA: Diagnosis present

## 2017-05-14 DIAGNOSIS — I480 Paroxysmal atrial fibrillation: Secondary | ICD-10-CM | POA: Diagnosis present

## 2017-05-14 DIAGNOSIS — H3530 Unspecified macular degeneration: Secondary | ICD-10-CM | POA: Diagnosis present

## 2017-05-14 DIAGNOSIS — E876 Hypokalemia: Secondary | ICD-10-CM | POA: Diagnosis present

## 2017-05-14 DIAGNOSIS — G4733 Obstructive sleep apnea (adult) (pediatric): Secondary | ICD-10-CM | POA: Diagnosis present

## 2017-05-14 DIAGNOSIS — R0600 Dyspnea, unspecified: Secondary | ICD-10-CM

## 2017-05-14 DIAGNOSIS — J310 Chronic rhinitis: Secondary | ICD-10-CM | POA: Diagnosis present

## 2017-05-14 DIAGNOSIS — M869 Osteomyelitis, unspecified: Secondary | ICD-10-CM | POA: Diagnosis not present

## 2017-05-14 DIAGNOSIS — Z6835 Body mass index (BMI) 35.0-35.9, adult: Secondary | ICD-10-CM

## 2017-05-14 DIAGNOSIS — B951 Streptococcus, group B, as the cause of diseases classified elsewhere: Secondary | ICD-10-CM | POA: Diagnosis present

## 2017-05-14 DIAGNOSIS — Z885 Allergy status to narcotic agent status: Secondary | ICD-10-CM

## 2017-05-14 DIAGNOSIS — Z886 Allergy status to analgesic agent status: Secondary | ICD-10-CM

## 2017-05-14 DIAGNOSIS — Z7983 Long term (current) use of bisphosphonates: Secondary | ICD-10-CM

## 2017-05-14 DIAGNOSIS — K5939 Other megacolon: Secondary | ICD-10-CM | POA: Diagnosis not present

## 2017-05-14 DIAGNOSIS — E785 Hyperlipidemia, unspecified: Secondary | ICD-10-CM | POA: Diagnosis present

## 2017-05-14 DIAGNOSIS — J90 Pleural effusion, not elsewhere classified: Secondary | ICD-10-CM

## 2017-05-14 DIAGNOSIS — I452 Bifascicular block: Secondary | ICD-10-CM | POA: Diagnosis present

## 2017-05-14 DIAGNOSIS — Z96652 Presence of left artificial knee joint: Secondary | ICD-10-CM | POA: Diagnosis present

## 2017-05-14 DIAGNOSIS — Z96642 Presence of left artificial hip joint: Secondary | ICD-10-CM | POA: Diagnosis present

## 2017-05-14 DIAGNOSIS — I110 Hypertensive heart disease with heart failure: Secondary | ICD-10-CM | POA: Diagnosis present

## 2017-05-14 DIAGNOSIS — I5043 Acute on chronic combined systolic (congestive) and diastolic (congestive) heart failure: Secondary | ICD-10-CM | POA: Diagnosis present

## 2017-05-14 DIAGNOSIS — I5042 Chronic combined systolic (congestive) and diastolic (congestive) heart failure: Secondary | ICD-10-CM | POA: Diagnosis not present

## 2017-05-14 DIAGNOSIS — N4889 Other specified disorders of penis: Secondary | ICD-10-CM | POA: Diagnosis present

## 2017-05-14 DIAGNOSIS — J449 Chronic obstructive pulmonary disease, unspecified: Secondary | ICD-10-CM | POA: Diagnosis present

## 2017-05-14 DIAGNOSIS — Z86010 Personal history of colonic polyps: Secondary | ICD-10-CM

## 2017-05-14 DIAGNOSIS — J9612 Chronic respiratory failure with hypercapnia: Secondary | ICD-10-CM | POA: Diagnosis present

## 2017-05-14 DIAGNOSIS — Z87891 Personal history of nicotine dependence: Secondary | ICD-10-CM

## 2017-05-14 DIAGNOSIS — B9562 Methicillin resistant Staphylococcus aureus infection as the cause of diseases classified elsewhere: Secondary | ICD-10-CM | POA: Diagnosis present

## 2017-05-14 LAB — COMPREHENSIVE METABOLIC PANEL
ALT: 20 U/L (ref 17–63)
ANION GAP: 6 (ref 5–15)
AST: 30 U/L (ref 15–41)
Albumin: 2.5 g/dL — ABNORMAL LOW (ref 3.5–5.0)
Alkaline Phosphatase: 80 U/L (ref 38–126)
BUN: 31 mg/dL — ABNORMAL HIGH (ref 6–20)
CHLORIDE: 98 mmol/L — AB (ref 101–111)
CO2: 33 mmol/L — AB (ref 22–32)
Calcium: 8.7 mg/dL — ABNORMAL LOW (ref 8.9–10.3)
Creatinine, Ser: 0.96 mg/dL (ref 0.61–1.24)
GFR calc non Af Amer: >60 mL/min (ref >60)
Glucose, Bld: 126 mg/dL — ABNORMAL HIGH (ref 65–99)
POTASSIUM: 4.2 mmol/L (ref 3.5–5.1)
SODIUM: 137 mmol/L (ref 135–145)
Total Bilirubin: 0.6 mg/dL (ref 0.3–1.2)
Total Protein: 5.7 g/dL — ABNORMAL LOW (ref 6.5–8.1)

## 2017-05-14 LAB — I-STAT CG4 LACTIC ACID, ED
LACTIC ACID, VENOUS: 0.36 mmol/L — AB (ref 0.5–1.9)
Lactic Acid, Venous: 0.32 mmol/L — ABNORMAL LOW (ref 0.5–1.9)

## 2017-05-14 LAB — CBC WITH DIFFERENTIAL/PLATELET
Basophils Absolute: 0 10*3/uL (ref 0.0–0.1)
Basophils Relative: 0 %
Eosinophils Absolute: 0 10*3/uL (ref 0.0–0.7)
Eosinophils Relative: 0 %
HEMATOCRIT: 29.5 % — AB (ref 39.0–52.0)
HEMOGLOBIN: 9 g/dL — AB (ref 13.0–17.0)
LYMPHS ABS: 1.2 10*3/uL (ref 0.7–4.0)
LYMPHS PCT: 19 %
MCH: 29.4 pg (ref 26.0–34.0)
MCHC: 30.5 g/dL (ref 30.0–36.0)
MCV: 96.4 fL (ref 78.0–100.0)
MONOS PCT: 19 %
Monocytes Absolute: 1.2 10*3/uL — ABNORMAL HIGH (ref 0.1–1.0)
NEUTROS ABS: 3.8 10*3/uL (ref 1.7–7.7)
NEUTROS PCT: 62 %
Platelets: 234 10*3/uL (ref 150–400)
RBC: 3.06 MIL/uL — AB (ref 4.22–5.81)
RDW: 15.8 % — ABNORMAL HIGH (ref 11.5–15.5)
WBC: 6.2 10*3/uL (ref 4.0–10.5)

## 2017-05-14 LAB — PROCALCITONIN

## 2017-05-14 LAB — LACTIC ACID, PLASMA: Lactic Acid, Venous: 0.5 mmol/L (ref 0.5–1.9)

## 2017-05-14 LAB — AMMONIA: Ammonia: 27 umol/L (ref 9–35)

## 2017-05-14 LAB — MAGNESIUM: MAGNESIUM: 2 mg/dL (ref 1.7–2.4)

## 2017-05-14 LAB — VANCOMYCIN, TROUGH: Vancomycin Tr: 28 ug/mL (ref 15–20)

## 2017-05-14 LAB — TSH: TSH: 1.806 u[IU]/mL (ref 0.350–4.500)

## 2017-05-14 MED ORDER — TRAMADOL HCL 50 MG PO TABS
50.0000 mg | ORAL_TABLET | Freq: Two times a day (BID) | ORAL | Status: DC | PRN
  Administered 2017-05-15 – 2017-05-18 (×2): 50 mg via ORAL
  Administered 2017-05-20: 100 mg via ORAL
  Administered 2017-05-20: 50 mg via ORAL
  Administered 2017-05-21 – 2017-05-28 (×8): 100 mg via ORAL
  Filled 2017-05-14 (×2): qty 2
  Filled 2017-05-14: qty 1
  Filled 2017-05-14: qty 2
  Filled 2017-05-14: qty 1
  Filled 2017-05-14 (×4): qty 2
  Filled 2017-05-14: qty 1
  Filled 2017-05-14 (×2): qty 2

## 2017-05-14 MED ORDER — FERROUS SULFATE 325 (65 FE) MG PO TABS
325.0000 mg | ORAL_TABLET | Freq: Three times a day (TID) | ORAL | Status: DC
  Administered 2017-05-15 – 2017-05-28 (×39): 325 mg via ORAL
  Filled 2017-05-14 (×38): qty 1

## 2017-05-14 MED ORDER — LORATADINE 10 MG PO TABS
10.0000 mg | ORAL_TABLET | Freq: Every day | ORAL | Status: DC
  Administered 2017-05-15 – 2017-05-28 (×12): 10 mg via ORAL
  Filled 2017-05-14 (×14): qty 1

## 2017-05-14 MED ORDER — AMIODARONE HCL 200 MG PO TABS
200.0000 mg | ORAL_TABLET | Freq: Every day | ORAL | Status: DC
  Administered 2017-05-15 – 2017-05-28 (×14): 200 mg via ORAL
  Filled 2017-05-14 (×14): qty 1

## 2017-05-14 MED ORDER — MIDODRINE HCL 5 MG PO TABS
10.0000 mg | ORAL_TABLET | Freq: Three times a day (TID) | ORAL | Status: DC | PRN
  Administered 2017-05-23: 10 mg via ORAL
  Filled 2017-05-14 (×2): qty 2

## 2017-05-14 MED ORDER — POLYETHYLENE GLYCOL 3350 17 G PO PACK
17.0000 g | PACK | Freq: Two times a day (BID) | ORAL | Status: DC
  Administered 2017-05-15 – 2017-05-27 (×14): 17 g via ORAL
  Filled 2017-05-14 (×20): qty 1

## 2017-05-14 MED ORDER — TAMSULOSIN HCL 0.4 MG PO CAPS
0.4000 mg | ORAL_CAPSULE | Freq: Every day | ORAL | Status: DC
  Administered 2017-05-15 – 2017-05-28 (×13): 0.4 mg via ORAL
  Filled 2017-05-14 (×14): qty 1

## 2017-05-14 MED ORDER — ONDANSETRON HCL 4 MG/2ML IJ SOLN
4.0000 mg | Freq: Four times a day (QID) | INTRAMUSCULAR | Status: DC | PRN
  Administered 2017-05-18 – 2017-05-20 (×2): 4 mg via INTRAVENOUS
  Filled 2017-05-14 (×2): qty 2

## 2017-05-14 MED ORDER — JUVEN PO PACK
1.0000 | PACK | Freq: Two times a day (BID) | ORAL | Status: DC
  Administered 2017-05-15 – 2017-05-16 (×3): 1 via ORAL
  Filled 2017-05-14 (×4): qty 1

## 2017-05-14 MED ORDER — ADULT MULTIVITAMIN W/MINERALS CH
1.0000 | ORAL_TABLET | Freq: Every day | ORAL | Status: DC
  Administered 2017-05-15 – 2017-05-28 (×13): 1 via ORAL
  Filled 2017-05-14 (×14): qty 1

## 2017-05-14 MED ORDER — VANCOMYCIN HCL 10 G IV SOLR: 1500.0000 mg | Freq: Once | INTRAVENOUS | Status: DC

## 2017-05-14 MED ORDER — SODIUM CHLORIDE 0.9 % IV SOLN
Freq: Once | INTRAVENOUS | Status: AC
  Administered 2017-05-14: 15:00:00 via INTRAVENOUS

## 2017-05-14 MED ORDER — ACETAMINOPHEN 500 MG PO TABS
1000.0000 mg | ORAL_TABLET | Freq: Three times a day (TID) | ORAL | Status: DC
  Administered 2017-05-14 – 2017-05-28 (×38): 1000 mg via ORAL
  Filled 2017-05-14 (×40): qty 2

## 2017-05-14 MED ORDER — CEFEPIME HCL 2 G IJ SOLR
2.0000 g | Freq: Once | INTRAMUSCULAR | Status: AC
  Administered 2017-05-14: 2 g via INTRAVENOUS
  Filled 2017-05-14: qty 2

## 2017-05-14 MED ORDER — PREMIER PROTEIN SHAKE
11.0000 [oz_av] | ORAL | Status: DC
  Administered 2017-05-16: 11 [oz_av] via ORAL
  Filled 2017-05-14 (×2): qty 325.31

## 2017-05-14 MED ORDER — ALBUTEROL SULFATE (2.5 MG/3ML) 0.083% IN NEBU
2.5000 mg | INHALATION_SOLUTION | RESPIRATORY_TRACT | Status: DC | PRN
  Administered 2017-05-18: 2.5 mg via RESPIRATORY_TRACT
  Filled 2017-05-14: qty 3

## 2017-05-14 MED ORDER — RIVAROXABAN 20 MG PO TABS
20.0000 mg | ORAL_TABLET | Freq: Every day | ORAL | Status: DC
  Administered 2017-05-14 – 2017-05-27 (×14): 20 mg via ORAL
  Filled 2017-05-14 (×14): qty 1

## 2017-05-14 MED ORDER — MAGNESIUM OXIDE 400 (241.3 MG) MG PO TABS
200.0000 mg | ORAL_TABLET | Freq: Every day | ORAL | Status: DC
  Administered 2017-05-14 – 2017-05-24 (×10): 200 mg via ORAL
  Filled 2017-05-14 (×11): qty 1

## 2017-05-14 MED ORDER — DOCUSATE SODIUM 100 MG PO CAPS
100.0000 mg | ORAL_CAPSULE | Freq: Two times a day (BID) | ORAL | Status: DC
  Administered 2017-05-14 – 2017-05-28 (×25): 100 mg via ORAL
  Filled 2017-05-14 (×27): qty 1

## 2017-05-14 MED ORDER — POTASSIUM CHLORIDE CRYS ER 20 MEQ PO TBCR
20.0000 meq | EXTENDED_RELEASE_TABLET | Freq: Every day | ORAL | Status: DC
  Administered 2017-05-15 – 2017-05-16 (×2): 20 meq via ORAL
  Filled 2017-05-14 (×2): qty 1

## 2017-05-14 MED ORDER — SODIUM CHLORIDE 0.9 % IV SOLN
1.0000 g | INTRAVENOUS | Status: DC
  Administered 2017-05-15 – 2017-05-19 (×5): 1 g via INTRAVENOUS
  Filled 2017-05-14 (×5): qty 1

## 2017-05-14 MED ORDER — BUMETANIDE 1 MG PO TABS
1.0000 mg | ORAL_TABLET | Freq: Two times a day (BID) | ORAL | Status: DC
  Administered 2017-05-14 – 2017-05-15 (×2): 1 mg via ORAL
  Filled 2017-05-14 (×3): qty 1

## 2017-05-14 MED ORDER — TIOTROPIUM BROMIDE MONOHYDRATE 18 MCG IN CAPS
18.0000 ug | ORAL_CAPSULE | Freq: Every day | RESPIRATORY_TRACT | Status: DC
  Administered 2017-05-16 – 2017-05-28 (×9): 18 ug via RESPIRATORY_TRACT
  Filled 2017-05-14 (×2): qty 5

## 2017-05-14 MED ORDER — ONDANSETRON HCL 4 MG PO TABS
4.0000 mg | ORAL_TABLET | Freq: Four times a day (QID) | ORAL | Status: DC | PRN
  Administered 2017-05-22: 4 mg via ORAL
  Filled 2017-05-14: qty 1

## 2017-05-14 MED ORDER — MAGNESIUM 250 MG PO TABS: 250.0000 mg | ORAL_TABLET | Freq: Every day | ORAL | Status: DC

## 2017-05-14 MED ORDER — SODIUM CHLORIDE 0.9 % IV BOLUS (SEPSIS)
500.0000 mL | Freq: Once | INTRAVENOUS | Status: AC
  Administered 2017-05-14: 500 mL via INTRAVENOUS

## 2017-05-14 MED ORDER — MELATONIN 3 MG PO CAPS: 3.0000 mg | ORAL_CAPSULE | ORAL | Status: DC | PRN

## 2017-05-14 NOTE — ED Triage Notes (Signed)
Pt family requested that pt get looked at. Pt has Hx of sepsis due to sacral ulcer that sepsis originated from. Ulcer is still trying to heal. Pt Fine motor skills are not within normal limits and is not normal for pt. Pt is from nursing facility.

## 2017-05-14 NOTE — H&P (Signed)
History and Physical  [name] Heeter [name] JXB:[id number] DOB: [birth year] DOA: 05/14/2017   PCP: [name], MD   Patient coming from: Home  Chief Complaint: mental status change  HPI:  [name] is a 83 y.o. male with medical history of persistent atrial fibrillation, systolic and diastolic CHF, COPD, coccygeal osteomyelitis, hyperlipidemia presenting with fluctuating mental status since May 11, 2017.  The patient was recently discharged from the hospital after a stay from 02/07/17 through 02/21/17 when he was treated for sepsis secondary to sacral osteomyelitis.  A PICC line was placed, and the patient was discharged with antibiotics to finish on 03/22/2017.  His PICC line was subsequently removed after he finished his antibiotics.  He continued to receive wound care.  During the wound care visit on 04/20/2017, the patient was noted to have necrotic bone in his coccyx.  Bone biopsy and cultures were sent and were consistent with osteomyelitis.  Bone cultures grew group B streptococcus, diphtheroids, E. coli, and MRSA a PICC line was placed on 03/29/2017, the patient was started on vancomycin and ertapenem  With instructions to finish on 06/07/2017.  The patient was doing fairly well up until the last 3 days when his family noted that he has been having intermittent episodes of somnolence and difficulty to arouse.  In between these episodes, the patient will be fully lucid and able to speak with clarity.  On the morning of 05/14/2017, the patient was difficult to arouse, and was somnolent after he was awoken.  As result, the patient was transferred to the emergency department for further evaluation.  In the emergency department, the patient had a low-grade temperature 100.69F with soft systolicblood  pressures in the 90s and low 100s.  Oxygen saturation was 97% on 2 L.  WBC was 6.2 with hemoglobin 9.0.  BMP and LFTs were unremarkable.  Chest x-ray showed small left pleural effusion with  patchy opacities in the right lower lobe and left lower lobe.  The patient was given 500 cc of saline in the emergency department and admission requested for further evaluation.  Assessment/Plan: Acute encephalopathy -Likely multifactorial with considerations including possible UTI and possible seizure secondary to ertapenem and tramadol -Continue ertapenem and tramadol for now as the patient is alert and oriented x4 in the emergency department and monitor clinically during the hospitalization -Obtain EEG -If there is clinical concern for seizure-like activity during hospitalization, would discontinue ertapenem at that point -His mental status may also be due to gabapentin--discontinue for now -Discontinue Pepcid Check TSH -Serum B12 -Check ammonia -Check UA and urine culture -repeat blood culture -foley catheter changed 05/13/17  Coccygeal osteomyelitis -Bone biopsy and culture 04/20/2017 as discussed above -Continue vancomycin and ertapenem  Chronic respiratory failure with hypoxia -intermittently on oxygen 1-2 L at SNF -was d/ced with 1L [place] on 02/21/17  Permanent atrial fibrillation -Rate controlled -Continue rivaroxaban -Continue amiodarone  Chronic systolic and diastolic CHF -02/21/2017 discharge weight 203 -Daily weights -Continue home dose Bumex -09/04/2015 Echo EF 50-55%, grade 1 DD previous EF 40%;   COPD -Continue Spiriva -Stable without exacerbation   hyperlipidemia -Continue statin  Obesity  - Body mass index is 35.3 kg/m.          Past Medical History:  Diagnosis Date  . A-fib (HCC)   . Acute systolic CHF (congestive heart failure), NYHA class 3 -- Unclear etilogy (? Afib related) EF down from 60-70% to 40%. 07/05/2012   Echo 2/21: LV upper limits of normal.  EF- 40%. Cannot assess Diastolic function - Aortic Sclerosis, Mod-Severe Left Atrial dilation; Mild RV & RA dilation; Moderately elevated PA peak pressure: 43mm Hg     . Arthritis    knees  .  Asthma   . ASTHMA 02/06/2007   Qualifier: Diagnosis of  By: Claiborne Billings CMA, Terance Ice    . BENIGN PROSTATIC HYPERTROPHY 05/13/2010   Qualifier: Diagnosis of  By: Amador Cunas  MD, Janett Labella   . Bifascicular block 12/18/2013  . BPH (benign prostatic hypertrophy)   . Bronchospasm, exercise-induced   . CHF (congestive heart failure) (HCC)    systolic, EF 40% (07/07/2012)  . Chronic kidney disease    stabilized, due to infection  . Chronic rhinitis 06/02/2009   Qualifier: Diagnosis of  By: Lovell Sheehan MD, Balinda Quails   . DJD (degenerative joint disease), lumbar 01/18/2012  . Dyslipidemia   . Edema    Bilateral - left lower leg greater than right- wears compression stockings  . General weakness 07/05/2012  . GERD (gastroesophageal reflux disease)   . History of elevated glucose 10/01/2009   Qualifier: Diagnosis of  By: Lovell Sheehan MD, Balinda Quails   . Hx of colonic polyps 11/02/2011  . Hypertension   . INSOMNIA, CHRONIC, MILD 01/02/2008   Qualifier: Diagnosis of  By: Lovell Sheehan MD, Balinda Quails   . Macular degeneration 08-06-13   bilateral -sight impaired  . Obesity   . Paroxysmal Atrial fibrillation - admitted with RVR 01/18/2012  . S/P left THA, AA 08/13/2013  . Shortness of breath    Past Surgical History:  Procedure Laterality Date  . CARDIOVERSION N/A 07/07/2012   Procedure: CARDIOVERSION;  Surgeon: Chrystie Nose, MD;  Location: [facility] ENDOSCOPY;  Service: Cardiovascular;  Laterality: N/A;  . CARDIOVERSION N/A 08/30/2012   Procedure: CARDIOVERSION;  Surgeon: Chrystie Nose, MD;  Location: [facility] ENDOSCOPY;  Service: Cardiovascular;  Laterality: N/A;  . IR FLUORO GUIDE CV LINE RIGHT  04/15/2017  . IR US GUIDE VASC ACCESS RIGHT  04/15/2017  . IRRIGATION AND DEBRIDEMENT ABSCESS N/A 02/08/2017   Procedure: IRRIGATION AND DEBRIDEMENT SACRAL ULCER;  Surgeon: Berna Bue, MD;  Location: [facility] ORS;  Service: General;  Laterality: N/A;  . TOTAL HIP ARTHROPLASTY Left 08/13/2013   Procedure: LEFT TOTAL HIP ARTHROPLASTY ANTERIOR  APPROACH;  Surgeon: [name], MD;  Location: [facility] ORS;  Service: Orthopedics;  Laterality: Left;  . TOTAL KNEE ARTHROPLASTY Left 01/24/2017   Procedure: LEFT TOTAL KNEE ARTHROPLASTY;  Surgeon: Durene Romans, MD;  Location: [facility] ORS;  Service: Orthopedics;  Laterality: Left;  90 mins  . TRANSTHORACIC ECHOCARDIOGRAM  07/07/2012   ef 40%; mild MR; LA mod-severely dilated; RA mildly dilated; RV systolic function mildly reduced; RV systolic pressure increase - mod pulm htn  . VASCULAR SURGERY  removed varicose veins  . VASECTOMY     Social History:  reports that he quit smoking about 53 years ago. he has never used smokeless tobacco. He reports that he does not drink alcohol or use drugs.   Family History  Problem Relation Age of Onset  . Alzheimer's disease Mother   . Heart disease Father      Allergies  Allergen Reactions  . Nsaids     Stomach pain  . Vicodin [Hydrocodone-Acetaminophen] Other (See Comments)    Unknown reaction many years ago  . Morphine And Related Nausea And Vomiting    Dizziness, light headed. "Opiates cause tightness in chest".     Prior to Admission medications   Medication Sig Start Date End  Date Taking? Authorizing Provider  acetaminophen (TYLENOL) 500 MG tablet Take 2 tablets (1,000 mg total) by mouth every 8 (eight) hours. 01/24/17  Yes Babish, Molli Hazard, [name]  albuterol (PROVENTIL HFA;VENTOLIN HFA) 108 (90 Base) MCG/ACT inhaler Inhale 2 puffs into the lungs. Every 6 hours as needed for wheezing or shortness of breath   Yes [provider]  amiodarone (PACERONE) 200 MG tablet TAKE ONE TABLET BY MOUTH ONCE DAILY 10/29/16  Yes Hilty, Lisette Abu, MD  bumetanide (BUMEX) 1 MG tablet Take 1 tablet (1 mg total) by mouth 2 (two) times daily. 10/15/16  Yes Hilty, Lisette Abu, MD  cetirizine (ZYRTEC) 10 MG tablet Take 10 mg by mouth daily as needed for allergies.   Yes [provider]  docusate sodium 100 MG CAPS Take 100 mg by mouth 2 (two) times daily.  08/15/13  Yes Babish, Molli Hazard, [name]  ertapenem 1 g in sodium chloride 0.9 % 50 mL Inject 1 g into the vein. 04/26/17 06/07/17 Yes [provider]  famotidine (PEPCID) 20 MG tablet Take 1 tablet (20 mg total) by mouth 2 (two) times daily. 02/21/17  Yes Tyrone Nine, MD  ferrous sulfate (FERROUSUL) 325 (65 FE) MG tablet Take 1 tablet (325 mg total) by mouth 3 (three) times daily with meals. 01/24/17  Yes Babish, Molli Hazard, [name]  gabapentin (NEURONTIN) 400 MG capsule TAKE 1 CAPSULE BY MOUTH THREE TIMES DAILY 01/14/17  Yes [name], MD  KLOR-CON M20 20 MEQ tablet TAKE ONE TABLET BY MOUTH ONCE DAILY 11/12/16  Yes [name], MD  Magnesium 250 MG TABS Take 250 mg by mouth daily.    Yes [provider]  Melatonin 3 MG CAPS Take 3 mg by mouth as needed (insomnia).   Yes [provider]  midodrine (PROAMATINE) 10 MG tablet TAKE ONE TABLET BY MOUTH ONCE DAILY AS NEEDED FOR  SYSTOLIC  BLOOD  PRESSURE  (TOP  NUMBER)  UNDER  90 08/05/16  Yes Hilty, Lisette Abu, MD  Multiple Vitamin (MULTIVITAMIN WITH MINERALS) TABS tablet Take 1 tablet by mouth daily.   Yes [provider]  Multiple Vitamins-Minerals (ICAPS AREDS FORMULA PO) Take 1 capsule by mouth 2 (two) times daily.    Yes [provider]  nutrition supplement, JUVEN, (JUVEN) PACK Take 1 packet by mouth 2 (two) times daily between meals. 02/21/17  Yes Tyrone Nine, MD  polyethylene glycol (MIRALAX / GLYCOLAX) packet Take 17 g by mouth 2 (two) times daily. 01/24/17  Yes Babish, Molli Hazard, [name]  protein supplement shake (PREMIER PROTEIN) LIQD Take 325 mLs (11 oz total) by mouth daily. 02/21/17  Yes Tyrone Nine, MD  rivaroxaban (XARELTO) 20 MG TABS tablet Take 1 tablet (20 mg total) by mouth daily. 08/05/15  Yes Hilty, Lisette Abu, MD  SPIRIVA HANDIHALER 18 MCG inhalation capsule INHALE ONE PUFF BY MOUTH ONCE DAILY 03/18/16  Yes [name], MD  tamsulosin (FLOMAX) 0.4 MG CAPS capsule TAKE 1 CAPSULE BY  MOUTH ONCE DAILY 12/24/16  Yes [name], MD  traMADol (ULTRAM) 50 MG tablet Take 1-2 tablets (50-100 mg total) by mouth every 12 (twelve) hours as needed for moderate pain or severe pain (and before hydrotherapy). 02/21/17  Yes Tyrone Nine, MD  vancomycin IVPB Inject 1,000 mg into the vein every 12 (twelve) hours.  04/26/17 06/07/17 Yes [provider]  rosuvastatin (CRESTOR) 20 MG tablet TAKE ONE TABLET BY MOUTH ONCE A WEEK, FRIDAY Patient not taking: Reported on 05/14/2017 12/24/16  Chrystie Nose, MD    Review of Systems:  Constitutional:  No weight loss, night sweats, Fevers, chills, fatigue.  Head&Eyes: No headache.  No vision loss.  No eye pain or scotoma ENT:  No Difficulty swallowing,Tooth/dental problems,Sore throat,  No ear ache, post nasal drip,  Cardio-vascular:  No chest pain, Orthopnea, PND, swelling in lower extremities,  dizziness, palpitations  GI:  No  abdominal pain, nausea, vomiting, diarrhea, loss of appetite, hematochezia, melena, heartburn, indigestion, Resp:  No shortness of breath with exertion or at rest. No cough. No coughing up of blood .No wheezing.No chest wall deformity  Skin:  no rash or lesions.  GU:  no dysuria, change in color of urine, no urgency or frequency. No flank pain.  Musculoskeletal:  No joint pain or swelling. No decreased range of motion. No back pain.  Psych:  No change in mood or affect. No depression or anxiety. Neurologic: No headache, no dysesthesia, no focal weakness, no vision loss. No syncope  Physical Exam: Vitals:   05/14/17 1515 05/14/17 1615 05/14/17 1715 05/14/17 1730  BP: (!) 92/49 (!) 95/53 (!) 96/50 (!) 100/51  Pulse: 65 64 65 67  Resp: (!) 22 (!) 29 (!) 28 (!) 33  Temp:      TempSrc:      SpO2: 95% 94% 97% 96%  Weight:      Height:       General:  A&O x 3, NAD, nontoxic, pleasant/cooperative Head/Eye: No conjunctival hemorrhage, no icterus, [place]/AT, No nystagmus ENT:  No icterus,  No  thrush, good dentition, no pharyngeal exudate Neck:  No masses, no lymphadenpathy, no bruits CV:  IRRR, no rub, no gallop, no S3 Lung:  Bibasilar crackles, no wheeze Abdomen: soft/NT, +BS, nondistended, no peritoneal signs Ext: No cyanosis, No rashes, No petechiae, No lymphangitis, trace LE edema Neuro: CNII-XII intact, strength 4/5 in bilateral upper and lower extremities, no dysmetria  Labs on Admission:  Basic Metabolic Panel: Recent Labs  Lab 05/14/17 1316  NA 137  K 4.2  CL 98*  CO2 33*  GLUCOSE 126*  BUN 31*  CREATININE 0.96  CALCIUM 8.7*   Liver Function Tests: Recent Labs  Lab 05/14/17 1316  AST 30  ALT 20  ALKPHOS 80  BILITOT 0.6  PROT 5.7*  ALBUMIN 2.5*   No results for input(s): LIPASE, AMYLASE in the last 168 hours. No results for input(s): AMMONIA in the last 168 hours. CBC: Recent Labs  Lab 05/14/17 1316  WBC 6.2  NEUTROABS 3.8  HGB 9.0*  HCT 29.5*  MCV 96.4  PLT 234   Coagulation Profile: No results for input(s): INR, PROTIME in the last 168 hours. Cardiac Enzymes: No results for input(s): CKTOTAL, CKMB, CKMBINDEX, TROPONINI in the last 168 hours. BNP: Invalid input(s): POCBNP CBG: No results for input(s): GLUCAP in the last 168 hours. Urine analysis:    Component Value Date/Time   COLORURINE YELLOW 02/07/2017 2158   APPEARANCEUR HAZY (A) 02/07/2017 2158   LABSPEC 1.010 02/07/2017 2158   PHURINE 5.0 02/07/2017 2158   GLUCOSEU NEGATIVE 02/07/2017 2158   HGBUR NEGATIVE 02/07/2017 2158   BILIRUBINUR NEGATIVE 02/07/2017 2158   KETONESUR NEGATIVE 02/07/2017 2158   PROTEINUR NEGATIVE 02/07/2017 2158   UROBILINOGEN 0.2 08/06/2013 1006   NITRITE NEGATIVE 02/07/2017 2158   LEUKOCYTESUR SMALL (A) 02/07/2017 2158   Sepsis Labs: @LABRCNTIP (procalcitonin:4,lacticidven:4) )No results found for this or any previous visit (from the past 240 hour(s)).   Radiological Exams on Admission: Dg Chest 2 View  Result  Date: 05/14/2017 CLINICAL DATA:   83 year old male with a history of weakness. EXAM: CHEST  2 VIEW COMPARISON:  02/18/2017 FINDINGS: Cardiomediastinal silhouette likely unchanged with rightward rotation accentuating the mediastinal contour. Calcifications of the aortic arch and descending thoracic aorta. Right upper extremity PICC, appears to terminate superior vena cava. Low lung volumes compare to the prior. Patchy opacities bilateral lungs. Blunting of the costophrenic sulcus on the lateral view. IMPRESSION: Lateral view demonstrates evidence of small pleural effusions. Low lung volumes with patchy opacities potentially representing chronic changes, atelectasis, and/or consolidation. Right upper extremity PICC appears to terminate in the superior vena cava. Electronically Signed   By: Gilmer MorJaime  Wagner D.O.   On: 05/14/2017 14:15   Ct Head Wo Contrast  Result Date: 05/14/2017 CLINICAL DATA:  History of sepsis and altered level of consciousness EXAM: CT HEAD WITHOUT CONTRAST TECHNIQUE: Contiguous axial images were obtained from the base of the skull through the vertex without intravenous contrast. COMPARISON:  None. FINDINGS: Brain: Mild atrophic changes and chronic white matter ischemic changes are seen. No findings to suggest acute hemorrhage, acute infarction or space-occupying mass lesion are noted. Vascular: No hyperdense vessel or unexpected calcification. Skull: Normal. Negative for fracture or focal lesion. Sinuses/Orbits: No acute finding. Other: None. IMPRESSION: Chronic atrophic and ischemic changes without acute abnormality. Electronically Signed   By: Alcide CleverMark  Lukens M.D.   On: 05/14/2017 15:57    EKG: Independently reviewed. pending    Time spent:70 minutes Code Status:   Partial DNR--no intubation Family Communication:  Son and daughter updated at bedside Disposition Plan: expect 1-2 day hospitalization Consults called: none DVT Prophylaxis: Xarelto  Catarina HartshornDavid Jashayla Glatfelter, DO  [facility] Pager [phone]  If 7PM-7AM,  please contact night-coverage [email or web address] Password [email or web address] 05/14/2017, 6:23 PM

## 2017-05-14 NOTE — ED Notes (Signed)
ED TO INPATIENT HANDOFF REPORT  Name/Age/Gender [name] 83 y.o. male  Code Status Code Status History    Date Active Date Inactive Code Status Order ID Comments User Context   02/16/2017 16:04 02/21/2017 20:37 Partial Code [id number]  Theodis Blaze, MD Inpatient   02/08/2017 00:33 02/16/2017 16:04 DNR [id number]  Vianne Bulls, MD ED   01/24/2017 10:51 01/28/2017 17:40 Full Code [id number]  Danae Orleans, [name] Inpatient   08/13/2013 16:01 08/15/2013 17:47 Full Code [id number]  Pricilla Loveless, [name] Inpatient   07/05/2012 16:03 07/14/2012 19:57 Full Code [id number]  Verlee Monte, MD Inpatient   01/19/2012 08:53 01/24/2012 16:45 DNR [id number]  Cherene Altes, MD Inpatient    Questions for Most Recent Historical Code Status (Order [id number])    Question Answer Comment   In the event of cardiac or respiratory ARREST: Initiate Code Blue, Call Rapid Response Yes    In the event of cardiac or respiratory ARREST: Perform CPR Yes    In the event of cardiac or respiratory ARREST: Perform Intubation/Mechanical Ventilation No    In the event of cardiac or respiratory ARREST: Use NIPPV/BiPAp only if indicated Yes    In the event of cardiac or respiratory ARREST: Administer ACLS medications if indicated Yes    In the event of cardiac or respiratory ARREST: Perform Defibrillation or Cardioversion if indicated Yes         Advance Directive Documentation     Most Recent Value  Type of Advance Directive  Healthcare Power of Attorney  Pre-existing out of facility DNR order (yellow form or pink MOST form)  No data  "MOST" Form in Place?  No data      Home/SNF/Other Home  Chief Complaint Alt Mental Status  Level of Care/Admitting Diagnosis ED Disposition    ED Disposition Condition Comment   Admit  Hospital Area: [facility] [[id number]]  Level of Care: Telemetry [5]  Admit to tele based on following criteria: Other see comments  Comments: monitor for dysrhythmia in pt with  AMS  Diagnosis: Acute encephalopathy [211941]  Admitting Physician: TAT, [name] [[id number]]  Attending Physician: TAT, [name] [[id number]]  PT Class (Do Not Modify): Observation [104]  PT Acc Code (Do Not Modify): Observation [10022]       Medical History Past Medical History:  Diagnosis Date  . A-fib ([place])   . Acute systolic CHF (congestive heart failure), NYHA class 3 -- Unclear etilogy (? Afib related) EF down from 60-70% to 40%. 07/05/2012   Echo 2/21: LV upper limits of normal. EF- 40%. Cannot assess Diastolic function - Aortic Sclerosis, Mod-Severe Left Atrial dilation; Mild RV & RA dilation; Moderately elevated PA peak pressure: 75m Hg     . Arthritis    knees  . Asthma   . ASTHMA 02/06/2007   Qualifier: Diagnosis of  By: CRogue BussingCMA, JMaryann Alar   . BENIGN PROSTATIC HYPERTROPHY 05/13/2010   Qualifier: Diagnosis of  By: KBurnice Logan MD, PDoretha Sou  . Bifascicular block 12/18/2013  . BPH (benign prostatic hypertrophy)   . Bronchospasm, exercise-induced   . CHF (congestive heart failure) (H[facility])    systolic, EF 474%(20/81/4481  . Chronic kidney disease    stabilized, due to infection  . Chronic rhinitis 06/02/2009   Qualifier: Diagnosis of  By: JArnoldo MoraleMD, JBalinda Quails  . DJD (degenerative joint disease), lumbar 01/18/2012  . Dyslipidemia   . Edema    Bilateral - left lower leg greater than right- wears compression  stockings  . General weakness 07/05/2012  . GERD (gastroesophageal reflux disease)   . History of elevated glucose 10/01/2009   Qualifier: Diagnosis of  By: Arnoldo Morale MD, Balinda Quails   . Hx of colonic polyps 11/02/2011  . Hypertension   . INSOMNIA, CHRONIC, MILD 01/02/2008   Qualifier: Diagnosis of  By: Arnoldo Morale MD, Balinda Quails   . Macular degeneration 08-06-13   bilateral -sight impaired  . Obesity   . Paroxysmal Atrial fibrillation - admitted with RVR 01/18/2012  . S/P left THA, AA 08/13/2013  . Shortness of breath     Allergies Allergies  Allergen Reactions  . Nsaids     Stomach pain  .  Vicodin [Hydrocodone-Acetaminophen] Other (See Comments)    Unknown reaction many years ago  . Morphine And Related Nausea And Vomiting    Dizziness, light headed. "Opiates cause tightness in chest".    IV Location/Drains/Wounds Patient Lines/Drains/Airways Status   Active Line/Drains/Airways    Name:   Placement date:   Placement time:   Site:   Days:   PICC Single Lumen 62/13/08 PICC Right Basilic 42 cm 0 cm   65/78/46    9629    Basilic   29   Urethral Catheter B.Shaw Coude 20 Fr.   02/12/17    1610    Coude   91   Incision (Closed) 02/08/17 Other (Comment)   02/08/17    1617     95   Pressure Injury 02/08/17   02/08/17    0800     95   Wound / Incision (Open or Dehisced) 02/09/17 Other (Comment) Sacrum Medial lareg openn  nstage 4 wound, post surgical debridement.   02/09/17    1448    Sacrum   94          Labs/Imaging Results for orders placed or performed during the hospital encounter of 05/14/17 (from the past 48 hour(s))  Comprehensive metabolic panel     Status: Abnormal   Collection Time: 05/14/17  1:16 PM  Result Value Ref Range   Sodium 137 135 - 145 mmol/L   Potassium 4.2 3.5 - 5.1 mmol/L   Chloride 98 (L) 101 - 111 mmol/L   CO2 33 (H) 22 - 32 mmol/L   Glucose, Bld 126 (H) 65 - 99 mg/dL   BUN 31 (H) 6 - 20 mg/dL   Creatinine, Ser 0.96 0.61 - 1.24 mg/dL   Calcium 8.7 (L) 8.9 - 10.3 mg/dL   Total Protein 5.7 (L) 6.5 - 8.1 g/dL   Albumin 2.5 (L) 3.5 - 5.0 g/dL   AST 30 15 - 41 U/L   ALT 20 17 - 63 U/L   Alkaline Phosphatase 80 38 - 126 U/L   Total Bilirubin 0.6 0.3 - 1.2 mg/dL   GFR calc non Af Amer >60 >60 mL/min   GFR calc Af Amer >60 >60 mL/min    Comment: (NOTE) The eGFR has been calculated using the CKD EPI equation. This calculation has not been validated in all clinical situations. eGFR's persistently <60 mL/min signify possible Chronic Kidney Disease.    Anion gap 6 5 - 15  CBC with Differential     Status: Abnormal   Collection Time: 05/14/17  1:16  PM  Result Value Ref Range   WBC 6.2 4.0 - 10.5 K/uL   RBC 3.06 (L) 4.22 - 5.81 MIL/uL   Hemoglobin 9.0 (L) 13.0 - 17.0 g/dL   HCT 29.5 (L) 39.0 - 52.0 %   MCV 96.4  78.0 - 100.0 fL   MCH 29.4 26.0 - 34.0 pg   MCHC 30.5 30.0 - 36.0 g/dL   RDW 15.8 (H) 11.5 - 15.5 %   Platelets 234 150 - 400 K/uL   Neutrophils Relative % 62 %   Neutro Abs 3.8 1.7 - 7.7 K/uL   Lymphocytes Relative 19 %   Lymphs Abs 1.2 0.7 - 4.0 K/uL   Monocytes Relative 19 %   Monocytes Absolute 1.2 (H) 0.1 - 1.0 K/uL   Eosinophils Relative 0 %   Eosinophils Absolute 0.0 0.0 - 0.7 K/uL   Basophils Relative 0 %   Basophils Absolute 0.0 0.0 - 0.1 K/uL  I-Stat CG4 Lactic Acid, ED     Status: Abnormal   Collection Time: 05/14/17  1:32 PM  Result Value Ref Range   Lactic Acid, Venous 0.36 (L) 0.5 - 1.9 mmol/L  Lactic acid, plasma     Status: None   Collection Time: 05/14/17  2:57 PM  Result Value Ref Range   Lactic Acid, Venous 0.5 0.5 - 1.9 mmol/L  I-Stat CG4 Lactic Acid, ED     Status: Abnormal   Collection Time: 05/14/17  3:06 PM  Result Value Ref Range   Lactic Acid, Venous 0.32 (L) 0.5 - 1.9 mmol/L   Dg Chest 2 View  Result Date: 05/14/2017 CLINICAL DATA:  83 year old male with a history of weakness. EXAM: CHEST  2 VIEW COMPARISON:  02/18/2017 FINDINGS: Cardiomediastinal silhouette likely unchanged with rightward rotation accentuating the mediastinal contour. Calcifications of the aortic arch and descending thoracic aorta. Right upper extremity PICC, appears to terminate superior vena cava. Low lung volumes compare to the prior. Patchy opacities bilateral lungs. Blunting of the costophrenic sulcus on the lateral view. IMPRESSION: Lateral view demonstrates evidence of small pleural effusions. Low lung volumes with patchy opacities potentially representing chronic changes, atelectasis, and/or consolidation. Right upper extremity PICC appears to terminate in the superior vena cava. Electronically Signed   By: Corrie Mckusick D.O.   On: 05/14/2017 14:15   Ct Head Wo Contrast  Result Date: 05/14/2017 CLINICAL DATA:  History of sepsis and altered level of consciousness EXAM: CT HEAD WITHOUT CONTRAST TECHNIQUE: Contiguous axial images were obtained from the base of the skull through the vertex without intravenous contrast. COMPARISON:  None. FINDINGS: Brain: Mild atrophic changes and chronic white matter ischemic changes are seen. No findings to suggest acute hemorrhage, acute infarction or space-occupying mass lesion are noted. Vascular: No hyperdense vessel or unexpected calcification. Skull: Normal. Negative for fracture or focal lesion. Sinuses/Orbits: No acute finding. Other: None. IMPRESSION: Chronic atrophic and ischemic changes without acute abnormality. Electronically Signed   By: Inez Catalina M.D.   On: 05/14/2017 15:57    Pending Labs Unresulted Labs (From admission, onward)   Start     Ordered   05/14/17 1747  Culture, blood (Routine X 2) w Reflex to ID Panel  BLOOD CULTURE X 2,   R    Comments:  Please draw one set from PICC and one set by venipuncture    05/14/17 1747   05/14/17 1720  Vancomycin, trough  STAT,   TIMED     05/14/17 1719   05/14/17 1701  Culture, blood (routine x 2) Call MD if unable to obtain prior to antibiotics being given  BLOOD CULTURE X 2,   R    Comments:  If blood cultures drawn in Emergency Department - Do not draw and cancel order    05/14/17 1701  05/14/17 1701  Gram stain  Once,   R     05/14/17 1701   05/14/17 1306  Urinalysis, Routine w reflex microscopic  STAT,   STAT     05/14/17 1306   Signed and Held  TSH  Once,   R     Signed and Held   Signed and Held  Urinalysis, Complete w Microscopic  Once,   R     Signed and Held   Signed and Held  Basic metabolic panel  Tomorrow morning,   R     Signed and Held   Signed and Held  CBC  Tomorrow morning,   R     Signed and Held   Signed and Held  Vitamin B12  Once,   R     Signed and Held   Signed and Held   Ammonia  Once,   R     Signed and Held   Signed and Held  Culture, Urine  Once,   R     Signed and Held   Visual merchandiser and Held  Magnesium  Once,   R     Signed and Held   Signed and Held  Procalcitonin - Baseline  STAT,   STAT     Signed and Held      Vitals/Pain Today's Vitals   05/14/17 1730 05/14/17 1815 05/14/17 1847 05/14/17 1900  BP: (!) 100/51 (!) 107/58 (!) 103/49 (!) 101/55  Pulse: 67 71 65 64  Resp: (!) 33 (!) 28 (!) 28 (!) 27  Temp:      TempSrc:      SpO2: 96% 95% 95% 95%  Weight:      Height:        Isolation Precautions No active isolations  Medications Medications  0.9 %  sodium chloride infusion ( Intravenous New Bag/Given 05/14/17 1449)  0.9 %  sodium chloride infusion ( Intravenous New Bag/Given 05/14/17 1512)  ceFEPIme (MAXIPIME) 2 g in dextrose 5 % 50 mL IVPB (2 g Intravenous New Bag/Given 05/14/17 1829)  sodium chloride 0.9 % bolus 500 mL (500 mLs Intravenous New Bag/Given 05/14/17 1846)    Mobility walks with person assist

## 2017-05-14 NOTE — ED Provider Notes (Signed)
[facility]-EMERGENCY DEPT Provider Note   CSN: [id number] Arrival date & time: 05/14/17  1227     History   Chief Complaint Chief Complaint  Patient presents with  . Hypotension    HPI [name] is a 83 y.o. male.  The history is provided by the patient. No language interpreter was used.  Weakness  Primary symptoms include no focal weakness, no loss of sensation. This is a new problem. The current episode started more than 2 days ago. The problem has not changed since onset.There was no focality noted. The maximum temperature recorded prior to his arrival was 100 to 100.9 F. Associated symptoms include altered mental status and confusion. Pertinent negatives include no shortness of breath. Associated medical issues do not include trauma or a bleeding disorder.  Pt has had trouble putting his glasses on today.  Family reports pt is shaky.  Family reports wound vac came off yesterday. Family reports pt not eating or drinking normally.  EMs reported pt blood pressure was in the 50's Improved with Iv fluids.   Past Medical History:  Diagnosis Date  . A-fib (HCC)   . Acute systolic CHF (congestive heart failure), NYHA class 3 -- Unclear etilogy (? Afib related) EF down from 60-70% to 40%. 07/05/2012   Echo 2/21: LV upper limits of normal. EF- 40%. Cannot assess Diastolic function - Aortic Sclerosis, Mod-Severe Left Atrial dilation; Mild RV & RA dilation; Moderately elevated PA peak pressure: 43mm Hg     . Arthritis    knees  . Asthma   . ASTHMA 02/06/2007   Qualifier: Diagnosis of  By: Claiborne Billings CMA, Terance Ice    . BENIGN PROSTATIC HYPERTROPHY 05/13/2010   Qualifier: Diagnosis of  By: Amador Cunas  MD, Janett Labella   . Bifascicular block 12/18/2013  . BPH (benign prostatic hypertrophy)   . Bronchospasm, exercise-induced   . CHF (congestive heart failure) (HCC)    systolic, EF 40% (07/07/2012)  . Chronic kidney disease    stabilized, due to infection  . Chronic  rhinitis 06/02/2009   Qualifier: Diagnosis of  By: Lovell Sheehan MD, Balinda Quails   . DJD (degenerative joint disease), lumbar 01/18/2012  . Dyslipidemia   . Edema    Bilateral - left lower leg greater than right- wears compression stockings  . General weakness 07/05/2012  . GERD (gastroesophageal reflux disease)   . History of elevated glucose 10/01/2009   Qualifier: Diagnosis of  By: Lovell Sheehan MD, Balinda Quails   . Hx of colonic polyps 11/02/2011  . Hypertension   . INSOMNIA, CHRONIC, MILD 01/02/2008   Qualifier: Diagnosis of  By: Lovell Sheehan MD, Balinda Quails   . Macular degeneration 08-06-13   bilateral -sight impaired  . Obesity   . Paroxysmal Atrial fibrillation - admitted with RVR 01/18/2012  . S/P left THA, AA 08/13/2013  . Shortness of breath     Patient Active Problem List   Diagnosis Date Noted  . 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 Infected pressure ulcer 02/07/2017  . Normocytic anemia 02/07/2017  . A-fib (HCC) 01/31/2017  . Polyneuropathy 01/31/2017  . S/P left TKA 01/24/2017  . Non-ischemic cardiomyopathy (HCC) 10/15/2016  . Chronic systolic congestive heart failure (HCC) 05/28/2016  . Orthostatic hypotension 09/20/2015  . Impaired glucose tolerance 11/25/2014  . Bifascicular block 12/18/2013  . Bilateral edema of lower extremity 10/25/2013  . S/P left THA, AA 08/13/2013  . Chronic anticoagulation 12/06/2012  . General weakness 07/05/2012  . Paroxysmal Atrial fibrillation - admitted with  RVR 01/18/2012  . DJD (degenerative joint disease), lumbar 01/18/2012  . Hx of colonic polyps 11/02/2011  . BENIGN PROSTATIC HYPERTROPHY 05/13/2010  . SEBORRHEA CAPITIS 02/13/2010  . CHRONIC RHINITIS 06/02/2009  . Hyperlipidemia 11/01/2008  . INSOMNIA, CHRONIC, MILD 01/02/2008  . Essential hypertension 02/06/2007  . ASTHMA 02/06/2007    Past Surgical History:  Procedure Laterality Date  . CARDIOVERSION N/A 07/07/2012   Procedure: CARDIOVERSION;  Surgeon: Chrystie Nose, MD;  Location: [facility] ENDOSCOPY;   Service: Cardiovascular;  Laterality: N/A;  . CARDIOVERSION N/A 08/30/2012   Procedure: CARDIOVERSION;  Surgeon: Chrystie Nose, MD;  Location: [facility] ENDOSCOPY;  Service: Cardiovascular;  Laterality: N/A;  . IR FLUORO GUIDE CV LINE RIGHT  04/15/2017  . IR US GUIDE VASC ACCESS RIGHT  04/15/2017  . IRRIGATION AND DEBRIDEMENT ABSCESS N/A 02/08/2017   Procedure: IRRIGATION AND DEBRIDEMENT SACRAL ULCER;  Surgeon: Berna Bue, MD;  Location: [facility] ORS;  Service: General;  Laterality: N/A;  . TOTAL HIP ARTHROPLASTY Left 08/13/2013   Procedure: LEFT TOTAL HIP ARTHROPLASTY ANTERIOR APPROACH;  Surgeon: [name], MD;  Location: [facility] ORS;  Service: Orthopedics;  Laterality: Left;  . TOTAL KNEE ARTHROPLASTY Left 01/24/2017   Procedure: LEFT TOTAL KNEE ARTHROPLASTY;  Surgeon: Durene Romans, MD;  Location: [facility] ORS;  Service: Orthopedics;  Laterality: Left;  90 mins  . TRANSTHORACIC ECHOCARDIOGRAM  07/07/2012   ef 40%; mild MR; LA mod-severely dilated; RA mildly dilated; RV systolic function mildly reduced; RV systolic pressure increase - mod pulm htn  . VASCULAR SURGERY  removed varicose veins  . VASECTOMY         Home Medications    Prior to Admission medications   Medication Sig Start Date End Date Taking? Authorizing Provider  acetaminophen (TYLENOL) 500 MG tablet Take 2 tablets (1,000 mg total) by mouth every 8 (eight) hours. 01/24/17  Yes Babish, Molli Hazard, [name]  albuterol (PROVENTIL HFA;VENTOLIN HFA) 108 (90 Base) MCG/ACT inhaler Inhale 2 puffs into the lungs. Every 6 hours as needed for wheezing or shortness of breath   Yes [provider]  amiodarone (PACERONE) 200 MG tablet TAKE ONE TABLET BY MOUTH ONCE DAILY 10/29/16  Yes Hilty, Lisette Abu, MD  bumetanide (BUMEX) 1 MG tablet Take 1 tablet (1 mg total) by mouth 2 (two) times daily. 10/15/16  Yes Hilty, Lisette Abu, MD  cetirizine (ZYRTEC) 10 MG tablet Take 10 mg by mouth daily as needed for allergies.   Yes [provider]  docusate  sodium 100 MG CAPS Take 100 mg by mouth 2 (two) times daily. 08/15/13  Yes Babish, Molli Hazard, [name]  ertapenem 1 g in sodium chloride 0.9 % 50 mL Inject 1 g into the vein. 04/26/17 06/07/17 Yes [provider]  famotidine (PEPCID) 20 MG tablet Take 1 tablet (20 mg total) by mouth 2 (two) times daily. 02/21/17  Yes Tyrone Nine, MD  ferrous sulfate (FERROUSUL) 325 (65 FE) MG tablet Take 1 tablet (325 mg total) by mouth 3 (three) times daily with meals. 01/24/17  Yes Babish, Molli Hazard, [name]  gabapentin (NEURONTIN) 400 MG capsule TAKE 1 CAPSULE BY MOUTH THREE TIMES DAILY 01/14/17  Yes [name], MD  KLOR-CON M20 20 MEQ tablet TAKE ONE TABLET BY MOUTH ONCE DAILY 11/12/16  Yes [name], MD  Magnesium 250 MG TABS Take 250 mg by mouth daily.    Yes [provider]  Melatonin 3 MG CAPS Take 3 mg by mouth as needed (insomnia).   Yes [provider]  midodrine (PROAMATINE) 10 MG tablet TAKE ONE TABLET BY MOUTH ONCE DAILY AS NEEDED FOR  SYSTOLIC  BLOOD  PRESSURE  (TOP  NUMBER)  UNDER  90 08/05/16  Yes Hilty, Lisette Abu, MD  Multiple Vitamin (MULTIVITAMIN WITH MINERALS) TABS tablet Take 1 tablet by mouth daily.   Yes [provider]  Multiple Vitamins-Minerals (ICAPS AREDS FORMULA PO) Take 1 capsule by mouth 2 (two) times daily.    Yes [provider]  nutrition supplement, JUVEN, (JUVEN) PACK Take 1 packet by mouth 2 (two) times daily between meals. 02/21/17  Yes Tyrone Nine, MD  polyethylene glycol (MIRALAX / GLYCOLAX) packet Take 17 g by mouth 2 (two) times daily. 01/24/17  Yes Babish, Molli Hazard, [name]  protein supplement shake (PREMIER PROTEIN) LIQD Take 325 mLs (11 oz total) by mouth daily. 02/21/17  Yes Tyrone Nine, MD  rivaroxaban (XARELTO) 20 MG TABS tablet Take 1 tablet (20 mg total) by mouth daily. 08/05/15  Yes Hilty, Lisette Abu, MD  SPIRIVA HANDIHALER 18 MCG inhalation capsule INHALE ONE PUFF BY MOUTH ONCE DAILY 03/18/16  Yes [name], MD    tamsulosin (FLOMAX) 0.4 MG CAPS capsule TAKE 1 CAPSULE BY MOUTH ONCE DAILY 12/24/16  Yes [name], MD  traMADol (ULTRAM) 50 MG tablet Take 1-2 tablets (50-100 mg total) by mouth every 12 (twelve) hours as needed for moderate pain or severe pain (and before hydrotherapy). 02/21/17  Yes Tyrone Nine, MD  vancomycin IVPB Inject 1,000 mg into the vein every 12 (twelve) hours.  04/26/17 06/07/17 Yes [provider]  rosuvastatin (CRESTOR) 20 MG tablet TAKE ONE TABLET BY MOUTH ONCE A WEEK, FRIDAY Patient not taking: Reported on 05/14/2017 12/24/16   Chrystie Nose, MD    Family History Family History  Problem Relation Age of Onset  . Alzheimer's disease Mother   . Heart disease Father     Social History Social History   Tobacco Use  . Smoking status: Former Smoker    Last attempt to quit: 05/17/1964    Years since quitting: 53.0  . Smokeless tobacco: Never Used  Substance Use Topics  . Alcohol use: No  . Drug use: No     Allergies   Nsaids; Vicodin [hydrocodone-acetaminophen]; and Morphine and related   Review of Systems Review of Systems  Constitutional: Positive for activity change, appetite change, fatigue and fever.  Respiratory: Negative for shortness of breath.   Neurological: Positive for tremors and weakness. Negative for focal weakness.  Psychiatric/Behavioral: Positive for confusion.  All other systems reviewed and are negative.    Physical Exam Updated Vital Signs BP (!) 88/49   Pulse (!) 56   Temp 100.2 F (37.9 C) (Rectal)   Resp (!) 28   Ht 5\' 8"  (1.727 m)   Wt 90.7 kg (200 lb)   SpO2 97%   BMI 30.41 kg/m   Physical Exam  Constitutional: He appears well-developed and well-nourished.  HENT:  Head: Normocephalic.  Right Ear: External ear normal.  Left Ear: External ear normal.  Eyes: Pupils are equal, round, and reactive to light.  Neck: Normal range of motion.  Cardiovascular: Normal rate.  Pulmonary/Chest: Effort normal.   Abdominal: Soft.  Musculoskeletal: Normal range of motion.  Neurological: He is alert.  sleepy  Skin: Skin is warm.  Nursing note and vitals reviewed.   ED Treatments / Results  Labs (all labs ordered are listed, but only abnormal results are displayed) Labs Reviewed  COMPREHENSIVE METABOLIC PANEL - Abnormal;  Notable for the following components:      Result Value   Chloride 98 (*)    CO2 33 (*)    Glucose, Bld 126 (*)    BUN 31 (*)    Calcium 8.7 (*)    Total Protein 5.7 (*)    Albumin 2.5 (*)    All other components within normal limits  CBC WITH DIFFERENTIAL/PLATELET - Abnormal; Notable for the following components:   RBC 3.06 (*)    Hemoglobin 9.0 (*)    HCT 29.5 (*)    RDW 15.8 (*)    Monocytes Absolute 1.2 (*)    All other components within normal limits  I-STAT CG4 LACTIC ACID, ED - Abnormal; Notable for the following components:   Lactic Acid, Venous 0.36 (*)    All other components within normal limits  I-STAT CG4 LACTIC ACID, ED - Abnormal; Notable for the following components:   Lactic Acid, Venous 0.32 (*)    All other components within normal limits  LACTIC ACID, PLASMA  URINALYSIS, ROUTINE W REFLEX MICROSCOPIC    EKG  EKG Interpretation None       Radiology Dg Chest 2 View  Result Date: 05/14/2017 CLINICAL DATA:  83 year old male with a history of weakness. EXAM: CHEST  2 VIEW COMPARISON:  02/18/2017 FINDINGS: Cardiomediastinal silhouette likely unchanged with rightward rotation accentuating the mediastinal contour. Calcifications of the aortic arch and descending thoracic aorta. Right upper extremity PICC, appears to terminate superior vena cava. Low lung volumes compare to the prior. Patchy opacities bilateral lungs. Blunting of the costophrenic sulcus on the lateral view. IMPRESSION: Lateral view demonstrates evidence of small pleural effusions. Low lung volumes with patchy opacities potentially representing chronic changes, atelectasis, and/or  consolidation. Right upper extremity PICC appears to terminate in the superior vena cava. Electronically Signed   By: Gilmer MorJaime  Wagner D.O.   On: 05/14/2017 14:15   Ct Head Wo Contrast  Result Date: 05/14/2017 CLINICAL DATA:  History of sepsis and altered level of consciousness EXAM: CT HEAD WITHOUT CONTRAST TECHNIQUE: Contiguous axial images were obtained from the base of the skull through the vertex without intravenous contrast. COMPARISON:  None. FINDINGS: Brain: Mild atrophic changes and chronic white matter ischemic changes are seen. No findings to suggest acute hemorrhage, acute infarction or space-occupying mass lesion are noted. Vascular: No hyperdense vessel or unexpected calcification. Skull: Normal. Negative for fracture or focal lesion. Sinuses/Orbits: No acute finding. Other: None. IMPRESSION: Chronic atrophic and ischemic changes without acute abnormality. Electronically Signed   By: Alcide CleverMark  Lukens M.D.   On: 05/14/2017 15:57    Procedures Procedures (including critical care time)  Medications Ordered in ED Medications  0.9 %  sodium chloride infusion ( Intravenous New Bag/Given 05/14/17 1512)  0.9 %  sodium chloride infusion ( Intravenous New Bag/Given 05/14/17 1449)     Initial Impression / Assessment and Plan / ED Course  I have reviewed the triage vital signs and the nursing notes.  Pertinent labs & imaging results that were available during my care of the patient were reviewed by me and considered in my medical decision making (see chart for details).     Small pleural effusions, possibly early pneumonia,  Pt awake on re exam asking for water.  Family reports pt is more awake but not normal.  Lactic acid is normal.   Ct head no acute abnormality.  Continued IV fluids.  Consult hospitalist for possible admission    Final Clinical Impressions(s) / ED Diagnoses   Final  diagnoses:  Hypotension, unspecified hypotension type  Altered mental status, unspecified altered mental  status type    ED Discharge Orders    None       Elson Areas, [place] 05/14/17 Geryl Rankins, MD 05/15/17 [phone]

## 2017-05-14 NOTE — ED Notes (Signed)
Spoke with ED Provider, ED Provider will talk with Hospitalist and then inform me of wether or not to start antibiotics and  Continue cultures.

## 2017-05-14 NOTE — ED Notes (Signed)
ED Provider at bedside. 

## 2017-05-14 NOTE — ED Notes (Signed)
Bed: WA01 Expected date: 05/14/17 Expected time: 12:19 PM Means of arrival:  Comments: EMS  83y/o Male with Altered Mental Status & Hypotension

## 2017-05-14 NOTE — Progress Notes (Signed)
Pharmacy Antibiotic Note  [name] is a 83 y.o. male admitted on 05/14/2017 with sepsis/osteomyelitis.  Pharmacy has been consulted for vancomycin dosing.  Plan:  Pt was on a regimen of vancomycin 1000 mg IV q12h PTA  Obtained trough. Last dose per nursing home RN  Was at 0600 this AM, Obtain trough this evening, so level should be accurate based on dosing.  Hold vancomycin for now  Will obtain another trough in 24 hours   Height: 5\' 8"  (172.7 cm) Weight: 200 lb (90.7 kg) IBW/kg (Calculated) : 68.4  Temp (24hrs), Avg:99.5 F (37.5 C), Min:98 F (36.7 C), Max:100.2 F (37.9 C)  Recent Labs  Lab 05/14/17 1316 05/14/17 1332 05/14/17 1457 05/14/17 1506 05/14/17 1720  WBC 6.2  --   --   --   --   CREATININE 0.96  --   --   --   --   LATICACIDVEN  --  0.36* 0.5 0.32*  --   VANCOTROUGH  --   --   --   --  28*    Estimated Creatinine Clearance: 58.2 mL/min (by C-G formula based on SCr of 0.96 mg/dL).    Allergies  Allergen Reactions  . Nsaids     Stomach pain  . Vicodin [Hydrocodone-Acetaminophen] Other (See Comments)    Unknown reaction many years ago  . Morphine And Related Nausea And Vomiting    Dizziness, light headed. "Opiates cause tightness in chest".    Antimicrobials this admission:  Dose adjustments this admission:   Microbiology results:   Thank you for allowing pharmacy to be a part of this patient's care.   Adalberto Cole, PharmD, BCPS Pager [phone] 05/14/2017 7:47 PM

## 2017-05-14 NOTE — Progress Notes (Signed)
PHARMACIST - PHYSICIAN ORDER COMMUNICATION  CONCERNING: P&T Medication Policy on Herbal Medications  DESCRIPTION:  This patient's order for:  Melatonin  has been noted.  This product(s) is classified as an "herbal" or natural product. Due to a lack of definitive safety studies or FDA approval, nonstandard manufacturing practices, plus the potential risk of unknown drug-drug interactions while on inpatient medications, the Pharmacy and Therapeutics Committee does not permit the use of "herbal" or natural products of this type within [place].   ACTION TAKEN: The pharmacy department is unable to verify this order at this time and your patient has been informed of this safety policy. Please reevaluate patient's clinical condition at discharge and address if the herbal or natural product(s) should be resumed at that time.  Iman Reinertsen, PharmD  

## 2017-05-15 ENCOUNTER — Other Ambulatory Visit: Payer: Self-pay

## 2017-05-15 DIAGNOSIS — I429 Cardiomyopathy, unspecified: Secondary | ICD-10-CM | POA: Diagnosis present

## 2017-05-15 DIAGNOSIS — I9589 Other hypotension: Secondary | ICD-10-CM | POA: Diagnosis not present

## 2017-05-15 DIAGNOSIS — G9340 Encephalopathy, unspecified: Secondary | ICD-10-CM | POA: Diagnosis not present

## 2017-05-15 DIAGNOSIS — J310 Chronic rhinitis: Secondary | ICD-10-CM | POA: Diagnosis present

## 2017-05-15 DIAGNOSIS — L89154 Pressure ulcer of sacral region, stage 4: Secondary | ICD-10-CM | POA: Diagnosis present

## 2017-05-15 DIAGNOSIS — I5042 Chronic combined systolic (congestive) and diastolic (congestive) heart failure: Secondary | ICD-10-CM | POA: Diagnosis not present

## 2017-05-15 DIAGNOSIS — I481 Persistent atrial fibrillation: Secondary | ICD-10-CM | POA: Diagnosis present

## 2017-05-15 DIAGNOSIS — N4889 Other specified disorders of penis: Secondary | ICD-10-CM | POA: Diagnosis present

## 2017-05-15 DIAGNOSIS — J9611 Chronic respiratory failure with hypoxia: Secondary | ICD-10-CM | POA: Diagnosis present

## 2017-05-15 DIAGNOSIS — I959 Hypotension, unspecified: Secondary | ICD-10-CM | POA: Diagnosis not present

## 2017-05-15 DIAGNOSIS — N179 Acute kidney failure, unspecified: Secondary | ICD-10-CM | POA: Diagnosis present

## 2017-05-15 DIAGNOSIS — N401 Enlarged prostate with lower urinary tract symptoms: Secondary | ICD-10-CM | POA: Diagnosis present

## 2017-05-15 DIAGNOSIS — G92 Toxic encephalopathy: Secondary | ICD-10-CM | POA: Diagnosis present

## 2017-05-15 DIAGNOSIS — G4700 Insomnia, unspecified: Secondary | ICD-10-CM | POA: Diagnosis present

## 2017-05-15 DIAGNOSIS — Z66 Do not resuscitate: Secondary | ICD-10-CM | POA: Diagnosis present

## 2017-05-15 DIAGNOSIS — B9562 Methicillin resistant Staphylococcus aureus infection as the cause of diseases classified elsewhere: Secondary | ICD-10-CM | POA: Diagnosis present

## 2017-05-15 DIAGNOSIS — M4628 Osteomyelitis of vertebra, sacral and sacrococcygeal region: Secondary | ICD-10-CM | POA: Diagnosis present

## 2017-05-15 DIAGNOSIS — M869 Osteomyelitis, unspecified: Secondary | ICD-10-CM | POA: Diagnosis not present

## 2017-05-15 DIAGNOSIS — B951 Streptococcus, group B, as the cause of diseases classified elsewhere: Secondary | ICD-10-CM | POA: Diagnosis present

## 2017-05-15 DIAGNOSIS — I480 Paroxysmal atrial fibrillation: Secondary | ICD-10-CM | POA: Diagnosis present

## 2017-05-15 DIAGNOSIS — I110 Hypertensive heart disease with heart failure: Secondary | ICD-10-CM | POA: Diagnosis present

## 2017-05-15 DIAGNOSIS — I350 Nonrheumatic aortic (valve) stenosis: Secondary | ICD-10-CM | POA: Diagnosis present

## 2017-05-15 DIAGNOSIS — R4182 Altered mental status, unspecified: Secondary | ICD-10-CM | POA: Diagnosis not present

## 2017-05-15 DIAGNOSIS — K5939 Other megacolon: Secondary | ICD-10-CM | POA: Diagnosis not present

## 2017-05-15 DIAGNOSIS — I5043 Acute on chronic combined systolic (congestive) and diastolic (congestive) heart failure: Secondary | ICD-10-CM | POA: Diagnosis present

## 2017-05-15 DIAGNOSIS — I5033 Acute on chronic diastolic (congestive) heart failure: Secondary | ICD-10-CM | POA: Diagnosis not present

## 2017-05-15 DIAGNOSIS — I482 Chronic atrial fibrillation: Secondary | ICD-10-CM | POA: Diagnosis not present

## 2017-05-15 DIAGNOSIS — K219 Gastro-esophageal reflux disease without esophagitis: Secondary | ICD-10-CM | POA: Diagnosis present

## 2017-05-15 DIAGNOSIS — J449 Chronic obstructive pulmonary disease, unspecified: Secondary | ICD-10-CM | POA: Diagnosis present

## 2017-05-15 DIAGNOSIS — I452 Bifascicular block: Secondary | ICD-10-CM | POA: Diagnosis present

## 2017-05-15 DIAGNOSIS — J9612 Chronic respiratory failure with hypercapnia: Secondary | ICD-10-CM | POA: Diagnosis present

## 2017-05-15 DIAGNOSIS — H3530 Unspecified macular degeneration: Secondary | ICD-10-CM | POA: Diagnosis present

## 2017-05-15 LAB — CBC
HEMATOCRIT: 30.9 % — AB (ref 39.0–52.0)
HEMOGLOBIN: 9.3 g/dL — AB (ref 13.0–17.0)
MCH: 29.1 pg (ref 26.0–34.0)
MCHC: 30.1 g/dL (ref 30.0–36.0)
MCV: 96.6 fL (ref 78.0–100.0)
Platelets: 226 10*3/uL (ref 150–400)
RBC: 3.2 MIL/uL — AB (ref 4.22–5.81)
RDW: 15.5 % (ref 11.5–15.5)
WBC: 5.3 10*3/uL (ref 4.0–10.5)

## 2017-05-15 LAB — BASIC METABOLIC PANEL
ANION GAP: 5 (ref 5–15)
BUN: 36 mg/dL — ABNORMAL HIGH (ref 6–20)
CO2: 31 mmol/L (ref 22–32)
Calcium: 8.2 mg/dL — ABNORMAL LOW (ref 8.9–10.3)
Chloride: 101 mmol/L (ref 101–111)
Creatinine, Ser: 1.3 mg/dL — ABNORMAL HIGH (ref 0.61–1.24)
GFR calc Af Amer: 55 mL/min — ABNORMAL LOW (ref >60)
GFR, EST NON AFRICAN AMERICAN: 47 mL/min — AB (ref >60)
Glucose, Bld: 98 mg/dL (ref 65–99)
POTASSIUM: 4 mmol/L (ref 3.5–5.1)
SODIUM: 137 mmol/L (ref 135–145)

## 2017-05-15 LAB — URINALYSIS, ROUTINE W REFLEX MICROSCOPIC
Bilirubin Urine: NEGATIVE
GLUCOSE, UA: NEGATIVE mg/dL
HGB URINE DIPSTICK: NEGATIVE
Ketones, ur: NEGATIVE mg/dL
NITRITE: NEGATIVE
PH: 5 (ref 5.0–8.0)
Protein, ur: 100 mg/dL — AB
Specific Gravity, Urine: 1.016 (ref 1.005–1.030)

## 2017-05-15 LAB — VANCOMYCIN, RANDOM: VANCOMYCIN RM: 23

## 2017-05-15 LAB — VITAMIN B12: Vitamin B-12: 508 pg/mL (ref 180–914)

## 2017-05-15 MED ORDER — SODIUM CHLORIDE 0.9% FLUSH
10.0000 mL | INTRAVENOUS | Status: DC | PRN
  Administered 2017-05-18: 10 mL
  Administered 2017-05-21: 20 mL
  Administered 2017-05-23: 10 mL
  Administered 2017-05-26: 20 mL
  Administered 2017-05-27 – 2017-05-28 (×3): 10 mL
  Filled 2017-05-15 (×7): qty 40

## 2017-05-15 MED ORDER — SODIUM CHLORIDE 0.9 % IV SOLN
INTRAVENOUS | Status: DC
  Administered 2017-05-15: 16:00:00 via INTRAVENOUS

## 2017-05-15 NOTE — Progress Notes (Signed)
[facility]  PROGRESS NOTE  [name] ZOX:[id number] DOB: [birth year] DOA: 05/14/2017 PCP: [name], MD   Brief HPI:   83 y.o. male with medical history of persistent atrial fibrillation, systolic and diastolic CHF, COPD, coccygeal osteomyelitis, hyperlipidemia presenting with fluctuating mental status since May 11, 2017.  The patient was recently discharged from the hospital after a stay from 02/07/17 through 02/21/17 when he was treated for sepsis secondary to sacral osteomyelitis.  A PICC line was placed, and the patient was discharged with antibiotics to finish on 03/22/2017.  His PICC line was subsequently removed after he finished his antibiotics.  He continued to receive wound care.  During the wound care visit on 04/20/2017, the patient was noted to have necrotic bone in his coccyx.  Bone biopsy and cultures were sent and were consistent with osteomyelitis.  Bone cultures grew group B streptococcus, diphtheroids, E. coli, and MRSA a PICC line was placed on 03/29/2017, the patient was started on vancomycin and ertapenem  With instructions to finish on 06/07/2017.  The patient was doing fairly well up until the last 3 days when his family noted that he has been having intermittent episodes of somnolence and difficulty to arouse.  In between these episodes, the patient will be fully lucid and able to speak with clarity.  On the morning of 05/14/2017, the patient was difficult to arouse, and was somnolent after he was awoken.  As result, the patient was transferred to the emergency department for further evaluation     Subjective   Patient is alert, communicating. Denies pain at this time. No shortness of breath.   Assessment/Plan:    1. Encephalopathy-seems to have resolved, patient is back to his baseline, communicating. Patient was started on Invanz, there is a concern for possible seizures so EEG has been ordered. UA is negative. Urine culture is pending. Ammonia 27,  B12 508, magnesium 2.0, Pro calcitonin less than .10, TSH 1.806. 2. Coccygeal osteomyelitis - patient had wound vac placed on Friday, and became lethargic on Saturday. Could have become transiently bacteremic. Continue invanz. 3. Permanent atrial fibrillation-heart rate is controlled, continue amiodarone, Xarelto.  4. Chronic systolic and diastolic CHF-at baseline, continue home dose of Bumex 5. Penile edema/? Paraphimosis-  called and discussed with urology, they will see the patient today. 6. COPD-continue Spiriva 7. Acute kidney injury-creatinine has jumped 1.30 with BUN  36. Will hold Bumex. Start gentle IV hydration with normal saline. 8. Hyperlipidemia-continue Statin.    DVT prophylaxis: Xarelto  Code Status: partial code, DNI  Family Communication: discussed with patient's daughter at bedside  Disposition Plan: to be decided   Consultants:  none  Procedures:  none  Continuous infusions . ertapenem [facility]) IV 1 g (05/15/17 1018)      Antibiotics:   Anti-infectives (From admission, onward)   Start     Dose/Rate Route Frequency Ordered Stop   05/15/17 0800  ertapenem (INVANZ) 1 g in sodium chloride 0.9 % 50 mL IVPB     1 g 100 mL/hr over 30 Minutes Intravenous Every 24 hours 05/14/17 2050     05/14/17 1715  ceFEPIme (MAXIPIME) 2 g in dextrose 5 % 50 mL IVPB     2 g 100 mL/hr over 30 Minutes Intravenous  Once 05/14/17 1701 05/14/17 1941   05/14/17 1715  vancomycin (VANCOCIN) 1,500 mg in sodium chloride 0.9 % 500 mL IVPB  Status:  Discontinued     1,500 mg 250 mL/hr over 120 Minutes Intravenous  Once  05/14/17 1706 05/14/17 1721       Objective   Vitals:   05/14/17 1930 05/14/17 2012 05/15/17 0535 05/15/17 1344  BP: (!) 106/53 (!) 95/48 (!) 94/44 (!) 95/44  Pulse: 68 70 67 70  Resp: (!) 30 (!) 25  18  Temp:  98.1 F (36.7 C) 99.3 F (37.4 C) 100.3 F (37.9 C)  TempSrc:  Oral Oral Axillary  SpO2: 93% 95% 95% 95%  Weight:      Height:         Intake/Output Summary (Last 24 hours) at 05/15/2017 1539 Last data filed at 05/15/2017 0539 Gross per 24 hour  Intake 50 ml  Output 400 ml  Net -350 ml   Filed Weights   05/14/17 1301  Weight: 90.7 kg (200 lb)     Physical Examination:   Physical Exam: Eyes: No icterus, extraocular muscles intact  Mouth: Oral mucosa is moist, no lesions on palate,  Neck: Supple, no deformities, masses, or tenderness Lungs: Normal respiratory effort, bilateral clear to auscultation, no crackles or wheezes.  Heart: Regular rate and rhythm, S1 and S2 normal, no murmurs, rubs auscultated Abdomen: BS normoactive,soft,nondistended,non-tender to palpation,no organomegaly Extremities: trace edema of the lower extremities GU-significant edema of the penis, involving the foreskin Neuro : Alert and oriented to time, place and person, No focal deficits       Data Reviewed: I have personally reviewed following labs and imaging studies  CBG: No results for input(s): GLUCAP in the last 168 hours.  CBC: Recent Labs  Lab 05/14/17 1316 05/15/17 0712  WBC 6.2 5.3  NEUTROABS 3.8  --   HGB 9.0* 9.3*  HCT 29.5* 30.9*  MCV 96.4 96.6  PLT 234 226    Basic Metabolic Panel: Recent Labs  Lab 05/14/17 1316 05/14/17 2105 05/15/17 0712  NA 137  --  137  K 4.2  --  4.0  CL 98*  --  101  CO2 33*  --  31  GLUCOSE 126*  --  98  BUN 31*  --  36*  CREATININE 0.96  --  1.30*  CALCIUM 8.7*  --  8.2*  MG  --  2.0  --     No results found for this or any previous visit (from the past 240 hour(s)).   Liver Function Tests: Recent Labs  Lab 05/14/17 1316  AST 30  ALT 20  ALKPHOS 80  BILITOT 0.6  PROT 5.7*  ALBUMIN 2.5*   No results for input(s): LIPASE, AMYLASE in the last 168 hours. Recent Labs  Lab 05/14/17 2105  AMMONIA 27       Studies: Dg Chest 2 View  Result Date: 05/14/2017 CLINICAL DATA:  83 year old male with a history of weakness. EXAM: CHEST  2 VIEW COMPARISON:   02/18/2017 FINDINGS: Cardiomediastinal silhouette likely unchanged with rightward rotation accentuating the mediastinal contour. Calcifications of the aortic arch and descending thoracic aorta. Right upper extremity PICC, appears to terminate superior vena cava. Low lung volumes compare to the prior. Patchy opacities bilateral lungs. Blunting of the costophrenic sulcus on the lateral view. IMPRESSION: Lateral view demonstrates evidence of small pleural effusions. Low lung volumes with patchy opacities potentially representing chronic changes, atelectasis, and/or consolidation. Right upper extremity PICC appears to terminate in the superior vena cava. Electronically Signed   By: Gilmer MorJaime  Wagner D.O.   On: 05/14/2017 14:15   Ct Head Wo Contrast  Result Date: 05/14/2017 CLINICAL DATA:  History of sepsis and altered level of consciousness EXAM: CT HEAD  WITHOUT CONTRAST TECHNIQUE: Contiguous axial images were obtained from the base of the skull through the vertex without intravenous contrast. COMPARISON:  None. FINDINGS: Brain: Mild atrophic changes and chronic white matter ischemic changes are seen. No findings to suggest acute hemorrhage, acute infarction or space-occupying mass lesion are noted. Vascular: No hyperdense vessel or unexpected calcification. Skull: Normal. Negative for fracture or focal lesion. Sinuses/Orbits: No acute finding. Other: None. IMPRESSION: Chronic atrophic and ischemic changes without acute abnormality. Electronically Signed   By: Alcide Clever M.D.   On: 05/14/2017 15:57    Scheduled Meds: . acetaminophen  1,000 mg Oral Q8H  . amiodarone  200 mg Oral Daily  . bumetanide  1 mg Oral BID  . docusate sodium  100 mg Oral BID  . ferrous sulfate  325 mg Oral TID WC  . loratadine  10 mg Oral Daily  . magnesium oxide  200 mg Oral Daily  . multivitamin with minerals  1 tablet Oral Daily  . nutrition supplement (JUVEN)  1 packet Oral BID BM  . polyethylene glycol  17 g Oral BID  .  potassium chloride SA  20 mEq Oral Daily  . protein supplement shake  11 oz Oral Q24H  . rivaroxaban  20 mg Oral Q2000  . tamsulosin  0.4 mg Oral Daily  . tiotropium  18 mcg Inhalation Daily      Time spent: 25 min  Meredeth Ide   [facility] Pager [phone]. If 7PM-7AM, please contact night-coverage at [email or web address], Office  [phone]  password [email or web address]  05/15/2017, 3:39 PM  LOS: 0 days

## 2017-05-15 NOTE — Consult Note (Addendum)
Urology Consult   Physician requesting consult: Mauro Kaufmann, MD  Reason for consult: Paraphimosis  History of Present Illness: [name] is a 83 y.o. male who is currently being treated for acute encephalopathy.  He has a history of sacral osteomyelitis and has been receiving vancomycin and ertapenem on and off since September.  Urology was consulted to evaluate the patient for paraphimosis.  The patient is A&O x3 and his daughter is at bedside to answer questions.  He currently denies penile discomfort.  He has a history of BPH with LUTS and currently has an indwelling Foley catheter in place that has not been exchanged since October 2018. The catheter was placed to prevent urine contamination of his sacral infection.  The patient states that he has urge incontinence at baseline and frequently urinates on himself 2/2 immobility and poor bladder sensation.  He denies a history  STDs, urolithiasis, GU malignancy/trauma/surgery.  He has a significant history of CHF with LE edema treated with bumex.      Past Medical History:  Diagnosis Date  . A-fib (HCC)   . Acute systolic CHF (congestive heart failure), NYHA class 3 -- Unclear etilogy (? Afib related) EF down from 60-70% to 40%. 07/05/2012   Echo 2/21: LV upper limits of normal. EF- 40%. Cannot assess Diastolic function - Aortic Sclerosis, Mod-Severe Left Atrial dilation; Mild RV & RA dilation; Moderately elevated PA peak pressure: 43mm Hg     . Arthritis    knees  . Asthma   . ASTHMA 02/06/2007   Qualifier: Diagnosis of  By: Claiborne Billings CMA, Terance Ice    . BENIGN PROSTATIC HYPERTROPHY 05/13/2010   Qualifier: Diagnosis of  By: Amador Cunas  MD, Janett Labella   . Bifascicular block 12/18/2013  . BPH (benign prostatic hypertrophy)   . Bronchospasm, exercise-induced   . CHF (congestive heart failure) (HCC)    systolic, EF 40% (07/07/2012)  . Chronic kidney disease    stabilized, due to infection  . Chronic rhinitis 06/02/2009   Qualifier: Diagnosis  of  By: Lovell Sheehan MD, Balinda Quails   . DJD (degenerative joint disease), lumbar 01/18/2012  . Dyslipidemia   . Edema    Bilateral - left lower leg greater than right- wears compression stockings  . General weakness 07/05/2012  . GERD (gastroesophageal reflux disease)   . History of elevated glucose 10/01/2009   Qualifier: Diagnosis of  By: Lovell Sheehan MD, Balinda Quails   . Hx of colonic polyps 11/02/2011  . Hypertension   . INSOMNIA, CHRONIC, MILD 01/02/2008   Qualifier: Diagnosis of  By: Lovell Sheehan MD, Balinda Quails   . Macular degeneration 08-06-13   bilateral -sight impaired  . Obesity   . Paroxysmal Atrial fibrillation - admitted with RVR 01/18/2012  . S/P left THA, AA 08/13/2013  . Shortness of breath     Past Surgical History:  Procedure Laterality Date  . CARDIOVERSION N/A 07/07/2012   Procedure: CARDIOVERSION;  Surgeon: Chrystie Nose, MD;  Location[facility] ENDOSCOPY;  Service: Cardiovascular;  Laterality: N/A;  . CARDIOVERSION N/A 08/30/2012   Procedure: CARDIOVERSION;  Surgeon: Chrystie Nose, MD;  Location: [facility] ENDOSCOPY;  Service: Cardiovascular;  Laterality: N/A;  . IR FLUORO GUIDE CV LINE RIGHT  04/15/2017  . IR US GUIDE VASC ACCESS RIGHT  04/15/2017  . IRRIGATION AND DEBRIDEMENT ABSCESS N/A 02/08/2017   Procedure: IRRIGATION AND DEBRIDEMENT SACRAL ULCER;  Surgeon: Berna Bue, MD;  Location: [facility] ORS;  Service: General;  Laterality: N/A;  . TOTAL HIP ARTHROPLASTY Left 08/13/2013  Procedure: LEFT TOTAL HIP ARTHROPLASTY ANTERIOR APPROACH;  Surgeon: [name], MD;  Location: [facility] ORS;  Service: Orthopedics;  Laterality: Left;  . TOTAL KNEE ARTHROPLASTY Left 01/24/2017   Procedure: LEFT TOTAL KNEE ARTHROPLASTY;  Surgeon: Durene Romans, MD;  Location: [facility] ORS;  Service: Orthopedics;  Laterality: Left;  90 mins  . TRANSTHORACIC ECHOCARDIOGRAM  07/07/2012   ef 40%; mild MR; LA mod-severely dilated; RA mildly dilated; RV systolic function mildly reduced; RV systolic pressure increase - mod pulm htn  . VASCULAR  SURGERY  removed varicose veins  . VASECTOMY      Current Hospital Medications:  Home Meds:  Current Meds  Medication Sig  . acetaminophen (TYLENOL) 500 MG tablet Take 2 tablets (1,000 mg total) by mouth every 8 (eight) hours.  [name] albuterol (PROVENTIL HFA;VENTOLIN HFA) 108 (90 Base) MCG/ACT inhaler Inhale 2 puffs into the lungs. Every 6 hours as needed for wheezing or shortness of breath  . amiodarone (PACERONE) 200 MG tablet TAKE ONE TABLET BY MOUTH ONCE DAILY  . bumetanide (BUMEX) 1 MG tablet Take 1 tablet (1 mg total) by mouth 2 (two) times daily.  . cetirizine (ZYRTEC) 10 MG tablet Take 10 mg by mouth daily as needed for allergies.  [name] docusate sodium 100 MG CAPS Take 100 mg by mouth 2 (two) times daily.  . ertapenem 1 g in sodium chloride 0.9 % 50 mL Inject 1 g into the vein.  . famotidine (PEPCID) 20 MG tablet Take 1 tablet (20 mg total) by mouth 2 (two) times daily.  . ferrous sulfate (FERROUSUL) 325 (65 FE) MG tablet Take 1 tablet (325 mg total) by mouth 3 (three) times daily with meals.  . gabapentin (NEURONTIN) 400 MG capsule TAKE 1 CAPSULE BY MOUTH THREE TIMES DAILY  . KLOR-CON M20 20 MEQ tablet TAKE ONE TABLET BY MOUTH ONCE DAILY  . Magnesium 250 MG TABS Take 250 mg by mouth daily.   . Melatonin 3 MG CAPS Take 3 mg by mouth as needed (insomnia).  . midodrine (PROAMATINE) 10 MG tablet TAKE ONE TABLET BY MOUTH ONCE DAILY AS NEEDED FOR  SYSTOLIC  BLOOD  PRESSURE  (TOP  NUMBER)  UNDER  90  . Multiple Vitamin (MULTIVITAMIN WITH MINERALS) TABS tablet Take 1 tablet by mouth daily.  . Multiple Vitamins-Minerals (ICAPS AREDS FORMULA PO) Take 1 capsule by mouth 2 (two) times daily.   . nutrition supplement, JUVEN, (JUVEN) PACK Take 1 packet by mouth 2 (two) times daily between meals.  . polyethylene glycol (MIRALAX / GLYCOLAX) packet Take 17 g by mouth 2 (two) times daily.  . protein supplement shake (PREMIER PROTEIN) LIQD Take 325 mLs (11 oz total) by mouth daily.  . rivaroxaban  (XARELTO) 20 MG TABS tablet Take 1 tablet (20 mg total) by mouth daily.  [name] SPIRIVA HANDIHALER 18 MCG inhalation capsule INHALE ONE PUFF BY MOUTH ONCE DAILY  . tamsulosin (FLOMAX) 0.4 MG CAPS capsule TAKE 1 CAPSULE BY MOUTH ONCE DAILY  . traMADol (ULTRAM) 50 MG tablet Take 1-2 tablets (50-100 mg total) by mouth every 12 (twelve) hours as needed for moderate pain or severe pain (and before hydrotherapy).  . vancomycin IVPB Inject 1,000 mg into the vein every 12 (twelve) hours.   . [DISCONTINUED] Calcium Carbonate-Vitamin D (CALCIUM 600+D) 600-400 MG-UNIT per tablet Take 1 tablet by mouth 3 (three) times a week. Monday Wednesday and Friday    Scheduled Meds: . acetaminophen  1,000 mg Oral Q8H  . amiodarone  200 mg Oral Daily  .  docusate sodium  100 mg Oral BID  . ferrous sulfate  325 mg Oral TID WC  . loratadine  10 mg Oral Daily  . magnesium oxide  200 mg Oral Daily  . multivitamin with minerals  1 tablet Oral Daily  . nutrition supplement (JUVEN)  1 packet Oral BID BM  . polyethylene glycol  17 g Oral BID  . potassium chloride SA  20 mEq Oral Daily  . protein supplement shake  11 oz Oral Q24H  . rivaroxaban  20 mg Oral Q2000  . tamsulosin  0.4 mg Oral Daily  . tiotropium  18 mcg Inhalation Daily   Continuous Infusions: . ertapenem (INVANZ) IV 1 g (05/15/17 1018)   PRN Meds:.albuterol, midodrine, ondansetron **OR** ondansetron (ZOFRAN) IV, sodium chloride flush, traMADol  Allergies:  Allergies  Allergen Reactions  . Nsaids     Stomach pain  . Vicodin [Hydrocodone-Acetaminophen] Other (See Comments)    Unknown reaction many years ago  . Morphine And Related Nausea And Vomiting    Dizziness, light headed. "Opiates cause tightness in chest".    Family History  Problem Relation Age of Onset  . Alzheimer's disease Mother   . Heart disease Father     Social History:  reports that he quit smoking about 53 years ago. he has never used smokeless tobacco. He reports that he does not  drink alcohol or use drugs.  ROS: A complete review of systems was performed.  All systems are negative except for pertinent findings as noted.  Physical Exam:  Vital signs in last 24 hours: Temp:  [98.1 F (36.7 C)-100.3 F (37.9 C)] 100.3 F (37.9 C) (12/30 1344) Pulse Rate:  [64-71] 70 (12/30 1344) Resp:  [18-33] 18 (12/30 1344) BP: (94-107)/(44-58) 95/44 (12/30 1344) SpO2:  [93 %-97 %] 95 % (12/30 1344) Constitutional:  Alert and oriented, No acute distress Cardiovascular: Regular rate and rhythm, No JVD Respiratory: Normal respiratory effort, Lungs clear bilaterally GI: Abdomen is soft, nontender, nondistended, no abdominal masses GU: No CVA tenderness, The penis is uncircumcised with the foreskin pulled over the glans.  There is a mild to moderate amount of penile edema, but no signs of paraphimosis.  The glans is erythematous with exudate vs smegma surrounding it.  The scrotum is also edematous with both testicles descended with no signs of infection on palpation  Lymphatic: No lymphadenopathy Neurologic: Grossly intact, no focal deficits Psychiatric: Normal mood and affect  Laboratory Data:  Recent Labs    05/14/17 1316 05/15/17 0712  WBC 6.2 5.3  HGB 9.0* 9.3*  HCT 29.5* 30.9*  PLT 234 226    Recent Labs    05/14/17 1316 05/15/17 0712  NA 137 137  K 4.2 4.0  CL 98* 101  GLUCOSE 126* 98  BUN 31* 36*  CALCIUM 8.7* 8.2*  CREATININE 0.96 1.30*     Results for orders placed or performed during the hospital encounter of 05/14/17 (from the past 24 hour(s))  Vancomycin, trough     Status: Abnormal   Collection Time: 05/14/17  5:20 PM  Result Value Ref Range   Vancomycin Tr 28 (HH) 15 - 20 ug/mL  TSH     Status: None   Collection Time: 05/14/17  9:05 PM  Result Value Ref Range   TSH 1.806 0.350 - 4.500 uIU/mL  Vitamin B12     Status: None   Collection Time: 05/14/17  9:05 PM  Result Value Ref Range   Vitamin B-12 508 180 - 914 pg/mL  Ammonia     Status:  None   Collection Time: 05/14/17  9:05 PM  Result Value Ref Range   Ammonia 27 9 - 35 umol/L  Magnesium     Status: None   Collection Time: 05/14/17  9:05 PM  Result Value Ref Range   Magnesium 2.0 1.7 - 2.4 mg/dL  Procalcitonin - Baseline     Status: None   Collection Time: 05/14/17  9:05 PM  Result Value Ref Range   Procalcitonin <0.10 ng/mL  Urinalysis, Routine w reflex microscopic     Status: Abnormal   Collection Time: 05/15/17  6:52 AM  Result Value Ref Range   Color, Urine YELLOW YELLOW   APPearance CLOUDY (A) CLEAR   Specific Gravity, Urine 1.016 1.005 - 1.030   pH 5.0 5.0 - 8.0   Glucose, UA NEGATIVE NEGATIVE mg/dL   Hgb urine dipstick NEGATIVE NEGATIVE   Bilirubin Urine NEGATIVE NEGATIVE   Ketones, ur NEGATIVE NEGATIVE mg/dL   Protein, ur 161 (A) NEGATIVE mg/dL   Nitrite NEGATIVE NEGATIVE   Leukocytes, UA LARGE (A) NEGATIVE   RBC / HPF 6-30 0 - 5 RBC/hpf   WBC, UA TOO NUMEROUS TO COUNT 0 - 5 WBC/hpf   Bacteria, UA MANY (A) NONE SEEN   Squamous Epithelial / LPF 0-5 (A) NONE SEEN   Budding Yeast PRESENT    Hyaline Casts, UA PRESENT   Vancomycin, random     Status: None   Collection Time: 05/15/17  7:12 AM  Result Value Ref Range   Vancomycin Rm 23   Basic metabolic panel     Status: Abnormal   Collection Time: 05/15/17  7:12 AM  Result Value Ref Range   Sodium 137 135 - 145 mmol/L   Potassium 4.0 3.5 - 5.1 mmol/L   Chloride 101 101 - 111 mmol/L   CO2 31 22 - 32 mmol/L   Glucose, Bld 98 65 - 99 mg/dL   BUN 36 (H) 6 - 20 mg/dL   Creatinine, Ser 0.96 (H) 0.61 - 1.24 mg/dL   Calcium 8.2 (L) 8.9 - 10.3 mg/dL   GFR calc non Af Amer 47 (L) >60 mL/min   GFR calc Af Amer 55 (L) >60 mL/min   Anion gap 5 5 - 15  CBC     Status: Abnormal   Collection Time: 05/15/17  7:12 AM  Result Value Ref Range   WBC 5.3 4.0 - 10.5 K/uL   RBC 3.20 (L) 4.22 - 5.81 MIL/uL   Hemoglobin 9.3 (L) 13.0 - 17.0 g/dL   HCT 04.5 (L) 40.9 - 81.1 %   MCV 96.6 78.0 - 100.0 fL   MCH 29.1  26.0 - 34.0 pg   MCHC 30.1 30.0 - 36.0 g/dL   RDW 91.4 78.2 - 95.6 %   Platelets 226 150 - 400 K/uL   No results found for this or any previous visit (from the past 240 hour(s)).  Renal Function: Recent Labs    05/14/17 1316 05/15/17 0712  CREATININE 0.96 1.30*   Estimated Creatinine Clearance: 42.9 mL/min (A) (by C-G formula based on SCr of 1.3 mg/dL (H)).  Radiologic Imaging: Dg Chest 2 View  Result Date: 05/14/2017 CLINICAL DATA:  83 year old male with a history of weakness. EXAM: CHEST  2 VIEW COMPARISON:  02/18/2017 FINDINGS: Cardiomediastinal silhouette likely unchanged with rightward rotation accentuating the mediastinal contour. Calcifications of the aortic arch and descending thoracic aorta. Right upper extremity PICC, appears to terminate superior vena cava. Low lung volumes compare to  the prior. Patchy opacities bilateral lungs. Blunting of the costophrenic sulcus on the lateral view. IMPRESSION: Lateral view demonstrates evidence of small pleural effusions. Low lung volumes with patchy opacities potentially representing chronic changes, atelectasis, and/or consolidation. Right upper extremity PICC appears to terminate in the superior vena cava. Electronically Signed   By: Gilmer MorJaime  Wagner D.O.   On: 05/14/2017 14:15   Ct Head Wo Contrast  Result Date: 05/14/2017 CLINICAL DATA:  History of sepsis and altered level of consciousness EXAM: CT HEAD WITHOUT CONTRAST TECHNIQUE: Contiguous axial images were obtained from the base of the skull through the vertex without intravenous contrast. COMPARISON:  None. FINDINGS: Brain: Mild atrophic changes and chronic white matter ischemic changes are seen. No findings to suggest acute hemorrhage, acute infarction or space-occupying mass lesion are noted. Vascular: No hyperdense vessel or unexpected calcification. Skull: Normal. Negative for fracture or focal lesion. Sinuses/Orbits: No acute finding. Other: None. IMPRESSION: Chronic atrophic and  ischemic changes without acute abnormality. Electronically Signed   By: Alcide CleverMark  Lukens M.D.   On: 05/14/2017 15:57    I independently reviewed the above imaging studies.  Impression/Recommendation: 1.  Peno-scrotal edema-  Likely secondary to volume overload.  No evidence of paraphimosis on exam.  Recommend penoscrotal elevation and ambulation as tolerated.  Discussed foreskin care with nursing staff.  Currently on Bumex  2.  Acute encephalopathy- Improving.  Urine culture pending.  Currently on vanc and ertapenem for a sacral osteomyelelitis.  Foley catheter exchanged (needs to be exchanged monthly). Primary team working up other potential sources of encephalopathy.    Rhoderick Moodyhristopher Asya Derryberry, MD [facility] 05/15/2017, 3:54 PM

## 2017-05-15 NOTE — Progress Notes (Signed)
Pharmacy Antibiotic Note  [name] is a 83 y.o. male admitted on 05/14/2017 with sepsis/osteomyelitis.  Pharmacy has been consulted for vancomycin dosing.  Plan:  Pt was on a regimen of vancomycin 1000 mg IV q12h PTA  Obtained trough on 12/29 which was 28 mcg/ml  Repeat vancomycin this AM, about 12 hours after last one was 23 mcg/ml  Hold vancomycin for now  Will obtain another trough in 24 hours   Height: 5\' 8"  (172.7 cm) Weight: 200 lb (90.7 kg) IBW/kg (Calculated) : 68.4  Temp (24hrs), Avg:99.2 F (37.3 C), Min:98 F (36.7 C), Max:100.2 F (37.9 C)  Recent Labs  Lab 05/14/17 1316 05/14/17 1332 05/14/17 1457 05/14/17 1506 05/14/17 1720 05/15/17 0712  WBC 6.2  --   --   --   --  5.3  CREATININE 0.96  --   --   --   --  1.30*  LATICACIDVEN  --  0.36* 0.5 0.32*  --   --   VANCOTROUGH  --   --   --   --  28*  --   VANCORANDOM  --   --   --   --   --  23    Estimated Creatinine Clearance: 42.9 mL/min (A) (by C-G formula based on SCr of 1.3 mg/dL (H)).    Allergies  Allergen Reactions  . Nsaids     Stomach pain  . Vicodin [Hydrocodone-Acetaminophen] Other (See Comments)    Unknown reaction many years ago  . Morphine And Related Nausea And Vomiting    Dizziness, light headed. "Opiates cause tightness in chest".    Antimicrobials this admission:  Dose adjustments this admission:   Microbiology results:   Thank you for allowing pharmacy to be a part of this patient's care.   Adalberto Cole, PharmD, BCPS Pager [phone] 05/15/2017 12:50 PM

## 2017-05-16 ENCOUNTER — Other Ambulatory Visit: Payer: Medicare Other

## 2017-05-16 ENCOUNTER — Inpatient Hospital Stay (HOSPITAL_COMMUNITY)
Admit: 2017-05-16 | Discharge: 2017-05-16 | Disposition: A | Discharge status: Home / Self Care | Payer: Medicare Other | Attending: Internal Medicine | Admitting: Internal Medicine

## 2017-05-16 DIAGNOSIS — I959 Hypotension, unspecified: Secondary | ICD-10-CM

## 2017-05-16 LAB — URINE CULTURE: CULTURE: NO GROWTH

## 2017-05-16 LAB — MRSA PCR SCREENING: MRSA BY PCR: NEGATIVE

## 2017-05-16 LAB — BASIC METABOLIC PANEL
ANION GAP: 4 — AB (ref 5–15)
BUN: 43 mg/dL — ABNORMAL HIGH (ref 6–20)
CALCIUM: 8.5 mg/dL — AB (ref 8.9–10.3)
CO2: 33 mmol/L — ABNORMAL HIGH (ref 22–32)
Chloride: 102 mmol/L (ref 101–111)
Creatinine, Ser: 1.19 mg/dL (ref 0.61–1.24)
GFR, EST NON AFRICAN AMERICAN: 53 mL/min — AB (ref >60)
GLUCOSE: 106 mg/dL — AB (ref 65–99)
POTASSIUM: 4.2 mmol/L (ref 3.5–5.1)
Sodium: 139 mmol/L (ref 135–145)

## 2017-05-16 LAB — VANCOMYCIN, RANDOM: VANCOMYCIN RM: 18

## 2017-05-16 MED ORDER — ENSURE ENLIVE PO LIQD
237.0000 mL | Freq: Three times a day (TID) | ORAL | Status: DC
  Administered 2017-05-16 – 2017-05-28 (×31): 237 mL via ORAL

## 2017-05-16 MED ORDER — VANCOMYCIN HCL IN DEXTROSE 1-5 GM/200ML-% IV SOLN
1000.0000 mg | INTRAVENOUS | Status: DC
  Administered 2017-05-16 – 2017-05-20 (×5): 1000 mg via INTRAVENOUS
  Filled 2017-05-16 (×5): qty 200

## 2017-05-16 NOTE — Progress Notes (Signed)
Initial Nutrition Assessment  DOCUMENTATION CODES:   Obesity unspecified  INTERVENTION:   Provide Ensure Enlive po BID, each supplement provides 350 kcal and 20 grams of protein  Recommend Vitamin C and Zinc supplementation.  Vitamin C: 500 mg BID Zinc: 150 mg for 2 weeks (Longer supplementation may lead to copper deficiency)  RD will continue to monitor  NUTRITION DIAGNOSIS:   Increased nutrient needs related to wound healing as evidenced by estimated needs.  GOAL:   Patient will meet greater than or equal to 90% of their needs  MONITOR:   PO intake, Supplement acceptance, Weight trends, Labs, I & O's, Skin  REASON FOR ASSESSMENT:   Consult Wound healing  ASSESSMENT:   83 y.o. male with medical history of persistent atrial fibrillation, systolic and diastolic CHF, COPD, coccygeal osteomyelitis, hyperlipidemia presenting with fluctuating mental status since May 11, 2017.  The patient was recently discharged from the hospital after a stay from 02/07/17 through 02/21/17 when he was treated for sepsis secondary to sacral osteomyelitis.  A PICC line was placed, and the patient was discharged with antibiotics to finish on 03/22/2017.   Pt in room with no family at bedside. Pt with Ensure supplements at bedside [employer] and Juven ordered). Will switch Premier Protein order to Ensure supplements. Juven is not appropriate given pt's poor intake. Will d/c order. Pt would benefit from Vitamin C and Zinc supplementation. Recommendations provided above.   Per chart review, pt has gained weight. Potentially fluid realted given history of CHF. Weighed 203 lb on 10/8.  Labs reviewed. Medications: Colace capsule BID, ferrous sulfate TID, MAG-OX tablet daily, Multivitamin with minerals daily, Miralax packet BID, K-DUR tablet daily   NUTRITION - FOCUSED PHYSICAL EXAM:    Most Recent Value  Orbital Region  No depletion  Upper Arm Region  No depletion  Thoracic and Lumbar  Region  Unable to assess  Buccal Region  No depletion  Temple Region  Moderate depletion  Clavicle Bone Region  No depletion  Clavicle and Acromion Bone Region  No depletion  Scapular Bone Region  Unable to assess  Dorsal Hand  No depletion  Patellar Region  Unable to assess  Anterior Thigh Region  Unable to assess  Posterior Calf Region  Unable to assess  Edema (RD Assessment)  Mild       Diet Order:  Diet Heart Room service appropriate? Yes; Fluid consistency: Thin  EDUCATION NEEDS:   No education needs have been identified at this time  Skin:  Skin Assessment: Skin Integrity Issues: Skin Integrity Issues:: Stage IV Stage IV: sacrum  Last BM:  12/29  Height:   Ht Readings from Last 1 Encounters:  05/14/17 5\' 8"  (1.727 m)    Weight:   Wt Readings from Last 1 Encounters:  05/16/17 212 lb 1.3 oz (96.2 kg)    Ideal Body Weight:  70 kg  BMI:  Body mass index is 32.25 kg/m.  Estimated Nutritional Needs:   Kcal:  2200-2400  Protein:  105-115g  Fluid:  2.2L/day  Tilda Franco, MS, RD, LDN [facility] Inpatient Clinical Dietitian Pager: [phone] After Hours Pager: [phone]

## 2017-05-16 NOTE — Procedures (Signed)
[facility] Arleth Mccullar A. Gerilyn Pilgrimoonquah, MD     [email or web address]           HISTORY: The patient is 83 year old presents with a flexed with levels of arousal and confusion.  The study is being done to evaluate for seizures.  MEDICATIONS: Scheduled Meds: Continuous Infusions: PRN Meds:.  Prior to Admission medications   Medication Sig Start Date End Date Taking? Authorizing Provider  acetaminophen (TYLENOL) 500 MG tablet Take 2 tablets (1,000 mg total) by mouth every 8 (eight) hours. 01/24/17   Lanney GinsBabish, Matthew, [name]  albuterol (PROVENTIL HFA;VENTOLIN HFA) 108 (90 Base) MCG/ACT inhaler Inhale 2 puffs into the lungs. Every 6 hours as needed for wheezing or shortness of breath    [provider]  amiodarone (PACERONE) 200 MG tablet TAKE ONE TABLET BY MOUTH ONCE DAILY 10/29/16   Hilty, Lisette AbuKenneth C, MD  bumetanide (BUMEX) 1 MG tablet Take 1 tablet (1 mg total) by mouth 2 (two) times daily. 10/15/16   Hilty, Lisette AbuKenneth C, MD  cetirizine (ZYRTEC) 10 MG tablet Take 10 mg by mouth daily as needed for allergies.    [provider]  docusate sodium 100 MG CAPS Take 100 mg by mouth 2 (two) times daily. 08/15/13   Lanney GinsBabish, Matthew, [name]  ertapenem 1 g in sodium chloride 0.9 % 50 mL Inject 1 g into the vein. 04/26/17 06/07/17  [provider]  famotidine (PEPCID) 20 MG tablet Take 1 tablet (20 mg total) by mouth 2 (two) times daily. 02/21/17   Tyrone NineGrunz, Ryan B, MD  ferrous sulfate (FERROUSUL) 325 (65 FE) MG tablet Take 1 tablet (325 mg total) by mouth 3 (three) times daily with meals. 01/24/17   Lanney GinsBabish, Matthew, [name]  gabapentin (NEURONTIN) 400 MG capsule TAKE 1 CAPSULE BY MOUTH THREE TIMES DAILY 01/14/17   Gordy SaversKwiatkowski, Peter F, MD  KLOR-CON M20 20 MEQ tablet TAKE ONE TABLET BY MOUTH ONCE DAILY 11/12/16   Gordy SaversKwiatkowski, Peter F, MD  Magnesium 250 MG TABS Take 250 mg by mouth daily.     [provider]  Melatonin 3 MG CAPS Take 3 mg by mouth as needed (insomnia).    [provider]  midodrine (PROAMATINE) 10 MG tablet TAKE ONE TABLET BY MOUTH ONCE DAILY AS NEEDED FOR  SYSTOLIC  BLOOD  PRESSURE  (TOP  NUMBER)  UNDER  90 08/05/16   Hilty, Lisette AbuKenneth C, MD  Multiple Vitamin (MULTIVITAMIN WITH MINERALS) TABS tablet Take 1 tablet by mouth daily.    [provider]  Multiple Vitamins-Minerals (ICAPS AREDS FORMULA PO) Take 1 capsule by mouth 2 (two) times daily.     [provider]  nutrition supplement, JUVEN, (JUVEN) PACK Take 1 packet by mouth 2 (two) times daily between meals. 02/21/17   Tyrone NineGrunz, Ryan B, MD  polyethylene glycol (MIRALAX / Ethelene HalGLYCOLAX) packet Take 17 g by mouth 2 (two) times daily. 01/24/17   Lanney GinsBabish, Matthew, [name]  protein supplement shake (PREMIER PROTEIN) LIQD Take 325 mLs (11 oz total) by mouth daily. 02/21/17   Tyrone NineGrunz, Ryan B, MD  rivaroxaban (XARELTO) 20 MG TABS tablet Take 1 tablet (20 mg total) by mouth daily. 08/05/15   Hilty, Lisette AbuKenneth C, MD  rosuvastatin (CRESTOR) 20 MG tablet TAKE ONE TABLET BY MOUTH ONCE A WEEK, FRIDAY Patient not taking: Reported on 05/14/2017 12/24/16   Chrystie NoseHilty, Kenneth C, MD  SPIRIVA HANDIHALER 18 MCG inhalation capsule INHALE ONE PUFF BY MOUTH ONCE DAILY 03/18/16   Gordy SaversKwiatkowski, Peter F, MD  tamsulosin (FLOMAX) 0.4 MG CAPS capsule TAKE  1 CAPSULE BY MOUTH ONCE DAILY 12/24/16   [name], MD  traMADol (ULTRAM) 50 MG tablet Take 1-2 tablets (50-100 mg total) by mouth every 12 (twelve) hours as needed for moderate pain or severe pain (and before hydrotherapy). 02/21/17   Tyrone Nine, MD  vancomycin IVPB Inject 1,000 mg into the vein every 12 (twelve) hours.  04/26/17 06/07/17  [provider]      ANALYSIS: A 16 channel recording using standard 10 20 measurements is conducted for 23 minutes.   There is a well-formed posterior dominant rhythm that gets as high as   6.5-7 Hertz.  Beta activity is observed in the frontal areas.  Awake and drowsy activities are observed.  Photic stimulation and  hyperventilation were not carried out.  There is no focal or lateralized slowing.  There is no epileptiform activity is observed.  There is occasional frontal intermittent rhythmic delta activity is observed.   IMPRESSION: 1.  Mild to moderate global slowing indicating a mild-to-moderate global encephalopathy.    2. Occasional frontal intermittent rhythmic delta activity which is typically seen in toxic metabolic processes.      Isella Slatten A. Gerilyn Pilgrim, M.D.  Diplomate, Biomedical engineer of Psychiatry and Neurology ( Neurology).

## 2017-05-16 NOTE — Progress Notes (Signed)
[facility]  PROGRESS NOTE  [name] LGX:[id number] DOB: 02-22-1927 DOA: 05/14/2017 PCP: [name], MD   Brief HPI:   83 y.o. male with medical history of persistent atrial fibrillation, systolic and diastolic CHF, COPD, coccygeal osteomyelitis, hyperlipidemia presenting with fluctuating mental status since May 11, 2017.  The patient was recently discharged from the hospital after a stay from 02/07/17 through 02/21/17 when he was treated for sepsis secondary to sacral osteomyelitis.  A PICC line was placed, and the patient was discharged with antibiotics to finish on 03/22/2017.  His PICC line was subsequently removed after he finished his antibiotics.  He continued to receive wound care.  During the wound care visit on 04/20/2017, the patient was noted to have necrotic bone in his coccyx.  Bone biopsy and cultures were sent and were consistent with osteomyelitis.  Bone cultures grew group B streptococcus, diphtheroids, E. coli, and MRSA a PICC line was placed on 03/29/2017, the patient was started on vancomycin and ertapenem  With instructions to finish on 06/07/2017.  The patient was doing fairly well up until the last 3 days when his family noted that he has been having intermittent episodes of somnolence and difficulty to arouse.  In between these episodes, the patient will be fully lucid and able to speak with clarity.  On the morning of 05/14/2017, the patient was difficult to arouse, and was somnolent after he was awoken.  As result, the patient was transferred to the emergency department for further evaluation     Subjective   Patient seen and examined, denies chest pain or shortness of breath. Mental status back to baseline.   Assessment/Plan:    1. Encephalopathy-seems to have resolved, patient is back to his baseline, communicating. Patient was started on Invanz, there is a concern for possible seizures so EEG has been ordered. And is currently pending UA is  negative. Urine culture is pending. Ammonia 27, B12 508, magnesium 2.0, Pro calcitonin less than .10, TSH 1.806. 2. Coccygeal osteomyelitis - patient had wound vac placed on Friday, and became lethargic on Saturday. Could have become transiently bacteremic. Continue invanz. Follow blood culture results 3. Permanent atrial fibrillation-heart rate is controlled, continue amiodarone, Xarelto.  4. Chronic systolic and diastolic CHF-at baseline, Bumex on hold 5. Penile edema- improved, was seen by urology yesterday and Foley catheter was replaced. 6. COPD-continue Spiriva 7. Acute kidney injury-resolved, today creatinine is 1.19 creatinine has jumped 1.30 with BUN  36. Bumex was held and patient started on gentle IV hydration with normal saline. Will change IV fluids to [name]. 8. Hyperlipidemia-continue Statin.    DVT prophylaxis: Xarelto  Code Status: partial code, DNI  Family Communication: discussed with patient's daughter at bedside  Disposition Plan: to be decided   Consultants:  none  Procedures:  none  Continuous infusions . sodium chloride 75 mL/hr at 05/15/17 1627  . ertapenem [facility]) IV Stopped (05/16/17 0932)  . vancomycin Stopped (05/16/17 1057)      Antibiotics:   Anti-infectives (From admission, onward)   Start     Dose/Rate Route Frequency Ordered Stop   05/16/17 0800  vancomycin (VANCOCIN) IVPB 1000 mg/200 mL premix     1,000 mg 200 mL/hr over 60 Minutes Intravenous Every 24 hours 05/16/17 0728     05/15/17 0800  ertapenem (INVANZ) 1 g in sodium chloride 0.9 % 50 mL IVPB     1 g 100 mL/hr over 30 Minutes Intravenous Every 24 hours 05/14/17 2050     05/14/17  1715  ceFEPIme (MAXIPIME) 2 g in dextrose 5 % 50 mL IVPB     2 g 100 mL/hr over 30 Minutes Intravenous  Once 05/14/17 1701 05/14/17 1941   05/14/17 1715  vancomycin (VANCOCIN) 1,500 mg in sodium chloride 0.9 % 500 mL IVPB  Status:  Discontinued     1,500 mg 250 mL/hr over 120 Minutes Intravenous  Once  05/14/17 1706 05/14/17 1721       Objective   Vitals:   05/15/17 1344 05/15/17 2030 05/16/17 0558 05/16/17 0724  BP: (!) 95/44 (!) 92/43 (!) 109/39   Pulse: 70 71 73   Resp: 18 20 16    Temp: 100.3 F (37.9 C) 99.7 F (37.6 C) 99.8 F (37.7 C)   TempSrc: Axillary Axillary Oral   SpO2: 95% 95% 95% 96%  Weight:   96.2 kg (212 lb 1.3 oz)   Height:        Intake/Output Summary (Last 24 hours) at 05/16/2017 1345 Last data filed at 05/16/2017 0455 Gross per 24 hour  Intake 1175 ml  Output -  Net 1175 ml   Filed Weights   05/14/17 1301 05/16/17 0558  Weight: 90.7 kg (200 lb) 96.2 kg (212 lb 1.3 oz)     Physical Examination:  Physical Exam: Eyes: No icterus, extraocular muscles intact  Mouth: Oral mucosa is moist, no lesions on palate,  Neck: Supple, no deformities, masses, or tenderness Lungs: Normal respiratory effort, bilateral clear to auscultation, no crackles or wheezes.  Heart: Regular rate and rhythm, S1 and S2 normal, no murmurs, rubs auscultated Abdomen: BS normoactive,soft,nondistended,non-tender to palpation,no organomegaly Extremities: No pretibial edema, no erythema, no cyanosis, no clubbing Neuro : Alert and oriented to time, place and person, No focal deficits Skin: No rashes seen on exam     Data Reviewed: I have personally reviewed following labs and imaging studies  CBG: No results for input(s): GLUCAP in the last 168 hours.  CBC: Recent Labs  Lab 05/14/17 1316 05/15/17 0712  WBC 6.2 5.3  NEUTROABS 3.8  --   HGB 9.0* 9.3*  HCT 29.5* 30.9*  MCV 96.4 96.6  PLT 234 226    Basic Metabolic Panel: Recent Labs  Lab 05/14/17 1316 05/14/17 2105 05/15/17 0712 05/16/17 0539  NA 137  --  137 139  K 4.2  --  4.0 4.2  CL 98*  --  101 102  CO2 33*  --  31 33*  GLUCOSE 126*  --  98 106*  BUN 31*  --  36* 43*  CREATININE 0.96  --  1.30* 1.19  CALCIUM 8.7*  --  8.2* 8.5*  MG  --  2.0  --   --     Recent Results (from the past 240 hour(s))   Culture, blood (routine x 2) Call MD if unable to obtain prior to antibiotics being given     Status: None (Preliminary result)   Collection Time: 05/14/17  5:01 PM  Result Value Ref Range Status   Specimen Description BLOOD PICC LINE  Final   Special Requests   Final    BOTTLES DRAWN AEROBIC AND ANAEROBIC Blood Culture adequate volume   Culture   Final    NO GROWTH 1 DAY Performed at [facility], [address], [place], [place] [address]    Report Status PENDING  Incomplete  Culture, blood (routine x 2) Call MD if unable to obtain prior to antibiotics being given     Status: None (Preliminary result)  Collection Time: 05/14/17  5:06 PM  Result Value Ref Range Status   Specimen Description LEFT ANTECUBITAL  Final   Special Requests   Final    BOTTLES DRAWN AEROBIC AND ANAEROBIC Blood Culture adequate volume   Culture   Final    NO GROWTH 1 DAY Performed at [facility], [address] [place], [place] [address]    Report Status PENDING  Incomplete  Culture, Urine     Status: None   Collection Time: 05/15/17  6:52 AM  Result Value Ref Range Status   Specimen Description URINE, RANDOM  Final   Special Requests NONE  Final   Culture   Final    NO GROWTH Performed at [facility], [address] [place], [place] [address]    Report Status 05/16/2017 FINAL  Final  MRSA PCR Screening     Status: None   Collection Time: 05/16/17  2:15 AM  Result Value Ref Range Status   MRSA by PCR NEGATIVE NEGATIVE Final    Comment:        The GeneXpert MRSA Assay (FDA approved for NASAL specimens only), is one component of a comprehensive MRSA colonization surveillance program. It is not intended to diagnose MRSA infection nor to guide or monitor treatment for MRSA infections.      Liver Function Tests: Recent Labs  Lab 05/14/17 1316  AST 30  ALT 20  ALKPHOS 80  BILITOT 0.6  PROT 5.7*  ALBUMIN 2.5*   No results for input(s): LIPASE, AMYLASE in the last  168 hours. Recent Labs  Lab 05/14/17 2105  AMMONIA 27       Studies: Dg Chest 2 View  Result Date: 05/14/2017 CLINICAL DATA:  83 year old male with a history of weakness. EXAM: CHEST  2 VIEW COMPARISON:  02/18/2017 FINDINGS: Cardiomediastinal silhouette likely unchanged with rightward rotation accentuating the mediastinal contour. Calcifications of the aortic arch and descending thoracic aorta. Right upper extremity PICC, appears to terminate superior vena cava. Low lung volumes compare to the prior. Patchy opacities bilateral lungs. Blunting of the costophrenic sulcus on the lateral view. IMPRESSION: Lateral view demonstrates evidence of small pleural effusions. Low lung volumes with patchy opacities potentially representing chronic changes, atelectasis, and/or consolidation. Right upper extremity PICC appears to terminate in the superior vena cava. Electronically Signed   By: Gilmer Mor D.O.   On: 05/14/2017 14:15   Ct Head Wo Contrast  Result Date: 05/14/2017 CLINICAL DATA:  History of sepsis and altered level of consciousness EXAM: CT HEAD WITHOUT CONTRAST TECHNIQUE: Contiguous axial images were obtained from the base of the skull through the vertex without intravenous contrast. COMPARISON:  None. FINDINGS: Brain: Mild atrophic changes and chronic white matter ischemic changes are seen. No findings to suggest acute hemorrhage, acute infarction or space-occupying mass lesion are noted. Vascular: No hyperdense vessel or unexpected calcification. Skull: Normal. Negative for fracture or focal lesion. Sinuses/Orbits: No acute finding. Other: None. IMPRESSION: Chronic atrophic and ischemic changes without acute abnormality. Electronically Signed   By: Alcide Clever M.D.   On: 05/14/2017 15:57    Scheduled Meds: . acetaminophen  1,000 mg Oral Q8H  . amiodarone  200 mg Oral Daily  . docusate sodium  100 mg Oral BID  . ferrous sulfate  325 mg Oral TID WC  . loratadine  10 mg Oral Daily  .  magnesium oxide  200 mg Oral Daily  . multivitamin with minerals  1 tablet Oral Daily  . nutrition supplement (  JUVEN)  1 packet Oral BID BM  . polyethylene glycol  17 g Oral BID  . potassium chloride SA  20 mEq Oral Daily  . protein supplement shake  11 oz Oral Q24H  . rivaroxaban  20 mg Oral Q2000  . tamsulosin  0.4 mg Oral Daily  . tiotropium  18 mcg Inhalation Daily      Time spent: 25 min  Meredeth IdeGagan S Juniper Cobey   [facility] Pager [phone]. If 7PM-7AM, please contact night-coverage at [email or web address], Office  [phone]  password [email or web address]  05/16/2017, 1:45 PM  LOS: 1 day

## 2017-05-16 NOTE — Progress Notes (Signed)
Pharmacy Antibiotic Note  [name] is a 83 y.o. male admitted on 05/14/2017 with sepsis/osteomyelitis.  Pharmacy has been consulted for vancomycin dosing. Pt was on a regimen of vancomycin 1000 mg IV q12h PTA, trough was supratherapeutic.  05/16/2017 Random vanc=18 (LD 12/29 @ 0600) Scr 1.2, CrCl ~ 43.7 mls/min (N)  Plan:  Resume vancomycin at 1gm IV q24h  Continue to follow renal function and obtain vanc levels as needed   Height: 5\' 8"  (172.7 cm) Weight: 212 lb 1.3 oz (96.2 kg) IBW/kg (Calculated) : 68.4  Temp (24hrs), Avg:99.9 F (37.7 C), Min:99.7 F (37.6 C), Max:100.3 F (37.9 C)  Recent Labs  Lab 05/14/17 1316 05/14/17 1332 05/14/17 1457 05/14/17 1506 05/14/17 1720 05/15/17 0712 05/16/17 0539  WBC 6.2  --   --   --   --  5.3  --   CREATININE 0.96  --   --   --   --  1.30* 1.19  LATICACIDVEN  --  0.36* 0.5 0.32*  --   --   --   VANCOTROUGH  --   --   --   --  28*  --   --   VANCORANDOM  --   --   --   --   --  23 18    Estimated Creatinine Clearance: 48.2 mL/min (by C-G formula based on SCr of 1.19 mg/dL).    Allergies  Allergen Reactions  . Nsaids     Stomach pain  . Vicodin [Hydrocodone-Acetaminophen] Other (See Comments)    Unknown reaction many years ago  . Morphine And Related Nausea And Vomiting    Dizziness, light headed. "Opiates cause tightness in chest".     Thank you for allowing pharmacy to be a part of this patient's care.   Arley Phenix RPh 05/16/2017, 7:37 AM Pager [phone]

## 2017-05-16 NOTE — Clinical Social Work Note (Signed)
Clinical Social Work Assessment  Patient Details  Name: [name] MRN: [id number] Date of Birth: 06-21-1926  Date of referral:  05/16/17               Reason for consult:  Facility Placement, Discharge Planning                Permission sought to share information with:  Facility Medical sales representative, Family Supports Permission granted to share information::     Name::     [employer]  Agency::     Relationship::  Daughter  Contact Information:  [phone]  Housing/Transportation Living arrangements for the past 2 months:  Skilled Nursing Facility Source of Information:  Adult Children Patient Interpreter Needed:  None Criminal Activity/Legal Involvement Pertinent to Current Situation/Hospitalization:  No - Comment as needed Significant Relationships:  Adult Children Lives with:  Facility Resident Do you feel safe going back to the place where you live?  Yes Need for family participation in patient care:  Yes (Comment)  Care giving concerns:  Patient from [facility]. Patient has been at [facility] since October 8th for wound care and IV antibiotics. Patient's daughter reported that the goal is for patient to eventually return home after completing rehab at [facility].    Social Worker assessment / plan:  CSW spoke with patient's son and daughter/HCPOA at bedside regarding discharge planning, patient asleep. Patient's daughter reported that the plan is for patient to return to [facility] to complete rehab for wound care and IV antibiotics.   CSW contacted [facility] who confirmed patient's ability to return to SNF.  CSW started patient's insurance authorization with [employer].  CSW will continue to follow and assist with discharge planning.   Employment status:  Retired Database administrator PT Recommendations:  Not assessed at this time Information / Referral to community resources:  (Patient from SNF)  Patient/Family's Response to care:   Patient's family agreeable to patient returning to SNF for rehab to complete wound care and IV antibiotics. Patient's family appreciative of CSW assistance with discharge planning.   Patient/Family's Understanding of and Emotional Response to Diagnosis, Current Treatment, and Prognosis:  Patient asleep, patient's family reported that in the last few days patient has been lethargic and hard to arouse. Patient's family verbalized understanding of patient's current treatment and reported that patient is having multiple tests completed to figure the underlying cause of his symptoms. Patient's daughter hopeful that patient will progress and return home after completing rehab at [facility]. CSW and patient's son/daughter discussed patient's recent health issues and their desire for patient to get better. CSW provided active listening and emotional support.   Emotional Assessment Appearance:  Appears stated age Attitude/Demeanor/Rapport:  Lethargic Affect (typically observed):  Unable to Assess Orientation:  (Per patient's daughter, patient has been confused) Alcohol / Substance use:  Not Applicable Psych involvement (Current and /or in the community):  No (Comment)  Discharge Needs  Concerns to be addressed:  Care Coordination Readmission within the last 30 days:  No Current discharge risk:  None Barriers to Discharge:  Continued Medical Work up   [employer], LCSW 05/16/2017, 2:26 PM

## 2017-05-16 NOTE — Consult Note (Signed)
WOC Nurse wound consult note Reason for Consult: Sacral pressure injury, Stage 4. Patient known to me from a previous admission. Daughter is present for my assessment. Wound type:pressure Pressure Injury POA: Yes Measurement:6cm x 6.5cm x 3.8cm Wound bed:red, moist Drainage (amount, consistency, odor) small amount serosanguinous, no odor Periwound:intact, clear Dressing procedure/placement/frequency: Patient is incontinent of soft, formed  Stool.  Will implement a saline moistened gauze dressing performed twice daily and PRN incontinence. Heels are intact and will be floated; a mattress replacement with low air loss feature is provided. WOC nursing team will not follow, but will remain available to this patient, the nursing and medical teams.  Please re-consult if needed. Thanks, Ladona Mow, MSN, RN, GNP, [place]  Pager# [phone]

## 2017-05-16 NOTE — Progress Notes (Signed)
Offsite EEG completed, results pending. 

## 2017-05-16 NOTE — Progress Notes (Signed)
PT Cancellation Note  Patient Details Name: [name] MRN: [id number] DOB: 02-Aug-1926   Cancelled Treatment:    Reason Eval/Treat Not Completed: PT screened, no needs identified, will sign off(total care per RN/NT, from SNF/long term care)   [facility] 05/16/2017, 11:26 AM

## 2017-05-17 DIAGNOSIS — I9589 Other hypotension: Secondary | ICD-10-CM

## 2017-05-17 LAB — BASIC METABOLIC PANEL
Anion gap: 3 — ABNORMAL LOW (ref 5–15)
BUN: 30 mg/dL — AB (ref 6–20)
CALCIUM: 8.7 mg/dL — AB (ref 8.9–10.3)
CHLORIDE: 102 mmol/L (ref 101–111)
CO2: 34 mmol/L — ABNORMAL HIGH (ref 22–32)
CREATININE: 0.9 mg/dL (ref 0.61–1.24)
Glucose, Bld: 113 mg/dL — ABNORMAL HIGH (ref 65–99)
Potassium: 4.2 mmol/L (ref 3.5–5.1)
SODIUM: 139 mmol/L (ref 135–145)

## 2017-05-17 MED ORDER — BUMETANIDE 1 MG PO TABS
1.0000 mg | ORAL_TABLET | Freq: Two times a day (BID) | ORAL | Status: DC
  Administered 2017-05-17 – 2017-05-19 (×5): 1 mg via ORAL
  Filled 2017-05-17 (×6): qty 1

## 2017-05-17 NOTE — Progress Notes (Signed)
CSW following to assist patient with discharge back to [facility]. CSW started insurance authorization 05/16/2017, insurance authorization not received and insurance company not open today.   CSW contacted [facility] and provided update to staff member Rosey Bath. Staff reported that they are unable to admit patient back today without insurance authorization. Staff reported that they are unable to accept patient with private pay because staff that handles private pay is out of the office today. CSW inquired about ability to accept LOG while patient waits for auth. Staff reported that she cannot make that decision and that she would have to ask her VP. Staff agreed to ask VP. Staff agreed to contact CSW with an update.  CSW staffed case with CSW Chiropodist, CSW Chiropodist reported that he can approve a 5 day LOG while patient waits for auth.  CSW contacted [facility] for an update, staff reported that she was not able to get in contact with VP to ask about accepting patient with LOG. Staff reported that they would not be able to accept patient back today. Staff reported that she would contact CSW in the am about patient's options tomorrow for admitting back to SNF.   CSW provided update to patient's daughter. CSW provided update to patient's attending MD.   CSW will continue to follow and assist with discharge planning.  Celso Sickle, [place] Clinical Social Worker [facility] Cell#: [phone]

## 2017-05-17 NOTE — NC FL2 (Signed)
[place] MEDICAID FL2 LEVEL OF CARE SCREENING TOOL     IDENTIFICATION  Patient Name: [name] Birthdate: 1926-12-31 Sex: male Admission Date (Current Location): 05/14/2017  [facility] and [place] Number:  Producer, television/film/video and Address:  [facility],  [address], [place] [address]      Provider Number: [id number]  Attending Physician Name and Address:  Meredeth Ide, MD  Relative Name and Phone Number:       Current Level of Care: Hospital Recommended Level of Care: Skilled Nursing Facility Prior Approval Number:    Date Approved/Denied:   PASRR Number: [id number]  Discharge Plan: SNF    Current Diagnoses: Patient Active Problem List   Diagnosis Date Noted  . Acute encephalopathy 05/14/2017  . Permanent atrial fibrillation (HCC) 05/14/2017  . Chronic respiratory failure with hypoxia (HCC) 05/14/2017  . Chronic combined systolic and diastolic CHF (congestive heart failure) (HCC) 05/14/2017  . 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 Infected pressure ulcer 02/07/2017  . Normocytic anemia 02/07/2017  . A-fib (HCC) 01/31/2017  . Polyneuropathy 01/31/2017  . S/P left TKA 01/24/2017  . Non-ischemic cardiomyopathy (HCC) 10/15/2016  . Chronic systolic congestive heart failure (HCC) 05/28/2016  . Orthostatic hypotension 09/20/2015  . Impaired glucose tolerance 11/25/2014  . Bifascicular block 12/18/2013  . Bilateral edema of lower extremity 10/25/2013  . S/P left THA, AA 08/13/2013  . Chronic anticoagulation 12/06/2012  . General weakness 07/05/2012  . Hypotension 01/18/2012  . Paroxysmal Atrial fibrillation - admitted with RVR 01/18/2012  . DJD (degenerative joint disease), lumbar 01/18/2012  . Hx of colonic polyps 11/02/2011  . BENIGN PROSTATIC HYPERTROPHY 05/13/2010  . SEBORRHEA CAPITIS 02/13/2010  . CHRONIC RHINITIS 06/02/2009  . Hyperlipidemia 11/01/2008  . INSOMNIA, CHRONIC, MILD 01/02/2008  . Essential hypertension 02/06/2007   . ASTHMA 02/06/2007    Orientation RESPIRATION BLADDER Height & Weight     Self, Time, Situation, Place  O2 Indwelling catheter Weight: 233 lb 1.6 oz (105.7 kg) Height:  5\' 8"  (172.7 cm)  BEHAVIORAL SYMPTOMS/MOOD NEUROLOGICAL BOWEL NUTRITION STATUS        Diet(Heart)  AMBULATORY STATUS COMMUNICATION OF NEEDS Skin   Total Care Verbally PU Stage and Appropriate Care       PU Stage 4 Dressing: BID(Sacral pressure injury, Stage 4. Measurement:6cm x 6.5cm x 3.8cm Patient is incontinent of soft, formed  Stool.  Will implement a saline moistened gauze dressing performed twice daily and PRN incontinence.)               Personal Care Assistance Level of Assistance  Bathing, Feeding, Dressing Bathing Assistance: Maximum assistance Feeding assistance: Limited assistance Dressing Assistance: Maximum assistance     Functional Limitations Info  Sight, Hearing, Speech Sight Info: Adequate Hearing Info: Adequate Speech Info: Adequate    SPECIAL CARE FACTORS FREQUENCY                       Contractures Contractures Info: Not present    Additional Factors Info  Code Status, Allergies, Isolation Precautions Code Status Info: Partial  Allergies Info: Nsaids;Vicodin Hydrocodone-acetaminophen;Morphine And Related     Isolation Precautions Info: Infection: MRSA   Isolation: Enteric Precautions     Current Medications (05/17/2017):  This is the current hospital active medication list Current Facility-Administered Medications  Medication Dose Route Frequency Provider Last Rate Last Dose  . 0.9 %  sodium chloride infusion   Intravenous Continuous Meredeth Ide, MD 10 mL/hr at 05/16/17 1437    .  acetaminophen (TYLENOL) tablet 1,000 mg  1,000 mg Oral Andres Labrum, MD   1,000 mg at 05/17/17 0601  . albuterol (PROVENTIL) (2.5 MG/3ML) 0.083% nebulizer solution 2.5 mg  2.5 mg Inhalation Q4H PRN Tat, [name], MD      . amiodarone (PACERONE) tablet 200 mg  200 mg Oral Daily Tat, [name], MD    200 mg at 05/17/17 0947  . bumetanide (BUMEX) tablet 1 mg  1 mg Oral BID Meredeth Ide, MD   1 mg at 05/17/17 0947  . docusate sodium (COLACE) capsule 100 mg  100 mg Oral BID Catarina Hartshorn, MD   100 mg at 05/17/17 0947  . ertapenem (INVANZ) 1 g in sodium chloride 0.9 % 50 mL IVPB  1 g Intravenous Q24H Catarina Hartshorn, MD 100 mL/hr at 05/17/17 0947 1 g at 05/17/17 0947  . feeding supplement (ENSURE ENLIVE) (ENSURE ENLIVE) liquid 237 mL  237 mL Oral TID BM Meredeth Ide, MD   237 mL at 05/16/17 2118  . ferrous sulfate tablet 325 mg  325 mg Oral TID WC Catarina Hartshorn, MD   325 mg at 05/17/17 0947  . loratadine (CLARITIN) tablet 10 mg  10 mg Oral Daily Tat, [name], MD   10 mg at 05/17/17 0947  . magnesium oxide (MAG-OX) tablet 200 mg  200 mg Oral Daily Tat, [name], MD   200 mg at 05/17/17 0947  . midodrine (PROAMATINE) tablet 10 mg  10 mg Oral TID PRN Tat, Onalee Hua, MD      . multivitamin with minerals tablet 1 tablet  1 tablet Oral Daily Tat, [name], MD   1 tablet at 05/17/17 0947  . ondansetron (ZOFRAN) tablet 4 mg  4 mg Oral Q6H PRN Tat, [name], MD       Or  . ondansetron (ZOFRAN) injection 4 mg  4 mg Intravenous Q6H PRN Tat, [name], MD      . polyethylene glycol (MIRALAX / GLYCOLAX) packet 17 g  17 g Oral BID Tat, [name], MD   17 g at 05/16/17 1000  . rivaroxaban (XARELTO) tablet 20 mg  20 mg Oral Q2000 Catarina Hartshorn, MD   20 mg at 05/16/17 2118  . sodium chloride flush (NS) 0.9 % injection 10-40 mL  10-40 mL Intracatheter PRN Meredeth Ide, MD      . tamsulosin (FLOMAX) capsule 0.4 mg  0.4 mg Oral Daily Tat, [name], MD   0.4 mg at 05/17/17 0947  . tiotropium (SPIRIVA) inhalation capsule 18 mcg  18 mcg Inhalation Daily Tat, Onalee Hua, MD   18 mcg at 05/17/17 0906  . traMADol (ULTRAM) tablet 50-100 mg  50-100 mg Oral Q12H PRN Catarina Hartshorn, MD   50 mg at 05/15/17 2018  . vancomycin (VANCOCIN) IVPB 1000 mg/200 mL premix  1,000 mg Intravenous Q24H [facility]   Stopped at 05/16/17 1057     Discharge  Medications: Please see discharge summary for a list of discharge medications.  Relevant Imaging Results:  Relevant Lab Results:   Additional Information SS # [id number]  [name], LCSW

## 2017-05-17 NOTE — Progress Notes (Signed)
[facility]  PROGRESS NOTE  [name] ZOX:[id number] DOB: 07-Jun-1926 DOA: 05/14/2017 PCP: [name], MD   Brief HPI:   84 y.o. male with medical history of persistent atrial fibrillation, systolic and diastolic CHF, COPD, coccygeal osteomyelitis, hyperlipidemia presenting with fluctuating mental status since May 11, 2017.  The patient was recently discharged from the hospital after a stay from 02/07/17 through 02/21/17 when he was treated for sepsis secondary to sacral osteomyelitis.  A PICC line was placed, and the patient was discharged with antibiotics to finish on 03/22/2017.  His PICC line was subsequently removed after he finished his antibiotics.  He continued to receive wound care.  During the wound care visit on 04/20/2017, the patient was noted to have necrotic bone in his coccyx.  Bone biopsy and cultures were sent and were consistent with osteomyelitis.  Bone cultures grew group B streptococcus, diphtheroids, E. coli, and MRSA a PICC line was placed on 03/29/2017, the patient was started on vancomycin and ertapenem  With instructions to finish on 06/07/2017.  The patient was doing fairly well up until the last 3 days when his family noted that he has been having intermittent episodes of somnolence and difficulty to arouse.  In between these episodes, the patient will be fully lucid and able to speak with clarity.  On the morning of 05/14/2017, the patient was difficult to arouse, and was somnolent after he was awoken.  As result, the patient was transferred to the emergency department for further evaluation     Subjective   Patient seen and examined, complains of shortness of breath today. Bumex was held at the time of admission.   Assessment/Plan:    1. Encephalopathy- resolved, patient is back to his baseline, communicating. Patient was started on Invanz, there is a concern for possible seizures so EEG did not show epileptic activity, shows toxic metabolic  encephalopathy. UA is negative. Urine culture showed no growth. Ammonia 27, B12 508, magnesium 2.0, Pro calcitonin less than .10, TSH 1.806. 2. Coccygeal osteomyelitis - patient had wound vac placed on Friday, and became lethargic on Saturday. Could have become transiently bacteremic. Continue invanz. Follow blood culture results. 3. Permanent atrial fibrillation-heart rate is controlled, continue amiodarone, Xarelto.  4. Chronic systolic and diastolic CHF-will restart Bumex 20 mg by mouth twice a day. Follow BMP in a.m. 5. Penile edema- improved, was seen by urology yesterday and Foley catheter was replaced. 6. COPD-continue Spiriva 7. Acute kidney injury-resolved, today creatinine is 1.19 creatinine has jumped 1.30 with BUN  36. Bumex was held and patient started on gentle IV hydration with normal saline. Will change IV fluids to [facility] 8. Hyperlipidemia-continue Statin.    DVT prophylaxis: Xarelto  Code Status: partial code, DNI  Family Communication: discussed with patient's daughter at bedside  Disposition Plan: to be decided   Consultants:  none  Procedures:  none  Continuous infusions . sodium chloride 10 mL/hr at 05/16/17 1437  . ertapenem [facility]) IV Stopped (05/17/17 1223)  . vancomycin Stopped (05/17/17 1224)      Antibiotics:   Anti-infectives (From admission, onward)   Start     Dose/Rate Route Frequency Ordered Stop   05/16/17 0800  vancomycin (VANCOCIN) IVPB 1000 mg/200 mL premix     1,000 mg 200 mL/hr over 60 Minutes Intravenous Every 24 hours 05/16/17 0728     05/15/17 0800  ertapenem (INVANZ) 1 g in sodium chloride 0.9 % 50 mL IVPB     1 g 100 mL/hr over 30  Minutes Intravenous Every 24 hours 05/14/17 2050     05/14/17 1715  ceFEPIme (MAXIPIME) 2 g in dextrose 5 % 50 mL IVPB     2 g 100 mL/hr over 30 Minutes Intravenous  Once 05/14/17 1701 05/14/17 1941   05/14/17 1715  vancomycin (VANCOCIN) 1,500 mg in sodium chloride 0.9 % 500 mL IVPB  Status:   Discontinued     1,500 mg 250 mL/hr over 120 Minutes Intravenous  Once 05/14/17 1706 05/14/17 1721       Objective   Vitals:   05/16/17 2119 05/17/17 0552 05/17/17 0907 05/17/17 1557  BP: (!) 121/45 (!) 102/46  (!) 112/48  Pulse: 72 69  68  Resp: 20 20  20   Temp: 98.5 F (36.9 C) 98 F (36.7 C)  98.2 F (36.8 C)  TempSrc: Oral Oral  Oral  SpO2: 97% 94% 97% 98%  Weight:  105.7 kg (233 lb 1.6 oz)    Height:        Intake/Output Summary (Last 24 hours) at 05/17/2017 1906 Last data filed at 05/17/2017 1558 Gross per 24 hour  Intake 950 ml  Output 3000 ml  Net -2050 ml   Filed Weights   05/14/17 1301 05/16/17 0558 05/17/17 0552  Weight: 90.7 kg (200 lb) 96.2 kg (212 lb 1.3 oz) 105.7 kg (233 lb 1.6 oz)     Physical Examination:  Physical Exam: Eyes: No icterus, extraocular muscles intact  Mouth: Oral mucosa is moist, no lesions on palate,  Neck: Supple, no deformities, masses, or tenderness Lungs: Normal respiratory effort, bilateral clear to auscultation, no crackles or wheezes.  Heart: Regular rate and rhythm, S1 and S2 normal, no murmurs, rubs auscultated Abdomen: BS normoactive,soft,nondistended,non-tender to palpation,no organomegaly Extremities: No pretibial edema, no erythema, no cyanosis, no clubbing Neuro : Alert and oriented to time, place and person, No focal deficits Skin: No rashes seen on exam     Data Reviewed: I have personally reviewed following labs and imaging studies  CBG: No results for input(s): GLUCAP in the last 168 hours.  CBC: Recent Labs  Lab 05/14/17 1316 05/15/17 0712  WBC 6.2 5.3  NEUTROABS 3.8  --   HGB 9.0* 9.3*  HCT 29.5* 30.9*  MCV 96.4 96.6  PLT 234 226    Basic Metabolic Panel: Recent Labs  Lab 05/14/17 1316 05/14/17 2105 05/15/17 0712 05/16/17 0539 05/17/17 0412  NA 137  --  137 139 139  K 4.2  --  4.0 4.2 4.2  CL 98*  --  101 102 102  CO2 33*  --  31 33* 34*  GLUCOSE 126*  --  98 106* 113*  BUN 31*  --   36* 43* 30*  CREATININE 0.96  --  1.30* 1.19 0.90  CALCIUM 8.7*  --  8.2* 8.5* 8.7*  MG  --  2.0  --   --   --     Recent Results (from the past 240 hour(s))  Culture, blood (routine x 2) Call MD if unable to obtain prior to antibiotics being given     Status: None (Preliminary result)   Collection Time: 05/14/17  5:01 PM  Result Value Ref Range Status   Specimen Description BLOOD PICC LINE  Final   Special Requests   Final    BOTTLES DRAWN AEROBIC AND ANAEROBIC Blood Culture adequate volume   Culture   Final    NO GROWTH 2 DAYS Performed at [facility], [address], [place], [place] [address]  Report Status PENDING  Incomplete  Culture, blood (routine x 2) Call MD if unable to obtain prior to antibiotics being given     Status: None (Preliminary result)   Collection Time: 05/14/17  5:06 PM  Result Value Ref Range Status   Specimen Description LEFT ANTECUBITAL  Final   Special Requests   Final    BOTTLES DRAWN AEROBIC AND ANAEROBIC Blood Culture adequate volume   Culture   Final    NO GROWTH 2 DAYS Performed at [facility], [address] [place], [place] [address]    Report Status PENDING  Incomplete  Culture, Urine     Status: None   Collection Time: 05/15/17  6:52 AM  Result Value Ref Range Status   Specimen Description URINE, RANDOM  Final   Special Requests NONE  Final   Culture   Final    NO GROWTH Performed at [facility], [address], [place], [place] [address]    Report Status 05/16/2017 FINAL  Final  MRSA PCR Screening     Status: None   Collection Time: 05/16/17  2:15 AM  Result Value Ref Range Status   MRSA by PCR NEGATIVE NEGATIVE Final    Comment:        The GeneXpert MRSA Assay (FDA approved for NASAL specimens only), is one component of a comprehensive MRSA colonization surveillance program. It is not intended to diagnose MRSA infection nor to guide or monitor treatment for MRSA infections.      Liver Function  Tests: Recent Labs  Lab 05/14/17 1316  AST 30  ALT 20  ALKPHOS 80  BILITOT 0.6  PROT 5.7*  ALBUMIN 2.5*   No results for input(s): LIPASE, AMYLASE in the last 168 hours. Recent Labs  Lab 05/14/17 2105  AMMONIA 27       Studies: No results found.  Scheduled Meds: . acetaminophen  1,000 mg Oral Q8H  . amiodarone  200 mg Oral Daily  . bumetanide  1 mg Oral BID  . docusate sodium  100 mg Oral BID  . feeding supplement (ENSURE ENLIVE)  237 mL Oral TID BM  . ferrous sulfate  325 mg Oral TID WC  . loratadine  10 mg Oral Daily  . magnesium oxide  200 mg Oral Daily  . multivitamin with minerals  1 tablet Oral Daily  . polyethylene glycol  17 g Oral BID  . rivaroxaban  20 mg Oral Q2000  . tamsulosin  0.4 mg Oral Daily  . tiotropium  18 mcg Inhalation Daily      Time spent: 25 min  Meredeth Ide   [facility] Pager [phone]. If 7PM-7AM, please contact night-coverage at [email or web address], Office  [phone]  password [email or web address]  05/17/2017, 7:06 PM  LOS: 2 days

## 2017-05-18 LAB — BASIC METABOLIC PANEL
ANION GAP: 4 — AB (ref 5–15)
BUN: 23 mg/dL — ABNORMAL HIGH (ref 6–20)
CHLORIDE: 97 mmol/L — AB (ref 101–111)
CO2: 39 mmol/L — ABNORMAL HIGH (ref 22–32)
Calcium: 8.4 mg/dL — ABNORMAL LOW (ref 8.9–10.3)
Creatinine, Ser: 0.82 mg/dL (ref 0.61–1.24)
GFR calc non Af Amer: >60 mL/min (ref >60)
Glucose, Bld: 104 mg/dL — ABNORMAL HIGH (ref 65–99)
Potassium: 3.7 mmol/L (ref 3.5–5.1)
Sodium: 140 mmol/L (ref 135–145)

## 2017-05-18 MED ORDER — ALBUTEROL SULFATE (2.5 MG/3ML) 0.083% IN NEBU
2.5000 mg | INHALATION_SOLUTION | Freq: Three times a day (TID) | RESPIRATORY_TRACT | Status: DC
  Administered 2017-05-19: 2.5 mg via RESPIRATORY_TRACT
  Filled 2017-05-18: qty 3

## 2017-05-18 NOTE — Progress Notes (Signed)
[facility]  PROGRESS NOTE  [name] ZOX:[id number] DOB: [birth year] DOA: 05/14/2017   PCP: [name], MD  Brief HPI:   84 y.o. male with medical history of persistent atrial fibrillation, systolic and diastolic CHF, COPD, coccygeal osteomyelitis, hyperlipidemia presenting with fluctuating mental status since May 11, 2017.  The patient was recently discharged from the hospital after a stay from 02/07/17 through 02/21/17 when he was treated for sepsis secondary to sacral osteomyelitis.  A PICC line was placed, and the patient was discharged with antibiotics to finish on 03/22/2017.  His PICC line was subsequently removed after he finished his antibiotics.  He continued to receive wound care.  During the wound care visit on 04/20/2017, the patient was noted to have necrotic bone in his coccyx.  Bone biopsy and cultures were sent and were consistent with osteomyelitis. Bone cultures grew group B streptococcus, diphtheroids, E. coli, and MRSA a PICC line was placed on 03/29/2017, the patient was started on vancomycin and ertapenem  With instructions to finish on 06/07/2017.  The patient was doing fairly well up until the last 3 days when his family noted that he has been having intermittent episodes of somnolence and difficulty to arouse.  In between these episodes, the patient will be fully lucid and able to speak with clarity.  On the morning of 05/14/2017, the patient was difficult to arouse, and was somnolent after he was awoken.  As result, the patient was transferred to the emergency department for further evaluation  Subjective   Pt reports more tired this AM, denies chest pain but reports intermittent dyspnea.    Assessment/Plan:   Acute encephalopathy - appears to be multifactorial and secondary to possible UTI, there was also question of possible seizure secondary to ertapenem and tramadol - EEG was done and was notable for mild to moderate global slowing indicating  global encephalopathy, typically seen in toxic metabolic process  - pt seems to be still struggling with intermittent episodes of confusion  - pt was started on Ertapenem and Vancomycin by ID team and I think it is reasonable to consult with ID to determine if pt may benefit from another ABX - foley cath was changed on 05/13/2017  Coccygeal osteomyelitis - Bone biopsy and culture 04/20/2017 - Bone cultures grew group B streptococcus, diphtheroids, E. coli, and MRSA - PICC line was placed on 03/29/2017, the patient was started on vancomycin and ertapenem with instructions to finish on 06/07/2017 - plan on consulting with ID regarding continuation of ABX  Chronic respiratory failure with hypoxia - intermittently on oxygen 1-2 L at SNF - will obtain CXR in AM  Permanent atrial fibrillation - rate has been controlled - continue Rivaroxaban and amiodarone   Acute on Chronic systolic and diastolic CHF -02/21/2017 discharge weight 203 lbs - cont home regimen with Bumex - ECHO 09/04/2015 with EF 50-55% and grade I diastolic CHF - monitor daily weights - this is the weight trend since admission Filed Weights   05/16/17 0558 05/17/17 0552 05/18/17 0700  Weight: 96.2 kg (212 lb 1.3 oz) 105.7 kg (233 lb 1.6 oz) 104.4 kg (230 lb 3.2 oz)  - ECHO requested  COPD - Continue Spiriva - no wheezing on exam   Hyperlipidemia - continue statin   Obesity  - Body mass index is 35.3 kg/m  Penile edema - improved - pt seen by urology  - Foley cath replaced  Acute kidney injury - resolved with hydration - weight is up and will therefore  stop IVF    DVT prophylaxis: Xarelto Code Status: partial code, DNI Family Communication: daughter updated at bedside  Disposition Plan: to be determined   Consultants:  none  Procedures:  none  Antibiotics:   Anti-infectives (From admission, onward)   Start     Dose/Rate Route Frequency Ordered Stop   05/16/17 0800  vancomycin (VANCOCIN)  IVPB 1000 mg/200 mL premix     1,000 mg 200 mL/hr over 60 Minutes Intravenous Every 24 hours 05/16/17 0728     05/15/17 0800  ertapenem (INVANZ) 1 g in sodium chloride 0.9 % 50 mL IVPB     1 g 100 mL/hr over 30 Minutes Intravenous Every 24 hours 05/14/17 2050     05/14/17 1715  ceFEPIme (MAXIPIME) 2 g in dextrose 5 % 50 mL IVPB     2 g 100 mL/hr over 30 Minutes Intravenous  Once 05/14/17 1701 05/14/17 1941   05/14/17 1715  vancomycin (VANCOCIN) 1,500 mg in sodium chloride 0.9 % 500 mL IVPB  Status:  Discontinued     1,500 mg 250 mL/hr over 120 Minutes Intravenous  Once 05/14/17 1706 05/14/17 1721      Objective   Vitals:   05/18/17 0606 05/18/17 0700 05/18/17 0900 05/18/17 1614  BP: (!) 115/50   (!) 99/43  Pulse: 70   78  Resp: 18   16  Temp: 97.8 F (36.6 C)  99 F (37.2 C) 98.5 F (36.9 C)  TempSrc: Oral  Axillary Axillary  SpO2: 98%   95%  Weight:  104.4 kg (230 lb 3.2 oz)    Height:        Intake/Output Summary (Last 24 hours) at 05/18/2017 1916 Last data filed at 05/18/2017 1105 Gross per 24 hour  Intake 10 ml  Output 1975 ml  Net -1965 ml   Filed Weights   05/16/17 0558 05/17/17 0552 05/18/17 0700  Weight: 96.2 kg (212 lb 1.3 oz) 105.7 kg (233 lb 1.6 oz) 104.4 kg (230 lb 3.2 oz)     Physical Exam  Constitutional: Appears tired, chronically ill, somnolent  CVS: RRR, S1/S2 +, no gallops, no carotid bruit.  Pulmonary: mild crackles at bases  Abdominal: Soft. BS +,  no distension, tenderness, rebound or guarding.  Musculoskeletal: Bilateral +1 pitting edema in LE's Neuro: somnolent but easy to awake, able to follow commands but quickly doses off   Data Reviewed: I have personally reviewed following labs and imaging studies   CBC: Recent Labs  Lab 05/14/17 1316 05/15/17 0712  WBC 6.2 5.3  NEUTROABS 3.8  --   HGB 9.0* 9.3*  HCT 29.5* 30.9*  MCV 96.4 96.6  PLT 234 226    Basic Metabolic Panel: Recent Labs  Lab 05/14/17 1316 05/14/17 2105  05/15/17 0712 05/16/17 0539 05/17/17 0412 05/18/17 0414  NA 137  --  137 139 139 140  K 4.2  --  4.0 4.2 4.2 3.7  CL 98*  --  101 102 102 97*  CO2 33*  --  31 33* 34* 39*  GLUCOSE 126*  --  98 106* 113* 104*  BUN 31*  --  36* 43* 30* 23*  CREATININE 0.96  --  1.30* 1.19 0.90 0.82  CALCIUM 8.7*  --  8.2* 8.5* 8.7* 8.4*  MG  --  2.0  --   --   --   --     Recent Results (from the past 240 hour(s))  Culture, blood (routine x 2) Call MD if unable to obtain prior  to antibiotics being given     Status: None (Preliminary result)   Collection Time: 05/14/17  5:01 PM  Result Value Ref Range Status   Specimen Description BLOOD PICC LINE  Final   Special Requests   Final    BOTTLES DRAWN AEROBIC AND ANAEROBIC Blood Culture adequate volume   Culture   Final    NO GROWTH 3 DAYS Performed at [facility], [address] [place], [place] [address]    Report Status PENDING  Incomplete  Culture, blood (routine x 2) Call MD if unable to obtain prior to antibiotics being given     Status: None (Preliminary result)   Collection Time: 05/14/17  5:06 PM  Result Value Ref Range Status   Specimen Description LEFT ANTECUBITAL  Final   Special Requests   Final    BOTTLES DRAWN AEROBIC AND ANAEROBIC Blood Culture adequate volume   Culture   Final    NO GROWTH 3 DAYS Performed at [facility], [address], [place], [place] [address]    Report Status PENDING  Incomplete  Culture, Urine     Status: None   Collection Time: 05/15/17  6:52 AM  Result Value Ref Range Status   Specimen Description URINE, RANDOM  Final   Special Requests NONE  Final   Culture   Final    NO GROWTH Performed at [facility], [address], [place], [place] [address]    Report Status 05/16/2017 FINAL  Final  MRSA PCR Screening     Status: None   Collection Time: 05/16/17  2:15 AM  Result Value Ref Range Status   MRSA by PCR NEGATIVE NEGATIVE Final    Comment:        The GeneXpert MRSA Assay  (FDA approved for NASAL specimens only), is one component of a comprehensive MRSA colonization surveillance program. It is not intended to diagnose MRSA infection nor to guide or monitor treatment for MRSA infections.      Liver Function Tests: Recent Labs  Lab 05/14/17 1316  AST 30  ALT 20  ALKPHOS 80  BILITOT 0.6  PROT 5.7*  ALBUMIN 2.5*   Recent Labs  Lab 05/14/17 2105  AMMONIA 27   Studies: No results found.  Scheduled Meds: . acetaminophen  1,000 mg Oral Q8H  . amiodarone  200 mg Oral Daily  . bumetanide  1 mg Oral BID  . docusate sodium  100 mg Oral BID  . feeding supplement (ENSURE ENLIVE)  237 mL Oral TID BM  . ferrous sulfate  325 mg Oral TID WC  . loratadine  10 mg Oral Daily  . magnesium oxide  200 mg Oral Daily  . multivitamin with minerals  1 tablet Oral Daily  . polyethylene glycol  17 g Oral BID  . rivaroxaban  20 mg Oral Q2000  . tamsulosin  0.4 mg Oral Daily  . tiotropium  18 mcg Inhalation Daily   Time spent: 25 min  Willeen Novak Magick-Daliyah Sramek   [facility] Pager [phone]. If 7PM-7AM, please contact night-coverage at [email or web address], Office  [phone]  password [email or web address]  05/18/2017, 7:16 PM  LOS: 3 days

## 2017-05-18 NOTE — Plan of Care (Signed)
  Clinical Measurements: Respiratory complications will improve 05/18/2017 2137 - Not Progressing by Herbert Pun, RN   Activity: Risk for activity intolerance will decrease 05/18/2017 2137 - Not Progressing by Herbert Pun, RN

## 2017-05-18 NOTE — Progress Notes (Signed)
CSW contacted patient's insurance ([employer]) to inquire about patient's insurance authorization status. Staff member Angelique reported that all paperwork was received on 05/16/2017 but patient's insurance authorization had not been completed. Staff reported that she was going to complete patient's authorization and contact CSW with an update. CSW awaiting return call from patient's insurance company regarding insurance authorization.  CSW will continue to follow and assist with discharge planning.  Celso Sickle, [place] Clinical Social Worker [facility] Cell#: [phone]

## 2017-05-19 ENCOUNTER — Inpatient Hospital Stay (HOSPITAL_COMMUNITY): Payer: Medicare Other

## 2017-05-19 DIAGNOSIS — I350 Nonrheumatic aortic (valve) stenosis: Secondary | ICD-10-CM

## 2017-05-19 LAB — ECHOCARDIOGRAM COMPLETE
HEIGHTINCHES: 68 in
WEIGHTICAEL: 3683.2 [oz_av]

## 2017-05-19 LAB — BASIC METABOLIC PANEL
ANION GAP: 6 (ref 5–15)
BUN: 20 mg/dL (ref 6–20)
CO2: 41 mmol/L — ABNORMAL HIGH (ref 22–32)
Calcium: 8.7 mg/dL — ABNORMAL LOW (ref 8.9–10.3)
Chloride: 96 mmol/L — ABNORMAL LOW (ref 101–111)
Creatinine, Ser: 0.94 mg/dL (ref 0.61–1.24)
Glucose, Bld: 102 mg/dL — ABNORMAL HIGH (ref 65–99)
POTASSIUM: 4.2 mmol/L (ref 3.5–5.1)
SODIUM: 143 mmol/L (ref 135–145)

## 2017-05-19 LAB — BLOOD GAS, VENOUS
ACID-BASE EXCESS: 15.8 mmol/L — AB (ref 0.0–2.0)
Bicarbonate: 44.9 mmol/L — ABNORMAL HIGH (ref 20.0–28.0)
O2 Content: 2 L/min
O2 SAT: 75.1 %
PO2 VEN: 43.4 mmHg (ref 32.0–45.0)
Patient temperature: 98.6
pCO2, Ven: 94.8 mmHg (ref 44.0–60.0)
pH, Ven: 7.297 (ref 7.250–7.430)

## 2017-05-19 LAB — CBC
HCT: 32.5 % — ABNORMAL LOW (ref 39.0–52.0)
Hemoglobin: 9.5 g/dL — ABNORMAL LOW (ref 13.0–17.0)
MCH: 29.2 pg (ref 26.0–34.0)
MCHC: 29.2 g/dL — ABNORMAL LOW (ref 30.0–36.0)
MCV: 100 fL (ref 78.0–100.0)
PLATELETS: 228 10*3/uL (ref 150–400)
RBC: 3.25 MIL/uL — AB (ref 4.22–5.81)
RDW: 15.1 % (ref 11.5–15.5)
WBC: 5.1 10*3/uL (ref 4.0–10.5)

## 2017-05-19 LAB — BRAIN NATRIURETIC PEPTIDE: B NATRIURETIC PEPTIDE 5: 824 pg/mL — AB (ref 0.0–100.0)

## 2017-05-19 MED ORDER — FUROSEMIDE 10 MG/ML IJ SOLN
60.0000 mg | Freq: Two times a day (BID) | INTRAMUSCULAR | Status: DC
  Administered 2017-05-19 – 2017-05-23 (×8): 60 mg via INTRAVENOUS
  Filled 2017-05-19 (×9): qty 6

## 2017-05-19 MED ORDER — SODIUM CHLORIDE 0.9 % IV SOLN
1.0000 g | Freq: Three times a day (TID) | INTRAVENOUS | Status: DC
  Administered 2017-05-20 – 2017-05-28 (×24): 1 g via INTRAVENOUS
  Filled 2017-05-19 (×28): qty 1

## 2017-05-19 MED ORDER — PIPERACILLIN-TAZOBACTAM 3.375 G IVPB
3.3750 g | Freq: Three times a day (TID) | INTRAVENOUS | Status: DC
  Administered 2017-05-19: 3.375 g via INTRAVENOUS
  Filled 2017-05-19: qty 50

## 2017-05-19 MED ORDER — ALBUTEROL SULFATE (2.5 MG/3ML) 0.083% IN NEBU
2.5000 mg | INHALATION_SOLUTION | Freq: Two times a day (BID) | RESPIRATORY_TRACT | Status: DC
  Administered 2017-05-19 – 2017-05-28 (×15): 2.5 mg via RESPIRATORY_TRACT
  Filled 2017-05-19 (×15): qty 3

## 2017-05-19 NOTE — Progress Notes (Signed)
Pharmacy Antibiotic Note  [name] is a 84 y.o. male admitted on 05/14/2017 with sepsis/osteomyelitis.  Pt came in on vanc and ertapemen that was started on 11/13 with instruction to finish on 06/07/17.  Pharmacy consulted to dose vancomycin.  Initially vanc trough was elevated and doses were held.  Vanc was resumed on 12/31.  Today pharmacy consulted to dose zosyn for wound infection.  Ertapenem has been d/c'd.  Now changing Zosyn to Meropenem based on previous wound culture from 04/20/17  05/19/2017 Scr 0.94, CrCl ~ 53mls/min WBC 5.1 afebrile  Plan: Meropenem 1g IV q8h Continue to monitor renal function and adjust as needed  Height: 5\' 8"  (172.7 cm) Weight: 230 lb 3.2 oz (104.4 kg) IBW/kg (Calculated) : 68.4  Temp (24hrs), Avg:99.5 F (37.5 C), Min:99.3 F (37.4 C), Max:99.7 F (37.6 C)  Recent Labs  Lab 05/14/17 1316 05/14/17 1332 05/14/17 1457 05/14/17 1506 05/14/17 1720 05/15/17 0712 05/16/17 0539 05/17/17 0412 05/18/17 0414 05/19/17 0458  WBC 6.2  --   --   --   --  5.3  --   --   --  5.1  CREATININE 0.96  --   --   --   --  1.30* 1.19 0.90 0.82 0.94  LATICACIDVEN  --  0.36* 0.5 0.32*  --   --   --   --   --   --   VANCOTROUGH  --   --   --   --  28*  --   --   --   --   --   VANCORANDOM  --   --   --   --   --  23 18  --   --   --     Estimated Creatinine Clearance: 63.6 mL/min (by C-G formula based on SCr of 0.94 mg/dL).    Allergies  Allergen Reactions  . Nsaids     Stomach pain  . Vicodin [Hydrocodone-Acetaminophen] Other (See Comments)    Unknown reaction many years ago  . Morphine And Related Nausea And Vomiting    Dizziness, light headed. "Opiates cause tightness in chest".   Antimicrobials this admission:  (12/12 PTA )  vancomycin >>  (12/12 PTA)  ertapenem >> 1/3 1/3 zosyn x 1 dose 1/3 meropenem >>  Dose adjustments this admission:  12/29: VT at 1720: 28 mcg/ml, continue to hold vanc (was on 1gm q12h) 12/30 0600 VR: 23 mcg/ml, continue to  hold vanc  12/31: 0600 VR: 52mcg/ml  Microbiology results:  12/30 BCx: ngtd 12/30 UCx:  ngf 12/30 MRSA PCR: neg  12/5 Coccyx bone: MRSA + E.coli (S cefepime, imipenem, bactrim; R zosyn)  Thank you for allowing pharmacy to be a part of this patient's care.   Loralee Pacas, PharmD, BCPS Pager: [phone] 05/19/2017, 6:48 PM

## 2017-05-19 NOTE — Care Management Important Message (Signed)
Important Message  Patient Details IM Letter given to Kathy/Case Manager to present to the Patient Name: [name] MRN: [id number] Date of Birth: 26-Dec-1926   Medicare Important Message Given:  Yes    Caren Macadam 05/19/2017, 11:15 AMImportant Message  Patient Details  Name: [name] MRN: [id number] Date of Birth: August 11, 1926   Medicare Important Message Given:  Yes    Caren Macadam 05/19/2017, 11:15 AM

## 2017-05-19 NOTE — Progress Notes (Signed)
MD aware pt has to be in stepdown to start BIPAP. Pt can talk in full sentences. Pt seems to have some ALM with the way he is talking. No distress noted at this time. Rt will start BIPAP once pt arrives in ICU/tepdown.

## 2017-05-19 NOTE — Progress Notes (Signed)
PROGRESS NOTE    [name]  ZOX:[id number] DOB: 1926/12/11 DOA: 05/14/2017 PCP: [name], MD   Brief Narrative: ( 84 y.o.malewith medical history ofpersistent atrial fibrillation, systolic and diastolic CHF, COPD, coccygeal osteomyelitis, hyperlipidemia presenting with fluctuating mental status since May 11, 2017. The patient was recently discharged from the hospital after [name] stay from 02/07/17 through 02/21/17 when he was treated for sepsis secondary to sacral osteomyelitis. [name] PICC line was placed, and the patient was discharged with antibiotics to finish on 03/22/2017. His PICC line was subsequently removed after he finished his antibiotics. He continued to receive wound care. During the wound care visit on 04/20/2017, the patient was noted to have necrotic bone in his coccyx. Bone biopsy and cultures were sent and were consistent with osteomyelitis. Bone cultures grew group B streptococcus, diphtheroids, E. coli, and MRSA [name] PICC line was placed on 03/29/2017, the patient was started on vancomycin and ertapenem With instructions to finish on 06/07/2017.The patient was doing fairly well up until the last 3 days when his family noted that he has been having intermittent episodes of somnolence and difficulty to arouse. In between these episodes, the patient will be fully lucid and able to speak with clarity. On the morning of 05/14/2017, the patient was difficult to arouse, and was somnolent after he was awoken. As result, the patient was transferred to the emergency department for further evaluation  Assessment & Plan:   Active Problems:   Hypotension   Osteomyelitis of coccyx (HCC)   Acute encephalopathy   Permanent atrial fibrillation (HCC)   Chronic respiratory failure with hypoxia (HCC)   Chronic combined systolic and diastolic CHF (congestive heart failure) (HCC)  Acute encephalopathy - appears to be multifactorial and secondary to possible UTI (UA with  WBC's, RBC's, bacteria, but urine cx no growth), there was also question of possible seizure secondary to ertapenem and tramadol - EEG was done and was notable for mild to moderate global slowing indicating global encephalopathy, typically seen in toxic metabolic process  - continues to be intermittently confused - pt was started on Ertapenem and Vancomycin by ID team -> discussed with Dr. Drue Second from ID regarding whether or not these abx could be contributing.  After discussion with ID initially planned to trial pt on zosyn and d/c ertapenem (but after review of cultures, e. Coli was resistant to zosyn.  Will start meropenem instead (had discussed this with ID as well).  Will touch base with ID again tomorrow. - VBG ordered with normal pH, but elevated pCO2.  PCO2 normal [name] few months ago.  Bicarb is high, likely some metabolic compensation (it's been worsening over past few days).  Will trial bipap to see if this helps.  Repeat VBG in am.  - foley cath was changed on 05/13/2017  Coccygeal osteomyelitis - Bone biopsy and culture 04/20/2017 - Bone cultures grew group B streptococcus, diphtheroids, E. coli, and MRSA - PICC line was placed on 03/29/2017, the patient was started on vancomycin and ertapenem with instructions to finish on 06/07/2017 - plan on consulting with ID regarding continuation of ABX (as above)  Chronic respiratory failure with hypoxia - intermittently on oxygen 1-2 L at SNF - CXR with evidence of HF, lasix as below  Permanent atrial fibrillation - rate has been controlled - continue Rivaroxaban and amiodarone   Acute on Chronic systolic and diastolic CHF -02/21/2017 discharge weight 203 lbs - cont home regimen with Bumex -> transition to IV lasix 60 mg BID -  ECHO 09/04/2015 with EF 50-55% and grade I diastolic CHF - monitor daily weights - this is the weight trend since admission - ECHO pending  Filed Weights   05/16/17 0558 05/17/17 0552 05/18/17 0700  Weight: 96.2  kg (212 lb 1.3 oz) 105.7 kg (233 lb 1.6 oz) 104.4 kg (230 lb 3.2 oz)    COPD - Continue Spiriva - no wheezing on exam   Hyperlipidemia - continue statin   Obesity  - Body mass index is 35.3 kg/m  Penile edema - improved - pt seen by urology  - Foley cath replaced  Acute kidney injury - resolved with hydration - weight is up and will therefore stop IVF   DVT prophylaxis: xarelto Code Status: partial, CPR, no intubation Family Communication: daugther, discussed plan and transfer to stepdown for bipap trial Disposition Plan: pending improvement   Consultants:   ID phone  Procedures:   none  Antimicrobials:  Anti-infectives (From admission, onward)   Start     Dose/Rate Route Frequency Ordered Stop   05/19/17 1215  piperacillin-tazobactam (ZOSYN) IVPB 3.375 g  Status:  Discontinued     3.375 g 12.5 mL/hr over 240 Minutes Intravenous Every 8 hours 05/19/17 1202 05/19/17 1836   05/16/17 0800  vancomycin (VANCOCIN) IVPB 1000 mg/200 mL premix     1,000 mg 200 mL/hr over 60 Minutes Intravenous Every 24 hours 05/16/17 0728     05/15/17 0800  ertapenem (INVANZ) 1 g in sodium chloride 0.9 % 50 mL IVPB  Status:  Discontinued     1 g 100 mL/hr over 30 Minutes Intravenous Every 24 hours 05/14/17 2050 05/19/17 1150   05/14/17 1715  ceFEPIme (MAXIPIME) 2 g in dextrose 5 % 50 mL IVPB     2 g 100 mL/hr over 30 Minutes Intravenous  Once 05/14/17 1701 05/14/17 1941   05/14/17 1715  vancomycin (VANCOCIN) 1,500 mg in sodium chloride 0.9 % 500 mL IVPB  Status:  Discontinued     1,500 mg 250 mL/hr over 120 Minutes Intravenous  Once 05/14/17 1706 05/14/17 1721        Subjective: [name]&Ox3. Pain "all over" and in bottom.  Objective: Vitals:   05/18/17 2152 05/19/17 0650 05/19/17 0837 05/19/17 0847  BP:  (!) 95/38    Pulse:  71    Resp:  (!) 21    Temp:  99.5 F (37.5 C)    TempSrc:  Oral    SpO2: 94% 92% 99% 99%  Weight:      Height:        Intake/Output Summary  (Last 24 hours) at 05/19/2017 1128 Last data filed at 05/19/2017 0600 Gross per 24 hour  Intake 1010 ml  Output 600 ml  Net 410 ml   Filed Weights   05/16/17 0558 05/17/17 0552 05/18/17 0700  Weight: 96.2 kg (212 lb 1.3 oz) 105.7 kg (233 lb 1.6 oz) 104.4 kg (230 lb 3.2 oz)    Examination:  General exam: Appears calm and comfortable  Respiratory system: Clear to auscultation. Respiratory effort normal. Cardiovascular system: S1 & S2 heard, RRR. No JVD, murmurs, rubs, gallops or clicks. No pedal edema. Gastrointestinal system: Abdomen is nondistended, soft and nontender. No organomegaly or masses felt. Normal bowel sounds heard. Central nervous system: Alert and oriented x 3.  Moving all extremities. Extremities: Symmetric 1+ nonpitting edema. Skin: decubitus ulcer not examined today Psychiatry: Judgement and insight appear normal. Mood & affect appropriate.     Data Reviewed: I have personally reviewed following labs and  imaging studies  CBC: Recent Labs  Lab 05/14/17 1316 05/15/17 0712 05/19/17 0458  WBC 6.2 5.3 5.1  NEUTROABS 3.8  --   --   HGB 9.0* 9.3* 9.5*  HCT 29.5* 30.9* 32.5*  MCV 96.4 96.6 100.0  PLT 234 226 228   Basic Metabolic Panel: Recent Labs  Lab 05/14/17 2105 05/15/17 0712 05/16/17 0539 05/17/17 0412 05/18/17 0414 05/19/17 0458  NA  --  137 139 139 140 143  K  --  4.0 4.2 4.2 3.7 4.2  CL  --  101 102 102 97* 96*  CO2  --  31 33* 34* 39* 41*  GLUCOSE  --  98 106* 113* 104* 102*  BUN  --  36* 43* 30* 23* 20  CREATININE  --  1.30* 1.19 0.90 0.82 0.94  CALCIUM  --  8.2* 8.5* 8.7* 8.4* 8.7*  MG 2.0  --   --   --   --   --    GFR: Estimated Creatinine Clearance: 63.6 mL/min (by C-G formula based on SCr of 0.94 mg/dL). Liver Function Tests: Recent Labs  Lab 05/14/17 1316  AST 30  ALT 20  ALKPHOS 80  BILITOT 0.6  PROT 5.7*  ALBUMIN 2.5*   No results for input(s): LIPASE, AMYLASE in the last 168 hours. Recent Labs  Lab 05/14/17 2105    AMMONIA 27   Coagulation Profile: No results for input(s): INR, PROTIME in the last 168 hours. Cardiac Enzymes: No results for input(s): CKTOTAL, CKMB, CKMBINDEX, TROPONINI in the last 168 hours. BNP (last 3 results) No results for input(s): PROBNP in the last 8760 hours. HbA1C: No results for input(s): HGBA1C in the last 72 hours. CBG: No results for input(s): GLUCAP in the last 168 hours. Lipid Profile: No results for input(s): CHOL, HDL, LDLCALC, TRIG, CHOLHDL, LDLDIRECT in the last 72 hours. Thyroid Function Tests: No results for input(s): TSH, T4TOTAL, FREET4, T3FREE, THYROIDAB in the last 72 hours. Anemia Panel: No results for input(s): VITAMINB12, FOLATE, FERRITIN, TIBC, IRON, RETICCTPCT in the last 72 hours. Sepsis Labs: Recent Labs  Lab 05/14/17 1332 05/14/17 1457 05/14/17 1506 05/14/17 2105  PROCALCITON  --   --   --  <0.10  LATICACIDVEN 0.36* 0.5 0.32*  --     Recent Results (from the past 240 hour(s))  Culture, blood (routine x 2) Call MD if unable to obtain prior to antibiotics being given     Status: None (Preliminary result)   Collection Time: 05/14/17  5:01 PM  Result Value Ref Range Status   Specimen Description BLOOD PICC LINE  Final   Special Requests   Final    BOTTLES DRAWN AEROBIC AND ANAEROBIC Blood Culture adequate volume   Culture   Final    NO GROWTH 4 DAYS Performed at [facility], [address], [place], [place] [address]    Report Status PENDING  Incomplete  Culture, blood (routine x 2) Call MD if unable to obtain prior to antibiotics being given     Status: None (Preliminary result)   Collection Time: 05/14/17  5:06 PM  Result Value Ref Range Status   Specimen Description LEFT ANTECUBITAL  Final   Special Requests   Final    BOTTLES DRAWN AEROBIC AND ANAEROBIC Blood Culture adequate volume   Culture   Final    NO GROWTH 4 DAYS Performed at [facility], [address] [place], [place] [address]    Report Status PENDING   Incomplete  Culture,  Urine     Status: None   Collection Time: 05/15/17  6:52 AM  Result Value Ref Range Status   Specimen Description URINE, RANDOM  Final   Special Requests NONE  Final   Culture   Final    NO GROWTH Performed at [facility], [address], [place], [place] [address]    Report Status 05/16/2017 FINAL  Final  MRSA PCR Screening     Status: None   Collection Time: 05/16/17  2:15 AM  Result Value Ref Range Status   MRSA by PCR NEGATIVE NEGATIVE Final    Comment:        The GeneXpert MRSA Assay (FDA approved for NASAL specimens only), is one component of [facility]ail[facility] Nancarrow comprehensive MRSA colonization surveillance program. It is not intended to diagnose MRSA infection nor to guide or monitor treatment for MRSA infections.          Radiology Studies: Dg Chest 1 View  Result Date: 05/19/2017 CLINICAL DATA:  Shortness of breath . EXAM: CHEST 1 VIEW COMPARISON:  05/14/2017 . FINDINGS: Right PICC line noted in stable position. Cardiomegaly with pulmonary interstitial prominence and bilateral pleural effusions. Findings consistent with CHF . Findings have progressed prior exam. No pneumothorax . IMPRESSION: 1.  Right PICC line in stable position. 2. Congestive heart failure with bilateral pulmonary interstitial edema bilateral pleural effusions. Electronically Signed   By: Maisie Fus  Register   On: 05/19/2017 08:31        Scheduled Meds: . acetaminophen  1,000 mg Oral Q8H  . albuterol  2.5 mg Nebulization BID  . amiodarone  200 mg Oral Daily  . bumetanide  1 mg Oral BID  . docusate sodium  100 mg Oral BID  . feeding supplement (ENSURE ENLIVE)  237 mL Oral TID BM  . ferrous sulfate  325 mg Oral TID WC  . loratadine  10 mg Oral Daily  . magnesium oxide  200 mg Oral Daily  . multivitamin with minerals  1 tablet Oral Daily  . polyethylene glycol  17 g Oral BID  . rivaroxaban  20 mg Oral Q2000  . tamsulosin  0.4 mg Oral Daily  . tiotropium  18 mcg Inhalation Daily    Continuous Infusions: . sodium chloride 10 mL/hr at 05/16/17 1437  . ertapenem [facility]) IV Stopped (05/18/17 0841)  . vancomycin 1,000 mg (05/19/17 0851)     LOS: 4 days    Time spent: over 30 min    Lacretia Nicks, MD [facility] Pager [phone]  If 7PM-7AM, please contact night-coverage [email or web address] Password [email or web address] 05/19/2017, 11:28 AM

## 2017-05-19 NOTE — Progress Notes (Signed)
*  PRELIMINARY RESULTS* Echocardiogram 2D Echocardiogram has been performed.  [name] 05/19/2017, 11:24 AM

## 2017-05-19 NOTE — Progress Notes (Signed)
Pharmacy Antibiotic Note  [name] is a 84 y.o. male admitted on 05/14/2017 with sepsis/osteomyelitis.  Pt came in on vanc and ertapemen that was started on 11/13 with instruction to finish on 06/07/17.  Pharmacy consulted to dose vancomycin.  Initially vanc trough was elevated and doses were held.  Vanc was resumed on 12/31.  Today pharmacy consulted to dose zosyn for wound infection.  Ertapenem has been d/c'd.  05/19/2017 Scr 0.94, CrCl ~ 36mls/min WBC 5.1 afebrile  Plan:  continue vancomycin at 1gm IV q24h  Obtain vanc trough in the am  Start Zosyn 3.375g IV Q8H infused over 4hrs.  Daily Scr while on vanc and zosyn  Continue to follow renal function and obtain vanc levels as needed   Height: 5\' 8"  (172.7 cm) Weight: 230 lb 3.2 oz (104.4 kg) IBW/kg (Calculated) : 68.4  Temp (24hrs), Avg:99.3 F (37.4 C), Min:98.5 F (36.9 C), Max:99.7 F (37.6 C)  Recent Labs  Lab 05/14/17 1316 05/14/17 1332 05/14/17 1457 05/14/17 1506 05/14/17 1720 05/15/17 0712 05/16/17 0539 05/17/17 0412 05/18/17 0414 05/19/17 0458  WBC 6.2  --   --   --   --  5.3  --   --   --  5.1  CREATININE 0.96  --   --   --   --  1.30* 1.19 0.90 0.82 0.94  LATICACIDVEN  --  0.36* 0.5 0.32*  --   --   --   --   --   --   VANCOTROUGH  --   --   --   --  28*  --   --   --   --   --   VANCORANDOM  --   --   --   --   --  23 18  --   --   --     Estimated Creatinine Clearance: 63.6 mL/min (by C-G formula based on SCr of 0.94 mg/dL).    Allergies  Allergen Reactions  . Nsaids     Stomach pain  . Vicodin [Hydrocodone-Acetaminophen] Other (See Comments)    Unknown reaction many years ago  . Morphine And Related Nausea And Vomiting    Dizziness, light headed. "Opiates cause tightness in chest".     Thank you for allowing pharmacy to be a part of this patient's care.   Arley Phenix RPh 05/19/2017, 1:03 PM Pager [phone]

## 2017-05-20 LAB — CBC
HCT: 30.8 % — ABNORMAL LOW (ref 39.0–52.0)
HEMOGLOBIN: 9.3 g/dL — AB (ref 13.0–17.0)
MCH: 29.3 pg (ref 26.0–34.0)
MCHC: 30.2 g/dL (ref 30.0–36.0)
MCV: 97.2 fL (ref 78.0–100.0)
Platelets: 235 10*3/uL (ref 150–400)
RBC: 3.17 MIL/uL — AB (ref 4.22–5.81)
RDW: 14.9 % (ref 11.5–15.5)
WBC: 6.1 10*3/uL (ref 4.0–10.5)

## 2017-05-20 LAB — CULTURE, BLOOD (ROUTINE X 2)
CULTURE: NO GROWTH
Culture: NO GROWTH
SPECIAL REQUESTS: ADEQUATE
Special Requests: ADEQUATE

## 2017-05-20 LAB — BASIC METABOLIC PANEL
ANION GAP: 8 (ref 5–15)
BUN: 27 mg/dL — ABNORMAL HIGH (ref 6–20)
CALCIUM: 8.6 mg/dL — AB (ref 8.9–10.3)
CO2: 44 mmol/L — ABNORMAL HIGH (ref 22–32)
Chloride: 89 mmol/L — ABNORMAL LOW (ref 101–111)
Creatinine, Ser: 0.91 mg/dL (ref 0.61–1.24)
Glucose, Bld: 104 mg/dL — ABNORMAL HIGH (ref 65–99)
Potassium: 3.8 mmol/L (ref 3.5–5.1)
Sodium: 141 mmol/L (ref 135–145)

## 2017-05-20 LAB — BLOOD GAS, VENOUS
Acid-Base Excess: 22.6 mmol/L — ABNORMAL HIGH (ref 0.0–2.0)
Bicarbonate: 50.5 mmol/L — ABNORMAL HIGH (ref 20.0–28.0)
O2 SAT: 73.4 %
PCO2 VEN: 78 mmHg — AB (ref 44.0–60.0)
Patient temperature: 37
pH, Ven: 7.427 (ref 7.250–7.430)
pO2, Ven: 40.6 mmHg (ref 32.0–45.0)

## 2017-05-20 LAB — VANCOMYCIN, TROUGH: VANCOMYCIN TR: 18 ug/mL (ref 15–20)

## 2017-05-20 LAB — VANCOMYCIN, PEAK: VANCOMYCIN PK: 38 ug/mL (ref 30–40)

## 2017-05-20 MED ORDER — CHLORHEXIDINE GLUCONATE CLOTH 2 % EX PADS
6.0000 | MEDICATED_PAD | Freq: Every day | CUTANEOUS | Status: DC
  Administered 2017-05-20 – 2017-05-28 (×8): 6 via TOPICAL

## 2017-05-20 MED ORDER — VANCOMYCIN HCL IN DEXTROSE 750-5 MG/150ML-% IV SOLN
750.0000 mg | INTRAVENOUS | Status: DC
  Administered 2017-05-21 – 2017-05-28 (×8): 750 mg via INTRAVENOUS
  Filled 2017-05-20 (×8): qty 150

## 2017-05-20 NOTE — Progress Notes (Signed)
Pt c/o Bipap mask. Pt said yes when asked if he was nauseated. Bipap mask removed and patient put on [place]. Asked again if he was nauseated. He said no. Will give patient a break from Bipap for now.

## 2017-05-20 NOTE — Progress Notes (Signed)
Pharmacy Antibiotic Note  [name] is a 84 y.o. male admitted on 05/14/2017 with sepsis/osteomyelitis.  Pt came in on vanc and ertapemen that was started on 11/13 with instruction to finish on 06/07/17.  Pharmacy consulted to dose vancomycin.  Initially vanc trough was elevated and doses were held.  Vanc was resumed on 12/31.  Zosyn given x 1 dose 1/3 then meropenem started.  Vancomycin peak and trough levels drawn today.   05/20/2017 Scr 0.91, CrCl ~ 66 mls/min WBC 6.1 afebrile  Plan: Continue Meropenem 1g IV q8h Decrease vancomycin to 750 mg IV q24 for AUC 494.5, est Cmax 29.7, Cmin 13.5 F/u ID recs  Height: 5\' 8"  (172.7 cm) Weight: 230 lb 3.2 oz (104.4 kg) IBW/kg (Calculated) : 68.4  Temp (24hrs), Avg:98 F (36.7 C), Min:96.9 F (36.1 C), Max:99.2 F (37.3 C)  Recent Labs  Lab 05/14/17 1316 05/14/17 1332 05/14/17 1457 05/14/17 1506 05/14/17 1720 05/15/17 0712 05/16/17 0539 05/17/17 0412 05/18/17 0414 05/19/17 0458 05/20/17 0340 05/20/17 0845 05/20/17 1059  WBC 6.2  --   --   --   --  5.3  --   --   --  5.1 6.1  --   --   CREATININE 0.96  --   --   --   --  1.30* 1.19 0.90 0.82 0.94 0.91  --   --   LATICACIDVEN  --  0.36* 0.5 0.32*  --   --   --   --   --   --   --   --   --   VANCOTROUGH  --   --   --   --  28*  --   --   --   --   --   --  18  --   VANCOPEAK  --   --   --   --   --   --   --   --   --   --   --   --  21  VANCORANDOM  --   --   --   --   --  23 18  --   --   --   --   --   --     Estimated Creatinine Clearance: 65.7 mL/min (by C-G formula based on SCr of 0.91 mg/dL).    Allergies  Allergen Reactions  . Nsaids     Stomach pain  . Vicodin [Hydrocodone-Acetaminophen] Other (See Comments)    Unknown reaction many years ago  . Morphine And Related Nausea And Vomiting    Dizziness, light headed. "Opiates cause tightness in chest".   Antimicrobials this admission:  (12/12 PTA )  vancomycin >>  (12/12 PTA)  ertapenem >> 1/3 1/3 zosyn x 1  dose 1/3 meropenem >>  Dose adjustments this admission:  12/29: VT at 1720: 28 mcg/ml, continue to hold vanc (was on 1gm q12h) 12/30 0600 VR: 23 mcg/ml, continue to hold vanc  12/31: 0600 VR: 83mcg/ml 1/4 VT 18 at 0845 1/4 VP 38 at 1059 Ke 0.0343, T1/2 20.2, Cmax 39, Cmin 18, Vd 44.  Microbiology results:  12/30 BCx: ngtd 12/30 UCx:  ngf 12/30 MRSA PCR: neg  12/5 Coccyx bone: MRSA + E.coli (S cefepime, imipenem, bactrim; R zosyn)  Thank you for allowing pharmacy to be a part of this patient's care.  Herby Abraham, Pharm.D. 05/20/2017 1:20 PM

## 2017-05-20 NOTE — Evaluation (Signed)
Clinical/Bedside Swallow Evaluation Patient Details  Name: [name] MRN: [id number] Date of Birth: Aug 18, 1926  Today's Date: 05/20/2017 Time: SLP Start Time (ACUTE ONLY): 0841 SLP Stop Time (ACUTE ONLY): 0925 SLP Time Calculation (min) (ACUTE ONLY): 44 min  Past Medical History:  Past Medical History:  Diagnosis Date  . A-fib (HCC)   . Acute systolic CHF (congestive heart failure), NYHA class 3 -- Unclear etilogy (? Afib related) EF down from 60-70% to 40%. 07/05/2012   Echo 2/21: LV upper limits of normal. EF- 40%. Cannot assess Diastolic function - Aortic Sclerosis, Mod-Severe Left Atrial dilation; Mild RV & RA dilation; Moderately elevated PA peak pressure: 43mm Hg     . Arthritis    knees  . Asthma   . ASTHMA 02/06/2007   Qualifier: Diagnosis of  By: Claiborne Billings CMA, Terance Ice    . BENIGN PROSTATIC HYPERTROPHY 05/13/2010   Qualifier: Diagnosis of  By: Amador Cunas  MD, Janett Labella   . Bifascicular block 12/18/2013  . BPH (benign prostatic hypertrophy)   . Bronchospasm, exercise-induced   . CHF (congestive heart failure) (HCC)    systolic, EF 40% (07/07/2012)  . Chronic kidney disease    stabilized, due to infection  . Chronic rhinitis 06/02/2009   Qualifier: Diagnosis of  By: Lovell Sheehan MD, Balinda Quails   . DJD (degenerative joint disease), lumbar 01/18/2012  . Dyslipidemia   . Edema    Bilateral - left lower leg greater than right- wears compression stockings  . General weakness 07/05/2012  . GERD (gastroesophageal reflux disease)   . History of elevated glucose 10/01/2009   Qualifier: Diagnosis of  By: Lovell Sheehan MD, Balinda Quails   . Hx of colonic polyps 11/02/2011  . Hypertension   . INSOMNIA, CHRONIC, MILD 01/02/2008   Qualifier: Diagnosis of  By: Lovell Sheehan MD, Balinda Quails   . Macular degeneration 08-06-13   bilateral -sight impaired  . Obesity   . Paroxysmal Atrial fibrillation - admitted with RVR 01/18/2012  . S/P left THA, AA 08/13/2013  . Shortness of breath    Past Surgical History:  Past  Surgical History:  Procedure Laterality Date  . CARDIOVERSION N/A 07/07/2012   Procedure: CARDIOVERSION;  Surgeon: Chrystie Nose, MD;  Location: [facility] ENDOSCOPY;  Service: Cardiovascular;  Laterality: N/A;  . CARDIOVERSION N/A 08/30/2012   Procedure: CARDIOVERSION;  Surgeon: Chrystie Nose, MD;  Location: [facility] ENDOSCOPY;  Service: Cardiovascular;  Laterality: N/A;  . IR FLUORO GUIDE CV LINE RIGHT  04/15/2017  . IR US GUIDE VASC ACCESS RIGHT  04/15/2017  . IRRIGATION AND DEBRIDEMENT ABSCESS N/A 02/08/2017   Procedure: IRRIGATION AND DEBRIDEMENT SACRAL ULCER;  Surgeon: Berna Bue, MD;  Location: [facility] ORS;  Service: General;  Laterality: N/A;  . TOTAL HIP ARTHROPLASTY Left 08/13/2013   Procedure: LEFT TOTAL HIP ARTHROPLASTY ANTERIOR APPROACH;  Surgeon: [name], MD;  Location: [facility] ORS;  Service: Orthopedics;  Laterality: Left;  . TOTAL KNEE ARTHROPLASTY Left 01/24/2017   Procedure: LEFT TOTAL KNEE ARTHROPLASTY;  Surgeon: Durene Romans, MD;  Location: [facility] ORS;  Service: Orthopedics;  Laterality: Left;  90 mins  . TRANSTHORACIC ECHOCARDIOGRAM  07/07/2012   ef 40%; mild MR; LA mod-severely dilated; RA mildly dilated; RV systolic function mildly reduced; RV systolic pressure increase - mod pulm htn  . VASCULAR SURGERY  removed varicose veins  . VASECTOMY     HPI:  84 y.o.malewith medical history ofpersistent atrial fibrillation, systolic and diastolic CHF, COPD, coccygeal osteomyelitis, hyperlipidemia presenting with fluctuating mental status since May 11, 2017. The patient was recently discharged from the hospital after a stay from 02/07/17 through 02/21/17 when he was treated for sepsis secondary to sacral osteomyelitis.  The patient was noted to have necrotic bone in his coccyx during wound care.  Bone biopsy and cultures were sent.  Over the last 3 days, the patient had episodes of somnolence and difficulty to arouse.  On the morning of 05/14/2017, the patient was difficult to arouse,  and was somnolent after he was awoken. Pt was transferred to hospital.  Swallow evaluation ordered.  Diet at SNF is regular/thin per chart review.     Assessment / Plan / Recommendation Clinical Impression  Assisted pt to brush his gums/tongue and educated pt/daughter [name] to importance of oral care.   Pt without indication of airway compromise with po observed - (cracker, pill, water, Ensure).  He did demonstrate cough x3 during session but did not appear coorelated to po intake.  He swished orally while swallowing tablet - resulting in tablet lodging in mouth - use of Ensure effective to transit.     Pt admits to occasionally choking with intake and needing to burp frequently. He however denies reflux symptoms.  Also noted coating in superior surface of tongue - ? if could be consistent with oral candidiasis.  Pt with increased work of breathing during intake which daughter states is normal for him.  As pt is deconditioned and has increased WOB, recommend continue diet consuming softer foods.  Will follow up x1 to assure tolerance and for family continued education.   SLP Visit Diagnosis: Dysphagia, unspecified (R13.10)    Aspiration Risk  Moderate aspiration risk    Diet Recommendation Dysphagia 3 (Mech soft);Thin liquid   Liquid Administration via: Cup;Straw Medication Administration: (prefer with Ensure) Supervision: Patient able to self feed Compensations: Slow rate;Small sips/bites(start meals with liquids) Postural Changes: Seated upright at 90 degrees    Other  Recommendations Oral Care Recommendations: Oral care QID   Follow up Recommendations Skilled Nursing facility      Frequency and Duration min 1 x/week  1 week       Prognosis Prognosis for Safe Diet Advancement: Fair Barriers to Reach Goals: Time post onset      Swallow Study   General Date of Onset: 05/20/17 HPI: 84 y.o.malewith medical history ofpersistent atrial fibrillation, systolic and diastolic CHF,  COPD, coccygeal osteomyelitis, hyperlipidemia presenting with fluctuating mental status since May 11, 2017. The patient was recently discharged from the hospital after a stay from 02/07/17 through 02/21/17 when he was treated for sepsis secondary to sacral osteomyelitis.  The patient was noted to have necrotic bone in his coccyx during wound care.  Bone biopsy and cultures were sent.  Over the last 3 days, the patient had episodes of somnolence and difficulty to arouse.  On the morning of 05/14/2017, the patient was difficult to arouse, and was somnolent after he was awoken. Pt was transferred to hospital.  Swallow evaluation ordered.  Diet at SNF is regular/thin per chart review.   Type of Study: Bedside Swallow Evaluation Diet Prior to this Study: Regular;Thin liquids Temperature Spikes Noted: No Respiratory Status: Nasal cannula History of Recent Intubation: No Behavior/Cognition: Alert;Requires cueing Oral Cavity Assessment: (xerostomia-coating on tongue-? oral candidiasis) Oral Care Completed by SLP: Yes Oral Cavity - Dentition: Dentures, top;Dentures, bottom Vision: (pt has macular degeneration) Self-Feeding Abilities: Needs assist Patient Positioning: Upright in bed Baseline Vocal Quality: Low vocal intensity Volitional Cough: Strong(ot as strong as normal per daughter [name])  Volitional Swallow: Able to elicit    Oral/Motor/Sensory Function Overall Oral Motor/Sensory Function: Generalized oral weakness   Ice Chips Ice chips: Not tested   Thin Liquid Thin Liquid: Within functional limits Presentation: Self Fed;Straw    Nectar Thick Nectar Thick Liquid: Within functional limits Presentation: Straw;Self Fed   Honey Thick Honey Thick Liquid: Not tested   Puree Puree: Not tested   Solid   GO   Other Comments: slow but effective mastication without significant oral residuals        Chales Abrahams 05/20/2017,9:47 AM  Donavan Burnet, MS [facility] SLP [phone]

## 2017-05-20 NOTE — Progress Notes (Signed)
PROGRESS NOTE    [name]  ZOX:[id number] DOB: [birth year] DOA: 05/14/2017 PCP: [name], MD   Brief Narrative: ( 84 y.o.malewith medical history ofpersistent atrial fibrillation, systolic and diastolic CHF, COPD, coccygeal osteomyelitis, hyperlipidemia presenting with fluctuating mental status since May 11, 2017. The patient was recently discharged from the hospital after [name] stay from 02/07/17 through 02/21/17 when he was treated for sepsis secondary to sacral osteomyelitis. [name] PICC line was placed, and the patient was discharged with antibiotics to finish on 03/22/2017. His PICC line was subsequently removed after he finished his antibiotics. He continued to receive wound care. During the wound care visit on 04/20/2017, the patient was noted to have necrotic bone in his coccyx. Bone biopsy and cultures were sent and were consistent with osteomyelitis. Bone cultures grew group B streptococcus, diphtheroids, E. coli, and MRSA Jazari Ober PICC line was placed on 03/29/2017, the patient was started on vancomycin and ertapenem With instructions to finish on 06/07/2017.The patient was doing fairly well up until the last 3 days when his family noted that he has been having intermittent episodes of somnolence and difficulty to arouse. In between these episodes, the patient will be fully lucid and able to speak with clarity. On the morning of 05/14/2017, the patient was difficult to arouse, and was somnolent after he was awoken. As result, the patient was transferred to the emergency department for further evaluation  Assessment & Plan:   Active Problems:   Hypotension   Osteomyelitis of coccyx (HCC)   Acute encephalopathy   Permanent atrial fibrillation (HCC)   Chronic respiratory failure with hypoxia (HCC)   Chronic combined systolic and diastolic CHF (congestive heart failure) (HCC)  Acute encephalopathy - appears to be multifactorial and secondary to possible UTI (UA with  WBC's, RBC's, bacteria, but urine cx no growth), there was also question of possible seizure secondary to ertapenem and tramadol - hypercarbia may be playing [name] role (despite normal pH) -> pH and CO2 improved today, will plan for nightly bipap going forward.  Of note, pt with normal PCO2 [name] few months ago.   - EEG was done and was notable for mild to moderate global slowing indicating global encephalopathy, typically seen in toxic metabolic process  - continues to be intermittently confused -> this morning seemed [name] bit clearer, [name]&Ox3 - pt was started on Ertapenem and Vancomycin by ID team -> discussed with Dr. Drue Second from ID regarding whether or not these abx could be contributing.  After discussion with ID initially planned to trial pt on zosyn and d/c ertapenem (but after review of cultures, e. Coli was resistant to zosyn.  Will start meropenem instead (had discussed this with ID as well).  Will touch base with ID again today. - foley cath was changed on 05/13/2017  Coccygeal osteomyelitis - Bone biopsy and culture 04/20/2017 - Bone cultures grew group B streptococcus, diphtheroids, E. coli, and MRSA (e. Coli sensitive to cefepime, imipenem, bactrim) - PICC line was placed on 03/29/2017, the patient was started on vancomycin and ertapenem with instructions to finish on 06/07/2017 - plan on consulting with ID regarding continuation of ABX (as above)  Chronic respiratory failure with hypoxia  Hypercarbia: - trial bipap at night - intermittently on oxygen 1-2 L at SNF - CXR with evidence of HF, lasix as below  Acute on Chronic systolic and diastolic CHF - 02/21/2017 discharge weight 203 lbs - cont home regimen with Bumex -> transition to IV lasix 60 mg BID - ECHO 09/04/2015  with EF 50-55% and grade I diastolic CHF - monitor daily weights and I/O's - this is the weight trend since admission - ECHO pending  Filed Weights   05/16/17 0558 05/17/17 0552 05/18/17 0700  Weight: 96.2 kg (212 lb 1.3  oz) 105.7 kg (233 lb 1.6 oz) 104.4 kg (230 lb 3.2 oz)   Hypotension: chronic, on midorine as outpatient.  Continue this.  Permanent atrial fibrillation - rate has been controlled - continue Rivaroxaban and amiodarone   COPD - Continue Spiriva - no wheezing on exam   Hyperlipidemia - continue statin   Obesity  - Body mass index is 35.3 kg/m  Penile edema - improved - pt seen by urology  - Foley cath replaced  Acute kidney injury - resolved with hydration - weight is up and will therefore stop IVF   DVT prophylaxis: xarelto Code Status: partial, CPR, no intubation Family Communication: daugther, discussed plan and transfer to stepdown for bipap trial Disposition Plan: pending improvement   Consultants:   ID phone  Procedures:   none  Antimicrobials:  Anti-infectives (From admission, onward)   Start     Dose/Rate Route Frequency Ordered Stop   05/20/17 1200  meropenem (MERREM) 1 g in sodium chloride 0.9 % 100 mL IVPB     1 g 200 mL/hr over 30 Minutes Intravenous Every 8 hours 05/19/17 1847     05/19/17 1215  piperacillin-tazobactam (ZOSYN) IVPB 3.375 g  Status:  Discontinued     3.375 g 12.5 mL/hr over 240 Minutes Intravenous Every 8 hours 05/19/17 1202 05/19/17 1836   05/16/17 0800  vancomycin (VANCOCIN) IVPB 1000 mg/200 mL premix     1,000 mg 200 mL/hr over 60 Minutes Intravenous Every 24 hours 05/16/17 0728     05/15/17 0800  ertapenem (INVANZ) 1 g in sodium chloride 0.9 % 50 mL IVPB  Status:  Discontinued     1 g 100 mL/hr over 30 Minutes Intravenous Every 24 hours 05/14/17 2050 05/19/17 1150   05/14/17 1715  ceFEPIme (MAXIPIME) 2 g in dextrose 5 % 50 mL IVPB     2 g 100 mL/hr over 30 Minutes Intravenous  Once 05/14/17 1701 05/14/17 1941   05/14/17 1715  vancomycin (VANCOCIN) 1,500 mg in sodium chloride 0.9 % 500 mL IVPB  Status:  Discontinued     1,500 mg 250 mL/hr over 120 Minutes Intravenous  Once 05/14/17 1706 05/14/17 1721         Subjective: [name]&Ox3. Seems to be doing better this morning.  Doesn't seem as confused today. "I feel with my hands" (made this joke yesterday as well)  Objective: Vitals:   05/20/17 0400 05/20/17 0500 05/20/17 0600 05/20/17 0700  BP: (!) 104/45 (!) 100/34 93/64 (!) 99/34  Pulse: 71 68 72 69  Resp: (!) 23 (!) 28 (!) 24 (!) 22  Temp:      TempSrc:      SpO2: 94% 98% 97% 92%  Weight:      Height:        Intake/Output Summary (Last 24 hours) at 05/20/2017 0756 Last data filed at 05/19/2017 1845 Gross per 24 hour  Intake 240 ml  Output 1400 ml  Net -1160 ml   Filed Weights   05/16/17 0558 05/17/17 0552 05/18/17 0700  Weight: 96.2 kg (212 lb 1.3 oz) 105.7 kg (233 lb 1.6 oz) 104.4 kg (230 lb 3.2 oz)    Examination:  General: No acute distress. Cardiovascular: Heart sounds show Raghad Lorenz regular rate, and rhythm. No gallops  or rubs. No murmurs. No JVD. Lungs: Clear to auscultation bilaterally with good air movement. No rales, rhonchi or wheezes. Abdomen: Soft, nontender, nondistended with normal active bowel sounds. No masses. No hepatosplenomegaly. Neurological: Alert and oriented 3. Moves all extremities 4. Skin: Did not visualize sacral ulcer (discussed with nursing to call during dressing change - recently had dressing change before I arrived). Extremities: No clubbing or cyanosis. 1+ nonpitting edema. Psychiatric: Mood and affect are normal. Insight and judgment are appropriate.  Data Reviewed: I have personally reviewed following labs and imaging studies  CBC: Recent Labs  Lab 05/14/17 1316 05/15/17 0712 05/19/17 0458 05/20/17 0340  WBC 6.2 5.3 5.1 6.1  NEUTROABS 3.8  --   --   --   HGB 9.0* 9.3* 9.5* 9.3*  HCT 29.5* 30.9* 32.5* 30.8*  MCV 96.4 96.6 100.0 97.2  PLT 234 226 228 235   Basic Metabolic Panel: Recent Labs  Lab 05/14/17 2105  05/16/17 0539 05/17/17 0412 05/18/17 0414 05/19/17 0458 05/20/17 0340  NA  --    < > 139 139 140 143 141  K  --    < >  4.2 4.2 3.7 4.2 3.8  CL  --    < > 102 102 97* 96* 89*  CO2  --    < > 33* 34* 39* 41* 44*  GLUCOSE  --    < > 106* 113* 104* 102* 104*  BUN  --    < > 43* 30* 23* 20 27*  CREATININE  --    < > 1.19 0.90 0.82 0.94 0.91  CALCIUM  --    < > 8.5* 8.7* 8.4* 8.7* 8.6*  MG 2.0  --   --   --   --   --   --    < > = values in this interval not displayed.   GFR: Estimated Creatinine Clearance: 65.7 mL/min (by C-G formula based on SCr of 0.91 mg/dL). Liver Function Tests: Recent Labs  Lab 05/14/17 1316  AST 30  ALT 20  ALKPHOS 80  BILITOT 0.6  PROT 5.7*  ALBUMIN 2.5*   No results for input(s): LIPASE, AMYLASE in the last 168 hours. Recent Labs  Lab 05/14/17 2105  AMMONIA 27   Coagulation Profile: No results for input(s): INR, PROTIME in the last 168 hours. Cardiac Enzymes: No results for input(s): CKTOTAL, CKMB, CKMBINDEX, TROPONINI in the last 168 hours. BNP (last 3 results) No results for input(s): PROBNP in the last 8760 hours. HbA1C: No results for input(s): HGBA1C in the last 72 hours. CBG: No results for input(s): GLUCAP in the last 168 hours. Lipid Profile: No results for input(s): CHOL, HDL, LDLCALC, TRIG, CHOLHDL, LDLDIRECT in the last 72 hours. Thyroid Function Tests: No results for input(s): TSH, T4TOTAL, FREET4, T3FREE, THYROIDAB in the last 72 hours. Anemia Panel: No results for input(s): VITAMINB12, FOLATE, FERRITIN, TIBC, IRON, RETICCTPCT in the last 72 hours. Sepsis Labs: Recent Labs  Lab 05/14/17 1332 05/14/17 1457 05/14/17 1506 05/14/17 2105  PROCALCITON  --   --   --  <0.10  LATICACIDVEN 0.36* 0.5 0.32*  --     Recent Results (from the past 240 hour(s))  Culture, blood (routine x 2) Call MD if unable to obtain prior to antibiotics being given     Status: None (Preliminary result)   Collection Time: 05/14/17  5:01 PM  Result Value Ref Range Status   Specimen Description BLOOD PICC LINE  Final   Special Requests   Final  BOTTLES DRAWN AEROBIC AND  ANAEROBIC Blood Culture adequate volume   Culture   Final    NO GROWTH 4 DAYS Performed at [facility], [address] [place], [place] [address]    Report Status PENDING  Incomplete  Culture, blood (routine x 2) Call MD if unable to obtain prior to antibiotics being given     Status: None (Preliminary result)   Collection Time: 05/14/17  5:06 PM  Result Value Ref Range Status   Specimen Description LEFT ANTECUBITAL  Final   Special Requests   Final    BOTTLES DRAWN AEROBIC AND ANAEROBIC Blood Culture adequate volume   Culture   Final    NO GROWTH 4 DAYS Performed at [facility], [address] [place], [place] [address]    Report Status PENDING  Incomplete  Culture, Urine     Status: None   Collection Time: 05/15/17  6:52 AM  Result Value Ref Range Status   Specimen Description URINE, RANDOM  Final   Special Requests NONE  Final   Culture   Final    NO GROWTH Performed at [facility], [address] [place], [place] [address]    Report Status 05/16/2017 FINAL  Final  MRSA PCR Screening     Status: None   Collection Time: 05/16/17  2:15 AM  Result Value Ref Range Status   MRSA by PCR NEGATIVE NEGATIVE Final    Comment:        The GeneXpert MRSA Assay (FDA approved for NASAL specimens only), is one component of [facility] Mongiello comprehensive MRSA colonization surveillance program. It is not intended to diagnose MRSA infection nor to guide or monitor treatment for MRSA infections.          Radiology Studies: Dg Chest 1 View  Result Date: 05/19/2017 CLINICAL DATA:  Shortness of breath . EXAM: CHEST 1 VIEW COMPARISON:  05/14/2017 . FINDINGS: Right PICC line noted in stable position. Cardiomegaly with pulmonary interstitial prominence and bilateral pleural effusions. Findings consistent with CHF . Findings have progressed prior exam. No pneumothorax . IMPRESSION: 1.  Right PICC line in stable position. 2. Congestive heart failure with bilateral pulmonary  interstitial edema bilateral pleural effusions. Electronically Signed   By: Maisie Fus  Register   On: 05/19/2017 08:31        Scheduled Meds: . acetaminophen  1,000 mg Oral Q8H  . albuterol  2.5 mg Nebulization BID  . amiodarone  200 mg Oral Daily  . docusate sodium  100 mg Oral BID  . feeding supplement (ENSURE ENLIVE)  237 mL Oral TID BM  . ferrous sulfate  325 mg Oral TID WC  . furosemide  60 mg Intravenous Q12H  . loratadine  10 mg Oral Daily  . magnesium oxide  200 mg Oral Daily  . multivitamin with minerals  1 tablet Oral Daily  . polyethylene glycol  17 g Oral BID  . rivaroxaban  20 mg Oral Q2000  . tamsulosin  0.4 mg Oral Daily  . tiotropium  18 mcg Inhalation Daily   Continuous Infusions: . sodium chloride 10 mL/hr at 05/16/17 1437  . meropenem (MERREM) IV    . vancomycin Stopped (05/19/17 0955)     LOS: 5 days    Time spent: over 30 min    Lacretia Nicks, MD [facility] Pager [phone]  If 7PM-7AM, please contact night-coverage [email or web address] Password [facility] 05/20/2017, 7:56 AM

## 2017-05-21 LAB — CBC
HEMATOCRIT: 32 % — AB (ref 39.0–52.0)
HEMOGLOBIN: 9.6 g/dL — AB (ref 13.0–17.0)
MCH: 28.7 pg (ref 26.0–34.0)
MCHC: 30 g/dL (ref 30.0–36.0)
MCV: 95.8 fL (ref 78.0–100.0)
Platelets: 248 10*3/uL (ref 150–400)
RBC: 3.34 MIL/uL — ABNORMAL LOW (ref 4.22–5.81)
RDW: 14.9 % (ref 11.5–15.5)
WBC: 7.2 10*3/uL (ref 4.0–10.5)

## 2017-05-21 LAB — MAGNESIUM: MAGNESIUM: 1.9 mg/dL (ref 1.7–2.4)

## 2017-05-21 LAB — BASIC METABOLIC PANEL
ANION GAP: 7 (ref 5–15)
BUN: 24 mg/dL — ABNORMAL HIGH (ref 6–20)
CALCIUM: 8.4 mg/dL — AB (ref 8.9–10.3)
CHLORIDE: 87 mmol/L — AB (ref 101–111)
CO2: 46 mmol/L — AB (ref 22–32)
Creatinine, Ser: 0.88 mg/dL (ref 0.61–1.24)
GFR calc non Af Amer: >60 mL/min (ref >60)
Glucose, Bld: 113 mg/dL — ABNORMAL HIGH (ref 65–99)
Potassium: 3.2 mmol/L — ABNORMAL LOW (ref 3.5–5.1)
SODIUM: 140 mmol/L (ref 135–145)

## 2017-05-21 MED ORDER — POTASSIUM CHLORIDE CRYS ER 20 MEQ PO TBCR
40.0000 meq | EXTENDED_RELEASE_TABLET | Freq: Once | ORAL | Status: AC
  Administered 2017-05-21: 40 meq via ORAL
  Filled 2017-05-21: qty 2

## 2017-05-21 NOTE — Progress Notes (Signed)
PROGRESS NOTE    [name] Endo  PVX:[id number] DOB: 06-Oct-1926 DOA: 05/14/2017 PCP: [name], MD   Brief Narrative: ( 84 y.o.malewith medical history ofpersistent atrial fibrillation, systolic and diastolic CHF, COPD, coccygeal osteomyelitis, hyperlipidemia presenting with fluctuating mental status since May 11, 2017. The patient was recently discharged from the hospital after [name] stay from 02/07/17 through 02/21/17 when he was treated for sepsis secondary to sacral osteomyelitis. [name] PICC line was placed, and the patient was discharged with antibiotics to finish on 03/22/2017. His PICC line was subsequently removed after he finished his antibiotics. He continued to receive wound care. During the wound care visit on 04/20/2017, the patient was noted to have necrotic bone in his coccyx. Bone biopsy and cultures were sent and were consistent with osteomyelitis. Bone cultures grew group B streptococcus, diphtheroids, E. coli, and MRSA [name] PICC line was placed on 03/29/2017, the patient was started on vancomycin and ertapenem With instructions to finish on 06/07/2017.The patient was doing fairly well up until the last 3 days when his family noted that he has been having intermittent episodes of somnolence and difficulty to arouse. In between these episodes, the patient will be fully lucid and able to speak with clarity. On the morning of 05/14/2017, the patient was difficult to arouse, and was somnolent after he was awoken. As result, the patient was transferred to the emergency department for further evaluation  Assessment & Plan:   Active Problems:   Hypotension   Osteomyelitis of coccyx (HCC)   Acute encephalopathy   Permanent atrial fibrillation (HCC)   Chronic respiratory failure with hypoxia (HCC)   Chronic combined systolic and diastolic CHF (congestive heart failure) (HCC)  Acute encephalopathy - Improving with nightly bipap (and change in abx, though suspect bipap is  what is helping) - appears to be multifactorial and secondary to possible UTI (UA with WBC's, RBC's, bacteria, but urine cx no growth), there was also question of possible seizure secondary to ertapenem and tramadol - hypercarbia may be playing [name] role (despite normal pH) -> pH and CO2 improved today, will plan for nightly bipap going forward.  Of note, pt with normal PCO2 [name] few months ago.   - EEG was done and was notable for mild to moderate global slowing indicating global encephalopathy, typically seen in toxic metabolic process  - continues to be intermittently confused -> this morning seemed [name] bit clearer, [name]&Ox3 - pt was started on Ertapenem and Vancomycin by ID team -> discussed with Dr. Drue Second from ID regarding whether or not these abx could be contributing.  After discussion with ID initially planned to trial pt on zosyn and d/c ertapenem (but after review of cultures, e. Coli was resistant to zosyn.  Will start meropenem instead (had discussed this with ID as well).  Will touch base with ID again today. - foley cath was changed on 05/13/2017  Coccygeal osteomyelitis - Bone biopsy and culture 04/20/2017 - Bone cultures grew group B streptococcus, diphtheroids, E. coli, and MRSA (e. Coli sensitive to cefepime, imipenem, bactrim) - PICC line was placed on 03/29/2017, the patient was started on vancomycin and ertapenem with instructions to finish on 06/07/2017 - plan on consulting with ID regarding continuation of ABX (as above)  Chronic respiratory failure with hypoxia  Hypercarbia: - trial bipap at night - intermittently on oxygen 1-2 L at SNF - CXR with evidence of HF, lasix as below  Acute on Chronic systolic and diastolic CHF - 02/21/2017 discharge weight 203 lbs -  cont home regimen with Bumex -> transition to IV lasix 60 mg BID - ECHO 09/04/2015 with EF 50-55% and grade I diastolic CHF - monitor daily weights and I/O's - this is the weight trend since admission - ECHO  pending  Filed Weights   05/18/17 0700 05/20/17 2130 05/21/17 0500  Weight: 104.4 kg (230 lb 3.2 oz) 94.7 kg (208 lb 12.4 oz) 94.2 kg (207 lb 10.8 oz)   Hypotension: chronic, on midorine as outpatient.  Continue this.  Permanent atrial fibrillation - rate has been controlled - continue Rivaroxaban and amiodarone   COPD - Continue Spiriva - no wheezing on exam   Hyperlipidemia - continue statin   Obesity  - Body mass index is 35.3 kg/m  Penile edema - improved - pt seen by urology  - Foley cath replaced  Acute kidney injury - resolved with hydration - weight is up and will therefore stop IVF   DVT prophylaxis: xarelto Code Status: partial, CPR, no intubation Family Communication: daugther and son Disposition Plan: pending improvement   Consultants:   ID phone  Procedures:   none  Antimicrobials:  Anti-infectives (From admission, onward)   Start     Dose/Rate Route Frequency Ordered Stop   05/21/17 0800  vancomycin (VANCOCIN) IVPB 750 mg/150 ml premix     750 mg 150 mL/hr over 60 Minutes Intravenous Every 24 hours 05/20/17 1321     05/20/17 1200  meropenem (MERREM) 1 g in sodium chloride 0.9 % 100 mL IVPB     1 g 200 mL/hr over 30 Minutes Intravenous Every 8 hours 05/19/17 1847     05/19/17 1215  piperacillin-tazobactam (ZOSYN) IVPB 3.375 g  Status:  Discontinued     3.375 g 12.5 mL/hr over 240 Minutes Intravenous Every 8 hours 05/19/17 1202 05/19/17 1836   05/16/17 0800  vancomycin (VANCOCIN) IVPB 1000 mg/200 mL premix  Status:  Discontinued     1,000 mg 200 mL/hr over 60 Minutes Intravenous Every 24 hours 05/16/17 0728 05/20/17 1321   05/15/17 0800  ertapenem (INVANZ) 1 g in sodium chloride 0.9 % 50 mL IVPB  Status:  Discontinued     1 g 100 mL/hr over 30 Minutes Intravenous Every 24 hours 05/14/17 2050 05/19/17 1150   05/14/17 1715  ceFEPIme (MAXIPIME) 2 g in dextrose 5 % 50 mL IVPB     2 g 100 mL/hr over 30 Minutes Intravenous  Once 05/14/17  1701 05/14/17 1941   05/14/17 1715  vancomycin (VANCOCIN) 1,500 mg in sodium chloride 0.9 % 500 mL IVPB  Status:  Discontinued     1,500 mg 250 mL/hr over 120 Minutes Intravenous  Once 05/14/17 1706 05/14/17 1721        Subjective: Pain in bottom. Otherwise doing well, alert.    Objective: Vitals:   05/20/17 2136 05/21/17 0500 05/21/17 1137 05/21/17 1500  BP: 112/65 (!) 113/54  (!) 102/59  Pulse: 78 73  75  Resp: 20 (!) 23  20  Temp: 98.1 F (36.7 C) 98.1 F (36.7 C)  97.7 F (36.5 C)  TempSrc: Oral Oral  Oral  SpO2: 95% 91% 90% 93%  Weight:  94.2 kg (207 lb 10.8 oz)    Height:        Intake/Output Summary (Last 24 hours) at 05/21/2017 2014 Last data filed at 05/21/2017 1500 Gross per 24 hour  Intake 370 ml  Output 2000 ml  Net -1630 ml   Filed Weights   05/18/17 0700 05/20/17 2130 05/21/17 0500  Weight:  104.4 kg (230 lb 3.2 oz) 94.7 kg (208 lb 12.4 oz) 94.2 kg (207 lb 10.8 oz)    Examination:  General: No acute distress. Cardiovascular: Heart sounds show [name] regular rate, and rhythm. No gallops or rubs. No murmurs. No JVD. Lungs: Clear to auscultation bilaterally with good air movement. No rales, rhonchi or wheezes. Abdomen: Soft, nontender, nondistended with normal active bowel sounds. No masses. No hepatosplenomegaly. Neurological: Alert and oriented 3. Cranial nerves II through XII grossly intact. Skin: stage 4 decub visualized, no purulent discharge Extremities: No clubbing or cyanosis.1+ edema Psychiatric: Mood and affect are normal. Insight and judgment are appropriate.   Data Reviewed: I have personally reviewed following labs and imaging studies  CBC: Recent Labs  Lab 05/15/17 0712 05/19/17 0458 05/20/17 0340 05/21/17 0503  WBC 5.3 5.1 6.1 7.2  HGB 9.3* 9.5* 9.3* 9.6*  HCT 30.9* 32.5* 30.8* 32.0*  MCV 96.6 100.0 97.2 95.8  PLT 226 228 235 248   Basic Metabolic Panel: Recent Labs  Lab 05/14/17 2105  05/17/17 0412 05/18/17 0414 05/19/17 0458  05/20/17 0340 05/21/17 0503  NA  --    < > 139 140 143 141 140  K  --    < > 4.2 3.7 4.2 3.8 3.2*  CL  --    < > 102 97* 96* 89* 87*  CO2  --    < > 34* 39* 41* 44* 46*  GLUCOSE  --    < > 113* 104* 102* 104* 113*  BUN  --    < > 30* 23* 20 27* 24*  CREATININE  --    < > 0.90 0.82 0.94 0.91 0.88  CALCIUM  --    < > 8.7* 8.4* 8.7* 8.6* 8.4*  MG 2.0  --   --   --   --   --  1.9   < > = values in this interval not displayed.   GFR: Estimated Creatinine Clearance: 64.6 mL/min (by C-G formula based on SCr of 0.88 mg/dL). Liver Function Tests: No results for input(s): AST, ALT, ALKPHOS, BILITOT, PROT, ALBUMIN in the last 168 hours. No results for input(s): LIPASE, AMYLASE in the last 168 hours. Recent Labs  Lab 05/14/17 2105  AMMONIA 27   Coagulation Profile: No results for input(s): INR, PROTIME in the last 168 hours. Cardiac Enzymes: No results for input(s): CKTOTAL, CKMB, CKMBINDEX, TROPONINI in the last 168 hours. BNP (last 3 results) No results for input(s): PROBNP in the last 8760 hours. HbA1C: No results for input(s): HGBA1C in the last 72 hours. CBG: No results for input(s): GLUCAP in the last 168 hours. Lipid Profile: No results for input(s): CHOL, HDL, LDLCALC, TRIG, CHOLHDL, LDLDIRECT in the last 72 hours. Thyroid Function Tests: No results for input(s): TSH, T4TOTAL, FREET4, T3FREE, THYROIDAB in the last 72 hours. Anemia Panel: No results for input(s): VITAMINB12, FOLATE, FERRITIN, TIBC, IRON, RETICCTPCT in the last 72 hours. Sepsis Labs: Recent Labs  Lab 05/14/17 2105  PROCALCITON <0.10    Recent Results (from the past 240 hour(s))  Culture, blood (routine x 2) Call MD if unable to obtain prior to antibiotics being given     Status: None   Collection Time: 05/14/17  5:01 PM  Result Value Ref Range Status   Specimen Description BLOOD PICC LINE  Final   Special Requests   Final    BOTTLES DRAWN AEROBIC AND ANAEROBIC Blood Culture adequate volume   Culture    Final    NO GROWTH  5 DAYS Performed at [facility], [address], [place], [place] [address]    Report Status 05/20/2017 FINAL  Final  Culture, blood (routine x 2) Call MD if unable to obtain prior to antibiotics being given     Status: None   Collection Time: 05/14/17  5:06 PM  Result Value Ref Range Status   Specimen Description LEFT ANTECUBITAL  Final   Special Requests   Final    BOTTLES DRAWN AEROBIC AND ANAEROBIC Blood Culture adequate volume   Culture   Final    NO GROWTH 5 DAYS Performed at [facility], [address] [place], [place] [address]    Report Status 05/20/2017 FINAL  Final  Culture, Urine     Status: None   Collection Time: 05/15/17  6:52 AM  Result Value Ref Range Status   Specimen Description URINE, RANDOM  Final   Special Requests NONE  Final   Culture   Final    NO GROWTH Performed at [facility], [address], [place], [place] [address]    Report Status 05/16/2017 FINAL  Final  MRSA PCR Screening     Status: None   Collection Time: 05/16/17  2:15 AM  Result Value Ref Range Status   MRSA by PCR NEGATIVE NEGATIVE Final    Comment:        The GeneXpert MRSA Assay (FDA approved for NASAL specimens only), is one component of [name] comprehensive MRSA colonization surveillance program. It is not intended to diagnose MRSA infection nor to guide or monitor treatment for MRSA infections.          Radiology Studies: No results found.      Scheduled Meds: . acetaminophen  1,000 mg Oral Q8H  . albuterol  2.5 mg Nebulization BID  . amiodarone  200 mg Oral Daily  . Chlorhexidine Gluconate Cloth  6 each Topical Daily  . docusate sodium  100 mg Oral BID  . feeding supplement (ENSURE ENLIVE)  237 mL Oral TID BM  . ferrous sulfate  325 mg Oral TID WC  . furosemide  60 mg Intravenous Q12H  . loratadine  10 mg Oral Daily  . magnesium oxide  200 mg Oral Daily  . multivitamin with minerals  1 tablet Oral Daily  . polyethylene  glycol  17 g Oral BID  . rivaroxaban  20 mg Oral Q2000  . tamsulosin  0.4 mg Oral Daily  . tiotropium  18 mcg Inhalation Daily   Continuous Infusions: . sodium chloride 10 mL/hr at 05/16/17 1437  . meropenem (MERREM) IV Stopped (05/21/17 1236)  . vancomycin Stopped (05/21/17 0917)     LOS: 6 days    Time spent: over 30 min    [name] Nicks, MD [facility] Pager [phone]  If 7PM-7AM, please contact night-coverage [email or web address] Password [email or web address] 05/21/2017, 8:14 PM

## 2017-05-21 NOTE — Progress Notes (Signed)
CRITICAL VALUE ALERT  Critical lab value -- Venous blood gas - Co2 = 78.2  Date & Time Notied:  0520 Provider Notified: yes - oncall [facility]  Orders Received/Actions taken: PENDING

## 2017-05-22 LAB — BASIC METABOLIC PANEL
Anion gap: 9 (ref 5–15)
BUN: 26 mg/dL — AB (ref 6–20)
CALCIUM: 8.6 mg/dL — AB (ref 8.9–10.3)
CO2: 47 mmol/L — AB (ref 22–32)
CREATININE: 0.89 mg/dL (ref 0.61–1.24)
Chloride: 84 mmol/L — ABNORMAL LOW (ref 101–111)
GFR calc non Af Amer: >60 mL/min (ref >60)
Glucose, Bld: 100 mg/dL — ABNORMAL HIGH (ref 65–99)
POTASSIUM: 3.5 mmol/L (ref 3.5–5.1)
Sodium: 140 mmol/L (ref 135–145)

## 2017-05-22 LAB — CBC
HEMATOCRIT: 30 % — AB (ref 39.0–52.0)
Hemoglobin: 9.3 g/dL — ABNORMAL LOW (ref 13.0–17.0)
MCH: 29.2 pg (ref 26.0–34.0)
MCHC: 31 g/dL (ref 30.0–36.0)
MCV: 94 fL (ref 78.0–100.0)
PLATELETS: 271 10*3/uL (ref 150–400)
RBC: 3.19 MIL/uL — AB (ref 4.22–5.81)
RDW: 15.1 % (ref 11.5–15.5)
WBC: 7.8 10*3/uL (ref 4.0–10.5)

## 2017-05-22 NOTE — Progress Notes (Signed)
PROGRESS NOTE    [name] Endo  PVX:[id number] DOB: 06-Oct-1926 DOA: 05/14/2017 PCP: [name], MD   Brief Narrative: ( 84 y.o.malewith medical history ofpersistent atrial fibrillation, systolic and diastolic CHF, COPD, coccygeal osteomyelitis, hyperlipidemia presenting with fluctuating mental status since May 11, 2017. The patient was recently discharged from the hospital after [name] stay from 02/07/17 through 02/21/17 when he was treated for sepsis secondary to sacral osteomyelitis. [name] PICC line was placed, and the patient was discharged with antibiotics to finish on 03/22/2017. His PICC line was subsequently removed after he finished his antibiotics. He continued to receive wound care. During the wound care visit on 04/20/2017, the patient was noted to have necrotic bone in his coccyx. Bone biopsy and cultures were sent and were consistent with osteomyelitis. Bone cultures grew group B streptococcus, diphtheroids, E. coli, and MRSA [name] PICC line was placed on 03/29/2017, the patient was started on vancomycin and ertapenem With instructions to finish on 06/07/2017.The patient was doing fairly well up until the last 3 days when his family noted that he has been having intermittent episodes of somnolence and difficulty to arouse. In between these episodes, the patient will be fully lucid and able to speak with clarity. On the morning of 05/14/2017, the patient was difficult to arouse, and was somnolent after he was awoken. As result, the patient was transferred to the emergency department for further evaluation  Assessment & Plan:   Active Problems:   Hypotension   Osteomyelitis of coccyx (HCC)   Acute encephalopathy   Permanent atrial fibrillation (HCC)   Chronic respiratory failure with hypoxia (HCC)   Chronic combined systolic and diastolic CHF (congestive heart failure) (HCC)  Acute encephalopathy - Improving with nightly bipap (and change in abx, though suspect bipap is  what is helping) - appears to be multifactorial and secondary to possible UTI (UA with WBC's, RBC's, bacteria, but urine cx no growth), there was also question of possible seizure secondary to ertapenem and tramadol - hypercarbia may be playing [name] role (despite normal pH) -> pH and CO2 improved today, will plan for nightly bipap going forward.  Of note, pt with normal PCO2 [name] few months ago.   - EEG was done and was notable for mild to moderate global slowing indicating global encephalopathy, typically seen in toxic metabolic process  - continues to be intermittently confused -> this morning seemed [name] bit clearer, [name]&Ox3 - pt was started on Ertapenem and Vancomycin by ID team -> discussed with Dr. Drue Second from ID regarding whether or not these abx could be contributing.  After discussion with ID initially planned to trial pt on zosyn and d/c ertapenem (but after review of cultures, e. Coli was resistant to zosyn.  Will start meropenem instead (had discussed this with ID as well).  Will touch base with ID again today. - foley cath was changed on 05/13/2017  Coccygeal osteomyelitis - Bone biopsy and culture 04/20/2017 - Bone cultures grew group B streptococcus, diphtheroids, E. coli, and MRSA (e. Coli sensitive to cefepime, imipenem, bactrim) - PICC line was placed on 03/29/2017, the patient was started on vancomycin and ertapenem with instructions to finish on 06/07/2017 - plan on consulting with ID regarding continuation of ABX (as above)  Chronic respiratory failure with hypoxia  Hypercarbia: - trial bipap at night - intermittently on oxygen 1-2 L at SNF - CXR with evidence of HF, lasix as below  Acute on Chronic systolic and diastolic CHF - 02/21/2017 discharge weight 203 lbs -  cont home regimen with Bumex -> transition to IV lasix 60 mg BID - ECHO 09/04/2015 with EF 50-55% and grade I diastolic CHF - monitor daily weights and I/O's - this is the weight trend since admission - ECHO  pending  Filed Weights   05/20/17 2130 05/21/17 0500 05/22/17 0633  Weight: 94.7 kg (208 lb 12.4 oz) 94.2 kg (207 lb 10.8 oz) 95 kg (209 lb 7 oz)   Hypotension: chronic, on midorine as outpatient.  Continue this.  Permanent atrial fibrillation - rate has been controlled - continue Rivaroxaban and amiodarone   COPD - Continue Spiriva - no wheezing on exam   Hyperlipidemia - continue statin   Obesity  - Body mass index is 35.3 kg/m  Penile edema - improved - pt seen by urology  - Foley cath replaced  Acute kidney injury - resolved with hydration - weight is up and will therefore stop IVF   DVT prophylaxis: xarelto Code Status: partial, CPR, no intubation Family Communication: daugther and son Disposition Plan: pending improvement   Consultants:   ID phone  Procedures:   none  Antimicrobials:  Anti-infectives (From admission, onward)   Start     Dose/Rate Route Frequency Ordered Stop   05/21/17 0800  vancomycin (VANCOCIN) IVPB 750 mg/150 ml premix     750 mg 150 mL/hr over 60 Minutes Intravenous Every 24 hours 05/20/17 1321     05/20/17 1200  meropenem (MERREM) 1 g in sodium chloride 0.9 % 100 mL IVPB     1 g 200 mL/hr over 30 Minutes Intravenous Every 8 hours 05/19/17 1847     05/19/17 1215  piperacillin-tazobactam (ZOSYN) IVPB 3.375 g  Status:  Discontinued     3.375 g 12.5 mL/hr over 240 Minutes Intravenous Every 8 hours 05/19/17 1202 05/19/17 1836   05/16/17 0800  vancomycin (VANCOCIN) IVPB 1000 mg/200 mL premix  Status:  Discontinued     1,000 mg 200 mL/hr over 60 Minutes Intravenous Every 24 hours 05/16/17 0728 05/20/17 1321   05/15/17 0800  ertapenem (INVANZ) 1 g in sodium chloride 0.9 % 50 mL IVPB  Status:  Discontinued     1 g 100 mL/hr over 30 Minutes Intravenous Every 24 hours 05/14/17 2050 05/19/17 1150   05/14/17 1715  ceFEPIme (MAXIPIME) 2 g in dextrose 5 % 50 mL IVPB     2 g 100 mL/hr over 30 Minutes Intravenous  Once 05/14/17 1701  05/14/17 1941   05/14/17 1715  vancomycin (VANCOCIN) 1,500 mg in sodium chloride 0.9 % 500 mL IVPB  Status:  Discontinued     1,500 mg 250 mL/hr over 120 Minutes Intravenous  Once 05/14/17 1706 05/14/17 1721        Subjective: Still pain in bottom.  OK otherwise.   Objective: Vitals:   05/22/17 0731 05/22/17 0732 05/22/17 0942 05/22/17 1400  BP:    (!) 127/59  Pulse:    88  Resp:    18  Temp:    (!) 97.3 F (36.3 C)  TempSrc:    Oral  SpO2: (!) 84% 93% 93% 95%  Weight:      Height:        Intake/Output Summary (Last 24 hours) at 05/22/2017 2113 Last data filed at 05/22/2017 1700 Gross per 24 hour  Intake 1277 ml  Output 2500 ml  Net -1223 ml   Filed Weights   05/20/17 2130 05/21/17 0500 05/22/17 0633  Weight: 94.7 kg (208 lb 12.4 oz) 94.2 kg (207 lb 10.8 oz)  95 kg (209 lb 7 oz)    Examination:  General: No acute distress. Cardiovascular: Heart sounds show Dorathea Faerber regular rate, and rhythm. No gallops or rubs. No murmurs. No JVD. Lungs: Clear to auscultation bilaterally with good air movement. No rales, rhonchi or wheezes. Abdomen: Soft, nontender, nondistended with normal active bowel sounds. No masses. No hepatosplenomegaly. Neurological: Alert and oriented 3. Cranial nerves II through XII grossly intact. Skin: not examined sacrum today, edematous penis and scrotum Extremities: No clubbing or cyanosis.1+ edema Psychiatric: Mood and affect are normal. Insight and judgment are appropriate.   Data Reviewed: I have personally reviewed following labs and imaging studies  CBC: Recent Labs  Lab 05/19/17 0458 05/20/17 0340 05/21/17 0503 05/22/17 0345  WBC 5.1 6.1 7.2 7.8  HGB 9.5* 9.3* 9.6* 9.3*  HCT 32.5* 30.8* 32.0* 30.0*  MCV 100.0 97.2 95.8 94.0  PLT 228 235 248 271   Basic Metabolic Panel: Recent Labs  Lab 05/18/17 0414 05/19/17 0458 05/20/17 0340 05/21/17 0503 05/22/17 0345  NA 140 143 141 140 140  K 3.7 4.2 3.8 3.2* 3.5  CL 97* 96* 89* 87* 84*  CO2  39* 41* 44* 46* 47*  GLUCOSE 104* 102* 104* 113* 100*  BUN 23* 20 27* 24* 26*  CREATININE 0.82 0.94 0.91 0.88 0.89  CALCIUM 8.4* 8.7* 8.6* 8.4* 8.6*  MG  --   --   --  1.9  --    GFR: Estimated Creatinine Clearance: 64.1 mL/min (by C-G formula based on SCr of 0.89 mg/dL). Liver Function Tests: No results for input(s): AST, ALT, ALKPHOS, BILITOT, PROT, ALBUMIN in the last 168 hours. No results for input(s): LIPASE, AMYLASE in the last 168 hours. No results for input(s): AMMONIA in the last 168 hours. Coagulation Profile: No results for input(s): INR, PROTIME in the last 168 hours. Cardiac Enzymes: No results for input(s): CKTOTAL, CKMB, CKMBINDEX, TROPONINI in the last 168 hours. BNP (last 3 results) No results for input(s): PROBNP in the last 8760 hours. HbA1C: No results for input(s): HGBA1C in the last 72 hours. CBG: No results for input(s): GLUCAP in the last 168 hours. Lipid Profile: No results for input(s): CHOL, HDL, LDLCALC, TRIG, CHOLHDL, LDLDIRECT in the last 72 hours. Thyroid Function Tests: No results for input(s): TSH, T4TOTAL, FREET4, T3FREE, THYROIDAB in the last 72 hours. Anemia Panel: No results for input(s): VITAMINB12, FOLATE, FERRITIN, TIBC, IRON, RETICCTPCT in the last 72 hours. Sepsis Labs: No results for input(s): PROCALCITON, LATICACIDVEN in the last 168 hours.  Recent Results (from the past 240 hour(s))  Culture, blood (routine x 2) Call MD if unable to obtain prior to antibiotics being given     Status: None   Collection Time: 05/14/17  5:01 PM  Result Value Ref Range Status   Specimen Description BLOOD PICC LINE  Final   Special Requests   Final    BOTTLES DRAWN AEROBIC AND ANAEROBIC Blood Culture adequate volume   Culture   Final    NO GROWTH 5 DAYS Performed at [facility], [address] [place], [place] [address]    Report Status 05/20/2017 FINAL  Final  Culture, blood (routine x 2) Call MD if unable to obtain prior to antibiotics  being given     Status: None   Collection Time: 05/14/17  5:06 PM  Result Value Ref Range Status   Specimen Description LEFT ANTECUBITAL  Final   Special Requests   Final    BOTTLES DRAWN AEROBIC AND ANAEROBIC Blood Culture  adequate volume   Culture   Final    NO GROWTH 5 DAYS Performed at [facility], [address] [place], [place] [address]    Report Status 05/20/2017 FINAL  Final  Culture, Urine     Status: None   Collection Time: 05/15/17  6:52 AM  Result Value Ref Range Status   Specimen Description URINE, RANDOM  Final   Special Requests NONE  Final   Culture   Final    NO GROWTH Performed at [facility], [address] [place], [place] [address]    Report Status 05/16/2017 FINAL  Final  MRSA PCR Screening     Status: None   Collection Time: 05/16/17  2:15 AM  Result Value Ref Range Status   MRSA by PCR NEGATIVE NEGATIVE Final    Comment:        The GeneXpert MRSA Assay (FDA approved for NASAL specimens only), is one component of Patrica Mendell comprehensive MRSA colonization surveillance program. It is not intended to diagnose MRSA infection nor to guide or monitor treatment for MRSA infections.          Radiology Studies: No results found.      Scheduled Meds: . acetaminophen  1,000 mg Oral Q8H  . albuterol  2.5 mg Nebulization BID  . amiodarone  200 mg Oral Daily  . Chlorhexidine Gluconate Cloth  6 each Topical Daily  . docusate sodium  100 mg Oral BID  . feeding supplement (ENSURE ENLIVE)  237 mL Oral TID BM  . ferrous sulfate  325 mg Oral TID WC  . furosemide  60 mg Intravenous Q12H  . loratadine  10 mg Oral Daily  . magnesium oxide  200 mg Oral Daily  . multivitamin with minerals  1 tablet Oral Daily  . polyethylene glycol  17 g Oral BID  . rivaroxaban  20 mg Oral Q2000  . tamsulosin  0.4 mg Oral Daily  . tiotropium  18 mcg Inhalation Daily   Continuous Infusions: . sodium chloride 10 mL/hr at 05/16/17 1437  . meropenem (MERREM) IV  Stopped (05/22/17 1317)  . vancomycin Stopped (05/22/17 0981)     LOS: 7 days    Time spent: over 30 min    Lacretia Nicks, MD [facility] Pager [phone]  If 7PM-7AM, please contact night-coverage [email or web address] Password [facility] 05/22/2017, 9:13 PM

## 2017-05-22 NOTE — Progress Notes (Signed)
PT Cancellation Note  Patient Details Name: [name] MRN: [id number] DOB: 05/29/1926   Cancelled Treatment:    Reason Eval/Treat Not Completed: PT screened, no needs identified, will sign off, patient has been screen previously, long term resident and total care.    [name] 05/22/2017, 5:22 PM [name] PT [phone]

## 2017-05-23 LAB — BLOOD GAS, VENOUS
ACID-BASE EXCESS: 21.2 mmol/L — AB (ref 0.0–2.0)
Acid-Base Excess: 22.5 mmol/L — ABNORMAL HIGH (ref 0.0–2.0)
Acid-Base Excess: 24.2 mmol/L — ABNORMAL HIGH (ref 0.0–2.0)
Bicarbonate: 49.4 mmol/L — ABNORMAL HIGH (ref 20.0–28.0)
Bicarbonate: 50.5 mmol/L — ABNORMAL HIGH (ref 20.0–28.0)
Bicarbonate: 52.5 mmol/L — ABNORMAL HIGH (ref 20.0–28.0)
DELIVERY SYSTEMS: POSITIVE
DRAWN BY: 514251
Delivery systems: POSITIVE
EXPIRATORY PAP: 5
FIO2: 35
FIO2: 28
Inspiratory PAP: 10
Mode: POSITIVE
O2 SAT: 62.1 %
O2 Saturation: 64.4 %
O2 Saturation: 70.6 %
PATIENT TEMPERATURE: 98.6
PEEP: 10 cmH2O
PH VEN: 7.399 (ref 7.250–7.430)
PH VEN: 7.42 (ref 7.250–7.430)
PO2 VEN: 41.1 mmHg (ref 32.0–45.0)
Patient temperature: 37
Patient temperature: 98.6
pCO2, Ven: 81.6 mmHg (ref 44.0–60.0)
pCO2, Ven: 78.2 mmHg (ref 44.0–60.0)
pCO2, Ven: 82.4 mmHg (ref 44.0–60.0)
pH, Ven: 7.425 (ref 7.250–7.430)
pO2, Ven: 36 mmHg (ref 32.0–45.0)
pO2, Ven: 35.3 mmHg (ref 32.0–45.0)

## 2017-05-23 NOTE — Progress Notes (Signed)
Pharmacy Antibiotic Note  [name] is a 84 y.o. male admitted on 05/14/2017 with sepsis/osteomyelitis.  Pt came in on vanc and ertapemen that was started on 11/13 with instruction to finish on 06/07/17.  Pharmacy consulted to dose vancomycin.  Initially vanc trough was elevated and doses were held.  Vanc was resumed on 12/31.  Zosyn given x 1 dose 1/3 then meropenem started.   05/23/2017 Scr stabke WBC WNL afebrile  Plan: Continue Meropenem 1g IV q8h Contniue vancomycin to 750 mg IV q24 for AUC 494.5, est Cmax 29.7, Cmin 13.5 - plan to check weekly troughs (next due 1/11) - plan was to continue abx until 1/22 but TR[facility] may discuss this with ID  Height: 5\' 8"  (172.7 cm) Weight: 207 lb 10.8 oz (94.2 kg) IBW/kg (Calculated) : 68.4  Temp (24hrs), Avg:97.8 F (36.6 C), Min:97.3 F (36.3 C), Max:98.3 F (36.8 C)  Recent Labs  Lab 05/18/17 0414 05/19/17 0458 05/20/17 0340 05/20/17 0845 05/20/17 1059 05/21/17 0503 05/22/17 0345  WBC  --  5.1 6.1  --   --  7.2 7.8  CREATININE 0.82 0.94 0.91  --   --  0.88 0.89  VANCOTROUGH  --   --   --  18  --   --   --   VANCOPEAK  --   --   --   --  38  --   --     Estimated Creatinine Clearance: 63.9 mL/min (by C-G formula based on SCr of 0.89 mg/dL).    Allergies  Allergen Reactions  . Nsaids     Stomach pain  . Vicodin [Hydrocodone-Acetaminophen] Other (See Comments)    Unknown reaction many years ago  . Morphine And Related Nausea And Vomiting    Dizziness, light headed. "Opiates cause tightness in chest".   Antimicrobials this admission:  (12/12 PTA )  vancomycin >>  (12/12 PTA)  ertapenem >> 1/3 1/3 zosyn x 1 dose 1/3 meropenem >>  Dose adjustments this admission:  12/29: VT at 1720: 28 mcg/ml, continue to hold vanc (was on 1gm q12h) 12/30 0600 VR: 23 mcg/ml, continue to hold vanc  12/31: 0600 VR: 10mcg/ml 1/4 VT 18 at 0845 1/4 VP 38 at 1059 Ke 0.0343, T1/2 20.2, Cmax 39, Cmin 18, Vd 44.  Microbiology results:  12/30  BCx: ngtd 12/30 UCx:  ngf 12/30 MRSA PCR: neg  12/5 Coccyx bone: MRSA + E.coli (S cefepime, imipenem, bactrim; R zosyn)  Thank you for allowing pharmacy to be a part of this patient's care.  Juliette Alcide, PharmD, BCPS.   Pager: [phone] 05/23/2017 1:46 PM

## 2017-05-23 NOTE — Care Management Note (Signed)
Case Management Note  Patient Details  Name: [name] MRN: [id number] Date of Birth: [birth year]  Subjective/Objective:                  ams from [facility] increasing peroids of somnolence.  Action/Plan: Date:  May 23, 2017 Chart reviewed for concurrent status and case management needs.  Will continue to follow patient progress.  Discharge Planning: following for needs  Expected discharge date: May 26 2017 Marcelle Smiling, BSN, [place], [place]   [phone]   Expected Discharge Date:  05/18/17               Expected Discharge Plan:  Skilled Nursing Facility  In-House Referral:  Clinical Social Work  Discharge planning Services  CM Consult  Post Acute Care Choice:    Choice offered to:     DME Arranged:    DME Agency:     HH Arranged:    HH Agency:     Status of Service:  In process, will continue to follow  If discussed at Long Length of Stay Meetings, dates discussed:    Additional Comments:  Golda Acre, RN 05/23/2017, 10:05 AM

## 2017-05-23 NOTE — Progress Notes (Signed)
PROGRESS NOTE    [name]  ZOX:[id number] DOB: 06/14/1926 DOA: 05/14/2017 PCP: [name], MD   Brief Narrative: ( 84 y.o.malewith medical history ofpersistent atrial fibrillation, systolic and diastolic CHF, COPD, coccygeal osteomyelitis, hyperlipidemia presenting with fluctuating mental status since May 11, 2017. The patient was recently discharged from the hospital after [name] stay from 02/07/17 through 02/21/17 when he was treated for sepsis secondary to sacral osteomyelitis. [name] PICC line was placed, and the patient was discharged with antibiotics to finish on 03/22/2017. His PICC line was subsequently removed after he finished his antibiotics. He continued to receive wound care. During the wound care visit on 04/20/2017, the patient was noted to have necrotic bone in his coccyx. Bone biopsy and cultures were sent and were consistent with osteomyelitis. Bone cultures grew group B streptococcus, diphtheroids, E. coli, and MRSA [name] PICC line was placed on 03/29/2017, the patient was started on vancomycin and ertapenem With instructions to finish on 06/07/2017.The patient was doing fairly well up until the last 3 days when his family noted that he has been having intermittent episodes of somnolence and difficulty to arouse. In between these episodes, the patient will be fully lucid and able to speak with clarity. On the morning of 05/14/2017, the patient was difficult to arouse, and was somnolent after he was awoken. As result, the patient was transferred to the emergency department for further evaluation  Assessment & Plan:   Active Problems:   Hypotension   Osteomyelitis of coccyx (HCC)   Acute encephalopathy   Permanent atrial fibrillation (HCC)   Chronic respiratory failure with hypoxia (HCC)   Chronic combined systolic and diastolic CHF (congestive heart failure) (HCC)  Acute encephalopathy - Improving with nightly bipap (and change in abx, though suspect bipap is  what is helping) - appears to be multifactorial and secondary to possible UTI (UA with WBC's, RBC's, bacteria, but urine cx no growth), there was also question of possible seizure secondary to ertapenem and tramadol - hypercarbia may be playing [name] role (despite normal pH) -> pH and CO2 improved today, will plan for nightly bipap going forward.  Of note, pt with normal PCO2 [name] few months ago.   - EEG was done and was notable for mild to moderate global slowing indicating global encephalopathy, typically seen in toxic metabolic process  - pt was started on Ertapenem and Vancomycin by ID team -> discussed with Dr. Drue [name] from ID regarding whether or not these abx could be contributing.  After discussion with ID initially planned to trial pt on zosyn and d/c ertapenem (but after review of cultures, e. Coli was resistant to zosyn.  Will start meropenem instead (had discussed this with ID as well).  Will plan to resume erta at d/c given suspicion hypercarbia primary cause.  - foley cath was changed on 05/13/2017  Coccygeal osteomyelitis - Bone biopsy and culture 04/20/2017 - Bone cultures grew group B streptococcus, diphtheroids, E. coli, and MRSA (e. Coli sensitive to cefepime, imipenem, bactrim) - PICC line was placed on 03/29/2017, the patient was started on vancomycin and ertapenem with instructions to finish on 06/07/2017 - plan on consulting with ID regarding continuation of ABX (as above)  Chronic respiratory failure with hypoxia  Hypercarbia: - trial bipap at night - intermittently on oxygen 1-2 L at SNF - CXR with evidence of HF, lasix as below  Acute on Chronic systolic and diastolic CHF - 02/21/2017 discharge weight 203 lbs - cont home regimen with Bumex -> hold lasix  for now with hypotension, hopefully start bumex again tomorrow - ECHO 09/04/2015 with EF 50-55% and grade I diastolic CHF - monitor daily weights and I/O's - this is the weight trend since admission - ECHO with EF 60-65%,  incoordinate septal motion, moderate AS (see report)  Filed Weights   05/21/17 0500 05/22/17 0633 05/23/17 0551  Weight: 94.2 kg (207 lb 10.8 oz) 95 kg (209 lb 7 oz) 94.2 kg (207 lb 10.8 oz)   Hypotension: Worse today, hold diuresis for now, continue to monitor.  chronic, on midorine as outpatient.    Permanent atrial fibrillation - rate has been controlled - continue Rivaroxaban and amiodarone   COPD - Continue Spiriva - no wheezing on exam   Hyperlipidemia - continue statin   Obesity  - Body mass index is 35.3 kg/m  Penile edema - improved - pt seen by urology  - Foley cath replaced  Acute kidney injury - resolved with hydration - weight is up and will therefore stop IVF   DVT prophylaxis: xarelto Code Status: partial, CPR, no intubation Family Communication: daugther and son Disposition Plan: pending improvement   Consultants:   ID phone  Procedures:   none  Antimicrobials:  Anti-infectives (From admission, onward)   Start     Dose/Rate Route Frequency Ordered Stop   05/21/17 0800  vancomycin (VANCOCIN) IVPB 750 mg/150 ml premix     750 mg 150 mL/hr over 60 Minutes Intravenous Every 24 hours 05/20/17 1321     05/20/17 1200  meropenem (MERREM) 1 g in sodium chloride 0.9 % 100 mL IVPB     1 g 200 mL/hr over 30 Minutes Intravenous Every 8 hours 05/19/17 1847     05/19/17 1215  piperacillin-tazobactam (ZOSYN) IVPB 3.375 g  Status:  Discontinued     3.375 g 12.5 mL/hr over 240 Minutes Intravenous Every 8 hours 05/19/17 1202 05/19/17 1836   05/16/17 0800  vancomycin (VANCOCIN) IVPB 1000 mg/200 mL premix  Status:  Discontinued     1,000 mg 200 mL/hr over 60 Minutes Intravenous Every 24 hours 05/16/17 0728 05/20/17 1321   05/15/17 0800  ertapenem (INVANZ) 1 g in sodium chloride 0.9 % 50 mL IVPB  Status:  Discontinued     1 g 100 mL/hr over 30 Minutes Intravenous Every 24 hours 05/14/17 2050 05/19/17 1150   05/14/17 1715  ceFEPIme (MAXIPIME) 2 g in  dextrose 5 % 50 mL IVPB     2 g 100 mL/hr over 30 Minutes Intravenous  Once 05/14/17 1701 05/14/17 1941   05/14/17 1715  vancomycin (VANCOCIN) 1,500 mg in sodium chloride 0.9 % 500 mL IVPB  Status:  Discontinued     1,500 mg 250 mL/hr over 120 Minutes Intravenous  Once 05/14/17 1706 05/14/17 1721        Subjective: In good spirits. Asking if I've seen the pelican brief. Joking.   No LH.  Objective: Vitals:   05/23/17 0623 05/23/17 1305 05/23/17 1416 05/23/17 1424  BP: (!) 86/46  (!) 87/37 (!) 98/50  Pulse: 72  79   Resp: 20  20   Temp:   98.6 F (37 C)   TempSrc:   Oral   SpO2:  94% 93%   Weight:      Height:        Intake/Output Summary (Last 24 hours) at 05/23/2017 1804 Last data filed at 05/23/2017 1416 Gross per 24 hour  Intake 1590 ml  Output -  Net 1590 ml   [employer]  05/21/17 0500 05/22/17 0633 05/23/17 0551  Weight: 94.2 kg (207 lb 10.8 oz) 95 kg (209 lb 7 oz) 94.2 kg (207 lb 10.8 oz)    Examination:  General: No acute distress. Cardiovascular: Heart sounds show Dewaine Morocho regular rate, and rhythm. No gallops or rubs. No murmurs. No JVD. Lungs: Clear to auscultation bilaterally with good air movement. No rales, rhonchi or wheezes. Abdomen: Soft, nontender, nondistended with normal active bowel sounds. No masses. No hepatosplenomegaly. Neurological: Alert and oriented 3. Moves all extremities 4. Cranial nerves II through XII grossly intact. Skin: sacrum not examined Extremities: No clubbing or cyanosis. 1+ LEE bilaterally Psychiatric: Mood and affect are normal. Insight and judgment are appropriate.    Data Reviewed: I have personally reviewed following labs and imaging studies  CBC: Recent Labs  Lab 05/19/17 0458 05/20/17 0340 05/21/17 0503 05/22/17 0345  WBC 5.1 6.1 7.2 7.8  HGB 9.5* 9.3* 9.6* 9.3*  HCT 32.5* 30.8* 32.0* 30.0*  MCV 100.0 97.2 95.8 94.0  PLT 228 235 248 271   Basic Metabolic Panel: Recent Labs  Lab 05/18/17 0414  05/19/17 0458 05/20/17 0340 05/21/17 0503 05/22/17 0345  NA 140 143 141 140 140  K 3.7 4.2 3.8 3.2* 3.5  CL 97* 96* 89* 87* 84*  CO2 39* 41* 44* 46* 47*  GLUCOSE 104* 102* 104* 113* 100*  BUN 23* 20 27* 24* 26*  CREATININE 0.82 0.94 0.91 0.88 0.89  CALCIUM 8.4* 8.7* 8.6* 8.4* 8.6*  MG  --   --   --  1.9  --    GFR: Estimated Creatinine Clearance: 63.9 mL/min (by C-G formula based on SCr of 0.89 mg/dL). Liver Function Tests: No results for input(s): AST, ALT, ALKPHOS, BILITOT, PROT, ALBUMIN in the last 168 hours. No results for input(s): LIPASE, AMYLASE in the last 168 hours. No results for input(s): AMMONIA in the last 168 hours. Coagulation Profile: No results for input(s): INR, PROTIME in the last 168 hours. Cardiac Enzymes: No results for input(s): CKTOTAL, CKMB, CKMBINDEX, TROPONINI in the last 168 hours. BNP (last 3 results) No results for input(s): PROBNP in the last 8760 hours. HbA1C: No results for input(s): HGBA1C in the last 72 hours. CBG: No results for input(s): GLUCAP in the last 168 hours. Lipid Profile: No results for input(s): CHOL, HDL, LDLCALC, TRIG, CHOLHDL, LDLDIRECT in the last 72 hours. Thyroid Function Tests: No results for input(s): TSH, T4TOTAL, FREET4, T3FREE, THYROIDAB in the last 72 hours. Anemia Panel: No results for input(s): VITAMINB12, FOLATE, FERRITIN, TIBC, IRON, RETICCTPCT in the last 72 hours. Sepsis Labs: No results for input(s): PROCALCITON, LATICACIDVEN in the last 168 hours.  Recent Results (from the past 240 hour(s))  Culture, blood (routine x 2) Call MD if unable to obtain prior to antibiotics being given     Status: None   Collection Time: 05/14/17  5:01 PM  Result Value Ref Range Status   Specimen Description BLOOD PICC LINE  Final   Special Requests   Final    BOTTLES DRAWN AEROBIC AND ANAEROBIC Blood Culture adequate volume   Culture   Final    NO GROWTH 5 DAYS Performed at [facility], [address],  [place], [place] [address]    Report Status 05/20/2017 FINAL  Final  Culture, blood (routine x 2) Call MD if unable to obtain prior to antibiotics being given     Status: None   Collection Time: 05/14/17  5:06 PM  Result Value Ref Range Status   Specimen  Description LEFT ANTECUBITAL  Final   Special Requests   Final    BOTTLES DRAWN AEROBIC AND ANAEROBIC Blood Culture adequate volume   Culture   Final    NO GROWTH 5 DAYS Performed at [facility], [address], [place], [place] [address]    Report Status 05/20/2017 FINAL  Final  Culture, Urine     Status: None   Collection Time: 05/15/17  6:52 AM  Result Value Ref Range Status   Specimen Description URINE, RANDOM  Final   Special Requests NONE  Final   Culture   Final    NO GROWTH Performed at [facility], [address] [place], [place] [address]    Report Status 05/16/2017 FINAL  Final  MRSA PCR Screening     Status: None   Collection Time: 05/16/17  2:15 AM  Result Value Ref Range Status   MRSA by PCR NEGATIVE NEGATIVE Final    Comment:        The GeneXpert MRSA Assay (FDA approved for NASAL specimens only), is one component of Elma Limas comprehensive MRSA colonization surveillance program. It is not intended to diagnose MRSA infection nor to guide or monitor treatment for MRSA infections.          Radiology Studies: No results found.      Scheduled Meds: . acetaminophen  1,000 mg Oral Q8H  . albuterol  2.5 mg Nebulization BID  . amiodarone  200 mg Oral Daily  . Chlorhexidine Gluconate Cloth  6 each Topical Daily  . docusate sodium  100 mg Oral BID  . feeding supplement (ENSURE ENLIVE)  237 mL Oral TID BM  . ferrous sulfate  325 mg Oral TID WC  . loratadine  10 mg Oral Daily  . magnesium oxide  200 mg Oral Daily  . multivitamin with minerals  1 tablet Oral Daily  . polyethylene glycol  17 g Oral BID  . rivaroxaban  20 mg Oral Q2000  . tamsulosin  0.4 mg Oral Daily  . tiotropium  18 mcg Inhalation  Daily   Continuous Infusions: . sodium chloride 10 mL/hr at 05/16/17 1437  . meropenem (MERREM) IV Stopped (05/23/17 1330)  . vancomycin Stopped (05/23/17 1009)     LOS: 8 days    Time spent: over 30 min    Lacretia Nicks, MD [facility] Pager [phone]  If 7PM-7AM, please contact night-coverage [email or web address] Password [facility] 05/23/2017, 6:04 PM

## 2017-05-23 NOTE — Progress Notes (Signed)
CRITICAL VALUE ALERT  Critical Value:  PCO2 82.4, Bicarb 52.5  Date & Time Notied:  05/23/17 @ 1253  Provider Notified: Dr. Salena Saner. Lowell Guitar  Orders Received/Actions taken: acknowledged

## 2017-05-23 NOTE — Progress Notes (Signed)
Nutrition Follow-up  DOCUMENTATION CODES:   Obesity unspecified  INTERVENTION:   Ensure Enlive po BID, each supplement provides 350 kcal and 20 grams of protein Provide MVI daily  Recommend Vitamin C and Zinc supplementation.  Vitamin C: 500 mg BID Zinc: 150 mg for 2 weeks (Longer supplementation may lead to copper deficiency  NUTRITION DIAGNOSIS:   Increased nutrient needs related to wound healing as evidenced by estimated needs.  Ongoing  GOAL:   Patient will meet greater than or equal to 90% of their needs  Not meeting  MONITOR:   PO intake, Supplement acceptance, Weight trends, Labs, I & O's, Skin  REASON FOR ASSESSMENT:   Consult Wound healing  ASSESSMENT:   84 y.o. male with medical history of persistent atrial fibrillation, systolic and diastolic CHF, COPD, coccygeal osteomyelitis, hyperlipidemia presenting with fluctuating mental status since May 11, 2017.  The patient was recently discharged from the hospital after a stay from 02/07/17 through 02/21/17 when he was treated for sepsis secondary to sacral osteomyelitis.  A PICC line was placed, and the patient was discharged with antibiotics to finish on 03/22/2017.    Spoke with pt and daughter at bedside. States appetite remains poor, but pt is drinking three Ensure's per day. Pt will eat when daughter is present but refuses when she's not. Pt does not like food at SNF that he will be going back to. Discussed increasing Ensure if poor PO intake persist. Meal completions charted as ~50% average for last eight meals. Weight noted to be down 5 lb since last RD visit. Pt -6000 ml fluid balance. To d/c possibly today or tomorrow. Will continue with current nutrition intervention.   Medications reviewed and include: ferrous sulfate, Mag oxide, IV abx, MVI Labs reviewed: Cl 84 (L) CO2 47 (H)   Diet Order:  Diet Heart Room service appropriate? Yes; Fluid consistency: Thin  EDUCATION NEEDS:   No education needs have  been identified at this time  Skin:  Skin Assessment: Skin Integrity Issues: Skin Integrity Issues:: Stage IV Stage IV: sacrum  Last BM:  05/22/16  Height:   Ht Readings from Last 1 Encounters:  05/14/17 5\' 8"  (1.727 m)    Weight:   Wt Readings from Last 1 Encounters:  05/23/17 207 lb 10.8 oz (94.2 kg)    Ideal Body Weight:  70 kg  BMI:  Body mass index is 31.58 kg/m.  Estimated Nutritional Needs:   Kcal:  2200-2400  Protein:  105-115g  Fluid:  2.2L/day    Vanessa Kick RD, LDN Clinical Nutrition Pager # [phone]

## 2017-05-24 ENCOUNTER — Inpatient Hospital Stay (HOSPITAL_COMMUNITY): Payer: Medicare Other

## 2017-05-24 LAB — CBC
HEMATOCRIT: 28 % — AB (ref 39.0–52.0)
HEMOGLOBIN: 8.7 g/dL — AB (ref 13.0–17.0)
MCH: 29.2 pg (ref 26.0–34.0)
MCHC: 31.1 g/dL (ref 30.0–36.0)
MCV: 94 fL (ref 78.0–100.0)
Platelets: 317 10*3/uL (ref 150–400)
RBC: 2.98 MIL/uL — AB (ref 4.22–5.81)
RDW: 15.4 % (ref 11.5–15.5)
WBC: 7.9 10*3/uL (ref 4.0–10.5)

## 2017-05-24 LAB — BASIC METABOLIC PANEL
Anion gap: 9 (ref 5–15)
BUN: 33 mg/dL — ABNORMAL HIGH (ref 6–20)
CALCIUM: 8.7 mg/dL — AB (ref 8.9–10.3)
CO2: 46 mmol/L — AB (ref 22–32)
CREATININE: 0.86 mg/dL (ref 0.61–1.24)
Chloride: 83 mmol/L — ABNORMAL LOW (ref 101–111)
GFR calc Af Amer: >60 mL/min (ref >60)
GFR calc non Af Amer: >60 mL/min (ref >60)
GLUCOSE: 94 mg/dL (ref 65–99)
Potassium: 3.4 mmol/L — ABNORMAL LOW (ref 3.5–5.1)
Sodium: 138 mmol/L (ref 135–145)

## 2017-05-24 LAB — BLOOD GAS, VENOUS
ACID-BASE EXCESS: 25 mmol/L — AB (ref 0.0–2.0)
BICARBONATE: 52.9 mmol/L — AB (ref 20.0–28.0)
O2 SAT: 66.7 %
PCO2 VEN: 79 mmHg — AB (ref 44.0–60.0)
PH VEN: 7.441 — AB (ref 7.250–7.430)
PO2 VEN: 37.4 mmHg (ref 32.0–45.0)
Patient temperature: 98.6

## 2017-05-24 MED ORDER — MIDODRINE HCL 5 MG PO TABS
10.0000 mg | ORAL_TABLET | Freq: Three times a day (TID) | ORAL | Status: DC
  Administered 2017-05-24 – 2017-05-28 (×13): 10 mg via ORAL
  Filled 2017-05-24 (×14): qty 2

## 2017-05-24 NOTE — Progress Notes (Signed)
LCSW following for return to facility.  Patient is from [facility]. Went in for short term SNF in Oct. Patients infection returned. He became weaker and returned to the hospital. Plan is to return to SNF short term at dc.   Patient will need new PT/OT consult.   Patient will need pre auth. LCSW will start auth for IV antibiotics and wound care  Patient not medically ready today. Maybe 1-2 days per MD.  LCSW spoke with daughter, Corrie Dandy by phone and provided update.  LCSW provided update to facility.     LCSW will continue to follow.   Beulah Gandy Salamonia Long CSW [phone]

## 2017-05-24 NOTE — Progress Notes (Signed)
PROGRESS NOTE    [name]  WUJ:[id number] DOB: 1927/03/31 DOA: 05/14/2017 PCP: [name], MD   Brief Narrative:  84 y.o.malewith medical history ofpersistent atrial fibrillation, systolic and diastolic CHF, COPD, coccygeal osteomyelitis, hyperlipidemia presenting with fluctuating mental status since May 11, 2017. The patient was recently discharged from the hospital after [name] stay from 02/07/17 through 02/21/17 when he was treated for sepsis secondary to sacral osteomyelitis. [name] PICC line was placed, and the patient was discharged with antibiotics to finish on 03/22/2017. His PICC line was subsequently removed after he finished his antibiotics. He continued to receive wound care. During the wound care visit on 04/20/2017, the patient was noted to have necrotic bone in his coccyx. Bone biopsy and cultures were sent and were consistent with osteomyelitis. Bone cultures grew group B streptococcus, diphtheroids, E. coli, and MRSA [name] PICC line was placed on 03/29/2017, the patient was started on vancomycin and ertapenem With instructions to finish on 06/07/2017.The patient was doing fairly well up until the last 3 days when his family noted that he has been having intermittent episodes of somnolence and difficulty to arouse. In between these episodes, the patient will be fully lucid and able to speak with clarity. On the morning of 05/14/2017, the patient was difficult to arouse, and was somnolent after he was awoken. As result, the patient was transferred to the emergency department for further evaluation  Assessment & Plan:   Active Problems:   Hypotension   Osteomyelitis of coccyx (HCC)   Acute encephalopathy   Permanent atrial fibrillation (HCC)   Chronic respiratory failure with hypoxia (HCC)   Chronic combined systolic and diastolic CHF (congestive heart failure) (HCC)  Acute encephalopathy Suspect this was due to hypercarbia (despite normal pH) given his  improvement with starting nightly bipap.  There was question of whether vancomycin or ertapenem may be contributing and we changed his ertapenem to meropenem here, but I have lower suspicion this is contributing and we can probably resume ertapenem at discharge (discussed with ID). - initially concern for UTI (but no growth) and concern for seizure (due to tramadol or ertapenem) - EEG was done and was notable for mild to moderate global slowing indicating global encephalopathy, typically seen in toxic metabolic process  - foley cath was changed on 05/13/2017  Coccygeal osteomyelitis - Bone biopsy and culture 04/20/2017 - Bone cultures grew group B streptococcus, diphtheroids, E. coli, and MRSA (e. Coli sensitive to cefepime, imipenem, bactrim) - PICC line was placed on 03/29/2017, the patient was started on vancomycin and ertapenem with instructions to finish on 06/07/2017 - Discussed with ID over phone given concern with confusion as noted above  Chronic respiratory failure with hypoxia  Hypercarbia: - trial bipap at night - intermittently on oxygen 1-2 L at SNF - CXR with evidence of HF, lasix as below  Acute on Chronic systolic and diastolic CHF - 02/21/2017 discharge weight 203 lbs - was on bumex at home, but started lasix 60 mg BID for HF exacerbation   - currently holding lasix with hypotension, hopefully can restart home bumex when BP's more consistently better [ ]  repeat CXR 1/8 with CHF, slight improvement - ECHO 09/04/2015 with EF 50-55% and grade I diastolic CHF - monitor daily weights and I/O's - ECHO from this admission with EF 60-65%, incoordinate septal motion, moderate AS (see report)  Filed Weights   05/22/17 0633 05/23/17 0551 05/24/17 0418  Weight: 95 kg (209 lb 7 oz) 94.2 kg (207 lb 10.8 oz)  92.6 kg (204 lb 2.3 oz)   Hypotension: This is chronic and he's been on midodrine as an outpatient.  This was ordered prn and he's only been receiving it intermittently.   Will  schedule midodrine and hold diuresis for now    Permanent atrial fibrillation - rate has been controlled - continue Rivaroxaban and amiodarone   COPD - Continue Spiriva - no wheezing on exam   Hyperlipidemia - continue statin   Obesity  - Body mass index is 35.3 kg/m  Penile and scrotal edema - pt seen by urology  - Foley cath replaced  Acute kidney injury - resolved with hydration - weight is up and will therefore stop IVF   DVT prophylaxis: xarelto Code Status: partial, CPR, no intubation Family Communication: daugther and son Disposition Plan: pending improvement   Consultants:   ID phone  Procedures:   none  Antimicrobials:  Anti-infectives (From admission, onward)   Start     Dose/Rate Route Frequency Ordered Stop   05/21/17 0800  vancomycin (VANCOCIN) IVPB 750 mg/150 ml premix     750 mg 150 mL/hr over 60 Minutes Intravenous Every 24 hours 05/20/17 1321     05/20/17 1200  meropenem (MERREM) 1 g in sodium chloride 0.9 % 100 mL IVPB     1 g 200 mL/hr over 30 Minutes Intravenous Every 8 hours 05/19/17 1847     05/19/17 1215  piperacillin-tazobactam (ZOSYN) IVPB 3.375 g  Status:  Discontinued     3.375 g 12.5 mL/hr over 240 Minutes Intravenous Every 8 hours 05/19/17 1202 05/19/17 1836   05/16/17 0800  vancomycin (VANCOCIN) IVPB 1000 mg/200 mL premix  Status:  Discontinued     1,000 mg 200 mL/hr over 60 Minutes Intravenous Every 24 hours 05/16/17 0728 05/20/17 1321   05/15/17 0800  ertapenem (INVANZ) 1 g in sodium chloride 0.9 % 50 mL IVPB  Status:  Discontinued     1 g 100 mL/hr over 30 Minutes Intravenous Every 24 hours 05/14/17 2050 05/19/17 1150   05/14/17 1715  ceFEPIme (MAXIPIME) 2 g in dextrose 5 % 50 mL IVPB     2 g 100 mL/hr over 30 Minutes Intravenous  Once 05/14/17 1701 05/14/17 1941   05/14/17 1715  vancomycin (VANCOCIN) 1,500 mg in sodium chloride 0.9 % 500 mL IVPB  Status:  Discontinued     1,500 mg 250 mL/hr over 120 Minutes  Intravenous  Once 05/14/17 1706 05/14/17 1721        Subjective: Daughter said he was [name] little confused this morning, but that's improved.  No complaints this morning.   Objective: Vitals:   05/23/17 1424 05/23/17 2223 05/24/17 0418 05/24/17 1403  BP: (!) 98/50 (!) 85/35 (!) 92/42 98/60  Pulse:  78 67 65  Resp:  20 18 18   Temp:  98.7 F (37.1 C) 98.6 F (37 C) (!) 97.4 F (36.3 C)  TempSrc:  Oral Oral Oral  SpO2:  91% 92% 94%  Weight:   92.6 kg (204 lb 2.3 oz)   Height:        Intake/Output Summary (Last 24 hours) at 05/24/2017 1739 Last data filed at 05/24/2017 1600 Gross per 24 hour  Intake 1337.17 ml  Output 1825 ml  Net -487.83 ml   Filed Weights   05/22/17 0633 05/23/17 0551 05/24/17 0418  Weight: 95 kg (209 lb 7 oz) 94.2 kg (207 lb 10.8 oz) 92.6 kg (204 lb 2.3 oz)    Examination:  General: No acute distress. Cardiovascular: Heart  sounds show [place] Held regular rate, and rhythm. No gallops or rubs. No murmurs. No JVD. Lungs: Clear to auscultation bilaterally with good air movement. No rales, rhonchi or wheezes. Abdomen: Soft, nontender, nondistended with normal active bowel sounds. No masses. No hepatosplenomegaly. Neurological: Alert and oriented 3. Moves all extremities 4. Cranial nerves II through XII grossly intact. Skin: sacrum not examined this morning Extremities: No clubbing or cyanosis. 1+ bilateral edema Psychiatric: Mood and affect are normal. Insight and judgment are appropriate.     Data Reviewed: I have personally reviewed following labs and imaging studies  CBC: Recent Labs  Lab 05/19/17 0458 05/20/17 0340 05/21/17 0503 05/22/17 0345 05/24/17 0525  WBC 5.1 6.1 7.2 7.8 7.9  HGB 9.5* 9.3* 9.6* 9.3* 8.7*  HCT 32.5* 30.8* 32.0* 30.0* 28.0*  MCV 100.0 97.2 95.8 94.0 94.0  PLT 228 235 248 271 317   Basic Metabolic Panel: Recent Labs  Lab 05/19/17 0458 05/20/17 0340 05/21/17 0503 05/22/17 0345 05/24/17 0525  NA 143 141 140 140 138  K 4.2  3.8 3.2* 3.5 3.4*  CL 96* 89* 87* 84* 83*  CO2 41* 44* 46* 47* 46*  GLUCOSE 102* 104* 113* 100* 94  BUN 20 27* 24* 26* 33*  CREATININE 0.94 0.91 0.88 0.89 0.86  CALCIUM 8.7* 8.6* 8.4* 8.6* 8.7*  MG  --   --  1.9  --   --    GFR: Estimated Creatinine Clearance: 65.6 mL/min (by C-G formula based on SCr of 0.86 mg/dL). Liver Function Tests: No results for input(s): AST, ALT, ALKPHOS, BILITOT, PROT, ALBUMIN in the last 168 hours. No results for input(s): LIPASE, AMYLASE in the last 168 hours. No results for input(s): AMMONIA in the last 168 hours. Coagulation Profile: No results for input(s): INR, PROTIME in the last 168 hours. Cardiac Enzymes: No results for input(s): CKTOTAL, CKMB, CKMBINDEX, TROPONINI in the last 168 hours. BNP (last 3 results) No results for input(s): PROBNP in the last 8760 hours. HbA1C: No results for input(s): HGBA1C in the last 72 hours. CBG: No results for input(s): GLUCAP in the last 168 hours. Lipid Profile: No results for input(s): CHOL, HDL, LDLCALC, TRIG, CHOLHDL, LDLDIRECT in the last 72 hours. Thyroid Function Tests: No results for input(s): TSH, T4TOTAL, FREET4, T3FREE, THYROIDAB in the last 72 hours. Anemia Panel: No results for input(s): VITAMINB12, FOLATE, FERRITIN, TIBC, IRON, RETICCTPCT in the last 72 hours. Sepsis Labs: No results for input(s): PROCALCITON, LATICACIDVEN in the last 168 hours.  Recent Results (from the past 240 hour(s))  Culture, Urine     Status: None   Collection Time: 05/15/17  6:52 AM  Result Value Ref Range Status   Specimen Description URINE, RANDOM  Final   Special Requests NONE  Final   Culture   Final    NO GROWTH Performed at [facility], [address], [place], [place] [address]    Report Status 05/16/2017 FINAL  Final  MRSA PCR Screening     Status: None   Collection Time: 05/16/17  2:15 AM  Result Value Ref Range Status   MRSA by PCR NEGATIVE NEGATIVE Final    Comment:        The GeneXpert MRSA  Assay (FDA approved for NASAL specimens only), is one component of [facility] Steines comprehensive MRSA colonization surveillance program. It is not intended to diagnose MRSA infection nor to guide or monitor treatment for MRSA infections.          Radiology Studies: Dg Chest [facility]  Result Date: 05/24/2017 CLINICAL DATA:  Shortness of breath.  COPD. EXAM: PORTABLE CHEST 1 VIEW COMPARISON:  05/19/2017. FINDINGS: PICC line noted with tip over the superior vena cava. Cardiomegaly with mild bilateral vascular prominence interstitial prominence. Bilateral pleural effusions. Findings consistent with CHF. Slight improvement from prior exam IMPRESSION: 1.  Right PICC line in stable position. 2. Congestive heart failure bilateral from interstitial edema and bilateral pleural effusions. Slight improvement from prior exam. Electronically Signed   By: Maisie Fus  Register   On: 05/24/2017 13:15        Scheduled Meds: . acetaminophen  1,000 mg Oral Q8H  . albuterol  2.5 mg Nebulization BID  . amiodarone  200 mg Oral Daily  . Chlorhexidine Gluconate Cloth  6 each Topical Daily  . docusate sodium  100 mg Oral BID  . feeding supplement (ENSURE ENLIVE)  237 mL Oral TID BM  . ferrous sulfate  325 mg Oral TID WC  . loratadine  10 mg Oral Daily  . magnesium oxide  200 mg Oral Daily  . midodrine  10 mg Oral TID WC  . multivitamin with minerals  1 tablet Oral Daily  . polyethylene glycol  17 g Oral BID  . rivaroxaban  20 mg Oral Q2000  . tamsulosin  0.4 mg Oral Daily  . tiotropium  18 mcg Inhalation Daily   Continuous Infusions: . sodium chloride 10 mL/hr at 05/16/17 1437  . meropenem (MERREM) IV Stopped (05/24/17 1202)  . vancomycin Stopped (05/24/17 0904)     LOS: 9 days    Time spent: over 30 min    Lacretia Nicks, MD [facility] Pager [phone]  If 7PM-7AM, please contact night-coverage [email or web address] Password [facility] 05/24/2017, 5:39 PM

## 2017-05-24 NOTE — Progress Notes (Signed)
Pt. ready to be placed on CPAP for h/s, RN aware, humidifier filled, oxygen placed inline at 2 lpm, tolerating well.

## 2017-05-24 NOTE — Progress Notes (Addendum)
LCSW following for return to facility.  Patient is from [facility]. Went in for short term SNF in Oct. Patients infection returned. He became weaker and returned to the hospital. Plan is to return to SNF short term at dc.   Patient will need new PT/OT consult.   Patient not medically ready today. Maybe 1-2 days per MD.  LCSW spoke with daughter, Corrie Dandy by phone and provided update.  LCSW provided update to facility.     LCSW will continue to follow.   Beulah Gandy [facility] CSW [phone]

## 2017-05-25 DIAGNOSIS — I5033 Acute on chronic diastolic (congestive) heart failure: Secondary | ICD-10-CM

## 2017-05-25 DIAGNOSIS — R4182 Altered mental status, unspecified: Secondary | ICD-10-CM

## 2017-05-25 LAB — BASIC METABOLIC PANEL WITH GFR
Anion gap: 8 (ref 5–15)
BUN: 34 mg/dL — ABNORMAL HIGH (ref 6–20)
CO2: 44 mmol/L — ABNORMAL HIGH (ref 22–32)
Calcium: 8.7 mg/dL — ABNORMAL LOW (ref 8.9–10.3)
Chloride: 84 mmol/L — ABNORMAL LOW (ref 101–111)
Creatinine, Ser: 0.76 mg/dL (ref 0.61–1.24)
GFR calc Af Amer: >60 mL/min
GFR calc non Af Amer: >60 mL/min
Glucose, Bld: 92 mg/dL (ref 65–99)
Potassium: 3.5 mmol/L (ref 3.5–5.1)
Sodium: 136 mmol/L (ref 135–145)

## 2017-05-25 LAB — CBC
HCT: 28 % — ABNORMAL LOW (ref 39.0–52.0)
Hemoglobin: 8.9 g/dL — ABNORMAL LOW (ref 13.0–17.0)
MCH: 29.4 pg (ref 26.0–34.0)
MCHC: 31.8 g/dL (ref 30.0–36.0)
MCV: 92.4 fL (ref 78.0–100.0)
PLATELETS: 333 10*3/uL (ref 150–400)
RBC: 3.03 MIL/uL — ABNORMAL LOW (ref 4.22–5.81)
RDW: 15.4 % (ref 11.5–15.5)
WBC: 8 10*3/uL (ref 4.0–10.5)

## 2017-05-25 LAB — MAGNESIUM: Magnesium: 2.5 mg/dL — ABNORMAL HIGH (ref 1.7–2.4)

## 2017-05-25 MED ORDER — SODIUM CHLORIDE 0.9 % IV BOLUS (SEPSIS)
250.0000 mL | Freq: Once | INTRAVENOUS | Status: AC
  Administered 2017-05-25: 250 mL via INTRAVENOUS

## 2017-05-25 MED ORDER — FUROSEMIDE 10 MG/ML IJ SOLN
40.0000 mg | Freq: Every day | INTRAMUSCULAR | Status: DC
  Administered 2017-05-25 – 2017-05-28 (×4): 40 mg via INTRAVENOUS
  Filled 2017-05-25 (×4): qty 4

## 2017-05-25 NOTE — Consult Note (Signed)
Cardiology Consultation:   Patient ID: [name]; [id number]; 24-Feb-1927   Admit date: 05/14/2017 Date of Consult: 05/25/2017  Primary Care Provider: [name], MD Primary Cardiologist: Chrystie Nose, MD  Primary Electrophysiologist:  NA   Patient Profile:   [name] is a 84 y.o. male with a hx of persistent AF on amiodarone an Xarelto, chronic RBBB and AS with hx diastolic HF who is being seen today for the evaluation of CHF at the request of Dr. Jarvis Newcomer..  History of Present Illness:   [name] a hx of persistent AF on amiodarone an Xarelto, chronic RBBB and AS with hx diastolic HF.  Remote 2014 he had EF of 40% with a fib but otherwise EF has been normal.   His last nuc  10/29/16 with EF 64% and low risk study and recent echo 05/19/17 with mild LVH and EF 60-65%, in coordinate septal motion, G1DD,  AS -moderate and valve area Vmax 1.12, trivial MR, LA was normal in size , RA moderately dilated, RV was mildly dilated TR, PA pk 42 mmHg and trivial pericardial effusion post to the heart, not consistent with tamponade physiology.  IVC is dilated.   Newer hx of sacral osteomyelitis Sept/Oct and treated.  In Dec found to have a necrotic bone in his coccyx.    Bone cultures grew group B streptococcus, diphtheroids, E. coli, and MRSA a PICC was placed again and pt on aBX.     Pt admitted 05/14/17 with AMS --he was somnolent after he was waken.  Low grade temp BP in 90s to 100s.  CXR with small Lt pl effusion with patchy opacities in RLL and LLL--Dx with acute encephalopathy,  Chronic diastolic HF on home bumex, HLD on statin,   Chronic resp. Failure and discharged with oxygen in Oct.  Coccygeal osteomyelitis.    I do not find admit EKG but on tele he was in SR with PACs and RBBB. EKG today I have personally reviewed SR with RBBB and LAFB and no acute changes, + PACs Tele:  I personally reviewed SR with BBB and PVCs and PACs.-- I did not see any A fib  BNP on 05/19/17 was 825     CXR 05/24/17:  Congestive heart failure bilateral from interstitial edema and bilateral pleural effusions. Slight improvement from prior exam. Echo as above.  todays labs with K+ 3.5  Hgb 8.9 Mg+ 2.5  Now with CHF Was started on lasix on the 8th.  wt at discharge in Oct 203 lbs, on admit 200 lbs and today 211 lbs.    He has diuresed currently neg 7685. But lasix stopped due to hypotension none for 2 days.    Today no chest pain, does complain of chest wall pain just below diaphragm + point tenderness but electrode is at site.  Will ask nurse to place in different area so he is not resting on in it when he is on side.   He thought he had a broken rib.  His breathing is stable, no SOB.  Past Medical History:  Diagnosis Date  . A-fib (HCC)   . Acute systolic CHF (congestive heart failure), NYHA class 3 -- Unclear etilogy (? Afib related) EF down from 60-70% to 40%. 07/05/2012   Echo 2/21: LV upper limits of normal. EF- 40%. Cannot assess Diastolic function - Aortic Sclerosis, Mod-Severe Left Atrial dilation; Mild RV & RA dilation; Moderately elevated PA peak pressure: 43mm Hg     . Arthritis  knees  . Asthma   . ASTHMA 02/06/2007   Qualifier: Diagnosis of  By: Claiborne Billings CMA, Terance Ice    . BENIGN PROSTATIC HYPERTROPHY 05/13/2010   Qualifier: Diagnosis of  By: Amador Cunas  MD, Janett Labella   . Bifascicular block 12/18/2013  . BPH (benign prostatic hypertrophy)   . Bronchospasm, exercise-induced   . CHF (congestive heart failure) (HCC)    systolic, EF 40% (07/07/2012)  . Chronic kidney disease    stabilized, due to infection  . Chronic rhinitis 06/02/2009   Qualifier: Diagnosis of  By: Lovell Sheehan MD, Balinda Quails   . DJD (degenerative joint disease), lumbar 01/18/2012  . Dyslipidemia   . Edema    Bilateral - left lower leg greater than right- wears compression stockings  . General weakness 07/05/2012  . GERD (gastroesophageal reflux disease)   . History of elevated glucose 10/01/2009   Qualifier:  Diagnosis of  By: Lovell Sheehan MD, Balinda Quails   . Hx of colonic polyps 11/02/2011  . Hypertension   . INSOMNIA, CHRONIC, MILD 01/02/2008   Qualifier: Diagnosis of  By: Lovell Sheehan MD, Balinda Quails   . Macular degeneration 08-06-13   bilateral -sight impaired  . Obesity   . Paroxysmal Atrial fibrillation - admitted with RVR 01/18/2012  . S/P left THA, AA 08/13/2013  . Shortness of breath     Past Surgical History:  Procedure Laterality Date  . CARDIOVERSION N/A 07/07/2012   Procedure: CARDIOVERSION;  Surgeon: Chrystie Nose, MD;  Location: [facility] ENDOSCOPY;  Service: Cardiovascular;  Laterality: N/A;  . CARDIOVERSION N/A 08/30/2012   Procedure: CARDIOVERSION;  Surgeon: Chrystie Nose, MD;  Location: [facility] ENDOSCOPY;  Service: Cardiovascular;  Laterality: N/A;  . IR FLUORO GUIDE CV LINE RIGHT  04/15/2017  . IR US GUIDE VASC ACCESS RIGHT  04/15/2017  . IRRIGATION AND DEBRIDEMENT ABSCESS N/A 02/08/2017   Procedure: IRRIGATION AND DEBRIDEMENT SACRAL ULCER;  Surgeon: Berna Bue, MD;  Location: [facility] ORS;  Service: General;  Laterality: N/A;  . TOTAL HIP ARTHROPLASTY Left 08/13/2013   Procedure: LEFT TOTAL HIP ARTHROPLASTY ANTERIOR APPROACH;  Surgeon: [name], MD;  Location: [facility] ORS;  Service: Orthopedics;  Laterality: Left;  . TOTAL KNEE ARTHROPLASTY Left 01/24/2017   Procedure: LEFT TOTAL KNEE ARTHROPLASTY;  Surgeon: Durene Romans, MD;  Location: [facility] ORS;  Service: Orthopedics;  Laterality: Left;  90 mins  . TRANSTHORACIC ECHOCARDIOGRAM  07/07/2012   ef 40%; mild MR; LA mod-severely dilated; RA mildly dilated; RV systolic function mildly reduced; RV systolic pressure increase - mod pulm htn  . VASCULAR SURGERY  removed varicose veins  . VASECTOMY         Inpatient Medications: Scheduled Meds: . acetaminophen  1,000 mg Oral Q8H  . albuterol  2.5 mg Nebulization BID  . amiodarone  200 mg Oral Daily  . Chlorhexidine Gluconate Cloth  6 each Topical Daily  . docusate sodium  100 mg Oral BID  . feeding  supplement (ENSURE ENLIVE)  237 mL Oral TID BM  . ferrous sulfate  325 mg Oral TID WC  . loratadine  10 mg Oral Daily  . midodrine  10 mg Oral TID WC  . multivitamin with minerals  1 tablet Oral Daily  . polyethylene glycol  17 g Oral BID  . rivaroxaban  20 mg Oral Q2000  . tamsulosin  0.4 mg Oral Daily  . tiotropium  18 mcg Inhalation Daily   Continuous Infusions: . sodium chloride 10 mL/hr at 05/16/17 1437  . meropenem (MERREM) IV Stopped (  05/25/17 0333)  . vancomycin 750 mg (05/25/17 0833)   PRN Meds: albuterol, ondansetron **OR** ondansetron (ZOFRAN) IV, sodium chloride flush, traMADol  Allergies:    Allergies  Allergen Reactions  . Nsaids     Stomach pain  . Vicodin [Hydrocodone-Acetaminophen] Other (See Comments)    Unknown reaction many years ago  . Morphine And Related Nausea And Vomiting    Dizziness, light headed. "Opiates cause tightness in chest".    Social History:   Social History   Socioeconomic History  . Marital status: Widowed    Spouse name: Not on file  . Number of children: Not on file  . Years of education: Not on file  . Highest education level: Not on file  Social Needs  . Financial resource strain: Not on file  . Food insecurity - worry: Not on file  . Food insecurity - inability: Not on file  . Transportation needs - medical: Not on file  . Transportation needs - non-medical: Not on file  Occupational History  . Occupation: retired    Associate Professor: RETIRED  Tobacco Use  . Smoking status: Former Smoker    Last attempt to quit: 05/17/1964    Years since quitting: 53.0  . Smokeless tobacco: Never Used  Substance and Sexual Activity  . Alcohol use: No  . Drug use: No  . Sexual activity: Not Currently  Other Topics Concern  . Not on file  Social History Narrative   Admitted to [facility]/28/17   Widowed   Former smoker-stopped 1966   Alcohol  None   Full Code    Family History:    Family History  Problem Relation Age  of Onset  . Alzheimer's disease Mother   . Heart disease Father      ROS:  Please see the history of present illness.  ROS  General:no colds or fevers, + weight changes Skin:no rashes + cocyxx ulcers HEENT:no blurred vision, no congestion CV:see HPI PUL:see HPI GI:no diarrhea constipation or melena, no indigestion GU:no hematuria, no dysuria MS:no joint pain, no claudication Neuro:no syncope, no lightheadedness Endo:no diabetes, no thyroid disease     Physical Exam/Data:   Vitals:   05/25/17 0608 05/25/17 0738 05/25/17 0839 05/25/17 0841  BP: (!) 82/46   (!) 80/53  Pulse: 71     Resp:      Temp:      TempSrc:      SpO2:  93% (!) 82% 93%  Weight:      Height:        Intake/Output Summary (Last 24 hours) at 05/25/2017 0844 Last data filed at 05/25/2017 0610 Gross per 24 hour  Intake 591.84 ml  Output 850 ml  Net -258.16 ml   Filed Weights   05/23/17 0551 05/24/17 0418 05/25/17 0500  Weight: 207 lb 10.8 oz (94.2 kg) 204 lb 2.3 oz (92.6 kg) 211 lb 3.2 oz (95.8 kg)   Body mass index is 32.11 kg/m.  General:  Well nourished, well developed, in no acute distress, very nice gentleman HEENT: normal Lymph: no adenopathy Neck: + JVD Endocrine:  No thryomegaly Vascular: No carotid bruits; 2+ pedal pulses bil  Cardiac:  normal S1, S2; RRR; 2/6 systolic murmur no gallup rub or click Lungs:  clear to auscultation bilaterally, no wheezing, rhonchi or rales somewhat diminished in bases Abd: soft, nontender generally. + tenderness Lt upper quad tenderness, no hepatomegaly - abd larger possibly due to HF Ext: mild edema. + varicosities  Musculoskeletal:  No deformities, BUE and BLE strength normal and equal Skin: warm and dry  Neuro:  Alert and oriented  X 3 MAE, follows commands, no focal abnormalities noted Psych:  Normal affect     Relevant CV Studies: Stress test 10/29/16  Study Highlights     The left ventricular ejection fraction is normal (55-65%).  Nuclear  stress EF: 64%.  There was no ST segment deviation noted during stress.  The study is normal.  This is a low risk study.   Normal resting and stress perfusion. No ischemia or infarction EF 64%    ECHO 05/19/17 Study Conclusions  - Left ventricle: The cavity size was normal. Wall thickness was   increased in a pattern of mild LVH. Systolic function was normal.   The estimated ejection fraction was in the range of 60% to 65%.   Incoordinate septal motion. Doppler parameters are consistent   with abnormal left ventricular relaxation (grade 1 diastolic   dysfunction). The E/e&' ratio is between 8-15, suggesting   indeterminate LV filling pressure. - Aortic valve: Calcified leaflets. Moderate stenosis. Mean   gradient (S): 16 mm Hg. Peak gradient (S): 27 mm Hg. Valve area   (VTI): 1.08 cm^2. Valve area (Vmax): 1.12 cm^2. - Mitral valve: Mildly thickened leaflets . There was trivial   regurgitation. - Left atrium: The atrium was normal in size. - Right ventricle: The cavity size was mildly dilated. Systolic   function was normal. - Right atrium: Moderately dilated. - Tricuspid valve: There was mild regurgitation. - Pulmonary arteries: PA peak pressure: 42 mm Hg (S). - Inferior vena cava: The vessel was dilated. The respirophasic   diameter changes were blunted (< 50%), consistent with elevated   central venous pressure. - Pericardium, extracardiac: A trivial pericardial effusion was   identified posterior to the heart. Features were not consistent   with tamponade physiology.  Impressions:  - Compared to a prior study in 08/2015, the LVEF is higher at 60-65%   with incoordinate septal motion. The aortic stenosis is now   moderate. There is mild RVE and moderate RAE with mild TR and   RVSP of 42 mmHg, the IVC is dilated.   Laboratory Data:  Chemistry Recent Labs  Lab 05/22/17 0345 05/24/17 0525 05/25/17 0356  NA 140 138 136  K 3.5 3.4* 3.5  CL 84* 83* 84*  CO2  47* 46* 44*  GLUCOSE 100* 94 92  BUN 26* 33* 34*  CREATININE 0.89 0.86 0.76  CALCIUM 8.6* 8.7* 8.7*  GFRNONAA >60 >60 >60  GFRAA >60 >60 >60  ANIONGAP 9 9 8     No results for input(s): PROT, ALBUMIN, AST, ALT, ALKPHOS, BILITOT in the last 168 hours. Hematology Recent Labs  Lab 05/22/17 0345 05/24/17 0525 05/25/17 0356  WBC 7.8 7.9 8.0  RBC 3.19* 2.98* 3.03*  HGB 9.3* 8.7* 8.9*  HCT 30.0* 28.0* 28.0*  MCV 94.0 94.0 92.4  MCH 29.2 29.2 29.4  MCHC 31.0 31.1 31.8  RDW 15.1 15.4 15.4  PLT 271 317 333   Cardiac EnzymesNo results for input(s): TROPONINI in the last 168 hours. No results for input(s): TROPIPOC in the last 168 hours.  BNP Recent Labs  Lab 05/19/17 0458  BNP 824.0*    DDimer No results for input(s): DDIMER in the last 168 hours.  Radiology/Studies:  Dg Chest Port 1 View  Result Date: 05/24/2017 CLINICAL DATA:  Shortness of breath.  COPD. EXAM: PORTABLE CHEST 1 VIEW COMPARISON:  05/19/2017. FINDINGS: PICC line noted  with tip over the superior vena cava. Cardiomegaly with mild bilateral vascular prominence interstitial prominence. Bilateral pleural effusions. Findings consistent with CHF. Slight improvement from prior exam IMPRESSION: 1.  Right PICC line in stable position. 2. Congestive heart failure bilateral from interstitial edema and bilateral pleural effusions. Slight improvement from prior exam. Electronically Signed   By: Maisie Fus  Register   On: 05/24/2017 13:15    Assessment and Plan:   1. Acute on chronic diastolic HF wt is up 8 lbs placed on IV lasix from po bumex for 5 days last day the 7th.   Stopped due to hypotension. Currently neg 7485 Cr is stable at 0.76 ? Add spironolactone if BP tolerates. 2. Hypotension with BP 80/53 to 106/40 --he is on midodrine  3. AS moderate may be playing a role as well. 4. Persistent a fib, maintaining SR with PVCs and PACs and is on Xarelto with CHA2DS2VASc score of 4, continue amiodarone at 200 mg daily, at higher doses  he did have bradycardia in past. 5. 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 Partial code  7. No prior hx of CAD and neg nuc study in 10/2016  8. Lt upper abd point tenderness 9. Hypokalemia  10. Anemia with hgb at 8.9 today    For questions or updates, please contact [facility] Please consult [email or web address] for contact info under Cardiology/STEMI.   Signed, Nada Boozer, NP  05/25/2017 8:44 AM   As above, patient seen and examined.  Briefly he is an 84 year old male with past medical history of paroxysmal atrial fibrillation on chronic amiodarone, mild to moderate aortic stenosis, diastolic congestive heart failure, chronic hypotension, sacral osteomyelitis for evaluation of acute on chronic diastolic congestive heart failure.  Most recent echocardiogram on January 13 showed normal LV function, grade 1 diastolic dysfunction, mild to moderate aortic stenosis with mean gradient 16 mmHg, right atrial and right ventricular enlargement.  Patient has been treated intermittently for sacral osteomyelitis.  He was admitted on December 29 with altered mental status and low-grade fever.  He developed increasing volume and was placed on IV Lasix.  His blood pressure has been borderline and cardiology asked to evaluate.  Patient denies dyspnea or chest pain. On exam he is mildly volume overloaded.  There is a 2/6 systolic murmur.  S2 is not diminished.  Chest x-ray yesterday showed CHF.  Laboratories show sodium 136, potassium 3.5, BUN 34 and creatinine 0.76.  Electrocardiogram shows sinus rhythm, PACs, left anterior fascicular block and right bundle branch block.   1 acute on chronic diastolic congestive heart failure-patient is mildly volume overloaded.  I will resume Lasix 40 mg IV daily and follow renal function.  Would check albumin.  Question contribution from low oncotic pressure/decreased albumin from poor nutrition.  2 hypotension-would continue midodrine.  3 mild to  moderate aortic stenosis on most recent echocardiogram.  4 paroxysmal atrial fibrillation-patient remains in sinus rhythm.  Continue amiodarone and Xarelto.  5 osteomyelitis-continue antibiotics per primary care.  Olga Millers, MD

## 2017-05-25 NOTE — Progress Notes (Signed)
PT Cancellation Note  Patient Details Name: [name] MRN: [id number] DOB: 02/09/1927   Cancelled Treatment:    Reason Eval/Treat Not Completed: Patient at procedure or test/unavailable(pt getting EKG. Will attempt later today. )   [name] 05/25/2017, 9:45 AM [phone]

## 2017-05-25 NOTE — Progress Notes (Signed)
SLP Cancellation Note  Patient Details Name: [name] MRN: [id number] DOB: 1926/10/13   Cancelled treatment:       Reason Eval/Treat Not Completed: Other (comment)(pt receiving care from nurse and nurse tech, will continue efforts)   Mills Koller, MS [facility] SLP [phone]

## 2017-05-25 NOTE — Progress Notes (Signed)
PROGRESS NOTE    [name]  ZOX:[id number] DOB: June 23, 1926 DOA: 05/14/2017 PCP: [name], MD   Brief Narrative:  84 y.o.malewith medical history ofpersistent atrial fibrillation, systolic and diastolic CHF, COPD, coccygeal osteomyelitis, hyperlipidemia presenting with fluctuating mental status since May 11, 2017. The patient was recently discharged from the hospital after a stay from 02/07/17 through 02/21/17 when he was treated for sepsis secondary to sacral osteomyelitis. A PICC line was placed, and the patient was discharged with antibiotics to finish on 03/22/2017. His PICC line was subsequently removed after he finished his antibiotics. He continued to receive wound care. During the wound care visit on 04/20/2017, the patient was noted to have necrotic bone in his coccyx. Bone biopsy and cultures were sent and were consistent with osteomyelitis. Bone cultures grew group B streptococcus, diphtheroids, E. coli, and MRSA a PICC line was placed on 03/29/2017, the patient was started on vancomycin and ertapenem With instructions to finish on 06/07/2017.The patient was doing fairly well up until the last 3 days when his family noted that he has been having intermittent episodes of somnolence and difficulty to arouse. In between these episodes, the patient will be fully lucid and able to speak with clarity. On the morning of 05/14/2017, the patient was difficult to arouse, and was somnolent after he was awoken. As result, the patient was transferred to the emergency department for further evaluation.  Assessment & Plan:  Active Problems:   Hypotension   Osteomyelitis of coccyx (HCC)   Acute encephalopathy   Permanent atrial fibrillation (HCC)   Chronic respiratory failure with hypoxia (HCC)   Chronic combined systolic and diastolic CHF (congestive heart failure) (HCC)  Acute toxic metabolic encephalopathy: Likely due to hypercarbia in setting of chronic hypoxic  respiratory failure and acute worsening w/volume overload. Discussed with daughter that I doubt abx played a role, consistent with ID's opinion. Initially concern for UTI (but no growth) and concern for seizure (due to tramadol or ertapenem) - EEG was done and was notable for mild to moderate global slowing indicating global encephalopathy, typically seen in toxic metabolic process  - Continue to use BiPAP and optimize volume status, continue abx as below for coccygeal osteomyelitis.   Coccygeal osteomyelitis: Bone biopsy and culture 04/20/2017 grew GBS, diphtheroids, E. coli, and MRSA (E. coli sensitive to cefepime, imipenem, bactrim) - PICC line was placed on 03/29/2017, the patient was started on vancomycin and ertapenem with instructions to finish on 06/07/2017. Transitioned to meropenem while inpatient (formulary), will switch to ertapenem at discharge.  Chronic respiratory failure with hypoxia  Hypercarbia: Intermittently on oxygen 1-2 L at SNF - Continue BiPAP qHS, strongly suspect OSA due to daughter's reports of observed startling following apnea.   Acute on Chronic systolic and diastolic CHF: EDW thought to be ~203lbs, currently above that with pulmonary edema on CXR. ECHO from this admission with EF 60-65%, incoordinate septal motion, moderate AS  - Was on bumex at home, but started lasix 60 mg BID for HF exacerbation, limited by hypotension.  - Appreciate cardiology input, will cautiously resume IV lasix and monitor BP.    - Monitor daily weights and I/O's  Hypotension: Chronic, on prn midodrine.  - Schedule midodrine    Permanent atrial fibrillation: Maintaining NSR - Continue xarelto (CHA2DS2-VASc 4) and amiodarone  COPD: Mild wheezing. Hoping to avoid steroids w/CHF.  - Breathing treatments, BiPAP as above.  - Continue Spiriva  Hyperlipidemia - Continue statin   Obesity  - Body mass index is  35.3 kg/m  Penile and scrotal edema - pt seen by urology  - Foley cath  replaced  Acute kidney injury - resolved with hydration, no holding IVF's  DVT prophylaxis: Xarelto Code Status: Partial (DNI) Family Communication: Daughter at bedside Disposition Plan: SNF pending improvement, still requires diuresis.   Consultants:   ID by phone  Cardiology  Procedures:  Stress test 10/29/16  Study Highlights     The left ventricular ejection fraction is normal (55-65%).  Nuclear stress EF: 64%.  There was no ST segment deviation noted during stress.  The study is normal.  This is a low risk study.  Normal resting and stress perfusion. No ischemia or infarction EF 64%    ECHO 05/19/17 Study Conclusions  - Left ventricle: The cavity size was normal. Wall thickness was increased in a pattern of mild LVH. Systolic function was normal. The estimated ejection fraction was in the range of 60% to 65%. Incoordinate septal motion. Doppler parameters are consistent with abnormal left ventricular relaxation (grade 1 diastolic dysfunction). The E/e&' ratio is between 8-15, suggesting indeterminate LV filling pressure. - Aortic valve: Calcified leaflets. Moderate stenosis. Mean gradient (S): 16 mm Hg. Peak gradient (S): 27 mm Hg. Valve area (VTI): 1.08 cm^2. Valve area (Vmax): 1.12 cm^2. - Mitral valve: Mildly thickened leaflets . There was trivial regurgitation. - Left atrium: The atrium was normal in size. - Right ventricle: The cavity size was mildly dilated. Systolic function was normal. - Right atrium: Moderately dilated. - Tricuspid valve: There was mild regurgitation. - Pulmonary arteries: PA peak pressure: 42 mm Hg (S). - Inferior vena cava: The vessel was dilated. The respirophasic diameter changes were blunted (<50%), consistent with elevated central venous pressure. - Pericardium, extracardiac: A trivial pericardial effusion was identified posterior to the heart. Features were not consistent with  tamponade physiology.  Impressions:  - Compared to a prior study in 08/2015, the LVEF is higher at 60-65% with incoordinate septal motion. The aortic stenosis is now moderate. There is mild RVE and moderate RAE with mild TR and RVSP of 42 mmHg, the IVC is dilated.  Antimicrobials:  Anti-infectives (From admission, onward)   Start     Dose/Rate Route Frequency Ordered Stop   05/21/17 0800  vancomycin (VANCOCIN) IVPB 750 mg/150 ml premix     750 mg 150 mL/hr over 60 Minutes Intravenous Every 24 hours 05/20/17 1321     05/20/17 1200  meropenem (MERREM) 1 g in sodium chloride 0.9 % 100 mL IVPB     1 g 200 mL/hr over 30 Minutes Intravenous Every 8 hours 05/19/17 1847     05/19/17 1215  piperacillin-tazobactam (ZOSYN) IVPB 3.375 g  Status:  Discontinued     3.375 g 12.5 mL/hr over 240 Minutes Intravenous Every 8 hours 05/19/17 1202 05/19/17 1836   05/16/17 0800  vancomycin (VANCOCIN) IVPB 1000 mg/200 mL premix  Status:  Discontinued     1,000 mg 200 mL/hr over 60 Minutes Intravenous Every 24 hours 05/16/17 0728 05/20/17 1321   05/15/17 0800  ertapenem (INVANZ) 1 g in sodium chloride 0.9 % 50 mL IVPB  Status:  Discontinued     1 g 100 mL/hr over 30 Minutes Intravenous Every 24 hours 05/14/17 2050 05/19/17 1150   05/14/17 1715  ceFEPIme (MAXIPIME) 2 g in dextrose 5 % 50 mL IVPB     2 g 100 mL/hr over 30 Minutes Intravenous  Once 05/14/17 1701 05/14/17 1941   05/14/17 1715  vancomycin (VANCOCIN) 1,500 mg in sodium  chloride 0.9 % 500 mL IVPB  Status:  Discontinued     1,500 mg 250 mL/hr over 120 Minutes Intravenous  Once 05/14/17 1706 05/14/17 1721     Subjective: Confusion is stable, actually more near his baseline than previously. No pain. Gets short of breath, but doesn't spontaneously admit to this until daughter reminds him. Says he's still much more short of breath when laying back.   Objective: Vitals:   05/25/17 0839 05/25/17 0841 05/25/17 0952 05/25/17 1515  BP:  (!)  80/53 (!) 106/40 106/60  Pulse:    75  Resp:      Temp:    98.3 F (36.8 C)  TempSrc:    Oral  SpO2: (!) 82% 93% 96% 95%  Weight:      Height:       Intake/Output Summary (Last 24 hours) at 05/25/2017 1736 Last data filed at 05/25/2017 1549 Gross per 24 hour  Intake 850 ml  Output 2450 ml  Net -1600 ml   Filed Weights   05/23/17 0551 05/24/17 0418 05/25/17 0500  Weight: 94.2 kg (207 lb 10.8 oz) 92.6 kg (204 lb 2.3 oz) 95.8 kg (211 lb 3.2 oz)   Examination: Gen: Pleasantly confused elderly male in NAD HEENT: MMM, posterior oropharynx clear Pulm: Non-labored; end-expiratory wheezes and basilar rales noted. CV: Regular rate, no murmur appreciated; distal pulses intact/symmetric. 1+ dependent edema.  GI: + BS; soft, non-tender, non-distended Neuro: Alert, thought he was at [facility], not sure of the date, CN II-XII without deficits Skin: sacrum not examined this morning  CBC: Recent Labs  Lab 05/20/17 0340 05/21/17 0503 05/22/17 0345 05/24/17 0525 05/25/17 0356  WBC 6.1 7.2 7.8 7.9 8.0  HGB 9.3* 9.6* 9.3* 8.7* 8.9*  HCT 30.8* 32.0* 30.0* 28.0* 28.0*  MCV 97.2 95.8 94.0 94.0 92.4  PLT 235 248 271 317 333   Basic Metabolic Panel: Recent Labs  Lab 05/20/17 0340 05/21/17 0503 05/22/17 0345 05/24/17 0525 05/25/17 0356  NA 141 140 140 138 136  K 3.8 3.2* 3.5 3.4* 3.5  CL 89* 87* 84* 83* 84*  CO2 44* 46* 47* 46* 44*  GLUCOSE 104* 113* 100* 94 92  BUN 27* 24* 26* 33* 34*  CREATININE 0.91 0.88 0.89 0.86 0.76  CALCIUM 8.6* 8.4* 8.6* 8.7* 8.7*  MG  --  1.9  --   --  2.5*   GFR: Estimated Creatinine Clearance: 71.7 mL/min (by C-G formula based on SCr of 0.76 mg/dL).   Radiology Studies: Dg Chest Port 1 View  Result Date: 05/24/2017 CLINICAL DATA:  Shortness of breath.  COPD. EXAM: PORTABLE CHEST 1 VIEW COMPARISON:  05/19/2017. FINDINGS: PICC line noted with tip over the superior vena cava. Cardiomegaly with mild bilateral vascular prominence interstitial prominence.  Bilateral pleural effusions. Findings consistent with CHF. Slight improvement from prior exam IMPRESSION: 1.  Right PICC line in stable position. 2. Congestive heart failure bilateral from interstitial edema and bilateral pleural effusions. Slight improvement from prior exam. Electronically Signed   By: Maisie Fus  Register   On: 05/24/2017 13:15   Time spent: 25 min  Hazeline Junker, MD [facility] Pager [phone]   If 7PM-7AM, please contact night-coverage [email or web address] Password [facility] 05/25/2017, 5:36 PM

## 2017-05-25 NOTE — Evaluation (Signed)
Physical Therapy Evaluation Patient Details Name: [name] MRN: [id number] DOB: 05-20-1926 Today's Date: 05/25/2017   History of Present Illness  84 y.o. male with medical history of persistent atrial fibrillation, systolic and diastolic CHF, COPD, coccygeal osteomyelitis, hyperlipidemia presenting with fluctuating mental status since May 11, 2017.  The patient was recently discharged from the hospital after a stay from 02/07/17 through 02/21/17 when he was treated for sepsis secondary to sacral osteomyelitis. Pt admitted with somnolence, dx of acute encephalopathy.  Clinical Impression  Pt admitted with above diagnosis. Pt currently with functional limitations due to the deficits listed below (see PT Problem List). +2 total assist for rolling R and L x 3 each for pericare. Performed BLE exercises with mod assist for strengthening.  Pt will benefit from skilled PT to increase their independence and safety with mobility to allow discharge to the venue listed below.       Follow Up Recommendations SNF    Equipment Recommendations  None recommended by PT    Recommendations for Other Services       Precautions / Restrictions Precautions Precautions: Fall Restrictions Weight Bearing Restrictions: No      Mobility  Bed Mobility Overal bed mobility: Needs Assistance Bed Mobility: Rolling Rolling: Total assist;+2 for physical assistance         General bed mobility comments: rolled 3x to L and R for pericare 2* large, runny BM, pt attempted to assist by reaching for bedrails with UEs (pt 15%); required total assist of 2 to roll, pt fatigued and c/o L anterior rib pain after multiple trials of rolling so did not attempt further mobility. Instructed pt to brace sore rib with a pillow.   Transfers                    Ambulation/Gait                Stairs            Wheelchair Mobility    Modified Rankin (Stroke Patients Only)       Balance                                              Pertinent Vitals/Pain Faces Pain Scale: Hurts even more Pain Location: L lower chest (pt reports a broken rib) and coccyx wound Pain Descriptors / Indicators: Sore;Sharp Pain Intervention(s): Limited activity within patient's tolerance;Monitored during session;Repositioned    Home Living Family/patient expects to be discharged to:: Skilled nursing facility                      Prior Function Level of Independence: Needs assistance   Gait / Transfers Assistance Needed: was working on standing and walking with PT at SNF prior to admission  ADL's / Homemaking Assistance Needed: needs assistance        Hand Dominance        Extremity/Trunk Assessment   Upper Extremity Assessment Upper Extremity Assessment: Defer to OT evaluation    Lower Extremity Assessment Lower Extremity Assessment: RLE deficits/detail;LLE deficits/detail RLE Deficits / Details: SLR +2/5, knee ext 3/5, ankle DF 4/5 LLE Deficits / Details: SLR +2/5, knee ext 3/5, ankle DF 4/5       Communication   Communication: 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  Cognition Arousal/Alertness: Awake/alert Behavior During Therapy: WFL for tasks assessed/performed Overall Cognitive Status: Within  Functional Limits for tasks assessed                                        General Comments      Exercises General Exercises - Lower Extremity Ankle Circles/Pumps: AROM;Both;10 reps;Supine Heel Slides: AAROM;Both;10 reps;Supine Hip ABduction/ADduction: AAROM;Both;Supine;10 reps   Assessment/Plan    PT Assessment Patient needs continued PT services  PT Problem List Decreased strength;Decreased activity tolerance;Decreased mobility;Decreased skin integrity;Obesity       PT Treatment Interventions Functional mobility training;Therapeutic activities;Therapeutic exercise;Patient/family education    PT Goals (Current goals can be found in the Care Plan  section)  Acute Rehab PT Goals Patient Stated Goal: return to SNF and resume PT PT Goal Formulation: With patient Time For Goal Achievement: 06/08/17 Potential to Achieve Goals: Fair    Frequency Min 2X/week   Barriers to discharge        Co-evaluation               AM-PAC PT "6 Clicks" Daily Activity  Outcome Measure Difficulty turning over in bed (including adjusting bedclothes, sheets and blankets)?: Unable Difficulty moving from lying on back to sitting on the side of the bed? : Unable Difficulty sitting down on and standing up from a chair with arms (e.g., wheelchair, bedside commode, etc,.)?: Unable Help needed moving to and from a bed to chair (including a wheelchair)?: Total Help needed walking in hospital room?: Total Help needed climbing 3-5 steps with a railing? : Total 6 Click Score: 6    End of Session   Activity Tolerance: Patient limited by fatigue;Patient limited by pain Patient left: in bed;with call bell/phone within reach;with nursing/sitter in room;with bed alarm set Nurse Communication: Mobility status;Other (comment)(pt's coccygeal wound dressing is soiled) PT Visit Diagnosis: Muscle weakness (generalized) (M62.81);Difficulty in walking, not elsewhere classified (R26.2);Pain    Time: 9485-4627 PT Time Calculation (min) (ACUTE ONLY): 27 min   Charges:   PT Evaluation $PT Eval Moderate Complexity: 1 Mod PT Treatments $Therapeutic Exercise: 8-22 mins   PT G Codes:         [name] 05/25/2017, 2:08 PM [phone]

## 2017-05-26 DIAGNOSIS — I5042 Chronic combined systolic (congestive) and diastolic (congestive) heart failure: Secondary | ICD-10-CM

## 2017-05-26 LAB — BASIC METABOLIC PANEL
ANION GAP: 9 (ref 5–15)
BUN: 29 mg/dL — ABNORMAL HIGH (ref 6–20)
CHLORIDE: 86 mmol/L — AB (ref 101–111)
CO2: 43 mmol/L — AB (ref 22–32)
Calcium: 8.9 mg/dL (ref 8.9–10.3)
Creatinine, Ser: 0.82 mg/dL (ref 0.61–1.24)
GFR calc non Af Amer: >60 mL/min (ref >60)
Glucose, Bld: 96 mg/dL (ref 65–99)
POTASSIUM: 3.3 mmol/L — AB (ref 3.5–5.1)
SODIUM: 138 mmol/L (ref 135–145)

## 2017-05-26 MED ORDER — POTASSIUM CHLORIDE CRYS ER 20 MEQ PO TBCR
40.0000 meq | EXTENDED_RELEASE_TABLET | Freq: Once | ORAL | Status: AC
  Administered 2017-05-26: 40 meq via ORAL
  Filled 2017-05-26: qty 2

## 2017-05-26 NOTE — Progress Notes (Signed)
Progress Note  Patient Name: [name] Date of Encounter: 05/26/2017  Primary Cardiologist: Chrystie Nose, MD   Subjective   No chest pain,  + productive cough,  abd large.   Inpatient Medications    Scheduled Meds: . acetaminophen  1,000 mg Oral Q8H  . albuterol  2.5 mg Nebulization BID  . amiodarone  200 mg Oral Daily  . Chlorhexidine Gluconate Cloth  6 each Topical Daily  . docusate sodium  100 mg Oral BID  . feeding supplement (ENSURE ENLIVE)  237 mL Oral TID BM  . ferrous sulfate  325 mg Oral TID WC  . furosemide  40 mg Intravenous Daily  . loratadine  10 mg Oral Daily  . midodrine  10 mg Oral TID WC  . multivitamin with minerals  1 tablet Oral Daily  . polyethylene glycol  17 g Oral BID  . rivaroxaban  20 mg Oral Q2000  . tamsulosin  0.4 mg Oral Daily  . tiotropium  18 mcg Inhalation Daily   Continuous Infusions: . sodium chloride 10 mL/hr at 05/16/17 1437  . meropenem (MERREM) IV 1 g (05/26/17 0445)  . vancomycin Stopped (05/26/17 0853)   PRN Meds: albuterol, ondansetron **OR** ondansetron (ZOFRAN) IV, sodium chloride flush, traMADol   Vital Signs    Vitals:   05/25/17 1515 05/25/17 2205 05/25/17 2212 05/26/17 0522  BP: 106/60  (!) 94/43 111/72  Pulse: 75  76 74  Resp:   (!) 26 20  Temp: 98.3 F (36.8 C)  99.7 F (37.6 C) 98.4 F (36.9 C)  TempSrc: Oral  Axillary Oral  SpO2: 95% 96% 94% 94%  Weight:    206 lb 9.1 oz (93.7 kg)  Height:        Intake/Output Summary (Last 24 hours) at 05/26/2017 0856 Last data filed at 05/25/2017 1946 Gross per 24 hour  Intake 440 ml  Output 2700 ml  Net -2260 ml   Filed Weights   05/24/17 0418 05/25/17 0500 05/26/17 0522  Weight: 204 lb 2.3 oz (92.6 kg) 211 lb 3.2 oz (95.8 kg) 206 lb 9.1 oz (93.7 kg)    Telemetry    SR with RBBB and PACS - Personally Reviewed  ECG    No new - Personally Reviewed  Physical Exam   GEN: No acute distress.   Neck: + JVD Cardiac: RRR, 2-3/6 systolic murmurs, no  rubs, or gallops.  Respiratory: bilateral breath sounds to auscultation bilaterally with rhonchi and rales. GI: Soft, nontender, non-distended but full MS: No to mild edema; No deformity. Neuro:  Nonfocal  Psych: Normal affect   Labs    Chemistry Recent Labs  Lab 05/24/17 0525 05/25/17 0356 05/26/17 0429  NA 138 136 138  K 3.4* 3.5 3.3*  CL 83* 84* 86*  CO2 46* 44* 43*  GLUCOSE 94 92 96  BUN 33* 34* 29*  CREATININE 0.86 0.76 0.82  CALCIUM 8.7* 8.7* 8.9  GFRNONAA >60 >60 >60  GFRAA >60 >60 >60  ANIONGAP 9 8 9      Hematology Recent Labs  Lab 05/22/17 0345 05/24/17 0525 05/25/17 0356  WBC 7.8 7.9 8.0  RBC 3.19* 2.98* 3.03*  HGB 9.3* 8.7* 8.9*  HCT 30.0* 28.0* 28.0*  MCV 94.0 94.0 92.4  MCH 29.2 29.2 29.4  MCHC 31.0 31.1 31.8  RDW 15.1 15.4 15.4  PLT 271 317 333    Cardiac EnzymesNo results for input(s): TROPONINI in the last 168 hours. No results for input(s): TROPIPOC in the last 168  hours.   BNPNo results for input(s): BNP, PROBNP in the last 168 hours.   DDimer No results for input(s): DDIMER in the last 168 hours.   Radiology    Dg Chest Port 1 View  Result Date: 05/24/2017 CLINICAL DATA:  Shortness of breath.  COPD. EXAM: PORTABLE CHEST 1 VIEW COMPARISON:  05/19/2017. FINDINGS: PICC line noted with tip over the superior vena cava. Cardiomegaly with mild bilateral vascular prominence interstitial prominence. Bilateral pleural effusions. Findings consistent with CHF. Slight improvement from prior exam IMPRESSION: 1.  Right PICC line in stable position. 2. Congestive heart failure bilateral from interstitial edema and bilateral pleural effusions. Slight improvement from prior exam. Electronically Signed   By: Maisie Fus  Register   On: 05/24/2017 13:15    Cardiac Studies   05/19/17 Echo Study Conclusions  - Left ventricle: The cavity size was normal. Wall thickness was   increased in a pattern of mild LVH. Systolic function was normal.   The estimated  ejection fraction was in the range of 60% to 65%.   Incoordinate septal motion. Doppler parameters are consistent   with abnormal left ventricular relaxation (grade 1 diastolic   dysfunction). The E/e&' ratio is between 8-15, suggesting   indeterminate LV filling pressure. - Aortic valve: Calcified leaflets. Moderate stenosis. Mean   gradient (S): 16 mm Hg. Peak gradient (S): 27 mm Hg. Valve area   (VTI): 1.08 cm^2. Valve area (Vmax): 1.12 cm^2. - Mitral valve: Mildly thickened leaflets . There was trivial   regurgitation. - Left atrium: The atrium was normal in size. - Right ventricle: The cavity size was mildly dilated. Systolic   function was normal. - Right atrium: Moderately dilated. - Tricuspid valve: There was mild regurgitation. - Pulmonary arteries: PA peak pressure: 42 mm Hg (S). - Inferior vena cava: The vessel was dilated. The respirophasic   diameter changes were blunted (< 50%), consistent with elevated   central venous pressure. - Pericardium, extracardiac: A trivial pericardial effusion was   identified posterior to the heart. Features were not consistent   with tamponade physiology.  Impressions:  - Compared to a prior study in 08/2015, the LVEF is higher at 60-65%   with incoordinate septal motion. The aortic stenosis is now   moderate. There is mild RVE and moderate RAE with mild TR and   RVSP of 42 mmHg, the IVC is dilated.  Patient Profile     84 y.o. male with a hx of persistent AF on amiodarone an Xarelto, chronic RBBB and AS with hx diastolic HF now with acute diastolic HF after admit for sacral osteomyelitis.    Assessment & Plan     1. Acute on chronic diastolic HF now on lasix once daily, wt down from 211 yesterday to 206 today.  Currently neg 7545 since admit but yesterday negative 2260 Cr is stable at 0.82 today --continue lasix 2. Hypotension with BP 94/43 to 111/72 --he is on midodrine  3. AS moderate  4. Persistent a fib, maintaining SR with  PVCs and PACs and is on Xarelto with CHA2DS2VASc score of 4, continue amiodarone at 200 mg daily, at higher doses he did have bradycardia in past. 5. 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 Partial code  7. No prior hx of CAD and neg nuc study in 10/2016  8. Lt upper abd point tenderness improved 9. Hypokalemia replace per IM 10. Anemia with hgb at 8.9 yesterday      For questions or  updates, please contact [facility] Please consult [email or web address] for contact info under Cardiology/STEMI.      Signed, Nada Boozer, NP  05/26/2017, 8:56 AM   As above, patient seen and examined.  Patient denies dyspnea or chest pain this morning.  Good diuresis with 40 of Lasix yesterday.  We will continue with that dose.  His blood pressure is stable and we will continue with midodrine.  Other issues per primary care. Olga Millers, MD

## 2017-05-26 NOTE — Progress Notes (Addendum)
PROGRESS NOTE    [name]  ZOX:[id number] DOB: 12/02/1926 DOA: 05/14/2017 PCP: [name], MD   Brief Narrative:  84 y.o.malewith medical history ofpersistent atrial fibrillation, systolic and diastolic CHF, COPD, coccygeal osteomyelitis, hyperlipidemia presenting with fluctuating mental status since May 11, 2017. The patient was recently discharged from the hospital after a stay from 02/07/17 through 02/21/17 when he was treated for sepsis secondary to sacral osteomyelitis. A PICC line was placed, and the patient was discharged with antibiotics to finish on 03/22/2017. His PICC line was subsequently removed after he finished his antibiotics. He continued to receive wound care. During the wound care visit on 04/20/2017, the patient was noted to have necrotic bone in his coccyx. Bone biopsy and cultures were sent and were consistent with osteomyelitis. Bone cultures grew group B streptococcus, diphtheroids, E. coli, and MRSA a PICC line was placed on 03/29/2017, the patient was started on vancomycin and ertapenem With instructions to finish on 06/07/2017.The patient was doing fairly well up until the last 3 days when his family noted that he has been having intermittent episodes of somnolence and difficulty to arouse. In between these episodes, the patient will be fully lucid and able to speak with clarity. On the morning of 05/14/2017, the patient was difficult to arouse, and was somnolent after he was awoken. As result, the patient was transferred to the emergency department for further evaluation.  Assessment & Plan:  Active Problems:   Hypotension   Osteomyelitis of coccyx (HCC)   Acute encephalopathy   Permanent atrial fibrillation (HCC)   Chronic respiratory failure with hypoxia (HCC)   Chronic combined systolic and diastolic CHF (congestive heart failure) (HCC)  Acute toxic metabolic encephalopathy: Likely due to hypercarbia in setting of chronic hypoxic  respiratory failure and acute worsening w/volume overload. Discussed with daughter that I doubt abx played a role, consistent with ID's opinion. Initially concern for UTI (but no growth) and concern for seizure (due to tramadol or ertapenem) - EEG was done and was notable for mild to moderate global slowing indicating global encephalopathy, typically seen in toxic metabolic process  - Continue to use BiPAP and optimize volume status, continue abx as below for coccygeal osteomyelitis.  - Delirium precautions.  Coccygeal osteomyelitis: Bone biopsy and culture 04/20/2017 grew GBS, diphtheroids, E. coli, and MRSA (E. coli sensitive to cefepime, imipenem, bactrim) - PICC line was placed on 03/29/2017, the patient was started on vancomycin and ertapenem with instructions to finish on 06/07/2017. Transitioned to meropenem while inpatient (formulary), will switch to ertapenem at discharge.  Chronic respiratory failure with hypoxia/hypercarbia: Intermittently on oxygen 1-2 L at SNF. - Continue BiPAP qHS, strongly suspect OSA due to daughter's reports of observed startling following apnea. Pt tolerating BiPAP poorly last night, but feel that sedating medications for the sake of tolerance is self-defeating.  Acute on Chronic systolic and diastolic CHF: EDW thought to be ~203lbs, currently above that with pulmonary edema on CXR. ECHO from this admission with EF 60-65%, incoordinate septal motion, moderate AS  - Was on bumex at home, but started lasix 60 mg BID for HF exacerbation, limited by hypotension.  - For concern of suppressing respiratory drive with supplemental oxygen, will change parameters for SpO2 goal 90-95%.  - Appreciate cardiology input, will cautiously resume lasix IV and monitor BP.    - Monitor daily weights and I/O's  Hypotension: Chronic, on prn midodrine.  - Schedule midodrine    Hypokalemia:  - Replace and recheck  Permanent atrial fibrillation:  Maintaining NSR - Continue xarelto  (CHA2DS2-VASc 4) and amiodarone  COPD: Mild wheezing. Hoping to avoid steroids w/CHF.  - Breathing treatments, BiPAP as above.  - Continue Spiriva  Hyperlipidemia - Continue statin   Obesity  - Body mass index is 35.3 kg/m  Penile and scrotal edema - pt seen by urology  - Foley cath replaced  Acute kidney injury - resolved with hydration, no holding IVF's  DVT prophylaxis: Xarelto Code Status: Partial (DNI) Family Communication: Daughter at bedside Disposition Plan: SNF pending improvement, still requires diuresis.   Consultants:   ID by phone  Cardiology  Procedures:  Stress test 10/29/16  Study Highlights     The left ventricular ejection fraction is normal (55-65%).  Nuclear stress EF: 64%.  There was no ST segment deviation noted during stress.  The study is normal.  This is a low risk study.  Normal resting and stress perfusion. No ischemia or infarction EF 64%    ECHO 05/19/17 Study Conclusions  - Left ventricle: The cavity size was normal. Wall thickness was increased in a pattern of mild LVH. Systolic function was normal. The estimated ejection fraction was in the range of 60% to 65%. Incoordinate septal motion. Doppler parameters are consistent with abnormal left ventricular relaxation (grade 1 diastolic dysfunction). The E/e&' ratio is between 8-15, suggesting indeterminate LV filling pressure. - Aortic valve: Calcified leaflets. Moderate stenosis. Mean gradient (S): 16 mm Hg. Peak gradient (S): 27 mm Hg. Valve area (VTI): 1.08 cm^2. Valve area (Vmax): 1.12 cm^2. - Mitral valve: Mildly thickened leaflets . There was trivial regurgitation. - Left atrium: The atrium was normal in size. - Right ventricle: The cavity size was mildly dilated. Systolic function was normal. - Right atrium: Moderately dilated. - Tricuspid valve: There was mild regurgitation. - Pulmonary arteries: PA peak pressure: 42 mm Hg (S). -  Inferior vena cava: The vessel was dilated. The respirophasic diameter changes were blunted (<50%), consistent with elevated central venous pressure. - Pericardium, extracardiac: A trivial pericardial effusion was identified posterior to the heart. Features were not consistent with tamponade physiology.  Impressions:  - Compared to a prior study in 08/2015, the LVEF is higher at 60-65% with incoordinate septal motion. The aortic stenosis is now moderate. There is mild RVE and moderate RAE with mild TR and RVSP of 42 mmHg, the IVC is dilated.  Antimicrobials:  Anti-infectives (From admission, onward)   Start     Dose/Rate Route Frequency Ordered Stop   05/21/17 0800  vancomycin (VANCOCIN) IVPB 750 mg/150 ml premix     750 mg 150 mL/hr over 60 Minutes Intravenous Every 24 hours 05/20/17 1321     05/20/17 1200  meropenem (MERREM) 1 g in sodium chloride 0.9 % 100 mL IVPB     1 g 200 mL/hr over 30 Minutes Intravenous Every 8 hours 05/19/17 1847     05/19/17 1215  piperacillin-tazobactam (ZOSYN) IVPB 3.375 g  Status:  Discontinued     3.375 g 12.5 mL/hr over 240 Minutes Intravenous Every 8 hours 05/19/17 1202 05/19/17 1836   05/16/17 0800  vancomycin (VANCOCIN) IVPB 1000 mg/200 mL premix  Status:  Discontinued     1,000 mg 200 mL/hr over 60 Minutes Intravenous Every 24 hours 05/16/17 0728 05/20/17 1321   05/15/17 0800  ertapenem (INVANZ) 1 g in sodium chloride 0.9 % 50 mL IVPB  Status:  Discontinued     1 g 100 mL/hr over 30 Minutes Intravenous Every 24 hours 05/14/17 2050 05/19/17 1150  05/14/17 1715  ceFEPIme (MAXIPIME) 2 g in dextrose 5 % 50 mL IVPB     2 g 100 mL/hr over 30 Minutes Intravenous  Once 05/14/17 1701 05/14/17 1941   05/14/17 1715  vancomycin (VANCOCIN) 1,500 mg in sodium chloride 0.9 % 500 mL IVPB  Status:  Discontinued     1,500 mg 250 mL/hr over 120 Minutes Intravenous  Once 05/14/17 1706 05/14/17 1721     Subjective: Daughter reports some  delirium, hallucinating objects behind the clock on the wall.  Did not tolerate BiPAP last night.   Objective: Vitals:   05/25/17 2205 05/25/17 2212 05/26/17 0522 05/26/17 1450  BP:  (!) 94/43 111/72 (!) 108/45  Pulse:  76 74 71  Resp:  (!) 26 20 18   Temp:  99.7 F (37.6 C) 98.4 F (36.9 C) (!) 97.5 F (36.4 C)  TempSrc:  Axillary Oral Oral  SpO2: 96% 94% 94% 98%  Weight:   93.7 kg (206 lb 9.1 oz)   Height:        Intake/Output Summary (Last 24 hours) at 05/26/2017 1638 Last data filed at 05/26/2017 1450 Gross per 24 hour  Intake 480 ml  Output 1100 ml  Net -620 ml   Filed Weights   05/24/17 0418 05/25/17 0500 05/26/17 0522  Weight: 92.6 kg (204 lb 2.3 oz) 95.8 kg (211 lb 3.2 oz) 93.7 kg (206 lb 9.1 oz)   Examination: Gen: Pleasantly confused elderly male in NAD HEENT: MMM, posterior oropharynx clear Pulm: Non-labored; bibasilar crackles with diffuse coarse sounds noted. CV: Regular rate, II/VI systolic murmur appreciated; distal pulses intact/symmetric. 1+ dependent edema. +JVD.  GI: + BS; soft, non-tender, non-distended Neuro: Alert, not oriented, stating he just got done shaving a dog. CN II-XII without deficits Skin: sacrum not examined this morning  CBC: Recent Labs  Lab 05/20/17 0340 05/21/17 0503 05/22/17 0345 05/24/17 0525 05/25/17 0356  WBC 6.1 7.2 7.8 7.9 8.0  HGB 9.3* 9.6* 9.3* 8.7* 8.9*  HCT 30.8* 32.0* 30.0* 28.0* 28.0*  MCV 97.2 95.8 94.0 94.0 92.4  PLT 235 248 271 317 333   Basic Metabolic Panel: Recent Labs  Lab 05/21/17 0503 05/22/17 0345 05/24/17 0525 05/25/17 0356 05/26/17 0429  NA 140 140 138 136 138  K 3.2* 3.5 3.4* 3.5 3.3*  CL 87* 84* 83* 84* 86*  CO2 46* 47* 46* 44* 43*  GLUCOSE 113* 100* 94 92 96  BUN 24* 26* 33* 34* 29*  CREATININE 0.88 0.89 0.86 0.76 0.82  CALCIUM 8.4* 8.6* 8.7* 8.7* 8.9  MG 1.9  --   --  2.5*  --    GFR: Estimated Creatinine Clearance: 69.1 mL/min (by C-G formula based on SCr of 0.82 mg/dL).   Time  spent: 25 min  Hazeline Junker, MD [facility] Pager [phone]   If 7PM-7AM, please contact night-coverage [email or web address] Password [facility] 05/26/2017, 4:38 PM

## 2017-05-26 NOTE — Progress Notes (Signed)
OT Cancellation Note  Patient Details Name: KASHAD HARNEY MRN: [id number] DOB: 1926-06-01   Cancelled Treatment:    Reason Eval/Treat Not Completed: OT screened, no needs identified, will sign off. Per chart, pt is currently +2 total assist for rolling in bed. He is from SNF and the plan is to return to SNF. Will defer OT evaluation to SNF.   Zannie Kehr Caymen Dubray 05/26/2017, 12:49 PM

## 2017-05-26 NOTE — Progress Notes (Signed)
Pharmacy Antibiotic Note  [name] is a 84 y.o. male admitted on 05/14/2017 with sepsis/osteomyelitis.  Pt came in on vanc and ertapemen that was started on 11/13 with instruction to finish on 06/07/17.  Pharmacy consulted to dose vancomycin.  Initially vanc trough was elevated and doses were held.  Vanc was resumed on 12/31.  Zosyn given x 1 dose 1/3 then meropenem started.   05/26/2017 Scr stable WBC WNL afebrile  Plan: Continue Meropenem 1g IV q8h Contniue vancomycin to 750 mg IV q24 for AUC 494.5, est Cmax 29.7, Cmin 13.5 - plan to check weekly troughs (next due 1/11) - plan was to continue abx until 1/22 but TR[facility] may discuss this with ID  Height: 5\' 8"  (172.7 cm) Weight: 206 lb 9.1 oz (93.7 kg) IBW/kg (Calculated) : 68.4  Temp (24hrs), Avg:98.8 F (37.1 C), Min:98.3 F (36.8 C), Max:99.7 F (37.6 C)  Recent Labs  Lab 05/20/17 0340 05/20/17 0845 05/20/17 1059 05/21/17 0503 05/22/17 0345 05/24/17 0525 05/25/17 0356 05/26/17 0429  WBC 6.1  --   --  7.2 7.8 7.9 8.0  --   CREATININE 0.91  --   --  0.88 0.89 0.86 0.76 0.82  VANCOTROUGH  --  18  --   --   --   --   --   --   VANCOPEAK  --   --  38  --   --   --   --   --     Estimated Creatinine Clearance: 69.1 mL/min (by C-G formula based on SCr of 0.82 mg/dL).    Allergies  Allergen Reactions  . Nsaids     Stomach pain  . Vicodin [Hydrocodone-Acetaminophen] Other (See Comments)    Unknown reaction many years ago  . Morphine And Related Nausea And Vomiting    Dizziness, light headed. "Opiates cause tightness in chest".   Antimicrobials this admission:  (12/12 PTA )  vancomycin >>  (12/12 PTA)  ertapenem >> 1/3 1/3 zosyn x 1 dose 1/3 meropenem >>  Dose adjustments this admission:  12/29: VT at 1720: 28 mcg/ml, continue to hold vanc (was on 1gm q12h) 12/30 0600 VR: 23 mcg/ml, continue to hold vanc  12/31: 0600 VR: 22mcg/ml 1/4 VT 18 at 0845 1/4 VP 38 at 1059 Ke 0.0343, T1/2 20.2, Cmax 39, Cmin 18, Vd  44.  Microbiology results:  12/30 BCx: ngF 12/30 UCx:  ngF 12/30 MRSA PCR: neg  12/5 Coccyx bone: MRSA + E.coli (S cefepime, imipenem, bactrim; R zosyn)  Thank you for allowing pharmacy to be a part of this patient's care.  Herby Abraham, Pharm.D. [phone] 05/26/2017 7:52 AM

## 2017-05-27 ENCOUNTER — Inpatient Hospital Stay (HOSPITAL_COMMUNITY): Payer: Medicare Other

## 2017-05-27 LAB — BASIC METABOLIC PANEL
ANION GAP: 6 (ref 5–15)
BUN: 29 mg/dL — AB (ref 6–20)
CALCIUM: 8.6 mg/dL — AB (ref 8.9–10.3)
CO2: 43 mmol/L — ABNORMAL HIGH (ref 22–32)
Chloride: 90 mmol/L — ABNORMAL LOW (ref 101–111)
Creatinine, Ser: 0.95 mg/dL (ref 0.61–1.24)
GFR calc Af Amer: >60 mL/min (ref >60)
GLUCOSE: 88 mg/dL (ref 65–99)
Potassium: 3.6 mmol/L (ref 3.5–5.1)
SODIUM: 139 mmol/L (ref 135–145)

## 2017-05-27 LAB — VANCOMYCIN, TROUGH: Vancomycin Tr: 13 ug/mL — ABNORMAL LOW (ref 15–20)

## 2017-05-27 LAB — MAGNESIUM: MAGNESIUM: 2.2 mg/dL (ref 1.7–2.4)

## 2017-05-27 LAB — GLUCOSE, CAPILLARY: Glucose-Capillary: 99 mg/dL (ref 65–99)

## 2017-05-27 NOTE — Progress Notes (Signed)
LCSW following for return to facility.  Patient will return to [facility] at dc.  Patient has insurance auth through 1/12.   LCSW updated facility and family.   Beulah Gandy Jetmore Long CSW [phone]

## 2017-05-27 NOTE — Care Management Important Message (Signed)
Important Message  Patient Details  Name: LONG INTRIAGO MRN: [id number] Date of Birth: 1926-08-29   Medicare Important Message Given:  Yes    Caren Macadam 05/27/2017, 10:33 AMImportant Message  Patient Details  Name: MERLYN BATTAGLIA MRN: [id number] Date of Birth: 27-Apr-1927   Medicare Important Message Given:  Yes    Caren Macadam 05/27/2017, 10:32 AM

## 2017-05-27 NOTE — Progress Notes (Signed)
Pharmacy Antibiotic Note  [name] is a 84 y.o. male admitted on 05/14/2017 with sepsis/osteomyelitis.  Pt came in on vanc and ertapemen that was started on 11/13 with instruction to finish on 06/07/17.  Pharmacy consulted to dose vancomycin.  Initially vanc trough was elevated and doses were held.  Vanc was resumed on 12/31.  Zosyn given x 1 dose 1/3 then meropenem started.   05/27/2017 Scr stable WBC WNL Afebrile Vancomycin trough 13 mcg/ml - as predicted for AUC 494 on vancomycin 750 mg IV q24  Plan: Continue Meropenem 1g IV q8h Contniue vancomycin to 750 mg IV q24 for AUC 494.5, est Cmax 29.7, Cmin 13.5 - plan to check weekly troughs (next due 1/18) - plan to continue abx until 1/22  Height: 5\' 8"  (172.7 cm) Weight: 202 lb 9.6 oz (91.9 kg) IBW/kg (Calculated) : 68.4  Temp (24hrs), Avg:98.1 F (36.7 C), Min:97.5 F (36.4 C), Max:98.5 F (36.9 C)  Recent Labs  Lab 05/20/17 0845 05/20/17 1059  05/21/17 0503 05/22/17 0345 05/24/17 0525 05/25/17 0356 05/26/17 0429 05/27/17 0348 05/27/17 0737  WBC  --   --   --  7.2 7.8 7.9 8.0  --   --   --   CREATININE  --   --    < > 0.88 0.89 0.86 0.76 0.82 0.95  --   VANCOTROUGH 18  --   --   --   --   --   --   --   --  13*  VANCOPEAK  --  38  --   --   --   --   --   --   --   --    < > = values in this interval not displayed.    Estimated Creatinine Clearance: 59.1 mL/min (by C-G formula based on SCr of 0.95 mg/dL).    Allergies  Allergen Reactions  . Nsaids     Stomach pain  . Vicodin [Hydrocodone-Acetaminophen] Other (See Comments)    Unknown reaction many years ago  . Morphine And Related Nausea And Vomiting    Dizziness, light headed. "Opiates cause tightness in chest".   Antimicrobials this admission:  (12/12 PTA )  vancomycin >>  (12/12 PTA)  ertapenem >> 1/3 1/3 zosyn x 1 dose 1/3 meropenem >>  Dose adjustments this admission:  12/29: VT at 1720: 28 mcg/ml, continue to hold vanc (was on 1gm q12h) 12/30 0600  VR: 23 mcg/ml, continue to hold vanc  12/31: 0600 VR: 104mcg/ml 1/4 VT 18 at 0845 1/4 VP 38 at 1059 Ke 0.0343, T1/2 20.2, Cmax 39, Cmin 18, Vd 44. 1/11 VT = 13 at 0737  Microbiology results:  12/30 BCx: ngF 12/30 UCx:  ngF 12/30 MRSA PCR: neg  12/5 Coccyx bone: MRSA + E.coli (S cefepime, imipenem, bactrim; R zosyn)  Thank you for allowing pharmacy to be a part of this patient's care.  Herby Abraham, Pharm.D. [phone] 05/27/2017 8:28 AM

## 2017-05-27 NOTE — Progress Notes (Signed)
Nutrition Follow-up  DOCUMENTATION CODES:   Obesity unspecified  INTERVENTION:    Ensure Enlive po BID, each supplement provides 350 kcal and 20 grams of protein  Provide MVI daily  NUTRITION DIAGNOSIS:   Increased nutrient needs related to wound healing as evidenced by estimated needs.  Ongoing  GOAL:   Patient will meet greater than or equal to 90% of their needs  Meeting  MONITOR:   PO intake, Supplement acceptance, Weight trends, Labs, I & O's, Skin  REASON FOR ASSESSMENT:   Consult Wound healing  ASSESSMENT:   84 y.o. male with medical history of persistent atrial fibrillation, systolic and diastolic CHF, COPD, coccygeal osteomyelitis, hyperlipidemia presenting with fluctuating mental status since May 11, 2017.  The patient was recently discharged from the hospital after a stay from 02/07/17 through 02/21/17 when he was treated for sepsis secondary to sacral osteomyelitis.  A PICC line was placed, and the patient was discharged with antibiotics to finish on 03/22/2017.    Appetite has improved significantly. Pt consuming 75-100% of his last eight meals. Continues to drink Ensure TID. Continues diuresing on lasix. Weight noted to be trending down 5 lb since 1/7. Will continue to provide supplementation and encourage PO intake. Plan for d/c to SNF once medically appropriate.   Medications reviewed and include: colace, ferrous sulfate, 40 mg lasix daily, MVI with minerals, IV abx Labs reviewed: Cl 90 (H) CO2 43 (H)   Diet Order:  Diet Heart Room service appropriate? Yes; Fluid consistency: Thin  EDUCATION NEEDS:   No education needs have been identified at this time  Skin:  Skin Assessment: Skin Integrity Issues: Skin Integrity Issues:: Stage IV Stage IV: sacrum  Last BM:  05/25/17  Height:   Ht Readings from Last 1 Encounters:  05/14/17 5\' 8"  (1.727 m)    Weight:   Wt Readings from Last 1 Encounters:  05/27/17 202 lb 9.6 oz (91.9 kg)    Ideal Body  Weight:  70 kg  BMI:  Body mass index is 30.81 kg/m.  Estimated Nutritional Needs:   Kcal:  2200-2400  Protein:  105-115g  Fluid:  2.2L/day    Vanessa Kick RD, LDN Clinical Nutrition Pager # [phone]

## 2017-05-27 NOTE — Progress Notes (Signed)
Progress Note  Patient Name: [name] Date of Encounter: 05/27/2017  Primary Cardiologist: Chrystie Nose, MD   Subjective   Denies CP or dyspnea  Inpatient Medications    Scheduled Meds: . acetaminophen  1,000 mg Oral Q8H  . albuterol  2.5 mg Nebulization BID  . amiodarone  200 mg Oral Daily  . Chlorhexidine Gluconate Cloth  6 each Topical Daily  . docusate sodium  100 mg Oral BID  . feeding supplement (ENSURE ENLIVE)  237 mL Oral TID BM  . ferrous sulfate  325 mg Oral TID WC  . furosemide  40 mg Intravenous Daily  . loratadine  10 mg Oral Daily  . midodrine  10 mg Oral TID WC  . multivitamin with minerals  1 tablet Oral Daily  . polyethylene glycol  17 g Oral BID  . rivaroxaban  20 mg Oral Q2000  . tamsulosin  0.4 mg Oral Daily  . tiotropium  18 mcg Inhalation Daily   Continuous Infusions: . sodium chloride 10 mL/hr at 05/16/17 1437  . meropenem (MERREM) IV 1 g (05/27/17 0509)  . vancomycin Stopped (05/27/17 0919)   PRN Meds: albuterol, ondansetron **OR** ondansetron (ZOFRAN) IV, sodium chloride flush, traMADol   Vital Signs    Vitals:   05/26/17 1450 05/26/17 1953 05/27/17 0454 05/27/17 0500  BP: (!) 108/45 (!) 99/42 105/73   Pulse: 71 73 70   Resp: 18 18 (!) 22   Temp: (!) 97.5 F (36.4 C) 98.5 F (36.9 C) 98.4 F (36.9 C)   TempSrc: Oral Oral Oral   SpO2: 98% 97% 96%   Weight:    202 lb 9.6 oz (91.9 kg)  Height:        Intake/Output Summary (Last 24 hours) at 05/27/2017 1023 Last data filed at 05/27/2017 0838 Gross per 24 hour  Intake 490 ml  Output 950 ml  Net -460 ml   Filed Weights   05/25/17 0500 05/26/17 0522 05/27/17 0500  Weight: 211 lb 3.2 oz (95.8 kg) 206 lb 9.1 oz (93.7 kg) 202 lb 9.6 oz (91.9 kg)    Telemetry    Sinus with PACs and PVCs - Personally Reviewed   Physical Exam   GEN: Elderly No acute distress.   Neck: supple Cardiac: irregular Respiratory: Clear to auscultation bilaterally. GI: Soft, mildly  distended MS: No edema Neuro:  Nonfocal  Psych: Normal affect   Labs    Chemistry Recent Labs  Lab 05/25/17 0356 05/26/17 0429 05/27/17 0348  NA 136 138 139  K 3.5 3.3* 3.6  CL 84* 86* 90*  CO2 44* 43* 43*  GLUCOSE 92 96 88  BUN 34* 29* 29*  CREATININE 0.76 0.82 0.95  CALCIUM 8.7* 8.9 8.6*  GFRNONAA >60 >60 >60  GFRAA >60 >60 >60  ANIONGAP 8 9 6      Hematology Recent Labs  Lab 05/22/17 0345 05/24/17 0525 05/25/17 0356  WBC 7.8 7.9 8.0  RBC 3.19* 2.98* 3.03*  HGB 9.3* 8.7* 8.9*  HCT 30.0* 28.0* 28.0*  MCV 94.0 94.0 92.4  MCH 29.2 29.2 29.4  MCHC 31.0 31.1 31.8  RDW 15.1 15.4 15.4  PLT 271 317 333    Radiology    Dg Chest Port 1 View  Result Date: 05/27/2017 CLINICAL DATA:  Cough and left-sided chest pain EXAM: PORTABLE CHEST 1 VIEW COMPARISON:  05/24/17 FINDINGS: Cardiac shadow is enlarged but stable. Aortic calcifications are again seen. Right-sided PICC line is again noted in the mid superior vena cava. Vascular  congestion is again seen with small bilateral pleural effusions stable from the prior exam. No new focal infiltrate is noted. IMPRESSION: Changes of vascular congestion and bilateral effusions stable from the prior study. Electronically Signed   By: Alcide Clever M.D.   On: 05/27/2017 09:28    Patient Profile     84 year old male with past medical history of paroxysmal atrial fibrillation on chronic amiodarone, mild to moderate aortic stenosis, diastolic congestive heart failure, chronic hypotension, sacral osteomyelitis for evaluation of acute on chronic diastolic congestive heart failure.  Most recent echocardiogram on January 13 showed normal LV function, grade 1 diastolic dysfunction, mild to moderate aortic stenosis with mean gradient 16 mmHg, right atrial and right ventricular enlargement.  Patient has been treated intermittently for sacral osteomyelitis.  He was admitted on December 29 with altered mental status and low-grade fever.  He developed  increasing volume and was placed on IV Lasix.  His blood pressure has been borderline and cardiology asked to evaluate.     Assessment & Plan    1 acute on chronic diastolic congestive heart failure-continue Lasix 40 mg IV daily and follow renal function.  I/O -460. Can likely transition back to home dose of bumex in AM.   2 hypotension-will continue midodrine; BP stable.  3 mild to moderate aortic stenosis on most recent echocardiogram  4 paroxysmal atrial fibrillation-patient remains in sinus rhythm on telemetry.  Continue amiodarone and Xarelto.  5 osteomyelitis-continue antibiotics per primary care.  We will see again Monday; please call over weekend with questions.  For questions or updates, please contact [facility] Please consult [email or web address] for contact info under Cardiology/STEMI.      Signed, Olga Millers, MD  05/27/2017, 10:23 AM

## 2017-05-27 NOTE — Progress Notes (Signed)
PROGRESS NOTE    [name]  JME:[id number] DOB: 14-May-1927 DOA: 05/14/2017 PCP: [name], MD   Brief Narrative:  84 y.o.malewith medical history ofpersistent atrial fibrillation, systolic and diastolic CHF, COPD, coccygeal osteomyelitis, hyperlipidemia presenting with fluctuating mental status since May 11, 2017. The patient was recently discharged from the hospital after a stay from 02/07/17 through 02/21/17 when he was treated for sepsis secondary to sacral osteomyelitis. A PICC line was placed, and the patient was discharged with antibiotics to finish on 03/22/2017. His PICC line was subsequently removed after he finished his antibiotics. He continued to receive wound care. During the wound care visit on 04/20/2017, the patient was noted to have necrotic bone in his coccyx. Bone biopsy and cultures were sent and were consistent with osteomyelitis. Bone cultures grew group B streptococcus, diphtheroids, E. coli, and MRSA a PICC line was placed on 03/29/2017, the patient was started on vancomycin and ertapenem With instructions to finish on 06/07/2017.The patient was doing fairly well up until the last 3 days when his family noted that he has been having intermittent episodes of somnolence and difficulty to arouse. In between these episodes, the patient will be fully lucid and able to speak with clarity. On the morning of 05/14/2017, the patient was difficult to arouse, and was somnolent after he was awoken. As result, the patient was transferred to the emergency department for further evaluation.  Assessment & Plan:  Active Problems:   Hypotension   Osteomyelitis of coccyx (HCC)   Acute encephalopathy   Permanent atrial fibrillation (HCC)   Chronic respiratory failure with hypoxia (HCC)   Chronic combined systolic and diastolic CHF (congestive heart failure) (HCC)  Acute toxic metabolic encephalopathy: Possibly due to hypercarbia in setting of chronic hypoxic  respiratory failure and acute worsening w/volume overload. Definitely also an element of delirium given waxing/waning course. Also possibly just a heavy sleeper. Discussed with daughter that I doubt abx played a role, consistent with ID's opinion. Initially concern for UTI (but no growth) and concern for seizure (due to tramadol or ertapenem). EEG was done and was notable for mild to moderate global slowing indicating global encephalopathy, typically seen in toxic metabolic process.  - Continue to use BiPAP and optimize volume status, continue abx as below for coccygeal osteomyelitis.  - Delirium precautions.  Coccygeal osteomyelitis: Bone biopsy and culture 04/20/2017 grew GBS, diphtheroids, E. coli, and MRSA (E. coli sensitive to cefepime, imipenem, bactrim) - PICC line was placed on 03/29/2017, the patient was started on vancomycin and ertapenem with instructions to finish on 06/07/2017. Transitioned to meropenem while inpatient (formulary), will switch to ertapenem at discharge.  Chronic respiratory failure with hypoxia/hypercarbia: Intermittently on oxygen 1-2 L at SNF. - Continue BiPAP qHS, strongly suspect OSA due to daughter's reports of observed startling following apnea. Pt tolerating BiPAP poorly last night, but feel that sedating medications for the sake of tolerance is self-defeating.  Acute on chronic diastolic CHF: EDW thought to be ~203lbs. ECHO from this admission with EF 60-65% (formerly had systolic CHF), incoordinate septal motion, moderate AS  - Given IV lasix today, will monitor strict I/O, daily weights (now at EDW) and SCr in AM. If stable, hopeful for return to home bumex - For concern of suppressing respiratory drive with supplemental oxygen, will change parameters for SpO2 goal 90-95%.  - Appreciate cardiology input  - Monitor daily weights and I/O's  Hypotension: Chronic, on prn midodrine.  - Scheduled midodrine TID.    Hypokalemia:  - Replace  and recheck  Permanent  atrial fibrillation: Maintaining NSR - Continue xarelto (CHA2DS2-VASc 4) and amiodarone  COPD: Wheezing improving. Hoping to avoid steroids w/CHF.  - Breathing treatments, BiPAP as above.  - Continue spiriva  Hyperlipidemia - Continue statin   Obesity  - Body mass index is 35.3 kg/m  Penile and scrotal edema - pt seen by urology  - Foley cath replaced  Acute kidney injury - resolved with hydration, no holding IVF's  DVT prophylaxis: Xarelto Code Status: Partial (DNI) Family Communication: Daughter at bedside Disposition Plan: SNF pending improvement, hopefully 1/12.  Consultants:   ID by phone  Cardiology  Procedures:  Stress test 10/29/16  Study Highlights     The left ventricular ejection fraction is normal (55-65%).  Nuclear stress EF: 64%.  There was no ST segment deviation noted during stress.  The study is normal.  This is a low risk study.  Normal resting and stress perfusion. No ischemia or infarction EF 64%    ECHO 05/19/17 Study Conclusions  - Left ventricle: The cavity size was normal. Wall thickness was increased in a pattern of mild LVH. Systolic function was normal. The estimated ejection fraction was in the range of 60% to 65%. Incoordinate septal motion. Doppler parameters are consistent with abnormal left ventricular relaxation (grade 1 diastolic dysfunction). The E/e&' ratio is between 8-15, suggesting indeterminate LV filling pressure. - Aortic valve: Calcified leaflets. Moderate stenosis. Mean gradient (S): 16 mm Hg. Peak gradient (S): 27 mm Hg. Valve area (VTI): 1.08 cm^2. Valve area (Vmax): 1.12 cm^2. - Mitral valve: Mildly thickened leaflets . There was trivial regurgitation. - Left atrium: The atrium was normal in size. - Right ventricle: The cavity size was mildly dilated. Systolic function was normal. - Right atrium: Moderately dilated. - Tricuspid valve: There was mild regurgitation. -  Pulmonary arteries: PA peak pressure: 42 mm Hg (S). - Inferior vena cava: The vessel was dilated. The respirophasic diameter changes were blunted (<50%), consistent with elevated central venous pressure. - Pericardium, extracardiac: A trivial pericardial effusion was identified posterior to the heart. Features were not consistent with tamponade physiology.  Impressions:  - Compared to a prior study in 08/2015, the LVEF is higher at 60-65% with incoordinate septal motion. The aortic stenosis is now moderate. There is mild RVE and moderate RAE with mild TR and RVSP of 42 mmHg, the IVC is dilated.  Antimicrobials:  Anti-infectives (From admission, onward)   Start     Dose/Rate Route Frequency Ordered Stop   05/21/17 0800  vancomycin (VANCOCIN) IVPB 750 mg/150 ml premix     750 mg 150 mL/hr over 60 Minutes Intravenous Every 24 hours 05/20/17 1321     05/20/17 1200  meropenem (MERREM) 1 g in sodium chloride 0.9 % 100 mL IVPB     1 g 200 mL/hr over 30 Minutes Intravenous Every 8 hours 05/19/17 1847     05/19/17 1215  piperacillin-tazobactam (ZOSYN) IVPB 3.375 g  Status:  Discontinued     3.375 g 12.5 mL/hr over 240 Minutes Intravenous Every 8 hours 05/19/17 1202 05/19/17 1836   05/16/17 0800  vancomycin (VANCOCIN) IVPB 1000 mg/200 mL premix  Status:  Discontinued     1,000 mg 200 mL/hr over 60 Minutes Intravenous Every 24 hours 05/16/17 0728 05/20/17 1321   05/15/17 0800  ertapenem (INVANZ) 1 g in sodium chloride 0.9 % 50 mL IVPB  Status:  Discontinued     1 g 100 mL/hr over 30 Minutes Intravenous Every 24 hours 05/14/17 2050  05/19/17 1150   05/14/17 1715  ceFEPIme (MAXIPIME) 2 g in dextrose 5 % 50 mL IVPB     2 g 100 mL/hr over 30 Minutes Intravenous  Once 05/14/17 1701 05/14/17 1941   05/14/17 1715  vancomycin (VANCOCIN) 1,500 mg in sodium chloride 0.9 % 500 mL IVPB  Status:  Discontinued     1,500 mg 250 mL/hr over 120 Minutes Intravenous  Once 05/14/17 1706  05/14/17 1721     Subjective: Mental status is stable per daughter, did not sleep well last night so tired this morning. No hallucinations. Did not tolerate BiPAP again. CXR this AM shows improvement in pulmonary edema with stable small effusions on my personal review.  Objective: Vitals:   05/26/17 1450 05/26/17 1953 05/27/17 0454 05/27/17 0500  BP: (!) 108/45 (!) 99/42 105/73   Pulse: 71 73 70   Resp: 18 18 (!) 22   Temp: (!) 97.5 F (36.4 C) 98.5 F (36.9 C) 98.4 F (36.9 C)   TempSrc: Oral Oral Oral   SpO2: 98% 97% 96%   Weight:    91.9 kg (202 lb 9.6 oz)  Height:        Intake/Output Summary (Last 24 hours) at 05/27/2017 1415 Last data filed at 05/27/2017 1100 Gross per 24 hour  Intake 730 ml  Output 950 ml  Net -220 ml   Filed Weights   05/25/17 0500 05/26/17 0522 05/27/17 0500  Weight: 95.8 kg (211 lb 3.2 oz) 93.7 kg (206 lb 9.1 oz) 91.9 kg (202 lb 9.6 oz)   Examination: Gen: Sleeping, difficult to arouse elderly male in NAD HEENT: MMM, posterior oropharynx clear Pulm: Non-labored with supplemental oxygen, crackles improved. No wheezes.  CV: Irreg, II/VI systolic murmur; distal pulses intact/symmetric. No dependent edema or JVD.  GI: + BS; soft, non-tender, non-distended Neuro: rousable, not oriented. No focal deficits. Skin: sacrum not examined this morning  CBC: Recent Labs  Lab 05/21/17 0503 05/22/17 0345 05/24/17 0525 05/25/17 0356  WBC 7.2 7.8 7.9 8.0  HGB 9.6* 9.3* 8.7* 8.9*  HCT 32.0* 30.0* 28.0* 28.0*  MCV 95.8 94.0 94.0 92.4  PLT 248 271 317 333   Basic Metabolic Panel: Recent Labs  Lab 05/21/17 0503 05/22/17 0345 05/24/17 0525 05/25/17 0356 05/26/17 0429 05/27/17 0348  NA 140 140 138 136 138 139  K 3.2* 3.5 3.4* 3.5 3.3* 3.6  CL 87* 84* 83* 84* 86* 90*  CO2 46* 47* 46* 44* 43* 43*  GLUCOSE 113* 100* 94 92 96 88  BUN 24* 26* 33* 34* 29* 29*  CREATININE 0.88 0.89 0.86 0.76 0.82 0.95  CALCIUM 8.4* 8.6* 8.7* 8.7* 8.9 8.6*  MG 1.9  --    --  2.5*  --  2.2   GFR: Estimated Creatinine Clearance: 59.1 mL/min (by C-G formula based on SCr of 0.95 mg/dL).   Time spent: 25 min  Hazeline Junker, MD [facility] Pager [phone]   If 7PM-7AM, please contact night-coverage [email or web address] Password [email or web address] 05/27/2017, 2:15 PM

## 2017-05-27 NOTE — Progress Notes (Signed)
Physical Therapy Treatment Patient Details Name: [name] MRN: [id number] DOB: 27-Oct-1926 Today's Date: 05/27/2017    History of Present Illness 84 y.o. male with medical history of persistent atrial fibrillation, systolic and diastolic CHF, COPD, coccygeal osteomyelitis, hyperlipidemia presenting with fluctuating mental status since May 11, 2017.  The patient was recently discharged from the hospital after a stay from 02/07/17 through 02/21/17 when he was treated for sepsis secondary to sacral osteomyelitis. Pt admitted with somnolence, dx of acute encephalopathy.    PT Comments    Pt assisted with rolling in bed, also given cues to self assist as able.  Pt fatigues quickly.  Continue to recommend SNF upon d/c.  Follow Up Recommendations  SNF     Equipment Recommendations  None recommended by PT    Recommendations for Other Services       Precautions / Restrictions Precautions Precautions: Fall    Mobility  Bed Mobility Overal bed mobility: Needs Assistance Bed Mobility: Rolling Rolling: +2 for physical assistance;Max assist         General bed mobility comments: pt rolled x3 to each side, changed linen and then pt had mucous, watery BM so changed linen again, pt assisting with upper body by grabbing rails, assist required for LEs and hips, had pt hold L side with pillow if painful; RN also in room and assisted, changed sacral dressing  Transfers                    Ambulation/Gait                 Stairs            Wheelchair Mobility    Modified Rankin (Stroke Patients Only)       Balance                                            Cognition Arousal/Alertness: Awake/alert Behavior During Therapy: WFL for tasks assessed/performed Overall Cognitive Status: Within Functional Limits for tasks assessed                                        Exercises General Exercises - Lower Extremity Ankle  Circles/Pumps: AROM;Both;10 reps;Supine Heel Slides: AAROM;Both;10 reps;Supine    General Comments        Pertinent Vitals/Pain Pain Assessment: Faces Faces Pain Scale: Hurts even more Pain Location: L lower chest (pt reports a broken rib) and coccyx wound Pain Descriptors / Indicators: Sore;Sharp Pain Intervention(s): Limited activity within patient's tolerance;Repositioned;Monitored during session    Home Living                      Prior Function            PT Goals (current goals can now be found in the care plan section) Progress towards PT goals: Progressing toward goals    Frequency    Min 2X/week      PT Plan Current plan remains appropriate    Co-evaluation              AM-PAC PT "6 Clicks" Daily Activity  Outcome Measure  Difficulty turning over in bed (including adjusting bedclothes, sheets and blankets)?: Unable Difficulty moving from lying on back to sitting on the side of  the bed? : Unable Difficulty sitting down on and standing up from a chair with arms (e.g., wheelchair, bedside commode, etc,.)?: Unable Help needed moving to and from a bed to chair (including a wheelchair)?: Total Help needed walking in hospital room?: Total Help needed climbing 3-5 steps with a railing? : Total 6 Click Score: 6    End of Session   Activity Tolerance: Patient limited by fatigue;Patient limited by pain Patient left: in bed;with call bell/phone within reach;with bed alarm set;with nursing/sitter in room   PT Visit Diagnosis: Muscle weakness (generalized) (M62.81);Difficulty in walking, not elsewhere classified (R26.2)     Time: 9604-5409 PT Time Calculation (min) (ACUTE ONLY): 35 min  Charges:  $Therapeutic Activity: 23-37 mins                    G Codes:       Zenovia Jarred, PT, DPT 05/27/2017 Pager: [phone]  Maida Sale E 05/27/2017, 5:02 PM

## 2017-05-28 ENCOUNTER — Inpatient Hospital Stay (HOSPITAL_COMMUNITY): Payer: Medicare Other

## 2017-05-28 LAB — BASIC METABOLIC PANEL
Anion gap: 6 (ref 5–15)
BUN: 25 mg/dL — AB (ref 6–20)
CALCIUM: 8.3 mg/dL — AB (ref 8.9–10.3)
CHLORIDE: 91 mmol/L — AB (ref 101–111)
CO2: 39 mmol/L — ABNORMAL HIGH (ref 22–32)
CREATININE: 0.79 mg/dL (ref 0.61–1.24)
Glucose, Bld: 91 mg/dL (ref 65–99)
Potassium: 3.2 mmol/L — ABNORMAL LOW (ref 3.5–5.1)
SODIUM: 136 mmol/L (ref 135–145)

## 2017-05-28 MED ORDER — POTASSIUM CHLORIDE 20 MEQ PO PACK: 40.0000 meq | PACK | Freq: Two times a day (BID) | ORAL | Status: DC

## 2017-05-28 MED ORDER — POTASSIUM CHLORIDE CRYS ER 20 MEQ PO TBCR
40.0000 meq | EXTENDED_RELEASE_TABLET | Freq: Two times a day (BID) | ORAL | Status: DC
  Administered 2017-05-28: 40 meq via ORAL
  Filled 2017-05-28: qty 2

## 2017-05-28 MED ORDER — MIDODRINE HCL 10 MG PO TABS: 10.0000 mg | ORAL_TABLET | Freq: Three times a day (TID) | ORAL | 2 refills | Status: AC

## 2017-05-28 MED ORDER — TRAMADOL HCL 50 MG PO TABS: 50.0000 mg | ORAL_TABLET | Freq: Two times a day (BID) | ORAL | 0 refills | Status: AC | PRN

## 2017-05-28 MED ORDER — HEPARIN SOD (PORK) LOCK FLUSH 100 UNIT/ML IV SOLN
250.0000 [IU] | INTRAVENOUS | Status: AC | PRN
  Administered 2017-05-28: 250 [IU]

## 2017-05-28 NOTE — Progress Notes (Signed)
Report called to [place] at [place].  All questions answered.

## 2017-05-28 NOTE — Progress Notes (Signed)
LCSW following for discharge back to facility.  Patient returning to [facility]. Spoke with Selena Batten regarding return and she is agreeable. All paperwork completed and sent to facility. Call placed to Daughter: Frances Nickels [phone]. Daughter in room and reports that they are waiting on one consulting doc for discharge.  Will verify DC is clear and will complete transport by EMS.   Deretha Emory, MSW Clinical Social Work: Optician, dispensing

## 2017-05-28 NOTE — Discharge Summary (Addendum)
Physician Discharge Summary  [name] ZOX:[id number] DOB: [birth year] DOA: 05/14/2017  PCP: [name], MD  Admit date: 05/14/2017 Discharge date: 05/28/2017  Admitted From: SNF Disposition: SNF   Recommendations for Outpatient Follow-up:  1. Follow up with urology 2. Follow up with cardiology 3. Follow up with ID 4. For sacral wound: Change saline moistened gauze dressing BID and PRN incontinence. 5. Recommend repeating abd XR this coming week to evaluate for stability with dilated colon suspected due to bedbound status. Having flatus and BMs.   Home Health: N/A Equipment/Devices: Continue PICC placed PTA Discharge Condition: Stable CODE STATUS: Partial (all resuscitative efforts except for intubation) In the event of cardiac or respiratory ARREST: Initiate Code Blue, Call Rapid Response Yes  In the event of cardiac or respiratory ARREST: Perform CPR Yes  In the event of cardiac or respiratory ARREST: Perform Intubation/Mechanical Ventilation No  In the event of cardiac or respiratory ARREST: Use NIPPV/BiPAp only if indicated Yes  In the event of cardiac or respiratory ARREST: Administer ACLS medications if indicated Yes  In the event of cardiac or respiratory ARREST: Perform Defibrillation or Cardioversion if indicated Yes  Diet recommendation: Heart healthy  Brief/Interim Summary: [name] is an 84 y.o.malewith medical history ofpersistent atrial fibrillation, systolic and diastolic CHF, COPD, coccygeal osteomyelitis, hyperlipidemia presenting with fluctuating mental status since May 11, 2017. The patient was recently discharged from the hospital after a stay from 02/07/17 through 02/21/17 when he was treated for sepsis secondary to sacral osteomyelitis. A PICC line was placed, and the patient was discharged with antibiotics to finish on 03/22/2017. His PICC line was subsequently removed after he finished his antibiotics. He continued to receive wound  care. During the wound care visit on 04/20/2017, the patient was noted to have necrotic bone in his coccyx. Bone biopsy and cultures were sent and were consistent with osteomyelitis.Bone cultures grew group B streptococcus, diphtheroids, E. coli, and MRSA a PICC line was placed on 03/29/2017, the patient was started on vancomycin and ertapenem With instructions to finish on 06/07/2017.The patient was doing fairly well up until the last 3 days when his family noted that he has been having intermittent episodes of somnolence and difficulty to arouse. In between these episodes, the patient will be fully lucid and able to speak with clarity. On the morning of 05/14/2017, the patient was difficult to arouse, and was somnolent after he was awoken. As result, the patient was transferred to the emergency department for further evaluation.  Discharge Diagnoses:  Active Problems:   Hypotension   Osteomyelitis of coccyx (HCC)   Acute encephalopathy   Permanent atrial fibrillation (HCC)   Chronic respiratory failure with hypoxia (HCC)   Chronic combined systolic and diastolic CHF (congestive heart failure) (HCC)  Acute toxic metabolic encephalopathy: Possibly due to hypercarbia in setting of chronic hypoxic respiratory failure and acute worsening w/volume overload. Definitely also an element of delirium given waxing/waning course. Also noted to be a heavy sleeper. Discussed with daughter that I doubt abx played a role, consistent with ID's opinion. Initially concern for UTI (but no growth on culture) and concern for seizure (due to tramadol or ertapenem). EEG was done and was notable for mild to moderate global slowing indicating global encephalopathy, typically seen in toxic metabolic process.  - Continue to use BiPAP qHS and optimize volume status, continue abx as below for coccygeal osteomyelitis. - Delirium precautions.  Coccygeal osteomyelitis: Bone biopsy and culture 04/20/2017 grew GBS,  diphtheroids, E. coli, and  MRSA (E. coli sensitive to cefepime, imipenem, bactrim) -PICC line was placed on 03/29/2017, the patient was started on vancomycin and ertapenemwith instructions to finish on 06/07/2017. Transitioned to meropenem while inpatient (formulary), will switch to ertapenem at discharge. NO CHANGES TO PLANNED COURSE   Chronic respiratory failure with hypoxia/hypercarbia: Intermittently on oxygen 1-2 L at SNF. - Continue BiPAP qHS, strongly suspect OSA due to daughter's reports of observed startling following apnea. Pt tolerating BiPAP poorly, but feel that sedating medications for the sake of tolerance is self-defeating.  Acute onchronic diastolic CHF: EDW thought to be ~203lbs. ECHO from this admission with EF 60-65% (formerly had systolic CHF), incoordinate septal motion, moderate AS  - Given IV lasix today, will plan to start home bumex 1/13 (now at EDW at 202lbs) and monitor SCr this week.  - For concern of suppressing respiratory drive with supplemental oxygen, recommend parameters for SpO2 goal 90-95%.   Hypotension: Chronic, on prn midodrine.  - Scheduled midodrine TID.    Hypokalemia:  - Replace and recheck  Permanent atrial fibrillation: Maintaining NSR - Continue xarelto (CHA2DS2-VASc 4) and amiodarone  COPD: Wheezing improving. Hoping to avoid steroids w/CHF.  - Breathing treatments, BiPAP as above.  - Continue spiriva  Hyperlipidemia - Continue statin  Obesity  - Body mass index is 35.3 kg/m  Penile and scrotal edema - pt seen by urology  - Foley cath replaced, follow up with Dr. Sande Brothers in 2-3 weeks.  Acute kidney injury - Resolved with hydration   Colon dilation: Noted on abd XR, though pt does not clinically have any symptoms of obstruction. Case discussed with Dr. Levora Angel of [facility] (Dr. Marca Ancona is pt's GI).  - Monitor abdominal exams, and recheck XR this week.  - Mobilize as much as possible.   Discharge  Instructions Discharge Instructions    Diet - low sodium heart healthy   Complete by:  As directed    Increase activity slowly   Complete by:  As directed      Allergies as of 05/28/2017      Reactions   Nsaids    Stomach pain   Vicodin [hydrocodone-acetaminophen] Other (See Comments)   Unknown reaction many years ago   Morphine And Related Nausea And Vomiting   Dizziness, light headed. "Opiates cause tightness in chest".      Medication List    TAKE these medications   acetaminophen 500 MG tablet Commonly known as:  TYLENOL Take 2 tablets (1,000 mg total) by mouth every 8 (eight) hours.   albuterol 108 (90 Base) MCG/ACT inhaler Commonly known as:  PROVENTIL HFA;VENTOLIN HFA Inhale 2 puffs into the lungs. Every 6 hours as needed for wheezing or shortness of breath   amiodarone 200 MG tablet Commonly known as:  PACERONE TAKE ONE TABLET BY MOUTH ONCE DAILY   bumetanide 1 MG tablet Commonly known as:  BUMEX Take 1 tablet (1 mg total) by mouth 2 (two) times daily.   cetirizine 10 MG tablet Commonly known as:  ZYRTEC Take 10 mg by mouth daily as needed for allergies.   DSS 100 MG Caps Take 100 mg by mouth 2 (two) times daily.   ertapenem 1 g in sodium chloride 0.9 % 50 mL Inject 1 g into the vein.   famotidine 20 MG tablet Commonly known as:  PEPCID Take 1 tablet (20 mg total) by mouth 2 (two) times daily.   ferrous sulfate 325 (65 FE) MG tablet Commonly known as:  FERROUSUL Take 1 tablet (325 mg  total) by mouth 3 (three) times daily with meals.   gabapentin 400 MG capsule Commonly known as:  NEURONTIN TAKE 1 CAPSULE BY MOUTH THREE TIMES DAILY   ICAPS AREDS FORMULA PO Take 1 capsule by mouth 2 (two) times daily.   KLOR-CON M20 20 MEQ tablet Generic drug:  potassium chloride SA TAKE ONE TABLET BY MOUTH ONCE DAILY   Magnesium 250 MG Tabs Take 250 mg by mouth daily.   Melatonin 3 MG Caps Take 3 mg by mouth as needed (insomnia).   midodrine 10 MG  tablet Commonly known as:  PROAMATINE Take 1 tablet (10 mg total) by mouth 3 (three) times daily. What changed:  See the new instructions.   multivitamin with minerals Tabs tablet Take 1 tablet by mouth daily.   nutrition supplement (JUVEN) Pack Take 1 packet by mouth 2 (two) times daily between meals.   polyethylene glycol packet Commonly known as:  MIRALAX / GLYCOLAX Take 17 g by mouth 2 (two) times daily.   protein supplement shake Liqd Commonly known as:  PREMIER PROTEIN Take 325 mLs (11 oz total) by mouth daily.   rivaroxaban 20 MG Tabs tablet Commonly known as:  XARELTO Take 1 tablet (20 mg total) by mouth daily.   rosuvastatin 20 MG tablet Commonly known as:  CRESTOR TAKE ONE TABLET BY MOUTH ONCE A WEEK, FRIDAY   SPIRIVA HANDIHALER 18 MCG inhalation capsule Generic drug:  tiotropium INHALE ONE PUFF BY MOUTH ONCE DAILY   tamsulosin 0.4 MG Caps capsule Commonly known as:  FLOMAX TAKE 1 CAPSULE BY MOUTH ONCE DAILY   traMADol 50 MG tablet Commonly known as:  ULTRAM Take 1-2 tablets (50-100 mg total) by mouth every 12 (twelve) hours as needed for moderate pain or severe pain (and before hydrotherapy).   vancomycin IVPB Inject 1,000 mg into the vein every 12 (twelve) hours.       Contact information for follow-up providers    Rene Paci, MD Follow up.   Specialty:  Urology Why:  As needed Contact information: [address] [place] [address] [phone]            Contact information for after-discharge care    Destination    [facility] Follow up.   Service:  Skilled Nursing Contact information: [address] [place] [address] [phone]                 Allergies  Allergen Reactions  . Nsaids     Stomach pain  . Vicodin [Hydrocodone-Acetaminophen] Other (See Comments)    Unknown reaction many years ago  . Morphine And Related Nausea And Vomiting    Dizziness, light headed.  "Opiates cause tightness in chest".    Consultations:  Cardiology  Urology  ID  Procedures/Studies: Dg Chest 1 View  Result Date: 05/19/2017 CLINICAL DATA:  Shortness of breath . EXAM: CHEST 1 VIEW COMPARISON:  05/14/2017 . FINDINGS: Right PICC line noted in stable position. Cardiomegaly with pulmonary interstitial prominence and bilateral pleural effusions. Findings consistent with CHF . Findings have progressed prior exam. No pneumothorax . IMPRESSION: 1.  Right PICC line in stable position. 2. Congestive heart failure with bilateral pulmonary interstitial edema bilateral pleural effusions. Electronically Signed   By: Maisie Fus  Register   On: 05/19/2017 08:31   Dg Chest 2 View  Result Date: 05/14/2017 CLINICAL DATA:  84 year old male with a history of weakness. EXAM: CHEST  2 VIEW COMPARISON:  02/18/2017 FINDINGS: Cardiomediastinal silhouette likely unchanged with  rightward rotation accentuating the mediastinal contour. Calcifications of the aortic arch and descending thoracic aorta. Right upper extremity PICC, appears to terminate superior vena cava. Low lung volumes compare to the prior. Patchy opacities bilateral lungs. Blunting of the costophrenic sulcus on the lateral view. IMPRESSION: Lateral view demonstrates evidence of small pleural effusions. Low lung volumes with patchy opacities potentially representing chronic changes, atelectasis, and/or consolidation. Right upper extremity PICC appears to terminate in the superior vena cava. Electronically Signed   By: Gilmer Mor D.O.   On: 05/14/2017 14:15   Ct Head Wo Contrast  Result Date: 05/14/2017 CLINICAL DATA:  History of sepsis and altered level of consciousness EXAM: CT HEAD WITHOUT CONTRAST TECHNIQUE: Contiguous axial images were obtained from the base of the skull through the vertex without intravenous contrast. COMPARISON:  None. FINDINGS: Brain: Mild atrophic changes and chronic white matter ischemic changes are seen. No  findings to suggest acute hemorrhage, acute infarction or space-occupying mass lesion are noted. Vascular: No hyperdense vessel or unexpected calcification. Skull: Normal. Negative for fracture or focal lesion. Sinuses/Orbits: No acute finding. Other: None. IMPRESSION: Chronic atrophic and ischemic changes without acute abnormality. Electronically Signed   By: Alcide Clever M.D.   On: 05/14/2017 15:57   Ct Virtual Colonoscopy Diagnostic  Result Date: 05/09/2017 CLINICAL DATA:  Positive occult blood.  Polyps removed 10 years ago. EXAM: CT VIRTUAL COLONOSCOPY DIAGNOSTIC TECHNIQUE: The patient was given a standard bowel preparation with Gastrografin and barium for fluid and stool tagging respectively. The quality of the bowel preparation is poor. Automated CO2 insufflation of the colon was performed prior to image acquisition and colonic distention is poor. Image post processing was used to generate a 3D endoluminal fly-through projection of the colon and to electronically subtract stool/fluid as appropriate. COMPARISON:  CT abdomen and pelvis 01/19/2012 FINDINGS: VIRTUAL COLONOSCOPY The descending colon is completely bearing filled and nondistended on both decubitus and supine images. The cecum is completely barium filled on decubitus imaging. Study is nondiagnostic. I see no persistent polypoid filling defects or annular constricting lesions. Scattered diverticulum. Virtual colonoscopy is not designed to detect diminutive polyps (i.e., less than or equal to 5 mm), the presence or absence of which may not affect clinical management. CT ABDOMEN AND PELVIS WITHOUT CONTRAST Lower chest: No acute abnormality.  Mild cardiomegaly. Hepatobiliary: Gallstones layering in the gallbladder. No focal hepatic abnormality. Pancreas: No focal abnormality or ductal dilatation. Spleen: No focal abnormality.  Normal size. Adrenals/Urinary Tract: Small exophytic low-density lesion off the upper pole of the left kidney is stable since  prior study compatible with small cyst. No hydronephrosis. Adrenal glands are unremarkable. Foley catheter is within the bladder which is decompressed. Stomach/Bowel: Stomach and small bowel decompressed, grossly unremarkable. Vascular/Lymphatic: Diffuse aortic and iliac calcifications. No aneurysm or adenopathy. Reproductive: No visible focal abnormality. Other: No free fluid or free air. Musculoskeletal: Prior left hip replacement. No acute bony abnormality IMPRESSION: Suboptimal, nondiagnostic virtual colonoscopy due to patient's lack of mobility, poor prep, and under distention of the colon. No visible suspicious persistent polypoid filling defects or annular constricting lesions noted. If further evaluation is desired, I would recommend additional bowel prep and optical colonoscopy. Cholelithiasis. Aortoiliac atherosclerosis. Electronically Signed   By: Charlett Nose M.D.   On: 05/09/2017 09:30   Dg Chest Port 1 View  Result Date: 05/27/2017 CLINICAL DATA:  Cough and left-sided chest pain EXAM: PORTABLE CHEST 1 VIEW COMPARISON:  05/24/17 FINDINGS: Cardiac shadow is enlarged but stable. Aortic calcifications are again seen.  Right-sided PICC line is again noted in the mid superior vena cava. Vascular congestion is again seen with small bilateral pleural effusions stable from the prior exam. No new focal infiltrate is noted. IMPRESSION: Changes of vascular congestion and bilateral effusions stable from the prior study. Electronically Signed   By: Alcide Clever M.D.   On: 05/27/2017 09:28   Dg Chest Port 1 View  Result Date: 05/24/2017 CLINICAL DATA:  Shortness of breath.  COPD. EXAM: PORTABLE CHEST 1 VIEW COMPARISON:  05/19/2017. FINDINGS: PICC line noted with tip over the superior vena cava. Cardiomegaly with mild bilateral vascular prominence interstitial prominence. Bilateral pleural effusions. Findings consistent with CHF. Slight improvement from prior exam IMPRESSION: 1.  Right PICC line in stable  position. 2. Congestive heart failure bilateral from interstitial edema and bilateral pleural effusions. Slight improvement from prior exam. Electronically Signed   By: Maisie Fus  Register   On: 05/24/2017 13:15   Stress test 10/29/16 Study Highlights     The left ventricular ejection fraction is normal (55-65%).  Nuclear stress EF: 64%.  There was no ST segment deviation noted during stress.  The study is normal.  This is a low risk study.  Normal resting and stress perfusion. No ischemia or infarction EF 64%    ECHO 05/19/17 Study Conclusions  - Left ventricle: The cavity size was normal. Wall thickness was increased in a pattern of mild LVH. Systolic function was normal. The estimated ejection fraction was in the range of 60% to 65%. Incoordinate septal motion. Doppler parameters are consistent with abnormal left ventricular relaxation (grade 1 diastolic dysfunction). The E/e&' ratio is between 8-15, suggesting indeterminate LV filling pressure. - Aortic valve: Calcified leaflets. Moderate stenosis. Mean gradient (S): 16 mm Hg. Peak gradient (S): 27 mm Hg. Valve area (VTI): 1.08 cm^2. Valve area (Vmax): 1.12 cm^2. - Mitral valve: Mildly thickened leaflets . There was trivial regurgitation. - Left atrium: The atrium was normal in size. - Right ventricle: The cavity size was mildly dilated. Systolic function was normal. - Right atrium: Moderately dilated. - Tricuspid valve: There was mild regurgitation. - Pulmonary arteries: PA peak pressure: 42 mm Hg (S). - Inferior vena cava: The vessel was dilated. The respirophasic diameter changes were blunted (<50%), consistent with elevated central venous pressure. - Pericardium, extracardiac: A trivial pericardial effusion was identified posterior to the heart. Features were not consistent with tamponade physiology.  Impressions:  - Compared to a prior study in 08/2015, the LVEF is higher at  60-65% with incoordinate septal motion. The aortic stenosis is now moderate. There is mild RVE and moderate RAE with mild TR and RVSP of 42 mmHg, the IVC is dilated.  Subjective: Much more alert today, breathing better, "my butt hurts, that's it," referring to his wound which is stable.   Discharge Exam: Vitals:   05/28/17 0512 05/28/17 0859  BP: (!) 97/58   Pulse: 70   Resp: 16   Temp: 98 F (36.7 C)   SpO2: 97% 97%   General: Pt is alert, awake, not in acute distress Cardiovascular: Irreg, II/VI systolic murmur; distal pulses intact/symmetric. No dependent edema or JVD. Respiratory: CTA bilaterally, no wheezing, no rhonchi. Abdominal: Soft, tenderness that is migratory diffusely in the abdomen without rebound or guarding, ND, bowel sounds + Extremities: No edema, no cyanosis Skin: 6cm x 6.5cm x 3.8cm stage 4 sacral pressure ulcer with minimal serosanguinous discharge, packing in place without contamination. No purulence.   Labs: BNP (last 3 results) Recent Labs  05/19/17 0458  BNP 824.0*   Basic Metabolic Panel: Recent Labs  Lab 05/24/17 0525 05/25/17 0356 05/26/17 0429 05/27/17 0348 05/28/17 0449  NA 138 136 138 139 136  K 3.4* 3.5 3.3* 3.6 3.2*  CL 83* 84* 86* 90* 91*  CO2 46* 44* 43* 43* 39*  GLUCOSE 94 92 96 88 91  BUN 33* 34* 29* 29* 25*  CREATININE 0.86 0.76 0.82 0.95 0.79  CALCIUM 8.7* 8.7* 8.9 8.6* 8.3*  MG  --  2.5*  --  2.2  --    CBC: Recent Labs  Lab 05/22/17 0345 05/24/17 0525 05/25/17 0356  WBC 7.8 7.9 8.0  HGB 9.3* 8.7* 8.9*  HCT 30.0* 28.0* 28.0*  MCV 94.0 94.0 92.4  PLT 271 317 333   Urinalysis    Component Value Date/Time   COLORURINE YELLOW 05/15/2017 0652   APPEARANCEUR CLOUDY (A) 05/15/2017 0652   LABSPEC 1.016 05/15/2017 0652   PHURINE 5.0 05/15/2017 0652   GLUCOSEU NEGATIVE 05/15/2017 0652   HGBUR NEGATIVE 05/15/2017 0652   BILIRUBINUR NEGATIVE 05/15/2017 0652   KETONESUR NEGATIVE 05/15/2017 0652   PROTEINUR 100  (A) 05/15/2017 0652   UROBILINOGEN 0.2 08/06/2013 1006   NITRITE NEGATIVE 05/15/2017 0652   LEUKOCYTESUR LARGE (A) 05/15/2017 1610    Time coordinating discharge: Approximately 40 minutes  Hazeline Junker, MD  [facility] 05/28/2017, 1:10 PM Pager [phone]

## 2017-06-02 ENCOUNTER — Ambulatory Visit: Payer: Medicare Other | Admitting: Gastroenterology

## 2017-06-06 ENCOUNTER — Encounter: Payer: Self-pay | Admitting: Internal Medicine

## 2017-06-06 ENCOUNTER — Ambulatory Visit: Payer: Medicare Other | Admitting: Internal Medicine

## 2017-06-06 DIAGNOSIS — M869 Osteomyelitis, unspecified: Secondary | ICD-10-CM | POA: Diagnosis not present

## 2017-06-06 NOTE — Progress Notes (Signed)
Per verbal order from Dr Orvan Falconer, 42 cm Single Lumen Peripherally Inserted Central Catheter removed from right basilic, tip intact. No sutures present. RN confirmed length per chart. Dressing was clean and dry. Petroleum dressing applied. Patient and daughter advised no heavy lifting with this arm, leave dressing for 24 hours and call the office or seek emergent care if dressing becomes soaked with blood or sharp pain presents. Patient verbalized understanding and agreement.  Patient and daughter's questions answered to their satisfaction. Patient tolerated procedure well, RN rolled patient in wheelchair to check out. Andree Coss, RN

## 2017-06-06 NOTE — Assessment & Plan Note (Addendum)
He has now completed 41 days of total antibiotic therapy and it sounds like his wound is improving.  I will stop his antibiotics today and have his PICC removed here to save him a trip back to the hospital tomorrow.  I told him that he will need to continue to work on improved nutrition, offloading of pressure from the wound and good wound care in order to have continued healing.  I will be available as needed.

## 2017-06-06 NOTE — Progress Notes (Signed)
[facility]  Patient Active Problem List   Diagnosis Date Noted  . 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    Priority: High  . Infected pressure ulcer 02/07/2017    Priority: High  . Acute encephalopathy 05/14/2017  . Permanent atrial fibrillation (HCC) 05/14/2017  . Chronic respiratory failure with hypoxia (HCC) 05/14/2017  . Chronic combined systolic and diastolic CHF (congestive heart failure) (HCC) 05/14/2017  . Normocytic anemia 02/07/2017  . A-fib (HCC) 01/31/2017  . Polyneuropathy 01/31/2017  . S/P left TKA 01/24/2017  . Non-ischemic cardiomyopathy (HCC) 10/15/2016  . Chronic systolic congestive heart failure (HCC) 05/28/2016  . Orthostatic hypotension 09/20/2015  . Impaired glucose tolerance 11/25/2014  . Bifascicular block 12/18/2013  . Bilateral edema of lower extremity 10/25/2013  . S/P left THA, AA 08/13/2013  . Chronic anticoagulation 12/06/2012  . General weakness 07/05/2012  . Hypotension 01/18/2012  . Paroxysmal Atrial fibrillation - admitted with RVR 01/18/2012  . DJD (degenerative joint disease), lumbar 01/18/2012  . Hx of colonic polyps 11/02/2011  . BENIGN PROSTATIC HYPERTROPHY 05/13/2010  . SEBORRHEA CAPITIS 02/13/2010  . CHRONIC RHINITIS 06/02/2009  . Hyperlipidemia 11/01/2008  . INSOMNIA, CHRONIC, MILD 01/02/2008  . Essential hypertension 02/06/2007  . ASTHMA 02/06/2007    Patient's Medications  New Prescriptions   No medications on file  Previous Medications   ACETAMINOPHEN (TYLENOL) 500 MG TABLET    Take 2 tablets (1,000 mg total) by mouth every 8 (eight) hours.   ALBUTEROL (PROVENTIL HFA;VENTOLIN HFA) 108 (90 BASE) MCG/ACT INHALER    Inhale 2 puffs into the lungs. Every 6 hours as needed for wheezing or shortness of breath   AMINO ACIDS-PROTEIN HYDROLYS (FEEDING SUPPLEMENT, PRO-STAT SUGAR FREE 64,) LIQD    Take 30 mLs by mouth 2 (two) times daily.   AMIODARONE (PACERONE) 200 MG TABLET    TAKE ONE TABLET  BY MOUTH ONCE DAILY   BUMETANIDE (BUMEX) 1 MG TABLET    Take 1 tablet (1 mg total) by mouth 2 (two) times daily.   CETIRIZINE (ZYRTEC) 10 MG TABLET    Take 10 mg by mouth daily as needed for allergies.   DOCUSATE SODIUM 100 MG CAPS    Take 100 mg by mouth 2 (two) times daily.   FAMOTIDINE (PEPCID) 20 MG TABLET    Take 1 tablet (20 mg total) by mouth 2 (two) times daily.   FERROUS SULFATE (FERROUSUL) 325 (65 FE) MG TABLET    Take 1 tablet (325 mg total) by mouth 3 (three) times daily with meals.   GABAPENTIN (NEURONTIN) 400 MG CAPSULE    TAKE 1 CAPSULE BY MOUTH THREE TIMES DAILY   KLOR-CON M20 20 MEQ TABLET    TAKE ONE TABLET BY MOUTH ONCE DAILY   MAGNESIUM 250 MG TABS    Take 250 mg by mouth daily.    MELATONIN 3 MG CAPS    Take 3 mg by mouth as needed (insomnia).   MIDODRINE (PROAMATINE) 10 MG TABLET    Take 1 tablet (10 mg total) by mouth 3 (three) times daily.   MULTIPLE VITAMIN (MULTIVITAMIN WITH MINERALS) TABS TABLET    Take 1 tablet by mouth daily.   MULTIPLE VITAMINS-MINERALS (ICAPS AREDS FORMULA PO)    Take 1 capsule by mouth 2 (two) times daily.    NUTRITION SUPPLEMENT, JUVEN, (JUVEN) PACK    Take 1 packet by mouth 2 (two) times daily between meals.   POLYETHYLENE GLYCOL (MIRALAX / GLYCOLAX) PACKET  MarinusAbbe [phone] [address]Joanne Chars Mirian MorinusAbbe Duann([phone] >inus MawMyrna Blazerecondary school teacher[address]Joanne Chars Mirian Mo Shirline [phone] LimerickAbbe AmsterdamMarinus MawMyrna BlazerSecondary school teacher[address]Joanne Chars Mirian Mo Shirline [phone] LimerickAbbe AmsterdamMarinus Maw  INSOMNIA, CHRONIC, MILD 01/02/2008   Qualifier: Diagnosis of  By: Lovell Sheehan MD, Balinda Quails   . Macular degeneration 08-06-13   bilateral -sight impaired  . Obesity   . Paroxysmal Atrial fibrillation - admitted with RVR 01/18/2012  . S/P left THA, AA 08/13/2013  . Shortness of breath     Social History   Tobacco Use  . Smoking status: Former Smoker    Last attempt to quit: 05/17/1964    Years since quitting: 53.0  . Smokeless tobacco: Never Used  Substance Use Topics  . Alcohol use: No  . Drug use: No    Family History  Problem Relation Age of Onset  . Alzheimer's disease Mother   . Heart disease Father     Allergies  Allergen Reactions  . Nsaids     Stomach pain  . Vicodin [Hydrocodone-Acetaminophen] Other (See Comments)    Unknown reaction many years ago  . Morphine And Related Nausea And Vomiting    Dizziness, light headed. "Opiates cause tightness in chest".    Objective: Vitals:   06/06/17 1546  BP: (!) 95/57  Pulse: 64  Temp: 97.8 F (36.6 C)  TempSrc: Oral  SpO2: 95%   There is no height or weight on file to calculate BMI.  Physical Exam  Constitutional: He is oriented to person, place, and time.  He is in good spirits.  He is seated in a wheelchair.  Pulmonary/Chest: Effort normal.  Neurological: He is alert and oriented to person, place, and time.  Skin: No rash noted.  Right arm PICC site looks okay.  Psychiatric: Mood and affect normal.    Lab Results    Problem List Items Addressed  This Visit      High   Osteomyelitis of coccyx (HCC)    He has now completed 41 days of total antibiotic therapy and it sounds like his wound is improving.  I will stop his antibiotics today and have his PICC removed here to save him a trip back to the hospital tomorrow.  I told him that he will need to continue to work on improved nutrition, offloading of pressure from the wound and good wound care in order to have continued healing.  I will be available as needed.          Cliffton Asters, MD [facility] [phone] pager   [phone] cell 06/06/2017, 4:15 PM

## 2017-07-13 ENCOUNTER — Institutional Professional Consult (permissible substitution): Payer: Medicare Other | Admitting: Pulmonary Disease

## 2017-12-08 ENCOUNTER — Ambulatory Visit: Payer: Medicare Other

## 2018-01-27 ENCOUNTER — Telehealth: Payer: Self-pay | Admitting: Internal Medicine

## 2018-01-27 NOTE — Telephone Encounter (Signed)
Hi Dr. Leone Payor, we have received records from [place] [facility]. Dr. Patria Mane is referring pt for positive heme stool. His contact number is: [phone]. Records will be placed on your desk for review. Thank you

## 2018-02-01 NOTE — Telephone Encounter (Signed)
I reviewed the records and think an office visit to consider endoscopic evaluation makes sense. Looks like we will send him to Dr. Barron Alvine based upon availabity.

## 2018-02-03 NOTE — Telephone Encounter (Signed)
Patient is scheduled to see Dr.Cirigliano 02/13/2018 at 10am.

## 2018-02-13 ENCOUNTER — Ambulatory Visit: Payer: Medicare Other | Admitting: Gastroenterology

## 2018-02-16 ENCOUNTER — Encounter: Payer: Self-pay | Admitting: Gastroenterology

## 2018-02-16 ENCOUNTER — Ambulatory Visit (INDEPENDENT_AMBULATORY_CARE_PROVIDER_SITE_OTHER): Payer: Medicare Other | Admitting: Gastroenterology

## 2018-02-16 VITALS — BP 120/54 | HR 61 | Ht 67.0 in | Wt 186.0 lb

## 2018-02-16 DIAGNOSIS — J9612 Chronic respiratory failure with hypercapnia: Secondary | ICD-10-CM

## 2018-02-16 DIAGNOSIS — K921 Melena: Secondary | ICD-10-CM

## 2018-02-16 DIAGNOSIS — R195 Other fecal abnormalities: Secondary | ICD-10-CM

## 2018-02-16 DIAGNOSIS — Z7901 Long term (current) use of anticoagulants: Secondary | ICD-10-CM | POA: Diagnosis not present

## 2018-02-16 DIAGNOSIS — K5900 Constipation, unspecified: Secondary | ICD-10-CM | POA: Diagnosis not present

## 2018-02-16 NOTE — Patient Instructions (Signed)
If you are age 82 or older, your body mass index should be between 23-30. Your Body mass index is 29.13 kg/m. If this is out of the aforementioned range listed, please consider follow up with your Primary Care Provider.  If you are age 39 or younger, your body mass index should be between 19-25. Your Body mass index is 29.13 kg/m. If this is out of the aformentioned range listed, please consider follow up with your Primary Care Provider.   Follow the instructions on the Hemoccult cards and mail them back to [place] when you are finished or you may take them directly to the lab in the basement of the [facility] building. We will call you with the results.   It was a pleasure to see you today!  Vito Cirigliano, D.O.

## 2018-02-16 NOTE — Progress Notes (Signed)
Chief Complaint: FOBT positive stools  Referring Provider:     Fonnie Birkenhead, MD    HPI:     [name] is a 84 y.o. male referred from [facility] for evaluation of recent FOBT positive stools. He states he noticed BRB on tissue paper a little over a month ago. No associated abdominal or rectal pain. Sxs lasted 1 day then resolved and have not recurred. FOBT was positive at this time, prompting referral for further evaluation. CBC from [place] on 01/11/18 with Hgb/Hct 10.1/33.4, which is overall improved from prior studies.   Of note, [name] was diagnosed with Coccygeal osteomyeltitis with sepsis in Sept 2018, requiring mutliple debridements (necrotic bone in coccyx) and prolonged courses of Abx and ID f/u. He was discharged in January 2019 to SNF and since then has had slow closure of the wound. Per patient and daughter, wound nearly closed now. Interestingly, given the complicated course, he was seen by Dr. Marca [facility]) and sent for Virtual Colonoscopy in Dec 2018 in anticipation of possible colostomy to prevent recurrence wound infection. VC was a suboptimal study 2/2 poor mobility, poor prep, and underdistension of the colon, but no masses or annular constricting lesions noted.   Hx of constipation which is now well controlled with Miralax and Colace. Takes ferrous sulfate, so chronically dark stools, but no recent c/w change or concern for melena. No UGI sxs and tolerating all PO intake without issues.   Reports he has had colonoscopy x2. 1st in [place] with 8 polyps and repeat in [place] without any polyps.   Hx of chronic respiratory failure requiring BiPAP qhs and supplemental O2 when moving around or traveling (wearing currently at 3L).    Past Medical History:  Diagnosis Date  . A-fib (HCC)   . Acute systolic CHF (congestive heart failure), NYHA class 3 -- Unclear etilogy (? Afib related) EF down from  60-70% to 40%. 07/05/2012   Echo 2/21: LV upper limits of normal. EF- 40%. Cannot assess Diastolic function - Aortic Sclerosis, Mod-Severe Left Atrial dilation; Mild RV & RA dilation; Moderately elevated PA peak pressure: 43mm Hg     . Arthritis    knees  . Asthma   . ASTHMA 02/06/2007   Qualifier: Diagnosis of  By: Claiborne Billings CMA, Terance Ice    . BENIGN PROSTATIC HYPERTROPHY 05/13/2010   Qualifier: Diagnosis of  By: Amador Cunas  MD, Janett Labella   . Bifascicular block 12/18/2013  . BPH (benign prostatic hypertrophy)   . Bronchospasm, exercise-induced   . CHF (congestive heart failure) (HCC)    systolic, EF 40% (07/07/2012)  . Chronic kidney disease    stabilized, due to infection  . Chronic rhinitis 06/02/2009   Qualifier: Diagnosis of  By: Lovell Sheehan MD, Balinda Quails   . DJD (degenerative joint disease), lumbar 01/18/2012  . Dyslipidemia   . Edema    Bilateral - left lower leg greater than right- wears compression stockings  . General weakness 07/05/2012  . GERD (gastroesophageal reflux disease)   . History of elevated glucose 10/01/2009   Qualifier: Diagnosis of  By: Lovell Sheehan MD, Balinda Quails   . Hx of colonic polyps 11/02/2011  . Hypertension   . INSOMNIA, CHRONIC, MILD 01/02/2008   Qualifier: Diagnosis of  By: Lovell Sheehan MD, Balinda Quails   . Macular degeneration 08-06-13   bilateral -sight impaired  . Obesity   . Paroxysmal  Atrial fibrillation - admitted with RVR 01/18/2012  . S/P left THA, AA 08/13/2013  . Shortness of breath      Past Surgical History:  Procedure Laterality Date  . CARDIOVERSION N/A 07/07/2012   Procedure: CARDIOVERSION;  Surgeon: Chrystie Nose, MD;  [facility] ENDOSCOPY;  Service: Cardiovascular;  Laterality: N/A;  . CARDIOVERSION N/A 08/30/2012   Procedure: CARDIOVERSION;  Surgeon: Chrystie Nose, MD;  Location: [facility] ENDOSCOPY;  Service: Cardiovascular;  Laterality: N/A;  . IR FLUORO GUIDE CV LINE RIGHT  04/15/2017  . IR US GUIDE VASC ACCESS RIGHT  04/15/2017  . IRRIGATION AND DEBRIDEMENT  ABSCESS N/A 02/08/2017   Procedure: IRRIGATION AND DEBRIDEMENT SACRAL ULCER;  Surgeon: Berna Bue, MD;  Location: [facility] ORS;  Service: General;  Laterality: N/A;  . TOTAL HIP ARTHROPLASTY Left 08/13/2013   Procedure: LEFT TOTAL HIP ARTHROPLASTY ANTERIOR APPROACH;  Surgeon: [name], MD;  Location: [facility] ORS;  Service: Orthopedics;  Laterality: Left;  . TOTAL KNEE ARTHROPLASTY Left 01/24/2017   Procedure: LEFT TOTAL KNEE ARTHROPLASTY;  Surgeon: Durene Romans, MD;  Location: [facility] ORS;  Service: Orthopedics;  Laterality: Left;  90 mins  . TRANSTHORACIC ECHOCARDIOGRAM  07/07/2012   ef 40%; mild MR; LA mod-severely dilated; RA mildly dilated; RV systolic function mildly reduced; RV systolic pressure increase - mod pulm htn  . VASCULAR SURGERY  removed varicose veins  . VASECTOMY    . viritual colonoscopy  04/2017   Dr Marca Ancona   Family History  Problem Relation Age of Onset  . Alzheimer's disease Mother   . Heart disease Father   . Colon cancer Neg Hx   . Esophageal cancer Neg Hx    Social History   Tobacco Use  . Smoking status: Former Games developer  . Smokeless tobacco: Never Used  . Tobacco comment: quit in 1956  Substance Use Topics  . Alcohol use: No  . Drug use: No   Current Outpatient Medications  Medication Sig Dispense Refill  . acetaminophen (TYLENOL) 500 MG tablet Take 2 tablets (1,000 mg total) by mouth every 8 (eight) hours. 30 tablet 0  . albuterol (PROVENTIL HFA;VENTOLIN HFA) 108 (90 Base) MCG/ACT inhaler Inhale 2 puffs into the lungs. Every 6 hours as needed for wheezing or shortness of breath    . Amino Acids-Protein Hydrolys (FEEDING SUPPLEMENT, PRO-STAT SUGAR FREE 64,) LIQD Take 30 mLs by mouth 2 (two) times daily.    [name] amiodarone (PACERONE) 200 MG tablet TAKE ONE TABLET BY MOUTH ONCE DAILY 90 tablet 3  . bumetanide (BUMEX) 1 MG tablet Take 1 tablet (1 mg total) by mouth 2 (two) times daily. 60 tablet 5  . cetirizine (ZYRTEC) 10 MG tablet Take 10 mg by mouth daily as needed  for allergies.    [name] docusate sodium 100 MG CAPS Take 100 mg by mouth 2 (two) times daily. 10 capsule 0  . famotidine (PEPCID) 20 MG tablet Take 1 tablet (20 mg total) by mouth 2 (two) times daily.    . ferrous sulfate (FERROUSUL) 325 (65 FE) MG tablet Take 1 tablet (325 mg total) by mouth 3 (three) times daily with meals.  3  . gabapentin (NEURONTIN) 400 MG capsule TAKE 1 CAPSULE BY MOUTH THREE TIMES DAILY 270 capsule 1  . KLOR-CON M20 20 MEQ tablet TAKE ONE TABLET BY MOUTH ONCE DAILY 60 tablet 5  . Magnesium 250 MG TABS Take 250 mg by mouth daily.     . Melatonin 3 MG CAPS Take 3 mg by mouth  as needed (insomnia).    . midodrine (PROAMATINE) 10 MG tablet Take 1 tablet (10 mg total) by mouth 3 (three) times daily. 90 tablet 2  . Multiple Vitamin (MULTIVITAMIN WITH MINERALS) TABS tablet Take 1 tablet by mouth daily.    . Multiple Vitamins-Minerals (ICAPS AREDS FORMULA PO) Take 1 capsule by mouth 2 (two) times daily.     . nutrition supplement, JUVEN, (JUVEN) PACK Take 1 packet by mouth 2 (two) times daily between meals.  0  . polyethylene glycol (MIRALAX / GLYCOLAX) packet Take 17 g by mouth 2 (two) times daily. 14 each 0  . protein supplement shake (PREMIER PROTEIN) LIQD Take 325 mLs (11 oz total) by mouth daily.  0  . rivaroxaban (XARELTO) 20 MG TABS tablet Take 1 tablet (20 mg total) by mouth daily. 21 tablet 0  . rosuvastatin (CRESTOR) 20 MG tablet TAKE ONE TABLET BY MOUTH ONCE A WEEK, FRIDAY 12 tablet 2  . SPIRIVA HANDIHALER 18 MCG inhalation capsule INHALE ONE PUFF BY MOUTH ONCE DAILY 90 capsule 3  . tamsulosin (FLOMAX) 0.4 MG CAPS capsule TAKE 1 CAPSULE BY MOUTH ONCE DAILY 30 capsule 5  . traMADol (ULTRAM) 50 MG tablet Take 1-2 tablets (50-100 mg total) by mouth every 12 (twelve) hours as needed for moderate pain or severe pain (and before hydrotherapy). 12 tablet 0   No current facility-administered medications for this visit.    Allergies  Allergen Reactions  . Nsaids     Stomach pain   . Vicodin [Hydrocodone-Acetaminophen] Other (See Comments)    Unknown reaction many years ago  . Morphine And Related Nausea And Vomiting    Dizziness, light headed. "Opiates cause tightness in chest".     Review of Systems: All systems reviewed and negative except where noted in HPI.     Physical Exam:    Wt Readings from Last 3 Encounters:  02/16/18 186 lb (84.4 kg)  05/27/17 202 lb 9.6 oz (91.9 kg)  02/21/17 203 lb 7.8 oz (92.3 kg)    BP (!) 120/54   Pulse 61   Ht 5\' 7"  (1.702 m) Comment: per pt. Can't stand  Wt 186 lb (84.4 kg) Comment: per pt  BMI 29.13 kg/m  Constitutional:  Pleasant, in no acute distress. Psychiatric: Normal mood and affect. Behavior is normal. EENT: Pupils normal.  Conjunctivae are normal. No scleral icterus. Monroe in place in nares.  Neck supple. No cervical LAD. Cardiovascular: Normal rate, regular rhythm. No edema Pulmonary/chest: Effort normal and breath sounds normal. No wheezing, rales or rhonchi. Abdominal: Soft, nondistended, nontender. Bowel sounds active throughout. There are no masses palpable. No hepatomegaly. Neurological: Alert and oriented to person place and time. Skin: Skin is warm and dry. No rashes noted.   ASSESSMENT AND PLAN;   [name] is a 84 y.o. male presenting with recent overt hematochezia with subsequent FOBT positive stools at that same time. Hematochezia lasting 1 day and has resolved and not recurred.   1) Hematochezia: Discussed the ddx to include benign anorectal d/o (ie, hemorrhoids, fissure), bleeding from coccygeal wound, or less likely mass not seen on recent VC. Given the fact that this is overt GIB that has since resolved, and not occult GIB noted on FOBT with otherwise normal stools, discussed the value in colonoscopy in this elderly male with multiple co-morbidities. Additionally, discussed the possible risks of prep in this patient with reduced mobility and a slowly healing coccygeal wound, and he would  like to proceed as below:  -  Repeat FOBT now. If negative, he wishes to decline any further endoscopic evaluation - If FOBT positive, he would consider colonoscopy vs repeat VC to r/o malignancy  2) FOBT positive stools: As above  3) Constipation: Well controlled on current regimen. No changes at this time  4) Chronic respiratory failure: Requires O2 and nightly BiPAP. If procedures indicated, will need to be [facility] at [facility]  5) Systemic anticoagulation: No issues continuing at this time. If procedures, will hold Xarelto x2 days (and hold ferrous sulfate x7 days)  RTC prn  Shellia Cleverly, DO, FACG  02/16/2018, 4:20 PM   [name], MD

## 2018-03-08 ENCOUNTER — Other Ambulatory Visit (INDEPENDENT_AMBULATORY_CARE_PROVIDER_SITE_OTHER): Payer: Medicare Other

## 2018-03-08 DIAGNOSIS — K921 Melena: Secondary | ICD-10-CM

## 2018-03-08 DIAGNOSIS — R195 Other fecal abnormalities: Secondary | ICD-10-CM

## 2018-03-08 LAB — HEMOCCULT SLIDES (X 3 CARDS)
Fecal Occult Blood: NEGATIVE
OCCULT 1: NEGATIVE
OCCULT 2: NEGATIVE
OCCULT 3: NEGATIVE
OCCULT 4: NEGATIVE
OCCULT 5: NEGATIVE

## 2018-05-23 ENCOUNTER — Encounter: Payer: Medicare Other | Attending: Physician Assistant | Admitting: Physician Assistant

## 2018-05-23 ENCOUNTER — Other Ambulatory Visit
Admission: RE | Admit: 2018-05-23 | Discharge: 2018-05-23 | Disposition: A | Discharge status: Home / Self Care | Payer: Medicare Other | Source: Ambulatory Visit | Attending: Physician Assistant | Admitting: Physician Assistant

## 2018-05-23 DIAGNOSIS — M6281 Muscle weakness (generalized): Secondary | ICD-10-CM | POA: Diagnosis not present

## 2018-05-23 DIAGNOSIS — Z96652 Presence of left artificial knee joint: Secondary | ICD-10-CM | POA: Insufficient documentation

## 2018-05-23 DIAGNOSIS — B999 Unspecified infectious disease: Secondary | ICD-10-CM | POA: Insufficient documentation

## 2018-05-23 DIAGNOSIS — L89154 Pressure ulcer of sacral region, stage 4: Secondary | ICD-10-CM | POA: Diagnosis not present

## 2018-05-23 DIAGNOSIS — I10 Essential (primary) hypertension: Secondary | ICD-10-CM | POA: Diagnosis not present

## 2018-05-23 DIAGNOSIS — J449 Chronic obstructive pulmonary disease, unspecified: Secondary | ICD-10-CM | POA: Diagnosis not present

## 2018-05-23 DIAGNOSIS — Z87891 Personal history of nicotine dependence: Secondary | ICD-10-CM | POA: Diagnosis not present

## 2018-05-26 LAB — AEROBIC CULTURE  (SUPERFICIAL SPECIMEN)

## 2018-05-26 LAB — AEROBIC CULTURE W GRAM STAIN (SUPERFICIAL SPECIMEN)

## 2018-05-26 NOTE — Progress Notes (Signed)
Hacker, [name] ([id number]) Visit Report for 05/23/2018 Allergy List Details Patient Name: Formica, Vannak E. Date of Service: 05/23/2018 10:30 AM Medical Record Number: [id number] Patient Account Number: [id number] Date of Birth/Sex: 12-06-1926 (85 y.o. M) Treating RN: Arnette NorrisBiell, Kristina Primary Care Jandy Brackens: Eleonore ChiquitoKWIATKOWSKI, PETER Other Clinician: Referring Aharon Carriere: Durenda HurtARNAEZ ZAPATA, Rexford MausGERARDO Treating Makynlie Rossini/Extender: Linwood DibblesSTONE III, HOYT Weeks in Treatment: 0 Allergies Active Allergies NSAIDS (Non-Steroidal Anti-Inflammatory Drug) Vicodin morphine Allergy Notes Electronic Signature(s) Signed: 05/25/2018 2:41:13 PM By: Arnette NorrisBiell, Kristina Entered By: Arnette NorrisBiell, Kristina on 05/23/2018 10:40:49 Ponzo, Mikeal E. ([id number]) -------------------------------------------------------------------------------- Arrival Information Details Patient Name: Lipton, Obrian E. Date of Service: 05/23/2018 10:30 AM Medical Record Number: [id number] Patient Account Number: [id number] Date of Birth/Sex: 12-06-1926 (85 y.o. M) Treating RN: Rema JasmineNg, Wendi Primary Care Conroy Goracke: Eleonore ChiquitoKWIATKOWSKI, PETER Other Clinician: Referring Lafonda Patron: Dierdre HighmanARNAEZ ZAPATA, GERARDO Treating Berkeley Veldman/Extender: Linwood DibblesSTONE III, HOYT Weeks in Treatment: 0 Visit Information Patient Arrived: Wheel Chair Arrival Time: 10:19 Accompanied By: daughter and caregiver Transfer Assistance: Nurse, adultHoyer Lift Patient Identification Verified: Yes Secondary Verification Process Yes Completed: History Since Last Visit Added or deleted any medications: No Any new allergies or adverse reactions: No Had a fall or experienced change in activities of daily living that may affect risk of falls: No Signs or symptoms of abuse/neglect since last visito No Hospitalized since last visit: No Implantable device outside of the clinic excluding cellular tissue based products placed in the center since last visit: No Pain Present Now: Yes Electronic Signature(s) Signed: 05/23/2018 1:34:38 PM By:  Rema JasmineNg, Wendi Entered By: Rema JasmineNg, Wendi on 05/23/2018 10:29:32 Leppert, Dquan E. ([id number]) -------------------------------------------------------------------------------- Clinic Level of Care Assessment Details Patient Name: Nila, Gabino E. Date of Service: 05/23/2018 10:30 AM Medical Record Number: [id number] Patient Account Number: [id number] Date of Birth/Sex: 12-06-1926 (85 y.o. M) Treating RN: Curtis Sitesorthy, Joanna Primary Care Pernell Dikes: Eleonore ChiquitoKWIATKOWSKI, PETER Other Clinician: Referring Weltha Cathy: Dierdre HighmanARNAEZ ZAPATA, GERARDO Treating Keyauna Graefe/Extender: Linwood DibblesSTONE III, HOYT Weeks in Treatment: 0 Clinic Level of Care Assessment Items TOOL 1 Quantity Score []  - Use when EandM and Procedure is performed on INITIAL visit 0 ASSESSMENTS - Nursing Assessment / Reassessment X - General Physical Exam (combine w/ comprehensive assessment (listed just below) when 1 20 performed on new pt. evals) X- 1 25 Comprehensive Assessment (HX, ROS, Risk Assessments, Wounds Hx, etc.) ASSESSMENTS - Wound and Skin Assessment / Reassessment []  - Dermatologic / Skin Assessment (not related to wound area) 0 ASSESSMENTS - Ostomy and/or Continence Assessment and Care X - Incontinence Assessment and Management 1 10 []  - 0 Ostomy Care Assessment and Management (repouching, etc.) PROCESS - Coordination of Care X - Simple Patient / Family Education for ongoing care 1 15 []  - 0 Complex (extensive) Patient / Family Education for ongoing care []  - 0 Staff obtains ChiropractorConsents, Records, Test Results / Process Orders []  - 0 Staff telephones HHA, Nursing Homes / Clarify orders / etc []  - 0 Routine Transfer to another Facility (non-emergent condition) []  - 0 Routine Hospital Admission (non-emergent condition) X- 1 15 New Admissions / Manufacturing engineernsurance Authorizations / Ordering NPWT, Apligraf, etc. []  - 0 Emergency Hospital Admission (emergent condition) PROCESS - Special Needs []  - Pediatric / Minor Patient Management 0 []  - 0 Isolation Patient  Management []  - 0 Hearing / Language / Visual special needs []  - 0 Assessment of Community assistance (transportation, D/C planning, etc.) []  - 0 Additional assistance / Altered mentation X- 1 15 Support Surface(s) Assessment (bed, cushion, seat, etc.) Schoof, Miracle E. ([id number]) INTERVENTIONS - Miscellaneous []  - External ear exam 0 X- 1 10  Patient Transfer (multiple staff / Nurse, adultHoyer Lift / Similar devices) []  - 0 Simple Staple / Suture removal (25 or less) []  - 0 Complex Staple / Suture removal (26 or more) []  - 0 Hypo/Hyperglycemic Management (do not check if billed separately) []  - 0 Ankle / Brachial Index (ABI) - do not check if billed separately Has the patient been seen at the hospital within the last three years: Yes Total Score: 110 Level Of Care: New/Established - Level 3 Electronic Signature(s) Signed: 05/23/2018 4:56:07 PM By: Curtis Sitesorthy, Joanna Entered By: Curtis Sitesorthy, Joanna on 05/23/2018 12:07:44 Lewing, Cypher EMarland Kitchen. ([id number]) -------------------------------------------------------------------------------- Lower Extremity Assessment Details Patient Name: Raker, Windel E. Date of Service: 05/23/2018 10:30 AM Medical Record Number: [id number] Patient Account Number: [id number] Date of Birth/Sex: 1927-03-04 (85 y.o. M) Treating RN: Arnette NorrisBiell, Kristina Primary Care Raelin Pixler: Eleonore ChiquitoKWIATKOWSKI, PETER Other Clinician: Referring Hosey Burmester: Durenda HurtARNAEZ ZAPATA, Rexford MausGERARDO Treating Daric Koren/Extender: Linwood DibblesSTONE III, HOYT Weeks in Treatment: 0 Electronic Signature(s) Signed: 05/25/2018 2:41:13 PM By: Arnette NorrisBiell, Kristina Entered By: Arnette NorrisBiell, Kristina on 05/23/2018 10:55:18 Kabat, Jassiel E. ([id number]) -------------------------------------------------------------------------------- Multi Wound Chart Details Patient Name: Nobile, Donovin E. Date of Service: 05/23/2018 10:30 AM Medical Record Number: [id number] Patient Account Number: [id number] Date of Birth/Sex: 1927-03-04 (85 y.o. M) Treating RN: Curtis Sitesorthy,  Joanna Primary Care Imri Lor: Eleonore ChiquitoKWIATKOWSKI, PETER Other Clinician: Referring Hula Tasso: Dierdre HighmanARNAEZ ZAPATA, GERARDO Treating Jurni Cesaro/Extender: Linwood DibblesSTONE III, HOYT Weeks in Treatment: 0 Vital Signs Height(in): 67 Pulse(bpm): 69 Weight(lbs): 186 Blood Pressure(mmHg): 106/46 Body Mass Index(BMI): 29 Temperature(F): 98.2 Respiratory Rate 16 (breaths/min): Photos: [2:No Photos] [N/A:N/A] Wound Location: [2:Sacrum - Medial] [N/A:N/A] Wounding Event: [2:Pressure Injury] [N/A:N/A] Primary Etiology: [2:Pressure Ulcer] [N/A:N/A] Comorbid History: [2:Chronic Obstructive Pulmonary Disease (COPD), Congestive Heart Failure, Hypertension, Osteoarthritis] [N/A:N/A] Date Acquired: [2:02/14/2017] [N/A:N/A] Weeks of Treatment: [2:0] [N/A:N/A] Wound Status: [2:Open] [N/A:N/A] Measurements L x W x D [2:4x7.2x3.3] [N/A:N/A] (cm) Area (cm) : [2:22.619] [N/A:N/A] Volume (cm) : [2:74.644] [N/A:N/A] % Reduction in Area: [2:0.00%] [N/A:N/A] % Reduction in Volume: [2:0.00%] [N/A:N/A] Starting Position 1 [2:3] (o'clock): Ending Position 1 [2:5] (o'clock): Maximum Distance 1 (cm): [2:4.8] Undermining: [2:Yes] [N/A:N/A] Classification: [2:Category/Stage IV] [N/A:N/A] Exudate Amount: [2:Large] [N/A:N/A] Exudate Type: [2:Purulent] [N/A:N/A] Exudate Color: [2:yellow, brown, green] [N/A:N/A] Foul Odor After Cleansing: [2:Yes] [N/A:N/A] Odor Anticipated Due to [2:No] [N/A:N/A] Product Use: Wound Margin: [2:Epibole] [N/A:N/A] Granulation Amount: [2:None Present (0%)] [N/A:N/A] Necrotic Amount: [2:Large (67-100%)] [N/A:N/A] Necrotic Tissue: [2:Eschar, Adherent Slough] [N/A:N/A] Exposed Structures: [2:Fascia: Yes Fat Layer (Subcutaneous Tissue) Exposed: Yes] [N/A:N/A] Muscle: Yes Tendon: No Joint: No Bone: No Epithelialization: None N/A N/A Periwound Skin Texture: Excoriation: No N/A N/A Induration: No Callus: No Crepitus: No Rash: No Scarring: No Periwound Skin Moisture: Dry/Scaly: Yes N/A  N/A Maceration: No Periwound Skin Color: Erythema: Yes N/A N/A Atrophie Blanche: No Cyanosis: No Ecchymosis: No Hemosiderin Staining: No Mottled: No Pallor: No Rubor: No Erythema Location: Red Streaks N/A N/A Temperature: No Abnormality N/A N/A Tenderness on Palpation: Yes N/A N/A Wound Preparation: Ulcer Cleansing: N/A N/A Rinsed/Irrigated with Saline Topical Anesthetic Applied: Other: lidocaine 4% Treatment Notes Electronic Signature(s) Signed: 05/23/2018 4:56:07 PM By: Curtis Sitesorthy, Joanna Entered By: Curtis Sitesorthy, Joanna on 05/23/2018 11:32:43 Heavner, Karna ChristmasEVERETT E. ([id number]) -------------------------------------------------------------------------------- Multi-Disciplinary Care Plan Details Patient Name: Heldt, Raheem E. Date of Service: 05/23/2018 10:30 AM Medical Record Number: [id number] Patient Account Number: [id number] Date of Birth/Sex: 1927-03-04 (85 y.o. M) Treating RN: Curtis Sitesorthy, Joanna Primary Care Kyree Fedorko: Eleonore ChiquitoKWIATKOWSKI, PETER Other Clinician: Referring Nolah Krenzer: Dierdre HighmanARNAEZ ZAPATA, GERARDO Treating Chudney Scheffler/Extender: Linwood DibblesSTONE III, HOYT Weeks in Treatment: 0 Active Inactive Abuse / Safety / Falls / Self Care Management Nursing Diagnoses:  Impaired physical mobility Goals: Patient will not develop complications from immobility Date Initiated: 05/23/2018 Target Resolution Date: 08/19/2018 Goal Status: Active Interventions: Assess fall risk on admission and as needed Notes: Nutrition Nursing Diagnoses: Potential for alteratiion in Nutrition/Potential for imbalanced nutrition Goals: Patient/caregiver agrees to and verbalizes understanding of need to use nutritional supplements and/or vitamins as prescribed Date Initiated: 05/23/2018 Target Resolution Date: 08/19/2018 Goal Status: Active Interventions: Assess patient nutrition upon admission and as needed per policy Notes: Orientation to the Wound Care Program Nursing Diagnoses: Knowledge deficit related to the wound healing center  program Goals: Patient/caregiver will verbalize understanding of the Wound Healing Center Program Date Initiated: 05/23/2018 Target Resolution Date: 08/19/2018 Goal Status: Active Interventions: Provide education on orientation to the wound center Meriwether, Davione E. ([id number]) Notes: Pressure Nursing Diagnoses: Knowledge deficit related to causes and risk factors for pressure ulcer development Goals: Patient will remain free from development of additional pressure ulcers Date Initiated: 05/23/2018 Target Resolution Date: 08/19/2018 Goal Status: Active Interventions: Assess potential for pressure ulcer upon admission and as needed Notes: Wound/Skin Impairment Nursing Diagnoses: Impaired tissue integrity Goals: Ulcer/skin breakdown will heal within 14 weeks Date Initiated: 05/23/2018 Target Resolution Date: 08/19/2018 Goal Status: Active Interventions: Assess patient/caregiver ability to obtain necessary supplies Assess patient/caregiver ability to perform ulcer/skin care regimen upon admission and as needed Assess ulceration(s) every visit Notes: Electronic Signature(s) Signed: 05/23/2018 4:56:07 PM By: Curtis Sites Entered By: Curtis Sites on 05/23/2018 11:27:44 Kelleher, Coty E. ([id number]) -------------------------------------------------------------------------------- Pain Assessment Details Patient Name: Blattner, Marissa E. Date of Service: 05/23/2018 10:30 AM Medical Record Number: [id number] Patient Account Number: [id number] Date of Birth/Sex: December 10, 1926 (85 y.o. M) Treating RN: Rema Jasmine Primary Care Marcell Chavarin: Eleonore Chiquito Other Clinician: Referring Darlyn Repsher: Dierdre Highman Treating Micheala Morissette/Extender: Linwood Dibbles, HOYT Weeks in Treatment: 0 Active Problems Location of Pain Severity and Description of Pain Patient Has Paino Yes Site Locations Rate the pain. Current Pain Level: 9 Pain Management and Medication Current Pain Management: Electronic  Signature(s) Signed: 05/23/2018 1:34:38 PM By: Rema Jasmine Entered By: Rema Jasmine on 05/23/2018 10:29:44 Mcray, Karna Christmas ([id number]) -------------------------------------------------------------------------------- Patient/Caregiver Education Details Patient Name: Fehrenbach, Luvern E. Date of Service: 05/23/2018 10:30 AM Medical Record Number: [id number] Patient Account Number: [id number] Date of Birth/Gender: 03-Apr-1927 (85 y.o. M) Treating RN: Curtis Sites Primary Care Physician: Eleonore Chiquito Other Clinician: Referring Physician: Dierdre Highman Treating Physician/Extender: Skeet Simmer in Treatment: 0 Education Assessment Education Provided To: Caregiver SNF nurses Education Topics Provided Wound/Skin Impairment: Handouts: Other: wound care orders Methods: Printed Electronic Signature(s) Signed: 05/23/2018 4:56:07 PM By: Curtis Sites Entered By: Curtis Sites on 05/23/2018 12:08:43 Leyendecker, Monique EMarland Kitchen ([id number]) -------------------------------------------------------------------------------- Wound Assessment Details Patient Name: Higdon, Oziah E. Date of Service: 05/23/2018 10:30 AM Medical Record Number: [id number] Patient Account Number: [id number] Date of Birth/Sex: August 10, 1926 (85 y.o. M) Treating RN: Arnette Norris Primary Care Meela Wareing: Eleonore Chiquito Other Clinician: Referring Josie Burleigh: Dierdre Highman Treating Pallavi Clifton/Extender: Linwood Dibbles, HOYT Weeks in Treatment: 0 Wound Status Wound Number: 2 Primary Pressure Ulcer Etiology: Wound Location: Sacrum - Medial Wound Open Wounding Event: Pressure Injury Status: Date Acquired: 02/14/2017 Comorbid Chronic Obstructive Pulmonary Disease Weeks Of Treatment: 0 History: (COPD), Congestive Heart Failure, Clustered Wound: No Hypertension, Osteoarthritis Photos Photo Uploaded By: Arnette Norris on 05/23/2018 16:14:50 Wound Measurements Length: (cm) 4 Width: (cm) 7.2 Depth: (cm) 3.3 Area:  (cm) 22.619 Volume: (cm) 74.644 % Reduction in Area: 0% % Reduction in Volume: 0% Epithelialization: None Undermining: Yes Starting Position (o'clock): 3 Ending Position (o'clock):  5 Maximum Distance: (cm) 4.8 Wound Description Classification: Category/Stage IV Wound Margin: Epibole Exudate Amount: Large Exudate Type: Purulent Exudate Color: yellow, brown, green Foul Odor After Cleansing: Yes Due to Product Use: No Slough/Fibrino Yes Wound Bed Granulation Amount: None Present (0%) Exposed Structure Necrotic Amount: Large (67-100%) Fascia Exposed: Yes Necrotic Quality: Eschar, Adherent Slough Fat Layer (Subcutaneous Tissue) Exposed: Yes Tendon Exposed: No Muscle Exposed: Yes Necrosis of Muscle: No Recendez, Rutledge E. ([id number]) Joint Exposed: No Bone Exposed: No Periwound Skin Texture Texture Color No Abnormalities Noted: No No Abnormalities Noted: No Callus: No Atrophie Blanche: No Crepitus: No Cyanosis: No Excoriation: No Ecchymosis: No Induration: No Erythema: Yes Rash: No Erythema Location: Red Streaks Scarring: No Hemosiderin Staining: No Mottled: No Moisture Pallor: No No Abnormalities Noted: No Rubor: No Dry / Scaly: Yes Maceration: No Temperature / Pain Temperature: No Abnormality Tenderness on Palpation: Yes Wound Preparation Ulcer Cleansing: Rinsed/Irrigated with Saline Topical Anesthetic Applied: Other: lidocaine 4%, Treatment Notes Wound #2 (Medial Sacrum) Notes Dakins moistened gauze, xtrasorb, abd secured with tape Electronic Signature(s) Signed: 05/24/2018 4:13:45 PM By: Rema Jasmine Signed: 05/25/2018 2:41:13 PM By: Arnette Norris Entered By: Rema Jasmine on 05/23/2018 13:36:13 Brownley, Edrian E. ([id number]) -------------------------------------------------------------------------------- Vitals Details Patient Name: Imran, Dagon E. Date of Service: 05/23/2018 10:30 AM Medical Record Number: [id number] Patient Account Number: [id number] Date  of Birth/Sex: [birth year] (85 y.o. M) Treating RN: Rema Jasmine Primary Care Tavia Stave: Eleonore Chiquito Other Clinician: Referring Laketra Bowdish: Dierdre Highman Treating Daxtyn Rottenberg/Extender: Linwood Dibbles, HOYT Weeks in Treatment: 0 Vital Signs Time Taken: 10:29 Temperature (F): 98.2 Height (in): 67 Pulse (bpm): 69 Weight (lbs): 186 Respiratory Rate (breaths/min): 16 Source: Stated Blood Pressure (mmHg): 106/46 Body Mass Index (BMI): 29.1 Reference Range: 80 - 120 mg / dl Electronic Signature(s) Signed: 05/23/2018 1:34:38 PM By: Rema Jasmine Entered ByRema Jasmine on 05/23/2018 10:30:54

## 2018-05-26 NOTE — Progress Notes (Signed)
Amburn, KELLER CUCCO ([id number]) Visit Report for 05/23/2018 Abuse/Suicide Risk Screen Details Patient Name: [name], [name] Date of Service: 05/23/2018 10:30 AM Medical Record Number: [id number] Patient Account Number: [id number] Date of Birth/Sex: February 03, 1927 (85 y.o. M) Treating RN: Arnette Norris Primary Care Tyheim Vanalstyne: Eleonore Chiquito Other Clinician: Referring Conita Amenta: Dierdre Highman Treating Cruz Devilla/Extender: Linwood Dibbles, HOYT Weeks in Treatment: 0 Abuse/Suicide Risk Screen Items Answer ABUSE/SUICIDE RISK SCREEN: Has anyone close to you tried to hurt or harm you recentlyo No Do you feel uncomfortable with anyone in your familyo No Has anyone forced you do things that you didnot want to doo No Do you have any thoughts of harming yourselfo No Patient displays signs or symptoms of abuse and/or neglect. No Electronic Signature(s) Signed: 05/25/2018 2:41:13 PM By: Arnette Norris Entered By: Arnette Norris on 05/23/2018 10:48:32 Arzola, Tre E. ([id number]) -------------------------------------------------------------------------------- Activities of Daily Living Details Patient Name: Manchester, [name] Date of Service: 05/23/2018 10:30 AM Medical Record Number: [id number] Patient Account Number: [id number] Date of Birth/Sex: 1926/08/18 (85 y.o. M) Treating RN: Arnette Norris Primary Care Rana Adorno: Eleonore Chiquito Other Clinician: Referring Dajha Urquilla: Dierdre Highman Treating Josyah Achor/Extender: Linwood Dibbles, HOYT Weeks in Treatment: 0 Activities of Daily Living Items Answer Activities of Daily Living (Please select one for each item) Drive Automobile Not Able Take Medications Need Assistance Use Telephone Need Assistance Care for Appearance Need Assistance Use Toilet Need Assistance Bath / Shower Need Assistance Dress Self Need Assistance Feed Self Completely Able Walk Not Able Get In / Out Bed Need Assistance Housework Not Able Prepare Meals Not Able Handle  Money Not Able Shop for Self Not Able Electronic Signature(s) Signed: 05/25/2018 2:41:13 PM By: Arnette Norris Entered By: Arnette Norris on 05/23/2018 10:49:15 Turpen, Karna Christmas ([id number]) -------------------------------------------------------------------------------- Education Assessment Details Patient Name: Oak, [name] Date of Service: 05/23/2018 10:30 AM Medical Record Number: [id number] Patient Account Number: [id number] Date of Birth/Sex: June 16, 1926 (85 y.o. M) Treating RN: Arnette Norris Primary Care Kadeshia Kasparian: Eleonore Chiquito Other Clinician: Referring Yosgart Pavey: Dierdre Highman Treating Edith Groleau/Extender: Skeet Simmer in Treatment: 0 Primary Learner Assessed: Patient Learning Preferences/Education Level/Primary Language Learning Preference: Explanation Highest Education Level: College or Above Preferred Language: English Cognitive Barrier Assessment/Beliefs Language Barrier: No Translator Needed: No Memory Deficit: No Emotional Barrier: No Cultural/Religious Beliefs Affecting Medical Care: No Physical Barrier Assessment Impaired Vision: No Impaired Hearing: No Decreased Hand dexterity: No Knowledge/Comprehension Assessment Knowledge Level: High Comprehension Level: High Ability to understand written High instructions: Ability to understand verbal High instructions: Motivation Assessment Anxiety Level: Calm Cooperation: Cooperative Education Importance: Acknowledges Need Interest in Health Problems: Asks Questions Perception: Coherent Willingness to Engage in Self- High Management Activities: Readiness to Engage in Self- High Management Activities: Electronic Signature(s) Signed: 05/25/2018 2:41:13 PM By: Arnette Norris Entered By: Arnette Norris on 05/23/2018 10:49:45 Lorimer, Karna Christmas ([id number]) -------------------------------------------------------------------------------- Fall Risk Assessment Details Patient Name: Schiraldi,  [name] Date of Service: 05/23/2018 10:30 AM Medical Record Number: [id number] Patient Account Number: [id number] Date of Birth/Sex: 1926-09-23 (85 y.o. M) Treating RN: Arnette Norris Primary Care Sharai Overbay: Eleonore Chiquito Other Clinician: Referring Hiro Vipond: Durenda Hurt, Rexford Maus Treating Kadarrius Yanke/Extender: Linwood Dibbles, HOYT Weeks in Treatment: 0 Fall Risk Assessment Items Have you had 2 or more falls in the last 12 monthso 0 No Have you had any fall that resulted in injury in the last 12 monthso 0 No FALL RISK ASSESSMENT: History of falling - immediate or within 3 months 0 No Secondary diagnosis 0 No Ambulatory aid None/bed rest/wheelchair/nurse 0 No Crutches/cane/walker  15 Yes Furniture 0 No IV Access/Saline Lock 0 No Gait/Training Normal/bed rest/immobile 0 No Weak 10 Yes Impaired 20 Yes Mental Status Oriented to own ability 0 Yes Electronic Signature(s) Signed: 05/25/2018 2:41:13 PM By: Arnette Norris Entered By: Arnette Norris on 05/23/2018 10:50:22 Willette, Sanders E. ([id number]) -------------------------------------------------------------------------------- Foot Assessment Details Patient Name: Lhommedieu, [name] Date of Service: 05/23/2018 10:30 AM Medical Record Number: [id number] Patient Account Number: [id number] Date of Birth/Sex: November 27, 1926 (85 y.o. M) Treating RN: Arnette Norris Primary Care Elston Aldape: Eleonore Chiquito Other Clinician: Referring Joesph Marcy: Dierdre Highman Treating Leata Dominy/Extender: Linwood Dibbles, HOYT Weeks in Treatment: 0 Foot Assessment Items Site Locations + = Sensation present, - = Sensation absent, C = Callus, U = Ulcer R = Redness, W = Warmth, M = Maceration, PU = Pre-ulcerative lesion F = Fissure, S = Swelling, D = Dryness Assessment Right: Left: Other Deformity: No No Prior Foot Ulcer: No No Prior Amputation: No No Charcot Joint: No No Ambulatory Status: Non-ambulatory Assistance Device: Wheelchair Gait:  Surveyor, mining) Signed: 05/25/2018 2:41:13 PM By: Arnette Norris Entered By: Arnette Norris on 05/23/2018 10:55:03 Fabro, Lavelle E. ([id number]) -------------------------------------------------------------------------------- Nutrition Risk Assessment Details Patient Name: Borges, Cornellius E. Date of Service: 05/23/2018 10:30 AM Medical Record Number: [id number] Patient Account Number: [id number] Date of Birth/Sex: Dec 23, 1926 (85 y.o. M) Treating RN: Arnette Norris Primary Care Isola Mehlman: Eleonore Chiquito Other Clinician: Referring Kalena Mander: Durenda Hurt, Rexford Maus Treating Coleston Dirosa/Extender: Linwood Dibbles, HOYT Weeks in Treatment: 0 Height (in): 67 Weight (lbs): 186 Body Mass Index (BMI): 29.1 Nutrition Risk Assessment Items NUTRITION RISK SCREEN: I have an illness or condition that made me change the kind and/or amount of 0 No food I eat I eat fewer than two meals per day 0 No I eat few fruits and vegetables, or milk products 0 No I have three or more drinks of beer, liquor or wine almost every day 0 No I have tooth or mouth problems that make it hard for me to eat 0 No I don't always have enough money to buy the food I need 0 No I eat alone most of the time 0 No I take three or more different prescribed or over-the-counter drugs a day 1 Yes Without wanting to, I have lost or gained 10 pounds in the last six months 0 No I am not always physically able to shop, cook and/or feed myself 0 No Nutrition Protocols Good Risk Protocol Moderate Risk Protocol Electronic Signature(s) Signed: 05/25/2018 2:41:13 PM By: Arnette Norris Entered By: Arnette Norris on 05/23/2018 10:50:43

## 2018-05-26 NOTE — Progress Notes (Signed)
Aubuchon, DOLAN XIA ([id number]) Visit Report for 05/23/2018 Chief Complaint Document Details Patient Name: [name], [name] Date of Service: 05/23/2018 10:30 AM Medical Record Number: [id number] Patient Account Number: [id number] Date of Birth/Sex: June 28, 1926 (85 y.o. M) Treating RN: Curtis Sites Primary Care Provider: Eleonore Chiquito Other Clinician: Referring Provider: Dierdre Highman Treating Provider/Extender: Linwood Dibbles, HOYT Weeks in Treatment: 0 Information Obtained from: Patient Chief Complaint Sacral pressure ulcer Electronic Signature(s) Signed: 05/24/2018 8:35:34 AM By: Lenda Kelp [name] Entered By: Lenda Kelp on 05/23/2018 17:43:11 Kissling, Dimitris [name] ([id number]) -------------------------------------------------------------------------------- Debridement Details Patient Name: [name], [name] Date of Service: 05/23/2018 10:30 AM Medical Record Number: [id number] Patient Account Number: [id number] Date of Birth/Sex: 15-Jul-1926 (85 y.o. M) Treating RN: Curtis Sites Primary Care Provider: Eleonore Chiquito Other Clinician: Referring Provider: Dierdre Highman Treating Provider/Extender: Linwood Dibbles, HOYT Weeks in Treatment: 0 Debridement Performed for Wound #2 Medial Sacrum Assessment: Performed By: Physician STONE III, HOYT [name], [name] Level of Consciousness (Pre- Awake and Alert procedure): Pre-procedure Verification/Time Yes - 11:35 Out Taken: Start Time: 11:35 Pain Control: Lidocaine 4% Topical Solution Total Area Debrided (L x W): 4 (cm) x 7.2 (cm) = 28.8 (cm) Tissue and other material Viable, Non-Viable, Eschar, Muscle, Slough, Subcutaneous, Slough debrided: Level: Skin/Subcutaneous Tissue/Muscle Debridement Description: Excisional Instrument: Blade, Curette, Forceps, Scissors Bleeding: Minimum Hemostasis Achieved: Pressure End Time: 11:46 Procedural Pain: 0 Post Procedural Pain: 0 Response to Treatment:  Procedure was tolerated well Level of Consciousness Awake and Alert (Post-procedure): Post Debridement Measurements of Total Wound Length: (cm) 4 Stage: Category/Stage IV Width: (cm) 7.2 Depth: (cm) 3.6 Volume: (cm) 81.43 Character of Wound/Ulcer Post Improved Debridement: Post Procedure Diagnosis Same as Pre-procedure Electronic Signature(s) Signed: 05/23/2018 4:56:07 PM By: Curtis Sites Signed: 05/24/2018 8:35:34 AM By: Lenda Kelp [name] Entered By: Curtis Sites on 05/23/2018 11:44:40 Giesler, Narayan [name] ([id number]) -------------------------------------------------------------------------------- HPI Details Patient Name: [name], [name] Date of Service: 05/23/2018 10:30 AM Medical Record Number: [id number] Patient Account Number: [id number] Date of Birth/Sex: 1927-02-25 (85 y.o. M) Treating RN: Curtis Sites Primary Care Provider: Eleonore Chiquito Other Clinician: Referring Provider: Dierdre Highman Treating Provider/Extender: Linwood Dibbles, HOYT Weeks in Treatment: 0 History of Present Illness HPI Description: 04/20/17; this is an 85 year old man who is a bit of an unfortunate story. He was initially admitted to hospital from 9/10 through 9/14 for an elective left total knee replacement secondary to osteoarthritis. On 9/17 a nurse in the hospital noted non-blanchable erythema. The patient was discharged to [facility] skilled facility. An admission note by Dr. Lyn Hollingshead on 01/31/17 noted him unstageable sacral decubitus ulcer. The patient was then admitted to hospital from 9/24 through 10/8 now with a sacral wound felt to be infected. The patient had fever and chills. Required in his surgical IandD. Blood cultures grew Clostridium Ramosium and Bacteroides fragilis. I am not able to see the Bacteroides in care everywhere today. Nevertheless the patient was seen by infectious disease and recommended for 6 weeks of ceftriaxone and Flagyl which should be completed. An x-ray  in the hospital on 9/24 showed peroneal air which may be secondary to a decubitus ulcer no evidence for acute osseous or bone destructive change. An x-ray in the facility on 11/29 showed findings consistent with acute osteomyelitis of the distal coccyx.. Given the history here I suspect that this is probably not all acute and could have resulted at the time of infection the patient has a history of chronic atrial fibrillation on Xarelto, congestive heart  failure with an EF of 40%, hypertension, BPH and osteoarthritis. He has been referred here for consideration of biopsy and culture of exposed coccyx It is difficult to follow the course of this patient's wound. Certainly he could have had underlying osteo in the hospital that wasn't well identified by plane xray and could have had more resistant organisms that were identified. I think we should wait for the results of the bone bx and cx today before initiating any antibiotics. Readmission: 05/23/18 patient presents today for reevaluation here in our clinic although it has been quite some time since he was last here. He is actually a patient of mine which I was seeing at [facility] nursing facility. Subsequently he has a significant stage for pressure ulcer to the gluteal region which coupled with his muscle weakness is causing a significant issue at this point as far as healing is concerned. It appears that this patient likely has an infection as well in the wound bed. He actually arrived today with no dressing in place and there was actually evidence of fecal material in the wound bed. His daughter was present for the evaluation today as well as a CNA from the facility. The patient has had this wound since I first began seeing him on October 2018. I last saw him for evaluation on January 05, 2018. This was that [place] when I was still going to the nursing facilities. He said no on-site wound care from a provider other than that the facility since  that time. He has not been on any recent antibiotics for the wound. The wound was significantly smaller when I last saw him measuring 3.5 x 2.5 x 3. Today this is much larger and deeper. There's also a significant amount of necrotic material which was not present when I last saw him. Electronic Signature(s) Signed: 05/24/2018 8:35:34 AM By: Lenda Kelp [name] Entered By: Lenda Kelp on 05/23/2018 17:44:44 Flanary, Karna Christmas ([id number]) -------------------------------------------------------------------------------- Physical Exam Details Patient Name: Akkerman, [name] Date of Service: 05/23/2018 10:30 AM Medical Record Number: [id number] Patient Account Number: [id number] Date of Birth/Sex: [birth year] (85 y.o. M) Treating RN: Curtis Sites Primary Care Provider: Eleonore Chiquito Other Clinician: Referring Provider: Durenda Hurt, Rexford Maus Treating Provider/Extender: Linwood Dibbles, HOYT Weeks in Treatment: 0 Constitutional supine blood pressure is within target range for patient.. pulse regular and within target range for patient.[name] respirations regular, non-labored and within target range for patient.[name] temperature within target range for patient.. Obese and well-hydrated in no acute distress. Eyes conjunctiva clear no eyelid edema noted. pupils equal round and reactive to light and accommodation. Ears, Nose, Mouth, and Throat no gross abnormality of ear auricles or external auditory canals. normal hearing noted during conversation. mucus membranes moist. Respiratory normal breathing without difficulty. clear to auscultation bilaterally. Cardiovascular regular rate and rhythm with normal S1, S2. no clubbing, cyanosis, significant edema, <3 sec cap refill. Gastrointestinal (GI) soft, non-tender, non-distended, +BS. no ventral hernia noted. Musculoskeletal Patient unable to walk without assistance. Psychiatric this patient is able to make decisions and demonstrates good insight into  disease process. Alert and Oriented x 3. pleasant and cooperative. Notes Upon inspection today patient's wound bed I appears to have significant necrotic material especially at the 12 o'clock location. This did require extensive social debridement today to move the necrotic material. He tolerated this with minimal discomfort when it began to hurt him I would move to a different location to clear away what I could. I did the best I could  obviously with the pain he was experiencing. Nonetheless he seems to be doing fairly well post debridement he has no residual pain at this time and overall I do feel like she's making some progress the chair is cleaning this way in regard to the appearance of the wound. Nonetheless there still erythema and oddly likely infection. I'm gonna place him on empiric antibiotic therapy today. Patient's daughter is in agreement with that plan. Electronic Signature(s) Signed: 05/24/2018 8:35:34 AM By: Lenda Kelp [name] Entered By: Lenda Kelp on 05/23/2018 17:46:04 Eldridge, Karna Christmas ([id number]) -------------------------------------------------------------------------------- Physician Orders Details Patient Name: Trussell, Rylen [name] Date of Service: 05/23/2018 10:30 AM Medical Record Number: [id number] Patient Account Number: [id number] Date of Birth/Sex: 1927/03/04 (85 y.o. M) Treating RN: Curtis Sites Primary Care Provider: Eleonore Chiquito Other Clinician: Referring Provider: Durenda Hurt, Rexford Maus Treating Provider/Extender: Linwood Dibbles, HOYT Weeks in Treatment: 0 Verbal / Phone Orders: No Diagnosis Coding Wound Cleansing Wound #2 Medial Sacrum o Clean wound with Normal Saline. o May shower with protection. Primary Wound Dressing Wound #2 Medial Sacrum o Other: - Dakin's moistened rolled gauze (or Vashe if available in facility) Secondary Dressing Wound #2 Medial Sacrum o ABD pad o XtraSorb - secured with tape Dressing Change Frequency Wound  #2 Medial Sacrum o Change dressing every day. - and as needed for soilage - please assess dressing integrity on every shift Follow-up Appointments Wound #2 Medial Sacrum o Return Appointment in 1 week. Off-Loading Wound #2 Medial Sacrum o Mattress - Please continue with air mattress o Turn and reposition every 2 hours - Please assist patient with repositioning every 2 hours Laboratory o Bacteria identified in Wound by Culture (MICRO) oooo LOINC Code: 6462-6 oooo Convenience Name: Wound culture routine Electronic Signature(s) Signed: 05/23/2018 4:56:07 PM By: Curtis Sites Signed: 05/24/2018 8:35:34 AM By: Lenda Kelp [name] Entered By: Curtis Sites on 05/23/2018 11:51:47 Agresti, Aldin [name] ([id number]) -------------------------------------------------------------------------------- Problem List Details Patient Name: Milroy, Chesney [name] Date of Service: 05/23/2018 10:30 AM Medical Record Number: [id number] Patient Account Number: [id number] Date of Birth/Sex: 01-Jul-1926 (85 y.o. M) Treating RN: Curtis Sites Primary Care Provider: Eleonore Chiquito Other Clinician: Referring Provider: Dierdre Highman Treating Provider/Extender: Linwood Dibbles, HOYT Weeks in Treatment: 0 Active Problems ICD-10 Evaluated Encounter Code Description Active Date Today Diagnosis L89.154 Pressure ulcer of sacral region, stage 4 05/23/2018 No Yes M62.81 Muscle weakness (generalized) 05/23/2018 No Yes Inactive Problems Resolved Problems Electronic Signature(s) Signed: 05/24/2018 8:35:34 AM By: Lenda Kelp [name] Entered By: Lenda Kelp on 05/23/2018 17:42:52 Khader, Wendal [name] ([id number]) -------------------------------------------------------------------------------- Progress Note Details Patient Name: Purewal, [name] Date of Service: 05/23/2018 10:30 AM Medical Record Number: [id number] Patient Account Number: [id number] Date of Birth/Sex: 1927-01-20 (85 y.o. M) Treating RN: Curtis Sites Primary Care Provider: Eleonore Chiquito Other Clinician: Referring Provider: Dierdre Highman Treating Provider/Extender: Linwood Dibbles, HOYT Weeks in Treatment: 0 Subjective Chief Complaint Information obtained from Patient Sacral pressure ulcer History of Present Illness (HPI) 04/20/17; this is an 85 year old man who is a bit of an unfortunate story. He was initially admitted to hospital from 9/10 through 9/14 for an elective left total knee replacement secondary to osteoarthritis. On 9/17 a nurse in the hospital noted non- blanchable erythema. The patient was discharged to [facility] skilled facility. An admission note by Dr. Lyn Hollingshead on 01/31/17 noted him unstageable sacral decubitus ulcer. The patient was then admitted to hospital from 9/24 through 10/8 now with a sacral wound felt to be infected. The patient had  fever and chills. Required in his surgical IandD. Blood cultures grew Clostridium Ramosium and Bacteroides fragilis. I am not able to see the Bacteroides in care everywhere today. Nevertheless the patient was seen by infectious disease and recommended for 6 weeks of ceftriaxone and Flagyl which should be completed. An x-ray in the hospital on 9/24 showed peroneal air which may be secondary to a decubitus ulcer no evidence for acute osseous or bone destructive change. An x-ray in the facility on 11/29 showed findings consistent with acute osteomyelitis of the distal coccyx.. Given the history here I suspect that this is probably not all acute and could have resulted at the time of infection the patient has a history of chronic atrial fibrillation on Xarelto, congestive heart failure with an EF of 40%, hypertension, BPH and osteoarthritis. He has been referred here for consideration of biopsy and culture of exposed coccyx It is difficult to follow the course of this patient's wound. Certainly he could have had underlying osteo in the hospital that wasn't well  identified by plane xray and could have had more resistant organisms that were identified. I think we should wait for the results of the bone bx and cx today before initiating any antibiotics. Readmission: 05/23/18 patient presents today for reevaluation here in our clinic although it has been quite some time since he was last here. He is actually a patient of mine which I was seeing at [facility] nursing facility. Subsequently he has a significant stage for pressure ulcer to the gluteal region which coupled with his muscle weakness is causing a significant issue at this point as far as healing is concerned. It appears that this patient likely has an infection as well in the wound bed. He actually arrived today with no dressing in place and there was actually evidence of fecal material in the wound bed. His daughter was present for the evaluation today as well as a CNA from the facility. The patient has had this wound since I first began seeing him on October 2018. I last saw him for evaluation on January 05, 2018. This was that [place] when I was still going to the nursing facilities. He said no on-site wound care from a provider other than that the facility since that time. He has not been on any recent antibiotics for the wound. The wound was significantly smaller when I last saw him measuring 3.5 x 2.5 x 3. Today this is much larger and deeper. There's also a significant amount of necrotic material which was not present when I last saw him. Wound History Patient presents with 1 open wound that has been present for approximately 02/21/2017. Patient has been treating wound in the following manner: cleaning. Laboratory tests have not been performed in the last month. Patient reportedly has not tested positive for an antibiotic resistant organism. Patient reportedly has not tested positive for osteomyelitis. Patient reportedly has not had testing performed to evaluate circulation in the legs.  Patient experiences the following problems associated with their wounds: swelling. Pettet, Joas [name] ([id number]) Patient History Information obtained from Patient. Allergies NSAIDS (Non-Steroidal Anti-Inflammatory Drug), Vicodin, morphine Family History Heart Disease - Mother,Father, Hypertension - Mother,Father, No family history of Cancer, Diabetes, Hereditary Spherocytosis, Kidney Disease, Lung Disease, Seizures, Stroke, Thyroid Problems, Tuberculosis. Social History Former smoker - quit in 1996, Marital Status - Widowed, Alcohol Use - Never, Drug Use - No History, Caffeine Use - Daily. Medical History Genitourinary Denies history of End Stage Renal Disease Musculoskeletal Denies history of  Osteomyelitis Oncologic Denies history of Received Chemotherapy, Received Radiation Medical And Surgical History Notes Eyes macular degeneration Respiratory continuous O2 Review of Systems (ROS) Eyes Complains or has symptoms of Glasses / Contacts. Hematologic/Lymphatic The patient has no complaints or symptoms. Respiratory The patient has no complaints or symptoms. Cardiovascular Denies complaints or symptoms of Chest pain. Gastrointestinal The patient has no complaints or symptoms. Endocrine The patient has no complaints or symptoms. Genitourinary The patient has no complaints or symptoms. Musculoskeletal Complains or has symptoms of Muscle Pain, Muscle Weakness. Neurologic Denies complaints or symptoms of Numbness/parasthesias, Focal/Weakness. Oncologic The patient has no complaints or symptoms. Moss, Kinan [name] ([id number]) Objective Constitutional supine blood pressure is within target range for patient.. pulse regular and within target range for patient.[name]. respirations regular, non-labored and within target range for patient.[name]. temperature within target range for patient.. Obese and well-hydrated in no acute distress. Vitals Time Taken: 10:29 AM, Height: 67 in, Weight: 186  lbs, Source: Stated, BMI: 29.1, Temperature: 98.2 F, Pulse: 69 bpm, Respiratory Rate: 16 breaths/min, Blood Pressure: 106/46 mmHg. Eyes conjunctiva clear no eyelid edema noted. pupils equal round and reactive to light and accommodation. Ears, Nose, Mouth, and Throat no gross abnormality of ear auricles or external auditory canals. normal hearing noted during conversation. mucus membranes moist. Respiratory normal breathing without difficulty. clear to auscultation bilaterally. Cardiovascular regular rate and rhythm with normal S1, S2. no clubbing, cyanosis, significant edema, Gastrointestinal (GI) soft, non-tender, non-distended, +BS. no ventral hernia noted. Musculoskeletal Patient unable to walk without assistance. Psychiatric this patient is able to make decisions and demonstrates good insight into disease process. Alert and Oriented x 3. pleasant and cooperative. General Notes: Upon inspection today patient's wound bed I appears to have significant necrotic material especially at the 12 o'clock location. This did require extensive social debridement today to move the necrotic material. He tolerated this with minimal discomfort when it began to hurt him I would move to a different location to clear away what I could. I did the best I could obviously with the pain he was experiencing. Nonetheless he seems to be doing fairly well post debridement he has no residual pain at this time and overall I do feel like she's making some progress the chair is cleaning this way in regard to the appearance of the wound. Nonetheless there still erythema and oddly likely infection. I'm gonna place him on empiric antibiotic therapy today. Patient's daughter is in agreement with that plan. Integumentary (Hair, Skin) Wound #2 status is Open. Original cause of wound was Pressure Injury. The wound is located on the Medial Sacrum. The wound measures 4cm length x 7.2cm width x 3.3cm depth; 22.619cm^2 area  and 74.644cm^3 volume. There is muscle, Fat Layer (Subcutaneous Tissue) Exposed, and fascia exposed. There is undermining starting at 3:00 and ending at 5:00 with a maximum distance of 4.8cm. There is a large amount of purulent drainage noted. Foul odor after cleansing was noted. The wound margin is epibole. There is no granulation within the wound bed. There is a large (67-100%) amount of necrotic tissue within the wound bed including Eschar and Adherent Slough. The periwound skin appearance exhibited: Dry/Scaly, Erythema. The periwound skin appearance did not exhibit: Callus, Crepitus, Excoriation, Induration, Rash, Scarring, Maceration, Atrophie Blanche, Cyanosis, Ecchymosis, Hemosiderin Staining, Mottled, Pallor, Rubor. The surrounding wound skin color is noted with erythema with red streaks. Periwound temperature was noted as No Abnormality. The periwound has tenderness on palpation. Storie, Welford [name] ([id number]) Assessment Active Problems ICD-10 Pressure ulcer  of sacral region, stage 4 Muscle weakness (generalized) Procedures Wound #2 Pre-procedure diagnosis of Wound #2 is a Pressure Ulcer located on the Medial Sacrum . There was a Excisional Skin/Subcutaneous Tissue/Muscle Debridement with a total area of 28.8 sq cm performed by STONE III, HOYT [name], [name]. With the following instrument(s): Blade, Curette, Forceps, and Scissors to remove Viable and Non-Viable tissue/material. Material removed includes Muscle, Eschar, Subcutaneous Tissue, and Slough after achieving pain control using Lidocaine 4% Topical Solution. No specimens were taken. A time out was conducted at 11:35, prior to the start of the procedure. A Minimum amount of bleeding was controlled with Pressure. The procedure was tolerated well with a pain level of 0 throughout and a pain level of 0 following the procedure. Post Debridement Measurements: 4cm length x 7.2cm width x 3.6cm depth; 81.43cm^3 volume. Post debridement Stage  noted as Category/Stage IV. Character of Wound/Ulcer Post Debridement is improved. Post procedure Diagnosis Wound #2: Same as Pre-Procedure Plan Wound Cleansing: Wound #2 Medial Sacrum: Clean wound with Normal Saline. May shower with protection. Primary Wound Dressing: Wound #2 Medial Sacrum: Other: - Dakin's moistened rolled gauze (or Vashe if available in facility) Secondary Dressing: Wound #2 Medial Sacrum: ABD pad XtraSorb - secured with tape Dressing Change Frequency: Wound #2 Medial Sacrum: Change dressing every day. - and as needed for soilage - please assess dressing integrity on every shift Follow-up Appointments: Wound #2 Medial Sacrum: Return Appointment in 1 week. Off-Loading: Wound #2 Medial Sacrum: Mattress - Please continue with air mattress Turn and reposition every 2 hours - Please assist patient with repositioning every 2 hours Laboratory ordered were: Wound culture routine Patchin, Ronith [name] ([id number]) At this point I'm gonna recommend that we initiate the above wound care measures for the next week. We will subsequently see were things stand at follow-up. If anything changes or worsens in the meantime the patient will contact the office and let us know. Subsequently if there any cut questions or concerns that they need to know from the nursing facility at [facility] there definitely more than able to contact us in that regard. Otherwise we will see were things stand at follow-up. Please see above for specific wound care orders. We will see patient for re-evaluation in 1 week(s) here in the clinic. If anything worsens or changes patient will contact our office for additional recommendations. Electronic Signature(s) Signed: 05/24/2018 8:35:34 AM By: Lenda Kelp [name] Entered By: Lenda Kelp on 05/24/2018 08:34:54 Magnan, Karna Christmas ([id number]) -------------------------------------------------------------------------------- ROS/PFSH Details Patient Name:  Weiskopf, [name] Date of Service: 05/23/2018 10:30 AM Medical Record Number: [id number] Patient Account Number: [id number] Date of Birth/Sex: [birth year] (85 y.o. M) Treating RN: Arnette Norris Primary Care Provider: Eleonore Chiquito Other Clinician: Referring Provider: Dierdre Highman Treating Provider/Extender: Linwood Dibbles, HOYT Weeks in Treatment: 0 Information Obtained From Patient Wound History Do you currently have one or more open woundso Yes How many open wounds do you currently haveo 1 Approximately how long have you had your woundso 02/21/2017 How have you been treating your wound(s) until nowo cleaning Has your wound(s) ever healed and then re-openedo No Have you had any lab work done in the past montho No Have you tested positive for an antibiotic resistant organism (MRSA, VRE)o No Have you tested positive for osteomyelitis (bone infection)o No Have you had any tests for circulation on your legso No Have you had other problems associated with your woundso Swelling Eyes Complaints and Symptoms: Positive for: Glasses / Contacts Medical  History: Negative for: Cataracts; Glaucoma; Optic Neuritis Past Medical History Notes: macular degeneration Cardiovascular Complaints and Symptoms: Negative for: Chest pain Medical History: Positive for: Congestive Heart Failure; Hypertension Musculoskeletal Complaints and Symptoms: Positive for: Muscle Pain; Muscle Weakness Medical History: Positive for: Osteoarthritis Negative for: Osteomyelitis Neurologic Complaints and Symptoms: Negative for: Numbness/parasthesias; Focal/Weakness Hematologic/Lymphatic Bruening, Alphonse [name] ([id number]) Complaints and Symptoms: No Complaints or Symptoms Medical History: Negative for: Anemia; Hemophilia; Human Immunodeficiency Virus; Lymphedema; Sickle Cell Disease Respiratory Complaints and Symptoms: No Complaints or Symptoms Medical History: Positive for: Chronic Obstructive Pulmonary  Disease (COPD) Past Medical History Notes: continuous O2 Gastrointestinal Complaints and Symptoms: No Complaints or Symptoms Endocrine Complaints and Symptoms: No Complaints or Symptoms Genitourinary Complaints and Symptoms: No Complaints or Symptoms Medical History: Negative for: End Stage Renal Disease Oncologic Complaints and Symptoms: No Complaints or Symptoms Medical History: Negative for: Received Chemotherapy; Received Radiation Immunizations Pneumococcal Vaccine: Received Pneumococcal Vaccination: Yes Immunization Notes: up to date Implantable Devices Family and Social History Cancer: No; Diabetes: No; Heart Disease: Yes - Mother,Father; Hereditary Spherocytosis: No; Hypertension: Yes - Mother,Father; Kidney Disease: No; Lung Disease: No; Seizures: No; Stroke: No; Thyroid Problems: No; Tuberculosis: No; Former smoker - quit in 1996; Marital Status - Widowed; Alcohol Use: Never; Drug Use: No History; Caffeine Use: Daily; Financial Concerns: No; Food, Clothing or Shelter Needs: No; Support System Lacking: No; Transportation Concerns: No; Advanced Directives: No; Patient does not want information on Advanced Directives Electronic Signature(s) Clippinger, ANJEL GRAUL ([id number]) Signed: 05/24/2018 8:35:34 AM By: Lenda Kelp [name] Signed: 05/25/2018 2:41:13 PM By: Arnette Norris Entered By: Arnette Norris on 05/23/2018 10:48:22 Swigert, Diondre [name] ([id number]) -------------------------------------------------------------------------------- SuperBill Details Patient Name: Vanwingerden, [name] Date of Service: 05/23/2018 Medical Record Number: [id number] Patient Account Number: [id number] Date of Birth/Sex: [birth year] (85 y.o. M) Treating RN: Curtis Sites Primary Care Provider: Eleonore Chiquito Other Clinician: Referring Provider: Durenda Hurt, Rexford Maus Treating Provider/Extender: Linwood Dibbles, HOYT Weeks in Treatment: 0 Diagnosis Coding ICD-10 Codes Code Description L89.154  Pressure ulcer of sacral region, stage 4 M62.81 Muscle weakness (generalized) Facility Procedures CPT4 Code: 27035009 Description: 99213 - WOUND CARE VISIT-LEV 3 EST PT Modifier: Quantity: 1 CPT4 Code: 38182993 Description: 11043 - DEB MUSC/FASCIA 20 SQ CM/< ICD-10 Diagnosis Description L89.154 Pressure ulcer of sacral region, stage 4 Modifier: Quantity: 1 CPT4 Code: [id number] Description: 11046 - DEB MUSC/FASCIA EA ADDL 20 CM ICD-10 Diagnosis Description L89.154 Pressure ulcer of sacral region, stage 4 Modifier: Quantity: 1 Physician Procedures CPT4 Code: 3810175 Description: 99214 - WC PHYS LEVEL 4 - EST PT ICD-10 Diagnosis Description L89.154 Pressure ulcer of sacral region, stage 4 M62.81 Muscle weakness (generalized) Modifier: 25 Quantity: 1 CPT4 Code: [id number] Description: 11043 - WC PHYS DEBR MUSCLE/FASCIA 20 SQ CM ICD-10 Diagnosis Description L89.154 Pressure ulcer of sacral region, stage 4 Modifier: Quantity: 1 CPT4 Code: [id number] Description: 11046 - WC PHYS DEB MUSC/FASC EA ADDL 20 CM ICD-10 Diagnosis Description L89.154 Pressure ulcer of sacral region, stage 4 Modifier: Quantity: 1 Electronic Signature(s) Signed: 05/24/2018 8:35:34 AM By: Lenda Kelp [name] Entered By: Lenda Kelp on 05/24/2018 08:35:09

## 2018-05-30 ENCOUNTER — Encounter: Payer: Medicare Other | Admitting: Physician Assistant

## 2018-05-30 DIAGNOSIS — L89154 Pressure ulcer of sacral region, stage 4: Secondary | ICD-10-CM | POA: Diagnosis not present

## 2018-05-31 NOTE — Progress Notes (Signed)
[name], [name] ([id number]) Visit Report for 05/30/2018 Chief Complaint Document Details Patient Name: [name], [name] Date of Service: 05/30/2018 11:00 AM Medical Record Number: [id number] Patient Account Number: [id number] Date of Birth/Sex: [birth year] (85 y.o. Male) Treating RN: Curtis Sites Primary Care Provider: Eleonore Chiquito Other Clinician: Referring Provider: Eleonore Chiquito Treating Provider/Extender: Linwood Dibbles, HOYT Weeks in Treatment: 1 Information Obtained from: Patient Chief Complaint Sacral pressure ulcer Electronic Signature(s) Signed: 05/30/2018 5:17:02 PM By: Lenda Kelp [name] Entered By: Lenda Kelp on 05/30/2018 11:09:33 [name], [name] ([id number]) -------------------------------------------------------------------------------- Debridement Details Patient Name: [name], [name] Date of Service: 05/30/2018 11:00 AM Medical Record Number: [id number] Patient Account Number: [id number] Date of Birth/Sex: March 09, 1927 (85 y.o. Male) Treating RN: Curtis Sites Primary Care Provider: Eleonore Chiquito Other Clinician: Referring Provider: Eleonore Chiquito Treating Provider/Extender: Linwood Dibbles, HOYT Weeks in Treatment: 1 Debridement Performed for Wound #2 Medial Sacrum Assessment: Performed By: Physician STONE III, HOYT [name], [name] Level of Consciousness (Pre- Awake and Alert procedure): Pre-procedure Verification/Time Yes - 11:26 Out Taken: Start Time: 11:26 Pain Control: Lidocaine 4% Topical Solution Total Area Debrided (L x W): 1 (cm) x 1 (cm) = 1 (cm) Tissue and other material Viable, Non-Viable, Bone, Slough, Subcutaneous, Slough debrided: Level: Skin/Subcutaneous Tissue/Muscle/Bone Debridement Description: Excisional Instrument: Curette Bleeding: Minimum Hemostasis Achieved: Pressure End Time: 11:29 Procedural Pain: 0 Post Procedural Pain: 0 Response to Treatment: Procedure was tolerated well Level of  Consciousness Awake and Alert (Post-procedure): Post Debridement Measurements of Total Wound Length: (cm) 4 Stage: Category/Stage IV Width: (cm) 7.2 Depth: (cm) 3.8 Volume: (cm) 85.954 Character of Wound/Ulcer Post Improved Debridement: Post Procedure Diagnosis Same as Pre-procedure Electronic Signature(s) Signed: 05/30/2018 5:17:02 PM By: Lenda Kelp [name] Signed: 05/30/2018 5:22:36 PM By: Curtis Sites Entered By: Curtis Sites on 05/30/2018 11:29:18 [name], [name] ([id number]) -------------------------------------------------------------------------------- HPI Details Patient Name: [name], [name] Date of Service: 05/30/2018 11:00 AM Medical Record Number: [id number] Patient Account Number: [id number] Date of Birth/Sex: [birth year] (85 y.o. Male) Treating RN: Curtis Sites Primary Care Provider: Eleonore Chiquito Other Clinician: Referring Provider: Eleonore Chiquito Treating Provider/Extender: Linwood Dibbles, HOYT Weeks in Treatment: 1 History of Present Illness HPI Description: 04/20/17; this is an 85 year old man who is a bit of an unfortunate story. He was initially admitted to hospital from 9/10 through 9/14 for an elective left total knee replacement secondary to osteoarthritis. On 9/17 a nurse in the hospital noted non-blanchable erythema. The patient was discharged to [facility] skilled facility. An admission note by Dr. Lyn Hollingshead on 01/31/17 noted him unstageable sacral decubitus ulcer. The patient was then admitted to hospital from 9/24 through 10/8 now with a sacral wound felt to be infected. The patient had fever and chills. Required in his surgical IandD. Blood cultures grew Clostridium Ramosium and Bacteroides fragilis. I am not able to see the Bacteroides in care everywhere today. Nevertheless the patient was seen by infectious disease and recommended for 6 weeks of ceftriaxone and Flagyl which should be completed. An x-ray in the hospital on 9/24 showed  peroneal air which may be secondary to a decubitus ulcer no evidence for acute osseous or bone destructive change. An x-ray in the facility on 11/29 showed findings consistent with acute osteomyelitis of the distal coccyx.. Given the history here I suspect that this is probably not all acute and could have resulted at the time of infection the patient has a history of chronic atrial fibrillation on Xarelto, congestive heart failure with an EF of 40%, hypertension,  BPH and osteoarthritis. He has been referred here for consideration of biopsy and culture of exposed coccyx It is difficult to follow the course of this patient's wound. Certainly he could have had underlying osteo in the hospital that wasn't well identified by plane xray and could have had more resistant organisms that were identified. I think we should wait for the results of the bone bx and cx today before initiating any antibiotics. Readmission: 05/23/18 patient presents today for reevaluation here in our clinic although it has been quite some time since he was last here. He is actually a patient of mine which I was seeing at [facility] nursing facility. Subsequently he has a significant stage for pressure ulcer to the gluteal region which coupled with his muscle weakness is causing a significant issue at this point as far as healing is concerned. It appears that this patient likely has an infection as well in the wound bed. He actually arrived today with no dressing in place and there was actually evidence of fecal material in the wound bed. His daughter was present for the evaluation today as well as a CNA from the facility. The patient has had this wound since I first began seeing him on October 2018. I last saw him for evaluation on January 05, 2018. This was that [place] when I was still going to the nursing facilities. He said no on-site wound care from a provider other than that the facility since that time. He has not been  on any recent antibiotics for the wound. The wound was significantly smaller when I last saw him measuring 3.5 x 2.5 x 3. Today this is much larger and deeper. There's also a significant amount of necrotic material which was not present when I last saw him. 05/30/18 on evaluation today patient appears to be doing excellent in regard to his sacral wound especially as compared to my last evaluation with him. He has been tolerating the dressing changes without complication and though there is some Slough noted there is really no significant bone noted exposed in the wound bed other than a small piece that is working itself out within some granulation tissue. Overall I feel like he's made excellent progress this wound appears to be night and day compared to what it looks like last week. Electronic Signature(s) Signed: 05/30/2018 5:17:02 PM By: Lenda Kelp [name] Entered By: Lenda Kelp on 05/30/2018 16:59:36 Morsch, [name] ([id number]) -------------------------------------------------------------------------------- Physical Exam Details Patient Name: [name], [name] Date of Service: 05/30/2018 11:00 AM Medical Record Number: [id number] Patient Account Number: [id number] Date of Birth/Sex: 02/22/1927 (85 y.o. Male) Treating RN: Curtis Sites Primary Care Provider: Eleonore Chiquito Other Clinician: Referring Provider: Eleonore Chiquito Treating Provider/Extender: STONE III, HOYT Weeks in Treatment: 1 Constitutional Well-nourished and well-hydrated in no acute distress. Respiratory normal breathing without difficulty. clear to auscultation bilaterally. Cardiovascular regular rate and rhythm with normal S1, S2. Psychiatric this patient is able to make decisions and demonstrates good insight into disease process. Alert and Oriented x 3. pleasant and cooperative. Notes At this point patient's wound does still have some Slough noted on the surface although this seems to be cleared away  very well with the Dakinos solution and/or Vashe that has been used at the facility. With that being said I do believe this is appropriate to continue. Sharp debridement was performed to remove a piece of necrotic bone working itself out in the granulation tissue I do not believe that this is anything that we  need to send for pathology at this point although things do not continue to improve that may be something to consider in the future. Electronic Signature(s) Signed: 05/30/2018 5:17:02 PM By: Lenda Kelp [name] Entered By: Lenda Kelp on 05/30/2018 17:00:31 Vandeven, [name] ([id number]) -------------------------------------------------------------------------------- Physician Orders Details Patient Name: Mailhot, Hermen [name] Date of Service: 05/30/2018 11:00 AM Medical Record Number: [id number] Patient Account Number: [id number] Date of Birth/Sex: Oct 13, 1926 (85 y.o. Male) Treating RN: Curtis Sites Primary Care Provider: Eleonore Chiquito Other Clinician: Referring Provider: Eleonore Chiquito Treating Provider/Extender: Linwood Dibbles, HOYT Weeks in Treatment: 1 Verbal / Phone Orders: No Diagnosis Coding ICD-10 Coding Code Description L89.154 Pressure ulcer of sacral region, stage 4 M62.81 Muscle weakness (generalized) Wound Cleansing Wound #2 Medial Sacrum o Clean wound with Normal Saline. o May shower with protection. Primary Wound Dressing Wound #2 Medial Sacrum o Other: - Dakin's moistened rolled gauze (or Vashe if available in facility) Secondary Dressing Wound #2 Medial Sacrum o ABD pad o XtraSorb - secured with tape Dressing Change Frequency Wound #2 Medial Sacrum o Change dressing every day. - and as needed for soilage - please assess dressing integrity on every shift Follow-up Appointments Wound #2 Medial Sacrum o Return Appointment in 2 weeks. Off-Loading Wound #2 Medial Sacrum o Mattress - Please continue with air mattress o Turn and  reposition every 2 hours - Please assist patient with repositioning every 2 hours o Other: - Patient may sit on the side of his bed for meals Electronic Signature(s) Signed: 05/30/2018 5:17:02 PM By: Lenda Kelp [name] Signed: 05/30/2018 5:22:36 PM By: Curtis Sites Entered By: Curtis Sites on 05/30/2018 11:37:48 [name], [name] ([id number]) -------------------------------------------------------------------------------- Problem List Details Patient Name: [name], [name] Date of Service: 05/30/2018 11:00 AM Medical Record Number: [id number] Patient Account Number: [id number] Date of Birth/Sex: 12-31-1926 (85 y.o. Male) Treating RN: Curtis Sites Primary Care Provider: Eleonore Chiquito Other Clinician: Referring Provider: Eleonore Chiquito Treating Provider/Extender: Linwood Dibbles, HOYT Weeks in Treatment: 1 Active Problems ICD-10 Evaluated Encounter Code Description Active Date Today Diagnosis L89.154 Pressure ulcer of sacral region, stage 4 05/23/2018 No Yes M62.81 Muscle weakness (generalized) 05/23/2018 No Yes Inactive Problems Resolved Problems Electronic Signature(s) Signed: 05/30/2018 5:17:02 PM By: Lenda Kelp [name] Entered By: Lenda Kelp on 05/30/2018 11:09:28 [name], [name] ([id number]) -------------------------------------------------------------------------------- Progress Note Details Patient Name: [name], [name] Date of Service: 05/30/2018 11:00 AM Medical Record Number: [id number] Patient Account Number: [id number] Date of Birth/Sex: 1927-02-12 (85 y.o. Male) Treating RN: Curtis Sites Primary Care Provider: Eleonore Chiquito Other Clinician: Referring Provider: Eleonore Chiquito Treating Provider/Extender: Linwood Dibbles, HOYT Weeks in Treatment: 1 Subjective Chief Complaint Information obtained from Patient Sacral pressure ulcer History of Present Illness (HPI) 04/20/17; this is an 85 year old man who is a bit of an unfortunate story. He was  initially admitted to hospital from 9/10 through 9/14 for an elective left total knee replacement secondary to osteoarthritis. On 9/17 a nurse in the hospital noted non- blanchable erythema. The patient was discharged to [facility] skilled facility. An admission note by Dr. Lyn Hollingshead on 01/31/17 noted him unstageable sacral decubitus ulcer. The patient was then admitted to hospital from 9/24 through 10/8 now with a sacral wound felt to be infected. The patient had fever and chills. Required in his surgical IandD. Blood cultures grew Clostridium Ramosium and Bacteroides fragilis. I am not able to see the Bacteroides in care everywhere today. Nevertheless the patient was seen by infectious disease and recommended for 6 weeks of  ceftriaxone and Flagyl which should be completed. An x-ray in the hospital on 9/24 showed peroneal air which may be secondary to a decubitus ulcer no evidence for acute osseous or bone destructive change. An x-ray in the facility on 11/29 showed findings consistent with acute osteomyelitis of the distal coccyx.. Given the history here I suspect that this is probably not all acute and could have resulted at the time of infection the patient has a history of chronic atrial fibrillation on Xarelto, congestive heart failure with an EF of 40%, hypertension, BPH and osteoarthritis. He has been referred here for consideration of biopsy and culture of exposed coccyx It is difficult to follow the course of this patient's wound. Certainly he could have had underlying osteo in the hospital that wasn't well identified by plane xray and could have had more resistant organisms that were identified. I think we should wait for the results of the bone bx and cx today before initiating any antibiotics. Readmission: 05/23/18 patient presents today for reevaluation here in our clinic although it has been quite some time since he was last here. He is actually a patient of mine which [facility]. Subsequently he has a significant stage for pressure ulcer to the gluteal region which coupled with his muscle weakness is causing a significant issue at this point as far as healing is concerned. It appears that this patient likely has an infection as well in the wound bed. He actually arrived today with no dressing in place and there was actually evidence of fecal material in the wound bed. His daughter was present for the evaluation today as well as a CNA from the facility. The patient has had this wound since I first began seeing him on October 2018. I last saw him for evaluation on January 05, 2018. [place] was still going to the nursing facilities. He said no on-site wound care from a provider other than that the facility since that time. He has not been on any recent antibiotics for the wound. The wound was significantly smaller when I last saw him measuring 3.5 x 2.5 x 3. Today this is much larger and deeper. There's also a significant amount of necrotic material which was not present when I last saw him. 05/30/18 on evaluation today patient appears to be doing excellent in regard to his sacral wound especially as compared to my last evaluation with him. He has been tolerating the dressing changes without complication and though there is some Slough noted there is really no significant bone noted exposed in the wound bed other than a small piece that is working itself out within some granulation tissue. Overall I feel like he's made excellent progress this wound appears to be night and day compared to what it looks like last week. Patient History Mottley, [name] ChristmasVERETT [name] ([id number]) Information obtained from Patient. Family History Heart Disease - Mother,Father, Hypertension - Mother,Father, No family history of Cancer, Diabetes, Hereditary Spherocytosis, Kidney Disease, Lung Disease, Seizures, Stroke, Thyroid Problems, Tuberculosis. Social  History Former smoker - quit in 1996, Marital Status - Widowed, Alcohol Use - Never, Drug Use - No History, Caffeine Use - Daily. Medical And Surgical History Notes Eyes macular degeneration Respiratory continuous O2 Review of Systems (ROS) Constitutional Symptoms (General Health) Denies complaints or symptoms of Fever, Chills. Respiratory The patient has no complaints or symptoms. Cardiovascular The patient has no complaints or symptoms. Objective Constitutional Well-nourished and well-hydrated in no  acute distress. Vitals Time Taken: 10:58 AM, Height: 67 in, Weight: 186 lbs, BMI: 29.1, Temperature: 98.2 F, Pulse: 75 bpm, Respiratory Rate: 16 breaths/min, Blood Pressure: 97/48 mmHg. Respiratory normal breathing without difficulty. clear to auscultation bilaterally. Cardiovascular regular rate and rhythm with normal S1, S2. Psychiatric this patient is able to make decisions and demonstrates good insight into disease process. Alert and Oriented x 3. pleasant and cooperative. General Notes: At this point patient's wound does still have some Slough noted on the surface although this seems to be cleared away very well with the [place] s solution and/or Vashe that has been used at the facility. With that being said I do believe this is appropriate to continue. Sharp debridement was performed to remove a piece of necrotic bone working itself out in the granulation tissue I do not believe that this is anything that we need to send for pathology at this point although things do not continue to improve that may be something to consider in the future. Integumentary (Hair, Skin) [name], [name] ([id number]) Wound #2 status is Open. Original cause of wound was Pressure Injury. The wound is located on the Medial Sacrum. The wound measures 4cm length x 7.2cm width x 3.6cm depth; 22.619cm^2 area and 81.43cm^3 volume. There is muscle, Fat Layer (Subcutaneous Tissue) Exposed, and fascia exposed.  There is no tunneling noted, however, there is undermining starting at 3:00 and ending at 5:00 with a maximum distance of 4.2cm. There is a large amount of purulent drainage noted. Foul odor after cleansing was noted. The wound margin is epibole. There is no granulation within the wound bed. There is a large (67-100%) amount of necrotic tissue within the wound bed including Adherent Slough. The periwound skin appearance exhibited: Dry/Scaly, Erythema. The periwound skin appearance did not exhibit: Callus, Crepitus, Excoriation, Induration, Rash, Scarring, Maceration, Atrophie Blanche, Cyanosis, Ecchymosis, Hemosiderin Staining, Mottled, Pallor, Rubor. The surrounding wound skin color is noted with erythema with red streaks. Periwound temperature was noted as No Abnormality. The periwound has tenderness on palpation. Assessment Active Problems ICD-10 Pressure ulcer of sacral region, stage 4 Muscle weakness (generalized) Procedures Wound #2 Pre-procedure diagnosis of Wound #2 is a Pressure Ulcer located on the Medial Sacrum . There was a Excisional Skin/Subcutaneous Tissue/Muscle/Bone Debridement with a total area of 1 sq cm performed by STONE III, HOYT [name], [name]. With the following instrument(s): Curette to remove Viable and Non-Viable tissue/material. Material removed includes Bone,Subcutaneous Tissue, and Slough after achieving pain control using Lidocaine 4% Topical Solution. No specimens were taken. A time out was conducted at 11:26, prior to the start of the procedure. A Minimum amount of bleeding was controlled with Pressure. The procedure was tolerated well with a pain level of 0 throughout and a pain level of 0 following the procedure. Post Debridement Measurements: 4cm length x 7.2cm width x 3.8cm depth; 85.954cm^3 volume. Post debridement Stage noted as Category/Stage IV. Character of Wound/Ulcer Post Debridement is improved. Post procedure Diagnosis Wound #2: Same as  Pre-Procedure Plan Wound Cleansing: Wound #2 Medial Sacrum: Clean wound with Normal Saline. May shower with protection. Primary Wound Dressing: Wound #2 Medial Sacrum: Other: - Dakin's moistened rolled gauze (or Vashe if available in facility) Secondary Dressing: Wound #2 Medial Sacrum: ABD pad Mullins, [name] ([id number]) XtraSorb - secured with tape Dressing Change Frequency: Wound #2 Medial Sacrum: Change dressing every day. - and as needed for soilage - please assess dressing integrity on every shift Follow-up Appointments: Wound #2 Medial Sacrum: Return Appointment in  2 weeks. Off-Loading: Wound #2 Medial Sacrum: Mattress - Please continue with air mattress Turn and reposition every 2 hours - Please assist patient with repositioning every 2 hours Other: - Patient may sit on the side of his bed for meals My suggestion currently is going to be that we continue with the above wound care measures for the next week. The patient is in agreement the plan. We will subsequently see were things stand at follow-up. I did discuss all this with the patient's daughter by way of telephone that she could not be here today as well. Please see above for specific wound care orders. We will see patient for re-evaluation in 2 week(s) here in the clinic. If anything worsens or changes patient will contact our office for additional recommendations. Electronic Signature(s) Signed: 05/30/2018 5:17:02 PM By: Lenda Kelp [name] Entered By: Lenda Kelp on 05/30/2018 17:00:56 Vandeven, [name] ([id number]) -------------------------------------------------------------------------------- ROS/PFSH Details Patient Name: Totton, Natale [name] Date of Service: 05/30/2018 11:00 AM Medical Record Number: [id number] Patient Account Number: [id number] Date of Birth/Sex: 02-Nov-1926 (85 y.o. Male) Treating RN: Curtis Sites Primary Care Provider: Eleonore Chiquito Other Clinician: Referring Provider:  Eleonore Chiquito Treating Provider/Extender: Linwood Dibbles, HOYT Weeks in Treatment: 1 Information Obtained From Patient Wound History Do you currently have one or more open woundso Yes How many open wounds do you currently haveo 1 Approximately how long have you had your woundso 02/21/2017 How have you been treating your wound(s) until nowo cleaning Has your wound(s) ever healed and then re-openedo No Have you had any lab work done in the past montho No Have you tested positive for an antibiotic resistant organism (MRSA, VRE)o No Have you tested positive for osteomyelitis (bone infection)o No Have you had any tests for circulation on your legso No Have you had other problems associated with your woundso Swelling Constitutional Symptoms (General Health) Complaints and Symptoms: Negative for: Fever; Chills Eyes Medical History: Negative for: Cataracts; Glaucoma; Optic Neuritis Past Medical History Notes: macular degeneration Hematologic/Lymphatic Medical History: Negative for: Anemia; Hemophilia; Human Immunodeficiency Virus; Lymphedema; Sickle Cell Disease Respiratory Complaints and Symptoms: No Complaints or Symptoms Medical History: Positive for: Chronic Obstructive Pulmonary Disease (COPD) Past Medical History Notes: continuous O2 Cardiovascular Complaints and Symptoms: No Complaints or Symptoms Medical History: Positive for: Congestive Heart Failure; Hypertension Tudisco, Stein [name] ([id number]) Genitourinary Medical History: Negative for: End Stage Renal Disease Musculoskeletal Medical History: Positive for: Osteoarthritis Negative for: Osteomyelitis Oncologic Medical History: Negative for: Received Chemotherapy; Received Radiation Immunizations Pneumococcal Vaccine: Received Pneumococcal Vaccination: Yes Immunization Notes: up to date Implantable Devices Family and Social History Cancer: No; Diabetes: No; Heart Disease: Yes - Mother,Father; Hereditary  Spherocytosis: No; Hypertension: Yes - Mother,Father; Kidney Disease: No; Lung Disease: No; Seizures: No; Stroke: No; Thyroid Problems: No; Tuberculosis: No; Former smoker - quit in 1996; Marital Status - Widowed; Alcohol Use: Never; Drug Use: No History; Caffeine Use: Daily; Financial Concerns: No; Food, Clothing or Shelter Needs: No; Support System Lacking: No; Transportation Concerns: No; Advanced Directives: No; Patient does not want information on Advanced Directives Physician Affirmation I have reviewed and agree with the above information. Electronic Signature(s) Signed: 05/30/2018 5:17:02 PM By: Lenda Kelp [name] Signed: 05/30/2018 5:22:36 PM By: Curtis Sites Entered By: Lenda Kelp on 05/30/2018 17:00:08 Kingbird, Hristopher [name] ([id number]) -------------------------------------------------------------------------------- SuperBill Details Patient Name: Lejeune, Shubham [name] Date of Service: 05/30/2018 Medical Record Number: [id number] Patient Account Number: [id number] Date of Birth/Sex: [birth year] (85 y.o. Male) Treating RN: Curtis Sites Primary Care Provider:  Eleonore ChiquitoKWIATKOWSKI, PETER Other Clinician: Referring Provider: Eleonore ChiquitoKWIATKOWSKI, PETER Treating Provider/Extender: Linwood DibblesSTONE III, HOYT Weeks in Treatment: 1 Diagnosis Coding ICD-10 Codes Code Description L89.154 Pressure ulcer of sacral region, stage 4 M62.81 Muscle weakness (generalized) Facility Procedures CPT4 Code: 9604540976100138 Description: 99213 - WOUND CARE VISIT-LEV 3 EST PT Modifier: Quantity: 1 CPT4 Code: 8119147836100016 Description: 11044 - DEB BONE 20 SQ CM/< ICD-10 Diagnosis Description L89.154 Pressure ulcer of sacral region, stage 4 Modifier: Quantity: 1 Physician Procedures CPT4: Description Modifier Quantity Code [id number] Debridement; bone (includes epidermis, dermis, subQ tissue, muscle and/or fascia, if 1 performed) 1st 20 sqcm or less ICD-10 Diagnosis Description L89.154 Pressure ulcer of sacral region, stage 4 Electronic  Signature(s) Signed: 05/30/2018 5:17:02 PM By: Lenda KelpStone III, Hoyt [name] Entered By: Lenda KelpStone III, Hoyt on 05/30/2018 17:01:07

## 2018-06-02 NOTE — Progress Notes (Signed)
Maclaughlin, JEEVAN SCHUTH ([id number]) Visit Report for 05/30/2018 Arrival Information Details Patient Name: [name], [name] Date of Service: 05/30/2018 11:00 AM Medical Record Number: [id number] Patient Account Number: [id number] Date of Birth/Sex: November 29, 1926 (85 y.o. Male) Treating RN: Curtis Sites Primary Care Cheria Sadiq: Eleonore Chiquito Other Clinician: Referring Audree Schrecengost: Eleonore Chiquito Treating Janaysha Depaulo/Extender: Linwood Dibbles, HOYT Weeks in Treatment: 1 Visit Information History Since Last Visit Added or deleted any medications: No Patient Arrived: Wheel Chair Any new allergies or adverse reactions: No Arrival Time: 10:58 Had a fall or experienced change in No Accompanied By: caregiver activities of daily living that may affect Transfer Assistance: Michiel Sites Lift risk of falls: Patient Identification Verified: Yes Signs or symptoms of abuse/neglect since last visito No Secondary Verification Process Completed: Yes Hospitalized since last visit: No Implantable device outside of the clinic excluding No cellular tissue based products placed in the center since last visit: Has Dressing in Place as Prescribed: Yes Pain Present Now: No Electronic Signature(s) Signed: 05/30/2018 1:47:13 PM By: Dayton Martes RCP, RRT, CHT Entered By: Dayton Martes on 05/30/2018 10:58:37 [name], [name] ([id number]) -------------------------------------------------------------------------------- Clinic Level of Care Assessment Details Patient Name: [name], [name] Date of Service: 05/30/2018 11:00 AM Medical Record Number: [id number] Patient Account Number: [id number] Date of Birth/Sex: Sep 03, 1926 (85 y.o. Male) Treating RN: Curtis Sites Primary Care Ebb Carelock: Eleonore Chiquito Other Clinician: Referring Mathews Stuhr: Eleonore Chiquito Treating Fareed Fung/Extender: Linwood Dibbles, HOYT Weeks in Treatment: 1 Clinic Level of Care Assessment Items TOOL 4 Quantity Score []  - Use when  only an EandM is performed on FOLLOW-UP visit 0 ASSESSMENTS - Nursing Assessment / Reassessment X - Reassessment of Co-morbidities (includes updates in patient status) 1 10 X- 1 5 Reassessment of Adherence to Treatment Plan ASSESSMENTS - Wound and Skin Assessment / Reassessment X - Simple Wound Assessment / Reassessment - one wound 1 5 []  - 0 Complex Wound Assessment / Reassessment - multiple wounds []  - 0 Dermatologic / Skin Assessment (not related to wound area) ASSESSMENTS - Focused Assessment []  - Circumferential Edema Measurements - multi extremities 0 []  - 0 Nutritional Assessment / Counseling / Intervention []  - 0 Lower Extremity Assessment (monofilament, tuning fork, pulses) []  - 0 Peripheral Arterial Disease Assessment (using hand held doppler) ASSESSMENTS - Ostomy and/or Continence Assessment and Care []  - Incontinence Assessment and Management 0 []  - 0 Ostomy Care Assessment and Management (repouching, etc.) PROCESS - Coordination of Care X - Simple Patient / Family Education for ongoing care 1 15 []  - 0 Complex (extensive) Patient / Family Education for ongoing care X- 1 10 Staff obtains Chiropractor, Records, Test Results / Process Orders []  - 0 Staff telephones HHA, Nursing Homes / Clarify orders / etc []  - 0 Routine Transfer to another Facility (non-emergent condition) []  - 0 Routine Hospital Admission (non-emergent condition) []  - 0 New Admissions / Manufacturing engineer / Ordering NPWT, Apligraf, etc. []  - 0 Emergency Hospital Admission (emergent condition) X- 1 10 Simple Discharge Coordination Okray, Kerin E. ([id number]) []  - 0 Complex (extensive) Discharge Coordination PROCESS - Special Needs []  - Pediatric / Minor Patient Management 0 []  - 0 Isolation Patient Management []  - 0 Hearing / Language / Visual special needs []  - 0 Assessment of Community assistance (transportation, D/C planning, etc.) []  - 0 Additional assistance / Altered  mentation []  - 0 Support Surface(s) Assessment (bed, cushion, seat, etc.) INTERVENTIONS - Wound Cleansing / Measurement X - Simple Wound Cleansing - one wound 1 5 []  - 0 Complex Wound Cleansing -  multiple wounds X- 1 5 Wound Imaging (photographs - any number of wounds) []  - 0 Wound Tracing (instead of photographs) X- 1 5 Simple Wound Measurement - one wound []  - 0 Complex Wound Measurement - multiple wounds INTERVENTIONS - Wound Dressings []  - Small Wound Dressing one or multiple wounds 0 X- 1 15 Medium Wound Dressing one or multiple wounds []  - 0 Large Wound Dressing one or multiple wounds X- 1 5 Application of Medications - topical []  - 0 Application of Medications - injection INTERVENTIONS - Miscellaneous []  - External ear exam 0 []  - 0 Specimen Collection (cultures, biopsies, blood, body fluids, etc.) []  - 0 Specimen(s) / Culture(s) sent or taken to Lab for analysis X- 1 10 Patient Transfer (multiple staff / Nurse, adult / Similar devices) []  - 0 Simple Staple / Suture removal (25 or less) []  - 0 Complex Staple / Suture removal (26 or more) []  - 0 Hypo / Hyperglycemic Management (close monitor of Blood Glucose) []  - 0 Ankle / Brachial Index (ABI) - do not check if billed separately X- 1 5 Vital Signs [name], [name] ([id number]) Has the patient been seen at the hospital within the last three years: Yes Total Score: 105 Level Of Care: New/Established - Level 3 Electronic Signature(s) Signed: 05/30/2018 5:22:36 PM By: Curtis Sites Entered By: Curtis Sites on 05/30/2018 11:35:18 [name], [name] ([id number]) -------------------------------------------------------------------------------- Encounter Discharge Information Details Patient Name: [name], [name] Date of Service: 05/30/2018 11:00 AM Medical Record Number: [id number] Patient Account Number: [id number] Date of Birth/Sex: Feb 17, 1927 (85 y.o. Male) Treating RN: Curtis Sites Primary Care Bowyn Mercier:  Eleonore Chiquito Other Clinician: Referring Aryssa [name]: Eleonore Chiquito Treating Sherwood Castilla/Extender: Linwood Dibbles, HOYT Weeks in Treatment: 1 Encounter Discharge Information Items Post Procedure Vitals Discharge Condition: Stable Temperature (F): 98.2 Ambulatory Status: Wheelchair Pulse (bpm): 75 Discharge Destination: Skilled Nursing Facility Respiratory Rate (breaths/min): 16 Telephoned: No Blood Pressure (mmHg): 97/48 Orders Sent: Yes Transportation: Private Auto Accompanied By: caregiver Schedule Follow-up Appointment: Yes Clinical Summary of Care: Electronic Signature(s) Signed: 05/30/2018 5:22:36 PM By: Curtis Sites Entered By: Curtis Sites on 05/30/2018 11:36:55 [name], [name] ([id number]) -------------------------------------------------------------------------------- Lower Extremity Assessment Details Patient Name: [name], [name] Date of Service: 05/30/2018 11:00 AM Medical Record Number: [id number] Patient Account Number: [id number] Date of Birth/Sex: Nov 08, 1926 (85 y.o. Male) Treating RN: Arnette Norris Primary Care Cyenna Rebello: Eleonore Chiquito Other Clinician: Referring Mariona Scholes: Eleonore Chiquito Treating Ziya Coonrod/Extender: Linwood Dibbles, HOYT Weeks in Treatment: 1 Electronic Signature(s) Signed: 06/01/2018 4:21:01 PM By: Arnette Norris Entered By: Arnette Norris on 05/30/2018 11:17:31 [name], [name] ([id number]) -------------------------------------------------------------------------------- Multi Wound Chart Details Patient Name: [name], [name] Date of Service: 05/30/2018 11:00 AM Medical Record Number: [id number] Patient Account Number: [id number] Date of Birth/Sex: 1927/01/19 (85 y.o. Male) Treating RN: Curtis Sites Primary Care Antoin Dargis: Eleonore Chiquito Other Clinician: Referring Aidenjames Heckmann: Eleonore Chiquito Treating Ilean Spradlin/Extender: Linwood Dibbles, HOYT Weeks in Treatment: 1 Vital Signs Height(in): 67 Pulse(bpm): 75 Weight(lbs): 186 Blood  Pressure(mmHg): 97/48 Body Mass Index(BMI): 29 Temperature(F): 98.2 Respiratory Rate 16 (breaths/min): Photos: [2:No Photos] [N/A:N/A] Wound Location: [2:Sacrum - Medial] [N/A:N/A] Wounding Event: [2:Pressure Injury] [N/A:N/A] Primary Etiology: [2:Pressure Ulcer] [N/A:N/A] Comorbid History: [2:Chronic Obstructive Pulmonary Disease (COPD), Congestive Heart Failure, Hypertension, Osteoarthritis] [N/A:N/A] Date Acquired: [2:02/14/2017] [N/A:N/A] Weeks of Treatment: [2:1] [N/A:N/A] Wound Status: [2:Open] [N/A:N/A] Measurements L x W x D [2:4x7.2x3.6] [N/A:N/A] (cm) Area (cm) : [2:22.619] [N/A:N/A] Volume (cm) : [2:81.43] [N/A:N/A] % Reduction in Area: [2:0.00%] [N/A:N/A] % Reduction in Volume: [2:-9.10%] [N/A:N/A] Starting Position 1 [2:3] (o'clock): Ending Position  1 [2:5] (o'clock): Maximum Distance 1 (cm): [2:4.2] Undermining: [2:Yes] [N/A:N/A] Classification: [2:Category/Stage IV] [N/A:N/A] Exudate Amount: [2:Large] [N/A:N/A] Exudate Type: [2:Purulent] [N/A:N/A] Exudate Color: [2:yellow, brown, green] [N/A:N/A] Foul Odor After Cleansing: [2:Yes] [N/A:N/A] Odor Anticipated Due to [2:No] [N/A:N/A] Product Use: Wound Margin: [2:Epibole] [N/A:N/A] Granulation Amount: [2:None Present (0%)] [N/A:N/A] Necrotic Amount: [2:Large (67-100%)] [N/A:N/A] Exposed Structures: [2:Fascia: Yes Fat Layer (Subcutaneous Tissue) Exposed: Yes Muscle: Yes] [N/A:N/A] Tendon: No Joint: No Bone: No Epithelialization: None N/A N/A Periwound Skin Texture: Excoriation: No N/A N/A Induration: No Callus: No Crepitus: No Rash: No Scarring: No Periwound Skin Moisture: Dry/Scaly: Yes N/A N/A Maceration: No Periwound Skin Color: Erythema: Yes N/A N/A Atrophie Blanche: No Cyanosis: No Ecchymosis: No Hemosiderin Staining: No Mottled: No Pallor: No Rubor: No Erythema Location: [place] Stre[place] N/A N/A Temperature: No Abnormality N/A N/A Tenderness on Palpation: Yes N/A N/A Wound  Preparation: Ulcer Cleansing: N/A N/A Rinsed/Irrigated with Saline Topical Anesthetic Applied: Other: lidocaine 4% Treatment Notes Electronic Signature(s) Signed: 05/30/2018 5:22:36 PM By: Curtis Sites Entered By: Curtis Sites on 05/30/2018 11:26:50 Kabler, [name] ([id number]) -------------------------------------------------------------------------------- Multi-Disciplinary Care Plan Details Patient Name: [name], [name] Date of Service: 05/30/2018 11:00 AM Medical Record Number: [id number] Patient Account Number: [id number] Date of Birth/Sex: 22-Aug-1926 (85 y.o. Male) Treating RN: Curtis Sites Primary Care Chadwick Reiswig: Eleonore Chiquito Other Clinician: Referring Jamea Robicheaux: Eleonore Chiquito Treating Delta Deshmukh/Extender: Linwood Dibbles, HOYT Weeks in Treatment: 1 Active Inactive Abuse / Safety / Falls / Self Care Management Nursing Diagnoses: Impaired physical mobility Goals: Patient will not develop complications from immobility Date Initiated: 05/23/2018 Target Resolution Date: 08/19/2018 Goal Status: Active Interventions: Assess fall risk on admission and as needed Notes: Nutrition Nursing Diagnoses: Potential for alteratiion in Nutrition/Potential for imbalanced nutrition Goals: Patient/caregiver agrees to and verbalizes understanding of need to use nutritional supplements and/or vitamins as prescribed Date Initiated: 05/23/2018 Target Resolution Date: 08/19/2018 Goal Status: Active Interventions: Assess patient nutrition upon admission and as needed per policy Notes: Orientation to the Wound Care Program Nursing Diagnoses: Knowledge deficit related to the wound healing center program Goals: Patient/caregiver will verbalize understanding of the Wound Healing Center Program Date Initiated: 05/23/2018 Target Resolution Date: 08/19/2018 Goal Status: Active Interventions: Provide education on orientation to the wound center [name], [name]  ([id number]) Notes: Pressure Nursing Diagnoses: Knowledge deficit related to causes and risk factors for pressure ulcer development Goals: Patient will remain free from development of additional pressure ulcers Date Initiated: 05/23/2018 Target Resolution Date: 08/19/2018 Goal Status: Active Interventions: Assess potential for pressure ulcer upon admission and as needed Notes: Wound/Skin Impairment Nursing Diagnoses: Impaired tissue integrity Goals: Ulcer/skin breakdown will heal within 14 weeks Date Initiated: 05/23/2018 Target Resolution Date: 08/19/2018 Goal Status: Active Interventions: Assess patient/caregiver ability to obtain necessary supplies Assess patient/caregiver ability to perform ulcer/skin care regimen upon admission and as needed Assess ulceration(s) every visit Notes: Electronic Signature(s) Signed: 05/30/2018 5:22:36 PM By: Curtis Sites Entered By: Curtis Sites on 05/30/2018 11:26:41 [name], Jayanth E. ([id number]) -------------------------------------------------------------------------------- Pain Assessment Details Patient Name: Renn, Layden E. Date of Service: 05/30/2018 11:00 AM Medical Record Number: [id number] Patient Account Number: [id number] Date of Birth/Sex: 11/24/1926 (85 y.o. Male) Treating RN: Curtis Sites Primary Care Samier Jaco: Eleonore Chiquito Other Clinician: Referring Carla Rashad: Eleonore Chiquito Treating Asusena Sigley/Extender: Linwood Dibbles, HOYT Weeks in Treatment: 1 Active Problems Location of Pain Severity and Description of Pain Patient Has Paino Yes Site Locations Pain Management and Medication Current Pain Management: Electronic Signature(s) Signed: 05/30/2018 1:47:13 PM By: Dayton Martes RCP, RRT, CHT Signed: 05/30/2018 5:22:36 PM  By: Curtis Sitesorthy, Joanna Entered By: Dayton MartesWallace, RCP,RRT,CHT, Sallie on 05/30/2018 10:58:44 Ports, [name] ChristmasEVERETT E.  ([id number]) -------------------------------------------------------------------------------- Patient/Caregiver Education Details Patient Name: Capetillo, Raad E. Date of Service: 05/30/2018 11:00 AM Medical Record Number: [id number] Patient Account Number: [id number] Date of Birth/Gender: [birth year] 3(85 y.o. Male) Treating RN: Curtis Sitesorthy, Joanna Primary Care Physician: Eleonore ChiquitoKWIATKOWSKI, PETER Other Clinician: Referring Physician: Eleonore ChiquitoKWIATKOWSKI, PETER Treating Physician/Extender: Skeet SimmerSTONE III, HOYT Weeks in Treatment: 1 Education Assessment Education Provided To: Caregiver SNF nurses via written orders Education Topics Provided Wound/Skin Impairment: Handouts: Other: signed wound care orders Methods: Printed Electronic Signature(s) Signed: 05/30/2018 5:22:36 PM By: Curtis Sitesorthy, Joanna Entered By: Curtis Sitesorthy, Joanna on 05/30/2018 11:35:58 Graig, Ha E. ([id number]) -------------------------------------------------------------------------------- Wound Assessment Details Patient Name: Thunder, Bakary E. Date of Service: 05/30/2018 11:00 AM Medical Record Number: [id number] Patient Account Number: [id number] Date of Birth/Sex: [birth year](85 y.o. Male) Treating RN: Arnette NorrisBiell, Kristina Primary Care Lasaro Primm: Eleonore ChiquitoKWIATKOWSKI, PETER Other Clinician: Referring Davison Ohms: Eleonore ChiquitoKWIATKOWSKI, PETER Treating Adyline Huberty/Extender: Linwood DibblesSTONE III, HOYT Weeks in Treatment: 1 Wound Status Wound Number: 2 Primary Pressure Ulcer Etiology: Wound Location: Sacrum - Medial Wound Open Wounding Event: Pressure Injury Status: Date Acquired: 02/14/2017 Comorbid Chronic Obstructive Pulmonary Disease Weeks Of Treatment: 1 History: (COPD), Congestive Heart Failure, Clustered Wound: No Hypertension, Osteoarthritis Wound Measurements Length: (cm) 4 % Reduct Width: (cm) 7.2 % Reduct Depth: (cm) 3.6 Epitheli Area: (cm) 22.619 Tunneli Volume: (cm) 81.43 Undermi Start Endin Maxim ion in Area: 0% ion in Volume: -9.1% alization: None ng:  No ning: Yes ing Position (o'clock): 3 g Position (o'clock): 5 um Distance: (cm) 4.2 Wound Description Classification: Category/Stage IV Foul Odo Wound Margin: Epibole Due to P Exudate Amount: Large Slough/F Exudate Type: Purulent Exudate Color: yellow, brown, green r After Cleansing: Yes roduct Use: No ibrino Yes Wound Bed Granulation Amount: None Present (0%) Exposed Structure Necrotic Amount: Large (67-100%) Fascia Exposed: Yes Necrotic Quality: Adherent Slough Fat Layer (Subcutaneous Tissue) Exposed: Yes Tendon Exposed: No Muscle Exposed: Yes Necrosis of Muscle: No Joint Exposed: No Bone Exposed: No Periwound Skin Texture Texture Color No Abnormalities Noted: No No Abnormalities Noted: No Callus: No Atrophie Blanche: No Crepitus: No Cyanosis: No Excoriation: No Ecchymosis: No Induration: No Erythema: Yes Rash: No Erythema Location: [place] Fahs, Nyree E. ([id number]) Scarring: No Hemosiderin Staining: No Mottled: No Moisture Pallor: No No Abnormalities Noted: No Rubor: No Dry / Scaly: Yes Maceration: No Temperature / Pain Temperature: No Abnormality Tenderness on Palpation: Yes Wound Preparation Ulcer Cleansing: Rinsed/Irrigated with Saline Topical Anesthetic Applied: Other: lidocaine 4%, Treatment Notes Wound #2 (Medial Sacrum) Notes Dakins moistened gauze, xtrasorb, abd secured with tape Electronic Signature(s) Signed: 06/01/2018 4:21:01 PM By: Arnette NorrisBiell, Kristina Entered By: Arnette NorrisBiell, Kristina on 05/30/2018 11:17:21 Brunetto, Garreth E. ([id number]) -------------------------------------------------------------------------------- Vitals Details Patient Name: Petroni, Morell E. Date of Service: 05/30/2018 11:00 AM Medical Record Number: [id number] Patient Account Number: [id number] Date of Birth/Sex: [birth year] 7(85 y.o. Male) Treating RN: Curtis Sitesorthy, Joanna Primary Care Renessa Wellnitz: Eleonore ChiquitoKWIATKOWSKI, PETER Other Clinician: Referring Christan Ciccarelli: Eleonore ChiquitoKWIATKOWSKI,  PETER Treating Harith Mccadden/Extender: Linwood DibblesSTONE III, HOYT Weeks in Treatment: 1 Vital Signs Time Taken: 10:58 Temperature (F): 98.2 Height (in): 67 Pulse (bpm): 75 Weight (lbs): 186 Respiratory Rate (breaths/min): 16 Body Mass Index (BMI): 29.1 Blood Pressure (mmHg): 97/48 Reference Range: 80 - 120 mg / dl Electronic Signature(s) Signed: 05/30/2018 1:47:13 PM By: Dayton MartesWallace, RCP,RRT,CHT, Sallie RCP, RRT, CHT Entered By: Dayton MartesWallace, RCP,RRT,CHT, Sallie on 05/30/2018 10:59:16

## 2018-06-13 ENCOUNTER — Encounter: Payer: Medicare Other | Admitting: Physician Assistant

## 2018-06-13 DIAGNOSIS — L89154 Pressure ulcer of sacral region, stage 4: Secondary | ICD-10-CM | POA: Diagnosis not present

## 2018-06-15 NOTE — Progress Notes (Signed)
Boney, ISTVAN WACHTEL ([id number]) Visit Report for 06/13/2018 Arrival Information Details Patient Name: [name], [name] Date of Service: 06/13/2018 10:00 AM Medical Record Number: [id number] Patient Account Number: [id number] Date of Birth/Sex: [birth year] (85 y.o. M) Treating RN: Curtis Sites Primary Care Shakinah Navis: Eleonore Chiquito Other Clinician: Referring Faun Mcqueen: Eleonore Chiquito Treating Isaish Alemu/Extender: Linwood Dibbles, HOYT Weeks in Treatment: 3 Visit Information History Since Last Visit Added or deleted any medications: No Patient Arrived: Wheel Chair Any new allergies or adverse reactions: No Arrival Time: 09:59 Had a fall or experienced change in No Accompanied By: caregiver, Bella Kennedy activities of daily living that may affect Transfer Assistance: Michiel Sites Lift risk of falls: Patient Identification Verified: Yes Signs or symptoms of abuse/neglect since last visito No Secondary Verification Process Completed: Yes Hospitalized since last visit: No Implantable device outside of the clinic excluding No cellular tissue based products placed in the center since last visit: Has Dressing in Place as Prescribed: Yes Pain Present Now: No Electronic Signature(s) Signed: 06/13/2018 4:05:03 PM By: Dayton Martes RCP, RRT, CHT Entered By: Dayton Martes on 06/13/2018 10:00:04 Humphres, Amareon EMarland Kitchen ([id number]) -------------------------------------------------------------------------------- Clinic Level of Care Assessment Details Patient Name: [name], [name] Date of Service: 06/13/2018 10:00 AM Medical Record Number: [id number] Patient Account Number: [id number] Date of Birth/Sex: 03-14-1927 (85 y.o. M) Treating RN: Curtis Sites Primary Care Destanee Bedonie: Eleonore Chiquito Other Clinician: Referring Aiyannah Fayad: Eleonore Chiquito Treating Aleicia Kenagy/Extender: Linwood Dibbles, HOYT Weeks in Treatment: 3 Clinic Level of Care Assessment Items TOOL 4 Quantity Score []  - Use when  only an EandM is performed on FOLLOW-UP visit 0 ASSESSMENTS - Nursing Assessment / Reassessment X - Reassessment of Co-morbidities (includes updates in patient status) 1 10 X- 1 5 Reassessment of Adherence to Treatment Plan ASSESSMENTS - Wound and Skin Assessment / Reassessment X - Simple Wound Assessment / Reassessment - one wound 1 5 []  - 0 Complex Wound Assessment / Reassessment - multiple wounds []  - 0 Dermatologic / Skin Assessment (not related to wound area) ASSESSMENTS - Focused Assessment []  - Circumferential Edema Measurements - multi extremities 0 []  - 0 Nutritional Assessment / Counseling / Intervention []  - 0 Lower Extremity Assessment (monofilament, tuning fork, pulses) []  - 0 Peripheral Arterial Disease Assessment (using hand held doppler) ASSESSMENTS - Ostomy and/or Continence Assessment and Care X - Incontinence Assessment and Management 1 10 []  - 0 Ostomy Care Assessment and Management (repouching, etc.) PROCESS - Coordination of Care X - Simple Patient / Family Education for ongoing care 1 15 []  - 0 Complex (extensive) Patient / Family Education for ongoing care X- 1 10 Staff obtains Chiropractor, Records, Test Results / Process Orders X- 1 10 Staff telephones HHA, Nursing Homes / Clarify orders / etc []  - 0 Routine Transfer to another Facility (non-emergent condition) []  - 0 Routine Hospital Admission (non-emergent condition) []  - 0 New Admissions / Manufacturing engineer / Ordering NPWT, Apligraf, etc. []  - 0 Emergency Hospital Admission (emergent condition) X- 1 10 Simple Discharge Coordination Michalowski, Emett E. ([id number]) []  - 0 Complex (extensive) Discharge Coordination PROCESS - Special Needs []  - Pediatric / Minor Patient Management 0 []  - 0 Isolation Patient Management []  - 0 Hearing / Language / Visual special needs []  - 0 Assessment of Community assistance (transportation, D/C planning, etc.) []  - 0 Additional assistance / Altered  mentation []  - 0 Support Surface(s) Assessment (bed, cushion, seat, etc.) INTERVENTIONS - Wound Cleansing / Measurement X - Simple Wound Cleansing - one wound 1 5 []  - 0 Complex Wound  Cleansing - multiple wounds X- 1 5 Wound Imaging (photographs - any number of wounds) []  - 0 Wound Tracing (instead of photographs) X- 1 5 Simple Wound Measurement - one wound []  - 0 Complex Wound Measurement - multiple wounds INTERVENTIONS - Wound Dressings X - Small Wound Dressing one or multiple wounds 1 10 []  - 0 Medium Wound Dressing one or multiple wounds []  - 0 Large Wound Dressing one or multiple wounds X- 1 5 Application of Medications - topical []  - 0 Application of Medications - injection INTERVENTIONS - Miscellaneous []  - External ear exam 0 []  - 0 Specimen Collection (cultures, biopsies, blood, body fluids, etc.) []  - 0 Specimen(s) / Culture(s) sent or taken to Lab for analysis X- 1 10 Patient Transfer (multiple staff / Nurse, adult / Similar devices) []  - 0 Simple Staple / Suture removal (25 or less) []  - 0 Complex Staple / Suture removal (26 or more) []  - 0 Hypo / Hyperglycemic Management (close monitor of Blood Glucose) []  - 0 Ankle / Brachial Index (ABI) - do not check if billed separately X- 1 5 Vital Signs Walle, Mohannad E. ([id number]) Has the patient been seen at the hospital within the last three years: Yes Total Score: 120 Level Of Care: New/Established - Level 4 Electronic Signature(s) Signed: 06/13/2018 4:47:13 PM By: Curtis Sites Entered By: Curtis Sites on 06/13/2018 11:49:52 Grob, Esequiel EMarland Kitchen ([id number]) -------------------------------------------------------------------------------- Encounter Discharge Information Details Patient Name: [name], [name] Date of Service: 06/13/2018 10:00 AM Medical Record Number: [id number] Patient Account Number: [id number] Date of Birth/Sex: 1927-05-07 (85 y.o. M) Treating RN: Curtis Sites Primary Care Alyce Inscore:  Eleonore Chiquito Other Clinician: Referring Shay Bartoli: Eleonore Chiquito Treating Kayna Suppa/Extender: Linwood Dibbles, HOYT Weeks in Treatment: 3 Encounter Discharge Information Items Discharge Condition: Stable Ambulatory Status: Wheelchair Discharge Destination: Skilled Nursing Facility Telephoned: Yes Spoke With: nurse about NPWT Orders Sent: Yes Transportation: Private Auto Accompanied By: CNA Schedule Follow-up Appointment: Yes Clinical Summary of Care: Electronic Signature(s) Signed: 06/13/2018 4:47:13 PM By: Curtis Sites Entered By: Curtis Sites on 06/13/2018 11:35:28 [place], Cove EMarland Kitchen ([id number]) -------------------------------------------------------------------------------- Lower Extremity Assessment Details Patient Name: Gedney, Vandy E. Date of Service: 06/13/2018 10:00 AM Medical Record Number: [id number] Patient Account Number: [id number] Date of Birth/Sex: 09-03-1926 (85 y.o. M) Treating RN: Rema Jasmine Primary Care Neysha Criado: Eleonore Chiquito Other Clinician: Referring Ardell Aaronson: Eleonore Chiquito Treating Mikyah Alamo/Extender: Linwood Dibbles, HOYT Weeks in Treatment: 3 Electronic Signature(s) Signed: 06/13/2018 11:27:43 AM By: Rema Jasmine Entered By: Rema Jasmine on 06/13/2018 10:17:08 Gerling, Vonte E. ([id number]) -------------------------------------------------------------------------------- Multi Wound Chart Details Patient Name: Clagett, Justyn E. Date of Service: 06/13/2018 10:00 AM Medical Record Number: [id number] Patient Account Number: [id number] Date of Birth/Sex: March 20, 1927 (85 y.o. M) Treating RN: Curtis Sites Primary Care Margurite Duffy: Eleonore Chiquito Other Clinician: Referring Keviana Guida: Eleonore Chiquito Treating Shakeda Pearse/Extender: Linwood Dibbles, HOYT Weeks in Treatment: 3 Vital Signs Height(in): 67 Pulse(bpm): 69 Weight(lbs): 186 Blood Pressure(mmHg): 94/44 Body Mass Index(BMI): 29 Temperature(F): 97.9 Respiratory Rate 16 (breaths/min): Photos:  [N/A:N/A] Wound Location: Sacrum - Medial N/A N/A Wounding Event: Pressure Injury N/A N/A Primary Etiology: Pressure Ulcer N/A N/A Comorbid History: Chronic Obstructive N/A N/A Pulmonary Disease (COPD), Congestive Heart Failure, Hypertension, Osteoarthritis Date Acquired: 02/14/2017 N/A N/A Weeks of Treatment: 3 N/A N/A Wound Status: Open N/A N/A Measurements L x W x D 7x5x3.4 N/A N/A (cm) Area (cm) : 27.489 N/A N/A Volume (cm) : 93.462 N/A N/A % Reduction in Area: -21.50% N/A N/A % Reduction in Volume: -25.20% N/A N/A Classification: Category/Stage IV N/A N/A Exudate  Amount: Large N/A N/A Exudate Type: Purulent N/A N/A Exudate Color: yellow, brown, green N/A N/A Foul Odor After Cleansing: Yes N/A N/A Odor Anticipated Due to No N/A N/A Product Use: Wound Margin: Epibole N/A N/A Granulation Amount: None Present (0%) N/A N/A Necrotic Amount: Large (67-100%) N/A N/A Exposed Structures: Fascia: Yes N/A N/A Fat Layer (Subcutaneous Tissue) Exposed: Yes Lentz, Kha E. ([id number]) Muscle: Yes Tendon: No Joint: No Bone: No Epithelialization: None N/A N/A Periwound Skin Texture: Excoriation: No N/A N/A Induration: No Callus: No Crepitus: No Rash: No Scarring: No Periwound Skin Moisture: Dry/Scaly: Yes N/A N/A Maceration: No Periwound Skin Color: Erythema: Yes N/A N/A Atrophie Blanche: No Cyanosis: No Ecchymosis: No Hemosiderin Staining: No Mottled: No Pallor: No Rubor: No Erythema Location: Red Streaks N/A N/A Temperature: No Abnormality N/A N/A Tenderness on Palpation: Yes N/A N/A Wound Preparation: Ulcer Cleansing: N/A N/A Rinsed/Irrigated with Saline Topical Anesthetic Applied: Other: lidocaine 4% Treatment Notes Electronic Signature(s) Signed: 06/13/2018 4:47:13 PM By: Curtis Sitesorthy, Joanna Entered By: Curtis Sitesorthy, Joanna on 06/13/2018 10:43:13 Tinner, Karna ChristmasEVERETT E.  ([id number]) -------------------------------------------------------------------------------- Multi-Disciplinary Care Plan Details Patient Name: Plair, Jakorian E. Date of Service: 06/13/2018 10:00 AM Medical Record Number: [id number] Patient Account Number: [id number] Date of Birth/Sex: March 06, 1927 (85 y.o. M) Treating RN: Curtis Sitesorthy, Joanna Primary Care Joshua Soulier: Eleonore ChiquitoKWIATKOWSKI, PETER Other Clinician: Referring Earlean Fidalgo: Eleonore ChiquitoKWIATKOWSKI, PETER Treating Daine Gunther/Extender: Linwood DibblesSTONE III, HOYT Weeks in Treatment: 3 Active Inactive Abuse / Safety / Falls / Self Care Management Nursing Diagnoses: Impaired physical mobility Goals: Patient will not develop complications from immobility Date Initiated: 05/23/2018 Target Resolution Date: 08/19/2018 Goal Status: Active Interventions: Assess fall risk on admission and as needed Notes: Nutrition Nursing Diagnoses: Potential for alteratiion in Nutrition/Potential for imbalanced nutrition Goals: Patient/caregiver agrees to and verbalizes understanding of need to use nutritional supplements and/or vitamins as prescribed Date Initiated: 05/23/2018 Target Resolution Date: 08/19/2018 Goal Status: Active Interventions: Assess patient nutrition upon admission and as needed per policy Notes: Orientation to the Wound Care Program Nursing Diagnoses: Knowledge deficit related to the wound healing center program Goals: Patient/caregiver will verbalize understanding of the Wound Healing Center Program Date Initiated: 05/23/2018 Target Resolution Date: 08/19/2018 Goal Status: Active Interventions: Provide education on orientation to the wound center Boshers, Kenny E. ([id number]) Notes: Pressure Nursing Diagnoses: Knowledge deficit related to causes and risk factors for pressure ulcer development Goals: Patient will remain free from development of additional pressure ulcers Date Initiated: 05/23/2018 Target Resolution Date: 08/19/2018 Goal Status:  Active Interventions: Assess potential for pressure ulcer upon admission and as needed Notes: Wound/Skin Impairment Nursing Diagnoses: Impaired tissue integrity Goals: Ulcer/skin breakdown will heal within 14 weeks Date Initiated: 05/23/2018 Target Resolution Date: 08/19/2018 Goal Status: Active Interventions: Assess patient/caregiver ability to obtain necessary supplies Assess patient/caregiver ability to perform ulcer/skin care regimen upon admission and as needed Assess ulceration(s) every visit Notes: Electronic Signature(s) Signed: 06/13/2018 4:47:13 PM By: Curtis Sitesorthy, Joanna Entered By: Curtis Sitesorthy, Joanna on 06/13/2018 10:43:05 Fallaw, Terril E. ([id number]) -------------------------------------------------------------------------------- Pain Assessment Details Patient Name: Rosemond, Drevion E. Date of Service: 06/13/2018 10:00 AM Medical Record Number: [id number] Patient Account Number: [id number] Date of Birth/Sex: March 06, 1927 (85 y.o. M) Treating RN: Curtis Sitesorthy, Joanna Primary Care Commodore Bellew: Eleonore ChiquitoKWIATKOWSKI, PETER Other Clinician: Referring Ayasha Ellingsen: Eleonore ChiquitoKWIATKOWSKI, PETER Treating Maranatha Grossi/Extender: Linwood DibblesSTONE III, HOYT Weeks in Treatment: 3 Active Problems Location of Pain Severity and Description of Pain Patient Has Paino No Site Locations Pain Management and Medication Current Pain Management: Electronic Signature(s) Signed: 06/13/2018 4:05:03 PM By: Sallee ProvencalWallace, RCP,RRT,CHT, Sallie RCP, RRT, CHT Signed: 06/13/2018 4:47:13 PM By: Curtis Sitesorthy, Joanna  Entered By: Dayton Martes on 06/13/2018 10:00:13 Nemmers, Karna Christmas ([id number]) -------------------------------------------------------------------------------- Patient/Caregiver Education Details Patient Name: Parks, Khayri E. Date of Service: 06/13/2018 10:00 AM Medical Record Number: [id number] Patient Account Number: [id number] Date of Birth/Gender: December 17, 1926 (85 y.o. M) Treating RN: Curtis Sites Primary Care Physician: Eleonore Chiquito  Other Clinician: Referring Physician: Eleonore Chiquito Treating Physician/Extender: Skeet Simmer in Treatment: 3 Education Assessment Education Provided To: Caregiver SNF nurses via written orders Education Topics Provided Wound/Skin Impairment: Handouts: Other: signed wound care orders Methods: Printed Electronic Signature(s) Signed: 06/13/2018 4:47:13 PM By: Curtis Sites Entered By: Curtis Sites on 06/13/2018 11:35:49 Mack, Itay E. ([id number]) -------------------------------------------------------------------------------- Wound Assessment Details Patient Name: Fanfan, Gionni E. Date of Service: 06/13/2018 10:00 AM Medical Record Number: [id number] Patient Account Number: [id number] Date of Birth/Sex: January 25, 1927 (85 y.o. M) Treating RN: Rema Jasmine Primary Care Denisia Harpole: Eleonore Chiquito Other Clinician: Referring Amarylis Rovito: Eleonore Chiquito Treating Yasin Ducat/Extender: Linwood Dibbles, HOYT Weeks in Treatment: 3 Wound Status Wound Number: 2 Primary Pressure Ulcer Etiology: Wound Location: Sacrum - Medial Wound Open Wounding Event: Pressure Injury Status: Date Acquired: 02/14/2017 Comorbid Chronic Obstructive Pulmonary Disease Weeks Of Treatment: 3 History: (COPD), Congestive Heart Failure, Clustered Wound: No Hypertension, Osteoarthritis Photos Photo Uploaded By: Rema Jasmine on 06/13/2018 10:28:54 Wound Measurements Length: (cm) 7 Width: (cm) 5 Depth: (cm) 3.4 Area: (cm) 27.489 Volume: (cm) 93.462 % Reduction in Area: -21.5% % Reduction in Volume: -25.2% Epithelialization: None Tunneling: No Undermining: No Wound Description Classification: Category/Stage IV Foul Odor A Wound Margin: Epibole Due to Prod Exudate Amount: Large Slough/Fibr Exudate Type: Purulent Exudate Color: yellow, brown, green fter Cleansing: Yes uct Use: No ino Yes Wound Bed Granulation Amount: None Present (0%) Exposed Structure Necrotic Amount: Large (67-100%) Fascia  Exposed: Yes Necrotic Quality: Adherent Slough Fat Layer (Subcutaneous Tissue) Exposed: Yes Tendon Exposed: No Muscle Exposed: Yes Necrosis of Muscle: No Joint Exposed: No Bone Exposed: No Saindon, Carole E. ([id number]) Periwound Skin Texture Texture Color No Abnormalities Noted: No No Abnormalities Noted: No Callus: No Atrophie Blanche: No Crepitus: No Cyanosis: No Excoriation: No Ecchymosis: No Induration: No Erythema: Yes Rash: No Erythema Location: Red Streaks Scarring: No Hemosiderin Staining: No Mottled: No Moisture Pallor: No No Abnormalities Noted: No Rubor: No Dry / Scaly: Yes Maceration: No Temperature / Pain Temperature: No Abnormality Tenderness on Palpation: Yes Wound Preparation Ulcer Cleansing: Rinsed/Irrigated with Saline Topical Anesthetic Applied: Other: lidocaine 4%, Treatment Notes Wound #2 (Medial Sacrum) Notes Vashe moistened gauze, xtrasorb, abd secured with tape Electronic Signature(s) Signed: 06/13/2018 11:27:43 AM By: Rema Jasmine Entered By: Rema Jasmine on 06/13/2018 10:16:41 Wherley, Taydon E. ([id number]) -------------------------------------------------------------------------------- Vitals Details Patient Name: Lard, Eason E. Date of Service: 06/13/2018 10:00 AM Medical Record Number: [id number] Patient Account Number: [id number] Date of Birth/Sex: 1927-04-20 (85 y.o. M) Treating RN: Curtis Sites Primary Care Felicia Both: Eleonore Chiquito Other Clinician: Referring [name] Stith: Eleonore Chiquito Treating Zeinab Rodwell/Extender: Linwood Dibbles, HOYT Weeks in Treatment: 3 Vital Signs Time Taken: 10:00 Temperature (F): 97.9 Height (in): 67 Pulse (bpm): 69 Weight (lbs): 186 Respiratory Rate (breaths/min): 16 Body Mass Index (BMI): 29.1 Blood Pressure (mmHg): 94/44 Reference Range: 80 - 120 mg / dl Airway Electronic Signature(s) Signed: 06/13/2018 4:05:03 PM By: Dayton Martes RCP, RRT, CHT Entered By: Dayton Martes on 06/13/2018 10:00:41

## 2018-06-16 NOTE — Progress Notes (Signed)
Pitera, [name] ChristmasVERETT E. ([id number]) Visit Report for 06/13/2018 Chief Complaint Document Details Patient Name: [name], [name] Date of Service: 06/13/2018 10:00 AM Medical Record Number: [id number] Patient Account Number: [id number] Date of Birth/Sex: 08/08/1926 (85 y.o. M) Treating RN: Curtis Sitesorthy, Joanna Primary Care Provider: Eleonore ChiquitoKWIATKOWSKI, PETER Other Clinician: Referring Provider: Eleonore ChiquitoKWIATKOWSKI, PETER Treating Provider/Extender: Linwood DibblesSTONE [name], [name] Surles Weeks in Treatment: 3 Information Obtained from: Patient Chief Complaint Sacral pressure ulcer Electronic Signature(s) Signed: 06/15/2018 1:01:02 AM By: Lenda KelpStone [name], Zayne Marovich [name] Entered By: Lenda KelpStone [name], Haja Crego on 06/13/2018 09:43:13 [name], [name] ([id number]) -------------------------------------------------------------------------------- HPI Details Patient Name: [name], [name] Date of Service: 06/13/2018 10:00 AM Medical Record Number: [id number] Patient Account Number: [id number] Date of Birth/Sex: 08/08/1926 (85 y.o. M) Treating RN: Curtis Sitesorthy, Joanna Primary Care Provider: Eleonore ChiquitoKWIATKOWSKI, PETER Other Clinician: Referring Provider: Eleonore ChiquitoKWIATKOWSKI, PETER Treating Provider/Extender: Linwood DibblesSTONE [name], [name] Weeks in Treatment: 3 History of Present Illness HPI Description: 04/20/17; this is an 85 year old man who is a bit of an unfortunate story. He was initially admitted to hospital from 9/10 through 9/14 for an elective left total knee replacement secondary to osteoarthritis. On 9/17 a nurse in the hospital noted non-blanchable erythema. The patient was discharged to [facility] facility. An admission note by Dr. Lyn HollingsheadAlexander on 01/31/17 noted him unstageable sacral decubitus ulcer. The patient was then admitted to hospital from 9/24 through 10/8 now with a sacral wound felt to be infected. The patient had fever and chills. Required in his surgical IandD. Blood cultures grew Clostridium Ramosium and Bacteroides fragilis. I am not able to see the Bacteroides in care  everywhere today. Nevertheless the patient was seen by infectious disease and recommended for 6 weeks of ceftriaxone and Flagyl which should be completed. An x-ray in the hospital on 9/24 showed peroneal air which may be secondary to a decubitus ulcer no evidence for acute osseous or bone destructive change. An x-ray in the facility on 11/29 showed findings consistent with acute osteomyelitis of the distal coccyx.. Given the history here I suspect that this is probably not all acute and could have resulted at the time of infection the patient has a history of chronic atrial fibrillation on Xarelto, congestive heart failure with an EF of 40%, hypertension, BPH and osteoarthritis. He has been referred here for consideration of biopsy and culture of exposed coccyx It is difficult to follow the course of this patient's wound. Certainly he could have had underlying osteo in the hospital that wasn't well identified by plane xray and could have had more resistant organisms that were identified. I think we should wait for the results of the bone bx and cx today before initiating any antibiotics. Readmission: 05/23/18 patient presents today for reevaluation here in our clinic although it has been quite some time since he was last here. He is actually a patient of mine which [facility]. Subsequently he has a significant stage for pressure ulcer to the gluteal region which coupled with his muscle weakness is causing a significant issue at this point as far as healing is concerned. It appears that this patient likely has an infection as well in the wound bed. He actually arrived today with no dressing in place and there was actually evidence of fecal material in the wound bed. His daughter was present for the evaluation today as well as a CNA from the facility. The patient has had this wound since I first began seeing him on October 2018. I last saw him for evaluation on January 05, 2018. This was that [place] when I was still going to the nursing facilities. He said no on-site wound care from a provider other than that the facility since that time. He has not been on any recent antibiotics for the wound. The wound was significantly smaller when I last saw him measuring 3.5 x 2.5 x 3. Today this is much larger and deeper. There's also a significant amount of necrotic material which was not present when I last saw him. 05/30/18 on evaluation today patient appears to be doing excellent in regard to his sacral wound especially as compared to my last evaluation with him. He has been tolerating the dressing changes without complication and though there is some Slough noted there is really no significant bone noted exposed in the wound bed other than a small piece that is working itself out within some granulation tissue. Overall I feel like he's made excellent progress this wound appears to be night and day compared to what it looks like last week. 06/13/18 on evaluation today patient's sacral wound though large appears to be doing excellent. There's no sign of pressure injury and in general the tissue quality the sacral region is excellent with great granulation there's no evidence of infection whatsoever. There is no odor. He also states that his pain is down from a 9-10 out of 10 two 2/10 that most but he did not even really react to me cleaning the wound today. Electronic Signature(s) Signed: 06/15/2018 1:01:02 AM By: Lenda Kelp [name] [name], [name] ([id number]) Entered By: Lenda Kelp on 06/15/2018 00:22:15 [name] ([id number]) -------------------------------------------------------------------------------- Physical Exam Details Patient Name: [name], [name] Date of Service: 06/13/2018 10:00 AM Medical Record Number: [id number] Patient Account Number: [id number] Date of Birth/Sex: December 17, 1926 (85 y.o. M) Treating RN: Curtis Sites Primary Care  Provider: Eleonore Chiquito Other Clinician: Referring Provider: Eleonore Chiquito Treating Provider/Extender: STONE [name], Daisy Mcneel Weeks in Treatment: 3 Constitutional Obese and well-hydrated in no acute distress. Respiratory normal breathing without difficulty. clear to auscultation bilaterally. Cardiovascular regular rate and rhythm with normal S1, S2. Psychiatric this patient is able to make decisions and demonstrates good insight into disease process. Alert and Oriented x 3. pleasant and cooperative. Notes Patient's wound bed shows no signs of infection and there is no erythema surrounding periwound I think the infection is doing significantly better and does appear to have cleared completely. Overall I feel like that the Vashe is doing an excellent job keeping this area healthy. Electronic Signature(s) Signed: 06/15/2018 1:01:02 AM By: Lenda Kelp [name] Entered By: Lenda Kelp on 06/15/2018 00:22:53 [name] ([id number]) -------------------------------------------------------------------------------- Physician Orders Details Patient Name: Doubek, Jp E. Date of Service: 06/13/2018 10:00 AM Medical Record Number: [id number] Patient Account Number: [id number] Date of Birth/Sex: 1926-06-28 (85 y.o. M) Treating RN: Curtis Sites Primary Care Provider: Eleonore Chiquito Other Clinician: Referring Provider: Eleonore Chiquito Treating Provider/Extender: Linwood Dibbles, Edrick Whitehorn Weeks in Treatment: 3 Verbal / Phone Orders: No Diagnosis Coding ICD-10 Coding Code Description L89.154 Pressure ulcer of sacral region, stage 4 M62.81 Muscle weakness (generalized) Wound Cleansing Wound #2 Medial Sacrum o Clean wound with Normal Saline. o May shower with protection. Primary Wound Dressing Wound #2 Medial Sacrum o Other: - Vashe moistened rolled gauze Secondary Dressing Wound #2 Medial Sacrum o ABD pad o XtraSorb - secured with tape Dressing Change Frequency Wound  #2 Medial Sacrum o Change dressing every day. - and as needed for soilage - please assess dressing integrity on every shift  Follow-up Appointments Wound #2 Medial Sacrum o Return Appointment in 2 weeks. Off-Loading Wound #2 Medial Sacrum o Mattress - Please continue with air mattress o Turn and reposition every 2 hours - Please assist patient with repositioning every 2 hours o Other: - Patient may sit on the side of his bed for meals Electronic Signature(s) Signed: 06/13/2018 4:47:13 PM By: Curtis Sites Signed: 06/15/2018 1:01:02 AM By: Lenda Kelp [name] Entered By: Curtis Sites on 06/13/2018 10:55:13 [name], [name] ([id number]) -------------------------------------------------------------------------------- Problem List Details Patient Name: [name], [name] Date of Service: 06/13/2018 10:00 AM Medical Record Number: [id number] Patient Account Number: [id number] Date of Birth/Sex: 12/01/1926 (85 y.o. M) Treating RN: Curtis Sites Primary Care Provider: Eleonore Chiquito Other Clinician: Referring Provider: Eleonore Chiquito Treating Provider/Extender: Linwood Dibbles, Herley Bernardini Weeks in Treatment: 3 Active Problems ICD-10 Evaluated Encounter Code Description Active Date Today Diagnosis L89.154 Pressure ulcer of sacral region, stage 4 05/23/2018 No Yes M62.81 Muscle weakness (generalized) 05/23/2018 No Yes Inactive Problems Resolved Problems Electronic Signature(s) Signed: 06/15/2018 1:01:02 AM By: Lenda Kelp [name] Entered By: Lenda Kelp on 06/13/2018 09:43:05 Tritch, Burhanuddin E. ([id number]) -------------------------------------------------------------------------------- Progress Note Details Patient Name: [name], [name] Date of Service: 06/13/2018 10:00 AM Medical Record Number: [id number] Patient Account Number: [id number] Date of Birth/Sex: [birth year] (85 y.o. M) Treating RN: Curtis Sites Primary Care Provider: Eleonore Chiquito Other Clinician: Referring  Provider: Eleonore Chiquito Treating Provider/Extender: Linwood Dibbles, Adjoa Althouse Weeks in Treatment: 3 Subjective Chief Complaint Information obtained from Patient Sacral pressure ulcer History of Present Illness (HPI) 04/20/17; this is an 85 year old man who is a bit of an unfortunate story. He was initially admitted to hospital from 9/10 through 9/14 for an elective left total knee replacement secondary to osteoarthritis. On 9/17 a nurse in the hospital noted non- blanchable erythema. The patient was discharged to [facility]. An admission note by Dr. Lyn Hollingshead on 01/31/17 noted him unstageable sacral decubitus ulcer. The patient was then admitted to hospital from 9/24 through 10/8 now with a sacral wound felt to be infected. The patient had fever and chills. Required in his surgical IandD. Blood cultures grew Clostridium Ramosium and Bacteroides fragilis. I am not able to see the Bacteroides in care everywhere today. Nevertheless the patient was seen by infectious disease and recommended for 6 weeks of ceftriaxone and Flagyl which should be completed. An x-ray in the hospital on 9/24 showed peroneal air which may be secondary to a decubitus ulcer no evidence for acute osseous or bone destructive change. An x-ray in the facility on 11/29 showed findings consistent with acute osteomyelitis of the distal coccyx.. Given the history here I suspect that this is probably not all acute and could have resulted at the time of infection the patient has a history of chronic atrial fibrillation on Xarelto, congestive heart failure with an EF of 40%, hypertension, BPH and osteoarthritis. He has been referred here for consideration of biopsy and culture of exposed coccyx It is difficult to follow the course of this patient's wound. Certainly he could have had underlying osteo in the hospital that wasn't well identified by plane xray and could have had more resistant organisms that were identified.  I think we should wait for the results of the bone bx and cx today before initiating any antibiotics. Readmission: 05/23/18 patient presents today for reevaluation here in our clinic although it has been quite some time since he was last here. He is actually a patient of mine which I was seeing at  [facility]. Subsequently he has a significant stage for pressure ulcer to the gluteal region which coupled with his muscle weakness is causing a significant issue at this point as far as healing is concerned. It appears that this patient likely has an infection as well in the wound bed. He actually arrived today with no dressing in place and there was actually evidence of fecal material in the wound bed. His daughter was present for the evaluation today as well as a CNA from the facility. The patient has had this wound since I first began seeing him on October 2018. I last saw him for evaluation on January 05, 2018. [place] when [place] was still going to the nursing facilities. He said no on-site wound care from a provider other than that the facility since that time. He has not been on any recent antibiotics for the wound. The wound was significantly smaller when I last saw him measuring 3.5 x 2.5 x 3. Today this is much larger and deeper. There's also a significant amount of necrotic material which was not present when I last saw him. 05/30/18 on evaluation today patient appears to be doing excellent in regard to his sacral wound especially as compared to my last evaluation with him. He has been tolerating the dressing changes without complication and though there is some Slough noted there is really no significant bone noted exposed in the wound bed other than a small piece that is working itself out within some granulation tissue. Overall I feel like he's made excellent progress this wound appears to be night and day compared to what it looks like last week. 06/13/18 on  evaluation today patient's sacral wound though large appears to be doing excellent. There's no sign of pressure injury and in general the tissue quality the sacral region is excellent with great granulation there's no evidence of infection [name], [name] ([id number]) whatsoever. There is no odor. He also states that his pain is down from a 9-10 out of 10 two 2/10 that most but he did not even really react to me cleaning the wound today. Patient History Information obtained from Patient. Family History Heart Disease - Mother,Father, Hypertension - Mother,Father, No family history of Cancer, Diabetes, Hereditary Spherocytosis, Kidney Disease, Lung Disease, Seizures, Stroke, Thyroid Problems, Tuberculosis. Social History Former smoker - quit in 1996, Marital Status - Widowed, Alcohol Use - Never, Drug Use - No History, Caffeine Use - Daily. Medical History Eyes Denies history of Cataracts, Glaucoma, Optic Neuritis Hematologic/Lymphatic Denies history of Anemia, Hemophilia, Human Immunodeficiency Virus, Lymphedema, Sickle Cell Disease Respiratory Patient has history of Chronic Obstructive Pulmonary Disease (COPD) Cardiovascular Patient has history of Congestive Heart Failure, Hypertension Genitourinary Denies history of End Stage Renal Disease Musculoskeletal Patient has history of Osteoarthritis Denies history of Osteomyelitis Oncologic Denies history of Received Chemotherapy, Received Radiation Medical And Surgical History Notes Eyes macular degeneration Respiratory continuous O2 Review of Systems (ROS) Constitutional Symptoms (General Health) Denies complaints or symptoms of Fever, Chills. Respiratory The patient has no complaints or symptoms. Cardiovascular The patient has no complaints or symptoms. Psychiatric The patient has no complaints or symptoms. Objective Constitutional [name], [name] ([id number]) Obese and well-hydrated in no acute distress. Vitals Time  Taken: 10:00 AM, Height: 67 in, Weight: 186 lbs, BMI: 29.1, Temperature: 97.9 F, Pulse: 69 bpm, Respiratory Rate: 16 breaths/min, Blood Pressure: 94/44 mmHg. Respiratory normal breathing without difficulty. clear to auscultation bilaterally. Cardiovascular regular rate and rhythm with normal S1, S2.  Psychiatric this patient is able to make decisions and demonstrates good insight into disease process. Alert and Oriented x 3. pleasant and cooperative. General Notes: Patient's wound bed shows no signs of infection and there is no erythema surrounding periwound I think the infection is doing significantly better and does appear to have cleared completely. Overall I feel like that the Vashe is doing an excellent job keeping this area healthy. Integumentary (Hair, Skin) Wound #2 status is Open. Original cause of wound was Pressure Injury. The wound is located on the Medial Sacrum. The wound measures 7cm length x 5cm width x 3.4cm depth; 27.489cm^2 area and 93.462cm^3 volume. There is muscle, Fat Layer (Subcutaneous Tissue) Exposed, and fascia exposed. There is no tunneling or undermining noted. There is a large amount of purulent drainage noted. Foul odor after cleansing was noted. The wound margin is epibole. There is no granulation within the wound bed. There is a large (67-100%) amount of necrotic tissue within the wound bed including Adherent Slough. The periwound skin appearance exhibited: Dry/Scaly, Erythema. The periwound skin appearance did not exhibit: Callus, Crepitus, Excoriation, Induration, Rash, Scarring, Maceration, Atrophie Blanche, Cyanosis, Ecchymosis, Hemosiderin Staining, Mottled, Pallor, Rubor. The surrounding wound skin color is noted with erythema with red streaks. Periwound temperature was noted as No Abnormality. The periwound has tenderness on palpation. Assessment Active Problems ICD-10 Pressure ulcer of sacral region, stage 4 Muscle weakness  (generalized) Plan Wound Cleansing: Wound #2 Medial Sacrum: Clean wound with Normal Saline. May shower with protection. Primary Wound Dressing: Wound #2 Medial Sacrum: Other: - Vashe moistened rolled gauze Secondary Dressing: Almgren, Hank E. ([id number]) Wound #2 Medial Sacrum: ABD pad XtraSorb - secured with tape Dressing Change Frequency: Wound #2 Medial Sacrum: Change dressing every day. - and as needed for soilage - please assess dressing integrity on every shift Follow-up Appointments: Wound #2 Medial Sacrum: Return Appointment in 2 weeks. Off-Loading: Wound #2 Medial Sacrum: Mattress - Please continue with air mattress Turn and reposition every 2 hours - Please assist patient with repositioning every 2 hours Other: - Patient may sit on the side of his bed for meals I discussed with the patient and his daughter over the phone today the possibility of a Wound VAC versus continuing with current therapy. With that being said to be honest the daughter somewhat concerned about the fact that the Wound VAC therapy would obviously require staff that were trained to perform this. Subsequently she's not sure that there is a sufficient staff to keep this at the facility right now. She would like to talk to them first before initiating therapy for the time being we gonna continue with the boss soaked gauze packing which seems to be doing well. We will see were things stand at follow- up. Please see above for specific wound care orders. We will see patient for re-evaluation in 2 week(s) here in the clinic. If anything worsens or changes patient will contact our office for additional recommendations. Electronic Signature(s) Signed: 06/15/2018 1:01:02 AM By: Lenda Kelp [name] Entered By: Lenda Kelp on 06/15/2018 00:23:03 [name] ([id number]) -------------------------------------------------------------------------------- ROS/PFSH Details Patient Name: [name], [name]  E. Date of Service: 06/13/2018 10:00 AM Medical Record Number: [id number] Patient Account Number: [id number] Date of Birth/Sex: November 15, 1926 (85 y.o. M) Treating RN: Curtis Sites Primary Care Provider: Eleonore Chiquito Other Clinician: Referring Provider: Eleonore Chiquito Treating Provider/Extender: Linwood Dibbles, Brodrick Curran Weeks in Treatment: 3 Information Obtained From Patient Wound History Do you currently have one or more open woundso Yes  How many open wounds do you currently haveo 1 Approximately how long have you had your woundso 02/21/2017 How have you been treating your wound(s) until nowo cleaning Has your wound(s) ever healed and then re-openedo No Have you had any lab work done in the past montho No Have you tested positive for an antibiotic resistant organism (MRSA, VRE)o No Have you tested positive for osteomyelitis (bone infection)o No Have you had any tests for circulation on your legso No Have you had other problems associated with your woundso Swelling Constitutional Symptoms (General Health) Complaints and Symptoms: Negative for: Fever; Chills Eyes Medical History: Negative for: Cataracts; Glaucoma; Optic Neuritis Past Medical History Notes: macular degeneration Hematologic/Lymphatic Medical History: Negative for: Anemia; Hemophilia; Human Immunodeficiency Virus; Lymphedema; Sickle Cell Disease Respiratory Complaints and Symptoms: No Complaints or Symptoms Medical History: Positive for: Chronic Obstructive Pulmonary Disease (COPD) Past Medical History Notes: continuous O2 Cardiovascular Complaints and Symptoms: No Complaints or Symptoms Medical History: Positive for: Congestive Heart Failure; Hypertension [name], [name] ([id number]) Genitourinary Medical History: Negative for: End Stage Renal Disease Musculoskeletal Medical History: Positive for: Osteoarthritis Negative for: Osteomyelitis Oncologic Medical History: Negative for: Received  Chemotherapy; Received Radiation Psychiatric Complaints and Symptoms: No Complaints or Symptoms Immunizations Pneumococcal Vaccine: Received Pneumococcal Vaccination: Yes Immunization Notes: up to date Implantable Devices Family and Social History Cancer: No; Diabetes: No; Heart Disease: Yes - Mother,Father; Hereditary Spherocytosis: No; Hypertension: Yes - Mother,Father; Kidney Disease: No; Lung Disease: No; Seizures: No; Stroke: No; Thyroid Problems: No; Tuberculosis: No; Former smoker - quit in 1996; Marital Status - Widowed; Alcohol Use: Never; Drug Use: No History; Caffeine Use: Daily; Financial Concerns: No; Food, Clothing or Shelter Needs: No; Support System Lacking: No; Transportation Concerns: No; Advanced Directives: No; Patient does not want information on Advanced Directives Physician Affirmation I have reviewed and agree with the above information. Electronic Signature(s) Signed: 06/15/2018 1:01:02 AM By: Lenda Kelp [name] Signed: 06/15/2018 5:03:44 PM By: Curtis Sites Entered By: Lenda Kelp on 06/15/2018 00:22:32 [name] ([id number]) -------------------------------------------------------------------------------- SuperBill Details Patient Name: Depaula, Christofer E. Date of Service: 06/13/2018 Medical Record Number: [id number] Patient Account Number: [id number] Date of Birth/Sex: 1927-02-16 (85 y.o. M) Treating RN: Curtis Sites Primary Care Provider: Eleonore Chiquito Other Clinician: Referring Provider: Eleonore Chiquito Treating Provider/Extender: Linwood Dibbles, Jalaina Salyers Weeks in Treatment: 3 Diagnosis Coding ICD-10 Codes Code Description L89.154 Pressure ulcer of sacral region, stage 4 M62.81 Muscle weakness (generalized) Facility Procedures CPT4 Code: [id number] Description: 99214 - WOUND CARE VISIT-LEV 4 EST PT Modifier: Quantity: 1 Physician Procedures CPT4 Code: [id number] Description: 99214 - WC PHYS LEVEL 4 - EST PT ICD-10 Diagnosis Description  L89.154 Pressure ulcer of sacral region, stage 4 M62.81 Muscle weakness (generalized) Modifier: Quantity: 1 Electronic Signature(s) Signed: 06/15/2018 1:01:02 AM By: Lenda Kelp [name] Entered By: Lenda Kelp on 06/13/2018 23:59:11

## 2018-06-27 ENCOUNTER — Encounter: Payer: Medicare Other | Attending: Physician Assistant | Admitting: Physician Assistant

## 2018-06-27 DIAGNOSIS — Z87891 Personal history of nicotine dependence: Secondary | ICD-10-CM | POA: Insufficient documentation

## 2018-06-27 DIAGNOSIS — I482 Chronic atrial fibrillation, unspecified: Secondary | ICD-10-CM | POA: Diagnosis not present

## 2018-06-27 DIAGNOSIS — Z6829 Body mass index (BMI) 29.0-29.9, adult: Secondary | ICD-10-CM | POA: Diagnosis not present

## 2018-06-27 DIAGNOSIS — E669 Obesity, unspecified: Secondary | ICD-10-CM | POA: Insufficient documentation

## 2018-06-27 DIAGNOSIS — Z96652 Presence of left artificial knee joint: Secondary | ICD-10-CM | POA: Diagnosis not present

## 2018-06-27 DIAGNOSIS — I509 Heart failure, unspecified: Secondary | ICD-10-CM | POA: Insufficient documentation

## 2018-06-27 DIAGNOSIS — J449 Chronic obstructive pulmonary disease, unspecified: Secondary | ICD-10-CM | POA: Diagnosis not present

## 2018-06-27 DIAGNOSIS — Z8249 Family history of ischemic heart disease and other diseases of the circulatory system: Secondary | ICD-10-CM | POA: Insufficient documentation

## 2018-06-27 DIAGNOSIS — I110 Hypertensive heart disease with heart failure: Secondary | ICD-10-CM | POA: Diagnosis not present

## 2018-06-27 DIAGNOSIS — L89154 Pressure ulcer of sacral region, stage 4: Secondary | ICD-10-CM | POA: Insufficient documentation

## 2018-06-27 DIAGNOSIS — H3530 Unspecified macular degeneration: Secondary | ICD-10-CM | POA: Insufficient documentation

## 2018-06-27 DIAGNOSIS — Z7901 Long term (current) use of anticoagulants: Secondary | ICD-10-CM | POA: Insufficient documentation

## 2018-06-27 IMAGING — DX DG CHEST 1V PORT
1 series · 1 of 1 positions shown · non-contrast
Comparison: February 11, 2017.

CLINICAL DATA: Hypoxia.  Atrial fibrillation.

EXAM:
PORTABLE CHEST 1 VIEW

[chest ap]
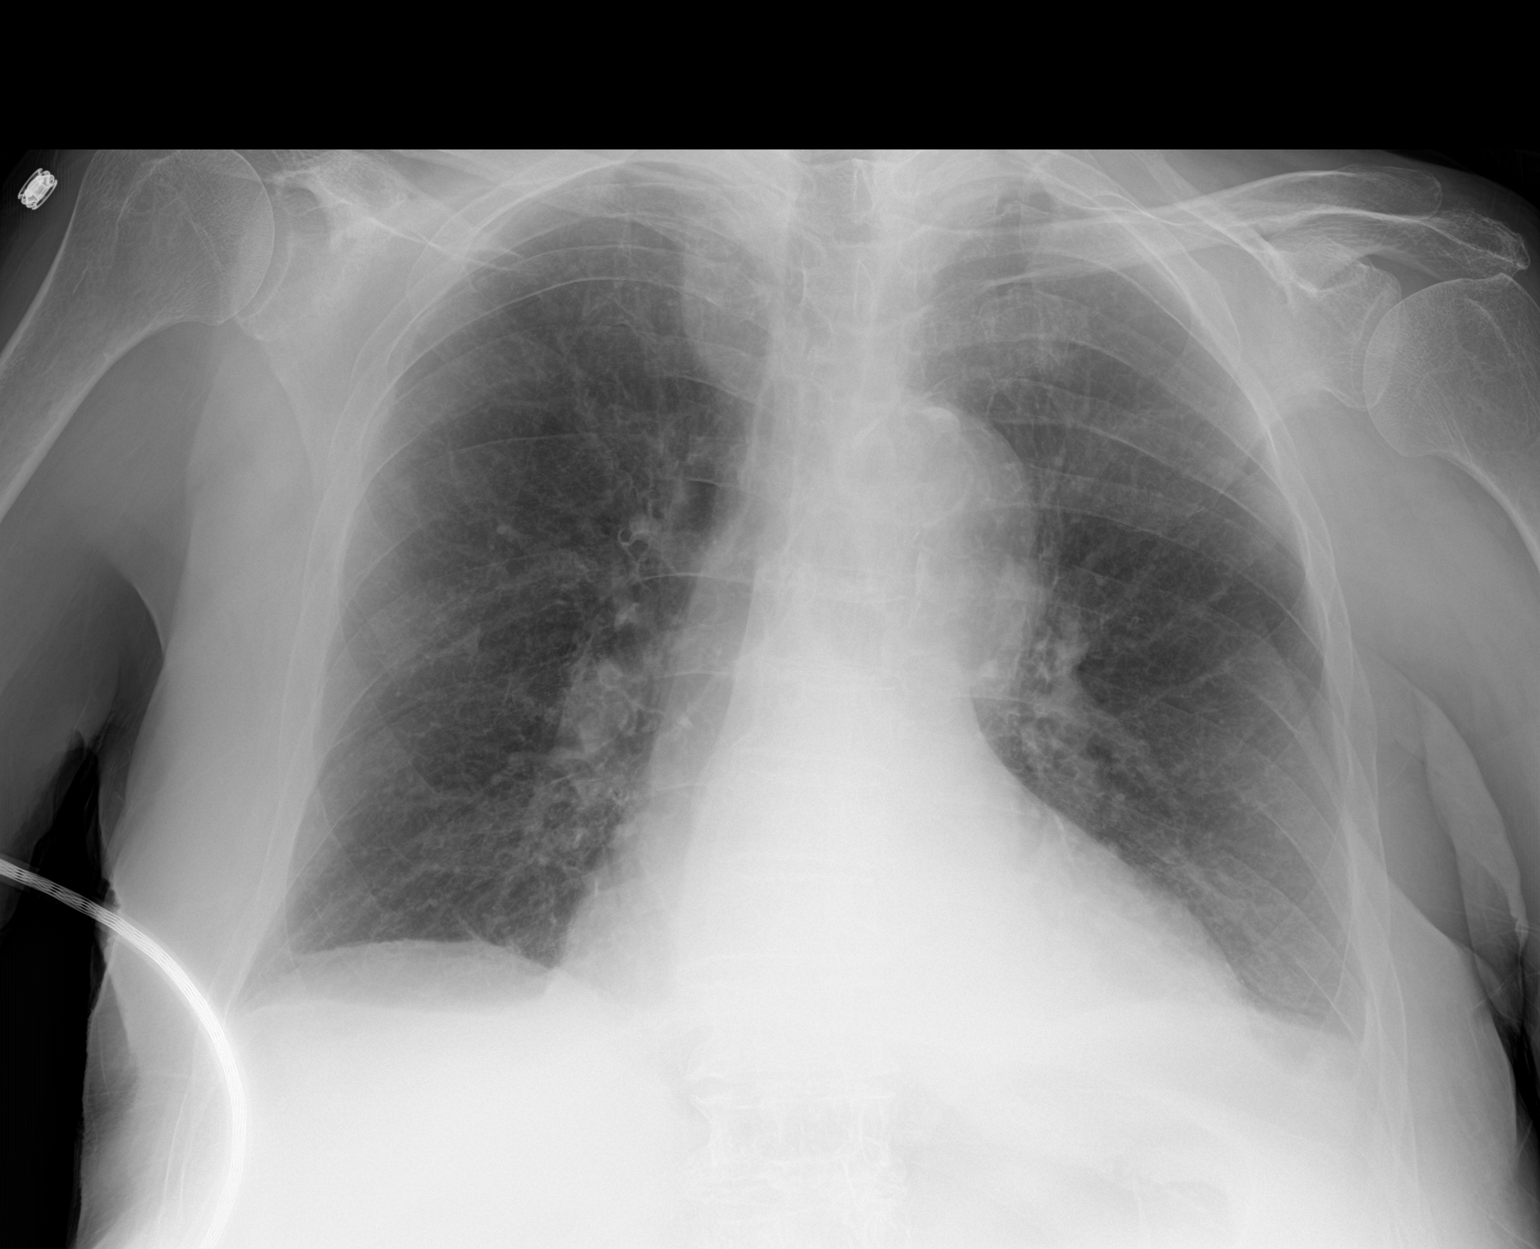

[1 of 1 positions shown; findings below may reference images not displayed]

FINDINGS: Lungs are mildly hyperexpanded. There is no edema or consolidation.
Heart is mildly enlarged with pulmonary vascularity within normal
limits. Prominence in the paratracheal regions is likely due to
great vessel prominence in this age group. There is aortic
atherosclerosis. Bones are osteoporotic.
IMPRESSION: No edema or consolidation. Stable cardiomegaly. There is aortic
atherosclerosis. Bones are osteoporotic.

Aortic Atherosclerosis ([id number]).

## 2018-07-02 NOTE — Progress Notes (Signed)
Gabor, ROBB BOBEK ([id number]) Visit Report for 06/27/2018 Chief Complaint Document Details Patient Name: [name], [name] Date of Service: 06/27/2018 1:15 PM Medical Record Number: [id number] Patient Account Number: [id number] Date of Birth/Sex: 1926/12/08 (85 y.o. M) Treating RN: Curtis Sites Primary Care Provider: Eleonore Chiquito Other Clinician: Referring Provider: Eleonore Chiquito Treating Provider/Extender: Linwood Dibbles, Dannae Kato Weeks in Treatment: 5 Information Obtained from: Patient Chief Complaint Sacral pressure ulcer Electronic Signature(s) Signed: 07/02/2018 7:15:48 AM By: Lenda Kelp [name] Entered By: Lenda Kelp on 06/27/2018 13:13:51 Harkin, Ladarion E. ([id number]) -------------------------------------------------------------------------------- HPI Details Patient Name: [name], [name] Date of Service: 06/27/2018 1:15 PM Medical Record Number: [id number] Patient Account Number: [id number] Date of Birth/Sex: 04/14/1927 (85 y.o. M) Treating RN: Curtis Sites Primary Care Provider: Eleonore Chiquito Other Clinician: Referring Provider: Eleonore Chiquito Treating Provider/Extender: Linwood Dibbles, Amilya Haver Weeks in Treatment: 5 History of Present Illness HPI Description: 04/20/17; this is an 85 year old man who is a bit of an unfortunate story. He was initially admitted to hospital from 9/10 through 9/14 for an elective left total knee replacement secondary to osteoarthritis. On 9/17 a nurse in the hospital noted non-blanchable erythema. The patient was discharged to [facility] skilled facility. An admission note by Dr. Lyn Hollingshead on 01/31/17 noted him unstageable sacral decubitus ulcer. The patient was then admitted to hospital from 9/24 through 10/8 now with a sacral wound felt to be infected. The patient had fever and chills. Required in his surgical IandD. Blood cultures grew Clostridium Ramosium and Bacteroides fragilis. I am not able to see the Bacteroides in care  everywhere today. Nevertheless the patient was seen by infectious disease and recommended for 6 weeks of ceftriaxone and Flagyl which should be completed. An x-ray in the hospital on 9/24 showed peroneal air which may be secondary to a decubitus ulcer no evidence for acute osseous or bone destructive change. An x-ray in the facility on 11/29 showed findings consistent with acute osteomyelitis of the distal coccyx.. Given the history here I suspect that this is probably not all acute and could have resulted at the time of infection the patient has a history of chronic atrial fibrillation on Xarelto, congestive heart failure with an EF of 40%, hypertension, BPH and osteoarthritis. He has been referred here for consideration of biopsy and culture of exposed coccyx It is difficult to follow the course of this patient's wound. Certainly he could have had underlying osteo in the hospital that wasn't well identified by plane xray and could have had more resistant organisms that were identified. I think we should wait for the results of the bone bx and cx today before initiating any antibiotics. Readmission: 05/23/18 patient presents today for reevaluation here in our clinic although it has been quite some time since he was last here. He is actually a patient of mine which I was seeing at [facility] nursing facility. Subsequently he has a significant stage for pressure ulcer to the gluteal region which coupled with his muscle weakness is causing a significant issue at this point as far as healing is concerned. It appears that this patient likely has an infection as well in the wound bed. He actually arrived today with no dressing in place and there was actually evidence of fecal material in the wound bed. His daughter was present for the evaluation today as well as a CNA from the facility. The patient has had this wound since I first began seeing him on October 2018. I last saw him for evaluation on January 05, 2018. This was that [facility] when I was still going to the nursing facilities. He said no on-site wound care from a provider other than that the facility since that time. He has not been on any recent antibiotics for the wound. The wound was significantly smaller when I last saw him measuring 3.5 x 2.5 x 3. Today this is much larger and deeper. There's also a significant amount of necrotic material which was not present when I last saw him. 05/30/18 on evaluation today patient appears to be doing excellent in regard to his sacral wound especially as compared to my last evaluation with him. He has been tolerating the dressing changes without complication and though there is some Slough noted there is really no significant bone noted exposed in the wound bed other than a small piece that is working itself out within some granulation tissue. Overall I feel like he's made excellent progress this wound appears to be night and day compared to what it looks like last week. 06/13/18 on evaluation today patient's sacral wound though large appears to be doing excellent. There's no sign of pressure injury and in general the tissue quality the sacral region is excellent with great granulation there's no evidence of infection whatsoever. There is no odor. He also states that his pain is down from a 9-10 out of 10 two 2/10 that most but he did not even really react to me cleaning the wound today. 06/27/18 on evaluation today patient actually appears to be doing rather well in regard to his sacral wound. The overall appearance is improved and I'm very pleased in this regard. With that being said he continues to have discomfort and his Pedrosa, [name] ChristmasVERETT E. ([id number]) daughter whom I did speak with today over the phone as well still has not had a chance to talk to the director of nursing regarding the Wound VAC because he and many of the staff have been out on sick leave due to the flu. Electronic  Signature(s) Signed: 07/02/2018 7:15:48 AM By: Lenda KelpStone III, Taffie Eckmann [name] Entered By: Lenda KelpStone III, Bulah Lurie on 06/27/2018 14:11:36 [name], [name] ChristmasEVERETT E. ([id number]) -------------------------------------------------------------------------------- Physical Exam Details Patient Name: [name], [name] Date of Service: 06/27/2018 1:15 PM Medical Record Number: [id number] Patient Account Number: [id number] Date of Birth/Sex: 22-Aug-1926 (85 y.o. M) Treating RN: Curtis Sitesorthy, Joanna Primary Care Provider: Eleonore ChiquitoKWIATKOWSKI, PETER Other Clinician: Referring Provider: Eleonore ChiquitoKWIATKOWSKI, PETER Treating Provider/Extender: STONE III, Kendell Sagraves Weeks in Treatment: 5 Constitutional Obese and well-hydrated in no acute distress. Respiratory normal breathing without difficulty. clear to auscultation bilaterally. Cardiovascular regular rate and rhythm with normal S1, S2. Psychiatric this patient is able to make decisions and demonstrates good insight into disease process. Alert and Oriented x 3. pleasant and cooperative. Notes Patient's wound bed currently shows good granulation there's no significant slough buildup at this time. Fortunately the patient seems to be doing very well in regard to the overall appearance of the wound this is good news he still very weak he is not able to get out of bed on his own or [facility]. Electronic Signature(s) Signed: 07/02/2018 7:15:48 AM By: Lenda KelpStone III, Malita Ignasiak [name] Entered By: Lenda KelpStone III, Haydan Wedig on 06/27/2018 14:12:16 [name], [name] ChristmasEVERETT E. ([id number]) -------------------------------------------------------------------------------- Physician Orders Details Patient Name: [name], [name] Date of Service: 06/27/2018 1:15 PM Medical Record Number: [id number] Patient Account Number: [id number] Date of Birth/Sex: 22-Aug-1926 (85 y.o. M) Treating RN: Curtis Sitesorthy, Joanna Primary Care Provider: Eleonore ChiquitoKWIATKOWSKI, PETER Other Clinician: Referring Provider: Eleonore ChiquitoKWIATKOWSKI, PETER  Treating  Provider/Extender: STONE III, Aleric Froelich Weeks in Treatment: 5 Verbal / Phone Orders: No Diagnosis Coding ICD-10 Coding Code Description L89.154 Pressure ulcer of sacral region, stage 4 M62.81 Muscle weakness (generalized) Wound Cleansing Wound #2 Medial Sacrum o Clean wound with Normal Saline. o May shower with protection. Primary Wound Dressing Wound #2 Medial Sacrum o Other: - Vashe moistened rolled gauze Secondary Dressing Wound #2 Medial Sacrum o ABD pad o XtraSorb - secured with tape Dressing Change Frequency Wound #2 Medial Sacrum o Change dressing every day. - and as needed for soilage - please assess dressing integrity on every shift Follow-up Appointments Wound #2 Medial Sacrum o Return Appointment in 2 weeks. Off-Loading Wound #2 Medial Sacrum o Mattress - Please continue with air mattress o Turn and reposition every 2 hours - Please assist patient with repositioning every 2 hours o Other: - Patient may sit on the side of his bed for meals Electronic Signature(s) Signed: 06/27/2018 4:52:44 PM By: Curtis Sites Signed: 07/02/2018 7:15:48 AM By: Lenda Kelp [name] Entered By: Curtis Sites on 06/27/2018 14:04:04 Ringold, Rolland E. ([id number]) -------------------------------------------------------------------------------- Problem List Details Patient Name: [name], [name] Date of Service: 06/27/2018 1:15 PM Medical Record Number: [id number] Patient Account Number: [id number] Date of Birth/Sex: 11/27/1926 (85 y.o. M) Treating RN: Curtis Sites Primary Care Provider: Eleonore Chiquito Other Clinician: Referring Provider: Eleonore Chiquito Treating Provider/Extender: Linwood Dibbles, Artie Takayama Weeks in Treatment: 5 Active Problems ICD-10 Evaluated Encounter Code Description Active Date Today Diagnosis L89.154 Pressure ulcer of sacral region, stage 4 05/23/2018 No Yes M62.81 Muscle weakness (generalized) 05/23/2018 No Yes Inactive Problems Resolved  Problems Electronic Signature(s) Signed: 07/02/2018 7:15:48 AM By: Lenda Kelp [name] Entered By: Lenda Kelp on 06/27/2018 13:13:48 Kiraly, Morgon E. ([id number]) -------------------------------------------------------------------------------- Progress Note Details Patient Name: [name], [name] Date of Service: 06/27/2018 1:15 PM Medical Record Number: [id number] Patient Account Number: [id number] Date of Birth/Sex: Apr 25, 1927 (85 y.o. M) Treating RN: Curtis Sites Primary Care Provider: Eleonore Chiquito Other Clinician: Referring Provider: Eleonore Chiquito Treating Provider/Extender: Linwood Dibbles, Janziel Hockett Weeks in Treatment: 5 Subjective Chief Complaint Information obtained from Patient Sacral pressure ulcer History of Present Illness (HPI) 04/20/17; this is an 85 year old man who is a bit of an unfortunate story. He was initially admitted to hospital from 9/10 through 9/14 for an elective left total knee replacement secondary to osteoarthritis. On 9/17 a nurse in the hospital noted non- blanchable erythema. The patient was discharged to [facility] skilled facility. An admission note by Dr. Lyn Hollingshead on 01/31/17 noted him unstageable sacral decubitus ulcer. The patient was then admitted to hospital from 9/24 through 10/8 now with a sacral wound felt to be infected. The patient had fever and chills. Required in his surgical IandD. Blood cultures grew Clostridium Ramosium and Bacteroides fragilis. I am not able to see the Bacteroides in care everywhere today. Nevertheless the patient was seen by infectious disease and recommended for 6 weeks of ceftriaxone and Flagyl which should be completed. An x-ray in the hospital on 9/24 showed peroneal air which may be secondary to a decubitus ulcer no evidence for acute osseous or bone destructive change. An x-ray in the facility on 11/29 showed findings consistent with acute osteomyelitis of the distal coccyx.. Given the history here I suspect  that this is probably not all acute and could have resulted at the time of infection the patient has a history of chronic atrial fibrillation on Xarelto, congestive heart failure with an EF of 40%, hypertension, BPH and osteoarthritis. He  has been referred here for consideration of biopsy and culture of exposed coccyx It is difficult to follow the course of this patient's wound. Certainly he could have had underlying osteo in the hospital that wasn't well identified by plane xray and could have had more resistant organisms that were identified. I think we should wait for the results of the bone bx and cx today before initiating any antibiotics. Readmission: 05/23/18 patient presents today for reevaluation here in our clinic although it has been quite some time since he was last here. He is actually a patient of mine which I [facility]. Subsequently he has a significant stage for pressure ulcer to the gluteal region which coupled with his muscle weakness is causing a significant issue at this point as far as healing is concerned. It appears that this patient likely has an infection as well in the wound bed. He actually arrived today with no dressing in place and there was actually evidence of fecal material in the wound bed. His daughter was present for the evaluation today as well as a CNA from the facility. The patient has had this wound since I first began seeing him on October 2018. I last saw him for evaluation on January 05, 2018. This was that [facility] when I was still going to the nursing facilities. He said no on-site wound care from a provider other than that the facility since that time. He has not been on any recent antibiotics for the wound. The wound was significantly smaller when I last saw him measuring 3.5 x 2.5 x 3. Today this is much larger and deeper. There's also a significant amount of necrotic material which was not present when I last  saw him. 05/30/18 on evaluation today patient appears to be doing excellent in regard to his sacral wound especially as compared to my last evaluation with him. He has been tolerating the dressing changes without complication and though there is some Slough noted there is really no significant bone noted exposed in the wound bed other than a small piece that is working itself out within some granulation tissue. Overall I feel like he's made excellent progress this wound appears to be night and day compared to what it looks like last week. 06/13/18 on evaluation today patient's sacral wound though large appears to be doing excellent. There's no sign of pressure injury and in general the tissue quality the sacral region is excellent with great granulation there's no evidence of infection [name], [name] ([id number]) whatsoever. There is no odor. He also states that his pain is down from a 9-10 out of 10 two 2/10 that most but he did not even really react to me cleaning the wound today. 06/27/18 on evaluation today patient actually appears to be doing rather well in regard to his sacral wound. The overall appearance is improved and I'm very pleased in this regard. With that being said he continues to have discomfort and his daughter whom I did speak with today over the phone as well still has not had a chance to talk to the director of nursing regarding the Wound VAC because he and many of the staff have been out on sick leave due to the flu. Patient History Information obtained from Patient. Family History Heart Disease - Mother,Father, Hypertension - Mother,Father, No family history of Cancer, Diabetes, Hereditary Spherocytosis, Kidney Disease, Lung Disease, Seizures, Stroke, Thyroid Problems, Tuberculosis. Social History Former smoker - quit in 1996, Marital Status -  Widowed, Alcohol Use - Never, Drug Use - No History, Caffeine Use - Daily. Medical History Eyes Denies history of Cataracts,  Glaucoma, Optic Neuritis Hematologic/Lymphatic Denies history of Anemia, Hemophilia, Human Immunodeficiency Virus, Lymphedema, Sickle Cell Disease Respiratory Patient has history of Chronic Obstructive Pulmonary Disease (COPD) Cardiovascular Patient has history of Congestive Heart Failure, Hypertension Genitourinary Denies history of End Stage Renal Disease Musculoskeletal Patient has history of Osteoarthritis Denies history of Osteomyelitis Oncologic Denies history of Received Chemotherapy, Received Radiation Medical And Surgical History Notes Eyes macular degeneration Respiratory continuous O2 Review of Systems (ROS) Constitutional Symptoms (General Health) Denies complaints or symptoms of Fever, Chills. Respiratory The patient has no complaints or symptoms. Cardiovascular The patient has no complaints or symptoms. Psychiatric The patient has no complaints or symptoms. [name], [name] ([id number]) Objective Constitutional Obese and well-hydrated in no acute distress. Vitals Time Taken: 1:20 PM, Height: 67 in, Weight: 186 lbs, BMI: 29.1, Temperature: 97.9 F, Pulse: 75 bpm, Respiratory Rate: 16 breaths/min, Blood Pressure: 90/42 mmHg. Respiratory normal breathing without difficulty. clear to auscultation bilaterally. Cardiovascular regular rate and rhythm with normal S1, S2. Psychiatric this patient is able to make decisions and demonstrates good insight into disease process. Alert and Oriented x 3. pleasant and cooperative. General Notes: Patient's wound bed currently shows good granulation there's no significant slough buildup at this time. Fortunately the patient seems to be doing very well in regard to the overall appearance of the wound this is good news he still very weak he is not able to get out of bed on his own or even with help at [facility] lift has to be utilized. Integumentary (Hair, Skin) Wound #2 status is Open. Original cause of wound was Pressure Injury.  The wound is located on the Medial Sacrum. The wound measures 7cm length x 5cm width x 4cm depth; 27.489cm^2 area and 109.956cm^3 volume. There is muscle, Fat Layer (Subcutaneous Tissue) Exposed, and fascia exposed. There is no tunneling or undermining noted. There is a large amount of purulent drainage noted. Foul odor after cleansing was noted. The wound margin is epibole. There is no granulation within the wound bed. There is a large (67-100%) amount of necrotic tissue within the wound bed including Adherent Slough. The periwound skin appearance exhibited: Dry/Scaly, Erythema. The periwound skin appearance did not exhibit: Callus, Crepitus, Excoriation, Induration, Rash, Scarring, Maceration, Atrophie Blanche, Cyanosis, Ecchymosis, Hemosiderin Staining, Mottled, Pallor, Rubor. The surrounding wound skin color is noted with erythema with red streaks. Periwound temperature was noted as No Abnormality. The periwound has tenderness on palpation. Assessment Active Problems ICD-10 Pressure ulcer of sacral region, stage 4 Muscle weakness (generalized) Plan Wound Cleansing: Wound #2 Medial Sacrum: Clean wound with Normal Saline. May shower with protection. Hoback, Axton E. ([id number]) Primary Wound Dressing: Wound #2 Medial Sacrum: Other: - Vashe moistened rolled gauze Secondary Dressing: Wound #2 Medial Sacrum: ABD pad XtraSorb - secured with tape Dressing Change Frequency: Wound #2 Medial Sacrum: Change dressing every day. - and as needed for soilage - please assess dressing integrity on every shift Follow-up Appointments: Wound #2 Medial Sacrum: Return Appointment in 2 weeks. Off-Loading: Wound #2 Medial Sacrum: Mattress - Please continue with air mattress Turn and reposition every 2 hours - Please assist patient with repositioning every 2 hours Other: - Patient may sit on the side of his bed for meals My suggestion at this point is gonna be that we go ahead and continue with the  Current wound care measures based on how we're doing at  this point. The patient is in agreement the plan. I did speak with his daughter as well she is also in agreement with this plan. We will subsequently see were things stand at follow-up. If anything changes they will let me know otherwise we will consider progressing to the Wound VAC next time assuming everything can be straightened out with the facility as far as figuring out a game plan is concerned with the patient's daughter and the staff. Please see above for specific wound care orders. We will see patient for re-evaluation in 2 week(s) here in the clinic. If anything worsens or changes patient will contact our office for additional recommendations. Electronic Signature(s) Signed: 07/02/2018 7:15:48 AM By: Lenda Kelp [name] Entered By: Lenda Kelp on 06/27/2018 14:12:49 [name], [name] Christmas ([id number]) -------------------------------------------------------------------------------- ROS/PFSH Details Patient Name: [name], [name] Date of Service: 06/27/2018 1:15 PM Medical Record Number: [id number] Patient Account Number: [id number] Date of Birth/Sex: 1926/10/19 (85 y.o. M) Treating RN: Curtis Sites Primary Care Provider: Eleonore Chiquito Other Clinician: Referring Provider: Eleonore Chiquito Treating Provider/Extender: Linwood Dibbles, Axl Rodino Weeks in Treatment: 5 Information Obtained From Patient Wound History Do you currently have one or more open woundso Yes How many open wounds do you currently haveo 1 Approximately how long have you had your woundso 02/21/2017 How have you been treating your wound(s) until nowo cleaning Has your wound(s) ever healed and then re-openedo No Have you had any lab work done in the past montho No Have you tested positive for an antibiotic resistant organism (MRSA, VRE)o No Have you tested positive for osteomyelitis (bone infection)o No Have you had any tests for circulation on your legso  No Have you had other problems associated with your woundso Swelling Constitutional Symptoms (General Health) Complaints and Symptoms: Negative for: Fever; Chills Eyes Medical History: Negative for: Cataracts; Glaucoma; Optic Neuritis Past Medical History Notes: macular degeneration Hematologic/Lymphatic Medical History: Negative for: Anemia; Hemophilia; Human Immunodeficiency Virus; Lymphedema; Sickle Cell Disease Respiratory Complaints and Symptoms: No Complaints or Symptoms Medical History: Positive for: Chronic Obstructive Pulmonary Disease (COPD) Past Medical History Notes: continuous O2 Cardiovascular Complaints and Symptoms: No Complaints or Symptoms Medical History: Positive for: Congestive Heart Failure; Hypertension [name], [name] ([id number]) Genitourinary Medical History: Negative for: End Stage Renal Disease Musculoskeletal Medical History: Positive for: Osteoarthritis Negative for: Osteomyelitis Oncologic Medical History: Negative for: Received Chemotherapy; Received Radiation Psychiatric Complaints and Symptoms: No Complaints or Symptoms Immunizations Pneumococcal Vaccine: Received Pneumococcal Vaccination: Yes Immunization Notes: up to date Implantable Devices Family and Social History Cancer: No; Diabetes: No; Heart Disease: Yes - Mother,Father; Hereditary Spherocytosis: No; Hypertension: Yes - Mother,Father; Kidney Disease: No; Lung Disease: No; Seizures: No; Stroke: No; Thyroid Problems: No; Tuberculosis: No; Former smoker - quit in 1996; Marital Status - Widowed; Alcohol Use: Never; Drug Use: No History; Caffeine Use: Daily; Financial Concerns: No; Food, Clothing or Shelter Needs: No; Support System Lacking: No; Transportation Concerns: No; Advanced Directives: No; Patient does not want information on Advanced Directives Physician Affirmation I have reviewed and agree with the above information. Electronic Signature(s) Signed: 06/27/2018  4:52:44 PM By: Curtis Sites Signed: 07/02/2018 7:15:48 AM By: Lenda Kelp [name] Entered By: Lenda Kelp on 06/27/2018 14:11:52 Meulemans, Aleksandr E. ([id number]) -------------------------------------------------------------------------------- SuperBill Details Patient Name: Brecheisen, Kevork E. Date of Service: 06/27/2018 Medical Record Number: [id number] Patient Account Number: [id number] Date of Birth/Sex: Sep 11, 1926 (85 y.o. M) Treating RN: Curtis Sites Primary Care Provider: Eleonore Chiquito Other Clinician: Referring Provider: Eleonore Chiquito Treating Provider/Extender: Linwood Dibbles, Frances Joynt  Weeks in Treatment: 5 Diagnosis Coding ICD-10 Codes Code Description L89.154 Pressure ulcer of sacral region, stage 4 M62.81 Muscle weakness (generalized) Facility Procedures CPT4 Code: [id number] Description: 99213 - WOUND CARE VISIT-LEV 3 EST PT Modifier: Quantity: 1 Physician Procedures CPT4 Code: [id number] Description: 99214 - WC PHYS LEVEL 4 - EST PT ICD-10 Diagnosis Description L89.154 Pressure ulcer of sacral region, stage 4 M62.81 Muscle weakness (generalized) Modifier: Quantity: 1 Electronic Signature(s) Signed: 06/27/2018 4:52:44 PM By: Curtis Sites Signed: 07/02/2018 7:15:48 AM By: Lenda Kelp [name] Entered By: Curtis Sites on 06/27/2018 14:18:10

## 2018-07-03 NOTE — Progress Notes (Signed)
Manon, TIANDRE ELLISTON ([id number]) Visit Report for 06/27/2018 Arrival Information Details Patient Name: [name], [name] Date of Service: 06/27/2018 1:15 PM Medical Record Number: [id number] Patient Account Number: [id number] Date of Birth/Sex: 10-20-1926 (85 y.o. M) Treating RN: Arnette Norris Primary Care Jerone Cudmore: Eleonore Chiquito Other Clinician: Referring Shay Bartoli: Eleonore Chiquito Treating Tieisha Darden/Extender: Linwood Dibbles, HOYT Weeks in Treatment: 5 Visit Information History Since Last Visit Added or deleted any medications: No Patient Arrived: Wheel Chair Any new allergies or adverse reactions: No Arrival Time: 13:28 Had a fall or experienced change in No Accompanied By: caregiver activities of daily living that may affect Transfer Assistance: Michiel Sites Lift risk of falls: Patient Identification Verified: Yes Signs or symptoms of abuse/neglect since last visito No Secondary Verification Process Completed: Yes Hospitalized since last visit: No Has Dressing in Place as Prescribed: No Pain Present Now: No Electronic Signature(s) Signed: 07/03/2018 9:36:27 AM By: Arnette Norris Entered By: Arnette Norris on 06/27/2018 13:28:49 Barbe, Dolton EMarland Kitchen ([id number]) -------------------------------------------------------------------------------- Clinic Level of Care Assessment Details Patient Name: Peedin, Emaad E. Date of Service: 06/27/2018 1:15 PM Medical Record Number: [id number] Patient Account Number: [id number] Date of Birth/Sex: 20-Aug-1926 (85 y.o. M) Treating RN: Curtis Sites Primary Care Zuhayr Deeney: Eleonore Chiquito Other Clinician: Referring Caelin Rosen: Eleonore Chiquito Treating Dayami Taitt/Extender: Linwood Dibbles, HOYT Weeks in Treatment: 5 Clinic Level of Care Assessment Items TOOL 4 Quantity Score []  - Use when only an EandM is performed on FOLLOW-UP visit 0 ASSESSMENTS - Nursing Assessment / Reassessment X - Reassessment of Co-morbidities (includes updates in patient status) 1  10 X- 1 5 Reassessment of Adherence to Treatment Plan ASSESSMENTS - Wound and Skin Assessment / Reassessment X - Simple Wound Assessment / Reassessment - one wound 1 5 []  - 0 Complex Wound Assessment / Reassessment - multiple wounds []  - 0 Dermatologic / Skin Assessment (not related to wound area) ASSESSMENTS - Focused Assessment []  - Circumferential Edema Measurements - multi extremities 0 []  - 0 Nutritional Assessment / Counseling / Intervention []  - 0 Lower Extremity Assessment (monofilament, tuning fork, pulses) []  - 0 Peripheral Arterial Disease Assessment (using hand held doppler) ASSESSMENTS - Ostomy and/or Continence Assessment and Care X - Incontinence Assessment and Management 1 10 []  - 0 Ostomy Care Assessment and Management (repouching, etc.) PROCESS - Coordination of Care X - Simple Patient / Family Education for ongoing care 1 15 []  - 0 Complex (extensive) Patient / Family Education for ongoing care X- 1 10 Staff obtains Chiropractor, Records, Test Results / Process Orders []  - 0 Staff telephones HHA, Nursing Homes / Clarify orders / etc []  - 0 Routine Transfer to another Facility (non-emergent condition) []  - 0 Routine Hospital Admission (non-emergent condition) []  - 0 New Admissions / Manufacturing engineer / Ordering NPWT, Apligraf, etc. []  - 0 Emergency Hospital Admission (emergent condition) X- 1 10 Simple Discharge Coordination Stauffer, Korie E. ([id number]) []  - 0 Complex (extensive) Discharge Coordination PROCESS - Special Needs []  - Pediatric / Minor Patient Management 0 []  - 0 Isolation Patient Management []  - 0 Hearing / Language / Visual special needs []  - 0 Assessment of Community assistance (transportation, D/C planning, etc.) []  - 0 Additional assistance / Altered mentation []  - 0 Support Surface(s) Assessment (bed, cushion, seat, etc.) INTERVENTIONS - Wound Cleansing / Measurement X - Simple Wound Cleansing - one wound 1 5 []  -  0 Complex Wound Cleansing - multiple wounds X- 1 5 Wound Imaging (photographs - any number of wounds) []  - 0 Wound Tracing (instead of photographs) X- 1  5 Simple Wound Measurement - one wound []  - 0 Complex Wound Measurement - multiple wounds INTERVENTIONS - Wound Dressings X - Small Wound Dressing one or multiple wounds 1 10 []  - 0 Medium Wound Dressing one or multiple wounds []  - 0 Large Wound Dressing one or multiple wounds X- 1 5 Application of Medications - topical []  - 0 Application of Medications - injection INTERVENTIONS - Miscellaneous []  - External ear exam 0 []  - 0 Specimen Collection (cultures, biopsies, blood, body fluids, etc.) []  - 0 Specimen(s) / Culture(s) sent or taken to Lab for analysis X- 1 10 Patient Transfer (multiple staff / Nurse, adultHoyer Lift / Similar devices) []  - 0 Simple Staple / Suture removal (25 or less) []  - 0 Complex Staple / Suture removal (26 or more) []  - 0 Hypo / Hyperglycemic Management (close monitor of Blood Glucose) []  - 0 Ankle / Brachial Index (ABI) - do not check if billed separately X- 1 5 Vital Signs Pepitone, Jakel E. ([id number]) Has the patient been seen at the hospital within the last three years: Yes Total Score: 110 Level Of Care: New/Established - Level 3 Electronic Signature(s) Signed: 06/27/2018 4:52:44 PM By: Curtis Sitesorthy, Joanna Entered By: Curtis Sitesorthy, Joanna on 06/27/2018 14:18:02 Grine, Tye EMarland Kitchen. ([id number]) -------------------------------------------------------------------------------- Encounter Discharge Information Details Patient Name: Urias, Governor E. Date of Service: 06/27/2018 1:15 PM Medical Record Number: [id number] Patient Account Number: [id number] Date of Birth/Sex: 1926-06-22 (85 y.o. M) Treating RN: Curtis Sitesorthy, Joanna Primary Care Chavela Justiniano: Eleonore ChiquitoKWIATKOWSKI, PETER Other Clinician: Referring Annaliza Zia: Eleonore ChiquitoKWIATKOWSKI, PETER Treating Valyn Latchford/Extender: Linwood DibblesSTONE III, HOYT Weeks in Treatment: 5 Encounter Discharge Information  Items Discharge Condition: Stable Ambulatory Status: Wheelchair Discharge Destination: Skilled Nursing Facility Telephoned: No Orders Sent: Yes Transportation: Private Auto Accompanied By: caregiver Schedule Follow-up Appointment: Yes Clinical Summary of Care: Electronic Signature(s) Signed: 06/27/2018 4:52:44 PM By: Curtis Sitesorthy, Joanna Entered By: Curtis Sitesorthy, Joanna on 06/27/2018 14:18:41 Axon, Johnchristopher EMarland Kitchen. ([id number]) -------------------------------------------------------------------------------- Lower Extremity Assessment Details Patient Name: Mathies, Chaos E. Date of Service: 06/27/2018 1:15 PM Medical Record Number: [id number] Patient Account Number: [id number] Date of Birth/Sex: 1926-06-22 (85 y.o. M) Treating RN: Arnette NorrisBiell, Kristina Primary Care Latiya Navia: Eleonore ChiquitoKWIATKOWSKI, PETER Other Clinician: Referring Kevontay Burks: Eleonore ChiquitoKWIATKOWSKI, PETER Treating Shannon Kirkendall/Extender: Linwood DibblesSTONE III, HOYT Weeks in Treatment: 5 Electronic Signature(s) Signed: 07/03/2018 9:36:27 AM By: Arnette NorrisBiell, Kristina Entered By: Arnette NorrisBiell, Kristina on 06/27/2018 13:33:48 Romas, Ameen E. ([id number]) -------------------------------------------------------------------------------- Multi Wound Chart Details Patient Name: Peplinski, Kylo E. Date of Service: 06/27/2018 1:15 PM Medical Record Number: [id number] Patient Account Number: [id number] Date of Birth/Sex: 1926-06-22 (85 y.o. M) Treating RN: Curtis Sitesorthy, Joanna Primary Care Taijon Vink: Eleonore ChiquitoKWIATKOWSKI, PETER Other Clinician: Referring Rudolph Dobler: Eleonore ChiquitoKWIATKOWSKI, PETER Treating Fares Ramthun/Extender: Linwood DibblesSTONE III, HOYT Weeks in Treatment: 5 Vital Signs Height(in): 67 Pulse(bpm): 75 Weight(lbs): 186 Blood Pressure(mmHg): 90/42 Body Mass Index(BMI): 29 Temperature(F): 97.9 Respiratory Rate 16 (breaths/min): Photos: [2:No Photos] [N/A:N/A] Wound Location: [2:Sacrum - Medial] [N/A:N/A] Wounding Event: [2:Pressure Injury] [N/A:N/A] Primary Etiology: [2:Pressure Ulcer] [N/A:N/A] Comorbid History: [2:Chronic  Obstructive Pulmonary Disease (COPD), Congestive Heart Failure, Hypertension, Osteoarthritis] [N/A:N/A] Date Acquired: [2:02/14/2017] [N/A:N/A] Weeks of Treatment: [2:5] [N/A:N/A] Wound Status: [2:Open] [N/A:N/A] Measurements L x W x D [2:7x5x4] [N/A:N/A] (cm) Area (cm) : [2:27.489] [N/A:N/A] Volume (cm) : [2:109.956] [N/A:N/A] % Reduction in Area: [2:-21.50%] [N/A:N/A] % Reduction in Volume: [2:-47.30%] [N/A:N/A] Classification: [2:Category/Stage IV] [N/A:N/A] Exudate Amount: [2:Large] [N/A:N/A] Exudate Type: [2:Purulent] [N/A:N/A] Exudate Color: [2:yellow, brown, green] [N/A:N/A] Foul Odor After Cleansing: [2:Yes] [N/A:N/A] Odor Anticipated Due to [2:No] [N/A:N/A] Product Use: Wound Margin: [2:Epibole] [N/A:N/A] Granulation Amount: [2:None Present (0%)] [N/A:N/A] Necrotic Amount: [2:Large (67-100%)] [  N/A:N/A] Exposed Structures: [2:Fascia: Yes Fat Layer (Subcutaneous Tissue) Exposed: Yes Muscle: Yes Tendon: No Joint: No Bone: No] [N/A:N/A] Epithelialization: [2:None] [N/A:N/A] Periwound Skin Texture: [2:Excoriation: No Induration: No] [N/A:N/A] Callus: No Crepitus: No Rash: No Scarring: No Periwound Skin Moisture: Dry/Scaly: Yes N/A N/A Maceration: No Periwound Skin Color: Erythema: Yes N/A N/A Atrophie Blanche: No Cyanosis: No Ecchymosis: No Hemosiderin Staining: No Mottled: No Pallor: No Rubor: No Erythema Location: Red Streaks N/A N/A Temperature: No Abnormality N/A N/A Tenderness on Palpation: Yes N/A N/A Wound Preparation: Ulcer Cleansing: N/A N/A Rinsed/Irrigated with Saline Topical Anesthetic Applied: Other: lidocaine 4% Treatment Notes Electronic Signature(s) Signed: 06/27/2018 4:52:44 PM By: Curtis Sites Entered By: Curtis Sites on 06/27/2018 13:53:53 Goetsch, Karna Christmas ([id number]) -------------------------------------------------------------------------------- Multi-Disciplinary Care Plan Details Patient Name: Mcmonigle, Hanan E. Date of Service:  06/27/2018 1:15 PM Medical Record Number: [id number] Patient Account Number: [id number] Date of Birth/Sex: [birth year] (85 y.o. M) Treating RN: Curtis Sites Primary Care Ashari Llewellyn: Eleonore Chiquito Other Clinician: Referring Lavergne Hiltunen: Eleonore Chiquito Treating Kamila Broda/Extender: Linwood Dibbles, HOYT Weeks in Treatment: 5 Active Inactive Abuse / Safety / Falls / Self Care Management Nursing Diagnoses: Impaired physical mobility Goals: Patient will not develop complications from immobility Date Initiated: 05/23/2018 Target Resolution Date: 08/19/2018 Goal Status: Active Interventions: Assess fall risk on admission and as needed Notes: Nutrition Nursing Diagnoses: Potential for alteratiion in Nutrition/Potential for imbalanced nutrition Goals: Patient/caregiver agrees to and verbalizes understanding of need to use nutritional supplements and/or vitamins as prescribed Date Initiated: 05/23/2018 Target Resolution Date: 08/19/2018 Goal Status: Active Interventions: Assess patient nutrition upon admission and as needed per policy Notes: Orientation to the Wound Care Program Nursing Diagnoses: Knowledge deficit related to the wound healing center program Goals: Patient/caregiver will verbalize understanding of the Wound Healing Center Program Date Initiated: 05/23/2018 Target Resolution Date: 08/19/2018 Goal Status: Active Interventions: Provide education on orientation to the wound center Maddix, Kiyoshi E. ([id number]) Notes: Pressure Nursing Diagnoses: Knowledge deficit related to causes and risk factors for pressure ulcer development Goals: Patient will remain free from development of additional pressure ulcers Date Initiated: 05/23/2018 Target Resolution Date: 08/19/2018 Goal Status: Active Interventions: Assess potential for pressure ulcer upon admission and as needed Notes: Wound/Skin Impairment Nursing Diagnoses: Impaired tissue integrity Goals: Ulcer/skin breakdown will heal  within 14 weeks Date Initiated: 05/23/2018 Target Resolution Date: 08/19/2018 Goal Status: Active Interventions: Assess patient/caregiver ability to obtain necessary supplies Assess patient/caregiver ability to perform ulcer/skin care regimen upon admission and as needed Assess ulceration(s) every visit Notes: Electronic Signature(s) Signed: 06/27/2018 4:52:44 PM By: Curtis Sites Entered By: Curtis Sites on 06/27/2018 13:53:42 Wicklund, Ines E. ([id number]) -------------------------------------------------------------------------------- Pain Assessment Details Patient Name: Polus, Haley E. Date of Service: 06/27/2018 1:15 PM Medical Record Number: [id number] Patient Account Number: [id number] Date of Birth/Sex: May 28, 1926 (85 y.o. M) Treating RN: Arnette Norris Primary Care Nerine Pulse: Eleonore Chiquito Other Clinician: Referring Darcy Cordner: Eleonore Chiquito Treating Keyshawn Hellwig/Extender: Linwood Dibbles, HOYT Weeks in Treatment: 5 Active Problems Location of Pain Severity and Description of Pain Patient Has Paino No Site Locations Pain Management and Medication Current Pain Management: Electronic Signature(s) Signed: 07/03/2018 9:36:27 AM By: Arnette Norris Entered By: Arnette Norris on 06/27/2018 13:28:57 Kronick, Karna Christmas ([id number]) -------------------------------------------------------------------------------- Patient/Caregiver Education Details Patient Name: Bonenberger, Rodolphe E. Date of Service: 06/27/2018 1:15 PM Medical Record Number: [id number] Patient Account Number: [id number] Date of Birth/Gender: [birth year] (85 y.o. M) Treating RN: Curtis Sites Primary Care Physician: Eleonore Chiquito Other Clinician: Referring Physician: Eleonore Chiquito Treating Physician/Extender: Linwood Dibbles, HOYT Weeks in Treatment: 5 Education Assessment  Education Provided To: Caregiver SNF nurses Education Topics Provided Wound/Skin Impairment: Handouts: Other: signed wound care  orders Methods: Clinical cytogeneticist) Signed: 06/27/2018 4:52:44 PM By: Curtis Sites Entered By: Curtis Sites on 06/27/2018 14:19:08 Rudman, Unnamed E. ([id number]) -------------------------------------------------------------------------------- Wound Assessment Details Patient Name: Ostrosky, Kadin E. Date of Service: 06/27/2018 1:15 PM Medical Record Number: [id number] Patient Account Number: [id number] Date of Birth/Sex: Aug 18, 1926 (85 y.o. M) Treating RN: Arnette Norris Primary Care Deaira Leckey: Eleonore Chiquito Other Clinician: Referring Helem Reesor: Eleonore Chiquito Treating Banita Lehn/Extender: Linwood Dibbles, HOYT Weeks in Treatment: 5 Wound Status Wound Number: 2 Primary Pressure Ulcer Etiology: Wound Location: Sacrum - Medial Wound Open Wounding Event: Pressure Injury Status: Date Acquired: 02/14/2017 Comorbid Chronic Obstructive Pulmonary Disease Weeks Of Treatment: 5 History: (COPD), Congestive Heart Failure, Clustered Wound: No Hypertension, Osteoarthritis Photos Photo Uploaded By: Arnette Norris on 06/27/2018 16:17:30 Wound Measurements Length: (cm) 7 Width: (cm) 5 Depth: (cm) 4 Area: (cm) 27.489 Volume: (cm) 109.956 % Reduction in Area: -21.5% % Reduction in Volume: -47.3% Epithelialization: None Tunneling: No Undermining: No Wound Description Classification: Category/Stage IV Wound Margin: Epibole Exudate Amount: Large Exudate Type: Purulent Exudate Color: yellow, brown, green Foul Odor After Cleansing: Yes Due to Product Use: No Slough/Fibrino Yes Wound Bed Granulation Amount: None Present (0%) Exposed Structure Necrotic Amount: Large (67-100%) Fascia Exposed: Yes Necrotic Quality: Adherent Slough Fat Layer (Subcutaneous Tissue) Exposed: Yes Tendon Exposed: No Muscle Exposed: Yes Necrosis of Muscle: No Joint Exposed: No Bone Exposed: No Macek, Kincade E. ([id number]) Periwound Skin Texture Texture Color No Abnormalities Noted: No No  Abnormalities Noted: No Callus: No Atrophie Blanche: No Crepitus: No Cyanosis: No Excoriation: No Ecchymosis: No Induration: No Erythema: Yes Rash: No Erythema Location: Red Streaks Scarring: No Hemosiderin Staining: No Mottled: No Moisture Pallor: No No Abnormalities Noted: No Rubor: No Dry / Scaly: Yes Maceration: No Temperature / Pain Temperature: No Abnormality Tenderness on Palpation: Yes Wound Preparation Ulcer Cleansing: Rinsed/Irrigated with Saline Topical Anesthetic Applied: Other: lidocaine 4%, Treatment Notes Wound #2 (Medial Sacrum) Notes Vashe moistened gauze, xtrasorb, abd secured with tape Electronic Signature(s) Signed: 07/03/2018 9:36:27 AM By: Arnette Norris Entered By: Arnette Norris on 06/27/2018 13:33:39 Shvartsman, Lovel E. ([id number]) -------------------------------------------------------------------------------- Vitals Details Patient Name: Traum, Javeion E. Date of Service: 06/27/2018 1:15 PM Medical Record Number: [id number] Patient Account Number: [id number] Date of Birth/Sex: 25-Oct-1926 (85 y.o. M) Treating RN: Arnette Norris Primary Care Naysa Puskas: Eleonore Chiquito Other Clinician: Referring Trinna Kunst: Eleonore Chiquito Treating Audrie Kuri/Extender: Linwood Dibbles, HOYT Weeks in Treatment: 5 Vital Signs Time Taken: 13:20 Temperature (F): 97.9 Height (in): 67 Pulse (bpm): 75 Weight (lbs): 186 Respiratory Rate (breaths/min): 16 Body Mass Index (BMI): 29.1 Blood Pressure (mmHg): 90/42 Reference Range: 80 - 120 mg / dl Airway Electronic Signature(s) Signed: 07/03/2018 9:36:27 AM By: Arnette Norris Entered By: Arnette Norris on 06/27/2018 13:29:23

## 2018-07-11 ENCOUNTER — Encounter: Payer: Medicare Other | Admitting: Physician Assistant

## 2018-07-11 DIAGNOSIS — L89154 Pressure ulcer of sacral region, stage 4: Secondary | ICD-10-CM | POA: Diagnosis not present

## 2018-07-15 NOTE — Progress Notes (Addendum)
Brundage, DHIRAJ SADD ([id number]) Visit Report for 07/11/2018 Arrival Information Details Patient Name: [name], [name] Date of Service: 07/11/2018 1:30 PM Medical Record Number: [id number] Patient Account Number: [id number] Date of Birth/Sex: [birth year] (85 y.o. M) Treating RN: Rodell Perna Primary Care Yliana Gravois: Eleonore Chiquito Other Clinician: Referring Sammy Cassar: Eleonore Chiquito Treating Pamla Pangle/Extender: Linwood Dibbles, HOYT Weeks in Treatment: 7 Visit Information History Since Last Visit Added or deleted any medications: No Patient Arrived: Wheel Chair Any new allergies or adverse reactions: No Arrival Time: 13:30 Had a fall or experienced change in No Accompanied By: caregiver activities of daily living that may affect Transfer Assistance: Michiel Sites Lift risk of falls: Signs or symptoms of abuse/neglect since last visito No Hospitalized since last visit: No Implantable device outside of the clinic excluding No cellular tissue based products placed in the center since last visit: Pain Present Now: No Electronic Signature(s) Signed: 07/12/2018 9:17:40 AM By: Rodell Perna Entered By: Rodell Perna on 07/11/2018 13:32:41 Westmoreland, Melburn EMarland Kitchen ([id number]) -------------------------------------------------------------------------------- Clinic Level of Care Assessment Details Patient Name: [name], [name] Date of Service: 07/11/2018 1:30 PM Medical Record Number: [id number] Patient Account Number: [id number] Date of Birth/Sex: 02-09-1927 (85 y.o. M) Treating RN: Curtis Sites Primary Care Shelsy Seng: Eleonore Chiquito Other Clinician: Referring Neka Bise: Eleonore Chiquito Treating Chelsea Pedretti/Extender: Linwood Dibbles, HOYT Weeks in Treatment: 7 Clinic Level of Care Assessment Items TOOL 4 Quantity Score []  - Use when only an EandM is performed on FOLLOW-UP visit 0 ASSESSMENTS - Nursing Assessment / Reassessment X - Reassessment of Co-morbidities (includes updates in patient status) 1 10 X- 1  5 Reassessment of Adherence to Treatment Plan ASSESSMENTS - Wound and Skin Assessment / Reassessment X - Simple Wound Assessment / Reassessment - one wound 1 5 []  - 0 Complex Wound Assessment / Reassessment - multiple wounds []  - 0 Dermatologic / Skin Assessment (not related to wound area) ASSESSMENTS - Focused Assessment []  - Circumferential Edema Measurements - multi extremities 0 []  - 0 Nutritional Assessment / Counseling / Intervention []  - 0 Lower Extremity Assessment (monofilament, tuning fork, pulses) []  - 0 Peripheral Arterial Disease Assessment (using hand held doppler) ASSESSMENTS - Ostomy and/or Continence Assessment and Care []  - Incontinence Assessment and Management 0 []  - 0 Ostomy Care Assessment and Management (repouching, etc.) PROCESS - Coordination of Care X - Simple Patient / Family Education for ongoing care 1 15 []  - 0 Complex (extensive) Patient / Family Education for ongoing care X- 1 10 Staff obtains Chiropractor, Records, Test Results / Process Orders []  - 0 Staff telephones HHA, Nursing Homes / Clarify orders / etc []  - 0 Routine Transfer to another Facility (non-emergent condition) []  - 0 Routine Hospital Admission (non-emergent condition) []  - 0 New Admissions / Manufacturing engineer / Ordering NPWT, Apligraf, etc. []  - 0 Emergency Hospital Admission (emergent condition) X- 1 10 Simple Discharge Coordination Cerro, Lebaron E. ([id number]) []  - 0 Complex (extensive) Discharge Coordination PROCESS - Special Needs []  - Pediatric / Minor Patient Management 0 []  - 0 Isolation Patient Management []  - 0 Hearing / Language / Visual special needs []  - 0 Assessment of Community assistance (transportation, D/C planning, etc.) []  - 0 Additional assistance / Altered mentation []  - 0 Support Surface(s) Assessment (bed, cushion, seat, etc.) INTERVENTIONS - Wound Cleansing / Measurement X - Simple Wound Cleansing - one wound 1 5 []  - 0 Complex Wound  Cleansing - multiple wounds X- 1 5 Wound Imaging (photographs - any number of wounds) []  - 0 Wound Tracing (instead of photographs)  X- 1 5 Simple Wound Measurement - one wound []  - 0 Complex Wound Measurement - multiple wounds INTERVENTIONS - Wound Dressings X - Small Wound Dressing one or multiple wounds 1 10 []  - 0 Medium Wound Dressing one or multiple wounds []  - 0 Large Wound Dressing one or multiple wounds X- 1 5 Application of Medications - topical []  - 0 Application of Medications - injection INTERVENTIONS - Miscellaneous []  - External ear exam 0 []  - 0 Specimen Collection (cultures, biopsies, blood, body fluids, etc.) []  - 0 Specimen(s) / Culture(s) sent or taken to Lab for analysis X- 1 10 Patient Transfer (multiple staff / Nurse, adult / Similar devices) []  - 0 Simple Staple / Suture removal (25 or less) []  - 0 Complex Staple / Suture removal (26 or more) []  - 0 Hypo / Hyperglycemic Management (close monitor of Blood Glucose) []  - 0 Ankle / Brachial Index (ABI) - do not check if billed separately X- 1 5 Vital Signs Pen, Travas E. ([id number]) Has the patient been seen at the hospital within the last three years: Yes Total Score: 100 Level Of Care: New/Established - Level 3 Electronic Signature(s) Signed: 07/11/2018 5:30:34 PM By: Curtis Sites Entered By: Curtis Sites on 07/11/2018 13:53:54 Seybold, Burhan EMarland Kitchen ([id number]) -------------------------------------------------------------------------------- Encounter Discharge Information Details Patient Name: [name] E. Date of Service: 07/11/2018 1:30 PM Medical Record Number: [id number] Patient Account Number: [id number] Date of Birth/Sex: 19-Sep-1926 (85 y.o. M) Treating RN: Curtis Sites Primary Care Lela Gell: Eleonore Chiquito Other Clinician: Referring Tawnie Ehresman: Eleonore Chiquito Treating Algis Lehenbauer/Extender: Linwood Dibbles, HOYT Weeks in Treatment: 7 Encounter Discharge Information Items Discharge  Condition: Stable Ambulatory Status: Wheelchair Discharge Destination: Home Transportation: Private Auto Accompanied By: caregiver Schedule Follow-up Appointment: Yes Clinical Summary of Care: Electronic Signature(s) Signed: 07/11/2018 5:30:34 PM By: Curtis Sites Entered By: Curtis Sites on 07/11/2018 14:22:40 Maland, Karna Christmas ([id number]) -------------------------------------------------------------------------------- Lower Extremity Assessment Details Patient Name: Harpole, Grantley E. Date of Service: 07/11/2018 1:30 PM Medical Record Number: [id number] Patient Account Number: [id number] Date of Birth/Sex: 1927/03/02 (85 y.o. M) Treating RN: Rodell Perna Primary Care Jaccob Czaplicki: Eleonore Chiquito Other Clinician: Referring Louie Meaders: Eleonore Chiquito Treating Larosa Rhines/Extender: Linwood Dibbles, HOYT Weeks in Treatment: 7 Electronic Signature(s) Signed: 07/12/2018 9:17:40 AM By: Rodell Perna Entered By: Rodell Perna on 07/11/2018 13:36:00 Adamik, Jaidyn E. ([id number]) -------------------------------------------------------------------------------- Multi Wound Chart Details Patient Name: Fiorini, Lenward E. Date of Service: 07/11/2018 1:30 PM Medical Record Number: [id number] Patient Account Number: [id number] Date of Birth/Sex: 1926/08/15 (85 y.o. M) Treating RN: Curtis Sites Primary Care Capricia Serda: Eleonore Chiquito Other Clinician: Referring Surah Pelley: Eleonore Chiquito Treating Jlyn Cerros/Extender: Linwood Dibbles, HOYT Weeks in Treatment: 7 Vital Signs Height(in): 67 Pulse(bpm): 66 Weight(lbs): 186 Blood Pressure(mmHg): 87/48 Body Mass Index(BMI): 29 Temperature(F): 98.6 Respiratory Rate 16 (breaths/min): Photos: [N/A:N/A] Wound Location: Sacrum - Medial N/A N/A Wounding Event: Pressure Injury N/A N/A Primary Etiology: Pressure Ulcer N/A N/A Comorbid History: Chronic Obstructive N/A N/A Pulmonary Disease (COPD), Congestive Heart Failure, Hypertension, Osteoarthritis Date  Acquired: 02/14/2017 N/A N/A Weeks of Treatment: 7 N/A N/A Wound Status: Open N/A N/A Measurements L x W x D 6x5x2.5 N/A N/A (cm) Area (cm) : 23.562 N/A N/A Volume (cm) : 58.905 N/A N/A % Reduction in Area: -4.20% N/A N/A % Reduction in Volume: 21.10% N/A N/A Classification: Category/Stage IV N/A N/A Exudate Amount: Large N/A N/A Exudate Type: Purulent N/A N/A Exudate Color: yellow, brown, green N/A N/A Foul Odor After Cleansing: Yes N/A N/A Odor Anticipated Due to No N/A N/A Product Use: Wound Margin: Epibole  N/A N/A Granulation Amount: None Present (0%) N/A N/A Necrotic Amount: Large (67-100%) N/A N/A Exposed Structures: Fascia: Yes N/A N/A Fat Layer (Subcutaneous Tissue) Exposed: Yes Quintanilla, Kiyaan E. ([id number]) Muscle: Yes Tendon: No Joint: No Bone: No Epithelialization: None N/A N/A Periwound Skin Texture: Excoriation: No N/A N/A Induration: No Callus: No Crepitus: No Rash: No Scarring: No Periwound Skin Moisture: Dry/Scaly: Yes N/A N/A Maceration: No Periwound Skin Color: Erythema: Yes N/A N/A Atrophie Blanche: No Cyanosis: No Ecchymosis: No Hemosiderin Staining: No Mottled: No Pallor: No Rubor: No Erythema Location: Red Streaks N/A N/A Temperature: No Abnormality N/A N/A Tenderness on Palpation: Yes N/A N/A Wound Preparation: Ulcer Cleansing: N/A N/A Rinsed/Irrigated with Saline Topical Anesthetic Applied: Other: lidocaine 4% Treatment Notes Electronic Signature(s) Signed: 07/11/2018 5:30:34 PM By: Curtis Sites Entered By: Curtis Sites on 07/11/2018 13:50:10 Omura, Karna Christmas ([id number]) -------------------------------------------------------------------------------- Multi-Disciplinary Care Plan Details Patient Name: Eagles, Altan E. Date of Service: 07/11/2018 1:30 PM Medical Record Number: [id number] Patient Account Number: [id number] Date of Birth/Sex: 07-23-1926 (85 y.o. M) Treating RN: Curtis Sites Primary Care Tametha Banning:  Eleonore Chiquito Other Clinician: Referring Kadince Boxley: Eleonore Chiquito Treating Gearold Wainer/Extender: Linwood Dibbles, HOYT Weeks in Treatment: 7 Active Inactive Electronic Signature(s) Signed: 07/24/2018 8:19:14 AM By: Elliot Gurney, BSN, RN, CWS, Kim RN, BSN Signed: 07/24/2018 10:44:46 AM By: Curtis Sites Previous Signature: 07/11/2018 5:30:34 PM Version By: Curtis Sites Entered By: Elliot Gurney BSN, RN, CWS, Kim on 07/24/2018 08:19:14 Arenas, Karna Christmas ([id number]) -------------------------------------------------------------------------------- Pain Assessment Details Patient Name: Mortell, Asencion E. Date of Service: 07/11/2018 1:30 PM Medical Record Number: [id number] Patient Account Number: [id number] Date of Birth/Sex: [birth year] (85 y.o. M) Treating RN: Rodell Perna Primary Care Kaushik Maul: Eleonore Chiquito Other Clinician: Referring Ivanna Kocak: Eleonore Chiquito Treating Abdel Effinger/Extender: Linwood Dibbles, HOYT Weeks in Treatment: 7 Active Problems Location of Pain Severity and Description of Pain Patient Has Paino No Site Locations Pain Management and Medication Current Pain Management: Electronic Signature(s) Signed: 07/12/2018 9:17:40 AM By: Rodell Perna Entered By: Rodell Perna on 07/11/2018 13:32:56 Klemmer, Karna Christmas ([id number]) -------------------------------------------------------------------------------- Patient/Caregiver Education Details Patient Name: Poplar, Hosteen E. Date of Service: 07/11/2018 1:30 PM Medical Record Number: [id number] Patient Account Number: [id number] Date of Birth/Gender: 1926-07-20 (85 y.o. M) Treating RN: Curtis Sites Primary Care Physician: Eleonore Chiquito Other Clinician: Referring Physician: Eleonore Chiquito Treating Physician/Extender: Skeet Simmer in Treatment: 7 Education Assessment Education Provided To: Patient and Caregiver SNF nurses via written orders Education Topics Provided Wound/Skin Impairment: Handouts: Other: signed wound care  orders Methods: Printed Electronic Signature(s) Signed: 07/11/2018 5:30:34 PM By: Curtis Sites Entered By: Curtis Sites on 07/11/2018 14:22:48 Gunderson, Fabyan EMarland Kitchen ([id number]) -------------------------------------------------------------------------------- Wound Assessment Details Patient Name: Chalker, Rudolpho E. Date of Service: 07/11/2018 1:30 PM Medical Record Number: [id number] Patient Account Number: [id number] Date of Birth/Sex: 13-Oct-1926 (85 y.o. M) Treating RN: Rodell Perna Primary Care Moksha Dorgan: Eleonore Chiquito Other Clinician: Referring Zeshan Sena: Eleonore Chiquito Treating Briseida Gittings/Extender: Linwood Dibbles, HOYT Weeks in Treatment: 7 Wound Status Wound Number: 2 Primary Pressure Ulcer Etiology: Wound Location: Sacrum - Medial Wound Open Wounding Event: Pressure Injury Status: Date Acquired: 02/14/2017 Comorbid Chronic Obstructive Pulmonary Disease Weeks Of Treatment: 7 History: (COPD), Congestive Heart Failure, Clustered Wound: No Hypertension, Osteoarthritis Photos Photo Uploaded By: Rodell Perna on 07/11/2018 13:46:53 Wound Measurements Length: (cm) 6 Width: (cm) 5 Depth: (cm) 2.5 Area: (cm) 23.562 Volume: (cm) 58.905 % Reduction in Area: -4.2% % Reduction in Volume: 21.1% Epithelialization: None Tunneling: No Undermining: No Wound Description Classification: Category/Stage IV Foul Odo Wound Margin: Epibole Due to P Exudate Amount:  Large Slough/F Exudate Type: Purulent Exudate Color: yellow, brown, green r After Cleansing: Yes roduct Use: No ibrino Yes Wound Bed Granulation Amount: None Present (0%) Exposed Structure Necrotic Amount: Large (67-100%) Fascia Exposed: Yes Necrotic Quality: Adherent Slough Fat Layer (Subcutaneous Tissue) Exposed: Yes Tendon Exposed: No Muscle Exposed: Yes Necrosis of Muscle: No Joint Exposed: No Bone Exposed: No Wey, Gaven E. ([id number]) Periwound Skin Texture Texture Color No Abnormalities Noted: No No  Abnormalities Noted: No Callus: No Atrophie Blanche: No Crepitus: No Cyanosis: No Excoriation: No Ecchymosis: No Induration: No Erythema: Yes Rash: No Erythema Location: Red Streaks Scarring: No Hemosiderin Staining: No Mottled: No Moisture Pallor: No No Abnormalities Noted: No Rubor: No Dry / Scaly: Yes Maceration: No Temperature / Pain Temperature: No Abnormality Tenderness on Palpation: Yes Wound Preparation Ulcer Cleansing: Rinsed/Irrigated with Saline Topical Anesthetic Applied: Other: lidocaine 4%, Electronic Signature(s) Signed: 07/12/2018 9:17:40 AM By: Rodell PernaScott, Dajea Entered By: Rodell PernaScott, Dajea on 07/11/2018 13:35:51 Accomando, Greysyn E. ([id number]) -------------------------------------------------------------------------------- Vitals Details Patient Name: Sinko, Khyri E. Date of Service: 07/11/2018 1:30 PM Medical Record Number: [id number] Patient Account Number: [id number] Date of Birth/Sex: 15-Feb-1927 (85 y.o. M) Treating RN: Rodell PernaScott, Dajea Primary Care Adarsh Mundorf: Eleonore ChiquitoKWIATKOWSKI, PETER Other Clinician: Referring Miko Sirico: Eleonore ChiquitoKWIATKOWSKI, PETER Treating Raenah Murley/Extender: Linwood DibblesSTONE III, HOYT Weeks in Treatment: 7 Vital Signs Time Taken: 13:30 Temperature (F): 98.6 Height (in): 67 Pulse (bpm): 66 Weight (lbs): 186 Respiratory Rate (breaths/min): 16 Body Mass Index (BMI): 29.1 Blood Pressure (mmHg): 87/48 Reference Range: 80 - 120 mg / dl Electronic Signature(s) Signed: 07/12/2018 9:17:40 AM By: Rodell PernaScott, Dajea Entered By: Rodell PernaScott, Dajea on 07/11/2018 13:33:16

## 2018-07-15 NOTE — Progress Notes (Signed)
Gwynne, RYEN VAZGUEZ ([id number]) Visit Report for 07/11/2018 Chief Complaint Document Details Patient Name: [name], [name] Date of Service: 07/11/2018 1:30 PM Medical Record Number: [id number] Patient Account Number: [id number] Date of Birth/Sex: March 31, 1927 (85 y.o. M) Treating RN: Curtis Sites Primary Care Provider: Eleonore Chiquito Other Clinician: Referring Provider: Eleonore Chiquito Treating Provider/Extender: Linwood Dibbles, Ashani Pumphrey Weeks in Treatment: 7 Information Obtained from: Patient Chief Complaint Sacral pressure ulcer Electronic Signature(s) Signed: 07/13/2018 5:15:29 PM By: Lenda Kelp [name] Entered By: Lenda Kelp on 07/11/2018 13:47:37 Odonnel, Nero E. ([id number]) -------------------------------------------------------------------------------- HPI Details Patient Name: [name], [name] Date of Service: 07/11/2018 1:30 PM Medical Record Number: [id number] Patient Account Number: [id number] Date of Birth/Sex: [birth year] (85 y.o. M) Treating RN: Curtis Sites Primary Care Provider: Eleonore Chiquito Other Clinician: Referring Provider: Eleonore Chiquito Treating Provider/Extender: Linwood Dibbles, Munir Victorian Weeks in Treatment: 7 History of Present Illness HPI Description: 04/20/17; this is an 85 year old man who is a bit of an unfortunate story. He was initially admitted to hospital from 9/10 through 9/14 for an elective left total knee replacement secondary to osteoarthritis. On 9/17 a nurse in the hospital noted non-blanchable erythema. The patient was discharged to [facility] skilled facility. An admission note by Dr. Lyn Hollingshead on 01/31/17 noted him unstageable sacral decubitus ulcer. The patient was then admitted to hospital from 9/24 through 10/8 now with a sacral wound felt to be infected. The patient had fever and chills. Required in his surgical IandD. Blood cultures grew Clostridium Ramosium and Bacteroides fragilis. I am not able to see the Bacteroides in care  everywhere today. Nevertheless the patient was seen by infectious disease and recommended for 6 weeks of ceftriaxone and Flagyl which should be completed. An x-ray in the hospital on 9/24 showed peroneal air which may be secondary to a decubitus ulcer no evidence for acute osseous or bone destructive change. An x-ray in the facility on 11/29 showed findings consistent with acute osteomyelitis of the distal coccyx.. Given the history here I suspect that this is probably not all acute and could have resulted at the time of infection the patient has a history of chronic atrial fibrillation on Xarelto, congestive heart failure with an EF of 40%, hypertension, BPH and osteoarthritis. He has been referred here for consideration of biopsy and culture of exposed coccyx It is difficult to follow the course of this patient's wound. Certainly he could have had underlying osteo in the hospital that wasn't well identified by plane xray and could have had more resistant organisms that were identified. I think we should wait for the results of the bone bx and cx today before initiating any antibiotics. Readmission: 05/23/18 patient presents today for reevaluation here in our clinic although it has been quite some time since he was last here. He is actually a patient of mine which I was seeing at [facility] nursing facility. Subsequently he has a significant stage for pressure ulcer to the gluteal region which coupled with his muscle weakness is causing a significant issue at this point as far as healing is concerned. It appears that this patient likely has an infection as well in the wound bed. He actually arrived today with no dressing in place and there was actually evidence of fecal material in the wound bed. His daughter was present for the evaluation today as well as a CNA from the facility. The patient has had this wound since I first began seeing him on October 2018. I last saw him for evaluation on January 05, 2018. This was that [place] when I was still going to the nursing facilities. He said no on-site wound care from a provider other than that the facility since that time. He has not been on any recent antibiotics for the wound. The wound was significantly smaller when I last saw him measuring 3.5 x 2.5 x 3. Today this is much larger and deeper. There's also a significant amount of necrotic material which was not present when I last saw him. 05/30/18 on evaluation today patient appears to be doing excellent in regard to his sacral wound especially as compared to my last evaluation with him. He has been tolerating the dressing changes without complication and though there is some Slough noted there is really no significant bone noted exposed in the wound bed other than a small piece that is working itself out within some granulation tissue. Overall I feel like he's made excellent progress this wound appears to be night and day compared to what it looks like last week. 06/13/18 on evaluation today patient's sacral wound though large appears to be doing excellent. There's no sign of pressure injury and in general the tissue quality the sacral region is excellent with great granulation there's no evidence of infection whatsoever. There is no odor. He also states that his pain is down from a 9-10 out of 10 two 2/10 that most but he did not even really react to me cleaning the wound today. 06/27/18 on evaluation today patient actually appears to be doing rather well in regard to his sacral wound. The overall appearance is improved and I'm very pleased in this regard. With that being said he continues to have discomfort and his daughter whom I did speak with today over the phone as well still has not had a chance to talk to the director of nursing [place], LEGEND TUMMINELLO. ([id number]) regarding the Wound VAC because he and many of the staff have been out on sick leave due to the flu. 07/11/18 on evaluation today  patient actually appears to be doing excellent in regard to his sacral words. He's been tolerating the dressing changes without complication overall I'm extremely pleased with how things appear today. He tells me that was her there would be a nurse practitioner coming to the facility that could see him on site. Again I explained that that is definitely something that is in the works although I'm not sure exactly which days she will be coming to this facility. Obviously depending on when and if we get this up and running that can definitely be a possibility for him to have first see him there and not have to come out to our office for evaluation and treatment. He would definitely like to consider this has with his daughter she states this was much easier on him. Electronic Signature(s) Signed: 07/13/2018 5:15:29 PM By: Lenda Kelp [name] Entered By: Lenda Kelp on 07/12/2018 23:33:00 [place], Karna Christmas ([id number]) -------------------------------------------------------------------------------- Physical Exam Details Patient Name: [name], [name] Date of Service: 07/11/2018 1:30 PM Medical Record Number: [id number] Patient Account Number: [id number] Date of Birth/Sex: 1926/07/27 (85 y.o. M) Treating RN: Curtis Sites Primary Care Provider: Eleonore Chiquito Other Clinician: Referring Provider: Eleonore Chiquito Treating Provider/Extender: STONE III, Juwaun Inskeep Weeks in Treatment: 7 Constitutional Well-nourished and well-hydrated in no acute distress. Respiratory normal breathing without difficulty. clear to auscultation bilaterally. Cardiovascular regular rate and rhythm with normal S1, S2. Psychiatric this patient is able to make decisions and demonstrates good insight into disease process.  Alert and Oriented x 3. pleasant and cooperative. Notes Patient's wound bed currently shows evidence of good granulation at this time. He has been tolerating the dressing changes without complication  which is excellent news. Overall very pleased with how things appear at this point. I honestly do not believe that it Wound VAC is gonna be necessary based on how well he's doing. Electronic Signature(s) Signed: 07/13/2018 5:15:29 PM By: Lenda Kelp [name] Entered By: Lenda Kelp on 07/12/2018 23:33:35 R[place], Karna Christmas ([id number]) -------------------------------------------------------------------------------- Physician Orders Details Patient Name: Hird, Chirstopher E. Date of Service: 07/11/2018 1:30 PM Medical Record Number: [id number] Patient Account Number: [id number] Date of Birth/Sex: 06-02-1926 (85 y.o. M) Treating RN: Curtis Sites Primary Care Provider: Eleonore Chiquito Other Clinician: Referring Provider: Eleonore Chiquito Treating Provider/Extender: Linwood Dibbles, Honour Schwieger Weeks in Treatment: 7 Verbal / Phone Orders: No Diagnosis Coding ICD-10 Coding Code Description L89.154 Pressure ulcer of sacral region, stage 4 M62.81 Muscle weakness (generalized) Wound Cleansing Wound #2 Medial Sacrum o Clean wound with Normal Saline. o May shower with protection. Primary Wound Dressing Wound #2 Medial Sacrum o Other: - Vashe moistened rolled gauze Secondary Dressing Wound #2 Medial Sacrum o ABD pad o XtraSorb - secured with tape Dressing Change Frequency Wound #2 Medial Sacrum o Change dressing every day. - and as needed for soilage - please assess dressing integrity on every shift Follow-up Appointments Wound #2 Medial Sacrum o Return Appointment in 2 weeks. - Cancel this appointment if patient is seen in the meantime by Wilkie Aye NP at facility o Other: - Patient will follow up with Wilkie Aye NP at [facility] when available Off-Loading Wound #2 Medial Sacrum o Mattress - Please continue with air mattress o Turn and reposition every 2 hours - Please assist patient with repositioning every 2 hours o Other: - Patient may sit on the side of his  bed for meals Electronic Signature(s) Signed: 07/11/2018 5:30:34 PM By: Curtis Sites Signed: 07/13/2018 5:15:29 PM By: Lenda Kelp [name] Entered By: Curtis Sites on 07/11/2018 13:58:44 [name], [name] ([id number]) [name], [name] ([id number]) -------------------------------------------------------------------------------- Problem List Details Patient Name: [name], [name] Date of Service: 07/11/2018 1:30 PM Medical Record Number: [id number] Patient Account Number: [id number] Date of Birth/Sex: Jul 04, 1926 (85 y.o. M) Treating RN: Curtis Sites Primary Care Provider: Eleonore Chiquito Other Clinician: Referring Provider: Eleonore Chiquito Treating Provider/Extender: Linwood Dibbles, Eziah Negro Weeks in Treatment: 7 Active Problems ICD-10 Evaluated Encounter Code Description Active Date Today Diagnosis L89.154 Pressure ulcer of sacral region, stage 4 05/23/2018 No Yes M62.81 Muscle weakness (generalized) 05/23/2018 No Yes Inactive Problems Resolved Problems Electronic Signature(s) Signed: 07/13/2018 5:15:29 PM By: Lenda Kelp [name] Entered By: Lenda Kelp on 07/11/2018 13:47:33 Saxby, Alexxander E. ([id number]) -------------------------------------------------------------------------------- Progress Note Details Patient Name: [name], [name] Date of Service: 07/11/2018 1:30 PM Medical Record Number: [id number] Patient Account Number: [id number] Date of Birth/Sex: [birth year] (85 y.o. M) Treating RN: Curtis Sites Primary Care Provider: Eleonore Chiquito Other Clinician: Referring Provider: Eleonore Chiquito Treating Provider/Extender: Linwood Dibbles, Arlita Buffkin Weeks in Treatment: 7 Subjective Chief Complaint Information obtained from Patient Sacral pressure ulcer History of Present Illness (HPI) 04/20/17; this is an 84 year old man who is a bit of an unfortunate story. He was initially admitted to hospital from 9/10 through 9/14 for an elective left total knee replacement secondary to  osteoarthritis. On 9/17 a nurse in the hospital noted non- blanchable erythema. The patient was discharged to [facility] skilled facility. An admission note by Dr. Lyn Hollingshead on 01/31/17 noted  him unstageable sacral decubitus ulcer. The patient was then admitted to hospital from 9/24 through 10/8 now with a sacral wound felt to be infected. The patient had fever and chills. Required in his surgical IandD. Blood cultures grew Clostridium Ramosium and Bacteroides fragilis. I am not able to see the Bacteroides in care everywhere today. Nevertheless the patient was seen by infectious disease and recommended for 6 weeks of ceftriaxone and Flagyl which should be completed. An x-ray in the hospital on 9/24 showed peroneal air which may be secondary to a decubitus ulcer no evidence for acute osseous or bone destructive change. An x-ray in the facility on 11/29 showed findings consistent with acute osteomyelitis of the distal coccyx.. Given the history here I suspect that this is probably not all acute and could have resulted at the time of infection the patient has a history of chronic atrial fibrillation on Xarelto, congestive heart failure with an EF of 40%, hypertension, BPH and osteoarthritis. He has been referred here for consideration of biopsy and culture of exposed coccyx It is difficult to follow the course of this patient's wound. Certainly he could have had underlying osteo in the hospital that wasn't well identified by plane xray and could have had more resistant organisms that were identified. I think we should wait for the results of the bone bx and cx today before initiating any antibiotics. Readmission: 05/23/18 patient presents today for reevaluation here in our clinic although it has been quite some time since he was last here. He is actually a patient of mine which I was seeing at [facility] nursing facility. Subsequently he has a significant stage for pressure ulcer to the gluteal region  which coupled with his muscle weakness is causing a significant issue at this point as far as healing is concerned. It appears that this patient likely has an infection as well in the wound bed. He actually arrived today with no dressing in place and there was actually evidence of fecal material in the wound bed. His daughter was present for the evaluation today as well as a CNA from the facility. The patient has had this wound since I first began seeing him on October 2018. I last saw him for evaluation on January 05, 2018. This was that [place] when I was still going to the nursing facilities. He said no on-site wound care from a provider other than that the facility since that time. He has not been on any recent antibiotics for the wound. The wound was significantly smaller when I last saw him measuring 3.5 x 2.5 x 3. Today this is much larger and deeper. There's also a significant amount of necrotic material which was not present when I last saw him. 05/30/18 on evaluation today patient appears to be doing excellent in regard to his sacral wound especially as compared to my last evaluation with him. He has been tolerating the dressing changes without complication and though there is some Slough noted there is really no significant bone noted exposed in the wound bed other than a small piece that is working itself out within some granulation tissue. Overall I feel like he's made excellent progress this wound appears to be night and day compared to what it looks like last week. 06/13/18 on evaluation today patient's sacral wound though large appears to be doing excellent. There's no sign of pressure injury and in general the tissue quality the sacral region is excellent with great granulation there's no evidence of infection [name], [name]  E. ([id number]) whatsoever. There is no odor. He also states that his pain is down from a 9-10 out of 10 two 2/10 that most but he did not even really react to  me cleaning the wound today. 06/27/18 on evaluation today patient actually appears to be doing rather well in regard to his sacral wound. The overall appearance is improved and I'm very pleased in this regard. With that being said he continues to have discomfort and his daughter whom I did speak with today over the phone as well still has not had a chance to talk to the director of nursing regarding the Wound VAC because he and many of the staff have been out on sick leave due to the flu. 07/11/18 on evaluation today patient actually appears to be doing excellent in regard to his sacral words. He's been tolerating the dressing changes without complication overall I'm extremely pleased with how things appear today. He tells me that was her there would be a nurse practitioner coming to the facility that could see him on site. Again I explained that that is definitely something that is in the works although I'm not sure exactly which days she will be coming to this facility. Obviously depending on when and if we get this up and running that can definitely be a possibility for him to have first see him there and not have to come out to our office for evaluation and treatment. He would definitely like to consider this has with his daughter she states this was much easier on him. Patient History Information obtained from Patient. Family History Heart Disease - Mother,Father, Hypertension - Mother,Father, No family history of Cancer, Diabetes, Hereditary Spherocytosis, Kidney Disease, Lung Disease, Seizures, Stroke, Thyroid Problems, Tuberculosis. Social History Former smoker - quit in 1996, Marital Status - Widowed, Alcohol Use - Never, Drug Use - No History, Caffeine Use - Daily. Medical History Eyes Denies history of Cataracts, Glaucoma, Optic Neuritis Hematologic/Lymphatic Denies history of Anemia, Hemophilia, Human Immunodeficiency Virus, Lymphedema, Sickle Cell Disease Respiratory Patient has  history of Chronic Obstructive Pulmonary Disease (COPD) Cardiovascular Patient has history of Congestive Heart Failure, Hypertension Genitourinary Denies history of End Stage Renal Disease Musculoskeletal Patient has history of Osteoarthritis Denies history of Osteomyelitis Oncologic Denies history of Received Chemotherapy, Received Radiation Medical And Surgical History Notes Eyes macular degeneration Respiratory continuous O2 Review of Systems (ROS) Constitutional Symptoms (General Health) Denies complaints or symptoms of Fever, Chills. Respiratory The patient has no complaints or symptoms. Cardiovascular The patient has no complaints or symptoms. Psychiatric The patient has no complaints or symptoms. [name], [name] ([id number]) Objective Constitutional Well-nourished and well-hydrated in no acute distress. Vitals Time Taken: 1:30 PM, Height: 67 in, Weight: 186 lbs, BMI: 29.1, Temperature: 98.6 F, Pulse: 66 bpm, Respiratory Rate: 16 breaths/min, Blood Pressure: 87/48 mmHg. Respiratory normal breathing without difficulty. clear to auscultation bilaterally. Cardiovascular regular rate and rhythm with normal S1, S2. Psychiatric this patient is able to make decisions and demonstrates good insight into disease process. Alert and Oriented x 3. pleasant and cooperative. General Notes: Patient's wound bed currently shows evidence of good granulation at this time. He has been tolerating the dressing changes without complication which is excellent news. Overall very pleased with how things appear at this point. I honestly do not believe that it Wound VAC is gonna be necessary based on how well he's doing. Integumentary (Hair, Skin) Wound #2 status is Open. Original cause of wound was Pressure Injury. The wound is located on  the Medial Sacrum. The wound measures 6cm length x 5cm width x 2.5cm depth; 23.562cm^2 area and 58.905cm^3 volume. There is muscle, Fat Layer (Subcutaneous  Tissue) Exposed, and fascia exposed. There is no tunneling or undermining noted. There is a large amount of purulent drainage noted. Foul odor after cleansing was noted. The wound margin is epibole. There is no granulation within the wound bed. There is a large (67-100%) amount of necrotic tissue within the wound bed including Adherent Slough. The periwound skin appearance exhibited: Dry/Scaly, Erythema. The periwound skin appearance did not exhibit: Callus, Crepitus, Excoriation, Induration, Rash, Scarring, Maceration, Atrophie Blanche, Cyanosis, Ecchymosis, Hemosiderin Staining, Mottled, Pallor, Rubor. The surrounding wound skin color is noted with erythema with red streaks. Periwound temperature was noted as No Abnormality. The periwound has tenderness on palpation. Assessment Active Problems ICD-10 Pressure ulcer of sacral region, stage 4 Muscle weakness (generalized) [name], [name] ([id number]) Plan Wound Cleansing: Wound #2 Medial Sacrum: Clean wound with Normal Saline. May shower with protection. Primary Wound Dressing: Wound #2 Medial Sacrum: Other: - Vashe moistened rolled gauze Secondary Dressing: Wound #2 Medial Sacrum: ABD pad XtraSorb - secured with tape Dressing Change Frequency: Wound #2 Medial Sacrum: Change dressing every day. - and as needed for soilage - please assess dressing integrity on every shift Follow-up Appointments: Wound #2 Medial Sacrum: Return Appointment in 2 weeks. - Cancel this appointment if patient is seen in the meantime by Wilkie Aye NP at facility Other: - Patient will follow up with Wilkie Aye NP at [facility] when available Off-Loading: Wound #2 Medial Sacrum: Mattress - Please continue with air mattress Turn and reposition every 2 hours - Please assist patient with repositioning every 2 hours Other: - Patient may sit on the side of his bed for meals At this point I'm recommending I discussed with his daughter as well as the phone  that we can definitely continue the tradition here in the clinic until such a time to the tomb to see the on-site nurse practitioner at [facility]. There in agreement with this plan. I'm gonna recommend not initiating the Wound VAC will continue with the Current wound care measures with the boss soaked gauze dressing. Please see above for specific wound care orders. We will see patient for re-evaluation in 2 week(s) here in the clinic. If anything worsens or changes patient will contact our office for additional recommendations. Electronic Signature(s) Signed: 07/13/2018 5:15:29 PM By: Lenda Kelp [name] Entered By: Lenda Kelp on 07/12/2018 23:34:45 Mckinlay, Karna Christmas ([id number]) -------------------------------------------------------------------------------- ROS/PFSH Details Patient Name: [name], [name] Date of Service: 07/11/2018 1:30 PM Medical Record Number: [id number] Patient Account Number: [id number] Date of Birth/Sex: 02/04/1927 (85 y.o. M) Treating RN: Curtis Sites Primary Care Provider: Eleonore Chiquito Other Clinician: Referring Provider: Eleonore Chiquito Treating Provider/Extender: Linwood Dibbles, Teaghan Melrose Weeks in Treatment: 7 Information Obtained From Patient Wound History Do you currently have one or more open woundso Yes How many open wounds do you currently haveo 1 Approximately how long have you had your woundso 02/21/2017 How have you been treating your wound(s) until nowo cleaning Has your wound(s) ever healed and then re-openedo No Have you had any lab work done in the past montho No Have you tested positive for an antibiotic resistant organism (MRSA, VRE)o No Have you tested positive for osteomyelitis (bone infection)o No Have you had any tests for circulation on your legso No Have you had other problems associated with your woundso Swelling Constitutional Symptoms (General Health) Complaints and Symptoms: Negative for:  Fever; Chills Eyes Medical  History: Negative for: Cataracts; Glaucoma; Optic Neuritis Past Medical History Notes: macular degeneration Hematologic/Lymphatic Medical History: Negative for: Anemia; Hemophilia; Human Immunodeficiency Virus; Lymphedema; Sickle Cell Disease Respiratory Complaints and Symptoms: No Complaints or Symptoms Medical History: Positive for: Chronic Obstructive Pulmonary Disease (COPD) Past Medical History Notes: continuous O2 Cardiovascular Complaints and Symptoms: No Complaints or Symptoms Medical History: Positive for: Congestive Heart Failure; Hypertension Krager, Aedyn E. ([id number]) Genitourinary Medical History: Negative for: End Stage Renal Disease Musculoskeletal Medical History: Positive for: Osteoarthritis Negative for: Osteomyelitis Oncologic Medical History: Negative for: Received Chemotherapy; Received Radiation Psychiatric Complaints and Symptoms: No Complaints or Symptoms Immunizations Pneumococcal Vaccine: Received Pneumococcal Vaccination: Yes Immunization Notes: up to date Implantable Devices No devices added Family and Social History Cancer: No; Diabetes: No; Heart Disease: Yes - Mother,Father; Hereditary Spherocytosis: No; Hypertension: Yes - Mother,Father; Kidney Disease: No; Lung Disease: No; Seizures: No; Stroke: No; Thyroid Problems: No; Tuberculosis: No; Former smoker - quit in 1996; Marital Status - Widowed; Alcohol Use: Never; Drug Use: No History; Caffeine Use: Daily; Financial Concerns: No; Food, Clothing or Shelter Needs: No; Support System Lacking: No; Transportation Concerns: No; Advanced Directives: No; Patient does not want information on Advanced Directives Physician Affirmation I have reviewed and agree with the above information. Electronic Signature(s) Signed: 07/13/2018 4:28:00 PM By: Curtis Sites Signed: 07/13/2018 5:15:29 PM By: Lenda Kelp [name] Entered By: Lenda Kelp on 07/12/2018 23:33:17 [name], Karna Christmas  ([id number]) -------------------------------------------------------------------------------- SuperBill Details Patient Name: Nachreiner, Freeman E. Date of Service: 07/11/2018 Medical Record Number: [id number] Patient Account Number: [id number] Date of Birth/Sex: Nov 17, 1926 (85 y.o. M) Treating RN: Curtis Sites Primary Care Provider: Eleonore Chiquito Other Clinician: Referring Provider: Eleonore Chiquito Treating Provider/Extender: Linwood Dibbles, Rodd Heft Weeks in Treatment: 7 Diagnosis Coding ICD-10 Codes Code Description L89.154 Pressure ulcer of sacral region, stage 4 M62.81 Muscle weakness (generalized) Facility Procedures CPT4 Code: 14709295 Description: 99213 - WOUND CARE VISIT-LEV 3 EST PT Modifier: Quantity: 1 Physician Procedures CPT4 Code: 7473403 Description: 99214 - WC PHYS LEVEL 4 - EST PT ICD-10 Diagnosis Description L89.154 Pressure ulcer of sacral region, stage 4 M62.81 Muscle weakness (generalized) Modifier: Quantity: 1 Electronic Signature(s) Signed: 07/13/2018 5:15:29 PM By: Lenda Kelp [name] Entered By: Lenda Kelp on 07/12/2018 23:34:55

## 2018-07-25 ENCOUNTER — Ambulatory Visit: Payer: Medicare Other | Admitting: Physician Assistant

## 2018-09-20 IMAGING — CT CT HEAD W/O CM
3 series · 14 of 47 positions shown, 16 images · non-contrast
Comparison: None.

CLINICAL DATA: History of sepsis and altered level of consciousness

EXAM:
CT HEAD WITHOUT CONTRAST
TECHNIQUE: Contiguous axial images were obtained from the base of the skull
through the vertex without intravenous contrast.

[Series 2: head wo · axial · 0.50mm/px · z∈[+1425,+1550]mm · 8 of 31 slices shown, 10 images]
[im 3/31  brain]
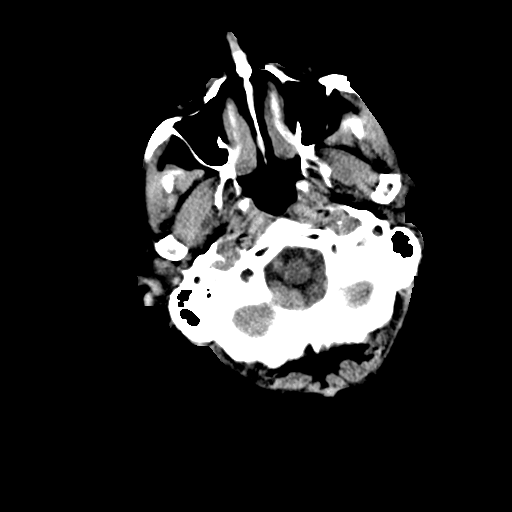
[im 3/31  bone]
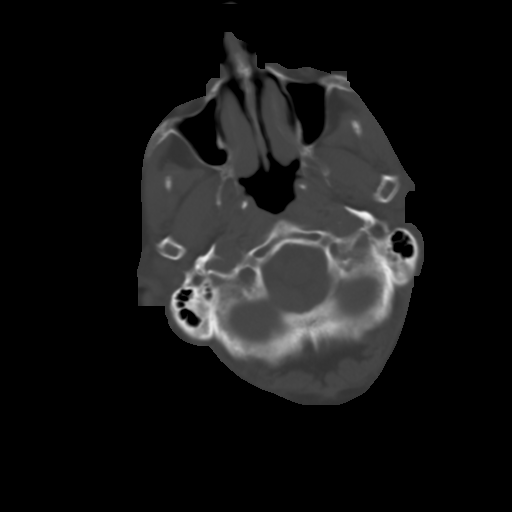
[im 7/31  brain]
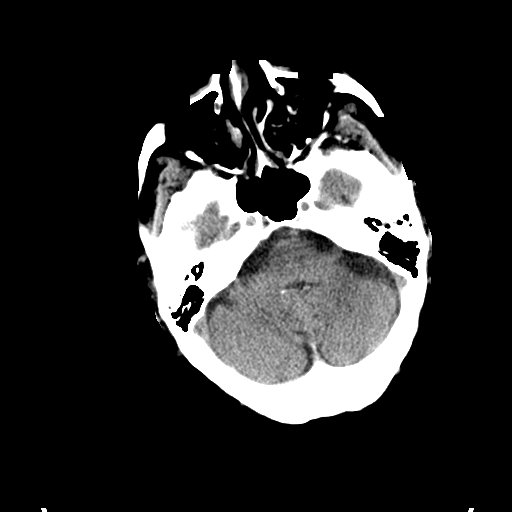
[im 10/31  brain]
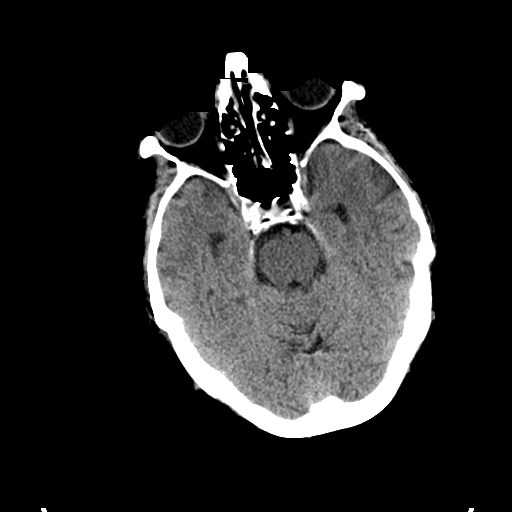
[im 14/31  brain]
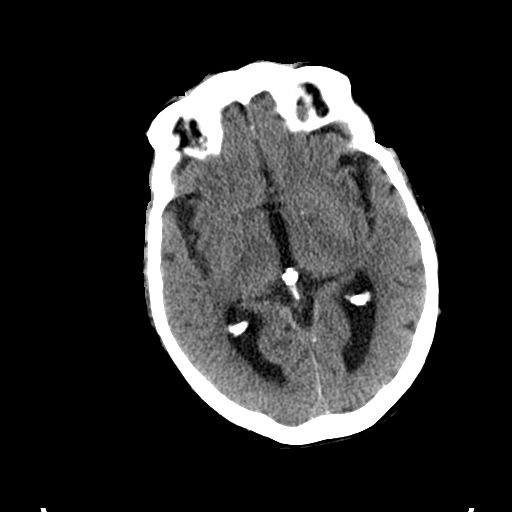
[im 17/31  brain]
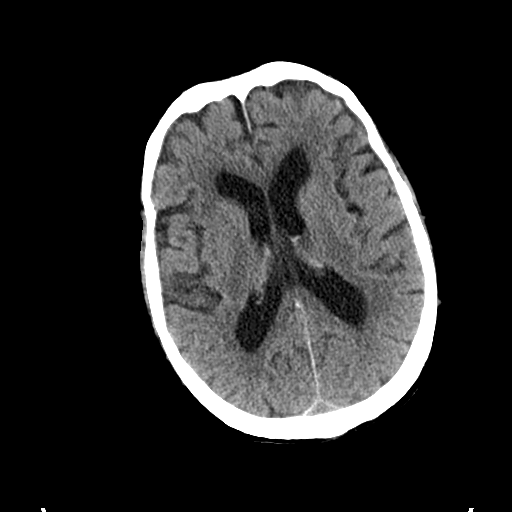
[im 17/31  bone]
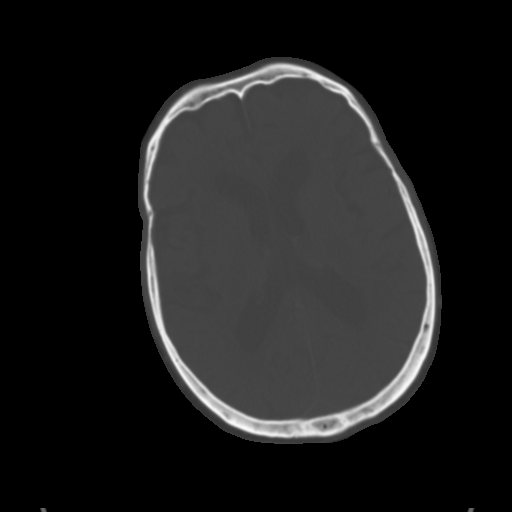
[im 21/31  brain]
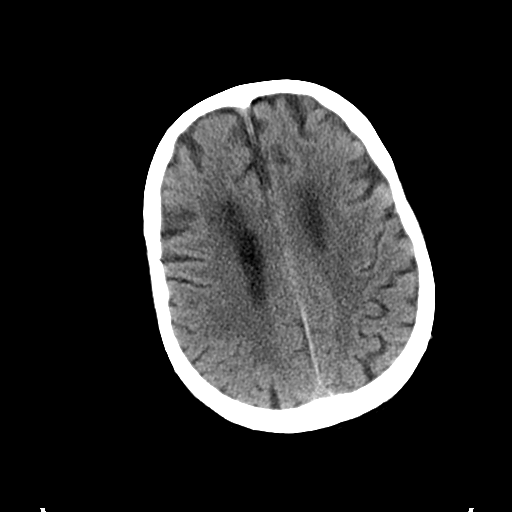
[im 24/31  brain]
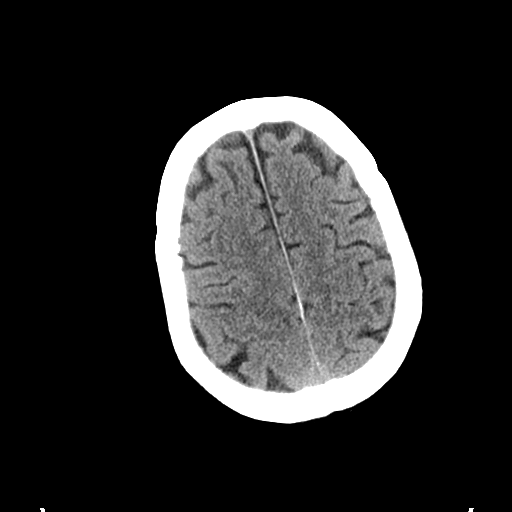
[im 28/31  brain]
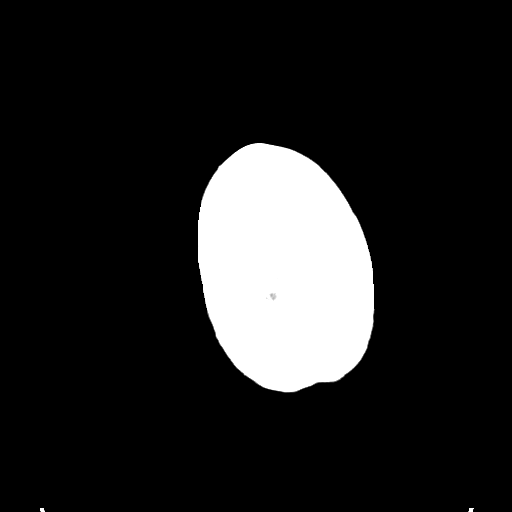

[Series 4: coronal soft tissue · coronal · 0.30mm/px · 3 of 82 slices shown]
[im 28/82  brain]
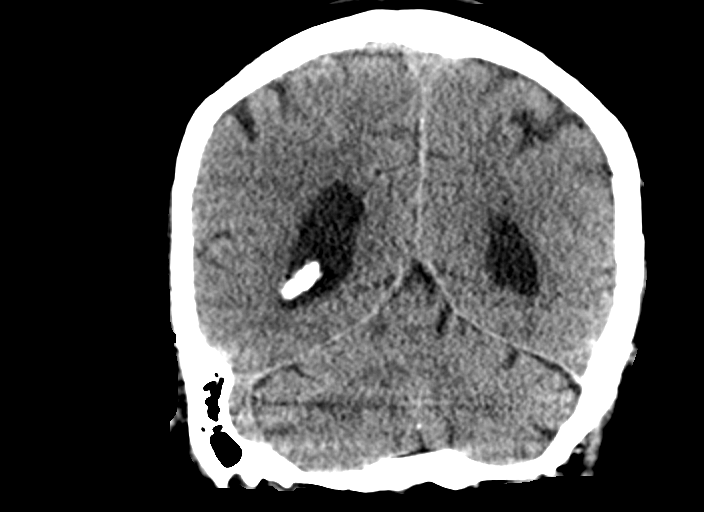
[im 37/82  brain]
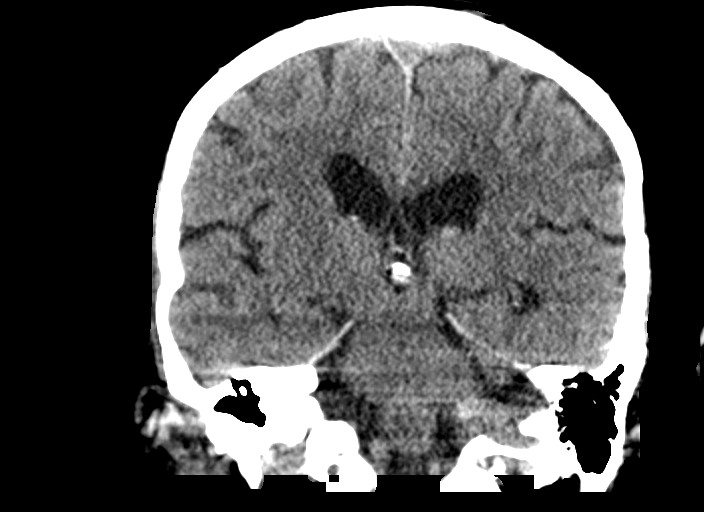
[im 46/82  brain]
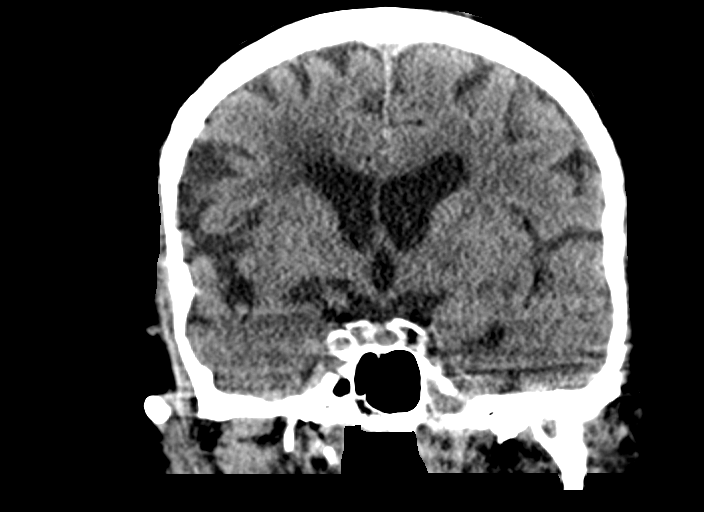

[Series 5: sagittal soft tissue · sagittal · 0.31mm/px · 3 of 83 slices shown]
[im 28/83  brain]
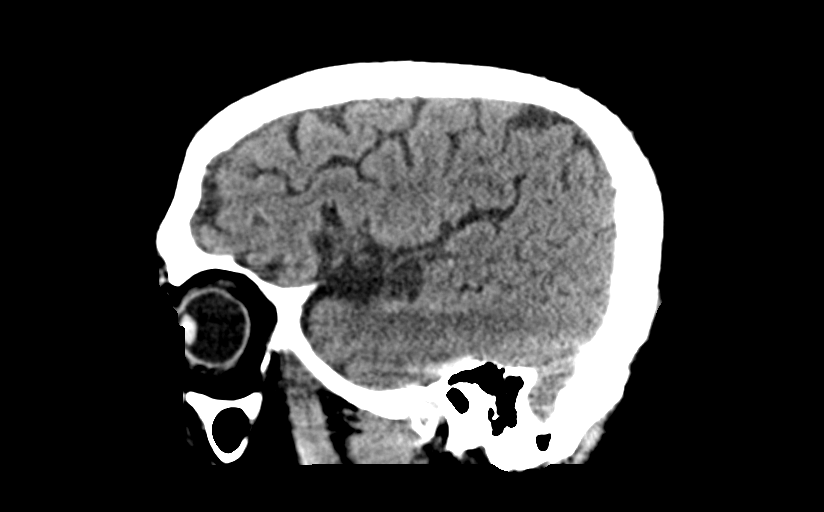
[im 42/83  brain]
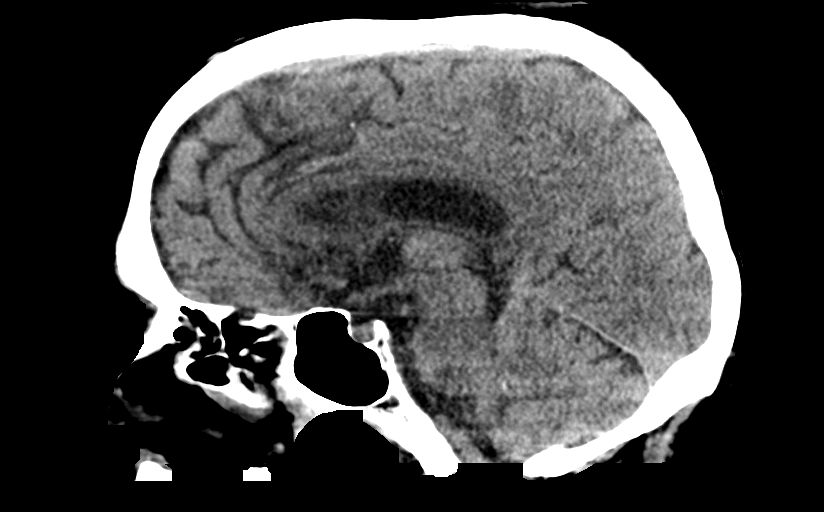
[im 55/83  brain]
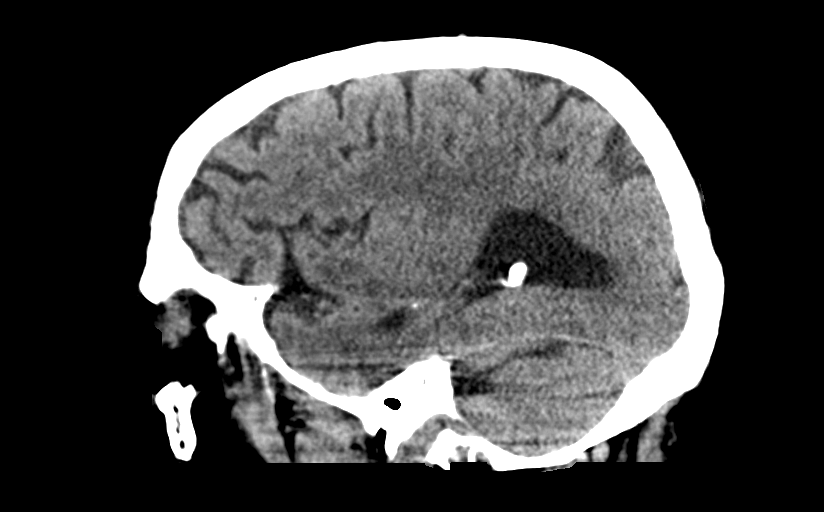

[14 of 47 positions shown; findings below may reference images not displayed]

FINDINGS: Brain: Mild atrophic changes and chronic white matter ischemic
changes are seen. No findings to suggest acute hemorrhage, acute
infarction or space-occupying mass lesion are noted.

Vascular: No hyperdense vessel or unexpected calcification.

Skull: Normal. Negative for fracture or focal lesion.

Sinuses/Orbits: No acute finding.

Other: None.
IMPRESSION: Chronic atrophic and ischemic changes without acute abnormality.

## 2018-10-03 IMAGING — DX DG CHEST 1V PORT
1 series · 2 of 2 positions shown · non-contrast
Comparison: 05/24/17

CLINICAL DATA: Cough and left-sided chest pain

EXAM:
PORTABLE CHEST 1 VIEW

[Series 1: chest ap · 0.14mm/px · 2 of 2 slices shown]
[im 1/2]
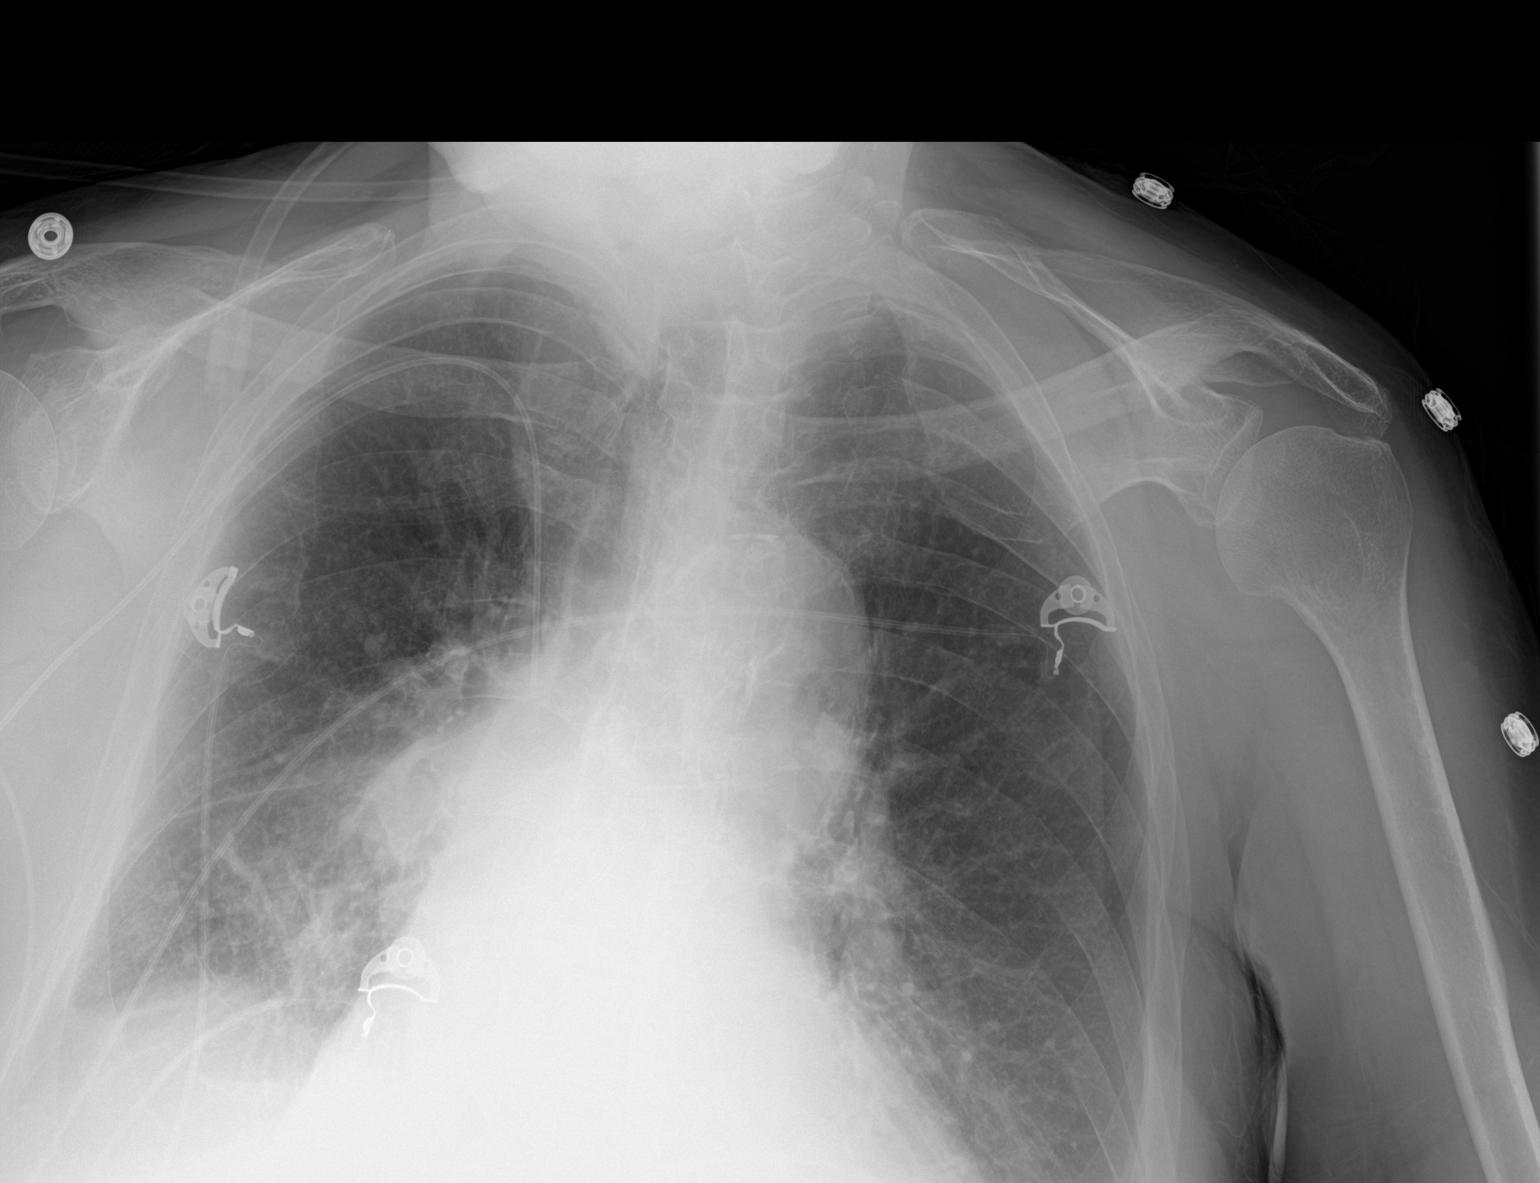
[im 2/2]
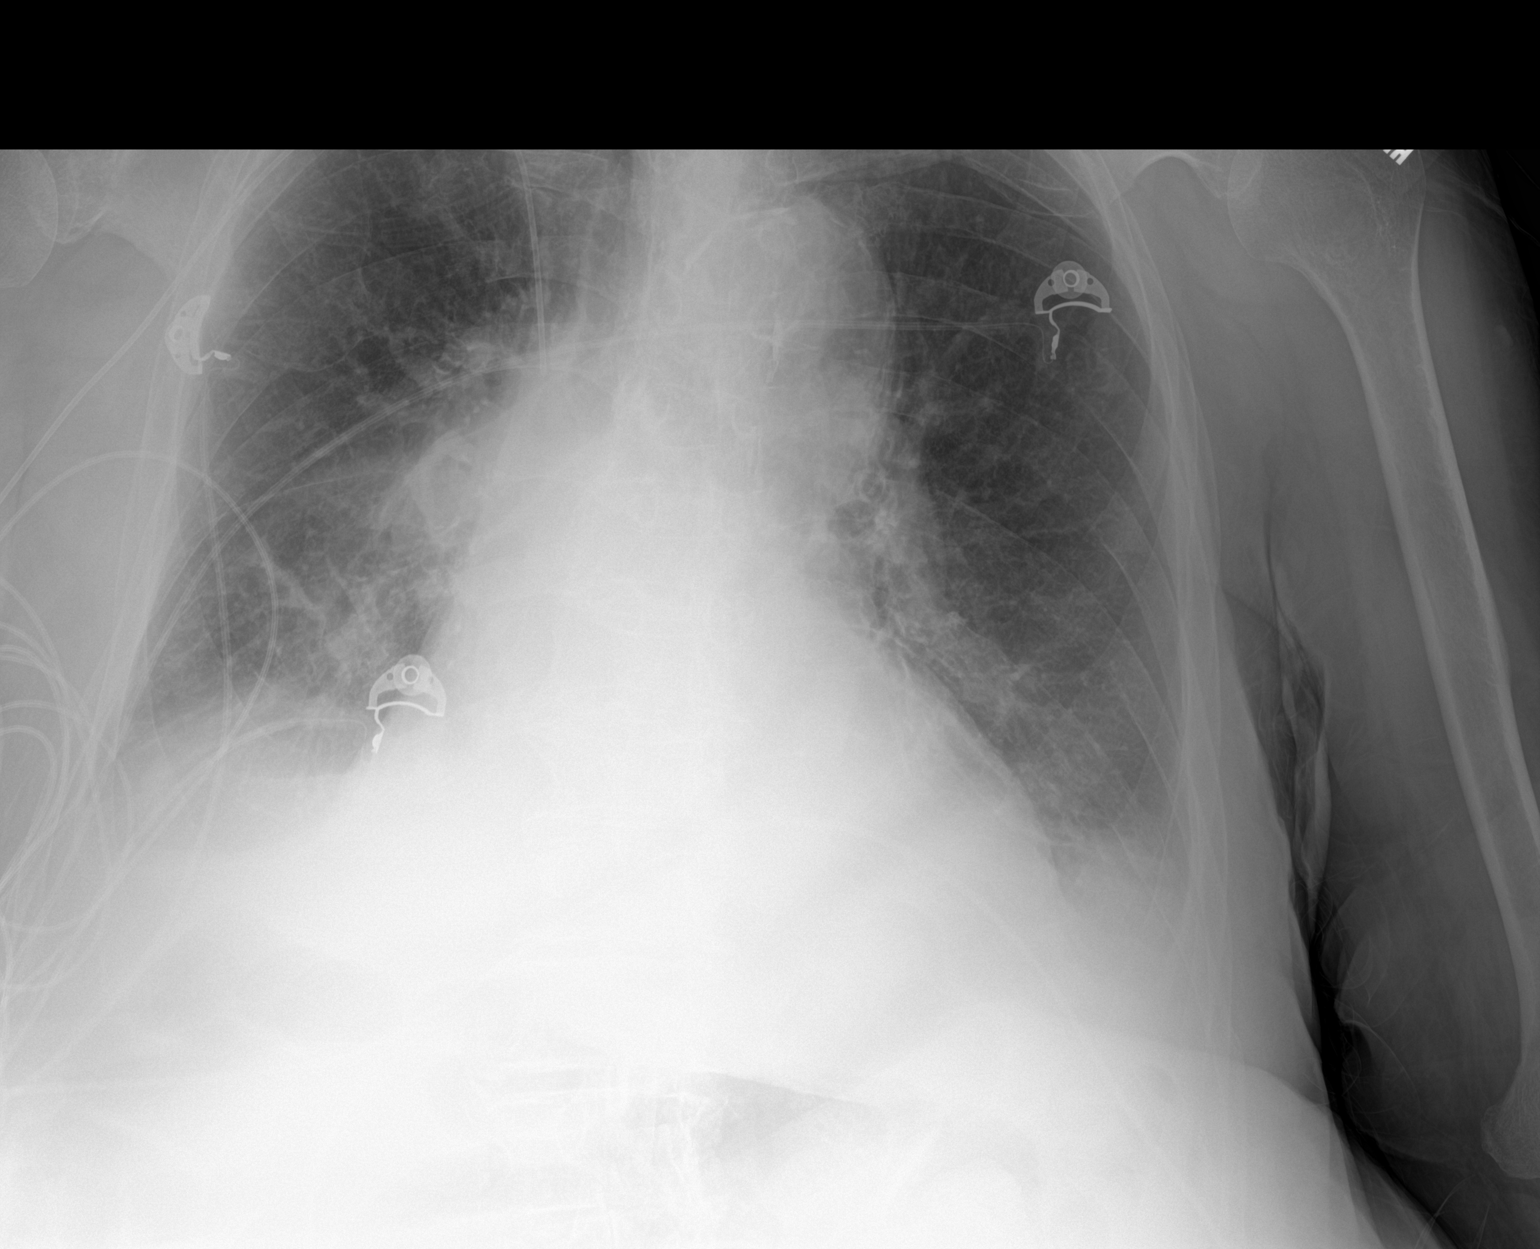

[2 of 2 positions shown; findings below may reference images not displayed]

FINDINGS: Cardiac shadow is enlarged but stable. Aortic calcifications are
again seen. Right-sided PICC line is again noted in the mid superior
vena cava. Vascular congestion is again seen with small bilateral
pleural effusions stable from the prior exam. No new focal
infiltrate is noted.
IMPRESSION: Changes of vascular congestion and bilateral effusions stable from
the prior study.

## 2018-11-10 ENCOUNTER — Ambulatory Visit: Payer: Medicare Other | Admitting: Physician Assistant

## 2018-11-13 ENCOUNTER — Encounter: Payer: Medicare Other | Attending: Physician Assistant | Admitting: Physician Assistant

## 2018-11-13 NOTE — Progress Notes (Addendum)
Sylla, [name] ([id number]) Visit Report for 11/13/2018 Allergy List Details Patient Name: [name], [name] Date of Service: 11/13/2018 3:00 PM Medical Record Number: [id number] Patient Account Number: [id number] Date of Birth/Sex: [birth year] (85 y.o. M) Treating RN: Cornell Barman Primary Care Baily Hovanec: Bluford Kaufmann Other Clinician: Referring Arvid Marengo: Bluford Kaufmann Treating Jacqui Headen/Extender: Melburn Hake, HOYT Weeks in Treatment: 0 Allergies Active Allergies NSAIDS (Non-Steroidal Anti-Inflammatory Drug) Vicodin morphine Allergy Notes Electronic Signature(s) Signed: 11/15/2018 12:14:37 AM By: Worthy Keeler [name] Previous Signature: 11/13/2018 4:00:27 PM Version By: Gretta Cool BSN, RN, CWS, Kim RN, BSN Previous Signature: 11/13/2018 3:58:51 PM Version By: Gretta Cool, BSN, RN, CWS, Kim RN, BSN Entered By: Worthy Keeler on 11/14/2018 23:50:33 [place], Jonna Coup ([id number]) -------------------------------------------------------------------------------- Wound Assessment Details Patient Name: [name], [name] Date of Service: 11/13/2018 3:00 PM Medical Record Number: [id number] Patient Account Number: [id number] Date of Birth/Sex: [birth year] (85 y.o. M) Treating RN: Cornell Barman Primary Care Nicol Herbig: Bluford Kaufmann Other Clinician: Referring Aadyn Buchheit: Bluford Kaufmann Treating Vinson Tietze/Extender: Melburn Hake, HOYT Weeks in Treatment: 0 Wound Status Wound Number: 3 Primary Etiology: Pressure Ulcer Wound Location: Sacrum - Midline Wound Status: Open Wounding Event: Pressure Injury Date Acquired: 05/28/2017 Weeks Of Treatment: 0 Clustered Wound: No Wound Measurements Length: (cm) 2.8 Width: (cm) 3 Depth: (cm) 2.5 Area: (cm) 6.597 Volume: (cm) 16.493 % Reduction in Area: % Reduction in Volume: Epithelialization: Medium (34-66%) Tunneling: Yes Location 1 Position (o'clock): 9 Maximum Distance: (cm) 2.5 Location 2 Position (o'clock): 11 Maximum Distance: (cm) 2.5 Wound  Description Classification: Category/Stage IV Wound Margin: Thickened Exudate Amount: Small Exudate Type: Purulent Exudate Color: yellow, brown, green Foul Odor After Cleansing: No Slough/Fibrino Yes Wound Bed Granulation Amount: Small (1-33%) Exposed Structure Granulation Quality: Red Fascia Exposed: No Necrotic Amount: Large (67-100%) Fat Layer (Subcutaneous Tissue) Exposed: Yes Necrotic Quality: Adherent Slough Tendon Exposed: No Muscle Exposed: Yes Necrosis of Muscle: No Joint Exposed: No Bone Exposed: No Assessment Notes Gae Gallop, LPN at [employer]) Signed: 11/13/2018 3:58:21 PM By: Gretta Cool, BSN, RN, CWS, Kim RN, BSN Entered By: Gretta Cool, BSN, RN, CWS, Kim on 11/13/2018 15:58:21 [place], Jonna Coup ([id number]) [place], LylesMarland Kitchen ([id number]) -------------------------------------------------------------------------------- Vitals Details Patient Name: [name], [name] Date of Service: 11/13/2018 3:00 PM Medical Record Number: [id number] Patient Account Number: [id number] Date of Birth/Sex: 1926/05/29 (85 y.o. M) Treating RN: Cornell Barman Primary Care Lain Tetterton: Bluford Kaufmann Other Clinician: Referring Emelio Schneller: Bluford Kaufmann Treating Blakeley Margraf/Extender: Melburn Hake, HOYT Weeks in Treatment: 0 Vital Signs Time Taken: 15:52 Temperature (F): 97.7 Pulse (bpm): 64 Respiratory Rate (breaths/min): 20 Blood Pressure (mmHg): 110/62 Reference Range: 80 - 120 mg / dl Airway Pulse Oximetry (%): 94 Electronic Signature(s) Signed: 11/13/2018 3:52:50 PM By: Gretta Cool, BSN, RN, CWS, Kim RN, BSN Entered By: Gretta Cool, BSN, RN, CWS, Kim on 11/13/2018 15:52:49

## 2018-11-14 ENCOUNTER — Ambulatory Visit: Payer: Medicare Other | Admitting: Physician Assistant

## 2018-11-28 NOTE — Progress Notes (Signed)
Saffran, [name] ([id number]) Visit Report for 11/13/2018 Chief Complaint Document Details Patient Name: [name], [name] Date of Service: 11/13/2018 3:00 PM Medical Record Number: [id number] Patient Account Number: [id number] Date of Birth/Sex: 05-Feb-1927 (85 y.o. M) Treating RN: Arnette NorrisBiell, Kristina Primary Care Provider: Eleonore ChiquitoKWIATKOWSKI, PETER Other Clinician: Referring Provider: Eleonore ChiquitoKWIATKOWSKI, PETER Treating Provider/Extender: Linwood DibblesSTONE III, HOYT Weeks in Treatment: 0 Information Obtained from: Patient Chief Complaint Sacral pressure ulcer Electronic Signature(s) Signed: 11/15/2018 12:14:37 AM By: Lenda KelpStone III, Hoyt [name] Entered By: Lenda KelpStone III, Hoyt on 11/14/2018 23:55:30 [name], [name] ([id number]) -------------------------------------------------------------------------------- HPI Details Patient Name: [name], [name] Date of Service: 11/13/2018 3:00 PM Medical Record Number: [id number] Patient Account Number: [id number] Date of Birth/Sex: 05-Feb-1927 (85 y.o. M) Treating RN: Arnette NorrisBiell, Kristina Primary Care Provider: Eleonore ChiquitoKWIATKOWSKI, PETER Other Clinician: Referring Provider: Eleonore ChiquitoKWIATKOWSKI, PETER Treating Provider/Extender: Linwood DibblesSTONE III, HOYT Weeks in Treatment: 0 History of Present Illness HPI Description: 04/20/17; this is an [age] year old man who is a bit of an unfortunate story. He was initially admitted to hospital from 9/10 through 9/14 for an elective left total knee replacement secondary to osteoarthritis. On 9/17 a nurse in the hospital noted non-blanchable erythema. The patient was discharged to [facility] skilled facility. An admission note by Dr. Lyn HollingsheadAlexander on 01/31/17 noted him unstageable sacral decubitus ulcer. The patient was then admitted to hospital from 9/24 through 10/8 now with a sacral wound felt to be infected. The patient had fever and chills. Required in his surgical IandD. Blood cultures grew Clostridium Ramosium and Bacteroides fragilis. I am not able to see the Bacteroides in care  everywhere today. Nevertheless the patient was seen by infectious disease and recommended for 6 weeks of ceftriaxone and Flagyl which should be completed. An x-ray in the hospital on 9/24 showed peroneal air which may be secondary to a decubitus ulcer no evidence for acute osseous or bone destructive change. An x-ray in the facility on 11/29 showed findings consistent with acute osteomyelitis of the distal coccyx.. Given the history here I suspect that this is probably not all acute and could have resulted at the time of infection the patient has a history of chronic atrial fibrillation on Xarelto, congestive heart failure with an EF of 40%, hypertension, BPH and osteoarthritis. He has been referred here for consideration of biopsy and culture of exposed coccyx It is difficult to follow the course of this patient's wound. Certainly he could have had underlying osteo in the hospital that wasn't well identified by plane xray and could have had more resistant organisms that were identified. I think we should wait for the results of the bone bx and cx today before initiating any antibiotics. Readmission: 05/23/18 patient presents today for reevaluation here in our clinic although it has been quite some time since he was last here. He is actually a patient of mine which I [facility]. Subsequently he has a significant stage for pressure ulcer to the gluteal region which coupled with his muscle weakness is causing a significant issue at this point as far as healing is concerned. It appears that this patient likely has an infection as well in the wound bed. He actually arrived today with no dressing in place and there was actually evidence of fecal material in the wound bed. His daughter was present for the evaluation today as well as a CNA from the facility. The patient has had this wound since I first began seeing him on October 2018. I last saw him for evaluation on January 05, 2018. [place] still going to the nursing facilities. He said no on-site wound care from a provider other than that the facility since that time. He has not been on any recent antibiotics for the wound. The wound was significantly smaller when I last saw him measuring 3.5 x 2.5 x 3. Today this is much larger and deeper. There's also a significant amount of necrotic material which was not present when I last saw him. 05/30/18 on evaluation today patient appears to be doing excellent in regard to his sacral wound especially as compared to my last evaluation with him. He has been tolerating the dressing changes without complication and though there is some Slough noted there is really no significant bone noted exposed in the wound bed other than a small piece that is working itself out within some granulation tissue. Overall I feel like he's made excellent progress this wound appears to be night and day compared to what it looks like last week. 06/13/18 on evaluation today patient's sacral wound though large appears to be doing excellent. There's no sign of pressure injury and in general the tissue quality the sacral region is excellent with great granulation there's no evidence of infection whatsoever. There is no odor. He also states that his pain is down from a 9-10 out of 10 two 2/10 that most but he did not even really react to me cleaning the wound today. 06/27/18 on evaluation today patient actually appears to be doing rather well in regard to his sacral wound. The overall appearance is improved and I'm very pleased in this regard. With that being said he continues to have discomfort and his daughter whom I did speak with today over the phone as well still has not had a chance to talk to the director of nursing Reisen, [name] ([id number]) regarding the Wound VAC because he and many of the staff have been out on sick leave due to the flu. 07/11/18 on evaluation today  patient actually appears to be doing excellent in regard to his sacral words. He's been tolerating the dressing changes without complication overall I'm extremely pleased with how things appear today. He tells me that was her there would be a nurse practitioner coming to the facility that could see him on site. Again I explained that that is definitely something that is in the works although I'm not sure exactly which days she will be coming to this facility. Obviously depending on when and if we get this up and running that can definitely be a possibility for him to have first see him there and not have to come out to our office for evaluation and treatment. He would definitely like to consider this has with his daughter she states this was much easier on him. 11/13/18 on evaluation today patient was seen for a telehealth visit due to the Covid-19 Virus national emergency. Subsequently he is and is still nursing facility and his nurse at the facility who performs the wound care on a daily basis, Alcario Droughtrica, is present during the evaluation. She did measure the wound dimensions for us as well today which was excellent and very helpful. Subsequently it appears that in the past week he has noted or rather his wound nurse has noted that the appearance of the wound bed was much darker and did not appear to be nearly as healthy as what it was in the past. Therefore she actually wanted to set up this visit in  order for me to try to take a look. Unfortunately the video quality is not excellent and I really was not able to tell to what extent the wound may be different although it's also been several months since I last saw his wound he was being cared for by I Rosita KeaKeisha Knight in the facility he was our nurse practitioner for skilled nursing facilities until just recently. Currently they have been using the Vashe which was previously instituted by myself when I last saw the patient in the clinic. Electronic  Signature(s) Signed: 11/15/2018 12:14:37 AM By: Lenda KelpStone III, Hoyt [name] Entered By: Lenda KelpStone III, Hoyt on 11/14/2018 23:55:38 Schiff, Karna ChristmasEVERETT E. ([id number]) -------------------------------------------------------------------------------- Physical Exam Details Patient Name: [name], [name] Date of Service: 11/13/2018 3:00 PM Medical Record Number: [id number] Patient Account Number: [id number] Date of Birth/Sex: [birth year] (85 y.o. M) Treating RN: Arnette NorrisBiell, Kristina Primary Care Provider: Eleonore ChiquitoKWIATKOWSKI, PETER Other Clinician: Referring Provider: Eleonore ChiquitoKWIATKOWSKI, PETER Treating Provider/Extender: Linwood DibblesSTONE III, HOYT Weeks in Treatment: 0 Constitutional supine blood pressure is within target range for patient.. pulse regular and within target range for patient.[name]. respirations regular, non-labored and within target range for patient.[name]. temperature within target range for patient.. Well-nourished and well-hydrated in no acute distress. Eyes conjunctiva clear no eyelid edema noted. pupils equal round and reactive to light and accommodation. Ears, Nose, Mouth, and Throat no gross abnormality of ear auricles or external auditory canals. normal hearing noted during conversation. mucus membranes moist. Respiratory normal breathing without difficulty. clear to auscultation bilaterally. Cardiovascular regular rate and rhythm with normal S1, S2. Gastrointestinal (GI) soft, non-tender, non-distended, +BS. no ventral hernia noted. Musculoskeletal Patient unable to walk without assistance. no significant deformity or arthritic changes, no loss or range of motion, no clubbing. Psychiatric this patient is able to make decisions and demonstrates good insight into disease process. Alert and Oriented x 3. pleasant and cooperative. Notes Again I was not able to really qualify the appearance of the patient's wound unfortunately the video quality was just not that great. Nonetheless based on what I'm hearing it sounds as if the  issue may be deep tissue injury and damage due to the fact that the patient is spending more time in bed really will not lay on one side or the other but stays on his back. Obviously this will lead to complications and issues which I think in this case it has done. I discussed with them that I feel like he needs to spend more time on his sides and not on his back as well is not sitting although it sounds like he's in the bed more than the city. The patient heard me and voice understanding. Electronic Signature(s) Signed: 11/15/2018 12:14:37 AM By: Lenda KelpStone III, Hoyt [name] Entered By: Lenda KelpStone III, Hoyt on 11/14/2018 23:56:28 Cutler, Karna ChristmasEVERETT E. ([id number]) -------------------------------------------------------------------------------- Physician Orders Details Patient Name: [name], [name] Date of Service: 11/13/2018 3:00 PM Medical Record Number: [id number] Patient Account Number: [id number] Date of Birth/Sex: [birth year] (85 y.o. M) Treating RN: Arnette NorrisBiell, Kristina Primary Care Provider: Eleonore ChiquitoKWIATKOWSKI, PETER Other Clinician: Referring Provider: Eleonore ChiquitoKWIATKOWSKI, PETER Treating Provider/Extender: Linwood DibblesSTONE III, HOYT Weeks in Treatment: 0 Verbal / Phone Orders: No Diagnosis Coding ICD-10 Coding Code Description L89.154 Pressure ulcer of sacral region, stage 4 M62.81 Muscle weakness (generalized) Wound Cleansing Wound #3 Midline Sacrum o Clean wound with Normal Saline. - Or house stock wound cleanser Primary Wound Dressing Wound #3 Midline Sacrum o Other: - Vashe soaked gauze packed into the wound Secondary Dressing Wound #3 Midline Sacrum o ABD pad - Secure  with tape Dressing Change Frequency Wound #3 Midline Sacrum o Change dressing every day. Follow-up Appointments Wound #3 Midline Sacrum o Return Appointment in 2 weeks. Off-Loading o Roho cushion for wheelchair o Mattress - Alternating air mattress o Turn and reposition every 2 hours Additional Orders / Instructions o Vitamin A;  Vitamin C, Zinc o Increase protein intake. Electronic Signature(s) Signed: 11/15/2018 12:14:37 AM By: Worthy Keeler [name] Entered By: Worthy Keeler on 11/15/2018 00:03:41 Vanderwoude, Jonna Coup ([id number]) -------------------------------------------------------------------------------- Problem List Details Patient Name: [name], [name] Date of Service: 11/13/2018 3:00 PM Medical Record Number: [id number] Patient Account Number: [id number] Date of Birth/Sex: 05-Mar-1927 (85 y.o. M) Treating RN: Harold Barban Primary Care Provider: Bluford Kaufmann Other Clinician: Referring Provider: Bluford Kaufmann Treating Provider/Extender: Melburn Hake, HOYT Weeks in Treatment: 0 Active Problems ICD-10 Evaluated Encounter Code Description Active Date Today Diagnosis L89.154 Pressure ulcer of sacral region, stage 4 11/14/2018 No Yes M62.81 Muscle weakness (generalized) 11/14/2018 No Yes Inactive Problems Resolved Problems Electronic Signature(s) Signed: 11/15/2018 12:14:37 AM By: Worthy Keeler [name] Entered By: Worthy Keeler on 11/14/2018 23:55:00 [name], [name] ([id number]) -------------------------------------------------------------------------------- Progress Note Details Patient Name: [name], [name] Date of Service: 11/13/2018 3:00 PM Medical Record Number: [id number] Patient Account Number: [id number] Date of Birth/Sex: Nov 30, 1926 (85 y.o. M) Treating RN: Harold Barban Primary Care Provider: Bluford Kaufmann Other Clinician: Referring Provider: Bluford Kaufmann Treating Provider/Extender: Melburn Hake, HOYT Weeks in Treatment: 0 Subjective Chief Complaint Information obtained from Patient Sacral pressure ulcer History of Present Illness (HPI) 04/20/17; this is an 85 year old man who is a bit of an unfortunate story. He was initially admitted to hospital from 9/10 through 9/14 for an elective left total knee replacement secondary to osteoarthritis. On 9/17 a nurse in the  hospital noted non- blanchable erythema. The patient was discharged to [facility] skilled facility. An admission note by Dr. Sheppard Coil on 01/31/17 noted him unstageable sacral decubitus ulcer. The patient was then admitted to hospital from 9/24 through 10/8 now with a sacral wound felt to be infected. The patient had fever and chills. Required in his surgical IandD. Blood cultures grew Clostridium Ramosium and Bacteroides fragilis. I am not able to see the Bacteroides in care everywhere today. Nevertheless the patient was seen by infectious disease and recommended for 6 weeks of ceftriaxone and Flagyl which should be completed. An x-ray in the hospital on 9/24 showed peroneal air which may be secondary to a decubitus ulcer no evidence for acute osseous or bone destructive change. An x-ray in the facility on 11/29 showed findings consistent with acute osteomyelitis of the distal coccyx.. Given the history here I suspect that this is probably not all acute and could have resulted at the time of infection the patient has a history of chronic atrial fibrillation on Xarelto, congestive heart failure with an EF of 40%, hypertension, BPH and osteoarthritis. He has been referred here for consideration of biopsy and culture of exposed coccyx It is difficult to follow the course of this patient's wound. Certainly he could have had underlying osteo in the hospital that wasn't well identified by plane xray and could have had more resistant organisms that were identified. I think we should wait for the results of the bone bx and cx today before initiating any antibiotics. Readmission: 05/23/18 patient presents today for reevaluation here in our clinic although it has been quite some time since he was last here. He is actually a patient of mine which I was seeing at [facility]  nursing facility. Subsequently he has a significant stage for pressure ulcer to the gluteal region which coupled with his muscle weakness  is causing a significant issue at this point as far as healing is concerned. It appears that this patient likely has an infection as well in the wound bed. He actually arrived today with no dressing in place and there was actually evidence of fecal material in the wound bed. His daughter was present for the evaluation today as well as a CNA from the facility. The patient has had this wound since I first began seeing him on October 2018. I last saw him for evaluation on January 05, 2018. This was that [place] when I was still going to the nursing facilities. He said no on-site wound care from a provider other than that the facility since that time. He has not been on any recent antibiotics for the wound. The wound was significantly smaller when I last saw him measuring 3.5 x 2.5 x 3. Today this is much larger and deeper. There's also a significant amount of necrotic material which was not present when I last saw him. 05/30/18 on evaluation today patient appears to be doing excellent in regard to his sacral wound especially as compared to my last evaluation with him. He has been tolerating the dressing changes without complication and though there is some Slough noted there is really no significant bone noted exposed in the wound bed other than a small piece that is working itself out within some granulation tissue. Overall I feel like he's made excellent progress this wound appears to be night and day compared to what it looks like last week. 06/13/18 on evaluation today patient's sacral wound though large appears to be doing excellent. There's no sign of pressure injury and in general the tissue quality the sacral region is excellent with great granulation there's no evidence of infection [name], [name] ([id number]) whatsoever. There is no odor. He also states that his pain is down from a 9-10 out of 10 two 2/10 that most but he did not even really react to me cleaning the wound today. 06/27/18 on  evaluation today patient actually appears to be doing rather well in regard to his sacral wound. The overall appearance is improved and I'm very pleased in this regard. With that being said he continues to have discomfort and his daughter whom I did speak with today over the phone as well still has not had a chance to talk to the director of nursing regarding the Wound VAC because he and many of the staff have been out on sick leave due to the flu. 07/11/18 on evaluation today patient actually appears to be doing excellent in regard to his sacral words. He's been tolerating the dressing changes without complication overall I'm extremely pleased with how things appear today. He tells me that was her there would be a nurse practitioner coming to the facility that could see him on site. Again I explained that that is definitely something that is in the works although I'm not sure exactly which days she will be coming to this facility. Obviously depending on when and if we get this up and running that can definitely be a possibility for him to have first see him there and not have to come out to our office for evaluation and treatment. He would definitely like to consider this has with his daughter she states this was much easier on him. 11/13/18 on evaluation today patient was seen  for a telehealth visit due to the Covid-19 Virus national emergency. Subsequently he is and is still nursing facility and his nurse at the facility who performs the wound care on a daily basis, Alcario Drought, is present during the evaluation. She did measure the wound dimensions for [place] as well today which was excellent and very helpful. Subsequently it appears that in the past week he has noted or rather his wound nurse has noted that the appearance of the wound bed was much darker and did not appear to be nearly as healthy as what it was in the past. Therefore she actually wanted to set up this visit in order for me to try to take a  look. Unfortunately the video quality is not excellent and I really was not able to tell to what extent the wound may be different although it's also been several months since I last saw his wound he was being cared for by I Rosita Kea in the facility he was our nurse practitioner for skilled nursing facilities until just recently. Currently they have been using the Vashe which was previously instituted by myself when I last saw the patient in the clinic. Patient History Information obtained from Patient. Allergies NSAIDS (Non-Steroidal Anti-Inflammatory Drug), Vicodin, morphine Family History Heart Disease - Mother,Father, Hypertension - Mother,Father, No family history of Cancer, Diabetes, Hereditary Spherocytosis, Kidney Disease, Lung Disease, Seizures, Stroke, Thyroid Problems, Tuberculosis. Social History Former smoker - quit in 1996, Marital Status - Widowed, Alcohol Use - Never, Drug Use - No History, Caffeine Use - Daily. Medical History Eyes Denies history of Cataracts, Glaucoma, Optic Neuritis Hematologic/Lymphatic Denies history of Anemia, Hemophilia, Human Immunodeficiency Virus, Lymphedema, Sickle Cell Disease Respiratory Patient has history of Chronic Obstructive Pulmonary Disease (COPD) Cardiovascular Patient has history of Congestive Heart Failure, Hypertension Genitourinary Denies history of End Stage Renal Disease Musculoskeletal Patient has history of Osteoarthritis Denies history of Osteomyelitis Oncologic Denies history of Received Chemotherapy, Received Radiation [name], [name] ([id number]) Medical And Surgical History Notes Eyes macular degeneration Respiratory continuous O2 Review of Systems (ROS) Constitutional Symptoms (General Health) Denies complaints or symptoms of Fatigue, Fever, Chills, Marked Weight Change. Respiratory Denies complaints or symptoms of Chronic or frequent coughs, Shortness of Breath. Cardiovascular Denies complaints or  symptoms of Chest pain, LE edema. Psychiatric Denies complaints or symptoms of Anxiety, Claustrophobia. Objective Constitutional supine blood pressure is within target range for patient.. pulse regular and within target range for patient.[name] respirations regular, non-labored and within target range for patient.[name] temperature within target range for patient.. Well-nourished and well-hydrated in no acute distress. Vitals Time Taken: 3:52 PM, Temperature: 97.7 F, Pulse: 64 bpm, Respiratory Rate: 20 breaths/min, Blood Pressure: 110/62 mmHg, Pulse Oximetry: 94 %. Eyes conjunctiva clear no eyelid edema noted. pupils equal round and reactive to light and accommodation. Ears, Nose, Mouth, and Throat no gross abnormality of ear auricles or external auditory canals. normal hearing noted during conversation. mucus membranes moist. Respiratory normal breathing without difficulty. clear to auscultation bilaterally. Cardiovascular regular rate and rhythm with normal S1, S2. Gastrointestinal (GI) soft, non-tender, non-distended, +BS. no ventral hernia noted. Musculoskeletal Patient unable to walk without assistance. no significant deformity or arthritic changes, no loss or range of motion, no clubbing. Psychiatric this patient is able to make decisions and demonstrates good insight into disease process. Alert and Oriented x 3. pleasant and cooperative. [name], [name] ([id number]) General Notes: Again I was not able to really qualify the appearance of the patient's wound unfortunately the video quality was  just not that great. Nonetheless based on what I'm hearing it sounds as if the issue may be deep tissue injury and damage due to the fact that the patient is spending more time in bed really will not lay on one side or the other but stays on his back. Obviously this will lead to complications and issues which I think in this case it has done. I discussed with them that I feel like he needs to spend  more time on his sides and not on his back as well is not sitting although it sounds like he's in the bed more than the city. The patient heard me and voice understanding. Integumentary (Hair, Skin) Wound #3 status is Open. Original cause of wound was Pressure Injury. The wound is located on the Midline Sacrum. The wound measures 2.8cm length x 3cm width x 2.5cm depth; 6.597cm^2 area and 16.493cm^3 volume. There is muscle and Fat Layer (Subcutaneous Tissue) Exposed exposed. There is tunneling at 9:00 with a maximum distance of 2.5cm. There is additional tunneling and at 11:00 with a maximum distance of 2.5cm. There is a small amount of purulent drainage noted. The wound margin is thickened. There is small (1-33%) red granulation within the wound bed. There is a large (67-100%) amount of necrotic tissue within the wound bed including Adherent Slough. General Notes: Leslie Andrea, LPN at [facility] Assessment Active Problems ICD-10 Pressure ulcer of sacral region, stage 4 Muscle weakness (generalized) Plan Wound Cleansing: Wound #3 Midline Sacrum: Clean wound with Normal Saline. - Or house stock wound cleanser Primary Wound Dressing: Wound #3 Midline Sacrum: Other: - Vashe soaked gauze packed into the wound Secondary Dressing: Wound #3 Midline Sacrum: ABD pad - Secure with tape Dressing Change Frequency: Wound #3 Midline Sacrum: Change dressing every day. Follow-up Appointments: Wound #3 Midline Sacrum: Return Appointment in 2 weeks. Off-Loading: Roho cushion for wheelchair Mattress - Alternating air mattress Turn and reposition every 2 hours Additional Orders / Instructions: Vitamin A; Vitamin C, Zinc Increase protein intake. [name], [name] ([id number]) At this point my suggestion is gonna be that we go ahead and initiate the above wound to measures for the next week and he is in agreement the plan. Thinking changes or worsens meantime he will contact the office through either  his daughter or the wound care nurse at the facility, [facility], in order to see what needs to be done. Otherwise we will see about setting up for a follow-up visit and that will also be a telehealth visit in two weeks time. There are in agreement that plan. Please see above for specific wound care orders. We will see patient for re-evaluation in 2 week(s) here in the clinic. If anything worsens or changes patient will contact our office for additional recommendations. Electronic Signature(s) Signed: 11/15/2018 12:14:37 AM By: Lenda Kelp [name] Entered By: Lenda Kelp on 11/15/2018 00:03:53 Winburn, Karna Christmas ([id number]) -------------------------------------------------------------------------------- ROS/PFSH Details Patient Name: Aerts, [name] Date of Service: 11/13/2018 3:00 PM Medical Record Number: [id number] Patient Account Number: [id number] Date of Birth/Sex: 1926-10-09 (85 y.o. M) Treating RN: Huel Coventry Primary Care Provider: Eleonore Chiquito Other Clinician: Referring Provider: Eleonore Chiquito Treating Provider/Extender: Linwood Dibbles, HOYT Weeks in Treatment: 0 Information Obtained From Patient Constitutional Symptoms (General Health) Complaints and Symptoms: Negative for: Fatigue; Fever; Chills; Marked Weight Change Respiratory Complaints and Symptoms: Negative for: Chronic or frequent coughs; Shortness of Breath Medical History: Positive for: Chronic Obstructive Pulmonary Disease (COPD) Past Medical History Notes: continuous O2 Cardiovascular Complaints  and Symptoms: Negative for: Chest pain; LE edema Medical History: Positive for: Congestive Heart Failure; Hypertension Psychiatric Complaints and Symptoms: Negative for: Anxiety; Claustrophobia Eyes Medical History: Negative for: Cataracts; Glaucoma; Optic Neuritis Past Medical History Notes: macular degeneration Hematologic/Lymphatic Medical History: Negative for: Anemia; Hemophilia; Human Immunodeficiency  Virus; Lymphedema; Sickle Cell Disease Genitourinary Medical History: Negative for: End Stage Renal Disease Musculoskeletal [name], Song E. ([id number]) Medical History: Positive for: Osteoarthritis Negative for: Osteomyelitis Oncologic Medical History: Negative for: Received Chemotherapy; Received Radiation Immunizations Pneumococcal Vaccine: Received Pneumococcal Vaccination: Yes Immunization Notes: up to date Implantable Devices None Family and Social History Cancer: No; Diabetes: No; Heart Disease: Yes - Mother,Father; Hereditary Spherocytosis: No; Hypertension: Yes - Mother,Father; Kidney Disease: No; Lung Disease: No; Seizures: No; Stroke: No; Thyroid Problems: No; Tuberculosis: No; Former smoker - quit in 1996; Marital Status - Widowed; Alcohol Use: Never; Drug Use: No History; Caffeine Use: Daily; Financial Concerns: No; Food, Clothing or Shelter Needs: No; Support System Lacking: No; Transportation Concerns: No Physician Affirmation I have reviewed and agree with the above information. Electronic Signature(s) Signed: 11/15/2018 12:14:37 AM By: Lenda Kelp [name] Signed: 11/28/2018 3:53:21 PM By: Elliot Gurney BSN, RN, CWS, Kim RN, BSN Previous Signature: 11/13/2018 4:46:34 PM Version By: Elliot Gurney BSN, RN, CWS, Kim RN, BSN Entered By: Lenda Kelp on 11/14/2018 23:50:50 Schiefelbein, Karna Christmas ([id number]) -------------------------------------------------------------------------------- SuperBill Details Patient Name: Zeiders, Mackinley E. Date of Service: 11/13/2018 Medical Record Number: [id number] Patient Account Number: [id number] Date of Birth/Sex: May 05, 1927 (85 y.o. M) Treating RN: Arnette Norris Primary Care Provider: Eleonore Chiquito Other Clinician: Referring Provider: Eleonore Chiquito Treating Provider/Extender: Linwood Dibbles, HOYT Weeks in Treatment: 0 Diagnosis Coding ICD-10 Codes Code Description L89.154 Pressure ulcer of sacral region, stage 4 M62.81 Muscle weakness  (generalized) Physician Procedures CPT4 Code: [id number] Description: 99214 - WC PHYS LEVEL 4 - EST PT ICD-10 Diagnosis Description L89.154 Pressure ulcer of sacral region, stage 4 M62.81 Muscle weakness (generalized) Modifier: Quantity: 1 Electronic Signature(s) Signed: 11/15/2018 12:14:37 AM By: Lenda Kelp [name] Entered By: Lenda Kelp on 11/14/2018 23:59:01

## 2018-12-04 ENCOUNTER — Other Ambulatory Visit: Payer: Self-pay

## 2018-12-04 ENCOUNTER — Encounter: Payer: Medicare Other | Attending: Physician Assistant | Admitting: Physician Assistant

## 2018-12-06 NOTE — Progress Notes (Signed)
Inglis, [name] ChristmasVERETT E. ([id number]) Visit Report for 12/04/2018 Chief Complaint Document Details Patient Name: [name], [name] Date of Service: 12/04/2018 4:00 PM Medical Record Number: [id number] Patient Account Number: [id number] Date of Birth/Sex: 1926/12/20 (85 y.o. M) Treating RN: Huel CoventryWoody, Kim Primary Care Provider: Eleonore ChiquitoKWIATKOWSKI, PETER Other Clinician: Referring Provider: Eleonore ChiquitoKWIATKOWSKI, PETER Treating Provider/Extender: Linwood DibblesSTONE III, Mirha Brucato Weeks in Treatment: 3 Information Obtained from: Patient Chief Complaint Sacral pressure ulcer Electronic Signature(s) Signed: 12/06/2018 7:41:37 AM By: Lenda KelpStone III, Zenas Santa [name] Entered By: Lenda KelpStone III, Beza Steppe on 12/04/2018 16:22:47 Duchesne, Klyde E. ([id number]) -------------------------------------------------------------------------------- HPI Details Patient Name: [name], [name] Date of Service: 12/04/2018 4:00 PM Medical Record Number: [id number] Patient Account Number: [id number] Date of Birth/Sex: 1926/12/20 (85 y.o. M) Treating RN: Huel CoventryWoody, Kim Primary Care Provider: Eleonore ChiquitoKWIATKOWSKI, PETER Other Clinician: Referring Provider: Eleonore ChiquitoKWIATKOWSKI, PETER Treating Provider/Extender: Linwood DibblesSTONE III, Aristide Waggle Weeks in Treatment: 3 History of Present Illness HPI Description: 04/20/17; this is an 85 year old man who is a bit of an unfortunate story. He was initially admitted to hospital from 9/10 through 9/14 for an elective left total knee replacement secondary to osteoarthritis. On 9/17 a nurse in the hospital noted non-blanchable erythema. The patient was discharged to [facility] skilled facility. An admission note by Dr. Lyn HollingsheadAlexander on 01/31/17 noted him unstageable sacral decubitus ulcer. The patient was then admitted to hospital from 9/24 through 10/8 now with a sacral wound felt to be infected. The patient had fever and chills. Required in his surgical IandD. Blood cultures grew Clostridium Ramosium and Bacteroides fragilis. I am not able to see the Bacteroides in care everywhere  today. Nevertheless the patient was seen by infectious disease and recommended for 6 weeks of ceftriaxone and Flagyl which should be completed. An x-ray in the hospital on 9/24 showed peroneal air which may be secondary to a decubitus ulcer no evidence for acute osseous or bone destructive change. An x-ray in the facility on 11/29 showed findings consistent with acute osteomyelitis of the distal coccyx.. Given the history here I suspect that this is probably not all acute and could have resulted at the time of infection the patient has a history of chronic atrial fibrillation on Xarelto, congestive heart failure with an EF of 40%, hypertension, BPH and osteoarthritis. He has been referred here for consideration of biopsy and culture of exposed coccyx It is difficult to follow the course of this patient's wound. Certainly he could have had underlying osteo in the hospital that wasn't well identified by plane xray and could have had more resistant organisms that were identified. I think we should wait for the results of the bone bx and cx today before initiating any antibiotics. Readmission: 05/23/18 patient presents today for reevaluation here in our clinic although it has been quite some time since he was last here. He is actually a patient of mine which I was seeing [facility]. Subsequently he has a significant stage for pressure ulcer to the gluteal region which coupled with his muscle weakness is causing a significant issue at this point as far as healing is concerned. It appears that this patient likely has an infection as well in the wound bed. He actually arrived today with no dressing in place and there was actually evidence of fecal material in the wound bed. His daughter was present for the evaluation today as well as a CNA from the facility. The patient has had this wound since I first began seeing him on October 2018. I last saw him for evaluation on January 05, 2018.  This was that [place] when I was still going to the nursing facilities. He said no on-site wound care from a provider other than that the facility since that time. He has not been on any recent antibiotics for the wound. The wound was significantly smaller when I last saw him measuring 3.5 x 2.5 x 3. Today this is much larger and deeper. There's also a significant amount of necrotic material which was not present when I last saw him. 05/30/18 on evaluation today patient appears to be doing excellent in regard to his sacral wound especially as compared to my last evaluation with him. He has been tolerating the dressing changes without complication and though there is some Slough noted there is really no significant bone noted exposed in the wound bed other than a small piece that is working itself out within some granulation tissue. Overall I feel like he's made excellent progress this wound appears to be night and day compared to what it looks like last week. 06/13/18 on evaluation today patient's sacral wound though large appears to be doing excellent. There's no sign of pressure injury and in general the tissue quality the sacral region is excellent with great granulation there's no evidence of infection whatsoever. There is no odor. He also states that his pain is down from a 9-10 out of 10 two 2/10 that most but he did not even really react to me cleaning the wound today. 06/27/18 on evaluation today patient actually appears to be doing rather well in regard to his sacral wound. The overall appearance is improved and I'm very pleased in this regard. With that being said he continues to have discomfort and his daughter whom I did speak with today over the phone as well still has not had a chance to talk to the director of nursing [name], [name]. ([phone]) regarding the Wound VAC because he and many of the staff have been out on sick leave due to the flu. 07/11/18 on evaluation today patient  actually appears to be doing excellent in regard to his sacral words. He's been tolerating the dressing changes without complication overall I'm extremely pleased with how things appear today. He tells me that was her there would be a nurse practitioner coming to the facility that could see him on site. Again I explained that that is definitely something that is in the works although I'm not sure exactly which days she will be coming to this facility. Obviously depending on when and if we get this up and running that can definitely be a possibility for him to have first see him there and not have to come out to our office for evaluation and treatment. He would definitely like to consider this has with his daughter she states this was much easier on him. 11/13/18 on evaluation today patient was seen for a telehealth visit due to the Covid-19 Virus national emergency. Subsequently he is and is still nursing facility and his nurse at the facility who performs the wound care on a daily basis, Danae Chen, is present during the evaluation. She did measure the wound dimensions for [place] as well today which was excellent and very helpful. Subsequently it appears that in the past week he has noted or rather his wound nurse has noted that the appearance of the wound bed was much darker and did not appear to be nearly as healthy as what it was in the past. Therefore she actually wanted to set up this visit  in order for me to try to take a look. Unfortunately the video quality is not excellent and I really was not able to tell to what extent the wound may be different although it's also been several months since I last saw his wound he was being cared for by I Rosita KeaKeisha Knight in the facility he was our nurse practitioner for skilled nursing facilities until just recently. Currently they have been using the Vashe which was previously instituted by myself when I last saw the patient in the clinic. 12/04/18 on evaluation  today patient is seen by way of telehealth visit at the nursing facility due to the fact that he has been quarantined by the facility simply in response to attempting to prevent any spread of Covid-19 Virus. If he comes out to the clinic he actually has this been [facility] and they really don't have the staff to do this. For that reason the trafficking patient's from going out if at all possible. Nonetheless the good news is he seems to be doing better according to what his nurse is telling me today. Measurement wise everything seemed to be significantly improved which was great news. Fortunately there's no evidence of active infection at this time. Patient states she's not having any significant pain which is excellent news he seems to be in good spirits. Electronic Signature(s) Signed: 12/06/2018 7:41:37 AM By: Lenda KelpStone III, Zayleigh Stroh [name] Entered By: Lenda KelpStone III, Bradie Lacock on 12/05/2018 09:28:11 Mclarty, [name] ChristmasEVERETT E. ([id number]) -------------------------------------------------------------------------------- Physical Exam Details Patient Name: Schuhmacher, Arben E. Date of Service: 12/04/2018 4:00 PM Medical Record Number: [id number] Patient Account Number: [id number] Date of Birth/Sex: [birth year] (85 y.o. M) Treating RN: Huel CoventryWoody, Kim Primary Care Provider: Eleonore ChiquitoKWIATKOWSKI, PETER Other Clinician: Referring Provider: Eleonore ChiquitoKWIATKOWSKI, PETER Treating Provider/Extender: Linwood DibblesSTONE III, Dede Dobesh Weeks in Treatment: 3 Constitutional Obese and well-hydrated in no acute distress. Ears, Nose, Mouth, and Throat normal hearing noted during conversation. mucus membranes moist. Respiratory normal breathing without difficulty. Psychiatric this patient is able to make decisions and demonstrates good insight into disease process. Alert and Oriented x 3. pleasant and cooperative. Notes Upon inspection today as best I could tell although the video feed was not excellent the wound bed does appear to be more granular and  there's no discoloration in the base of the wound bed. There is no purulent drainage and no signs of infection at this time which is good news. Overall the measurements were smaller as well compared to measurements that I obtained two weeks ago. Electronic Signature(s) Signed: 12/06/2018 7:41:37 AM By: Lenda KelpStone III, Maricela Kawahara [name] Entered By: Lenda KelpStone III, Mende Biswell on 12/05/2018 09:29:12 Timmins, [name] ChristmasEVERETT E. ([id number]) -------------------------------------------------------------------------------- Physician Orders Details Patient Name: Kaney, Lanson E. Date of Service: 12/04/2018 4:00 PM Medical Record Number: [id number] Patient Account Number: [id number] Date of Birth/Sex: [birth year] (85 y.o. M) Treating RN: Huel CoventryWoody, Kim Primary Care Provider: Eleonore ChiquitoKWIATKOWSKI, PETER Other Clinician: Referring Provider: Eleonore ChiquitoKWIATKOWSKI, PETER Treating Provider/Extender: Linwood DibblesSTONE III, Dela Sweeny Weeks in Treatment: 3 Verbal / Phone Orders: No Diagnosis Coding ICD-10 Coding Code Description L89.154 Pressure ulcer of sacral region, stage 4 M62.81 Muscle weakness (generalized) Wound Cleansing Wound #3 Midline Sacrum o Clean wound with Normal Saline. - Or house stock wound cleanser Primary Wound Dressing Wound #3 Midline Sacrum o Other: - Vashe soaked gauze packed into the wound Secondary Dressing Wound #3 Midline Sacrum o ABD pad - Secure with tape Dressing Change Frequency Wound #3 Midline Sacrum o Change dressing every day. Follow-up Appointments Wound #3 Midline Sacrum o Return Appointment in  2 weeks. Off-Loading o Roho cushion for wheelchair o Mattress - Alternating air mattress o Turn and reposition every 2 hours Additional Orders / Instructions o Vitamin A; Vitamin C, Zinc o Increase protein intake. Electronic Signature(s) Signed: 12/06/2018 7:41:37 AM By: Lenda Kelp [name] Entered By: Lenda Kelp on 12/05/2018 09:29:41 Busler, [name] Christmas  ([id number]) -------------------------------------------------------------------------------- Problem List Details Patient Name: Woolridge, Ryken E. Date of Service: 12/04/2018 4:00 PM Medical Record Number: [id number] Patient Account Number: [id number] Date of Birth/Sex: [birth year] (85 y.o. M) Treating RN: Huel Coventry Primary Care Provider: Eleonore Chiquito Other Clinician: Referring Provider: Eleonore Chiquito Treating Provider/Extender: Linwood Dibbles, Lincoln Kleiner Weeks in Treatment: 3 Active Problems ICD-10 Evaluated Encounter Code Description Active Date Today Diagnosis L89.154 Pressure ulcer of sacral region, stage 4 11/14/2018 No Yes M62.81 Muscle weakness (generalized) 11/14/2018 No Yes Inactive Problems Resolved Problems Electronic Signature(s) Signed: 12/06/2018 7:41:37 AM By: Lenda Kelp [name] Entered By: Lenda Kelp on 12/04/2018 16:22:40 Brandhorst, Louay E. ([id number]) -------------------------------------------------------------------------------- Progress Note Details Patient Name: Ure, Haydin E. Date of Service: 12/04/2018 4:00 PM Medical Record Number: [id number] Patient Account Number: [id number] Date of Birth/Sex: Sep 19, 1926 (85 y.o. M) Treating RN: Huel Coventry Primary Care Provider: Eleonore Chiquito Other Clinician: Referring Provider: Eleonore Chiquito Treating Provider/Extender: Linwood Dibbles, Jimeka Balan Weeks in Treatment: 3 Subjective Chief Complaint Information obtained from Patient Sacral pressure ulcer History of Present Illness (HPI) 04/20/17; this is an 85 year old man who is a bit of an unfortunate story. He was initially admitted to hospital from 9/10 through 9/14 for an elective left total knee replacement secondary to osteoarthritis. On 9/17 a nurse in the hospital noted non- blanchable erythema. The patient was discharged to [facility] skilled facility. An admission note by Dr. Lyn Hollingshead on 01/31/17 noted him unstageable sacral decubitus ulcer. The patient was  then admitted to hospital from 9/24 through 10/8 now with a sacral wound felt to be infected. The patient had fever and chills. Required in his surgical IandD. Blood cultures grew Clostridium Ramosium and Bacteroides fragilis. I am not able to see the Bacteroides in care everywhere today. Nevertheless the patient was seen by infectious disease and recommended for 6 weeks of ceftriaxone and Flagyl which should be completed. An x-ray in the hospital on 9/24 showed peroneal air which may be secondary to a decubitus ulcer no evidence for acute osseous or bone destructive change. An x-ray in the facility on 11/29 showed findings consistent with acute osteomyelitis of the distal coccyx.. Given the history here I suspect that this is probably not all acute and could have resulted at the time of infection the patient has a history of chronic atrial fibrillation on Xarelto, congestive heart failure with an EF of 40%, hypertension, BPH and osteoarthritis. He has been referred here for consideration of biopsy and culture of exposed coccyx It is difficult to follow the course of this patient's wound. Certainly he could have had underlying osteo in the hospital that wasn't well identified by plane xray and could have had more resistant organisms that were identified. I think we should wait for the results of the bone bx and cx today before initiating any antibiotics. Readmission: 05/23/18 patient presents today for reevaluation here in our clinic although it has been quite some time since he was last here. He is actually a patient of mine which I was seeing at [facility] nursing facility. Subsequently he has a significant stage for pressure ulcer to the gluteal region which coupled with his muscle weakness is causing a  significant issue at this point as far as healing is concerned. It appears that this patient likely has an infection as well in the wound bed. He actually arrived today with no dressing in place  and there was actually evidence of fecal material in the wound bed. His daughter was present for the evaluation today as well as a CNA from the facility. The patient has had this wound since I first began seeing him on October 2018. I last saw him for evaluation on January 05, 2018. This was that [place] when I was still going to the nursing facilities. He said no on-site wound care from a provider other than that the facility since that time. He has not been on any recent antibiotics for the wound. The wound was significantly smaller when I last saw him measuring 3.5 x 2.5 x 3. Today this is much larger and deeper. There's also a significant amount of necrotic material which was not present when I last saw him. 05/30/18 on evaluation today patient appears to be doing excellent in regard to his sacral wound especially as compared to my last evaluation with him. He has been tolerating the dressing changes without complication and though there is some Slough noted there is really no significant bone noted exposed in the wound bed other than a small piece that is working itself out within some granulation tissue. Overall I feel like he's made excellent progress this wound appears to be night and day compared to what it looks like last week. 06/13/18 on evaluation today patient's sacral wound though large appears to be doing excellent. There's no sign of pressure injury and in general the tissue quality the sacral region is excellent with great granulation there's no evidence of infection Terrero, Damarie E. ([id number]) whatsoever. There is no odor. He also states that his pain is down from a 9-10 out of 10 two 2/10 that most but he did not even really react to me cleaning the wound today. 06/27/18 on evaluation today patient actually appears to be doing rather well in regard to his sacral wound. The overall appearance is improved and I'm very pleased in this regard. With that being said he continues to  have discomfort and his daughter whom I did speak with today over the phone as well still has not had a chance to talk to the director of nursing regarding the Wound VAC because he and many of the staff have been out on sick leave due to the flu. 07/11/18 on evaluation today patient actually appears to be doing excellent in regard to his sacral words. He's been tolerating the dressing changes without complication overall I'm extremely pleased with how things appear today. He tells me that was her there would be a nurse practitioner coming to the facility that could see him on site. Again I explained that that is definitely something that is in the works although I'm not sure exactly which days she will be coming to this facility. Obviously depending on when and if we get this up and running that can definitely be a possibility for him to have first see him there and not have to come out to our office for evaluation and treatment. He would definitely like to consider this has with his daughter she states this was much easier on him. 11/13/18 on evaluation today patient was seen for a telehealth visit due to the Covid-19 Virus national emergency. Subsequently he is and is still nursing facility and his nurse at the  facility who performs the wound care on a daily basis, Alcario Drought, is present during the evaluation. She did measure the wound dimensions for [place] as well today which was excellent and very helpful. Subsequently it appears that in the past week he has noted or rather his wound nurse has noted that the appearance of the wound bed was much darker and did not appear to be nearly as healthy as what it was in the past. Therefore she actually wanted to set up this visit in order for me to try to take a look. Unfortunately the video quality is not excellent and I really was not able to tell to what extent the wound may be different although it's also been several months since I last saw his wound he was  being cared for by I Rosita Kea in the facility he was our nurse practitioner for skilled nursing facilities until just recently. Currently they have been using the Vashe which was previously instituted by myself when I last saw the patient in the clinic. 12/04/18 on evaluation today patient is seen by way of telehealth visit at the nursing facility due to the fact that he has been quarantined by the facility simply in response to attempting to prevent any spread of Covid-19 Virus. If he comes out to the clinic he actually has this been two weeks in the [facility] and they really don't have the staff to do this. For that reason the trafficking patient's from going out if at all possible. Nonetheless the good news is he seems to be doing better according to what his nurse is telling me today. Measurement wise everything seemed to be significantly improved which was great news. Fortunately there's no evidence of active infection at this time. Patient states she's not having any significant pain which is excellent news he seems to be in good spirits. Patient History Information obtained from Patient. Allergies NSAIDS (Non-Steroidal Anti-Inflammatory Drug), Vicodin, morphine Family History Heart Disease - Mother,Father, Hypertension - Mother,Father, No family history of Cancer, Diabetes, Hereditary Spherocytosis, Kidney Disease, Lung Disease, Seizures, Stroke, Thyroid Problems, Tuberculosis. Social History Former smoker - quit in 1996, Marital Status - Widowed, Alcohol Use - Never, Drug Use - No History, Caffeine Use - Daily. Medical History Eyes Denies history of Cataracts, Glaucoma, Optic Neuritis Hematologic/Lymphatic Denies history of Anemia, Hemophilia, Human Immunodeficiency Virus, Lymphedema, Sickle Cell Disease Respiratory Patient has history of Chronic Obstructive Pulmonary Disease (COPD) Cardiovascular Patient has history of Congestive Heart Failure, Hypertension Kibler,  Carver E. ([id number]) Genitourinary Denies history of End Stage Renal Disease Musculoskeletal Patient has history of Osteoarthritis Denies history of Osteomyelitis Oncologic Denies history of Received Chemotherapy, Received Radiation Medical And Surgical History Notes Eyes macular degeneration Respiratory continuous O2 Review of Systems (ROS) Constitutional Symptoms (General Health) Denies complaints or symptoms of Fatigue, Fever, Chills, Marked Weight Change. Respiratory Denies complaints or symptoms of Chronic or frequent coughs, Shortness of Breath. Cardiovascular Denies complaints or symptoms of Chest pain, LE edema. Psychiatric Denies complaints or symptoms of Anxiety, Claustrophobia. Objective Constitutional Obese and well-hydrated in no acute distress. Ears, Nose, Mouth, and Throat normal hearing noted during conversation. mucus membranes moist. Respiratory normal breathing without difficulty. Psychiatric this patient is able to make decisions and demonstrates good insight into disease process. Alert and Oriented x 3. pleasant and cooperative. General Notes: Upon inspection today as best I could tell although the video feed was not excellent the wound bed does appear to be more granular and there's no discoloration in the  base of the wound bed. There is no purulent drainage and no signs of infection at this time which is good news. Overall the measurements were smaller as well compared to measurements that I obtained two weeks ago. Integumentary (Hair, Skin) Wound #3 status is Open. Original cause of wound was Pressure Injury. The wound is located on the Midline Sacrum. The wound measures 2cm length x 2.4cm width x 2.5cm depth; 3.77cm^2 area and 9.425cm^3 volume. There is muscle and Fat Layer (Subcutaneous Tissue) Exposed exposed. There is no tunneling or undermining noted. There is a small amount of purulent drainage noted. The wound margin is thickened. There is  large (67-100%) red granulation within the wound bed. Krouse, Morio E. ([id number]) There is a small (1-33%) amount of necrotic tissue within the wound bed including Adherent Slough. Assessment Active Problems ICD-10 Pressure ulcer of sacral region, stage 4 Muscle weakness (generalized) Plan Wound Cleansing: Wound #3 Midline Sacrum: Clean wound with Normal Saline. - Or house stock wound cleanser Primary Wound Dressing: Wound #3 Midline Sacrum: Other: - Vashe soaked gauze packed into the wound Secondary Dressing: Wound #3 Midline Sacrum: ABD pad - Secure with tape Dressing Change Frequency: Wound #3 Midline Sacrum: Change dressing every day. Follow-up Appointments: Wound #3 Midline Sacrum: Return Appointment in 2 weeks. Off-Loading: Roho cushion for wheelchair Mattress - Alternating air mattress Turn and reposition every 2 hours Additional Orders / Instructions: Vitamin A; Vitamin C, Zinc Increase protein intake. My suggestion at this time is gonna be that we go ahead and continue with the above wound to measures for the next week and patient's nurses in agreement with that plan as is the patient. Overall he seems to be doing well so I'm happy with continuing with the Vashe. If anything changes worsens the meantime they will contact the office let me know. Otherwise I'll plan to do a telehealth visit in two weeks time with him again as we did this time. Please see above for specific wound care orders. We will see patient for re-evaluation in 1 week(s) here in the clinic. If anything worsens or changes patient will contact our office for additional recommendations. Electronic Signature(s) Signed: 12/06/2018 7:41:37 AM By: Lenda KelpStone III, Boris Engelmann [name] Perrott, Hristopher E. ([id number]) Entered By: Lenda KelpStone III, Bynum Mccullars on 12/05/2018 09:30:26 Guion, [name] ChristmasEVERETT E. ([id number]) -------------------------------------------------------------------------------- ROS/PFSH Details Patient Name: Chopp, Gautham  E. Date of Service: 12/04/2018 4:00 PM Medical Record Number: [id number] Patient Account Number: [id number] Date of Birth/Sex: 10-02-1926 (85 y.o. M) Treating RN: Huel CoventryWoody, Kim Primary Care Provider: Eleonore ChiquitoKWIATKOWSKI, PETER Other Clinician: Referring Provider: Eleonore ChiquitoKWIATKOWSKI, PETER Treating Provider/Extender: Linwood DibblesSTONE III, Tineka Uriegas Weeks in Treatment: 3 Information Obtained From Patient Constitutional Symptoms (General Health) Complaints and Symptoms: Negative for: Fatigue; Fever; Chills; Marked Weight Change Respiratory Complaints and Symptoms: Negative for: Chronic or frequent coughs; Shortness of Breath Medical History: Positive for: Chronic Obstructive Pulmonary Disease (COPD) Past Medical History Notes: continuous O2 Cardiovascular Complaints and Symptoms: Negative for: Chest pain; LE edema Medical History: Positive for: Congestive Heart Failure; Hypertension Psychiatric Complaints and Symptoms: Negative for: Anxiety; Claustrophobia Eyes Medical History: Negative for: Cataracts; Glaucoma; Optic Neuritis Past Medical History Notes: macular degeneration Hematologic/Lymphatic Medical History: Negative for: Anemia; Hemophilia; Human Immunodeficiency Virus; Lymphedema; Sickle Cell Disease Genitourinary Medical History: Negative for: End Stage Renal Disease Musculoskeletal Runkle, Jarmarcus E. ([id number]) Medical History: Positive for: Osteoarthritis Negative for: Osteomyelitis Oncologic Medical History: Negative for: Received Chemotherapy; Received Radiation Immunizations Pneumococcal Vaccine: Received Pneumococcal Vaccination: Yes Immunization Notes: up to date Implantable Devices None Family and Social  History Cancer: No; Diabetes: No; Heart Disease: Yes - Mother,Father; Hereditary Spherocytosis: No; Hypertension: Yes - Mother,Father; Kidney Disease: No; Lung Disease: No; Seizures: No; Stroke: No; Thyroid Problems: No; Tuberculosis: No; Former smoker - quit in 1996; Marital Status  - Widowed; Alcohol Use: Never; Drug Use: No History; Caffeine Use: Daily; Financial Concerns: No; Food, Clothing or Shelter Needs: No; Support System Lacking: No; Transportation Concerns: No Physician Affirmation I have reviewed and agree with the above information. Electronic Signature(s) Signed: 12/05/2018 9:33:28 AM By: Gretta Cool, BSN, RN, CWS, Kim RN, BSN Signed: 12/06/2018 7:41:37 AM By: Worthy Keeler [name] Entered By: Worthy Keeler on 12/04/2018 16:22:16 Kruck, Quinnlan EMarland Kitchen ([id number]) -------------------------------------------------------------------------------- SuperBill Details Patient Name: Broce, Arieh E. Date of Service: 12/04/2018 Medical Record Number: [id number] Patient Account Number: [id number] Date of Birth/Sex: 12/16/1926 (85 y.o. M) Treating RN: Cornell Barman Primary Care Provider: Bluford Kaufmann Other Clinician: Referring Provider: Bluford Kaufmann Treating Provider/Extender: Melburn Hake, Chay Mazzoni Weeks in Treatment: 3 Diagnosis Coding ICD-10 Codes Code Description L89.154 Pressure ulcer of sacral region, stage 4 M62.81 Muscle weakness (generalized) Physician Procedures CPT4 Code: 1937902 Description: 40973 - WC PHYS LEVEL 4 - EST PT ICD-10 Diagnosis Description L89.154 Pressure ulcer of sacral region, stage 4 M62.81 Muscle weakness (generalized) Modifier: Quantity: 1 Electronic Signature(s) Signed: 12/06/2018 7:41:37 AM By: Worthy Keeler [name] Entered By: Worthy Keeler on 12/04/2018 22:28:12

## 2018-12-06 NOTE — Progress Notes (Signed)
Deming, [name] ([id number]) Visit Report for 12/04/2018 Allergy List Details Patient Name: [name], [name] Date of Service: 12/04/2018 4:00 PM Medical Record Number: [id number] Patient Account Number: [id number] Date of Birth/Sex: 15-Sep-1926 (85 y.o. M) Treating RN: Cornell Barman Primary Care Mauria Asquith: Bluford Kaufmann Other Clinician: Referring Aashna Matson: Bluford Kaufmann Treating Clorene Nerio/Extender: Melburn Hake, HOYT Weeks in Treatment: 3 Allergies Active Allergies NSAIDS (Non-Steroidal Anti-Inflammatory Drug) Vicodin morphine Allergy Notes Electronic Signature(s) Signed: 12/06/2018 7:41:37 AM By: Worthy Keeler [name] Entered By: Worthy Keeler on 12/04/2018 16:22:24 Tarango, Remi E. ([id number]) -------------------------------------------------------------------------------- Wound Assessment Details Patient Name: [name], [name] Date of Service: 12/04/2018 4:00 PM Medical Record Number: [id number] Patient Account Number: [id number] Date of Birth/Sex: 10-15-1926 (85 y.o. M) Treating RN: Cornell Barman Primary Care Darelle Kings: Bluford Kaufmann Other Clinician: Referring Zyniah Ferraiolo: Bluford Kaufmann Treating Reine Bristow/Extender: Melburn Hake, HOYT Weeks in Treatment: 3 Wound Status Wound Number: 3 Primary Pressure Ulcer Etiology: Wound Location: Sacrum - Midline Wound Open Wounding Event: Pressure Injury Status: Date Acquired: 05/28/2017 Comorbid Chronic Obstructive Pulmonary Disease Weeks Of Treatment: 3 History: (COPD), Congestive Heart Failure, Clustered Wound: No Hypertension, Osteoarthritis Wound Measurements Length: (cm) 2 Width: (cm) 2.4 Depth: (cm) 2.5 Area: (cm) 3.77 Volume: (cm) 9.425 % Reduction in Area: 42.9% % Reduction in Volume: 42.9% Epithelialization: Medium (34-66%) Tunneling: No Undermining: No Wound Description Classification: Category/Stage IV Wound Margin: Thickened Exudate Amount: Small Exudate Type: Purulent Exudate Color: yellow, brown,  green Foul Odor After Cleansing: No Slough/Fibrino Yes Wound Bed Granulation Amount: Large (67-100%) Exposed Structure Granulation Quality: Red Fascia Exposed: No Necrotic Amount: Small (1-33%) Fat Layer (Subcutaneous Tissue) Exposed: Yes Necrotic Quality: Adherent Slough Tendon Exposed: No Muscle Exposed: Yes Necrosis of Muscle: No Joint Exposed: No Bone Exposed: No Electronic Signature(s) Signed: 12/05/2018 9:33:28 AM By: Gretta Cool, BSN, RN, CWS, Kim RN, BSN Signed: 12/06/2018 7:41:37 AM By: Worthy Keeler [name] Entered By: Worthy Keeler on 12/04/2018 16:19:04

## 2018-12-18 ENCOUNTER — Encounter: Payer: Medicare Other | Admitting: Physician Assistant

## 2018-12-21 ENCOUNTER — Encounter: Payer: Medicare Other | Attending: Physician Assistant | Admitting: Physician Assistant

## 2018-12-22 NOTE — Progress Notes (Signed)
Cavenaugh, DEONDRICK SEARLS ([id number]) Visit Report for 12/21/2018 Allergy List Details Patient Name: [name], [name] Date of Service: 12/21/2018 8:00 AM Medical Record Number: [id number] Patient Account Number: [id number] Date of Birth/Sex: 11-23-1926 (85 y.o. M) Treating RN: Primary Care Heike Pounds: Bluford Kaufmann Other Clinician: Referring Sarah Baez: Bluford Kaufmann Treating Clevester Helzer/Extender: STONE III, HOYT Weeks in Treatment: 5 Allergies Active Allergies NSAIDS (Non-Steroidal Anti-Inflammatory Drug) Vicodin morphine Allergy Notes Electronic Signature(s) Signed: 12/22/2018 2:47:02 AM By: Worthy Keeler [name] Entered By: Worthy Keeler on 12/22/2018 02:41:46 [place], Brysin E. ([id number]) -------------------------------------------------------------------------------- Wound Assessment Details Patient Name: [name], [name] Date of Service: 12/21/2018 8:00 AM Medical Record Number: [id number] Patient Account Number: [id number] Date of Birth/Sex: 06-09-1926 (85 y.o. M) Treating RN: Primary Care Verner Mccrone: Bluford Kaufmann Other Clinician: Referring Durenda Pechacek: Bluford Kaufmann Treating Romayne Ticas/Extender: Melburn Hake, HOYT Weeks in Treatment: 5 Wound Status Wound Number: 3 Primary Pressure Ulcer Etiology: Wound Location: Sacrum - Midline Wound Open Wounding Event: Pressure Injury Status: Date Acquired: 05/28/2017 Comorbid Chronic Obstructive Pulmonary Disease Weeks Of Treatment: 5 History: (COPD), Congestive Heart Failure, Clustered Wound: No Hypertension, Osteoarthritis Wound Measurements Length: (cm) 1.8 Width: (cm) 1.2 Depth: (cm) 2 Area: (cm) 1.696 Volume: (cm) 3.393 % Reduction in Area: 74.3% % Reduction in Volume: 79.4% Epithelialization: Medium (34-66%) Tunneling: No Undermining: No Wound Description Classification: Category/Stage IV Wound Margin: Thickened Exudate Amount: Small Exudate Type: Purulent Exudate Color: yellow, brown, green Foul Odor After  Cleansing: No Slough/Fibrino Yes Wound Bed Granulation Amount: Large (67-100%) Exposed Structure Granulation Quality: Red Fascia Exposed: No Necrotic Amount: None Present (0%) Fat Layer (Subcutaneous Tissue) Exposed: Yes Tendon Exposed: No Muscle Exposed: No Joint Exposed: No Bone Exposed: No Electronic Signature(s) Signed: 12/22/2018 2:47:02 AM By: Worthy Keeler [name] Entered By: Worthy Keeler on 12/22/2018 02:46:22

## 2018-12-22 NOTE — Progress Notes (Signed)
Ravert, [name] ([id number]) Visit Report for 12/21/2018 Chief Complaint Document Details Patient Name: Lackman, Patrich E. Date of Service: 12/21/2018 8:00 AM Medical Record Number: [id number] Patient Account Number: [id number] Date of Birth/Sex: 1927-01-07 (85 y.o. M) Treating RN: Primary Care Provider: Eleonore ChiquitoKWIATKOWSKI, PETER Other Clinician: Referring Provider: Eleonore ChiquitoKWIATKOWSKI, PETER Treating Provider/Extender: Linwood DibblesSTONE III, HOYT Weeks in Treatment: 5 Information Obtained from: Patient Chief Complaint Sacral pressure ulcer Electronic Signature(s) Signed: 12/22/2018 2:47:02 AM By: Lenda KelpStone III, Hoyt [name] Entered By: Lenda KelpStone III, Hoyt on 12/22/2018 02:42:01 Cosgriff, Antwann E. ([id number]) -------------------------------------------------------------------------------- HPI Details Patient Name: Huang, Yonas E. Date of Service: 12/21/2018 8:00 AM Medical Record Number: [id number] Patient Account Number: [id number] Date of Birth/Sex: 1927-01-07 (85 y.o. M) Treating RN: Primary Care Provider: Eleonore ChiquitoKWIATKOWSKI, PETER Other Clinician: Referring Provider: Eleonore ChiquitoKWIATKOWSKI, PETER Treating Provider/Extender: Linwood DibblesSTONE III, HOYT Weeks in Treatment: 5 History of Present Illness HPI Description: 04/20/17; this is an 85 year old man who is a bit of an unfortunate story. He was initially admitted to hospital from 9/10 through 9/14 for an elective left total knee replacement secondary to osteoarthritis. On 9/17 a nurse in the hospital noted non-blanchable erythema. The patient was discharged to [facility]. An admission note by Dr. Lyn HollingsheadAlexander on 01/31/17 noted him unstageable sacral decubitus ulcer. The patient was then admitted to hospital from 9/24 through 10/8 now with a sacral wound felt to be infected. The patient had fever and chills. Required in his surgical IandD. Blood cultures grew Clostridium Ramosium and Bacteroides fragilis. I am not able to see the Bacteroides in care everywhere today. Nevertheless the patient  was seen by infectious disease and recommended for 6 weeks of ceftriaxone and Flagyl which should be completed. An x-ray in the hospital on 9/24 showed peroneal air which may be secondary to a decubitus ulcer no evidence for acute osseous or bone destructive change. An x-ray in the facility on 11/29 showed findings consistent with acute osteomyelitis of the distal coccyx.. Given the history here I suspect that this is probably not all acute and could have resulted at the time of infection the patient has a history of chronic atrial fibrillation on Xarelto, congestive heart failure with an EF of 40%, hypertension, BPH and osteoarthritis. He has been referred here for consideration of biopsy and culture of exposed coccyx It is difficult to follow the course of this patient's wound. Certainly he could have had underlying osteo in the hospital that wasn't well identified by plane xray and could have had more resistant organisms that were identified. I think we should wait for the results of the bone bx and cx today before initiating any antibiotics. Readmission: 05/23/18 patient presents today for reevaluation here in our clinic although it has been quite some time since he was last here. He is actually a patient of mine which [facility]. Subsequently he has a significant stage for pressure ulcer to the gluteal region which coupled with his muscle weakness is causing a significant issue at this point as far as healing is concerned. It appears that this patient likely has an infection as well in the wound bed. He actually arrived today with no dressing in place and there was actually evidence of fecal material in the wound bed. His daughter was present for the evaluation today as well as a CNA from the facility. The patient has had this wound since I first began seeing him on October 2018. I last saw him for evaluation on January 05, 2018. This was  that [facility] when I  was [facility]. He said no on-site wound care from a provider other than that the facility since that time. He has not been on any recent antibiotics for the wound. The wound was significantly smaller when I last saw him measuring 3.5 x 2.5 x 3. Today this is much larger and deeper. There's also a significant amount of necrotic material which was not present when I last saw him. 05/30/18 on evaluation today patient appears to be doing excellent in regard to his sacral wound especially as compared to my last evaluation with him. He has been tolerating the dressing changes without complication and though there is some Slough noted there is really no significant bone noted exposed in the wound bed other than a small piece that is working itself out within some granulation tissue. Overall I feel like he's made excellent progress this wound appears to be night and day compared to what it looks like last week. 06/13/18 on evaluation today patient's sacral wound though large appears to be doing excellent. There's no sign of pressure injury and in general the tissue quality the sacral region is excellent with great granulation there's no evidence of infection whatsoever. There is no odor. He also states that his pain is down from a 9-10 out of 10 two 2/10 that most but he did not even really react to me cleaning the wound today. 06/27/18 on evaluation today patient actually appears to be doing rather well in regard to his sacral wound. The overall appearance is improved and I'm very pleased in this regard. With that being said he continues to have discomfort and his daughter whom I did speak with today over the phone as well still has not had a chance to talk to the director of nursing [place], [name] ([id number]) regarding the Wound VAC because he and many of the staff have been out on sick leave due to the flu. 07/11/18 on evaluation today patient actually appears to be doing  excellent in regard to his sacral words. He's been tolerating the dressing changes without complication overall I'm extremely pleased with how things appear today. He tells me that was her there would be a nurse practitioner coming to the facility that could see him on site. Again I explained that that is definitely something that is in the works although I'm not sure exactly which days she will be coming to this facility. Obviously depending on when and if we get this up and running that can definitely be a possibility for him to have first see him there and not have to come out to our office for evaluation and treatment. He would definitely like to consider this has with his daughter she states this was much easier on him. 11/13/18 on evaluation today patient was seen for a telehealth visit due to the Covid-19 Virus national emergency. Subsequently he is and is still nursing facility and his nurse at the facility who performs the wound care on a daily basis, Alcario Droughtrica, is present during the evaluation. She did measure the wound dimensions for us as well today which was excellent and very helpful. Subsequently it appears that in the past week he has noted or rather his wound nurse has noted that the appearance of the wound bed was much darker and did not appear to be nearly as healthy as what it was in the past. Therefore she actually wanted to set up this visit in order for me  to try to take a look. Unfortunately the video quality is not excellent and I really was not able to tell to what extent the wound may be different although it's also been several months since I last saw his wound he was being cared for by I Rosita Kea in the facility he was our nurse practitioner for skilled nursing facilities until just recently. Currently they have been using the Vashe which was previously instituted by myself when I last saw the patient in the clinic. 12/04/18 on evaluation today patient is seen by way of  telehealth visit at the nursing facility due to the fact that he has been quarantined by the facility simply in response to attempting to prevent any spread of Covid-19 Virus. If he comes out to the clinic he actually has this been two weeks in the [facility] and they really don't have the staff to do this. For that reason the trafficking patient's from going out if at all possible. Nonetheless the good news is he seems to be doing better according to what his nurse is telling me today. Measurement wise everything seemed to be significantly improved which was great news. Fortunately there's no evidence of active infection at this time. Patient states she's not having any significant pain which is excellent news he seems to be in good spirits. 12/21/18 upon evaluation today patient actually appears to be doing well with regard to his wound in the sacral region. He is seen by way of a telehealth visit with the nurse at his facility, [place]. With that being said his wound in particular according to the nurse appears to be doing well from the standpoint of the overall appearance of the wound bed. With that being said the measurements are also smaller today that she obtains which is excellent news. He doesn't seem to be having any pain and he tells me that he is attempting to offload more at this time as well which is good news that's definitely gonna be beneficial for him. No fevers, chills, nausea, or vomiting noted at this time. Electronic Signature(s) Signed: 12/22/2018 2:47:02 AM By: Lenda Kelp [name] Entered By: Lenda Kelp on 12/22/2018 02:42:09 Gruenewald, Karna Christmas ([id number]) -------------------------------------------------------------------------------- Physical Exam Details Patient Name: Fakhouri, Victoria E. Date of Service: 12/21/2018 8:00 AM Medical Record Number: [id number] Patient Account Number: [id number] Date of Birth/Sex: 28-Nov-1926 (85 y.o. M) Treating RN: Primary Care  Provider: Eleonore Chiquito Other Clinician: Referring Provider: Eleonore Chiquito Treating Provider/Extender: STONE III, HOYT Weeks in Treatment: 5 Constitutional Obese and well-hydrated in no acute distress. Ears, Nose, Mouth, and Throat no gross abnormality of ear auricles or external auditory canals. normal hearing noted during conversation. Respiratory normal breathing without difficulty. Psychiatric this patient is able to make decisions and demonstrates good insight into disease process. Alert and Oriented x 3. pleasant and cooperative. Notes Upon inspection as best I can tell to the video feed it appears that his wound is actually healing quite nicely which is excellent news. There does not appear to be any signs of active infection at this time and the granulation surface appears well again as best I can tell. Electronic Signature(s) Signed: 12/22/2018 2:47:02 AM By: Lenda Kelp [name] Entered By: Lenda Kelp on 12/22/2018 02:43:09 Frost, Karna Christmas ([id number]) -------------------------------------------------------------------------------- Physician Orders Details Patient Name: Sparkman, Mykah E. Date of Service: 12/21/2018 8:00 AM Medical Record Number: [id number] Patient Account Number: [id number] Date of Birth/Sex: December 15, 1926 (85 y.o. M) Treating RN: Primary Care Provider: Eleonore Chiquito  Other Clinician: Referring Provider: Eleonore Chiquito Treating Provider/Extender: Linwood Dibbles, HOYT Weeks in Treatment: 5 Verbal / Phone Orders: No Diagnosis Coding ICD-10 Coding Code Description L89.154 Pressure ulcer of sacral region, stage 4 M62.81 Muscle weakness (generalized) Wound Cleansing Wound #3 Midline Sacrum o Clean wound with Normal Saline. - Or house stock wound cleanser Primary Wound Dressing Wound #3 Midline Sacrum o Other: - Vashe soaked gauze packed into the wound Secondary Dressing Wound #3 Midline Sacrum o ABD pad - Secure with tape Dressing  Change Frequency Wound #3 Midline Sacrum o Change dressing every day. Follow-up Appointments Wound #3 Midline Sacrum o Return Appointment in 2 weeks. Off-Loading o Roho cushion for wheelchair o Mattress - Alternating air mattress o Turn and reposition every 2 hours Additional Orders / Instructions o Vitamin A; Vitamin C, Zinc o Increase protein intake. Electronic Signature(s) Signed: 12/22/2018 2:47:02 AM By: Lenda Kelp [name] Entered By: Lenda Kelp on 12/22/2018 02:43:39 Szalkowski, Karna Christmas ([id number]) -------------------------------------------------------------------------------- Problem List Details Patient Name: Goyal, Rubel E. Date of Service: 12/21/2018 8:00 AM Medical Record Number: [id number] Patient Account Number: [id number] Date of Birth/Sex: 04-Jan-1927 (85 y.o. M) Treating RN: Primary Care Provider: Eleonore Chiquito Other Clinician: Referring Provider: Eleonore Chiquito Treating Provider/Extender: Linwood Dibbles, HOYT Weeks in Treatment: 5 Active Problems ICD-10 Evaluated Encounter Code Description Active Date Today Diagnosis L89.154 Pressure ulcer of sacral region, stage 4 11/14/2018 No Yes M62.81 Muscle weakness (generalized) 11/14/2018 No Yes Inactive Problems Resolved Problems Electronic Signature(s) Signed: 12/22/2018 2:47:02 AM By: Lenda Kelp [name] Entered By: Lenda Kelp on 12/22/2018 02:41:57 Shinsato, Ernesto E. ([id number]) -------------------------------------------------------------------------------- Progress Note Details Patient Name: Depaoli, Milus E. Date of Service: 12/21/2018 8:00 AM Medical Record Number: [id number] Patient Account Number: [id number] Date of Birth/Sex: 1926/06/01 (85 y.o. M) Treating RN: Primary Care Provider: Eleonore Chiquito Other Clinician: Referring Provider: Eleonore Chiquito Treating Provider/Extender: Linwood Dibbles, HOYT Weeks in Treatment: 5 Subjective Chief Complaint Information obtained from  Patient Sacral pressure ulcer History of Present Illness (HPI) 04/20/17; this is an 85 year old man who is a bit of an unfortunate story. He was initially admitted to hospital from 9/10 through 9/14 for an elective left total knee replacement secondary to osteoarthritis. On 9/17 a nurse in the hospital noted non- blanchable erythema. The patient was discharged to [facility]. An admission note by Dr. Lyn Hollingshead on 01/31/17 noted him unstageable sacral decubitus ulcer. The patient was then admitted to hospital from 9/24 through 10/8 now with a sacral wound felt to be infected. The patient had fever and chills. Required in his surgical IandD. Blood cultures grew Clostridium Ramosium and Bacteroides fragilis. I am not able to see the Bacteroides in care everywhere today. Nevertheless the patient was seen by infectious disease and recommended for 6 weeks of ceftriaxone and Flagyl which should be completed. An x-ray in the hospital on 9/24 showed peroneal air which may be secondary to a decubitus ulcer no evidence for acute osseous or bone destructive change. An x-ray in the facility on 11/29 showed findings consistent with acute osteomyelitis of the distal coccyx.. Given the history here I suspect that this is probably not all acute and could have resulted at the time of infection the patient has a history of chronic atrial fibrillation on Xarelto, congestive heart failure with an EF of 40%, hypertension, BPH and osteoarthritis. He has been referred here for consideration of biopsy and culture of exposed coccyx It is difficult to follow the course of this patient's wound. Certainly he could  have had underlying osteo in the hospital that wasn't well identified by plane xray and could have had more resistant organisms that were identified. I think we should wait for the results of the bone bx and cx today before initiating any antibiotics. Readmission: 05/23/18 patient presents today for  reevaluation here in our clinic although it has been quite some time since he was last here. He is actually a patient of mine which I was seeing at [facility] facility. Subsequently he has a significant stage for pressure ulcer to the gluteal region which coupled with his muscle weakness is causing a significant issue at this point as far as healing is concerned. It appears that this patient likely has an infection as well in the wound bed. He actually arrived today with no dressing in place and there was actually evidence of fecal material in the wound bed. His daughter was present for the evaluation today as well as a CNA from the facility. The patient has had this wound since I first began seeing him on October 2018. I last saw him for evaluation on January 05, 2018. This was that [place] when I was still going to the nursing facilities. He said no on-site wound care from a provider other than that the facility since that time. He has not been on any recent antibiotics for the wound. The wound was significantly smaller when I last saw him measuring 3.5 x 2.5 x 3. Today this is much larger and deeper. There's also a significant amount of necrotic material which was not present when I last saw him. 05/30/18 on evaluation today patient appears to be doing excellent in regard to his sacral wound especially as compared to my last evaluation with him. He has been tolerating the dressing changes without complication and though there is some Slough noted there is really no significant bone noted exposed in the wound bed other than a small piece that is working itself out within some granulation tissue. Overall I feel like he's made excellent progress this wound appears to be night and day compared to what it looks like last week. 06/13/18 on evaluation today patient's sacral wound though large appears to be doing excellent. There's no sign of pressure injury and in general the tissue quality the  sacral region is excellent with great granulation there's no evidence of infection Lofgren, Reinaldo E. ([id number]) whatsoever. There is no odor. He also states that his pain is down from a 9-10 out of 10 two 2/10 that most but he did not even really react to me cleaning the wound today. 06/27/18 on evaluation today patient actually appears to be doing rather well in regard to his sacral wound. The overall appearance is improved and I'm very pleased in this regard. With that being said he continues to have discomfort and his daughter whom I did speak with today over the phone as well still has not had a chance to talk to the director of nursing regarding the Wound VAC because he and many of the staff have been out on sick leave due to the flu. 07/11/18 on evaluation today patient actually appears to be doing excellent in regard to his sacral words. He's been tolerating the dressing changes without complication overall I'm extremely pleased with how things appear today. He tells me that was her there would be a nurse practitioner coming to the facility that could see him on site. Again I explained that that is definitely something that is in the  works although [employer] not sure exactly which days she will be coming to this facility. Obviously depending on when and if we get this up and running that can definitely be a possibility for him to have first see him there and not have to come out to our office for evaluation and treatment. He would definitely like to consider this has with his daughter she states this was much easier on him. 11/13/18 on evaluation today patient was seen for a telehealth visit due to the Covid-19 Virus national emergency. Subsequently he is and is still nursing facility and his nurse at the facility who performs the wound care on a daily basis, Alcario Drought, is present during the evaluation. She did measure the wound dimensions for [place] as well today which was excellent and very helpful.  Subsequently it appears that in the past week he has noted or rather his wound nurse has noted that the appearance of the wound bed was much darker and did not appear to be nearly as healthy as what it was in the past. Therefore she actually wanted to set up this visit in order for me to try to take a look. Unfortunately the video quality is not excellent and I really was not able to tell to what extent the wound may be different although it's also been several months since I last saw his wound he was being cared for by I Rosita Kea in the facility he was our nurse practitioner for skilled nursing facilities until just recently. Currently they have been using the Vashe which was previously instituted by myself when I last saw the patient in the clinic. 12/04/18 on evaluation today patient is seen by way of telehealth visit at the nursing facility due to the fact that he has been quarantined by the facility simply in response to attempting to prevent any spread of Covid-19 Virus. If he comes out to the clinic he actually has this been two weeks in the [facility] and they really don't have the staff to do this. For that reason the trafficking patient's from going out if at all possible. Nonetheless the good news is he seems to be doing better according to what his nurse is telling me today. Measurement wise everything seemed to be significantly improved which was great news. Fortunately there's no evidence of active infection at this time. Patient states she's not having any significant pain which is excellent news he seems to be in good spirits. 12/21/18 upon evaluation today patient actually appears to be doing well with regard to his wound in the sacral region. He is seen by way of a telehealth visit with the nurse at his facility, [place] [facility]. With that being said his wound in particular according to the nurse appears to be doing well from the standpoint of the overall appearance of the wound  bed. With that being said the measurements are also smaller today that she obtains which is excellent news. He doesn't seem to be having any pain and he tells me that he is attempting to offload more at this time as well which is good news that's definitely gonna be beneficial for him. No fevers, chills, nausea, or vomiting noted at this time. Patient History Information obtained from Patient. Allergies NSAIDS (Non-Steroidal Anti-Inflammatory Drug), Vicodin, morphine Family History Heart Disease - Mother,Father, Hypertension - Mother,Father, No family history of Cancer, Diabetes, Hereditary Spherocytosis, Kidney Disease, Lung Disease, Seizures, Stroke, Thyroid Problems, Tuberculosis. Social History Former smoker - quit in 1996, Marital Status -  Widowed, Alcohol Use - Never, Drug Use - No History, Caffeine Use - Daily. Medical History Eyes Waage, Mccall E. ([id number]) Denies history of Cataracts, Glaucoma, Optic Neuritis Hematologic/Lymphatic Denies history of Anemia, Hemophilia, Human Immunodeficiency Virus, Lymphedema, Sickle Cell Disease Respiratory Patient has history of Chronic Obstructive Pulmonary Disease (COPD) Cardiovascular Patient has history of Congestive Heart Failure, Hypertension Genitourinary Denies history of End Stage Renal Disease Musculoskeletal Patient has history of Osteoarthritis Denies history of Osteomyelitis Oncologic Denies history of Received Chemotherapy, Received Radiation Medical And Surgical History Notes Eyes macular degeneration Respiratory continuous O2 Review of Systems (ROS) Constitutional Symptoms (General Health) Denies complaints or symptoms of Fatigue, Fever, Chills, Marked Weight Change. Respiratory Denies complaints or symptoms of Chronic or frequent coughs, Shortness of Breath. Cardiovascular Denies complaints or symptoms of Chest pain, LE edema. Psychiatric Denies complaints or symptoms of Anxiety,  Claustrophobia. Objective Constitutional Obese and well-hydrated in no acute distress. Ears, Nose, Mouth, and Throat no gross abnormality of ear auricles or external auditory canals. normal hearing noted during conversation. Respiratory normal breathing without difficulty. Psychiatric this patient is able to make decisions and demonstrates good insight into disease process. Alert and Oriented x 3. pleasant and cooperative. General Notes: Upon inspection as best I can tell to the video feed it appears that his wound is actually healing quite nicely which is excellent news. There does not appear to be any signs of active infection at this time and the granulation surface appears well again as best I can tell. Gawthrop, Jade E. ([id number]) Integumentary (Hair, Skin) Wound #3 status is Open. Original cause of wound was Pressure Injury. The wound is located on the Midline Sacrum. The wound measures 1.8cm length x 1.2cm width x 2cm depth; 1.696cm^2 area and 3.393cm^3 volume. There is Fat Layer (Subcutaneous Tissue) Exposed exposed. There is no tunneling or undermining noted. There is a small amount of purulent drainage noted. The wound margin is thickened. There is large (67-100%) red granulation within the wound bed. There is no necrotic tissue within the wound bed. Assessment Active Problems ICD-10 Pressure ulcer of sacral region, stage 4 Muscle weakness (generalized) Plan Wound Cleansing: Wound #3 Midline Sacrum: Clean wound with Normal Saline. - Or house stock wound cleanser Primary Wound Dressing: Wound #3 Midline Sacrum: Other: - Vashe soaked gauze packed into the wound Secondary Dressing: Wound #3 Midline Sacrum: ABD pad - Secure with tape Dressing Change Frequency: Wound #3 Midline Sacrum: Change dressing every day. Follow-up Appointments: Wound #3 Midline Sacrum: Return Appointment in 2 weeks. Off-Loading: Roho cushion for wheelchair Mattress - Alternating air  mattress Turn and reposition every 2 hours Additional Orders / Instructions: Vitamin A; Vitamin C, Zinc Increase protein intake. 1. At this point I'm gonna recommend that we continue with the Vashe soaked gauze dressing this seems to be beneficial. This will be secured with an ABD pad and tape 2. I'm gonna recommend that the patient continued offload as much as possible stay no longer than two hours on anyone position especially on his back directly. Shavers, Roston E. ([id number]) Please see above for specific wound care orders. We will see patient for re-evaluation in 2 week(s) here in the clinic. If anything worsens or changes patient will contact our office for additional recommendations. Electronic Signature(s) Signed: 12/22/2018 2:47:02 AM By: Lenda KelpStone III, Hoyt [name] Entered By: Lenda KelpStone III, Hoyt on 12/22/2018 02:46:38 Antonacci, Karna ChristmasEVERETT E. ([id number]) -------------------------------------------------------------------------------- ROS/PFSH Details Patient Name: Conran, Cloys E. Date of Service: 12/21/2018 8:00 AM Medical Record Number: [id number] Patient Account Number:  [id number] Date of Birth/Sex: 07-22-1926 (85 y.o. M) Treating RN: Primary Care Provider: Eleonore Chiquito Other Clinician: Referring Provider: Eleonore Chiquito Treating Provider/Extender: Linwood Dibbles, HOYT Weeks in Treatment: 5 Information Obtained From Patient Constitutional Symptoms (General Health) Complaints and Symptoms: Negative for: Fatigue; Fever; Chills; Marked Weight Change Respiratory Complaints and Symptoms: Negative for: Chronic or frequent coughs; Shortness of Breath Medical History: Positive for: Chronic Obstructive Pulmonary Disease (COPD) Past Medical History Notes: continuous O2 Cardiovascular Complaints and Symptoms: Negative for: Chest pain; LE edema Medical History: Positive for: Congestive Heart Failure; Hypertension Psychiatric Complaints and Symptoms: Negative for: Anxiety;  Claustrophobia Eyes Medical History: Negative for: Cataracts; Glaucoma; Optic Neuritis Past Medical History Notes: macular degeneration Hematologic/Lymphatic Medical History: Negative for: Anemia; Hemophilia; Human Immunodeficiency Virus; Lymphedema; Sickle Cell Disease Genitourinary Medical History: Negative for: End Stage Renal Disease Musculoskeletal Cirilo, Nasire E. ([id number]) Medical History: Positive for: Osteoarthritis Negative for: Osteomyelitis Oncologic Medical History: Negative for: Received Chemotherapy; Received Radiation Immunizations Pneumococcal Vaccine: Received Pneumococcal Vaccination: Yes Immunization Notes: up to date Implantable Devices None Family and Social History Cancer: No; Diabetes: No; Heart Disease: Yes - Mother,Father; Hereditary Spherocytosis: No; Hypertension: Yes - Mother,Father; Kidney Disease: No; Lung Disease: No; Seizures: No; Stroke: No; Thyroid Problems: No; Tuberculosis: No; Former smoker - quit in 1996; Marital Status - Widowed; Alcohol Use: Never; Drug Use: No History; Caffeine Use: Daily; Financial Concerns: No; Food, Clothing or Shelter Needs: No; Support System Lacking: No; Transportation Concerns: No Physician Affirmation I have reviewed and agree with the above information. Electronic Signature(s) Signed: 12/22/2018 2:47:02 AM By: Lenda Kelp [name] Entered By: Lenda Kelp on 12/22/2018 02:41:40 Beaudoin, Karna Christmas ([id number]) -------------------------------------------------------------------------------- SuperBill Details Patient Name: Peedin, Roby E. Date of Service: 12/21/2018 Medical Record Number: [id number] Patient Account Number: [id number] Date of Birth/Sex: 11-10-1926 (85 y.o. M) Treating RN: Primary Care Provider: Eleonore Chiquito Other Clinician: Referring Provider: Eleonore Chiquito Treating Provider/Extender: Linwood Dibbles, HOYT Weeks in Treatment: 5 Diagnosis Coding ICD-10 Codes Code Description L89.154  Pressure ulcer of sacral region, stage 4 M62.81 Muscle weakness (generalized) Physician Procedures CPT4 Code: [id number] Description: 99213 - WC PHYS LEVEL 3 - EST PT ICD-10 Diagnosis Description L89.154 Pressure ulcer of sacral region, stage 4 M62.81 Muscle weakness (generalized) Modifier: Quantity: 1 Electronic Signature(s) Signed: 12/22/2018 2:47:02 AM By: Lenda Kelp [name] Entered By: Lenda Kelp on 12/22/2018 02:45:17

## 2019-01-08 ENCOUNTER — Ambulatory Visit: Payer: Medicare Other | Admitting: Physician Assistant

## 2019-01-11 ENCOUNTER — Encounter: Payer: Medicare Other | Admitting: Physician Assistant

## 2019-01-11 ENCOUNTER — Other Ambulatory Visit: Payer: Self-pay

## 2019-01-11 NOTE — Progress Notes (Addendum)
[name], [name] ([id number]) Visit Report for 01/11/2019 Chief Complaint Document Details Patient Name: [name], [name] Date of Service: 01/11/2019 4:00 PM Medical Record Number: [id number] Patient Account Number: [id number] Date of Birth/Sex: 1927/04/11 (85 y.o. M) Treating RN: Rodell Perna Primary Care Provider: Eleonore Chiquito Other Clinician: Referring Provider: Eleonore Chiquito Treating Provider/Extender: Linwood Dibbles, Varie Machamer Weeks in Treatment: 8 Information Obtained from: Patient Chief Complaint Sacral pressure ulcer Electronic Signature(s) Signed: 01/11/2019 4:39:46 PM By: Lenda Kelp [name] Entered By: Lenda Kelp on 01/11/2019 16:39:46 [name], [name] ([id number]) -------------------------------------------------------------------------------- HPI Details Patient Name: [name], [name] Date of Service: 01/11/2019 4:00 PM Medical Record Number: [id number] Patient Account Number: [id number] Date of Birth/Sex: [birth year] (85 y.o. M) Treating RN: Rodell Perna Primary Care Provider: Eleonore Chiquito Other Clinician: Referring Provider: Eleonore Chiquito Treating Provider/Extender: Linwood Dibbles, Donaldo Teegarden Weeks in Treatment: 8 History of Present Illness HPI Description: 04/20/17; this is an 85 year old man who is a bit of an unfortunate story. He was initially admitted to hospital from 9/10 through 9/14 for an elective left total knee replacement secondary to osteoarthritis. On 9/17 a nurse in the hospital noted non-blanchable erythema. The patient was discharged to [facility] skilled facility. An admission note by Dr. Lyn Hollingshead on 01/31/17 noted him unstageable sacral decubitus ulcer. The patient was then admitted to hospital from 9/24 through 10/8 now with a sacral wound felt to be infected. The patient had fever and chills. Required in his surgical IandD. Blood cultures grew Clostridium Ramosium and Bacteroides fragilis. I am not able to see the Bacteroides in care everywhere  today. Nevertheless the patient was seen by infectious disease and recommended for 6 weeks of ceftriaxone and Flagyl which should be completed. An x-ray in the hospital on 9/24 showed peroneal air which may be secondary to a decubitus ulcer no evidence for acute osseous or bone destructive change. An x-ray in the facility on 11/29 showed findings consistent with acute osteomyelitis of the distal coccyx.. Given the history here I suspect that this is probably not all acute and could have resulted at the time of infection the patient has a history of chronic atrial fibrillation on Xarelto, congestive heart failure with an EF of 40%, hypertension, BPH and osteoarthritis. He has been referred here for consideration of biopsy and culture of exposed coccyx It is difficult to follow the course of this patient's wound. Certainly he could have had underlying osteo in the hospital that wasn't well identified by plane xray and could have had more resistant organisms that were identified. I think we should wait for the results of the bone bx and cx today before initiating any antibiotics. Readmission: 05/23/18 patient presents today for reevaluation here in our clinic although it has been quite some time since he was last here. He is actually a patient of mine which I was seeing at [facility] nursing facility. Subsequently he has a significant stage for pressure ulcer to the gluteal region which coupled with his muscle weakness is causing a significant issue at this point as far as healing is concerned. It appears that this patient likely has an infection as well in the wound bed. He actually arrived today with no dressing in place and there was actually evidence of fecal material in the wound bed. His daughter was present for the evaluation today as well as a CNA from the facility. The patient has had this wound since I first began seeing him on October 2018. I last saw him for evaluation on January 05, 2018.  This was that [place] when I was still going to the nursing facilities. He said no on-site wound care from a provider other than that the facility since that time. He has not been on any recent antibiotics for the wound. The wound was significantly smaller when I last saw him measuring 3.5 x 2.5 x 3. Today this is much larger and deeper. There's also a significant amount of necrotic material which was not present when I last saw him. 05/30/18 on evaluation today patient appears to be doing excellent in regard to his sacral wound especially as compared to my last evaluation with him. He has been tolerating the dressing changes without complication and though there is some Slough noted there is really no significant bone noted exposed in the wound bed other than a small piece that is working itself out within some granulation tissue. Overall I feel like he's made excellent progress this wound appears to be night and day compared to what it looks like last week. 06/13/18 on evaluation today patient's sacral wound though large appears to be doing excellent. There's no sign of pressure injury and in general the tissue quality the sacral region is excellent with great granulation there's no evidence of infection whatsoever. There is no odor. He also states that his pain is down from a 9-10 out of 10 two 2/10 that most but he did not even really react to me cleaning the wound today. 06/27/18 on evaluation today patient actually appears to be doing rather well in regard to his sacral wound. The overall appearance is improved and I'm very pleased in this regard. With that being said he continues to have discomfort and his daughter whom I did speak with today over the phone as well still has not had a chance to talk to the director of nursing [name], [name]. ([phone]) regarding the [facility] because he and many of the staff have been out on sick leave due to the flu. 07/11/18 on evaluation today patient  actually appears to be doing excellent in regard to his sacral words. He's been tolerating the dressing changes without complication overall I'm extremely pleased with how things appear today. He tells me that was her there would be a nurse practitioner coming to the facility that could see him on site. Again I explained that that is definitely something that is in the works although I'm not sure exactly which days she will be coming to this facility. Obviously depending on when and if we get this up and running that can definitely be a possibility for him to have first see him there and not have to come out to our office for evaluation and treatment. He would definitely like to consider this has with his daughter she states this was much easier on him. 11/13/18 on evaluation today patient was seen for a telehealth visit due to the Covid-19 Virus national emergency. Subsequently he is and is still nursing facility and his nurse at the facility who performs the wound care on a daily basis, Danae Chen, is present during the evaluation. She did measure the wound dimensions for [place] as well today which was excellent and very helpful. Subsequently it appears that in the past week he has noted or rather his wound nurse has noted that the appearance of the wound bed was much darker and did not appear to be nearly as healthy as what it was in the past. Therefore she actually wanted to set up this visit  in order for me to try to take a look. Unfortunately the video quality is not excellent and I really was not able to tell to what extent the wound may be different although it's also been several months since I last saw his wound he was being cared for by I Rosita KeaKeisha Knight in the facility he was our nurse practitioner for skilled nursing facilities until just recently. Currently they have been using the Vashe which was previously instituted by myself when I last saw the patient in the clinic. 12/04/18 on evaluation  today patient is seen by way of telehealth visit at the nursing facility due to the fact that he has been quarantined by the facility simply in response to attempting to prevent any spread of Covid-19 Virus. If he comes out to the clinic he actually has this been two weeks in the [facility] and they really don't have the staff to do this. For that reason the trafficking patient's from going out if at all possible. Nonetheless the good news is he seems to be doing better according to what his nurse is telling me today. Measurement wise everything seemed to be significantly improved which was great news. Fortunately there's no evidence of active infection at this time. Patient states she's not having any significant pain which is excellent news he seems to be in good spirits. 12/21/18 upon evaluation today patient actually appears to be doing well with regard to his wound in the sacral region. He is seen by way of a telehealth visit with the nurse at his facility, [facility]. With that being said his wound in particular according to the nurse appears to be doing well from the standpoint of the overall appearance of the wound bed. With that being said the measurements are also smaller today that she obtains which is excellent news. He doesn't seem to be having any pain and he tells me that he is attempting to offload more at this time as well which is good news that's definitely gonna be beneficial for him. No fevers, chills, nausea, or vomiting noted at this time. 01/11/2019 on evaluation today patient actually appears to be doing well with regard to sacral ulcer. He has been tolerating the dressing changes without complication. Fortunately there is no sign of active infection at this time. He has been tolerating offloading as well. It does sound like he is actually done a little bit more of this currently which is at least good news. Overall I am very happy in that regard. Electronic  Signature(s) Signed: 01/11/2019 5:30:18 PM By: Lenda KelpStone III, Cristella Stiver [name] Entered By: Lenda KelpStone III, Cord Wilczynski on 01/11/2019 17:30:17 [name], [name] ChristmasEVERETT E. ([id number]) -------------------------------------------------------------------------------- Physical Exam Details Patient Name: Pha, Gustav E. Date of Service: 01/11/2019 4:00 PM Medical Record Number: [id number] Patient Account Number: [id number] Date of Birth/Sex: 07/31/1926 (85 y.o. M) Treating RN: Rodell PernaScott, Dajea Primary Care Provider: Eleonore ChiquitoKWIATKOWSKI, PETER Other Clinician: Referring Provider: Eleonore ChiquitoKWIATKOWSKI, PETER Treating Provider/Extender: STONE III, Chontel Warning Weeks in Treatment: 8 Constitutional Well-nourished and well-hydrated in no acute distress. Eyes conjunctiva clear no eyelid edema noted. Ears, Nose, Mouth, and Throat normal hearing noted during conversation. Respiratory normal breathing without difficulty. Psychiatric this patient is able to make decisions and demonstrates good insight into disease process. Alert and Oriented x 3. pleasant and cooperative. Notes Patient's wound bed currently showed signs of good granulation at this time there does not appear to be any evidence of infection which is good news. No fevers, chills, nausea, vomiting, or diarrhea. Electronic Signature(s) Signed:  01/11/2019 5:30:43 PM By: Lenda KelpStone III, Carlo Guevarra [name] Entered By: Lenda KelpStone III, Jennalyn Cawley on 01/11/2019 17:30:43 [name], [name] ChristmasEVERETT E. ([id number]) -------------------------------------------------------------------------------- Physician Orders Details Patient Name: [name], [name] Date of Service: 01/11/2019 4:00 PM Medical Record Number: [id number] Patient Account Number: [id number] Date of Birth/Sex: April 28, 1927 (85 y.o. M) Treating RN: Rodell PernaScott, Dajea Primary Care Provider: Eleonore ChiquitoKWIATKOWSKI, PETER Other Clinician: Referring Provider: Eleonore ChiquitoKWIATKOWSKI, PETER Treating Provider/Extender: Linwood DibblesSTONE III, Carloyn Lahue Weeks in Treatment: 8 Verbal / Phone Orders: No Diagnosis Coding ICD-10  Coding Code Description L89.154 Pressure ulcer of sacral region, stage 4 M62.81 Muscle weakness (generalized) Wound Cleansing Wound #3 Midline Sacrum o Clean wound with Normal Saline. - Or house stock wound cleanser Primary Wound Dressing Wound #3 Midline Sacrum o Other: - Vashe soaked gauze packed into the wound Secondary Dressing Wound #3 Midline Sacrum o ABD pad - Secure with tape Dressing Change Frequency Wound #3 Midline Sacrum o Change dressing every day. Follow-up Appointments Wound #3 Midline Sacrum o Return Appointment in 2 weeks. Off-Loading o Roho cushion for wheelchair o Mattress - Alternating air mattress o Turn and reposition every 2 hours Additional Orders / Instructions o Vitamin A; Vitamin C, Zinc o Increase protein intake. Notes Fax orders to [phone] Attn: Costella Hatcherawn Nelson Electronic Signature(s) Signed: 01/11/2019 4:49:17 PM By: Lenda KelpStone III, Swain Acree [name] Entered By: Lenda KelpStone III, Denisse Whitenack on 01/11/2019 16:49:17 Fleishman, [name] ChristmasEVERETT E. ([id number]) -------------------------------------------------------------------------------- Problem List Details Patient Name: Hargan, Josiyah E. Date of Service: 01/11/2019 4:00 PM Medical Record Number: [id number] Patient Account Number: [id number] Date of Birth/Sex: April 28, 1927 (85 y.o. M) Treating RN: Rodell PernaScott, Dajea Primary Care Provider: Eleonore ChiquitoKWIATKOWSKI, PETER Other Clinician: Referring Provider: Eleonore ChiquitoKWIATKOWSKI, PETER Treating Provider/Extender: Linwood DibblesSTONE III, Azuree Minish Weeks in Treatment: 8 Active Problems ICD-10 Evaluated Encounter Code Description Active Date Today Diagnosis L89.154 Pressure ulcer of sacral region, stage 4 11/14/2018 No Yes M62.81 Muscle weakness (generalized) 11/14/2018 No Yes Inactive Problems Resolved Problems Electronic Signature(s) Signed: 01/11/2019 4:37:52 PM By: Lenda KelpStone III, Tamarah Bhullar [name] Entered By: Lenda KelpStone III, Aylee Littrell on 01/11/2019 16:37:52 [name], [name]  ([id number]) -------------------------------------------------------------------------------- Progress Note Details Patient Name: [name], [name] Date of Service: 01/11/2019 4:00 PM Medical Record Number: [id number] Patient Account Number: [id number] Date of Birth/Sex: April 28, 1927 (85 y.o. M) Treating RN: Rodell PernaScott, Dajea Primary Care Provider: Eleonore ChiquitoKWIATKOWSKI, PETER Other Clinician: Referring Provider: Eleonore ChiquitoKWIATKOWSKI, PETER Treating Provider/Extender: Linwood DibblesSTONE III, Keyly Baldonado Weeks in Treatment: 8 Subjective Chief Complaint Information obtained from Patient Sacral pressure ulcer History of Present Illness (HPI) 04/20/17; this is an 85 year old man who is a bit of an unfortunate story. He was initially admitted to hospital from 9/10 through 9/14 for an elective left total knee replacement secondary to osteoarthritis. On 9/17 a nurse in the hospital noted non- blanchable erythema. The patient was discharged to [facility] skilled facility. An admission note by Dr. Lyn HollingsheadAlexander on 01/31/17 noted him unstageable sacral decubitus ulcer. The patient was then admitted to hospital from 9/24 through 10/8 now with a sacral wound felt to be infected. The patient had fever and chills. Required in his surgical IandD. Blood cultures grew Clostridium Ramosium and Bacteroides fragilis. I am not able to see the Bacteroides in care everywhere today. Nevertheless the patient was seen by infectious disease and recommended for 6 weeks of ceftriaxone and Flagyl which should be completed. An x-ray in the hospital on 9/24 showed peroneal air which may be secondary to a decubitus ulcer no evidence for acute osseous or bone destructive change. An x-ray in the facility on 11/29 showed findings consistent with acute osteomyelitis of the distal coccyx.. Given the  history here I suspect that this is probably not all acute and could have resulted at the time of infection the patient has a history of chronic atrial fibrillation on Xarelto,  congestive heart failure with an EF of 40%, hypertension, BPH and osteoarthritis. He has been referred here for consideration of biopsy and culture of exposed coccyx It is difficult to follow the course of this patient's wound. Certainly he could have had underlying osteo in the hospital that wasn't well identified by plane xray and could have had more resistant organisms that were identified. I think we should wait for the results of the bone bx and cx today before initiating any antibiotics. Readmission: 05/23/18 patient presents today for reevaluation here in our clinic although it has been quite some time since he was last here. He is actually a patient of mine which I was seeing at [facility] nursing facility. Subsequently he has a significant stage for pressure ulcer to the gluteal region which coupled with his muscle weakness is causing a significant issue at this point as far as healing is concerned. It appears that this patient likely has an infection as well in the wound bed. He actually arrived today with no dressing in place and there was actually evidence of fecal material in the wound bed. His daughter was present for the evaluation today as well as a CNA from the facility. The patient has had this wound since I first began seeing him on October 2018. I last saw him for evaluation on January 05, 2018. This was that [place] when I was still going to the nursing facilities. He said no on-site wound care from a provider other than that the facility since that time. He has not been on any recent antibiotics for the wound. The wound was significantly smaller when I last saw him measuring 3.5 x 2.5 x 3. Today this is much larger and deeper. There's also a significant amount of necrotic material which was not present when I last saw him. 05/30/18 on evaluation today patient appears to be doing excellent in regard to his sacral wound especially as compared to my last evaluation with him. He  has been tolerating the dressing changes without complication and though there is some Slough noted there is really no significant bone noted exposed in the wound bed other than a small piece that is working itself out within some granulation tissue. Overall I feel like he's made excellent progress this wound appears to be night and day compared to what it looks like last week. 06/13/18 on evaluation today patient's sacral wound though large appears to be doing excellent. There's no sign of pressure injury and in general the tissue quality the sacral region is excellent with great granulation there's no evidence of infection [name], [name] ([id number]) whatsoever. There is no odor. He also states that his pain is down from a 9-10 out of 10 two 2/10 that most but he did not even really react to me cleaning the wound today. 06/27/18 on evaluation today patient actually appears to be doing rather well in regard to his sacral wound. The overall appearance is improved and I'm very pleased in this regard. With that being said he continues to have discomfort and his daughter whom I did speak with today over the phone as well still has not had a chance to talk to the director of nursing regarding the [facility] because he and many of the staff have been out on sick leave due  to the flu. 07/11/18 on evaluation today patient actually appears to be doing excellent in regard to his sacral words. He's been tolerating the dressing changes without complication overall I'm extremely pleased with how things appear today. He tells me that was her there would be a nurse practitioner coming to the facility that could see him on site. Again I explained that that is definitely something that is in the works although I'm not sure exactly which days she will be coming to this facility. Obviously depending on when and if we get this up and running that can definitely be a possibility for him to have first see him there  and not have to come out to our office for evaluation and treatment. He would definitely like to consider this has with his daughter she states this was much easier on him. 11/13/18 on evaluation today patient was seen for a telehealth visit due to the Covid-19 Virus national emergency. Subsequently he is and is still nursing facility and his nurse at the facility who performs the wound care on a daily basis, Alcario Drought, is present during the evaluation. She did measure the wound dimensions for [place] as well today which was excellent and very helpful. Subsequently it appears that in the past week he has noted or rather his wound nurse has noted that the appearance of the wound bed was much darker and did not appear to be nearly as healthy as what it was in the past. Therefore she actually wanted to set up this visit in order for me to try to take a look. Unfortunately the video quality is not excellent and I really was not able to tell to what extent the wound may be different although it's also been several months since I last saw his wound he was being cared for by I Rosita Kea in the facility he was our nurse practitioner for skilled nursing facilities until just recently. Currently they have been using the Vashe which was previously instituted by myself when I last saw the patient in the clinic. 12/04/18 on evaluation today patient is seen by way of telehealth visit at the nursing facility due to the fact that he has been quarantined by the facility simply in response to attempting to prevent any spread of Covid-19 Virus. If he comes out to the clinic he actually has this been two weeks in the [facility] and they really don't have the staff to do this. For that reason the trafficking patient's from going out if at all possible. Nonetheless the good news is he seems to be doing better according to what his nurse is telling me today. Measurement wise everything seemed to be significantly improved  which was great news. Fortunately there's no evidence of active infection at this time. Patient states she's not having any significant pain which is excellent news he seems to be in good spirits. 12/21/18 upon evaluation today patient actually appears to be doing well with regard to his wound in the sacral region. He is seen by way of a telehealth visit with the nurse at his facility, [place]. With that being said his wound in particular according to the nurse appears to be doing well from the standpoint of the overall appearance of the wound bed. With that being said the measurements are also smaller today that she obtains which is excellent news. He doesn't seem to be having any pain and he tells me that he is attempting to offload more at this time as  well which is good news that's definitely gonna be beneficial for him. No fevers, chills, nausea, or vomiting noted at this time. 01/11/2019 on evaluation today patient actually appears to be doing well with regard to sacral ulcer. He has been tolerating the dressing changes without complication. Fortunately there is no sign of active infection at this time. He has been tolerating offloading as well. It does sound like he is actually done a little bit more of this currently which is at least good news. Overall I am very happy in that regard. Patient History Information obtained from Patient. Allergies NSAIDS (Non-Steroidal Anti-Inflammatory Drug), Vicodin, morphine Family History Heart Disease - Mother,Father, Hypertension - Mother,Father, No family history of Cancer, Diabetes, Hereditary Spherocytosis, Kidney Disease, Lung Disease, Seizures, Stroke, Thyroid Problems, Tuberculosis. Chronis, AERO DRUMMONDS. ([id number]) Social History Former smoker - quit in 1996, Marital Status - Widowed, Alcohol Use - Never, Drug Use - No History, Caffeine Use - Daily. Medical History Eyes Denies history of Cataracts, Glaucoma, Optic  Neuritis Hematologic/Lymphatic Denies history of Anemia, Hemophilia, Human Immunodeficiency Virus, Lymphedema, Sickle Cell Disease Respiratory Patient has history of Chronic Obstructive Pulmonary Disease (COPD) Cardiovascular Patient has history of Congestive Heart Failure, Hypertension Genitourinary Denies history of End Stage Renal Disease Musculoskeletal Patient has history of Osteoarthritis Denies history of Osteomyelitis Oncologic Denies history of Received Chemotherapy, Received Radiation Medical And Surgical History Notes Eyes macular degeneration Respiratory continuous O2 Review of Systems (ROS) Constitutional Symptoms (General Health) Denies complaints or symptoms of Fatigue, Fever, Chills, Marked Weight Change. Respiratory Denies complaints or symptoms of Chronic or frequent coughs, Shortness of Breath. Cardiovascular Denies complaints or symptoms of Chest pain, LE edema. Psychiatric Denies complaints or symptoms of Anxiety, Claustrophobia. Objective Constitutional Well-nourished and well-hydrated in no acute distress. Eyes conjunctiva clear no eyelid edema noted. Ears, Nose, Mouth, and Throat normal hearing noted during conversation. Respiratory normal breathing without difficulty. Psychiatric Rome, Ishmel E. ([id number]) this patient is able to make decisions and demonstrates good insight into disease process. Alert and Oriented x 3. pleasant and cooperative. General Notes: Patient's wound bed currently showed signs of good granulation at this time there does not appear to be any evidence of infection which is good news. No fevers, chills, nausea, vomiting, or diarrhea. Integumentary (Hair, Skin) Wound #3 status is Open. Original cause of wound was Pressure Injury. The wound is located on the Midline Sacrum. The wound measures 1.4cm length x 1.8cm width x 2cm depth; 1.979cm^2 area and 3.958cm^3 volume. There is Fat Layer (Subcutaneous Tissue) Exposed exposed.  There is no tunneling or undermining noted. There is a small amount of purulent drainage noted. The wound margin is thickened. There is large (67-100%) red granulation within the wound bed. There is no necrotic tissue within the wound bed. Assessment Active Problems ICD-10 Pressure ulcer of sacral region, stage 4 Muscle weakness (generalized) Plan Wound Cleansing: Wound #3 Midline Sacrum: Clean wound with Normal Saline. - Or house stock wound cleanser Primary Wound Dressing: Wound #3 Midline Sacrum: Other: - Vashe soaked gauze packed into the wound Secondary Dressing: Wound #3 Midline Sacrum: ABD pad - Secure with tape Dressing Change Frequency: Wound #3 Midline Sacrum: Change dressing every day. Follow-up Appointments: Wound #3 Midline Sacrum: Return Appointment in 2 weeks. Off-Loading: Roho cushion for wheelchair Mattress - Alternating air mattress Turn and reposition every 2 hours Additional Orders / Instructions: Vitamin A; Vitamin C, Zinc Increase protein intake. General Notes: Fax orders to [phone] Attn: Costella Hatcher [name], [name] ([id number]) At this time I  would recommend that we continue with the Vasche soaked gauze dressing since the patient seems to be doing well with this. Overall I am pleased with the progress he is making it slow but nonetheless does seem to be making some steady improvement. We will subsequently see where things stand at follow-up. I will do a telehealth visit with him in 2 weeks to recheck and see where we are at. In the meantime I explained to the patient he needs to continue to try to offload as much as possible to keep pressure off of the area. Electronic Signature(s) Signed: 01/11/2019 5:31:38 PM By: Lenda KelpStone III, Curtis Uriarte [name] Entered By: Lenda KelpStone III, Hani Patnode on 01/11/2019 17:31:38 Macknight, [name] ChristmasEVERETT E. ([id number]) -------------------------------------------------------------------------------- ROS/PFSH Details Patient Name: Rands, Jasmine E. Date  of Service: 01/11/2019 4:00 PM Medical Record Number: [id number] Patient Account Number: [id number] Date of Birth/Sex: 1927/04/05 (85 y.o. M) Treating RN: Rodell PernaScott, Dajea Primary Care Provider: Eleonore ChiquitoKWIATKOWSKI, PETER Other Clinician: Referring Provider: Eleonore ChiquitoKWIATKOWSKI, PETER Treating Provider/Extender: Linwood DibblesSTONE III, Irl Bodie Weeks in Treatment: 8 Information Obtained From Patient Constitutional Symptoms (General Health) Complaints and Symptoms: Negative for: Fatigue; Fever; Chills; Marked Weight Change Respiratory Complaints and Symptoms: Negative for: Chronic or frequent coughs; Shortness of Breath Medical History: Positive for: Chronic Obstructive Pulmonary Disease (COPD) Past Medical History Notes: continuous O2 Cardiovascular Complaints and Symptoms: Negative for: Chest pain; LE edema Medical History: Positive for: Congestive Heart Failure; Hypertension Psychiatric Complaints and Symptoms: Negative for: Anxiety; Claustrophobia Eyes Medical History: Negative for: Cataracts; Glaucoma; Optic Neuritis Past Medical History Notes: macular degeneration Hematologic/Lymphatic Medical History: Negative for: Anemia; Hemophilia; Human Immunodeficiency Virus; Lymphedema; Sickle Cell Disease Genitourinary Medical History: Negative for: End Stage Renal Disease Musculoskeletal Willy, Glyndon E. ([id number]) Medical History: Positive for: Osteoarthritis Negative for: Osteomyelitis Oncologic Medical History: Negative for: Received Chemotherapy; Received Radiation Immunizations Pneumococcal Vaccine: Received Pneumococcal Vaccination: Yes Immunization Notes: up to date Implantable Devices None Family and Social History Cancer: No; Diabetes: No; Heart Disease: Yes - Mother,Father; Hereditary Spherocytosis: No; Hypertension: Yes - Mother,Father; Kidney Disease: No; Lung Disease: No; Seizures: No; Stroke: No; Thyroid Problems: No; Tuberculosis: No; Former smoker - quit in 1996; Marital Status -  Widowed; Alcohol Use: Never; Drug Use: No History; Caffeine Use: Daily; Financial Concerns: No; Food, Clothing or Shelter Needs: No; Support System Lacking: No; Transportation Concerns: No Physician Affirmation I have reviewed and agree with the above information. Electronic Signature(s) Signed: 01/12/2019 9:37:06 AM By: Rodell PernaScott, Dajea Signed: 01/14/2019 6:59:38 PM By: Lenda KelpStone III, Josefita Weissmann [name] Entered By: Lenda KelpStone III, Chaunta Bejarano on 01/11/2019 16:39:36 Boyte, Dyllon E. ([id number]) -------------------------------------------------------------------------------- SuperBill Details Patient Name: Marik, Salbador E. Date of Service: 01/11/2019 Medical Record Number: [id number] Patient Account Number: [id number] Date of Birth/Sex: 1927/04/05 (85 y.o. M) Treating RN: Rodell PernaScott, Dajea Primary Care Provider: Eleonore ChiquitoKWIATKOWSKI, PETER Other Clinician: Referring Provider: Eleonore ChiquitoKWIATKOWSKI, PETER Treating Provider/Extender: Linwood DibblesSTONE III, Dolores Mcgovern Weeks in Treatment: 8 Diagnosis Coding ICD-10 Codes Code Description L89.154 Pressure ulcer of sacral region, stage 4 M62.81 Muscle weakness (generalized) Physician Procedures CPT4 Code: [id number] Description: 99213 - WC PHYS LEVEL 3 - EST PT ICD-10 Diagnosis Description L89.154 Pressure ulcer of sacral region, stage 4 M62.81 Muscle weakness (generalized) Modifier: Quantity: 1 Electronic Signature(s) Signed: 01/11/2019 5:39:56 PM By: Lenda KelpStone III, Nikola Marone [name] Entered By: Lenda KelpStone III, Jeray Shugart on 01/11/2019 17:39:56

## 2019-01-11 NOTE — Progress Notes (Addendum)
Morones, ARIF AMENDOLA ([id number]) Visit Report for 01/11/2019 Allergy List Details Patient Name: Gieselman, Johnattan E. Date of Service: 01/11/2019 4:00 PM Medical Record Number: [id number] Patient Account Number: [id number] Date of Birth/Sex: 09/09/1926 (85 y.o. M) Treating RN: Army Melia Primary Care Tascha Casares: Bluford Kaufmann Other Clinician: Referring Nelton Amsden: Bluford Kaufmann Treating Lorelie Biermann/Extender: Melburn Hake, HOYT Weeks in Treatment: 8 Allergies Active Allergies NSAIDS (Non-Steroidal Anti-Inflammatory Drug) Vicodin morphine Allergy Notes Electronic Signature(s) Signed: 01/11/2019 4:39:22 PM By: Worthy Keeler [name] Entered By: Worthy Keeler on 01/11/2019 16:39:22 Linton Hall, Brently E. ([id number]) -------------------------------------------------------------------------------- Wound Assessment Details Patient Name: Dunkley, Allard E. Date of Service: 01/11/2019 4:00 PM Medical Record Number: [id number] Patient Account Number: [id number] Date of Birth/Sex: 03/30/1927 (85 y.o. M) Treating RN: Army Melia Primary Care Timberlee Roblero: Bluford Kaufmann Other Clinician: Referring Jasilyn Holderman: Bluford Kaufmann Treating Caylin Nass/Extender: Melburn Hake, HOYT Weeks in Treatment: 8 Wound Status Wound Number: 3 Primary Pressure Ulcer Etiology: Wound Location: Sacrum - Midline Wound Open Wounding Event: Pressure Injury Status: Date Acquired: 05/28/2017 Comorbid Chronic Obstructive Pulmonary Disease Weeks Of Treatment: 8 History: (COPD), Congestive Heart Failure, Clustered Wound: No Hypertension, Osteoarthritis Wound Measurements Length: (cm) 1.4 Width: (cm) 1.8 Depth: (cm) 2 Area: (cm) 1.979 Volume: (cm) 3.958 % Reduction in Area: 70% % Reduction in Volume: 76% Epithelialization: Medium (34-66%) Tunneling: No Undermining: No Wound Description Classification: Category/Stage IV Wound Margin: Thickened Exudate Amount: Small Exudate Type: Purulent Exudate Color: yellow, brown,  green Foul Odor After Cleansing: No Slough/Fibrino Yes Wound Bed Granulation Amount: Large (67-100%) Exposed Structure Granulation Quality: Red Fascia Exposed: No Necrotic Amount: None Present (0%) Fat Layer (Subcutaneous Tissue) Exposed: Yes Tendon Exposed: No Muscle Exposed: No Joint Exposed: No Bone Exposed: No Electronic Signature(s) Signed: 01/11/2019 4:42:05 PM By: Worthy Keeler [name] Signed: 01/12/2019 9:37:06 AM By: Army Melia Entered By: Worthy Keeler on 01/11/2019 16:42:05

## 2019-02-01 ENCOUNTER — Encounter: Payer: Medicare Other | Attending: Physician Assistant | Admitting: Physician Assistant

## 2019-02-01 NOTE — Progress Notes (Addendum)
[name], [name] ([id number]) Visit Report for 02/01/2019 Allergy List Details Patient Name: [name], [name] Date of Service: 02/01/2019 4:00 PM Medical Record Number: [id number] Patient Account Number: [id number] Date of Birth/Sex: 25-Apr-1927 (85 y.o. M) Treating RN: Army Melia Primary Care Lisa Blakeman: Bluford Kaufmann Other Clinician: Referring Dontez Hauss: Bluford Kaufmann Treating Jevon Shells/Extender: STONE III, HOYT Weeks in Treatment: 11 Allergies Active Allergies NSAIDS (Non-Steroidal Anti-Inflammatory Drug) Vicodin morphine Allergy Notes Electronic Signature(s) Signed: 02/01/2019 4:19:00 PM By: Worthy Keeler [name] Entered By: Worthy Keeler on 02/01/2019 16:19:00 Harm, Tudor E. ([id number]) -------------------------------------------------------------------------------- Wound Assessment Details Patient Name: [name], [name] Date of Service: 02/01/2019 4:00 PM Medical Record Number: [id number] Patient Account Number: [id number] Date of Birth/Sex: 13-Jul-1926 (85 y.o. M) Treating RN: Army Melia Primary Care Massie Mees: Bluford Kaufmann Other Clinician: Referring Locklan Canoy: Bluford Kaufmann Treating Kinsey Cowsert/Extender: Melburn Hake, HOYT Weeks in Treatment: 11 Wound Status Wound Number: 3 Primary Pressure Ulcer Etiology: Wound Location: Sacrum - Midline Wound Open Wounding Event: Pressure Injury Status: Date Acquired: 05/28/2017 Comorbid Chronic Obstructive Pulmonary Disease Weeks Of Treatment: 11 History: (COPD), Congestive Heart Failure, Clustered Wound: No Hypertension, Osteoarthritis Wound Measurements Length: (cm) 1.4 Width: (cm) 1.4 Depth: (cm) 1.6 Area: (cm) 1.539 Volume: (cm) 2.463 % Reduction in Area: 76.7% % Reduction in Volume: 85.1% Epithelialization: Medium (34-66%) Tunneling: No Undermining: No Wound Description Classification: Category/Stage IV Wound Margin: Thickened Exudate Amount: Small Exudate Type: Purulent Exudate Color: yellow,  brown, green Foul Odor After Cleansing: No Slough/Fibrino Yes Wound Bed Granulation Amount: Large (67-100%) Exposed Structure Granulation Quality: Red Fascia Exposed: No Necrotic Amount: None Present (0%) Fat Layer (Subcutaneous Tissue) Exposed: Yes Tendon Exposed: No Muscle Exposed: No Joint Exposed: No Bone Exposed: No Electronic Signature(s) Signed: 02/01/2019 4:48:36 PM By: Worthy Keeler [name] Signed: 02/02/2019 8:47:01 AM By: Army Melia Entered By: Worthy Keeler on 02/01/2019 16:48:36

## 2019-02-02 NOTE — Progress Notes (Signed)
[name] [name] [name] ([id number]) Visit Report for 02/01/2019 Chief Complaint Document Details Patient Name: [name] [name] E. Date of Service: 02/01/2019 4:00 PM Medical Record Number: [id number] Patient Account Number: [id number] Date of Birth/Sex: 06/19/1926 (85 y.o. M) Treating RN: Rodell Perna Primary Care Provider: Eleonore Chiquito Other Clinician: Referring Provider: Eleonore Chiquito Treating Provider/Extender: Linwood Dibbles, HOYT Weeks in Treatment: 11 Information Obtained from: Patient Chief Complaint Sacral pressure ulcer Electronic Signature(s) Signed: 02/01/2019 4:19:41 PM By: Lenda Kelp [name] Entered By: Lenda Kelp on 02/01/2019 16:19:41 [name], [name] ([id number]) -------------------------------------------------------------------------------- HPI Details Patient Name: [name] [name] E. Date of Service: 02/01/2019 4:00 PM Medical Record Number: [id number] Patient Account Number: [id number] Date of Birth/Sex: July 30, 1926 (85 y.o. M) Treating RN: Rodell Perna Primary Care Provider: Eleonore Chiquito Other Clinician: Referring Provider: Eleonore Chiquito Treating Provider/Extender: Linwood Dibbles, HOYT Weeks in Treatment: 11 History of Present Illness HPI Description: 04/20/17; this is an 85 year old man who is a bit of an unfortunate story. He was initially admitted to hospital from 9/10 through 9/14 for an elective left total knee replacement secondary to osteoarthritis. On 9/17 a nurse in the hospital noted non-blanchable erythema. The patient was discharged to [facility] skilled facility. An admission note by Dr. Lyn Hollingshead on 01/31/17 noted him unstageable sacral decubitus ulcer. The patient was then admitted to hospital from 9/24 through 10/8 now with a sacral wound felt to be infected. The patient had fever and chills. Required in his surgical IandD. Blood cultures grew Clostridium Ramosium and Bacteroides fragilis. I am not able to see the Bacteroides in care everywhere  today. Nevertheless the patient was seen by infectious disease and recommended for 6 weeks of ceftriaxone and Flagyl which should be completed. An x-ray in the hospital on 9/24 showed peroneal air which may be secondary to a decubitus ulcer no evidence for acute osseous or bone destructive change. An x-ray in the facility on 11/29 showed findings consistent with acute osteomyelitis of the distal coccyx.. Given the history here I suspect that this is probably not all acute and could have resulted at the time of infection the patient has a history of chronic atrial fibrillation on Xarelto, congestive heart failure with an EF of 40%, hypertension, BPH and osteoarthritis. He has been referred here for consideration of biopsy and culture of exposed coccyx It is difficult to follow the course of this patient's wound. Certainly he could have had underlying osteo in the hospital that wasn't well identified by plane xray and could have had more resistant organisms that were identified. I think we should wait for the results of the bone bx and cx today before initiating any antibiotics. Readmission: 05/23/18 patient presents today for reevaluation here in our clinic although it has been quite some time since he was last here. He is actually a patient of mine which I was seeing at [facility] nursing facility. Subsequently he has a significant stage for pressure ulcer to the gluteal region which coupled with his muscle weakness is causing a significant issue at this point as far as healing is concerned. It appears that this patient likely has an infection as well in the wound bed. He actually arrived today with no dressing in place and there was actually evidence of fecal material in the wound bed. His daughter was present for the evaluation today as well as a CNA from the facility. The patient has had this wound since I first began seeing him on October 2018. I last saw him for evaluation on January 05, 2018.  This was that [place] when I was still going to the nursing facilities. He said no on-site wound care from a provider other than that the facility since that time. He has not been on any recent antibiotics for the wound. The wound was significantly smaller when I last saw him measuring 3.5 x 2.5 x 3. Today this is much larger and deeper. There's also a significant amount of necrotic material which was not present when I last saw him. 05/30/18 on evaluation today patient appears to be doing excellent in regard to his sacral wound especially as compared to my last evaluation with him. He has been tolerating the dressing changes without complication and though there is some Slough noted there is really no significant bone noted exposed in the wound bed other than a small piece that is working itself out within some granulation tissue. Overall I feel like he's made excellent progress this wound appears to be night and day compared to what it looks like last week. 06/13/18 on evaluation today patient's sacral wound though large appears to be doing excellent. There's no sign of pressure injury and in general the tissue quality the sacral region is excellent with great granulation there's no evidence of infection whatsoever. There is no odor. He also states that his pain is down from a 9-10 out of 10 two 2/10 that most but he did not even really react to me cleaning the wound today. 06/27/18 on evaluation today patient actually appears to be doing rather well in regard to his sacral wound. The overall appearance is improved and I'm very pleased in this regard. With that being said he continues to have discomfort and his daughter whom I did speak with today over the phone as well still has not had a chance to talk to the director of nursing [name] [name] ([phone]) regarding the Wound VAC because he and many of the staff have been out on sick leave due to the flu. 07/11/18 on evaluation today patient  actually appears to be doing excellent in regard to his sacral words. He's been tolerating the dressing changes without complication overall I'm extremely pleased with how things appear today. He tells me that was her there would be a nurse practitioner coming to the facility that could see him on site. Again I explained that that is definitely something that is in the works although I'm not sure exactly which days she will be coming to this facility. Obviously depending on when and if we get this up and running that can definitely be a possibility for him to have first see him there and not have to come out to our office for evaluation and treatment. He would definitely like to consider this has with his daughter she states this was much easier on him. 11/13/18 on evaluation today patient was seen for a telehealth visit due to the Covid-19 Virus national emergency. Subsequently he is and is still nursing facility and his nurse at the facility who performs the wound care on a daily basis, Danae Chen, is present during the evaluation. She did measure the wound dimensions for [place] as well today which was excellent and very helpful. Subsequently it appears that in the past week he has noted or rather his wound nurse has noted that the appearance of the wound bed was much darker and did not appear to be nearly as healthy as what it was in the past. Therefore she actually wanted to set up this visit  in order for me to try to take a look. Unfortunately the video quality is not excellent and I really was not able to tell to what extent the wound may be different although it's also been several months since I last saw his wound he was being cared for by I Rosita KeaKeisha Knight in the facility he was our nurse practitioner for skilled nursing facilities until just recently. Currently they have been using the Vashe which was previously instituted by myself when I last saw the patient in the clinic. 12/04/18 on evaluation  today patient is seen by way of telehealth visit at the nursing facility due to the fact that he has been quarantined by the facility simply in response to attempting to prevent any spread of Covid-19 Virus. If he comes out to the clinic he actually has this [facility] and they really don't have the staff to do this. For that reason the trafficking patient's from going out if at all possible. Nonetheless the good news is he seems to be doing better according to what his nurse is telling me today. Measurement wise everything seemed to be significantly improved which was great news. Fortunately there's no evidence of active infection at this time. Patient states she's not having any significant pain which is excellent news he seems to be in good spirits. 12/21/18 upon evaluation today patient actually appears to be doing well with regard to his wound in the sacral region. He is seen by way of a telehealth visit with the nurse at his facility, [facility]. With that being said his wound in particular according to the nurse appears to be doing well from the standpoint of the overall appearance of the wound bed. With that being said the measurements are also smaller today that she obtains which is excellent news. He doesn't seem to be having any pain and he tells me that he is attempting to offload more at this time as well which is good news that's definitely gonna be beneficial for him. No fevers, chills, nausea, or vomiting noted at this time. 01/11/2019 on evaluation today patient actually appears to be doing well with regard to sacral ulcer. He has been tolerating the dressing changes without complication. Fortunately there is no sign of active infection at this time. He has been tolerating offloading as well. It does sound like he is actually done a little bit more of this currently which is at least good news. Overall I am very happy in that regard. 02/01/2019 patient is seen  today for a telehealth visit as he still resides in a skilled nursing facility and again they are not transporting patients out for wound care at this point due to risk of COVID. With that being said he seems to be doing well compared to last time I saw him the wound bed is showing signs of improvement according to what his nurse tells me today. This is excellent news. The wound is not measuring significantly smaller but is not as deep as it was previous. Overall this is great news. There is also no signs of infection at this time. Electronic Signature(s) Signed: 02/01/2019 4:48:47 PM By: Lenda KelpStone III, Hoyt [name] Entered By: Lenda KelpStone III, Hoyt on 02/01/2019 16:48:46 [name] [name] ChristmasEVERETT E. ([id number]) -------------------------------------------------------------------------------- Physical Exam Details Patient Name: Warmack, Kagan E. Date of Service: 02/01/2019 4:00 PM Medical Record Number: [id number] Patient Account Number: [id number] Date of Birth/Sex: 1926/12/26 (85 y.o. M) Treating RN: Rodell PernaScott, Dajea Primary Care Provider: Eleonore ChiquitoKWIATKOWSKI, PETER  Other Clinician: Referring Provider: Eleonore Chiquito Treating Provider/Extender: STONE III, HOYT Weeks in Treatment: 11 Constitutional Obese and well-hydrated in no acute distress. Respiratory normal breathing without difficulty. Psychiatric this patient is able to make decisions and demonstrates good insight into disease process. Alert and Oriented x 3. pleasant and cooperative. Notes Patient's wound bed currently showed signs of good granulation at this time based on what I saw on the video assessment today. He seems to be doing well is not having any pain and there does not appear to be any obvious signs of pressure injury which is also good news. Overall he is smiling and pleasant over the video chat. Electronic Signature(s) Signed: 02/01/2019 4:49:16 PM By: Lenda Kelp [name] Entered By: Lenda Kelp on 02/01/2019 16:49:16 [name] [name] Christmas  ([id number]) -------------------------------------------------------------------------------- Physician Orders Details Patient Name: Glatfelter, Arsen E. Date of Service: 02/01/2019 4:00 PM Medical Record Number: [id number] Patient Account Number: [id number] Date of Birth/Sex: [birth year] (85 y.o. M) Treating RN: Rodell Perna Primary Care Provider: Eleonore Chiquito Other Clinician: Referring Provider: Eleonore Chiquito Treating Provider/Extender: Linwood Dibbles, HOYT Weeks in Treatment: 59 Verbal / Phone Orders: No Diagnosis Coding ICD-10 Coding Code Description L89.154 Pressure ulcer of sacral region, stage 4 M62.81 Muscle weakness (generalized) Wound Cleansing Wound #3 Midline Sacrum o Clean wound with Normal Saline. - Or house stock wound cleanser Primary Wound Dressing Wound #3 Midline Sacrum o Other: - Vashe soaked gauze packed into the wound Secondary Dressing Wound #3 Midline Sacrum o ABD pad - Secure with tape Dressing Change Frequency Wound #3 Midline Sacrum o Change dressing every day. Follow-up Appointments Wound #3 Midline Sacrum o Return Appointment in 2 weeks. Off-Loading o Roho cushion for wheelchair o Mattress - Alternating air mattress o Turn and reposition every 2 hours Additional Orders / Instructions o Vitamin A; Vitamin C, Zinc o Increase protein intake. Electronic Signature(s) Signed: 02/01/2019 4:49:36 PM By: Lenda Kelp [name] Entered By: Lenda Kelp on 02/01/2019 16:49:36 [name] [name] Christmas ([id number]) -------------------------------------------------------------------------------- Problem List Details Patient Name: Troop, Murvin E. Date of Service: 02/01/2019 4:00 PM Medical Record Number: [id number] Patient Account Number: [id number] Date of Birth/Sex: [birth year] (85 y.o. M) Treating RN: Rodell Perna Primary Care Provider: Eleonore Chiquito Other Clinician: Referring Provider: Eleonore Chiquito Treating Provider/Extender:  Linwood Dibbles, HOYT Weeks in Treatment: 11 Active Problems ICD-10 Evaluated Encounter Code Description Active Date Today Diagnosis L89.154 Pressure ulcer of sacral region, stage 4 11/14/2018 No Yes M62.81 Muscle weakness (generalized) 11/14/2018 No Yes Inactive Problems Resolved Problems Electronic Signature(s) Signed: 02/01/2019 4:19:37 PM By: Lenda Kelp [name] Entered By: Lenda Kelp on 02/01/2019 16:19:36 Kasal, Heyden E. ([id number]) -------------------------------------------------------------------------------- Progress Note Details Patient Name: [name] [name] E. Date of Service: 02/01/2019 4:00 PM Medical Record Number: [id number] Patient Account Number: [id number] Date of Birth/Sex: Feb 19, 1927 (85 y.o. M) Treating RN: Rodell Perna Primary Care Provider: Eleonore Chiquito Other Clinician: Referring Provider: Eleonore Chiquito Treating Provider/Extender: Linwood Dibbles, HOYT Weeks in Treatment: 11 Subjective Chief Complaint Information obtained from Patient Sacral pressure ulcer History of Present Illness (HPI) 04/20/17; this is an 85 year old man who is a bit of an unfortunate story. He was initially admitted to hospital from 9/10 through 9/14 for an elective left total knee replacement secondary to osteoarthritis. On 9/17 a nurse in the hospital noted non- blanchable erythema. The patient was discharged to [facility] skilled facility. An admission note by Dr. Lyn Hollingshead on 01/31/17 noted him unstageable sacral decubitus ulcer. The patient was then admitted to hospital from 9/24 through  10/8 now with a sacral wound felt to be infected. The patient had fever and chills. Required in his surgical IandD. Blood cultures grew Clostridium Ramosium and Bacteroides fragilis. I am not able to see the Bacteroides in care everywhere today. Nevertheless the patient was seen by infectious disease and recommended for 6 weeks of ceftriaxone and Flagyl which should be completed. An x-ray in the  hospital on 9/24 showed peroneal air which may be secondary to a decubitus ulcer no evidence for acute osseous or bone destructive change. An x-ray in the facility on 11/29 showed findings consistent with acute osteomyelitis of the distal coccyx.. Given the history here I suspect that this is probably not all acute and could have resulted at the time of infection the patient has a history of chronic atrial fibrillation on Xarelto, congestive heart failure with an EF of 40%, hypertension, BPH and osteoarthritis. He has been referred here for consideration of biopsy and culture of exposed coccyx It is difficult to follow the course of this patient's wound. Certainly he could have had underlying osteo in the hospital that wasn't well identified by plane xray and could have had more resistant organisms that were identified. I think we should wait for the results of the bone bx and cx today before initiating any antibiotics. Readmission: 05/23/18 patient presents today for reevaluation here in our clinic although it has been quite some time since he was last here. He is actually a patient of mine which I was seeing at [place] facility. Subsequently he has a significant stage for pressure ulcer to the gluteal region which coupled with his muscle weakness is causing a significant issue at this point as far as healing is concerned. It appears that this patient likely has an infection as well in the wound bed. He actually arrived today with no dressing in place and there was actually evidence of fecal material in the wound bed. His daughter was present for the evaluation today as well as a CNA from the facility. The patient has had this wound since I first began seeing him on October 2018. I last saw him for evaluation on January 05, 2018. This was that [place] when I was still going to the nursing facilities. He said no on-site wound care from a provider other than that the facility since that  time. He has not been on any recent antibiotics for the wound. The wound was significantly smaller when I last saw him measuring 3.5 x 2.5 x 3. Today this is much larger and deeper. There's also a significant amount of necrotic material which was not present when I last saw him. 05/30/18 on evaluation today patient appears to be doing excellent in regard to his sacral wound especially as compared to my last evaluation with him. He has been tolerating the dressing changes without complication and though there is some Slough noted there is really no significant bone noted exposed in the wound bed other than a small piece that is working itself out within some granulation tissue. Overall I feel like he's made excellent progress this wound appears to be night and day compared to what it looks like last week. 06/13/18 on evaluation today patient's sacral wound though large appears to be doing excellent. There's no sign of pressure injury and in general the tissue quality the sacral region is excellent with great granulation there's no evidence of infection [name] [name] E. ([id number]) whatsoever. There is no odor. He also states that his pain is  down from a 9-10 out of 10 two 2/10 that most but he did not even really react to me cleaning the wound today. 06/27/18 on evaluation today patient actually appears to be doing rather well in regard to his sacral wound. The overall appearance is improved and I'm very pleased in this regard. With that being said he continues to have discomfort and his daughter whom I did speak with today over the phone as well still has not had a chance to talk to the director of nursing regarding the Wound V[facility] because he and many of the staff have been out on sick leave due to the flu. 07/11/18 on evaluation today patient actually appears to be doing excellent in regard to his sacral words. He's been tolerating the dressing changes without complication overall I'm extremely pleased  with how things appear today. He tells me that was her there would be a nurse practitioner coming to the facility that could see him on site. Again I explained that that is definitely something that is in the works although I'm not sure exactly which days she will be coming to this facility. Obviously depending on when and if we get this up and running that can definitely be a possibility for him to have first see him there and not have to come out to our office for evaluation and treatment. He would definitely like to consider this has with his daughter she states this was much easier on him. 11/13/18 on evaluation today patient was seen for a telehealth visit due to the Covid-19 Virus national emergency. Subsequently he is and is still nursing facility and his nurse at the facility who performs the wound care on a daily basis, Alcario Drought, is present during the evaluation. She did measure the wound dimensions for [place] as well today which was excellent and very helpful. Subsequently it appears that in the past week he has noted or rather his wound nurse has noted that the appearance of the wound bed was much darker and did not appear to be nearly as healthy as what it was in the past. Therefore she actually wanted to set up this visit in order for me to try to take a look. Unfortunately the video quality is not excellent and I really was not able to tell to what extent the wound may be different although it's also been several months since I last saw his wound he was being cared for by I Rosita Kea in the facility he was our nurse practitioner for skilled nursing facilities until just recently. Currently they have been using the Vashe which was previously instituted by myself when I last saw the patient in the clinic. 12/04/18 on evaluation today patient is seen by way of telehealth visit at the nursing facility due to the fact that he has been quarantined by the facility simply in response to attempting  to prevent any spread of Covid-19 Virus. If he comes out to the clinic he actually has this been two weeks in the [facility] and they really don't have the staff to do this. For that reason the trafficking patient's from going out if at all possible. Nonetheless the good news is he seems to be doing better according to what his nurse is telling me today. Measurement wise everything seemed to be significantly improved which was great news. Fortunately there's no evidence of active infection at this time. Patient states she's not having any significant pain which is excellent news he seems to  be in good spirits. 12/21/18 upon evaluation today patient actually appears to be doing well with regard to his wound in the sacral region. He is seen by way of a telehealth visit with the nurse at his facility, [facility]. With that being said his wound in particular according to the nurse appears to be doing well from the standpoint of the overall appearance of the wound bed. With that being said the measurements are also smaller today that she obtains which is excellent news. He doesn't seem to be having any pain and he tells me that he is attempting to offload more at this time as well which is good news that's definitely gonna be beneficial for him. No fevers, chills, nausea, or vomiting noted at this time. 01/11/2019 on evaluation today patient actually appears to be doing well with regard to sacral ulcer. He has been tolerating the dressing changes without complication. Fortunately there is no sign of active infection at this time. He has been tolerating offloading as well. It does sound like he is actually done a little bit more of this currently which is at least good news. Overall I am very happy in that regard. 02/01/2019 patient is seen today for a telehealth visit as he still resides in a skilled nursing facility and again they are not transporting patients out for wound care at this point due to  risk of COVID. With that being said he seems to be doing well compared to last time I saw him the wound bed is showing signs of improvement according to what his nurse tells me today. This is excellent news. The wound is not measuring significantly smaller but is not as deep as it was previous. Overall this is great news. There is also no signs of infection at this time. Patient History Information obtained from Patient. Allergies NSAIDS (Non-Steroidal Anti-Inflammatory Drug), Vicodin, morphine [name] [name] E. ([id number]) Family History Heart Disease - Mother,Father, Hypertension - Mother,Father, No family history of Cancer, Diabetes, Hereditary Spherocytosis, Kidney Disease, Lung Disease, Seizures, Stroke, Thyroid Problems, Tuberculosis. Social History Former smoker - quit in 1996, Marital Status - Widowed, Alcohol Use - Never, Drug Use - No History, Caffeine Use - Daily. Medical History Eyes Denies history of Cataracts, Glaucoma, Optic Neuritis Hematologic/Lymphatic Denies history of Anemia, Hemophilia, Human Immunodeficiency Virus, Lymphedema, Sickle Cell Disease Respiratory Patient has history of Chronic Obstructive Pulmonary Disease (COPD) Cardiovascular Patient has history of Congestive Heart Failure, Hypertension Genitourinary Denies history of End Stage Renal Disease Musculoskeletal Patient has history of Osteoarthritis Denies history of Osteomyelitis Oncologic Denies history of Received Chemotherapy, Received Radiation Medical And Surgical History Notes Eyes macular degeneration Respiratory continuous O2 Review of Systems (ROS) Constitutional Symptoms (General Health) Denies complaints or symptoms of Fatigue, Fever, Chills, Marked Weight Change. Respiratory Denies complaints or symptoms of Chronic or frequent coughs, Shortness of Breath. Cardiovascular Denies complaints or symptoms of Chest pain, LE edema. Psychiatric Denies complaints or symptoms of Anxiety,  Claustrophobia. Objective Constitutional Obese and well-hydrated in no acute distress. Respiratory normal breathing without difficulty. Psychiatric [name] [name] E. ([id number]) this patient is able to make decisions and demonstrates good insight into disease process. Alert and Oriented x 3. pleasant and cooperative. General Notes: Patient's wound bed currently showed signs of good granulation at this time based on what I saw on the video assessment today. He seems to be doing well is not having any pain and there does not appear to be any obvious signs of pressure injury which is also good news.  Overall he is smiling and pleasant over the video chat. Integumentary (Hair, Skin) Wound #3 status is Open. Original cause of wound was Pressure Injury. The wound is located on the Midline Sacrum. The wound measures 1.4cm length x 1.4cm width x 1.6cm depth; 1.539cm^2 area and 2.463cm^3 volume. There is Fat Layer (Subcutaneous Tissue) Exposed exposed. There is no tunneling or undermining noted. There is a small amount of purulent drainage noted. The wound margin is thickened. There is large (67-100%) red granulation within the wound bed. There is no necrotic tissue within the wound bed. Assessment Active Problems ICD-10 Pressure ulcer of sacral region, stage 4 Muscle weakness (generalized) Plan Wound Cleansing: Wound #3 Midline Sacrum: Clean wound with Normal Saline. - Or house stock wound cleanser Primary Wound Dressing: Wound #3 Midline Sacrum: Other: - Vashe soaked gauze packed into the wound Secondary Dressing: Wound #3 Midline Sacrum: ABD pad - Secure with tape Dressing Change Frequency: Wound #3 Midline Sacrum: Change dressing every day. Follow-up Appointments: Wound #3 Midline Sacrum: Return Appointment in 2 weeks. Off-Loading: Roho cushion for wheelchair Mattress - Alternating air mattress Turn and reposition every 2 hours Additional Orders / Instructions: Vitamin A;  Vitamin C, Zinc Increase protein intake. [name] [name] E. ([id number]) 1. At this time I did discuss with the nurse at the facility the possibility of initiating a wound VAC. The concern we have here that I both was worried about as well as her concern is that the patient may not do so well with this secondary to frequent diarrhea. Obviously the biggest issue is maintaining a seal which we had issues with previous when I was taking care of him as well. That is always been a big concern we were utilizing the wound VAC. The good news is there does not appear to be any signs of active infection at this time and overall I feel like he is doing well with the current treatment. 2. Would recommend that he continue with appropriate offloading as we have mentioned and recommended previous. 3. I am also going to suggest at this time that we have him use the Roho cushion in his chair as well I still think this is very beneficial for him. We will see patient back for reevaluation in 2 weeks here in the clinic. If anything worsens or changes patient will contact our office for additional recommendations. This will be a telehealth visit as well at that time. Electronic Signature(s) Signed: 02/01/2019 4:53:25 PM By: Lenda Kelp [name] Entered By: Lenda Kelp on 02/01/2019 16:53:24 [name] [name] Christmas ([id number]) -------------------------------------------------------------------------------- ROS/PFSH Details Patient Name: [name] [name] E. Date of Service: 02/01/2019 4:00 PM Medical Record Number: [id number] Patient Account Number: [id number] Date of Birth/Sex: Mar 03, 1927 (85 y.o. M) Treating RN: Rodell Perna Primary Care Provider: Eleonore Chiquito Other Clinician: Referring Provider: Eleonore Chiquito Treating Provider/Extender: Linwood Dibbles, HOYT Weeks in Treatment: 11 Information Obtained From Patient Constitutional Symptoms (General Health) Complaints and Symptoms: Negative for: Fatigue; Fever;  Chills; Marked Weight Change Respiratory Complaints and Symptoms: Negative for: Chronic or frequent coughs; Shortness of Breath Medical History: Positive for: Chronic Obstructive Pulmonary Disease (COPD) Past Medical History Notes: continuous O2 Cardiovascular Complaints and Symptoms: Negative for: Chest pain; LE edema Medical History: Positive for: Congestive Heart Failure; Hypertension Psychiatric Complaints and Symptoms: Negative for: Anxiety; Claustrophobia Eyes Medical History: Negative for: Cataracts; Glaucoma; Optic Neuritis Past Medical History Notes: macular degeneration Hematologic/Lymphatic Medical History: Negative for: Anemia; Hemophilia; Human Immunodeficiency Virus; Lymphedema; Sickle Cell Disease Genitourinary Medical History: Negative for:  End Stage Renal Disease Musculoskeletal Kight, Amaar E. ([id number]) Medical History: Positive for: Osteoarthritis Negative for: Osteomyelitis Oncologic Medical History: Negative for: Received Chemotherapy; Received Radiation Immunizations Pneumococcal Vaccine: Received Pneumococcal Vaccination: Yes Immunization Notes: up to date Implantable Devices None Family and Social History Cancer: No; Diabetes: No; Heart Disease: Yes - Mother,Father; Hereditary Spherocytosis: No; Hypertension: Yes - Mother,Father; Kidney Disease: No; Lung Disease: No; Seizures: No; Stroke: No; Thyroid Problems: No; Tuberculosis: No; Former smoker - quit in 1996; Marital Status - Widowed; Alcohol Use: Never; Drug Use: No History; Caffeine Use: Daily; Financial Concerns: No; Food, Clothing or Shelter Needs: No; Support System Lacking: No; Transportation Concerns: No Physician Affirmation I have reviewed and agree with the above information. Electronic Signature(s) Signed: 02/01/2019 5:23:22 PM By: Lenda Kelp [name] Signed: 02/02/2019 8:47:01 AM By: Rodell Perna Entered By: Lenda Kelp on 02/01/2019 16:19:15 Krogstad, Greydon E.  ([id number]) -------------------------------------------------------------------------------- SuperBill Details Patient Name: Roughton, Barkley E. Date of Service: 02/01/2019 Medical Record Number: [id number] Patient Account Number: [id number] Date of Birth/Sex: 13-Dec-1926 (85 y.o. M) Treating RN: Rodell Perna Primary Care Provider: Eleonore Chiquito Other Clinician: Referring Provider: Eleonore Chiquito Treating Provider/Extender: Linwood Dibbles, HOYT Weeks in Treatment: 11 Diagnosis Coding ICD-10 Codes Code Description L89.154 Pressure ulcer of sacral region, stage 4 M62.81 Muscle weakness (generalized) Physician Procedures CPT4 Code: 0865784 Description: 99213 - WC PHYS LEVEL 3 - EST PT ICD-10 Diagnosis Description L89.154 Pressure ulcer of sacral region, stage 4 M62.81 Muscle weakness (generalized) Modifier: Quantity: 1 Electronic Signature(s) Signed: 02/01/2019 4:53:50 PM By: Lenda Kelp [name] Entered By: Lenda Kelp on 02/01/2019 16:53:50

## 2019-02-15 ENCOUNTER — Ambulatory Visit: Payer: Medicare Other | Admitting: Physician Assistant

## 2019-02-22 ENCOUNTER — Encounter: Payer: Medicare Other | Attending: Physician Assistant | Admitting: Physician Assistant

## 2019-03-05 NOTE — Progress Notes (Signed)
Uzzle, [name] ([id number]) Visit Report for 02/22/2019 Allergy List Details Patient Name: [name], [name] Date of Service: 02/22/2019 4:00 PM Medical Record Number: [id number] Patient Account Number: [id number] Date of Birth/Sex: [birth year] (85 y.o. M) Treating RN: Army Melia Primary Care Edelmira Gallogly: Bluford Kaufmann Other Clinician: Referring Yakir Wenke: Bluford Kaufmann Treating Shaylene Paganelli/Extender: STONE III, HOYT Weeks in Treatment: 14 Allergies Active Allergies NSAIDS (Non-Steroidal Anti-Inflammatory Drug) Vicodin morphine Allergy Notes Electronic Signature(s) Signed: 02/22/2019 5:11:53 PM By: Worthy Keeler [name] Entered By: Worthy Keeler on 02/22/2019 16:12:23 Ashurst, Jamari E. ([id number]) -------------------------------------------------------------------------------- Wound Assessment Details Patient Name: [name], [name] Date of Service: 02/22/2019 4:00 PM Medical Record Number: [id number] Patient Account Number: [id number] Date of Birth/Sex: 03-04-1927 (85 y.o. M) Treating RN: Army Melia Primary Care Madgie Dhaliwal: Bluford Kaufmann Other Clinician: Referring Mance Vallejo: Bluford Kaufmann Treating Harutyun Monteverde/Extender: Melburn Hake, HOYT Weeks in Treatment: 14 Wound Status Wound Number: 3 Primary Pressure Ulcer Etiology: Wound Location: Sacrum - Midline Wound Open Wounding Event: Pressure Injury Status: Date Acquired: 05/28/2017 Comorbid Chronic Obstructive Pulmonary Disease Weeks Of Treatment: 14 History: (COPD), Congestive Heart Failure, Clustered Wound: No Hypertension, Osteoarthritis Wound Measurements Length: (cm) 1.6 Width: (cm) 1.4 Depth: (cm) 1.2 Area: (cm) 1.759 Volume: (cm) 2.111 % Reduction in Area: 73.3% % Reduction in Volume: 87.2% Epithelialization: Medium (34-66%) Wound Description Classification: Category/Stage IV Wound Margin: Distinct, outline attached Exudate Amount: Small Exudate Type: Purulent Exudate Color: yellow, brown, green Foul  Odor After Cleansing: No Slough/Fibrino Yes Wound Bed Granulation Amount: Large (67-100%) Exposed Structure Granulation Quality: Red Fascia Exposed: No Necrotic Amount: None Present (0%) Fat Layer (Subcutaneous Tissue) Exposed: Yes Tendon Exposed: No Muscle Exposed: No Joint Exposed: No Bone Exposed: No Electronic Signature(s) Signed: 02/22/2019 5:11:53 PM By: Worthy Keeler [name] Signed: 03/05/2019 8:11:06 AM By: Army Melia Entered By: Worthy Keeler on 02/22/2019 16:20:20

## 2019-03-05 NOTE — Progress Notes (Signed)
Awadallah, [name] ChristmasVERETT E. ([id number]) Visit Report for 02/22/2019 Chief Complaint Document Details Patient Name: [name], [name] Date of Service: 02/22/2019 4:00 PM Medical Record Number: [id number] Patient Account Number: [id number] Date of Birth/Sex: 1926/12/09 (85 y.o. M) Treating RN: Rodell PernaScott, Dajea Primary Care Provider: Eleonore ChiquitoKWIATKOWSKI, PETER Other Clinician: Referring Provider: Eleonore ChiquitoKWIATKOWSKI, PETER Treating Provider/Extender: Linwood DibblesSTONE III, Rosselyn Martha Weeks in Treatment: 14 Information Obtained from: Patient Chief Complaint Sacral pressure ulcer Electronic Signature(s) Signed: 02/22/2019 5:11:53 PM By: Lenda KelpStone III, Patric Buckhalter [name] Entered By: Lenda KelpStone III, Tahisha Hakim on 02/22/2019 16:16:38 Saldierna, Mike E. ([id number]) -------------------------------------------------------------------------------- HPI Details Patient Name: [name], [name] Date of Service: 02/22/2019 4:00 PM Medical Record Number: [id number] Patient Account Number: [id number] Date of Birth/Sex: 1926/12/09 (85 y.o. M) Treating RN: Rodell PernaScott, Dajea Primary Care Provider: Eleonore ChiquitoKWIATKOWSKI, PETER Other Clinician: Referring Provider: Eleonore ChiquitoKWIATKOWSKI, PETER Treating Provider/Extender: Linwood DibblesSTONE III, Ketsia Linebaugh Weeks in Treatment: 14 History of Present Illness HPI Description: 04/20/17; this is an 85 year old man who is a bit of an unfortunate story. He was initially admitted to hospital from 9/10 through 9/14 for an elective left total knee replacement secondary to osteoarthritis. On 9/17 a nurse in the hospital noted non-blanchable erythema. The patient was discharged to [facility] skilled facility. An admission note by Dr. Lyn HollingsheadAlexander on 01/31/17 noted him unstageable sacral decubitus ulcer. The patient was then admitted to hospital from 9/24 through 10/8 now with a sacral wound felt to be infected. The patient had fever and chills. Required in his surgical IandD. Blood cultures grew Clostridium Ramosium and Bacteroides fragilis. I am not able to see the Bacteroides in care everywhere  today. Nevertheless the patient was seen by infectious disease and recommended for 6 weeks of ceftriaxone and Flagyl which should be completed. An x-ray in the hospital on 9/24 showed peroneal air which may be secondary to a decubitus ulcer no evidence for acute osseous or bone destructive change. An x-ray in the facility on 11/29 showed findings consistent with acute osteomyelitis of the distal coccyx.. Given the history here I suspect that this is probably not all acute and could have resulted at the time of infection the patient has a history of chronic atrial fibrillation on Xarelto, congestive heart failure with an EF of 40%, hypertension, BPH and osteoarthritis. He has been referred here for consideration of biopsy and culture of exposed coccyx It is difficult to follow the course of this patient's wound. Certainly he could have had underlying osteo in the hospital that wasn't well identified by plane xray and could have had more resistant organisms that were identified. I think we should wait for the results of the bone bx and cx today before initiating any antibiotics. Readmission: 05/23/18 patient presents today for reevaluation here in our clinic although it has been quite some time since he was last here. He is actually a patient of mine which [facility] Subsequently he has a significant stage for pressure ulcer to the gluteal region which coupled with his muscle weakness is causing a significant issue at this point as far as healing is concerned. It appears that this patient likely has an infection as well in the wound bed. He actually arrived today with no dressing in place and there was actually evidence of fecal material in the wound bed. His daughter was present for the evaluation today as well as a CNA from the facility. The patient has had this wound since I first began seeing him on October 2018. I last saw him for evaluation on January 05, 2018.  This was that [place] when I was still going to the nursing facilities. He said no on-site wound care from a provider other than that the facility since that time. He has not been on any recent antibiotics for the wound. The wound was significantly smaller when I last saw him measuring 3.5 x 2.5 x 3. Today this is much larger and deeper. There's also a significant amount of necrotic material which was not present when I last saw him. 05/30/18 on evaluation today patient appears to be doing excellent in regard to his sacral wound especially as compared to my last evaluation with him. He has been tolerating the dressing changes without complication and though there is some Slough noted there is really no significant bone noted exposed in the wound bed other than a small piece that is working itself out within some granulation tissue. Overall I feel like he's made excellent progress this wound appears to be night and day compared to what it looks like last week. 06/13/18 on evaluation today patient's sacral wound though large appears to be doing excellent. There's no sign of pressure injury and in general the tissue quality the sacral region is excellent with great granulation there's no evidence of infection whatsoever. There is no odor. He also states that his pain is down from a 9-10 out of 10 two 2/10 that most but he did not even really react to me cleaning the wound today. 06/27/18 on evaluation today patient actually appears to be doing rather well in regard to his sacral wound. The overall appearance is improved and I'm very pleased in this regard. With that being said he continues to have discomfort and his daughter whom I did speak with today over the phone as well still has not had a chance to talk to the director of nursing [name], [name]. ([phone]) regarding the Wound [facility] because he and many of the staff have been out on sick leave due to the flu. 07/11/18 on evaluation today patient  actually appears to be doing excellent in regard to his sacral words. He's been tolerating the dressing changes without complication overall I'm extremely pleased with how things appear today. He tells me that was her there would be a nurse practitioner coming to the facility that could see him on site. Again I explained that that is definitely something that is in the works although I'm not sure exactly which days she will be coming to this facility. Obviously depending on when and if we get this up and running that can definitely be a possibility for him to have first see him there and not have to come out to our office for evaluation and treatment. He would definitely like to consider this has with his daughter she states this was much easier on him. 11/13/18 on evaluation today patient was seen for a telehealth visit due to the Covid-19 Virus national emergency. Subsequently he is and is still nursing facility and his nurse at the facility who performs the wound care on a daily basis, Danae Chen, is present during the evaluation. She did measure the wound dimensions for [place] as well today which was excellent and very helpful. Subsequently it appears that in the past week he has noted or rather his wound nurse has noted that the appearance of the wound bed was much darker and did not appear to be nearly as healthy as what it was in the past. Therefore she actually wanted to set up this visit  in order for me to try to take a look. Unfortunately the video quality is not excellent and I really was not able to tell to what extent the wound may be different although it's also been several months since I last saw his wound he was being cared for by I Rosita Kea in the facility he was our nurse practitioner for skilled nursing facilities until just recently. Currently they have been using the Vashe which was previously instituted by myself when I last saw the patient in the clinic. 12/04/18 on evaluation  today patient is seen by way of telehealth visit at the nursing facility due to the fact that he has been quarantined by the facility simply in response to attempting to prevent any spread of Covid-19 Virus. If he comes out to the clinic he actually has this been two weeks in the [facility] and they really don't have the staff to do this. For that reason the trafficking patient's from going out if at all possible. Nonetheless the good news is he seems to be doing better according to what his nurse is telling me today. Measurement wise everything seemed to be significantly improved which was great news. Fortunately there's no evidence of active infection at this time. Patient states she's not having any significant pain which is excellent news he seems to be in good spirits. 12/21/18 upon evaluation today patient actually appears to be doing well with regard to his wound in the sacral region. He is seen by way of a telehealth visit with the nurse at his facility, [place]. With that being said his wound in particular according to the nurse appears to be doing well from the standpoint of the overall appearance of the wound bed. With that being said the measurements are also smaller today that she obtains which is excellent news. He doesn't seem to be having any pain and he tells me that he is attempting to offload more at this time as well which is good news that's definitely gonna be beneficial for him. No fevers, chills, nausea, or vomiting noted at this time. 01/11/2019 on evaluation today patient actually appears to be doing well with regard to sacral ulcer. He has been tolerating the dressing changes without complication. Fortunately there is no sign of active infection at this time. He has been tolerating offloading as well. It does sound like he is actually done a little bit more of this currently which is at least good news. Overall I am very happy in that regard. 02/01/2019 patient is seen  today for a telehealth visit as he still resides in a skilled nursing facility and again they are not transporting patients out for wound care at this point due to risk of COVID. With that being said he seems to be doing well compared to last time I saw him the wound bed is showing signs of improvement according to what his nurse tells me today. This is excellent news. The wound is not measuring significantly smaller but is not as deep as it was previous. Overall this is great news. There is also no signs of infection at this time. 02/22/2019 patient is seen today for a telehealth visit at the nursing facility where he resides due to the COVID-19 national pandemic. Subsequently he seems to be doing well with regard to his sacral wound there is slight maceration the nurse does tell me about but again with the dressing was using that is not necessarily uncommon. With that being said the  wound is measuring about the same externally but significantly smaller as far as the depth is concerned this is excellent news. No fevers, chills, nausea, vomiting, or diarrhea. Electronic Signature(s) Signed: 02/22/2019 5:09:48 PM By: Lenda Kelp [name] Entered By: Lenda Kelp on 02/22/2019 17:09:48 Serafin, [name] ([id number]) -------------------------------------------------------------------------------- Physical Exam Details Patient Name: Kracht, [name] Date of Service: 02/22/2019 4:00 PM Medical Record Number: [id number] Patient Account Number: [id number] Date of Birth/Sex: Oct 22, 1926 (85 y.o. M) Treating RN: Rodell Perna Primary Care Provider: Eleonore Chiquito Other Clinician: Referring Provider: Eleonore Chiquito Treating Provider/Extender: STONE III, Forrester Blando Weeks in Treatment: 14 Constitutional Obese and well-hydrated in no acute distress. Respiratory normal breathing without difficulty. Cardiovascular 1+ pitting edema of the bilateral lower extremities. Musculoskeletal Patient unable to  walk without assistance. no significant deformity or arthritic changes, no loss or range of motion, no clubbing. Psychiatric this patient is able to make decisions and demonstrates good insight into disease process. Alert and Oriented x 3. pleasant and cooperative. Notes Upon inspection today patient's wound bed actually showed signs of as best I can tell good granulation tissue with minimal maceration noted around the immediate edge of the wound. With that being said overall I still think things are going quite well with regard to the overall wound status based on what I am seeing. Electronic Signature(s) Signed: 02/22/2019 5:10:26 PM By: Lenda Kelp [name] Entered By: Lenda Kelp on 02/22/2019 17:10:26 Granados, [name] ([id number]) -------------------------------------------------------------------------------- Physician Orders Details Patient Name: Niznik, Juwuan E. Date of Service: 02/22/2019 4:00 PM Medical Record Number: [id number] Patient Account Number: [id number] Date of Birth/Sex: 07/19/1926 (85 y.o. M) Treating RN: Rodell Perna Primary Care Provider: Eleonore Chiquito Other Clinician: Referring Provider: Eleonore Chiquito Treating Provider/Extender: Linwood Dibbles, Jonte Wollam Weeks in Treatment: 59 Verbal / Phone Orders: No Diagnosis Coding ICD-10 Coding Code Description L89.154 Pressure ulcer of sacral region, stage 4 M62.81 Muscle weakness (generalized) Wound Cleansing Wound #3 Midline Sacrum o Clean wound with Normal Saline. - Or house stock wound cleanser Primary Wound Dressing Wound #3 Midline Sacrum o Other: - Vashe soaked gauze packed into the wound Secondary Dressing Wound #3 Midline Sacrum o ABD pad - Secure with tape Dressing Change Frequency Wound #3 Midline Sacrum o Change dressing every day. Follow-up Appointments Wound #3 Midline Sacrum o Return Appointment in 2 weeks. Off-Loading o Roho cushion for wheelchair o Mattress - Alternating air  mattress o Turn and reposition every 2 hours Additional Orders / Instructions o Vitamin A; Vitamin C, Zinc o Increase protein intake. o Other: - Fax orders to SNF Attn: Dawn [phone] Electronic Signature(s) Signed: 02/22/2019 5:11:53 PM By: Lenda Kelp [name] Entered By: Lenda Kelp on 02/22/2019 16:24:18 Harbour, Roan EMarland Kitchen ([id number]) -------------------------------------------------------------------------------- Problem List Details Patient Name: Lobue, Kypton E. Date of Service: 02/22/2019 4:00 PM Medical Record Number: [id number] Patient Account Number: [id number] Date of Birth/Sex: 01/28/1927 (85 y.o. M) Treating RN: Rodell Perna Primary Care Provider: Eleonore Chiquito Other Clinician: Referring Provider: Eleonore Chiquito Treating Provider/Extender: Linwood Dibbles, Sharlett Lienemann Weeks in Treatment: 14 Active Problems ICD-10 Evaluated Encounter Code Description Active Date Today Diagnosis L89.154 Pressure ulcer of sacral region, stage 4 11/14/2018 No Yes M62.81 Muscle weakness (generalized) 11/14/2018 No Yes Inactive Problems Resolved Problems Electronic Signature(s) Signed: 02/22/2019 5:11:53 PM By: Lenda Kelp [name] Entered By: Lenda Kelp on 02/22/2019 16:16:33 Siedschlag, Brianna E. ([id number]) -------------------------------------------------------------------------------- Progress Note Details Patient Name: Gorney, [name] Date of Service: 02/22/2019 4:00 PM Medical Record Number: [id number] Patient Account Number: [id number]  Date of Birth/Sex: [birth year] (85 y.o. M) Treating RN: Rodell Perna Primary Care Provider: Eleonore Chiquito Other Clinician: Referring Provider: Eleonore Chiquito Treating Provider/Extender: Linwood Dibbles, Marca Gadsby Weeks in Treatment: 14 Subjective Chief Complaint Information obtained from Patient Sacral pressure ulcer History of Present Illness (HPI) 04/20/17; this is an 85 year old man who is a bit of an unfortunate story. He was  initially admitted to hospital from 9/10 through 9/14 for an elective left total knee replacement secondary to osteoarthritis. On 9/17 a nurse in the hospital noted non- blanchable erythema. The patient was discharged to [facility] skilled facility. An admission note by Dr. Lyn Hollingshead on 01/31/17 noted him unstageable sacral decubitus ulcer. The patient was then admitted to hospital from 9/24 through 10/8 now with a sacral wound felt to be infected. The patient had fever and chills. Required in his surgical IandD. Blood cultures grew Clostridium Ramosium and Bacteroides fragilis. I am not able to see the Bacteroides in care everywhere today. Nevertheless the patient was seen by infectious disease and recommended for 6 weeks of ceftriaxone and Flagyl which should be completed. An x-ray in the hospital on 9/24 showed peroneal air which may be secondary to a decubitus ulcer no evidence for acute osseous or bone destructive change. An x-ray in the facility on 11/29 showed findings consistent with acute osteomyelitis of the distal coccyx.. Given the history here I suspect that this is probably not all acute and could have resulted at the time of infection the patient has a history of chronic atrial fibrillation on Xarelto, congestive heart failure with an EF of 40%, hypertension, BPH and osteoarthritis. He has been referred here for consideration of biopsy and culture of exposed coccyx It is difficult to follow the course of this patient's wound. Certainly he could have had underlying osteo in the hospital that wasn't well identified by plane xray and could have had more resistant organisms that were identified. I think we should wait for the results of the bone bx and cx today before initiating any antibiotics. Readmission: 05/23/18 patient presents today for reevaluation here in our clinic although it has been quite some time since he was last here. He is actually a patient of mine which I was seeing at  [facility] nursing facility. Subsequently he has a significant stage for pressure ulcer to the gluteal region which coupled with his muscle weakness is causing a significant issue at this point as far as healing is concerned. It appears that this patient likely has an infection as well in the wound bed. He actually arrived today with no dressing in place and there was actually evidence of fecal material in the wound bed. His daughter was present for the evaluation today as well as a CNA from the facility. The patient has had this wound since I first began seeing him on October 2018. I last saw him for evaluation on January 05, 2018. This was that [place] when I was still going to the nursing facilities. He said no on-site wound care from a provider other than that the facility since that time. He has not been on any recent antibiotics for the wound. The wound was significantly smaller when I last saw him measuring 3.5 x 2.5 x 3. Today this is much larger and deeper. There's also a significant amount of necrotic material which was not present when I last saw him. 05/30/18 on evaluation today patient appears to be doing excellent in regard to his sacral wound especially as compared to my last  evaluation with him. He has been tolerating the dressing changes without complication and though there is some Slough noted there is really no significant bone noted exposed in the wound bed other than a small piece that is working itself out within some granulation tissue. Overall I feel like he's made excellent progress this wound appears to be night and day compared to what it looks like last week. 06/13/18 on evaluation today patient's sacral wound though large appears to be doing excellent. There's no sign of pressure injury and in general the tissue quality the sacral region is excellent with great granulation there's no evidence of infection Saephanh, Jhovanny E. ([id number]) whatsoever. There is no odor. He  also states that his pain is down from a 9-10 out of 10 two 2/10 that most but he did not even really react to me cleaning the wound today. 06/27/18 on evaluation today patient actually appears to be doing rather well in regard to his sacral wound. The overall appearance is improved and I'm very pleased in this regard. With that being said he continues to have discomfort and his daughter whom I did speak with today over the phone as well still has not had a chance to talk to the director of nursing regarding the Wound [facility] because he and many of the staff have been out on sick leave due to the flu. 07/11/18 on evaluation today patient actually appears to be doing excellent in regard to his sacral words. He's been tolerating the dressing changes without complication overall I'm extremely pleased with how things appear today. He tells me that was her there would be a nurse practitioner coming to the facility that could see him on site. Again I explained that that is definitely something that is in the works although I'm not sure exactly which days she will be coming to this facility. Obviously depending on when and if we get this up and running that can definitely be a possibility for him to have first see him there and not have to come out to our office for evaluation and treatment. He would definitely like to consider this has with his daughter she states this was much easier on him. 11/13/18 on evaluation today patient was seen for a telehealth visit due to the Covid-19 Virus national emergency. Subsequently he is and is still nursing facility and his nurse at the facility who performs the wound care on a daily basis, Alcario Drought, is present during the evaluation. She did measure the wound dimensions for [place] as well today which was excellent and very helpful. Subsequently it appears that in the past week he has noted or rather his wound nurse has noted that the appearance of the wound bed was much darker and  did not appear to be nearly as healthy as what it was in the past. Therefore she actually wanted to set up this visit in order for me to try to take a look. Unfortunately the video quality is not excellent and I really was not able to tell to what extent the wound may be different although it's also been several months since I last saw his wound he was being cared for by I Rosita Kea in the facility he was our nurse practitioner for skilled nursing facilities until just recently. Currently they have been using the Vashe which was previously instituted by myself when I last saw the patient in the clinic. 12/04/18 on evaluation today patient is seen by way of telehealth visit at the  nursing facility due to the fact that he has been quarantined by the facility simply in response to attempting to prevent any spread of Covid-19 Virus. If he comes out to the clinic he actually has this been two weeks in the [facility] and they really don't have the staff to do this. For that reason the trafficking patient's from going out if at all possible. Nonetheless the good news is he seems to be doing better according to what his nurse is telling me today. Measurement wise everything seemed to be significantly improved which was great news. Fortunately there's no evidence of active infection at this time. Patient states she's not having any significant pain which is excellent news he seems to be in good spirits. 12/21/18 upon evaluation today patient actually appears to be doing well with regard to his wound in the sacral region. He is seen by way of a telehealth visit with the nurse at his facility, [place]. With that being said his wound in particular according to the nurse appears to be doing well from the standpoint of the overall appearance of the wound bed. With that being said the measurements are also smaller today that she obtains which is excellent news. He doesn't seem to be having any pain and he  tells me that he is attempting to offload more at this time as well which is good news that's definitely gonna be beneficial for him. No fevers, chills, nausea, or vomiting noted at this time. 01/11/2019 on evaluation today patient actually appears to be doing well with regard to sacral ulcer. He has been tolerating the dressing changes without complication. Fortunately there is no sign of active infection at this time. He has been tolerating offloading as well. It does sound like he is actually done a little bit more of this currently which is at least good news. Overall I am very happy in that regard. 02/01/2019 patient is seen today for a telehealth visit as he still resides in a skilled nursing facility and again they are not transporting patients out for wound care at this point due to risk of COVID. With that being said he seems to be doing well compared to last time I saw him the wound bed is showing signs of improvement according to what his nurse tells me today. This is excellent news. The wound is not measuring significantly smaller but is not as deep as it was previous. Overall this is great news. There is also no signs of infection at this time. 02/22/2019 patient is seen today for a telehealth visit at the nursing facility where he resides due to the COVID-19 national pandemic. Subsequently he seems to be doing well with regard to his sacral wound there is slight maceration the nurse does tell me about but again with the dressing was using that is not necessarily uncommon. With that being said the wound is measuring about the same externally but significantly smaller as far as the depth is concerned this is excellent news. No fevers, chills, nausea, vomiting, or diarrhea. Edmondson, Daquavion E. ([id number]) Patient History Information obtained from Patient. Allergies NSAIDS (Non-Steroidal Anti-Inflammatory Drug), Vicodin, morphine Family History Heart Disease - Mother,Father, Hypertension -  Mother,Father, No family history of Cancer, Diabetes, Hereditary Spherocytosis, Kidney Disease, Lung Disease, Seizures, Stroke, Thyroid Problems, Tuberculosis. Social History Former smoker - quit in 1996, Marital Status - Widowed, Alcohol Use - Never, Drug Use - No History, Caffeine Use - Daily. Medical History Eyes Denies history of Cataracts, Glaucoma, Optic  Neuritis Hematologic/Lymphatic Denies history of Anemia, Hemophilia, Human Immunodeficiency Virus, Lymphedema, Sickle Cell Disease Respiratory Patient has history of Chronic Obstructive Pulmonary Disease (COPD) Cardiovascular Patient has history of Congestive Heart Failure, Hypertension Genitourinary Denies history of End Stage Renal Disease Musculoskeletal Patient has history of Osteoarthritis Denies history of Osteomyelitis Oncologic Denies history of Received Chemotherapy, Received Radiation Medical And Surgical History Notes Eyes macular degeneration Respiratory continuous O2 Review of Systems (ROS) Constitutional Symptoms (General Health) Denies complaints or symptoms of Fatigue, Fever, Chills, Marked Weight Change. Respiratory Denies complaints or symptoms of Chronic or frequent coughs, Shortness of Breath. Cardiovascular Denies complaints or symptoms of Chest pain, LE edema. Psychiatric Denies complaints or symptoms of Anxiety, Claustrophobia. Objective Constitutional Rutan, Kiyoto E. ([id number]) Obese and well-hydrated in no acute distress. Respiratory normal breathing without difficulty. Cardiovascular 1+ pitting edema of the bilateral lower extremities. Musculoskeletal Patient unable to walk without assistance. no significant deformity or arthritic changes, no loss or range of motion, no clubbing. Psychiatric this patient is able to make decisions and demonstrates good insight into disease process. Alert and Oriented x 3. pleasant and cooperative. General Notes: Upon inspection today patient's wound  bed actually showed signs of as best I can tell good granulation tissue with minimal maceration noted around the immediate edge of the wound. With that being said overall I still think things are going quite well with regard to the overall wound status based on what I am seeing. Integumentary (Hair, Skin) Wound #3 status is Open. Original cause of wound was Pressure Injury. The wound is located on the Midline Sacrum. The wound measures 1.6cm length x 1.4cm width x 1.2cm depth; 1.759cm^2 area and 2.111cm^3 volume. There is Fat Layer (Subcutaneous Tissue) Exposed exposed. There is a small amount of purulent drainage noted. The wound margin is distinct with the outline attached to the wound base. There is large (67-100%) red granulation within the wound bed. There is no necrotic tissue within the wound bed. Assessment Active Problems ICD-10 Pressure ulcer of sacral region, stage 4 Muscle weakness (generalized) Plan Wound Cleansing: Wound #3 Midline Sacrum: Clean wound with Normal Saline. - Or house stock wound cleanser Primary Wound Dressing: Wound #3 Midline Sacrum: Other: - Vashe soaked gauze packed into the wound Secondary Dressing: Wound #3 Midline Sacrum: ABD pad - Secure with tape Dressing Change Frequency: Wound #3 Midline Sacrum: Change dressing every day. Burdell, [place]. ([id number]) Follow-up Appointments: Wound #3 Midline Sacrum: Return Appointment in 2 weeks. Off-Loading: Roho cushion for wheelchair Mattress - Alternating air mattress Turn and reposition every 2 hours Additional Orders / Instructions: Vitamin A; Vitamin C, Zinc Increase protein intake. Other: - Fax orders to SNF Attn: Dawn [phone] 1. My suggestion currently is to be that we go ahead and continue with the Vashe soaked gauze dressings to be performed daily and as needed if soiled or saturated. 2. I am also going to suggest he continue with the alternating air mattress as well as repositioning  which they seem to be doing well at the facility based on what I am seeing. 3. I also think as much as possible it would be good for the patient to be in different positions and not just lying down he can sit up but just not for extended periods of time. We will see patient back for reevaluation in 2 weeks here in the clinic. If anything worsens or changes patient will contact our office for additional recommendations. Electronic Signature(s) Signed: 02/22/2019 5:11:23 PM By: Worthy Keeler [name]  Entered By: Lenda KelpStone III, Lillieanna Tuohy on 02/22/2019 17:11:23 Kammerer, Juanluis EMarland Kitchen. ([id number]) -------------------------------------------------------------------------------- ROS/PFSH Details Patient Name: Vanderwoude, [name] Date of Service: 02/22/2019 4:00 PM Medical Record Number: [id number] Patient Account Number: [id number] Date of Birth/Sex: [birth year] (85 y.o. M) Treating RN: Rodell PernaScott, Dajea Primary Care Provider: Eleonore ChiquitoKWIATKOWSKI, PETER Other Clinician: Referring Provider: Eleonore ChiquitoKWIATKOWSKI, PETER Treating Provider/Extender: Linwood DibblesSTONE III, Deitrick Ferreri Weeks in Treatment: 14 Information Obtained From Patient Constitutional Symptoms (General Health) Complaints and Symptoms: Negative for: Fatigue; Fever; Chills; Marked Weight Change Respiratory Complaints and Symptoms: Negative for: Chronic or frequent coughs; Shortness of Breath Medical History: Positive for: Chronic Obstructive Pulmonary Disease (COPD) Past Medical History Notes: continuous O2 Cardiovascular Complaints and Symptoms: Negative for: Chest pain; LE edema Medical History: Positive for: Congestive Heart Failure; Hypertension Psychiatric Complaints and Symptoms: Negative for: Anxiety; Claustrophobia Eyes Medical History: Negative for: Cataracts; Glaucoma; Optic Neuritis Past Medical History Notes: macular degeneration Hematologic/Lymphatic Medical History: Negative for: Anemia; Hemophilia; Human Immunodeficiency Virus; Lymphedema; Sickle Cell  Disease Genitourinary Medical History: Negative for: End Stage Renal Disease Musculoskeletal Norgren, Harrol E. ([id number]) Medical History: Positive for: Osteoarthritis Negative for: Osteomyelitis Oncologic Medical History: Negative for: Received Chemotherapy; Received Radiation Immunizations Pneumococcal Vaccine: Received Pneumococcal Vaccination: Yes Immunization Notes: up to date Implantable Devices None Family and Social History Cancer: No; Diabetes: No; Heart Disease: Yes - Mother,Father; Hereditary Spherocytosis: No; Hypertension: Yes - Mother,Father; Kidney Disease: No; Lung Disease: No; Seizures: No; Stroke: No; Thyroid Problems: No; Tuberculosis: No; Former smoker - quit in 1996; Marital Status - Widowed; Alcohol Use: Never; Drug Use: No History; Caffeine Use: Daily; Financial Concerns: No; Food, Clothing or Shelter Needs: No; Support System Lacking: No; Transportation Concerns: No Physician Affirmation I have reviewed and agree with the above information. Electronic Signature(s) Signed: 02/22/2019 5:11:53 PM By: Lenda KelpStone III, Lakendria Nicastro [name] Signed: 03/05/2019 8:11:06 AM By: Rodell PernaScott, Dajea Entered By: Lenda KelpStone III, Davonn Flanery on 02/22/2019 16:16:04 Warren, Mandrell E. ([id number]) -------------------------------------------------------------------------------- SuperBill Details Patient Name: Tamm, [name] Date of Service: 02/22/2019 Medical Record Number: [id number] Patient Account Number: [id number] Date of Birth/Sex: [birth year] (85 y.o. M) Treating RN: Rodell PernaScott, Dajea Primary Care Provider: Eleonore ChiquitoKWIATKOWSKI, PETER Other Clinician: Referring Provider: Eleonore ChiquitoKWIATKOWSKI, PETER Treating Provider/Extender: Linwood DibblesSTONE III, Ladawn Boullion Weeks in Treatment: 14 Diagnosis Coding ICD-10 Codes Code Description L89.154 Pressure ulcer of sacral region, stage 4 M62.81 Muscle weakness (generalized) Physician Procedures CPT4 Code: [id number] Description: 99214 - WC PHYS LEVEL 4 - EST PT ICD-10 Diagnosis Description L89.154  Pressure ulcer of sacral region, stage 4 M62.81 Muscle weakness (generalized) Modifier: Quantity: 1 Electronic Signature(s) Signed: 02/22/2019 5:11:35 PM By: Lenda KelpStone III, Haig Gerardo [name] Entered By: Lenda KelpStone III, Jessic Standifer on 02/22/2019 17:11:34

## 2019-03-09 ENCOUNTER — Encounter: Payer: Medicare Other | Admitting: Physician Assistant

## 2019-03-14 NOTE — Progress Notes (Signed)
[name], [name] ChristmasVERETT E. ([id number]) Visit Report for 03/09/2019 Chief Complaint Document Details Patient Name: [name], [name] Date of Service: 03/09/2019 3:30 PM Medical Record Number: [id number] Patient Account Number: [id number] Date of Birth/Sex: 1926/08/31 (85 y.o. M) Treating RN: [name] Primary Care Provider: Eleonore ChiquitoKWIATKOWSKI, [name] Other Clinician: Referring Provider: Eleonore ChiquitoKWIATKOWSKI, [name] Treating Provider/Extender: [name] Weeks in Treatment: 16 Information Obtained from: Patient Chief Complaint Sacral pressure ulcer Electronic Signature(s) Signed: 03/10/2019 1:07:32 AM By: [name], [name] Entered By: [name], [name] on 03/09/2019 21:19:01 [name], [name] ([id number]) -------------------------------------------------------------------------------- HPI Details Patient Name: [name], [name] Date of Service: 03/09/2019 3:30 PM Medical Record Number: [id number] Patient Account Number: [id number] Date of Birth/Sex: 1926/08/31 (85 y.o. M) Treating RN: [name] Primary Care Provider: Eleonore ChiquitoKWIATKOWSKI, [name] Other Clinician: Referring Provider: Eleonore ChiquitoKWIATKOWSKI, [name] Treating Provider/Extender: [name] DibblesSTONE [name], Shauntel Prest Weeks in Treatment: 16 History of Present Illness HPI Description: 04/20/17; this is an 85 year old man who is a bit of an unfortunate story. He was initially admitted to hospital from 9/10 through 9/14 for an elective left total knee replacement secondary to osteoarthritis. On 9/17 a nurse in the hospital noted non-blanchable erythema. The [name] An admission note by Dr. Lyn [name] on 01/31/17 noted him unstageable sacral decubitus ulcer. The patient was then admitted to hospital from 9/24 through 10/8 now with a sacral wound felt to be infected. The patient had fever and chills. Required in his surgical IandD. Blood cultures grew Clostridium Ramosium and Bacteroides fragilis. I am not able to see the Bacteroides in care  everywhere today. Nevertheless the patient was seen by infectious disease and recommended for 6 weeks of ceftriaxone and Flagyl which should be completed. An x-ray in the hospital on 9/24 showed peroneal air which may be secondary to a decubitus ulcer no evidence for acute osseous or bone destructive change. An x-ray in the facility on 11/29 showed findings consistent with acute osteomyelitis of the distal coccyx.. Given the history here I suspect that this is probably not all acute and could have resulted at the time of infection the patient has a history of chronic atrial fibrillation on Xarelto, congestive heart failure with an EF of 40%, hypertension, BPH and osteoarthritis. He has been referred here for consideration of biopsy and culture of exposed coccyx It is difficult to follow the course of this patient's wound. Certainly he could have had underlying osteo in the hospital that wasn't well identified by plane xray and could have had more resistant organisms that were identified. I think we should wait for the results of the bone bx and cx today before initiating any antibiotics. Readmission: 05/23/18 patient presents today for reevaluation here in our clinic although it has been quite some time since he was last here. He is actually a patient of mine which I was [name]. Subsequently he has a significant stage for pressure ulcer to the gluteal region which coupled with his muscle weakness is causing a significant issue at this point as far as healing is concerned. It appears that this patient likely has an infection as well in the wound bed. He actually arrived today with no dressing in place and there was actually evidence of fecal material in the wound bed. His daughter was present for the evaluation today as well as a CNA from the facility. The patient has had this wound since I first began seeing him on October 2018. I last saw him for evaluation on January 05, 2018. This was that [name] when I was still going to the nursing facilities. He said no on-site wound care from a provider other than that the facility since that time. He has not been on any recent antibiotics for the wound. The wound was significantly smaller when I last saw him measuring 3.5 x 2.5 x 3. Today this is much larger and deeper. There's also a significant amount of necrotic material which was not present when I last saw him. 05/30/18 on evaluation today patient appears to be doing excellent in regard to his sacral wound especially as compared to my last evaluation with him. He has been tolerating the dressing changes without complication and though there is some Slough noted there is really no significant bone noted exposed in the wound bed other than a small piece that is working itself out within some granulation tissue. Overall I feel like he's made excellent progress this wound appears to be night and day compared to what it looks like last week. 06/13/18 on evaluation today patient's sacral wound though large appears to be doing excellent. There's no sign of pressure injury and in general the tissue quality the sacral region is excellent with great granulation there's no evidence of infection whatsoever. There is no odor. He also states that his pain is down from a 9-10 out of 10 two 2/10 that most but he did not even really react to me cleaning the wound today. 06/27/18 on evaluation today patient actually appears to be doing rather well in regard to his sacral wound. The overall appearance is improved and I'm very pleased in this regard. With that being said he continues to have discomfort and his daughter whom I did speak with today over the phone as well still has not had a chance to talk to the director of nursing Reisen, [name] ChristmasVERETT E. ([id number]) regarding the [facility] because he and many of the staff have been out on sick leave due to the flu. 07/11/18 on evaluation today  patient actually appears to be doing excellent in regard to his sacral words. He's been tolerating the dressing changes without complication overall I'm extremely pleased with how things appear today. He tells me that was her there would be a nurse practitioner coming to the facility that could see him on site. Again I explained that that is definitely something that is in the works although I'm not sure exactly which days she will be coming to this facility. Obviously depending on when and if we get this up and running that can definitely be a possibility for him to have first see him there and not have to come out to our office for evaluation and treatment. He would definitely like to consider this has with his daughter she states this was much easier on him. 11/13/18 on evaluation today patient was seen for a telehealth visit due to the Covid-19 Virus national emergency. Subsequently he is and is still nursing facility and his nurse at the facility who performs the wound care on a daily basis, Alcario Droughtrica, is present during the evaluation. She did measure the wound dimensions for us as well today which was excellent and very helpful. Subsequently it appears that in the past week he has noted or rather his wound nurse has noted that the appearance of the wound bed was much darker and did not appear to be nearly as healthy as what it was in the past. Therefore she actually wanted to set up this visit in  order for me to try to take a look. Unfortunately the video quality is not excellent and I really was not able to tell to what extent the wound may be different although it's also been several months since I last saw his wound he was being cared for by I Rosita Kea in the facility he was our nurse practitioner for skilled nursing facilities until just recently. Currently they have been using the Vashe which was previously instituted by myself when I last saw the patient in the clinic. 12/04/18 on  evaluation today patient is seen by way of telehealth visit at the nursing facility due to the fact that he has been quarantined by the facility simply in response to attempting to prevent any spread of Covid-19 Virus. If he comes out to the clinic he actually has this been two weeks in the [facility] and they really don't have the staff to do this. For that reason the trafficking patient's from going out if at all possible. Nonetheless the good news is he seems to be doing better according to what his nurse is telling me today. Measurement wise everything seemed to be significantly improved which was great news. Fortunately there's no evidence of active infection at this time. Patient states she's not having any significant pain which is excellent news he seems to be in good spirits. 12/21/18 upon evaluation today patient actually appears to be doing well with regard to his wound in the sacral region. He is seen by way of a telehealth visit with the nurse at his facility, [name]. With that being said his wound in particular according to the nurse appears to be doing well from the standpoint of the overall appearance of the wound bed. With that being said the measurements are also smaller today that she obtains which is excellent news. He doesn't seem to be having any pain and he tells me that he is attempting to offload more at this time as well which is good news that's definitely gonna be beneficial for him. No fevers, chills, nausea, or vomiting noted at this time. 01/11/2019 on evaluation today patient actually appears to be doing well with regard to sacral ulcer. He has been tolerating the dressing changes without complication. Fortunately there is no sign of active infection at this time. He has been tolerating offloading as well. It does sound like he is actually done a little bit more of this currently which is at least good news. Overall I am very happy in that regard. 02/01/2019  patient is seen today for a telehealth visit as he still resides in a skilled nursing facility and again they are not transporting patients out for wound care at this point due to risk of COVID. With that being said he seems to be doing well compared to last time I saw him the wound bed is showing signs of improvement according to what his nurse tells me today. This is excellent news. The wound is not measuring significantly smaller but is not as deep as it was previous. Overall this is great news. There is also no signs of infection at this time. 02/22/2019 patient is seen today for a telehealth visit at the nursing facility where he resides due to the COVID-19 national pandemic. Subsequently he seems to be doing well with regard to his sacral wound there is slight maceration the nurse does tell me about but again with the dressing was using that is not necessarily uncommon. With that being said the wound  is measuring about the same externally but significantly smaller as far as the depth is concerned this is excellent news. No fevers, chills, nausea, vomiting, or diarrhea. 03/09/19 on evaluation today patient's wound actually appear to be doing well he was seen by way of a telehealth visit with the facility where he resides as they are not transporting patients out. Fortunately his wound actually seems to be doing quite well and in fact is about half the size both in depth as well as actual size as compared to when I evaluated him two weeks ago. Fortunately he is feeling quite good about this. Electronic Signature(s) [name], [name] ROPER ([id number]) Signed: 03/10/2019 1:07:32 AM By: [name] Kelp [name] Entered By: [name] Kelp on 03/09/2019 21:32:30 Mcquinn, [name] Christmas ([id number]) -------------------------------------------------------------------------------- Physical Exam Details Patient Name: [name], [name] Date of Service: 03/09/2019 3:30 PM Medical Record Number: [id number] Patient  Account Number: [id number] Date of Birth/Sex: 09/23/1926 (85 y.o. M) Treating RN: [name] Sites Primary Care Provider: Eleonore Chiquito Other Clinician: Referring Provider: Eleonore Chiquito Treating Provider/Extender: STONE [name], Merced Brougham Weeks in Treatment: 16 Constitutional Obese and well-hydrated in no acute distress. Respiratory normal breathing without difficulty. Psychiatric this patient is able to make decisions and demonstrates good insight into disease process. Alert and Oriented x 3. pleasant and cooperative. Notes Patient's wound currently showed signs of good granulation he seems to be healing quite well there's no maturation overall he seems to be doing excellent at this point. Electronic Signature(s) Signed: 03/10/2019 1:07:32 AM By: [name] Kelp [name] Entered By: [name] Kelp on 03/09/2019 21:37:39 [name], [name] Christmas ([id number]) -------------------------------------------------------------------------------- Physician Orders Details Patient Name: [name], [name] Date of Service: 03/09/2019 3:30 PM Medical Record Number: [id number] Patient Account Number: [id number] Date of Birth/Sex: 09-15-1926 (85 y.o. M) Treating RN: [name] Sites Primary Care Provider: Eleonore Chiquito Other Clinician: Referring Provider: Eleonore Chiquito Treating Provider/Extender: [name] Dibbles, Neveyah Garzon Weeks in Treatment: 64 Verbal / Phone Orders: No Diagnosis Coding ICD-10 Coding Code Description L89.154 Pressure ulcer of sacral region, stage 4 M62.81 Muscle weakness (generalized) Wound Cleansing Wound #3 Midline Sacrum o Clean wound with Normal Saline. - Or house stock wound cleanser Primary Wound Dressing Wound #3 Midline Sacrum o Other: - Vashe soaked gauze packed into the wound Secondary Dressing Wound #3 Midline Sacrum o ABD pad - Secure with tape Dressing Change Frequency Wound #3 Midline Sacrum o Change dressing every day. Follow-up Appointments Wound #3 Midline  Sacrum o Return Appointment in 2 weeks. Off-Loading o Roho cushion for wheelchair o Mattress - Alternating air mattress o Turn and reposition every 2 hours Additional Orders / Instructions o Vitamin A; Vitamin C, Zinc o Increase protein intake. o Other: - Fax orders to SNF Attn: Dawn [phone] Electronic Signature(s) Signed: 03/10/2019 1:07:32 AM By: [name] Kelp [name] Entered By: [name] Kelp on 03/09/2019 21:38:01 Klostermann, [name] Christmas ([id number]) -------------------------------------------------------------------------------- Problem List Details Patient Name: [name], [name] Date of Service: 03/09/2019 3:30 PM Medical Record Number: [id number] Patient Account Number: [id number] Date of Birth/Sex: 1926-05-26 (85 y.o. M) Treating RN: [name] Sites Primary Care Provider: Eleonore Chiquito Other Clinician: Referring Provider: Eleonore Chiquito Treating Provider/Extender: [name] Dibbles, Reagen Goates Weeks in Treatment: 16 Active Problems ICD-10 Evaluated Encounter Code Description Active Date Today Diagnosis L89.154 Pressure ulcer of sacral region, stage 4 11/14/2018 No Yes M62.81 Muscle weakness (generalized) 11/14/2018 No Yes Inactive Problems Resolved Problems Electronic Signature(s) Signed: 03/10/2019 1:07:32 AM By: [name] Kelp [name] Entered By: [name] Kelp on 03/09/2019 21:18:54 [name], [name] (  [id number]) -------------------------------------------------------------------------------- Progress Note Details Patient Name: [name], [name] Date of Service: 03/09/2019 3:30 PM Medical Record Number: [id number] Patient Account Number: [id number] Date of Birth/Sex: [birth year] (85 y.o. M) Treating RN: [name] Sites Primary Care Provider: Eleonore Chiquito Other Clinician: Referring Provider: Eleonore Chiquito Treating Provider/Extender: [name] Dibbles, Zyheir Daft Weeks in Treatment: 16 Subjective Chief Complaint Information obtained from Patient Sacral pressure  ulcer History of Present Illness (HPI) 04/20/17; this is an 85 year old man who is a bit of an unfortunate story. He was initially admitted to hospital from 9/10 through 9/14 for an elective left total knee replacement secondary to osteoarthritis. On 9/17 a nurse in the hospital noted non- blanchable erythema. The [name]. An admission note by Dr. Lyn Hollingshead on 01/31/17 noted him unstageable sacral decubitus ulcer. The patient was then admitted to hospital from 9/24 through 10/8 now with a sacral wound felt to be infected. The patient had fever and chills. Required in his surgical IandD. Blood cultures grew Clostridium Ramosium and Bacteroides fragilis. I am not able to see the Bacteroides in care everywhere today. Nevertheless the patient was seen by infectious disease and recommended for 6 weeks of ceftriaxone and Flagyl which should be completed. An x-ray in the hospital on 9/24 showed peroneal air which may be secondary to a decubitus ulcer no evidence for acute osseous or bone destructive change. An x-ray in the facility on 11/29 showed findings consistent with acute osteomyelitis of the distal coccyx.. Given the history here I suspect that this is probably not all acute and could have resulted at the time of infection the patient has a history of chronic atrial fibrillation on Xarelto, congestive heart failure with an EF of 40%, hypertension, BPH and osteoarthritis. He has been referred here for consideration of biopsy and culture of exposed coccyx It is difficult to follow the course of this patient's wound. Certainly he could have had underlying osteo in the hospital that wasn't well identified by plane xray and could have had more resistant organisms that were identified. I think we should wait for the results of the bone bx and cx today before initiating any antibiotics. Readmission: 05/23/18 patient presents today for reevaluation here in our  clinic although it has been quite some time since he was last here. He is actually a patient of mine which I was seeing at [facility] nursing facility. Subsequently he has a significant stage for pressure ulcer to the gluteal region which coupled with his muscle weakness is causing a significant issue at this point as far as healing is concerned. It appears that this patient likely has an infection as well in the wound bed. He actually arrived today with no dressing in place and there was actually evidence of fecal material in the wound bed. His daughter was present for the evaluation today as well as a CNA from the facility. The patient has had this wound since I first began seeing him on October 2018. I last saw him for evaluation on January 05, 2018. This was that [place] when I was still going to the nursing facilities. He said no on-site wound care from a provider other than that the facility since that time. He has not been on any recent antibiotics for the wound. The wound was significantly smaller when I last saw him measuring 3.5 x 2.5 x 3. Today this is much larger and deeper. There's also a significant amount of necrotic material which was not present when I last  saw him. 05/30/18 on evaluation today patient appears to be doing excellent in regard to his sacral wound especially as compared to my last evaluation with him. He has been tolerating the dressing changes without complication and though there is some Slough noted there is really no significant bone noted exposed in the wound bed other than a small piece that is working itself out within some granulation tissue. Overall I feel like he's made excellent progress this wound appears to be night and day compared to what it looks like last week. 06/13/18 on evaluation today patient's sacral wound though large appears to be doing excellent. There's no sign of pressure injury and in general the tissue quality the sacral region is  excellent with great granulation there's no evidence of infection Redler, [name] ([id number]) whatsoever. There is no odor. He also states that his pain is down from a 9-10 out of 10 two 2/10 that most but he did not even really react to me cleaning the wound today. 06/27/18 on evaluation today patient actually appears to be doing rather well in regard to his sacral wound. The overall appearance is improved and I'm very pleased in this regard. With that being said he continues to have discomfort and his daughter whom I did speak with today over the phone as well still has not had a chance to talk to the director of nursing regarding the [facility] because he and many of the staff have been out on sick leave due to the flu. 07/11/18 on evaluation today patient actually appears to be doing excellent in regard to his sacral words. He's been tolerating the dressing changes without complication overall I'm extremely pleased with how things appear today. He tells me that was her there would be a nurse practitioner coming to the facility that could see him on site. Again I explained that that is definitely something that is in the works although I'm not sure exactly which days she will be coming to this facility. Obviously depending on when and if we get this up and running that can definitely be a possibility for him to have first see him there and not have to come out to our office for evaluation and treatment. He would definitely like to consider this has with his daughter she states this was much easier on him. 11/13/18 on evaluation today patient was seen for a telehealth visit due to the Covid-19 Virus national emergency. Subsequently he is and is still nursing facility and his nurse at the facility who performs the wound care on a daily basis, Alcario Droughtrica, is present during the evaluation. She did measure the wound dimensions for us as well today which was excellent and very helpful. Subsequently it appears  that in the past week he has noted or rather his wound nurse has noted that the appearance of the wound bed was much darker and did not appear to be nearly as healthy as what it was in the past. Therefore she actually wanted to set up this visit in order for me to try to take a look. Unfortunately the video quality is not excellent and I really was not able to tell to what extent the wound may be different although it's also been several months since I last saw his wound he was being cared for by I Rosita KeaKeisha Knight in the facility he was our nurse practitioner for skilled nursing facilities until just recently. Currently they have been using the Vashe which was previously instituted by  myself when I last saw the patient in the clinic. 12/04/18 on evaluation today patient is seen by way of telehealth visit at the nursing facility due to the fact that he has been quarantined by the facility simply in response to attempting to prevent any spread of Covid-19 Virus. If he comes out to the clinic he actually has this been two weeks in the [facility] and they really don't have the staff to do this. For that reason the trafficking patient's from going out if at all possible. Nonetheless the good news is he seems to be doing better according to what his nurse is telling me today. Measurement wise everything seemed to be significantly improved which was great news. Fortunately there's no evidence of active infection at this time. Patient states she's not having any significant pain which is excellent news he seems to be in good spirits. 12/21/18 upon evaluation today patient actually appears to be doing well with regard to his wound in the sacral region. He is seen by way of a telehealth visit with the nurse at his facility, [place]. With that being said his wound in particular according to the nurse appears to be doing well from the standpoint of the overall appearance of the wound bed. With that being  said the measurements are also smaller today that she obtains which is excellent news. He doesn't seem to be having any pain and he tells me that he is attempting to offload more at this time as well which is good news that's definitely gonna be beneficial for him. No fevers, chills, nausea, or vomiting noted at this time. 01/11/2019 on evaluation today patient actually appears to be doing well with regard to sacral ulcer. He has been tolerating the dressing changes without complication. Fortunately there is no sign of active infection at this time. He has been tolerating offloading as well. It does sound like he is actually done a little bit more of this currently which is at least good news. Overall I am very happy in that regard. 02/01/2019 patient is seen today for a telehealth visit as he still resides in a skilled nursing facility and again they are not transporting patients out for wound care at this point due to risk of COVID. With that being said he seems to be doing well compared to last time I saw him the wound bed is showing signs of improvement according to what his nurse tells me today. This is excellent news. The wound is not measuring significantly smaller but is not as deep as it was previous. Overall this is great news. There is also no signs of infection at this time. 02/22/2019 patient is seen today for a telehealth visit at the nursing facility where he resides due to the COVID-19 national pandemic. Subsequently he seems to be doing well with regard to his sacral wound there is slight maceration the nurse does tell me about but again with the dressing was using that is not necessarily uncommon. With that being said the wound is measuring about the same externally but significantly smaller as far as the depth is concerned this is excellent news. No fevers, chills, nausea, vomiting, or diarrhea. 03/09/19 on evaluation today patient's wound actually appear to be doing well he was seen  by way of a telehealth visit with Reisig, Teren E. ([id number]) the facility where he resides as they are not transporting patients out. Fortunately his wound actually seems to be doing quite well and in fact is about  half the size both in depth as well as actual size as compared to when I evaluated him two weeks ago. Fortunately he is feeling quite good about this. Patient History Information obtained from Patient. Allergies NSAIDS (Non-Steroidal Anti-Inflammatory Drug), Vicodin, morphine Family History Heart Disease - Mother,Father, Hypertension - Mother,Father, No family history of Cancer, Diabetes, Hereditary Spherocytosis, Kidney Disease, Lung Disease, Seizures, Stroke, Thyroid Problems, Tuberculosis. Social History Former smoker - quit in 1996, Marital Status - Widowed, Alcohol Use - Never, Drug Use - No History, Caffeine Use - Daily. Medical History Eyes Denies history of Cataracts, Glaucoma, Optic Neuritis Hematologic/Lymphatic Denies history of Anemia, Hemophilia, Human Immunodeficiency Virus, Lymphedema, Sickle Cell Disease Respiratory Patient has history of Chronic Obstructive Pulmonary Disease (COPD) Cardiovascular Patient has history of Congestive Heart Failure, Hypertension Genitourinary Denies history of End Stage Renal Disease Musculoskeletal Patient has history of Osteoarthritis Denies history of Osteomyelitis Oncologic Denies history of Received Chemotherapy, Received Radiation Medical And Surgical History Notes Eyes macular degeneration Respiratory continuous O2 Review of Systems (ROS) Constitutional Symptoms (General Health) Denies complaints or symptoms of Fatigue, Fever, Chills, Marked Weight Change. Respiratory Denies complaints or symptoms of Chronic or frequent coughs, Shortness of Breath. Cardiovascular Denies complaints or symptoms of Chest pain, LE edema. Psychiatric Denies complaints or symptoms of Anxiety, Claustrophobia. [name], [name]  ([id number]) Objective Constitutional Obese and well-hydrated in no acute distress. Respiratory normal breathing without difficulty. Psychiatric this patient is able to make decisions and demonstrates good insight into disease process. Alert and Oriented x 3. pleasant and cooperative. General Notes: Patient's wound currently showed signs of good granulation he seems to be healing quite well there's no maturation overall he seems to be doing excellent at this point. Integumentary (Hair, Skin) Wound #3 status is Open. Original cause of wound was Pressure Injury. The wound is located on the Midline Sacrum. The wound measures 1cm length x 1cm width x 0.5cm depth; 0.785cm^2 area and 0.393cm^3 volume. There is Fat Layer (Subcutaneous Tissue) Exposed exposed. There is no tunneling or undermining noted. There is a small amount of purulent drainage noted. The wound margin is distinct with the outline attached to the wound base. There is large (67-100%) red granulation within the wound bed. There is no necrotic tissue within the wound bed. Assessment Active Problems ICD-10 Pressure ulcer of sacral region, stage 4 Muscle weakness (generalized) Plan Wound Cleansing: Wound #3 Midline Sacrum: Clean wound with Normal Saline. - Or house stock wound cleanser Primary Wound Dressing: Wound #3 Midline Sacrum: Other: - Vashe soaked gauze packed into the wound Secondary Dressing: Wound #3 Midline Sacrum: ABD pad - Secure with tape Dressing Change Frequency: Wound #3 Midline Sacrum: Change dressing every day. Follow-up Appointments: Wound #3 Midline Sacrum: Return Appointment in 2 weeks. Off-Loading: Skyles, Jaquell E. ([id number]) Roho cushion for wheelchair Mattress - Alternating air mattress Turn and reposition every 2 hours Additional Orders / Instructions: Vitamin A; Vitamin C, Zinc Increase protein intake. Other: - Fax orders to SNF Attn: Dawn [phone] 1. I'm gonna recommend currently  that we continue with the Vashe soaked gauze as the patient seems to be doing well with this. His healing quite nicely. 2. I'm also gonna recommend continued offloading as I believe this has been extremely beneficial for him especially in recent weeks. 3. I also think patient needs to continue to need a nutritional diet including increased protein I think this also has been of benefit for him. Please see above for specific wound care orders. We will see patient for  re-evaluation in 2 week(s) here in the clinic. If anything worsens or changes patient will contact our office for additional recommendations. Electronic Signature(s) Signed: 03/10/2019 1:07:32 AM By: Worthy Keeler [name] Entered By: Worthy Keeler on 03/10/2019 01:06:40 [name], [name] Coup ([id number]) -------------------------------------------------------------------------------- ROS/PFSH Details Patient Name: Gruenwald, Deiontae E. Date of Service: 03/09/2019 3:30 PM Medical Record Number: [id number] Patient Account Number: [id number] Date of Birth/Sex: 08/31/1926 (85 y.o. M) Treating RN: Montey Hora Primary Care Provider: Bluford Kaufmann Other Clinician: Referring Provider: Bluford Kaufmann Treating Provider/Extender: Melburn Hake, Sevastian Witczak Weeks in Treatment: 16 Information Obtained From Patient Constitutional Symptoms (General Health) Complaints and Symptoms: Negative for: Fatigue; Fever; Chills; Marked Weight Change Respiratory Complaints and Symptoms: Negative for: Chronic or frequent coughs; Shortness of Breath Medical History: Positive for: Chronic Obstructive Pulmonary Disease (COPD) Past Medical History Notes: continuous O2 Cardiovascular Complaints and Symptoms: Negative for: Chest pain; LE edema Medical History: Positive for: Congestive Heart Failure; Hypertension Psychiatric Complaints and Symptoms: Negative for: Anxiety; Claustrophobia Eyes Medical History: Negative for: Cataracts; Glaucoma; Optic  Neuritis Past Medical History Notes: macular degeneration Hematologic/Lymphatic Medical History: Negative for: Anemia; Hemophilia; Human Immunodeficiency Virus; Lymphedema; Sickle Cell Disease Genitourinary Medical History: Negative for: End Stage Renal Disease Musculoskeletal Winthrop, Cabell E. ([id number]) Medical History: Positive for: Osteoarthritis Negative for: Osteomyelitis Oncologic Medical History: Negative for: Received Chemotherapy; Received Radiation Immunizations Pneumococcal Vaccine: Received Pneumococcal Vaccination: Yes Immunization Notes: up to date Implantable Devices None Family and Social History Cancer: No; Diabetes: No; Heart Disease: Yes - Mother,Father; Hereditary Spherocytosis: No; Hypertension: Yes - Mother,Father; Kidney Disease: No; Lung Disease: No; Seizures: No; Stroke: No; Thyroid Problems: No; Tuberculosis: No; Former smoker - quit in 1996; Marital Status - Widowed; Alcohol Use: Never; Drug Use: No History; Caffeine Use: Daily; Financial Concerns: No; Food, Clothing or Shelter Needs: No; Support System Lacking: No; Transportation Concerns: No Physician Affirmation I have reviewed and agree with the above information. Electronic Signature(s) Signed: 03/10/2019 1:07:32 AM By: Worthy Keeler [name] Signed: 03/14/2019 4:47:48 PM By: Montey Hora Entered By: Worthy Keeler on 03/09/2019 21:18:40 Bunte, Shane E. ([id number]) -------------------------------------------------------------------------------- SuperBill Details Patient Name: Chachere, Oren E. Date of Service: 03/09/2019 Medical Record Number: [id number] Patient Account Number: [id number] Date of Birth/Sex: [birth year] (85 y.o. M) Treating RN: Montey Hora Primary Care Provider: Bluford Kaufmann Other Clinician: Referring Provider: Bluford Kaufmann Treating Provider/Extender: Melburn Hake, Buffi Ewton Weeks in Treatment: 16 Diagnosis Coding ICD-10 Codes Code Description L89.154 Pressure  ulcer of sacral region, stage 4 M62.81 Muscle weakness (generalized) Physician Procedures CPT4 Code: [id number] Description: 14431 - WC PHYS LEVEL 3 - EST PT ICD-10 Diagnosis Description L89.154 Pressure ulcer of sacral region, stage 4 M62.81 Muscle weakness (generalized) Modifier: Quantity: 1 Electronic Signature(s) Signed: 03/10/2019 1:07:32 AM By: Worthy Keeler [name] Entered By: Worthy Keeler on 03/10/2019 01:07:14

## 2019-03-14 NOTE — Progress Notes (Signed)
[name] ([id number]) Visit Report for 03/09/2019 Allergy List Details Patient Name: [name], [name] Date of Service: 03/09/2019 3:30 PM Medical Record Number: [id number] Patient Account Number: [id number] Date of Birth/Sex: July 06, 1926 (85 y.o. M) Treating RN: Montey Hora Primary Care Prarthana Parlin: Bluford Kaufmann Other Clinician: Referring Laquasha Groome: Bluford Kaufmann Treating Errin Whitelaw/Extender: STONE III, HOYT Weeks in Treatment: 16 Allergies Active Allergies NSAIDS (Non-Steroidal Anti-Inflammatory Drug) Vicodin morphine Allergy Notes Electronic Signature(s) Signed: 03/10/2019 1:07:32 AM By: Worthy Keeler [name] Entered By: Worthy Keeler on 03/09/2019 21:18:22 [place], Lelan E. ([id number]) -------------------------------------------------------------------------------- Wound Assessment Details Patient Name: [name], [name] Date of Service: 03/09/2019 3:30 PM Medical Record Number: [id number] Patient Account Number: [id number] Date of Birth/Sex: Dec 31, 1926 (85 y.o. M) Treating RN: Montey Hora Primary Care Mackenzie Lia: Bluford Kaufmann Other Clinician: Referring Curtistine Pettitt: Bluford Kaufmann Treating Miyu Fenderson/Extender: Melburn Hake, HOYT Weeks in Treatment: 16 Wound Status Wound Number: 3 Primary Pressure Ulcer Etiology: Wound Location: Sacrum - Midline Wound Open Wounding Event: Pressure Injury Status: Date Acquired: 05/28/2017 Comorbid Chronic Obstructive Pulmonary Disease Weeks Of Treatment: 16 History: (COPD), Congestive Heart Failure, Clustered Wound: No Hypertension, Osteoarthritis Wound Measurements Length: (cm) 1 Width: (cm) 1 Depth: (cm) 0.5 Area: (cm) 0.785 Volume: (cm) 0.393 % Reduction in Area: 88.1% % Reduction in Volume: 97.6% Epithelialization: Medium (34-66%) Tunneling: No Undermining: No Wound Description Classification: Category/Stage IV Wound Margin: Distinct, outline attached Exudate Amount: Small Exudate Type:  Purulent Exudate Color: yellow, brown, green Foul Odor After Cleansing: No Slough/Fibrino Yes Wound Bed Granulation Amount: Large (67-100%) Exposed Structure Granulation Quality: Red Fascia Exposed: No Necrotic Amount: None Present (0%) Fat Layer (Subcutaneous Tissue) Exposed: Yes Tendon Exposed: No Muscle Exposed: No Joint Exposed: No Bone Exposed: No Electronic Signature(s) Signed: 03/10/2019 1:07:32 AM By: Worthy Keeler [name] Signed: 03/14/2019 4:47:48 PM By: Montey Hora Entered By: Worthy Keeler on 03/09/2019 21:18:15

## 2019-03-21 ENCOUNTER — Emergency Department (HOSPITAL_COMMUNITY): Payer: Medicare Other

## 2019-03-21 ENCOUNTER — Inpatient Hospital Stay (HOSPITAL_COMMUNITY)
Admission: EM | Admit: 2019-03-21 | Discharge: 2019-04-17 | DRG: 689 | Disposition: E | Payer: Medicare Other | Attending: Internal Medicine | Admitting: Internal Medicine

## 2019-03-21 ENCOUNTER — Encounter (HOSPITAL_COMMUNITY): Payer: Self-pay

## 2019-03-21 ENCOUNTER — Other Ambulatory Visit: Payer: Self-pay

## 2019-03-21 DIAGNOSIS — B964 Proteus (mirabilis) (morganii) as the cause of diseases classified elsewhere: Secondary | ICD-10-CM | POA: Diagnosis present

## 2019-03-21 DIAGNOSIS — N281 Cyst of kidney, acquired: Secondary | ICD-10-CM

## 2019-03-21 DIAGNOSIS — J45909 Unspecified asthma, uncomplicated: Secondary | ICD-10-CM | POA: Diagnosis not present

## 2019-03-21 DIAGNOSIS — H3530 Unspecified macular degeneration: Secondary | ICD-10-CM | POA: Diagnosis present

## 2019-03-21 DIAGNOSIS — I452 Bifascicular block: Secondary | ICD-10-CM | POA: Diagnosis present

## 2019-03-21 DIAGNOSIS — Z886 Allergy status to analgesic agent status: Secondary | ICD-10-CM

## 2019-03-21 DIAGNOSIS — I480 Paroxysmal atrial fibrillation: Secondary | ICD-10-CM

## 2019-03-21 DIAGNOSIS — Z96652 Presence of left artificial knee joint: Secondary | ICD-10-CM | POA: Diagnosis present

## 2019-03-21 DIAGNOSIS — I5032 Chronic diastolic (congestive) heart failure: Secondary | ICD-10-CM | POA: Diagnosis present

## 2019-03-21 DIAGNOSIS — Z20828 Contact with and (suspected) exposure to other viral communicable diseases: Secondary | ICD-10-CM | POA: Diagnosis present

## 2019-03-21 DIAGNOSIS — D689 Coagulation defect, unspecified: Secondary | ICD-10-CM | POA: Diagnosis not present

## 2019-03-21 DIAGNOSIS — F411 Generalized anxiety disorder: Secondary | ICD-10-CM

## 2019-03-21 DIAGNOSIS — S32040A Wedge compression fracture of fourth lumbar vertebra, initial encounter for closed fracture: Secondary | ICD-10-CM | POA: Diagnosis present

## 2019-03-21 DIAGNOSIS — R310 Gross hematuria: Secondary | ICD-10-CM | POA: Diagnosis not present

## 2019-03-21 DIAGNOSIS — Z7189 Other specified counseling: Secondary | ICD-10-CM

## 2019-03-21 DIAGNOSIS — J9621 Acute and chronic respiratory failure with hypoxia: Secondary | ICD-10-CM

## 2019-03-21 DIAGNOSIS — I428 Other cardiomyopathies: Secondary | ICD-10-CM | POA: Diagnosis present

## 2019-03-21 DIAGNOSIS — R0602 Shortness of breath: Secondary | ICD-10-CM

## 2019-03-21 DIAGNOSIS — R0600 Dyspnea, unspecified: Secondary | ICD-10-CM

## 2019-03-21 DIAGNOSIS — Z8249 Family history of ischemic heart disease and other diseases of the circulatory system: Secondary | ICD-10-CM

## 2019-03-21 DIAGNOSIS — K219 Gastro-esophageal reflux disease without esophagitis: Secondary | ICD-10-CM | POA: Diagnosis present

## 2019-03-21 DIAGNOSIS — L89159 Pressure ulcer of sacral region, unspecified stage: Secondary | ICD-10-CM | POA: Diagnosis present

## 2019-03-21 DIAGNOSIS — M4856XA Collapsed vertebra, not elsewhere classified, lumbar region, initial encounter for fracture: Secondary | ICD-10-CM | POA: Diagnosis present

## 2019-03-21 DIAGNOSIS — R269 Unspecified abnormalities of gait and mobility: Secondary | ICD-10-CM | POA: Diagnosis present

## 2019-03-21 DIAGNOSIS — Z515 Encounter for palliative care: Secondary | ICD-10-CM

## 2019-03-21 DIAGNOSIS — Z7901 Long term (current) use of anticoagulants: Secondary | ICD-10-CM

## 2019-03-21 DIAGNOSIS — Z66 Do not resuscitate: Secondary | ICD-10-CM | POA: Diagnosis not present

## 2019-03-21 DIAGNOSIS — J449 Chronic obstructive pulmonary disease, unspecified: Secondary | ICD-10-CM | POA: Diagnosis present

## 2019-03-21 DIAGNOSIS — I4891 Unspecified atrial fibrillation: Secondary | ICD-10-CM | POA: Diagnosis present

## 2019-03-21 DIAGNOSIS — E785 Hyperlipidemia, unspecified: Secondary | ICD-10-CM | POA: Diagnosis present

## 2019-03-21 DIAGNOSIS — N151 Renal and perinephric abscess: Principal | ICD-10-CM

## 2019-03-21 DIAGNOSIS — L89154 Pressure ulcer of sacral region, stage 4: Secondary | ICD-10-CM | POA: Diagnosis present

## 2019-03-21 DIAGNOSIS — T17908A Unspecified foreign body in respiratory tract, part unspecified causing other injury, initial encounter: Secondary | ICD-10-CM

## 2019-03-21 DIAGNOSIS — I130 Hypertensive heart and chronic kidney disease with heart failure and stage 1 through stage 4 chronic kidney disease, or unspecified chronic kidney disease: Secondary | ICD-10-CM | POA: Diagnosis present

## 2019-03-21 DIAGNOSIS — G92 Toxic encephalopathy: Secondary | ICD-10-CM | POA: Diagnosis not present

## 2019-03-21 DIAGNOSIS — J9622 Acute and chronic respiratory failure with hypercapnia: Secondary | ICD-10-CM | POA: Diagnosis not present

## 2019-03-21 DIAGNOSIS — X58XXXA Exposure to other specified factors, initial encounter: Secondary | ICD-10-CM | POA: Diagnosis present

## 2019-03-21 DIAGNOSIS — I2721 Secondary pulmonary arterial hypertension: Secondary | ICD-10-CM | POA: Diagnosis present

## 2019-03-21 DIAGNOSIS — G629 Polyneuropathy, unspecified: Secondary | ICD-10-CM | POA: Diagnosis present

## 2019-03-21 DIAGNOSIS — I4510 Unspecified right bundle-branch block: Secondary | ICD-10-CM | POA: Diagnosis present

## 2019-03-21 DIAGNOSIS — I5042 Chronic combined systolic (congestive) and diastolic (congestive) heart failure: Secondary | ICD-10-CM | POA: Diagnosis present

## 2019-03-21 DIAGNOSIS — K567 Ileus, unspecified: Secondary | ICD-10-CM | POA: Diagnosis present

## 2019-03-21 DIAGNOSIS — J690 Pneumonitis due to inhalation of food and vomit: Secondary | ICD-10-CM | POA: Diagnosis not present

## 2019-03-21 DIAGNOSIS — Z96642 Presence of left artificial hip joint: Secondary | ICD-10-CM | POA: Diagnosis present

## 2019-03-21 DIAGNOSIS — R5381 Other malaise: Secondary | ICD-10-CM | POA: Diagnosis present

## 2019-03-21 DIAGNOSIS — I4821 Permanent atrial fibrillation: Secondary | ICD-10-CM | POA: Diagnosis present

## 2019-03-21 DIAGNOSIS — N138 Other obstructive and reflux uropathy: Secondary | ICD-10-CM | POA: Diagnosis present

## 2019-03-21 DIAGNOSIS — N2889 Other specified disorders of kidney and ureter: Secondary | ICD-10-CM | POA: Diagnosis present

## 2019-03-21 DIAGNOSIS — Z79899 Other long term (current) drug therapy: Secondary | ICD-10-CM

## 2019-03-21 DIAGNOSIS — E662 Morbid (severe) obesity with alveolar hypoventilation: Secondary | ICD-10-CM | POA: Diagnosis present

## 2019-03-21 DIAGNOSIS — N1330 Unspecified hydronephrosis: Secondary | ICD-10-CM

## 2019-03-21 DIAGNOSIS — J90 Pleural effusion, not elsewhere classified: Secondary | ICD-10-CM

## 2019-03-21 DIAGNOSIS — R0902 Hypoxemia: Secondary | ICD-10-CM

## 2019-03-21 DIAGNOSIS — Z9981 Dependence on supplemental oxygen: Secondary | ICD-10-CM

## 2019-03-21 DIAGNOSIS — I700 Atherosclerosis of aorta: Secondary | ICD-10-CM | POA: Diagnosis present

## 2019-03-21 DIAGNOSIS — I9589 Other hypotension: Secondary | ICD-10-CM | POA: Diagnosis present

## 2019-03-21 DIAGNOSIS — N401 Enlarged prostate with lower urinary tract symptoms: Secondary | ICD-10-CM | POA: Diagnosis present

## 2019-03-21 DIAGNOSIS — N136 Pyonephrosis: Secondary | ICD-10-CM | POA: Diagnosis present

## 2019-03-21 DIAGNOSIS — Z87891 Personal history of nicotine dependence: Secondary | ICD-10-CM

## 2019-03-21 DIAGNOSIS — Z6836 Body mass index (BMI) 36.0-36.9, adult: Secondary | ICD-10-CM

## 2019-03-21 DIAGNOSIS — E876 Hypokalemia: Secondary | ICD-10-CM | POA: Diagnosis present

## 2019-03-21 DIAGNOSIS — Z79891 Long term (current) use of opiate analgesic: Secondary | ICD-10-CM

## 2019-03-21 DIAGNOSIS — Z885 Allergy status to narcotic agent status: Secondary | ICD-10-CM

## 2019-03-21 DIAGNOSIS — Z86010 Personal history of colonic polyps: Secondary | ICD-10-CM

## 2019-03-21 DIAGNOSIS — M549 Dorsalgia, unspecified: Secondary | ICD-10-CM | POA: Diagnosis present

## 2019-03-21 DIAGNOSIS — Z993 Dependence on wheelchair: Secondary | ICD-10-CM

## 2019-03-21 DIAGNOSIS — G8929 Other chronic pain: Secondary | ICD-10-CM | POA: Diagnosis present

## 2019-03-21 DIAGNOSIS — R1900 Intra-abdominal and pelvic swelling, mass and lump, unspecified site: Secondary | ICD-10-CM

## 2019-03-21 DIAGNOSIS — Z9889 Other specified postprocedural states: Secondary | ICD-10-CM

## 2019-03-21 LAB — URINALYSIS, ROUTINE W REFLEX MICROSCOPIC
Bacteria, UA: NONE SEEN
Bilirubin Urine: NEGATIVE
Glucose, UA: NEGATIVE mg/dL
Ketones, ur: NEGATIVE mg/dL
Nitrite: NEGATIVE
Protein, ur: NEGATIVE mg/dL
Specific Gravity, Urine: 1.011 (ref 1.005–1.030)
pH: 6 (ref 5.0–8.0)

## 2019-03-21 LAB — CBC WITH DIFFERENTIAL/PLATELET
Abs Immature Granulocytes: 0.1 10*3/uL — ABNORMAL HIGH (ref 0.00–0.07)
Basophils Absolute: 0 10*3/uL (ref 0.0–0.1)
Basophils Relative: 0 %
Eosinophils Absolute: 0.1 10*3/uL (ref 0.0–0.5)
Eosinophils Relative: 1 %
HCT: 34.3 % — ABNORMAL LOW (ref 39.0–52.0)
Hemoglobin: 10.9 g/dL — ABNORMAL LOW (ref 13.0–17.0)
Immature Granulocytes: 1 %
Lymphocytes Relative: 18 %
Lymphs Abs: 1.9 10*3/uL (ref 0.7–4.0)
MCH: 31.5 pg (ref 26.0–34.0)
MCHC: 31.8 g/dL (ref 30.0–36.0)
MCV: 99.1 fL (ref 80.0–100.0)
Monocytes Absolute: 1.1 10*3/uL — ABNORMAL HIGH (ref 0.1–1.0)
Monocytes Relative: 10 %
Neutro Abs: 7.4 10*3/uL (ref 1.7–7.7)
Neutrophils Relative %: 70 %
Platelets: 279 10*3/uL (ref 150–400)
RBC: 3.46 MIL/uL — ABNORMAL LOW (ref 4.22–5.81)
RDW: 14 % (ref 11.5–15.5)
WBC: 10.6 10*3/uL — ABNORMAL HIGH (ref 4.0–10.5)
nRBC: 0 % (ref 0.0–0.2)

## 2019-03-21 LAB — COMPREHENSIVE METABOLIC PANEL
ALT: 15 U/L (ref 0–44)
AST: 19 U/L (ref 15–41)
Albumin: 2 g/dL — ABNORMAL LOW (ref 3.5–5.0)
Alkaline Phosphatase: 64 U/L (ref 38–126)
Anion gap: 11 (ref 5–15)
BUN: 22 mg/dL (ref 8–23)
CO2: 37 mmol/L — ABNORMAL HIGH (ref 22–32)
Calcium: 8.1 mg/dL — ABNORMAL LOW (ref 8.9–10.3)
Chloride: 94 mmol/L — ABNORMAL LOW (ref 98–111)
Creatinine, Ser: 0.74 mg/dL (ref 0.61–1.24)
GFR calc Af Amer: >60 mL/min (ref >60)
GFR calc non Af Amer: >60 mL/min (ref >60)
Glucose, Bld: 111 mg/dL — ABNORMAL HIGH (ref 70–99)
Potassium: 2.9 mmol/L — ABNORMAL LOW (ref 3.5–5.1)
Sodium: 142 mmol/L (ref 135–145)
Total Bilirubin: 0.4 mg/dL (ref 0.3–1.2)
Total Protein: 5.6 g/dL — ABNORMAL LOW (ref 6.5–8.1)

## 2019-03-21 LAB — LIPASE, BLOOD: Lipase: 20 U/L (ref 11–51)

## 2019-03-21 LAB — LACTIC ACID, PLASMA: Lactic Acid, Venous: 0.8 mmol/L (ref 0.5–1.9)

## 2019-03-21 LAB — SARS CORONAVIRUS 2 (TAT 6-24 HRS): SARS Coronavirus 2: NEGATIVE

## 2019-03-21 MED ORDER — ACETAMINOPHEN 650 MG RE SUPP: 650.0000 mg | Freq: Four times a day (QID) | RECTAL | Status: DC | PRN

## 2019-03-21 MED ORDER — ACETAMINOPHEN 325 MG PO TABS: 650.0000 mg | ORAL_TABLET | Freq: Four times a day (QID) | ORAL | Status: DC | PRN

## 2019-03-21 MED ORDER — SODIUM CHLORIDE 0.9 % IV SOLN: INTRAVENOUS | Status: DC

## 2019-03-21 MED ORDER — IOHEXOL 300 MG/ML  SOLN
100.0000 mL | Freq: Once | INTRAMUSCULAR | Status: AC | PRN
  Administered 2019-03-21: 19:00:00 100 mL via INTRAVENOUS

## 2019-03-21 MED ORDER — UMECLIDINIUM BROMIDE 62.5 MCG/INH IN AEPB
1.0000 | INHALATION_SPRAY | Freq: Every day | RESPIRATORY_TRACT | Status: DC
  Administered 2019-03-28 – 2019-04-10 (×13): 1 via RESPIRATORY_TRACT
  Filled 2019-03-21 (×3): qty 7

## 2019-03-21 MED ORDER — ALBUTEROL SULFATE (2.5 MG/3ML) 0.083% IN NEBU
2.5000 mg | INHALATION_SOLUTION | RESPIRATORY_TRACT | Status: DC | PRN
  Administered 2019-03-27: 2.5 mg via RESPIRATORY_TRACT
  Filled 2019-03-21 (×4): qty 3

## 2019-03-21 MED ORDER — POTASSIUM CHLORIDE 10 MEQ/100ML IV SOLN
10.0000 meq | INTRAVENOUS | Status: AC
  Administered 2019-03-21 – 2019-03-22 (×3): 10 meq via INTRAVENOUS
  Filled 2019-03-21 (×3): qty 100

## 2019-03-21 MED ORDER — SODIUM CHLORIDE 0.9% FLUSH
3.0000 mL | Freq: Two times a day (BID) | INTRAVENOUS | Status: DC
  Administered 2019-03-23 – 2019-03-28 (×7): 3 mL via INTRAVENOUS

## 2019-03-21 MED ORDER — SODIUM CHLORIDE 0.9 % IV SOLN
250.0000 mL | INTRAVENOUS | Status: DC | PRN
  Administered 2019-03-22 – 2019-03-26 (×4): 250 mL via INTRAVENOUS

## 2019-03-21 MED ORDER — FENTANYL CITRATE (PF) 100 MCG/2ML IJ SOLN
12.5000 ug | INTRAMUSCULAR | Status: DC | PRN
  Administered 2019-03-22 – 2019-04-12 (×4): 25 ug via INTRAVENOUS
  Filled 2019-03-21 (×4): qty 2

## 2019-03-21 MED ORDER — MAGNESIUM SULFATE IN D5W 1-5 GM/100ML-% IV SOLN
1.0000 g | Freq: Once | INTRAVENOUS | Status: AC
  Administered 2019-03-21: 1 g via INTRAVENOUS
  Filled 2019-03-21: qty 100

## 2019-03-21 MED ORDER — POTASSIUM CHLORIDE CRYS ER 20 MEQ PO TBCR
20.0000 meq | EXTENDED_RELEASE_TABLET | Freq: Once | ORAL | Status: AC
  Administered 2019-03-21: 22:00:00 20 meq via ORAL
  Filled 2019-03-21: qty 1

## 2019-03-21 MED ORDER — SODIUM CHLORIDE 0.9 % IV SOLN
1.0000 g | INTRAVENOUS | Status: DC
  Filled 2019-03-21: qty 10

## 2019-03-21 MED ORDER — ONDANSETRON HCL 4 MG/2ML IJ SOLN
4.0000 mg | Freq: Four times a day (QID) | INTRAMUSCULAR | Status: DC | PRN
  Administered 2019-03-24 – 2019-03-29 (×2): 4 mg via INTRAVENOUS
  Filled 2019-03-21 (×2): qty 2

## 2019-03-21 MED ORDER — SODIUM CHLORIDE 0.9% FLUSH: 3.0000 mL | INTRAVENOUS | Status: DC | PRN

## 2019-03-21 MED ORDER — SODIUM CHLORIDE 0.9% FLUSH
3.0000 mL | Freq: Two times a day (BID) | INTRAVENOUS | Status: DC
  Administered 2019-03-22 – 2019-04-12 (×23): 3 mL via INTRAVENOUS

## 2019-03-21 NOTE — ED Notes (Addendum)
Patient transported to X-ray 

## 2019-03-21 NOTE — ED Provider Notes (Signed)
[name] [facility] EMERGENCY DEPARTMENT Provider Note   CSN: [id number] Arrival date & time: 04/05/2019  1523     History   Chief Complaint Chief Complaint  Patient presents with  . Bloated  . Constipation    HPI [name] is a 85 y.o. male.     85 year old male presents with several days of abdominal distention along with diarrhea.  No fever or chills.  No current emesis.  Diarrhea has been watery denies any recent use of antibiotics.  States that pain is been persistent and gradually getting worse and characterizes cramping.  Denies any urinary symptoms.  No prior history of same.  No treatment used prior to arrival.     Past Medical History:  Diagnosis Date  . A-fib (HCC)   . Acute systolic CHF (congestive heart failure), NYHA class 3 -- Unclear etilogy (? Afib related) EF down from 60-70% to 40%. 07/05/2012   Echo 2/21: LV upper limits of normal. EF- 40%. Cannot assess Diastolic function - Aortic Sclerosis, Mod-Severe Left Atrial dilation; Mild RV & RA dilation; Moderately elevated PA peak pressure: 53mm Hg     . Arthritis    knees  . Asthma   . ASTHMA 02/06/2007   Qualifier: Diagnosis of  By: Claiborne Billings CMA, Terance Ice    . BENIGN PROSTATIC HYPERTROPHY 05/13/2010   Qualifier: Diagnosis of  By: Amador Cunas  MD, Janett Labella   . Bifascicular block 12/18/2013  . BPH (benign prostatic hypertrophy)   . Bronchospasm, exercise-induced   . CHF (congestive heart failure) (HCC)    systolic, EF 40% (07/07/2012)  . Chronic kidney disease    stabilized, due to infection  . Chronic rhinitis 06/02/2009   Qualifier: Diagnosis of  By: Lovell Sheehan MD, Balinda Quails   . DJD (degenerative joint disease), lumbar 01/18/2012  . Dyslipidemia   . Edema    Bilateral - left lower leg greater than right- wears compression stockings  . General weakness 07/05/2012  . GERD (gastroesophageal reflux disease)   . History of elevated glucose 10/01/2009   Qualifier: Diagnosis of  By: Lovell Sheehan MD, Balinda Quails   .  Hx of colonic polyps 11/02/2011  . Hypertension   . INSOMNIA, CHRONIC, MILD 01/02/2008   Qualifier: Diagnosis of  By: Lovell Sheehan MD, Balinda Quails   . Macular degeneration 08-06-13   bilateral -sight impaired  . Obesity   . Paroxysmal Atrial fibrillation - admitted with RVR 01/18/2012  . S/P left THA, AA 08/13/2013  . Shortness of breath     Patient Active Problem List   Diagnosis Date Noted  . Acute encephalopathy 05/14/2017  . Permanent atrial fibrillation (HCC) 05/14/2017  . Chronic respiratory failure with hypoxia (HCC) 05/14/2017  . Chronic combined systolic and diastolic CHF (congestive heart failure) (HCC) 05/14/2017  . 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 Infected pressure ulcer 02/07/2017  . Normocytic anemia 02/07/2017  . A-fib (HCC) 01/31/2017  . Polyneuropathy 01/31/2017  . S/P left TKA 01/24/2017  . Non-ischemic cardiomyopathy (HCC) 10/15/2016  . Chronic systolic congestive heart failure (HCC) 05/28/2016  . Orthostatic hypotension 09/20/2015  . Impaired glucose tolerance 11/25/2014  . Bifascicular block 12/18/2013  . Bilateral edema of lower extremity 10/25/2013  . S/P left THA, AA 08/13/2013  . Chronic anticoagulation 12/06/2012  . General weakness 07/05/2012  . Hypotension 01/18/2012  . Paroxysmal Atrial fibrillation - admitted with RVR 01/18/2012  . DJD (degenerative joint disease), lumbar 01/18/2012  . Hx of colonic polyps 11/02/2011  . BENIGN PROSTATIC HYPERTROPHY 05/13/2010  .  SEBORRHEA CAPITIS 02/13/2010  . CHRONIC RHINITIS 06/02/2009  . Hyperlipidemia 11/01/2008  . INSOMNIA, CHRONIC, MILD 01/02/2008  . Essential hypertension 02/06/2007  . ASTHMA 02/06/2007    Past Surgical History:  Procedure Laterality Date  . CARDIOVERSION N/A 07/07/2012   Procedure: CARDIOVERSION;  Surgeon: Chrystie NoseKenneth C. Hilty, MD;  [facility];  Service: Cardiovascular;  Laterality: N/A;  . CARDIOVERSION N/A 08/30/2012   Procedure: CARDIOVERSION;  Surgeon: Chrystie NoseKenneth C. Hilty, MD;   [facility];  Service: Cardiovascular;  Laterality: N/A;  . IR FLUORO GUIDE CV LINE RIGHT  04/15/2017  . IR US GUIDE VASC ACCESS RIGHT  04/15/2017  . IRRIGATION AND DEBRIDEMENT ABSCESS N/A 02/08/2017   Procedure: IRRIGATION AND DEBRIDEMENT SACRAL ULCER;  Surgeon: Berna Bueonnor, Chelsea A, MD;  Location: [facility] ORS;  Service: General;  Laterality: N/A;  . TOTAL HIP ARTHROPLASTY Left 08/13/2013   Procedure: LEFT TOTAL HIP ARTHROPLASTY ANTERIOR APPROACH;  Surgeon: Shelda PalMatthew D Olin, MD;  Location: [facility] ORS;  Service: Orthopedics;  Laterality: Left;  . TOTAL KNEE ARTHROPLASTY Left 01/24/2017   Procedure: LEFT TOTAL KNEE ARTHROPLASTY;  Surgeon: Durene Romanslin, Matthew, MD;  Location: [facility] ORS;  Service: Orthopedics;  Laterality: Left;  90 mins  . TRANSTHORACIC ECHOCARDIOGRAM  07/07/2012   ef 40%; mild MR; LA mod-severely dilated; RA mildly dilated; RV systolic function mildly reduced; RV systolic pressure increase - mod pulm htn  . VASCULAR SURGERY  removed varicose veins  . VASECTOMY    . viritual colonoscopy  04/2017   Dr Marca AnconaKarki        Home Medications    Prior to Admission medications   Medication Sig Start Date End Date Taking? Authorizing Provider  acetaminophen (TYLENOL) 500 MG tablet Take 2 tablets (1,000 mg total) by mouth every 8 (eight) hours. 01/24/17   Lanney GinsBabish, Matthew, [name]  albuterol (PROVENTIL HFA;VENTOLIN HFA) 108 (90 Base) MCG/ACT inhaler Inhale 2 puffs into the lungs. Every 6 hours as needed for wheezing or shortness of breath    [provider]  Amino Acids-Protein Hydrolys (FEEDING SUPPLEMENT, PRO-STAT SUGAR FREE 64,) LIQD Take 30 mLs by mouth 2 (two) times daily.    [provider]  amiodarone (PACERONE) 200 MG tablet TAKE ONE TABLET BY MOUTH ONCE DAILY 10/29/16   Hilty, Lisette AbuKenneth C, MD  bumetanide (BUMEX) 1 MG tablet Take 1 tablet (1 mg total) by mouth 2 (two) times daily. 10/15/16   Hilty, Lisette AbuKenneth C, MD  cetirizine (ZYRTEC) 10 MG tablet Take 10 mg by mouth daily as needed for  allergies.    [provider]  docusate sodium 100 MG CAPS Take 100 mg by mouth 2 (two) times daily. 08/15/13   Lanney GinsBabish, Matthew, [name]  famotidine (PEPCID) 20 MG tablet Take 1 tablet (20 mg total) by mouth 2 (two) times daily. 02/21/17   Tyrone NineGrunz, Ryan B, MD  ferrous sulfate (FERROUSUL) 325 (65 FE) MG tablet Take 1 tablet (325 mg total) by mouth 3 (three) times daily with meals. 01/24/17   Lanney GinsBabish, Matthew, [name]  gabapentin (NEURONTIN) 400 MG capsule TAKE 1 CAPSULE BY MOUTH THREE TIMES DAILY 01/14/17   Gordy SaversKwiatkowski, Peter F, MD  KLOR-CON M20 20 MEQ tablet TAKE ONE TABLET BY MOUTH ONCE DAILY 11/12/16   Gordy SaversKwiatkowski, Peter F, MD  Magnesium 250 MG TABS Take 250 mg by mouth daily.     [provider]  Melatonin 3 MG CAPS Take 3 mg by mouth as needed (insomnia).    [provider]  midodrine (PROAMATINE) 10 MG tablet Take 1 tablet (10 mg  total) by mouth 3 (three) times daily. 05/28/17   Tyrone Nine, MD  Multiple Vitamin (MULTIVITAMIN WITH MINERALS) TABS tablet Take 1 tablet by mouth daily.    [provider]  Multiple Vitamins-Minerals (CERTAGEN PO) Take 1 capsule by mouth daily.    [provider]  Multiple Vitamins-Minerals (PRESERVISION AREDS 2) CAPS Take 1 capsule by mouth 2 (two) times daily.    [provider]  nutrition supplement, JUVEN, (JUVEN) PACK Take 1 packet by mouth 2 (two) times daily between meals. 02/21/17   Tyrone Nine, MD  OXYGEN Inhale into the lungs.    [provider]  polyethylene glycol (MIRALAX / GLYCOLAX) packet Take 17 g by mouth 2 (two) times daily. 01/24/17   Lanney Gins, [name]  potassium chloride (KLOR-CON) 20 MEQ packet Take 20 mEq by mouth daily.    [provider]  protein supplement shake (PREMIER PROTEIN) LIQD Take 325 mLs (11 oz total) by mouth daily. 02/21/17   Tyrone Nine, MD  rivaroxaban (XARELTO) 20 MG TABS tablet Take 1 tablet (20 mg total) by mouth daily. 08/05/15   Hilty, Lisette Abu, MD   rosuvastatin (CRESTOR) 20 MG tablet TAKE ONE TABLET BY MOUTH ONCE A WEEK, FRIDAY 12/24/16   Hilty, Lisette Abu, MD  SPIRIVA HANDIHALER 18 MCG inhalation capsule INHALE ONE PUFF BY MOUTH ONCE DAILY Patient taking differently: Place 18 mcg into inhaler and inhale daily.  03/18/16   [name], MD  tamsulosin (FLOMAX) 0.4 MG CAPS capsule TAKE 1 CAPSULE BY MOUTH ONCE DAILY 12/24/16   [name], MD  traMADol (ULTRAM) 50 MG tablet Take 1-2 tablets (50-100 mg total) by mouth every 12 (twelve) hours as needed for moderate pain or severe pain (and before hydrotherapy). 05/28/17   Tyrone Nine, MD  VITAMIN D, CHOLECALCIFEROL, PO Take by mouth.    [provider]    Family History Family History  Problem Relation Age of Onset  . Alzheimer's disease Mother   . Heart disease Father   . Colon cancer Neg Hx   . Esophageal cancer Neg Hx     Social History Social History   Tobacco Use  . Smoking status: Former Games developer  . Smokeless tobacco: Never Used  . Tobacco comment: quit in 1956  Substance Use Topics  . Alcohol use: No  . Drug use: No     Allergies   Nsaids, Vicodin [hydrocodone-acetaminophen], and Morphine and related   Review of Systems Review of Systems  All other systems reviewed and are negative.    Physical Exam Updated Vital Signs BP (!) 104/48   Pulse 68   Temp 99.1 F (37.3 C) (Rectal)   Resp (!) 26   SpO2 97%   Physical Exam Vitals signs and nursing note reviewed.  Constitutional:      General: He is not in acute distress.    Appearance: Normal appearance. He is well-developed. He is not toxic-appearing.  HENT:     Head: Normocephalic and atraumatic.  Eyes:     General: Lids are normal.     Conjunctiva/sclera: Conjunctivae normal.     Pupils: Pupils are equal, round, and reactive to light.  Neck:     Musculoskeletal: Normal range of motion and neck supple.     Thyroid: No thyroid mass.     Trachea: No tracheal deviation.   Cardiovascular:     Rate and Rhythm: Normal rate and regular rhythm.     Heart sounds: Normal heart sounds. No  murmur. No gallop.   Pulmonary:     Effort: Pulmonary effort is normal. No respiratory distress.     Breath sounds: Normal breath sounds. No stridor. No decreased breath sounds, wheezing, rhonchi or rales.  Abdominal:     General: Abdomen is protuberant. Bowel sounds are decreased. There is distension.     Palpations: Abdomen is soft.     Tenderness: There is generalized abdominal tenderness. There is no rebound.  Musculoskeletal: Normal range of motion.        General: No tenderness.  Skin:    General: Skin is warm and dry.     Findings: No abrasion or rash.  Neurological:     Mental Status: He is alert and oriented to person, place, and time.     GCS: GCS eye subscore is 4. GCS verbal subscore is 5. GCS motor subscore is 6.     Cranial Nerves: No cranial nerve deficit.     Sensory: No sensory deficit.  Psychiatric:        Speech: Speech normal.        Behavior: Behavior normal.      ED Treatments / Results  Labs (all labs ordered are listed, but only abnormal results are displayed) Labs Reviewed  LACTIC ACID, PLASMA  CBC WITH DIFFERENTIAL/PLATELET  COMPREHENSIVE METABOLIC PANEL  LIPASE, BLOOD    EKG None  Radiology No results found.  Procedures Procedures (including critical care time)  Medications Ordered in ED Medications  0.9 %  sodium chloride infusion (has no administration in time range)     Initial Impression / Assessment and Plan / ED Course  I have reviewed the triage vital signs and the nursing notes.  Pertinent labs & imaging results that were available during my care of the patient were reviewed by me and considered in my medical decision making (see chart for details).        Patient is urinalysis shows infection.  Lactic acid is negative.  Due to abdominal distention patient abdominal CT which showed a intra-abdominal mass.   Consult with Dr. Jeffie Pollock from urology who recommends patient had percutaneous drainage of this to determine if this is an abscess versus a urinoma.  Will consult hospitalist for admission  Final Clinical Impressions(s) / ED Diagnoses   Final diagnoses:  None    ED Discharge Orders    None       Lacretia Leigh, MD 04/16/2019 2112

## 2019-03-21 NOTE — H&P (Signed)
History and Physical    [name] Christmasverett E Allman ZOX:[id number] DOB: 1927/05/12 DOA: 03/20/2019  PCP: System, Pcp Not In   Patient coming from: SNF   Chief Complaint: Abdominal distension, constipation, watery stools   HPI: [name] is a 85 y.o. male with medical history significant for atrial fibrillation on Xarelto, chronic diastolic CHF, asthma/COPD, OSA on BiPAP, hypotension on midodrine, and chronic sacral ulcer, now presenting to the emergency department for evaluation of progressive abdominal distention with alternating constipation and watery stool.  Patient is accompanied by his daughter who assists with the history.  Patient reportedly developed constipation several days ago, reports some abdominal distention since then, and has had a couple watery stools.  He denies any abdominal pain but reports some mild generalized discomfort.  He denies nausea or vomiting but his daughter notes that he vomited a small amount earlier.  Patient denies any fevers, chills, flank pain, or dysuria.  He denies any change in his chronic back pain.  ED Course: Upon arrival to the ED, patient is found to be afebrile, saturating 89% on room air, and with blood pressure 102/47.  EKG features a sinus rhythm.  Chest x-ray with cardiomegaly and small bilateral pleural effusions with basilar atelectasis.  CT of the abdomen and pelvis is notable for ileus with moderate left hydronephrosis, left retroperitoneal/perinephric fluid collection, left renal cyst, and acute to subacute compression fracture of L4.  Neurology was consulted by the ED physician and recommended consultation with IR for drainage of the fluid collection.  Patient was given IV fluids and a dose of Rocephin, and urine was sent for culture.  COVID-19 testing is in process.  Review of Systems:  All other systems reviewed and apart from HPI, are negative.  Past Medical History:  Diagnosis Date   A-fib (HCC)    Acute systolic CHF (congestive heart  failure), NYHA class 3 -- Unclear etilogy (? Afib related) EF down from 60-70% to 40%. 07/05/2012   Echo 2/21: LV upper limits of normal. EF- 40%. Cannot assess Diastolic function - Aortic Sclerosis, Mod-Severe Left Atrial dilation; Mild RV & RA dilation; Moderately elevated PA peak pressure: 43mm Hg      Arthritis    knees   Asthma    ASTHMA 02/06/2007   Qualifier: Diagnosis of  By: Claiborne Billingsallahan CMA, Jacqualynn     BENIGN PROSTATIC HYPERTROPHY 05/13/2010   Qualifier: Diagnosis of  By: Amador CunasKwiatkowski  MD, Janett LabellaPeter F    Bifascicular block 12/18/2013   BPH (benign prostatic hypertrophy)    Bronchospasm, exercise-induced    CHF (congestive heart failure) (HCC)    systolic, EF 40% (07/07/2012)   Chronic kidney disease    stabilized, due to infection   Chronic rhinitis 06/02/2009   Qualifier: Diagnosis of  By: Lovell SheehanJenkins MD, John E    DJD (degenerative joint disease), lumbar 01/18/2012   Dyslipidemia    Edema    Bilateral - left lower leg greater than right- wears compression stockings   General weakness 07/05/2012   GERD (gastroesophageal reflux disease)    History of elevated glucose 10/01/2009   Qualifier: Diagnosis of  By: Lovell SheehanJenkins MD, John E    Hx of colonic polyps 11/02/2011   Hypertension    INSOMNIA, CHRONIC, MILD 01/02/2008   Qualifier: Diagnosis of  By: Lovell SheehanJenkins MD, Balinda QuailsJohn E    Macular degeneration 08-06-13   bilateral -sight impaired   Obesity    Paroxysmal Atrial fibrillation - admitted with RVR 01/18/2012   S/P left THA, AA 08/13/2013  Shortness of breath     Past Surgical History:  Procedure Laterality Date   CARDIOVERSION N/A 07/07/2012   Procedure: CARDIOVERSION;  Surgeon: Chrystie Nose, MD;  Location: [facility] ENDOSCOPY;  Service: Cardiovascular;  Laterality: N/A;   CARDIOVERSION N/A 08/30/2012   Procedure: CARDIOVERSION;  Surgeon: Chrystie Nose, MD;  Location: [facility] ENDOSCOPY;  Service: Cardiovascular;  Laterality: N/A;   IR FLUORO GUIDE CV LINE RIGHT  04/15/2017   IR  US GUIDE VASC ACCESS RIGHT  04/15/2017   IRRIGATION AND DEBRIDEMENT ABSCESS N/A 02/08/2017   Procedure: IRRIGATION AND DEBRIDEMENT SACRAL ULCER;  Surgeon: Berna Bue, MD;  Location: [facility] ORS;  Service: General;  Laterality: N/A;   TOTAL HIP ARTHROPLASTY Left 08/13/2013   Procedure: LEFT TOTAL HIP ARTHROPLASTY ANTERIOR APPROACH;  Surgeon: [name], MD;  Location: [facility] ORS;  Service: Orthopedics;  Laterality: Left;   TOTAL KNEE ARTHROPLASTY Left 01/24/2017   Procedure: LEFT TOTAL KNEE ARTHROPLASTY;  Surgeon: Durene Romans, MD;  Location: [facility] ORS;  Service: Orthopedics;  Laterality: Left;  90 mins   TRANSTHORACIC ECHOCARDIOGRAM  07/07/2012   ef 40%; mild MR; LA mod-severely dilated; RA mildly dilated; RV systolic function mildly reduced; RV systolic pressure increase - mod pulm htn   VASCULAR SURGERY  removed varicose veins   VASECTOMY     viritual colonoscopy  04/2017   Dr Marca Ancona     reports that he has quit smoking. He has never used smokeless tobacco. He reports that he does not drink alcohol or use drugs.  Allergies  Allergen Reactions   Nsaids     Stomach pain   Vicodin [Hydrocodone-Acetaminophen] Other (See Comments)    Unknown reaction many years ago   Morphine And Related Nausea And Vomiting    Dizziness, light headed. "Opiates cause tightness in chest".    Family History  Problem Relation Age of Onset   Alzheimer's disease Mother    Heart disease Father    Colon cancer Neg Hx    Esophageal cancer Neg Hx      Prior to Admission medications   Medication Sig Start Date End Date Taking? Authorizing Provider  acetaminophen (TYLENOL) 500 MG tablet Take 2 tablets (1,000 mg total) by mouth every 8 (eight) hours. 01/24/17   Lanney Gins, [name]  albuterol (PROVENTIL HFA;VENTOLIN HFA) 108 (90 Base) MCG/ACT inhaler Inhale 2 puffs into the lungs. Every 6 hours as needed for wheezing or shortness of breath    [provider]  Amino Acids-Protein Hydrolys  (FEEDING SUPPLEMENT, PRO-STAT SUGAR FREE 64,) LIQD Take 30 mLs by mouth 2 (two) times daily.    [provider]  amiodarone (PACERONE) 200 MG tablet TAKE ONE TABLET BY MOUTH ONCE DAILY 10/29/16   Hilty, Lisette Abu, MD  bumetanide (BUMEX) 1 MG tablet Take 1 tablet (1 mg total) by mouth 2 (two) times daily. 10/15/16   Hilty, Lisette Abu, MD  cetirizine (ZYRTEC) 10 MG tablet Take 10 mg by mouth daily as needed for allergies.    [provider]  docusate sodium 100 MG CAPS Take 100 mg by mouth 2 (two) times daily. 08/15/13   Lanney Gins, [name]  famotidine (PEPCID) 20 MG tablet Take 1 tablet (20 mg total) by mouth 2 (two) times daily. 02/21/17   Tyrone Nine, MD  ferrous sulfate (FERROUSUL) 325 (65 FE) MG tablet Take 1 tablet (325 mg total) by mouth 3 (three) times daily with meals. 01/24/17   Lanney Gins, [name]  gabapentin (NEURONTIN) 400 MG capsule TAKE 1  CAPSULE BY MOUTH THREE TIMES DAILY 01/14/17   [name], MD  KLOR-CON M20 20 MEQ tablet TAKE ONE TABLET BY MOUTH ONCE DAILY 11/12/16   [name], MD  Magnesium 250 MG TABS Take 250 mg by mouth daily.     [provider]  Melatonin 3 MG CAPS Take 3 mg by mouth as needed (insomnia).    [provider]  midodrine (PROAMATINE) 10 MG tablet Take 1 tablet (10 mg total) by mouth 3 (three) times daily. 05/28/17   Tyrone Nine, MD  Multiple Vitamin (MULTIVITAMIN WITH MINERALS) TABS tablet Take 1 tablet by mouth daily.    [provider]  Multiple Vitamins-Minerals (CERTAGEN PO) Take 1 capsule by mouth daily.    [provider]  Multiple Vitamins-Minerals (PRESERVISION AREDS 2) CAPS Take 1 capsule by mouth 2 (two) times daily.    [provider]  nutrition supplement, JUVEN, (JUVEN) PACK Take 1 packet by mouth 2 (two) times daily between meals. 02/21/17   Tyrone Nine, MD  OXYGEN Inhale into the lungs.    [provider]  polyethylene glycol (MIRALAX / GLYCOLAX) packet Take  17 g by mouth 2 (two) times daily. 01/24/17   Lanney Gins, [name]  potassium chloride (KLOR-CON) 20 MEQ packet Take 20 mEq by mouth daily.    [provider]  protein supplement shake (PREMIER PROTEIN) LIQD Take 325 mLs (11 oz total) by mouth daily. 02/21/17   Tyrone Nine, MD  rivaroxaban (XARELTO) 20 MG TABS tablet Take 1 tablet (20 mg total) by mouth daily. 08/05/15   Hilty, Lisette Abu, MD  rosuvastatin (CRESTOR) 20 MG tablet TAKE ONE TABLET BY MOUTH ONCE A WEEK, FRIDAY 12/24/16   Hilty, Lisette Abu, MD  SPIRIVA HANDIHALER 18 MCG inhalation capsule INHALE ONE PUFF BY MOUTH ONCE DAILY Patient taking differently: Place 18 mcg into inhaler and inhale daily.  03/18/16   [name], MD  tamsulosin (FLOMAX) 0.4 MG CAPS capsule TAKE 1 CAPSULE BY MOUTH ONCE DAILY 12/24/16   [name], MD  traMADol (ULTRAM) 50 MG tablet Take 1-2 tablets (50-100 mg total) by mouth every 12 (twelve) hours as needed for moderate pain or severe pain (and before hydrotherapy). 05/28/17   Tyrone Nine, MD  VITAMIN D, CHOLECALCIFEROL, PO Take by mouth.    [provider]    Physical Exam: Vitals:   04/12/2019 1600 03/26/2019 1615 04/05/2019 2100 04/05/2019 2200  BP: (!) 104/48 (!) 104/48 (!) 105/43 (!) 105/43  Pulse: 68 69 66 68  Resp: (!) 26 (!) 23 (!) 25 (!) 22  Temp:      TempSrc:      SpO2: 97% 98% 99% 99%    Constitutional: NAD, calm  Eyes: PERTLA, lids and conjunctivae normal ENMT: Mucous membranes are moist. Posterior pharynx clear of any exudate or lesions.   Neck: normal, supple, no masses, no thyromegaly Respiratory:  no wheezing, no crackles. Normal respiratory effort. No accessory muscle use.  Cardiovascular: S1 & S2 heard, regular rate and rhythm. Mild bilateral leg swelling. Abdomen: Soft, mild generalized tenderness, no rebound pain or guarding. Bowel sounds active.   Musculoskeletal: no clubbing / cyanosis. No joint deformity upper and lower extremities.    Skin: Chronic  sacral wound. Warm, dry, well-perfused. Neurologic: No gross facial asymmetry. Gross hearing deficit. Sensation intact. Moving all extremities.  Psychiatric: Normal judgment and insight. Alert and oriented to person, place, and situation. Pleasant, cooperative.     Labs on Admission:  I have personally reviewed following labs and imaging studies  CBC: Recent Labs  Lab 03/31/2019 1749  WBC 10.6*  NEUTROABS 7.4  HGB 10.9*  HCT 34.3*  MCV 99.1  PLT 279   Basic Metabolic Panel: Recent Labs  Lab 03/22/2019 1749  NA 142  K 2.9*  CL 94*  CO2 37*  GLUCOSE 111*  BUN 22  CREATININE 0.74  CALCIUM 8.1*   GFR: CrCl cannot be calculated (Unknown ideal weight.). Liver Function Tests: Recent Labs  Lab 04/04/2019 1749  AST 19  ALT 15  ALKPHOS 64  BILITOT 0.4  PROT 5.6*  ALBUMIN 2.0*   Recent Labs  Lab 03/29/2019 1749  LIPASE 20   No results for input(s): AMMONIA in the last 168 hours. Coagulation Profile: No results for input(s): INR, PROTIME in the last 168 hours. Cardiac Enzymes: No results for input(s): CKTOTAL, CKMB, CKMBINDEX, TROPONINI in the last 168 hours. BNP (last 3 results) No results for input(s): PROBNP in the last 8760 hours. HbA1C: No results for input(s): HGBA1C in the last 72 hours. CBG: No results for input(s): GLUCAP in the last 168 hours. Lipid Profile: No results for input(s): CHOL, HDL, LDLCALC, TRIG, CHOLHDL, LDLDIRECT in the last 72 hours. Thyroid Function Tests: No results for input(s): TSH, T4TOTAL, FREET4, T3FREE, THYROIDAB in the last 72 hours. Anemia Panel: No results for input(s): VITAMINB12, FOLATE, FERRITIN, TIBC, IRON, RETICCTPCT in the last 72 hours. Urine analysis:    Component Value Date/Time   COLORURINE YELLOW 04/16/2019 1943   APPEARANCEUR CLEAR 04/03/2019 1943   LABSPEC 1.011 04/09/2019 1943   PHURINE 6.0 04/07/2019 1943   GLUCOSEU NEGATIVE 04/09/2019 1943   HGBUR MODERATE (A) 03/27/2019 1943   BILIRUBINUR NEGATIVE 04/08/2019  1943   KETONESUR NEGATIVE 03/28/2019 1943   PROTEINUR NEGATIVE 04/09/2019 1943   UROBILINOGEN 0.2 08/06/2013 1006   NITRITE NEGATIVE 03/30/2019 1943   LEUKOCYTESUR MODERATE (A) 04/14/2019 1943   Sepsis Labs: @LABRCNTIP (procalcitonin:4,lacticidven:4) )No results found for this or any previous visit (from the past 240 hour(s)).   Radiological Exams on Admission: Ct Abdomen Pelvis W Contrast  Result Date: 03/30/2019 CLINICAL DATA:  Abdomen distension EXAM: CT ABDOMEN AND PELVIS WITH CONTRAST TECHNIQUE: Multidetector CT imaging of the abdomen and pelvis was performed using the standard protocol following bolus administration of intravenous contrast. CONTRAST:  100mL OMNIPAQUE IOHEXOL 300 MG/ML  SOLN COMPARISON:  04/07/2019, 01/19/2012, May 09, 2017 FINDINGS: Lower chest: Lung bases demonstrate small pleural effusions. Partial atelectasis within the posterior lower lobes. Cardiomegaly. Hepatobiliary: Subcentimeter hypodensities in the liver too small to further characterize. Small stones in the gallbladder. No biliary dilatation Pancreas: Unremarkable. No pancreatic ductal dilatation or surrounding inflammatory changes. Spleen: Normal in size without focal abnormality. Adrenals/Urinary Tract: Adrenal glands are normal. Complex exophytic cyst mid left kidney measuring 3.7 cm. Multiple stones within the left renal collecting system, measuring up to 13 mm in size. Subcentimeter hypodensities in the right kidney too small to further characterize. No right hydronephrosis. Moderate hydronephrosis of the left kidney. Urothelial enhancement of the left renal pelvis. The left ureter is difficult to follow. No definitive ureteral stones. Irregular mildly rim enhancing left retroperitoneal fluid collection, extending from left renal hilus to the left upper pelvis. By 8.3 cm oblique craniocaudad. Asymmetric wall thickening of the bladder. Possible small stones within the right dependent bladder. Stomach/Bowel: The  stomach is nonenlarged. No significant small bowel distension. Mild diffuse gaseous enlargement of the left colon without definitive transition point to suggest obstruction. No colon wall thickening. Vascular/Lymphatic:  Extensive aortic atherosclerosis. No aneurysm. Subcentimeter retroperitoneal nodes. Reproductive: Prostate is unremarkable. Other: No free air Musculoskeletal: Suspected transitional anatomy. For the purposes of reporting, transitional segment will be lumbarized S1. Acute to subacute marked compression fracture of L4 involving both the anterior and middle columns. No fracture lucency at the posterior neural arch. About 40% loss of height of the vertebral body. Midline sacral decubitus ulcer with bony remodeling/chronic erosion of the sacrococcygeal region IMPRESSION: 1. No significant small bowel distension. Mild gaseous dilatation, predominantly of the distal colon, without well-defined transition to suggest obstruction. Findings are suggestive of an ileus. 2. Moderate left hydronephrosis. Course of left ureter difficult to follow, secondary to an irregular mildly rim enhancing left retroperitoneal/perinephric mildly complex fluid collection that extends from left renal hilus to the upper pelvis. Possible small stones within the dependent right bladder. Findings could be secondary to passed kidney stone, complicated by forniceal rupture and organizing urinoma. Infected fluid collection/retroperitoneal abscess is also a consideration. There are multiple additional stones in the left kidney. The bladder demonstrates asymmetric wall thickening 3. 3.7 cm complex left renal cyst, suggest nonemergent MRI evaluation. 4. Small bilateral pleural effusions and cardiomegaly 5. Gallstones 6. Suspected transitional anatomy of the spine as detailed above. Suspected acute to subacute moderate severe compression fracture L4 involving the anterior and middle columns. Electronically Signed   By: Donavan Foil M.D.    On: Mar 27, 2019 19:59   [place]  Result Date: 03/27/19 CLINICAL DATA:  Abdomen distension EXAM: DG ABDOMEN ACUTE W/ 1V CHEST COMPARISON:  05/28/2017, 05/27/2017 FINDINGS: Single-view chest demonstrate small pleural effusions and basilar atelectasis. Mild cardiomegaly with aortic atherosclerosis. Prominent central pulmonary vessels, possible arterial hypertension. Supine and upright views of the abdomen demonstrate no free air beneath the diaphragm. Mild to moderate diffuse gaseous enlargement of small and large bowel with scattered rectal gas. Status post left hip replacement. No radiopaque calculi. IMPRESSION: 1. Cardiomegaly with small pleural effusions and basilar atelectasis 2. Mild to moderate diffuse increased small and large bowel gas suggestive of an ileus. Electronically Signed   By: Donavan Foil M.D.   On: 2019-03-27 16:49    EKG: Independently reviewed. Sinus rhythm, RBBB, LAFB.   Assessment/Plan   1. Perinephric fluid collection  - Presents with several days of abdominal distension, alternating constipation and watery stool, and is found to have retroperitoneal/perinephric fluid collection with DDx including urinoma or abscess  - Urology was consulted by ED physician and recommended IR drainage  - Continue bowel rest, culture if febrile, hold Xarelto, consult IR for drainage, sent fluid for cell counts, culture, and creatinine level, continue supportive care    2. Ileus   - Likely secondary to #1  - Continue bowel rest, supportive care    3. Chronic diastolic CHF  - Appears compensated  - Hold diuretic while NPO, SLIV for now, follow daily wts   4. Atrial fibrillation  - In sinus rhythm on admission  - CHADS-VASc at least 3 (age x2, CHF)  - Xarelto held on admission in anticipation of possible procedure, consider starting IV heparin tomorrow if he will need to remain of Xarelto for surgery    5. Hypokalemia  - Serum potassium is 2.9 on admission  - Replace with oral and IV  potassium, empiric mag, and repeat chem panel in am    6. L4 compression fracture  - Noted on CT, appears to be acute or subacute  - patient denies any recent fall or trauma, denies change in his chronic back  pain, and denies new weakness or numbness  - Continue supportive care   7. Left renal cyst  - Noted on CT in ED with non-emergent MRI recommended    8. COPD  - No cough or wheezing on admission, uses 1-2 Lpm as needed at SNF and uses BiPAP qHS  - Continue Spiriva, as-needed albuterol, as-needed supplemental O2, and BiPAP qHS   9. Sacral pressure ulcer  - Patient has hx of chronic sacral ulcer and underlying osteomyelitis  - No evidence for acute infection, appears to be healing, continue wound care    PPE: Mask, face shield  DVT prophylaxis: Xarelto pta, held on admission  Code Status: Partial - no intubation. Patient expresses desire for attempt at CPR, ACLS medications, and defibrillation if needed but would not want to be intubated or on ventilator.  Family Communication: Daughter updated at bedside Consults called: Urology consulted by ED physician  Admission status: Observation     Briscoe Deutscher, MD [facility] Pager [phone]  If 7PM-7AM, please contact night-coverage [email or web address] Password [email or web address]  04/17/2019, 10:15 PM

## 2019-03-21 NOTE — ED Triage Notes (Signed)
To room via EMS.  Onset 3-4 days abd distention and several small stools.  Last normal BM 3-4 days ago.   Abd tender to touch.  EMS BP 80/60, pt was given 359ml IVF, BP increased 100/50, CBG 153  Temp 99.5 EKG Afib.   Pt on 2L oxygen via [place].

## 2019-03-22 ENCOUNTER — Encounter (HOSPITAL_COMMUNITY): Payer: Self-pay | Admitting: Diagnostic Radiology

## 2019-03-22 ENCOUNTER — Ambulatory Visit (HOSPITAL_COMMUNITY)
Admit: 2019-03-22 | Discharge: 2019-03-22 | Disposition: A | Discharge status: Home / Self Care | Payer: Medicare Other | Attending: Family Medicine | Admitting: Family Medicine

## 2019-03-22 ENCOUNTER — Observation Stay (HOSPITAL_COMMUNITY): Payer: Medicare Other

## 2019-03-22 DIAGNOSIS — J90 Pleural effusion, not elsewhere classified: Secondary | ICD-10-CM | POA: Diagnosis present

## 2019-03-22 DIAGNOSIS — J9622 Acute and chronic respiratory failure with hypercapnia: Secondary | ICD-10-CM | POA: Diagnosis not present

## 2019-03-22 DIAGNOSIS — J449 Chronic obstructive pulmonary disease, unspecified: Secondary | ICD-10-CM | POA: Diagnosis present

## 2019-03-22 DIAGNOSIS — E662 Morbid (severe) obesity with alveolar hypoventilation: Secondary | ICD-10-CM | POA: Diagnosis present

## 2019-03-22 DIAGNOSIS — Z993 Dependence on wheelchair: Secondary | ICD-10-CM | POA: Diagnosis not present

## 2019-03-22 DIAGNOSIS — M4856XA Collapsed vertebra, not elsewhere classified, lumbar region, initial encounter for fracture: Secondary | ICD-10-CM | POA: Diagnosis present

## 2019-03-22 DIAGNOSIS — N138 Other obstructive and reflux uropathy: Secondary | ICD-10-CM | POA: Diagnosis present

## 2019-03-22 DIAGNOSIS — Z66 Do not resuscitate: Secondary | ICD-10-CM | POA: Diagnosis not present

## 2019-03-22 DIAGNOSIS — K567 Ileus, unspecified: Secondary | ICD-10-CM | POA: Diagnosis present

## 2019-03-22 DIAGNOSIS — Z7901 Long term (current) use of anticoagulants: Secondary | ICD-10-CM | POA: Diagnosis not present

## 2019-03-22 DIAGNOSIS — Z7189 Other specified counseling: Secondary | ICD-10-CM | POA: Diagnosis not present

## 2019-03-22 DIAGNOSIS — L89159 Pressure ulcer of sacral region, unspecified stage: Secondary | ICD-10-CM | POA: Diagnosis not present

## 2019-03-22 DIAGNOSIS — I428 Other cardiomyopathies: Secondary | ICD-10-CM | POA: Diagnosis present

## 2019-03-22 DIAGNOSIS — J9621 Acute and chronic respiratory failure with hypoxia: Secondary | ICD-10-CM | POA: Diagnosis not present

## 2019-03-22 DIAGNOSIS — I452 Bifascicular block: Secondary | ICD-10-CM | POA: Diagnosis present

## 2019-03-22 DIAGNOSIS — X58XXXA Exposure to other specified factors, initial encounter: Secondary | ICD-10-CM | POA: Diagnosis present

## 2019-03-22 DIAGNOSIS — R0602 Shortness of breath: Secondary | ICD-10-CM | POA: Diagnosis not present

## 2019-03-22 DIAGNOSIS — I5042 Chronic combined systolic (congestive) and diastolic (congestive) heart failure: Secondary | ICD-10-CM | POA: Diagnosis present

## 2019-03-22 DIAGNOSIS — Z20828 Contact with and (suspected) exposure to other viral communicable diseases: Secondary | ICD-10-CM | POA: Diagnosis present

## 2019-03-22 DIAGNOSIS — I5032 Chronic diastolic (congestive) heart failure: Secondary | ICD-10-CM | POA: Diagnosis not present

## 2019-03-22 DIAGNOSIS — G92 Toxic encephalopathy: Secondary | ICD-10-CM | POA: Diagnosis not present

## 2019-03-22 DIAGNOSIS — I4891 Unspecified atrial fibrillation: Secondary | ICD-10-CM | POA: Diagnosis not present

## 2019-03-22 DIAGNOSIS — N2889 Other specified disorders of kidney and ureter: Secondary | ICD-10-CM | POA: Diagnosis present

## 2019-03-22 DIAGNOSIS — I4821 Permanent atrial fibrillation: Secondary | ICD-10-CM | POA: Diagnosis present

## 2019-03-22 DIAGNOSIS — I130 Hypertensive heart and chronic kidney disease with heart failure and stage 1 through stage 4 chronic kidney disease, or unspecified chronic kidney disease: Secondary | ICD-10-CM | POA: Diagnosis present

## 2019-03-22 DIAGNOSIS — N151 Renal and perinephric abscess: Secondary | ICD-10-CM | POA: Diagnosis present

## 2019-03-22 DIAGNOSIS — L89154 Pressure ulcer of sacral region, stage 4: Secondary | ICD-10-CM | POA: Diagnosis present

## 2019-03-22 DIAGNOSIS — J690 Pneumonitis due to inhalation of food and vomit: Secondary | ICD-10-CM | POA: Diagnosis not present

## 2019-03-22 DIAGNOSIS — I9589 Other hypotension: Secondary | ICD-10-CM | POA: Diagnosis present

## 2019-03-22 DIAGNOSIS — D689 Coagulation defect, unspecified: Secondary | ICD-10-CM | POA: Diagnosis not present

## 2019-03-22 DIAGNOSIS — Z515 Encounter for palliative care: Secondary | ICD-10-CM | POA: Diagnosis not present

## 2019-03-22 DIAGNOSIS — I480 Paroxysmal atrial fibrillation: Secondary | ICD-10-CM | POA: Diagnosis not present

## 2019-03-22 DIAGNOSIS — R1310 Dysphagia, unspecified: Secondary | ICD-10-CM | POA: Diagnosis not present

## 2019-03-22 DIAGNOSIS — Z9889 Other specified postprocedural states: Secondary | ICD-10-CM | POA: Diagnosis not present

## 2019-03-22 HISTORY — PX: IR NEPHROSTOMY PLACEMENT LEFT: IMG6063

## 2019-03-22 LAB — CBC WITH DIFFERENTIAL/PLATELET
Abs Immature Granulocytes: 0.06 10*3/uL (ref 0.00–0.07)
Basophils Absolute: 0 10*3/uL (ref 0.0–0.1)
Basophils Relative: 0 %
Eosinophils Absolute: 0.2 10*3/uL (ref 0.0–0.5)
Eosinophils Relative: 1 %
HCT: 33.3 % — ABNORMAL LOW (ref 39.0–52.0)
Hemoglobin: 10.5 g/dL — ABNORMAL LOW (ref 13.0–17.0)
Immature Granulocytes: 1 %
Lymphocytes Relative: 11 %
Lymphs Abs: 1.3 10*3/uL (ref 0.7–4.0)
MCH: 31.5 pg (ref 26.0–34.0)
MCHC: 31.5 g/dL (ref 30.0–36.0)
MCV: 100 fL (ref 80.0–100.0)
Monocytes Absolute: 1.2 10*3/uL — ABNORMAL HIGH (ref 0.1–1.0)
Monocytes Relative: 11 %
Neutro Abs: 8.7 10*3/uL — ABNORMAL HIGH (ref 1.7–7.7)
Neutrophils Relative %: 76 %
Platelets: 302 10*3/uL (ref 150–400)
RBC: 3.33 MIL/uL — ABNORMAL LOW (ref 4.22–5.81)
RDW: 14.2 % (ref 11.5–15.5)
WBC: 11.4 10*3/uL — ABNORMAL HIGH (ref 4.0–10.5)
nRBC: 0 % (ref 0.0–0.2)

## 2019-03-22 LAB — URINE CULTURE: Culture: <10000 — AB

## 2019-03-22 LAB — BASIC METABOLIC PANEL
Anion gap: 7 (ref 5–15)
BUN: 14 mg/dL (ref 8–23)
CO2: 37 mmol/L — ABNORMAL HIGH (ref 22–32)
Calcium: 8.2 mg/dL — ABNORMAL LOW (ref 8.9–10.3)
Chloride: 97 mmol/L — ABNORMAL LOW (ref 98–111)
Creatinine, Ser: 0.62 mg/dL (ref 0.61–1.24)
GFR calc Af Amer: >60 mL/min (ref >60)
GFR calc non Af Amer: >60 mL/min (ref >60)
Glucose, Bld: 106 mg/dL — ABNORMAL HIGH (ref 70–99)
Potassium: 3.4 mmol/L — ABNORMAL LOW (ref 3.5–5.1)
Sodium: 141 mmol/L (ref 135–145)

## 2019-03-22 LAB — MRSA PCR SCREENING: MRSA by PCR: NEGATIVE

## 2019-03-22 LAB — PROTIME-INR
INR: 1.1 (ref 0.8–1.2)
Prothrombin Time: 14.3 seconds (ref 11.4–15.2)

## 2019-03-22 LAB — MAGNESIUM: Magnesium: 2.1 mg/dL (ref 1.7–2.4)

## 2019-03-22 MED ORDER — CEFAZOLIN SODIUM-DEXTROSE 2-4 GM/100ML-% IV SOLN
INTRAVENOUS | Status: AC
  Filled 2019-03-22: qty 100

## 2019-03-22 MED ORDER — AMIODARONE HCL 200 MG PO TABS
200.0000 mg | ORAL_TABLET | Freq: Every day | ORAL | Status: DC
  Administered 2019-03-22 – 2019-04-11 (×21): 200 mg via ORAL
  Filled 2019-03-22 (×22): qty 1

## 2019-03-22 MED ORDER — FAMOTIDINE 20 MG PO TABS
20.0000 mg | ORAL_TABLET | Freq: Two times a day (BID) | ORAL | Status: DC
  Administered 2019-03-22 – 2019-04-11 (×38): 20 mg via ORAL
  Filled 2019-03-22 (×39): qty 1

## 2019-03-22 MED ORDER — MELATONIN 3 MG PO TABS
3.0000 mg | ORAL_TABLET | Freq: Every evening | ORAL | Status: DC | PRN
  Administered 2019-04-02 – 2019-04-05 (×4): 3 mg via ORAL
  Filled 2019-03-22 (×8): qty 1

## 2019-03-22 MED ORDER — SODIUM CHLORIDE 0.9% FLUSH
5.0000 mL | Freq: Three times a day (TID) | INTRAVENOUS | Status: DC
  Administered 2019-03-23 – 2019-03-28 (×11): 5 mL

## 2019-03-22 MED ORDER — LIDOCAINE HCL 1 % IJ SOLN
INTRAMUSCULAR | Status: AC
  Filled 2019-03-22: qty 20

## 2019-03-22 MED ORDER — FENTANYL CITRATE (PF) 100 MCG/2ML IJ SOLN
INTRAMUSCULAR | Status: AC | PRN
  Administered 2019-03-22 (×2): 25 ug via INTRAVENOUS

## 2019-03-22 MED ORDER — MIDODRINE HCL 5 MG PO TABS
10.0000 mg | ORAL_TABLET | Freq: Three times a day (TID) | ORAL | Status: DC
  Administered 2019-03-22 – 2019-04-11 (×57): 10 mg via ORAL
  Filled 2019-03-22 (×64): qty 2

## 2019-03-22 MED ORDER — CIPROFLOXACIN IN D5W 400 MG/200ML IV SOLN
INTRAVENOUS | Status: AC
  Administered 2019-03-22: 400 mg via INTRAVENOUS
  Filled 2019-03-22: qty 200

## 2019-03-22 MED ORDER — CIPROFLOXACIN IN D5W 400 MG/200ML IV SOLN
400.0000 mg | Freq: Once | INTRAVENOUS | Status: AC
  Administered 2019-03-22: 10:00:00 400 mg via INTRAVENOUS

## 2019-03-22 MED ORDER — POTASSIUM CHLORIDE CRYS ER 20 MEQ PO TBCR
40.0000 meq | EXTENDED_RELEASE_TABLET | Freq: Once | ORAL | Status: AC
  Administered 2019-03-22: 19:00:00 40 meq via ORAL
  Filled 2019-03-22: qty 2

## 2019-03-22 MED ORDER — IOHEXOL 300 MG/ML  SOLN
50.0000 mL | Freq: Once | INTRAMUSCULAR | Status: AC | PRN
  Administered 2019-03-22: 10 mL

## 2019-03-22 MED ORDER — MIDAZOLAM HCL 2 MG/2ML IJ SOLN
INTRAMUSCULAR | Status: AC
  Filled 2019-03-22: qty 2

## 2019-03-22 MED ORDER — SODIUM CHLORIDE 0.9 % IV SOLN
2.0000 g | INTRAVENOUS | Status: DC
  Administered 2019-03-22 – 2019-04-12 (×22): 2 g via INTRAVENOUS
  Filled 2019-03-22: qty 2
  Filled 2019-03-22 (×3): qty 20
  Filled 2019-03-22 (×2): qty 2
  Filled 2019-03-22: qty 20
  Filled 2019-03-22 (×6): qty 2
  Filled 2019-03-22 (×3): qty 20
  Filled 2019-03-22: qty 2
  Filled 2019-03-22: qty 20
  Filled 2019-03-22 (×6): qty 2

## 2019-03-22 MED ORDER — GABAPENTIN 400 MG PO CAPS
400.0000 mg | ORAL_CAPSULE | Freq: Three times a day (TID) | ORAL | Status: DC
  Administered 2019-03-22 – 2019-04-11 (×57): 400 mg via ORAL
  Filled 2019-03-22 (×58): qty 1

## 2019-03-22 MED ORDER — FENTANYL CITRATE (PF) 100 MCG/2ML IJ SOLN
INTRAMUSCULAR | Status: AC
  Filled 2019-03-22: qty 2

## 2019-03-22 NOTE — Progress Notes (Signed)
Progress Note    [name]  JOA:[id number] DOB: August 04, 1926  DOA: 04/03/2019 PCP: System, Pcp Not In    Brief Narrative:     Medical records reviewed and are as summarized below:  [name] is an 85 y.o. male with medical history significant for atrial fibrillation on Xarelto, chronic diastolic CHF, asthma/COPD, OSA on BiPAP, hypotension on midodrine, and chronic sacral ulcer, now presenting to the emergency department for evaluation of progressive abdominal distention with alternating constipation and watery stool.  Patient is accompanied by his daughter who assists with the history.  Patient reportedly developed constipation several days ago, reports some abdominal distention since then, and has had a couple watery stools.  He denies any abdominal pain but reports some mild generalized discomfort.  He denies nausea or vomiting but his daughter notes that he vomited a small amount earlier.  Patient denies any fevers, chills, flank pain, or dysuria.  He denies any change in his chronic back pain.  Assessment/Plan:   Principal Problem:   Perinephric fluid collection Active Problems:   Asthma   Paroxysmal Atrial fibrillation - admitted with RVR   Chronic diastolic CHF (congestive heart failure) (HCC)   AF (paroxysmal atrial fibrillation) (HCC)   Hypokalemia   Ileus (HCC)   Closed compression fracture of L4 vertebra (HCC)   Sacral pressure ulcer   Renal cyst, left   1. Perinephric fluid collection  - Presents with several days of abdominal distension, alternating constipation and watery stool, and is found to have retroperitoneal/perinephric fluid collection with DDx including urinoma or abscess  - Urology was consulted by ED physician and recommended IR drainage  - IR: s/p nephrostomy tube -IV abx for now while culture pending  2. Ileus   - had 2 large BMs today -will do trial of food and monitor -back down to clears if N/V  3. Chronic diastolic CHF  - Appears  compensated   4. Atrial fibrillation  - In sinus rhythm on admission  - CHADS-VASc at least 3 (age x2, CHF)  - Xarelto held on admission in anticipation of possible procedure, consider starting IV heparin tomorrow if he will need to remain off of Xarelto for surgery   -will revisit this in the AM once we have a plan from urology  5. Hypokalemia  - replace  6. L4 compression fracture  - Noted on CT, appears to be acute or subacute  - patient denies any recent fall or trauma, denies change in his chronic back pain, and denies new weakness or numbness  - Continue supportive care   7. Left renal cyst  - Noted on CT in ED with non-emergent MRI recommended    8. COPD  - No cough or wheezing on admission, uses 1-2 Lpm as needed at SNF and uses BiPAP qHS  - Continue Spiriva, as-needed albuterol, as-needed supplemental O2, and BiPAP qHS   9. Sacral pressure ulcer - POA - Patient has hx of chronic sacral ulcer and underlying osteomyelitis  - No evidence for acute infection, appears to be healing, continue wound care    Family Communication/Anticipated D/C date and plan/Code Status   DVT prophylaxis:  Code Status: partial  Family Communication: daughter at bedside Disposition Plan:    Medical Consultants:    IR  urology     Subjective:   Wants strawberry Icecream  Objective:    Vitals:   03/22/19 1455 03/22/19 1500 03/22/19 1515 03/22/19 1542  BP: (!) 104/91 (!) 113/53 (!) 110/56 [name])  111/39  Pulse: 78 81 74 71  Resp: (!) 24 (!) 26 (!) 25 20  Temp:    98.8 F (37.1 C)  TempSrc:    Oral  SpO2: 98% 98% 98% 96%  Weight:    94.8 kg  Height:    5\' 10"  (1.778 m)    Intake/Output Summary (Last 24 hours) at 03/22/2019 1635 Last data filed at 03/22/2019 1100 Gross per 24 hour  Intake 289.82 ml  Output 60 ml  Net 229.82 ml   Filed Weights   03/22/19 1542  Weight: 94.8 kg    Exam: In bed, chronically ill appearing rrr Clear +BS, soft, minimally tender 2  drains in place- draining serosanguinous liquid  Data Reviewed:   I have personally reviewed following labs and imaging studies:  Labs: Labs show the following:   Basic Metabolic Panel: Recent Labs  Lab Apr 17, 2019 1749 03/22/19 0450  NA 142 141  K 2.9* 3.4*  CL 94* 97*  CO2 37* 37*  GLUCOSE 111* 106*  BUN 22 14  CREATININE 0.74 0.62  CALCIUM 8.1* 8.2*  MG  --  2.1   GFR Estimated Creatinine Clearance: 72.3 mL/min (by C-G formula based on SCr of 0.62 mg/dL). Liver Function Tests: Recent Labs  Lab 2019-04-17 1749  AST 19  ALT 15  ALKPHOS 64  BILITOT 0.4  PROT 5.6*  ALBUMIN 2.0*   Recent Labs  Lab April 17, 2019 1749  LIPASE 20   No results for input(s): AMMONIA in the last 168 hours. Coagulation profile Recent Labs  Lab 03/22/19 0759  INR 1.1    CBC: Recent Labs  Lab 17-Apr-2019 1749 03/22/19 0450  WBC 10.6* 11.4*  NEUTROABS 7.4 8.7*  HGB 10.9* 10.5*  HCT 34.3* 33.3*  MCV 99.1 100.0  PLT 279 302   Cardiac Enzymes: No results for input(s): CKTOTAL, CKMB, CKMBINDEX, TROPONINI in the last 168 hours. BNP (last 3 results) No results for input(s): PROBNP in the last 8760 hours. CBG: No results for input(s): GLUCAP in the last 168 hours. D-Dimer: No results for input(s): DDIMER in the last 72 hours. Hgb A1c: No results for input(s): HGBA1C in the last 72 hours. Lipid Profile: No results for input(s): CHOL, HDL, LDLCALC, TRIG, CHOLHDL, LDLDIRECT in the last 72 hours. Thyroid function studies: No results for input(s): TSH, T4TOTAL, T3FREE, THYROIDAB in the last 72 hours.  Invalid input(s): FREET3 Anemia work up: No results for input(s): VITAMINB12, FOLATE, FERRITIN, TIBC, IRON, RETICCTPCT in the last 72 hours. Sepsis Labs: Recent Labs  Lab 04-17-19 1749 03/22/19 0450  WBC 10.6* 11.4*  LATICACIDVEN 0.8  --     Microbiology Recent Results (from the past 240 hour(s))  SARS CORONAVIRUS 2 (TAT 6-24 HRS) Nasopharyngeal Nasopharyngeal Swab     Status: None     Collection Time: 04/17/2019  5:54 PM   Specimen: Nasopharyngeal Swab  Result Value Ref Range Status   SARS Coronavirus 2 NEGATIVE NEGATIVE Final    Comment: (NOTE) SARS-CoV-2 target nucleic acids are NOT DETECTED. The SARS-CoV-2 RNA is generally detectable in upper and lower respiratory specimens during the acute phase of infection. Negative results do not preclude SARS-CoV-2 infection, do not rule out co-infections with other pathogens, and should not be used as the sole basis for treatment or other patient management decisions. Negative results must be combined with clinical observations, patient history, and epidemiological information. The expected result is Negative. Fact Sheet for Patients: HairSlick.no Fact Sheet for Healthcare Providers: quierodirigir.com This test is not yet approved or cleared  by the [place] and  has been authorized for detection and/or diagnosis of SARS-CoV-2 by FDA under an Emergency Use Authorization (EUA). This EUA will remain  in effect (meaning this test can be used) for the duration of the COVID-19 declaration under Section 56 4(b)(1) of the Act, 21 U.S.C. section 360bbb-3(b)(1), unless the authorization is terminated or revoked sooner. Performed at [facility], [address], [place], [place] [address]   UCx     Status: Abnormal   Collection Time: 04-11-2019  7:43 PM   Specimen: Urine, Random  Result Value Ref Range Status   Specimen Description URINE, RANDOM  Final   Special Requests NONE  Final   Culture (A)  Final    <10,000 COLONIES/mL INSIGNIFICANT GROWTH Performed at [facility], [address] [place], [place] [address]    Report Status 03/22/2019 FINAL  Final  Aerobic/Anaerobic Culture (surgical/deep wound)     Status: None (Preliminary result)   Collection Time: 03/22/19  3:14 PM   Specimen: Abscess  Result Value Ref Range Status   Specimen Description  ABSCESS  Final   Special Requests LEFT RETROPERITONEAL  Final   Gram Stain   Final    ABUNDANT WBC PRESENT,BOTH PMN AND MONONUCLEAR NO ORGANISMS SEEN Performed at [facility], [address] [place], [place] [address]    Culture PENDING  Incomplete   Report Status PENDING  Incomplete    Procedures and diagnostic studies:  Ct Abdomen Pelvis W Contrast  Result Date: April 11, 2019 CLINICAL DATA:  Abdomen distension EXAM: CT ABDOMEN AND PELVIS WITH CONTRAST TECHNIQUE: Multidetector CT imaging of the abdomen and pelvis was performed using the standard protocol following bolus administration of intravenous contrast. CONTRAST:  OMNIPAQUE IOHEXOL 300 MG/ML  SOLN COMPARISON:  11-Apr-2019, 01/19/2012, May 09, 2017 FINDINGS: Lower chest: Lung bases demonstrate small pleural effusions. Partial atelectasis within the posterior lower lobes. Cardiomegaly. Hepatobiliary: Subcentimeter hypodensities in the liver too small to further characterize. Small stones in the gallbladder. No biliary dilatation Pancreas: Unremarkable. No pancreatic ductal dilatation or surrounding inflammatory changes. Spleen: Normal in size without focal abnormality. Adrenals/Urinary Tract: Adrenal glands are normal. Complex exophytic cyst mid left kidney measuring 3.7 cm. Multiple stones within the left renal collecting system, measuring up to 13 mm in size. Subcentimeter hypodensities in the right kidney too small to further characterize. No right hydronephrosis. Moderate hydronephrosis of the left kidney. Urothelial enhancement of the left renal pelvis. The left ureter is difficult to follow. No definitive ureteral stones. Irregular mildly rim enhancing left retroperitoneal fluid collection, extending from left renal hilus to the left upper pelvis. By 8.3 cm oblique craniocaudad. Asymmetric wall thickening of the bladder. Possible small stones within the right dependent bladder. Stomach/Bowel: The stomach is nonenlarged. No  significant small bowel distension. Mild diffuse gaseous enlargement of the left colon without definitive transition point to suggest obstruction. No colon wall thickening. Vascular/Lymphatic: Extensive aortic atherosclerosis. No aneurysm. Subcentimeter retroperitoneal nodes. Reproductive: Prostate is unremarkable. Other: No free air Musculoskeletal: Suspected transitional anatomy. For the purposes of reporting, transitional segment will be lumbarized S1. Acute to subacute marked compression fracture of L4 involving both the anterior and middle columns. No fracture lucency at the posterior neural arch. About 40% loss of height of the vertebral body. Midline sacral decubitus ulcer with bony remodeling/chronic erosion of the sacrococcygeal region IMPRESSION: 1. No significant small bowel distension. Mild gaseous dilatation, predominantly of the distal colon, without well-defined transition to suggest obstruction. Findings are  suggestive of an ileus. 2. Moderate left hydronephrosis. Course of left ureter difficult to follow, secondary to an irregular mildly rim enhancing left retroperitoneal/perinephric mildly complex fluid collection that extends from left renal hilus to the upper pelvis. Possible small stones within the dependent right bladder. Findings could be secondary to passed kidney stone, complicated by forniceal rupture and organizing urinoma. Infected fluid collection/retroperitoneal abscess is also a consideration. There are multiple additional stones in the left kidney. The bladder demonstrates asymmetric wall thickening 3. 3.7 cm complex left renal cyst, suggest nonemergent MRI evaluation. 4. Small bilateral pleural effusions and cardiomegaly 5. Gallstones 6. Suspected transitional anatomy of the spine as detailed above. Suspected acute to subacute moderate severe compression fracture L4 involving the anterior and middle columns. Electronically Signed   By: Jasmine PangKim  Fujinaga M.D.   On: 20-Sep-2018 19:59    Aas  Result Date: 04/06/2019 CLINICAL DATA:  Abdomen distension EXAM: DG ABDOMEN ACUTE W/ 1V CHEST COMPARISON:  05/28/2017, 05/27/2017 FINDINGS: Single-view chest demonstrate small pleural effusions and basilar atelectasis. Mild cardiomegaly with aortic atherosclerosis. Prominent central pulmonary vessels, possible arterial hypertension. Supine and upright views of the abdomen demonstrate no free air beneath the diaphragm. Mild to moderate diffuse gaseous enlargement of small and large bowel with scattered rectal gas. Status post left hip replacement. No radiopaque calculi. IMPRESSION: 1. Cardiomegaly with small pleural effusions and basilar atelectasis 2. Mild to moderate diffuse increased small and large bowel gas suggestive of an ileus. Electronically Signed   By: Jasmine PangKim  Fujinaga M.D.   On: 20-Sep-2018 16:49   Ir Nephrostomy Placement Left  Result Date: 03/22/2019 INDICATION: 85 year old with left hydronephrosis and fluid collections in the left abdomen along the left ureter. Plan for percutaneous nephrostomy tube placement and drainage of the abdominal fluid collections. EXAM: LEFT PERCUTANEOUS NEPHROSTOMY TUBE PLACEMENT WITH ULTRASOUND AND FLUOROSCOPIC GUIDANCE COMPARISON:  None. MEDICATIONS: Ciprofloxacin 400 mg; The antibiotic was administered in an appropriate time frame prior to skin puncture. ANESTHESIA/SEDATION: Fentanyl 75 mcg IV The patient was continuously monitored during the procedure by the interventional radiology nurse under my direct supervision. CONTRAST:  10 mL Omnipaque 300-administered into the collecting system(s) FLUOROSCOPY TIME:  Fluoroscopy Time: 2 minutes, 6 seconds, 13 mGy COMPLICATIONS: None immediate. PROCEDURE: Informed written consent was obtained from the patient after a thorough discussion of the procedural risks, benefits and alternatives. All questions were addressed. Maximal Sterile Barrier Technique was utilized including caps, mask, sterile gowns, sterile gloves,  sterile drape, hand hygiene and skin antiseptic. A timeout was performed prior to the initiation of the procedure. Patient was placed prone. Ultrasound was used to identify the left kidney. The left flank was prepped and draped in sterile fashion. Skin was anesthetized with 1% lidocaine. Using ultrasound guidance, 21 gauge needle was directed into a mid/upper pole calyx. Wire was advanced into the collecting system. An Accustick dilator set was placed. Tract was dilated to accommodate a 10.2 [place] multipurpose drain. Fluid was aspirated from the left renal collecting system and specimen was sent for culture. Catheter was sutured to skin and attached to a gravity bag. FINDINGS: Moderate left hydronephrosis. Access obtained from a mid/upper pole calyx. Renal collecting system was decompressed at the end of the procedure. IMPRESSION: Successful placement of left percutaneous nephrostomy tube with ultrasound and fluoroscopic guidance. Urine specimen sent for culture. Electronically Signed   By: Richarda OverlieAdam  Henn M.D.   On: 03/22/2019 13:43    Medications:    amiodarone  200 mg Oral Daily   famotidine  20 mg  Oral BID   gabapentin  400 mg Oral TID   lidocaine       lidocaine       midodrine  10 mg Oral TID WC   sodium chloride flush  3 mL Intravenous Q12H   sodium chloride flush  3 mL Intravenous Q12H   umeclidinium bromide  1 puff Inhalation Daily   Continuous Infusions:  sodium chloride     cefTRIAXone (ROCEPHIN)  IV       LOS: 0 days   Joseph ArtJessica U Tell Rozelle  [facility]   How to contact the [facility] Attending or Consulting provider 7A - 7P or covering provider during after hours 7P -7A, for this patient?  1. Check the care team in [facility] and look for a) attending/consulting [facility] provider listed and b) the [facility] team listed 2. Log into [email or web address] and use [place]'s universal password to access. If you do not have the password, please contact the hospital operator. 3. Locate the [facility] provider  you are looking for under [facility] and page to a number that you can be directly reached. 4. If you still have difficulty reaching the provider, please page the [facility] (Director on Call) for the Hospitalists listed on amion for assistance.  03/22/2019, 4:35 PM

## 2019-03-22 NOTE — ED Notes (Addendum)
PT is at [facility] go up to floor upon return

## 2019-03-22 NOTE — Sedation Documentation (Signed)
Notified Dr. Anselm Pancoast that EtCO2 is elevated.  No versed to be given at this time.  Will cont to monitor

## 2019-03-22 NOTE — Progress Notes (Signed)
.      Chief Complaint: Patient was seen in consultation today for hydronephrosis, perinephric fluid collection  Referring Physician(s): Dr. Antionette Charpyd  Supervising Physician: Richarda OverlieHenn, Adam  [facility]  History of Present Illness: [name] is a 85 y.o. male with medical history significant for atrial fibrillation on Xarelto, chronic diastolic CHF, asthma/COPD, OSA on BiPAP, hypotension on midodrine, and chronic sacral ulcer, who presented to ED with abdominal distention and diarrhea.    CT Abdomen Pelvis 03/22/19: 1. No significant small bowel distension. Mild gaseous dilatation, predominantly of the distal colon, without well-defined transition to suggest obstruction. Findings are suggestive of an ileus. 2. Moderate left hydronephrosis. Course of left ureter difficult to follow, secondary to an irregular mildly rim enhancing left retroperitoneal/perinephric mildly complex fluid collection that extends from left renal hilus to the upper pelvis. Possible small stones within the dependent right bladder. Findings could be secondary to passed kidney stone, complicated by forniceal rupture and organizing urinoma. Infected fluid collection/retroperitoneal abscess is also a consideration. There are multiple additional stones in the left kidney. The bladder demonstrates asymmetric wall thickening 3. 3.7 cm complex left renal cyst, suggest nonemergent MRI evaluation. 4. Small bilateral pleural effusions and cardiomegaly 5. Gallstones 6. Suspected transitional anatomy of the spine as detailed above. Suspected acute to subacute moderate severe compression fracture L4 involving the anterior and middle columns.  Urology was reportedly consulted and recommends percutaneous decompression. Case reviewed and approved by Dr. Lowella DandyHenn.   Patient evaluated at bedside along with his daughter.  He is alert and oriented, but is tangential during discussion.  He has no urinary complaints, only  complains of abdominal distention.  He denies pain but is "uncomfortable." Denies history of kidney stones. He understands the goals of intervention today and is agreeable.  He has notable history of hypotension and his BP is soft, but stable in IR today.  He received Rocephin overnight in the ED.    Past Medical History:  Diagnosis Date   A-fib (HCC)    Acute systolic CHF (congestive heart failure), NYHA class 3 -- Unclear etilogy (? Afib related) EF down from 60-70% to 40%. 07/05/2012   Echo 2/21: LV upper limits of normal. EF- 40%. Cannot assess Diastolic function - Aortic Sclerosis, Mod-Severe Left Atrial dilation; Mild RV & RA dilation; Moderately elevated PA peak pressure: 43mm Hg      Arthritis    knees   Asthma    ASTHMA 02/06/2007   Qualifier: Diagnosis of  By: Claiborne Billingsallahan CMA, Jacqualynn     BENIGN PROSTATIC HYPERTROPHY 05/13/2010   Qualifier: Diagnosis of  By: Amador CunasKwiatkowski  MD, Janett LabellaPeter F    Bifascicular block 12/18/2013   BPH (benign prostatic hypertrophy)    Bronchospasm, exercise-induced    CHF (congestive heart failure) (HCC)    systolic, EF 40% (07/07/2012)   Chronic kidney disease    stabilized, due to infection   Chronic rhinitis 06/02/2009   Qualifier: Diagnosis of  By: Lovell SheehanJenkins MD, John E    DJD (degenerative joint disease), lumbar 01/18/2012   Dyslipidemia    Edema    Bilateral - left lower leg greater than right- wears compression stockings   General weakness 07/05/2012   GERD (gastroesophageal reflux disease)    History of elevated glucose 10/01/2009   Qualifier: Diagnosis of  By: Lovell SheehanJenkins MD, John E    Hx of colonic polyps 11/02/2011   Hypertension    INSOMNIA, CHRONIC, MILD 01/02/2008   Qualifier: Diagnosis of  By: Lovell SheehanJenkins  MD, Balinda Quails    Macular degeneration 08-06-13   bilateral -sight impaired   Obesity    Paroxysmal Atrial fibrillation - admitted with RVR 01/18/2012   S/P left THA, AA 08/13/2013   Shortness of breath     Past Surgical History:    Procedure Laterality Date   CARDIOVERSION N/A 07/07/2012   Procedure: CARDIOVERSION;  Surgeon: Chrystie Nose, MD;  Location: [facility] ENDOSCOPY;  Service: Cardiovascular;  Laterality: N/A;   CARDIOVERSION N/A 08/30/2012   Procedure: CARDIOVERSION;  Surgeon: Chrystie Nose, MD;  Location: [facility] ENDOSCOPY;  Service: Cardiovascular;  Laterality: N/A;   IR FLUORO GUIDE CV LINE RIGHT  04/15/2017   IR US GUIDE VASC ACCESS RIGHT  04/15/2017   IRRIGATION AND DEBRIDEMENT ABSCESS N/A 02/08/2017   Procedure: IRRIGATION AND DEBRIDEMENT SACRAL ULCER;  Surgeon: Berna Bue, MD;  Location: [facility] ORS;  Service: General;  Laterality: N/A;   TOTAL HIP ARTHROPLASTY Left 08/13/2013   Procedure: LEFT TOTAL HIP ARTHROPLASTY ANTERIOR APPROACH;  Surgeon: [name], MD;  Location: [facility] ORS;  Service: Orthopedics;  Laterality: Left;   TOTAL KNEE ARTHROPLASTY Left 01/24/2017   Procedure: LEFT TOTAL KNEE ARTHROPLASTY;  Surgeon: Durene Romans, MD;  Location: [facility] ORS;  Service: Orthopedics;  Laterality: Left;  90 mins   TRANSTHORACIC ECHOCARDIOGRAM  07/07/2012   ef 40%; mild MR; LA mod-severely dilated; RA mildly dilated; RV systolic function mildly reduced; RV systolic pressure increase - mod pulm htn   VASCULAR SURGERY  removed varicose veins   VASECTOMY     viritual colonoscopy  04/2017   Dr Marca Ancona    Allergies: Nsaids, Morphine and related, and Vicodin [hydrocodone-acetaminophen]  Medications: Prior to Admission medications   Medication Sig Start Date End Date Taking? Authorizing Provider  acetaminophen (TYLENOL) 500 MG tablet Take 2 tablets (1,000 mg total) by mouth every 8 (eight) hours. Patient taking differently: Take 1,000 mg by mouth See admin instructions. Take 1,000 mg by mouth every eight hours and do not exceed 4,000 mg/24 hours from all combined sources 01/24/17  Yes Babish, Molli Hazard, [name]  albuterol (PROVENTIL HFA;VENTOLIN HFA) 108 (90 Base) MCG/ACT inhaler Inhale 2 puffs into the lungs every 4  (four) hours as needed for wheezing or shortness of breath.    Yes [provider]  Amino Acids-Protein Hydrolys (FEEDING SUPPLEMENT, PRO-STAT SUGAR FREE 64,) LIQD Take 30 mLs by mouth 3 (three) times daily.    Yes [provider]  amiodarone (PACERONE) 200 MG tablet TAKE ONE TABLET BY MOUTH ONCE DAILY Patient taking differently: Take 200 mg by mouth daily.  10/29/16  Yes Hilty, Lisette Abu, MD  Artificial Saliva (BIOTENE DRY MOUTH MOISTURIZING) SOLN 1 spray by Mouth Rinse route every 2 (two) hours as needed (for dryness).   Yes [provider]  bisacodyl (DULCOLAX) 10 MG suppository Place 10 mg rectally daily. FOR 3 DAYS 03/19/19 03/22/19 Yes [provider]  bumetanide (BUMEX) 1 MG tablet Take 1 tablet (1 mg total) by mouth 2 (two) times daily. 10/15/16  Yes Hilty, Lisette Abu, MD  cetirizine (ZYRTEC) 10 MG tablet Take 10 mg by mouth daily.    Yes [provider]  Cholecalciferol (VITAMIN D-3) 25 MCG (1000 UT) CAPS Take 1,000 Units by mouth daily.   Yes [provider]  docusate sodium 100 MG CAPS Take 100 mg by mouth 2 (two) times daily. 08/15/13  Yes Babish, Molli Hazard, [name]  famotidine (PEPCID) 20 MG tablet Take 1 tablet (20 mg total) by mouth 2 (two) times daily.  02/21/17  Yes Patrecia Pour, MD  ferrous sulfate (FERROUSUL) 325 (65 FE) MG tablet Take 1 tablet (325 mg total) by mouth 3 (three) times daily with meals. Patient taking differently: Take 325 mg by mouth 2 (two) times daily with a meal.  01/24/17  Yes Babish, Rodman Key, [name]  gabapentin (NEURONTIN) 400 MG capsule TAKE 1 CAPSULE BY MOUTH THREE TIMES DAILY Patient taking differently: Take 400 mg by mouth 3 (three) times daily.  01/14/17  Yes Marletta Lor, MD  lactulose [facility]) 10 GM/15ML solution Take 20 g by mouth daily. FOR 5 DAYS 03/19/19 03/24/19 Yes [provider]  metoCLOPramide (REGLAN) 5 MG tablet Take 2.5 mg by mouth 2 (two) times daily. FOR 7 DAYS 03/19/19 03/26/19 Yes  [provider]  midodrine (PROAMATINE) 10 MG tablet Take 1 tablet (10 mg total) by mouth 3 (three) times daily. 05/28/17  Yes Patrecia Pour, MD  Multiple Vitamins-Minerals (CERTAGEN PO) Take 1 tablet by mouth daily.    Yes [provider]  Multiple Vitamins-Minerals (PRESERVISION AREDS 2) CAPS Take 1 capsule by mouth 2 (two) times daily.   Yes [provider]  nutrition supplement, JUVEN, (JUVEN) PACK Take 1 packet by mouth 2 (two) times daily between meals. 02/21/17  Yes Patrecia Pour, MD  Nutritional Supplements (RESOURCE 2.0) LIQD Take 120 mLs by mouth at bedtime.   Yes [provider]  OXYGEN Inhale 2 L/min into the lungs as needed (for shortness of breath).    Yes [provider]  polyethylene glycol (MIRALAX / GLYCOLAX) packet Take 17 g by mouth 2 (two) times daily. Patient taking differently: Take 17 g by mouth See admin instructions. Mix 17 grams into 4-8 ounces of liquid and drink two times a day 01/24/17  Yes Babish, Rodman Key, [name]  potassium chloride (KLOR-CON) 20 MEQ packet Take 20 mEq by mouth daily.   Yes [provider]  PRESCRIPTION MEDICATION BiPAP: At bedtime   Yes [provider]  rivaroxaban (XARELTO) 20 MG TABS tablet Take 1 tablet (20 mg total) by mouth daily. 08/05/15  Yes Hilty, Nadean Corwin, MD  rosuvastatin (CRESTOR) 20 MG tablet TAKE ONE TABLET BY MOUTH ONCE A WEEK, FRIDAY Patient taking differently: Take 20 mg by mouth every Friday.  12/24/16  Yes Hilty, Nadean Corwin, MD  senna (SENOKOT) 8.6 MG TABS tablet Take 2 tablets by mouth 2 (two) times daily. FOR 7 DAYS 03/15/19 03/22/19 Yes [provider]  simethicone (MYLICON) 782 MG chewable tablet Chew 125 mg by mouth 3 (three) times daily. FOR 5 DAYS 04/05/2019 03/26/19 Yes [provider]  SPIRIVA HANDIHALER 18 MCG inhalation capsule INHALE ONE PUFF BY MOUTH ONCE DAILY Patient taking differently: Place 18 mcg into inhaler and inhale daily.  03/18/16  Yes  Marletta Lor, MD  tamsulosin (FLOMAX) 0.4 MG CAPS capsule TAKE 1 CAPSULE BY MOUTH ONCE DAILY Patient taking differently: Take 0.4 mg by mouth daily.  12/24/16  Yes Marletta Lor, MD  traMADol (ULTRAM) 50 MG tablet Take 1-2 tablets (50-100 mg total) by mouth every 12 (twelve) hours as needed for moderate pain or severe pain (and before hydrotherapy). Patient taking differently: Take 50 mg by mouth at bedtime.  05/28/17  Yes Patrecia Pour, MD  Magnesium 250 MG TABS Take 250 mg by mouth daily.     [provider]  Melatonin 3 MG CAPS Take 3 mg by mouth as needed (insomnia).    [provider]  Multiple Vitamin (MULTIVITAMIN WITH MINERALS)  TABS tablet Take 1 tablet by mouth daily.    [provider]  protein supplement shake (PREMIER PROTEIN) LIQD Take 325 mLs (11 oz total) by mouth daily. Patient not taking: Reported on 03/24/2019 02/21/17   Tyrone Nine, MD     Family History  Problem Relation Age of Onset   Alzheimer's disease Mother    Heart disease Father    Colon cancer Neg Hx    Esophageal cancer Neg Hx     Social History   Socioeconomic History   Marital status: Widowed    Spouse name: Not on file   Number of children: 2   Years of education: Not on file   Highest education level: Not on file  Occupational History   Occupation: retired    Associate Professor: RETIRED  Ecologist strain: Not on file   Food insecurity    Worry: Not on file    Inability: Not on file   Transportation needs    Medical: Not on file    Non-medical: Not on file  Tobacco Use   Smoking status: Former Smoker   Smokeless tobacco: Never Used   Tobacco comment: quit in 1956  Substance and Sexual Activity   Alcohol use: No   Drug use: No   Sexual activity: Not Currently  Lifestyle   Physical activity    Days per week: Not on file    Minutes per session: Not on file   Stress: Not on file  Relationships   Social  connections    Talks on phone: Not on file    Gets together: Not on file    Attends religious service: Not on file    Active member of club or organization: Not on file    Attends meetings of clubs or organizations: Not on file    Relationship status: Not on file  Other Topics Concern   Not on file  Social History Narrative   Admitted to [facility] 01/28/17   Widowed   Former smoker-stopped 1966   Alcohol  None   Full Code     Review of Systems: A 12 point ROS discussed and pertinent positives are indicated in the HPI above.  All other systems are negative.  Review of Systems  Constitutional: Negative for fatigue and fever.  Respiratory: Negative for cough and shortness of breath.   Cardiovascular: Negative for chest pain.  Gastrointestinal: Positive for abdominal distention and diarrhea. Negative for abdominal pain.  Genitourinary: Negative for dysuria, frequency, penile pain and urgency.  Musculoskeletal: Negative for back pain.  Psychiatric/Behavioral: Negative for behavioral problems and confusion.    Vital Signs: BP (!) 102/46 (BP Location: Right Arm)    Pulse 72    Temp 99.1 F (37.3 C) (Rectal)    Resp (!) 24    SpO2 94%   Physical Exam Vitals signs and nursing note reviewed.  Constitutional:      General: He is not in acute distress.    Appearance: Normal appearance. He is not ill-appearing.  HENT:     Mouth/Throat:     Mouth: Mucous membranes are moist.     Pharynx: Oropharynx is clear.  Cardiovascular:     Rate and Rhythm: Normal rate and regular rhythm.     Heart sounds: No murmur.  Pulmonary:     Effort: Pulmonary effort is normal. No respiratory distress.     Breath sounds: Normal breath sounds.  Abdominal:     General: There  is distension.     Tenderness: There is abdominal tenderness.  Skin:    General: Skin is warm and dry.  Neurological:     General: No focal deficit present.     Mental Status: He is alert and oriented to person,  place, and time. Mental status is at baseline.  Psychiatric:        Mood and Affect: Mood normal.        Behavior: Behavior normal.        Thought Content: Thought content normal.        Judgment: Judgment normal.      MD Evaluation Airway: WNL Heart: WNL Abdomen: WNL Chest/ Lungs: WNL ASA  Classification: 3 Mallampati/Airway Score: One   Imaging: Ct Abdomen Pelvis W Contrast  Result Date: 03/29/2019 CLINICAL DATA:  Abdomen distension EXAM: CT ABDOMEN AND PELVIS WITH CONTRAST TECHNIQUE: Multidetector CT imaging of the abdomen and pelvis was performed using the standard protocol following bolus administration of intravenous contrast. CONTRAST:  OMNIPAQUE IOHEXOL 300 MG/ML  SOLN COMPARISON:  04/09/2019, 01/19/2012, May 09, 2017 FINDINGS: Lower chest: Lung bases demonstrate small pleural effusions. Partial atelectasis within the posterior lower lobes. Cardiomegaly. Hepatobiliary: Subcentimeter hypodensities in the liver too small to further characterize. Small stones in the gallbladder. No biliary dilatation Pancreas: Unremarkable. No pancreatic ductal dilatation or surrounding inflammatory changes. Spleen: Normal in size without focal abnormality. Adrenals/Urinary Tract: Adrenal glands are normal. Complex exophytic cyst mid left kidney measuring 3.7 cm. Multiple stones within the left renal collecting system, measuring up to 13 mm in size. Subcentimeter hypodensities in the right kidney too small to further characterize. No right hydronephrosis. Moderate hydronephrosis of the left kidney. Urothelial enhancement of the left renal pelvis. The left ureter is difficult to follow. No definitive ureteral stones. Irregular mildly rim enhancing left retroperitoneal fluid collection, extending from left renal hilus to the left upper pelvis. By 8.3 cm oblique craniocaudad. Asymmetric wall thickening of the bladder. Possible small stones within the right dependent bladder. Stomach/Bowel: The  stomach is nonenlarged. No significant small bowel distension. Mild diffuse gaseous enlargement of the left colon without definitive transition point to suggest obstruction. No colon wall thickening. Vascular/Lymphatic: Extensive aortic atherosclerosis. No aneurysm. Subcentimeter retroperitoneal nodes. Reproductive: Prostate is unremarkable. Other: No free air Musculoskeletal: Suspected transitional anatomy. For the purposes of reporting, transitional segment will be lumbarized S1. Acute to subacute marked compression fracture of L4 involving both the anterior and middle columns. No fracture lucency at the posterior neural arch. About 40% loss of height of the vertebral body. Midline sacral decubitus ulcer with bony remodeling/chronic erosion of the sacrococcygeal region IMPRESSION: 1. No significant small bowel distension. Mild gaseous dilatation, predominantly of the distal colon, without well-defined transition to suggest obstruction. Findings are suggestive of an ileus. 2. Moderate left hydronephrosis. Course of left ureter difficult to follow, secondary to an irregular mildly rim enhancing left retroperitoneal/perinephric mildly complex fluid collection that extends from left renal hilus to the upper pelvis. Possible small stones within the dependent right bladder. Findings could be secondary to passed kidney stone, complicated by forniceal rupture and organizing urinoma. Infected fluid collection/retroperitoneal abscess is also a consideration. There are multiple additional stones in the left kidney. The bladder demonstrates asymmetric wall thickening 3. 3.7 cm complex left renal cyst, suggest nonemergent MRI evaluation. 4. Small bilateral pleural effusions and cardiomegaly 5. Gallstones 6. Suspected transitional anatomy of the spine as detailed above. Suspected acute to subacute moderate severe compression fracture L4 involving the anterior and middle  columns. Electronically Signed   By: Jasmine PangKim  Fujinaga M.D.    On: 07/17/18 19:59   Aas  Result Date: 03/23/2019 CLINICAL DATA:  Abdomen distension EXAM: DG ABDOMEN ACUTE W/ 1V CHEST COMPARISON:  05/28/2017, 05/27/2017 FINDINGS: Single-view chest demonstrate small pleural effusions and basilar atelectasis. Mild cardiomegaly with aortic atherosclerosis. Prominent central pulmonary vessels, possible arterial hypertension. Supine and upright views of the abdomen demonstrate no free air beneath the diaphragm. Mild to moderate diffuse gaseous enlargement of small and large bowel with scattered rectal gas. Status post left hip replacement. No radiopaque calculi. IMPRESSION: 1. Cardiomegaly with small pleural effusions and basilar atelectasis 2. Mild to moderate diffuse increased small and large bowel gas suggestive of an ileus. Electronically Signed   By: Jasmine PangKim  Fujinaga M.D.   On: 07/17/18 16:49    Labs:  CBC: Recent Labs    09/27/18 1749 03/22/19 0450  WBC 10.6* 11.4*  HGB 10.9* 10.5*  HCT 34.3* 33.3*  PLT 279 302    COAGS: Recent Labs    03/22/19 0759  INR 1.1    BMP: Recent Labs    09/27/18 1749 03/22/19 0450  NA 142 141  K 2.9* 3.4*  CL 94* 97*  CO2 37* 37*  GLUCOSE 111* 106*  BUN 22 14  CALCIUM 8.1* 8.2*  CREATININE 0.74 0.62  GFRNONAA >60 >60  GFRAA >60 >60    LIVER FUNCTION TESTS: Recent Labs    09/27/18 1749  BILITOT 0.4  AST 19  ALT 15  ALKPHOS 64  PROT 5.6*  ALBUMIN 2.0*    TUMOR MARKERS: No results for input(s): AFPTM, CEA, CA199, CHROMGRNA in the last 8760 hours.  Assessment and Plan: Left hydronephrosis,  Left perinephric fluid collection Patient with history of a fib on Xarelto, hypotension presents with abdominal distention, watery diarrhea.  He is found to have possible ileus, left hydronephrosis, left perinephric fluid collection.  IR consulted for aspiration and drainage as well as percutaneous nephrostomy tube placement.  Patient's last dose of Xarelto was yesterday.  INR today is 1.1 SCr  0.62  Discussed procedure with patient and his daughter.  They are agreeable to proceed. Prophylactic Cipro ordered.   Risks and benefits discussed with the patient including bleeding, infection, damage to adjacent structures, bowel perforation/fistula connection, significant bleeding causing loss or decrease in renal function or damage to adjacent structures, and sepsis.  All of the patient's questions were answered, patient is agreeable to proceed. Consent signed and in chart.   Thank you for this interesting consult.  I greatly enjoyed meeting [name] and look forward to participating in their care.  A copy of this report was sent to the requesting provider on this date.  Electronically Signed: Hoyt KochKacie Sue-Ellen Lennis Rader, PA 03/22/2019, 9:59 AM   I spent a total of 40 Minutes    in face to face in clinical consultation, greater than 50% of which was counseling/coordinating care for hydronephrosis, perinephric fluid collection.

## 2019-03-22 NOTE — Progress Notes (Signed)
Per daughter, patient has H/O atrial fib. And is on amiodarone, cardiac rhythm has converted from NSR to afib.  Will cont to monitor

## 2019-03-22 NOTE — Sedation Documentation (Signed)
Notified Dr. Anselm Pancoast that EtCO2 readings are 60-70's.  No versed for procedure, at this time, only fentanyl, per Dr. Anselm Pancoast

## 2019-03-22 NOTE — Procedures (Signed)
Interventional Radiology Procedure:   Indications: Left hydronephrosis and probable left peri-ureteral urinoma  Procedure: Left nephrostomy tube placement  Findings: Moderate left hydronephrosis. Placed 10 Fr drain in mid/upper calyx.   Blood tinged urine removed, specimen collected for culture.   Complications: None     EBL: less than 10 ml  Plan: Left nephrostomy to gravity drain.  Cannot see urinoma with [place] and will attempt drainage with CT guidance.     Karletta Millay R. Anselm Pancoast, MD  Pager: [phone]

## 2019-03-22 NOTE — Procedures (Signed)
Interventional Radiology Procedure:   Indications: Left retroperitoneal fluid collection.  Recent left nephrostomy for left hydronephrosis.  Procedure: CT guided abscess drain placement  Findings: Complex left retroperitoneal fluid collection.  Placed 10 Fr drain and removed brown purulent fluid.   Complications: None     EBL: less than 10 ml   Plan: Send fluid for culture.  Will need f/u imaging in a few days to look for undrained collections.    Alisah Grandberry R. Anselm Pancoast, MD  Pager: [phone]

## 2019-03-22 NOTE — ED Notes (Signed)
Spoke CN in Radiology Patient is going for a second procedure The team agreed to transport patient to his assigned bed on 3E22

## 2019-03-22 NOTE — Sedation Documentation (Signed)
Changed brief, patient periarea and buttocks cleaned.  Urine and stool present.

## 2019-03-22 NOTE — ED Notes (Signed)
Report gotten from [place], [place]

## 2019-03-23 MED ORDER — POTASSIUM CHLORIDE 20 MEQ PO PACK
20.0000 meq | PACK | Freq: Every day | ORAL | Status: DC
  Administered 2019-03-23 – 2019-04-10 (×19): 20 meq via ORAL
  Filled 2019-03-23 (×20): qty 1

## 2019-03-23 MED ORDER — PRO-STAT SUGAR FREE PO LIQD
30.0000 mL | Freq: Two times a day (BID) | ORAL | Status: DC
  Administered 2019-03-23 – 2019-04-11 (×32): 30 mL via ORAL
  Filled 2019-03-23 (×35): qty 30

## 2019-03-23 MED ORDER — ADULT MULTIVITAMIN W/MINERALS CH
1.0000 | ORAL_TABLET | Freq: Every day | ORAL | Status: DC
  Administered 2019-03-23 – 2019-04-11 (×20): 1 via ORAL
  Filled 2019-03-23 (×20): qty 1

## 2019-03-23 MED ORDER — BIOTENE DRY MOUTH MOISTURIZING MT SOLN: 1.0000 | OROMUCOSAL | Status: DC | PRN

## 2019-03-23 MED ORDER — ENSURE MAX PROTEIN PO LIQD
11.0000 [oz_av] | Freq: Every day | ORAL | Status: DC
  Administered 2019-03-23: 237 mL via ORAL
  Administered 2019-03-24 – 2019-04-10 (×15): 11 [oz_av] via ORAL
  Filled 2019-03-23 (×22): qty 330

## 2019-03-23 MED ORDER — BIOTENE DRY MOUTH MT LIQD: 15.0000 mL | OROMUCOSAL | Status: DC | PRN

## 2019-03-23 MED ORDER — JUVEN PO PACK
1.0000 | PACK | Freq: Two times a day (BID) | ORAL | Status: DC
  Administered 2019-03-23 – 2019-04-11 (×35): 1 via ORAL
  Filled 2019-03-23 (×35): qty 1

## 2019-03-23 NOTE — Progress Notes (Signed)
Progress Note    [name]  ZOX:[id number] DOB: 12-29-1926  DOA: 03/24/2019 PCP: System, Pcp Not In    Brief Narrative:     Medical records reviewed and are as summarized below:  [name] is an 85 y.o. male with medical history significant for atrial fibrillation on Xarelto, chronic diastolic CHF, asthma/COPD, OSA on BiPAP, hypotension on midodrine, and chronic sacral ulcer, now presenting to the emergency department for evaluation of progressive abdominal distention with alternating constipation and watery stool.  Patient is accompanied by his daughter who assists with the history.  Patient reportedly developed constipation several days ago, reports some abdominal distention since then, and has had a couple watery stools.  He denies any abdominal pain but reports some mild generalized discomfort.  He denies nausea or vomiting but his daughter notes that he vomited a small amount earlier.  Patient denies any fevers, chills, flank pain, or dysuria.  He denies any change in his chronic back pain.  Assessment/Plan:   Principal Problem:   Perinephric fluid collection Active Problems:   Asthma   Paroxysmal Atrial fibrillation - admitted with RVR   Chronic diastolic CHF (congestive heart failure) (HCC)   AF (paroxysmal atrial fibrillation) (HCC)   Hypokalemia   Ileus (HCC)   Closed compression fracture of L4 vertebra (HCC)   Sacral pressure ulcer   Renal cyst, left   Perinephric fluid collection  - Presents with several days of abdominal distension, alternating constipation and watery stool, and is found to have retroperitoneal/perinephric fluid collection with DDx including urinoma or abscess  - Urology was consulted by ED physician and recommended IR drainage  - IR: s/p nephrostomy tube -IV abx for now while culture pending -will need short term imagine follow up per Dr. Lowella Dandy -urology consult pending  Ileus   -appears resolved -bowel sounds sluggish but passing  gas and having BMs  Chronic diastolic CHF  - Appears compensated   Atrial fibrillation  - In sinus rhythm on admission  - CHADS-VASc at least 3 (age x2, CHF)  - Xarelto held on admission in anticipation of possible procedure, consider starting IV heparin tomorrow if he will need to remain off of Xarelto for surgery   -will revisit this once we have a plan from urology   Hypokalemia  - replace -recheck in AM   L4 compression fracture  - Noted on CT, appears to be acute or subacute  - patient denies any recent fall or trauma, denies change in his chronic back pain, and denies new weakness or numbness  - Continue supportive care    Left renal cyst  - Noted on CT in ED with non-emergent MRI recommended    COPD  - No cough or wheezing on admission, uses 1-2 Lpm as needed at SNF and uses BiPAP qHS  - Continue Spiriva, as-needed albuterol, as-needed supplemental O2, and BiPAP qHS   Sacral pressure ulcer - POA - Patient has hx of chronic sacral ulcer and underlying osteomyelitis  - WOC consult appreciated: continue recommended wound care   Family Communication/Anticipated D/C date and plan/Code Status   DVT prophylaxis:  Code Status: partial  Family Communication: son at bedside Disposition Plan:    Medical Consultants:    IR  urology     Subjective:   No shortness of breath  Objective:    Vitals:   03/23/19 0544 03/23/19 0548 03/23/19 0551 03/23/19 0755  BP: (!) 112/38  (!) 105/45 (!) 90/33  Pulse: 68 70 71  71  Resp: 16   20  Temp: 98.3 F (36.8 C)   (!) 97.4 F (36.3 C)  TempSrc: Oral   Oral  SpO2: (!) 88% 95%  96%  Weight:      Height:        Intake/Output Summary (Last 24 hours) at 03/23/2019 1256 Last data filed at 03/23/2019 1100 Gross per 24 hour  Intake 1830 ml  Output 350 ml  Net 1480 ml   Filed Weights   03/22/19 1542 03/23/19 0500  Weight: 94.8 kg 93.5 kg    Exam: In bed, son at bedside Chronically ill appearing rrr No  increased work of breathing alert  Data Reviewed:   I have personally reviewed following labs and imaging studies:  Labs: Labs show the following:   Basic Metabolic Panel: Recent Labs  Lab 04/07/2019 1749 03/22/19 0450  NA 142 141  K 2.9* 3.4*  CL 94* 97*  CO2 37* 37*  GLUCOSE 111* 106*  BUN 22 14  CREATININE 0.74 0.62  CALCIUM 8.1* 8.2*  MG  --  2.1   GFR Estimated Creatinine Clearance: 71.9 mL/min (by C-G formula based on SCr of 0.62 mg/dL). Liver Function Tests: Recent Labs  Lab 03/24/2019 1749  AST 19  ALT 15  ALKPHOS 64  BILITOT 0.4  PROT 5.6*  ALBUMIN 2.0*   Recent Labs  Lab 03/20/2019 1749  LIPASE 20   No results for input(s): AMMONIA in the last 168 hours. Coagulation profile Recent Labs  Lab 03/22/19 0759  INR 1.1    CBC: Recent Labs  Lab 03/30/2019 1749 03/22/19 0450  WBC 10.6* 11.4*  NEUTROABS 7.4 8.7*  HGB 10.9* 10.5*  HCT 34.3* 33.3*  MCV 99.1 100.0  PLT 279 302   Cardiac Enzymes: No results for input(s): CKTOTAL, CKMB, CKMBINDEX, TROPONINI in the last 168 hours. BNP (last 3 results) No results for input(s): PROBNP in the last 8760 hours. CBG: No results for input(s): GLUCAP in the last 168 hours. D-Dimer: No results for input(s): DDIMER in the last 72 hours. Hgb A1c: No results for input(s): HGBA1C in the last 72 hours. Lipid Profile: No results for input(s): CHOL, HDL, LDLCALC, TRIG, CHOLHDL, LDLDIRECT in the last 72 hours. Thyroid function studies: No results for input(s): TSH, T4TOTAL, T3FREE, THYROIDAB in the last 72 hours.  Invalid input(s): FREET3 Anemia work up: No results for input(s): VITAMINB12, FOLATE, FERRITIN, TIBC, IRON, RETICCTPCT in the last 72 hours. Sepsis Labs: Recent Labs  Lab 03/20/2019 1749 03/22/19 0450  WBC 10.6* 11.4*  LATICACIDVEN 0.8  --     Microbiology Recent Results (from the past 240 hour(s))  SARS CORONAVIRUS 2 (TAT 6-24 HRS) Nasopharyngeal Nasopharyngeal Swab     Status: None   Collection  Time: 03/27/2019  5:54 PM   Specimen: Nasopharyngeal Swab  Result Value Ref Range Status   SARS Coronavirus 2 NEGATIVE NEGATIVE Final    Comment: (NOTE) SARS-CoV-2 target nucleic acids are NOT DETECTED. The SARS-CoV-2 RNA is generally detectable in upper and lower respiratory specimens during the acute phase of infection. Negative results do not preclude SARS-CoV-2 infection, do not rule out co-infections with other pathogens, and should not be used as the sole basis for treatment or other patient management decisions. Negative results must be combined with clinical observations, patient history, and epidemiological information. The expected result is Negative. Fact Sheet for Patients: HairSlick.nohttps://www.fda.gov/media/138098/download Fact Sheet for Healthcare Providers: quierodirigir.comhttps://www.fda.gov/media/138095/download This test is not yet approved or cleared by the [place] FDA and  has  been authorized for detection and/or diagnosis of SARS-CoV-2 by FDA under an Emergency Use Authorization (EUA). This EUA will remain  in effect (meaning this test can be used) for the duration of the COVID-19 declaration under Section 56 4(b)(1) of the Act, 21 U.S.C. section 360bbb-3(b)(1), unless the authorization is terminated or revoked sooner. Performed at [facility], [address] [place], [place] [address]   UCx     Status: Abnormal   Collection Time: 02-03-19  7:43 PM   Specimen: Urine, Random  Result Value Ref Range Status   Specimen Description URINE, RANDOM  Final   Special Requests NONE  Final   Culture (A)  Final    <10,000 COLONIES/mL INSIGNIFICANT GROWTH Performed at [facility], [address] [place], [place] [address]    Report Status 03/22/2019 FINAL  Final  Urine culture     Status: Abnormal (Preliminary result)   Collection Time: 03/22/19 11:20 AM   Specimen: Kidney; Urine  Result Value Ref Range Status   Specimen Description KIDNEY LEFT  Final   Special Requests  NONE  Final   Culture (A)  Final    100 COLONIES/mL PROTEUS MIRABILIS CULTURE REINCUBATED FOR BETTER GROWTH Performed at [facility], [address] [place], [place] [address]    Report Status PENDING  Incomplete  Aerobic/Anaerobic Culture (surgical/deep wound)     Status: None (Preliminary result)   Collection Time: 03/22/19  3:14 PM   Specimen: Abscess  Result Value Ref Range Status   Specimen Description ABSCESS  Final   Special Requests LEFT RETROPERITONEAL  Final   Gram Stain   Final    ABUNDANT WBC PRESENT,BOTH PMN AND MONONUCLEAR NO ORGANISMS SEEN    Culture   Final    CULTURE REINCUBATED FOR BETTER GROWTH Performed at [facility], [address] [place], [place] [address]    Report Status PENDING  Incomplete  MRSA PCR Screening     Status: None   Collection Time: 03/22/19  5:09 PM   Specimen: Nasopharyngeal  Result Value Ref Range Status   MRSA by PCR NEGATIVE NEGATIVE Final    Comment:        The GeneXpert MRSA Assay (FDA approved for NASAL specimens only), is one component of a comprehensive MRSA colonization surveillance program. It is not intended to diagnose MRSA infection nor to guide or monitor treatment for MRSA infections. Performed at [facility], [address] [place], [place] [address]     Procedures and diagnostic studies:  Ct Abdomen Pelvis W Contrast  Result Date: 03/29/2019 CLINICAL DATA:  Abdomen distension EXAM: CT ABDOMEN AND PELVIS WITH CONTRAST TECHNIQUE: Multidetector CT imaging of the abdomen and pelvis was performed using the standard protocol following bolus administration of intravenous contrast. CONTRAST:  100mL OMNIPAQUE IOHEXOL 300 MG/ML  SOLN COMPARISON:  02-03-2019, 01/19/2012, May 09, 2017 FINDINGS: Lower chest: Lung bases demonstrate small pleural effusions. Partial atelectasis within the posterior lower lobes. Cardiomegaly. Hepatobiliary: Subcentimeter hypodensities in the liver too small to further  characterize. Small stones in the gallbladder. No biliary dilatation Pancreas: Unremarkable. No pancreatic ductal dilatation or surrounding inflammatory changes. Spleen: Normal in size without focal abnormality. Adrenals/Urinary Tract: Adrenal glands are normal. Complex exophytic cyst mid left kidney measuring 3.7 cm. Multiple stones within the left renal collecting system, measuring up to 13 mm in size. Subcentimeter hypodensities in the right kidney too small to further characterize. No right hydronephrosis. Moderate hydronephrosis of the left kidney. Urothelial  enhancement of the left renal pelvis. The left ureter is difficult to follow. No definitive ureteral stones. Irregular mildly rim enhancing left retroperitoneal fluid collection, extending from left renal hilus to the left upper pelvis. By 8.3 cm oblique craniocaudad. Asymmetric wall thickening of the bladder. Possible small stones within the right dependent bladder. Stomach/Bowel: The stomach is nonenlarged. No significant small bowel distension. Mild diffuse gaseous enlargement of the left colon without definitive transition point to suggest obstruction. No colon wall thickening. Vascular/Lymphatic: Extensive aortic atherosclerosis. No aneurysm. Subcentimeter retroperitoneal nodes. Reproductive: Prostate is unremarkable. Other: No free air Musculoskeletal: Suspected transitional anatomy. For the purposes of reporting, transitional segment will be lumbarized S1. Acute to subacute marked compression fracture of L4 involving both the anterior and middle columns. No fracture lucency at the posterior neural arch. About 40% loss of height of the vertebral body. Midline sacral decubitus ulcer with bony remodeling/chronic erosion of the sacrococcygeal region IMPRESSION: 1. No significant small bowel distension. Mild gaseous dilatation, predominantly of the distal colon, without well-defined transition to suggest obstruction. Findings are suggestive of an ileus.  2. Moderate left hydronephrosis. Course of left ureter difficult to follow, secondary to an irregular mildly rim enhancing left retroperitoneal/perinephric mildly complex fluid collection that extends from left renal hilus to the upper pelvis. Possible small stones within the dependent right bladder. Findings could be secondary to passed kidney stone, complicated by forniceal rupture and organizing urinoma. Infected fluid collection/retroperitoneal abscess is also a consideration. There are multiple additional stones in the left kidney. The bladder demonstrates asymmetric wall thickening 3. 3.7 cm complex left renal cyst, suggest nonemergent MRI evaluation. 4. Small bilateral pleural effusions and cardiomegaly 5. Gallstones 6. Suspected transitional anatomy of the spine as detailed above. Suspected acute to subacute moderate severe compression fracture L4 involving the anterior and middle columns. Electronically Signed   By: Jasmine Pang M.D.   On: 04/12/19 19:59   Aas  Result Date: 04/12/19 CLINICAL DATA:  Abdomen distension EXAM: DG ABDOMEN ACUTE W/ 1V CHEST COMPARISON:  05/28/2017, 05/27/2017 FINDINGS: Single-view chest demonstrate small pleural effusions and basilar atelectasis. Mild cardiomegaly with aortic atherosclerosis. Prominent central pulmonary vessels, possible arterial hypertension. Supine and upright views of the abdomen demonstrate no free air beneath the diaphragm. Mild to moderate diffuse gaseous enlargement of small and large bowel with scattered rectal gas. Status post left hip replacement. No radiopaque calculi. IMPRESSION: 1. Cardiomegaly with small pleural effusions and basilar atelectasis 2. Mild to moderate diffuse increased small and large bowel gas suggestive of an ileus. Electronically Signed   By: Jasmine Pang M.D.   On: 04-12-2019 16:49   Ir Nephrostomy Placement Left  Result Date: 03/22/2019 INDICATION: 85 year old with left hydronephrosis and fluid collections in the  left abdomen along the left ureter. Plan for percutaneous nephrostomy tube placement and drainage of the abdominal fluid collections. EXAM: LEFT PERCUTANEOUS NEPHROSTOMY TUBE PLACEMENT WITH ULTRASOUND AND FLUOROSCOPIC GUIDANCE COMPARISON:  None. MEDICATIONS: Ciprofloxacin 400 mg; The antibiotic was administered in an appropriate time frame prior to skin puncture. ANESTHESIA/SEDATION: Fentanyl 75 mcg IV The patient was continuously monitored during the procedure by the interventional radiology nurse under my direct supervision. CONTRAST:  10 mL Omnipaque 300-administered into the collecting system(s) FLUOROSCOPY TIME:  Fluoroscopy Time: 2 minutes, 6 seconds, 13 mGy COMPLICATIONS: None immediate. PROCEDURE: Informed written consent was obtained from the patient after a thorough discussion of the procedural risks, benefits and alternatives. All questions were addressed. Maximal Sterile Barrier Technique was utilized including caps, mask, sterile gowns, sterile  gloves, sterile drape, hand hygiene and skin antiseptic. A timeout was performed prior to the initiation of the procedure. Patient was placed prone. Ultrasound was used to identify the left kidney. The left flank was prepped and draped in sterile fashion. Skin was anesthetized with 1% lidocaine. Using ultrasound guidance, 21 gauge needle was directed into a mid/upper pole calyx. Wire was advanced into the collecting system. An Accustick dilator set was placed. Tract was dilated to accommodate a 10.2 [place] multipurpose drain. Fluid was aspirated from the left renal collecting system and specimen was sent for culture. Catheter was sutured to skin and attached to a gravity bag. FINDINGS: Moderate left hydronephrosis. Access obtained from a mid/upper pole calyx. Renal collecting system was decompressed at the end of the procedure. IMPRESSION: Successful placement of left percutaneous nephrostomy tube with ultrasound and fluoroscopic guidance. Urine specimen sent for  culture. Electronically Signed   By: Markus Daft M.D.   On: 03/22/2019 13:43   Ct Image Guided Drainage Percut Cath  Peritoneal Retroperit  Result Date: 03/22/2019 INDICATION: 85 year old with retroperitoneal complex fluid collection and left hydronephrosis. Patient has already undergone percutaneous left nephrostomy tube placement. Plan for CT-guided drainage of the complex retroperitoneal fluid collection. EXAM: CT-GUIDED LEFT RETROPERITONEAL ABSCESS DRAIN MEDICATIONS: The patient is currently admitted to the hospital and receiving intravenous antibiotics. The antibiotics were administered within an appropriate time frame prior to the initiation of the procedure. ANESTHESIA/SEDATION: Fentanyl 50 mcg The patient was continuously monitored during the procedure by the interventional radiology nurse under my direct supervision. COMPLICATIONS: None immediate. PROCEDURE: Informed written consent was obtained from the patient and daughter after a thorough discussion of the procedural risks, benefits and alternatives. All questions were addressed. A timeout was performed prior to the initiation of the procedure. Patient was placed prone. CT images through the abdomen were obtained. The left retroperitoneal complex fluid collection was targeted. The left flank was prepped with chlorhexidine and sterile field was created. Skin and soft tissues were anesthetized with 1% lidocaine. Using CT guidance, a 18 gauge trocar needle was directed into the retroperitoneal fluid collection. Brown purulent fluid was aspirated. Stiff Amplatz wire was advanced into the collection. Tract was dilated to accommodate a 10 [place] drain. Additional brown purulent fluid was removed. Follow up CT images were obtained. Catheter was sutured to skin and attached to a suction bulb. FINDINGS: Left kidney has been decompressed with a percutaneous nephrostomy tube. Perinephric and left retroperitoneal complex fluid collection was targeted. The lateral  aspect of the collection was punctured and purulent fluid was aspirated. Stiff Amplatz wire advanced into a portion of the collection and a 10 French drain was placed. Follow up CT images demonstrated that the drain may not fully decompress the more medial collections. IMPRESSION: CT-guided placement of a drainage catheter within the complex left retroperitoneal abscess collection. Not clear if the most medial aspect of the abscess will be drained from this catheter. Recommend short-term follow-up imaging to evaluate this drainage catheter and the complex collection. Electronically Signed   By: Markus Daft M.D.   On: 03/22/2019 16:54    Medications:    amiodarone  200 mg Oral Daily   famotidine  20 mg Oral BID   feeding supplement (PRO-STAT SUGAR FREE 64)  30 mL Oral BID WC   gabapentin  400 mg Oral TID   midodrine  10 mg Oral TID WC   multivitamin with minerals  1 tablet Oral Daily   nutrition supplement (JUVEN)  1 packet Oral BID  BM   Ensure Max Protein  11 oz Oral QHS   sodium chloride flush  3 mL Intravenous Q12H   sodium chloride flush  3 mL Intravenous Q12H   sodium chloride flush  5 mL Intracatheter Q8H   umeclidinium bromide  1 puff Inhalation Daily   Continuous Infusions:  sodium chloride Stopped (03/22/19 2014)   cefTRIAXone (ROCEPHIN)  IV Stopped (03/22/19 2015)     LOS: 1 day   Joseph Art  [facility]   How to contact the [facility] Attending or Consulting provider 7A - 7P or covering provider during after hours 7P -7A, for this patient?  1. Check the care team in [facility] and look for a) attending/consulting TR[facility] provider listed and b) the [facility] team listed 2. Log into [email or web address] and use [place]'s universal password to access. If you do not have the password, please contact the hospital operator. 3. Locate the [facility] provider you are looking for under [facility] and page to a number that you can be directly reached. 4. If you still have difficulty  reaching the provider, please page the [facility] (Director on Call) for the Hospitalists listed on amion for assistance.  03/23/2019, 12:56 PM

## 2019-03-23 NOTE — [place] FL2 (Signed)
[place] MEDICAID FL2 LEVEL OF CARE SCREENING TOOL     IDENTIFICATION  Patient Name: [name] Birthdate: Aug 29, 1926 Sex: male Admission Date (Current Location): 03/24/2019  [place] and [place] Number:  Haynes Bast [id number] Facility and Address:  [facility], [address], [place], [place] [address]      Provider Number: [id number]  Attending Physician Name and Address:  Joseph Art, DO  Relative Name and Phone Number:  Frances Nickels, Daughter, [phone]    Current Level of Care: Hospital Recommended Level of Care: Skilled Nursing Facility Prior Approval Number:    Date Approved/Denied:   PASRR Number: [id number]  Discharge Plan: SNF    Current Diagnoses: Patient Active Problem List   Diagnosis Date Noted  . Perinephric fluid collection 04/15/2019  . Ileus (HCC) 03/27/2019  . Closed compression fracture of L4 vertebra (HCC) 04/11/2019  . Sacral pressure ulcer April 13, 2019  . Renal cyst, left Apr 13, 2019  . Acute encephalopathy 05/14/2017  . Permanent atrial fibrillation (HCC) 05/14/2017  . Chronic respiratory failure with hypoxia (HCC) 05/14/2017  . Chronic combined systolic and diastolic CHF (congestive heart failure) (HCC) 05/14/2017  . 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 Infected pressure ulcer 02/07/2017  . Hypokalemia 02/07/2017  . Normocytic anemia 02/07/2017  . AF (paroxysmal atrial fibrillation) (HCC) 01/31/2017  . Polyneuropathy 01/31/2017  . S/P left TKA 01/24/2017  . Non-ischemic cardiomyopathy (HCC) 10/15/2016  . Chronic diastolic CHF (congestive heart failure) (HCC) 05/28/2016  . Orthostatic hypotension 09/20/2015  . Impaired glucose tolerance 11/25/2014  . Bifascicular block 12/18/2013  . Bilateral edema of lower extremity 10/25/2013  . S/P left THA, AA 08/13/2013  . Chronic anticoagulation 12/06/2012  . General weakness 07/05/2012  . Acute diastolic CHF (congestive heart failure) (HCC) 01/22/2012  .  Hypotension 01/18/2012  . Paroxysmal Atrial fibrillation - admitted with RVR 01/18/2012  . DJD (degenerative joint disease), lumbar 01/18/2012  . Hx of colonic polyps 11/02/2011  . BENIGN PROSTATIC HYPERTROPHY 05/13/2010  . SEBORRHEA CAPITIS 02/13/2010  . CHRONIC RHINITIS 06/02/2009  . Hyperlipidemia 11/01/2008  . INSOMNIA, CHRONIC, MILD 01/02/2008  . Essential hypertension 02/06/2007  . Asthma 02/06/2007    Orientation RESPIRATION BLADDER Height & Weight     Self, Time, Situation, Place  O2(CPAP: Large adult face mask, 18 resp rate, flow rate 2; O2: 96, [place], 2L) Incontinent, External catheter Weight: 206 lb 2.1 oz (93.5 kg) Height:  5\' 10"  (177.8 cm)  BEHAVIORAL SYMPTOMS/MOOD NEUROLOGICAL BOWEL NUTRITION STATUS      Incontinent Diet(Dys 1 diet, thin liquids)  AMBULATORY STATUS COMMUNICATION OF NEEDS Skin   Limited Assist   Other (Comment), Skin abrasions, Bruising(bruising on arm, MASD on perniuem/groin, Pressure injury on sacrum (dressing in place, change daily), JP drain (dressing in place))                       Personal Care Assistance Level of Assistance  Bathing, Feeding, Dressing, Total care Bathing Assistance: Limited assistance Feeding assistance: Independent Dressing Assistance: Limited assistance Total Care Assistance: Limited assistance   Functional Limitations Info  Sight, Hearing, Speech Sight Info: Impaired Hearing Info: Impaired Speech Info: Adequate    SPECIAL CARE FACTORS FREQUENCY                       Contractures Contractures Info: Not present    Additional Factors Info  Code Status, Allergies Code Status Info: Partial Code Allergies Info: Nsaids, Morphine And Related, Vicodin (Hydrocodone-acetaminophen)  Current Medications (03/23/2019):  This is the current hospital active medication list Current Facility-Administered Medications  Medication Dose Route Frequency Provider Last Rate Last Dose  . 0.9 %  sodium chloride  infusion  250 mL Intravenous PRN Opyd, Ilene Qua, MD 10 mL/hr at 03/23/19 1611 250 mL at 03/23/19 1611  . acetaminophen (TYLENOL) tablet 650 mg  650 mg Oral Q6H PRN Opyd, Ilene Qua, MD       Or  . acetaminophen (TYLENOL) suppository 650 mg  650 mg Rectal Q6H PRN Opyd, Ilene Qua, MD      . albuterol (PROVENTIL) (2.5 MG/3ML) 0.083% nebulizer solution 2.5 mg  2.5 mg Inhalation Q4H PRN Opyd, Ilene Qua, MD      . amiodarone (PACERONE) tablet 200 mg  200 mg Oral Daily Opyd, Ilene Qua, MD   200 mg at 03/23/19 0916  . antiseptic oral rinse (BIOTENE) solution 15 mL  15 mL Mouth Rinse Q2H PRN Eliseo Squires, Jessica U, DO      . cefTRIAXone (ROCEPHIN) 2 g in sodium chloride 0.9 % 100 mL IVPB  2 g Intravenous Q24H Vann, Jessica U, DO 200 mL/hr at 03/23/19 1612 2 g at 03/23/19 1612  . famotidine (PEPCID) tablet 20 mg  20 mg Oral BID Opyd, Ilene Qua, MD   20 mg at 03/23/19 0916  . feeding supplement (PRO-STAT SUGAR FREE 64) liquid 30 mL  30 mL Oral BID WC Vann, Jessica U, DO   30 mL at 03/23/19 1157  . fentaNYL (SUBLIMAZE) injection 12.5-25 mcg  12.5-25 mcg Intravenous Q2H PRN Vianne Bulls, MD   25 mcg at 03/22/19 0308  . gabapentin (NEURONTIN) capsule 400 mg  400 mg Oral TID Vianne Bulls, MD   400 mg at 03/23/19 0916  . Melatonin TABS 3 mg  3 mg Oral QHS PRN Opyd, Ilene Qua, MD      . midodrine (PROAMATINE) tablet 10 mg  10 mg Oral TID WC Opyd, Ilene Qua, MD   10 mg at 03/23/19 1157  . multivitamin with minerals tablet 1 tablet  1 tablet Oral Daily Eulogio Bear U, DO   1 tablet at 03/23/19 1157  . nutrition supplement (JUVEN) (JUVEN) powder packet 1 packet  1 packet Oral BID BM Eulogio Bear U, DO   1 packet at 03/23/19 1609  . ondansetron (ZOFRAN) injection 4 mg  4 mg Intravenous Q6H PRN Opyd, Ilene Qua, MD      . potassium chloride (KLOR-CON) packet 20 mEq  20 mEq Oral Daily Vann, Jessica U, DO      . protein supplement (ENSURE MAX) liquid  11 oz Oral QHS Vann, Jessica U, DO      . sodium chloride flush (NS)  0.9 % injection 3 mL  3 mL Intravenous Q12H Opyd, Ilene Qua, MD   3 mL at 03/22/19 1648  . sodium chloride flush (NS) 0.9 % injection 3 mL  3 mL Intravenous Q12H Opyd, Ilene Qua, MD   3 mL at 03/23/19 1158  . sodium chloride flush (NS) 0.9 % injection 3 mL  3 mL Intravenous PRN Opyd, Ilene Qua, MD      . sodium chloride flush (NS) 0.9 % injection 5 mL  5 mL Intracatheter Q8H Henn, Adam, MD      . umeclidinium bromide (INCRUSE ELLIPTA) 62.5 MCG/INH 1 puff  1 puff Inhalation Daily Opyd, Ilene Qua, MD         Discharge Medications: Please see discharge summary for a list of discharge medications.  Relevant Imaging Results:  Relevant Lab Results:   Additional Information SSN: [id number]; COVID Negative on 04/03/2019  Dalton Molesworth B Shavon Zenz, LCSWA

## 2019-03-23 NOTE — Progress Notes (Signed)
Initial Nutrition Assessment  RD working remotely.  DOCUMENTATION CODES:   Not applicable  INTERVENTION:   -MVI with minerals daily -30 ml Prostat BID with meals, each supplement provides 100 kcals and 15 grams protein -Ensure Max po q HS, each supplement provides 150 kcal and 30 grams of protein -1 packet Juven BID, each packet provides 95 calories, 2.5 grams of protein (collagen), and 9.8 grams of carbohydrate (3 grams sugar); also contains 7 grams of L-arginine and L-glutamine, 300 mg vitamin C, 15 mg vitamin E, 1.2 mcg vitamin B-12, 9.5 mg zinc, 200 mg calcium, and 1.5 g  Calcium Beta-hydroxy-Beta-methylbutyrate to support wound healing  NUTRITION DIAGNOSIS:   Increased nutrient needs related to wound healing as evidenced by estimated needs.  GOAL:   Patient will meet greater than or equal to 90% of their needs  MONITOR:   PO intake, Supplement acceptance, Labs, Weight trends, Skin, I & O's  REASON FOR ASSESSMENT:   Low Braden    ASSESSMENT:   [name] is a 85 y.o. male with medical history significant for atrial fibrillation on Xarelto, chronic diastolic CHF, asthma/COPD, OSA on BiPAP, hypotension on midodrine, and chronic sacral ulcer, now presenting to the emergency department for evaluation of progressive abdominal distention with alternating constipation and watery stool.  Patient is accompanied by his daughter who assists with the history.  Patient reportedly developed constipation several days ago, reports some abdominal distention since then, and has had a couple watery stools.  He denies any abdominal pain but reports some mild generalized discomfort.  He denies nausea or vomiting but his daughter notes that he vomited a small amount earlier.  Patient denies any fevers, chills, flank pain, or dysuria.  He denies any change in his chronic back pain.  Pt admitted with perinephric fluid collection.   11/5- s/p lt nephrostomy tube placement, CT guided abscess  drain placement  Reviewed I/O's: +710 ml x 24 hours and +1 L since admission  UOP: 110 ml x 24 hours   Drain output: 50 ml x 24 hours  RD attempted to speak with pt via phone to obtain additional history, however, no answer.  Per review of PTA medications, pt taking 30 ml Prostat BID, Juven BID, and Premier Protein daily. Pt currently with fair appetite; noted meal completion 50%.   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 Pt son reports pressure injury has improved greatly in appearance.   Reviewed wt hx; wt has been stable over the past several years. Pt would greatly benefit from addition of nutritional supplements due to decreased intake and increased nutritional needs to support wound healing. Will order PTA supplements to mimic home regimen.   Labs reviewed.   Diet Order:   Diet Order            DIET - DYS 1 Room service appropriate? Yes; Fluid consistency: Thin  Diet effective now              EDUCATION NEEDS:   No education needs have been identified at this time  Skin:  Skin Assessment: Skin Integrity Issues: Skin Integrity Issues:: Stage IV Stage IV: sacrum  Last BM:  03/23/19  Height:   Ht Readings from Last 1 Encounters:  03/22/19 5\' 10"  (1.778 m)    Weight:   Wt Readings from Last 1 Encounters:  03/23/19 93.5 kg    Ideal Body Weight:  75.5 kg  BMI:  Body mass index is 29.58 kg/m.  Estimated  Nutritional Needs:   Kcal:  2050-2250  Protein:  100-115 grams  Fluid:  > 2 L    Allisson Schindel A. Mayford Knife, RD, LDN, CDCES Registered Dietitian II Certified Diabetes Care and Education Specialist Pager: [phone] After hours Pager: [phone]

## 2019-03-23 NOTE — Plan of Care (Signed)
  Problem: Clinical Measurements: Goal: Ability to maintain clinical measurements within normal limits will improve Outcome: Progressing   Problem: Clinical Measurements: Goal: Respiratory complications will improve Outcome: Progressing   Problem: Safety: Goal: Ability to remain free from injury will improve Outcome: Progressing   

## 2019-03-23 NOTE — Consult Note (Addendum)
[facility] Nurse wound consult note Reason for Consult: Consult requested for sacrum Wound type: Chronic stage 4 pressure injury; son at bedside states it has greatly improved in appearance and reduced in size.  Pressure Injury POA: Yes Measurement: 2.5X2.5X1cm; red and moist, bone palpable Drainage (amount, consistency, odor)  Mod amt tan drainage, no odor Periwound: intact skin surrounding Dressing procedure/placement/frequency: Pt is frequently incontinent of loose stools and it is difficult to keep wound from becoming soiled.  Topical treatment orders provided for bedside nurses to perform as follows to absorb drainage and provide antimicrobial benifits: Cut piece of Aquacel Kellie Simmering # [id number]) and tuck into the sacrum wound Q day, then cover with foam dressing.  (Change foam dressing Q 3 days or PRN soiling.) Please re-consult if further assistance is needed.  Thank-you,  Julien Girt MSN, [place], [place], [place], [place]

## 2019-03-23 NOTE — Progress Notes (Signed)
Referring Physician(s): Dr. Antionette Charpyd  Supervising Physician: Irish LackYamagata, Glenn  Patient Status:  [facility]  Chief Complaint: Left hydronephrosis Urinoma  Subjective: Patient resting comfortably this AM. States he cannot tell much difference since drain placement yesterday citing "it's too early to tell."  Son at bedside.   Allergies: Nsaids, Morphine and related, and Vicodin [hydrocodone-acetaminophen]  Medications: Prior to Admission medications   Medication Sig Start Date End Date Taking? Authorizing Provider  acetaminophen (TYLENOL) 500 MG tablet Take 2 tablets (1,000 mg total) by mouth every 8 (eight) hours. Patient taking differently: Take 1,000 mg by mouth See admin instructions. Take 1,000 mg by mouth every eight hours and do not exceed 4,000 mg/24 hours from all combined sources 01/24/17  Yes Babish, Molli HazardMatthew, [name]  albuterol (PROVENTIL HFA;VENTOLIN HFA) 108 (90 Base) MCG/ACT inhaler Inhale 2 puffs into the lungs every 4 (four) hours as needed for wheezing or shortness of breath.    Yes [provider]  Amino Acids-Protein Hydrolys (FEEDING SUPPLEMENT, PRO-STAT SUGAR FREE 64,) LIQD Take 30 mLs by mouth 3 (three) times daily.    Yes [provider]  amiodarone (PACERONE) 200 MG tablet TAKE ONE TABLET BY MOUTH ONCE DAILY Patient taking differently: Take 200 mg by mouth daily.  10/29/16  Yes Hilty, Lisette AbuKenneth C, MD  Artificial Saliva (BIOTENE DRY MOUTH MOISTURIZING) SOLN 1 spray by Mouth Rinse route every 2 (two) hours as needed (for dryness).   Yes [provider]  bumetanide (BUMEX) 1 MG tablet Take 1 tablet (1 mg total) by mouth 2 (two) times daily. 10/15/16  Yes Hilty, Lisette AbuKenneth C, MD  cetirizine (ZYRTEC) 10 MG tablet Take 10 mg by mouth daily.    Yes [provider]  Cholecalciferol (VITAMIN D-3) 25 MCG (1000 UT) CAPS Take 1,000 Units by mouth daily.   Yes [provider]  docusate sodium 100 MG CAPS Take 100 mg by mouth 2 (two) times  daily. 08/15/13  Yes Babish, Molli HazardMatthew, [name]  famotidine (PEPCID) 20 MG tablet Take 1 tablet (20 mg total) by mouth 2 (two) times daily. 02/21/17  Yes Tyrone NineGrunz, Ryan B, MD  ferrous sulfate (FERROUSUL) 325 (65 FE) MG tablet Take 1 tablet (325 mg total) by mouth 3 (three) times daily with meals. Patient taking differently: Take 325 mg by mouth 2 (two) times daily with a meal.  01/24/17  Yes Babish, Molli HazardMatthew, [name]  gabapentin (NEURONTIN) 400 MG capsule TAKE 1 CAPSULE BY MOUTH THREE TIMES DAILY Patient taking differently: Take 400 mg by mouth 3 (three) times daily.  01/14/17  Yes Gordy SaversKwiatkowski, Peter F, MD  lactulose [facility](CHRONULAC) 10 GM/15ML solution Take 20 g by mouth daily. FOR 5 DAYS 03/19/19 03/24/19 Yes [provider]  metoCLOPramide (REGLAN) 5 MG tablet Take 2.5 mg by mouth 2 (two) times daily. FOR 7 DAYS 03/19/19 03/26/19 Yes [provider]  midodrine (PROAMATINE) 10 MG tablet Take 1 tablet (10 mg total) by mouth 3 (three) times daily. 05/28/17  Yes Tyrone NineGrunz, Ryan B, MD  Multiple Vitamins-Minerals (CERTAGEN PO) Take 1 tablet by mouth daily.    Yes [provider]  Multiple Vitamins-Minerals (PRESERVISION AREDS 2) CAPS Take 1 capsule by mouth 2 (two) times daily.   Yes [provider]  nutrition supplement, JUVEN, (JUVEN) PACK Take 1 packet by mouth 2 (two) times daily between meals. 02/21/17  Yes Tyrone NineGrunz, Ryan B, MD  Nutritional Supplements (RESOURCE 2.0) LIQD Take 120 mLs by mouth at bedtime.   Yes [provider]  OXYGEN Inhale 2  L/min into the lungs as needed (for shortness of breath).    Yes [provider]  polyethylene glycol (MIRALAX / GLYCOLAX) packet Take 17 g by mouth 2 (two) times daily. Patient taking differently: Take 17 g by mouth See admin instructions. Mix 17 grams into 4-8 ounces of liquid and drink two times a day 01/24/17  Yes Babish, Molli HazardMatthew, [name]  potassium chloride (KLOR-CON) 20 MEQ packet Take 20 mEq by mouth daily.   Yes [provider]   PRESCRIPTION MEDICATION BiPAP: At bedtime   Yes [provider]  rivaroxaban (XARELTO) 20 MG TABS tablet Take 1 tablet (20 mg total) by mouth daily. 08/05/15  Yes Hilty, Lisette AbuKenneth C, MD  rosuvastatin (CRESTOR) 20 MG tablet TAKE ONE TABLET BY MOUTH ONCE A WEEK, FRIDAY Patient taking differently: Take 20 mg by mouth every Friday.  12/24/16  Yes Hilty, Lisette AbuKenneth C, MD  simethicone (MYLICON) 125 MG chewable tablet Chew 125 mg by mouth 3 (three) times daily. FOR 5 DAYS 03/30/2019 03/26/19 Yes [provider]  SPIRIVA HANDIHALER 18 MCG inhalation capsule INHALE ONE PUFF BY MOUTH ONCE DAILY Patient taking differently: Place 18 mcg into inhaler and inhale daily.  03/18/16  Yes Gordy SaversKwiatkowski, Peter F, MD  tamsulosin (FLOMAX) 0.4 MG CAPS capsule TAKE 1 CAPSULE BY MOUTH ONCE DAILY Patient taking differently: Take 0.4 mg by mouth daily.  12/24/16  Yes Gordy SaversKwiatkowski, Peter F, MD  traMADol (ULTRAM) 50 MG tablet Take 1-2 tablets (50-100 mg total) by mouth every 12 (twelve) hours as needed for moderate pain or severe pain (and before hydrotherapy). Patient taking differently: Take 50 mg by mouth at bedtime.  05/28/17  Yes Tyrone NineGrunz, Ryan B, MD  Magnesium 250 MG TABS Take 250 mg by mouth daily.     [provider]  Melatonin 3 MG CAPS Take 3 mg by mouth as needed (insomnia).    [provider]  Multiple Vitamin (MULTIVITAMIN WITH MINERALS) TABS tablet Take 1 tablet by mouth daily.    [provider]  protein supplement shake (PREMIER PROTEIN) LIQD Take 325 mLs (11 oz total) by mouth daily. Patient not taking: Reported on 04/03/2019 02/21/17   Tyrone NineGrunz, Ryan B, MD     Vital Signs: BP (!) 90/33 (BP Location: Right Arm)    Pulse 71    Temp (!) 97.4 F (36.3 C) (Oral)    Resp 20    Ht 5\' 10"  (1.778 m)    Wt 206 lb 2.1 oz (93.5 kg)    SpO2 96%    BMI 29.58 kg/m   Physical Exam  NAD, alert Abdomen: soft, non-tender, non-distended.   Back:  Left urinoma drain in place with bloody, purulent  output.  L PCN in place with dark, clear urine.  Imaging: Ct Abdomen Pelvis W Contrast  Result Date: 04/06/2019 CLINICAL DATA:  Abdomen distension EXAM: CT ABDOMEN AND PELVIS WITH CONTRAST TECHNIQUE: Multidetector CT imaging of the abdomen and pelvis was performed using the standard protocol following bolus administration of intravenous contrast. CONTRAST:  100mL OMNIPAQUE IOHEXOL 300 MG/ML  SOLN COMPARISON:  04/02/2019, 01/19/2012, May 09, 2017 FINDINGS: Lower chest: Lung bases demonstrate small pleural effusions. Partial atelectasis within the posterior lower lobes. Cardiomegaly. Hepatobiliary: Subcentimeter hypodensities in the liver too small to further characterize. Small stones in the gallbladder. No biliary dilatation Pancreas: Unremarkable. No pancreatic ductal dilatation or surrounding inflammatory changes. Spleen: Normal in size without focal abnormality. Adrenals/Urinary Tract: Adrenal glands are normal. Complex exophytic cyst mid left kidney measuring 3.7 cm. Multiple stones  within the left renal collecting system, measuring up to 13 mm in size. Subcentimeter hypodensities in the right kidney too small to further characterize. No right hydronephrosis. Moderate hydronephrosis of the left kidney. Urothelial enhancement of the left renal pelvis. The left ureter is difficult to follow. No definitive ureteral stones. Irregular mildly rim enhancing left retroperitoneal fluid collection, extending from left renal hilus to the left upper pelvis. By 8.3 cm oblique craniocaudad. Asymmetric wall thickening of the bladder. Possible small stones within the right dependent bladder. Stomach/Bowel: The stomach is nonenlarged. No significant small bowel distension. Mild diffuse gaseous enlargement of the left colon without definitive transition point to suggest obstruction. No colon wall thickening. Vascular/Lymphatic: Extensive aortic atherosclerosis. No aneurysm. Subcentimeter retroperitoneal nodes.  Reproductive: Prostate is unremarkable. Other: No free air Musculoskeletal: Suspected transitional anatomy. For the purposes of reporting, transitional segment will be lumbarized S1. Acute to subacute marked compression fracture of L4 involving both the anterior and middle columns. No fracture lucency at the posterior neural arch. About 40% loss of height of the vertebral body. Midline sacral decubitus ulcer with bony remodeling/chronic erosion of the sacrococcygeal region IMPRESSION: 1. No significant small bowel distension. Mild gaseous dilatation, predominantly of the distal colon, without well-defined transition to suggest obstruction. Findings are suggestive of an ileus. 2. Moderate left hydronephrosis. Course of left ureter difficult to follow, secondary to an irregular mildly rim enhancing left retroperitoneal/perinephric mildly complex fluid collection that extends from left renal hilus to the upper pelvis. Possible small stones within the dependent right bladder. Findings could be secondary to passed kidney stone, complicated by forniceal rupture and organizing urinoma. Infected fluid collection/retroperitoneal abscess is also a consideration. There are multiple additional stones in the left kidney. The bladder demonstrates asymmetric wall thickening 3. 3.7 cm complex left renal cyst, suggest nonemergent MRI evaluation. 4. Small bilateral pleural effusions and cardiomegaly 5. Gallstones 6. Suspected transitional anatomy of the spine as detailed above. Suspected acute to subacute moderate severe compression fracture L4 involving the anterior and middle columns. Electronically Signed   By: Jasmine Pang M.D.   On: 03/18/2019 19:59   Aas  Result Date: 03/20/2019 CLINICAL DATA:  Abdomen distension EXAM: DG ABDOMEN ACUTE W/ 1V CHEST COMPARISON:  05/28/2017, 05/27/2017 FINDINGS: Single-view chest demonstrate small pleural effusions and basilar atelectasis. Mild cardiomegaly with aortic atherosclerosis.  Prominent central pulmonary vessels, possible arterial hypertension. Supine and upright views of the abdomen demonstrate no free air beneath the diaphragm. Mild to moderate diffuse gaseous enlargement of small and large bowel with scattered rectal gas. Status post left hip replacement. No radiopaque calculi. IMPRESSION: 1. Cardiomegaly with small pleural effusions and basilar atelectasis 2. Mild to moderate diffuse increased small and large bowel gas suggestive of an ileus. Electronically Signed   By: Jasmine Pang M.D.   On: 04/11/2019 16:49   Ir Nephrostomy Placement Left  Result Date: 03/22/2019 INDICATION: 85 year old with left hydronephrosis and fluid collections in the left abdomen along the left ureter. Plan for percutaneous nephrostomy tube placement and drainage of the abdominal fluid collections. EXAM: LEFT PERCUTANEOUS NEPHROSTOMY TUBE PLACEMENT WITH ULTRASOUND AND FLUOROSCOPIC GUIDANCE COMPARISON:  None. MEDICATIONS: Ciprofloxacin 400 mg; The antibiotic was administered in an appropriate time frame prior to skin puncture. ANESTHESIA/SEDATION: Fentanyl 75 mcg IV The patient was continuously monitored during the procedure by the interventional radiology nurse under my direct supervision. CONTRAST:  10 mL Omnipaque 300-administered into the collecting system(s) FLUOROSCOPY TIME:  Fluoroscopy Time: 2 minutes, 6 seconds, 13 mGy COMPLICATIONS: None immediate. PROCEDURE: Informed  written consent was obtained from the patient after a thorough discussion of the procedural risks, benefits and alternatives. All questions were addressed. Maximal Sterile Barrier Technique was utilized including caps, mask, sterile gowns, sterile gloves, sterile drape, hand hygiene and skin antiseptic. A timeout was performed prior to the initiation of the procedure. Patient was placed prone. Ultrasound was used to identify the left kidney. The left flank was prepped and draped in sterile fashion. Skin was anesthetized with 1%  lidocaine. Using ultrasound guidance, 21 gauge needle was directed into a mid/upper pole calyx. Wire was advanced into the collecting system. An Accustick dilator set was placed. Tract was dilated to accommodate a 10.2 [place] multipurpose drain. Fluid was aspirated from the left renal collecting system and specimen was sent for culture. Catheter was sutured to skin and attached to a gravity bag. FINDINGS: Moderate left hydronephrosis. Access obtained from a mid/upper pole calyx. Renal collecting system was decompressed at the end of the procedure. IMPRESSION: Successful placement of left percutaneous nephrostomy tube with ultrasound and fluoroscopic guidance. Urine specimen sent for culture. Electronically Signed   By: Richarda Overlie M.D.   On: 03/22/2019 13:43   Ct Image Guided Drainage Percut Cath  Peritoneal Retroperit  Result Date: 03/22/2019 INDICATION: 85 year old with retroperitoneal complex fluid collection and left hydronephrosis. Patient has already undergone percutaneous left nephrostomy tube placement. Plan for CT-guided drainage of the complex retroperitoneal fluid collection. EXAM: CT-GUIDED LEFT RETROPERITONEAL ABSCESS DRAIN MEDICATIONS: The patient is currently admitted to the hospital and receiving intravenous antibiotics. The antibiotics were administered within an appropriate time frame prior to the initiation of the procedure. ANESTHESIA/SEDATION: Fentanyl 50 mcg The patient was continuously monitored during the procedure by the interventional radiology nurse under my direct supervision. COMPLICATIONS: None immediate. PROCEDURE: Informed written consent was obtained from the patient and daughter after a thorough discussion of the procedural risks, benefits and alternatives. All questions were addressed. A timeout was performed prior to the initiation of the procedure. Patient was placed prone. CT images through the abdomen were obtained. The left retroperitoneal complex fluid collection was  targeted. The left flank was prepped with chlorhexidine and sterile field was created. Skin and soft tissues were anesthetized with 1% lidocaine. Using CT guidance, a 18 gauge trocar needle was directed into the retroperitoneal fluid collection. Brown purulent fluid was aspirated. Stiff Amplatz wire was advanced into the collection. Tract was dilated to accommodate a 10 [place] drain. Additional brown purulent fluid was removed. Follow up CT images were obtained. Catheter was sutured to skin and attached to a suction bulb. FINDINGS: Left kidney has been decompressed with a percutaneous nephrostomy tube. Perinephric and left retroperitoneal complex fluid collection was targeted. The lateral aspect of the collection was punctured and purulent fluid was aspirated. Stiff Amplatz wire advanced into a portion of the collection and a 10 French drain was placed. Follow up CT images demonstrated that the drain may not fully decompress the more medial collections. IMPRESSION: CT-guided placement of a drainage catheter within the complex left retroperitoneal abscess collection. Not clear if the most medial aspect of the abscess will be drained from this catheter. Recommend short-term follow-up imaging to evaluate this drainage catheter and the complex collection. Electronically Signed   By: Richarda Overlie M.D.   On: 03/22/2019 16:54    Labs:  CBC: Recent Labs    04/05/2019 1749 03/22/19 0450  WBC 10.6* 11.4*  HGB 10.9* 10.5*  HCT 34.3* 33.3*  PLT 279 302    COAGS: Recent Labs  03/22/19 0759  INR 1.1    BMP: Recent Labs    03/28/2019 1749 03/22/19 0450  NA 142 141  K 2.9* 3.4*  CL 94* 97*  CO2 37* 37*  GLUCOSE 111* 106*  BUN 22 14  CALCIUM 8.1* 8.2*  CREATININE 0.74 0.62  GFRNONAA >60 >60  GFRAA >60 >60    LIVER FUNCTION TESTS: Recent Labs    03/19/2019 1749  BILITOT 0.4  AST 19  ALT 15  ALKPHOS 64  PROT 5.6*  ALBUMIN 2.0*    Assessment and Plan: Left hydronephrosis s/p percutaneous  nephrostomy tube placement 11/5 by Dr. Roanna Banning s/p drain placement by Dr. Anselm Pancoast 11/5 Patient resting comfortably.  Both drains in place and intact.  Purulent output from urinoma/abscess drain. Cultures positive in urine for Proteus, NGTD in abscess drain, reincubated. CBC pending. Afebrile.  On Rocephin. Continue current management.  IR following.   Patient may need re-imaging in the next few days to assess collections which were found to be complex by CT yesterday and may or may not be sufficiently drained with current abscess drain.   Electronically Signed: Docia Barrier, PA 03/23/2019, 1:27 PM   I spent a total of 15 Minutes at the the patient's bedside AND on the patient's hospital floor or unit, greater than 50% of which was counseling/coordinating care for left hydronephrosis, urinoma.

## 2019-03-23 NOTE — TOC Initial Note (Signed)
Transition of Care [facility]) - Initial/Assessment Note    Patient Details  Name: [name] MRN: [id number] Date of Birth: [birth year]  Transition of Care [facility]) CM/SW Contact:    Gwenlyn Fudge, LCSWA Phone Number: 03/23/2019, 4:18 PM  Clinical Narrative:                  CSW called and spoke with the patient's daughter, [name] CSW introduced herself and explained her role. CSW asked if she would like her father to return to [place]. She stated yes, she is in agreement. Her father is a long-term care resident. She had no other questions or concerns.   CSW will continue to follow and assist with discharge planning.   Expected Discharge Plan: Skilled Nursing Facility Barriers to Discharge: Continued Medical Work up   Patient Goals and CMS Choice Patient states their goals for this hospitalization and ongoing recovery are:: Pt daughter wants him to return to [place] once he is medically ready   Choice offered to / list presented to : NA  Expected Discharge Plan and Services Expected Discharge Plan: Skilled Nursing Facility In-house Referral: Clinical Social Work Discharge Planning Services: NA Post Acute Care Choice: Skilled Nursing Facility Living arrangements for the past 2 months: Skilled Nursing Facility                 DME Arranged: N/A DME Agency: NA       HH Arranged: NA HH Agency: NA        Prior Living Arrangements/Services Living arrangements for the past 2 months: Skilled Nursing Facility Lives with:: Facility Resident Patient language and need for interpreter reviewed:: No Do you feel safe going back to the place where you live?: Yes      Need for Family Participation in Patient Care: No (Comment) Care giver support system in place?: Yes (comment)   Criminal Activity/Legal Involvement Pertinent to Current Situation/Hospitalization: No - Comment as needed  Activities of Daily Living      Permission Sought/Granted Permission sought to share  information with : Case Manager Permission granted to share information with : Yes, Verbal Permission Granted     Permission granted to share info w AGENCY: [facility]        Emotional Assessment Appearance:: Appears stated age Attitude/Demeanor/Rapport: Unable to Assess Affect (typically observed): Unable to Assess Orientation: : Oriented to Self, Oriented to Place, Oriented to  Time, Oriented to Situation Alcohol / Substance Use: Not Applicable Psych Involvement: No (comment)  Admission diagnosis:  Perinephric fluid collection [N28.89] Abdominal mass, unspecified abdominal location [R19.00] Patient Active Problem List   Diagnosis Date Noted  . Perinephric fluid collection 03/23/2019  . Ileus (HCC) 03/20/2019  . Closed compression fracture of L4 vertebra (HCC) 04/05/2019  . Sacral pressure ulcer 03/27/2019  . Renal cyst, left 03/20/2019  . Acute encephalopathy 05/14/2017  . Permanent atrial fibrillation (HCC) 05/14/2017  . Chronic respiratory failure with hypoxia (HCC) 05/14/2017  . Chronic combined systolic and diastolic CHF (congestive heart failure) (HCC) 05/14/2017  . 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 Infected pressure ulcer 02/07/2017  . Hypokalemia 02/07/2017  . Normocytic anemia 02/07/2017  . AF (paroxysmal atrial fibrillation) (HCC) 01/31/2017  . Polyneuropathy 01/31/2017  . S/P left TKA 01/24/2017  . Non-ischemic cardiomyopathy (HCC) 10/15/2016  . Chronic diastolic CHF (congestive heart failure) (HCC) 05/28/2016  . Orthostatic hypotension 09/20/2015  . Impaired glucose tolerance 11/25/2014  . Bifascicular block 12/18/2013  . Bilateral edema of lower extremity 10/25/2013  . S/P left  THA, AA 08/13/2013  . Chronic anticoagulation 12/06/2012  . General weakness 07/05/2012  . Acute diastolic CHF (congestive heart failure) ([place]) 01/22/2012  . Hypotension 01/18/2012  . Paroxysmal Atrial fibrillation - admitted with RVR 01/18/2012  . DJD (degenerative joint  disease), lumbar 01/18/2012  . Hx of colonic polyps 11/02/2011  . BENIGN PROSTATIC HYPERTROPHY 05/13/2010  . SEBORRHEA CAPITIS 02/13/2010  . CHRONIC RHINITIS 06/02/2009  . Hyperlipidemia 11/01/2008  . INSOMNIA, CHRONIC, MILD 01/02/2008  . Essential hypertension 02/06/2007  . Asthma 02/06/2007   PCP:  System, Pcp Not In Pharmacy:   [place], [place] - [address] [place] [address] Phone: [phone] Fax: [phone]     Social Determinants of Health (SDOH) Interventions    Readmission Risk Interventions No flowsheet data found.

## 2019-03-24 LAB — BASIC METABOLIC PANEL
Anion gap: 8 (ref 5–15)
BUN: 15 mg/dL (ref 8–23)
CO2: 35 mmol/L — ABNORMAL HIGH (ref 22–32)
Calcium: 8.4 mg/dL — ABNORMAL LOW (ref 8.9–10.3)
Chloride: 100 mmol/L (ref 98–111)
Creatinine, Ser: 0.53 mg/dL — ABNORMAL LOW (ref 0.61–1.24)
GFR calc Af Amer: >60 mL/min (ref >60)
GFR calc non Af Amer: >60 mL/min (ref >60)
Glucose, Bld: 93 mg/dL (ref 70–99)
Potassium: 3.8 mmol/L (ref 3.5–5.1)
Sodium: 143 mmol/L (ref 135–145)

## 2019-03-24 LAB — CBC
HCT: 31.3 % — ABNORMAL LOW (ref 39.0–52.0)
Hemoglobin: 9.7 g/dL — ABNORMAL LOW (ref 13.0–17.0)
MCH: 31.2 pg (ref 26.0–34.0)
MCHC: 31 g/dL (ref 30.0–36.0)
MCV: 100.6 fL — ABNORMAL HIGH (ref 80.0–100.0)
Platelets: 341 10*3/uL (ref 150–400)
RBC: 3.11 MIL/uL — ABNORMAL LOW (ref 4.22–5.81)
RDW: 14 % (ref 11.5–15.5)
WBC: 7.6 10*3/uL (ref 4.0–10.5)
nRBC: 0 % (ref 0.0–0.2)

## 2019-03-24 LAB — HEPARIN LEVEL (UNFRACTIONATED)
Heparin Unfractionated: <0.1 IU/mL — ABNORMAL LOW (ref 0.30–0.70)
Heparin Unfractionated: <0.1 IU/mL — ABNORMAL LOW (ref 0.30–0.70)

## 2019-03-24 LAB — APTT: aPTT: 36 seconds (ref 24–36)

## 2019-03-24 MED ORDER — HEPARIN (PORCINE) 25000 UT/250ML-% IV SOLN
1950.0000 [IU]/h | INTRAVENOUS | Status: DC
  Administered 2019-03-24: 1350 [IU]/h via INTRAVENOUS
  Administered 2019-03-25: 07:00:00 1550 [IU]/h via INTRAVENOUS
  Administered 2019-03-25 – 2019-03-28 (×5): 1950 [IU]/h via INTRAVENOUS
  Filled 2019-03-24 (×8): qty 250

## 2019-03-24 NOTE — Progress Notes (Addendum)
ANTICOAGULATION CONSULT NOTE - Initial Consult  Pharmacy Consult for heparin Indication: atrial fibrillation  Allergies  Allergen Reactions  . Nsaids Other (See Comments)    Stomach pain  . Morphine And Related Nausea And Vomiting and Other (See Comments)    Dizziness, light-headedness and "Opiates cause tightness in chest"  . Vicodin [Hydrocodone-Acetaminophen] Anxiety    Patient Measurements: Height: 5\' 10"  (177.8 cm) Weight: 211 lb 6.7 oz (95.9 kg) IBW/kg (Calculated) : 73 Heparin Dosing Weight: 92.3kg   Vital Signs: Temp: 99.4 F (37.4 C) (11/07 1114) Temp Source: Oral (11/07 1114) BP: 98/35 (11/07 1114) Pulse Rate: 65 (11/07 1114)  Labs: Recent Labs    04-11-19 1749 03/22/19 0450 03/22/19 0759 03/24/19 0349  HGB 10.9* 10.5*  --  9.7*  HCT 34.3* 33.3*  --  31.3*  PLT 279 302  --  341  LABPROT  --   --  14.3  --   INR  --   --  1.1  --   CREATININE 0.74 0.62  --  0.53*    Estimated Creatinine Clearance: 72.8 mL/min (A) (by C-G formula based on SCr of 0.53 mg/dL (L)).   Medical History: Past Medical History:  Diagnosis Date  . A-fib ([place])   . Acute systolic CHF (congestive heart failure), NYHA class 3 -- Unclear etilogy (? Afib related) EF down from 60-70% to 40%. 07/05/2012   Echo 2/21: LV upper limits of normal. EF- 40%. Cannot assess Diastolic function - Aortic Sclerosis, Mod-Severe Left Atrial dilation; Mild RV & RA dilation; Moderately elevated PA peak pressure: 28mm Hg     . Arthritis    knees  . Asthma   . ASTHMA 02/06/2007   Qualifier: Diagnosis of  By: Rogue Bussing CMA, Maryann Alar    . BENIGN PROSTATIC HYPERTROPHY 05/13/2010   Qualifier: Diagnosis of  By: Burnice Logan  MD, Doretha Sou   . Bifascicular block 12/18/2013  . BPH (benign prostatic hypertrophy)   . Bronchospasm, exercise-induced   . CHF (congestive heart failure) (HCC)    systolic, EF 18% (2/99/3716)  . Chronic kidney disease    stabilized, due to infection  . Chronic rhinitis 06/02/2009   Qualifier: Diagnosis of  By: Arnoldo Morale MD, Balinda Quails   . DJD (degenerative joint disease), lumbar 01/18/2012  . Dyslipidemia   . Edema    Bilateral - left lower leg greater than right- wears compression stockings  . General weakness 07/05/2012  . GERD (gastroesophageal reflux disease)   . History of elevated glucose 10/01/2009   Qualifier: Diagnosis of  By: Arnoldo Morale MD, Balinda Quails   . Hx of colonic polyps 11/02/2011  . Hypertension   . INSOMNIA, CHRONIC, MILD 01/02/2008   Qualifier: Diagnosis of  By: Arnoldo Morale MD, Balinda Quails   . Macular degeneration 08-06-13   bilateral -sight impaired  . Obesity   . Paroxysmal Atrial fibrillation - admitted with RVR 01/18/2012  . S/P left THA, AA 08/13/2013  . Shortness of breath     Medications:  Scheduled:  . amiodarone  200 mg Oral Daily  . famotidine  20 mg Oral BID  . feeding supplement (PRO-STAT SUGAR FREE 64)  30 mL Oral BID WC  . gabapentin  400 mg Oral TID  . midodrine  10 mg Oral TID WC  . multivitamin with minerals  1 tablet Oral Daily  . nutrition supplement (JUVEN)  1 packet Oral BID BM  . potassium chloride  20 mEq Oral Daily  . Ensure Max Protein  11 oz Oral QHS  .  sodium chloride flush  3 mL Intravenous Q12H  . sodium chloride flush  3 mL Intravenous Q12H  . sodium chloride flush  5 mL Intracatheter Q8H  . umeclidinium bromide  1 puff Inhalation Daily   Infusions:  . sodium chloride 250 mL (03/23/19 1611)  . cefTRIAXone (ROCEPHIN)  IV 2 g (03/23/19 1612)    Assessment: 85 yo male admitted on 2019-04-10 with abdominal pain, constipation, and watery stools found to have peinephric fluid collected requiring nephrostomy tube. Patient is on Xarelto PTA for Afib which was held on admission for possible surgery. Pharmacy has been consulted to dose heparin for Afib. CHAD2-VASc score of 3 (points for age and gender). CBC stable. No reported bleeding.   Last dose of rivaroxaban on Apr 10, 2019 AM. Will obtain baseline aPTT and heparin level and if not  correlated will monitor efficacy based on aPTT until correlated due to DOACs falsely elevating heparin levels.  Goal of Therapy:  Heparin level 0.3-0.7 units/ml Monitor platelets by anticoagulation protocol: Yes   Plan:  Obtain baseline aPTT and heparin level  Start heparin 1350 units/hr  Will not bolus given patient has not received anticoagulation since admission and CHAD2-VASc score or 3 Obtain heparin level and aPTT at 2130 Monitor daily heparin level, aPTT, CBC, and S/S of bleeding   Addendum: baseline heparin level undetectable will monitor efficacy by heparin levels and cancel aPTT monitoring   Gerrit Halls, PharmD PGY1 Pharmacy Resident [employer]: [phone]  03/24/2019,12:26 PM

## 2019-03-24 NOTE — Progress Notes (Signed)
ANTICOAGULATION CONSULT NOTE - [place] for heparin Indication: atrial fibrillation  Allergies  Allergen Reactions  . Nsaids Other (See Comments)    Stomach pain  . Morphine And Related Nausea And Vomiting and Other (See Comments)    Dizziness, light-headedness and "Opiates cause tightness in chest"  . Vicodin [Hydrocodone-Acetaminophen] Anxiety    Patient Measurements: Height: 5\' 10"  (177.8 cm) Weight: 211 lb 6.7 oz (95.9 kg) IBW/kg (Calculated) : 73 Heparin Dosing Weight: 92.3kg   Vital Signs: Temp: 98.1 F (36.7 C) (11/07 1941) Temp Source: Oral (11/07 1941) BP: 100/40 (11/07 2105) Pulse Rate: 66 (11/07 2105)  Labs: Recent Labs    03/22/19 0450 03/22/19 0759 03/24/19 0349 03/24/19 1241 03/24/19 2109  HGB 10.5*  --  9.7*  --   --   HCT 33.3*  --  31.3*  --   --   PLT 302  --  341  --   --   APTT  --   --   --  36  --   LABPROT  --  14.3  --   --   --   INR  --  1.1  --   --   --   HEPARINUNFRC  --   --   --  <0.10* <0.10*  CREATININE 0.62  --  0.53*  --   --     Estimated Creatinine Clearance: 72.8 mL/min (A) (by C-G formula based on SCr of 0.53 mg/dL (L)).   Medical History: Past Medical History:  Diagnosis Date  . A-fib ([place])   . Acute systolic CHF (congestive heart failure), NYHA class 3 -- Unclear etilogy (? Afib related) EF down from 60-70% to 40%. 07/05/2012   Echo 2/21: LV upper limits of normal. EF- 40%. Cannot assess Diastolic function - Aortic Sclerosis, Mod-Severe Left Atrial dilation; Mild RV & RA dilation; Moderately elevated PA peak pressure: 38mm Hg     . Arthritis    knees  . Asthma   . ASTHMA 02/06/2007   Qualifier: Diagnosis of  By: Rogue Bussing CMA, Maryann Alar    . BENIGN PROSTATIC HYPERTROPHY 05/13/2010   Qualifier: Diagnosis of  By: Burnice Logan  MD, Doretha Sou   . Bifascicular block 12/18/2013  . BPH (benign prostatic hypertrophy)   . Bronchospasm, exercise-induced   . CHF (congestive heart failure) (HCC)    systolic, EF 64% (08/17/4740)  . Chronic kidney disease    stabilized, due to infection  . Chronic rhinitis 06/02/2009   Qualifier: Diagnosis of  By: Arnoldo Morale MD, Balinda Quails   . DJD (degenerative joint disease), lumbar 01/18/2012  . Dyslipidemia   . Edema    Bilateral - left lower leg greater than right- wears compression stockings  . General weakness 07/05/2012  . GERD (gastroesophageal reflux disease)   . History of elevated glucose 10/01/2009   Qualifier: Diagnosis of  By: Arnoldo Morale MD, Balinda Quails   . Hx of colonic polyps 11/02/2011  . Hypertension   . INSOMNIA, CHRONIC, MILD 01/02/2008   Qualifier: Diagnosis of  By: Arnoldo Morale MD, Balinda Quails   . Macular degeneration 08-06-13   bilateral -sight impaired  . Obesity   . Paroxysmal Atrial fibrillation - admitted with RVR 01/18/2012  . S/P left THA, AA 08/13/2013  . Shortness of breath     Medications:  Scheduled:  . amiodarone  200 mg Oral Daily  . famotidine  20 mg Oral BID  . feeding supplement (PRO-STAT SUGAR FREE 64)  30 mL Oral BID WC  .  gabapentin  400 mg Oral TID  . midodrine  10 mg Oral TID WC  . multivitamin with minerals  1 tablet Oral Daily  . nutrition supplement (JUVEN)  1 packet Oral BID BM  . potassium chloride  20 mEq Oral Daily  . Ensure Max Protein  11 oz Oral QHS  . sodium chloride flush  3 mL Intravenous Q12H  . sodium chloride flush  3 mL Intravenous Q12H  . sodium chloride flush  5 mL Intracatheter Q8H  . umeclidinium bromide  1 puff Inhalation Daily   Infusions:  . sodium chloride 250 mL (03/23/19 1611)  . cefTRIAXone (ROCEPHIN)  IV 2 g (03/24/19 1634)  . heparin 1,350 Units/hr (03/24/19 1334)    Assessment: 85 yo male admitted on 04/16/2019 with abdominal pain, constipation, and watery stools found to have peinephric fluid collected requiring nephrostomy tube. Patient is on Xarelto PTA for Afib which was held on admission for possible surgery. Pharmacy has been consulted to dose heparin for Afib. CHAD2-VASc score of 3  (points for age and gender). CBC stable. No reported bleeding.   Last dose of rivaroxaban on 04/09/2019 AM. Baseline heparin level <0.1 as expected given timing of last rivaroxaban dose.  Will make adjustments using heparin levels.    Heparin level <0.1 on 1350 uits/hr.  Spoke with RN, no IV issues noted.  No bleeding noted.  Goal of Therapy:  Heparin level 0.3-0.7 units/ml Monitor platelets by anticoagulation protocol: Yes   Plan:  Increase heparin to 1550 units/hr  Next heparin level 11/8 at 0700 Monitor daily heparin level, aPTT, CBC, and S/S of bleeding   [employer], Pharm.D., BCPS Clinical Pharmacist  **Pharmacist phone directory can now be found on [email or web address] ([email or web address]).  Listed under [facility].  03/24/2019 10:53 PM

## 2019-03-24 NOTE — Consult Note (Signed)
H&P Physician requesting consult: Ankit Amin  Chief Complaint: Left perinephric fluid collection, left hydronephrosis  History of Present Illness: 85 year old male initially presented on 03/18/2019.  He had some abdominal pain and distention.  He underwent a CT scan of the abdomen and pelvis that revealed left hydronephrosis.  He also had a large perinephric fluid collection thought to be either a urinoma from a forniceal rupture versus perinephric abscess.  There were also multiple stones within the collecting system close to the renal pelvis.  On 03/22/2019, he underwent a left nephrostomy tube placement followed by a 10 French drain placement in the fluid collection.  Thus far, there has been no bacterial growth from the fluid collection drainage.  He was noted to have Proteus in the urine however.  Sensitivities are pending.  He is currently on ceftriaxone.  Unfortunately, nursing has not been charting output from either drain over the past 24 hours.  I have asked them to now.  He has multiple medical comorbidities including atrial fibrillation on Xarelto, CHF, asthma/COPD, hypotension on midodrine, chronic sacral ulcer.  Past Medical History:  Diagnosis Date  . A-fib (HCC)   . Acute systolic CHF (congestive heart failure), NYHA class 3 -- Unclear etilogy (? Afib related) EF down from 60-70% to 40%. 07/05/2012   Echo 2/21: LV upper limits of normal. EF- 40%. Cannot assess Diastolic function - Aortic Sclerosis, Mod-Severe Left Atrial dilation; Mild RV & RA dilation; Moderately elevated PA peak pressure: 50mm Hg     . Arthritis    knees  . Asthma   . ASTHMA 02/06/2007   Qualifier: Diagnosis of  By: Claiborne Billings CMA, Terance Ice    . BENIGN PROSTATIC HYPERTROPHY 05/13/2010   Qualifier: Diagnosis of  By: Amador Cunas  MD, Janett Labella   . Bifascicular block 12/18/2013  . BPH (benign prostatic hypertrophy)   . Bronchospasm, exercise-induced   . CHF (congestive heart failure) (HCC)    systolic, EF 40%  (07/07/2012)  . Chronic kidney disease    stabilized, due to infection  . Chronic rhinitis 06/02/2009   Qualifier: Diagnosis of  By: Lovell Sheehan MD, Balinda Quails   . DJD (degenerative joint disease), lumbar 01/18/2012  . Dyslipidemia   . Edema    Bilateral - left lower leg greater than right- wears compression stockings  . General weakness 07/05/2012  . GERD (gastroesophageal reflux disease)   . History of elevated glucose 10/01/2009   Qualifier: Diagnosis of  By: Lovell Sheehan MD, Balinda Quails   . Hx of colonic polyps 11/02/2011  . Hypertension   . INSOMNIA, CHRONIC, MILD 01/02/2008   Qualifier: Diagnosis of  By: Lovell Sheehan MD, Balinda Quails   . Macular degeneration 08-06-13   bilateral -sight impaired  . Obesity   . Paroxysmal Atrial fibrillation - admitted with RVR 01/18/2012  . S/P left THA, AA 08/13/2013  . Shortness of breath    Past Surgical History:  Procedure Laterality Date  . CARDIOVERSION N/A 07/07/2012   Procedure: CARDIOVERSION;  Surgeon: Chrystie Nose, MD;  Location: [facility] ENDOSCOPY;  Service: Cardiovascular;  Laterality: N/A;  . CARDIOVERSION N/A 08/30/2012   Procedure: CARDIOVERSION;  Surgeon: Chrystie Nose, MD;  Location: [facility] ENDOSCOPY;  Service: Cardiovascular;  Laterality: N/A;  . IR FLUORO GUIDE CV LINE RIGHT  04/15/2017  . IR NEPHROSTOMY PLACEMENT LEFT  03/22/2019  . IR US GUIDE VASC ACCESS RIGHT  04/15/2017  . IRRIGATION AND DEBRIDEMENT ABSCESS N/A 02/08/2017   Procedure: IRRIGATION AND DEBRIDEMENT SACRAL ULCER;  Surgeon: Berna Bue, MD;  Location: [facility] ORS;  Service: General;  Laterality: N/A;  . TOTAL HIP ARTHROPLASTY Left 08/13/2013   Procedure: LEFT TOTAL HIP ARTHROPLASTY ANTERIOR APPROACH;  Surgeon: Shelda PalMatthew D Olin, MD;  Location: [facility] ORS;  Service: Orthopedics;  Laterality: Left;  . TOTAL KNEE ARTHROPLASTY Left 01/24/2017   Procedure: LEFT TOTAL KNEE ARTHROPLASTY;  Surgeon: Durene Romanslin, Matthew, MD;  Location: [facility] ORS;  Service: Orthopedics;  Laterality: Left;  90 mins  . TRANSTHORACIC ECHOCARDIOGRAM   07/07/2012   ef 40%; mild MR; LA mod-severely dilated; RA mildly dilated; RV systolic function mildly reduced; RV systolic pressure increase - mod pulm htn  . VASCULAR SURGERY  removed varicose veins  . VASECTOMY    . viritual colonoscopy  04/2017   Dr Marca AnconaKarki    Home Medications:  Medications Prior to Admission  Medication Sig Dispense Refill Last Dose  . acetaminophen (TYLENOL) 500 MG tablet Take 2 tablets (1,000 mg total) by mouth every 8 (eight) hours. (Patient taking differently: Take 1,000 mg by mouth See admin instructions. Take 1,000 mg by mouth every eight hours and do not exceed 4,000 mg/24 hours from all combined sources) 30 tablet 0 03/20/2019 at 1400  . albuterol (PROVENTIL HFA;VENTOLIN HFA) 108 (90 Base) MCG/ACT inhaler Inhale 2 puffs into the lungs every 4 (four) hours as needed for wheezing or shortness of breath.    [facility] at [employer] [facility]  . Amino Acids-Protein Hydrolys (FEEDING SUPPLEMENT, PRO-STAT SUGAR FREE 64,) LIQD Take 30 mLs by mouth 3 (three) times daily.    03/31/2019 at 1400  . amiodarone (PACERONE) 200 MG tablet TAKE ONE TABLET BY MOUTH ONCE DAILY (Patient taking differently: Take 200 mg by mouth daily. ) 90 tablet 3 03/25/2019 at 0900  . Artificial Saliva (BIOTENE DRY MOUTH MOISTURIZING) SOLN 1 spray by Mouth Rinse route every 2 (two) hours as needed (for dryness).   unk at [employer] [facility]  . [EXPIRED] bisacodyl (DULCOLAX) 10 MG suppository Place 10 mg rectally daily. FOR 3 DAYS   03/20/2019 at 2100  . bumetanide (BUMEX) 1 MG tablet Take 1 tablet (1 mg total) by mouth 2 (two) times daily. 60 tablet 5 04/07/2019 at 0900  . cetirizine (ZYRTEC) 10 MG tablet Take 10 mg by mouth daily.    03/22/2019 at 0900  . Cholecalciferol (VITAMIN D-3) 25 MCG (1000 UT) CAPS Take 1,000 Units by mouth daily.   03/19/2019 at 0900  . docusate sodium 100 MG CAPS Take 100 mg by mouth 2 (two) times daily. 10 capsule 0 04/14/2019 at 0900  . famotidine (PEPCID) 20 MG tablet Take 1 tablet (20 mg total) by mouth 2 (two) times  daily.   03/20/2019 at 1700  . ferrous sulfate (FERROUSUL) 325 (65 FE) MG tablet Take 1 tablet (325 mg total) by mouth 3 (three) times daily with meals. (Patient taking differently: Take 325 mg by mouth 2 (two) times daily with a meal. )  3 03/27/2019 at 0900  . gabapentin (NEURONTIN) 400 MG capsule TAKE 1 CAPSULE BY MOUTH THREE TIMES DAILY (Patient taking differently: Take 400 mg by mouth 3 (three) times daily. ) 270 capsule 1 04/12/2019 at 0900  . lactulose (CHRONULAC) 10 GM/15ML solution Take 20 g by mouth daily. FOR 5 DAYS   03/19/2019 at 1700  . metoCLOPramide (REGLAN) 5 MG tablet Take 2.5 mg by mouth 2 (two) times daily. FOR 7 DAYS   03/20/2019 at 1700  . midodrine (PROAMATINE) 10 MG tablet Take 1 tablet (10 mg total) by mouth 3 (three) times daily. 90 tablet 2 03/20/2019  at 1200  . Multiple Vitamins-Minerals (CERTAGEN PO) Take 1 tablet by mouth daily.    03/27/2019 at 0900  . Multiple Vitamins-Minerals (PRESERVISION AREDS 2) CAPS Take 1 capsule by mouth 2 (two) times daily.   04/15/2019 at 0900  . nutrition supplement, JUVEN, (JUVEN) PACK Take 1 packet by mouth 2 (two) times daily between meals.  0 03/30/2019 at 0900  . Nutritional Supplements (RESOURCE 2.0) LIQD Take 120 mLs by mouth at bedtime.   03/20/2019 at 2100  . OXYGEN Inhale 2 L/min into the lungs as needed (for shortness of breath).    03/20/2019 at Unknown time  . polyethylene glycol (MIRALAX / GLYCOLAX) packet Take 17 g by mouth 2 (two) times daily. (Patient taking differently: Take 17 g by mouth See admin instructions. Mix 17 grams into 4-8 ounces of liquid and drink two times a day) 14 each 0 03/20/2019 at 0900  . potassium chloride (KLOR-CON) 20 MEQ packet Take 20 mEq by mouth daily.   04/08/2019 at 0900  . PRESCRIPTION MEDICATION BiPAP: At bedtime   03/20/2019 at 2000  . rivaroxaban (XARELTO) 20 MG TABS tablet Take 1 tablet (20 mg total) by mouth daily. 21 tablet 0 04/08/2019 at 0900  . rosuvastatin (CRESTOR) 20 MG tablet TAKE ONE TABLET BY  MOUTH ONCE A WEEK, FRIDAY (Patient taking differently: Take 20 mg by mouth every Friday. ) 12 tablet 2 03/16/2019 at 0800  . [EXPIRED] senna (SENOKOT) 8.6 MG TABS tablet Take 2 tablets by mouth 2 (two) times daily. FOR 7 DAYS   04/04/2019 at 0900  . simethicone (MYLICON) 125 MG chewable tablet Chew 125 mg by mouth 3 (three) times daily. FOR 5 DAYS   04/07/2019 at 1200  . SPIRIVA HANDIHALER 18 MCG inhalation capsule INHALE ONE PUFF BY MOUTH ONCE DAILY (Patient taking differently: Place 18 mcg into inhaler and inhale daily. ) 90 capsule 3 04/09/2019 at 0900  . tamsulosin (FLOMAX) 0.4 MG CAPS capsule TAKE 1 CAPSULE BY MOUTH ONCE DAILY (Patient taking differently: Take 0.4 mg by mouth daily. ) 30 capsule 5 04/11/2019 at 0900  . traMADol (ULTRAM) 50 MG tablet Take 1-2 tablets (50-100 mg total) by mouth every 12 (twelve) hours as needed for moderate pain or severe pain (and before hydrotherapy). (Patient taking differently: Take 50 mg by mouth at bedtime. ) 12 tablet 0 03/20/2019 at 2100  . Magnesium 250 MG TABS Take 250 mg by mouth daily.    Not Taking at Unknown time  . Melatonin 3 MG CAPS Take 3 mg by mouth as needed (insomnia).   Not Taking at Unknown time  . Multiple Vitamin (MULTIVITAMIN WITH MINERALS) TABS tablet Take 1 tablet by mouth daily.   Not Taking at Unknown time  . protein supplement shake (PREMIER PROTEIN) LIQD Take 325 mLs (11 oz total) by mouth daily. (Patient not taking: Reported on 03/28/2019)  0 Not Taking at Unknown time   Allergies:  Allergies  Allergen Reactions  . Nsaids Other (See Comments)    Stomach pain  . Morphine And Related Nausea And Vomiting and Other (See Comments)    Dizziness, light-headedness and "Opiates cause tightness in chest"  . Vicodin [Hydrocodone-Acetaminophen] Anxiety    Family History  Problem Relation Age of Onset  . Alzheimer's disease Mother   . Heart disease Father   . Colon cancer Neg Hx   . Esophageal cancer Neg Hx    Social History:  reports  that he has quit smoking. He has never used smokeless tobacco. He  reports that he does not drink alcohol or use drugs.  ROS: A complete review of systems was performed.  All systems are negative except for pertinent findings as noted. ROS   Physical Exam:  Vital signs in last 24 hours: Temp:  [97.5 F (36.4 C)-98.6 F (37 C)] 98.6 F (37 C) (11/07 0742) Pulse Rate:  [63-71] 68 (11/07 0742) Resp:  [20] 20 (11/07 0742) BP: (84-106)/(35-48) 84/35 (11/07 0742) SpO2:  [90 %-97 %] 96 % (11/07 0742) Weight:  [95.9 kg] 95.9 kg (11/07 0500) General:  Alert and oriented, No acute distress HEENT: Normocephalic, atraumatic Neck: No JVD or lymphadenopathy Cardiovascular: Regular rate and rhythm Lungs: Regular rate and effort Abdomen: Soft, nontender, nondistended, no abdominal masses Back: No CVA tenderness.  Left-sided nephrostomy tube in place draining clear yellow urine.  Left-sided drain in place draining serosanguineous Extremities: No edema Neurologic: Grossly intact  Laboratory Data:  Results for orders placed or performed during the hospital encounter of 03/20/2019 (from the past 24 hour(s))  CBC     Status: Abnormal   Collection Time: 03/24/19  3:49 AM  Result Value Ref Range   WBC 7.6 4.0 - 10.5 K/uL   RBC 3.11 (L) 4.22 - 5.81 MIL/uL   Hemoglobin 9.7 (L) 13.0 - 17.0 g/dL   HCT 31.3 (L) 39.0 - 52.0 %   MCV 100.6 (H) 80.0 - 100.0 fL   MCH 31.2 26.0 - 34.0 pg   MCHC 31.0 30.0 - 36.0 g/dL   RDW 14.0 11.5 - 15.5 %   Platelets 341 150 - 400 K/uL   nRBC 0.0 0.0 - 0.2 %  Basic metabolic panel     Status: Abnormal   Collection Time: 03/24/19  3:49 AM  Result Value Ref Range   Sodium 143 135 - 145 mmol/L   Potassium 3.8 3.5 - 5.1 mmol/L   Chloride 100 98 - 111 mmol/L   CO2 35 (H) 22 - 32 mmol/L   Glucose, Bld 93 70 - 99 mg/dL   BUN 15 8 - 23 mg/dL   Creatinine, Ser 0.53 (L) 0.61 - 1.24 mg/dL   Calcium 8.4 (L) 8.9 - 10.3 mg/dL   GFR calc non Af Amer >60 >60 mL/min   GFR calc Af  Amer >60 >60 mL/min   Anion gap 8 5 - 15   Recent Results (from the past 240 hour(s))  SARS CORONAVIRUS 2 (TAT 6-24 HRS) Nasopharyngeal Nasopharyngeal Swab     Status: None   Collection Time: 04/04/2019  5:54 PM   Specimen: Nasopharyngeal Swab  Result Value Ref Range Status   SARS Coronavirus 2 NEGATIVE NEGATIVE Final    Comment: (NOTE) SARS-CoV-2 target nucleic acids are NOT DETECTED. The SARS-CoV-2 RNA is generally detectable in upper and lower respiratory specimens during the acute phase of infection. Negative results do not preclude SARS-CoV-2 infection, do not rule out co-infections with other pathogens, and should not be used as the sole basis for treatment or other patient management decisions. Negative results must be combined with clinical observations, patient history, and epidemiological information. The expected result is Negative. Fact Sheet for Patients: SugarRoll.be Fact Sheet for Healthcare Providers: https://www.woods-mathews.com/ This test is not yet approved or cleared by the [place] FDA and  has been authorized for detection and/or diagnosis of SARS-CoV-2 by FDA under an Emergency Use Authorization (EUA). This EUA will remain  in effect (meaning this test can be used) for the duration of the COVID-19 declaration under Section 56 4(b)(1) of the Act, 21  U.S.C. section 360bbb-3(b)(1), unless the authorization is terminated or revoked sooner. Performed at [facility], [address], [place], [place] [address]   UCx     Status: Abnormal   Collection Time: 03/29/2019  7:43 PM   Specimen: Urine, Random  Result Value Ref Range Status   Specimen Description URINE, RANDOM  Final   Special Requests NONE  Final   Culture (A)  Final    <10,000 COLONIES/mL INSIGNIFICANT GROWTH Performed at [facility], [address] [place], [place] [address]    Report Status 03/22/2019 FINAL  Final  Urine culture      Status: Abnormal (Preliminary result)   Collection Time: 03/22/19 11:20 AM   Specimen: Kidney; Urine  Result Value Ref Range Status   Specimen Description KIDNEY LEFT  Final   Special Requests NONE  Final   Culture (A)  Final    100 COLONIES/mL PROTEUS MIRABILIS CULTURE REINCUBATED FOR BETTER GROWTH Performed at [facility], [address] [place], [place] [address]    Report Status PENDING  Incomplete  Aerobic/Anaerobic Culture (surgical/deep wound)     Status: None (Preliminary result)   Collection Time: 03/22/19  3:14 PM   Specimen: Abscess  Result Value Ref Range Status   Specimen Description ABSCESS  Final   Special Requests LEFT RETROPERITONEAL  Final   Gram Stain   Final    ABUNDANT WBC PRESENT,BOTH PMN AND MONONUCLEAR NO ORGANISMS SEEN    Culture   Final    CULTURE REINCUBATED FOR BETTER GROWTH Performed at [facility], [address], [place], [place] [address]    Report Status PENDING  Incomplete  MRSA PCR Screening     Status: None   Collection Time: 03/22/19  5:09 PM   Specimen: Nasopharyngeal  Result Value Ref Range Status   MRSA by PCR NEGATIVE NEGATIVE Final    Comment:        The GeneXpert MRSA Assay (FDA approved for NASAL specimens only), is one component of a comprehensive MRSA colonization surveillance program. It is not intended to diagnose MRSA infection nor to guide or monitor treatment for MRSA infections. Performed at [facility], [address], [place], [place] [address]    Creatinine: Recent Labs    03/27/2019 1749 03/22/19 0450 03/24/19 0349  CREATININE 0.74 0.62 0.53*   CT scan personally reviewed and is detailed in the history of present illness.  Impression/Assessment:  Left hydronephrosis Left perinephric fluid collection Left nephrolithiasis  Plan:  Continue broad-spectrum antibiotics until sensitivities to the Proteus return.  Continue nephrostomy tube and drain to gravity drainage.  Plan to repeat CT  scan of the abdomen and pelvis on Monday or Tuesday.  He will eventually need a left ureteroscopy/laser lithotripsy, ureteral stent placement.  I do not think he is healthy enough to undergo PCNL.  Ray Church, III 03/24/2019, 10:22 AM

## 2019-03-24 NOTE — Progress Notes (Signed)
PROGRESS NOTE    [name]  ZOX:[id number] DOB: 1926-08-07 DOA: 04/01/2019 PCP: System, Pcp Not In   Brief Narrative:   85 y.o. male with medical history significant foratrial fibrillation on Xarelto, chronic diastolic CHF, asthma/COPD, OSA on BiPAP, hypotension on midodrine, and chronic sacral ulcer, now presenting to the emergency department for evaluation of progressive abdominal distention with alternating constipation and watery stool.  Found to have perinephric fluid collection requiring nephrostomy tube.   Assessment & Plan:   Principal Problem:   Perinephric fluid collection Active Problems:   Asthma   Paroxysmal Atrial fibrillation - admitted with RVR   Chronic diastolic CHF (congestive heart failure) (HCC)   AF (paroxysmal atrial fibrillation) (HCC)   Hypokalemia   Ileus (HCC)   Closed compression fracture of L4 vertebra (HCC)   Sacral pressure ulcer   Renal cyst, left   Abdominal pain secondary to perinephric fluid collection Left-sided hydronephrosis/urinoma/abscess status post drain placement 11/5 -Status post nephrostomy tube -Follow-up culture data -Continue antibiotics-IV Rocephin -Appreciate urology input.  Repeat CT of the abdomen pelvis on Monday/Tuesday. -Resume Xarelto when cleared.  IV heparin in the meantime -Reimaging few days to reassess the collection  L4 compression fracture, acute/subacute -Denies any recent fall or trauma.  Pain control if it becomes necessary.  Bowel regimen.  No neurologic compromise at this time -Supportive care.  Follow-up outpatient.  Ileus, improved  Chronic atrial fibrillation -Xarelto held prior to the procedure.  If no further procedure plans by urology, will resume Xarelto. -Continue amiodarone -Once normal procedure plan, resume Xarelto  Left renal cyst -Outpatient nonemergent MRI  History of COPD -Not in active exacerbation.  Resume bronchodilators.  BiPAP at bedtime  Chronic sacral pressure ulcer,  stage IV, present on admission -Wound care team following.  Chronic neuropathy, peripheral -Gabapentin 400 mg 3 times daily  DVT prophylaxis: Heparin drip for now.  Eventually resume Xarelto Code Status: Partial code Family Communication: None Disposition Plan: Maintain hospital stay to manage his nephrostomy tube and the drain.  Repeat CT on Monday or Tuesday.  Consultants:   IR  Urology  Procedures:   Nephrostomy placed 11/5  Antimicrobials:   IV Rocephin   Subjective: Sitting up in the bed, no other complaints.  Passing gas without any issues  Review of Systems Otherwise negative except as per HPI, including: General: Denies fever, chills, night sweats or unintended weight loss. Resp: Denies cough, wheezing, shortness of breath. Cardiac: Denies chest pain, palpitations, orthopnea, paroxysmal nocturnal dyspnea. GI: Denies abdominal pain, nausea, vomiting, diarrhea or constipation GU: Denies dysuria, frequency, hesitancy or incontinence MS: Denies muscle aches, joint pain or swelling Neuro: Denies headache, neurologic deficits (focal weakness, numbness, tingling), abnormal gait Psych: Denies anxiety, depression, SI/HI/AVH Skin: Denies new rashes or lesions ID: Denies sick contacts, exotic exposures, travel  Objective: Vitals:   03/23/19 2115 03/24/19 0143 03/24/19 0419 03/24/19 0500  BP: (!) 105/43 (!) 106/48 (!) 91/41   Pulse: 71 69 63   Resp: 20 20 20    Temp: 98.3 F (36.8 C) (!) 97.5 F (36.4 C) (!) 97.5 F (36.4 C)   TempSrc: Oral Oral Oral   SpO2: 97% 90% 93%   Weight:    95.9 kg  Height:        Intake/Output Summary (Last 24 hours) at 03/24/2019 0737 Last data filed at 03/24/2019 0700 Gross per 24 hour  Intake 2301.98 ml  Output 1475 ml  Net 826.98 ml   Filed Weights   03/22/19 1542 03/23/19 0500 03/24/19  0500  Weight: 94.8 kg 93.5 kg 95.9 kg    Examination:  General exam: Not in acute distress.  Appears chronically ill. Respiratory  system: Clear to auscultation bilaterally Cardiovascular system: Normal sinus rhythm Gastrointestinal system: Abdomen is very minimally distended but has positive bowel sounds. Central nervous system: Alert and oriented. No focal neurological deficits. Extremities: Symmetric 4 x 5 power.  No edema Skin: No rashes Psychiatry: Judgement and insight appear normal. Mood & affect appropriate.   Left-sided nephrostomy tube Chronic stage IV sacral ulcer External urinary catheter  Data Reviewed:   CBC: Recent Labs  Lab 03/24/2019 1749 03/22/19 0450 03/24/19 0349  WBC 10.6* 11.4* 7.6  NEUTROABS 7.4 8.7*  --   HGB 10.9* 10.5* 9.7*  HCT 34.3* 33.3* 31.3*  MCV 99.1 100.0 100.6*  PLT 279 302 268   Basic Metabolic Panel: Recent Labs  Lab 04/07/2019 1749 03/22/19 0450 03/24/19 0349  NA 142 141 143  K 2.9* 3.4* 3.8  CL 94* 97* 100  CO2 37* 37* 35*  GLUCOSE 111* 106* 93  BUN 22 14 15   CREATININE 0.74 0.62 0.53*  CALCIUM 8.1* 8.2* 8.4*  MG  --  2.1  --    GFR: Estimated Creatinine Clearance: 72.8 mL/min (A) (by C-G formula based on SCr of 0.53 mg/dL (L)). Liver Function Tests: Recent Labs  Lab 04/09/2019 1749  AST 19  ALT 15  ALKPHOS 64  BILITOT 0.4  PROT 5.6*  ALBUMIN 2.0*   Recent Labs  Lab 04/11/2019 1749  LIPASE 20   No results for input(s): AMMONIA in the last 168 hours. Coagulation Profile: Recent Labs  Lab 03/22/19 0759  INR 1.1   Cardiac Enzymes: No results for input(s): CKTOTAL, CKMB, CKMBINDEX, TROPONINI in the last 168 hours. BNP (last 3 results) No results for input(s): PROBNP in the last 8760 hours. HbA1C: No results for input(s): HGBA1C in the last 72 hours. CBG: No results for input(s): GLUCAP in the last 168 hours. Lipid Profile: No results for input(s): CHOL, HDL, LDLCALC, TRIG, CHOLHDL, LDLDIRECT in the last 72 hours. Thyroid Function Tests: No results for input(s): TSH, T4TOTAL, FREET4, T3FREE, THYROIDAB in the last 72 hours. Anemia Panel: No  results for input(s): VITAMINB12, FOLATE, FERRITIN, TIBC, IRON, RETICCTPCT in the last 72 hours. Sepsis Labs: Recent Labs  Lab 04/01/2019 1749  LATICACIDVEN 0.8    Recent Results (from the past 240 hour(s))  SARS CORONAVIRUS 2 (TAT 6-24 HRS) Nasopharyngeal Nasopharyngeal Swab     Status: None   Collection Time: 04/11/2019  5:54 PM   Specimen: Nasopharyngeal Swab  Result Value Ref Range Status   SARS Coronavirus 2 NEGATIVE NEGATIVE Final    Comment: (NOTE) SARS-CoV-2 target nucleic acids are NOT DETECTED. The SARS-CoV-2 RNA is generally detectable in upper and lower respiratory specimens during the acute phase of infection. Negative results do not preclude SARS-CoV-2 infection, do not rule out co-infections with other pathogens, and should not be used as the sole basis for treatment or other patient management decisions. Negative results must be combined with clinical observations, patient history, and epidemiological information. The expected result is Negative. Fact Sheet for Patients: SugarRoll.be Fact Sheet for Healthcare Providers: https://www.woods-mathews.com/ This test is not yet approved or cleared by the [place] FDA and  has been authorized for detection and/or diagnosis of SARS-CoV-2 by FDA under an Emergency Use Authorization (EUA). This EUA will remain  in effect (meaning this test can be used) for the duration of the COVID-19 declaration under Section 56 4(b)(1)  of the Act, 21 U.S.C. section 360bbb-3(b)(1), unless the authorization is terminated or revoked sooner. Performed at [facility], [address] [place], [place] [address]   UCx     Status: Abnormal   Collection Time: 04-19-2019  7:43 PM   Specimen: Urine, Random  Result Value Ref Range Status   Specimen Description URINE, RANDOM  Final   Special Requests NONE  Final   Culture (A)  Final    <10,000 COLONIES/mL INSIGNIFICANT GROWTH Performed at [facility], [address], [place], [place] [address]    Report Status 03/22/2019 FINAL  Final  Urine culture     Status: Abnormal (Preliminary result)   Collection Time: 03/22/19 11:20 AM   Specimen: Kidney; Urine  Result Value Ref Range Status   Specimen Description KIDNEY LEFT  Final   Special Requests NONE  Final   Culture (A)  Final    100 COLONIES/mL PROTEUS MIRABILIS CULTURE REINCUBATED FOR BETTER GROWTH Performed at [facility], [address], [place], [place] [address]    Report Status PENDING  Incomplete  Aerobic/Anaerobic Culture (surgical/deep wound)     Status: None (Preliminary result)   Collection Time: 03/22/19  3:14 PM   Specimen: Abscess  Result Value Ref Range Status   Specimen Description ABSCESS  Final   Special Requests LEFT RETROPERITONEAL  Final   Gram Stain   Final    ABUNDANT WBC PRESENT,BOTH PMN AND MONONUCLEAR NO ORGANISMS SEEN    Culture   Final    CULTURE REINCUBATED FOR BETTER GROWTH Performed at [facility], [address], [place], [place] [address]    Report Status PENDING  Incomplete  MRSA PCR Screening     Status: None   Collection Time: 03/22/19  5:09 PM   Specimen: Nasopharyngeal  Result Value Ref Range Status   MRSA by PCR NEGATIVE NEGATIVE Final    Comment:        The GeneXpert MRSA Assay (FDA approved for NASAL specimens only), is one component of a comprehensive MRSA colonization surveillance program. It is not intended to diagnose MRSA infection nor to guide or monitor treatment for MRSA infections. Performed at [facility], [address], [place], [place] [address]          Radiology Studies: Ir Nephrostomy Placement Left  Result Date: 03/22/2019 INDICATION: 85 year old with left hydronephrosis and fluid collections in the left abdomen along the left ureter. Plan for percutaneous nephrostomy tube placement and drainage of the abdominal fluid collections. EXAM: LEFT PERCUTANEOUS NEPHROSTOMY  TUBE PLACEMENT WITH ULTRASOUND AND FLUOROSCOPIC GUIDANCE COMPARISON:  None. MEDICATIONS: Ciprofloxacin 400 mg; The antibiotic was administered in an appropriate time frame prior to skin puncture. ANESTHESIA/SEDATION: Fentanyl 75 mcg IV The patient was continuously monitored during the procedure by the interventional radiology nurse under my direct supervision. CONTRAST:  10 mL Omnipaque 300-administered into the collecting system(s) FLUOROSCOPY TIME:  Fluoroscopy Time: 2 minutes, 6 seconds, 13 mGy COMPLICATIONS: None immediate. PROCEDURE: Informed written consent was obtained from the patient after a thorough discussion of the procedural risks, benefits and alternatives. All questions were addressed. Maximal Sterile Barrier Technique was utilized including caps, mask, sterile gowns, sterile gloves, sterile drape, hand hygiene and skin antiseptic. A timeout was performed prior to the initiation of the procedure. Patient was placed prone. Ultrasound was used to identify the left kidney. The left flank was prepped and draped in sterile fashion. Skin was anesthetized with 1% lidocaine. Using ultrasound guidance,  21 gauge needle was directed into a mid/upper pole calyx. Wire was advanced into the collecting system. An Accustick dilator set was placed. Tract was dilated to accommodate a 10.2 [place]French multipurpose drain. Fluid was aspirated from the left renal collecting system and specimen was sent for culture. Catheter was sutured to skin and attached to a gravity bag. FINDINGS: Moderate left hydronephrosis. Access obtained from a mid/upper pole calyx. Renal collecting system was decompressed at the end of the procedure. IMPRESSION: Successful placement of left percutaneous nephrostomy tube with ultrasound and fluoroscopic guidance. Urine specimen sent for culture. Electronically Signed   By: Richarda OverlieAdam  Henn M.D.   On: 03/22/2019 13:43   Ct Image Guided Drainage Percut Cath  Peritoneal Retroperit  Result Date:  03/22/2019 INDICATION: 85 year old with retroperitoneal complex fluid collection and left hydronephrosis. Patient has already undergone percutaneous left nephrostomy tube placement. Plan for CT-guided drainage of the complex retroperitoneal fluid collection. EXAM: CT-GUIDED LEFT RETROPERITONEAL ABSCESS DRAIN MEDICATIONS: The patient is currently admitted to the hospital and receiving intravenous antibiotics. The antibiotics were administered within an appropriate time frame prior to the initiation of the procedure. ANESTHESIA/SEDATION: Fentanyl 50 mcg The patient was continuously monitored during the procedure by the interventional radiology nurse under my direct supervision. COMPLICATIONS: None immediate. PROCEDURE: Informed written consent was obtained from the patient and daughter after a thorough discussion of the procedural risks, benefits and alternatives. All questions were addressed. A timeout was performed prior to the initiation of the procedure. Patient was placed prone. CT images through the abdomen were obtained. The left retroperitoneal complex fluid collection was targeted. The left flank was prepped with chlorhexidine and sterile field was created. Skin and soft tissues were anesthetized with 1% lidocaine. Using CT guidance, a 18 gauge trocar needle was directed into the retroperitoneal fluid collection. Brown purulent fluid was aspirated. Stiff Amplatz wire was advanced into the collection. Tract was dilated to accommodate a 10 [place] drain. Additional brown purulent fluid was removed. Follow up CT images were obtained. Catheter was sutured to skin and attached to a suction bulb. FINDINGS: Left kidney has been decompressed with a percutaneous nephrostomy tube. Perinephric and left retroperitoneal complex fluid collection was targeted. The lateral aspect of the collection was punctured and purulent fluid was aspirated. Stiff Amplatz wire advanced into a portion of the collection and a 10 French drain  was placed. Follow up CT images demonstrated that the drain may not fully decompress the more medial collections. IMPRESSION: CT-guided placement of a drainage catheter within the complex left retroperitoneal abscess collection. Not clear if the most medial aspect of the abscess will be drained from this catheter. Recommend short-term follow-up imaging to evaluate this drainage catheter and the complex collection. Electronically Signed   By: Richarda OverlieAdam  Henn M.D.   On: 03/22/2019 16:54        Scheduled Meds:  amiodarone  200 mg Oral Daily   famotidine  20 mg Oral BID   feeding supplement (PRO-STAT SUGAR FREE 64)  30 mL Oral BID WC   gabapentin  400 mg Oral TID   midodrine  10 mg Oral TID WC   multivitamin with minerals  1 tablet Oral Daily   nutrition supplement (JUVEN)  1 packet Oral BID BM   potassium chloride  20 mEq Oral Daily   Ensure Max Protein  11 oz Oral QHS   sodium chloride flush  3 mL Intravenous Q12H   sodium chloride flush  3 mL Intravenous Q12H   sodium chloride flush  5  mL Intracatheter Q8H   umeclidinium bromide  1 puff Inhalation Daily   Continuous Infusions:  sodium chloride 250 mL (03/23/19 1611)   cefTRIAXone (ROCEPHIN)  IV 2 g (03/23/19 1612)     LOS: 2 days   Time spent= 25 mins    Zayla Agar Joline Maxcy, MD [facility]  If 7PM-7AM, please contact night-coverage  03/24/2019, 7:37 AM

## 2019-03-25 LAB — CBC
HCT: 34.3 % — ABNORMAL LOW (ref 39.0–52.0)
Hemoglobin: 10.4 g/dL — ABNORMAL LOW (ref 13.0–17.0)
MCH: 31 pg (ref 26.0–34.0)
MCHC: 30.3 g/dL (ref 30.0–36.0)
MCV: 102.1 fL — ABNORMAL HIGH (ref 80.0–100.0)
Platelets: 322 10*3/uL (ref 150–400)
RBC: 3.36 MIL/uL — ABNORMAL LOW (ref 4.22–5.81)
RDW: 14.1 % (ref 11.5–15.5)
WBC: 7.8 10*3/uL (ref 4.0–10.5)
nRBC: 0 % (ref 0.0–0.2)

## 2019-03-25 LAB — HEPARIN LEVEL (UNFRACTIONATED)
Heparin Unfractionated: <0.1 IU/mL — ABNORMAL LOW (ref 0.30–0.70)
Heparin Unfractionated: 0.1 IU/mL — ABNORMAL LOW (ref 0.30–0.70)

## 2019-03-25 LAB — URINE CULTURE: Culture: 100 — AB

## 2019-03-25 NOTE — Progress Notes (Signed)
ANTICOAGULATION CONSULT NOTE - [facility] for heparin Indication: atrial fibrillation  Allergies  Allergen Reactions  . Nsaids Other (See Comments)    Stomach pain  . Morphine And Related Nausea And Vomiting and Other (See Comments)    Dizziness, light-headedness and "Opiates cause tightness in chest"  . Vicodin [Hydrocodone-Acetaminophen] Anxiety    Patient Measurements: Height: 5\' 10"  (177.8 cm) Weight: 217 lb 2.5 oz (98.5 kg) IBW/kg (Calculated) : 73 Heparin Dosing Weight: 92.3kg   Vital Signs: Temp: 98.6 F (37 C) (11/08 1213) Temp Source: Oral (11/08 1213) BP: 113/46 (11/08 1213) Pulse Rate: 72 (11/08 1213)  Labs: Recent Labs    03/24/19 0349  03/24/19 1241 03/24/19 2109 03/25/19 0639 03/25/19 1558  HGB 9.7*  --   --   --  10.4*  --   HCT 31.3*  --   --   --  34.3*  --   PLT 341  --   --   --  322  --   APTT  --   --  36  --   --   --   HEPARINUNFRC  --    < > <0.10* <0.10* <0.10* 0.10*  CREATININE 0.53*  --   --   --   --   --    < > = values in this interval not displayed.    Estimated Creatinine Clearance: 73.7 mL/min (A) (by C-G formula based on SCr of 0.53 mg/dL (L)).   Medical History: Past Medical History:  Diagnosis Date  . A-fib ([place])   . Acute systolic CHF (congestive heart failure), NYHA class 3 -- Unclear etilogy (? Afib related) EF down from 60-70% to 40%. 07/05/2012   Echo 2/21: LV upper limits of normal. EF- 40%. Cannot assess Diastolic function - Aortic Sclerosis, Mod-Severe Left Atrial dilation; Mild RV & RA dilation; Moderately elevated PA peak pressure: 54mm Hg     . Arthritis    knees  . Asthma   . ASTHMA 02/06/2007   Qualifier: Diagnosis of  By: Rogue Bussing CMA, Maryann Alar    . BENIGN PROSTATIC HYPERTROPHY 05/13/2010   Qualifier: Diagnosis of  By: Burnice Logan  MD, Doretha Sou   . Bifascicular block 12/18/2013  . BPH (benign prostatic hypertrophy)   . Bronchospasm, exercise-induced   . CHF (congestive heart failure)  (HCC)    systolic, EF 99% (8/33/8250)  . Chronic kidney disease    stabilized, due to infection  . Chronic rhinitis 06/02/2009   Qualifier: Diagnosis of  By: Arnoldo Morale MD, Balinda Quails   . DJD (degenerative joint disease), lumbar 01/18/2012  . Dyslipidemia   . Edema    Bilateral - left lower leg greater than right- wears compression stockings  . General weakness 07/05/2012  . GERD (gastroesophageal reflux disease)   . History of elevated glucose 10/01/2009   Qualifier: Diagnosis of  By: Arnoldo Morale MD, Balinda Quails   . Hx of colonic polyps 11/02/2011  . Hypertension   . INSOMNIA, CHRONIC, MILD 01/02/2008   Qualifier: Diagnosis of  By: Arnoldo Morale MD, Balinda Quails   . Macular degeneration 08-06-13   bilateral -sight impaired  . Obesity   . Paroxysmal Atrial fibrillation - admitted with RVR 01/18/2012  . S/P left THA, AA 08/13/2013  . Shortness of breath     Medications:  Scheduled:  . amiodarone  200 mg Oral Daily  . famotidine  20 mg Oral BID  . feeding supplement (PRO-STAT SUGAR FREE 64)  30 mL Oral BID WC  .  gabapentin  400 mg Oral TID  . midodrine  10 mg Oral TID WC  . multivitamin with minerals  1 tablet Oral Daily  . nutrition supplement (JUVEN)  1 packet Oral BID BM  . potassium chloride  20 mEq Oral Daily  . Ensure Max Protein  11 oz Oral QHS  . sodium chloride flush  3 mL Intravenous Q12H  . sodium chloride flush  3 mL Intravenous Q12H  . sodium chloride flush  5 mL Intracatheter Q8H  . umeclidinium bromide  1 puff Inhalation Daily   Infusions:  . sodium chloride 250 mL (03/23/19 1611)  . cefTRIAXone (ROCEPHIN)  IV 2 g (03/25/19 1703)  . heparin 1,750 Units/hr (03/25/19 1023)    Assessment: 85 yo male admitted on 2019/04/06 with abdominal pain, constipation, and watery stools found to have peinephric fluid collected requiring nephrostomy tube. Patient is on Xarelto PTA for Afib which was held on admission for possible surgery. Pharmacy has been consulted to dose heparin for Afib. CHAD2-VASc score  of 3 (points for age and gender).   Last dose of rivaroxaban on 06-Apr-2019 AM. Baseline heparin level <0.1 as expected given timing of last rivaroxaban dose.  Will make adjustments using heparin levels.    Heparin level 0.1 on 1750 uits/hr (at least it is now measurable). Per nursing no IV issues. No bleeding noted. CBC stable.   Goal of Therapy:  Heparin level 0.3-0.7 units/ml Monitor platelets by anticoagulation protocol: Yes   Plan:  Increase heparin to 1950 units/hr  Check heparin level at midnight on 11/8 Monitor daily heparin level, CBC, and S/S of bleeding   [employer], Pharm.D., BCPS Clinical Pharmacist  **Pharmacist phone directory can now be found on [email or web address] ([email or web address]).  Listed under [facility].  03/25/2019 5:40 PM

## 2019-03-25 NOTE — Progress Notes (Signed)
ANTICOAGULATION CONSULT NOTE - Follow-Up Consult  Pharmacy Consult for heparin Indication: atrial fibrillation  Allergies  Allergen Reactions  . Nsaids Other (See Comments)    Stomach pain  . Morphine And Related Nausea And Vomiting and Other (See Comments)    Dizziness, light-headedness and "Opiates cause tightness in chest"  . Vicodin [Hydrocodone-Acetaminophen] Anxiety    Patient Measurements: Height: 5\' 10"  (177.8 cm) Weight: 217 lb 2.5 oz (98.5 kg) IBW/kg (Calculated) : 73 Heparin Dosing Weight: 92.3kg   Vital Signs: Temp: 97.4 F (36.3 C) (11/08 0510) Temp Source: Oral (11/08 0510) BP: 94/35 (11/08 0510) Pulse Rate: 80 (11/08 0510)  Labs: Recent Labs    03/24/19 0349 03/24/19 1241 03/24/19 2109 03/25/19 0639  HGB 9.7*  --   --  10.4*  HCT 31.3*  --   --  34.3*  PLT 341  --   --  322  APTT  --  36  --   --   HEPARINUNFRC  --  <0.10* <0.10* <0.10*  CREATININE 0.53*  --   --   --     Estimated Creatinine Clearance: 73.7 mL/min (A) (by C-G formula based on SCr of 0.53 mg/dL (L)).   Medical History: Past Medical History:  Diagnosis Date  . A-fib (HCC)   . Acute systolic CHF (congestive heart failure), NYHA class 3 -- Unclear etilogy (? Afib related) EF down from 60-70% to 40%. 07/05/2012   Echo 2/21: LV upper limits of normal. EF- 40%. Cannot assess Diastolic function - Aortic Sclerosis, Mod-Severe Left Atrial dilation; Mild RV & RA dilation; Moderately elevated PA peak pressure: 86mm Hg     . Arthritis    knees  . Asthma   . ASTHMA 02/06/2007   Qualifier: Diagnosis of  By: 02/08/2007 CMA, Claiborne Billings    . BENIGN PROSTATIC HYPERTROPHY 05/13/2010   Qualifier: Diagnosis of  By: 05/15/2010  MD, Amador Cunas   . Bifascicular block 12/18/2013  . BPH (benign prostatic hypertrophy)   . Bronchospasm, exercise-induced   . CHF (congestive heart failure) (HCC)    systolic, EF 40% (07/07/2012)  . Chronic kidney disease    stabilized, due to infection  . Chronic rhinitis  06/02/2009   Qualifier: Diagnosis of  By: 06/04/2009 MD, Lovell Sheehan   . DJD (degenerative joint disease), lumbar 01/18/2012  . Dyslipidemia   . Edema    Bilateral - left lower leg greater than right- wears compression stockings  . General weakness 07/05/2012  . GERD (gastroesophageal reflux disease)   . History of elevated glucose 10/01/2009   Qualifier: Diagnosis of  By: 10/03/2009 MD, Lovell Sheehan   . Hx of colonic polyps 11/02/2011  . Hypertension   . INSOMNIA, CHRONIC, MILD 01/02/2008   Qualifier: Diagnosis of  By: 01/04/2008 MD, Lovell Sheehan   . Macular degeneration 08-06-13   bilateral -sight impaired  . Obesity   . Paroxysmal Atrial fibrillation - admitted with RVR 01/18/2012  . S/P left THA, AA 08/13/2013  . Shortness of breath     Medications:  Scheduled:  . amiodarone  200 mg Oral Daily  . famotidine  20 mg Oral BID  . feeding supplement (PRO-STAT SUGAR FREE 64)  30 mL Oral BID WC  . gabapentin  400 mg Oral TID  . midodrine  10 mg Oral TID WC  . multivitamin with minerals  1 tablet Oral Daily  . nutrition supplement (JUVEN)  1 packet Oral BID BM  . potassium chloride  20 mEq Oral Daily  . Ensure Max  Protein  11 oz Oral QHS  . sodium chloride flush  3 mL Intravenous Q12H  . sodium chloride flush  3 mL Intravenous Q12H  . sodium chloride flush  5 mL Intracatheter Q8H  . umeclidinium bromide  1 puff Inhalation Daily   Infusions:  . sodium chloride 250 mL (03/23/19 1611)  . cefTRIAXone (ROCEPHIN)  IV Stopped (03/24/19 1704)  . heparin 1,550 Units/hr (03/25/19 0630)    Assessment: 85 yo male admitted on 04/01/2019 with abdominal pain, constipation, and watery stools found to have peinephric fluid collected requiring nephrostomy tube. Patient is on Xarelto PTA for Afib which was held on admission for possible surgery. Pharmacy has been consulted to dose heparin for Afib. CHAD2-VASc score of 3 (points for age and gender).   Last dose of rivaroxaban on 03/20/2019 AM. Baseline heparin level <0.1 as  expected given timing of last rivaroxaban dose.  Will make adjustments using heparin levels.    Heparin level <0.1 on 1550 uits/hr. Per nursing no IV issues currently or overnight. No bleeding noted. CBC stable.   Goal of Therapy:  Heparin level 0.3-0.7 units/ml Monitor platelets by anticoagulation protocol: Yes   Plan:  Increase heparin to 1750 units/hr  Check heparin level 1630 on 11/8 Monitor daily heparin level, CBC, and S/S of bleeding   Cristela Felt, PharmD PGY1 Pharmacy Resident [employer]: [phone]   03/25/2019 8:09 AM

## 2019-03-25 NOTE — Progress Notes (Signed)
PROGRESS NOTE    [name]  ZOX:[id number] DOB: [birth year] DOA: 04/12/2019 PCP: System, Pcp Not In   Brief Narrative:   85 y.o. male with medical history significant foratrial fibrillation on Xarelto, chronic diastolic CHF, asthma/COPD, OSA on BiPAP, hypotension on midodrine, and chronic sacral ulcer, now presenting to the emergency department for evaluation of progressive abdominal distention with alternating constipation and watery stool.  Found to have perinephric fluid collection requiring nephrostomy tube.   Assessment & Plan:   Principal Problem:   Perinephric fluid collection Active Problems:   Asthma   Paroxysmal Atrial fibrillation - admitted with RVR   Chronic diastolic CHF (congestive heart failure) (HCC)   AF (paroxysmal atrial fibrillation) (HCC)   Hypokalemia   Ileus (HCC)   Closed compression fracture of L4 vertebra (HCC)   Sacral pressure ulcer   Renal cyst, left   Abdominal pain secondary to perinephric fluid collection Left-sided hydronephrosis/urinoma/abscess status post drain placement 11/5 -Status post Left nephrostomy tube -Final culture shows multiple resistance  -Continue antibiotics-IV Rocephin; will follow urology recs  - Nephrostomy tube and drain to gravity drainage -Appreciate urology input.  Repeat CT of the abdomen pelvis on Monday/Tuesday. - May need a left ureteroscopy/laser lithotripsy, ureteral stent placement per Urology  -Resume Xarelto when cleared.  IV heparin in the meantime -Reimaging few days to reassess the collection  L4 compression fracture, acute/subacute -Denies any recent fall or trauma.  Pain control if it becomes necessary.  Bowel regimen.  No neurologic compromise at this time -Supportive care.  Follow-up outpatient.  Ileus, improved  Chronic atrial fibrillation -Xarelto held prior to the procedure. Cont heparin gtt for now -  If no further procedure plans by urology, will resume Xarelto. -Continue amiodarone  -Once normal procedure plan, resume Xarelto  Left renal cyst -Outpatient nonemergent MRI  History of COPD -Not in active exacerbation.  Resume bronchodilators.  BiPAP at bedtime  Chronic sacral pressure ulcer, stage IV, present on admission -Wound care team following.  Chronic neuropathy, peripheral -Gabapentin 400 mg 3 times daily  Spoke with patient's daughter and updated her on patient's current status and plan  DVT prophylaxis: Heparin drip for now.  Eventually resume Xarelto Code Status: Partial code Family Communication: None Disposition Plan: Maintain hospital stay to manage his nephrostomy tube and the drain.  Repeat CT on Monday or Tuesday.  Consultants:   IR  Urology  Procedures:   Nephrostomy placed 11/5  Antimicrobials:   IV Rocephin   Subjective: Patient was lying on bed.  When called his name he was able to respond and have conversation.  Patient daughter was with patient early in the morning and wanted to talk to the MD.  Review of Systems Otherwise negative except as per HPI, including: General: Denies fever, chills, night sweats or unintended weight loss. Resp: Denies cough, wheezing, shortness of breath. Cardiac: Denies chest pain, palpitations, orthopnea, paroxysmal nocturnal dyspnea. GI: Denies abdominal pain, nausea, vomiting, diarrhea or constipation GU: Denies dysuria, frequency, hesitancy or incontinence MS: Denies muscle aches, joint pain or swelling Neuro: Denies headache, neurologic deficits (focal weakness, numbness, tingling), abnormal gait Psych: Denies anxiety, depression, SI/HI/AVH Skin: Denies new rashes or lesions ID: Denies sick contacts, exotic exposures, travel  Objective: Vitals:   03/25/19 0510 03/25/19 1019 03/25/19 1051 03/25/19 1213  BP: (!) 94/35 (!) 101/45  (!) 113/46  Pulse: 80 72  72  Resp: 20  18 17   Temp: (!) 97.4 F (36.3 C)  99.3 F (37.4 C) 98.6 F (37  C)  TempSrc: Oral  Oral Oral  SpO2: (!) 87% 97%   97%  Weight: 98.5 kg     Height:        Intake/Output Summary (Last 24 hours) at 03/25/2019 1403 Last data filed at 03/25/2019 1359 Gross per 24 hour  Intake 2345.98 ml  Output 1660 ml  Net 685.98 ml   Filed Weights   03/23/19 0500 03/24/19 0500 03/25/19 0510  Weight: 93.5 kg 95.9 kg 98.5 kg    Examination:  General exam: Not in acute distress.  Appears chronically ill. Respiratory system: Clear to auscultation bilaterally Cardiovascular system: Normal sinus rhythm Gastrointestinal system: Abdomen is very minimally distended but has positive bowel sounds. GU: Has a left nephrostomy tube and drain bag which has my minimal to moderate urine collected.  No obvious purulent materials or bleeding.  Tube insertion site appears to be dry and clean. Central nervous system: Alert and oriented. No focal neurological deficits. Extremities: Symmetric 4 x 5 power.  No edema Skin: No rashes Psychiatry: Judgement and insight appear normal. Mood & affect appropriate.   Left-sided nephrostomy tube Chronic stage IV sacral ulcer External urinary catheter  Data Reviewed:   CBC: Recent Labs  Lab 2019/04/20 1749 03/22/19 0450 03/24/19 0349 03/25/19 0639  WBC 10.6* 11.4* 7.6 7.8  NEUTROABS 7.4 8.7*  --   --   HGB 10.9* 10.5* 9.7* 10.4*  HCT 34.3* 33.3* 31.3* 34.3*  MCV 99.1 100.0 100.6* 102.1*  PLT 279 302 341 696   Basic Metabolic Panel: Recent Labs  Lab 20-Apr-2019 1749 03/22/19 0450 03/24/19 0349  NA 142 141 143  K 2.9* 3.4* 3.8  CL 94* 97* 100  CO2 37* 37* 35*  GLUCOSE 111* 106* 93  BUN 22 14 15   CREATININE 0.74 0.62 0.53*  CALCIUM 8.1* 8.2* 8.4*  MG  --  2.1  --    GFR: Estimated Creatinine Clearance: 73.7 mL/min (A) (by C-G formula based on SCr of 0.53 mg/dL (L)). Liver Function Tests: Recent Labs  Lab 04/20/2019 1749  AST 19  ALT 15  ALKPHOS 64  BILITOT 0.4  PROT 5.6*  ALBUMIN 2.0*   Recent Labs  Lab Apr 20, 2019 1749  LIPASE 20   No results for input(s): AMMONIA  in the last 168 hours. Coagulation Profile: Recent Labs  Lab 03/22/19 0759  INR 1.1   Cardiac Enzymes: No results for input(s): CKTOTAL, CKMB, CKMBINDEX, TROPONINI in the last 168 hours. BNP (last 3 results) No results for input(s): PROBNP in the last 8760 hours. HbA1C: No results for input(s): HGBA1C in the last 72 hours. CBG: No results for input(s): GLUCAP in the last 168 hours. Lipid Profile: No results for input(s): CHOL, HDL, LDLCALC, TRIG, CHOLHDL, LDLDIRECT in the last 72 hours. Thyroid Function Tests: No results for input(s): TSH, T4TOTAL, FREET4, T3FREE, THYROIDAB in the last 72 hours. Anemia Panel: No results for input(s): VITAMINB12, FOLATE, FERRITIN, TIBC, IRON, RETICCTPCT in the last 72 hours. Sepsis Labs: Recent Labs  Lab 04/20/19 1749  LATICACIDVEN 0.8    Recent Results (from the past 240 hour(s))  SARS CORONAVIRUS 2 (TAT 6-24 HRS) Nasopharyngeal Nasopharyngeal Swab     Status: None   Collection Time: 2019-04-20  5:54 PM   Specimen: Nasopharyngeal Swab  Result Value Ref Range Status   SARS Coronavirus 2 NEGATIVE NEGATIVE Final    Comment: (NOTE) SARS-CoV-2 target nucleic acids are NOT DETECTED. The SARS-CoV-2 RNA is generally detectable in upper and lower respiratory specimens during the acute phase of infection. Negative  results do not preclude SARS-CoV-2 infection, do not rule out co-infections with other pathogens, and should not be used as the sole basis for treatment or other patient management decisions. Negative results must be combined with clinical observations, patient history, and epidemiological information. The expected result is Negative. Fact Sheet for Patients: HairSlick.no Fact Sheet for Healthcare Providers: quierodirigir.com This test is not yet approved or cleared by the [place] FDA and  has been authorized for detection and/or diagnosis of SARS-CoV-2 by FDA under an  Emergency Use Authorization (EUA). This EUA will remain  in effect (meaning this test can be used) for the duration of the COVID-19 declaration under Section 56 4(b)(1) of the Act, 21 U.S.C. section 360bbb-3(b)(1), unless the authorization is terminated or revoked sooner. Performed at [facility], [address], [place], [place] [address]   UCx     Status: Abnormal   Collection Time: 03/28/2019  7:43 PM   Specimen: Urine, Random  Result Value Ref Range Status   Specimen Description URINE, RANDOM  Final   Special Requests NONE  Final   Culture (A)  Final    <10,000 COLONIES/mL INSIGNIFICANT GROWTH Performed at [facility], [address], [place], [place] [address]    Report Status 03/22/2019 FINAL  Final  Urine culture     Status: Abnormal   Collection Time: 03/22/19 11:20 AM   Specimen: Kidney; Urine  Result Value Ref Range Status   Specimen Description KIDNEY LEFT  Final   Special Requests   Final    NONE Performed at [facility], [address], [place], [place] [address]    Culture 100 COLONIES/mL PROTEUS MIRABILIS (A)  Final   Report Status 03/25/2019 FINAL  Final   Organism ID, Bacteria PROTEUS MIRABILIS (A)  Final      Susceptibility   Proteus mirabilis - MIC*    AMPICILLIN >=32 RESISTANT Resistant     CEFAZOLIN 16 SENSITIVE Sensitive     CEFEPIME <=1 SENSITIVE Sensitive     CEFTAZIDIME <=1 SENSITIVE Sensitive     CEFTRIAXONE <=1 SENSITIVE Sensitive     CIPROFLOXACIN >=4 RESISTANT Resistant     GENTAMICIN <=1 SENSITIVE Sensitive     IMIPENEM 2 SENSITIVE Sensitive     TRIMETH/SULFA >=320 RESISTANT Resistant     AMPICILLIN/SULBACTAM >=32 RESISTANT Resistant     PIP/TAZO <=4 SENSITIVE Sensitive     * 100 COLONIES/mL PROTEUS MIRABILIS  Aerobic/Anaerobic Culture (surgical/deep wound)     Status: None (Preliminary result)   Collection Time: 03/22/19  3:14 PM   Specimen: Abscess  Result Value Ref Range Status   Specimen Description ABSCESS  Final    Special Requests LEFT RETROPERITONEAL  Final   Gram Stain   Final    ABUNDANT WBC PRESENT,BOTH PMN AND MONONUCLEAR NO ORGANISMS SEEN Performed at [facility], [address], [place], [place] [address]    Culture   Final    RARE PROTEUS MIRABILIS NO ANAEROBES ISOLATED; CULTURE IN PROGRESS FOR 5 DAYS    Report Status PENDING  Incomplete   Organism ID, Bacteria PROTEUS MIRABILIS  Final      Susceptibility   Proteus mirabilis - MIC*    AMPICILLIN >=32 RESISTANT Resistant     CEFAZOLIN 16 SENSITIVE Sensitive     CEFEPIME <=1 SENSITIVE Sensitive     CEFTAZIDIME <=1 SENSITIVE Sensitive     CEFTRIAXONE <=1 SENSITIVE Sensitive     CIPROFLOXACIN >=4 RESISTANT Resistant     GENTAMICIN <=  1 SENSITIVE Sensitive     IMIPENEM 2 SENSITIVE Sensitive     TRIMETH/SULFA >=320 RESISTANT Resistant     AMPICILLIN/SULBACTAM >=32 RESISTANT Resistant     PIP/TAZO <=4 SENSITIVE Sensitive     * RARE PROTEUS MIRABILIS  MRSA PCR Screening     Status: None   Collection Time: 03/22/19  5:09 PM   Specimen: Nasopharyngeal  Result Value Ref Range Status   MRSA by PCR NEGATIVE NEGATIVE Final    Comment:        The GeneXpert MRSA Assay (FDA approved for NASAL specimens only), is one component of a comprehensive MRSA colonization surveillance program. It is not intended to diagnose MRSA infection nor to guide or monitor treatment for MRSA infections. Performed at [facility], [address], [place], [place] [address]          Radiology Studies: No results found.      Scheduled Meds: . amiodarone  200 mg Oral Daily  . famotidine  20 mg Oral BID  . feeding supplement (PRO-STAT SUGAR FREE 64)  30 mL Oral BID WC  . gabapentin  400 mg Oral TID  . midodrine  10 mg Oral TID WC  . multivitamin with minerals  1 tablet Oral Daily  . nutrition supplement (JUVEN)  1 packet Oral BID BM  . potassium chloride  20 mEq Oral Daily  . Ensure Max Protein  11 oz Oral QHS  . sodium chloride  flush  3 mL Intravenous Q12H  . sodium chloride flush  3 mL Intravenous Q12H  . sodium chloride flush  5 mL Intracatheter Q8H  . umeclidinium bromide  1 puff Inhalation Daily   Continuous Infusions: . sodium chloride 250 mL (03/23/19 1611)  . cefTRIAXone (ROCEPHIN)  IV Stopped (03/24/19 1704)  . heparin 1,750 Units/hr (03/25/19 1023)     LOS: 3 days   Time spent= 25 mins    Thomasenia Bottoms, MD [facility]  If 7PM-7AM, please contact night-coverage  03/25/2019, 2:03 PM

## 2019-03-25 NOTE — Progress Notes (Signed)
Pt placed on CPAP at previous settings. tol well 

## 2019-03-26 LAB — HEPARIN LEVEL (UNFRACTIONATED)
Heparin Unfractionated: 0.42 IU/mL (ref 0.30–0.70)
Heparin Unfractionated: 0.45 IU/mL (ref 0.30–0.70)

## 2019-03-26 LAB — BASIC METABOLIC PANEL
Anion gap: 7 (ref 5–15)
BUN: 14 mg/dL (ref 8–23)
CO2: 34 mmol/L — ABNORMAL HIGH (ref 22–32)
Calcium: 8.6 mg/dL — ABNORMAL LOW (ref 8.9–10.3)
Chloride: 96 mmol/L — ABNORMAL LOW (ref 98–111)
Creatinine, Ser: 0.62 mg/dL (ref 0.61–1.24)
GFR calc Af Amer: >60 mL/min (ref >60)
GFR calc non Af Amer: >60 mL/min (ref >60)
Glucose, Bld: 137 mg/dL — ABNORMAL HIGH (ref 70–99)
Potassium: 4 mmol/L (ref 3.5–5.1)
Sodium: 137 mmol/L (ref 135–145)

## 2019-03-26 LAB — CBC
HCT: 30.6 % — ABNORMAL LOW (ref 39.0–52.0)
Hemoglobin: 9.5 g/dL — ABNORMAL LOW (ref 13.0–17.0)
MCH: 31.8 pg (ref 26.0–34.0)
MCHC: 31 g/dL (ref 30.0–36.0)
MCV: 102.3 fL — ABNORMAL HIGH (ref 80.0–100.0)
Platelets: 327 10*3/uL (ref 150–400)
RBC: 2.99 MIL/uL — ABNORMAL LOW (ref 4.22–5.81)
RDW: 14.1 % (ref 11.5–15.5)
WBC: 8.6 10*3/uL (ref 4.0–10.5)
nRBC: 0 % (ref 0.0–0.2)

## 2019-03-26 NOTE — Progress Notes (Signed)
ANTICOAGULATION CONSULT NOTE - [place] for heparin Indication: atrial fibrillation  Allergies  Allergen Reactions  . Nsaids Other (See Comments)    Stomach pain  . Morphine And Related Nausea And Vomiting and Other (See Comments)    Dizziness, light-headedness and "Opiates cause tightness in chest"  . Vicodin [Hydrocodone-Acetaminophen] Anxiety    Patient Measurements: Height: 5\' 10"  (177.8 cm) Weight: 210 lb (95.3 kg) IBW/kg (Calculated) : 73 Heparin Dosing Weight: 92.3kg   Vital Signs: Temp: 98.6 F (37 C) (11/09 0508) BP: 110/49 (11/09 1051) Pulse Rate: 66 (11/09 1051)  Labs: Recent Labs    03/24/19 0349 03/24/19 1241  03/25/19 0639 03/25/19 1558 03/26/19 0046 03/26/19 0931  HGB 9.7*  --   --  10.4*  --  9.5*  --   HCT 31.3*  --   --  34.3*  --  30.6*  --   PLT 341  --   --  322  --  327  --   APTT  --  36  --   --   --   --   --   HEPARINUNFRC  --  <0.10*   < > <0.10* 0.10* 0.42 0.45  CREATININE 0.53*  --   --   --   --   --  0.62   < > = values in this interval not displayed.    Estimated Creatinine Clearance: 72.5 mL/min (by C-G formula based on SCr of 0.62 mg/dL).   Medical History: Past Medical History:  Diagnosis Date  . A-fib ([place])   . Acute systolic CHF (congestive heart failure), NYHA class 3 -- Unclear etilogy (? Afib related) EF down from 60-70% to 40%. 07/05/2012   Echo 2/21: LV upper limits of normal. EF- 40%. Cannot assess Diastolic function - Aortic Sclerosis, Mod-Severe Left Atrial dilation; Mild RV & RA dilation; Moderately elevated PA peak pressure: 66mm Hg     . Arthritis    knees  . Asthma   . ASTHMA 02/06/2007   Qualifier: Diagnosis of  By: Rogue Bussing CMA, Maryann Alar    . BENIGN PROSTATIC HYPERTROPHY 05/13/2010   Qualifier: Diagnosis of  By: Burnice Logan  MD, Doretha Sou   . Bifascicular block 12/18/2013  . BPH (benign prostatic hypertrophy)   . Bronchospasm, exercise-induced   . CHF (congestive heart failure)  (HCC)    systolic, EF 95% (2/84/1324)  . Chronic kidney disease    stabilized, due to infection  . Chronic rhinitis 06/02/2009   Qualifier: Diagnosis of  By: Arnoldo Morale MD, Balinda Quails   . DJD (degenerative joint disease), lumbar 01/18/2012  . Dyslipidemia   . Edema    Bilateral - left lower leg greater than right- wears compression stockings  . General weakness 07/05/2012  . GERD (gastroesophageal reflux disease)   . History of elevated glucose 10/01/2009   Qualifier: Diagnosis of  By: Arnoldo Morale MD, Balinda Quails   . Hx of colonic polyps 11/02/2011  . Hypertension   . INSOMNIA, CHRONIC, MILD 01/02/2008   Qualifier: Diagnosis of  By: Arnoldo Morale MD, Balinda Quails   . Macular degeneration 08-06-13   bilateral -sight impaired  . Obesity   . Paroxysmal Atrial fibrillation - admitted with RVR 01/18/2012  . S/P left THA, AA 08/13/2013  . Shortness of breath     Medications:  Scheduled:  . amiodarone  200 mg Oral Daily  . famotidine  20 mg Oral BID  . feeding supplement (PRO-STAT SUGAR FREE 64)  30 mL Oral BID WC  .  gabapentin  400 mg Oral TID  . midodrine  10 mg Oral TID WC  . multivitamin with minerals  1 tablet Oral Daily  . nutrition supplement (JUVEN)  1 packet Oral BID BM  . potassium chloride  20 mEq Oral Daily  . Ensure Max Protein  11 oz Oral QHS  . sodium chloride flush  3 mL Intravenous Q12H  . sodium chloride flush  3 mL Intravenous Q12H  . sodium chloride flush  5 mL Intracatheter Q8H  . umeclidinium bromide  1 puff Inhalation Daily   Infusions:  . sodium chloride 250 mL (03/23/19 1611)  . cefTRIAXone (ROCEPHIN)  IV Stopped (03/25/19 1733)  . heparin 1,950 Units/hr (03/26/19 0000)    Assessment: 85 yo male admitted on Apr 09, 2019 with abdominal pain, constipation, and watery stools found to have peinephric fluid collected requiring nephrostomy tube. Patient is on Xarelto PTA for Afib which was held on admission for possible surgery. Pharmacy has been consulted to dose heparin for Afib. CHAD2-VASc  score of 3 (points for age and gender).   Last dose of rivaroxaban on Apr 09, 2019 AM. Baseline heparin level <0.1 as expected given timing of last rivaroxaban dose.  Will make adjustments using heparin levels.    Heparin level remains therapeutic this AM x2, CBC stable.  Goal of Therapy:  Heparin level 0.3-0.7 units/ml Monitor platelets by anticoagulation protocol: Yes   Plan:  Continue heparin at 1950 units/hr  Daily heparin level, CBC, s/s bleeding F/u ability to transition back to Xarelto  Daylene Posey, PharmD Clinical Pharmacist Please check AMION for all [facility] numbers 03/26/2019 11:04 AM

## 2019-03-26 NOTE — Care Management Important Message (Signed)
Important Message  Patient Details  Name: SYAIRE SABER MRN: [id number] Date of Birth: 1927-03-14   Medicare Important Message Given:  Yes     Shelda Altes 03/26/2019, 12:27 PM

## 2019-03-26 NOTE — Progress Notes (Addendum)
PROGRESS NOTE    Boby Eyer Dise  QPY:[id number] DOB: 02-27-1927 DOA: 04/11/2019 PCP: System, Pcp Not In   Brief Narrative:   85 y.o. male with medical history significant foratrial fibrillation on Xarelto, chronic diastolic CHF, asthma/COPD, OSA on BiPAP, hypotension on midodrine, and chronic sacral ulcer, now presenting to the emergency department for evaluation of progressive abdominal distention with alternating constipation and watery stool.  Found to have perinephric fluid collection requiring nephrostomy tube.   Assessment & Plan:   Principal Problem:   Perinephric fluid collection Active Problems:   Asthma   Paroxysmal Atrial fibrillation - admitted with RVR   Chronic diastolic CHF (congestive heart failure) (HCC)   AF (paroxysmal atrial fibrillation) (HCC)   Hypokalemia   Ileus (HCC)   Closed compression fracture of L4 vertebra (HCC)   Sacral pressure ulcer   Renal cyst, left   Abdominal pain secondary to perinephric fluid collection Left-sided hydronephrosis/urinoma/abscess status post drain placement 11/5 -Status post Left nephrostomy tube -Final culture shows multiple resistance  -Continue antibiotics-IV Rocephin; will follow urology recs  - Nephrostomy tube and drain to gravity drainage -Appreciate urology input.  - IR input appreciated; Repeat CT of the abdomen pelvis on Monday/Tuesday. - May need a left ureteroscopy/laser lithotripsy, ureteral stent placement per Urology  -Resume Xarelto when cleared.  IV heparin in the meantime -Reimaging few days to reassess the collection  L4 compression fracture, acute/subacute -Denies any recent fall or trauma.  Pain control if it becomes necessary.  Bowel regimen.  No neurologic compromise at this time -Supportive care.  Follow-up outpatient.  Ileus, improved  Chronic atrial fibrillation -Xarelto held prior to the procedure. Cont heparin gtt for now -  If no further procedure plans by urology, will resume Xarelto.  -Continue amiodarone -Once normal procedure plan, resume Xarelto  Left renal cyst -Outpatient nonemergent MRI  History of COPD -Not in active exacerbation.  Resume bronchodilators.  BiPAP at bedtime  Chronic sacral pressure ulcer, stage IV, present on admission -Wound care team following.  Chronic neuropathy, peripheral -Gabapentin 400 mg 3 times daily  Hypotension: episodes in the Am as SBP in 80's per nurse. Pt had no complaints and was resting comfortably.  - BP in 100's range now  - Pt received his Midodrine dose as scheduled  - Cont to monitor  - Cont Midodrine as scheduled   Spoke with patient's daughter and updated her on patient's current status and plan  DVT prophylaxis: Heparin drip for now.  Eventually resume Xarelto Code Status: Partial code Family Communication: None Disposition Plan: Maintain hospital stay to manage his nephrostomy tube and the drain.  Repeat CT on Monday or Tuesday.  Consultants:   IR  Urology  Procedures:   Nephrostomy placed 11/5  Antimicrobials:   IV Rocephin   Subjective: Was sleepy. Denies any complaints. JB bag and drainage bags have serous liquid. No fever.   Review of Systems Otherwise negative except as per HPI, including: General: Denies fever, chills, night sweats or unintended weight loss. Resp: Denies cough, wheezing, shortness of breath. Cardiac: Denies chest pain, palpitations, orthopnea, paroxysmal nocturnal dyspnea. GI: Denies abdominal pain, nausea, vomiting, diarrhea or constipation GU: Denies dysuria, frequency, hesitancy or incontinence MS: Denies muscle aches, joint pain or swelling Neuro: Denies headache, neurologic deficits (focal weakness, numbness, tingling), abnormal gait Psych: Denies anxiety, depression, SI/HI/AVH Skin: Denies new rashes or lesions ID: Denies sick contacts, exotic exposures, travel  Objective: Vitals:   03/26/19 0849 03/26/19 0900 03/26/19 1051 03/26/19 1237  BP: [name])  86/45 (!)  97/41 (!) 110/49 (!) 114/49  Pulse:   66 69  Resp:    18  Temp:    (!) 97.4 F (36.3 C)  TempSrc:    Oral  SpO2: 95%   91%  Weight:      Height:        Intake/Output Summary (Last 24 hours) at 03/26/2019 1414 Last data filed at 03/26/2019 1342 Gross per 24 hour  Intake 2119.64 ml  Output 1905 ml  Net 214.64 ml   Filed Weights   03/24/19 0500 03/25/19 0510 03/26/19 0508  Weight: 95.9 kg 98.5 kg 95.3 kg    Examination:  General exam: Not in acute distress.  Appears chronically ill. Respiratory system: Clear to auscultation bilaterally Cardiovascular system: Normal sinus rhythm Gastrointestinal system: Abdomen is very minimally distended but has positive bowel sounds. GU: Has a left nephrostomy tube and drain bag which has my minimal to moderate urine collected.  No obvious purulent materials or bleeding.  Tube insertion site appears to be dry and clean. Central nervous system: Alert and oriented. No focal neurological deficits. Extremities: Symmetric 4 x 5 power.  No edema Skin: No rashes Psychiatry: Judgement and insight appear normal. Mood & affect appropriate.   Left-sided nephrostomy tube Chronic stage IV sacral ulcer External urinary catheter  Data Reviewed:   CBC: Recent Labs  Lab 03-30-19 1749 03/22/19 0450 03/24/19 0349 03/25/19 0639 03/26/19 0046  WBC 10.6* 11.4* 7.6 7.8 8.6  NEUTROABS 7.4 8.7*  --   --   --   HGB 10.9* 10.5* 9.7* 10.4* 9.5*  HCT 34.3* 33.3* 31.3* 34.3* 30.6*  MCV 99.1 100.0 100.6* 102.1* 102.3*  PLT 279 302 341 322 327   Basic Metabolic Panel: Recent Labs  Lab 03-30-2019 1749 03/22/19 0450 03/24/19 0349 03/26/19 0931  NA 142 141 143 137  K 2.9* 3.4* 3.8 4.0  CL 94* 97* 100 96*  CO2 37* 37* 35* 34*  GLUCOSE 111* 106* 93 137*  BUN 22 14 15 14   CREATININE 0.74 0.62 0.53* 0.62  CALCIUM 8.1* 8.2* 8.4* 8.6*  MG  --  2.1  --   --    GFR: Estimated Creatinine Clearance: 72.5 mL/min (by C-G formula based on SCr of 0.62 mg/dL).  Liver Function Tests: Recent Labs  Lab 03-30-19 1749  AST 19  ALT 15  ALKPHOS 64  BILITOT 0.4  PROT 5.6*  ALBUMIN 2.0*   Recent Labs  Lab 03-30-19 1749  LIPASE 20   No results for input(s): AMMONIA in the last 168 hours. Coagulation Profile: Recent Labs  Lab 03/22/19 0759  INR 1.1   Cardiac Enzymes: No results for input(s): CKTOTAL, CKMB, CKMBINDEX, TROPONINI in the last 168 hours. BNP (last 3 results) No results for input(s): PROBNP in the last 8760 hours. HbA1C: No results for input(s): HGBA1C in the last 72 hours. CBG: No results for input(s): GLUCAP in the last 168 hours. Lipid Profile: No results for input(s): CHOL, HDL, LDLCALC, TRIG, CHOLHDL, LDLDIRECT in the last 72 hours. Thyroid Function Tests: No results for input(s): TSH, T4TOTAL, FREET4, T3FREE, THYROIDAB in the last 72 hours. Anemia Panel: No results for input(s): VITAMINB12, FOLATE, FERRITIN, TIBC, IRON, RETICCTPCT in the last 72 hours. Sepsis Labs: Recent Labs  Lab Mar 30, 2019 1749  LATICACIDVEN 0.8    Recent Results (from the past 240 hour(s))  SARS CORONAVIRUS 2 (TAT 6-24 HRS) Nasopharyngeal Nasopharyngeal Swab     Status: None   Collection Time: 03/30/19  5:54 PM   Specimen:  Nasopharyngeal Swab  Result Value Ref Range Status   SARS Coronavirus 2 NEGATIVE NEGATIVE Final    Comment: (NOTE) SARS-CoV-2 target nucleic acids are NOT DETECTED. The SARS-CoV-2 RNA is generally detectable in upper and lower respiratory specimens during the acute phase of infection. Negative results do not preclude SARS-CoV-2 infection, do not rule out co-infections with other pathogens, and should not be used as the sole basis for treatment or other patient management decisions. Negative results must be combined with clinical observations, patient history, and epidemiological information. The expected result is Negative. Fact Sheet for Patients: SugarRoll.be Fact Sheet for Healthcare  Providers: https://www.woods-mathews.com/ This test is not yet approved or cleared by the [place] FDA and  has been authorized for detection and/or diagnosis of SARS-CoV-2 by FDA under an Emergency Use Authorization (EUA). This EUA will remain  in effect (meaning this test can be used) for the duration of the COVID-19 declaration under Section 56 4(b)(1) of the Act, 21 U.S.C. section 360bbb-3(b)(1), unless the authorization is terminated or revoked sooner. Performed at [facility], [place] [address] [place], [place] [address]   UCx     Status: Abnormal   Collection Time: 03/22/2019  7:43 PM   Specimen: Urine, Random  Result Value Ref Range Status   Specimen Description URINE, RANDOM  Final   Special Requests NONE  Final   Culture (A)  Final    <10,000 COLONIES/mL INSIGNIFICANT GROWTH Performed at [facility], [place] [address], [place], [place] [address]    Report Status 03/22/2019 FINAL  Final  Urine culture     Status: Abnormal   Collection Time: 03/22/19 11:20 AM   Specimen: Kidney; Urine  Result Value Ref Range Status   Specimen Description KIDNEY LEFT  Final   Special Requests   Final    NONE Performed at [facility], [place] [address], [place], [place] [address]    Culture 100 COLONIES/mL PROTEUS MIRABILIS (A)  Final   Report Status 03/25/2019 FINAL  Final   Organism ID, Bacteria PROTEUS MIRABILIS (A)  Final      Susceptibility   Proteus mirabilis - MIC*    AMPICILLIN >=32 RESISTANT Resistant     CEFAZOLIN 16 SENSITIVE Sensitive     CEFEPIME <=1 SENSITIVE Sensitive     CEFTAZIDIME <=1 SENSITIVE Sensitive     CEFTRIAXONE <=1 SENSITIVE Sensitive     CIPROFLOXACIN >=4 RESISTANT Resistant     GENTAMICIN <=1 SENSITIVE Sensitive     IMIPENEM 2 SENSITIVE Sensitive     TRIMETH/SULFA >=320 RESISTANT Resistant     AMPICILLIN/SULBACTAM >=32 RESISTANT Resistant     PIP/TAZO <=4 SENSITIVE Sensitive     * 100 COLONIES/mL PROTEUS MIRABILIS   Aerobic/Anaerobic Culture (surgical/deep wound)     Status: None (Preliminary result)   Collection Time: 03/22/19  3:14 PM   Specimen: Abscess  Result Value Ref Range Status   Specimen Description ABSCESS  Final   Special Requests LEFT RETROPERITONEAL  Final   Gram Stain   Final    ABUNDANT WBC PRESENT,BOTH PMN AND MONONUCLEAR NO ORGANISMS SEEN Performed at [facility], [address], [place], [place] [address]    Culture   Final    RARE PROTEUS MIRABILIS NO ANAEROBES ISOLATED; CULTURE IN PROGRESS FOR 5 DAYS    Report Status PENDING  Incomplete   Organism ID, Bacteria PROTEUS MIRABILIS  Final      Susceptibility   Proteus mirabilis - MIC*    AMPICILLIN >=32  RESISTANT Resistant     CEFAZOLIN 16 SENSITIVE Sensitive     CEFEPIME <=1 SENSITIVE Sensitive     CEFTAZIDIME <=1 SENSITIVE Sensitive     CEFTRIAXONE <=1 SENSITIVE Sensitive     CIPROFLOXACIN >=4 RESISTANT Resistant     GENTAMICIN <=1 SENSITIVE Sensitive     IMIPENEM 2 SENSITIVE Sensitive     TRIMETH/SULFA >=320 RESISTANT Resistant     AMPICILLIN/SULBACTAM >=32 RESISTANT Resistant     PIP/TAZO <=4 SENSITIVE Sensitive     * RARE PROTEUS MIRABILIS  MRSA PCR Screening     Status: None   Collection Time: 03/22/19  5:09 PM   Specimen: Nasopharyngeal  Result Value Ref Range Status   MRSA by PCR NEGATIVE NEGATIVE Final    Comment:        The GeneXpert MRSA Assay (FDA approved for NASAL specimens only), is one component of a comprehensive MRSA colonization surveillance program. It is not intended to diagnose MRSA infection nor to guide or monitor treatment for MRSA infections. Performed at [facility], [address] [place], [place] [address]          Radiology Studies: No results found.      Scheduled Meds: . amiodarone  200 mg Oral Daily  . famotidine  20 mg Oral BID  . feeding supplement (PRO-STAT SUGAR FREE 64)  30 mL Oral BID WC  . gabapentin  400 mg Oral TID  . midodrine  10 mg Oral  TID WC  . multivitamin with minerals  1 tablet Oral Daily  . nutrition supplement (JUVEN)  1 packet Oral BID BM  . potassium chloride  20 mEq Oral Daily  . Ensure Max Protein  11 oz Oral QHS  . sodium chloride flush  3 mL Intravenous Q12H  . sodium chloride flush  3 mL Intravenous Q12H  . sodium chloride flush  5 mL Intracatheter Q8H  . umeclidinium bromide  1 puff Inhalation Daily   Continuous Infusions: . sodium chloride 250 mL (03/23/19 1611)  . cefTRIAXone (ROCEPHIN)  IV Stopped (03/25/19 1733)  . heparin 1,950 Units/hr (03/26/19 1144)     LOS: 4 days   Time spent= 25 mins    Thomasenia Bottomsawfikul Knute Mazzuca, MD [facility]  If 7PM-7AM, please contact night-coverage  03/26/2019, 2:14 PM

## 2019-03-26 NOTE — Progress Notes (Signed)
Referring Physician(s): Dr. Antionette Char  Supervising Physician: Dr. Miles Costain  Patient Status:  [facility] - In-pt  Chief Complaint: Left hydronephrosis Urinoma  Subjective: Patient resting comfortably this AM.  No new complaints. A little sore at drain sites. Son at bedside.   Allergies: Nsaids, Morphine and related, and Vicodin [hydrocodone-acetaminophen]  Medications:  Current Facility-Administered Medications:    0.9 %  sodium chloride infusion, 250 mL, Intravenous, PRN, Opyd, Lavone Neri, MD, Last Rate: 10 mL/hr at 03/23/19 1611, 250 mL at 03/23/19 1611   acetaminophen (TYLENOL) tablet 650 mg, 650 mg, Oral, Q6H PRN **OR** acetaminophen (TYLENOL) suppository 650 mg, 650 mg, Rectal, Q6H PRN, Opyd, Timothy S, MD   albuterol (PROVENTIL) (2.5 MG/3ML) 0.083% nebulizer solution 2.5 mg, 2.5 mg, Inhalation, Q4H PRN, Opyd, Lavone Neri, MD   amiodarone (PACERONE) tablet 200 mg, 200 mg, Oral, Daily, Opyd, Lavone Neri, MD, 200 mg at 03/26/19 0850   antiseptic oral rinse (BIOTENE) solution 15 mL, 15 mL, Mouth Rinse, Q2H PRN, Vann, Jessica U, DO   cefTRIAXone (ROCEPHIN) 2 g in sodium chloride 0.9 % 100 mL IVPB, 2 g, Intravenous, Q24H, Vann, Jessica U, DO, Stopped at 03/25/19 1733   famotidine (PEPCID) tablet 20 mg, 20 mg, Oral, BID, Opyd, Lavone Neri, MD, 20 mg at 03/26/19 0851   feeding supplement (PRO-STAT SUGAR FREE 64) liquid 30 mL, 30 mL, Oral, BID WC, Vann, Jessica U, DO, 30 mL at 03/26/19 0853   fentaNYL (SUBLIMAZE) injection 12.5-25 mcg, 12.5-25 mcg, Intravenous, Q2H PRN, Opyd, Lavone Neri, MD, 25 mcg at 03/22/19 0308   gabapentin (NEURONTIN) capsule 400 mg, 400 mg, Oral, TID, Opyd, Lavone Neri, MD, 400 mg at 03/26/19 0851   heparin ADULT infusion 100 units/mL (25000 units/240mL sodium chloride 0.45%), 1,950 Units/hr, Intravenous, Continuous, Hammons, Kimberly B, RPH, Last Rate: 19.5 mL/hr at 03/26/19 0000, 1,950 Units/hr at 03/26/19 0000   Melatonin TABS 3 mg, 3 mg, Oral, QHS PRN, Opyd, Lavone Neri, MD   midodrine (PROAMATINE) tablet 10 mg, 10 mg, Oral, TID WC, Opyd, Lavone Neri, MD, 10 mg at 03/26/19 0850   multivitamin with minerals tablet 1 tablet, 1 tablet, Oral, Daily, Vann, Jessica U, DO, 1 tablet at 03/25/19 1021   nutrition supplement (JUVEN) (JUVEN) powder packet 1 packet, 1 packet, Oral, BID BM, Vann, Jessica U, DO, 1 packet at 03/26/19 0920   ondansetron (ZOFRAN) injection 4 mg, 4 mg, Intravenous, Q6H PRN, Opyd, Lavone Neri, MD, 4 mg at 03/24/19 0949   potassium chloride (KLOR-CON) packet 20 mEq, 20 mEq, Oral, Daily, Vann, Jessica U, DO, 20 mEq at 03/26/19 0918   protein supplement (ENSURE MAX) liquid, 11 oz, Oral, QHS, Vann, Jessica U, DO, 11 oz at 03/25/19 2107   sodium chloride flush (NS) 0.9 % injection 3 mL, 3 mL, Intravenous, Q12H, Opyd, Timothy S, MD, 3 mL at 03/25/19 2107   sodium chloride flush (NS) 0.9 % injection 3 mL, 3 mL, Intravenous, Q12H, Opyd, Timothy S, MD, 3 mL at 03/25/19 2107   sodium chloride flush (NS) 0.9 % injection 3 mL, 3 mL, Intravenous, PRN, Opyd, Lavone Neri, MD   sodium chloride flush (NS) 0.9 % injection 5 mL, 5 mL, Intracatheter, Q8H, Henn, Adam, MD, 5 mL at 03/25/19 1351   umeclidinium bromide (INCRUSE ELLIPTA) 62.5 MCG/INH 1 puff, 1 puff, Inhalation, Daily, Opyd, Lavone Neri, MD    Vital Signs: BP (!) 110/49 (BP Location: Right Arm)    Pulse 66    Temp 98.6 F (37 C)    Resp  18    Ht 5\' 10"  (1.778 m)    Wt 95.3 kg    SpO2 95%    BMI 30.13 kg/m   Physical Exam  NAD, alert Abdomen: soft, non-tender, non-distended.   Back:  Left urinoma drain in place with mostly serous output, some purulent stranding. L PCN in place with clear urine output  Imaging: Ct Image Guided Drainage Percut Cath  Peritoneal Retroperit  Result Date: 03/22/2019 INDICATION: 85 year old with retroperitoneal complex fluid collection and left hydronephrosis. Patient has already undergone percutaneous left nephrostomy tube placement. Plan for CT-guided drainage of the  complex retroperitoneal fluid collection. EXAM: CT-GUIDED LEFT RETROPERITONEAL ABSCESS DRAIN MEDICATIONS: The patient is currently admitted to the hospital and receiving intravenous antibiotics. The antibiotics were administered within an appropriate time frame prior to the initiation of the procedure. ANESTHESIA/SEDATION: Fentanyl 50 mcg The patient was continuously monitored during the procedure by the interventional radiology nurse under my direct supervision. COMPLICATIONS: None immediate. PROCEDURE: Informed written consent was obtained from the patient and daughter after a thorough discussion of the procedural risks, benefits and alternatives. All questions were addressed. A timeout was performed prior to the initiation of the procedure. Patient was placed prone. CT images through the abdomen were obtained. The left retroperitoneal complex fluid collection was targeted. The left flank was prepped with chlorhexidine and sterile field was created. Skin and soft tissues were anesthetized with 1% lidocaine. Using CT guidance, a 18 gauge trocar needle was directed into the retroperitoneal fluid collection. Brown purulent fluid was aspirated. Stiff Amplatz wire was advanced into the collection. Tract was dilated to accommodate a 10 [place] drain. Additional brown purulent fluid was removed. Follow up CT images were obtained. Catheter was sutured to skin and attached to a suction bulb. FINDINGS: Left kidney has been decompressed with a percutaneous nephrostomy tube. Perinephric and left retroperitoneal complex fluid collection was targeted. The lateral aspect of the collection was punctured and purulent fluid was aspirated. Stiff Amplatz wire advanced into a portion of the collection and a 10 French drain was placed. Follow up CT images demonstrated that the drain may not fully decompress the more medial collections. IMPRESSION: CT-guided placement of a drainage catheter within the complex left retroperitoneal abscess  collection. Not clear if the most medial aspect of the abscess will be drained from this catheter. Recommend short-term follow-up imaging to evaluate this drainage catheter and the complex collection. Electronically Signed   By: Markus Daft M.D.   On: 03/22/2019 16:54    Labs:  CBC: Recent Labs    03/22/19 0450 03/24/19 0349 03/25/19 0639 03/26/19 0046  WBC 11.4* 7.6 7.8 8.6  HGB 10.5* 9.7* 10.4* 9.5*  HCT 33.3* 31.3* 34.3* 30.6*  PLT 302 341 322 327    COAGS: Recent Labs    03/22/19 0759 03/24/19 1241  INR 1.1  --   APTT  --  36    BMP: Recent Labs    04/08/2019 1749 03/22/19 0450 03/24/19 0349 03/26/19 0931  NA 142 141 143 137  K 2.9* 3.4* 3.8 4.0  CL 94* 97* 100 96*  CO2 37* 37* 35* 34*  GLUCOSE 111* 106* 93 137*  BUN 22 14 15 14   CALCIUM 8.1* 8.2* 8.4* 8.6*  CREATININE 0.74 0.62 0.53* 0.62  GFRNONAA >60 >60 >60 >60  GFRAA >60 >60 >60 >60    LIVER FUNCTION TESTS: Recent Labs    03/20/2019 1749  BILITOT 0.4  AST 19  ALT 15  ALKPHOS 64  PROT 5.6*  ALBUMIN 2.0*    Assessment and Plan: Left hydronephrosis s/p percutaneous nephrostomy tube placement 11/5 by Dr. Phillips Odor s/p drain placement by Dr. Lowella Dandy 11/5 Patient resting comfortably.  Both drains in place and intact.  Abscess drain output now more clear/serous Cultures positive in urine for Proteus, NGTD in abscess drain, reincubated. CBC pending. Afebrile.  On Rocephin. Continue current management.  IR following.   Patient may need re-imaging in the next few days to assess collections which were found to be complex by CT yesterday and may or may not be sufficiently drained with current abscess drain.   Electronically Signed: Brayton El, [name] 03/26/2019, 11:04 AM   I spent a total of 15 Minutes at the the patient's bedside AND on the patient's hospital floor or unit, greater than 50% of which was counseling/coordinating care for left hydronephrosis, urinoma.

## 2019-03-26 NOTE — Progress Notes (Signed)
[place] for heparin Indication: atrial fibrillation  Allergies  Allergen Reactions  . Nsaids Other (See Comments)    Stomach pain  . Morphine And Related Nausea And Vomiting and Other (See Comments)    Dizziness, light-headedness and "Opiates cause tightness in chest"  . Vicodin [Hydrocodone-Acetaminophen] Anxiety    Patient Measurements: Height: 5\' 10"  (177.8 cm) Weight: 217 lb 2.5 oz (98.5 kg) IBW/kg (Calculated) : 73 Heparin Dosing Weight: 92.3kg   Vital Signs: Temp: 99.1 F (37.3 C) (11/08 1941) Temp Source: Oral (11/08 1941) BP: 103/46 (11/08 1941) Pulse Rate: 66 (11/08 1941)  Labs: Recent Labs    03/24/19 0349 03/24/19 1241  03/25/19 0639 03/25/19 1558 03/26/19 0046  HGB 9.7*  --   --  10.4*  --  9.5*  HCT 31.3*  --   --  34.3*  --  30.6*  PLT 341  --   --  322  --  327  APTT  --  36  --   --   --   --   HEPARINUNFRC  --  <0.10*   < > <0.10* 0.10* 0.42  CREATININE 0.53*  --   --   --   --   --    < > = values in this interval not displayed.    Estimated Creatinine Clearance: 73.7 mL/min (A) (by C-G formula based on SCr of 0.53 mg/dL (L)).  Assessment: 85 y.o. male with h/o Afib, Xarelto on hold, for heparin Goal of Therapy:  Heparin level 0.3-0.7 units/ml Monitor platelets by anticoagulation protocol: Yes   Plan:  Continue Heparin at current rate  Phillis Knack, PharmD, BCPS  03/26/2019 1:42 AM

## 2019-03-26 NOTE — Progress Notes (Signed)
Paged MD Alma at [phone], informed patient BP 86/45 (57). 2L [place] 95%. A&Ox4, no distress, no pain. Sent MD [facility] page at [phone] stating 10 mg midodrine given as scheduled.   During shift assessment, patient readily engaging in convo, A&O x4. Stating son will visit at 10 AM. Patient talking about [employer] and working with mobility. Patient stated he has not walked in a while, but previously used a rollator.  Pt son [name] at bedside and requested call from MD with updates. Paged MD Izetta Dakin at [phone];informed patient son [name] is currently at bedside, is requesting updates from MD. son cell # [phone]  Son at beside 1615 and stated had still not heard from [facility]. Paged ALam at [phone] Pt. son requesting call with plan of care updates. Son is [name] [phone] and still at bedside.

## 2019-03-27 ENCOUNTER — Inpatient Hospital Stay (HOSPITAL_COMMUNITY): Payer: Medicare Other

## 2019-03-27 LAB — BASIC METABOLIC PANEL
Anion gap: 7 (ref 5–15)
BUN: 19 mg/dL (ref 8–23)
CO2: 36 mmol/L — ABNORMAL HIGH (ref 22–32)
Calcium: 9 mg/dL (ref 8.9–10.3)
Chloride: 96 mmol/L — ABNORMAL LOW (ref 98–111)
Creatinine, Ser: 0.56 mg/dL — ABNORMAL LOW (ref 0.61–1.24)
GFR calc Af Amer: >60 mL/min (ref >60)
GFR calc non Af Amer: >60 mL/min (ref >60)
Glucose, Bld: 105 mg/dL — ABNORMAL HIGH (ref 70–99)
Potassium: 3.9 mmol/L (ref 3.5–5.1)
Sodium: 139 mmol/L (ref 135–145)

## 2019-03-27 LAB — AEROBIC/ANAEROBIC CULTURE W GRAM STAIN (SURGICAL/DEEP WOUND)

## 2019-03-27 LAB — CBC
HCT: 31.1 % — ABNORMAL LOW (ref 39.0–52.0)
Hemoglobin: 9.5 g/dL — ABNORMAL LOW (ref 13.0–17.0)
MCH: 31.3 pg (ref 26.0–34.0)
MCHC: 30.5 g/dL (ref 30.0–36.0)
MCV: 102.3 fL — ABNORMAL HIGH (ref 80.0–100.0)
Platelets: 325 10*3/uL (ref 150–400)
RBC: 3.04 MIL/uL — ABNORMAL LOW (ref 4.22–5.81)
RDW: 13.9 % (ref 11.5–15.5)
WBC: 8.1 10*3/uL (ref 4.0–10.5)
nRBC: 0 % (ref 0.0–0.2)

## 2019-03-27 LAB — HEPARIN LEVEL (UNFRACTIONATED): Heparin Unfractionated: 0.49 IU/mL (ref 0.30–0.70)

## 2019-03-27 MED ORDER — IOHEXOL 300 MG/ML  SOLN
100.0000 mL | Freq: Once | INTRAMUSCULAR | Status: AC | PRN
  Administered 2019-03-27: 100 mL via INTRAVENOUS

## 2019-03-27 NOTE — Progress Notes (Signed)
PROGRESS NOTE    Antavius Sperbeck Youngberg  NKN:[id number] DOB: May 01, 1927 DOA: 04/15/2019 PCP: System, Pcp Not In   Brief Narrative:   85 y.o. male with medical history significant foratrial fibrillation on Xarelto, chronic diastolic CHF, asthma/COPD, OSA on BiPAP, hypotension on midodrine, and chronic sacral ulcer, now presenting to the emergency department for evaluation of progressive abdominal distention with alternating constipation and watery stool.  Found to have perinephric fluid collection requiring nephrostomy tube.   Assessment & Plan:   Principal Problem:   Perinephric fluid collection Active Problems:   Asthma   Paroxysmal Atrial fibrillation - admitted with RVR   Chronic diastolic CHF (congestive heart failure) (HCC)   AF (paroxysmal atrial fibrillation) (HCC)   Hypokalemia   Ileus (HCC)   Closed compression fracture of L4 vertebra (HCC)   Sacral pressure ulcer   Renal cyst, left   Abdominal pain secondary to perinephric fluid collection Left-sided hydronephrosis/urinoma/abscess status post drain placement 11/5 -Status post Left nephrostomy tube -Final culture shows multiple resistance  -Continue antibiotics-IV Rocephin; will follow urology recs  - Nephrostomy tube and drain to gravity drainage -Appreciate urology input.  - IR input appreciated; Repeat CT of the abdomen pelvis on Monday/Tuesday as per IR - May need a left ureteroscopy/laser lithotripsy, ureteral stent placement per Urology  -Resume Xarelto when cleared.  IV heparin in the meantime -Reimaging few days to reassess the collection  L4 compression fracture, acute/subacute -Denies any recent fall or trauma.  Pain control if it becomes necessary.  Bowel regimen.  No neurologic compromise at this time -Supportive care.  Follow-up outpatient.  Ileus, improved  Chronic atrial fibrillation -Xarelto held prior to the procedure. Cont heparin gtt for now -  If no further procedure plans by urology, will resume  Xarelto. -Continue amiodarone -Once normal procedure plan, resume Xarelto  Left renal cyst -Outpatient nonemergent MRI  History of COPD -Not in active exacerbation.  Resume bronchodilators.  BiPAP at bedtime  Chronic sacral pressure ulcer, stage IV, present on admission -Wound care team following.  Chronic neuropathy, peripheral -Gabapentin 400 mg 3 times daily  Hypotension: On 03/26/19, SBP in 80's per nurse. Pt had no complaints and was resting comfortably.  - BP stable range now  - Pt received his Midodrine dose as scheduled  - Cont to monitor  - Cont Midodrine as scheduled   Spoke with patient's daughter and updated her on patient's current status and plan  DVT prophylaxis: Heparin drip for now.  Eventually resume Xarelto Code Status: Partial code Family Communication: None Disposition Plan: Maintain hospital stay to manage his nephrostomy tube and the drain.  Repeat CT on Monday or Tuesday.  Consultants:   IR  Urology  Procedures:   Nephrostomy placed 11/5  Antimicrobials:   IV Rocephin   Subjective: No acute issues.  Denies any complaints. JB bag and drainage bags have serous liquid. No fever.   Review of Systems Otherwise negative except as per HPI, including: General: Denies fever, chills, night sweats or unintended weight loss. Resp: Denies cough, wheezing, shortness of breath. Cardiac: Denies chest pain, palpitations, orthopnea, paroxysmal nocturnal dyspnea. GI: Denies abdominal pain, nausea, vomiting, diarrhea or constipation GU: Denies dysuria, frequency, hesitancy or incontinence MS: Denies muscle aches, joint pain or swelling Neuro: Denies headache, neurologic deficits (focal weakness, numbness, tingling), abnormal gait Psych: Denies anxiety, depression, SI/HI/AVH Skin: Denies new rashes or lesions ID: Denies sick contacts, exotic exposures, travel  Objective: Vitals:   03/27/19 0419 03/27/19 0519 03/27/19 0732 03/27/19 1231  BP:  108/90  (!)  109/40 (!) 104/47  Pulse: 69 72 62 63  Resp: 19   18  Temp: (!) 97.5 F (36.4 C)  98.6 F (37 C) 98.2 F (36.8 C)  TempSrc: Oral  Oral Oral  SpO2: (!) 76% 94% 95% 95%  Weight: 96.3 kg     Height:        Intake/Output Summary (Last 24 hours) at 03/27/2019 1321 Last data filed at 03/27/2019 1250 Gross per 24 hour  Intake 2339.12 ml  Output 2780 ml  Net -440.88 ml   Filed Weights   03/25/19 0510 03/26/19 0508 03/27/19 0419  Weight: 98.5 kg 95.3 kg 96.3 kg    Examination:  General exam: Not in acute distress.  Appears chronically ill. Respiratory system: Clear to auscultation bilaterally Cardiovascular system: Normal sinus rhythm Gastrointestinal system: Abdomen is very minimally distended but has positive bowel sounds. GU: Has a left nephrostomy tube and drain bag which has my minimal to moderate urine collected.  No obvious purulent materials or bleeding.  Tube insertion site appears to be dry and clean. Central nervous system: Alert and oriented. No focal neurological deficits. Extremities: Symmetric 4 x 5 power.  No edema Skin: No rashes Psychiatry: Judgement and insight appear normal. Mood & affect appropriate.   Left-sided nephrostomy tube Chronic stage IV sacral ulcer External urinary catheter  Data Reviewed:   CBC: Recent Labs  Lab 03/30/2019 1749 03/22/19 0450 03/24/19 0349 03/25/19 0639 03/26/19 0046 03/27/19 0405  WBC 10.6* 11.4* 7.6 7.8 8.6 8.1  NEUTROABS 7.4 8.7*  --   --   --   --   HGB 10.9* 10.5* 9.7* 10.4* 9.5* 9.5*  HCT 34.3* 33.3* 31.3* 34.3* 30.6* 31.1*  MCV 99.1 100.0 100.6* 102.1* 102.3* 102.3*  PLT 279 302 341 322 327 671   Basic Metabolic Panel: Recent Labs  Lab 04/06/2019 1749 03/22/19 0450 03/24/19 0349 03/26/19 0931 03/27/19 0405  NA 142 141 143 137 139  K 2.9* 3.4* 3.8 4.0 3.9  CL 94* 97* 100 96* 96*  CO2 37* 37* 35* 34* 36*  GLUCOSE 111* 106* 93 137* 105*  BUN 22 14 15 14 19   CREATININE 0.74 0.62 0.53* 0.62 0.56*  CALCIUM  8.1* 8.2* 8.4* 8.6* 9.0  MG  --  2.1  --   --   --    GFR: Estimated Creatinine Clearance: 72.9 mL/min (A) (by C-G formula based on SCr of 0.56 mg/dL (L)). Liver Function Tests: Recent Labs  Lab 03/20/2019 1749  AST 19  ALT 15  ALKPHOS 64  BILITOT 0.4  PROT 5.6*  ALBUMIN 2.0*   Recent Labs  Lab 04/14/2019 1749  LIPASE 20   No results for input(s): AMMONIA in the last 168 hours. Coagulation Profile: Recent Labs  Lab 03/22/19 0759  INR 1.1   Cardiac Enzymes: No results for input(s): CKTOTAL, CKMB, CKMBINDEX, TROPONINI in the last 168 hours. BNP (last 3 results) No results for input(s): PROBNP in the last 8760 hours. HbA1C: No results for input(s): HGBA1C in the last 72 hours. CBG: No results for input(s): GLUCAP in the last 168 hours. Lipid Profile: No results for input(s): CHOL, HDL, LDLCALC, TRIG, CHOLHDL, LDLDIRECT in the last 72 hours. Thyroid Function Tests: No results for input(s): TSH, T4TOTAL, FREET4, T3FREE, THYROIDAB in the last 72 hours. Anemia Panel: No results for input(s): VITAMINB12, FOLATE, FERRITIN, TIBC, IRON, RETICCTPCT in the last 72 hours. Sepsis Labs: Recent Labs  Lab 04/16/2019 1749  LATICACIDVEN 0.8    Recent Results (  from the past 240 hour(s))  SARS CORONAVIRUS 2 (TAT 6-24 HRS) Nasopharyngeal Nasopharyngeal Swab     Status: None   Collection Time: 04/09/2019  5:54 PM   Specimen: Nasopharyngeal Swab  Result Value Ref Range Status   SARS Coronavirus 2 NEGATIVE NEGATIVE Final    Comment: (NOTE) SARS-CoV-2 target nucleic acids are NOT DETECTED. The SARS-CoV-2 RNA is generally detectable in upper and lower respiratory specimens during the acute phase of infection. Negative results do not preclude SARS-CoV-2 infection, do not rule out co-infections with other pathogens, and should not be used as the sole basis for treatment or other patient management decisions. Negative results must be combined with clinical observations, patient history, and  epidemiological information. The expected result is Negative. Fact Sheet for Patients: HairSlick.nohttps://www.fda.gov/media/138098/download Fact Sheet for Healthcare Providers: quierodirigir.comhttps://www.fda.gov/media/138095/download This test is not yet approved or cleared by the [place] FDA and  has been authorized for detection and/or diagnosis of SARS-CoV-2 by FDA under an Emergency Use Authorization (EUA). This EUA will remain  in effect (meaning this test can be used) for the duration of the COVID-19 declaration under Section 56 4(b)(1) of the Act, 21 U.S.C. section 360bbb-3(b)(1), unless the authorization is terminated or revoked sooner. Performed at [facility], [address] [place], [place] [address]   UCx     Status: Abnormal   Collection Time: 04/01/2019  7:43 PM   Specimen: Urine, Random  Result Value Ref Range Status   Specimen Description URINE, RANDOM  Final   Special Requests NONE  Final   Culture (A)  Final    <10,000 COLONIES/mL INSIGNIFICANT GROWTH Performed at [facility], [address] [place], [place] [address]    Report Status 03/22/2019 FINAL  Final  Urine culture     Status: Abnormal   Collection Time: 03/22/19 11:20 AM   Specimen: Kidney; Urine  Result Value Ref Range Status   Specimen Description KIDNEY LEFT  Final   Special Requests   Final    NONE Performed at [facility], [address] [place], [place] [address]    Culture 100 COLONIES/mL PROTEUS MIRABILIS (A)  Final   Report Status 03/25/2019 FINAL  Final   Organism ID, Bacteria PROTEUS MIRABILIS (A)  Final      Susceptibility   Proteus mirabilis - MIC*    AMPICILLIN >=32 RESISTANT Resistant     CEFAZOLIN 16 SENSITIVE Sensitive     CEFEPIME <=1 SENSITIVE Sensitive     CEFTAZIDIME <=1 SENSITIVE Sensitive     CEFTRIAXONE <=1 SENSITIVE Sensitive     CIPROFLOXACIN >=4 RESISTANT Resistant     GENTAMICIN <=1 SENSITIVE Sensitive     IMIPENEM 2 SENSITIVE Sensitive     TRIMETH/SULFA >=320  RESISTANT Resistant     AMPICILLIN/SULBACTAM >=32 RESISTANT Resistant     PIP/TAZO <=4 SENSITIVE Sensitive     * 100 COLONIES/mL PROTEUS MIRABILIS  Aerobic/Anaerobic Culture (surgical/deep wound)     Status: None   Collection Time: 03/22/19  3:14 PM   Specimen: Abscess  Result Value Ref Range Status   Specimen Description ABSCESS  Final   Special Requests LEFT RETROPERITONEAL  Final   Gram Stain   Final    ABUNDANT WBC PRESENT,BOTH PMN AND MONONUCLEAR NO ORGANISMS SEEN    Culture   Final    RARE PROTEUS MIRABILIS NO ANAEROBES ISOLATED Performed at [facility], [address] [place], [place] [address]    Report Status 03/27/2019 FINAL  Final  Organism ID, Bacteria PROTEUS MIRABILIS  Final      Susceptibility   Proteus mirabilis - MIC*    AMPICILLIN >=32 RESISTANT Resistant     CEFAZOLIN 16 SENSITIVE Sensitive     CEFEPIME <=1 SENSITIVE Sensitive     CEFTAZIDIME <=1 SENSITIVE Sensitive     CEFTRIAXONE <=1 SENSITIVE Sensitive     CIPROFLOXACIN >=4 RESISTANT Resistant     GENTAMICIN <=1 SENSITIVE Sensitive     IMIPENEM 2 SENSITIVE Sensitive     TRIMETH/SULFA >=320 RESISTANT Resistant     AMPICILLIN/SULBACTAM >=32 RESISTANT Resistant     PIP/TAZO <=4 SENSITIVE Sensitive     * RARE PROTEUS MIRABILIS  MRSA PCR Screening     Status: None   Collection Time: 03/22/19  5:09 PM   Specimen: Nasopharyngeal  Result Value Ref Range Status   MRSA by PCR NEGATIVE NEGATIVE Final    Comment:        The GeneXpert MRSA Assay (FDA approved for NASAL specimens only), is one component of a comprehensive MRSA colonization surveillance program. It is not intended to diagnose MRSA infection nor to guide or monitor treatment for MRSA infections. Performed at [facility], [address] [place], [place] [address]          Radiology Studies: No results found.      Scheduled Meds: . amiodarone  200 mg Oral Daily  . famotidine  20 mg Oral BID  . feeding supplement  (PRO-STAT SUGAR FREE 64)  30 mL Oral BID WC  . gabapentin  400 mg Oral TID  . midodrine  10 mg Oral TID WC  . multivitamin with minerals  1 tablet Oral Daily  . nutrition supplement (JUVEN)  1 packet Oral BID BM  . potassium chloride  20 mEq Oral Daily  . Ensure Max Protein  11 oz Oral QHS  . sodium chloride flush  3 mL Intravenous Q12H  . sodium chloride flush  3 mL Intravenous Q12H  . sodium chloride flush  5 mL Intracatheter Q8H  . umeclidinium bromide  1 puff Inhalation Daily   Continuous Infusions: . sodium chloride 250 mL (03/26/19 2244)  . cefTRIAXone (ROCEPHIN)  IV 2 g (03/26/19 1607)  . heparin 1,950 Units/hr (03/27/19 0117)     LOS: 5 days   Time spent= 25 mins    Thomasenia Bottoms, MD [facility]  If 7PM-7AM, please contact night-coverage  03/27/2019, 1:21 PM

## 2019-03-27 NOTE — Progress Notes (Signed)
Paged Alam at [phone]. Pt. daughter Stanton Kidney at bedside [phone] for updates,such as CT scan today?   During rounds at 1100 pt.s daughter Angela Nevin at bedside, inquired about patient on CPAP at night and that patient stated had difficulty breathing prior night with CPap. Daughter stated patient should be on Bipap at night per outpatient pulmonology. This RN confirmed orders for Bipap at night and informed daughter will reiterate orders to night RN. RN called RT at 1133 and informed RT to ensure patient placed on Bipap at night. Informed night RN to ensure patient on Bipap.   Patient returned to unit from CT at 1600.   Spoke with RT via phone at [place] about incruse ellipta inhaler not given, RT stated will inform next RT to administer as scheduled and will chart against medication.

## 2019-03-27 NOTE — Progress Notes (Signed)
ANTICOAGULATION CONSULT NOTE - Follow-Up Consult  Pharmacy Consult for heparin Indication: atrial fibrillation  Allergies  Allergen Reactions  . Nsaids Other (See Comments)    Stomach pain  . Morphine And Related Nausea And Vomiting and Other (See Comments)    Dizziness, light-headedness and "Opiates cause tightness in chest"  . Vicodin [Hydrocodone-Acetaminophen] Anxiety    Patient Measurements: Height: 5\' 10"  (177.8 cm) Weight: 212 lb 4.9 oz (96.3 kg) IBW/kg (Calculated) : 73 Heparin Dosing Weight: 92.3kg   Vital Signs: Temp: 98.6 F (37 C) (11/10 0732) Temp Source: Oral (11/10 0732) BP: 109/40 (11/10 0732) Pulse Rate: 62 (11/10 0732)  Labs: Recent Labs    03/24/19 1241  03/25/19 0639  03/26/19 0046 03/26/19 0931 03/27/19 0405  HGB  --    < > 10.4*  --  9.5*  --  9.5*  HCT  --   --  34.3*  --  30.6*  --  31.1*  PLT  --   --  322  --  327  --  325  APTT 36  --   --   --   --   --   --   HEPARINUNFRC <0.10*   < > <0.10*   < > 0.42 0.45 0.49  CREATININE  --   --   --   --   --  0.62 0.56*   < > = values in this interval not displayed.    Estimated Creatinine Clearance: 72.9 mL/min (A) (by C-G formula based on SCr of 0.56 mg/dL (L)).   Medical History: Past Medical History:  Diagnosis Date  . A-fib (HCC)   . Acute systolic CHF (congestive heart failure), NYHA class 3 -- Unclear etilogy (? Afib related) EF down from 60-70% to 40%. 07/05/2012   Echo 2/21: LV upper limits of normal. EF- 40%. Cannot assess Diastolic function - Aortic Sclerosis, Mod-Severe Left Atrial dilation; Mild RV & RA dilation; Moderately elevated PA peak pressure: 110mm Hg     . Arthritis    knees  . Asthma   . ASTHMA 02/06/2007   Qualifier: Diagnosis of  By: 02/08/2007 CMA, Claiborne Billings    . BENIGN PROSTATIC HYPERTROPHY 05/13/2010   Qualifier: Diagnosis of  By: 05/15/2010  MD, Amador Cunas   . Bifascicular block 12/18/2013  . BPH (benign prostatic hypertrophy)   . Bronchospasm, exercise-induced   .  CHF (congestive heart failure) (HCC)    systolic, EF 40% (07/07/2012)  . Chronic kidney disease    stabilized, due to infection  . Chronic rhinitis 06/02/2009   Qualifier: Diagnosis of  By: 06/04/2009 MD, Lovell Sheehan   . DJD (degenerative joint disease), lumbar 01/18/2012  . Dyslipidemia   . Edema    Bilateral - left lower leg greater than right- wears compression stockings  . General weakness 07/05/2012  . GERD (gastroesophageal reflux disease)   . History of elevated glucose 10/01/2009   Qualifier: Diagnosis of  By: 10/03/2009 MD, Lovell Sheehan   . Hx of colonic polyps 11/02/2011  . Hypertension   . INSOMNIA, CHRONIC, MILD 01/02/2008   Qualifier: Diagnosis of  By: 01/04/2008 MD, Lovell Sheehan   . Macular degeneration 08-06-13   bilateral -sight impaired  . Obesity   . Paroxysmal Atrial fibrillation - admitted with RVR 01/18/2012  . S/P left THA, AA 08/13/2013  . Shortness of breath     Medications:  Scheduled:  . amiodarone  200 mg Oral Daily  . famotidine  20 mg Oral BID  . feeding supplement (PRO-STAT  SUGAR FREE 64)  30 mL Oral BID WC  . gabapentin  400 mg Oral TID  . midodrine  10 mg Oral TID WC  . multivitamin with minerals  1 tablet Oral Daily  . nutrition supplement (JUVEN)  1 packet Oral BID BM  . potassium chloride  20 mEq Oral Daily  . Ensure Max Protein  11 oz Oral QHS  . sodium chloride flush  3 mL Intravenous Q12H  . sodium chloride flush  3 mL Intravenous Q12H  . sodium chloride flush  5 mL Intracatheter Q8H  . umeclidinium bromide  1 puff Inhalation Daily   Infusions:  . sodium chloride 250 mL (03/26/19 2244)  . cefTRIAXone (ROCEPHIN)  IV 2 g (03/26/19 1607)  . heparin 1,950 Units/hr (03/27/19 0117)    Assessment: 69 yoM admitted 11/4 with perinephric fluid collection requiring nephrostomy tube. Pt on Xarelto PTA for hx AFib (CHADSVASc = 3) which has been held with need for procedures and bridged with IV heparin. Last dose of Xarelto was 11/4 am.  Heparin remains therapeutic, CBC  stable.  Goal of Therapy:  Heparin level 0.3-0.7 units/ml Monitor platelets by anticoagulation protocol: Yes   Plan:  -Continue heparin at 1950 units/hr  -Daily heparin level, CBC, s/s bleeding -F/u ability to transition back to [place], PharmD, BCPS Clinical Pharmacist [phone] Please check AMION for all [facility] Pharmacy numbers 03/27/2019

## 2019-03-28 ENCOUNTER — Encounter (HOSPITAL_COMMUNITY): Payer: Self-pay | Admitting: Physician Assistant

## 2019-03-28 ENCOUNTER — Inpatient Hospital Stay (HOSPITAL_COMMUNITY): Payer: Medicare Other

## 2019-03-28 LAB — BASIC METABOLIC PANEL
Anion gap: 7 (ref 5–15)
BUN: 18 mg/dL (ref 8–23)
CO2: 36 mmol/L — ABNORMAL HIGH (ref 22–32)
Calcium: 8.9 mg/dL (ref 8.9–10.3)
Chloride: 97 mmol/L — ABNORMAL LOW (ref 98–111)
Creatinine, Ser: 0.58 mg/dL — ABNORMAL LOW (ref 0.61–1.24)
GFR calc Af Amer: >60 mL/min (ref >60)
GFR calc non Af Amer: >60 mL/min (ref >60)
Glucose, Bld: 103 mg/dL — ABNORMAL HIGH (ref 70–99)
Potassium: 3.8 mmol/L (ref 3.5–5.1)
Sodium: 140 mmol/L (ref 135–145)

## 2019-03-28 LAB — CBC
HCT: 32 % — ABNORMAL LOW (ref 39.0–52.0)
Hemoglobin: 9.8 g/dL — ABNORMAL LOW (ref 13.0–17.0)
MCH: 31.3 pg (ref 26.0–34.0)
MCHC: 30.6 g/dL (ref 30.0–36.0)
MCV: 102.2 fL — ABNORMAL HIGH (ref 80.0–100.0)
Platelets: 312 10*3/uL (ref 150–400)
RBC: 3.13 MIL/uL — ABNORMAL LOW (ref 4.22–5.81)
RDW: 14.2 % (ref 11.5–15.5)
WBC: 7.9 10*3/uL (ref 4.0–10.5)
nRBC: 0 % (ref 0.0–0.2)

## 2019-03-28 LAB — HEPARIN LEVEL (UNFRACTIONATED): Heparin Unfractionated: 0.55 IU/mL (ref 0.30–0.70)

## 2019-03-28 MED ORDER — SODIUM CHLORIDE 0.9% FLUSH
5.0000 mL | Freq: Three times a day (TID) | INTRAVENOUS | Status: DC
  Administered 2019-03-28: 18:00:00 5 mL

## 2019-03-28 MED ORDER — MIDAZOLAM HCL 2 MG/2ML IJ SOLN
INTRAMUSCULAR | Status: AC | PRN
  Administered 2019-03-28 (×2): 0.5 mg via INTRAVENOUS

## 2019-03-28 MED ORDER — FENTANYL CITRATE (PF) 100 MCG/2ML IJ SOLN
INTRAMUSCULAR | Status: AC | PRN
  Administered 2019-03-28: 12.5 ug via INTRAVENOUS

## 2019-03-28 MED ORDER — FENTANYL CITRATE (PF) 100 MCG/2ML IJ SOLN
INTRAMUSCULAR | Status: AC
  Filled 2019-03-28: qty 2

## 2019-03-28 MED ORDER — MIDAZOLAM HCL 2 MG/2ML IJ SOLN
INTRAMUSCULAR | Status: AC
  Filled 2019-03-28: qty 2

## 2019-03-28 MED ORDER — HEPARIN (PORCINE) 25000 UT/250ML-% IV SOLN
1950.0000 [IU]/h | INTRAVENOUS | Status: DC
  Administered 2019-03-28 – 2019-03-31 (×6): 1950 [IU]/h via INTRAVENOUS
  Administered 2019-04-01: 2000 [IU]/h via INTRAVENOUS
  Administered 2019-04-02: 2050 [IU]/h via INTRAVENOUS
  Administered 2019-04-02: 2000 [IU]/h via INTRAVENOUS
  Administered 2019-04-03 – 2019-04-07 (×8): 2050 [IU]/h via INTRAVENOUS
  Administered 2019-04-08 – 2019-04-12 (×7): 2000 [IU]/h via INTRAVENOUS
  Administered 2019-04-12 – 2019-04-13 (×2): 1950 [IU]/h via INTRAVENOUS
  Filled 2019-03-28 (×25): qty 250

## 2019-03-28 MED ORDER — SODIUM CHLORIDE 0.9% FLUSH
5.0000 mL | Freq: Three times a day (TID) | INTRAVENOUS | Status: DC
  Administered 2019-03-28: 5 mL

## 2019-03-28 NOTE — Progress Notes (Signed)
ANTICOAGULATION CONSULT NOTE - [place] for heparin Indication: atrial fibrillation  Allergies  Allergen Reactions  . Nsaids Other (See Comments)    Stomach pain  . Morphine And Related Nausea And Vomiting and Other (See Comments)    Dizziness, light-headedness and "Opiates cause tightness in chest"  . Vicodin [Hydrocodone-Acetaminophen] Anxiety    Patient Measurements: Height: 5\' 10"  (177.8 cm) Weight: 216 lb 4.3 oz (98.1 kg) IBW/kg (Calculated) : 73 Heparin Dosing Weight: 92.3kg   Vital Signs: Temp: 98.7 F (37.1 C) (11/11 0543) Temp Source: Oral (11/11 0543) BP: 109/55 (11/11 0543) Pulse Rate: 65 (11/11 0543)  Labs: Recent Labs    03/26/19 0046 03/26/19 0931 03/27/19 0405 03/28/19 0336  HGB 9.5*  --  9.5* 9.8*  HCT 30.6*  --  31.1* 32.0*  PLT 327  --  325 312  HEPARINUNFRC 0.42 0.45 0.49 0.55  CREATININE  --  0.62 0.56* 0.58*    Estimated Creatinine Clearance: 73.5 mL/min (A) (by C-G formula based on SCr of 0.58 mg/dL (L)).   Medical History: Past Medical History:  Diagnosis Date  . A-fib ([place])   . Acute systolic CHF (congestive heart failure), NYHA class 3 -- Unclear etilogy (? Afib related) EF down from 60-70% to 40%. 07/05/2012   Echo 2/21: LV upper limits of normal. EF- 40%. Cannot assess Diastolic function - Aortic Sclerosis, Mod-Severe Left Atrial dilation; Mild RV & RA dilation; Moderately elevated PA peak pressure: 41mm Hg     . Arthritis    knees  . Asthma   . ASTHMA 02/06/2007   Qualifier: Diagnosis of  By: Rogue Bussing CMA, Maryann Alar    . BENIGN PROSTATIC HYPERTROPHY 05/13/2010   Qualifier: Diagnosis of  By: Burnice Logan  MD, Doretha Sou   . Bifascicular block 12/18/2013  . BPH (benign prostatic hypertrophy)   . Bronchospasm, exercise-induced   . CHF (congestive heart failure) (HCC)    systolic, EF 06% (2/69/4854)  . Chronic kidney disease    stabilized, due to infection  . Chronic rhinitis 06/02/2009   Qualifier: Diagnosis  of  By: Arnoldo Morale MD, Balinda Quails   . DJD (degenerative joint disease), lumbar 01/18/2012  . Dyslipidemia   . Edema    Bilateral - left lower leg greater than right- wears compression stockings  . General weakness 07/05/2012  . GERD (gastroesophageal reflux disease)   . History of elevated glucose 10/01/2009   Qualifier: Diagnosis of  By: Arnoldo Morale MD, Balinda Quails   . Hx of colonic polyps 11/02/2011  . Hypertension   . INSOMNIA, CHRONIC, MILD 01/02/2008   Qualifier: Diagnosis of  By: Arnoldo Morale MD, Balinda Quails   . Macular degeneration 08-06-13   bilateral -sight impaired  . Obesity   . Paroxysmal Atrial fibrillation - admitted with RVR 01/18/2012  . S/P left THA, AA 08/13/2013  . Shortness of breath     Medications:  Scheduled:  . amiodarone  200 mg Oral Daily  . famotidine  20 mg Oral BID  . feeding supplement (PRO-STAT SUGAR FREE 64)  30 mL Oral BID WC  . gabapentin  400 mg Oral TID  . midodrine  10 mg Oral TID WC  . multivitamin with minerals  1 tablet Oral Daily  . nutrition supplement (JUVEN)  1 packet Oral BID BM  . potassium chloride  20 mEq Oral Daily  . Ensure Max Protein  11 oz Oral QHS  . sodium chloride flush  3 mL Intravenous Q12H  . sodium chloride flush  3 mL  Intravenous Q12H  . sodium chloride flush  5 mL Intracatheter Q8H  . umeclidinium bromide  1 puff Inhalation Daily   Infusions:  . sodium chloride Stopped (03/27/19 1803)  . cefTRIAXone (ROCEPHIN)  IV 2 g (03/27/19 1726)  . heparin 1,950 Units/hr (03/28/19 0142)    Assessment: 66 yoM admitted 11/4 with perinephric fluid collection requiring nephrostomy tube. Pt on Xarelto PTA for hx AFib (CHADSVASc = 3) which has been held with need for procedures and bridged with IV heparin. Last dose of Xarelto was 11/4 am.  Heparin remains at goal, CBC is stable.  Goal of Therapy:  Heparin level 0.3-0.7 units/ml Monitor platelets by anticoagulation protocol: Yes   Plan:  -Continue heparin at 1950 units/hr  -Daily heparin level, CBC, s/s  bleeding -F/u ability to transition back to Xarelto  Sheppard Coil PharmD., BCPS Clinical Pharmacist 03/28/2019 8:42 AM

## 2019-03-28 NOTE — Procedures (Signed)
Pre procedural Dx: Perinephric abscess Post procedural Dx: Same  Technically successful CT guided placed of a 10 Fr drainage catheter placement into the residual abscess inferior to the left kidney yielding 30 cc of purulent appearing fluid.    All aspirated samples sent to the laboratory for analysis.    EBL: None Complications: None immediate  Ronny Bacon, MD Pager #: [phone]

## 2019-03-28 NOTE — Progress Notes (Signed)
Nutrition Follow-up  DOCUMENTATION CODES:   Not applicable  INTERVENTION:   -Continue MVI with minerals daily -Continue 30 ml Prostat BID with meals, each supplement provides 100 kcals and 15 grams protein -Continue Ensure Max po q HS, each supplement provides 150 kcal and 30 grams of protein -Continue 1 packet Juven BID, each packet provides 95 calories, 2.5 grams of protein (collagen), and 9.8 grams of carbohydrate (3 grams sugar); also contains 7 grams of L-arginine and L-glutamine, 300 mg vitamin C, 15 mg vitamin E, 1.2 mcg vitamin B-12, 9.5 mg zinc, 200 mg calcium, and 1.5 g  Calcium Beta-hydroxy-Beta-methylbutyrate to support wound healing   NUTRITION DIAGNOSIS:   Increased nutrient needs related to wound healing as evidenced by estimated needs.  Ongoing  GOAL:   Patient will meet greater than or equal to 90% of their needs  Progressing   MONITOR:   PO intake, Supplement acceptance, Labs, Weight trends, Skin, I & O's  REASON FOR ASSESSMENT:   Low Braden    ASSESSMENT:   [name] is a 85 y.o. male with medical history significant for atrial fibrillation on Xarelto, chronic diastolic CHF, asthma/COPD, OSA on BiPAP, hypotension on midodrine, and chronic sacral ulcer, now presenting to the emergency department for evaluation of progressive abdominal distention with alternating constipation and watery stool.  Patient is accompanied by his daughter who assists with the history.  Patient reportedly developed constipation several days ago, reports some abdominal distention since then, and has had a couple watery stools.  He denies any abdominal pain but reports some mild generalized discomfort.  He denies nausea or vomiting but his daughter notes that he vomited a small amount earlier.  Patient denies any fevers, chills, flank pain, or dysuria.  He denies any change in his chronic back pain.  11/5- s/p lt nephrostomy tube placement, CT guided abscess drain  placement  Reviewed I/O's: -1.8 L x 24 hours and -200 ml since admission  UOP: 3.6 L x 24 hours  Drain output: 40 ml x 24 hours  Per IR notes, pt non-drained component of abscess; plan to place additional drain today.   Pt receiving nursing care at time of visit. Case discussed with RN, who reports pt with some confusion. He has has a good appetite and consuming meals well. Noted meal completion 25-95%. Pt is compliant with meds and supplements.   Labs reviewed.    Diet Order:   Diet Order            Diet NPO time specified  Diet effective now              EDUCATION NEEDS:   No education needs have been identified at this time  Skin:  Skin Assessment: Skin Integrity Issues: Skin Integrity Issues:: Stage IV Stage IV: sacrum  Last BM:  03/28/19  Height:   Ht Readings from Last 1 Encounters:  03/22/19 5\' 10"  (1.778 m)    Weight:   Wt Readings from Last 1 Encounters:  03/28/19 98.1 kg    Ideal Body Weight:  75.5 kg  BMI:  Body mass index is 31.03 kg/m.  Estimated Nutritional Needs:   Kcal:  2050-2250  Protein:  100-115 grams  Fluid:  > 2 L    Ellary Casamento A. Jimmye Norman, RD, LDN, [place] Registered Dietitian II Certified Diabetes Care and Education Specialist Pager: [phone] After hours Pager: [phone]

## 2019-03-28 NOTE — Progress Notes (Signed)
PROGRESS NOTE    [name]  ZOX:[id number] DOB: 11-17-1926 DOA: 04/06/2019 PCP: System, Pcp Not In   Brief Narrative:   85 y.o. male with medical history significant foratrial fibrillation on Xarelto, chronic diastolic CHF, asthma/COPD, OSA on BiPAP, hypotension on midodrine, and chronic sacral ulcer, now presenting to the emergency department for evaluation of progressive abdominal distention with alternating constipation and watery stool.  Found to have perinephric fluid collection requiring nephrostomy tube.   Assessment & Plan:   Principal Problem:   Perinephric fluid collection Active Problems:   Asthma   Paroxysmal Atrial fibrillation - admitted with RVR   Chronic diastolic CHF (congestive heart failure) (HCC)   AF (paroxysmal atrial fibrillation) (HCC)   Hypokalemia   Ileus (HCC)   Closed compression fracture of L4 vertebra (HCC)   Sacral pressure ulcer   Renal cyst, left   Abdominal pain secondary to perinephric fluid collection Left-sided hydronephrosis/urinoma/abscess status post drain placement 11/5 -S/P Left hydronephrosis s/p percutaneous nephrostomy tube placement 11/5 by Dr. Anselm Pancoast. Urinoma/abscess s/p drain placement by Dr. Anselm Pancoast 11/5 .-Final culture shows multiple resistance  -Continue antibiotics-IV Rocephin; will follow urology recs  -Appreciate urology input.  - IR input appreciated; Repeat CT of the abdomen pelvis on Monday/Tuesday as per IR - May need a left ureteroscopy/laser lithotripsy, ureteral stent placement per Urology. Pt likely to undergo IR guided procedure today. NPO now   -Resume Xarelto when cleared.  IV heparin in the meantime -Reimaging few days to reassess the collection  L4 compression fracture, acute/subacute -Denies any recent fall or trauma.  Pain control if it becomes necessary.  Bowel regimen.  No neurologic compromise at this time -Supportive care.  Follow-up outpatient.  Ileus, improved  Chronic atrial fibrillation -Xarelto  held prior to the procedure. Cont heparin gtt for now -  If no further procedure plans by urology, will resume Xarelto. -Continue amiodarone -Once normal procedure plan, resume Xarelto  Left renal cyst -Outpatient nonemergent MRI  History of COPD -Not in active exacerbation.  Resume bronchodilators.  BiPAP at bedtime  Chronic sacral pressure ulcer, stage IV, present on admission -Wound care team following.  Chronic neuropathy, peripheral -Gabapentin 400 mg 3 times daily  Hypotension: On 03/26/19, SBP in 80's per nurse. Pt had no complaints and was resting comfortably.  - BP stable range now  - Pt received his Midodrine dose as scheduled  - Cont to monitor  - Cont Midodrine as scheduled   Spoke with patient's daughter and updated her on patient's current status and plan  DVT prophylaxis: Heparin drip for now.  Eventually resume Xarelto Code Status: Partial code Family Communication: None Disposition Plan: Maintain hospital stay to manage his nephrostomy tube and the drain.  Repeat CT on Monday or Tuesday.  Consultants:   IR  Urology  Procedures:   Nephrostomy placed 11/5  Antimicrobials:   IV Rocephin   Subjective: No acute issues.  Denies any complaints. JB bag and drainage bags have serous liquid. No fever. Awaiting IR guided procedure/ drainage of the abscess today. No fever.   Review of Systems Otherwise negative except as per HPI, including: General: Denies fever, chills, night sweats or unintended weight loss. Resp: Denies cough, wheezing, shortness of breath. Cardiac: Denies chest pain, palpitations, orthopnea, paroxysmal nocturnal dyspnea. GI: Denies abdominal pain, nausea, vomiting, diarrhea or constipation GU: Denies dysuria, frequency, hesitancy or incontinence MS: Denies muscle aches, joint pain or swelling Neuro: Denies headache, neurologic deficits (focal weakness, numbness, tingling), abnormal gait Psych: Denies anxiety, depression,  SI/HI/AVH Skin: Denies new rashes or lesions ID: Denies sick contacts, exotic exposures, travel  Objective: Vitals:   03/28/19 0543 03/28/19 0735 03/28/19 0851 03/28/19 1136  BP: (!) 109/55  (!) 110/45 (!) 110/47  Pulse: 65  68 65  Resp: 20  20 (!) 22  Temp: 98.7 F (37.1 C)  98.1 F (36.7 C) 98.4 F (36.9 C)  TempSrc: Oral  Oral Oral  SpO2: 90% 90% 93% 94%  Weight:      Height:        Intake/Output Summary (Last 24 hours) at 03/28/2019 1405 Last data filed at 03/28/2019 0900 Gross per 24 hour  Intake 1099 ml  Output 3020 ml  Net -1921 ml   Filed Weights   03/26/19 0508 03/27/19 0419 03/28/19 0500  Weight: 95.3 kg 96.3 kg 98.1 kg    Examination:  General exam: Not in acute distress.  Appears chronically ill. Respiratory system: Clear to auscultation bilaterally Cardiovascular system: Normal sinus rhythm Gastrointestinal system: Abdomen is very minimally distended but has positive bowel sounds. GU: Has a left nephrostomy tube and drain bag which has my minimal to moderate urine collected.  No obvious purulent materials or bleeding.  Tube insertion site appears to be dry and clean. Central nervous system: Alert and oriented. No focal neurological deficits. Extremities: Symmetric 4 x 5 power.  No edema Skin: No rashes Psychiatry: Judgement and insight appear normal. Mood & affect appropriate.   Left-sided nephrostomy tube Chronic stage IV sacral ulcer External urinary catheter  Data Reviewed:   CBC: Recent Labs  Lab 06/06/18 1749 03/22/19 0450 03/24/19 0349 03/25/19 0639 03/26/19 0046 03/27/19 0405 03/28/19 0336  WBC 10.6* 11.4* 7.6 7.8 8.6 8.1 7.9  NEUTROABS 7.4 8.7*  --   --   --   --   --   HGB 10.9* 10.5* 9.7* 10.4* 9.5* 9.5* 9.8*  HCT 34.3* 33.3* 31.3* 34.3* 30.6* 31.1* 32.0*  MCV 99.1 100.0 100.6* 102.1* 102.3* 102.3* 102.2*  PLT 279 302 341 322 327 325 312   Basic Metabolic Panel: Recent Labs  Lab 03/22/19 0450 03/24/19 0349 03/26/19 0931  03/27/19 0405 03/28/19 0336  NA 141 143 137 139 140  K 3.4* 3.8 4.0 3.9 3.8  CL 97* 100 96* 96* 97*  CO2 37* 35* 34* 36* 36*  GLUCOSE 106* 93 137* 105* 103*  BUN 14 15 14 19 18   CREATININE 0.62 0.53* 0.62 0.56* 0.58*  CALCIUM 8.2* 8.4* 8.6* 9.0 8.9  MG 2.1  --   --   --   --    GFR: Estimated Creatinine Clearance: 73.5 mL/min (A) (by C-G formula based on SCr of 0.58 mg/dL (L)). Liver Function Tests: Recent Labs  Lab 06/06/18 1749  AST 19  ALT 15  ALKPHOS 64  BILITOT 0.4  PROT 5.6*  ALBUMIN 2.0*   Recent Labs  Lab 06/06/18 1749  LIPASE 20   No results for input(s): AMMONIA in the last 168 hours. Coagulation Profile: Recent Labs  Lab 03/22/19 0759  INR 1.1   Cardiac Enzymes: No results for input(s): CKTOTAL, CKMB, CKMBINDEX, TROPONINI in the last 168 hours. BNP (last 3 results) No results for input(s): PROBNP in the last 8760 hours. HbA1C: No results for input(s): HGBA1C in the last 72 hours. CBG: No results for input(s): GLUCAP in the last 168 hours. Lipid Profile: No results for input(s): CHOL, HDL, LDLCALC, TRIG, CHOLHDL, LDLDIRECT in the last 72 hours. Thyroid Function Tests: No results for input(s): TSH, T4TOTAL, FREET4, T3FREE, THYROIDAB in  the last 72 hours. Anemia Panel: No results for input(s): VITAMINB12, FOLATE, FERRITIN, TIBC, IRON, RETICCTPCT in the last 72 hours. Sepsis Labs: Recent Labs  Lab 03/27/19 1749  LATICACIDVEN 0.8    Recent Results (from the past 240 hour(s))  SARS CORONAVIRUS 2 (TAT 6-24 HRS) Nasopharyngeal Nasopharyngeal Swab     Status: None   Collection Time: 03-27-2019  5:54 PM   Specimen: Nasopharyngeal Swab  Result Value Ref Range Status   SARS Coronavirus 2 NEGATIVE NEGATIVE Final    Comment: (NOTE) SARS-CoV-2 target nucleic acids are NOT DETECTED. The SARS-CoV-2 RNA is generally detectable in upper and lower respiratory specimens during the acute phase of infection. Negative results do not preclude SARS-CoV-2 infection,  do not rule out co-infections with other pathogens, and should not be used as the sole basis for treatment or other patient management decisions. Negative results must be combined with clinical observations, patient history, and epidemiological information. The expected result is Negative. Fact Sheet for Patients: HairSlick.no Fact Sheet for Healthcare Providers: quierodirigir.com This test is not yet approved or cleared by the [place] FDA and  has been authorized for detection and/or diagnosis of SARS-CoV-2 by FDA under an Emergency Use Authorization (EUA). This EUA will remain  in effect (meaning this test can be used) for the duration of the COVID-19 declaration under Section 56 4(b)(1) of the Act, 21 U.S.C. section 360bbb-3(b)(1), unless the authorization is terminated or revoked sooner. Performed at [facility], [address] [place], [place] [address]   UCx     Status: Abnormal   Collection Time: 27-Mar-2019  7:43 PM   Specimen: Urine, Random  Result Value Ref Range Status   Specimen Description URINE, RANDOM  Final   Special Requests NONE  Final   Culture (A)  Final    <10,000 COLONIES/mL INSIGNIFICANT GROWTH Performed at [facility], [address], [place], [place] [address]    Report Status 03/22/2019 FINAL  Final  Urine culture     Status: Abnormal   Collection Time: 03/22/19 11:20 AM   Specimen: Kidney; Urine  Result Value Ref Range Status   Specimen Description KIDNEY LEFT  Final   Special Requests   Final    NONE Performed at [facility], [address] [place], [place] [address]    Culture 100 COLONIES/mL PROTEUS MIRABILIS (A)  Final   Report Status 03/25/2019 FINAL  Final   Organism ID, Bacteria PROTEUS MIRABILIS (A)  Final      Susceptibility   Proteus mirabilis - MIC*    AMPICILLIN >=32 RESISTANT Resistant     CEFAZOLIN 16 SENSITIVE Sensitive     CEFEPIME <=1 SENSITIVE  Sensitive     CEFTAZIDIME <=1 SENSITIVE Sensitive     CEFTRIAXONE <=1 SENSITIVE Sensitive     CIPROFLOXACIN >=4 RESISTANT Resistant     GENTAMICIN <=1 SENSITIVE Sensitive     IMIPENEM 2 SENSITIVE Sensitive     TRIMETH/SULFA >=320 RESISTANT Resistant     AMPICILLIN/SULBACTAM >=32 RESISTANT Resistant     PIP/TAZO <=4 SENSITIVE Sensitive     * 100 COLONIES/mL PROTEUS MIRABILIS  Aerobic/Anaerobic Culture (surgical/deep wound)     Status: None   Collection Time: 03/22/19  3:14 PM   Specimen: Abscess  Result Value Ref Range Status   Specimen Description ABSCESS  Final   Special Requests LEFT RETROPERITONEAL  Final   Gram Stain   Final    ABUNDANT WBC PRESENT,BOTH PMN AND MONONUCLEAR NO ORGANISMS SEEN  Culture   Final    RARE PROTEUS MIRABILIS NO ANAEROBES ISOLATED Performed at [facility], [address] [place], [place] [address]    Report Status 03/27/2019 FINAL  Final   Organism ID, Bacteria PROTEUS MIRABILIS  Final      Susceptibility   Proteus mirabilis - MIC*    AMPICILLIN >=32 RESISTANT Resistant     CEFAZOLIN 16 SENSITIVE Sensitive     CEFEPIME <=1 SENSITIVE Sensitive     CEFTAZIDIME <=1 SENSITIVE Sensitive     CEFTRIAXONE <=1 SENSITIVE Sensitive     CIPROFLOXACIN >=4 RESISTANT Resistant     GENTAMICIN <=1 SENSITIVE Sensitive     IMIPENEM 2 SENSITIVE Sensitive     TRIMETH/SULFA >=320 RESISTANT Resistant     AMPICILLIN/SULBACTAM >=32 RESISTANT Resistant     PIP/TAZO <=4 SENSITIVE Sensitive     * RARE PROTEUS MIRABILIS  MRSA PCR Screening     Status: None   Collection Time: 03/22/19  5:09 PM   Specimen: Nasopharyngeal  Result Value Ref Range Status   MRSA by PCR NEGATIVE NEGATIVE Final    Comment:        The GeneXpert MRSA Assay (FDA approved for NASAL specimens only), is one component of a comprehensive MRSA colonization surveillance program. It is not intended to diagnose MRSA infection nor to guide or monitor treatment for MRSA  infections. Performed at [facility], [address], [place], [place] [address]          Radiology Studies: Ct Abdomen Pelvis W Contrast  Result Date: 03/27/2019 CLINICAL DATA:  Status post left percutaneous nephrostomy tube placement and percutaneous catheter drainage of adjacent left retroperitoneal abscess on 03/22/2019. EXAM: CT ABDOMEN AND PELVIS WITH CONTRAST TECHNIQUE: Multidetector CT imaging of the abdomen and pelvis was performed using the standard protocol following bolus administration of intravenous contrast. CONTRAST:  OMNIPAQUE IOHEXOL 300 MG/ML  SOLN COMPARISON:  04/01/2019 FINDINGS: Lower chest: Bilateral lower lobe atelectasis with trace bilateral pleural effusions. Hepatobiliary: The liver is unremarkable. The gallbladder is relatively contracted and contains some dependent calculi. No biliary dilatation. Pancreas: Unremarkable. No pancreatic ductal dilatation or surrounding inflammatory changes. Spleen: Normal in size without focal abnormality. Adrenals/Urinary Tract: Nephrostomy tube extends into the left renal pelvis. The left renal collecting system is decompressed and there is no hydronephrosis. Additional medial retroperitoneal drain has resulted in some decompression of the retroperitoneal abscess medial and inferior to the left kidney. There is a non drained component of abscess inferiorly with fluid anterior to the aortic bifurcation measuring 5.5 x 3.1 x 5.4 cm and communicating pocket lateral to this area measuring approximately 2.8 x 2.1 x 3.8 cm. Adjacent edema extends inferiorly in the left pelvic retroperitoneal space. 4 cm high density lesion of the medial upper left kidney may represent a hemorrhagic cyst or complex cystic lesion. The right kidney remains normal in appearance. No adrenal masses. The bladder demonstrates mild wall thickening. Stomach/Bowel: Gastric distension with air-fluid level in the stomach. Moderate fecal material throughout much of  the colon with mild colonic ileus suspected. Small bowel is nondilated. No free air. Vascular/Lymphatic: Stable aortoiliac atherosclerosis. No enlarged lymph nodes. Reproductive: Prostate is unremarkable. Other: Stable right inguinal hernia containing fat. Musculoskeletal: Stable L3 compression fracture and diffuse lumbar degenerative disc disease. IMPRESSION: 1. Improvement in appearance of retroperitoneal infection after catheter drainage. There is a non drained component of abscess inferiorly with fluid anterior to the aortic bifurcation measuring 5.5 x 3.1 x 5.4 cm and communicating pocket  lateral to this area measuring 2.8 x 2.1 x 3.8 cm. 2. Left renal collecting system is decompressed by the percutaneous nephrostomy tube. 3. 4 cm high density lesion of the medial upper left kidney may represent a hemorrhagic cyst or complex cystic lesion. Follow-up warranted. 4. Bilateral lower lobe atelectasis with trace bilateral pleural effusions. 5. Gastric distension with air-fluid level in the stomach. 6. Moderate fecal material throughout much of the colon with mild colonic ileus suspected. 7. Stable right inguinal hernia containing fat. 8. Stable L3 compression fracture. Electronically Signed   By: Irish LackGlenn  Yamagata M.D.   On: 03/27/2019 16:21        Scheduled Meds:  amiodarone  200 mg Oral Daily   famotidine  20 mg Oral BID   feeding supplement (PRO-STAT SUGAR FREE 64)  30 mL Oral BID WC   fentaNYL       gabapentin  400 mg Oral TID   midazolam       midodrine  10 mg Oral TID WC   multivitamin with minerals  1 tablet Oral Daily   nutrition supplement (JUVEN)  1 packet Oral BID BM   potassium chloride  20 mEq Oral Daily   Ensure Max Protein  11 oz Oral QHS   sodium chloride flush  3 mL Intravenous Q12H   sodium chloride flush  3 mL Intravenous Q12H   sodium chloride flush  5 mL Intracatheter Q8H   umeclidinium bromide  1 puff Inhalation Daily   Continuous Infusions:  sodium  chloride Stopped (03/27/19 1803)   cefTRIAXone (ROCEPHIN)  IV 2 g (03/27/19 1726)   heparin Stopped (03/28/19 1346)     LOS: 6 days   Time spent= 25 mins    [name] Bottomsawfikul Jaqualyn Juday, MD [facility]  If 7PM-7AM, please contact night-coverage  03/28/2019, 2:05 PM

## 2019-03-28 NOTE — Progress Notes (Signed)
ANTICOAGULATION CONSULT NOTE  Pharmacy Consult:  Heparin Indication: atrial fibrillation  Allergies  Allergen Reactions  . Nsaids Other (See Comments)    Stomach pain  . Morphine And Related Nausea And Vomiting and Other (See Comments)    Dizziness, light-headedness and "Opiates cause tightness in chest"  . Vicodin [Hydrocodone-Acetaminophen] Anxiety    Patient Measurements: Height: 5\' 10"  (177.8 cm) Weight: 216 lb 4.3 oz (98.1 kg) IBW/kg (Calculated) : 73 Heparin Dosing Weight: 92.3kg   Vital Signs: Temp: 98.4 F (36.9 C) (11/11 1136) Temp Source: Oral (11/11 1136) BP: 117/58 (11/11 1510) Pulse Rate: 67 (11/11 1510)  Labs: Recent Labs    03/26/19 0046 03/26/19 0931 03/27/19 0405 03/28/19 0336  HGB 9.5*  --  9.5* 9.8*  HCT 30.6*  --  31.1* 32.0*  PLT 327  --  325 312  HEPARINUNFRC 0.42 0.45 0.49 0.55  CREATININE  --  0.62 0.56* 0.58*    Estimated Creatinine Clearance: 73.5 mL/min (A) (by C-G formula based on SCr of 0.58 mg/dL (L)).   Assessment: 52 yoM admitted 11/4 with perinephric fluid collection requiring nephrostomy tube. Pt on Xarelto PTA for hx AFib (CHADSVASc = 3) which has been held with need for procedures and bridged with IV heparin. Last dose of Xarelto was 11/4 am.  Patient is s/p catheter placement for abscess drainage.  Spoke to IR and attending, okay to restart IV heparin now.  Goal of Therapy:  Heparin level 0.3-0.7 units/ml Monitor platelets by anticoagulation protocol: Yes   Plan:  Restart heparin gtt at 1950 units/hr F/U heparin level in AM since they have been therapeutic and stable  Wende Longstreth D. Mina Marble, PharmD, BCPS, [place] 03/28/2019, 4:46 PM

## 2019-03-28 NOTE — Progress Notes (Signed)
Pt states he doesn't want to wear CPAP for the night. Pt in no distress

## 2019-03-28 NOTE — Progress Notes (Signed)
Referring Physician(s): Dr.Opyd  Supervising Physician: Simonne ComeWatts, John  Patient Status:  [name] - In-pt  Chief Complaint:  Left hydronephrosis Urinoma  Subjective:  [name] is doing well this morning. No complaints.   Allergies: Nsaids, Morphine and related, and Vicodin [hydrocodone-acetaminophen]  Medications: Prior to Admission medications   Medication Sig Start Date End Date Taking? Authorizing Provider  acetaminophen (TYLENOL) 500 MG tablet Take 2 tablets (1,000 mg total) by mouth every 8 (eight) hours. Patient taking differently: Take 1,000 mg by mouth See admin instructions. Take 1,000 mg by mouth every eight hours and do not exceed 4,000 mg/24 hours from all combined sources 01/24/17  Yes Babish, Molli HazardMatthew, [name]  albuterol (PROVENTIL HFA;VENTOLIN HFA) 108 (90 Base) MCG/ACT inhaler Inhale 2 puffs into the lungs every 4 (four) hours as needed for wheezing or shortness of breath.    Yes [provider]  Amino Acids-Protein Hydrolys (FEEDING SUPPLEMENT, PRO-STAT SUGAR FREE 64,) LIQD Take 30 mLs by mouth 3 (three) times daily.    Yes [provider]  amiodarone (PACERONE) 200 MG tablet TAKE ONE TABLET BY MOUTH ONCE DAILY Patient taking differently: Take 200 mg by mouth daily.  10/29/16  Yes Hilty, Lisette AbuKenneth C, MD  Artificial Saliva (BIOTENE DRY MOUTH MOISTURIZING) SOLN 1 spray by Mouth Rinse route every 2 (two) hours as needed (for dryness).   Yes [provider]  bumetanide (BUMEX) 1 MG tablet Take 1 tablet (1 mg total) by mouth 2 (two) times daily. 10/15/16  Yes Hilty, Lisette AbuKenneth C, MD  cetirizine (ZYRTEC) 10 MG tablet Take 10 mg by mouth daily.    Yes [provider]  Cholecalciferol (VITAMIN D-3) 25 MCG (1000 UT) CAPS Take 1,000 Units by mouth daily.   Yes [provider]  docusate sodium 100 MG CAPS Take 100 mg by mouth 2 (two) times daily. 08/15/13  Yes Babish, Molli HazardMatthew, [name]  famotidine (PEPCID) 20 MG tablet Take 1 tablet (20 mg total) by mouth  2 (two) times daily. 02/21/17  Yes Tyrone NineGrunz, Ryan B, MD  ferrous sulfate (FERROUSUL) 325 (65 FE) MG tablet Take 1 tablet (325 mg total) by mouth 3 (three) times daily with meals. Patient taking differently: Take 325 mg by mouth 2 (two) times daily with a meal.  01/24/17  Yes Babish, Molli HazardMatthew, [name]  gabapentin (NEURONTIN) 400 MG capsule TAKE 1 CAPSULE BY MOUTH THREE TIMES DAILY Patient taking differently: Take 400 mg by mouth 3 (three) times daily.  01/14/17  Yes Gordy SaversKwiatkowski, Peter F, MD  midodrine (PROAMATINE) 10 MG tablet Take 1 tablet (10 mg total) by mouth 3 (three) times daily. 05/28/17  Yes Tyrone NineGrunz, Ryan B, MD  Multiple Vitamins-Minerals (CERTAGEN PO) Take 1 tablet by mouth daily.    Yes [provider]  Multiple Vitamins-Minerals (PRESERVISION AREDS 2) CAPS Take 1 capsule by mouth 2 (two) times daily.   Yes [provider]  nutrition supplement, JUVEN, (JUVEN) PACK Take 1 packet by mouth 2 (two) times daily between meals. 02/21/17  Yes Tyrone NineGrunz, Ryan B, MD  Nutritional Supplements (RESOURCE 2.0) LIQD Take 120 mLs by mouth at bedtime.   Yes [provider]  OXYGEN Inhale 2 L/min into the lungs as needed (for shortness of breath).    Yes [provider]  polyethylene glycol (MIRALAX / GLYCOLAX) packet Take 17 g by mouth 2 (two) times daily. Patient taking differently: Take 17 g by mouth See admin instructions. Mix 17 grams into 4-8 ounces of liquid and drink two times a day 01/24/17  Yes Babish, Molli Hazard, [name]  potassium chloride (KLOR-CON) 20 MEQ packet Take 20 mEq by mouth daily.   Yes [provider]  PRESCRIPTION MEDICATION BiPAP: At bedtime   Yes [provider]  rivaroxaban (XARELTO) 20 MG TABS tablet Take 1 tablet (20 mg total) by mouth daily. 08/05/15  Yes Hilty, Lisette Abu, MD  rosuvastatin (CRESTOR) 20 MG tablet TAKE ONE TABLET BY MOUTH ONCE A WEEK, FRIDAY Patient taking differently: Take 20 mg by mouth every Friday.  12/24/16  Yes Hilty, Lisette Abu,  MD  simethicone (MYLICON) 125 MG chewable tablet Chew 125 mg by mouth 3 (three) times daily. FOR 5 DAYS 03/25/19 03/26/19 Yes [provider]  SPIRIVA HANDIHALER 18 MCG inhalation capsule INHALE ONE PUFF BY MOUTH ONCE DAILY Patient taking differently: Place 18 mcg into inhaler and inhale daily.  03/18/16  Yes [name], MD  tamsulosin (FLOMAX) 0.4 MG CAPS capsule TAKE 1 CAPSULE BY MOUTH ONCE DAILY Patient taking differently: Take 0.4 mg by mouth daily.  12/24/16  Yes [name], MD  traMADol (ULTRAM) 50 MG tablet Take 1-2 tablets (50-100 mg total) by mouth every 12 (twelve) hours as needed for moderate pain or severe pain (and before hydrotherapy). Patient taking differently: Take 50 mg by mouth at bedtime.  05/28/17  Yes Tyrone Nine, MD  Magnesium 250 MG TABS Take 250 mg by mouth daily.     [provider]  Melatonin 3 MG CAPS Take 3 mg by mouth as needed (insomnia).    [provider]  Multiple Vitamin (MULTIVITAMIN WITH MINERALS) TABS tablet Take 1 tablet by mouth daily.    [provider]  protein supplement shake (PREMIER PROTEIN) LIQD Take 325 mLs (11 oz total) by mouth daily. Patient not taking: Reported on 03/25/19 02/21/17   Tyrone Nine, MD     Vital Signs: BP (!) 110/45 (BP Location: Left Arm)    Pulse 68    Temp 98.1 F (36.7 C) (Oral)    Resp 20    Ht 5\' 10"  (1.778 m)    Wt 98.1 kg    SpO2 93%    BMI 31.03 kg/m   Physical Exam Awake and alert Left PCN in place with clear urine Urinoma drain output is clearing, appears more clear yellow today. ~40 mL output CT done yesterday shows = There is a non drained component of abscess inferiorly with fluid anterior to the aortic bifurcation measuring 5.5 x 3.1 x 5.4 cm and communicating pocket lateral to this area measuring 2.8 x 2.1 x 3.8 cm.  Imaging: Ct Abdomen Pelvis W Contrast  Result Date: 03/27/2019 CLINICAL DATA:  Status post left percutaneous nephrostomy tube  placement and percutaneous catheter drainage of adjacent left retroperitoneal abscess on 03/22/2019. EXAM: CT ABDOMEN AND PELVIS WITH CONTRAST TECHNIQUE: Multidetector CT imaging of the abdomen and pelvis was performed using the standard protocol following bolus administration of intravenous contrast. CONTRAST:  OMNIPAQUE IOHEXOL 300 MG/ML  SOLN COMPARISON:  March 25, 2019 FINDINGS: Lower chest: Bilateral lower lobe atelectasis with trace bilateral pleural effusions. Hepatobiliary: The liver is unremarkable. The gallbladder is relatively contracted and contains some dependent calculi. No biliary dilatation. Pancreas: Unremarkable. No pancreatic ductal dilatation or surrounding inflammatory changes. Spleen: Normal in size without focal abnormality. Adrenals/Urinary Tract: Nephrostomy tube extends into the left renal pelvis. The left renal collecting system is decompressed and there is no hydronephrosis. Additional medial retroperitoneal drain has resulted in some decompression of the retroperitoneal abscess medial and inferior to the  left kidney. There is a non drained component of abscess inferiorly with fluid anterior to the aortic bifurcation measuring 5.5 x 3.1 x 5.4 cm and communicating pocket lateral to this area measuring approximately 2.8 x 2.1 x 3.8 cm. Adjacent edema extends inferiorly in the left pelvic retroperitoneal space. 4 cm high density lesion of the medial upper left kidney may represent a hemorrhagic cyst or complex cystic lesion. The right kidney remains normal in appearance. No adrenal masses. The bladder demonstrates mild wall thickening. Stomach/Bowel: Gastric distension with air-fluid level in the stomach. Moderate fecal material throughout much of the colon with mild colonic ileus suspected. Small bowel is nondilated. No free air. Vascular/Lymphatic: Stable aortoiliac atherosclerosis. No enlarged lymph nodes. Reproductive: Prostate is unremarkable. Other: Stable right inguinal hernia  containing fat. Musculoskeletal: Stable L3 compression fracture and diffuse lumbar degenerative disc disease. IMPRESSION: 1. Improvement in appearance of retroperitoneal infection after catheter drainage. There is a non drained component of abscess inferiorly with fluid anterior to the aortic bifurcation measuring 5.5 x 3.1 x 5.4 cm and communicating pocket lateral to this area measuring 2.8 x 2.1 x 3.8 cm. 2. Left renal collecting system is decompressed by the percutaneous nephrostomy tube. 3. 4 cm high density lesion of the medial upper left kidney may represent a hemorrhagic cyst or complex cystic lesion. Follow-up warranted. 4. Bilateral lower lobe atelectasis with trace bilateral pleural effusions. 5. Gastric distension with air-fluid level in the stomach. 6. Moderate fecal material throughout much of the colon with mild colonic ileus suspected. 7. Stable right inguinal hernia containing fat. 8. Stable L3 compression fracture. Electronically Signed   By: Irish Lack M.D.   On: 03/27/2019 16:21    Labs:  CBC: Recent Labs    03/25/19 0639 03/26/19 0046 03/27/19 0405 03/28/19 0336  WBC 7.8 8.6 8.1 7.9  HGB 10.4* 9.5* 9.5* 9.8*  HCT 34.3* 30.6* 31.1* 32.0*  PLT 322 327 325 312    COAGS: Recent Labs    03/22/19 0759 03/24/19 1241  INR 1.1  --   APTT  --  36    BMP: Recent Labs    03/24/19 0349 03/26/19 0931 03/27/19 0405 03/28/19 0336  NA 143 137 139 140  K 3.8 4.0 3.9 3.8  CL 100 96* 96* 97*  CO2 35* 34* 36* 36*  GLUCOSE 93 137* 105* 103*  BUN 15 14 19 18   CALCIUM 8.4* 8.6* 9.0 8.9  CREATININE 0.53* 0.62 0.56* 0.58*  GFRNONAA >60 >60 >60 >60  GFRAA >60 >60 >60 >60    LIVER FUNCTION TESTS: Recent Labs    Apr 15, 2019 1749  BILITOT 0.4  AST 19  ALT 15  ALKPHOS 64  PROT 5.6*  ALBUMIN 2.0*    Assessment and Plan:  S/P Left hydronephrosis s/p percutaneous nephrostomy tube placement 11/5 by Dr. 13/5 s/p drain placement by Dr. Phillips Odor 11/5.  There  is a non drained component of abscess inferiorly with fluid anterior to the aortic bifurcation measuring 5.5 x 3.1 x 5.4 cm and communicating pocket lateral to this area measuring 2.8 x 2.1 x 3.8 cm.  Will proceed with additional drain today by Dr. 13/5.  Risks and benefits discussed with the patient including bleeding, infection, damage to adjacent structures, bowel perforation/fistula connection, and sepsis.  All of the patient's questions were answered, patient is agreeable to proceed. Consent signed and in chart.  Electronically Signed: Grace Isaac, [name] 03/28/2019, 9:38 AM    I spent a total of 15 Minutes at the  the patient's bedside AND on the patient's hospital floor or unit, greater than 50% of which was counseling/coordinating care for f/u drains and placement of a new drain left urinoma.

## 2019-03-29 LAB — CBC
HCT: 29.7 % — ABNORMAL LOW (ref 39.0–52.0)
Hemoglobin: 9.1 g/dL — ABNORMAL LOW (ref 13.0–17.0)
MCH: 31.4 pg (ref 26.0–34.0)
MCHC: 30.6 g/dL (ref 30.0–36.0)
MCV: 102.4 fL — ABNORMAL HIGH (ref 80.0–100.0)
Platelets: 330 10*3/uL (ref 150–400)
RBC: 2.9 MIL/uL — ABNORMAL LOW (ref 4.22–5.81)
RDW: 14.3 % (ref 11.5–15.5)
WBC: 6.8 10*3/uL (ref 4.0–10.5)
nRBC: 0 % (ref 0.0–0.2)

## 2019-03-29 LAB — URINALYSIS, ROUTINE W REFLEX MICROSCOPIC
Bilirubin Urine: NEGATIVE
Glucose, UA: NEGATIVE mg/dL
Ketones, ur: NEGATIVE mg/dL
Nitrite: NEGATIVE
Protein, ur: 100 mg/dL — AB
RBC / HPF: >50 RBC/hpf — ABNORMAL HIGH (ref 0–5)
Specific Gravity, Urine: 1.017 (ref 1.005–1.030)
pH: 8 (ref 5.0–8.0)

## 2019-03-29 LAB — BASIC METABOLIC PANEL
Anion gap: 9 (ref 5–15)
BUN: 12 mg/dL (ref 8–23)
CO2: 34 mmol/L — ABNORMAL HIGH (ref 22–32)
Calcium: 8.9 mg/dL (ref 8.9–10.3)
Chloride: 98 mmol/L (ref 98–111)
Creatinine, Ser: 0.54 mg/dL — ABNORMAL LOW (ref 0.61–1.24)
GFR calc Af Amer: >60 mL/min (ref >60)
GFR calc non Af Amer: >60 mL/min (ref >60)
Glucose, Bld: 118 mg/dL — ABNORMAL HIGH (ref 70–99)
Potassium: 3.8 mmol/L (ref 3.5–5.1)
Sodium: 141 mmol/L (ref 135–145)

## 2019-03-29 LAB — HEPARIN LEVEL (UNFRACTIONATED): Heparin Unfractionated: 0.42 IU/mL (ref 0.30–0.70)

## 2019-03-29 NOTE — Progress Notes (Signed)
PROGRESS NOTE    Cochise Dinneen Pett  XBJ:[id number] DOB: 04-Oct-1926 DOA: 03/19/2019 PCP: System, Pcp Not In   Brief Narrative:   85 y.o. male with medical history significant foratrial fibrillation on Xarelto, chronic diastolic CHF, asthma/COPD, OSA on BiPAP, hypotension on midodrine, and chronic sacral ulcer, now presenting to the emergency department for evaluation of progressive abdominal distention with alternating constipation and watery stool.  Found to have perinephric fluid collection requiring nephrostomy tube.   Assessment & Plan:   Principal Problem:   Perinephric fluid collection Active Problems:   Asthma   Paroxysmal Atrial fibrillation - admitted with RVR   Chronic diastolic CHF (congestive heart failure) (HCC)   AF (paroxysmal atrial fibrillation) (HCC)   Hypokalemia   Ileus (HCC)   Closed compression fracture of L4 vertebra (HCC)   Sacral pressure ulcer   Renal cyst, left   Abdominal pain secondary to perinephric fluid collection Left-sided hydronephrosis/urinoma/abscess status post drain placement 11/5 -S/P Left hydronephrosis s/p percutaneous nephrostomy tube placement 11/5 by Dr. Lowella Dandy. Urinoma/abscess s/p drain placement by Dr. Lowella Dandy 11/5 and new drain by Dr. Grace Isaac on 03/28/19 -Final culture shows multiple resistance  -Continue antibiotics-IV Rocephin; will follow urology recs  -Appreciate urology input.  - IR input appreciated  - May need a left ureteroscopy/laser lithotripsy, ureteral stent placement per Urology.  - IV heparin in the meantime -Reimaging few days to reassess the collection  L4 compression fracture, acute/subacute -Denies any recent fall or trauma.  Pain control if it becomes necessary.  Bowel regimen.  No neurologic compromise at this time -Supportive care.  Follow-up outpatient.  Ileus, improved  Chronic atrial fibrillation -Xarelto held prior to the procedure. Cont heparin gtt for now -  If no further procedure plans by urology, will  resume Xarelto. -Continue amiodarone -Once normal procedure plan, resume Xarelto  Left renal cyst -Outpatient nonemergent MRI  History of COPD -Not in active exacerbation.  Resume bronchodilators.  BiPAP at bedtime  Chronic sacral pressure ulcer, stage IV, present on admission -Wound care team following.  Chronic neuropathy, peripheral -Gabapentin 400 mg 3 times daily  Hypotension: On 03/26/19, SBP in 80's per nurse. Pt had no complaints and was resting comfortably.  - BP stable range now  - Pt received his Midodrine dose as scheduled  - Cont to monitor  - Cont Midodrine as scheduled   Spoke with patient's daughter and updated her on patient's current status and plan  DVT prophylaxis: Heparin drip for now.  Eventually resume Xarelto Code Status: Partial code Family Communication: None Disposition Plan: Maintain hospital stay to manage his nephrostomy tube and the drain.  Repeat CT on Monday or Tuesday.  Consultants:   IR  Urology  Procedures:   Nephrostomy placed 11/5  Antimicrobials:   IV Rocephin   Subjective: S/P new CT-guided drainage catheter placement on 03/28/2019 by IR.   - No acute issues.  Denies any complaints. JP bag and drainage bags have serous liquid. No fever.    Review of Systems Otherwise negative except as per HPI, including: General: Denies fever, chills, night sweats or unintended weight loss. Resp: Denies cough, wheezing, shortness of breath. Cardiac: Denies chest pain, palpitations, orthopnea, paroxysmal nocturnal dyspnea. GI: Denies abdominal pain, nausea, vomiting, diarrhea or constipation GU: Denies dysuria, frequency, hesitancy or incontinence MS: Denies muscle aches, joint pain or swelling Neuro: Denies headache, neurologic deficits (focal weakness, numbness, tingling), abnormal gait Psych: Denies anxiety, depression, SI/HI/AVH Skin: Denies new rashes or lesions ID: Denies sick contacts, exotic exposures,  travel  Objective: Vitals:   03/28/19 2110 03/29/19 0447 03/29/19 0658 03/29/19 0930  BP: (!) 123/106 (!) 90/49  (!) 106/51  Pulse: 60 60  60  Resp: (!) 22 20    Temp: 98.7 F (37.1 C) (!) 97.5 F (36.4 C)  97.9 F (36.6 C)  TempSrc: Oral Oral  Oral  SpO2: 94% 95%  93%  Weight:   95.8 kg   Height:        Intake/Output Summary (Last 24 hours) at 03/29/2019 1103 Last data filed at 03/29/2019 0911 Gross per 24 hour  Intake 1135.2 ml  Output 1218 ml  Net -82.8 ml   Filed Weights   03/27/19 0419 03/28/19 0500 03/29/19 0658  Weight: 96.3 kg 98.1 kg 95.8 kg    Examination:  General exam: Not in acute distress.  Appears chronically ill. Respiratory system: Clear to auscultation bilaterally Cardiovascular system: Normal sinus rhythm Gastrointestinal system: Abdomen is very minimally distended but has positive bowel sounds. GU: Has a left nephrostomy tube and drain bag X2 which has my minimal to moderate urine collected.  No obvious purulent materials or bleeding.  Tube insertion site appears to be dry and clean. Central nervous system: Alert and oriented. No focal neurological deficits. Extremities: Symmetric 4 x 5 power.  No edema Skin: No rashes Psychiatry: Judgement and insight appear normal. Mood & affect appropriate.   Left-sided nephrostomy tube Chronic stage IV sacral ulcer External urinary catheter  Data Reviewed:   CBC: Recent Labs  Lab 03/25/19 0639 03/26/19 0046 03/27/19 0405 03/28/19 0336 03/29/19 0500  WBC 7.8 8.6 8.1 7.9 6.8  HGB 10.4* 9.5* 9.5* 9.8* 9.1*  HCT 34.3* 30.6* 31.1* 32.0* 29.7*  MCV 102.1* 102.3* 102.3* 102.2* 102.4*  PLT 322 327 325 312 330   Basic Metabolic Panel: Recent Labs  Lab 03/24/19 0349 03/26/19 0931 03/27/19 0405 03/28/19 0336  NA 143 137 139 140  K 3.8 4.0 3.9 3.8  CL 100 96* 96* 97*  CO2 35* 34* 36* 36*  GLUCOSE 93 137* 105* 103*  BUN 15 14 19 18   CREATININE 0.53* 0.62 0.56* 0.58*  CALCIUM 8.4* 8.6* 9.0 8.9    GFR: Estimated Creatinine Clearance: 72.7 mL/min (A) (by C-G formula based on SCr of 0.58 mg/dL (L)). Liver Function Tests: No results for input(s): AST, ALT, ALKPHOS, BILITOT, PROT, ALBUMIN in the last 168 hours. No results for input(s): LIPASE, AMYLASE in the last 168 hours. No results for input(s): AMMONIA in the last 168 hours. Coagulation Profile: No results for input(s): INR, PROTIME in the last 168 hours. Cardiac Enzymes: No results for input(s): CKTOTAL, CKMB, CKMBINDEX, TROPONINI in the last 168 hours. BNP (last 3 results) No results for input(s): PROBNP in the last 8760 hours. HbA1C: No results for input(s): HGBA1C in the last 72 hours. CBG: No results for input(s): GLUCAP in the last 168 hours. Lipid Profile: No results for input(s): CHOL, HDL, LDLCALC, TRIG, CHOLHDL, LDLDIRECT in the last 72 hours. Thyroid Function Tests: No results for input(s): TSH, T4TOTAL, FREET4, T3FREE, THYROIDAB in the last 72 hours. Anemia Panel: No results for input(s): VITAMINB12, FOLATE, FERRITIN, TIBC, IRON, RETICCTPCT in the last 72 hours. Sepsis Labs: No results for input(s): PROCALCITON, LATICACIDVEN in the last 168 hours.  Recent Results (from the past 240 hour(s))  SARS CORONAVIRUS 2 (TAT 6-24 HRS) Nasopharyngeal Nasopharyngeal Swab     Status: None   Collection Time: 04/12/2019  5:54 PM   Specimen: Nasopharyngeal Swab  Result Value Ref Range Status  SARS Coronavirus 2 NEGATIVE NEGATIVE Final    Comment: (NOTE) SARS-CoV-2 target nucleic acids are NOT DETECTED. The SARS-CoV-2 RNA is generally detectable in upper and lower respiratory specimens during the acute phase of infection. Negative results do not preclude SARS-CoV-2 infection, do not rule out co-infections with other pathogens, and should not be used as the sole basis for treatment or other patient management decisions. Negative results must be combined with clinical observations, patient history, and epidemiological  information. The expected result is Negative. Fact Sheet for Patients: HairSlick.no Fact Sheet for Healthcare Providers: quierodirigir.com This test is not yet approved or cleared by the [place] FDA and  has been authorized for detection and/or diagnosis of SARS-CoV-2 by FDA under an Emergency Use Authorization (EUA). This EUA will remain  in effect (meaning this test can be used) for the duration of the COVID-19 declaration under Section 56 4(b)(1) of the Act, 21 U.S.C. section 360bbb-3(b)(1), unless the authorization is terminated or revoked sooner. Performed at [facility], [address], [place], [place] [address]   UCx     Status: Abnormal   Collection Time: 2019/03/25  7:43 PM   Specimen: Urine, Random  Result Value Ref Range Status   Specimen Description URINE, RANDOM  Final   Special Requests NONE  Final   Culture (A)  Final    <10,000 COLONIES/mL INSIGNIFICANT GROWTH Performed at [facility], [address] [place], [place] [address]    Report Status 03/22/2019 FINAL  Final  Urine culture     Status: Abnormal   Collection Time: 03/22/19 11:20 AM   Specimen: Kidney; Urine  Result Value Ref Range Status   Specimen Description KIDNEY LEFT  Final   Special Requests   Final    NONE Performed at [facility], [address] [place], [place] [address]    Culture 100 COLONIES/mL PROTEUS MIRABILIS (A)  Final   Report Status 03/25/2019 FINAL  Final   Organism ID, Bacteria PROTEUS MIRABILIS (A)  Final      Susceptibility   Proteus mirabilis - MIC*    AMPICILLIN >=32 RESISTANT Resistant     CEFAZOLIN 16 SENSITIVE Sensitive     CEFEPIME <=1 SENSITIVE Sensitive     CEFTAZIDIME <=1 SENSITIVE Sensitive     CEFTRIAXONE <=1 SENSITIVE Sensitive     CIPROFLOXACIN >=4 RESISTANT Resistant     GENTAMICIN <=1 SENSITIVE Sensitive     IMIPENEM 2 SENSITIVE Sensitive     TRIMETH/SULFA >=320 RESISTANT  Resistant     AMPICILLIN/SULBACTAM >=32 RESISTANT Resistant     PIP/TAZO <=4 SENSITIVE Sensitive     * 100 COLONIES/mL PROTEUS MIRABILIS  Aerobic/Anaerobic Culture (surgical/deep wound)     Status: None   Collection Time: 03/22/19  3:14 PM   Specimen: Abscess  Result Value Ref Range Status   Specimen Description ABSCESS  Final   Special Requests LEFT RETROPERITONEAL  Final   Gram Stain   Final    ABUNDANT WBC PRESENT,BOTH PMN AND MONONUCLEAR NO ORGANISMS SEEN    Culture   Final    RARE PROTEUS MIRABILIS NO ANAEROBES ISOLATED Performed at [facility], [address] [place], [place] [address]    Report Status 03/27/2019 FINAL  Final   Organism ID, Bacteria PROTEUS MIRABILIS  Final      Susceptibility   Proteus mirabilis - MIC*    AMPICILLIN >=32 RESISTANT Resistant     CEFAZOLIN 16 SENSITIVE Sensitive     CEFEPIME <=1 SENSITIVE  Sensitive     CEFTAZIDIME <=1 SENSITIVE Sensitive     CEFTRIAXONE <=1 SENSITIVE Sensitive     CIPROFLOXACIN >=4 RESISTANT Resistant     GENTAMICIN <=1 SENSITIVE Sensitive     IMIPENEM 2 SENSITIVE Sensitive     TRIMETH/SULFA >=320 RESISTANT Resistant     AMPICILLIN/SULBACTAM >=32 RESISTANT Resistant     PIP/TAZO <=4 SENSITIVE Sensitive     * RARE PROTEUS MIRABILIS  MRSA PCR Screening     Status: None   Collection Time: 03/22/19  5:09 PM   Specimen: Nasopharyngeal  Result Value Ref Range Status   MRSA by PCR NEGATIVE NEGATIVE Final    Comment:        The GeneXpert MRSA Assay (FDA approved for NASAL specimens only), is one component of a comprehensive MRSA colonization surveillance program. It is not intended to diagnose MRSA infection nor to guide or monitor treatment for MRSA infections. Performed at [facility], [place] [address] [place], [place] [address]   Aerobic/Anaerobic Culture (surgical/deep wound)     Status: None (Preliminary result)   Collection Time: 03/28/19  4:19 PM   Specimen: Abscess  Result Value Ref Range  Status   Specimen Description ABSCESS PERINEPHRIC DRAIN  Final   Special Requests Normal  Final   Gram Stain   Final    RARE WBC PRESENT,BOTH PMN AND MONONUCLEAR NO ORGANISMS SEEN    Culture   Final    NO GROWTH < 24 HOURS Performed at [facility], [place] [address], [place], [place] [address]    Report Status PENDING  Incomplete         Radiology Studies: Ct Abdomen Pelvis W Contrast  Result Date: 03/27/2019 CLINICAL DATA:  Status post left percutaneous nephrostomy tube placement and percutaneous catheter drainage of adjacent left retroperitoneal abscess on 03/22/2019. EXAM: CT ABDOMEN AND PELVIS WITH CONTRAST TECHNIQUE: Multidetector CT imaging of the abdomen and pelvis was performed using the standard protocol following bolus administration of intravenous contrast. CONTRAST:  153mL OMNIPAQUE IOHEXOL 300 MG/ML  SOLN COMPARISON:  04/01/2019 FINDINGS: Lower chest: Bilateral lower lobe atelectasis with trace bilateral pleural effusions. Hepatobiliary: The liver is unremarkable. The gallbladder is relatively contracted and contains some dependent calculi. No biliary dilatation. Pancreas: Unremarkable. No pancreatic ductal dilatation or surrounding inflammatory changes. Spleen: Normal in size without focal abnormality. Adrenals/Urinary Tract: Nephrostomy tube extends into the left renal pelvis. The left renal collecting system is decompressed and there is no hydronephrosis. Additional medial retroperitoneal drain has resulted in some decompression of the retroperitoneal abscess medial and inferior to the left kidney. There is a non drained component of abscess inferiorly with fluid anterior to the aortic bifurcation measuring 5.5 x 3.1 x 5.4 cm and communicating pocket lateral to this area measuring approximately 2.8 x 2.1 x 3.8 cm. Adjacent edema extends inferiorly in the left pelvic retroperitoneal space. 4 cm high density lesion of the medial upper left kidney may represent a hemorrhagic  cyst or complex cystic lesion. The right kidney remains normal in appearance. No adrenal masses. The bladder demonstrates mild wall thickening. Stomach/Bowel: Gastric distension with air-fluid level in the stomach. Moderate fecal material throughout much of the colon with mild colonic ileus suspected. Small bowel is nondilated. No free air. Vascular/Lymphatic: Stable aortoiliac atherosclerosis. No enlarged lymph nodes. Reproductive: Prostate is unremarkable. Other: Stable right inguinal hernia containing fat. Musculoskeletal: Stable L3 compression fracture and diffuse lumbar degenerative disc disease. IMPRESSION: 1. Improvement in appearance of retroperitoneal infection after catheter drainage. There is  a non drained component of abscess inferiorly with fluid anterior to the aortic bifurcation measuring 5.5 x 3.1 x 5.4 cm and communicating pocket lateral to this area measuring 2.8 x 2.1 x 3.8 cm. 2. Left renal collecting system is decompressed by the percutaneous nephrostomy tube. 3. 4 cm high density lesion of the medial upper left kidney may represent a hemorrhagic cyst or complex cystic lesion. Follow-up warranted. 4. Bilateral lower lobe atelectasis with trace bilateral pleural effusions. 5. Gastric distension with air-fluid level in the stomach. 6. Moderate fecal material throughout much of the colon with mild colonic ileus suspected. 7. Stable right inguinal hernia containing fat. 8. Stable L3 compression fracture. Electronically Signed   By: Irish LackGlenn  Yamagata M.D.   On: 03/27/2019 16:21   Ct Image Guided Drainage By Percutaneous Catheter  Result Date: 03/28/2019 INDICATION: History of left-sided urinary obstruction complicated by development of a perinephric abscess, post percutaneous nephrostomy catheter placement as well as perinephric abscess drainage catheter placement on 03/22/2019. Unfortunately, postprocedural CT scan of the abdomen pelvis performed 03/27/2019 demonstrates a residual undrained fluid  collection about the inferior medial aspect of the left kidney for which the patient presents now for additional perinephric drainage catheter placement. EXAM: CT IMAGE GUIDED DRAINAGE BY PERCUTANEOUS CATHETER COMPARISON:  CT abdomen pelvis-27-Jan-2019; 03/27/2019; CT-guided percutaneous perinephric abscess drainage catheter placement-03/22/2019; image guided percutaneous nephrostomy catheter placement-03/22/2019 MEDICATIONS: The patient is currently admitted to the hospital and receiving intravenous antibiotics. The antibiotics were administered within an appropriate time frame prior to the initiation of the procedure. ANESTHESIA/SEDATION: Moderate (conscious) sedation was employed during this procedure. A total of Versed 1 mg and Fentanyl 12.5 mcg was administered intravenously. Moderate Sedation Time: 18 minutes. The patient's level of consciousness and vital signs were monitored continuously by radiology nursing throughout the procedure under my direct supervision. CONTRAST:  None COMPLICATIONS: None immediate. PROCEDURE: Informed written consent was obtained from the patient after a discussion of the risks, benefits and alternatives to treatment. The patient was placed supine, slightly RPO on the CT gantry and a pre procedural CT was performed re-demonstrating the known abscess/fluid collection about the inferior medial aspect of the left kidney with dominant medial component measuring approximately 5.7 x 3.8 cm and smaller lateral component measuring approximately 2.3 x 2.3 cm (both collections seen on image 59, series 2). The procedure was planned. A timeout was performed prior to the initiation of the procedure. The skin overlying the lateral aspect of the left lower abdomen/pelvis was prepped and draped in the usual sterile fashion. The overlying soft tissues were anesthetized with 1% lidocaine with epinephrine. Appropriate trajectory was planned with the use of a 22 gauge spinal needle. An 18 gauge trocar  needle was advanced sparing both abscess/fluid collections and a short Amplatz super stiff wire was coiled within the dominant more medially positioned collection. Appropriate positioning was confirmed with a limited CT scan. The tract was serially dilated allowing placement of a 10 [place] all-purpose drainage catheter. Appropriate positioning was confirmed with a limited postprocedural CT scan. Approximately 30 ml of purulent fluid was aspirated. The tube was connected to a JP bulb and sutured in place. A dressing was placed. The patient tolerated the procedure well without immediate post procedural complication. IMPRESSION: Successful CT guided placement of a 10 French all purpose drain catheter into the residual perinephric fluid collections adjacent to the inferior medial aspect the left kidney with aspiration of 30 mL of purulent fluid. Samples were sent to the laboratory as requested by the ordering  clinical team. PLAN: - Recommend flushing of both existing percutaneous drainage catheters with 10 cc of saline b.i.d. - Once drainage catheter output is less than 10 cc per day (excluding flush volumes) repeat (preferably IV contrast-enhanced) CT scan of the abdomen and pelvis could be performed as indicated. - Note, both drainage will likely require drainage catheter injections prior to consideration of removal. - Continued nephrostomy management as per the urologic service, though ultimately, patient may be a candidate for attempted fluoroscopic guided antegrade left-sided ureteral stent placement. Electronically Signed   By: Simonne ComeJohn  Watts M.D.   On: 03/28/2019 16:04        Scheduled Meds:  amiodarone  200 mg Oral Daily   famotidine  20 mg Oral BID   feeding supplement (PRO-STAT SUGAR FREE 64)  30 mL Oral BID WC   gabapentin  400 mg Oral TID   midodrine  10 mg Oral TID WC   multivitamin with minerals  1 tablet Oral Daily   nutrition supplement (JUVEN)  1 packet Oral BID BM   potassium  chloride  20 mEq Oral Daily   Ensure Max Protein  11 oz Oral QHS   sodium chloride flush  3 mL Intravenous Q12H   umeclidinium bromide  1 puff Inhalation Daily   Continuous Infusions:  cefTRIAXone (ROCEPHIN)  IV 2 g (03/28/19 1744)   heparin 1,950 Units/hr (03/29/19 0713)     LOS: 7 days   Time spent= 25 mins    Thomasenia Bottomsawfikul Deziyah Arvin, MD [facility]  If 7PM-7AM, please contact night-coverage  03/29/2019, 11:03 AM

## 2019-03-29 NOTE — Progress Notes (Signed)
Referring Physician(s): Bell,E  Supervising Physician: Oley Balm  Patient Status:  [facility] - In-pt  Chief Complaint: Left lower abd/flank pain   Subjective: Pt doing fairly well; still has some intermittent left lower abd/flank discomfort; denies N/V; daughter in room   Allergies: Nsaids, Morphine and related, and Vicodin [hydrocodone-acetaminophen]  Medications: Prior to Admission medications   Medication Sig Start Date End Date Taking? Authorizing Provider  acetaminophen (TYLENOL) 500 MG tablet Take 2 tablets (1,000 mg total) by mouth every 8 (eight) hours. Patient taking differently: Take 1,000 mg by mouth See admin instructions. Take 1,000 mg by mouth every eight hours and do not exceed 4,000 mg/24 hours from all combined sources 01/24/17  Yes Babish, Molli Hazard, [name]  albuterol (PROVENTIL HFA;VENTOLIN HFA) 108 (90 Base) MCG/ACT inhaler Inhale 2 puffs into the lungs every 4 (four) hours as needed for wheezing or shortness of breath.    Yes [provider]  Amino Acids-Protein Hydrolys (FEEDING SUPPLEMENT, PRO-STAT SUGAR FREE 64,) LIQD Take 30 mLs by mouth 3 (three) times daily.    Yes [provider]  amiodarone (PACERONE) 200 MG tablet TAKE ONE TABLET BY MOUTH ONCE DAILY Patient taking differently: Take 200 mg by mouth daily.  10/29/16  Yes Hilty, Lisette Abu, MD  Artificial Saliva (BIOTENE DRY MOUTH MOISTURIZING) SOLN 1 spray by Mouth Rinse route every 2 (two) hours as needed (for dryness).   Yes [provider]  bumetanide (BUMEX) 1 MG tablet Take 1 tablet (1 mg total) by mouth 2 (two) times daily. 10/15/16  Yes Hilty, Lisette Abu, MD  cetirizine (ZYRTEC) 10 MG tablet Take 10 mg by mouth daily.    Yes [provider]  Cholecalciferol (VITAMIN D-3) 25 MCG (1000 UT) CAPS Take 1,000 Units by mouth daily.   Yes [provider]  docusate sodium 100 MG CAPS Take 100 mg by mouth 2 (two) times daily. 08/15/13  Yes Babish, Molli Hazard, [name]   famotidine (PEPCID) 20 MG tablet Take 1 tablet (20 mg total) by mouth 2 (two) times daily. 02/21/17  Yes Tyrone Nine, MD  ferrous sulfate (FERROUSUL) 325 (65 FE) MG tablet Take 1 tablet (325 mg total) by mouth 3 (three) times daily with meals. Patient taking differently: Take 325 mg by mouth 2 (two) times daily with a meal.  01/24/17  Yes Babish, Molli Hazard, [name]  gabapentin (NEURONTIN) 400 MG capsule TAKE 1 CAPSULE BY MOUTH THREE TIMES DAILY Patient taking differently: Take 400 mg by mouth 3 (three) times daily.  01/14/17  Yes [name], MD  midodrine (PROAMATINE) 10 MG tablet Take 1 tablet (10 mg total) by mouth 3 (three) times daily. 05/28/17  Yes Tyrone Nine, MD  Multiple Vitamins-Minerals (CERTAGEN PO) Take 1 tablet by mouth daily.    Yes [provider]  Multiple Vitamins-Minerals (PRESERVISION AREDS 2) CAPS Take 1 capsule by mouth 2 (two) times daily.   Yes [provider]  nutrition supplement, JUVEN, (JUVEN) PACK Take 1 packet by mouth 2 (two) times daily between meals. 02/21/17  Yes Tyrone Nine, MD  Nutritional Supplements (RESOURCE 2.0) LIQD Take 120 mLs by mouth at bedtime.   Yes [provider]  OXYGEN Inhale 2 L/min into the lungs as needed (for shortness of breath).    Yes [provider]  polyethylene glycol (MIRALAX / GLYCOLAX) packet Take 17 g by mouth 2 (two) times daily. Patient taking differently: Take 17 g by mouth See admin instructions. Mix 17 grams into 4-8 ounces of liquid  and drink two times a day 01/24/17  Yes Babish, Molli Hazard, [name]  potassium chloride (KLOR-CON) 20 MEQ packet Take 20 mEq by mouth daily.   Yes [provider]  PRESCRIPTION MEDICATION BiPAP: At bedtime   Yes [provider]  rivaroxaban (XARELTO) 20 MG TABS tablet Take 1 tablet (20 mg total) by mouth daily. 08/05/15  Yes Hilty, Lisette Abu, MD  rosuvastatin (CRESTOR) 20 MG tablet TAKE ONE TABLET BY MOUTH ONCE A WEEK, FRIDAY Patient taking  differently: Take 20 mg by mouth every Friday.  12/24/16  Yes Hilty, Lisette Abu, MD  simethicone (MYLICON) 125 MG chewable tablet Chew 125 mg by mouth 3 (three) times daily. FOR 5 DAYS 03/26/2019 03/26/19 Yes [provider]  SPIRIVA HANDIHALER 18 MCG inhalation capsule INHALE ONE PUFF BY MOUTH ONCE DAILY Patient taking differently: Place 18 mcg into inhaler and inhale daily.  03/18/16  Yes [name], MD  tamsulosin (FLOMAX) 0.4 MG CAPS capsule TAKE 1 CAPSULE BY MOUTH ONCE DAILY Patient taking differently: Take 0.4 mg by mouth daily.  12/24/16  Yes [name], MD  traMADol (ULTRAM) 50 MG tablet Take 1-2 tablets (50-100 mg total) by mouth every 12 (twelve) hours as needed for moderate pain or severe pain (and before hydrotherapy). Patient taking differently: Take 50 mg by mouth at bedtime.  05/28/17  Yes Tyrone Nine, MD  Magnesium 250 MG TABS Take 250 mg by mouth daily.     [provider]  Melatonin 3 MG CAPS Take 3 mg by mouth as needed (insomnia).    [provider]  Multiple Vitamin (MULTIVITAMIN WITH MINERALS) TABS tablet Take 1 tablet by mouth daily.    [provider]  protein supplement shake (PREMIER PROTEIN) LIQD Take 325 mLs (11 oz total) by mouth daily. Patient not taking: Reported on 04/01/2019 02/21/17   Tyrone Nine, MD     Vital Signs: BP (!) 106/51 (BP Location: Left Arm)   Pulse 60   Temp 97.9 F (36.6 C) (Oral)   Resp 20   Ht 5\' 10"  (1.778 m)   Wt 211 lb 3.2 oz (95.8 kg)   SpO2 93%   BMI 30.30 kg/m   Physical Exam awake/alert; left PCN, perinephric drains intact, outputs 675 cc PCN, 25 cc left inf/lat, 70 cc LLQ drain; perinephric drains flushed, some clot strands noted in tubing  Imaging: Ct Abdomen Pelvis W Contrast  Result Date: 03/27/2019 CLINICAL DATA:  Status post left percutaneous nephrostomy tube placement and percutaneous catheter drainage of adjacent left retroperitoneal abscess on 03/22/2019. EXAM: CT  ABDOMEN AND PELVIS WITH CONTRAST TECHNIQUE: Multidetector CT imaging of the abdomen and pelvis was performed using the standard protocol following bolus administration of intravenous contrast. CONTRAST:  13/09/2018 OMNIPAQUE IOHEXOL 300 MG/ML  SOLN COMPARISON:  04/09/2019 FINDINGS: Lower chest: Bilateral lower lobe atelectasis with trace bilateral pleural effusions. Hepatobiliary: The liver is unremarkable. The gallbladder is relatively contracted and contains some dependent calculi. No biliary dilatation. Pancreas: Unremarkable. No pancreatic ductal dilatation or surrounding inflammatory changes. Spleen: Normal in size without focal abnormality. Adrenals/Urinary Tract: Nephrostomy tube extends into the left renal pelvis. The left renal collecting system is decompressed and there is no hydronephrosis. Additional medial retroperitoneal drain has resulted in some decompression of the retroperitoneal abscess medial and inferior to the left kidney. There is a non drained component of abscess inferiorly with fluid anterior to the aortic bifurcation measuring 5.5 x 3.1 x 5.4 cm and communicating pocket lateral to this area measuring  approximately 2.8 x 2.1 x 3.8 cm. Adjacent edema extends inferiorly in the left pelvic retroperitoneal space. 4 cm high density lesion of the medial upper left kidney may represent a hemorrhagic cyst or complex cystic lesion. The right kidney remains normal in appearance. No adrenal masses. The bladder demonstrates mild wall thickening. Stomach/Bowel: Gastric distension with air-fluid level in the stomach. Moderate fecal material throughout much of the colon with mild colonic ileus suspected. Small bowel is nondilated. No free air. Vascular/Lymphatic: Stable aortoiliac atherosclerosis. No enlarged lymph nodes. Reproductive: Prostate is unremarkable. Other: Stable right inguinal hernia containing fat. Musculoskeletal: Stable L3 compression fracture and diffuse lumbar degenerative disc disease.  IMPRESSION: 1. Improvement in appearance of retroperitoneal infection after catheter drainage. There is a non drained component of abscess inferiorly with fluid anterior to the aortic bifurcation measuring 5.5 x 3.1 x 5.4 cm and communicating pocket lateral to this area measuring 2.8 x 2.1 x 3.8 cm. 2. Left renal collecting system is decompressed by the percutaneous nephrostomy tube. 3. 4 cm high density lesion of the medial upper left kidney may represent a hemorrhagic cyst or complex cystic lesion. Follow-up warranted. 4. Bilateral lower lobe atelectasis with trace bilateral pleural effusions. 5. Gastric distension with air-fluid level in the stomach. 6. Moderate fecal material throughout much of the colon with mild colonic ileus suspected. 7. Stable right inguinal hernia containing fat. 8. Stable L3 compression fracture. Electronically Signed   By: Irish LackGlenn  Yamagata M.D.   On: 03/27/2019 16:21   Ct Image Guided Drainage By Percutaneous Catheter  Result Date: 03/28/2019 INDICATION: History of left-sided urinary obstruction complicated by development of a perinephric abscess, post percutaneous nephrostomy catheter placement as well as perinephric abscess drainage catheter placement on 03/22/2019. Unfortunately, postprocedural CT scan of the abdomen pelvis performed 03/27/2019 demonstrates a residual undrained fluid collection about the inferior medial aspect of the left kidney for which the patient presents now for additional perinephric drainage catheter placement. EXAM: CT IMAGE GUIDED DRAINAGE BY PERCUTANEOUS CATHETER COMPARISON:  CT abdomen pelvis-2018-12-20; 03/27/2019; CT-guided percutaneous perinephric abscess drainage catheter placement-03/22/2019; image guided percutaneous nephrostomy catheter placement-03/22/2019 MEDICATIONS: The patient is currently admitted to the hospital and receiving intravenous antibiotics. The antibiotics were administered within an appropriate time frame prior to the initiation  of the procedure. ANESTHESIA/SEDATION: Moderate (conscious) sedation was employed during this procedure. A total of Versed 1 mg and Fentanyl 12.5 mcg was administered intravenously. Moderate Sedation Time: 18 minutes. The patient's level of consciousness and vital signs were monitored continuously by radiology nursing throughout the procedure under my direct supervision. CONTRAST:  None COMPLICATIONS: None immediate. PROCEDURE: Informed written consent was obtained from the patient after a discussion of the risks, benefits and alternatives to treatment. The patient was placed supine, slightly RPO on the CT gantry and a pre procedural CT was performed re-demonstrating the known abscess/fluid collection about the inferior medial aspect of the left kidney with dominant medial component measuring approximately 5.7 x 3.8 cm and smaller lateral component measuring approximately 2.3 x 2.3 cm (both collections seen on image 59, series 2). The procedure was planned. A timeout was performed prior to the initiation of the procedure. The skin overlying the lateral aspect of the left lower abdomen/pelvis was prepped and draped in the usual sterile fashion. The overlying soft tissues were anesthetized with 1% lidocaine with epinephrine. Appropriate trajectory was planned with the use of a 22 gauge spinal needle. An 18 gauge trocar needle was advanced sparing both abscess/fluid collections and a short Amplatz super stiff  wire was coiled within the dominant more medially positioned collection. Appropriate positioning was confirmed with a limited CT scan. The tract was serially dilated allowing placement of a 10 [place] all-purpose drainage catheter. Appropriate positioning was confirmed with a limited postprocedural CT scan. Approximately 30 ml of purulent fluid was aspirated. The tube was connected to a JP bulb and sutured in place. A dressing was placed. The patient tolerated the procedure well without immediate post procedural  complication. IMPRESSION: Successful CT guided placement of a 10 [place] all purpose drain catheter into the residual perinephric fluid collections adjacent to the inferior medial aspect the left kidney with aspiration of 30 mL of purulent fluid. Samples were sent to the laboratory as requested by the ordering clinical team. PLAN: - Recommend flushing of both existing percutaneous drainage catheters with 10 cc of saline b.i.d. - Once drainage catheter output is less than 10 cc per day (excluding flush volumes) repeat (preferably IV contrast-enhanced) CT scan of the abdomen and pelvis could be performed as indicated. - Note, both drainage will likely require drainage catheter injections prior to consideration of removal. - Continued nephrostomy management as per the urologic service, though ultimately, patient may be a candidate for attempted fluoroscopic guided antegrade left-sided ureteral stent placement. Electronically Signed   By: Sandi Mariscal M.D.   On: 03/28/2019 16:04    Labs:  CBC: Recent Labs    03/26/19 0046 03/27/19 0405 03/28/19 0336 03/29/19 0500  WBC 8.6 8.1 7.9 6.8  HGB 9.5* 9.5* 9.8* 9.1*  HCT 30.6* 31.1* 32.0* 29.7*  PLT 327 325 312 330    COAGS: Recent Labs    03/22/19 0759 03/24/19 1241  INR 1.1  --   APTT  --  36    BMP: Recent Labs    03/24/19 0349 03/26/19 0931 03/27/19 0405 03/28/19 0336  NA 143 137 139 140  K 3.8 4.0 3.9 3.8  CL 100 96* 96* 97*  CO2 35* 34* 36* 36*  GLUCOSE 93 137* 105* 103*  BUN 15 14 19 18   CALCIUM 8.4* 8.6* 9.0 8.9  CREATININE 0.53* 0.62 0.56* 0.58*  GFRNONAA >60 >60 >60 >60  GFRAA >60 >60 >60 >60    LIVER FUNCTION TESTS: Recent Labs    03/26/2019 1749  BILITOT 0.4  AST 19  ALT 15  ALKPHOS 64  PROT 5.6*  ALBUMIN 2.0*    Assessment and Plan: Pt with hx left sided urinary obst/hydronephrosis/nephrolithiasis, assoc perinephric abscesses; s/p left PCN/left RP fluid coll drain 03/22/19, left inf perinephric drain 11/11;  afebrile; WBC nl; hgb 9.1; prior cx- proteus; cont drain irrigation, check f/u CT once drain outputs minimal; will likely need drain injections as well before removal; additional plans as per urology   Electronically Signed: D. Rowe Robert, [name] 03/29/2019, 10:57 AM   I spent a total of 15 minutes at the the patient's bedside AND on the patient's hospital floor or unit, greater than 50% of which was counseling/coordinating care for left nephrostomy/perinephric abscess drains    Patient ID: [name], male   DOB: 10-25-1926, 85 y.o.   MRN: [id number]

## 2019-03-29 NOTE — Progress Notes (Signed)
ANTICOAGULATION CONSULT NOTE  Pharmacy Consult:  Heparin Indication: atrial fibrillation  Allergies  Allergen Reactions  . Nsaids Other (See Comments)    Stomach pain  . Morphine And Related Nausea And Vomiting and Other (See Comments)    Dizziness, light-headedness and "Opiates cause tightness in chest"  . Vicodin [Hydrocodone-Acetaminophen] Anxiety    Patient Measurements: Height: 5\' 10"  (177.8 cm) Weight: 211 lb 3.2 oz (95.8 kg) IBW/kg (Calculated) : 73 Heparin Dosing Weight: 92.3kg   Vital Signs: Temp: 97.5 F (36.4 C) (11/12 0447) Temp Source: Oral (11/12 0447) BP: 90/49 (11/12 0447) Pulse Rate: 60 (11/12 0447)  Labs: Recent Labs    03/26/19 0931  03/27/19 0405 03/28/19 0336 03/29/19 0500  HGB  --    < > 9.5* 9.8* 9.1*  HCT  --   --  31.1* 32.0* 29.7*  PLT  --   --  325 312 330  HEPARINUNFRC 0.45  --  0.49 0.55 0.42  CREATININE 0.62  --  0.56* 0.58*  --    < > = values in this interval not displayed.    Estimated Creatinine Clearance: 72.7 mL/min (A) (by C-G formula based on SCr of 0.58 mg/dL (L)).   Assessment: 72 yoM admitted 11/4 with perinephric fluid collection requiring nephrostomy tube. Pt on Xarelto PTA for hx AFib (CHADSVASc = 3) which has been held with need for procedures and bridged with IV heparin. Last dose of Xarelto was 11/4 am.  Patient is s/p catheter placement for abscess drainage. Heparin resumed last night. Heparin level this morning continues to be at goal, no bleeding issues noted overnight. CBC remains stable.   Goal of Therapy:  Heparin level 0.3-0.7 units/ml Monitor platelets by anticoagulation protocol: Yes   Plan:  Continue heparin at 1950 units/hr Daily heparin level and cbc  Erin Hearing PharmD., BCPS Clinical Pharmacist 03/29/2019 9:18 AM

## 2019-03-30 LAB — BASIC METABOLIC PANEL
Anion gap: 6 (ref 5–15)
BUN: 19 mg/dL (ref 8–23)
CO2: 37 mmol/L — ABNORMAL HIGH (ref 22–32)
Calcium: 9 mg/dL (ref 8.9–10.3)
Chloride: 96 mmol/L — ABNORMAL LOW (ref 98–111)
Creatinine, Ser: 0.55 mg/dL — ABNORMAL LOW (ref 0.61–1.24)
GFR calc Af Amer: >60 mL/min (ref >60)
GFR calc non Af Amer: >60 mL/min (ref >60)
Glucose, Bld: 108 mg/dL — ABNORMAL HIGH (ref 70–99)
Potassium: 4.3 mmol/L (ref 3.5–5.1)
Sodium: 139 mmol/L (ref 135–145)

## 2019-03-30 LAB — CBC
HCT: 31 % — ABNORMAL LOW (ref 39.0–52.0)
Hemoglobin: 9.5 g/dL — ABNORMAL LOW (ref 13.0–17.0)
MCH: 31.8 pg (ref 26.0–34.0)
MCHC: 30.6 g/dL (ref 30.0–36.0)
MCV: 103.7 fL — ABNORMAL HIGH (ref 80.0–100.0)
Platelets: 313 10*3/uL (ref 150–400)
RBC: 2.99 MIL/uL — ABNORMAL LOW (ref 4.22–5.81)
RDW: 14.6 % (ref 11.5–15.5)
WBC: 9.2 10*3/uL (ref 4.0–10.5)
nRBC: 0 % (ref 0.0–0.2)

## 2019-03-30 LAB — HEPARIN LEVEL (UNFRACTIONATED): Heparin Unfractionated: 0.36 IU/mL (ref 0.30–0.70)

## 2019-03-30 NOTE — Progress Notes (Signed)
Pt's urine has been changing since yesterday, was tea colored, now is more red colored, Hgb 9.5, Creatinine 0.55, pt is on Heparin drip-lab value is still pending, on call MD notified, will continue to monitor, Thanks Arvella Nigh RN.

## 2019-03-30 NOTE — Progress Notes (Signed)
PROGRESS NOTE    [name]  WJX:[id number] DOB: 27-Apr-1927 DOA: 04/12/2019 PCP: System, Pcp Not In    Brief Narrative:  85 y.o.malewith medical history significant foratrial fibrillation on Xarelto, chronic diastolic CHF, asthma/COPD, OSA on BiPAP, hypotension on midodrine, and chronic sacral ulcer, brought to the emergency department for evaluation of progressive abdominal distention with alternating constipation and watery stool.  Found to have perinephric fluid collection requiring nephrostomy tube. Patient is from long-term nursing home.  He used to walk with a walker and not done so for at least 6 months now.  Mostly wheelchair-bound.   Assessment & Plan:   Principal Problem:   Perinephric fluid collection Active Problems:   Asthma   Paroxysmal Atrial fibrillation - admitted with RVR   Chronic diastolic CHF (congestive heart failure) (HCC)   AF (paroxysmal atrial fibrillation) (HCC)   Hypokalemia   Ileus (HCC)   Closed compression fracture of L4 vertebra (HCC)   Sacral pressure ulcer   Renal cyst, left  Abdominal pain secondary to perinephric fluid collection/left-sided hydronephrosis with urinoma/complicated UTI: 03/22/2019, superior pole nephrostomy tube placed 03/28/2019, inferior pole nephrostomy tube placed due to collection not drained Urine cultures with Proteus sensitive to ceftriaxone.  Blood cultures negative.  We will continue ceftriaxone until patient has indwelling percutaneous tube. Minimal drain, however less than 50 cc recorded last 24 hours. Followed by radiology and urology, drain removal and management as per interventional radiology.  Hematuria: Patient has gross hematuria today on heparin.  Will hold heparin until urine clears up.  Hemoglobin is stable.  Close follow.  Chronic atrial fibrillation: Rate controlled.  On amiodarone.  Today in sinus rhythm.  Was on Xarelto prior to admission that is on hold pending clinical improvement.  Patient is on  heparin and now with gross hematuria. Hold heparin today.  Will resume once urine clears up.  Hypotension: Blood pressures fairly stable.  Patient on midodrine that he will continue.  Chronic sacral pressure ulcer stage IV present on admission: Followed by wound care.  Deconditioning/long-term nursing home resident: I will consult PT OT to start mobilizing.   DVT prophylaxis: Heparin infusion Code Status: Partial Family Communication: Daughter on the phone Disposition Plan: Back to nursing home when is stable.   Consultants:   Urology  Interventional radiology  Procedures:   Percutaneous drain, 11/5, 11/11 left kidney  Antimicrobials:   Rocephin, 04/12/2019---   Subjective: Patient seen and examined.  No overnight events.  He was very uncomfortable in the bed otherwise denies any nausea vomiting, denies any abdominal pain. Overnight events noted, he has dark urine with frank hematuria on his external catheter. Remains afebrile.  Objective: Vitals:   03/29/19 2010 03/30/19 0510 03/30/19 0514 03/30/19 0600  BP: (!) 117/50 (!) 97/43    Pulse: 70 68    Resp: 20 20    Temp: 99.4 F (37.4 C) 98.6 F (37 C)    TempSrc: Oral Oral    SpO2: 93% 91% 97%   Weight:    97.2 kg  Height:        Intake/Output Summary (Last 24 hours) at 03/30/2019 1010 Last data filed at 03/30/2019 0809 Gross per 24 hour  Intake 1927.12 ml  Output 2263 ml  Net -335.88 ml   Filed Weights   03/28/19 0500 03/29/19 0658 03/30/19 0600  Weight: 98.1 kg 95.8 kg 97.2 kg    Examination:  General exam: Appears calm and comfortable, chronically sick looking.  Not in any obvious distress but anxious. Respiratory  system: Clear to auscultation. Respiratory effort normal.  On 2 L oxygen. Cardiovascular system: S1 & S2 heard, RRR. No JVD, murmurs, rubs, gallops or clicks.  None pedal bilateral edema. Gastrointestinal system: Abdomen is nondistended, soft and nontender. No organomegaly or masses felt.  Normal bowel sounds heard. Both nephrostomy tube with minimal thick drainage with sediments. Central nervous system: Alert and oriented. No focal neurological deficits. Extremities: Symmetric 5 x 5 power.  He is profoundly weak on all extremities mostly lower extremities. Skin: Stage IV sacral decubitus present. Psychiatry: Judgement and insight appear normal. Mood & affect anxious.    Data Reviewed: I have personally reviewed following labs and imaging studies  CBC: Recent Labs  Lab 03/26/19 0046 03/27/19 0405 03/28/19 0336 03/29/19 0500 03/30/19 0451  WBC 8.6 8.1 7.9 6.8 9.2  HGB 9.5* 9.5* 9.8* 9.1* 9.5*  HCT 30.6* 31.1* 32.0* 29.7* 31.0*  MCV 102.3* 102.3* 102.2* 102.4* 103.7*  PLT 327 325 312 330 313   Basic Metabolic Panel: Recent Labs  Lab 03/26/19 0931 03/27/19 0405 03/28/19 0336 03/29/19 1027 03/30/19 0451  NA 137 139 140 141 139  K 4.0 3.9 3.8 3.8 4.3  CL 96* 96* 97* 98 96*  CO2 34* 36* 36* 34* 37*  GLUCOSE 137* 105* 103* 118* 108*  BUN 14 19 18 12 19   CREATININE 0.62 0.56* 0.58* 0.54* 0.55*  CALCIUM 8.6* 9.0 8.9 8.9 9.0   GFR: Estimated Creatinine Clearance: 73.2 mL/min (A) (by C-G formula based on SCr of 0.55 mg/dL (L)). Liver Function Tests: No results for input(s): AST, ALT, ALKPHOS, BILITOT, PROT, ALBUMIN in the last 168 hours. No results for input(s): LIPASE, AMYLASE in the last 168 hours. No results for input(s): AMMONIA in the last 168 hours. Coagulation Profile: No results for input(s): INR, PROTIME in the last 168 hours. Cardiac Enzymes: No results for input(s): CKTOTAL, CKMB, CKMBINDEX, TROPONINI in the last 168 hours. BNP (last 3 results) No results for input(s): PROBNP in the last 8760 hours. HbA1C: No results for input(s): HGBA1C in the last 72 hours. CBG: No results for input(s): GLUCAP in the last 168 hours. Lipid Profile: No results for input(s): CHOL, HDL, LDLCALC, TRIG, CHOLHDL, LDLDIRECT in the last 72 hours. Thyroid Function  Tests: No results for input(s): TSH, T4TOTAL, FREET4, T3FREE, THYROIDAB in the last 72 hours. Anemia Panel: No results for input(s): VITAMINB12, FOLATE, FERRITIN, TIBC, IRON, RETICCTPCT in the last 72 hours. Sepsis Labs: No results for input(s): PROCALCITON, LATICACIDVEN in the last 168 hours.  Recent Results (from the past 240 hour(s))  SARS CORONAVIRUS 2 (TAT 6-24 HRS) Nasopharyngeal Nasopharyngeal Swab     Status: None   Collection Time: 04/09/19  5:54 PM   Specimen: Nasopharyngeal Swab  Result Value Ref Range Status   SARS Coronavirus 2 NEGATIVE NEGATIVE Final    Comment: (NOTE) SARS-CoV-2 target nucleic acids are NOT DETECTED. The SARS-CoV-2 RNA is generally detectable in upper and lower respiratory specimens during the acute phase of infection. Negative results do not preclude SARS-CoV-2 infection, do not rule out co-infections with other pathogens, and should not be used as the sole basis for treatment or other patient management decisions. Negative results must be combined with clinical observations, patient history, and epidemiological information. The expected result is Negative. Fact Sheet for Patients: 13/04/20 Fact Sheet for Healthcare Providers: HairSlick.no This test is not yet approved or cleared by the [place]ir.com FDA and  has been authorized for detection and/or diagnosis of SARS-CoV-2 by FDA under an Emergency Use Authorization (  EUA). This EUA will remain  in effect (meaning this test can be used) for the duration of the COVID-19 declaration under Section 56 4(b)(1) of the Act, 21 U.S.C. section 360bbb-3(b)(1), unless the authorization is terminated or revoked sooner. Performed at [facility], [place] [address], [place], [place] [address]   UCx     Status: Abnormal   Collection Time: 2019/04/08  7:43 PM   Specimen: Urine, Random  Result Value Ref Range Status   Specimen Description URINE,  RANDOM  Final   Special Requests NONE  Final   Culture (A)  Final    <10,000 COLONIES/mL INSIGNIFICANT GROWTH Performed at [facility], [place] [address] [place], [place] [address]    Report Status 03/22/2019 FINAL  Final  Urine culture     Status: Abnormal   Collection Time: 03/22/19 11:20 AM   Specimen: Kidney; Urine  Result Value Ref Range Status   Specimen Description KIDNEY LEFT  Final   Special Requests   Final    NONE Performed at [facility], [place] [address], [place], [place] [address]    Culture 100 COLONIES/mL PROTEUS MIRABILIS (A)  Final   Report Status 03/25/2019 FINAL  Final   Organism ID, Bacteria PROTEUS MIRABILIS (A)  Final      Susceptibility   Proteus mirabilis - MIC*    AMPICILLIN >=32 RESISTANT Resistant     CEFAZOLIN 16 SENSITIVE Sensitive     CEFEPIME <=1 SENSITIVE Sensitive     CEFTAZIDIME <=1 SENSITIVE Sensitive     CEFTRIAXONE <=1 SENSITIVE Sensitive     CIPROFLOXACIN >=4 RESISTANT Resistant     GENTAMICIN <=1 SENSITIVE Sensitive     IMIPENEM 2 SENSITIVE Sensitive     TRIMETH/SULFA >=320 RESISTANT Resistant     AMPICILLIN/SULBACTAM >=32 RESISTANT Resistant     PIP/TAZO <=4 SENSITIVE Sensitive     * 100 COLONIES/mL PROTEUS MIRABILIS  Aerobic/Anaerobic Culture (surgical/deep wound)     Status: None   Collection Time: 03/22/19  3:14 PM   Specimen: Abscess  Result Value Ref Range Status   Specimen Description ABSCESS  Final   Special Requests LEFT RETROPERITONEAL  Final   Gram Stain   Final    ABUNDANT WBC PRESENT,BOTH PMN AND MONONUCLEAR NO ORGANISMS SEEN    Culture   Final    RARE PROTEUS MIRABILIS NO ANAEROBES ISOLATED Performed at [facility], [address], [place], [place] [address]    Report Status 03/27/2019 FINAL  Final   Organism ID, Bacteria PROTEUS MIRABILIS  Final      Susceptibility   Proteus mirabilis - MIC*    AMPICILLIN >=32 RESISTANT Resistant     CEFAZOLIN 16 SENSITIVE Sensitive     CEFEPIME <=1 SENSITIVE  Sensitive     CEFTAZIDIME <=1 SENSITIVE Sensitive     CEFTRIAXONE <=1 SENSITIVE Sensitive     CIPROFLOXACIN >=4 RESISTANT Resistant     GENTAMICIN <=1 SENSITIVE Sensitive     IMIPENEM 2 SENSITIVE Sensitive     TRIMETH/SULFA >=320 RESISTANT Resistant     AMPICILLIN/SULBACTAM >=32 RESISTANT Resistant     PIP/TAZO <=4 SENSITIVE Sensitive     * RARE PROTEUS MIRABILIS  MRSA PCR Screening     Status: None   Collection Time: 03/22/19  5:09 PM   Specimen: Nasopharyngeal  Result Value Ref Range Status   MRSA by PCR NEGATIVE NEGATIVE Final    Comment:        The GeneXpert MRSA Assay (FDA approved for  NASAL specimens only), is one component of a comprehensive MRSA colonization surveillance program. It is not intended to diagnose MRSA infection nor to guide or monitor treatment for MRSA infections. Performed at [facility], [address] [place], [place] [address]   Aerobic/Anaerobic Culture (surgical/deep wound)     Status: None (Preliminary result)   Collection Time: 03/28/19  4:19 PM   Specimen: Abscess  Result Value Ref Range Status   Specimen Description ABSCESS PERINEPHRIC DRAIN  Final   Special Requests Normal  Final   Gram Stain   Final    RARE WBC PRESENT,BOTH PMN AND MONONUCLEAR NO ORGANISMS SEEN    Culture   Final    NO GROWTH < 24 HOURS Performed at [facility], [address] [place], [place] [address]    Report Status PENDING  Incomplete         Radiology Studies: Ct Image Guided Drainage By Percutaneous Catheter  Result Date: 03/28/2019 INDICATION: History of left-sided urinary obstruction complicated by development of a perinephric abscess, post percutaneous nephrostomy catheter placement as well as perinephric abscess drainage catheter placement on 03/22/2019. Unfortunately, postprocedural CT scan of the abdomen pelvis performed 03/27/2019 demonstrates a residual undrained fluid collection about the inferior medial aspect of the left kidney  for which the patient presents now for additional perinephric drainage catheter placement. EXAM: CT IMAGE GUIDED DRAINAGE BY PERCUTANEOUS CATHETER COMPARISON:  CT abdomen pelvis-2018-10-13; 03/27/2019; CT-guided percutaneous perinephric abscess drainage catheter placement-03/22/2019; image guided percutaneous nephrostomy catheter placement-03/22/2019 MEDICATIONS: The patient is currently admitted to the hospital and receiving intravenous antibiotics. The antibiotics were administered within an appropriate time frame prior to the initiation of the procedure. ANESTHESIA/SEDATION: Moderate (conscious) sedation was employed during this procedure. A total of Versed 1 mg and Fentanyl 12.5 mcg was administered intravenously. Moderate Sedation Time: 18 minutes. The patient's level of consciousness and vital signs were monitored continuously by radiology nursing throughout the procedure under my direct supervision. CONTRAST:  None COMPLICATIONS: None immediate. PROCEDURE: Informed written consent was obtained from the patient after a discussion of the risks, benefits and alternatives to treatment. The patient was placed supine, slightly RPO on the CT gantry and a pre procedural CT was performed re-demonstrating the known abscess/fluid collection about the inferior medial aspect of the left kidney with dominant medial component measuring approximately 5.7 x 3.8 cm and smaller lateral component measuring approximately 2.3 x 2.3 cm (both collections seen on image 59, series 2). The procedure was planned. A timeout was performed prior to the initiation of the procedure. The skin overlying the lateral aspect of the left lower abdomen/pelvis was prepped and draped in the usual sterile fashion. The overlying soft tissues were anesthetized with 1% lidocaine with epinephrine. Appropriate trajectory was planned with the use of a 22 gauge spinal needle. An 18 gauge trocar needle was advanced sparing both abscess/fluid collections and a  short Amplatz super stiff wire was coiled within the dominant more medially positioned collection. Appropriate positioning was confirmed with a limited CT scan. The tract was serially dilated allowing placement of a 10 [place] all-purpose drainage catheter. Appropriate positioning was confirmed with a limited postprocedural CT scan. Approximately 30 ml of purulent fluid was aspirated. The tube was connected to a JP bulb and sutured in place. A dressing was placed. The patient tolerated the procedure well without immediate post procedural complication. IMPRESSION: Successful CT guided placement of a 10 French all purpose drain catheter into the residual perinephric fluid collections adjacent to the  inferior medial aspect the left kidney with aspiration of 30 mL of purulent fluid. Samples were sent to the laboratory as requested by the ordering clinical team. PLAN: - Recommend flushing of both existing percutaneous drainage catheters with 10 cc of saline b.i.d. - Once drainage catheter output is less than 10 cc per day (excluding flush volumes) repeat (preferably IV contrast-enhanced) CT scan of the abdomen and pelvis could be performed as indicated. - Note, both drainage will likely require drainage catheter injections prior to consideration of removal. - Continued nephrostomy management as per the urologic service, though ultimately, patient may be a candidate for attempted fluoroscopic guided antegrade left-sided ureteral stent placement. Electronically Signed   By: Simonne Come M.D.   On: 03/28/2019 16:04        Scheduled Meds: . amiodarone  200 mg Oral Daily  . famotidine  20 mg Oral BID  . feeding supplement (PRO-STAT SUGAR FREE 64)  30 mL Oral BID WC  . gabapentin  400 mg Oral TID  . midodrine  10 mg Oral TID WC  . multivitamin with minerals  1 tablet Oral Daily  . nutrition supplement (JUVEN)  1 packet Oral BID BM  . potassium chloride  20 mEq Oral Daily  . Ensure Max Protein  11 oz Oral QHS  .  sodium chloride flush  3 mL Intravenous Q12H  . umeclidinium bromide  1 puff Inhalation Daily   Continuous Infusions: . cefTRIAXone (ROCEPHIN)  IV 2 g (03/29/19 1809)  . heparin Stopped (03/30/19 0943)     LOS: 8 days    Time spent: 30 minutes    Dorcas Carrow, MD [facility] Pager [phone]

## 2019-03-30 NOTE — Progress Notes (Signed)
Pt is peeing blood, Hgb 9.5, Creatinine WNL, pt is on Heparin drip, on call MD aware, will continue to monitor, thanks Arvella Nigh RN.

## 2019-03-30 NOTE — Progress Notes (Signed)
OT Cancellation Note  Patient Details Name: [name] MRN: [id number] DOB: 27-Mar-1927   Cancelled Treatment:    Reason Eval/Treat Not Completed: Other (comment).  Pt is a LTC resident at [name].  Will defer further OT intervention to that venue of care.  Winson Eichorn 03/30/2019, 2:33 PM  Lesle Chris, OTR/L Acute Rehabilitation Services [phone] [facility] pager [phone] office 03/30/2019

## 2019-03-30 NOTE — Plan of Care (Signed)
Consultant Final Sign-Off Note    Assessment/Final recommendations  [name] is a 85 y.o. male followed by me for left renal calculi   Wound care (if applicable):    Diet at discharge: per primary team   Activity at discharge: per primary team   Follow-up appointment:  2 weeks with Dr. Ronne Binning [facility]   Pending results:  Unresulted Labs (From admission, onward)    Start     Ordered   03/29/19 1803  Culture, Urine  Once,   R     03/29/19 1803   03/29/19 0741  Basic metabolic panel  Daily,   R    Question:  Specimen collection method  Answer:  Lab=Lab collect   03/29/19 0740   03/27/19 0500  Heparin level (unfractionated)  Daily,   R    Question:  Specimen collection method  Answer:  Lab=Lab collect   03/25/19 1742   03/25/19 0500  CBC  Daily,   R    Question:  Specimen collection method  Answer:  Lab=Lab collect   03/24/19 1251           Medication recommendations:   Other recommendations:    Thank you for allowing [place] to participate in the care of your patient!  Please consult [place] again if you have further needs for your patient.  Wilkie Aye 03/30/2019 8:57 AM    Subjective     Objective  Vital signs in last 24 hours: Temp:  [97.8 F (36.6 C)-99.4 F (37.4 C)] 98.6 F (37 C) (11/13 0510) Pulse Rate:  [60-70] 68 (11/13 0510) Resp:  [20] 20 (11/13 0510) BP: (89-117)/(41-51) 97/43 (11/13 0510) SpO2:  [91 %-97 %] 97 % (11/13 0514) Weight:  [97.2 kg] 97.2 kg (11/13 0600)  General:    Pertinent labs and Studies: Recent Labs    03/28/19 0336 03/29/19 0500 03/30/19 0451  WBC 7.9 6.8 9.2  HGB 9.8* 9.1* 9.5*  HCT 32.0* 29.7* 31.0*   BMET Recent Labs    03/29/19 1027 03/30/19 0451  NA 141 139  K 3.8 4.3  CL 98 96*  CO2 34* 37*  GLUCOSE 118* 108*  BUN 12 19  CREATININE 0.54* 0.55*  CALCIUM 8.9 9.0   No results for input(s): LABURIN in the last 72 hours. Results for orders placed or performed during the hospital  encounter of 03-27-19  SARS CORONAVIRUS 2 (TAT 6-24 HRS) Nasopharyngeal Nasopharyngeal Swab     Status: None   Collection Time: 2019-03-27  5:54 PM   Specimen: Nasopharyngeal Swab  Result Value Ref Range Status   SARS Coronavirus 2 NEGATIVE NEGATIVE Final    Comment: (NOTE) SARS-CoV-2 target nucleic acids are NOT DETECTED. The SARS-CoV-2 RNA is generally detectable in upper and lower respiratory specimens during the acute phase of infection. Negative results do not preclude SARS-CoV-2 infection, do not rule out co-infections with other pathogens, and should not be used as the sole basis for treatment or other patient management decisions. Negative results must be combined with clinical observations, patient history, and epidemiological information. The expected result is Negative. Fact Sheet for Patients: HairSlick.no Fact Sheet for Healthcare Providers: quierodirigir.com This test is not yet approved or cleared by the [place] FDA and  has been authorized for detection and/or diagnosis of SARS-CoV-2 by FDA under an Emergency Use Authorization (EUA). This EUA will remain  in effect (meaning this test can be used) for the duration of the COVID-19 declaration under Section 56 4(b)(1) of the Act, 21 U.S.C.  section 360bbb-3(b)(1), unless the authorization is terminated or revoked sooner. Performed at [facility], [place] [address] [place], [place] [address]   UCx     Status: Abnormal   Collection Time: 03/19/2019  7:43 PM   Specimen: Urine, Random  Result Value Ref Range Status   Specimen Description URINE, RANDOM  Final   Special Requests NONE  Final   Culture (A)  Final    <10,000 COLONIES/mL INSIGNIFICANT GROWTH Performed at [facility], [place] [address] [place], [place] [address]    Report Status 03/22/2019 FINAL  Final  Urine culture     Status: Abnormal   Collection Time: 03/22/19 11:20 AM   Specimen:  Kidney; Urine  Result Value Ref Range Status   Specimen Description KIDNEY LEFT  Final   Special Requests   Final    NONE Performed at [facility], [place] [address] [place], [place] [address]    Culture 100 COLONIES/mL PROTEUS MIRABILIS (A)  Final   Report Status 03/25/2019 FINAL  Final   Organism ID, Bacteria PROTEUS MIRABILIS (A)  Final      Susceptibility   Proteus mirabilis - MIC*    AMPICILLIN >=32 RESISTANT Resistant     CEFAZOLIN 16 SENSITIVE Sensitive     CEFEPIME <=1 SENSITIVE Sensitive     CEFTAZIDIME <=1 SENSITIVE Sensitive     CEFTRIAXONE <=1 SENSITIVE Sensitive     CIPROFLOXACIN >=4 RESISTANT Resistant     GENTAMICIN <=1 SENSITIVE Sensitive     IMIPENEM 2 SENSITIVE Sensitive     TRIMETH/SULFA >=320 RESISTANT Resistant     AMPICILLIN/SULBACTAM >=32 RESISTANT Resistant     PIP/TAZO <=4 SENSITIVE Sensitive     * 100 COLONIES/mL PROTEUS MIRABILIS  MRSA PCR Screening     Status: None   Collection Time: 03/22/19  5:09 PM   Specimen: Nasopharyngeal  Result Value Ref Range Status   MRSA by PCR NEGATIVE NEGATIVE Final    Comment:        The GeneXpert MRSA Assay (FDA approved for NASAL specimens only), is one component of a comprehensive MRSA colonization surveillance program. It is not intended to diagnose MRSA infection nor to guide or monitor treatment for MRSA infections. Performed at [facility], [place] [address] [place], [place] [address]   Aerobic/Anaerobic Culture (surgical/deep wound)     Status: None (Preliminary result)   Collection Time: 03/28/19  4:19 PM   Specimen: Abscess  Result Value Ref Range Status   Specimen Description ABSCESS PERINEPHRIC DRAIN  Final   Special Requests Normal  Final   Gram Stain   Final    RARE WBC PRESENT,BOTH PMN AND MONONUCLEAR NO ORGANISMS SEEN    Culture   Final    NO GROWTH < 24 HOURS Performed at [facility], [place] [address], [place], [place] [address]    Report Status PENDING  Incomplete     Imaging: No results found.

## 2019-03-30 NOTE — Evaluation (Addendum)
Physical Therapy Evaluation Patient Details Name: [name] MRN: [id number] DOB: [birth year] Today's Date: 03/30/2019   History of Present Illness  85 y.o. male with medical history significant for atrial fibrillation on Xarelto, chronic diastolic CHF, asthma/COPD, OSA on BiPAP, hypotension on midodrine, and chronic sacral ulcer, brought to the emergency department for evaluation of progressive abdominal distention with alternating constipation and watery stool.  Found to have perinephric fluid collection requiring nephrostomy tube.  Clinical Impression  Pt demonstrates deficits in functional mobility, balance, endurance, ROM, strength, and power. Pt requires significant assistance to perform all functional mobility at this time, and requires assistance to maintain sitting balance. Pt receives PT services at SNF, although also reporting the facility won't let him get out of bed. Pt with goal of improving sitting balance, which this PT feels is achievable with acute PT services and continued services at SNF.    Follow Up Recommendations SNF;Supervision/Assistance - 24 hour    Equipment Recommendations  None recommended by PT    Recommendations for Other Services       Precautions / Restrictions Precautions Precautions: Fall Restrictions Weight Bearing Restrictions: No      Mobility  Bed Mobility Overal bed mobility: Needs Assistance Bed Mobility: Rolling;Sidelying to Sit;Sit to Supine Rolling: Max assist Sidelying to sit: Max assist;HOB elevated   Sit to supine: Max assist;HOB elevated      Transfers                    Ambulation/Gait                Stairs            Wheelchair Mobility    Modified Rankin (Stroke Patients Only)       Balance Overall balance assessment: Needs assistance Sitting-balance support: Bilateral upper extremity supported;Feet supported Sitting balance-Leahy Scale: Poor Sitting balance - Comments: modA with BUE  support, able to hold for 10 seconds with BUE support and minG Postural control: Left lateral lean                                   Pertinent Vitals/Pain Pain Assessment: Faces Faces Pain Scale: Hurts even more Pain Location: back Pain Descriptors / Indicators: Aching Pain Intervention(s): Limited activity within patient's tolerance    Home Living Family/patient expects to be discharged to:: Skilled nursing facility                 Additional Comments: PT ling term resident at [facility] since 2018    Prior Function Level of Independence: Needs assistance   Gait / Transfers Assistance Needed: Pt reports being bed  bound for past couple months, continuing to receive PT services           Hand Dominance   Dominant Hand: Right    Extremity/Trunk Assessment   Upper Extremity Assessment Upper Extremity Assessment: Generalized weakness    Lower Extremity Assessment Lower Extremity Assessment: Generalized weakness(LLE knee flexion gorssly limited since 2018 TKA)    Cervical / Trunk Assessment Cervical / Trunk Assessment: Normal  Communication   Communication: 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  Cognition Arousal/Alertness: Awake/alert Behavior During Therapy: WFL for tasks assessed/performed Overall Cognitive Status: Within Functional Limits for tasks assessed  General Comments General comments (skin integrity, edema, etc.): Pt on 3L [place] during session, saturating at 88-90% in supine    Exercises     Assessment/Plan    PT Assessment Patient needs continued PT services  PT Problem List Decreased strength;Decreased range of motion;Decreased activity tolerance;Decreased balance;Decreased mobility;Decreased knowledge of use of DME;Decreased safety awareness       PT Treatment Interventions DME instruction;Therapeutic activities;Functional mobility training;Therapeutic exercise;Balance training;Neuromuscular  re-education;Patient/family education;Wheelchair mobility training    PT Goals (Current goals can be found in the Care Plan section)  Acute Rehab PT Goals Patient Stated Goal: To sit up by himself PT Goal Formulation: With patient Time For Goal Achievement: May 01, 2019 Potential to Achieve Goals: Fair Additional Goals Additional Goal #1: Pt will mobilize in [facility] for 50' with modA, utilizing BUE.    Frequency Min 2X/week   Barriers to discharge        Co-evaluation               AM-PAC PT "6 Clicks" Mobility  Outcome Measure Help needed turning from your back to your side while in a flat bed without using bedrails?: Total Help needed moving from lying on your back to sitting on the side of a flat bed without using bedrails?: Total Help needed moving to and from a bed to a chair (including a wheelchair)?: Total Help needed standing up from a chair using your arms (e.g., wheelchair or bedside chair)?: Total Help needed to walk in hospital room?: Total Help needed climbing 3-5 steps with a railing? : Total 6 Click Score: 6    End of Session Equipment Utilized During Treatment: Oxygen Activity Tolerance: Patient tolerated treatment well Patient left: in bed;with call bell/phone within reach;with family/visitor present Nurse Communication: Mobility status PT Visit Diagnosis: Muscle weakness (generalized) (M62.81)    Time: 9629-5284 PT Time Calculation (min) (ACUTE ONLY): 45 min   Charges:   PT Evaluation $PT Eval Low Complexity: 1 Low     $Therapeutic Activity 8-22 mins      Zenaida Niece, PT, DPT Acute Rehabilitation Pager: [phone]   Zenaida Niece 03/30/2019, 2:33 PM

## 2019-03-30 NOTE — Plan of Care (Signed)
  Problem: Nutrition: Goal: Adequate nutrition will be maintained Outcome: Completed/Met   Problem: Pain Managment: Goal: General experience of comfort will improve Outcome: Completed/Met

## 2019-03-30 NOTE — Progress Notes (Signed)
ANTICOAGULATION CONSULT NOTE  Pharmacy Consult:  Heparin Indication: atrial fibrillation  Allergies  Allergen Reactions  . Nsaids Other (See Comments)    Stomach pain  . Morphine And Related Nausea And Vomiting and Other (See Comments)    Dizziness, light-headedness and "Opiates cause tightness in chest"  . Vicodin [Hydrocodone-Acetaminophen] Anxiety    Patient Measurements: Height: 5\' 10"  (177.8 cm) Weight: 214 lb 4.6 oz (97.2 kg) IBW/kg (Calculated) : 73 Heparin Dosing Weight: 92.3kg   Vital Signs: Temp: 98.6 F (37 C) (11/13 0510) Temp Source: Oral (11/13 0510) BP: 97/43 (11/13 0510) Pulse Rate: 68 (11/13 0510)  Labs: Recent Labs    03/28/19 0336 03/29/19 0500 03/29/19 1027 03/30/19 0451  HGB 9.8* 9.1*  --  9.5*  HCT 32.0* 29.7*  --  31.0*  PLT 312 330  --  313  HEPARINUNFRC 0.55 0.42  --  0.36  CREATININE 0.58*  --  0.54* 0.55*    Estimated Creatinine Clearance: 73.2 mL/min (A) (by C-G formula based on SCr of 0.55 mg/dL (L)).   Assessment: 10 yoM admitted 11/4 with perinephric fluid collection requiring nephrostomy tube. Pt on Xarelto PTA for hx AFib (CHADSVASc = 3) which has been held with need for procedures and bridged with IV heparin. Last dose of Xarelto was 11/4 am.  Patient is s/p catheter placement for abscess drainage. Heparin resumed 11/11. Heparin level this morning continues to be at goal, appears that patient is having hematuria, heparin at low end of goal. Will continue to follow along.   Goal of Therapy:  Heparin level 0.3-0.7 units/ml Monitor platelets by anticoagulation protocol: Yes   Plan:  Continue heparin at 1950 units/hr Daily heparin level and cbc Follow hematuria   Erin Hearing PharmD., BCPS Clinical Pharmacist 03/30/2019 9:19 AM

## 2019-03-30 NOTE — Progress Notes (Signed)
Referring Physician(s): Dr. Antionette Char  Supervising Physician: Gilmer Mor  Patient Status:  [facility] - In-pt  Chief Complaint: Left hydronephrosis Urinoma  Subjective: PT in with patient, trying to get to sit up in bed.  New hematuria overnight.  Heparin on hold.  PCN and 2 perinephric collection drains in place without issue.  Allergies: Nsaids, Morphine and related, and Vicodin [hydrocodone-acetaminophen]  Medications: Prior to Admission medications   Medication Sig Start Date End Date Taking? Authorizing Provider  acetaminophen (TYLENOL) 500 MG tablet Take 2 tablets (1,000 mg total) by mouth every 8 (eight) hours. Patient taking differently: Take 1,000 mg by mouth See admin instructions. Take 1,000 mg by mouth every eight hours and do not exceed 4,000 mg/24 hours from all combined sources 01/24/17  Yes Babish, Molli Hazard, [name]  albuterol (PROVENTIL HFA;VENTOLIN HFA) 108 (90 Base) MCG/ACT inhaler Inhale 2 puffs into the lungs every 4 (four) hours as needed for wheezing or shortness of breath.    Yes [provider]  Amino Acids-Protein Hydrolys (FEEDING SUPPLEMENT, PRO-STAT SUGAR FREE 64,) LIQD Take 30 mLs by mouth 3 (three) times daily.    Yes [provider]  amiodarone (PACERONE) 200 MG tablet TAKE ONE TABLET BY MOUTH ONCE DAILY Patient taking differently: Take 200 mg by mouth daily.  10/29/16  Yes Hilty, Lisette Abu, MD  Artificial Saliva (BIOTENE DRY MOUTH MOISTURIZING) SOLN 1 spray by Mouth Rinse route every 2 (two) hours as needed (for dryness).   Yes [provider]  bumetanide (BUMEX) 1 MG tablet Take 1 tablet (1 mg total) by mouth 2 (two) times daily. 10/15/16  Yes Hilty, Lisette Abu, MD  cetirizine (ZYRTEC) 10 MG tablet Take 10 mg by mouth daily.    Yes [provider]  Cholecalciferol (VITAMIN D-3) 25 MCG (1000 UT) CAPS Take 1,000 Units by mouth daily.   Yes [provider]  docusate sodium 100 MG CAPS Take 100 mg by mouth 2 (two) times  daily. 08/15/13  Yes Babish, Molli Hazard, [name]  famotidine (PEPCID) 20 MG tablet Take 1 tablet (20 mg total) by mouth 2 (two) times daily. 02/21/17  Yes Tyrone Nine, MD  ferrous sulfate (FERROUSUL) 325 (65 FE) MG tablet Take 1 tablet (325 mg total) by mouth 3 (three) times daily with meals. Patient taking differently: Take 325 mg by mouth 2 (two) times daily with a meal.  01/24/17  Yes Babish, Molli Hazard, [name]  gabapentin (NEURONTIN) 400 MG capsule TAKE 1 CAPSULE BY MOUTH THREE TIMES DAILY Patient taking differently: Take 400 mg by mouth 3 (three) times daily.  01/14/17  Yes [name], MD  lactulose [facility]) 10 GM/15ML solution Take 20 g by mouth daily. FOR 5 DAYS 03/19/19 03/24/19 Yes [provider]  metoCLOPramide (REGLAN) 5 MG tablet Take 2.5 mg by mouth 2 (two) times daily. FOR 7 DAYS 03/19/19 03/26/19 Yes [provider]  midodrine (PROAMATINE) 10 MG tablet Take 1 tablet (10 mg total) by mouth 3 (three) times daily. 05/28/17  Yes Tyrone Nine, MD  Multiple Vitamins-Minerals (CERTAGEN PO) Take 1 tablet by mouth daily.    Yes [provider]  Multiple Vitamins-Minerals (PRESERVISION AREDS 2) CAPS Take 1 capsule by mouth 2 (two) times daily.   Yes [provider]  nutrition supplement, JUVEN, (JUVEN) PACK Take 1 packet by mouth 2 (two) times daily between meals. 02/21/17  Yes Tyrone Nine, MD  Nutritional Supplements (RESOURCE 2.0) LIQD Take 120 mLs by mouth at bedtime.   Yes [provider]  OXYGEN Inhale 2 L/min into the lungs as needed (for shortness of breath).    Yes [provider]  polyethylene glycol (MIRALAX / GLYCOLAX) packet Take 17 g by mouth 2 (two) times daily. Patient taking differently: Take 17 g by mouth See admin instructions. Mix 17 grams into 4-8 ounces of liquid and drink two times a day 01/24/17  Yes Babish, Molli HazardMatthew, [name]  potassium chloride (KLOR-CON) 20 MEQ packet Take 20 mEq by mouth daily.   Yes [provider]   PRESCRIPTION MEDICATION BiPAP: At bedtime   Yes [provider]  rivaroxaban (XARELTO) 20 MG TABS tablet Take 1 tablet (20 mg total) by mouth daily. 08/05/15  Yes Hilty, Lisette AbuKenneth C, MD  rosuvastatin (CRESTOR) 20 MG tablet TAKE ONE TABLET BY MOUTH ONCE A WEEK, FRIDAY Patient taking differently: Take 20 mg by mouth every Friday.  12/24/16  Yes Hilty, Lisette AbuKenneth C, MD  simethicone (MYLICON) 125 MG chewable tablet Chew 125 mg by mouth 3 (three) times daily. FOR 5 DAYS 04/11/2019 03/26/19 Yes [provider]  SPIRIVA HANDIHALER 18 MCG inhalation capsule INHALE ONE PUFF BY MOUTH ONCE DAILY Patient taking differently: Place 18 mcg into inhaler and inhale daily.  03/18/16  Yes Gordy SaversKwiatkowski, Peter F, MD  tamsulosin (FLOMAX) 0.4 MG CAPS capsule TAKE 1 CAPSULE BY MOUTH ONCE DAILY Patient taking differently: Take 0.4 mg by mouth daily.  12/24/16  Yes Gordy SaversKwiatkowski, Peter F, MD  traMADol (ULTRAM) 50 MG tablet Take 1-2 tablets (50-100 mg total) by mouth every 12 (twelve) hours as needed for moderate pain or severe pain (and before hydrotherapy). Patient taking differently: Take 50 mg by mouth at bedtime.  05/28/17  Yes Tyrone NineGrunz, Ryan B, MD  Magnesium 250 MG TABS Take 250 mg by mouth daily.     [provider]  Melatonin 3 MG CAPS Take 3 mg by mouth as needed (insomnia).    [provider]  Multiple Vitamin (MULTIVITAMIN WITH MINERALS) TABS tablet Take 1 tablet by mouth daily.    [provider]  protein supplement shake (PREMIER PROTEIN) LIQD Take 325 mLs (11 oz total) by mouth daily. Patient not taking: Reported on 03/23/2019 02/21/17   Tyrone NineGrunz, Ryan B, MD     Vital Signs: BP (!) 111/38 (BP Location: Left Arm)    Pulse 60    Temp 98.3 F (36.8 C) (Oral)    Resp (!) 21    Ht 5\' 10"  (1.778 m)    Wt 214 lb 4.6 oz (97.2 kg)    SpO2 95%    BMI 30.75 kg/m   Physical Exam  NAD, alert Abdomen: soft, non-tender, non-distended.   Back:  Left urinoma drain in place with bloody, purulent  output.  L PCN in place with dark, clear urine.  Imaging: Ct Abdomen Pelvis W Contrast  Result Date: 03/27/2019 CLINICAL DATA:  Status post left percutaneous nephrostomy tube placement and percutaneous catheter drainage of adjacent left retroperitoneal abscess on 03/22/2019. EXAM: CT ABDOMEN AND PELVIS WITH CONTRAST TECHNIQUE: Multidetector CT imaging of the abdomen and pelvis was performed using the standard protocol following bolus administration of intravenous contrast. CONTRAST:  100mL OMNIPAQUE IOHEXOL 300 MG/ML  SOLN COMPARISON:  08-27-2018 FINDINGS: Lower chest: Bilateral lower lobe atelectasis with trace bilateral pleural effusions. Hepatobiliary: The liver is unremarkable. The gallbladder is relatively contracted and contains some dependent calculi. No biliary dilatation. Pancreas: Unremarkable. No pancreatic ductal dilatation or surrounding inflammatory changes. Spleen: Normal in size without focal abnormality. Adrenals/Urinary Tract: Nephrostomy tube extends into  the left renal pelvis. The left renal collecting system is decompressed and there is no hydronephrosis. Additional medial retroperitoneal drain has resulted in some decompression of the retroperitoneal abscess medial and inferior to the left kidney. There is a non drained component of abscess inferiorly with fluid anterior to the aortic bifurcation measuring 5.5 x 3.1 x 5.4 cm and communicating pocket lateral to this area measuring approximately 2.8 x 2.1 x 3.8 cm. Adjacent edema extends inferiorly in the left pelvic retroperitoneal space. 4 cm high density lesion of the medial upper left kidney may represent a hemorrhagic cyst or complex cystic lesion. The right kidney remains normal in appearance. No adrenal masses. The bladder demonstrates mild wall thickening. Stomach/Bowel: Gastric distension with air-fluid level in the stomach. Moderate fecal material throughout much of the colon with mild colonic ileus suspected. Small bowel is  nondilated. No free air. Vascular/Lymphatic: Stable aortoiliac atherosclerosis. No enlarged lymph nodes. Reproductive: Prostate is unremarkable. Other: Stable right inguinal hernia containing fat. Musculoskeletal: Stable L3 compression fracture and diffuse lumbar degenerative disc disease. IMPRESSION: 1. Improvement in appearance of retroperitoneal infection after catheter drainage. There is a non drained component of abscess inferiorly with fluid anterior to the aortic bifurcation measuring 5.5 x 3.1 x 5.4 cm and communicating pocket lateral to this area measuring 2.8 x 2.1 x 3.8 cm. 2. Left renal collecting system is decompressed by the percutaneous nephrostomy tube. 3. 4 cm high density lesion of the medial upper left kidney may represent a hemorrhagic cyst or complex cystic lesion. Follow-up warranted. 4. Bilateral lower lobe atelectasis with trace bilateral pleural effusions. 5. Gastric distension with air-fluid level in the stomach. 6. Moderate fecal material throughout much of the colon with mild colonic ileus suspected. 7. Stable right inguinal hernia containing fat. 8. Stable L3 compression fracture. Electronically Signed   By: Irish Lack M.D.   On: 03/27/2019 16:21   Ct Image Guided Drainage By Percutaneous Catheter  Result Date: 03/28/2019 INDICATION: History of left-sided urinary obstruction complicated by development of a perinephric abscess, post percutaneous nephrostomy catheter placement as well as perinephric abscess drainage catheter placement on 03/22/2019. Unfortunately, postprocedural CT scan of the abdomen pelvis performed 03/27/2019 demonstrates a residual undrained fluid collection about the inferior medial aspect of the left kidney for which the patient presents now for additional perinephric drainage catheter placement. EXAM: CT IMAGE GUIDED DRAINAGE BY PERCUTANEOUS CATHETER COMPARISON:  CT abdomen pelvis-03/24/2019; 03/27/2019; CT-guided percutaneous perinephric abscess drainage  catheter placement-03/22/2019; image guided percutaneous nephrostomy catheter placement-03/22/2019 MEDICATIONS: The patient is currently admitted to the hospital and receiving intravenous antibiotics. The antibiotics were administered within an appropriate time frame prior to the initiation of the procedure. ANESTHESIA/SEDATION: Moderate (conscious) sedation was employed during this procedure. A total of Versed 1 mg and Fentanyl 12.5 mcg was administered intravenously. Moderate Sedation Time: 18 minutes. The patient's level of consciousness and vital signs were monitored continuously by radiology nursing throughout the procedure under my direct supervision. CONTRAST:  None COMPLICATIONS: None immediate. PROCEDURE: Informed written consent was obtained from the patient after a discussion of the risks, benefits and alternatives to treatment. The patient was placed supine, slightly RPO on the CT gantry and a pre procedural CT was performed re-demonstrating the known abscess/fluid collection about the inferior medial aspect of the left kidney with dominant medial component measuring approximately 5.7 x 3.8 cm and smaller lateral component measuring approximately 2.3 x 2.3 cm (both collections seen on image 59, series 2). The procedure was planned. A timeout was performed prior  to the initiation of the procedure. The skin overlying the lateral aspect of the left lower abdomen/pelvis was prepped and draped in the usual sterile fashion. The overlying soft tissues were anesthetized with 1% lidocaine with epinephrine. Appropriate trajectory was planned with the use of a 22 gauge spinal needle. An 18 gauge trocar needle was advanced sparing both abscess/fluid collections and a short Amplatz super stiff wire was coiled within the dominant more medially positioned collection. Appropriate positioning was confirmed with a limited CT scan. The tract was serially dilated allowing placement of a 10 [place] all-purpose drainage  catheter. Appropriate positioning was confirmed with a limited postprocedural CT scan. Approximately 30 ml of purulent fluid was aspirated. The tube was connected to a JP bulb and sutured in place. A dressing was placed. The patient tolerated the procedure well without immediate post procedural complication. IMPRESSION: Successful CT guided placement of a 10 French all purpose drain catheter into the residual perinephric fluid collections adjacent to the inferior medial aspect the left kidney with aspiration of 30 mL of purulent fluid. Samples were sent to the laboratory as requested by the ordering clinical team. PLAN: - Recommend flushing of both existing percutaneous drainage catheters with 10 cc of saline b.i.d. - Once drainage catheter output is less than 10 cc per day (excluding flush volumes) repeat (preferably IV contrast-enhanced) CT scan of the abdomen and pelvis could be performed as indicated. - Note, both drainage will likely require drainage catheter injections prior to consideration of removal. - Continued nephrostomy management as per the urologic service, though ultimately, patient may be a candidate for attempted fluoroscopic guided antegrade left-sided ureteral stent placement. Electronically Signed   By: Sandi Mariscal M.D.   On: 03/28/2019 16:04    Labs:  CBC: Recent Labs    03/27/19 0405 03/28/19 0336 03/29/19 0500 03/30/19 0451  WBC 8.1 7.9 6.8 9.2  HGB 9.5* 9.8* 9.1* 9.5*  HCT 31.1* 32.0* 29.7* 31.0*  PLT 325 312 330 313    COAGS: Recent Labs    03/22/19 0759 03/24/19 1241  INR 1.1  --   APTT  --  36    BMP: Recent Labs    03/27/19 0405 03/28/19 0336 03/29/19 1027 03/30/19 0451  NA 139 140 141 139  K 3.9 3.8 3.8 4.3  CL 96* 97* 98 96*  CO2 36* 36* 34* 37*  GLUCOSE 105* 103* 118* 108*  BUN 19 18 12 19   CALCIUM 9.0 8.9 8.9 9.0  CREATININE 0.56* 0.58* 0.54* 0.55*  GFRNONAA >60 >60 >60 >60  GFRAA >60 >60 >60 >60    LIVER FUNCTION TESTS: Recent Labs     03/27/2019 1749  BILITOT 0.4  AST 19  ALT 15  ALKPHOS 64  PROT 5.6*  ALBUMIN 2.0*    Assessment and Plan: Left hydronephrosis s/p percutaneous nephrostomy tube placement 03/22/19 by Dr. Roanna Banning s/p drain placement by Dr. Anselm Pancoast 03/22/19 Second perinephric fluid collection drain placement by Dr. Pascal Lux 03/28/19 Patient resting comfortably.  PT at bedside attempting to get patient up in bed.  Both abscess drains in place and intact. Serosanguinous output in both.   NGTD from second drain placed 11/11. PCN with minimal output.  Urine now with blood.  Heparin stopped by hospitalist. No evidence of blood by way of PCN.   Continue current management.  IR following.     Electronically Signed: Docia Barrier, PA 03/30/2019, 2:08 PM   I spent a total of 15 Minutes at the the patient's bedside AND  on the patient's hospital floor or unit, greater than 50% of which was counseling/coordinating care for left hydronephrosis, urinoma.

## 2019-03-30 NOTE — Care Management Important Message (Signed)
Important Message  Patient Details  Name: IMARI SIVERTSEN MRN: [id number] Date of Birth: November 22, 1926   Medicare Important Message Given:  Yes     Shelda Altes 03/30/2019, 2:45 PM

## 2019-03-31 LAB — BASIC METABOLIC PANEL
Anion gap: 9 (ref 5–15)
BUN: 21 mg/dL (ref 8–23)
CO2: 37 mmol/L — ABNORMAL HIGH (ref 22–32)
Calcium: 9.3 mg/dL (ref 8.9–10.3)
Chloride: 93 mmol/L — ABNORMAL LOW (ref 98–111)
Creatinine, Ser: 0.61 mg/dL (ref 0.61–1.24)
GFR calc Af Amer: >60 mL/min (ref >60)
GFR calc non Af Amer: >60 mL/min (ref >60)
Glucose, Bld: 107 mg/dL — ABNORMAL HIGH (ref 70–99)
Potassium: 4.4 mmol/L (ref 3.5–5.1)
Sodium: 139 mmol/L (ref 135–145)

## 2019-03-31 LAB — URINE CULTURE: Culture: NO GROWTH

## 2019-03-31 LAB — HEPARIN LEVEL (UNFRACTIONATED)
Heparin Unfractionated: <0.1 IU/mL — ABNORMAL LOW (ref 0.30–0.70)
Heparin Unfractionated: <0.1 IU/mL — ABNORMAL LOW (ref 0.30–0.70)

## 2019-03-31 LAB — CBC
HCT: 29.4 % — ABNORMAL LOW (ref 39.0–52.0)
Hemoglobin: 8.8 g/dL — ABNORMAL LOW (ref 13.0–17.0)
MCH: 30.9 pg (ref 26.0–34.0)
MCHC: 29.9 g/dL — ABNORMAL LOW (ref 30.0–36.0)
MCV: 103.2 fL — ABNORMAL HIGH (ref 80.0–100.0)
Platelets: 287 10*3/uL (ref 150–400)
RBC: 2.85 MIL/uL — ABNORMAL LOW (ref 4.22–5.81)
RDW: 14.6 % (ref 11.5–15.5)
WBC: 8.7 10*3/uL (ref 4.0–10.5)
nRBC: 0 % (ref 0.0–0.2)

## 2019-03-31 NOTE — Progress Notes (Signed)
ANTICOAGULATION CONSULT NOTE  Pharmacy Consult:  Heparin Indication: atrial fibrillation  Allergies  Allergen Reactions  . Nsaids Other (See Comments)    Stomach pain  . Morphine And Related Nausea And Vomiting and Other (See Comments)    Dizziness, light-headedness and "Opiates cause tightness in chest"  . Vicodin [Hydrocodone-Acetaminophen] Anxiety    Patient Measurements: Height: 5\' 10"  (177.8 cm) Weight: 219 lb 5.7 oz (99.5 kg) IBW/kg (Calculated) : 73 Heparin Dosing Weight: 92.3kg   Vital Signs: Temp: 98.9 F (37.2 C) (11/14 0331) Temp Source: Oral (11/14 0331) BP: 100/47 (11/14 0331) Pulse Rate: 61 (11/14 0331)  Labs: Recent Labs    03/29/19 0500 03/29/19 1027 03/30/19 0451 03/31/19 0521  HGB 9.1*  --  9.5* 8.8*  HCT 29.7*  --  31.0* 29.4*  PLT 330  --  313 287  HEPARINUNFRC 0.42  --  0.36 <0.10*  CREATININE  --  0.54* 0.55* 0.61    Estimated Creatinine Clearance: 74 mL/min (by C-G formula based on SCr of 0.61 mg/dL).   Assessment: 3 yoM admitted 11/4 with perinephric fluid collection requiring nephrostomy tube. Pt on Xarelto PTA for hx AFib (CHADSVASc = 3) which has been held with need for procedures and bridged with IV heparin. Last dose of Xarelto was 11/4 am.  Patient is s/p catheter placement for abscess drainage. Heparin resumed 11/11.  Then put on hold 11/13 at about 1000.  Heparin level this morning is undetectable, which is expected given that the drip has been on hold for his hematuria. Will continue to follow along.   Goal of Therapy:  Heparin level 0.3-0.7 units/ml Monitor platelets by anticoagulation protocol: Yes   Plan:  Heparin drip on hold for hematuria Daily heparin level and cbc Follow hematuria   Alanda Slim, PharmD, [facility] Clinical Pharmacist Please see AMION for all Pharmacists' Contact Phone Numbers 03/31/2019, 6:51 AM

## 2019-03-31 NOTE — Progress Notes (Signed)
Air-bed for patient was brought up by transport around shift change. Will pass it on to June RN to transfer patient to the air-bed to relieve pressure.

## 2019-03-31 NOTE — Progress Notes (Signed)
ANTICOAGULATION CONSULT NOTE - Follow Up Consult  Pharmacy Consult for heparin Indication: atrial fibrillation  Labs: Recent Labs    03/29/19 0500 03/29/19 1027 03/30/19 0451 03/31/19 0521 03/31/19 2229  HGB 9.1*  --  9.5* 8.8*  --   HCT 29.7*  --  31.0* 29.4*  --   PLT 330  --  313 287  --   HEPARINUNFRC 0.42  --  0.36 <0.10* <0.10*  CREATININE  --  0.54* 0.55* 0.61  --     Assessment: 85yo male subtherapeutic on heparin after resumed though was previously therapeutic at this rate; RN reports gtt was off for some time while pt was switching to air bed shortly before lab draw; no bleeding per RN, no blood currently visible in urine.  Goal of Therapy:  Heparin level 0.3-0.7 units/ml   Plan:  Will increase heparin gtt very slightly to 2000 units/hr and check level in 8 hours.    Wynona Neat, PharmD, BCPS  03/31/2019,11:23 PM

## 2019-03-31 NOTE — Progress Notes (Signed)
ANTICOAGULATION CONSULT NOTE  Pharmacy Consult:  Heparin Indication: atrial fibrillation  Allergies  Allergen Reactions  . Nsaids Other (See Comments)    Stomach pain  . Morphine And Related Nausea And Vomiting and Other (See Comments)    Dizziness, light-headedness and "Opiates cause tightness in chest"  . Vicodin [Hydrocodone-Acetaminophen] Anxiety    Patient Measurements: Height: 5\' 10"  (177.8 cm) Weight: 219 lb 5.7 oz (99.5 kg) IBW/kg (Calculated) : 73 Heparin Dosing Weight: 92.3kg   Vital Signs: Temp: 98.9 F (37.2 C) (11/14 0331) Temp Source: Oral (11/14 0331) BP: 100/47 (11/14 0331) Pulse Rate: 61 (11/14 0331)  Labs: Recent Labs    03/29/19 0500 03/29/19 1027 03/30/19 0451 03/31/19 0521  HGB 9.1*  --  9.5* 8.8*  HCT 29.7*  --  31.0* 29.4*  PLT 330  --  313 287  HEPARINUNFRC 0.42  --  0.36 <0.10*  CREATININE  --  0.54* 0.55* 0.61    Estimated Creatinine Clearance: 74 mL/min (by C-G formula based on SCr of 0.61 mg/dL).   Assessment: 65 yoM admitted 11/4 with perinephric fluid collection requiring nephrostomy tube. Pt on Xarelto PTA for hx AFib (CHADSVASc = 3) which has been held with need for procedures and bridged with IV heparin. Last dose of Xarelto was 11/4 am.  Patient is s/p catheter placement for abscess drainage. Heparin resumed 11/11 then put on hold 11/13 at about 1000 d/t hematuria.   Pharmacy now re-consulted to resume heparin infusion without a bolus. Patient was previously therapeutic on 1950 units/hour but now has a heparin level subtherapeutic at <0.1 d/t holding the infusion. H&H today is slightly lower than yesterday at 8.8/29.4, plts are wnl at 287.   Goal of Therapy:  Heparin level 0.3-0.7 units/ml Monitor platelets by anticoagulation protocol: Yes   Plan:  NO bolus  Start heparin at 1950 units/hour  8 hour heparin level  Daily heparin level and cbc Follow hematuria and other signs of bleeding    Thank you,   Eddie Candle,  PharmD PGY-1 Pharmacy Resident   Please check amion for clinical pharmacist contact number

## 2019-03-31 NOTE — Plan of Care (Signed)

## 2019-03-31 NOTE — Progress Notes (Signed)
PROGRESS NOTE    [name]  JJK:[id number] DOB: Feb 28, 1927 DOA: 03/31/2019 PCP: System, Pcp Not In    Brief Narrative:  85 y.o.malewith medical history significant foratrial fibrillation on Xarelto, chronic diastolic CHF, asthma/COPD, OSA on BiPAP, hypotension on midodrine, and chronic sacral ulcer, brought to the emergency department for evaluation of progressive abdominal distention with alternating constipation and watery stool.  Found to have perinephric fluid collection requiring nephrostomy tube. Patient is from long-term nursing home.  He used to walk with a walker and not done so for at least 6 months now.  Mostly wheelchair-bound.   Assessment & Plan:   Principal Problem:   Perinephric fluid collection Active Problems:   Asthma   Paroxysmal Atrial fibrillation - admitted with RVR   Chronic diastolic CHF (congestive heart failure) (HCC)   AF (paroxysmal atrial fibrillation) (HCC)   Hypokalemia   Ileus (HCC)   Closed compression fracture of L4 vertebra (HCC)   Sacral pressure ulcer   Renal cyst, left  Abdominal pain secondary to perinephric fluid collection/left-sided hydronephrosis with urinoma/complicated UTI: 37/05/6965, superior pole nephrostomy tube placed 03/28/2019, inferior pole nephrostomy tube placed due to collection not drained Urine cultures with Proteus sensitive to ceftriaxone.  Blood cultures negative.  We will continue ceftriaxone until patient has indwelling percutaneous tube.  Now with minimal drain. Followed by radiology and urology, drain removal and management as per interventional radiology.  Hematuria: Patient had gross hematuria on heparin.  Held for last 24 hours.  Hemoglobin dropped by 1 unit.  Urine clearing up with no more frank hematuria.  Will rechallenge today with heparin and monitor.  Chronic atrial fibrillation: Rate controlled.  On amiodarone.  in sinus rhythm.  Was on Xarelto prior to admission that is on hold pending clinical  improvement.  Challenge with heparin.  Hypotension: Blood pressures fairly stable.  Patient on midodrine that he will continue.  Chronic sacral pressure ulcer stage IV present on admission: Followed by wound care.  Deconditioning/long-term nursing home resident: Work with PT OT.  Unsure whether he is rehab candidate.    DVT prophylaxis: Heparin infusion Code Status: Partial Family Communication: Son at the bedside. Disposition Plan: Back to nursing home when stable.   Consultants:   Urology  Interventional radiology  Procedures:   Percutaneous drain, 11/5, 11/11 left kidney  Antimicrobials:   Rocephin, 03/26/2019---   Subjective: Patient seen and examined in the morning rounds.  Patient son at the bedside.  No overnight events.  Urine still darkish color, no frank blood.  Patient himself had no complaints.  Denies abdominal pain. Objective: Vitals:   03/31/19 0200 03/31/19 0331 03/31/19 0812 03/31/19 1310  BP:  (!) 100/47  (!) 102/41  Pulse:  61  68  Resp: 20 19  18   Temp:  98.9 F (37.2 C)  99.1 F (37.3 C)  TempSrc:  Oral  Oral  SpO2:  90% 98% 92%  Weight:  99.5 kg    Height:        Intake/Output Summary (Last 24 hours) at 03/31/2019 1339 Last data filed at 03/31/2019 0850 Gross per 24 hour  Intake 1096 ml  Output 1190 ml  Net -94 ml   Filed Weights   03/29/19 0658 03/30/19 0600 03/31/19 0331  Weight: 95.8 kg 97.2 kg 99.5 kg    Examination:  General exam: Appears calm and comfortable, chronically sick looking.  Not in any obvious distress and sleepy. Respiratory system: Clear to auscultation. Respiratory effort normal.  On 2 L oxygen. Cardiovascular  system: S1 & S2 heard, RRR. No JVD, murmurs, rubs, gallops or clicks.  None pedal bilateral edema. Gastrointestinal system: Abdomen is nondistended, soft and nontender. No organomegaly or masses felt. Normal bowel sounds heard. Both nephrostomy tube with minimal clear urine.   Condom catheter with thick  urine with sediments. Central nervous system: Alert and oriented. No focal neurological deficits. Extremities: Symmetric 5 x 5 power.  He is profoundly weak on all extremities mostly lower extremities. Skin: Stage IV sacral decubitus present. Psychiatry: Judgement and insight appear normal. Mood & affect anxious.    Data Reviewed: I have personally reviewed following labs and imaging studies  CBC: Recent Labs  Lab 03/27/19 0405 03/28/19 0336 03/29/19 0500 03/30/19 0451 03/31/19 0521  WBC 8.1 7.9 6.8 9.2 8.7  HGB 9.5* 9.8* 9.1* 9.5* 8.8*  HCT 31.1* 32.0* 29.7* 31.0* 29.4*  MCV 102.3* 102.2* 102.4* 103.7* 103.2*  PLT 325 312 330 313 287   Basic Metabolic Panel: Recent Labs  Lab 03/27/19 0405 03/28/19 0336 03/29/19 1027 03/30/19 0451 03/31/19 0521  NA 139 140 141 139 139  K 3.9 3.8 3.8 4.3 4.4  CL 96* 97* 98 96* 93*  CO2 36* 36* 34* 37* 37*  GLUCOSE 105* 103* 118* 108* 107*  BUN 19 18 12 19 21   CREATININE 0.56* 0.58* 0.54* 0.55* 0.61  CALCIUM 9.0 8.9 8.9 9.0 9.3   GFR: Estimated Creatinine Clearance: 74 mL/min (by C-G formula based on SCr of 0.61 mg/dL). Liver Function Tests: No results for input(s): AST, ALT, ALKPHOS, BILITOT, PROT, ALBUMIN in the last 168 hours. No results for input(s): LIPASE, AMYLASE in the last 168 hours. No results for input(s): AMMONIA in the last 168 hours. Coagulation Profile: No results for input(s): INR, PROTIME in the last 168 hours. Cardiac Enzymes: No results for input(s): CKTOTAL, CKMB, CKMBINDEX, TROPONINI in the last 168 hours. BNP (last 3 results) No results for input(s): PROBNP in the last 8760 hours. HbA1C: No results for input(s): HGBA1C in the last 72 hours. CBG: No results for input(s): GLUCAP in the last 168 hours. Lipid Profile: No results for input(s): CHOL, HDL, LDLCALC, TRIG, CHOLHDL, LDLDIRECT in the last 72 hours. Thyroid Function Tests: No results for input(s): TSH, T4TOTAL, FREET4, T3FREE, THYROIDAB in the last  72 hours. Anemia Panel: No results for input(s): VITAMINB12, FOLATE, FERRITIN, TIBC, IRON, RETICCTPCT in the last 72 hours. Sepsis Labs: No results for input(s): PROCALCITON, LATICACIDVEN in the last 168 hours.  Recent Results (from the past 240 hour(s))  SARS CORONAVIRUS 2 (TAT 6-24 HRS) Nasopharyngeal Nasopharyngeal Swab     Status: None   Collection Time: 03-25-19  5:54 PM   Specimen: Nasopharyngeal Swab  Result Value Ref Range Status   SARS Coronavirus 2 NEGATIVE NEGATIVE Final    Comment: (NOTE) SARS-CoV-2 target nucleic acids are NOT DETECTED. The SARS-CoV-2 RNA is generally detectable in upper and lower respiratory specimens during the acute phase of infection. Negative results do not preclude SARS-CoV-2 infection, do not rule out co-infections with other pathogens, and should not be used as the sole basis for treatment or other patient management decisions. Negative results must be combined with clinical observations, patient history, and epidemiological information. The expected result is Negative. Fact Sheet for Patients: 13/04/20 Fact Sheet for Healthcare Providers: HairSlick.no This test is not yet approved or cleared by the quierodirigir.com FDA and  has been authorized for detection and/or diagnosis of SARS-CoV-2 by FDA under an Emergency Use Authorization (EUA). This EUA will remain  in effect (  meaning this test can be used) for the duration of the COVID-19 declaration under Section 56 4(b)(1) of the Act, 21 U.S.C. section 360bbb-3(b)(1), unless the authorization is terminated or revoked sooner. Performed at [facility], [address] [place], [place] [address]   UCx     Status: Abnormal   Collection Time: 03/22/2019  7:43 PM   Specimen: Urine, Random  Result Value Ref Range Status   Specimen Description URINE, RANDOM  Final   Special Requests NONE  Final   Culture (A)  Final    <10,000  COLONIES/mL INSIGNIFICANT GROWTH Performed at [facility], [address] [place], [place] [address]    Report Status 03/22/2019 FINAL  Final  Urine culture     Status: Abnormal   Collection Time: 03/22/19 11:20 AM   Specimen: Kidney; Urine  Result Value Ref Range Status   Specimen Description KIDNEY LEFT  Final   Special Requests   Final    NONE Performed at [facility], [address] [place], [place] [address]    Culture 100 COLONIES/mL PROTEUS MIRABILIS (A)  Final   Report Status 03/25/2019 FINAL  Final   Organism ID, Bacteria PROTEUS MIRABILIS (A)  Final      Susceptibility   Proteus mirabilis - MIC*    AMPICILLIN >=32 RESISTANT Resistant     CEFAZOLIN 16 SENSITIVE Sensitive     CEFEPIME <=1 SENSITIVE Sensitive     CEFTAZIDIME <=1 SENSITIVE Sensitive     CEFTRIAXONE <=1 SENSITIVE Sensitive     CIPROFLOXACIN >=4 RESISTANT Resistant     GENTAMICIN <=1 SENSITIVE Sensitive     IMIPENEM 2 SENSITIVE Sensitive     TRIMETH/SULFA >=320 RESISTANT Resistant     AMPICILLIN/SULBACTAM >=32 RESISTANT Resistant     PIP/TAZO <=4 SENSITIVE Sensitive     * 100 COLONIES/mL PROTEUS MIRABILIS  Aerobic/Anaerobic Culture (surgical/deep wound)     Status: None   Collection Time: 03/22/19  3:14 PM   Specimen: Abscess  Result Value Ref Range Status   Specimen Description ABSCESS  Final   Special Requests LEFT RETROPERITONEAL  Final   Gram Stain   Final    ABUNDANT WBC PRESENT,BOTH PMN AND MONONUCLEAR NO ORGANISMS SEEN    Culture   Final    RARE PROTEUS MIRABILIS NO ANAEROBES ISOLATED Performed at [facility], [address] [place], [place] [address]    Report Status 03/27/2019 FINAL  Final   Organism ID, Bacteria PROTEUS MIRABILIS  Final      Susceptibility   Proteus mirabilis - MIC*    AMPICILLIN >=32 RESISTANT Resistant     CEFAZOLIN 16 SENSITIVE Sensitive     CEFEPIME <=1 SENSITIVE Sensitive     CEFTAZIDIME <=1 SENSITIVE Sensitive     CEFTRIAXONE <=1  SENSITIVE Sensitive     CIPROFLOXACIN >=4 RESISTANT Resistant     GENTAMICIN <=1 SENSITIVE Sensitive     IMIPENEM 2 SENSITIVE Sensitive     TRIMETH/SULFA >=320 RESISTANT Resistant     AMPICILLIN/SULBACTAM >=32 RESISTANT Resistant     PIP/TAZO <=4 SENSITIVE Sensitive     * RARE PROTEUS MIRABILIS  MRSA PCR Screening     Status: None   Collection Time: 03/22/19  5:09 PM   Specimen: Nasopharyngeal  Result Value Ref Range Status   MRSA by PCR NEGATIVE NEGATIVE Final    Comment:        The GeneXpert MRSA Assay (FDA approved for NASAL specimens only), is one component of a  comprehensive MRSA colonization surveillance program. It is not intended to diagnose MRSA infection nor to guide or monitor treatment for MRSA infections. Performed at [facility], [address] [place], [place] [address]   Aerobic/Anaerobic Culture (surgical/deep wound)     Status: None (Preliminary result)   Collection Time: 03/28/19  4:19 PM   Specimen: Abscess  Result Value Ref Range Status   Specimen Description ABSCESS PERINEPHRIC DRAIN  Final   Special Requests Normal  Final   Gram Stain   Final    RARE WBC PRESENT,BOTH PMN AND MONONUCLEAR NO ORGANISMS SEEN Performed at [facility], [address] [place], [place] [address]    Culture RARE PROTEUS MIRABILIS  Final   Report Status PENDING  Incomplete   Organism ID, Bacteria PROTEUS MIRABILIS  Final      Susceptibility   Proteus mirabilis - MIC*    AMPICILLIN >=32 RESISTANT Resistant     CEFAZOLIN 16 SENSITIVE Sensitive     CEFEPIME <=1 SENSITIVE Sensitive     CEFTAZIDIME <=1 SENSITIVE Sensitive     CEFTRIAXONE <=1 SENSITIVE Sensitive     CIPROFLOXACIN >=4 RESISTANT Resistant     GENTAMICIN <=1 SENSITIVE Sensitive     IMIPENEM 2 SENSITIVE Sensitive     TRIMETH/SULFA >=320 RESISTANT Resistant     AMPICILLIN/SULBACTAM >=32 RESISTANT Resistant     PIP/TAZO <=4 SENSITIVE Sensitive     * RARE PROTEUS MIRABILIS  Culture, Urine      Status: None   Collection Time: 03/30/19  6:54 AM   Specimen: Urine, Random  Result Value Ref Range Status   Specimen Description URINE, RANDOM BAG,BED  Final   Special Requests NONE  Final   Culture   Final    NO GROWTH Performed at [facility], [address], [place], [place] [address]    Report Status 03/31/2019 FINAL  Final         Radiology Studies: No results found.      Scheduled Meds: . amiodarone  200 mg Oral Daily  . famotidine  20 mg Oral BID  . feeding supplement (PRO-STAT SUGAR FREE 64)  30 mL Oral BID WC  . gabapentin  400 mg Oral TID  . midodrine  10 mg Oral TID WC  . multivitamin with minerals  1 tablet Oral Daily  . nutrition supplement (JUVEN)  1 packet Oral BID BM  . potassium chloride  20 mEq Oral Daily  . Ensure Max Protein  11 oz Oral QHS  . sodium chloride flush  3 mL Intravenous Q12H  . umeclidinium bromide  1 puff Inhalation Daily   Continuous Infusions: . cefTRIAXone (ROCEPHIN)  IV Stopped (03/30/19 1706)  . heparin 1,950 Units/hr (03/31/19 1108)     LOS: 9 days    Time spent: 30 minutes    Dorcas Carrow, MD [facility] Pager [phone]

## 2019-03-31 NOTE — Progress Notes (Signed)
Pt still have pink bloody urine mix with green color, md and pharmacist awared, hold heparin per pharmacist and md dictated on note, continue monitor

## 2019-04-01 LAB — CBC
HCT: 28.4 % — ABNORMAL LOW (ref 39.0–52.0)
Hemoglobin: 9 g/dL — ABNORMAL LOW (ref 13.0–17.0)
MCH: 31.9 pg (ref 26.0–34.0)
MCHC: 31.7 g/dL (ref 30.0–36.0)
MCV: 100.7 fL — ABNORMAL HIGH (ref 80.0–100.0)
Platelets: 284 10*3/uL (ref 150–400)
RBC: 2.82 MIL/uL — ABNORMAL LOW (ref 4.22–5.81)
RDW: 14.7 % (ref 11.5–15.5)
WBC: 7.5 10*3/uL (ref 4.0–10.5)
nRBC: 0 % (ref 0.0–0.2)

## 2019-04-01 LAB — HEPARIN LEVEL (UNFRACTIONATED)
Heparin Unfractionated: 0.33 IU/mL (ref 0.30–0.70)
Heparin Unfractionated: 0.31 IU/mL (ref 0.30–0.70)

## 2019-04-01 LAB — SARS CORONAVIRUS 2 (TAT 6-24 HRS): SARS Coronavirus 2: NEGATIVE

## 2019-04-01 NOTE — Progress Notes (Signed)
PROGRESS NOTE    [name]  BSW:[id number] DOB: [birth year] DOA: 04/09/2019 PCP: System, Pcp Not In    Brief Narrative:  85 y.o.malewith medical history significant foratrial fibrillation on Xarelto, chronic diastolic CHF, asthma/COPD, OSA on BiPAP, hypotension on midodrine, and chronic sacral ulcer, brought to the emergency department for evaluation of progressive abdominal distention with alternating constipation and watery stool.  Found to have perinephric fluid collection requiring nephrostomy tube. Patient is from long-term nursing home.  He used to walk with a walker and not done so for at least 6 months now.  Mostly wheelchair-bound.   Assessment & Plan:   Principal Problem:   Perinephric fluid collection Active Problems:   Asthma   Paroxysmal Atrial fibrillation - admitted with RVR   Chronic diastolic CHF (congestive heart failure) (HCC)   AF (paroxysmal atrial fibrillation) (HCC)   Hypokalemia   Ileus (HCC)   Closed compression fracture of L4 vertebra (HCC)   Sacral pressure ulcer   Renal cyst, left  Abdominal pain secondary to perinephric fluid collection/left-sided hydronephrosis with urinoma/complicated UTI: 03/22/2019, superior pole nephrostomy tube placed 03/28/2019, inferior pole nephrostomy tube placed due to collection not drained Urine cultures with Proteus sensitive to ceftriaxone.  Blood cultures negative.  We will continue ceftriaxone until patient has indwelling percutaneous tube.  Now with minimal drain. Followed by radiology and urology, drain removal and management as per interventional radiology. Hopefully drain is ready to come out.   Hematuria: Patient had gross hematuria on heparin.  Held for 24 hours and challenged with heparin. Urine clearing up with no more frank hematuria.  Continue on heparin.  Will change to Xarelto when the tubes are out.  Chronic atrial fibrillation: Rate controlled.  On amiodarone.  in sinus rhythm.  Was on Xarelto  prior to admission that is on hold pending clinical improvement.  Now on heparin.  Hypotension: Blood pressures fairly stable.  Patient on midodrine that he will continue.  Chronic sacral pressure ulcer stage IV present on admission: Followed by wound care.  Deconditioning/long-term nursing home resident: Work with PT OT.  Unsure whether he is rehab candidate.  Needs to go back to skilled nursing facility. COVID-19 test today in anticipation of discharge tomorrow.   DVT prophylaxis: Heparin infusion Code Status: Partial Family Communication: Son on the phone. Disposition Plan: Back to nursing home when stable.  Anticipate next 24 to 48 hours.   Consultants:   Urology  Interventional radiology  Procedures:   Percutaneous drain, 11/5, 11/11 left kidney  Antimicrobials:   Rocephin, 03/29/2019---   Subjective: Seen and examined.  No overnight events.  Remains sinus rhythm.  Urine is clear.  He is tired of getting pricked every now and then for blood testing.  Wants to sleep. Objective: Vitals:   03/31/19 2055 04/01/19 0456 04/01/19 0500 04/01/19 0758  BP:  (!) 105/51    Pulse:  63    Resp: 20 20    Temp:  98.8 F (37.1 C)    TempSrc:  Axillary    SpO2:  92%  95%  Weight:   96.6 kg   Height:        Intake/Output Summary (Last 24 hours) at 04/01/2019 1101 Last data filed at 04/01/2019 0843 Gross per 24 hour  Intake 1425.4 ml  Output 1295 ml  Net 130.4 ml   Filed Weights   03/30/19 0600 03/31/19 0331 04/01/19 0500  Weight: 97.2 kg 99.5 kg 96.6 kg    Examination:  General exam: Appears calm and  comfortable, chronically sick looking.  Not in any obvious distress and sleepy. Respiratory system: Clear to auscultation. Respiratory effort normal.  On 2 L oxygen. Cardiovascular system: S1 & S2 heard, RRR. No JVD, murmurs, rubs, gallops or clicks.  None pedal bilateral edema. Gastrointestinal system: Abdomen is nondistended, soft and nontender. No organomegaly or  masses felt. Normal bowel sounds heard. Both nephrostomy tube with minimal clear urine.   Condom catheter with clear urine. Central nervous system: Alert and oriented. No focal neurological deficits. Extremities: Symmetric 5 x 5 power.  He is profoundly weak on all extremities mostly lower extremities. Skin: Stage IV sacral decubitus present. Psychiatry: Judgement and insight appear normal. Mood & affect anxious.    Data Reviewed: I have personally reviewed following labs and imaging studies  CBC: Recent Labs  Lab 03/28/19 0336 03/29/19 0500 03/30/19 0451 03/31/19 0521 04/01/19 0731  WBC 7.9 6.8 9.2 8.7 7.5  HGB 9.8* 9.1* 9.5* 8.8* 9.0*  HCT 32.0* 29.7* 31.0* 29.4* 28.4*  MCV 102.2* 102.4* 103.7* 103.2* 100.7*  PLT 312 330 313 287 284   Basic Metabolic Panel: Recent Labs  Lab 03/27/19 0405 03/28/19 0336 03/29/19 1027 03/30/19 0451 03/31/19 0521  NA 139 140 141 139 139  K 3.9 3.8 3.8 4.3 4.4  CL 96* 97* 98 96* 93*  CO2 36* 36* 34* 37* 37*  GLUCOSE 105* 103* 118* 108* 107*  BUN 19 18 12 19 21   CREATININE 0.56* 0.58* 0.54* 0.55* 0.61  CALCIUM 9.0 8.9 8.9 9.0 9.3   GFR: Estimated Creatinine Clearance: 73 mL/min (by C-G formula based on SCr of 0.61 mg/dL). Liver Function Tests: No results for input(s): AST, ALT, ALKPHOS, BILITOT, PROT, ALBUMIN in the last 168 hours. No results for input(s): LIPASE, AMYLASE in the last 168 hours. No results for input(s): AMMONIA in the last 168 hours. Coagulation Profile: No results for input(s): INR, PROTIME in the last 168 hours. Cardiac Enzymes: No results for input(s): CKTOTAL, CKMB, CKMBINDEX, TROPONINI in the last 168 hours. BNP (last 3 results) No results for input(s): PROBNP in the last 8760 hours. HbA1C: No results for input(s): HGBA1C in the last 72 hours. CBG: No results for input(s): GLUCAP in the last 168 hours. Lipid Profile: No results for input(s): CHOL, HDL, LDLCALC, TRIG, CHOLHDL, LDLDIRECT in the last 72 hours.  Thyroid Function Tests: No results for input(s): TSH, T4TOTAL, FREET4, T3FREE, THYROIDAB in the last 72 hours. Anemia Panel: No results for input(s): VITAMINB12, FOLATE, FERRITIN, TIBC, IRON, RETICCTPCT in the last 72 hours. Sepsis Labs: No results for input(s): PROCALCITON, LATICACIDVEN in the last 168 hours.  Recent Results (from the past 240 hour(s))  Urine culture     Status: Abnormal   Collection Time: 03/22/19 11:20 AM   Specimen: Kidney; Urine  Result Value Ref Range Status   Specimen Description KIDNEY LEFT  Final   Special Requests   Final    NONE Performed at [facility], [address] [place], [place] [address]    Culture 100 COLONIES/mL PROTEUS MIRABILIS (A)  Final   Report Status 03/25/2019 FINAL  Final   Organism ID, Bacteria PROTEUS MIRABILIS (A)  Final      Susceptibility   Proteus mirabilis - MIC*    AMPICILLIN >=32 RESISTANT Resistant     CEFAZOLIN 16 SENSITIVE Sensitive     CEFEPIME <=1 SENSITIVE Sensitive     CEFTAZIDIME <=1 SENSITIVE Sensitive     CEFTRIAXONE <=1 SENSITIVE Sensitive     CIPROFLOXACIN >=4 RESISTANT Resistant  GENTAMICIN <=1 SENSITIVE Sensitive     IMIPENEM 2 SENSITIVE Sensitive     TRIMETH/SULFA >=320 RESISTANT Resistant     AMPICILLIN/SULBACTAM >=32 RESISTANT Resistant     PIP/TAZO <=4 SENSITIVE Sensitive     * 100 COLONIES/mL PROTEUS MIRABILIS  Aerobic/Anaerobic Culture (surgical/deep wound)     Status: None   Collection Time: 03/22/19  3:14 PM   Specimen: Abscess  Result Value Ref Range Status   Specimen Description ABSCESS  Final   Special Requests LEFT RETROPERITONEAL  Final   Gram Stain   Final    ABUNDANT WBC PRESENT,BOTH PMN AND MONONUCLEAR NO ORGANISMS SEEN    Culture   Final    RARE PROTEUS MIRABILIS NO ANAEROBES ISOLATED Performed at [facility], [place] [address] [place], [place] [address]    Report Status 03/27/2019 FINAL  Final   Organism ID, Bacteria PROTEUS MIRABILIS  Final      Susceptibility    Proteus mirabilis - MIC*    AMPICILLIN >=32 RESISTANT Resistant     CEFAZOLIN 16 SENSITIVE Sensitive     CEFEPIME <=1 SENSITIVE Sensitive     CEFTAZIDIME <=1 SENSITIVE Sensitive     CEFTRIAXONE <=1 SENSITIVE Sensitive     CIPROFLOXACIN >=4 RESISTANT Resistant     GENTAMICIN <=1 SENSITIVE Sensitive     IMIPENEM 2 SENSITIVE Sensitive     TRIMETH/SULFA >=320 RESISTANT Resistant     AMPICILLIN/SULBACTAM >=32 RESISTANT Resistant     PIP/TAZO <=4 SENSITIVE Sensitive     * RARE PROTEUS MIRABILIS  MRSA PCR Screening     Status: None   Collection Time: 03/22/19  5:09 PM   Specimen: Nasopharyngeal  Result Value Ref Range Status   MRSA by PCR NEGATIVE NEGATIVE Final    Comment:        The GeneXpert MRSA Assay (FDA approved for NASAL specimens only), is one component of a comprehensive MRSA colonization surveillance program. It is not intended to diagnose MRSA infection nor to guide or monitor treatment for MRSA infections. Performed at [facility], [place] [address], [place], [place] [address]   Aerobic/Anaerobic Culture (surgical/deep wound)     Status: None (Preliminary result)   Collection Time: 03/28/19  4:19 PM   Specimen: Abscess  Result Value Ref Range Status   Specimen Description ABSCESS PERINEPHRIC DRAIN  Final   Special Requests Normal  Final   Gram Stain   Final    RARE WBC PRESENT,BOTH PMN AND MONONUCLEAR NO ORGANISMS SEEN Performed at [facility], [address] [place], [place] [address]    Culture RARE PROTEUS MIRABILIS  Final   Report Status PENDING  Incomplete   Organism ID, Bacteria PROTEUS MIRABILIS  Final      Susceptibility   Proteus mirabilis - MIC*    AMPICILLIN >=32 RESISTANT Resistant     CEFAZOLIN 16 SENSITIVE Sensitive     CEFEPIME <=1 SENSITIVE Sensitive     CEFTAZIDIME <=1 SENSITIVE Sensitive     CEFTRIAXONE <=1 SENSITIVE Sensitive     CIPROFLOXACIN >=4 RESISTANT Resistant     GENTAMICIN <=1 SENSITIVE Sensitive     IMIPENEM 2  SENSITIVE Sensitive     TRIMETH/SULFA >=320 RESISTANT Resistant     AMPICILLIN/SULBACTAM >=32 RESISTANT Resistant     PIP/TAZO <=4 SENSITIVE Sensitive     * RARE PROTEUS MIRABILIS  Culture, Urine     Status: None   Collection Time: 03/30/19  6:54 AM   Specimen: Urine, Random  Result Value Ref Range  Status   Specimen Description URINE, RANDOM BAG,BED  Final   Special Requests NONE  Final   Culture   Final    NO GROWTH Performed at [facility], [address], [place], [place] [address]    Report Status 03/31/2019 FINAL  Final         Radiology Studies: No results found.      Scheduled Meds: . amiodarone  200 mg Oral Daily  . famotidine  20 mg Oral BID  . feeding supplement (PRO-STAT SUGAR FREE 64)  30 mL Oral BID WC  . gabapentin  400 mg Oral TID  . midodrine  10 mg Oral TID WC  . multivitamin with minerals  1 tablet Oral Daily  . nutrition supplement (JUVEN)  1 packet Oral BID BM  . potassium chloride  20 mEq Oral Daily  . Ensure Max Protein  11 oz Oral QHS  . sodium chloride flush  3 mL Intravenous Q12H  . umeclidinium bromide  1 puff Inhalation Daily   Continuous Infusions: . cefTRIAXone (ROCEPHIN)  IV 2 g (03/31/19 1732)  . heparin 2,000 Units/hr (04/01/19 0511)     LOS: 10 days    Time spent: 25 minutes    Dorcas Carrow, MD [facility] Pager [phone]

## 2019-04-01 NOTE — Progress Notes (Signed)
ANTICOAGULATION CONSULT NOTE  Pharmacy Consult:  Heparin Indication: atrial fibrillation  Allergies  Allergen Reactions  . Nsaids Other (See Comments)    Stomach pain  . Morphine And Related Nausea And Vomiting and Other (See Comments)    Dizziness, light-headedness and "Opiates cause tightness in chest"  . Vicodin [Hydrocodone-Acetaminophen] Anxiety    Patient Measurements: Height: 5\' 10"  (177.8 cm) Weight: 213 lb (96.6 kg) IBW/kg (Calculated) : 73 Heparin Dosing Weight: 92.3kg   Vital Signs: Temp: 98.8 F (37.1 C) (11/15 0456) Temp Source: Axillary (11/15 0456) BP: 105/51 (11/15 0456) Pulse Rate: 63 (11/15 0456)  Labs: Recent Labs    03/29/19 1027  03/30/19 0451 03/31/19 0521 03/31/19 2229 04/01/19 0731  HGB  --    < > 9.5* 8.8*  --  9.0*  HCT  --   --  31.0* 29.4*  --  28.4*  PLT  --   --  313 287  --  284  HEPARINUNFRC  --    < > 0.36 <0.10* <0.10* 0.33  CREATININE 0.54*  --  0.55* 0.61  --   --    < > = values in this interval not displayed.    Estimated Creatinine Clearance: 73 mL/min (by C-G formula based on SCr of 0.61 mg/dL).   Assessment: 38 yoM admitted 11/4 with perinephric fluid collection requiring nephrostomy tube. Pt on Xarelto PTA for hx AFib (CHADSVASc = 3) which has been held with need for procedures and bridged with IV heparin. Last dose of Xarelto was 11/4 am. Patient is s/p catheter placement for abscess drainage. Heparin resumed 11/11 then put on hold 11/13 at about 1000 d/t hematuria. Now pharmacy re-consulted to resume heparin infusion without a bolus.   Heparin was held yesterday until about 1500, heparin level following hold of infusion was subtherapeutic so rate was increased to 2000 units/hour. Heparin level this morning is therapeutic at 0.33. H&H is stable at 9.0/28.4, plts are stable at 284.    Goal of Therapy:  Heparin level 0.3-0.7 units/ml Monitor platelets by anticoagulation protocol: Yes   Plan:  Continue heparin at 2000  units/hour Check 8 hour confirmatory heparin level  Daily heparin level and cbc Follow hematuria and other signs of bleeding    Thank you,   Eddie Candle, PharmD PGY-1 Pharmacy Resident   Please check amion for clinical pharmacist contact number

## 2019-04-01 NOTE — Progress Notes (Signed)
Patient placed on bipap for QHS order and is tolerating well at this time.

## 2019-04-01 NOTE — TOC Progression Note (Signed)
Transition of Care [facility]) - Progression Note    Patient Details  Name: [name] MRN: [id number] Date of Birth: [birth year]  Transition of Care [facility]) CM/SW [place], [place] Phone Number: 04/01/2019, 10:48 AM  Clinical Narrative:    [facility] team continues to follow, pt is LTC resident at [facility]. Plan to return, will need new COVID prior to return to SNF.   Expected Discharge Plan: [place] Barriers to Discharge: Continued Medical Work up  Expected Discharge Plan and Services Expected Discharge Plan: [place] In-house Referral: Clinical Social Work Discharge Planning Services: NA Post Acute Care Choice: [place] Living arrangements for the past 2 months: [place]                 DME Arranged: N/A DME Agency: NA HH Arranged: NA HH Agency: NA   Social Determinants of Health (SDOH) Interventions    Readmission Risk Interventions No flowsheet data found.

## 2019-04-01 NOTE — Plan of Care (Signed)

## 2019-04-01 NOTE — Progress Notes (Signed)
ANTICOAGULATION CONSULT NOTE  Pharmacy Consult:  Heparin Indication: atrial fibrillation  Allergies  Allergen Reactions  . Nsaids Other (See Comments)    Stomach pain  . Morphine And Related Nausea And Vomiting and Other (See Comments)    Dizziness, light-headedness and "Opiates cause tightness in chest"  . Vicodin [Hydrocodone-Acetaminophen] Anxiety    Patient Measurements: Height: 5\' 10"  (177.8 cm) Weight: 213 lb (96.6 kg) IBW/kg (Calculated) : 73 Heparin Dosing Weight: 92.3kg   Vital Signs: Temp: 98.5 F (36.9 C) (11/15 1143) Temp Source: Oral (11/15 1143) BP: 103/46 (11/15 1143) Pulse Rate: 60 (11/15 1143)  Labs: Recent Labs    03/30/19 0451 03/31/19 0521 03/31/19 2229 04/01/19 0731 04/01/19 1522  HGB 9.5* 8.8*  --  9.0*  --   HCT 31.0* 29.4*  --  28.4*  --   PLT 313 287  --  284  --   HEPARINUNFRC 0.36 <0.10* <0.10* 0.33 0.31  CREATININE 0.55* 0.61  --   --   --     Estimated Creatinine Clearance: 73 mL/min (by C-G formula based on SCr of 0.61 mg/dL).   Assessment: 63 yoM admitted 11/4 with perinephric fluid collection requiring nephrostomy tube. Pt on Xarelto PTA for hx AFib (CHADSVASc = 3) which has been held with need for procedures and bridged with IV heparin. Last dose of Xarelto was 11/4 am. Patient is s/p catheter placement for abscess drainage. Heparin resumed 11/11 then put on hold 11/13 at about 1000 d/t hematuria. Now pharmacy re-consulted to resume heparin infusion without a bolus.   Heparin held and then resumed yesterday. Now remains therapeutic at 0.31 on recheck. Will increase slightly to prevent subtherapeutic drop.  Goal of Therapy:  Heparin level 0.3-0.7 units/ml Monitor platelets by anticoagulation protocol: Yes   Plan:  Increase heparin to 2050 units/hour Daily heparin level and CBC   Arrie Senate, PharmD, BCPS Clinical Pharmacist [phone] Please check AMION for all [place] numbers 04/01/2019

## 2019-04-02 ENCOUNTER — Inpatient Hospital Stay (HOSPITAL_COMMUNITY): Payer: Medicare Other

## 2019-04-02 ENCOUNTER — Encounter (HOSPITAL_COMMUNITY): Payer: Self-pay | Admitting: Interventional Radiology

## 2019-04-02 HISTORY — PX: IR SINUS/FIST TUBE CHK-NON GI: IMG673

## 2019-04-02 HISTORY — PX: IR NEPHROSTOMY PLACEMENT LEFT: IMG6063

## 2019-04-02 LAB — HEPARIN LEVEL (UNFRACTIONATED): Heparin Unfractionated: 0.51 IU/mL (ref 0.30–0.70)

## 2019-04-02 LAB — CBC
HCT: 29.3 % — ABNORMAL LOW (ref 39.0–52.0)
Hemoglobin: 9.2 g/dL — ABNORMAL LOW (ref 13.0–17.0)
MCH: 31.5 pg (ref 26.0–34.0)
MCHC: 31.4 g/dL (ref 30.0–36.0)
MCV: 100.3 fL — ABNORMAL HIGH (ref 80.0–100.0)
Platelets: 315 10*3/uL (ref 150–400)
RBC: 2.92 MIL/uL — ABNORMAL LOW (ref 4.22–5.81)
RDW: 14.9 % (ref 11.5–15.5)
WBC: 7.7 10*3/uL (ref 4.0–10.5)
nRBC: 0 % (ref 0.0–0.2)

## 2019-04-02 LAB — AEROBIC/ANAEROBIC CULTURE W GRAM STAIN (SURGICAL/DEEP WOUND): Special Requests: NORMAL

## 2019-04-02 MED ORDER — LIDOCAINE-EPINEPHRINE 2 %-1:100000 IJ SOLN
INTRAMUSCULAR | Status: AC | PRN
  Administered 2019-04-02: 5 mL

## 2019-04-02 MED ORDER — IOHEXOL 300 MG/ML  SOLN
50.0000 mL | Freq: Once | INTRAMUSCULAR | Status: AC | PRN
  Administered 2019-04-02: 20 mL

## 2019-04-02 MED ORDER — LIDOCAINE HCL 1 % IJ SOLN
INTRAMUSCULAR | Status: AC | PRN
  Administered 2019-04-02: 10 mL

## 2019-04-02 MED ORDER — LIDOCAINE-EPINEPHRINE (PF) 1 %-1:200000 IJ SOLN
INTRAMUSCULAR | Status: AC
  Filled 2019-04-02: qty 30

## 2019-04-02 MED ORDER — IOHEXOL 300 MG/ML  SOLN
100.0000 mL | Freq: Once | INTRAMUSCULAR | Status: AC | PRN
  Administered 2019-04-02: 100 mL via INTRAVENOUS

## 2019-04-02 MED ORDER — LIDOCAINE HCL 1 % IJ SOLN
INTRAMUSCULAR | Status: AC
  Filled 2019-04-02: qty 20

## 2019-04-02 MED ORDER — MIDAZOLAM HCL 2 MG/2ML IJ SOLN
INTRAMUSCULAR | Status: AC
  Filled 2019-04-02: qty 2

## 2019-04-02 MED ORDER — FENTANYL CITRATE (PF) 100 MCG/2ML IJ SOLN
INTRAMUSCULAR | Status: AC | PRN
  Administered 2019-04-02: 12.5 ug via INTRAVENOUS
  Administered 2019-04-02: 25 ug via INTRAVENOUS
  Administered 2019-04-02: 12.5 ug via INTRAVENOUS

## 2019-04-02 MED ORDER — FENTANYL CITRATE (PF) 100 MCG/2ML IJ SOLN
INTRAMUSCULAR | Status: AC
  Filled 2019-04-02: qty 2

## 2019-04-02 NOTE — Progress Notes (Signed)
ANTICOAGULATION CONSULT NOTE  Pharmacy Consult:  Heparin Indication: atrial fibrillation  Allergies  Allergen Reactions  . Nsaids Other (See Comments)    Stomach pain  . Morphine And Related Nausea And Vomiting and Other (See Comments)    Dizziness, light-headedness and "Opiates cause tightness in chest"  . Vicodin [Hydrocodone-Acetaminophen] Anxiety    Patient Measurements: Height: 5\' 10"  (177.8 cm) Weight: 220 lb 14.4 oz (100.2 kg) IBW/kg (Calculated) : 73 Heparin Dosing Weight: 92.3kg   Vital Signs: Temp: 98.7 F (37.1 C) (11/16 0559) Temp Source: Oral (11/16 0559) BP: 123/58 (11/16 0559) Pulse Rate: 70 (11/16 0559)  Labs: Recent Labs    03/31/19 0521  04/01/19 0731 04/01/19 1522 04/02/19 0551  HGB 8.8*  --  9.0*  --  9.2*  HCT 29.4*  --  28.4*  --  29.3*  PLT 287  --  284  --  315  HEPARINUNFRC <0.10*   < > 0.33 0.31 0.51  CREATININE 0.61  --   --   --   --    < > = values in this interval not displayed.    Estimated Creatinine Clearance: 74.3 mL/min (by C-G formula based on SCr of 0.61 mg/dL).   Assessment: 9 yoM admitted 11/4 with perinephric fluid collection requiring nephrostomy tube. Pt on Xarelto PTA for hx AFib (CHADSVASc = 3) which has been held with need for procedures and bridged with IV heparin. Last dose of Xarelto was 11/4 am. Patient is s/p catheter placement for abscess drainage. Heparin resumed 11/11 then put on hold 11/13 at about 1000 d/t hematuria. Pharmacy re-consulted to resume heparin.  Heparin level at goal this morning. No further hematuria noted, will continue to follow. Will make small rate adjustment to keep at lower end of goal. CBC has remained stable.   Goal of Therapy:  Heparin level 0.3-0.7 units/ml Monitor platelets by anticoagulation protocol: Yes   Plan:  Continue heparin at 2000 units/hour Daily heparin level and CBC  Erin Hearing PharmD., BCPS Clinical Pharmacist 04/02/2019 9:21 AM

## 2019-04-02 NOTE — Procedures (Signed)
Pre Procedure Dx: Hydronephrosis; Perinephric abscesses Post Procedural Dx: Same  Successful [place] and fluoroscopic guided placement of a left sided PCN with end coiled and locked within the renal pelvis. PCN connected to gravity bag.  Technically successful CT guided placed of a 10 Fr drainage catheter placement x2 into two separate perinephric abscess yielding 10 cc and 5 cc of bloody purulent fluid respectively. Both perinephric drains placed to JP bulbs.  EBL: Minimal  Complications: None immediate  Ronny Bacon, MD Pager #: [phone]

## 2019-04-02 NOTE — Progress Notes (Signed)
Referring Physician(s): Dr. Antionette Char  Supervising Physician: Simonne Come  Patient Status:  [facility] - In-pt  Chief Complaint: Left hydronephrosis Perinephric fluid collection  Subjective: Patient resting.  Eyes closed but seems to hear most of our conversation. Daughter at bedside updated on CT results and care plan changes.  Patient in and out during conversation, ultimately says "let's get it done" when asked about drain replacements.   Allergies: Nsaids, Morphine and related, and Vicodin [hydrocodone-acetaminophen]  Medications: Prior to Admission medications   Medication Sig Start Date End Date Taking? Authorizing Provider  acetaminophen (TYLENOL) 500 MG tablet Take 2 tablets (1,000 mg total) by mouth every 8 (eight) hours. Patient taking differently: Take 1,000 mg by mouth See admin instructions. Take 1,000 mg by mouth every eight hours and do not exceed 4,000 mg/24 hours from all combined sources 01/24/17  Yes Babish, Molli Hazard, [name]  albuterol (PROVENTIL HFA;VENTOLIN HFA) 108 (90 Base) MCG/ACT inhaler Inhale 2 puffs into the lungs every 4 (four) hours as needed for wheezing or shortness of breath.    Yes [provider]  Amino Acids-Protein Hydrolys (FEEDING SUPPLEMENT, PRO-STAT SUGAR FREE 64,) LIQD Take 30 mLs by mouth 3 (three) times daily.    Yes [provider]  amiodarone (PACERONE) 200 MG tablet TAKE ONE TABLET BY MOUTH ONCE DAILY Patient taking differently: Take 200 mg by mouth daily.  10/29/16  Yes Hilty, Lisette Abu, MD  Artificial Saliva (BIOTENE DRY MOUTH MOISTURIZING) SOLN 1 spray by Mouth Rinse route every 2 (two) hours as needed (for dryness).   Yes [provider]  bumetanide (BUMEX) 1 MG tablet Take 1 tablet (1 mg total) by mouth 2 (two) times daily. 10/15/16  Yes Hilty, Lisette Abu, MD  cetirizine (ZYRTEC) 10 MG tablet Take 10 mg by mouth daily.    Yes [provider]  Cholecalciferol (VITAMIN D-3) 25 MCG (1000 UT) CAPS Take 1,000 Units by  mouth daily.   Yes [provider]  docusate sodium 100 MG CAPS Take 100 mg by mouth 2 (two) times daily. 08/15/13  Yes Babish, Molli Hazard, [name]  famotidine (PEPCID) 20 MG tablet Take 1 tablet (20 mg total) by mouth 2 (two) times daily. 02/21/17  Yes Tyrone Nine, MD  ferrous sulfate (FERROUSUL) 325 (65 FE) MG tablet Take 1 tablet (325 mg total) by mouth 3 (three) times daily with meals. Patient taking differently: Take 325 mg by mouth 2 (two) times daily with a meal.  01/24/17  Yes Babish, Molli Hazard, [name]  gabapentin (NEURONTIN) 400 MG capsule TAKE 1 CAPSULE BY MOUTH THREE TIMES DAILY Patient taking differently: Take 400 mg by mouth 3 (three) times daily.  01/14/17  Yes [name], MD  lactulose [facility]) 10 GM/15ML solution Take 20 g by mouth daily. FOR 5 DAYS 03/19/19 03/24/19 Yes [provider]  metoCLOPramide (REGLAN) 5 MG tablet Take 2.5 mg by mouth 2 (two) times daily. FOR 7 DAYS 03/19/19 03/26/19 Yes [provider]  midodrine (PROAMATINE) 10 MG tablet Take 1 tablet (10 mg total) by mouth 3 (three) times daily. 05/28/17  Yes Tyrone Nine, MD  Multiple Vitamins-Minerals (CERTAGEN PO) Take 1 tablet by mouth daily.    Yes [provider]  Multiple Vitamins-Minerals (PRESERVISION AREDS 2) CAPS Take 1 capsule by mouth 2 (two) times daily.   Yes [provider]  nutrition supplement, JUVEN, (JUVEN) PACK Take 1 packet by mouth 2 (two) times daily between meals. 02/21/17  Yes Tyrone Nine, MD  Nutritional Supplements (RESOURCE  2.0) LIQD Take 120 mLs by mouth at bedtime.   Yes [provider]  OXYGEN Inhale 2 L/min into the lungs as needed (for shortness of breath).    Yes [provider]  polyethylene glycol (MIRALAX / GLYCOLAX) packet Take 17 g by mouth 2 (two) times daily. Patient taking differently: Take 17 g by mouth See admin instructions. Mix 17 grams into 4-8 ounces of liquid and drink two times a day 01/24/17  Yes Babish, Rodman Key,  [name]  potassium chloride (KLOR-CON) 20 MEQ packet Take 20 mEq by mouth daily.   Yes [provider]  PRESCRIPTION MEDICATION BiPAP: At bedtime   Yes [provider]  rivaroxaban (XARELTO) 20 MG TABS tablet Take 1 tablet (20 mg total) by mouth daily. 08/05/15  Yes Hilty, Nadean Corwin, MD  rosuvastatin (CRESTOR) 20 MG tablet TAKE ONE TABLET BY MOUTH ONCE A WEEK, FRIDAY Patient taking differently: Take 20 mg by mouth every Friday.  12/24/16  Yes Hilty, Nadean Corwin, MD  simethicone (MYLICON) 403 MG chewable tablet Chew 125 mg by mouth 3 (three) times daily. FOR 5 DAYS 03/23/2019 03/26/19 Yes [provider]  SPIRIVA HANDIHALER 18 MCG inhalation capsule INHALE ONE PUFF BY MOUTH ONCE DAILY Patient taking differently: Place 18 mcg into inhaler and inhale daily.  03/18/16  Yes Marletta Lor, MD  tamsulosin (FLOMAX) 0.4 MG CAPS capsule TAKE 1 CAPSULE BY MOUTH ONCE DAILY Patient taking differently: Take 0.4 mg by mouth daily.  12/24/16  Yes Marletta Lor, MD  traMADol (ULTRAM) 50 MG tablet Take 1-2 tablets (50-100 mg total) by mouth every 12 (twelve) hours as needed for moderate pain or severe pain (and before hydrotherapy). Patient taking differently: Take 50 mg by mouth at bedtime.  05/28/17  Yes Patrecia Pour, MD  Magnesium 250 MG TABS Take 250 mg by mouth daily.     [provider]  Melatonin 3 MG CAPS Take 3 mg by mouth as needed (insomnia).    [provider]  Multiple Vitamin (MULTIVITAMIN WITH MINERALS) TABS tablet Take 1 tablet by mouth daily.    [provider]  protein supplement shake (PREMIER PROTEIN) LIQD Take 325 mLs (11 oz total) by mouth daily. Patient not taking: Reported on 04/11/2019 02/21/17   Patrecia Pour, MD     Vital Signs: BP (!) 123/58 (BP Location: Left Arm)   Pulse 70   Temp 98.7 F (37.1 C) (Oral)   Resp 20   Ht 5\' 10"  (1.778 m)   Wt 220 lb 14.4 oz (100.2 kg)   SpO2 93%   BMI 31.70 kg/m   Physical Exam  NAD,  alert Abdomen: soft, non-tender, non-distended.   Back:  3 drains in place with scant output.  Insertion sites intact. GU: bloody urine in collection bag.   Imaging: No results found.  Labs:  CBC: Recent Labs    03/30/19 0451 03/31/19 0521 04/01/19 0731 04/02/19 0551  WBC 9.2 8.7 7.5 7.7  HGB 9.5* 8.8* 9.0* 9.2*  HCT 31.0* 29.4* 28.4* 29.3*  PLT 313 287 284 315    COAGS: Recent Labs    03/22/19 0759 03/24/19 1241  INR 1.1  --   APTT  --  36    BMP: Recent Labs    03/28/19 0336 03/29/19 1027 03/30/19 0451 03/31/19 0521  NA 140 141 139 139  K 3.8 3.8 4.3 4.4  CL 97* 98 96* 93*  CO2 36* 34* 37* 37*  GLUCOSE 103* 118* 108* 107*  BUN 18 12 19 21   CALCIUM 8.9 8.9 9.0 9.3  CREATININE 0.58* 0.54* 0.55* 0.61  GFRNONAA >60 >60 >60 >60  GFRAA >60 >60 >60 >60    LIVER FUNCTION TESTS: Recent Labs    03/20/2019 1749  BILITOT 0.4  AST 19  ALT 15  ALKPHOS 64  PROT 5.6*  ALBUMIN 2.0*    Assessment and Plan: Left hydronephrosis s/p percutaneous nephrostomy tube placement 03/22/19 by Dr. 13/5/20 s/p drain placement by Dr. Phillips Odor 03/22/19 Second perinephric fluid collection drain placement by Dr. 13/5/20 03/28/19 Dr. 13/11/20 was contacted earlier today for drain evaluation as his output had become minimal.   CT Abdomen Pelvis shows retraction of all drain with persisting fluid collections.  Dr. Grace Isaac has discussed with primary team/Urology and recommends proceeding with replacement of all drains.   His vital signs have been stable; WBC improved to 7.7, afebrile.  Daughter, Grace Isaac, and patient updated at bedside.  Heparin stopped at 1130.  He has bloody urine in his foley on exam today. Per RN, this had resolved over the weekend, now returned with resume of Heparin.   Ate breakfast this AM.   Orders placed for drain replacement today.  Patient does have OSA.  Wore CPAP overnight. Attempt procedure with sedation as able this afternoon.   Risks and  benefits of left PCN placement was discussed with the patient including, but not limited to, infection, bleeding, significant bleeding causing loss or decrease in renal function or damage to adjacent structures.   All of the patient's questions were answered, patient is agreeable to proceed.  Consent signed and in chart.     Electronically Signed: [name], PA 04/02/2019, 12:32 PM   I spent a total of 15 Minutes at the the patient's bedside AND on the patient's hospital floor or unit, greater than 50% of which was counseling/coordinating care for left hydronephrosis, urinoma.

## 2019-04-02 NOTE — Sedation Documentation (Signed)
Pt only given fentanyl not sedated

## 2019-04-02 NOTE — Plan of Care (Signed)

## 2019-04-02 NOTE — Progress Notes (Signed)
Pt placed on Bipap.  Pt tolerating well.

## 2019-04-02 NOTE — Progress Notes (Signed)
PROGRESS NOTE    Marguette Condry Scarpati  TMH:[id number] DOB: 1926/08/28 DOA: 2019-03-22 PCP: System, Pcp Not In    Brief Narrative:  85 y.o.malewith medical history significant foratrial fibrillation on Xarelto, chronic diastolic CHF, asthma/COPD, OSA on BiPAP, hypotension on midodrine, and chronic sacral ulcer, brought to the emergency department for evaluation of progressive abdominal distention with alternating constipation and watery stool.  Found to have perinephric fluid collection requiring nephrostomy tube. Patient is from long-term nursing home.  He used to walk with a walker and not done so for at least 6 months now.  Mostly wheelchair-bound.   Assessment & Plan:   Principal Problem:   Perinephric fluid collection Active Problems:   Asthma   Paroxysmal Atrial fibrillation - admitted with RVR   Chronic diastolic CHF (congestive heart failure) (HCC)   AF (paroxysmal atrial fibrillation) (HCC)   Hypokalemia   Ileus (HCC)   Closed compression fracture of L4 vertebra (HCC)   Sacral pressure ulcer   Renal cyst, left  Abdominal pain secondary to perinephric fluid collection/left-sided hydronephrosis with urinoma/complicated UTI: 03/22/2019, superior pole nephrostomy tube placed 03/28/2019, inferior pole nephrostomy tube placed due to collection not drained Urine cultures with Proteus sensitive to ceftriaxone.  Blood cultures negative.  We will continue ceftriaxone until patient has indwelling percutaneous tube.  Now with minimal drain. Will discuss with nephrology to remove the tubes. Hopefully drain is ready to come out.   Hematuria: Patient had gross hematuria on heparin. Held for 24 hours and challenged with heparin. Urine clearing up with no more frank hematuria.  Continue on heparin.  Will change to Xarelto when the tubes are out.  Chronic atrial fibrillation: Rate controlled.  On amiodarone.  in sinus rhythm.  Was on Xarelto prior to admission that is on hold pending clinical  improvement.  Now on heparin.  Hypotension: Blood pressures fairly stable.  Patient on midodrine that he will continue.  Chronic sacral pressure ulcer stage IV present on admission: Followed by wound care.  Deconditioning/long-term nursing home resident: Work with PT OT.  Unsure whether he is rehab candidate.  Needs to go back to skilled nursing facility.  COVID-19 test today in anticipation of discharge to NH   D/w IR Dr Grace Isaac . CT abdomen pelvis with IV contrast today, NPO, possible injection and drain removal and ureteric stent placement today.   DVT prophylaxis: Heparin infusion Code Status: Partial Family Communication: none  Disposition Plan: Back to nursing home when stable.  Anticipate next 24 to 48 hours.   Consultants:   Urology  Interventional radiology  Procedures:   Percutaneous drain, 11/5, 11/11 left kidney  Antimicrobials:   Rocephin, 03/22/2019---   Subjective: Seen and examined.  No overnight events. Sinus rhythm. No more hematuria. Eating breakfast.   Objective: Vitals:   04/01/19 0758 04/01/19 1143 04/01/19 2014 04/02/19 0559  BP:  (!) 103/46 123/65 (!) 123/58  Pulse:  60 70 70  Resp:  18 18 20   Temp:  98.5 F (36.9 C) 98.9 F (37.2 C) 98.7 F (37.1 C)  TempSrc:  Oral Oral Oral  SpO2: 95% 96% 95% 91%  Weight:    100.2 kg  Height:        Intake/Output Summary (Last 24 hours) at 04/02/2019 0812 Last data filed at 04/02/2019 0600 Gross per 24 hour  Intake 1050 ml  Output 1445 ml  Net -395 ml   Filed Weights   03/31/19 0331 04/01/19 0500 04/02/19 0559  Weight: 99.5 kg 96.6 kg 100.2 kg  Examination:  General exam: Appears calm and comfortable, chronically sick looking.  Not in any obvious distress. Respiratory system: Clear to auscultation. Respiratory effort normal.  On 2 L oxygen. Cardiovascular system: S1 & S2 heard, RRR. No JVD, murmurs, rubs, gallops or clicks.  Non-pedal bilateral edema. Gastrointestinal system: Abdomen is  nondistended, soft and nontender. No organomegaly or masses felt. Normal bowel sounds heard. Both nephrostomy tube with minimal clear urine.   Condom catheter with clear urine. Central nervous system: Alert and oriented. No focal neurological deficits. Extremities: Symmetric 5 x 5 power.  He is profoundly weak on all extremities mostly lower extremities. Skin: Stage IV sacral decubitus present. Psychiatry: Judgement and insight appear normal. Mood & affect anxious.    Data Reviewed: I have personally reviewed following labs and imaging studies  CBC: Recent Labs  Lab 03/29/19 0500 03/30/19 0451 03/31/19 0521 04/01/19 0731 04/02/19 0551  WBC 6.8 9.2 8.7 7.5 7.7  HGB 9.1* 9.5* 8.8* 9.0* 9.2*  HCT 29.7* 31.0* 29.4* 28.4* 29.3*  MCV 102.4* 103.7* 103.2* 100.7* 100.3*  PLT 330 313 287 284 315   Basic Metabolic Panel: Recent Labs  Lab 03/27/19 0405 03/28/19 0336 03/29/19 1027 03/30/19 0451 03/31/19 0521  NA 139 140 141 139 139  K 3.9 3.8 3.8 4.3 4.4  CL 96* 97* 98 96* 93*  CO2 36* 36* 34* 37* 37*  GLUCOSE 105* 103* 118* 108* 107*  BUN 19 18 12 19 21   CREATININE 0.56* 0.58* 0.54* 0.55* 0.61  CALCIUM 9.0 8.9 8.9 9.0 9.3   GFR: Estimated Creatinine Clearance: 74.3 mL/min (by C-G formula based on SCr of 0.61 mg/dL). Liver Function Tests: No results for input(s): AST, ALT, ALKPHOS, BILITOT, PROT, ALBUMIN in the last 168 hours. No results for input(s): LIPASE, AMYLASE in the last 168 hours. No results for input(s): AMMONIA in the last 168 hours. Coagulation Profile: No results for input(s): INR, PROTIME in the last 168 hours. Cardiac Enzymes: No results for input(s): CKTOTAL, CKMB, CKMBINDEX, TROPONINI in the last 168 hours. BNP (last 3 results) No results for input(s): PROBNP in the last 8760 hours. HbA1C: No results for input(s): HGBA1C in the last 72 hours. CBG: No results for input(s): GLUCAP in the last 168 hours. Lipid Profile: No results for input(s): CHOL, HDL,  LDLCALC, TRIG, CHOLHDL, LDLDIRECT in the last 72 hours. Thyroid Function Tests: No results for input(s): TSH, T4TOTAL, FREET4, T3FREE, THYROIDAB in the last 72 hours. Anemia Panel: No results for input(s): VITAMINB12, FOLATE, FERRITIN, TIBC, IRON, RETICCTPCT in the last 72 hours. Sepsis Labs: No results for input(s): PROCALCITON, LATICACIDVEN in the last 168 hours.  Recent Results (from the past 240 hour(s))  Aerobic/Anaerobic Culture (surgical/deep wound)     Status: None (Preliminary result)   Collection Time: 03/28/19  4:19 PM   Specimen: Abscess  Result Value Ref Range Status   Specimen Description ABSCESS PERINEPHRIC DRAIN  Final   Special Requests Normal  Final   Gram Stain   Final    RARE WBC PRESENT,BOTH PMN AND MONONUCLEAR NO ORGANISMS SEEN Performed at [facility], [address] [place], [place]    Culture   Final    RARE PROTEUS MIRABILIS NO ANAEROBES ISOLATED; CULTURE IN PROGRESS FOR 5 DAYS    Report Status PENDING  Incomplete   Organism ID, Bacteria PROTEUS MIRABILIS  Final      Susceptibility   Proteus mirabilis - MIC*    AMPICILLIN >=32 RESISTANT Resistant     CEFAZOLIN 16 SENSITIVE  Sensitive     CEFEPIME <=1 SENSITIVE Sensitive     CEFTAZIDIME <=1 SENSITIVE Sensitive     CEFTRIAXONE <=1 SENSITIVE Sensitive     CIPROFLOXACIN >=4 RESISTANT Resistant     GENTAMICIN <=1 SENSITIVE Sensitive     IMIPENEM 2 SENSITIVE Sensitive     TRIMETH/SULFA >=320 RESISTANT Resistant     AMPICILLIN/SULBACTAM >=32 RESISTANT Resistant     PIP/TAZO <=4 SENSITIVE Sensitive     * RARE PROTEUS MIRABILIS  Culture, Urine     Status: None   Collection Time: 03/30/19  6:54 AM   Specimen: Urine, Random  Result Value Ref Range Status   Specimen Description URINE, RANDOM BAG,BED  Final   Special Requests NONE  Final   Culture   Final    NO GROWTH Performed at [facility], [place] [address], [place], [place] [address]    Report Status 03/31/2019 FINAL  Final   SARS CORONAVIRUS 2 (TAT 6-24 HRS) Nasopharyngeal Nasopharyngeal Swab     Status: None   Collection Time: 04/01/19 11:55 AM   Specimen: Nasopharyngeal Swab  Result Value Ref Range Status   SARS Coronavirus 2 NEGATIVE NEGATIVE Final    Comment: (NOTE) SARS-CoV-2 target nucleic acids are NOT DETECTED. The SARS-CoV-2 RNA is generally detectable in upper and lower respiratory specimens during the acute phase of infection. Negative results do not preclude SARS-CoV-2 infection, do not rule out co-infections with other pathogens, and should not be used as the sole basis for treatment or other patient management decisions. Negative results must be combined with clinical observations, patient history, and epidemiological information. The expected result is Negative. Fact Sheet for Patients: SugarRoll.be Fact Sheet for Healthcare Providers: https://www.woods-mathews.com/ This test is not yet approved or cleared by the [place] FDA and  has been authorized for detection and/or diagnosis of SARS-CoV-2 by FDA under an Emergency Use Authorization (EUA). This EUA will remain  in effect (meaning this test can be used) for the duration of the COVID-19 declaration under Section 56 4(b)(1) of the Act, 21 U.S.C. section 360bbb-3(b)(1), unless the authorization is terminated or revoked sooner. Performed at [facility], [place] [address] [place], [place] [address]          Radiology Studies: No results found.      Scheduled Meds: . amiodarone  200 mg Oral Daily  . famotidine  20 mg Oral BID  . feeding supplement (PRO-STAT SUGAR FREE 64)  30 mL Oral BID WC  . gabapentin  400 mg Oral TID  . midodrine  10 mg Oral TID WC  . multivitamin with minerals  1 tablet Oral Daily  . nutrition supplement (JUVEN)  1 packet Oral BID BM  . potassium chloride  20 mEq Oral Daily  . Ensure Max Protein  11 oz Oral QHS  . sodium chloride flush  3 mL  Intravenous Q12H  . umeclidinium bromide  1 puff Inhalation Daily   Continuous Infusions: . cefTRIAXone (ROCEPHIN)  IV 2 g (04/01/19 1644)  . heparin 2,050 Units/hr (04/02/19 0011)     LOS: 11 days    Time spent: 25 minutes    Barb Merino, MD [facility] Pager [phone]

## 2019-04-03 ENCOUNTER — Encounter (HOSPITAL_COMMUNITY): Payer: Self-pay

## 2019-04-03 LAB — CBC
HCT: 28.6 % — ABNORMAL LOW (ref 39.0–52.0)
Hemoglobin: 8.7 g/dL — ABNORMAL LOW (ref 13.0–17.0)
MCH: 31.2 pg (ref 26.0–34.0)
MCHC: 30.4 g/dL (ref 30.0–36.0)
MCV: 102.5 fL — ABNORMAL HIGH (ref 80.0–100.0)
Platelets: 317 10*3/uL (ref 150–400)
RBC: 2.79 MIL/uL — ABNORMAL LOW (ref 4.22–5.81)
RDW: 15.3 % (ref 11.5–15.5)
WBC: 7.1 10*3/uL (ref 4.0–10.5)
nRBC: 0 % (ref 0.0–0.2)

## 2019-04-03 LAB — HEPARIN LEVEL (UNFRACTIONATED): Heparin Unfractionated: 0.28 IU/mL — ABNORMAL LOW (ref 0.30–0.70)

## 2019-04-03 MED ORDER — SODIUM CHLORIDE 0.9% FLUSH
5.0000 mL | Freq: Three times a day (TID) | INTRAVENOUS | Status: DC
  Administered 2019-04-03 – 2019-04-13 (×27): 5 mL

## 2019-04-03 NOTE — Progress Notes (Signed)
ANTICOAGULATION CONSULT NOTE  Pharmacy Consult:  Heparin Indication: atrial fibrillation  Allergies  Allergen Reactions  . Nsaids Other (See Comments)    Stomach pain  . Morphine And Related Nausea And Vomiting and Other (See Comments)    Dizziness, light-headedness and "Opiates cause tightness in chest"  . Vicodin [Hydrocodone-Acetaminophen] Anxiety    Patient Measurements: Height: 5\' 10"  (177.8 cm) Weight: 221 lb (100.2 kg) IBW/kg (Calculated) : 73 Heparin Dosing Weight: 92.3kg   Vital Signs: Temp: 98.4 F (36.9 C) (11/17 0411) Temp Source: Oral (11/17 0411) BP: 101/43 (11/17 0411) Pulse Rate: 69 (11/17 0411)  Labs: Recent Labs    04/01/19 0731 04/01/19 1522 04/02/19 0551 04/03/19 0351  HGB 9.0*  --  9.2* 8.7*  HCT 28.4*  --  29.3* 28.6*  PLT 284  --  315 317  HEPARINUNFRC 0.33 0.31 0.51 0.28*    Estimated Creatinine Clearance: 74.3 mL/min (by C-G formula based on SCr of 0.61 mg/dL).   Assessment: 85 yoM admitted 11/4 with perinephric fluid collection requiring nephrostomy tube. Pt on Xarelto PTA for hx AFib (CHADSVASc = 3) which has been held with need for procedures and bridged with IV heparin. Last dose of Xarelto was 11/4 am. Held fo drain placement 11/11. Heparin resumed 11/11 then put on hold 11/13 at about 1000 d/t hematuria. Resumed 11/14  Pt s/p IR 11/16 afternoon for perinephric abscess drains placed. Heparin was off in IR for about 5 hours. Heparin restarted at 2030 at 2000 units/hr by RN per MD order.  Heparin level slightly subtherapeutic (0.28) on gtt at 2000 units/hr - noted pt was therapeutic on this dose pre-IR x 3 levels.  Goal of Therapy:  Heparin level 0.3-0.7 units/ml Monitor platelets by anticoagulation protocol: Yes   Plan:  Increase heparin slightly to 2050 units/hour Will f/u 8 hr heparin level  Sherlon Handing, PharmD, BCPS 04/03/2019 5:34 AM

## 2019-04-03 NOTE — Care Management Important Message (Signed)
Important Message  Patient Details  Name: [name] MRN: [id number] Date of Birth: Feb 11, 1927   Medicare Important Message Given:  Yes     Shelda Altes 04/03/2019, 1:34 PM

## 2019-04-03 NOTE — Progress Notes (Signed)
Physical Therapy Treatment Patient Details Name: [name] MRN: [id number] DOB: 01-27-1927 Today's Date: 04/03/2019    History of Present Illness 85 y.o. male with medical history significant for atrial fibrillation on Xarelto, chronic diastolic CHF, asthma/COPD, OSA on BiPAP, hypotension on midodrine, and chronic sacral ulcer, brought to the emergency department for evaluation of progressive abdominal distention with alternating constipation and watery stool.  Found to have perinephric fluid collection requiring nephrostomy tube placement 11/5, 11/11, 11/16.    PT Comments    Pt in bed upon PT arrival, agreeable to PT session. Pt continues to demo limitations in functional mobility requiring maxA for bed mobility and min-maxA with BUE support to sit EOB for prolonged periods of time. Pt was able to initiate movements with BUE and BLE for bed mobility, but still benefits from assist of 2 to complete transition to sitting EOB. Pt was then able to maintain sitting EOB for ~12 min, but fluctuated between min guard and maxA to maintain seated position, often requiring support for post and right lean. Pt initially able to correct posture with verbal and tactile cues, fatigues quickly and was then unable to correct without assist. Pt will continue to benefit from skilled PT to maximize rehab potential and improve independence with functional mobility.    Follow Up Recommendations  SNF;Supervision/Assistance - 24 hour     Equipment Recommendations  None recommended by PT    Recommendations for Other Services       Precautions / Restrictions Precautions Precautions: Fall Precaution Comments: multiple JP drains on L post abdomen Restrictions Weight Bearing Restrictions: No    Mobility  Bed Mobility Overal bed mobility: Needs Assistance Bed Mobility: Rolling;Sidelying to Sit;Sit to Supine Rolling: Max assist Sidelying to sit: Max assist;HOB elevated;+2 for physical assistance   Sit  to supine: Max assist;HOB elevated;+2 for physical assistance   General bed mobility comments: Pt able to initiate BLE movement and assist with BUE using bed rails, but pt still benefits from modA for BLE movement and maxA of+2 for elevation of trunk from bed to achieve full sitting EOB, modA for scooting to reposition at EOB and to maintain upright posture.  Transfers                 General transfer comment: unable  Ambulation/Gait             General Gait Details: unable   Stairs             Wheelchair Mobility    Modified Rankin (Stroke Patients Only)       Balance Overall balance assessment: Needs assistance Sitting-balance support: Bilateral upper extremity supported;Feet supported Sitting balance-Leahy Scale: Poor Sitting balance - Comments: modA initially due to R and post lean with BUE support, pt able to remove and use single UE support with minA, progressed to min guard at times sitting EOB, but fatigues quickly and requires post support (sat EOB ~12 min) Postural control: Right lateral lean;Posterior lean   Standing balance-Leahy Scale: Zero Standing balance comment: unable, pt reports 6 mo since last stood                            Cognition Arousal/Alertness: Awake/alert Behavior During Therapy: WFL for tasks assessed/performed Overall Cognitive Status: Within Functional Limits for tasks assessed  Exercises      General Comments General comments (skin integrity, edema, etc.): 2L [place] through session      Pertinent Vitals/Pain Pain Assessment: Faces Faces Pain Scale: Hurts a little bit Pain Location: back with mobility Pain Descriptors / Indicators: Aching Pain Intervention(s): Limited activity within patient's tolerance;Monitored during session;Repositioned    Home Living                      Prior Function            PT Goals (current goals can now be  found in the care plan section) Acute Rehab PT Goals Patient Stated Goal: To sit up by himself PT Goal Formulation: With patient Time For Goal Achievement: 03/30/2019 Potential to Achieve Goals: Fair Additional Goals Additional Goal #1: Pt will mobilize in [facility] for 50' with modA, utilizing BUE. Progress towards PT goals: Progressing toward goals    Frequency    Min 2X/week      PT Plan Current plan remains appropriate    Co-evaluation              AM-PAC PT "6 Clicks" Mobility   Outcome Measure  Help needed turning from your back to your side while in a flat bed without using bedrails?: A Lot Help needed moving from lying on your back to sitting on the side of a flat bed without using bedrails?: A Lot Help needed moving to and from a bed to a chair (including a wheelchair)?: Total Help needed standing up from a chair using your arms (e.g., wheelchair or bedside chair)?: Total Help needed to walk in hospital room?: Total Help needed climbing 3-5 steps with a railing? : Total 6 Click Score: 8    End of Session Equipment Utilized During Treatment: Oxygen(2L) Activity Tolerance: Patient tolerated treatment well;Patient limited by fatigue Patient left: in bed;with call bell/phone within reach;with bed alarm set   PT Visit Diagnosis: Muscle weakness (generalized) (M62.81)     Time: 1655-3748 PT Time Calculation (min) (ACUTE ONLY): 28 min  Charges:  $Therapeutic Exercise: 8-22 mins $Therapeutic Activity: 8-22 mins                     [name], PT, DPT   Acute Rehabilitation Department [phone]   [name] 04/03/2019, 12:05 PM

## 2019-04-03 NOTE — Progress Notes (Signed)
PROGRESS NOTE    Pasha Saline Vancamp  TRR:[id number] DOB: February 14, 1927 DOA: 04/02/2019 PCP: System, Pcp Not In    Brief Narrative:  85 y.o.malewith medical history significant foratrial fibrillation on Xarelto, chronic diastolic CHF, asthma/COPD, OSA on BiPAP, hypotension on midodrine, and chronic sacral ulcer, who was brought to the emergency department for evaluation of progressive abdominal distention with alternating constipation and watery stool.  Found to have perinephric fluid collection requiring nephrostomy tube. Patient is from long-term nursing home.  He used to walk with a walker and not done so for at least 6 months now.  Mostly wheelchair-bound. Underwent multiple procedures , percutaneous nephrostomy tube on 11/5, 11/11, 04/02/2019 with radiology. Remains in the hospital to complete treatment.   Assessment & Plan:   Principal Problem:   Perinephric fluid collection Active Problems:   Asthma   Paroxysmal Atrial fibrillation - admitted with RVR   Chronic diastolic CHF (congestive heart failure) (HCC)   AF (paroxysmal atrial fibrillation) (HCC)   Hypokalemia   Ileus (HCC)   Closed compression fracture of L4 vertebra (HCC)   Sacral pressure ulcer   Renal cyst, left  Abdominal pain secondary to perinephric fluid collection/left-sided hydronephrosis with urinoma/complicated UTI: 03/22/2019, superior pole nephrostomy tube placed 03/28/2019, inferior pole nephrostomy tube placed due to collection not drained. 04/02/2019, inadequate drainage, new nephrostomy tubes placed x2. Urine culture with Proteus.  Blood cultures negative.  Still has inadequately drained infection, will continue ceftriaxone until surgical procedures are done. Patient will be needing ureteric stent?  Timing, will discuss with urology.  Hematuria: Patient developed gross hematuria and now improved.  Continue on heparin  Until no more surgical procedures.  Will change to Xarelto when no more surgery needed.    Chronic atrial fibrillation: Rate controlled.  On amiodarone.  in sinus rhythm.  Was on Xarelto prior to admission that is on hold pending clinical improvement.  Now on heparin.  Hypotension: Blood pressures fairly stable.  Patient on midodrine that he will continue.  Chronic sacral pressure ulcer stage IV present on admission: Followed by wound care. Stable.   Deconditioning/long-term nursing home resident: Work with PT OT.  Unsure whether he is rehab candidate.  Needs to go back to skilled nursing facility.  We will discussed with urology, if surgical procedures planned in the near future, he needs to stay on heparin bridging, will stay in the hospital.  DVT prophylaxis: Heparin infusion Code Status: Partial Family Communication: none  Disposition Plan: Back to nursing home when stable and needing no more surgeries.   Consultants:   Urology  Interventional radiology  Procedures:   Percutaneous drain, 11/5, 11/11 left kidney  Antimicrobials:   Rocephin, 03/27/2019---   Subjective: Seen and examined.  No overnight events. Sinus rhythm. No more hematuria.  Pain is controlled. Drain output 15 cc each.  Objective: Vitals:   04/03/19 0411 04/03/19 0458 04/03/19 0816 04/03/19 0851  BP: (!) 101/43   (!) 104/38  Pulse: 69   68  Resp: 20     Temp: 98.4 F (36.9 C)     TempSrc: Oral     SpO2: (!) 89% 94% (!) 89% 95%  Weight: 100.2 kg     Height:        Intake/Output Summary (Last 24 hours) at 04/03/2019 1042 Last data filed at 04/03/2019 0900 Gross per 24 hour  Intake 1597.42 ml  Output 1030 ml  Net 567.42 ml   Filed Weights   04/01/19 0500 04/02/19 0559 04/03/19 0411  Weight: 96.6 kg  100.2 kg 100.2 kg    Examination: Physical Exam  Constitutional: He is oriented to person, place, and time.  Chronically sick looking.  Frail old man appropriate for his age.  On 2 L oxygen.  HENT:  Head: Normocephalic and atraumatic.  Eyes: Pupils are equal, round, and  reactive to light.  Neck: Neck supple.  Cardiovascular: Normal rate and regular rhythm.  Pulmonary/Chest: Breath sounds normal.  Abdominal: Bowel sounds are normal.  Obese pendulous.  No localized tenderness.   Percutaneous tubes with minimal urine drain. Condom catheter with clear urine.  Neurological: He is alert and oriented to person, place, and time.  Psychiatric: Mood normal.      Data Reviewed: I have personally reviewed following labs and imaging studies  CBC: Recent Labs  Lab 03/30/19 0451 03/31/19 0521 04/01/19 0731 04/02/19 0551 04/03/19 0351  WBC 9.2 8.7 7.5 7.7 7.1  HGB 9.5* 8.8* 9.0* 9.2* 8.7*  HCT 31.0* 29.4* 28.4* 29.3* 28.6*  MCV 103.7* 103.2* 100.7* 100.3* 102.5*  PLT 313 287 284 315 259   Basic Metabolic Panel: Recent Labs  Lab 03/28/19 0336 03/29/19 1027 03/30/19 0451 03/31/19 0521  NA 140 141 139 139  K 3.8 3.8 4.3 4.4  CL 97* 98 96* 93*  CO2 36* 34* 37* 37*  GLUCOSE 103* 118* 108* 107*  BUN 18 12 19 21   CREATININE 0.58* 0.54* 0.55* 0.61  CALCIUM 8.9 8.9 9.0 9.3   GFR: Estimated Creatinine Clearance: 74.3 mL/min (by C-G formula based on SCr of 0.61 mg/dL). Liver Function Tests: No results for input(s): AST, ALT, ALKPHOS, BILITOT, PROT, ALBUMIN in the last 168 hours. No results for input(s): LIPASE, AMYLASE in the last 168 hours. No results for input(s): AMMONIA in the last 168 hours. Coagulation Profile: No results for input(s): INR, PROTIME in the last 168 hours. Cardiac Enzymes: No results for input(s): CKTOTAL, CKMB, CKMBINDEX, TROPONINI in the last 168 hours. BNP (last 3 results) No results for input(s): PROBNP in the last 8760 hours. HbA1C: No results for input(s): HGBA1C in the last 72 hours. CBG: No results for input(s): GLUCAP in the last 168 hours. Lipid Profile: No results for input(s): CHOL, HDL, LDLCALC, TRIG, CHOLHDL, LDLDIRECT in the last 72 hours. Thyroid Function Tests: No results for input(s): TSH, T4TOTAL, FREET4,  T3FREE, THYROIDAB in the last 72 hours. Anemia Panel: No results for input(s): VITAMINB12, FOLATE, FERRITIN, TIBC, IRON, RETICCTPCT in the last 72 hours. Sepsis Labs: No results for input(s): PROCALCITON, LATICACIDVEN in the last 168 hours.  Recent Results (from the past 240 hour(s))  Aerobic/Anaerobic Culture (surgical/deep wound)     Status: None   Collection Time: 03/28/19  4:19 PM   Specimen: Abscess  Result Value Ref Range Status   Specimen Description ABSCESS PERINEPHRIC DRAIN  Final   Special Requests Normal  Final   Gram Stain   Final    RARE WBC PRESENT,BOTH PMN AND MONONUCLEAR NO ORGANISMS SEEN    Culture   Final    RARE PROTEUS MIRABILIS NO ANAEROBES ISOLATED Performed at [facility], [address], [place], [place] [address]    Report Status 04/02/2019 FINAL  Final   Organism ID, Bacteria PROTEUS MIRABILIS  Final      Susceptibility   Proteus mirabilis - MIC*    AMPICILLIN >=32 RESISTANT Resistant     CEFAZOLIN 16 SENSITIVE Sensitive     CEFEPIME <=1 SENSITIVE Sensitive     CEFTAZIDIME <=1 SENSITIVE Sensitive     CEFTRIAXONE <=1 SENSITIVE Sensitive  CIPROFLOXACIN >=4 RESISTANT Resistant     GENTAMICIN <=1 SENSITIVE Sensitive     IMIPENEM 2 SENSITIVE Sensitive     TRIMETH/SULFA >=320 RESISTANT Resistant     AMPICILLIN/SULBACTAM >=32 RESISTANT Resistant     PIP/TAZO <=4 SENSITIVE Sensitive     * RARE PROTEUS MIRABILIS  Culture, Urine     Status: None   Collection Time: 03/30/19  6:54 AM   Specimen: Urine, Random  Result Value Ref Range Status   Specimen Description URINE, RANDOM BAG,BED  Final   Special Requests NONE  Final   Culture   Final    NO GROWTH Performed at [facility], [address] [place], [place] [address]    Report Status 03/31/2019 FINAL  Final  SARS CORONAVIRUS 2 (TAT 6-24 HRS) Nasopharyngeal Nasopharyngeal Swab     Status: None   Collection Time: 04/01/19 11:55 AM   Specimen: Nasopharyngeal Swab  Result Value Ref  Range Status   SARS Coronavirus 2 NEGATIVE NEGATIVE Final    Comment: (NOTE) SARS-CoV-2 target nucleic acids are NOT DETECTED. The SARS-CoV-2 RNA is generally detectable in upper and lower respiratory specimens during the acute phase of infection. Negative results do not preclude SARS-CoV-2 infection, do not rule out co-infections with other pathogens, and should not be used as the sole basis for treatment or other patient management decisions. Negative results must be combined with clinical observations, patient history, and epidemiological information. The expected result is Negative. Fact Sheet for Patients: HairSlick.nohttps://www.fda.gov/media/138098/download Fact Sheet for Healthcare Providers: quierodirigir.comhttps://www.fda.gov/media/138095/download This test is not yet approved or cleared by the [place] FDA and  has been authorized for detection and/or diagnosis of SARS-CoV-2 by FDA under an Emergency Use Authorization (EUA). This EUA will remain  in effect (meaning this test can be used) for the duration of the COVID-19 declaration under Section 56 4(b)(1) of the Act, 21 U.S.C. section 360bbb-3(b)(1), unless the authorization is terminated or revoked sooner. Performed at [facility], [address] [place], [place] [address]          Radiology Studies: Ct Abdomen Pelvis W Contrast  Result Date: 04/02/2019 CLINICAL DATA:  Patient admitted with sepsis found to have a left-sided obstructing pelvic stone with hydronephrosis and associated perinephric fluid collections for which the patient underwent image guided placement of a left-sided percutaneous nephrostomy catheter on 03/22/2019 as well as CT-guided placement of a perinephric fluid collection on both 03/22/2019 as well as 03/28/2019. Repeat CT imaging is performed secondary to lack of output from the percutaneous drainage catheters. EXAM: CT ABDOMEN AND PELVIS WITH CONTRAST TECHNIQUE: Multidetector CT imaging of the abdomen and  pelvis was performed using the standard protocol following bolus administration of intravenous contrast. CONTRAST:  100mL OMNIPAQUE IOHEXOL 300 MG/ML  SOLN COMPARISON:  CT abdomen and pelvis-03/30/2019; image guided left-sided percutaneous nephrostomy catheter placement-03/22/2019; CT-guided percutaneous catheter placement-03/22/2019; 03/28/2019 FINDINGS: Lower chest: Limited visualization of the lower thorax demonstrates interval increase in small left and trace right-sided pleural effusion with associated worsening bibasilar consolidative opacities. Cardiomegaly. Coronary artery calcifications. Calcifications of the aortic valve root. No pericardial effusion. Hepatobiliary: Normal hepatic contour. There is a punctate subcentimeter hypoattenuating lesion within the right lobe of the liver (image 18, series 3), which is too small to adequately characterize of favored to represent a hepatic cyst. No discrete worrisome hepatic lesions. Normal appearance of the gallbladder given underdistention. No intra or extrahepatic biliary duct dilatation. No ascites. Pancreas: The pancreas appears somewhat atrophic but otherwise normal. Spleen: Normal appearance  of the spleen. Note is made of a small splenule. Adrenals/Urinary Tract: There is slightly delayed enhancement and excretion of the left kidney in comparison to the right. Interval retraction of left-sided percutaneous nephrostomy catheter external to the left renal collecting system with development of recurrent moderate left-sided pelvicaliectasis Interval retraction of both left-sided perinephric abscess drainage catheters with Re Q malacia of perinephric fluid with dominant collection about posteromedial aspect of the left kidney measuring approximately 4.5 x 2.1 cm and bilobed collection about the more inferior aspect of the left kidney measuring approximately 7.6 x 3.0 cm, similar to presentation CT scan performed 04/06/19. Redemonstrated clustered stones within  the left renal pelvis and superior pole of the left kidney. No evidence of right-sided nephrolithiasis or urinary obstruction. Redemonstrated approximately 4.1 x 3.2 cm hypoattenuating nonenhancing minimally complex cyst arising from the superomedial aspect the right kidney (image 32, series 3; image 8, series 8). No discrete right-sided renal lesions. Normal appearance of the bilateral adrenal glands. There is thickening involving the lateral aspects of the urinary bladder wall. Stomach/Bowel: Moderate to large colonic stool burden without evidence of enteric obstruction. Normal appearance of the terminal ileum and appendix. No pneumoperitoneum, pneumatosis or portal venous gas. Vascular/Lymphatic: Probably there is a large calcified atherosclerotic amount of irregular plaque through a tortuous but normal caliber abdominal aorta. The major branch vessels of the abdominal aorta, including the IMA, appear patent on this non CTA examination. No bulky retroperitoneal, mesenteric, pelvic or inguinal lymphadenopathy. Reproductive: Dystrophic calcifications within normal sized prostate gland. No free fluid the pelvic cul-de-sac. Other: Mild diffuse body wall anasarca. Tiny mesenteric fat containing periumbilical hernia. Large (at least 7.2 x 3.6 x 9.8 mesenteric fat containing indirect right-sided inguinal hernia. Musculoskeletal: Redemonstrates sacral wound with associated absence/bony erosion of the distal end of the sacrum and partial absence of the coccyx (image 32, series 7). Post left total hip replacement. Unchanged severe (approximately 75%) L3 compression deformity, similar to presentation CT scan performed 2019/04/06. IMPRESSION: 1. Interval retraction of left-sided percutaneous nephrostomy catheter outside the left renal collecting system with recurrent moderate left-sided pelvicaliectasis. 2. Interval retraction/malpositioning of both perinephric abscess drainage catheters with recurrence of bilateral  perinephric fluid collections. 3. Unchanged left-sided nephrolithiasis. 4. Slight increase in small left and trace right-sided pleural effusions. 5. Redemonstrated sacral wound with associated bony erosion involving the distal tip of the sacrum and coccyx. 6. Unchanged severe (approximately 75%) L3 compression deformity with persistent fracture line. Correlation for point tenderness at this location is advised. Further evaluation with lumbar spine MRI could performed as indicated. 7. Moderate to large colonic stool burden without evidence of enteric obstruction. 8.  Aortic Atherosclerosis (ICD10-I70.0). Electronically Signed   By: Simonne Come M.D.   On: 04/02/2019 16:51   Ir Sinus/fist Tube Chk-non Gi  Result Date: 04/02/2019 INDICATION: Patient admitted on 06-Apr-2019 with sepsis found to have obstructing left-sided nephrolithiasis, for which the patient underwent image guided placement of a left-sided percutaneous drainage catheter and CT-guided placement of a perinephric abscess drainage catheter 03/22/2019. Patient underwent subsequent additional perinephric abscess drainage catheter placement on 03/28/2019. Unfortunately, CT scan performed earlier today demonstrates retraction of the percutaneous nephrostomy catheter outside the left renal collecting system as well as both perinephric drains outside of both perinephric fluid collections. EXAM: 1. ATTEMPTED NEPHROSTOGRAM VIA EXISTING NEPHROSTOMY CATHETER WITH SUBSEQUENT REMOVAL. 2. IMAGE GUIDED LEFT-SIDED PERCUTANEOUS NEPHROSTOMY CATHETER PLACEMENT 3. LEFT-SIDED PERINEPHRIC ABSCESS DRAINAGE CATHETER INJECTION X2 COMPARISON:  CT abdomen pelvis-earlier same day; 03/27/2019; 04-06-2019; image guided left-sided percutaneous  nephrostomy catheter placement-03/22/2019; CT-guided left-sided perinephric abscess drainage catheter placement-03/22/2019; 03/28/2019 MEDICATIONS: Patient is currently admitted to the hospital receiving intravenous antibiotics; the  antibiotic was administered in an appropriate time frame prior to skin puncture. ANESTHESIA/SEDATION: Moderate (conscious) sedation was employed during this procedure. A total of Fentanyl 37.5 mcg was administered intravenously. Moderate Sedation Time: 20 minutes. The patient's level of consciousness and vital signs were monitored continuously by radiology nursing throughout the procedure under my direct supervision. CONTRAST:  20 cc mL Isovue 300 administered into the left collecting system as well as the malpositioned left-sided percutaneous nephrostomy catheter as well as both perinephric abscess drainage catheters. FLUOROSCOPY TIME:  5 minutes, 42 seconds (146 mGy) COMPLICATIONS: None immediate. PROCEDURE: The procedure, risks, benefits, and alternatives were explained to the patient. Questions regarding the procedure were encouraged and answered. The patient understands and consents to the procedure. A timeout was performed prior to the initiation of the procedure. The left flank region as well as the external portions of the left-sided nephrostomy catheter as well as both perinephric drainage catheters were prepped with Betadine in a sterile fashion, and a sterile drape was applied covering the operative field. A sterile gown and sterile gloves were used for the procedure. Local anesthesia was provided with 1% Lidocaine with epinephrine. Preprocedural spot fluoroscopic image was obtained of the left upper abdomen as well as the existing left-sided percutaneous nephrostomy catheter in both perinephric abscess drains. Contrast was injected via the existing perinephric abscess drainage catheters however failed to delineate a patent tract to the location of the perinephric abscesses. As such, both drainage catheters cut and intact. Contrast was then injected via the existing malposition nephrostomy catheter however also failed to delineate patency of nephrostomy tract. As such, the external portion of the  nephrostomy catheter was cut and nephrostomy catheter was removed intact. Ultrasound was used to localize the left kidney. Under direct ultrasound guidance, a 21 gauge needle was advanced into the renal collecting system. An ultrasound image documentation was performed. Access within the collecting system was confirmed with the efflux of urine followed by contrast injection. A Nitrex wire was advanced into the left renal collecting system however there is difficulty advancing the Accustick over the Nitrex wire due to the depth of the kidney and associated patient body habitus. As such, a posteroinferior calyx, which was still opacified from contrast-enhanced CT scan performed earlier today, was targeted fluoroscopically with a 22 gauge Chiba needle. Appropriate access to the left renal collecting system was confirmed with injection contrast advancement of a Nitrex wire into the left renal collecting system. Under fluoroscopic guidance, the access needle was exchanged for an Accustick set was advanced to the level of the left renal pelvis. Contrast injection was performed. Over a short Amplatz wire, the track was dilated allowing placement of a 10 French percutaneous nephrostomy catheter with end coiled and locked left renal pelvis. A small amount of contrast was injected demonstrating appropriate position functionality of the nephrostomy catheter The nephrostomy catheter was secured to the skin entrance site with 2 interrupted sutures and a Stat device. The nephrostomy with connected to a gravity bag a dressing was applied. The patient tolerated the procedure well without immediate postprocedural complication. FINDINGS: Fluoroscopic guided injection both perinephric drainage catheters demonstrates lack a residual subcutaneous track to the level of the known residual/recurrent perinephric abscesses. As such, both percutaneous drainage catheters were cut and removed intact. Fluoroscopic guided injection of the  existing malposition of proximally catheter demonstrates lack of residual subcutaneous  track to the level of the left renal system. As such, the pre-existing nephrostomy catheter was cut and removed intact. Ultimately, under fluoroscopic guidance, a dilated posteroinferior calyx (still opacified from contrast-enhanced CT scan performed earlier today), was targeted fluoroscopically allowing placement of a 10 French percutaneous nephrostomy catheter with end coiled and locked within the left renal pelvis. IMPRESSION: 1. Malpositioned left-sided percutaneous nephrostomy catheter requiring removal and definitive new nephrostomy catheter placement. 2. Successful fluoroscopic guided placement of a new 10.2 French left-sided percutaneous nephrostomy catheter. 3. Malpositioned left-sided perinephric abscess drainage catheters requiring new CT-guided perinephric abscess drainage catheter placement. PLAN: - The patient was escorted to CT to undergo replacement of both left-sided perinephric abscess drainage catheters. - Recommend IV only CT scan of the abdomen and pelvis once output from both perinephric abscess drainage catheters is less than 10 cc per day (excluding flushes). Recommend drainage catheter injection prior to removal to exclude communication to the left renal collecting system. - Per my discussion with the patient's urologist (Dr. Alvester MorinBell) earlier today, we will plan on attempted antegrade nephrostogram and placement of left-sided ureteral stent once patient recovers from the above procedures. Electronically Signed   By: Simonne ComeJohn  Watts M.D.   On: 04/02/2019 18:04   Ir Sinus/fist Tube Chk-non Gi  Result Date: 04/02/2019 INDICATION: Patient admitted on 04/16/2019 with sepsis found to have obstructing left-sided nephrolithiasis, for which the patient underwent image guided placement of a left-sided percutaneous drainage catheter and CT-guided placement of a perinephric abscess drainage catheter 03/22/2019. Patient  underwent subsequent additional perinephric abscess drainage catheter placement on 03/28/2019. Unfortunately, CT scan performed earlier today demonstrates retraction of the percutaneous nephrostomy catheter outside the left renal collecting system as well as both perinephric drains outside of both perinephric fluid collections. EXAM: 1. ATTEMPTED NEPHROSTOGRAM VIA EXISTING NEPHROSTOMY CATHETER WITH SUBSEQUENT REMOVAL. 2. IMAGE GUIDED LEFT-SIDED PERCUTANEOUS NEPHROSTOMY CATHETER PLACEMENT 3. LEFT-SIDED PERINEPHRIC ABSCESS DRAINAGE CATHETER INJECTION X2 COMPARISON:  CT abdomen pelvis-earlier same day; 03/27/2019; 04/01/2019; image guided left-sided percutaneous nephrostomy catheter placement-03/22/2019; CT-guided left-sided perinephric abscess drainage catheter placement-03/22/2019; 03/28/2019 MEDICATIONS: Patient is currently admitted to the hospital receiving intravenous antibiotics; the antibiotic was administered in an appropriate time frame prior to skin puncture. ANESTHESIA/SEDATION: Moderate (conscious) sedation was employed during this procedure. A total of Fentanyl 37.5 mcg was administered intravenously. Moderate Sedation Time: 20 minutes. The patient's level of consciousness and vital signs were monitored continuously by radiology nursing throughout the procedure under my direct supervision. CONTRAST:  20 cc mL Isovue 300 administered into the left collecting system as well as the malpositioned left-sided percutaneous nephrostomy catheter as well as both perinephric abscess drainage catheters. FLUOROSCOPY TIME:  5 minutes, 42 seconds (146 mGy) COMPLICATIONS: None immediate. PROCEDURE: The procedure, risks, benefits, and alternatives were explained to the patient. Questions regarding the procedure were encouraged and answered. The patient understands and consents to the procedure. A timeout was performed prior to the initiation of the procedure. The left flank region as well as the external portions of the  left-sided nephrostomy catheter as well as both perinephric drainage catheters were prepped with Betadine in a sterile fashion, and a sterile drape was applied covering the operative field. A sterile gown and sterile gloves were used for the procedure. Local anesthesia was provided with 1% Lidocaine with epinephrine. Preprocedural spot fluoroscopic image was obtained of the left upper abdomen as well as the existing left-sided percutaneous nephrostomy catheter in both perinephric abscess drains. Contrast was injected via the existing perinephric abscess drainage catheters however failed  to delineate a patent tract to the location of the perinephric abscesses. As such, both drainage catheters cut and intact. Contrast was then injected via the existing malposition nephrostomy catheter however also failed to delineate patency of nephrostomy tract. As such, the external portion of the nephrostomy catheter was cut and nephrostomy catheter was removed intact. Ultrasound was used to localize the left kidney. Under direct ultrasound guidance, a 21 gauge needle was advanced into the renal collecting system. An ultrasound image documentation was performed. Access within the collecting system was confirmed with the efflux of urine followed by contrast injection. A Nitrex wire was advanced into the left renal collecting system however there is difficulty advancing the Accustick over the Nitrex wire due to the depth of the kidney and associated patient body habitus. As such, a posteroinferior calyx, which was still opacified from contrast-enhanced CT scan performed earlier today, was targeted fluoroscopically with a 22 gauge Chiba needle. Appropriate access to the left renal collecting system was confirmed with injection contrast advancement of a Nitrex wire into the left renal collecting system. Under fluoroscopic guidance, the access needle was exchanged for an Accustick set was advanced to the level of the left renal pelvis.  Contrast injection was performed. Over a short Amplatz wire, the track was dilated allowing placement of a 10 French percutaneous nephrostomy catheter with end coiled and locked left renal pelvis. A small amount of contrast was injected demonstrating appropriate position functionality of the nephrostomy catheter The nephrostomy catheter was secured to the skin entrance site with 2 interrupted sutures and a Stat device. The nephrostomy with connected to a gravity bag a dressing was applied. The patient tolerated the procedure well without immediate postprocedural complication. FINDINGS: Fluoroscopic guided injection both perinephric drainage catheters demonstrates lack a residual subcutaneous track to the level of the known residual/recurrent perinephric abscesses. As such, both percutaneous drainage catheters were cut and removed intact. Fluoroscopic guided injection of the existing malposition of proximally catheter demonstrates lack of residual subcutaneous track to the level of the left renal system. As such, the pre-existing nephrostomy catheter was cut and removed intact. Ultimately, under fluoroscopic guidance, a dilated posteroinferior calyx (still opacified from contrast-enhanced CT scan performed earlier today), was targeted fluoroscopically allowing placement of a 10 French percutaneous nephrostomy catheter with end coiled and locked within the left renal pelvis. IMPRESSION: 1. Malpositioned left-sided percutaneous nephrostomy catheter requiring removal and definitive new nephrostomy catheter placement. 2. Successful fluoroscopic guided placement of a new 10.2 French left-sided percutaneous nephrostomy catheter. 3. Malpositioned left-sided perinephric abscess drainage catheters requiring new CT-guided perinephric abscess drainage catheter placement. PLAN: - The patient was escorted to CT to undergo replacement of both left-sided perinephric abscess drainage catheters. - Recommend IV only CT scan of the  abdomen and pelvis once output from both perinephric abscess drainage catheters is less than 10 cc per day (excluding flushes). Recommend drainage catheter injection prior to removal to exclude communication to the left renal collecting system. - Per my discussion with the patient's urologist (Dr. Alvester Morin) earlier today, we will plan on attempted antegrade nephrostogram and placement of left-sided ureteral stent once patient recovers from the above procedures. Electronically Signed   By: Simonne Come M.D.   On: 04/02/2019 18:04   Ct Image Guided Fluid Drain By Catheter  Result Date: 04/03/2019 INDICATION: Recent admission with urosepsis, post left-sided percutaneous nephrostomy catheter placement on 03/22/2019 as well as CT guided perinephric abscess drainage catheter placement on 03/22/2019 and again on 03/28/2019. Unfortunately, CT scan of the  abdomen pelvis performed earlier today retraction of the nephrostomy catheter outside the left renal collecting system as well as retraction of both perinephric abscess drainage catheters outside the residual perinephric fluid collection/abscesses. Patient has undergone image guided placement of a new left-sided percutaneous nephrostomy catheter earlier today however attempted fluoroscopic guided replacement of the perinephric drains was not possible due to lack of a subcutaneous track. As such, patient now presents for CT guided replacement of perinephric abscess drainage catheters. EXAM: CT IMAGE GUIDED FLUID DRAIN BY CATHETER x2 COMPARISON:  Image guided percutaneous nephrostomy catheter placement-03/22/2019; earlier same day; CT-guided percutaneous drainage catheter placement-03/22/2019; 03/28/2019; CT abdomen pelvis-earlier same day; 03/27/2019; 03/25/2019 MEDICATIONS: The patient is currently admitted to the hospital and receiving intravenous antibiotics. The antibiotics were administered within an appropriate time frame prior to the initiation of the procedure.  ANESTHESIA/SEDATION: None - the patient was still sedated from medications administered during preceding nephrostomy placement CONTRAST:  None COMPLICATIONS: None immediate. PROCEDURE: Informed written consent was obtained from the patient after a discussion of the risks, benefits and alternatives to treatment. The patient was placed supine, slightly RPO on the CT gantry and a pre procedural CT was performed re-demonstrating the known abscess/fluid collections within the left perinephric space with dominant collection about the posteromedial aspect the left kidney measuring approximately 4.4 by 1.8 cm (image 59, series 3 and additional bilobed collection about the more caudal aspect of the left kidney measuring approximately 7.7 x 3.0 cm (image 63, series 2). The procedure was planned. A timeout was performed prior to the initiation of the procedure. The skin overlying the left lateral abdomen was prepped and draped in the usual sterile fashion. The overlying soft tissues were anesthetized with 1% lidocaine with epinephrine. Appropriate trajectory was planned with the use of a 22 gauge spinal needles. Next, 18 gauge trocar needles were advanced into both perinephric abscess fluid collections under intermittent CT guidance. Short Amplatz wires were coiled within both collections. Appropriate positioning was confirmed with a limited CT scan. The tracks were serially dilated allowing placement of 10.2 French drainage catheters into both residual/recurrent perinephric fluid collections. The dominant caudal collection yielded approximately 10 cc of purulent material while the smaller collection yielded approximately 5 cc of purulent material. Both drainage catheters were flushed with a small amount of saline and connected to JP bulbs, sutured in place. Dressings and StatLock devices were applied. The patient tolerated the procedure well without immediate post procedural complication. IMPRESSION: Successful CT guided  placement of a 10 French all purpose drain catheter into residual/recurrent perinephric abscesses with dominant collection yielding 10 cc of purulent fluid and smaller collection yielding 5 cc of purulent fluid. PLAN: - Recommend IV only CT scan the abdomen pelvis 1 output from both perinephric abscess drainage catheters is less than 10 cc per day (excluding flush). - Recommend drainage catheter injection prior to removal to exclude communication to the left renal collecting system. Electronically Signed   By: Simonne Come M.D.   On: 04/03/2019 07:54   Ct Image Guided Fluid Drain By Catheter  Result Date: 04/03/2019 INDICATION: Recent admission with urosepsis, post left-sided percutaneous nephrostomy catheter placement on 03/22/2019 as well as CT guided perinephric abscess drainage catheter placement on 03/22/2019 and again on 03/28/2019. Unfortunately, CT scan of the abdomen pelvis performed earlier today retraction of the nephrostomy catheter outside the left renal collecting system as well as retraction of both perinephric abscess drainage catheters outside the residual perinephric fluid collection/abscesses. Patient has undergone image guided placement of  a new left-sided percutaneous nephrostomy catheter earlier today however attempted fluoroscopic guided replacement of the perinephric drains was not possible due to lack of a subcutaneous track. As such, patient now presents for CT guided replacement of perinephric abscess drainage catheters. EXAM: CT IMAGE GUIDED FLUID DRAIN BY CATHETER x2 COMPARISON:  Image guided percutaneous nephrostomy catheter placement-03/22/2019; earlier same day; CT-guided percutaneous drainage catheter placement-03/22/2019; 03/28/2019; CT abdomen pelvis-earlier same day; 03/27/2019; 03/31/2019 MEDICATIONS: The patient is currently admitted to the hospital and receiving intravenous antibiotics. The antibiotics were administered within an appropriate time frame prior to the  initiation of the procedure. ANESTHESIA/SEDATION: None - the patient was still sedated from medications administered during preceding nephrostomy placement CONTRAST:  None COMPLICATIONS: None immediate. PROCEDURE: Informed written consent was obtained from the patient after a discussion of the risks, benefits and alternatives to treatment. The patient was placed supine, slightly RPO on the CT gantry and a pre procedural CT was performed re-demonstrating the known abscess/fluid collections within the left perinephric space with dominant collection about the posteromedial aspect the left kidney measuring approximately 4.4 by 1.8 cm (image 59, series 3 and additional bilobed collection about the more caudal aspect of the left kidney measuring approximately 7.7 x 3.0 cm (image 63, series 2). The procedure was planned. A timeout was performed prior to the initiation of the procedure. The skin overlying the left lateral abdomen was prepped and draped in the usual sterile fashion. The overlying soft tissues were anesthetized with 1% lidocaine with epinephrine. Appropriate trajectory was planned with the use of a 22 gauge spinal needles. Next, 18 gauge trocar needles were advanced into both perinephric abscess fluid collections under intermittent CT guidance. Short Amplatz wires were coiled within both collections. Appropriate positioning was confirmed with a limited CT scan. The tracks were serially dilated allowing placement of 10.2 French drainage catheters into both residual/recurrent perinephric fluid collections. The dominant caudal collection yielded approximately 10 cc of purulent material while the smaller collection yielded approximately 5 cc of purulent material. Both drainage catheters were flushed with a small amount of saline and connected to JP bulbs, sutured in place. Dressings and StatLock devices were applied. The patient tolerated the procedure well without immediate post procedural complication.  IMPRESSION: Successful CT guided placement of a 10 French all purpose drain catheter into residual/recurrent perinephric abscesses with dominant collection yielding 10 cc of purulent fluid and smaller collection yielding 5 cc of purulent fluid. PLAN: - Recommend IV only CT scan the abdomen pelvis 1 output from both perinephric abscess drainage catheters is less than 10 cc per day (excluding flush). - Recommend drainage catheter injection prior to removal to exclude communication to the left renal collecting system. Electronically Signed   By: Simonne Come M.D.   On: 04/03/2019 07:54        Scheduled Meds:  amiodarone  200 mg Oral Daily   famotidine  20 mg Oral BID   feeding supplement (PRO-STAT SUGAR FREE 64)  30 mL Oral BID WC   gabapentin  400 mg Oral TID   midodrine  10 mg Oral TID WC   multivitamin with minerals  1 tablet Oral Daily   nutrition supplement (JUVEN)  1 packet Oral BID BM   potassium chloride  20 mEq Oral Daily   Ensure Max Protein  11 oz Oral QHS   sodium chloride flush  3 mL Intravenous Q12H   sodium chloride flush  5 mL Intracatheter Q8H   umeclidinium bromide  1 puff Inhalation Daily  Continuous Infusions:  cefTRIAXone (ROCEPHIN)  IV 2 g (04/02/19 1827)   heparin 2,050 Units/hr (04/03/19 0552)     LOS: 12 days    Time spent: 25 minutes    Dorcas Carrow, MD [facility] Pager [phone]

## 2019-04-04 LAB — CBC
HCT: 27.9 % — ABNORMAL LOW (ref 39.0–52.0)
Hemoglobin: 8.4 g/dL — ABNORMAL LOW (ref 13.0–17.0)
MCH: 30.9 pg (ref 26.0–34.0)
MCHC: 30.1 g/dL (ref 30.0–36.0)
MCV: 102.6 fL — ABNORMAL HIGH (ref 80.0–100.0)
Platelets: 290 10*3/uL (ref 150–400)
RBC: 2.72 MIL/uL — ABNORMAL LOW (ref 4.22–5.81)
RDW: 15.4 % (ref 11.5–15.5)
WBC: 6.7 10*3/uL (ref 4.0–10.5)
nRBC: 0 % (ref 0.0–0.2)

## 2019-04-04 LAB — HEPARIN LEVEL (UNFRACTIONATED): Heparin Unfractionated: 0.44 IU/mL (ref 0.30–0.70)

## 2019-04-04 NOTE — Plan of Care (Signed)
  Problem: Education: Goal: Knowledge of General Education information will improve Description: Including pain rating scale, medication(s)/side effects and non-pharmacologic comfort measures Outcome: Progressing   Problem: Health Behavior/Discharge Planning: Goal: Ability to manage health-related needs will improve Outcome: Progressing   Problem: Clinical Measurements: Goal: Ability to maintain clinical measurements within normal limits will improve Outcome: Progressing   Problem: Skin Integrity: Goal: Risk for impaired skin integrity will decrease Outcome: Progressing   Problem: Elimination: Goal: Will not experience complications related to bowel motility Outcome: Progressing   

## 2019-04-04 NOTE — Progress Notes (Signed)
Nutrition Follow-up  INTERVENTION:   -Continue MVI with minerals daily -Continue 30 ml Prostat BID with meals, each supplement provides 100 kcals and 15 grams protein -Continue Ensure Max poq HS, each supplement provides 150 kcal and 30 grams of protein -Continue 1 packet Juven BID, each packet provides 95 calories, 2.5 grams of protein (collagen), and 9.8 grams of carbohydrate (3 grams sugar); also contains 7 grams of L-arginine and L-glutamine, 300 mg vitamin C, 15 mg vitamin E, 1.2 mcg vitamin B-12, 9.5 mg zinc, 200 mg calcium, and 1.5 g Calcium Beta-hydroxy-Beta-methylbutyrate to support wound healing  NUTRITION DIAGNOSIS:   Increased nutrient needs related to wound healing as evidenced by estimated needs.  Ongoing.  GOAL:   Patient will meet greater than or equal to 90% of their needs  Progressing.  MONITOR:   PO intake, Supplement acceptance, Labs, Weight trends, Skin, I & O's  ASSESSMENT:   [name] is a 85 y.o. male with medical history significant for atrial fibrillation on Xarelto, chronic diastolic CHF, asthma/COPD, OSA on BiPAP, hypotension on midodrine, and chronic sacral ulcer, now presenting to the emergency department for evaluation of progressive abdominal distention with alternating constipation and watery stool.  Patient is accompanied by his daughter who assists with the history.  Patient reportedly developed constipation several days ago, reports some abdominal distention since then, and has had a couple watery stools.  He denies any abdominal pain but reports some mild generalized discomfort.  He denies nausea or vomiting but his daughter notes that he vomited a small amount earlier.  Patient denies any fevers, chills, flank pain, or dysuria.  He denies any change in his chronic back pain.  11/5- s/p lt nephrostomy tube placement, CT guided abscess drain placement 11/11 -s/p CT guided drainage catheter placement 11/16 -s/p left sided PCN placement  **RD  working remotely**  Patient continues to eat well, consuming 50-100% of meals over the past 24 hours. Pt is compliant with all supplements ordered to aid in wound healing.   Admission weight: 208 lbs. Current weight: 219 lbs.  I/Os: -842 ml since admit UOP: 2.1L x 24 hrs Drains: 22.5 ml  Labs reviewed. Medications: Multivitamin with minerals daily, KLOR-CON packet  Diet Order:   Diet Order            DIET - DYS 1 Room service appropriate? Yes; Fluid consistency: Thin  Diet effective now              EDUCATION NEEDS:   No education needs have been identified at this time  Skin:  Skin Assessment: Skin Integrity Issues: Skin Integrity Issues:: Stage IV Stage IV: sacrum  Last BM:  11/17  Height:   Ht Readings from Last 1 Encounters:  03/22/19 5\' 10"  (1.778 m)    Weight:   Wt Readings from Last 1 Encounters:  04/04/19 99.3 kg    Ideal Body Weight:  75.5 kg  BMI:  Body mass index is 31.42 kg/m.  Estimated Nutritional Needs:   Kcal:  2050-2250  Protein:  100-115 grams  Fluid:  > 2 L  Clayton Bibles, MS, RD, LDN Inpatient Clinical Dietitian Pager: [phone] After Hours Pager: [phone]

## 2019-04-04 NOTE — Progress Notes (Signed)
PROGRESS NOTE    Enmanuel Zufall Ahles  ZOX:[id number] DOB: [birth year] DOA: 03/28/2019 PCP: System, Pcp Not In    Brief Narrative:  85 y.o.malewith medical history significant foratrial fibrillation on Xarelto, chronic diastolic CHF, asthma/COPD, OSA on BiPAP, hypotension on midodrine, and chronic sacral ulcer, who was brought to the emergency department for evaluation of progressive abdominal distention with alternating constipation and watery stool.  Found to have perinephric fluid collection requiring nephrostomy tube. Patient is from long-term nursing home.  He used to walk with a walker and not done so for at least 6 months now.  Mostly wheelchair-bound. Underwent multiple procedures , percutaneous nephrostomy tube on 11/5, 11/11, 04/02/2019 with radiology. Remains in the hospital to complete treatment.   Assessment & Plan:   Principal Problem:   Perinephric fluid collection Active Problems:   Asthma   Paroxysmal Atrial fibrillation - admitted with RVR   Chronic diastolic CHF (congestive heart failure) (HCC)   AF (paroxysmal atrial fibrillation) (HCC)   Hypokalemia   Ileus (HCC)   Closed compression fracture of L4 vertebra (HCC)   Sacral pressure ulcer   Renal cyst, left  Abdominal pain secondary to perinephric fluid collection/left-sided hydronephrosis with urinoma/complicated UTI: 03/22/2019, superior pole nephrostomy tube placed 03/28/2019, inferior pole nephrostomy tube placed due to collection not drained. 04/02/2019, inadequate drainage, new nephrostomy tubes placed x2. Urine culture with Proteus.  Blood cultures negative.  Still has inadequately drained infection, will continue ceftriaxone until surgical procedures are done. Patient will be needing ureteric stent?    Hematuria: Patient developed gross hematuria and now improved.  Continue on heparin  Until no more surgical procedures.  Will change to Xarelto when no more surgery needed.   Chronic atrial fibrillation: Rate  controlled.  On amiodarone.  in sinus rhythm.  Was on Xarelto prior to admission that is on hold pending clinical improvement.  Now on heparin.  Hypotension: Blood pressures fairly stable.  Patient on midodrine that he will continue.  Chronic sacral pressure ulcer stage IV present on admission: Followed by wound care. Stable.   Deconditioning/long-term nursing home resident: Work with PT OT.  Unsure whether he is rehab candidate.  Needs to go back to skilled nursing facility.  Patient will ultimately need ureteric stent by IR once drains are dry, he needs to stay on heparin bridging, will stay in the hospital.  DVT prophylaxis: Heparin infusion Code Status: Partial Family Communication: none, patient's daughter Disposition Plan: Back to nursing home when stable and needing no more surgeries.   Consultants:   Urology  Interventional radiology  Procedures:   Percutaneous drain, 11/5, 11/11 left kidney  Antimicrobials:   Rocephin, 04/12/2019---   Subjective: Seen and examined.  No overnight events. Sinus rhythm. No more hematuria.  Pain is controlled. Drain output 450 mL.  Objective: Vitals:   04/03/19 2324 04/04/19 0430 04/04/19 0745 04/04/19 0913  BP:  (!) 103/51  (!) 95/53  Pulse: 64 68 71   Resp: Temp:  98.8 F (37.1 C)  98.4 F (36.9 C)  TempSrc:  Oral  Oral  SpO2: 93% 91% 92% 95%  Weight:  99.3 kg    Height:        Intake/Output Summary (Last 24 hours) at 04/04/2019 1118 Last data filed at 04/04/2019 0900 Gross per 24 hour  Intake 1399.07 ml  Output 1322.5 ml  Net 76.57 ml   Filed Weights   04/02/19 0559 04/03/19 0411 04/04/19 0430  Weight: 100.2 kg 100.2 kg 99.3 kg  Examination: Physical Exam  Constitutional: He is oriented to person, place, and time.  Chronically sick looking.  Frail old man appropriate for his age.  On 2 L oxygen.  HENT:  Head: Normocephalic and atraumatic.  Eyes: Pupils are equal, round, and reactive to light.    Neck: Neck supple.  Cardiovascular: Normal rate and regular rhythm.  Pulmonary/Chest: Breath sounds normal.  Abdominal: Bowel sounds are normal.  Obese pendulous.  No localized tenderness.   Percutaneous tubes with clear urine.. Condom catheter with clear urine.  Neurological: He is alert and oriented to person, place, and time.  Psychiatric: Mood normal.      Data Reviewed: I have personally reviewed following labs and imaging studies  CBC: Recent Labs  Lab 03/31/19 0521 04/01/19 0731 04/02/19 0551 04/03/19 0351 04/04/19 0530  WBC 8.7 7.5 7.7 7.1 6.7  HGB 8.8* 9.0* 9.2* 8.7* 8.4*  HCT 29.4* 28.4* 29.3* 28.6* 27.9*  MCV 103.2* 100.7* 100.3* 102.5* 102.6*  PLT 287 284 315 317 290   Basic Metabolic Panel: Recent Labs  Lab 03/29/19 1027 03/30/19 0451 03/31/19 0521  NA 141 139 139  K 3.8 4.3 4.4  CL 98 96* 93*  CO2 34* 37* 37*  GLUCOSE 118* 108* 107*  BUN 12 19 21   CREATININE 0.54* 0.55* 0.61  CALCIUM 8.9 9.0 9.3   GFR: Estimated Creatinine Clearance: 73.9 mL/min (by C-G formula based on SCr of 0.61 mg/dL). Liver Function Tests: No results for input(s): AST, ALT, ALKPHOS, BILITOT, PROT, ALBUMIN in the last 168 hours. No results for input(s): LIPASE, AMYLASE in the last 168 hours. No results for input(s): AMMONIA in the last 168 hours. Coagulation Profile: No results for input(s): INR, PROTIME in the last 168 hours. Cardiac Enzymes: No results for input(s): CKTOTAL, CKMB, CKMBINDEX, TROPONINI in the last 168 hours. BNP (last 3 results) No results for input(s): PROBNP in the last 8760 hours. HbA1C: No results for input(s): HGBA1C in the last 72 hours. CBG: No results for input(s): GLUCAP in the last 168 hours. Lipid Profile: No results for input(s): CHOL, HDL, LDLCALC, TRIG, CHOLHDL, LDLDIRECT in the last 72 hours. Thyroid Function Tests: No results for input(s): TSH, T4TOTAL, FREET4, T3FREE, THYROIDAB in the last 72 hours. Anemia Panel: No results for  input(s): VITAMINB12, FOLATE, FERRITIN, TIBC, IRON, RETICCTPCT in the last 72 hours. Sepsis Labs: No results for input(s): PROCALCITON, LATICACIDVEN in the last 168 hours.  Recent Results (from the past 240 hour(s))  Aerobic/Anaerobic Culture (surgical/deep wound)     Status: None   Collection Time: 03/28/19  4:19 PM   Specimen: Abscess  Result Value Ref Range Status   Specimen Description ABSCESS PERINEPHRIC DRAIN  Final   Special Requests Normal  Final   Gram Stain   Final    RARE WBC PRESENT,BOTH PMN AND MONONUCLEAR NO ORGANISMS SEEN    Culture   Final    RARE PROTEUS MIRABILIS NO ANAEROBES ISOLATED Performed at [facility], [address] [place], [place] [id number]    Report Status 04/02/2019 FINAL  Final   Organism ID, Bacteria PROTEUS MIRABILIS  Final      Susceptibility   Proteus mirabilis - MIC*    AMPICILLIN >=32 RESISTANT Resistant     CEFAZOLIN 16 SENSITIVE Sensitive     CEFEPIME <=1 SENSITIVE Sensitive     CEFTAZIDIME <=1 SENSITIVE Sensitive     CEFTRIAXONE <=1 SENSITIVE Sensitive     CIPROFLOXACIN >=4 RESISTANT Resistant     GENTAMICIN <=1 SENSITIVE Sensitive  IMIPENEM 2 SENSITIVE Sensitive     TRIMETH/SULFA >=320 RESISTANT Resistant     AMPICILLIN/SULBACTAM >=32 RESISTANT Resistant     PIP/TAZO <=4 SENSITIVE Sensitive     * RARE PROTEUS MIRABILIS  Culture, Urine     Status: None   Collection Time: 03/30/19  6:54 AM   Specimen: Urine, Random  Result Value Ref Range Status   Specimen Description URINE, RANDOM BAG,BED  Final   Special Requests NONE  Final   Culture   Final    NO GROWTH Performed at [facility], [address] [place], [place] [address]    Report Status 03/31/2019 FINAL  Final  SARS CORONAVIRUS 2 (TAT 6-24 HRS) Nasopharyngeal Nasopharyngeal Swab     Status: None   Collection Time: 04/01/19 11:55 AM   Specimen: Nasopharyngeal Swab  Result Value Ref Range Status   SARS Coronavirus 2 NEGATIVE NEGATIVE Final    Comment:  (NOTE) SARS-CoV-2 target nucleic acids are NOT DETECTED. The SARS-CoV-2 RNA is generally detectable in upper and lower respiratory specimens during the acute phase of infection. Negative results do not preclude SARS-CoV-2 infection, do not rule out co-infections with other pathogens, and should not be used as the sole basis for treatment or other patient management decisions. Negative results must be combined with clinical observations, patient history, and epidemiological information. The expected result is Negative. Fact Sheet for Patients: HairSlick.no Fact Sheet for Healthcare Providers: quierodirigir.com This test is not yet approved or cleared by the [place] FDA and  has been authorized for detection and/or diagnosis of SARS-CoV-2 by FDA under an Emergency Use Authorization (EUA). This EUA will remain  in effect (meaning this test can be used) for the duration of the COVID-19 declaration under Section 56 4(b)(1) of the Act, 21 U.S.C. section 360bbb-3(b)(1), unless the authorization is terminated or revoked sooner. Performed at [facility], [address], [place], [place] [address]          Radiology Studies: Ir Sinus/fist Tube Chk-non Gi  Result Date: 04/02/2019 INDICATION: Patient admitted on 03/23/2019 with sepsis found to have obstructing left-sided nephrolithiasis, for which the patient underwent image guided placement of a left-sided percutaneous drainage catheter and CT-guided placement of a perinephric abscess drainage catheter 03/22/2019. Patient underwent subsequent additional perinephric abscess drainage catheter placement on 03/28/2019. Unfortunately, CT scan performed earlier today demonstrates retraction of the percutaneous nephrostomy catheter outside the left renal collecting system as well as both perinephric drains outside of both perinephric fluid collections. EXAM: 1. ATTEMPTED NEPHROSTOGRAM  VIA EXISTING NEPHROSTOMY CATHETER WITH SUBSEQUENT REMOVAL. 2. IMAGE GUIDED LEFT-SIDED PERCUTANEOUS NEPHROSTOMY CATHETER PLACEMENT 3. LEFT-SIDED PERINEPHRIC ABSCESS DRAINAGE CATHETER INJECTION X2 COMPARISON:  CT abdomen pelvis-earlier same day; 03/27/2019; 2019/03/23; image guided left-sided percutaneous nephrostomy catheter placement-03/22/2019; CT-guided left-sided perinephric abscess drainage catheter placement-03/22/2019; 03/28/2019 MEDICATIONS: Patient is currently admitted to the hospital receiving intravenous antibiotics; the antibiotic was administered in an appropriate time frame prior to skin puncture. ANESTHESIA/SEDATION: Moderate (conscious) sedation was employed during this procedure. A total of Fentanyl 37.5 mcg was administered intravenously. Moderate Sedation Time: 20 minutes. The patient's level of consciousness and vital signs were monitored continuously by radiology nursing throughout the procedure under my direct supervision. CONTRAST:  20 cc mL Isovue 300 administered into the left collecting system as well as the malpositioned left-sided percutaneous nephrostomy catheter as well as both perinephric abscess drainage catheters. FLUOROSCOPY TIME:  5 minutes, 42 seconds (146 mGy) COMPLICATIONS: None immediate. PROCEDURE: The procedure, risks, benefits, and alternatives were explained  to the patient. Questions regarding the procedure were encouraged and answered. The patient understands and consents to the procedure. A timeout was performed prior to the initiation of the procedure. The left flank region as well as the external portions of the left-sided nephrostomy catheter as well as both perinephric drainage catheters were prepped with Betadine in a sterile fashion, and a sterile drape was applied covering the operative field. A sterile gown and sterile gloves were used for the procedure. Local anesthesia was provided with 1% Lidocaine with epinephrine. Preprocedural spot fluoroscopic image was  obtained of the left upper abdomen as well as the existing left-sided percutaneous nephrostomy catheter in both perinephric abscess drains. Contrast was injected via the existing perinephric abscess drainage catheters however failed to delineate a patent tract to the location of the perinephric abscesses. As such, both drainage catheters cut and intact. Contrast was then injected via the existing malposition nephrostomy catheter however also failed to delineate patency of nephrostomy tract. As such, the external portion of the nephrostomy catheter was cut and nephrostomy catheter was removed intact. Ultrasound was used to localize the left kidney. Under direct ultrasound guidance, a 21 gauge needle was advanced into the renal collecting system. An ultrasound image documentation was performed. Access within the collecting system was confirmed with the efflux of urine followed by contrast injection. A Nitrex wire was advanced into the left renal collecting system however there is difficulty advancing the Accustick over the Nitrex wire due to the depth of the kidney and associated patient body habitus. As such, a posteroinferior calyx, which was still opacified from contrast-enhanced CT scan performed earlier today, was targeted fluoroscopically with a 22 gauge Chiba needle. Appropriate access to the left renal collecting system was confirmed with injection contrast advancement of a Nitrex wire into the left renal collecting system. Under fluoroscopic guidance, the access needle was exchanged for an Accustick set was advanced to the level of the left renal pelvis. Contrast injection was performed. Over a short Amplatz wire, the track was dilated allowing placement of a 10 French percutaneous nephrostomy catheter with end coiled and locked left renal pelvis. A small amount of contrast was injected demonstrating appropriate position functionality of the nephrostomy catheter The nephrostomy catheter was secured to the  skin entrance site with 2 interrupted sutures and a Stat device. The nephrostomy with connected to a gravity bag a dressing was applied. The patient tolerated the procedure well without immediate postprocedural complication. FINDINGS: Fluoroscopic guided injection both perinephric drainage catheters demonstrates lack a residual subcutaneous track to the level of the known residual/recurrent perinephric abscesses. As such, both percutaneous drainage catheters were cut and removed intact. Fluoroscopic guided injection of the existing malposition of proximally catheter demonstrates lack of residual subcutaneous track to the level of the left renal system. As such, the pre-existing nephrostomy catheter was cut and removed intact. Ultimately, under fluoroscopic guidance, a dilated posteroinferior calyx (still opacified from contrast-enhanced CT scan performed earlier today), was targeted fluoroscopically allowing placement of a 10 French percutaneous nephrostomy catheter with end coiled and locked within the left renal pelvis. IMPRESSION: 1. Malpositioned left-sided percutaneous nephrostomy catheter requiring removal and definitive new nephrostomy catheter placement. 2. Successful fluoroscopic guided placement of a new 10.2 French left-sided percutaneous nephrostomy catheter. 3. Malpositioned left-sided perinephric abscess drainage catheters requiring new CT-guided perinephric abscess drainage catheter placement. PLAN: - The patient was escorted to CT to undergo replacement of both left-sided perinephric abscess drainage catheters. - Recommend IV only CT scan of the abdomen and  pelvis once output from both perinephric abscess drainage catheters is less than 10 cc per day (excluding flushes). Recommend drainage catheter injection prior to removal to exclude communication to the left renal collecting system. - Per my discussion with the patient's urologist (Dr. Alvester Morin) earlier today, we will plan on attempted antegrade  nephrostogram and placement of left-sided ureteral stent once patient recovers from the above procedures. Electronically Signed   By: Simonne Come M.D.   On: 04/02/2019 18:04   Ir Sinus/fist Tube Chk-non Gi  Result Date: 04/02/2019 INDICATION: Patient admitted on 03/29/2019 with sepsis found to have obstructing left-sided nephrolithiasis, for which the patient underwent image guided placement of a left-sided percutaneous drainage catheter and CT-guided placement of a perinephric abscess drainage catheter 03/22/2019. Patient underwent subsequent additional perinephric abscess drainage catheter placement on 03/28/2019. Unfortunately, CT scan performed earlier today demonstrates retraction of the percutaneous nephrostomy catheter outside the left renal collecting system as well as both perinephric drains outside of both perinephric fluid collections. EXAM: 1. ATTEMPTED NEPHROSTOGRAM VIA EXISTING NEPHROSTOMY CATHETER WITH SUBSEQUENT REMOVAL. 2. IMAGE GUIDED LEFT-SIDED PERCUTANEOUS NEPHROSTOMY CATHETER PLACEMENT 3. LEFT-SIDED PERINEPHRIC ABSCESS DRAINAGE CATHETER INJECTION X2 COMPARISON:  CT abdomen pelvis-earlier same day; 03/27/2019; 03/29/19; image guided left-sided percutaneous nephrostomy catheter placement-03/22/2019; CT-guided left-sided perinephric abscess drainage catheter placement-03/22/2019; 03/28/2019 MEDICATIONS: Patient is currently admitted to the hospital receiving intravenous antibiotics; the antibiotic was administered in an appropriate time frame prior to skin puncture. ANESTHESIA/SEDATION: Moderate (conscious) sedation was employed during this procedure. A total of Fentanyl 37.5 mcg was administered intravenously. Moderate Sedation Time: 20 minutes. The patient's level of consciousness and vital signs were monitored continuously by radiology nursing throughout the procedure under my direct supervision. CONTRAST:  20 cc mL Isovue 300 administered into the left collecting system as well as the  malpositioned left-sided percutaneous nephrostomy catheter as well as both perinephric abscess drainage catheters. FLUOROSCOPY TIME:  5 minutes, 42 seconds (146 mGy) COMPLICATIONS: None immediate. PROCEDURE: The procedure, risks, benefits, and alternatives were explained to the patient. Questions regarding the procedure were encouraged and answered. The patient understands and consents to the procedure. A timeout was performed prior to the initiation of the procedure. The left flank region as well as the external portions of the left-sided nephrostomy catheter as well as both perinephric drainage catheters were prepped with Betadine in a sterile fashion, and a sterile drape was applied covering the operative field. A sterile gown and sterile gloves were used for the procedure. Local anesthesia was provided with 1% Lidocaine with epinephrine. Preprocedural spot fluoroscopic image was obtained of the left upper abdomen as well as the existing left-sided percutaneous nephrostomy catheter in both perinephric abscess drains. Contrast was injected via the existing perinephric abscess drainage catheters however failed to delineate a patent tract to the location of the perinephric abscesses. As such, both drainage catheters cut and intact. Contrast was then injected via the existing malposition nephrostomy catheter however also failed to delineate patency of nephrostomy tract. As such, the external portion of the nephrostomy catheter was cut and nephrostomy catheter was removed intact. Ultrasound was used to localize the left kidney. Under direct ultrasound guidance, a 21 gauge needle was advanced into the renal collecting system. An ultrasound image documentation was performed. Access within the collecting system was confirmed with the efflux of urine followed by contrast injection. A Nitrex wire was advanced into the left renal collecting system however there is difficulty advancing the Accustick over the Nitrex wire due  to the depth of the kidney  and associated patient body habitus. As such, a posteroinferior calyx, which was still opacified from contrast-enhanced CT scan performed earlier today, was targeted fluoroscopically with a 22 gauge Chiba needle. Appropriate access to the left renal collecting system was confirmed with injection contrast advancement of a Nitrex wire into the left renal collecting system. Under fluoroscopic guidance, the access needle was exchanged for an Accustick set was advanced to the level of the left renal pelvis. Contrast injection was performed. Over a short Amplatz wire, the track was dilated allowing placement of a 10 French percutaneous nephrostomy catheter with end coiled and locked left renal pelvis. A small amount of contrast was injected demonstrating appropriate position functionality of the nephrostomy catheter The nephrostomy catheter was secured to the skin entrance site with 2 interrupted sutures and a Stat device. The nephrostomy with connected to a gravity bag a dressing was applied. The patient tolerated the procedure well without immediate postprocedural complication. FINDINGS: Fluoroscopic guided injection both perinephric drainage catheters demonstrates lack a residual subcutaneous track to the level of the known residual/recurrent perinephric abscesses. As such, both percutaneous drainage catheters were cut and removed intact. Fluoroscopic guided injection of the existing malposition of proximally catheter demonstrates lack of residual subcutaneous track to the level of the left renal system. As such, the pre-existing nephrostomy catheter was cut and removed intact. Ultimately, under fluoroscopic guidance, a dilated posteroinferior calyx (still opacified from contrast-enhanced CT scan performed earlier today), was targeted fluoroscopically allowing placement of a 10 French percutaneous nephrostomy catheter with end coiled and locked within the left renal pelvis. IMPRESSION: 1.  Malpositioned left-sided percutaneous nephrostomy catheter requiring removal and definitive new nephrostomy catheter placement. 2. Successful fluoroscopic guided placement of a new 10.2 French left-sided percutaneous nephrostomy catheter. 3. Malpositioned left-sided perinephric abscess drainage catheters requiring new CT-guided perinephric abscess drainage catheter placement. PLAN: - The patient was escorted to CT to undergo replacement of both left-sided perinephric abscess drainage catheters. - Recommend IV only CT scan of the abdomen and pelvis once output from both perinephric abscess drainage catheters is less than 10 cc per day (excluding flushes). Recommend drainage catheter injection prior to removal to exclude communication to the left renal collecting system. - Per my discussion with the patient's urologist (Dr. Alvester Morin) earlier today, we will plan on attempted antegrade nephrostogram and placement of left-sided ureteral stent once patient recovers from the above procedures. Electronically Signed   By: Simonne Come M.D.   On: 04/02/2019 18:04   Ct Image Guided Fluid Drain By Catheter  Result Date: 04/03/2019 INDICATION: Recent admission with urosepsis, post left-sided percutaneous nephrostomy catheter placement on 03/22/2019 as well as CT guided perinephric abscess drainage catheter placement on 03/22/2019 and again on 03/28/2019. Unfortunately, CT scan of the abdomen pelvis performed earlier today retraction of the nephrostomy catheter outside the left renal collecting system as well as retraction of both perinephric abscess drainage catheters outside the residual perinephric fluid collection/abscesses. Patient has undergone image guided placement of a new left-sided percutaneous nephrostomy catheter earlier today however attempted fluoroscopic guided replacement of the perinephric drains was not possible due to lack of a subcutaneous track. As such, patient now presents for CT guided replacement of  perinephric abscess drainage catheters. EXAM: CT IMAGE GUIDED FLUID DRAIN BY CATHETER x2 COMPARISON:  Image guided percutaneous nephrostomy catheter placement-03/22/2019; earlier same day; CT-guided percutaneous drainage catheter placement-03/22/2019; 03/28/2019; CT abdomen pelvis-earlier same day; 03/27/2019; 03/29/2019 MEDICATIONS: The patient is currently admitted to the hospital and receiving intravenous antibiotics. The antibiotics were administered  within an appropriate time frame prior to the initiation of the procedure. ANESTHESIA/SEDATION: None - the patient was still sedated from medications administered during preceding nephrostomy placement CONTRAST:  None COMPLICATIONS: None immediate. PROCEDURE: Informed written consent was obtained from the patient after a discussion of the risks, benefits and alternatives to treatment. The patient was placed supine, slightly RPO on the CT gantry and a pre procedural CT was performed re-demonstrating the known abscess/fluid collections within the left perinephric space with dominant collection about the posteromedial aspect the left kidney measuring approximately 4.4 by 1.8 cm (image 59, series 3 and additional bilobed collection about the more caudal aspect of the left kidney measuring approximately 7.7 x 3.0 cm (image 63, series 2). The procedure was planned. A timeout was performed prior to the initiation of the procedure. The skin overlying the left lateral abdomen was prepped and draped in the usual sterile fashion. The overlying soft tissues were anesthetized with 1% lidocaine with epinephrine. Appropriate trajectory was planned with the use of a 22 gauge spinal needles. Next, 18 gauge trocar needles were advanced into both perinephric abscess fluid collections under intermittent CT guidance. Short Amplatz wires were coiled within both collections. Appropriate positioning was confirmed with a limited CT scan. The tracks were serially dilated allowing placement  of 10.2 French drainage catheters into both residual/recurrent perinephric fluid collections. The dominant caudal collection yielded approximately 10 cc of purulent material while the smaller collection yielded approximately 5 cc of purulent material. Both drainage catheters were flushed with a small amount of saline and connected to JP bulbs, sutured in place. Dressings and StatLock devices were applied. The patient tolerated the procedure well without immediate post procedural complication. IMPRESSION: Successful CT guided placement of a 10 French all purpose drain catheter into residual/recurrent perinephric abscesses with dominant collection yielding 10 cc of purulent fluid and smaller collection yielding 5 cc of purulent fluid. PLAN: - Recommend IV only CT scan the abdomen pelvis 1 output from both perinephric abscess drainage catheters is less than 10 cc per day (excluding flush). - Recommend drainage catheter injection prior to removal to exclude communication to the left renal collecting system. Electronically Signed   By: Simonne ComeJohn  Watts M.D.   On: 04/03/2019 07:54   Ct Image Guided Fluid Drain By Catheter  Result Date: 04/03/2019 INDICATION: Recent admission with urosepsis, post left-sided percutaneous nephrostomy catheter placement on 03/22/2019 as well as CT guided perinephric abscess drainage catheter placement on 03/22/2019 and again on 03/28/2019. Unfortunately, CT scan of the abdomen pelvis performed earlier today retraction of the nephrostomy catheter outside the left renal collecting system as well as retraction of both perinephric abscess drainage catheters outside the residual perinephric fluid collection/abscesses. Patient has undergone image guided placement of a new left-sided percutaneous nephrostomy catheter earlier today however attempted fluoroscopic guided replacement of the perinephric drains was not possible due to lack of a subcutaneous track. As such, patient now presents for CT  guided replacement of perinephric abscess drainage catheters. EXAM: CT IMAGE GUIDED FLUID DRAIN BY CATHETER x2 COMPARISON:  Image guided percutaneous nephrostomy catheter placement-03/22/2019; earlier same day; CT-guided percutaneous drainage catheter placement-03/22/2019; 03/28/2019; CT abdomen pelvis-earlier same day; 03/27/2019; 04/15/2019 MEDICATIONS: The patient is currently admitted to the hospital and receiving intravenous antibiotics. The antibiotics were administered within an appropriate time frame prior to the initiation of the procedure. ANESTHESIA/SEDATION: None - the patient was still sedated from medications administered during preceding nephrostomy placement CONTRAST:  None COMPLICATIONS: None immediate. PROCEDURE: Informed written consent was obtained  from the patient after a discussion of the risks, benefits and alternatives to treatment. The patient was placed supine, slightly RPO on the CT gantry and a pre procedural CT was performed re-demonstrating the known abscess/fluid collections within the left perinephric space with dominant collection about the posteromedial aspect the left kidney measuring approximately 4.4 by 1.8 cm (image 59, series 3 and additional bilobed collection about the more caudal aspect of the left kidney measuring approximately 7.7 x 3.0 cm (image 63, series 2). The procedure was planned. A timeout was performed prior to the initiation of the procedure. The skin overlying the left lateral abdomen was prepped and draped in the usual sterile fashion. The overlying soft tissues were anesthetized with 1% lidocaine with epinephrine. Appropriate trajectory was planned with the use of a 22 gauge spinal needles. Next, 18 gauge trocar needles were advanced into both perinephric abscess fluid collections under intermittent CT guidance. Short Amplatz wires were coiled within both collections. Appropriate positioning was confirmed with a limited CT scan. The tracks were serially  dilated allowing placement of 10.2 French drainage catheters into both residual/recurrent perinephric fluid collections. The dominant caudal collection yielded approximately 10 cc of purulent material while the smaller collection yielded approximately 5 cc of purulent material. Both drainage catheters were flushed with a small amount of saline and connected to JP bulbs, sutured in place. Dressings and StatLock devices were applied. The patient tolerated the procedure well without immediate post procedural complication. IMPRESSION: Successful CT guided placement of a 10 French all purpose drain catheter into residual/recurrent perinephric abscesses with dominant collection yielding 10 cc of purulent fluid and smaller collection yielding 5 cc of purulent fluid. PLAN: - Recommend IV only CT scan the abdomen pelvis 1 output from both perinephric abscess drainage catheters is less than 10 cc per day (excluding flush). - Recommend drainage catheter injection prior to removal to exclude communication to the left renal collecting system. Electronically Signed   By: Sandi Mariscal M.D.   On: 04/03/2019 07:54        Scheduled Meds:  amiodarone  200 mg Oral Daily   famotidine  20 mg Oral BID   feeding supplement (PRO-STAT SUGAR FREE 64)  30 mL Oral BID WC   gabapentin  400 mg Oral TID   midodrine  10 mg Oral TID WC   multivitamin with minerals  1 tablet Oral Daily   nutrition supplement (JUVEN)  1 packet Oral BID BM   potassium chloride  20 mEq Oral Daily   Ensure Max Protein  11 oz Oral QHS   sodium chloride flush  3 mL Intravenous Q12H   sodium chloride flush  5 mL Intracatheter Q8H   umeclidinium bromide  1 puff Inhalation Daily   Continuous Infusions:  cefTRIAXone (ROCEPHIN)  IV 2 g (04/03/19 1630)   heparin 2,050 Units/hr (04/04/19 0115)     LOS: 13 days    Time spent: 25 minutes    Barb Merino, MD [facility] Pager [phone]

## 2019-04-04 NOTE — Progress Notes (Signed)
Referring Physician(s): Dr Corrie Mckusick  Supervising Physician: Gilmer Mor  Patient Status:  [facility] - In-pt  Chief Complaint:  Hydronephrosis; Perinephric abscesses  Subjective:  Procedure 04/02/19: Successful [place] and fluoroscopic guided placement of a left sided PCN with end coiled and locked within the renal pelvis. PCN connected to gravity bag.  Technically successful CT guided placed of a 10 Fr drainage catheter placement x2 into two separate perinephric abscess yielding 10 cc and 5 cc of bloody purulent fluid respectively. Both perinephric drains placed to JP bulbs.  Ouputs still is minimal per recordings All flush easily per RN  Pt without complaints of pain  Denies fever   Allergies: Nsaids, Morphine and related, and Vicodin [hydrocodone-acetaminophen]  Medications: Prior to Admission medications   Medication Sig Start Date End Date Taking? Authorizing Provider  acetaminophen (TYLENOL) 500 MG tablet Take 2 tablets (1,000 mg total) by mouth every 8 (eight) hours. Patient taking differently: Take 1,000 mg by mouth See admin instructions. Take 1,000 mg by mouth every eight hours and do not exceed 4,000 mg/24 hours from all combined sources 01/24/17  Yes Babish, Molli Hazard, [name]  albuterol (PROVENTIL HFA;VENTOLIN HFA) 108 (90 Base) MCG/ACT inhaler Inhale 2 puffs into the lungs every 4 (four) hours as needed for wheezing or shortness of breath.    Yes [provider]  Amino Acids-Protein Hydrolys (FEEDING SUPPLEMENT, PRO-STAT SUGAR FREE 64,) LIQD Take 30 mLs by mouth 3 (three) times daily.    Yes [provider]  amiodarone (PACERONE) 200 MG tablet TAKE ONE TABLET BY MOUTH ONCE DAILY Patient taking differently: Take 200 mg by mouth daily.  10/29/16  Yes Hilty, Lisette Abu, MD  Artificial Saliva (BIOTENE DRY MOUTH MOISTURIZING) SOLN 1 spray by Mouth Rinse route every 2 (two) hours as needed (for dryness).   Yes [provider]  bumetanide (BUMEX) 1 MG  tablet Take 1 tablet (1 mg total) by mouth 2 (two) times daily. 10/15/16  Yes Hilty, Lisette Abu, MD  cetirizine (ZYRTEC) 10 MG tablet Take 10 mg by mouth daily.    Yes [provider]  Cholecalciferol (VITAMIN D-3) 25 MCG (1000 UT) CAPS Take 1,000 Units by mouth daily.   Yes [provider]  docusate sodium 100 MG CAPS Take 100 mg by mouth 2 (two) times daily. 08/15/13  Yes Babish, Molli Hazard, [name]  famotidine (PEPCID) 20 MG tablet Take 1 tablet (20 mg total) by mouth 2 (two) times daily. 02/21/17  Yes Tyrone Nine, MD  ferrous sulfate (FERROUSUL) 325 (65 FE) MG tablet Take 1 tablet (325 mg total) by mouth 3 (three) times daily with meals. Patient taking differently: Take 325 mg by mouth 2 (two) times daily with a meal.  01/24/17  Yes Babish, Molli Hazard, [name]  gabapentin (NEURONTIN) 400 MG capsule TAKE 1 CAPSULE BY MOUTH THREE TIMES DAILY Patient taking differently: Take 400 mg by mouth 3 (three) times daily.  01/14/17  Yes [name], MD  midodrine (PROAMATINE) 10 MG tablet Take 1 tablet (10 mg total) by mouth 3 (three) times daily. 05/28/17  Yes Tyrone Nine, MD  Multiple Vitamins-Minerals (CERTAGEN PO) Take 1 tablet by mouth daily.    Yes [provider]  Multiple Vitamins-Minerals (PRESERVISION AREDS 2) CAPS Take 1 capsule by mouth 2 (two) times daily.   Yes [provider]  nutrition supplement, JUVEN, (JUVEN) PACK Take 1 packet by mouth 2 (two) times daily between meals. 02/21/17  Yes Tyrone Nine, MD  Nutritional Supplements (RESOURCE 2.0)  LIQD Take 120 mLs by mouth at bedtime.   Yes [provider]  OXYGEN Inhale 2 L/min into the lungs as needed (for shortness of breath).    Yes [provider]  polyethylene glycol (MIRALAX / GLYCOLAX) packet Take 17 g by mouth 2 (two) times daily. Patient taking differently: Take 17 g by mouth See admin instructions. Mix 17 grams into 4-8 ounces of liquid and drink two times a day 01/24/17  Yes Babish,  Molli Hazard, [name]  potassium chloride (KLOR-CON) 20 MEQ packet Take 20 mEq by mouth daily.   Yes [provider]  PRESCRIPTION MEDICATION BiPAP: At bedtime   Yes [provider]  rivaroxaban (XARELTO) 20 MG TABS tablet Take 1 tablet (20 mg total) by mouth daily. 08/05/15  Yes Hilty, Lisette Abu, MD  rosuvastatin (CRESTOR) 20 MG tablet TAKE ONE TABLET BY MOUTH ONCE A WEEK, FRIDAY Patient taking differently: Take 20 mg by mouth every Friday.  12/24/16  Yes Hilty, Lisette Abu, MD  simethicone (MYLICON) 125 MG chewable tablet Chew 125 mg by mouth 3 (three) times daily. FOR 5 DAYS 04/08/2019 03/26/19 Yes [provider]  SPIRIVA HANDIHALER 18 MCG inhalation capsule INHALE ONE PUFF BY MOUTH ONCE DAILY Patient taking differently: Place 18 mcg into inhaler and inhale daily.  03/18/16  Yes [name], MD  tamsulosin (FLOMAX) 0.4 MG CAPS capsule TAKE 1 CAPSULE BY MOUTH ONCE DAILY Patient taking differently: Take 0.4 mg by mouth daily.  12/24/16  Yes [name], MD  traMADol (ULTRAM) 50 MG tablet Take 1-2 tablets (50-100 mg total) by mouth every 12 (twelve) hours as needed for moderate pain or severe pain (and before hydrotherapy). Patient taking differently: Take 50 mg by mouth at bedtime.  05/28/17  Yes Tyrone Nine, MD  Magnesium 250 MG TABS Take 250 mg by mouth daily.     [provider]  Melatonin 3 MG CAPS Take 3 mg by mouth as needed (insomnia).    [provider]  Multiple Vitamin (MULTIVITAMIN WITH MINERALS) TABS tablet Take 1 tablet by mouth daily.    [provider]  protein supplement shake (PREMIER PROTEIN) LIQD Take 325 mLs (11 oz total) by mouth daily. Patient not taking: Reported on 04/12/2019 02/21/17   Tyrone Nine, MD     Vital Signs: BP (!) 95/53    Pulse 71    Temp 98.4 F (36.9 C) (Oral)    Resp 20    Ht  (1.778 m)    Wt 219 lb (99.3 kg)    SpO2 95%    BMI 31.42 kg/m   Physical Exam Skin:    General: Skin is warm  and dry.     Comments: Sites atre clean and dry NT no bleeding OP on all drains are serous to cloudy yellow 10-20 cc per drain daily  11/11: Organism ID, Bacteria PROTEUS MIRABILIS      Imaging: Ct Abdomen Pelvis W Contrast  Result Date: 04/02/2019 CLINICAL DATA:  Patient admitted with sepsis found to have a left-sided obstructing pelvic stone with hydronephrosis and associated perinephric fluid collections for which the patient underwent image guided placement of a left-sided percutaneous nephrostomy catheter on 03/22/2019 as well as CT-guided placement of a perinephric fluid collection on both 03/22/2019 as well as 03/28/2019. Repeat CT imaging is performed secondary to lack of output from the percutaneous drainage catheters. EXAM: CT ABDOMEN AND PELVIS WITH CONTRAST TECHNIQUE: Multidetector CT imaging of the abdomen and pelvis was performed using  the standard protocol following bolus administration of intravenous contrast. CONTRAST:  OMNIPAQUE IOHEXOL 300 MG/ML  SOLN COMPARISON:  CT abdomen and pelvis-04/04/2019; image guided left-sided percutaneous nephrostomy catheter placement-03/22/2019; CT-guided percutaneous catheter placement-03/22/2019; 03/28/2019 FINDINGS: Lower chest: Limited visualization of the lower thorax demonstrates interval increase in small left and trace right-sided pleural effusion with associated worsening bibasilar consolidative opacities. Cardiomegaly. Coronary artery calcifications. Calcifications of the aortic valve root. No pericardial effusion. Hepatobiliary: Normal hepatic contour. There is a punctate subcentimeter hypoattenuating lesion within the right lobe of the liver (image 18, series 3), which is too small to adequately characterize of favored to represent a hepatic cyst. No discrete worrisome hepatic lesions. Normal appearance of the gallbladder given underdistention. No intra or extrahepatic biliary duct dilatation. No ascites. Pancreas: The pancreas appears  somewhat atrophic but otherwise normal. Spleen: Normal appearance of the spleen. Note is made of a small splenule. Adrenals/Urinary Tract: There is slightly delayed enhancement and excretion of the left kidney in comparison to the right. Interval retraction of left-sided percutaneous nephrostomy catheter external to the left renal collecting system with development of recurrent moderate left-sided pelvicaliectasis Interval retraction of both left-sided perinephric abscess drainage catheters with Re Q malacia of perinephric fluid with dominant collection about posteromedial aspect of the left kidney measuring approximately 4.5 x 2.1 cm and bilobed collection about the more inferior aspect of the left kidney measuring approximately 7.6 x 3.0 cm, similar to presentation CT scan performed 04/09/2019. Redemonstrated clustered stones within the left renal pelvis and superior pole of the left kidney. No evidence of right-sided nephrolithiasis or urinary obstruction. Redemonstrated approximately 4.1 x 3.2 cm hypoattenuating nonenhancing minimally complex cyst arising from the superomedial aspect the right kidney (image 32, series 3; image 8, series 8). No discrete right-sided renal lesions. Normal appearance of the bilateral adrenal glands. There is thickening involving the lateral aspects of the urinary bladder wall. Stomach/Bowel: Moderate to large colonic stool burden without evidence of enteric obstruction. Normal appearance of the terminal ileum and appendix. No pneumoperitoneum, pneumatosis or portal venous gas. Vascular/Lymphatic: Probably there is a large calcified atherosclerotic amount of irregular plaque through a tortuous but normal caliber abdominal aorta. The major branch vessels of the abdominal aorta, including the IMA, appear patent on this non CTA examination. No bulky retroperitoneal, mesenteric, pelvic or inguinal lymphadenopathy. Reproductive: Dystrophic calcifications within normal sized prostate  gland. No free fluid the pelvic cul-de-sac. Other: Mild diffuse body wall anasarca. Tiny mesenteric fat containing periumbilical hernia. Large (at least 7.2 x 3.6 x 9.8 mesenteric fat containing indirect right-sided inguinal hernia. Musculoskeletal: Redemonstrates sacral wound with associated absence/bony erosion of the distal end of the sacrum and partial absence of the coccyx (image 32, series 7). Post left total hip replacement. Unchanged severe (approximately 75%) L3 compression deformity, similar to presentation CT scan performed 04/02/2019. IMPRESSION: 1. Interval retraction of left-sided percutaneous nephrostomy catheter outside the left renal collecting system with recurrent moderate left-sided pelvicaliectasis. 2. Interval retraction/malpositioning of both perinephric abscess drainage catheters with recurrence of bilateral perinephric fluid collections. 3. Unchanged left-sided nephrolithiasis. 4. Slight increase in small left and trace right-sided pleural effusions. 5. Redemonstrated sacral wound with associated bony erosion involving the distal tip of the sacrum and coccyx. 6. Unchanged severe (approximately 75%) L3 compression deformity with persistent fracture line. Correlation for point tenderness at this location is advised. Further evaluation with lumbar spine MRI could performed as indicated. 7. Moderate to large colonic stool burden without evidence of enteric obstruction. 8.  Aortic Atherosclerosis (ICD10-I70.0).  Electronically Signed   By: Simonne Come M.D.   On: 04/02/2019 16:51   Ir Sinus/fist Tube Chk-non Gi  Result Date: 04/02/2019 INDICATION: Patient admitted on 23-Mar-2019 with sepsis found to have obstructing left-sided nephrolithiasis, for which the patient underwent image guided placement of a left-sided percutaneous drainage catheter and CT-guided placement of a perinephric abscess drainage catheter 03/22/2019. Patient underwent subsequent additional perinephric abscess drainage  catheter placement on 03/28/2019. Unfortunately, CT scan performed earlier today demonstrates retraction of the percutaneous nephrostomy catheter outside the left renal collecting system as well as both perinephric drains outside of both perinephric fluid collections. EXAM: 1. ATTEMPTED NEPHROSTOGRAM VIA EXISTING NEPHROSTOMY CATHETER WITH SUBSEQUENT REMOVAL. 2. IMAGE GUIDED LEFT-SIDED PERCUTANEOUS NEPHROSTOMY CATHETER PLACEMENT 3. LEFT-SIDED PERINEPHRIC ABSCESS DRAINAGE CATHETER INJECTION X2 COMPARISON:  CT abdomen pelvis-earlier same day; 03/27/2019; 03/23/19; image guided left-sided percutaneous nephrostomy catheter placement-03/22/2019; CT-guided left-sided perinephric abscess drainage catheter placement-03/22/2019; 03/28/2019 MEDICATIONS: Patient is currently admitted to the hospital receiving intravenous antibiotics; the antibiotic was administered in an appropriate time frame prior to skin puncture. ANESTHESIA/SEDATION: Moderate (conscious) sedation was employed during this procedure. A total of Fentanyl 37.5 mcg was administered intravenously. Moderate Sedation Time: 20 minutes. The patient's level of consciousness and vital signs were monitored continuously by radiology nursing throughout the procedure under my direct supervision. CONTRAST:  20 cc mL Isovue 300 administered into the left collecting system as well as the malpositioned left-sided percutaneous nephrostomy catheter as well as both perinephric abscess drainage catheters. FLUOROSCOPY TIME:  5 minutes, 42 seconds (146 mGy) COMPLICATIONS: None immediate. PROCEDURE: The procedure, risks, benefits, and alternatives were explained to the patient. Questions regarding the procedure were encouraged and answered. The patient understands and consents to the procedure. A timeout was performed prior to the initiation of the procedure. The left flank region as well as the external portions of the left-sided nephrostomy catheter as well as both perinephric  drainage catheters were prepped with Betadine in a sterile fashion, and a sterile drape was applied covering the operative field. A sterile gown and sterile gloves were used for the procedure. Local anesthesia was provided with 1% Lidocaine with epinephrine. Preprocedural spot fluoroscopic image was obtained of the left upper abdomen as well as the existing left-sided percutaneous nephrostomy catheter in both perinephric abscess drains. Contrast was injected via the existing perinephric abscess drainage catheters however failed to delineate a patent tract to the location of the perinephric abscesses. As such, both drainage catheters cut and intact. Contrast was then injected via the existing malposition nephrostomy catheter however also failed to delineate patency of nephrostomy tract. As such, the external portion of the nephrostomy catheter was cut and nephrostomy catheter was removed intact. Ultrasound was used to localize the left kidney. Under direct ultrasound guidance, a 21 gauge needle was advanced into the renal collecting system. An ultrasound image documentation was performed. Access within the collecting system was confirmed with the efflux of urine followed by contrast injection. A Nitrex wire was advanced into the left renal collecting system however there is difficulty advancing the Accustick over the Nitrex wire due to the depth of the kidney and associated patient body habitus. As such, a posteroinferior calyx, which was still opacified from contrast-enhanced CT scan performed earlier today, was targeted fluoroscopically with a 22 gauge Chiba needle. Appropriate access to the left renal collecting system was confirmed with injection contrast advancement of a Nitrex wire into the left renal collecting system. Under fluoroscopic guidance, the access needle was exchanged for an Accustick set was  advanced to the level of the left renal pelvis. Contrast injection was performed. Over a short Amplatz wire,  the track was dilated allowing placement of a 10 French percutaneous nephrostomy catheter with end coiled and locked left renal pelvis. A small amount of contrast was injected demonstrating appropriate position functionality of the nephrostomy catheter The nephrostomy catheter was secured to the skin entrance site with 2 interrupted sutures and a Stat device. The nephrostomy with connected to a gravity bag a dressing was applied. The patient tolerated the procedure well without immediate postprocedural complication. FINDINGS: Fluoroscopic guided injection both perinephric drainage catheters demonstrates lack a residual subcutaneous track to the level of the known residual/recurrent perinephric abscesses. As such, both percutaneous drainage catheters were cut and removed intact. Fluoroscopic guided injection of the existing malposition of proximally catheter demonstrates lack of residual subcutaneous track to the level of the left renal system. As such, the pre-existing nephrostomy catheter was cut and removed intact. Ultimately, under fluoroscopic guidance, a dilated posteroinferior calyx (still opacified from contrast-enhanced CT scan performed earlier today), was targeted fluoroscopically allowing placement of a 10 French percutaneous nephrostomy catheter with end coiled and locked within the left renal pelvis. IMPRESSION: 1. Malpositioned left-sided percutaneous nephrostomy catheter requiring removal and definitive new nephrostomy catheter placement. 2. Successful fluoroscopic guided placement of a new 10.2 French left-sided percutaneous nephrostomy catheter. 3. Malpositioned left-sided perinephric abscess drainage catheters requiring new CT-guided perinephric abscess drainage catheter placement. PLAN: - The patient was escorted to CT to undergo replacement of both left-sided perinephric abscess drainage catheters. - Recommend IV only CT scan of the abdomen and pelvis once output from both perinephric abscess  drainage catheters is less than 10 cc per day (excluding flushes). Recommend drainage catheter injection prior to removal to exclude communication to the left renal collecting system. - Per my discussion with the patient's urologist (Dr. Alvester Morin) earlier today, we will plan on attempted antegrade nephrostogram and placement of left-sided ureteral stent once patient recovers from the above procedures. Electronically Signed   By: Simonne Come M.D.   On: 04/02/2019 18:04   Ir Sinus/fist Tube Chk-non Gi  Result Date: 04/02/2019 INDICATION: Patient admitted on 13-Apr-2019 with sepsis found to have obstructing left-sided nephrolithiasis, for which the patient underwent image guided placement of a left-sided percutaneous drainage catheter and CT-guided placement of a perinephric abscess drainage catheter 03/22/2019. Patient underwent subsequent additional perinephric abscess drainage catheter placement on 03/28/2019. Unfortunately, CT scan performed earlier today demonstrates retraction of the percutaneous nephrostomy catheter outside the left renal collecting system as well as both perinephric drains outside of both perinephric fluid collections. EXAM: 1. ATTEMPTED NEPHROSTOGRAM VIA EXISTING NEPHROSTOMY CATHETER WITH SUBSEQUENT REMOVAL. 2. IMAGE GUIDED LEFT-SIDED PERCUTANEOUS NEPHROSTOMY CATHETER PLACEMENT 3. LEFT-SIDED PERINEPHRIC ABSCESS DRAINAGE CATHETER INJECTION X2 COMPARISON:  CT abdomen pelvis-earlier same day; 03/27/2019; 04/14/2019; image guided left-sided percutaneous nephrostomy catheter placement-03/22/2019; CT-guided left-sided perinephric abscess drainage catheter placement-03/22/2019; 03/28/2019 MEDICATIONS: Patient is currently admitted to the hospital receiving intravenous antibiotics; the antibiotic was administered in an appropriate time frame prior to skin puncture. ANESTHESIA/SEDATION: Moderate (conscious) sedation was employed during this procedure. A total of Fentanyl 37.5 mcg was administered  intravenously. Moderate Sedation Time: 20 minutes. The patient's level of consciousness and vital signs were monitored continuously by radiology nursing throughout the procedure under my direct supervision. CONTRAST:  20 cc mL Isovue 300 administered into the left collecting system as well as the malpositioned left-sided percutaneous nephrostomy catheter as well as both perinephric abscess drainage catheters. FLUOROSCOPY TIME:  5  minutes, 42 seconds (146 mGy) COMPLICATIONS: None immediate. PROCEDURE: The procedure, risks, benefits, and alternatives were explained to the patient. Questions regarding the procedure were encouraged and answered. The patient understands and consents to the procedure. A timeout was performed prior to the initiation of the procedure. The left flank region as well as the external portions of the left-sided nephrostomy catheter as well as both perinephric drainage catheters were prepped with Betadine in a sterile fashion, and a sterile drape was applied covering the operative field. A sterile gown and sterile gloves were used for the procedure. Local anesthesia was provided with 1% Lidocaine with epinephrine. Preprocedural spot fluoroscopic image was obtained of the left upper abdomen as well as the existing left-sided percutaneous nephrostomy catheter in both perinephric abscess drains. Contrast was injected via the existing perinephric abscess drainage catheters however failed to delineate a patent tract to the location of the perinephric abscesses. As such, both drainage catheters cut and intact. Contrast was then injected via the existing malposition nephrostomy catheter however also failed to delineate patency of nephrostomy tract. As such, the external portion of the nephrostomy catheter was cut and nephrostomy catheter was removed intact. Ultrasound was used to localize the left kidney. Under direct ultrasound guidance, a 21 gauge needle was advanced into the renal collecting system.  An ultrasound image documentation was performed. Access within the collecting system was confirmed with the efflux of urine followed by contrast injection. A Nitrex wire was advanced into the left renal collecting system however there is difficulty advancing the Accustick over the Nitrex wire due to the depth of the kidney and associated patient body habitus. As such, a posteroinferior calyx, which was still opacified from contrast-enhanced CT scan performed earlier today, was targeted fluoroscopically with a 22 gauge Chiba needle. Appropriate access to the left renal collecting system was confirmed with injection contrast advancement of a Nitrex wire into the left renal collecting system. Under fluoroscopic guidance, the access needle was exchanged for an Accustick set was advanced to the level of the left renal pelvis. Contrast injection was performed. Over a short Amplatz wire, the track was dilated allowing placement of a 10 French percutaneous nephrostomy catheter with end coiled and locked left renal pelvis. A small amount of contrast was injected demonstrating appropriate position functionality of the nephrostomy catheter The nephrostomy catheter was secured to the skin entrance site with 2 interrupted sutures and a Stat device. The nephrostomy with connected to a gravity bag a dressing was applied. The patient tolerated the procedure well without immediate postprocedural complication. FINDINGS: Fluoroscopic guided injection both perinephric drainage catheters demonstrates lack a residual subcutaneous track to the level of the known residual/recurrent perinephric abscesses. As such, both percutaneous drainage catheters were cut and removed intact. Fluoroscopic guided injection of the existing malposition of proximally catheter demonstrates lack of residual subcutaneous track to the level of the left renal system. As such, the pre-existing nephrostomy catheter was cut and removed intact. Ultimately, under  fluoroscopic guidance, a dilated posteroinferior calyx (still opacified from contrast-enhanced CT scan performed earlier today), was targeted fluoroscopically allowing placement of a 10 French percutaneous nephrostomy catheter with end coiled and locked within the left renal pelvis. IMPRESSION: 1. Malpositioned left-sided percutaneous nephrostomy catheter requiring removal and definitive new nephrostomy catheter placement. 2. Successful fluoroscopic guided placement of a new 10.2 French left-sided percutaneous nephrostomy catheter. 3. Malpositioned left-sided perinephric abscess drainage catheters requiring new CT-guided perinephric abscess drainage catheter placement. PLAN: - The patient was escorted to CT to undergo replacement  of both left-sided perinephric abscess drainage catheters. - Recommend IV only CT scan of the abdomen and pelvis once output from both perinephric abscess drainage catheters is less than 10 cc per day (excluding flushes). Recommend drainage catheter injection prior to removal to exclude communication to the left renal collecting system. - Per my discussion with the patient's urologist (Dr. Alvester MorinBell) earlier today, we will plan on attempted antegrade nephrostogram and placement of left-sided ureteral stent once patient recovers from the above procedures. Electronically Signed   By: Simonne ComeJohn  Watts M.D.   On: 04/02/2019 18:04   Ct Image Guided Fluid Drain By Catheter  Result Date: 04/03/2019 INDICATION: Recent admission with urosepsis, post left-sided percutaneous nephrostomy catheter placement on 03/22/2019 as well as CT guided perinephric abscess drainage catheter placement on 03/22/2019 and again on 03/28/2019. Unfortunately, CT scan of the abdomen pelvis performed earlier today retraction of the nephrostomy catheter outside the left renal collecting system as well as retraction of both perinephric abscess drainage catheters outside the residual perinephric fluid collection/abscesses. Patient  has undergone image guided placement of a new left-sided percutaneous nephrostomy catheter earlier today however attempted fluoroscopic guided replacement of the perinephric drains was not possible due to lack of a subcutaneous track. As such, patient now presents for CT guided replacement of perinephric abscess drainage catheters. EXAM: CT IMAGE GUIDED FLUID DRAIN BY CATHETER x2 COMPARISON:  Image guided percutaneous nephrostomy catheter placement-03/22/2019; earlier same day; CT-guided percutaneous drainage catheter placement-03/22/2019; 03/28/2019; CT abdomen pelvis-earlier same day; 03/27/2019; Aug 14, 2018 MEDICATIONS: The patient is currently admitted to the hospital and receiving intravenous antibiotics. The antibiotics were administered within an appropriate time frame prior to the initiation of the procedure. ANESTHESIA/SEDATION: None - the patient was still sedated from medications administered during preceding nephrostomy placement CONTRAST:  None COMPLICATIONS: None immediate. PROCEDURE: Informed written consent was obtained from the patient after a discussion of the risks, benefits and alternatives to treatment. The patient was placed supine, slightly RPO on the CT gantry and a pre procedural CT was performed re-demonstrating the known abscess/fluid collections within the left perinephric space with dominant collection about the posteromedial aspect the left kidney measuring approximately 4.4 by 1.8 cm (image 59, series 3 and additional bilobed collection about the more caudal aspect of the left kidney measuring approximately 7.7 x 3.0 cm (image 63, series 2). The procedure was planned. A timeout was performed prior to the initiation of the procedure. The skin overlying the left lateral abdomen was prepped and draped in the usual sterile fashion. The overlying soft tissues were anesthetized with 1% lidocaine with epinephrine. Appropriate trajectory was planned with the use of a 22 gauge spinal needles.  Next, 18 gauge trocar needles were advanced into both perinephric abscess fluid collections under intermittent CT guidance. Short Amplatz wires were coiled within both collections. Appropriate positioning was confirmed with a limited CT scan. The tracks were serially dilated allowing placement of 10.2 French drainage catheters into both residual/recurrent perinephric fluid collections. The dominant caudal collection yielded approximately 10 cc of purulent material while the smaller collection yielded approximately 5 cc of purulent material. Both drainage catheters were flushed with a small amount of saline and connected to JP bulbs, sutured in place. Dressings and StatLock devices were applied. The patient tolerated the procedure well without immediate post procedural complication. IMPRESSION: Successful CT guided placement of a 10 French all purpose drain catheter into residual/recurrent perinephric abscesses with dominant collection yielding 10 cc of purulent fluid and smaller collection yielding 5 cc of purulent fluid.  PLAN: - Recommend IV only CT scan the abdomen pelvis 1 output from both perinephric abscess drainage catheters is less than 10 cc per day (excluding flush). - Recommend drainage catheter injection prior to removal to exclude communication to the left renal collecting system. Electronically Signed   By: Simonne Come M.D.   On: 04/03/2019 07:54   Ct Image Guided Fluid Drain By Catheter  Result Date: 04/03/2019 INDICATION: Recent admission with urosepsis, post left-sided percutaneous nephrostomy catheter placement on 03/22/2019 as well as CT guided perinephric abscess drainage catheter placement on 03/22/2019 and again on 03/28/2019. Unfortunately, CT scan of the abdomen pelvis performed earlier today retraction of the nephrostomy catheter outside the left renal collecting system as well as retraction of both perinephric abscess drainage catheters outside the residual perinephric fluid  collection/abscesses. Patient has undergone image guided placement of a new left-sided percutaneous nephrostomy catheter earlier today however attempted fluoroscopic guided replacement of the perinephric drains was not possible due to lack of a subcutaneous track. As such, patient now presents for CT guided replacement of perinephric abscess drainage catheters. EXAM: CT IMAGE GUIDED FLUID DRAIN BY CATHETER x2 COMPARISON:  Image guided percutaneous nephrostomy catheter placement-03/22/2019; earlier same day; CT-guided percutaneous drainage catheter placement-03/22/2019; 03/28/2019; CT abdomen pelvis-earlier same day; 03/27/2019; Apr 19, 2019 MEDICATIONS: The patient is currently admitted to the hospital and receiving intravenous antibiotics. The antibiotics were administered within an appropriate time frame prior to the initiation of the procedure. ANESTHESIA/SEDATION: None - the patient was still sedated from medications administered during preceding nephrostomy placement CONTRAST:  None COMPLICATIONS: None immediate. PROCEDURE: Informed written consent was obtained from the patient after a discussion of the risks, benefits and alternatives to treatment. The patient was placed supine, slightly RPO on the CT gantry and a pre procedural CT was performed re-demonstrating the known abscess/fluid collections within the left perinephric space with dominant collection about the posteromedial aspect the left kidney measuring approximately 4.4 by 1.8 cm (image 59, series 3 and additional bilobed collection about the more caudal aspect of the left kidney measuring approximately 7.7 x 3.0 cm (image 63, series 2). The procedure was planned. A timeout was performed prior to the initiation of the procedure. The skin overlying the left lateral abdomen was prepped and draped in the usual sterile fashion. The overlying soft tissues were anesthetized with 1% lidocaine with epinephrine. Appropriate trajectory was planned with the use of  a 22 gauge spinal needles. Next, 18 gauge trocar needles were advanced into both perinephric abscess fluid collections under intermittent CT guidance. Short Amplatz wires were coiled within both collections. Appropriate positioning was confirmed with a limited CT scan. The tracks were serially dilated allowing placement of 10.2 French drainage catheters into both residual/recurrent perinephric fluid collections. The dominant caudal collection yielded approximately 10 cc of purulent material while the smaller collection yielded approximately 5 cc of purulent material. Both drainage catheters were flushed with a small amount of saline and connected to JP bulbs, sutured in place. Dressings and StatLock devices were applied. The patient tolerated the procedure well without immediate post procedural complication. IMPRESSION: Successful CT guided placement of a 10 French all purpose drain catheter into residual/recurrent perinephric abscesses with dominant collection yielding 10 cc of purulent fluid and smaller collection yielding 5 cc of purulent fluid. PLAN: - Recommend IV only CT scan the abdomen pelvis 1 output from both perinephric abscess drainage catheters is less than 10 cc per day (excluding flush). - Recommend drainage catheter injection prior to removal to exclude communication to  the left renal collecting system. Electronically Signed   By: Sandi Mariscal M.D.   On: 04/03/2019 07:54    Labs:  CBC: Recent Labs    04/01/19 0731 04/02/19 0551 04/03/19 0351 04/04/19 0530  WBC 7.5 7.7 7.1 6.7  HGB 9.0* 9.2* 8.7* 8.4*  HCT 28.4* 29.3* 28.6* 27.9*  PLT 284 315 317 290    COAGS: Recent Labs    03/22/19 0759 03/24/19 1241  INR 1.1  --   APTT  --  36    BMP: Recent Labs    03/28/19 0336 03/29/19 1027 03/30/19 0451 03/31/19 0521  NA 140 141 139 139  K 3.8 3.8 4.3 4.4  CL 97* 98 96* 93*  CO2 36* 34* 37* 37*  GLUCOSE 103* 118* 108* 107*  BUN 18 12 19 21   CALCIUM 8.9 8.9 9.0 9.3    CREATININE 0.58* 0.54* 0.55* 0.61  GFRNONAA >60 >60 >60 >60  GFRAA >60 >60 >60 >60    LIVER FUNCTION TESTS: Recent Labs    24-Mar-2019 1749  BILITOT 0.4  AST 19  ALT 15  ALKPHOS 64  PROT 5.6*  ALBUMIN 2.0*    Assessment and Plan:  All drains intact To be flushed every shift; record OP Recommend IV only CT scan the abdomen pelvis when output from both perinephric abscess drainage catheters is less than 10 cc per day (excluding flush). - Recommend drainage catheter injection prior to removal to exclude communication to the left renal collecting system.  We will follow  Electronically Signed: Lavonia Drafts, [name] 04/04/2019, 10:02 AM   I spent a total of 15 Minutes at the the patient's bedside AND on the patient's hospital floor or unit, greater than 50% of which was counseling/coordinating care for L PCN and Left perinephric drains x 2

## 2019-04-04 NOTE — Progress Notes (Addendum)
ANTICOAGULATION CONSULT NOTE  Pharmacy Consult:  Heparin Indication: atrial fibrillation  Allergies  Allergen Reactions  . Nsaids Other (See Comments)    Stomach pain  . Morphine And Related Nausea And Vomiting and Other (See Comments)    Dizziness, light-headedness and "Opiates cause tightness in chest"  . Vicodin [Hydrocodone-Acetaminophen] Anxiety    Patient Measurements: Height: 5\' 10"  (177.8 cm) Weight: 219 lb (99.3 kg) IBW/kg (Calculated) : 73 Heparin Dosing Weight: 92.3kg   Vital Signs: Temp: 98.8 F (37.1 C) (11/18 0430) Temp Source: Oral (11/18 0430) BP: 103/51 (11/18 0430) Pulse Rate: 71 (11/18 0745)  Labs: Recent Labs    04/02/19 0551 04/03/19 0351 04/04/19 0530  HGB 9.2* 8.7* 8.4*  HCT 29.3* 28.6* 27.9*  PLT 315 317 290  HEPARINUNFRC 0.51 0.28* 0.44    Estimated Creatinine Clearance: 73.9 mL/min (by C-G formula based on SCr of 0.61 mg/dL).   Assessment: 38 yoM admitted 11/4 with perinephric fluid collection requiring nephrostomy tube. Pt on Xarelto PTA for hx AFib (CHADSVASc = 3) which has been held with need for procedures and bridged with IV heparin. Last dose of Xarelto was 11/4 am.  Heparin level at goal (0.44) on 2050 units/hr. Hgb low but stable at 8.4, no overt bleeding issues noted, will follow if hematuria still present.  Goal of Therapy:  Heparin level 0.3-0.7 units/ml Monitor platelets by anticoagulation protocol: Yes   Plan:  Continue heparin at 2050 units/hour Daily heparin level and CBC  Erin Hearing PharmD., BCPS Clinical Pharmacist 04/04/2019 8:49 AM

## 2019-04-04 NOTE — Progress Notes (Signed)
While assessing pts feel pt complained of pressure like pain in left heel. Skin assessed and intact apart from some dryness and flaking , prophylactic foam dressing placed on left heel and heel was floated off the bed to relieve pressure.

## 2019-04-05 LAB — CBC
HCT: 29.8 % — ABNORMAL LOW (ref 39.0–52.0)
Hemoglobin: 8.7 g/dL — ABNORMAL LOW (ref 13.0–17.0)
MCH: 30.6 pg (ref 26.0–34.0)
MCHC: 29.2 g/dL — ABNORMAL LOW (ref 30.0–36.0)
MCV: 104.9 fL — ABNORMAL HIGH (ref 80.0–100.0)
Platelets: 306 10*3/uL (ref 150–400)
RBC: 2.84 MIL/uL — ABNORMAL LOW (ref 4.22–5.81)
RDW: 15.6 % — ABNORMAL HIGH (ref 11.5–15.5)
WBC: 6.5 10*3/uL (ref 4.0–10.5)
nRBC: 0 % (ref 0.0–0.2)

## 2019-04-05 LAB — HEPARIN LEVEL (UNFRACTIONATED): Heparin Unfractionated: 0.51 IU/mL (ref 0.30–0.70)

## 2019-04-05 MED ORDER — SODIUM CHLORIDE 0.9 % IV SOLN
INTRAVENOUS | Status: DC | PRN
  Administered 2019-04-05 – 2019-04-09 (×5): 250 mL via INTRAVENOUS

## 2019-04-05 NOTE — Progress Notes (Signed)
Referring Physician(s): Ghimire,K/Bell,E  Supervising Physician: Richarda Overlie  Patient Status:  [facility] - In-pt  Chief Complaint: Left lower abdominal/flank pain   Subjective: Pt having some loose stools this am; still with some mild left flank tenderness   Allergies: Nsaids, Morphine and related, and Vicodin [hydrocodone-acetaminophen]  Medications: Prior to Admission medications   Medication Sig Start Date End Date Taking? Authorizing Provider  acetaminophen (TYLENOL) 500 MG tablet Take 2 tablets (1,000 mg total) by mouth every 8 (eight) hours. Patient taking differently: Take 1,000 mg by mouth See admin instructions. Take 1,000 mg by mouth every eight hours and do not exceed 4,000 mg/24 hours from all combined sources 01/24/17  Yes Babish, Molli Hazard, [name]  albuterol (PROVENTIL HFA;VENTOLIN HFA) 108 (90 Base) MCG/ACT inhaler Inhale 2 puffs into the lungs every 4 (four) hours as needed for wheezing or shortness of breath.    Yes [provider]  Amino Acids-Protein Hydrolys (FEEDING SUPPLEMENT, PRO-STAT SUGAR FREE 64,) LIQD Take 30 mLs by mouth 3 (three) times daily.    Yes [provider]  amiodarone (PACERONE) 200 MG tablet TAKE ONE TABLET BY MOUTH ONCE DAILY Patient taking differently: Take 200 mg by mouth daily.  10/29/16  Yes Hilty, Lisette Abu, MD  Artificial Saliva (BIOTENE DRY MOUTH MOISTURIZING) SOLN 1 spray by Mouth Rinse route every 2 (two) hours as needed (for dryness).   Yes [provider]  bumetanide (BUMEX) 1 MG tablet Take 1 tablet (1 mg total) by mouth 2 (two) times daily. 10/15/16  Yes Hilty, Lisette Abu, MD  cetirizine (ZYRTEC) 10 MG tablet Take 10 mg by mouth daily.    Yes [provider]  Cholecalciferol (VITAMIN D-3) 25 MCG (1000 UT) CAPS Take 1,000 Units by mouth daily.   Yes [provider]  docusate sodium 100 MG CAPS Take 100 mg by mouth 2 (two) times daily. 08/15/13  Yes Babish, Molli Hazard, [name]  famotidine (PEPCID) 20 MG  tablet Take 1 tablet (20 mg total) by mouth 2 (two) times daily. 02/21/17  Yes Tyrone Nine, MD  ferrous sulfate (FERROUSUL) 325 (65 FE) MG tablet Take 1 tablet (325 mg total) by mouth 3 (three) times daily with meals. Patient taking differently: Take 325 mg by mouth 2 (two) times daily with a meal.  01/24/17  Yes Babish, Molli Hazard, [name]  gabapentin (NEURONTIN) 400 MG capsule TAKE 1 CAPSULE BY MOUTH THREE TIMES DAILY Patient taking differently: Take 400 mg by mouth 3 (three) times daily.  01/14/17  Yes [name], MD  midodrine (PROAMATINE) 10 MG tablet Take 1 tablet (10 mg total) by mouth 3 (three) times daily. 05/28/17  Yes Tyrone Nine, MD  Multiple Vitamins-Minerals (CERTAGEN PO) Take 1 tablet by mouth daily.    Yes [provider]  Multiple Vitamins-Minerals (PRESERVISION AREDS 2) CAPS Take 1 capsule by mouth 2 (two) times daily.   Yes [provider]  nutrition supplement, JUVEN, (JUVEN) PACK Take 1 packet by mouth 2 (two) times daily between meals. 02/21/17  Yes Tyrone Nine, MD  Nutritional Supplements (RESOURCE 2.0) LIQD Take 120 mLs by mouth at bedtime.   Yes [provider]  OXYGEN Inhale 2 L/min into the lungs as needed (for shortness of breath).    Yes [provider]  polyethylene glycol (MIRALAX / GLYCOLAX) packet Take 17 g by mouth 2 (two) times daily. Patient taking differently: Take 17 g by mouth See admin instructions. Mix 17 grams into 4-8 ounces of liquid and drink two  times a day 01/24/17  Yes Babish, Molli Hazard, [name]  potassium chloride (KLOR-CON) 20 MEQ packet Take 20 mEq by mouth daily.   Yes [provider]  PRESCRIPTION MEDICATION BiPAP: At bedtime   Yes [provider]  rivaroxaban (XARELTO) 20 MG TABS tablet Take 1 tablet (20 mg total) by mouth daily. 08/05/15  Yes Hilty, Lisette Abu, MD  rosuvastatin (CRESTOR) 20 MG tablet TAKE ONE TABLET BY MOUTH ONCE A WEEK, FRIDAY Patient taking differently: Take 20 mg by mouth  every Friday.  12/24/16  Yes Hilty, Lisette Abu, MD  simethicone (MYLICON) 125 MG chewable tablet Chew 125 mg by mouth 3 (three) times daily. FOR 5 DAYS Mar 25, 2019 03/26/19 Yes [provider]  SPIRIVA HANDIHALER 18 MCG inhalation capsule INHALE ONE PUFF BY MOUTH ONCE DAILY Patient taking differently: Place 18 mcg into inhaler and inhale daily.  03/18/16  Yes [name], MD  tamsulosin (FLOMAX) 0.4 MG CAPS capsule TAKE 1 CAPSULE BY MOUTH ONCE DAILY Patient taking differently: Take 0.4 mg by mouth daily.  12/24/16  Yes [name], MD  traMADol (ULTRAM) 50 MG tablet Take 1-2 tablets (50-100 mg total) by mouth every 12 (twelve) hours as needed for moderate pain or severe pain (and before hydrotherapy). Patient taking differently: Take 50 mg by mouth at bedtime.  05/28/17  Yes Tyrone Nine, MD  Magnesium 250 MG TABS Take 250 mg by mouth daily.     [provider]  Melatonin 3 MG CAPS Take 3 mg by mouth as needed (insomnia).    [provider]  Multiple Vitamin (MULTIVITAMIN WITH MINERALS) TABS tablet Take 1 tablet by mouth daily.    [provider]  protein supplement shake (PREMIER PROTEIN) LIQD Take 325 mLs (11 oz total) by mouth daily. Patient not taking: Reported on Mar 25, 2019 02/21/17   Tyrone Nine, MD     Vital Signs: BP (!) 102/46 (BP Location: Left Arm)    Pulse 62    Temp 98.5 F (36.9 C) (Oral)    Resp 17    Ht  (1.778 m)    Wt 221 lb (100.2 kg)    SpO2 96%    BMI 31.71 kg/m   Physical Exam left PCN/perinephric abscess drains intact; outputs 25 cc each perinephric drain, 1.1 liters PCN tan colored urine; sites mildly tender to palpation  Imaging: Ct Abdomen Pelvis W Contrast  Result Date: 04/02/2019 CLINICAL DATA:  Patient admitted with sepsis found to have a left-sided obstructing pelvic stone with hydronephrosis and associated perinephric fluid collections for which the patient underwent image guided placement of a left-sided  percutaneous nephrostomy catheter on 03/22/2019 as well as CT-guided placement of a perinephric fluid collection on both 03/22/2019 as well as 03/28/2019. Repeat CT imaging is performed secondary to lack of output from the percutaneous drainage catheters. EXAM: CT ABDOMEN AND PELVIS WITH CONTRAST TECHNIQUE: Multidetector CT imaging of the abdomen and pelvis was performed using the standard protocol following bolus administration of intravenous contrast. CONTRAST:  OMNIPAQUE IOHEXOL 300 MG/ML  SOLN COMPARISON:  CT abdomen and pelvis-March 25, 2019; image guided left-sided percutaneous nephrostomy catheter placement-03/22/2019; CT-guided percutaneous catheter placement-03/22/2019; 03/28/2019 FINDINGS: Lower chest: Limited visualization of the lower thorax demonstrates interval increase in small left and trace right-sided pleural effusion with associated worsening bibasilar consolidative opacities. Cardiomegaly. Coronary artery calcifications. Calcifications of the aortic valve root. No pericardial effusion. Hepatobiliary: Normal hepatic contour. There is a punctate subcentimeter hypoattenuating lesion within the right lobe of the liver (image 18,  series 3), which is too small to adequately characterize of favored to represent a hepatic cyst. No discrete worrisome hepatic lesions. Normal appearance of the gallbladder given underdistention. No intra or extrahepatic biliary duct dilatation. No ascites. Pancreas: The pancreas appears somewhat atrophic but otherwise normal. Spleen: Normal appearance of the spleen. Note is made of a small splenule. Adrenals/Urinary Tract: There is slightly delayed enhancement and excretion of the left kidney in comparison to the right. Interval retraction of left-sided percutaneous nephrostomy catheter external to the left renal collecting system with development of recurrent moderate left-sided pelvicaliectasis Interval retraction of both left-sided perinephric abscess drainage catheters  with Re Q malacia of perinephric fluid with dominant collection about posteromedial aspect of the left kidney measuring approximately 4.5 x 2.1 cm and bilobed collection about the more inferior aspect of the left kidney measuring approximately 7.6 x 3.0 cm, similar to presentation CT scan performed 03-30-2019. Redemonstrated clustered stones within the left renal pelvis and superior pole of the left kidney. No evidence of right-sided nephrolithiasis or urinary obstruction. Redemonstrated approximately 4.1 x 3.2 cm hypoattenuating nonenhancing minimally complex cyst arising from the superomedial aspect the right kidney (image 32, series 3; image 8, series 8). No discrete right-sided renal lesions. Normal appearance of the bilateral adrenal glands. There is thickening involving the lateral aspects of the urinary bladder wall. Stomach/Bowel: Moderate to large colonic stool burden without evidence of enteric obstruction. Normal appearance of the terminal ileum and appendix. No pneumoperitoneum, pneumatosis or portal venous gas. Vascular/Lymphatic: Probably there is a large calcified atherosclerotic amount of irregular plaque through a tortuous but normal caliber abdominal aorta. The major branch vessels of the abdominal aorta, including the IMA, appear patent on this non CTA examination. No bulky retroperitoneal, mesenteric, pelvic or inguinal lymphadenopathy. Reproductive: Dystrophic calcifications within normal sized prostate gland. No free fluid the pelvic cul-de-sac. Other: Mild diffuse body wall anasarca. Tiny mesenteric fat containing periumbilical hernia. Large (at least 7.2 x 3.6 x 9.8 mesenteric fat containing indirect right-sided inguinal hernia. Musculoskeletal: Redemonstrates sacral wound with associated absence/bony erosion of the distal end of the sacrum and partial absence of the coccyx (image 32, series 7). Post left total hip replacement. Unchanged severe (approximately 75%) L3 compression deformity,  similar to presentation CT scan performed March 30, 2019. IMPRESSION: 1. Interval retraction of left-sided percutaneous nephrostomy catheter outside the left renal collecting system with recurrent moderate left-sided pelvicaliectasis. 2. Interval retraction/malpositioning of both perinephric abscess drainage catheters with recurrence of bilateral perinephric fluid collections. 3. Unchanged left-sided nephrolithiasis. 4. Slight increase in small left and trace right-sided pleural effusions. 5. Redemonstrated sacral wound with associated bony erosion involving the distal tip of the sacrum and coccyx. 6. Unchanged severe (approximately 75%) L3 compression deformity with persistent fracture line. Correlation for point tenderness at this location is advised. Further evaluation with lumbar spine MRI could performed as indicated. 7. Moderate to large colonic stool burden without evidence of enteric obstruction. 8.  Aortic Atherosclerosis (ICD10-I70.0). Electronically Signed   By: Sandi Mariscal M.D.   On: 04/02/2019 16:51   Ir Sinus/fist Tube Chk-non Gi  Result Date: 04/02/2019 INDICATION: Patient admitted on 2019-03-30 with sepsis found to have obstructing left-sided nephrolithiasis, for which the patient underwent image guided placement of a left-sided percutaneous drainage catheter and CT-guided placement of a perinephric abscess drainage catheter 03/22/2019. Patient underwent subsequent additional perinephric abscess drainage catheter placement on 03/28/2019. Unfortunately, CT scan performed earlier today demonstrates retraction of the percutaneous nephrostomy catheter outside the left renal collecting system as well as  both perinephric drains outside of both perinephric fluid collections. EXAM: 1. ATTEMPTED NEPHROSTOGRAM VIA EXISTING NEPHROSTOMY CATHETER WITH SUBSEQUENT REMOVAL. 2. IMAGE GUIDED LEFT-SIDED PERCUTANEOUS NEPHROSTOMY CATHETER PLACEMENT 3. LEFT-SIDED PERINEPHRIC ABSCESS DRAINAGE CATHETER INJECTION X2  COMPARISON:  CT abdomen pelvis-earlier same day; 03/27/2019; 04/06/2019; image guided left-sided percutaneous nephrostomy catheter placement-03/22/2019; CT-guided left-sided perinephric abscess drainage catheter placement-03/22/2019; 03/28/2019 MEDICATIONS: Patient is currently admitted to the hospital receiving intravenous antibiotics; the antibiotic was administered in an appropriate time frame prior to skin puncture. ANESTHESIA/SEDATION: Moderate (conscious) sedation was employed during this procedure. A total of Fentanyl 37.5 mcg was administered intravenously. Moderate Sedation Time: 20 minutes. The patient's level of consciousness and vital signs were monitored continuously by radiology nursing throughout the procedure under my direct supervision. CONTRAST:  20 cc mL Isovue 300 administered into the left collecting system as well as the malpositioned left-sided percutaneous nephrostomy catheter as well as both perinephric abscess drainage catheters. FLUOROSCOPY TIME:  5 minutes, 42 seconds (146 mGy) COMPLICATIONS: None immediate. PROCEDURE: The procedure, risks, benefits, and alternatives were explained to the patient. Questions regarding the procedure were encouraged and answered. The patient understands and consents to the procedure. A timeout was performed prior to the initiation of the procedure. The left flank region as well as the external portions of the left-sided nephrostomy catheter as well as both perinephric drainage catheters were prepped with Betadine in a sterile fashion, and a sterile drape was applied covering the operative field. A sterile gown and sterile gloves were used for the procedure. Local anesthesia was provided with 1% Lidocaine with epinephrine. Preprocedural spot fluoroscopic image was obtained of the left upper abdomen as well as the existing left-sided percutaneous nephrostomy catheter in both perinephric abscess drains. Contrast was injected via the existing perinephric abscess  drainage catheters however failed to delineate a patent tract to the location of the perinephric abscesses. As such, both drainage catheters cut and intact. Contrast was then injected via the existing malposition nephrostomy catheter however also failed to delineate patency of nephrostomy tract. As such, the external portion of the nephrostomy catheter was cut and nephrostomy catheter was removed intact. Ultrasound was used to localize the left kidney. Under direct ultrasound guidance, a 21 gauge needle was advanced into the renal collecting system. An ultrasound image documentation was performed. Access within the collecting system was confirmed with the efflux of urine followed by contrast injection. A Nitrex wire was advanced into the left renal collecting system however there is difficulty advancing the Accustick over the Nitrex wire due to the depth of the kidney and associated patient body habitus. As such, a posteroinferior calyx, which was still opacified from contrast-enhanced CT scan performed earlier today, was targeted fluoroscopically with a 22 gauge Chiba needle. Appropriate access to the left renal collecting system was confirmed with injection contrast advancement of a Nitrex wire into the left renal collecting system. Under fluoroscopic guidance, the access needle was exchanged for an Accustick set was advanced to the level of the left renal pelvis. Contrast injection was performed. Over a short Amplatz wire, the track was dilated allowing placement of a 10 French percutaneous nephrostomy catheter with end coiled and locked left renal pelvis. A small amount of contrast was injected demonstrating appropriate position functionality of the nephrostomy catheter The nephrostomy catheter was secured to the skin entrance site with 2 interrupted sutures and a Stat device. The nephrostomy with connected to a gravity bag a dressing was applied. The patient tolerated the procedure well without immediate  postprocedural complication.  FINDINGS: Fluoroscopic guided injection both perinephric drainage catheters demonstrates lack a residual subcutaneous track to the level of the known residual/recurrent perinephric abscesses. As such, both percutaneous drainage catheters were cut and removed intact. Fluoroscopic guided injection of the existing malposition of proximally catheter demonstrates lack of residual subcutaneous track to the level of the left renal system. As such, the pre-existing nephrostomy catheter was cut and removed intact. Ultimately, under fluoroscopic guidance, a dilated posteroinferior calyx (still opacified from contrast-enhanced CT scan performed earlier today), was targeted fluoroscopically allowing placement of a 10 French percutaneous nephrostomy catheter with end coiled and locked within the left renal pelvis. IMPRESSION: 1. Malpositioned left-sided percutaneous nephrostomy catheter requiring removal and definitive new nephrostomy catheter placement. 2. Successful fluoroscopic guided placement of a new 10.2 French left-sided percutaneous nephrostomy catheter. 3. Malpositioned left-sided perinephric abscess drainage catheters requiring new CT-guided perinephric abscess drainage catheter placement. PLAN: - The patient was escorted to CT to undergo replacement of both left-sided perinephric abscess drainage catheters. - Recommend IV only CT scan of the abdomen and pelvis once output from both perinephric abscess drainage catheters is less than 10 cc per day (excluding flushes). Recommend drainage catheter injection prior to removal to exclude communication to the left renal collecting system. - Per my discussion with the patient's urologist (Dr. Alvester Morin) earlier today, we will plan on attempted antegrade nephrostogram and placement of left-sided ureteral stent once patient recovers from the above procedures. Electronically Signed   By: Simonne Come M.D.   On: 04/02/2019 18:04   Ir Sinus/fist Tube  Chk-non Gi  Result Date: 04/02/2019 INDICATION: Patient admitted on Apr 17, 2019 with sepsis found to have obstructing left-sided nephrolithiasis, for which the patient underwent image guided placement of a left-sided percutaneous drainage catheter and CT-guided placement of a perinephric abscess drainage catheter 03/22/2019. Patient underwent subsequent additional perinephric abscess drainage catheter placement on 03/28/2019. Unfortunately, CT scan performed earlier today demonstrates retraction of the percutaneous nephrostomy catheter outside the left renal collecting system as well as both perinephric drains outside of both perinephric fluid collections. EXAM: 1. ATTEMPTED NEPHROSTOGRAM VIA EXISTING NEPHROSTOMY CATHETER WITH SUBSEQUENT REMOVAL. 2. IMAGE GUIDED LEFT-SIDED PERCUTANEOUS NEPHROSTOMY CATHETER PLACEMENT 3. LEFT-SIDED PERINEPHRIC ABSCESS DRAINAGE CATHETER INJECTION X2 COMPARISON:  CT abdomen pelvis-earlier same day; 03/27/2019; 17-Apr-2019; image guided left-sided percutaneous nephrostomy catheter placement-03/22/2019; CT-guided left-sided perinephric abscess drainage catheter placement-03/22/2019; 03/28/2019 MEDICATIONS: Patient is currently admitted to the hospital receiving intravenous antibiotics; the antibiotic was administered in an appropriate time frame prior to skin puncture. ANESTHESIA/SEDATION: Moderate (conscious) sedation was employed during this procedure. A total of Fentanyl 37.5 mcg was administered intravenously. Moderate Sedation Time: 20 minutes. The patient's level of consciousness and vital signs were monitored continuously by radiology nursing throughout the procedure under my direct supervision. CONTRAST:  20 cc mL Isovue 300 administered into the left collecting system as well as the malpositioned left-sided percutaneous nephrostomy catheter as well as both perinephric abscess drainage catheters. FLUOROSCOPY TIME:  5 minutes, 42 seconds (146 mGy) COMPLICATIONS: None immediate.  PROCEDURE: The procedure, risks, benefits, and alternatives were explained to the patient. Questions regarding the procedure were encouraged and answered. The patient understands and consents to the procedure. A timeout was performed prior to the initiation of the procedure. The left flank region as well as the external portions of the left-sided nephrostomy catheter as well as both perinephric drainage catheters were prepped with Betadine in a sterile fashion, and a sterile drape was applied covering the operative field. A sterile gown and sterile gloves were used  for the procedure. Local anesthesia was provided with 1% Lidocaine with epinephrine. Preprocedural spot fluoroscopic image was obtained of the left upper abdomen as well as the existing left-sided percutaneous nephrostomy catheter in both perinephric abscess drains. Contrast was injected via the existing perinephric abscess drainage catheters however failed to delineate a patent tract to the location of the perinephric abscesses. As such, both drainage catheters cut and intact. Contrast was then injected via the existing malposition nephrostomy catheter however also failed to delineate patency of nephrostomy tract. As such, the external portion of the nephrostomy catheter was cut and nephrostomy catheter was removed intact. Ultrasound was used to localize the left kidney. Under direct ultrasound guidance, a 21 gauge needle was advanced into the renal collecting system. An ultrasound image documentation was performed. Access within the collecting system was confirmed with the efflux of urine followed by contrast injection. A Nitrex wire was advanced into the left renal collecting system however there is difficulty advancing the Accustick over the Nitrex wire due to the depth of the kidney and associated patient body habitus. As such, a posteroinferior calyx, which was still opacified from contrast-enhanced CT scan performed earlier today, was targeted  fluoroscopically with a 22 gauge Chiba needle. Appropriate access to the left renal collecting system was confirmed with injection contrast advancement of a Nitrex wire into the left renal collecting system. Under fluoroscopic guidance, the access needle was exchanged for an Accustick set was advanced to the level of the left renal pelvis. Contrast injection was performed. Over a short Amplatz wire, the track was dilated allowing placement of a 10 French percutaneous nephrostomy catheter with end coiled and locked left renal pelvis. A small amount of contrast was injected demonstrating appropriate position functionality of the nephrostomy catheter The nephrostomy catheter was secured to the skin entrance site with 2 interrupted sutures and a Stat device. The nephrostomy with connected to a gravity bag a dressing was applied. The patient tolerated the procedure well without immediate postprocedural complication. FINDINGS: Fluoroscopic guided injection both perinephric drainage catheters demonstrates lack a residual subcutaneous track to the level of the known residual/recurrent perinephric abscesses. As such, both percutaneous drainage catheters were cut and removed intact. Fluoroscopic guided injection of the existing malposition of proximally catheter demonstrates lack of residual subcutaneous track to the level of the left renal system. As such, the pre-existing nephrostomy catheter was cut and removed intact. Ultimately, under fluoroscopic guidance, a dilated posteroinferior calyx (still opacified from contrast-enhanced CT scan performed earlier today), was targeted fluoroscopically allowing placement of a 10 French percutaneous nephrostomy catheter with end coiled and locked within the left renal pelvis. IMPRESSION: 1. Malpositioned left-sided percutaneous nephrostomy catheter requiring removal and definitive new nephrostomy catheter placement. 2. Successful fluoroscopic guided placement of a new 10.2 French  left-sided percutaneous nephrostomy catheter. 3. Malpositioned left-sided perinephric abscess drainage catheters requiring new CT-guided perinephric abscess drainage catheter placement. PLAN: - The patient was escorted to CT to undergo replacement of both left-sided perinephric abscess drainage catheters. - Recommend IV only CT scan of the abdomen and pelvis once output from both perinephric abscess drainage catheters is less than 10 cc per day (excluding flushes). Recommend drainage catheter injection prior to removal to exclude communication to the left renal collecting system. - Per my discussion with the patient's urologist (Dr. Alvester Morin) earlier today, we will plan on attempted antegrade nephrostogram and placement of left-sided ureteral stent once patient recovers from the above procedures. Electronically Signed   By: Simonne Come M.D.   On:  04/02/2019 18:04   Ct Image Guided Fluid Drain By Catheter  Result Date: 04/03/2019 INDICATION: Recent admission with urosepsis, post left-sided percutaneous nephrostomy catheter placement on 03/22/2019 as well as CT guided perinephric abscess drainage catheter placement on 03/22/2019 and again on 03/28/2019. Unfortunately, CT scan of the abdomen pelvis performed earlier today retraction of the nephrostomy catheter outside the left renal collecting system as well as retraction of both perinephric abscess drainage catheters outside the residual perinephric fluid collection/abscesses. Patient has undergone image guided placement of a new left-sided percutaneous nephrostomy catheter earlier today however attempted fluoroscopic guided replacement of the perinephric drains was not possible due to lack of a subcutaneous track. As such, patient now presents for CT guided replacement of perinephric abscess drainage catheters. EXAM: CT IMAGE GUIDED FLUID DRAIN BY CATHETER x2 COMPARISON:  Image guided percutaneous nephrostomy catheter placement-03/22/2019; earlier same day;  CT-guided percutaneous drainage catheter placement-03/22/2019; 03/28/2019; CT abdomen pelvis-earlier same day; 03/27/2019; 2019/04/06 MEDICATIONS: The patient is currently admitted to the hospital and receiving intravenous antibiotics. The antibiotics were administered within an appropriate time frame prior to the initiation of the procedure. ANESTHESIA/SEDATION: None - the patient was still sedated from medications administered during preceding nephrostomy placement CONTRAST:  None COMPLICATIONS: None immediate. PROCEDURE: Informed written consent was obtained from the patient after a discussion of the risks, benefits and alternatives to treatment. The patient was placed supine, slightly RPO on the CT gantry and a pre procedural CT was performed re-demonstrating the known abscess/fluid collections within the left perinephric space with dominant collection about the posteromedial aspect the left kidney measuring approximately 4.4 by 1.8 cm (image 59, series 3 and additional bilobed collection about the more caudal aspect of the left kidney measuring approximately 7.7 x 3.0 cm (image 63, series 2). The procedure was planned. A timeout was performed prior to the initiation of the procedure. The skin overlying the left lateral abdomen was prepped and draped in the usual sterile fashion. The overlying soft tissues were anesthetized with 1% lidocaine with epinephrine. Appropriate trajectory was planned with the use of a 22 gauge spinal needles. Next, 18 gauge trocar needles were advanced into both perinephric abscess fluid collections under intermittent CT guidance. Short Amplatz wires were coiled within both collections. Appropriate positioning was confirmed with a limited CT scan. The tracks were serially dilated allowing placement of 10.2 French drainage catheters into both residual/recurrent perinephric fluid collections. The dominant caudal collection yielded approximately 10 cc of purulent material while the  smaller collection yielded approximately 5 cc of purulent material. Both drainage catheters were flushed with a small amount of saline and connected to JP bulbs, sutured in place. Dressings and StatLock devices were applied. The patient tolerated the procedure well without immediate post procedural complication. IMPRESSION: Successful CT guided placement of a 10 French all purpose drain catheter into residual/recurrent perinephric abscesses with dominant collection yielding 10 cc of purulent fluid and smaller collection yielding 5 cc of purulent fluid. PLAN: - Recommend IV only CT scan the abdomen pelvis 1 output from both perinephric abscess drainage catheters is less than 10 cc per day (excluding flush). - Recommend drainage catheter injection prior to removal to exclude communication to the left renal collecting system. Electronically Signed   By: Simonne Come M.D.   On: 04/03/2019 07:54   Ct Image Guided Fluid Drain By Catheter  Result Date: 04/03/2019 INDICATION: Recent admission with urosepsis, post left-sided percutaneous nephrostomy catheter placement on 03/22/2019 as well as CT guided perinephric abscess drainage catheter placement on  03/22/2019 and again on 03/28/2019. Unfortunately, CT scan of the abdomen pelvis performed earlier today retraction of the nephrostomy catheter outside the left renal collecting system as well as retraction of both perinephric abscess drainage catheters outside the residual perinephric fluid collection/abscesses. Patient has undergone image guided placement of a new left-sided percutaneous nephrostomy catheter earlier today however attempted fluoroscopic guided replacement of the perinephric drains was not possible due to lack of a subcutaneous track. As such, patient now presents for CT guided replacement of perinephric abscess drainage catheters. EXAM: CT IMAGE GUIDED FLUID DRAIN BY CATHETER x2 COMPARISON:  Image guided percutaneous nephrostomy catheter  placement-03/22/2019; earlier same day; CT-guided percutaneous drainage catheter placement-03/22/2019; 03/28/2019; CT abdomen pelvis-earlier same day; 03/27/2019; 03/25/2019 MEDICATIONS: The patient is currently admitted to the hospital and receiving intravenous antibiotics. The antibiotics were administered within an appropriate time frame prior to the initiation of the procedure. ANESTHESIA/SEDATION: None - the patient was still sedated from medications administered during preceding nephrostomy placement CONTRAST:  None COMPLICATIONS: None immediate. PROCEDURE: Informed written consent was obtained from the patient after a discussion of the risks, benefits and alternatives to treatment. The patient was placed supine, slightly RPO on the CT gantry and a pre procedural CT was performed re-demonstrating the known abscess/fluid collections within the left perinephric space with dominant collection about the posteromedial aspect the left kidney measuring approximately 4.4 by 1.8 cm (image 59, series 3 and additional bilobed collection about the more caudal aspect of the left kidney measuring approximately 7.7 x 3.0 cm (image 63, series 2). The procedure was planned. A timeout was performed prior to the initiation of the procedure. The skin overlying the left lateral abdomen was prepped and draped in the usual sterile fashion. The overlying soft tissues were anesthetized with 1% lidocaine with epinephrine. Appropriate trajectory was planned with the use of a 22 gauge spinal needles. Next, 18 gauge trocar needles were advanced into both perinephric abscess fluid collections under intermittent CT guidance. Short Amplatz wires were coiled within both collections. Appropriate positioning was confirmed with a limited CT scan. The tracks were serially dilated allowing placement of 10.2 French drainage catheters into both residual/recurrent perinephric fluid collections. The dominant caudal collection yielded approximately 10  cc of purulent material while the smaller collection yielded approximately 5 cc of purulent material. Both drainage catheters were flushed with a small amount of saline and connected to JP bulbs, sutured in place. Dressings and StatLock devices were applied. The patient tolerated the procedure well without immediate post procedural complication. IMPRESSION: Successful CT guided placement of a 10 French all purpose drain catheter into residual/recurrent perinephric abscesses with dominant collection yielding 10 cc of purulent fluid and smaller collection yielding 5 cc of purulent fluid. PLAN: - Recommend IV only CT scan the abdomen pelvis 1 output from both perinephric abscess drainage catheters is less than 10 cc per day (excluding flush). - Recommend drainage catheter injection prior to removal to exclude communication to the left renal collecting system. Electronically Signed   By: Simonne ComeJohn  Watts M.D.   On: 04/03/2019 07:54    Labs:  CBC: Recent Labs    04/02/19 0551 04/03/19 0351 04/04/19 0530 04/05/19 0437  WBC 7.7 7.1 6.7 6.5  HGB 9.2* 8.7* 8.4* 8.7*  HCT 29.3* 28.6* 27.9* 29.8*  PLT 315 317 290 306    COAGS: Recent Labs    03/22/19 0759 03/24/19 1241  INR 1.1  --   APTT  --  36    BMP: Recent Labs  03/28/19 0336 03/29/19 1027 03/30/19 0451 03/31/19 0521  NA 140 141 139 139  K 3.8 3.8 4.3 4.4  CL 97* 98 96* 93*  CO2 36* 34* 37* 37*  GLUCOSE 103* 118* 108* 107*  BUN 18 12 19 21   CALCIUM 8.9 8.9 9.0 9.3  CREATININE 0.58* 0.54* 0.55* 0.61  GFRNONAA >60 >60 >60 >60  GFRAA >60 >60 >60 >60    LIVER FUNCTION TESTS: Recent Labs    07-30-2018 1749  BILITOT 0.4  AST 19  ALT 15  ALKPHOS 64  PROT 5.6*  ALBUMIN 2.0*    Assessment and Plan: Pt with hx left sided urinary obst/hydronephrosis/nephrolithiasis, assoc perinephric abscesses; s/p left PCN/left RP fluid coll drain 03/22/19, left inf perinephric drain 11/11; s/p replacement of left PCN/perinephric abscess drains  11/17 secondary to retraction; afebrile; WBC nl; hgb 8.7; last creat nl;  cont current tx; rec CT A/P with IV contrast once drain outputs< 10 cc/day excl flush amount; will also need drain injections prior to removal    Electronically Signed: D. Jeananne RamaKevin Camyra Vaeth, [name] 04/05/2019, 9:33 AM   I spent a total of 15 minutes at the the patient's bedside AND on the patient's hospital floor or unit, greater than 50% of which was counseling/coordinating care for left nephrostomy/perinephric abscess drains    Patient ID: [name], male   DOB: 1927/02/22, 85 y.o.   MRN: [id number]

## 2019-04-05 NOTE — Progress Notes (Signed)
ABG collected, and pt placed back on 3L [place]. RT will continue to monitor.

## 2019-04-05 NOTE — Progress Notes (Signed)
Pt CPAP/BiPAP on stand by at pt bedside. Pt on 3L [place] tolerating well. RT will continue to monitor.

## 2019-04-05 NOTE — Progress Notes (Signed)
Patient switched to BiPAP, however pt continuously dasating despite of fixing the mask.RT came to checked, changed BIPap setttings but SPO2 remains 89%. Patient has no  trouble on [place] at 3 liters O2 sating 98%.   Notified NP Bl[place], ordered to do ABG now and switched to [place]. RT was aware and will monitor

## 2019-04-05 NOTE — Progress Notes (Signed)
ANTICOAGULATION CONSULT NOTE  Pharmacy Consult:  Heparin Indication: atrial fibrillation  Allergies  Allergen Reactions  . Nsaids Other (See Comments)    Stomach pain  . Morphine And Related Nausea And Vomiting and Other (See Comments)    Dizziness, light-headedness and "Opiates cause tightness in chest"  . Vicodin [Hydrocodone-Acetaminophen] Anxiety    Patient Measurements: Height: 5\' 10"  (177.8 cm) Weight: 221 lb (100.2 kg) IBW/kg (Calculated) : 73 Heparin Dosing Weight: 92.3kg   Vital Signs: Temp: 98.3 F (36.8 C) (11/19 0416) Temp Source: Oral (11/19 0416) BP: 107/52 (11/19 0416) Pulse Rate: 66 (11/19 0416)  Labs: Recent Labs    04/03/19 0351 04/04/19 0530 04/05/19 0437  HGB 8.7* 8.4* 8.7*  HCT 28.6* 27.9* 29.8*  PLT 317 290 306  HEPARINUNFRC 0.28* 0.44 0.51    Estimated Creatinine Clearance: 74.3 mL/min (by C-G formula based on SCr of 0.61 mg/dL).   Assessment: 31 yoM admitted 11/4 with perinephric fluid collection requiring nephrostomy tube. Pt on Xarelto PTA for hx AFib (CHADSVASc = 3) which has been held with need for procedures and bridged with IV heparin. Last dose of Xarelto was 11/4 am. Plans noted for ureteric stent once drains are dry -heparin level at goal, CBC stable    Goal of Therapy:  Heparin level 0.3-0.7 units/ml Monitor platelets by anticoagulation protocol: Yes   Plan:  Continue heparin at 2050 units/hour Daily heparin level and CBC  Hildred Laser, PharmD Clinical Pharmacist **Pharmacist phone directory can now be found on [email or web address] ([email or web address]).  Listed under [place].

## 2019-04-05 NOTE — Progress Notes (Signed)
PROGRESS NOTE    [name]  EBX:[id number] DOB: [birth year] DOA: 03/22/2019 PCP: System, Pcp Not In    Brief Narrative:  85 y.o.malewith medical history significant foratrial fibrillation on Xarelto, chronic diastolic CHF, asthma/COPD, OSA on BiPAP, hypotension on midodrine, and chronic sacral ulcer, who was brought to the emergency department for evaluation of progressive abdominal distention with alternating constipation and watery stool.  Found to have perinephric fluid collection requiring nephrostomy tube. Patient is from long-term nursing home.  He used to walk with a walker and not done so for at least 6 months now.  Mostly wheelchair-bound. Underwent multiple procedures , percutaneous nephrostomy tube on 11/5, 11/11, 04/02/2019 with radiology. Remains in the hospital to complete treatment.   Assessment & Plan:   Principal Problem:   Perinephric fluid collection Active Problems:   Asthma   Paroxysmal Atrial fibrillation - admitted with RVR   Chronic diastolic CHF (congestive heart failure) (HCC)   AF (paroxysmal atrial fibrillation) (HCC)   Hypokalemia   Ileus (HCC)   Closed compression fracture of L4 vertebra (HCC)   Sacral pressure ulcer   Renal cyst, left  Abdominal pain secondary to perinephric fluid collection/left-sided hydronephrosis with urinoma/complicated UTI: 03/22/2019, superior pole nephrostomy tube placed 03/28/2019, inferior pole nephrostomy tube placed due to collection not drained. 04/02/2019, inadequate drainage, new nephrostomy tubes placed x2. Urine culture with Proteus.  Blood cultures negative.  Still has inadequately drained infection, will continue ceftriaxone until surgical procedures are done. Patient will need antegrade ureterogram and stenting as per IR schedule. Now draining adequate.  Hematuria: Patient developed gross hematuria and now improved.  Continue on heparin  Until no more surgical procedures.  Will change to Xarelto when no more  surgery needed.   Chronic atrial fibrillation: Rate controlled.  On amiodarone.  in sinus rhythm.  Was on Xarelto prior to admission that is on hold pending clinical improvement.  Now on heparin.  Hypotension: Blood pressures fairly stable.  Patient on midodrine that he will continue.  Chronic sacral pressure ulcer stage IV present on admission: Followed by wound care. Stable.   Obstructive sleep apnea: Patient is on BiPAP.  Uses BiPAP at night.  Family noted patient to be more lethargic.  Will check blood gas in the morning after all night on BiPAP to see the response.  Deconditioning/long-term nursing home resident: Work with PT OT.  Unsure whether he is rehab candidate.  Needs to go back to skilled nursing facility.  Patient will ultimately need ureteric stent by IR once drains are dry, he needs to stay on heparin bridging, will stay in the hospital.  DVT prophylaxis: Heparin infusion Code Status: Partial Family Communication: none, patient's daughter Disposition Plan: Back to nursing home when stable and needing no more surgeries.   Consultants:   Urology  Interventional radiology  Procedures:   Percutaneous drain, 11/5, 11/11 left kidney  Percutaneous drain, nephrostomy 04/02/2019  Antimicrobials:   Rocephin, 04/03/2019---   Subjective: Seen and examined.  No overnight events. Sinus rhythm. No more hematuria.  Pain is controlled. Drain output more than 1 L on the nephrostomy tube.  Patient is sleepy, does not offer much complaints. "Can you please make sure you call my daughter as she will not be able to make it today"  Objective: Vitals:   04/05/19 0416 04/05/19 0752 04/05/19 0850 04/05/19 1141  BP: (!) 107/52  (!) 102/46 (!) 115/48  Pulse: 66  62 63  Resp: 16  17 18   Temp: 98.3 F (36.8 C)  98.5 F (36.9 C) 98 F (36.7 C)  TempSrc: Oral  Oral   SpO2: 97% 95% 96% 96%  Weight: 100.2 kg     Height:        Intake/Output Summary (Last 24 hours) at  04/05/2019 1402 Last data filed at 04/05/2019 1300 Gross per 24 hour  Intake 1636.26 ml  Output 1475 ml  Net 161.26 ml   Filed Weights   04/03/19 0411 04/04/19 0430 04/05/19 0416  Weight: 100.2 kg 99.3 kg 100.2 kg    Examination: Physical Exam  Constitutional: He is oriented to person, place, and time.  Chronically sick looking.  Frail old man appropriate for his age.  On 2 L oxygen.  HENT:  Head: Normocephalic and atraumatic.  Eyes: Pupils are equal, round, and reactive to light.  Neck: Neck supple.  Cardiovascular: Normal rate and regular rhythm.  Pulmonary/Chest: Breath sounds normal.  Abdominal: Bowel sounds are normal.  Obese pendulous.  No localized tenderness.   Percutaneous tubes with clear urine.. Condom catheter with clear urine.  Neurological: He is alert and oriented to person, place, and time.  Psychiatric: Mood normal.      Data Reviewed: I have personally reviewed following labs and imaging studies  CBC: Recent Labs  Lab 04/01/19 0731 04/02/19 0551 04/03/19 0351 04/04/19 0530 04/05/19 0437  WBC 7.5 7.7 7.1 6.7 6.5  HGB 9.0* 9.2* 8.7* 8.4* 8.7*  HCT 28.4* 29.3* 28.6* 27.9* 29.8*  MCV 100.7* 100.3* 102.5* 102.6* 104.9*  PLT 284 315 317 290 829   Basic Metabolic Panel: Recent Labs  Lab 03/30/19 0451 03/31/19 0521  NA 139 139  K 4.3 4.4  CL 96* 93*  CO2 37* 37*  GLUCOSE 108* 107*  BUN 19 21  CREATININE 0.55* 0.61  CALCIUM 9.0 9.3   GFR: Estimated Creatinine Clearance: 74.3 mL/min (by C-G formula based on SCr of 0.61 mg/dL). Liver Function Tests: No results for input(s): AST, ALT, ALKPHOS, BILITOT, PROT, ALBUMIN in the last 168 hours. No results for input(s): LIPASE, AMYLASE in the last 168 hours. No results for input(s): AMMONIA in the last 168 hours. Coagulation Profile: No results for input(s): INR, PROTIME in the last 168 hours. Cardiac Enzymes: No results for input(s): CKTOTAL, CKMB, CKMBINDEX, TROPONINI in the last 168 hours. BNP  (last 3 results) No results for input(s): PROBNP in the last 8760 hours. HbA1C: No results for input(s): HGBA1C in the last 72 hours. CBG: No results for input(s): GLUCAP in the last 168 hours. Lipid Profile: No results for input(s): CHOL, HDL, LDLCALC, TRIG, CHOLHDL, LDLDIRECT in the last 72 hours. Thyroid Function Tests: No results for input(s): TSH, T4TOTAL, FREET4, T3FREE, THYROIDAB in the last 72 hours. Anemia Panel: No results for input(s): VITAMINB12, FOLATE, FERRITIN, TIBC, IRON, RETICCTPCT in the last 72 hours. Sepsis Labs: No results for input(s): PROCALCITON, LATICACIDVEN in the last 168 hours.  Recent Results (from the past 240 hour(s))  Aerobic/Anaerobic Culture (surgical/deep wound)     Status: None   Collection Time: 03/28/19  4:19 PM   Specimen: Abscess  Result Value Ref Range Status   Specimen Description ABSCESS PERINEPHRIC DRAIN  Final   Special Requests Normal  Final   Gram Stain   Final    RARE WBC PRESENT,BOTH PMN AND MONONUCLEAR NO ORGANISMS SEEN    Culture   Final    RARE PROTEUS MIRABILIS NO ANAEROBES ISOLATED Performed at [facility], [address], [place], [place] [address]    Report Status 04/02/2019 FINAL  Final   Organism ID, Bacteria PROTEUS MIRABILIS  Final      Susceptibility   Proteus mirabilis - MIC*    AMPICILLIN >=32 RESISTANT Resistant     CEFAZOLIN 16 SENSITIVE Sensitive     CEFEPIME <=1 SENSITIVE Sensitive     CEFTAZIDIME <=1 SENSITIVE Sensitive     CEFTRIAXONE <=1 SENSITIVE Sensitive     CIPROFLOXACIN >=4 RESISTANT Resistant     GENTAMICIN <=1 SENSITIVE Sensitive     IMIPENEM 2 SENSITIVE Sensitive     TRIMETH/SULFA >=320 RESISTANT Resistant     AMPICILLIN/SULBACTAM >=32 RESISTANT Resistant     PIP/TAZO <=4 SENSITIVE Sensitive     * RARE PROTEUS MIRABILIS  Culture, Urine     Status: None   Collection Time: 03/30/19  6:54 AM   Specimen: Urine, Random  Result Value Ref Range Status   Specimen Description URINE, RANDOM  BAG,BED  Final   Special Requests NONE  Final   Culture   Final    NO GROWTH Performed at [facility], [address] [place], [place] [address]    Report Status 03/31/2019 FINAL  Final  SARS CORONAVIRUS 2 (TAT 6-24 HRS) Nasopharyngeal Nasopharyngeal Swab     Status: None   Collection Time: 04/01/19 11:55 AM   Specimen: Nasopharyngeal Swab  Result Value Ref Range Status   SARS Coronavirus 2 NEGATIVE NEGATIVE Final    Comment: (NOTE) SARS-CoV-2 target nucleic acids are NOT DETECTED. The SARS-CoV-2 RNA is generally detectable in upper and lower respiratory specimens during the acute phase of infection. Negative results do not preclude SARS-CoV-2 infection, do not rule out co-infections with other pathogens, and should not be used as the sole basis for treatment or other patient management decisions. Negative results must be combined with clinical observations, patient history, and epidemiological information. The expected result is Negative. Fact Sheet for Patients: HairSlick.no Fact Sheet for Healthcare Providers: quierodirigir.com This test is not yet approved or cleared by the [place] FDA and  has been authorized for detection and/or diagnosis of SARS-CoV-2 by FDA under an Emergency Use Authorization (EUA). This EUA will remain  in effect (meaning this test can be used) for the duration of the COVID-19 declaration under Section 56 4(b)(1) of the Act, 21 U.S.C. section 360bbb-3(b)(1), unless the authorization is terminated or revoked sooner. Performed at [facility], [address] [place], [place] [address]          Radiology Studies: No results found.      Scheduled Meds: . amiodarone  200 mg Oral Daily  . famotidine  20 mg Oral BID  . feeding supplement (PRO-STAT SUGAR FREE 64)  30 mL Oral BID WC  . gabapentin  400 mg Oral TID  . midodrine  10 mg Oral TID WC  . multivitamin with  minerals  1 tablet Oral Daily  . nutrition supplement (JUVEN)  1 packet Oral BID BM  . potassium chloride  20 mEq Oral Daily  . Ensure Max Protein  11 oz Oral QHS  . sodium chloride flush  3 mL Intravenous Q12H  . sodium chloride flush  5 mL Intracatheter Q8H  . umeclidinium bromide  1 puff Inhalation Daily   Continuous Infusions: . cefTRIAXone (ROCEPHIN)  IV 2 g (04/04/19 1745)  . heparin 2,050 Units/hr (04/05/19 0235)     LOS: 14 days    Time spent: 25 minutes    Dorcas Carrow, MD [facility] Pager [phone]

## 2019-04-06 ENCOUNTER — Inpatient Hospital Stay (HOSPITAL_COMMUNITY): Payer: Medicare Other

## 2019-04-06 LAB — BLOOD GAS, ARTERIAL
Acid-Base Excess: 12.2 mmol/L — ABNORMAL HIGH (ref 0.0–2.0)
Acid-Base Excess: 12.4 mmol/L — ABNORMAL HIGH (ref 0.0–2.0)
Bicarbonate: 38.5 mmol/L — ABNORMAL HIGH (ref 20.0–28.0)
Bicarbonate: 38.9 mmol/L — ABNORMAL HIGH (ref 20.0–28.0)
Drawn by: 330991
FIO2: 21
FIO2: 40
O2 Saturation: 85.4 %
O2 Saturation: 97.8 %
Patient temperature: 37
Patient temperature: 37
pCO2 arterial: 75.6 mmHg (ref 32.0–48.0)
pCO2 arterial: 79.7 mmHg (ref 32.0–48.0)
pH, Arterial: 7.328 — ABNORMAL LOW (ref 7.350–7.450)
pH, Arterial: 7.31 — ABNORMAL LOW (ref 7.350–7.450)
pO2, Arterial: 52.2 mmHg — ABNORMAL LOW (ref 83.0–108.0)
pO2, Arterial: 98.4 mmHg (ref 83.0–108.0)

## 2019-04-06 LAB — HEPARIN LEVEL (UNFRACTIONATED): Heparin Unfractionated: 0.44 IU/mL (ref 0.30–0.70)

## 2019-04-06 LAB — CBC
HCT: 29.9 % — ABNORMAL LOW (ref 39.0–52.0)
Hemoglobin: 9 g/dL — ABNORMAL LOW (ref 13.0–17.0)
MCH: 31.8 pg (ref 26.0–34.0)
MCHC: 30.1 g/dL (ref 30.0–36.0)
MCV: 105.7 fL — ABNORMAL HIGH (ref 80.0–100.0)
Platelets: 313 10*3/uL (ref 150–400)
RBC: 2.83 MIL/uL — ABNORMAL LOW (ref 4.22–5.81)
RDW: 15.2 % (ref 11.5–15.5)
WBC: 6.6 10*3/uL (ref 4.0–10.5)
nRBC: 0 % (ref 0.0–0.2)

## 2019-04-06 MED ORDER — METRONIDAZOLE IN NACL 5-0.79 MG/ML-% IV SOLN
500.0000 mg | Freq: Three times a day (TID) | INTRAVENOUS | Status: DC
  Administered 2019-04-06 – 2019-04-13 (×21): 500 mg via INTRAVENOUS
  Filled 2019-04-06 (×21): qty 100

## 2019-04-06 NOTE — Progress Notes (Signed)
Bryan from lab notified of ABG being sent down. RN notified as well.

## 2019-04-06 NOTE — Progress Notes (Signed)
Pt placed on BiPAP V60 machine per MD order. Pt tolerating settings okay at this time. RT will draw ABG in about one hour. RT will continue to monitor.

## 2019-04-06 NOTE — Progress Notes (Signed)
Spoke earlier today with Ghimire MD regarding patients dark colored urine noted in the external catheter.  It looks as same color as nephrostomy bag.

## 2019-04-06 NOTE — Progress Notes (Signed)
Pt was unable to take sip of water and take evening medication. Pt will respond to daughter, but is lethargic.  Spoke with pt's daughter regarding plan of care. Pt's daughter currently at bedside.  Pt was place in BiPAP.

## 2019-04-06 NOTE — Progress Notes (Signed)
Patient [name] showed PCO2 was 75.6 but this was drawn after patient was on bIPAP in which pt desats.   NP Blount was paged about this. Pt is now on 3L O2 sating 94%. Pt alert and oriented, no complaints.  Will monitor.

## 2019-04-06 NOTE — Progress Notes (Signed)
PT Cancellation Note  Patient Details Name: EASHAN SCHIPANI MRN: [id number] DOB: 1926-08-15   Cancelled Treatment:    Reason Eval/Treat Not Completed: Medical issues which prohibited therapy. Pt lethargic with persistent hypercapnia. Will check back for PT treatment as appropriate.  Mabeline Caras, PT, DPT Acute Rehabilitation Services  Pager [phone] Office [place] 04/06/2019, 4:16 PM

## 2019-04-06 NOTE — Progress Notes (Signed)
Referring Physician(s): Dr. Bell/Dr. Jerral RalphGhimire  Supervising Physician: Ruel FavorsShick, Trevor  Patient Status:  [facility]  Chief Complaint: Follow up left PCN and left perinephric abscess drains x 2.   Subjective:  Patient on BiPAP today, shakes his head no when asked if he's having pain. Difficult to illicit history today due to some somnolence and difficulty speaking with mask on.   Allergies: Nsaids, Morphine and related, and Vicodin [hydrocodone-acetaminophen]  Medications: Prior to Admission medications   Medication Sig Start Date End Date Taking? Authorizing Provider  acetaminophen (TYLENOL) 500 MG tablet Take 2 tablets (1,000 mg total) by mouth every 8 (eight) hours. Patient taking differently: Take 1,000 mg by mouth See admin instructions. Take 1,000 mg by mouth every eight hours and do not exceed 4,000 mg/24 hours from all combined sources 01/24/17  Yes Babish, Molli HazardMatthew, [name]  albuterol (PROVENTIL HFA;VENTOLIN HFA) 108 (90 Base) MCG/ACT inhaler Inhale 2 puffs into the lungs every 4 (four) hours as needed for wheezing or shortness of breath.    Yes [provider]  Amino Acids-Protein Hydrolys (FEEDING SUPPLEMENT, PRO-STAT SUGAR FREE 64,) LIQD Take 30 mLs by mouth 3 (three) times daily.    Yes [provider]  amiodarone (PACERONE) 200 MG tablet TAKE ONE TABLET BY MOUTH ONCE DAILY Patient taking differently: Take 200 mg by mouth daily.  10/29/16  Yes Hilty, Lisette AbuKenneth C, MD  Artificial Saliva (BIOTENE DRY MOUTH MOISTURIZING) SOLN 1 spray by Mouth Rinse route every 2 (two) hours as needed (for dryness).   Yes [provider]  bumetanide (BUMEX) 1 MG tablet Take 1 tablet (1 mg total) by mouth 2 (two) times daily. 10/15/16  Yes Hilty, Lisette AbuKenneth C, MD  cetirizine (ZYRTEC) 10 MG tablet Take 10 mg by mouth daily.    Yes [provider]  Cholecalciferol (VITAMIN D-3) 25 MCG (1000 UT) CAPS Take 1,000 Units by mouth daily.   Yes [provider]  docusate  sodium 100 MG CAPS Take 100 mg by mouth 2 (two) times daily. 08/15/13  Yes Babish, Molli HazardMatthew, [name]  famotidine (PEPCID) 20 MG tablet Take 1 tablet (20 mg total) by mouth 2 (two) times daily. 02/21/17  Yes Tyrone NineGrunz, Ryan B, MD  ferrous sulfate (FERROUSUL) 325 (65 FE) MG tablet Take 1 tablet (325 mg total) by mouth 3 (three) times daily with meals. Patient taking differently: Take 325 mg by mouth 2 (two) times daily with a meal.  01/24/17  Yes Babish, Molli HazardMatthew, [name]  gabapentin (NEURONTIN) 400 MG capsule TAKE 1 CAPSULE BY MOUTH THREE TIMES DAILY Patient taking differently: Take 400 mg by mouth 3 (three) times daily.  01/14/17  Yes Gordy SaversKwiatkowski, Peter F, MD  midodrine (PROAMATINE) 10 MG tablet Take 1 tablet (10 mg total) by mouth 3 (three) times daily. 05/28/17  Yes Tyrone NineGrunz, Ryan B, MD  Multiple Vitamins-Minerals (CERTAGEN PO) Take 1 tablet by mouth daily.    Yes [provider]  Multiple Vitamins-Minerals (PRESERVISION AREDS 2) CAPS Take 1 capsule by mouth 2 (two) times daily.   Yes [provider]  nutrition supplement, JUVEN, (JUVEN) PACK Take 1 packet by mouth 2 (two) times daily between meals. 02/21/17  Yes Tyrone NineGrunz, Ryan B, MD  Nutritional Supplements (RESOURCE 2.0) LIQD Take 120 mLs by mouth at bedtime.   Yes [provider]  OXYGEN Inhale 2 L/min into the lungs as needed (for shortness of breath).    Yes [provider]  polyethylene glycol (MIRALAX / GLYCOLAX) packet Take 17 g by mouth 2 (  two) times daily. Patient taking differently: Take 17 g by mouth See admin instructions. Mix 17 grams into 4-8 ounces of liquid and drink two times a day 01/24/17  Yes Babish, Molli Hazard, [name]  potassium chloride (KLOR-CON) 20 MEQ packet Take 20 mEq by mouth daily.   Yes [provider]  PRESCRIPTION MEDICATION BiPAP: At bedtime   Yes [provider]  rivaroxaban (XARELTO) 20 MG TABS tablet Take 1 tablet (20 mg total) by mouth daily. 08/05/15  Yes Hilty, Lisette Abu, MD    rosuvastatin (CRESTOR) 20 MG tablet TAKE ONE TABLET BY MOUTH ONCE A WEEK, FRIDAY Patient taking differently: Take 20 mg by mouth every Friday.  12/24/16  Yes Hilty, Lisette Abu, MD  simethicone (MYLICON) 125 MG chewable tablet Chew 125 mg by mouth 3 (three) times daily. FOR 5 DAYS 03/19/2019 03/26/19 Yes [provider]  SPIRIVA HANDIHALER 18 MCG inhalation capsule INHALE ONE PUFF BY MOUTH ONCE DAILY Patient taking differently: Place 18 mcg into inhaler and inhale daily.  03/18/16  Yes [name], MD  tamsulosin (FLOMAX) 0.4 MG CAPS capsule TAKE 1 CAPSULE BY MOUTH ONCE DAILY Patient taking differently: Take 0.4 mg by mouth daily.  12/24/16  Yes [name], MD  traMADol (ULTRAM) 50 MG tablet Take 1-2 tablets (50-100 mg total) by mouth every 12 (twelve) hours as needed for moderate pain or severe pain (and before hydrotherapy). Patient taking differently: Take 50 mg by mouth at bedtime.  05/28/17  Yes Tyrone Nine, MD  Magnesium 250 MG TABS Take 250 mg by mouth daily.     [provider]  Melatonin 3 MG CAPS Take 3 mg by mouth as needed (insomnia).    [provider]  Multiple Vitamin (MULTIVITAMIN WITH MINERALS) TABS tablet Take 1 tablet by mouth daily.    [provider]  protein supplement shake (PREMIER PROTEIN) LIQD Take 325 mLs (11 oz total) by mouth daily. Patient not taking: Reported on 03/20/2019 02/21/17   Tyrone Nine, MD     Vital Signs: BP (!) 125/57 (BP Location: Right Arm)    Pulse 81    Temp 98.7 F (37.1 C) (Oral)    Resp (!) 25    Ht  (1.778 m)    Wt 220 lb 14.4 oz (100.2 kg)    SpO2 96%    BMI 31.70 kg/m   Physical Exam Vitals signs and nursing note reviewed.  Constitutional:      Appearance: He is ill-appearing.  Cardiovascular:     Rate and Rhythm: Normal rate.  Pulmonary:     Comments: On BiPAP Abdominal:     General: There is no distension.     Palpations: Abdomen is soft.     Tenderness: There is no abdominal  tenderness.  Skin:    General: Skin is warm and dry.     Comments: (+) left PCN to gravity with scant clear, light yellow urine - at top of collection bag there appears to be some dried residual blood (+) left perinephric drain labeled #1 with scant cloudy yellow output in JP. Suction in tact. (+) left perinephric drain labeled #2 with scant serosanguineous output in JP. Suction in tact.   Neurological:     Mental Status: He is alert. Mental status is at baseline.     Imaging: Ct Abdomen Pelvis W Contrast  Result Date: 04/02/2019 CLINICAL DATA:  Patient admitted with sepsis found to have a left-sided obstructing pelvic stone with hydronephrosis and associated perinephric  fluid collections for which the patient underwent image guided placement of a left-sided percutaneous nephrostomy catheter on 03/22/2019 as well as CT-guided placement of a perinephric fluid collection on both 03/22/2019 as well as 03/28/2019. Repeat CT imaging is performed secondary to lack of output from the percutaneous drainage catheters. EXAM: CT ABDOMEN AND PELVIS WITH CONTRAST TECHNIQUE: Multidetector CT imaging of the abdomen and pelvis was performed using the standard protocol following bolus administration of intravenous contrast. CONTRAST:  OMNIPAQUE IOHEXOL 300 MG/ML  SOLN COMPARISON:  CT abdomen and pelvis-04/14/2019; image guided left-sided percutaneous nephrostomy catheter placement-03/22/2019; CT-guided percutaneous catheter placement-03/22/2019; 03/28/2019 FINDINGS: Lower chest: Limited visualization of the lower thorax demonstrates interval increase in small left and trace right-sided pleural effusion with associated worsening bibasilar consolidative opacities. Cardiomegaly. Coronary artery calcifications. Calcifications of the aortic valve root. No pericardial effusion. Hepatobiliary: Normal hepatic contour. There is a punctate subcentimeter hypoattenuating lesion within the right lobe of the liver (image 18,  series 3), which is too small to adequately characterize of favored to represent a hepatic cyst. No discrete worrisome hepatic lesions. Normal appearance of the gallbladder given underdistention. No intra or extrahepatic biliary duct dilatation. No ascites. Pancreas: The pancreas appears somewhat atrophic but otherwise normal. Spleen: Normal appearance of the spleen. Note is made of a small splenule. Adrenals/Urinary Tract: There is slightly delayed enhancement and excretion of the left kidney in comparison to the right. Interval retraction of left-sided percutaneous nephrostomy catheter external to the left renal collecting system with development of recurrent moderate left-sided pelvicaliectasis Interval retraction of both left-sided perinephric abscess drainage catheters with Re Q malacia of perinephric fluid with dominant collection about posteromedial aspect of the left kidney measuring approximately 4.5 x 2.1 cm and bilobed collection about the more inferior aspect of the left kidney measuring approximately 7.6 x 3.0 cm, similar to presentation CT scan performed 04/02/2019. Redemonstrated clustered stones within the left renal pelvis and superior pole of the left kidney. No evidence of right-sided nephrolithiasis or urinary obstruction. Redemonstrated approximately 4.1 x 3.2 cm hypoattenuating nonenhancing minimally complex cyst arising from the superomedial aspect the right kidney (image 32, series 3; image 8, series 8). No discrete right-sided renal lesions. Normal appearance of the bilateral adrenal glands. There is thickening involving the lateral aspects of the urinary bladder wall. Stomach/Bowel: Moderate to large colonic stool burden without evidence of enteric obstruction. Normal appearance of the terminal ileum and appendix. No pneumoperitoneum, pneumatosis or portal venous gas. Vascular/Lymphatic: Probably there is a large calcified atherosclerotic amount of irregular plaque through a tortuous but  normal caliber abdominal aorta. The major branch vessels of the abdominal aorta, including the IMA, appear patent on this non CTA examination. No bulky retroperitoneal, mesenteric, pelvic or inguinal lymphadenopathy. Reproductive: Dystrophic calcifications within normal sized prostate gland. No free fluid the pelvic cul-de-sac. Other: Mild diffuse body wall anasarca. Tiny mesenteric fat containing periumbilical hernia. Large (at least 7.2 x 3.6 x 9.8 mesenteric fat containing indirect right-sided inguinal hernia. Musculoskeletal: Redemonstrates sacral wound with associated absence/bony erosion of the distal end of the sacrum and partial absence of the coccyx (image 32, series 7). Post left total hip replacement. Unchanged severe (approximately 75%) L3 compression deformity, similar to presentation CT scan performed 04/12/2019. IMPRESSION: 1. Interval retraction of left-sided percutaneous nephrostomy catheter outside the left renal collecting system with recurrent moderate left-sided pelvicaliectasis. 2. Interval retraction/malpositioning of both perinephric abscess drainage catheters with recurrence of bilateral perinephric fluid collections. 3. Unchanged left-sided nephrolithiasis. 4. Slight increase in small left and  trace right-sided pleural effusions. 5. Redemonstrated sacral wound with associated bony erosion involving the distal tip of the sacrum and coccyx. 6. Unchanged severe (approximately 75%) L3 compression deformity with persistent fracture line. Correlation for point tenderness at this location is advised. Further evaluation with lumbar spine MRI could performed as indicated. 7. Moderate to large colonic stool burden without evidence of enteric obstruction. 8.  Aortic Atherosclerosis (ICD10-I70.0). Electronically Signed   By: Simonne Come M.D.   On: 04/02/2019 16:51   Ir Sinus/fist Tube Chk-non Gi  Result Date: 04/02/2019 INDICATION: Patient admitted on 04/04/2019 with sepsis found to have  obstructing left-sided nephrolithiasis, for which the patient underwent image guided placement of a left-sided percutaneous drainage catheter and CT-guided placement of a perinephric abscess drainage catheter 03/22/2019. Patient underwent subsequent additional perinephric abscess drainage catheter placement on 03/28/2019. Unfortunately, CT scan performed earlier today demonstrates retraction of the percutaneous nephrostomy catheter outside the left renal collecting system as well as both perinephric drains outside of both perinephric fluid collections. EXAM: 1. ATTEMPTED NEPHROSTOGRAM VIA EXISTING NEPHROSTOMY CATHETER WITH SUBSEQUENT REMOVAL. 2. IMAGE GUIDED LEFT-SIDED PERCUTANEOUS NEPHROSTOMY CATHETER PLACEMENT 3. LEFT-SIDED PERINEPHRIC ABSCESS DRAINAGE CATHETER INJECTION X2 COMPARISON:  CT abdomen pelvis-earlier same day; 03/27/2019; 03/27/2019; image guided left-sided percutaneous nephrostomy catheter placement-03/22/2019; CT-guided left-sided perinephric abscess drainage catheter placement-03/22/2019; 03/28/2019 MEDICATIONS: Patient is currently admitted to the hospital receiving intravenous antibiotics; the antibiotic was administered in an appropriate time frame prior to skin puncture. ANESTHESIA/SEDATION: Moderate (conscious) sedation was employed during this procedure. A total of Fentanyl 37.5 mcg was administered intravenously. Moderate Sedation Time: 20 minutes. The patient's level of consciousness and vital signs were monitored continuously by radiology nursing throughout the procedure under my direct supervision. CONTRAST:  20 cc mL Isovue 300 administered into the left collecting system as well as the malpositioned left-sided percutaneous nephrostomy catheter as well as both perinephric abscess drainage catheters. FLUOROSCOPY TIME:  5 minutes, 42 seconds (146 mGy) COMPLICATIONS: None immediate. PROCEDURE: The procedure, risks, benefits, and alternatives were explained to the patient. Questions regarding  the procedure were encouraged and answered. The patient understands and consents to the procedure. A timeout was performed prior to the initiation of the procedure. The left flank region as well as the external portions of the left-sided nephrostomy catheter as well as both perinephric drainage catheters were prepped with Betadine in a sterile fashion, and a sterile drape was applied covering the operative field. A sterile gown and sterile gloves were used for the procedure. Local anesthesia was provided with 1% Lidocaine with epinephrine. Preprocedural spot fluoroscopic image was obtained of the left upper abdomen as well as the existing left-sided percutaneous nephrostomy catheter in both perinephric abscess drains. Contrast was injected via the existing perinephric abscess drainage catheters however failed to delineate a patent tract to the location of the perinephric abscesses. As such, both drainage catheters cut and intact. Contrast was then injected via the existing malposition nephrostomy catheter however also failed to delineate patency of nephrostomy tract. As such, the external portion of the nephrostomy catheter was cut and nephrostomy catheter was removed intact. Ultrasound was used to localize the left kidney. Under direct ultrasound guidance, a 21 gauge needle was advanced into the renal collecting system. An ultrasound image documentation was performed. Access within the collecting system was confirmed with the efflux of urine followed by contrast injection. A Nitrex wire was advanced into the left renal collecting system however there is difficulty advancing the Accustick over the Nitrex wire due to the depth of  the kidney and associated patient body habitus. As such, a posteroinferior calyx, which was still opacified from contrast-enhanced CT scan performed earlier today, was targeted fluoroscopically with a 22 gauge Chiba needle. Appropriate access to the left renal collecting system was confirmed  with injection contrast advancement of a Nitrex wire into the left renal collecting system. Under fluoroscopic guidance, the access needle was exchanged for an Accustick set was advanced to the level of the left renal pelvis. Contrast injection was performed. Over a short Amplatz wire, the track was dilated allowing placement of a 10 French percutaneous nephrostomy catheter with end coiled and locked left renal pelvis. A small amount of contrast was injected demonstrating appropriate position functionality of the nephrostomy catheter The nephrostomy catheter was secured to the skin entrance site with 2 interrupted sutures and a Stat device. The nephrostomy with connected to a gravity bag a dressing was applied. The patient tolerated the procedure well without immediate postprocedural complication. FINDINGS: Fluoroscopic guided injection both perinephric drainage catheters demonstrates lack a residual subcutaneous track to the level of the known residual/recurrent perinephric abscesses. As such, both percutaneous drainage catheters were cut and removed intact. Fluoroscopic guided injection of the existing malposition of proximally catheter demonstrates lack of residual subcutaneous track to the level of the left renal system. As such, the pre-existing nephrostomy catheter was cut and removed intact. Ultimately, under fluoroscopic guidance, a dilated posteroinferior calyx (still opacified from contrast-enhanced CT scan performed earlier today), was targeted fluoroscopically allowing placement of a 10 French percutaneous nephrostomy catheter with end coiled and locked within the left renal pelvis. IMPRESSION: 1. Malpositioned left-sided percutaneous nephrostomy catheter requiring removal and definitive new nephrostomy catheter placement. 2. Successful fluoroscopic guided placement of a new 10.2 French left-sided percutaneous nephrostomy catheter. 3. Malpositioned left-sided perinephric abscess drainage catheters  requiring new CT-guided perinephric abscess drainage catheter placement. PLAN: - The patient was escorted to CT to undergo replacement of both left-sided perinephric abscess drainage catheters. - Recommend IV only CT scan of the abdomen and pelvis once output from both perinephric abscess drainage catheters is less than 10 cc per day (excluding flushes). Recommend drainage catheter injection prior to removal to exclude communication to the left renal collecting system. - Per my discussion with the patient's urologist (Dr. Alvester MorinBell) earlier today, we will plan on attempted antegrade nephrostogram and placement of left-sided ureteral stent once patient recovers from the above procedures. Electronically Signed   By: Simonne ComeJohn  Watts M.D.   On: 04/02/2019 18:04   Ir Sinus/fist Tube Chk-non Gi  Result Date: 04/02/2019 INDICATION: Patient admitted on 04/12/2019 with sepsis found to have obstructing left-sided nephrolithiasis, for which the patient underwent image guided placement of a left-sided percutaneous drainage catheter and CT-guided placement of a perinephric abscess drainage catheter 03/22/2019. Patient underwent subsequent additional perinephric abscess drainage catheter placement on 03/28/2019. Unfortunately, CT scan performed earlier today demonstrates retraction of the percutaneous nephrostomy catheter outside the left renal collecting system as well as both perinephric drains outside of both perinephric fluid collections. EXAM: 1. ATTEMPTED NEPHROSTOGRAM VIA EXISTING NEPHROSTOMY CATHETER WITH SUBSEQUENT REMOVAL. 2. IMAGE GUIDED LEFT-SIDED PERCUTANEOUS NEPHROSTOMY CATHETER PLACEMENT 3. LEFT-SIDED PERINEPHRIC ABSCESS DRAINAGE CATHETER INJECTION X2 COMPARISON:  CT abdomen pelvis-earlier same day; 03/27/2019; 03/20/2019; image guided left-sided percutaneous nephrostomy catheter placement-03/22/2019; CT-guided left-sided perinephric abscess drainage catheter placement-03/22/2019; 03/28/2019 MEDICATIONS: Patient is  currently admitted to the hospital receiving intravenous antibiotics; the antibiotic was administered in an appropriate time frame prior to skin puncture. ANESTHESIA/SEDATION: Moderate (conscious) sedation was employed during this procedure.  A total of Fentanyl 37.5 mcg was administered intravenously. Moderate Sedation Time: 20 minutes. The patient's level of consciousness and vital signs were monitored continuously by radiology nursing throughout the procedure under my direct supervision. CONTRAST:  20 cc mL Isovue 300 administered into the left collecting system as well as the malpositioned left-sided percutaneous nephrostomy catheter as well as both perinephric abscess drainage catheters. FLUOROSCOPY TIME:  5 minutes, 42 seconds (146 mGy) COMPLICATIONS: None immediate. PROCEDURE: The procedure, risks, benefits, and alternatives were explained to the patient. Questions regarding the procedure were encouraged and answered. The patient understands and consents to the procedure. A timeout was performed prior to the initiation of the procedure. The left flank region as well as the external portions of the left-sided nephrostomy catheter as well as both perinephric drainage catheters were prepped with Betadine in a sterile fashion, and a sterile drape was applied covering the operative field. A sterile gown and sterile gloves were used for the procedure. Local anesthesia was provided with 1% Lidocaine with epinephrine. Preprocedural spot fluoroscopic image was obtained of the left upper abdomen as well as the existing left-sided percutaneous nephrostomy catheter in both perinephric abscess drains. Contrast was injected via the existing perinephric abscess drainage catheters however failed to delineate a patent tract to the location of the perinephric abscesses. As such, both drainage catheters cut and intact. Contrast was then injected via the existing malposition nephrostomy catheter however also failed to delineate  patency of nephrostomy tract. As such, the external portion of the nephrostomy catheter was cut and nephrostomy catheter was removed intact. Ultrasound was used to localize the left kidney. Under direct ultrasound guidance, a 21 gauge needle was advanced into the renal collecting system. An ultrasound image documentation was performed. Access within the collecting system was confirmed with the efflux of urine followed by contrast injection. A Nitrex wire was advanced into the left renal collecting system however there is difficulty advancing the Accustick over the Nitrex wire due to the depth of the kidney and associated patient body habitus. As such, a posteroinferior calyx, which was still opacified from contrast-enhanced CT scan performed earlier today, was targeted fluoroscopically with a 22 gauge Chiba needle. Appropriate access to the left renal collecting system was confirmed with injection contrast advancement of a Nitrex wire into the left renal collecting system. Under fluoroscopic guidance, the access needle was exchanged for an Accustick set was advanced to the level of the left renal pelvis. Contrast injection was performed. Over a short Amplatz wire, the track was dilated allowing placement of a 10 French percutaneous nephrostomy catheter with end coiled and locked left renal pelvis. A small amount of contrast was injected demonstrating appropriate position functionality of the nephrostomy catheter The nephrostomy catheter was secured to the skin entrance site with 2 interrupted sutures and a Stat device. The nephrostomy with connected to a gravity bag a dressing was applied. The patient tolerated the procedure well without immediate postprocedural complication. FINDINGS: Fluoroscopic guided injection both perinephric drainage catheters demonstrates lack a residual subcutaneous track to the level of the known residual/recurrent perinephric abscesses. As such, both percutaneous drainage catheters were  cut and removed intact. Fluoroscopic guided injection of the existing malposition of proximally catheter demonstrates lack of residual subcutaneous track to the level of the left renal system. As such, the pre-existing nephrostomy catheter was cut and removed intact. Ultimately, under fluoroscopic guidance, a dilated posteroinferior calyx (still opacified from contrast-enhanced CT scan performed earlier today), was targeted fluoroscopically allowing placement of a 10  French percutaneous nephrostomy catheter with end coiled and locked within the left renal pelvis. IMPRESSION: 1. Malpositioned left-sided percutaneous nephrostomy catheter requiring removal and definitive new nephrostomy catheter placement. 2. Successful fluoroscopic guided placement of a new 10.2 French left-sided percutaneous nephrostomy catheter. 3. Malpositioned left-sided perinephric abscess drainage catheters requiring new CT-guided perinephric abscess drainage catheter placement. PLAN: - The patient was escorted to CT to undergo replacement of both left-sided perinephric abscess drainage catheters. - Recommend IV only CT scan of the abdomen and pelvis once output from both perinephric abscess drainage catheters is less than 10 cc per day (excluding flushes). Recommend drainage catheter injection prior to removal to exclude communication to the left renal collecting system. - Per my discussion with the patient's urologist (Dr. Alvester Morin) earlier today, we will plan on attempted antegrade nephrostogram and placement of left-sided ureteral stent once patient recovers from the above procedures. Electronically Signed   By: Simonne Come M.D.   On: 04/02/2019 18:04   Ct Image Guided Fluid Drain By Catheter  Result Date: 04/03/2019 INDICATION: Recent admission with urosepsis, post left-sided percutaneous nephrostomy catheter placement on 03/22/2019 as well as CT guided perinephric abscess drainage catheter placement on 03/22/2019 and again on 03/28/2019.  Unfortunately, CT scan of the abdomen pelvis performed earlier today retraction of the nephrostomy catheter outside the left renal collecting system as well as retraction of both perinephric abscess drainage catheters outside the residual perinephric fluid collection/abscesses. Patient has undergone image guided placement of a new left-sided percutaneous nephrostomy catheter earlier today however attempted fluoroscopic guided replacement of the perinephric drains was not possible due to lack of a subcutaneous track. As such, patient now presents for CT guided replacement of perinephric abscess drainage catheters. EXAM: CT IMAGE GUIDED FLUID DRAIN BY CATHETER x2 COMPARISON:  Image guided percutaneous nephrostomy catheter placement-03/22/2019; earlier same day; CT-guided percutaneous drainage catheter placement-03/22/2019; 03/28/2019; CT abdomen pelvis-earlier same day; 03/27/2019; 04-19-2019 MEDICATIONS: The patient is currently admitted to the hospital and receiving intravenous antibiotics. The antibiotics were administered within an appropriate time frame prior to the initiation of the procedure. ANESTHESIA/SEDATION: None - the patient was still sedated from medications administered during preceding nephrostomy placement CONTRAST:  None COMPLICATIONS: None immediate. PROCEDURE: Informed written consent was obtained from the patient after a discussion of the risks, benefits and alternatives to treatment. The patient was placed supine, slightly RPO on the CT gantry and a pre procedural CT was performed re-demonstrating the known abscess/fluid collections within the left perinephric space with dominant collection about the posteromedial aspect the left kidney measuring approximately 4.4 by 1.8 cm (image 59, series 3 and additional bilobed collection about the more caudal aspect of the left kidney measuring approximately 7.7 x 3.0 cm (image 63, series 2). The procedure was planned. A timeout was performed prior to the  initiation of the procedure. The skin overlying the left lateral abdomen was prepped and draped in the usual sterile fashion. The overlying soft tissues were anesthetized with 1% lidocaine with epinephrine. Appropriate trajectory was planned with the use of a 22 gauge spinal needles. Next, 18 gauge trocar needles were advanced into both perinephric abscess fluid collections under intermittent CT guidance. Short Amplatz wires were coiled within both collections. Appropriate positioning was confirmed with a limited CT scan. The tracks were serially dilated allowing placement of 10.2 French drainage catheters into both residual/recurrent perinephric fluid collections. The dominant caudal collection yielded approximately 10 cc of purulent material while the smaller collection yielded approximately 5 cc of purulent material. Both drainage  catheters were flushed with a small amount of saline and connected to JP bulbs, sutured in place. Dressings and StatLock devices were applied. The patient tolerated the procedure well without immediate post procedural complication. IMPRESSION: Successful CT guided placement of a 10 French all purpose drain catheter into residual/recurrent perinephric abscesses with dominant collection yielding 10 cc of purulent fluid and smaller collection yielding 5 cc of purulent fluid. PLAN: - Recommend IV only CT scan the abdomen pelvis 1 output from both perinephric abscess drainage catheters is less than 10 cc per day (excluding flush). - Recommend drainage catheter injection prior to removal to exclude communication to the left renal collecting system. Electronically Signed   By: Sandi Mariscal M.D.   On: 04/03/2019 07:54   Ct Image Guided Fluid Drain By Catheter  Result Date: 04/03/2019 INDICATION: Recent admission with urosepsis, post left-sided percutaneous nephrostomy catheter placement on 03/22/2019 as well as CT guided perinephric abscess drainage catheter placement on 03/22/2019 and  again on 03/28/2019. Unfortunately, CT scan of the abdomen pelvis performed earlier today retraction of the nephrostomy catheter outside the left renal collecting system as well as retraction of both perinephric abscess drainage catheters outside the residual perinephric fluid collection/abscesses. Patient has undergone image guided placement of a new left-sided percutaneous nephrostomy catheter earlier today however attempted fluoroscopic guided replacement of the perinephric drains was not possible due to lack of a subcutaneous track. As such, patient now presents for CT guided replacement of perinephric abscess drainage catheters. EXAM: CT IMAGE GUIDED FLUID DRAIN BY CATHETER x2 COMPARISON:  Image guided percutaneous nephrostomy catheter placement-03/22/2019; earlier same day; CT-guided percutaneous drainage catheter placement-03/22/2019; 03/28/2019; CT abdomen pelvis-earlier same day; 03/27/2019; 03/28/2019 MEDICATIONS: The patient is currently admitted to the hospital and receiving intravenous antibiotics. The antibiotics were administered within an appropriate time frame prior to the initiation of the procedure. ANESTHESIA/SEDATION: None - the patient was still sedated from medications administered during preceding nephrostomy placement CONTRAST:  None COMPLICATIONS: None immediate. PROCEDURE: Informed written consent was obtained from the patient after a discussion of the risks, benefits and alternatives to treatment. The patient was placed supine, slightly RPO on the CT gantry and a pre procedural CT was performed re-demonstrating the known abscess/fluid collections within the left perinephric space with dominant collection about the posteromedial aspect the left kidney measuring approximately 4.4 by 1.8 cm (image 59, series 3 and additional bilobed collection about the more caudal aspect of the left kidney measuring approximately 7.7 x 3.0 cm (image 63, series 2). The procedure was planned. A timeout was  performed prior to the initiation of the procedure. The skin overlying the left lateral abdomen was prepped and draped in the usual sterile fashion. The overlying soft tissues were anesthetized with 1% lidocaine with epinephrine. Appropriate trajectory was planned with the use of a 22 gauge spinal needles. Next, 18 gauge trocar needles were advanced into both perinephric abscess fluid collections under intermittent CT guidance. Short Amplatz wires were coiled within both collections. Appropriate positioning was confirmed with a limited CT scan. The tracks were serially dilated allowing placement of 10.2 French drainage catheters into both residual/recurrent perinephric fluid collections. The dominant caudal collection yielded approximately 10 cc of purulent material while the smaller collection yielded approximately 5 cc of purulent material. Both drainage catheters were flushed with a small amount of saline and connected to JP bulbs, sutured in place. Dressings and StatLock devices were applied. The patient tolerated the procedure well without immediate post procedural complication. IMPRESSION: Successful CT guided placement  of a 10 [place] all purpose drain catheter into residual/recurrent perinephric abscesses with dominant collection yielding 10 cc of purulent fluid and smaller collection yielding 5 cc of purulent fluid. PLAN: - Recommend IV only CT scan the abdomen pelvis 1 output from both perinephric abscess drainage catheters is less than 10 cc per day (excluding flush). - Recommend drainage catheter injection prior to removal to exclude communication to the left renal collecting system. Electronically Signed   By: Simonne Come M.D.   On: 04/03/2019 07:54    Labs:  CBC: Recent Labs    04/03/19 0351 04/04/19 0530 04/05/19 0437 04/06/19 0420  WBC 7.1 6.7 6.5 6.6  HGB 8.7* 8.4* 8.7* 9.0*  HCT 28.6* 27.9* 29.8* 29.9*  PLT 317 290 306 313    COAGS: Recent Labs    03/22/19 0759 03/24/19 1241    INR 1.1  --   APTT  --  36    BMP: Recent Labs    03/28/19 0336 03/29/19 1027 03/30/19 0451 03/31/19 0521  NA 140 141 139 139  K 3.8 3.8 4.3 4.4  CL 97* 98 96* 93*  CO2 36* 34* 37* 37*  GLUCOSE 103* 118* 108* 107*  BUN 18 12 19 21   CALCIUM 8.9 8.9 9.0 9.3  CREATININE 0.58* 0.54* 0.55* 0.61  GFRNONAA >60 >60 >60 >60  GFRAA >60 >60 >60 >60    LIVER FUNCTION TESTS: Recent Labs    04/14/2019 1749  BILITOT 0.4  AST 19  ALT 15  ALKPHOS 64  PROT 5.6*  ALBUMIN 2.0*    Assessment and Plan:  85 y/o M s/p left PCN and left perinephric drains x 2 seen for follow up today. No issues reported with drains or PCN, patient remains on BiPAP this morning and is somnolent but arousable, denies pain.   Drain output stable, no issue reported with flushing. Tmax 98.9, WBC 6.6, hgb 9.0, plt 313. Currently receiving Rocephin 2g Q24H per primary team.  Continue current management - TID flushes with 3-5 cc NS, record output qshift, dressing changes QD, call IR if unable to flush drains. Will consider repeat CT abd/pelvis with IV contrast/possible drain injection when output <10 cc/QD (exlcuding flush material).  IR will continue to follow. Please call with questions or concerns.   Electronically Signed: Villa Herb, [name] 04/06/2019, 10:01 AM   I spent a total of 15 Minutes at the the patient's bedside AND on the patient's hospital floor or unit, greater than 50% of which was counseling/coordinating care for follow up left PCN and left perinephric drains x2.

## 2019-04-06 NOTE — Progress Notes (Signed)
ABG results received from [place] in the lab. Results given to Dr. Sloan Leiter. No new orders at this time. RT will continue to monitor.

## 2019-04-06 NOTE — Progress Notes (Signed)
ANTICOAGULATION CONSULT NOTE  Pharmacy Consult:  Heparin Indication: atrial fibrillation  Allergies  Allergen Reactions  . Nsaids Other (See Comments)    Stomach pain  . Morphine And Related Nausea And Vomiting and Other (See Comments)    Dizziness, light-headedness and "Opiates cause tightness in chest"  . Vicodin [Hydrocodone-Acetaminophen] Anxiety    Patient Measurements: Height: 5\' 10"  (177.8 cm) Weight: 220 lb 14.4 oz (100.2 kg) IBW/kg (Calculated) : 73 Heparin Dosing Weight: 92.3kg   Vital Signs: Temp: 98.7 F (37.1 C) (11/20 0844) Temp Source: Oral (11/20 0844) BP: 130/51 (11/20 1253) Pulse Rate: 61 (11/20 1253)  Labs: Recent Labs    04/04/19 0530 04/05/19 0437 04/06/19 0420  HGB 8.4* 8.7* 9.0*  HCT 27.9* 29.8* 29.9*  PLT 290 306 313  HEPARINUNFRC 0.44 0.51 0.44    Estimated Creatinine Clearance: 74.3 mL/min (by C-G formula based on SCr of 0.61 mg/dL).   Assessment: 55 yoM admitted on 04/10/19 with perinephric fluid collection requiring nephrostomy tube. Pt on Xarelto PTA for hx AFib (CHADSVASc = 3) which has been held with need for procedures and bridged with IV heparin.   Heparin level is therapeutic and stable.  Hematuria improving per MD note.  Goal of Therapy:  Heparin level 0.3-0.7 units/ml Monitor platelets by anticoagulation protocol: Yes   Plan:  Continue heparin at 2050 units/hr Daily heparin level and CBC  Kambri Dismore D. Mina Marble, PharmD, BCPS, [place] 04/06/2019, 1:17 PM

## 2019-04-06 NOTE — Progress Notes (Signed)
Family meeting: Met with patient's daughter Ms. Mery at the bedside.  Patient since last 24 hours less interactive.  Persistent hypercapnic.  Probably aspirating.  Chest x-ray confirms aspiration pneumonia.  WBC count is normal.  Afebrile.  Already on Rocephin for UTI.  Already on dysphagia 2 diet due to chronic aspiration. We discussed poor recovery.  Probably patient may not be able to recover and rehab.  Will broaden antibiotic coverage, add metronidazole. BiPAP continues and at night in order to improve his CO2 levels. Patient is DO NOT INTUBATE, okay for resuscitation and cardioversion. Will see patient response with BIPAP, continue antibiotics, continue heparin. I will consult palliative care medicine team to coordinate care with family, updated daughter .  patient may ultimately need palliation.

## 2019-04-06 NOTE — TOC Progression Note (Signed)
Transition of Care [facility]) - Progression Note    Patient Details  Name: [name] MRN: [id number] Date of Birth: [birth year]  [facility] CM/SW [place], [place] Phone Number: 04/06/2019, 2:52 PM  Clinical Narrative:     Patient still not medically stable. Spoke with MD, patient still on heparin drip and has his drains. Possibly medically ready on Tuesday.   CSW updated [place] with [employer].  Should have no weekend needs, CSW will continue to follow and assist as needed.    Expected Discharge Plan: [place] Barriers to Discharge: Continued Medical Work up  Expected Discharge Plan and Services Expected Discharge Plan: [place] In-house Referral: Clinical Social Work Discharge Planning Services: NA Post Acute Care Choice: [place] Living arrangements for the past 2 months: [place]                 DME Arranged: N/A DME Agency: NA       HH Arranged: NA HH Agency: NA         Social Determinants of Health (SDOH) Interventions    Readmission Risk Interventions No flowsheet data found.

## 2019-04-06 NOTE — Care Management Important Message (Signed)
Important Message  Patient Details  Name: GREGOREY NABOR MRN: [id number] Date of Birth: 07-Sep-1926   Medicare Important Message Given:  Yes     Shelda Altes 04/06/2019, 1:43 PM

## 2019-04-06 NOTE — Progress Notes (Signed)
PROGRESS NOTE    [name]  YCX:[id number] DOB: 06-11-1926 DOA: March 30, 2019 PCP: System, Pcp Not In    Brief Narrative:  86 y.o.malewith medical history significant foratrial fibrillation on Xarelto, chronic diastolic CHF, asthma/COPD, OSA on BiPAP, hypotension on midodrine, and chronic sacral ulcer, who was brought to the emergency department for evaluation of progressive abdominal distention with alternating constipation and watery stool.  Found to have perinephric fluid collection requiring nephrostomy tube. Patient is from long-term nursing home.  He used to walk with a walker and not done so for at least 6 months now.  Mostly wheelchair-bound. Underwent multiple procedures , percutaneous nephrostomy tube on 11/5, 11/11, 04/02/2019 with radiology. Remains in the hospital to complete treatment.   Assessment & Plan:   Principal Problem:   Perinephric fluid collection Active Problems:   Asthma   Paroxysmal Atrial fibrillation - admitted with RVR   Chronic diastolic CHF (congestive heart failure) (HCC)   AF (paroxysmal atrial fibrillation) (HCC)   Hypokalemia   Ileus (HCC)   Closed compression fracture of L4 vertebra (HCC)   Sacral pressure ulcer   Renal cyst, left  Abdominal pain secondary to perinephric fluid collection/left-sided hydronephrosis with urinoma/complicated UTI: 03/22/2019, superior pole nephrostomy tube placed. 03/28/2019, inferior pole nephrostomy tube placed due to collection not drained. 04/02/2019, inadequate drainage and persistent collection, new nephrostomy tubes placed x 3 .  Initial urine culture with Proteus.  Blood cultures negative.  Still has inadequately drained infection, will continue ceftriaxone until surgical procedures are done. Patient will need antegrade ureterogram and stenting as per IR schedule. Now draining adequate. Followed by interventional radiology.  Hematuria due to coagulopathy: Patient developed gross hematuria and now  improved.  Continue on heparin  Until anticipated procedures.  Will change to Xarelto when no more surgery needed.   Chronic atrial fibrillation: Rate controlled.  On amiodarone.  in sinus rhythm.  Was on Xarelto prior to admission that is on hold pending clinical improvement.  Now on heparin.  Hypotension: Blood pressures fairly stable.  Patient on midodrine that he will continue.  Chronic sacral pressure ulcer stage IV present on admission: Followed by wound care. Stable.   Obstructive sleep apnea: Patient is on BiPAP.  Uses BiPAP at night. Remain hypercarbic, repeat ABG shows inadequate ventilation.  We will try V 60 machine today.  Discussed with respiratory therapist.   Use also in the daytime with naps and consistently at night times.    Deconditioning/long-term nursing home resident: Work with PT OT.  Unsure whether he is rehab candidate.  Needs to go back to skilled nursing facility.  Patient will ultimately need ureteric stent by IR once drains are dry, he needs to stay on heparin bridging, will stay in the hospital.  DVT prophylaxis: Heparin infusion Code Status: Partial Family Communication: patient's daughter at bedside. Disposition Plan: Back to nursing home when stable and needing no more surgeries.   Consultants:   Urology  Interventional radiology  Procedures:   Percutaneous drain, 11/5, 11/11 left kidney  Percutaneous drain, nephrostomy 04/02/2019  Antimicrobials:   Rocephin, 03-30-19---   Subjective: Seen and examined.  No overnight events. Sinus rhythm. No more hematuria.  Pain is controlled. Noted cough with hypoxia last night.  Suspect inadequate ventilation with BiPAP.   Objective: Vitals:   04/06/19 0010 04/06/19 0545 04/06/19 0844 04/06/19 0918  BP:  (!) 130/57 (!) 125/57   Pulse: 68 75 79 81  Resp:  18 19 (!) 25  Temp:  98.9 F (37.2 C)  98.7 F (37.1 C)   TempSrc:  Oral Oral   SpO2: 94% 94% 92% 96%  Weight:  100.2 kg    Height:         Intake/Output Summary (Last 24 hours) at 04/06/2019 1111 Last data filed at 04/06/2019 1054 Gross per 24 hour  Intake 1077.02 ml  Output 2320 ml  Net -1242.98 ml   Filed Weights   04/04/19 0430 04/05/19 0416 04/06/19 0545  Weight: 99.3 kg 100.2 kg 100.2 kg    Examination: Physical Exam  Constitutional: He is oriented to person, place, and time.  Chronically sick looking.  Frail old man appropriate for his age.  On 4 L oxygen.  HENT:  Head: Normocephalic and atraumatic.  Eyes: Pupils are equal, round, and reactive to light.  Neck: Neck supple.  Cardiovascular: Normal rate and regular rhythm.  Pulmonary/Chest:  Conducted airway sounds.  Abdominal: Bowel sounds are normal.  Obese pendulous.  No localized tenderness.   Percutaneous tubes with clear urine.. Condom catheter with clear urine.  Neurological: He is alert and oriented to person, place, and time.  Psychiatric: Mood normal.      Data Reviewed: I have personally reviewed following labs and imaging studies  CBC: Recent Labs  Lab 04/02/19 0551 04/03/19 0351 04/04/19 0530 04/05/19 0437 04/06/19 0420  WBC 7.7 7.1 6.7 6.5 6.6  HGB 9.2* 8.7* 8.4* 8.7* 9.0*  HCT 29.3* 28.6* 27.9* 29.8* 29.9*  MCV 100.3* 102.5* 102.6* 104.9* 105.7*  PLT 315 317 290 306 220   Basic Metabolic Panel: Recent Labs  Lab 03/31/19 0521  NA 139  K 4.4  CL 93*  CO2 37*  GLUCOSE 107*  BUN 21  CREATININE 0.61  CALCIUM 9.3   GFR: Estimated Creatinine Clearance: 74.3 mL/min (by C-G formula based on SCr of 0.61 mg/dL). Liver Function Tests: No results for input(s): AST, ALT, ALKPHOS, BILITOT, PROT, ALBUMIN in the last 168 hours. No results for input(s): LIPASE, AMYLASE in the last 168 hours. No results for input(s): AMMONIA in the last 168 hours. Coagulation Profile: No results for input(s): INR, PROTIME in the last 168 hours. Cardiac Enzymes: No results for input(s): CKTOTAL, CKMB, CKMBINDEX, TROPONINI in the last 168 hours.  BNP (last 3 results) No results for input(s): PROBNP in the last 8760 hours. HbA1C: No results for input(s): HGBA1C in the last 72 hours. CBG: No results for input(s): GLUCAP in the last 168 hours. Lipid Profile: No results for input(s): CHOL, HDL, LDLCALC, TRIG, CHOLHDL, LDLDIRECT in the last 72 hours. Thyroid Function Tests: No results for input(s): TSH, T4TOTAL, FREET4, T3FREE, THYROIDAB in the last 72 hours. Anemia Panel: No results for input(s): VITAMINB12, FOLATE, FERRITIN, TIBC, IRON, RETICCTPCT in the last 72 hours. Sepsis Labs: No results for input(s): PROCALCITON, LATICACIDVEN in the last 168 hours.  Recent Results (from the past 240 hour(s))  Aerobic/Anaerobic Culture (surgical/deep wound)     Status: None   Collection Time: 03/28/19  4:19 PM   Specimen: Abscess  Result Value Ref Range Status   Specimen Description ABSCESS PERINEPHRIC DRAIN  Final   Special Requests Normal  Final   Gram Stain   Final    RARE WBC PRESENT,BOTH PMN AND MONONUCLEAR NO ORGANISMS SEEN    Culture   Final    RARE PROTEUS MIRABILIS NO ANAEROBES ISOLATED Performed at [facility], [address], [place], [place] [address]    Report Status 04/02/2019 FINAL  Final   Organism ID, Bacteria PROTEUS MIRABILIS  Final  Susceptibility   Proteus mirabilis - MIC*    AMPICILLIN >=32 RESISTANT Resistant     CEFAZOLIN 16 SENSITIVE Sensitive     CEFEPIME <=1 SENSITIVE Sensitive     CEFTAZIDIME <=1 SENSITIVE Sensitive     CEFTRIAXONE <=1 SENSITIVE Sensitive     CIPROFLOXACIN >=4 RESISTANT Resistant     GENTAMICIN <=1 SENSITIVE Sensitive     IMIPENEM 2 SENSITIVE Sensitive     TRIMETH/SULFA >=320 RESISTANT Resistant     AMPICILLIN/SULBACTAM >=32 RESISTANT Resistant     PIP/TAZO <=4 SENSITIVE Sensitive     * RARE PROTEUS MIRABILIS  Culture, Urine     Status: None   Collection Time: 03/30/19  6:54 AM   Specimen: Urine, Random  Result Value Ref Range Status   Specimen Description URINE,  RANDOM BAG,BED  Final   Special Requests NONE  Final   Culture   Final    NO GROWTH Performed at [facility], [address], [place], [place] [address]    Report Status 03/31/2019 FINAL  Final  SARS CORONAVIRUS 2 (TAT 6-24 HRS) Nasopharyngeal Nasopharyngeal Swab     Status: None   Collection Time: 04/01/19 11:55 AM   Specimen: Nasopharyngeal Swab  Result Value Ref Range Status   SARS Coronavirus 2 NEGATIVE NEGATIVE Final    Comment: (NOTE) SARS-CoV-2 target nucleic acids are NOT DETECTED. The SARS-CoV-2 RNA is generally detectable in upper and lower respiratory specimens during the acute phase of infection. Negative results do not preclude SARS-CoV-2 infection, do not rule out co-infections with other pathogens, and should not be used as the sole basis for treatment or other patient management decisions. Negative results must be combined with clinical observations, patient history, and epidemiological information. The expected result is Negative. Fact Sheet for Patients: HairSlick.no Fact Sheet for Healthcare Providers: quierodirigir.com This test is not yet approved or cleared by the [place] FDA and  has been authorized for detection and/or diagnosis of SARS-CoV-2 by FDA under an Emergency Use Authorization (EUA). This EUA will remain  in effect (meaning this test can be used) for the duration of the COVID-19 declaration under Section 56 4(b)(1) of the Act, 21 U.S.C. section 360bbb-3(b)(1), unless the authorization is terminated or revoked sooner. Performed at [facility], [address], [place], [place] [address]          Radiology Studies: No results found.      Scheduled Meds: . amiodarone  200 mg Oral Daily  . famotidine  20 mg Oral BID  . feeding supplement (PRO-STAT SUGAR FREE 64)  30 mL Oral BID WC  . gabapentin  400 mg Oral TID  . midodrine  10 mg Oral TID WC  . multivitamin  with minerals  1 tablet Oral Daily  . nutrition supplement (JUVEN)  1 packet Oral BID BM  . potassium chloride  20 mEq Oral Daily  . Ensure Max Protein  11 oz Oral QHS  . sodium chloride flush  3 mL Intravenous Q12H  . sodium chloride flush  5 mL Intracatheter Q8H  . umeclidinium bromide  1 puff Inhalation Daily   Continuous Infusions: . sodium chloride 250 mL (04/05/19 1600)  . cefTRIAXone (ROCEPHIN)  IV 2 g (04/05/19 1601)  . heparin 2,050 Units/hr (04/06/19 0411)     LOS: 15 days    Time spent: 25 minutes    Dorcas Carrow, MD [facility] Pager [phone]

## 2019-04-07 LAB — CBC
HCT: 28.4 % — ABNORMAL LOW (ref 39.0–52.0)
Hemoglobin: 8.5 g/dL — ABNORMAL LOW (ref 13.0–17.0)
MCH: 31.6 pg (ref 26.0–34.0)
MCHC: 29.9 g/dL — ABNORMAL LOW (ref 30.0–36.0)
MCV: 105.6 fL — ABNORMAL HIGH (ref 80.0–100.0)
Platelets: 281 10*3/uL (ref 150–400)
RBC: 2.69 MIL/uL — ABNORMAL LOW (ref 4.22–5.81)
RDW: 15.3 % (ref 11.5–15.5)
WBC: 4.8 10*3/uL (ref 4.0–10.5)
nRBC: 0 % (ref 0.0–0.2)

## 2019-04-07 LAB — BASIC METABOLIC PANEL
Anion gap: 6 (ref 5–15)
BUN: 19 mg/dL (ref 8–23)
CO2: 37 mmol/L — ABNORMAL HIGH (ref 22–32)
Calcium: 9.2 mg/dL (ref 8.9–10.3)
Chloride: 100 mmol/L (ref 98–111)
Creatinine, Ser: 0.51 mg/dL — ABNORMAL LOW (ref 0.61–1.24)
GFR calc Af Amer: >60 mL/min (ref >60)
GFR calc non Af Amer: >60 mL/min (ref >60)
Glucose, Bld: 106 mg/dL — ABNORMAL HIGH (ref 70–99)
Potassium: 4 mmol/L (ref 3.5–5.1)
Sodium: 143 mmol/L (ref 135–145)

## 2019-04-07 LAB — HEPARIN LEVEL (UNFRACTIONATED): Heparin Unfractionated: 0.57 IU/mL (ref 0.30–0.70)

## 2019-04-07 LAB — PROCALCITONIN: Procalcitonin: <0.1 ng/mL

## 2019-04-07 NOTE — Progress Notes (Signed)
Updated patient's daughter via phone. All questions answered to her satisfaction. 

## 2019-04-07 NOTE — Progress Notes (Signed)
Referring Physician(s): Dr. Antionette Char  Supervising Physician: Simonne Come  Patient Status:  [facility] - In-pt  Chief Complaint: Left hydronephrosis Perinephric fluid collection  Subjective: Patient resting.  Has resumed heparin.  Urine remains dark, bloody.  Exhausted easily, but otherwise no complaints.   Allergies: Nsaids, Morphine and related, and Vicodin [hydrocodone-acetaminophen]  Medications: Prior to Admission medications   Medication Sig Start Date End Date Taking? Authorizing Provider  acetaminophen (TYLENOL) 500 MG tablet Take 2 tablets (1,000 mg total) by mouth every 8 (eight) hours. Patient taking differently: Take 1,000 mg by mouth See admin instructions. Take 1,000 mg by mouth every eight hours and do not exceed 4,000 mg/24 hours from all combined sources 01/24/17  Yes Babish, Molli Hazard, [name]  albuterol (PROVENTIL HFA;VENTOLIN HFA) 108 (90 Base) MCG/ACT inhaler Inhale 2 puffs into the lungs every 4 (four) hours as needed for wheezing or shortness of breath.    Yes [provider]  Amino Acids-Protein Hydrolys (FEEDING SUPPLEMENT, PRO-STAT SUGAR FREE 64,) LIQD Take 30 mLs by mouth 3 (three) times daily.    Yes [provider]  amiodarone (PACERONE) 200 MG tablet TAKE ONE TABLET BY MOUTH ONCE DAILY Patient taking differently: Take 200 mg by mouth daily.  10/29/16  Yes Hilty, Lisette Abu, MD  Artificial Saliva (BIOTENE DRY MOUTH MOISTURIZING) SOLN 1 spray by Mouth Rinse route every 2 (two) hours as needed (for dryness).   Yes [provider]  bumetanide (BUMEX) 1 MG tablet Take 1 tablet (1 mg total) by mouth 2 (two) times daily. 10/15/16  Yes Hilty, Lisette Abu, MD  cetirizine (ZYRTEC) 10 MG tablet Take 10 mg by mouth daily.    Yes [provider]  Cholecalciferol (VITAMIN D-3) 25 MCG (1000 UT) CAPS Take 1,000 Units by mouth daily.   Yes [provider]  docusate sodium 100 MG CAPS Take 100 mg by mouth 2 (two) times daily. 08/15/13  Yes Babish,  Molli Hazard, [name]  famotidine (PEPCID) 20 MG tablet Take 1 tablet (20 mg total) by mouth 2 (two) times daily. 02/21/17  Yes Tyrone Nine, MD  ferrous sulfate (FERROUSUL) 325 (65 FE) MG tablet Take 1 tablet (325 mg total) by mouth 3 (three) times daily with meals. Patient taking differently: Take 325 mg by mouth 2 (two) times daily with a meal.  01/24/17  Yes Babish, Molli Hazard, [name]  gabapentin (NEURONTIN) 400 MG capsule TAKE 1 CAPSULE BY MOUTH THREE TIMES DAILY Patient taking differently: Take 400 mg by mouth 3 (three) times daily.  01/14/17  Yes [name], MD  lactulose [facility]) 10 GM/15ML solution Take 20 g by mouth daily. FOR 5 DAYS 03/19/19 03/24/19 Yes [provider]  metoCLOPramide (REGLAN) 5 MG tablet Take 2.5 mg by mouth 2 (two) times daily. FOR 7 DAYS 03/19/19 03/26/19 Yes [provider]  midodrine (PROAMATINE) 10 MG tablet Take 1 tablet (10 mg total) by mouth 3 (three) times daily. 05/28/17  Yes Tyrone Nine, MD  Multiple Vitamins-Minerals (CERTAGEN PO) Take 1 tablet by mouth daily.    Yes [provider]  Multiple Vitamins-Minerals (PRESERVISION AREDS 2) CAPS Take 1 capsule by mouth 2 (two) times daily.   Yes [provider]  nutrition supplement, JUVEN, (JUVEN) PACK Take 1 packet by mouth 2 (two) times daily between meals. 02/21/17  Yes Tyrone Nine, MD  Nutritional Supplements (RESOURCE 2.0) LIQD Take 120 mLs by mouth at bedtime.   Yes [provider]  OXYGEN Inhale 2 L/min into the lungs as  needed (for shortness of breath).    Yes [provider]  polyethylene glycol (MIRALAX / GLYCOLAX) packet Take 17 g by mouth 2 (two) times daily. Patient taking differently: Take 17 g by mouth See admin instructions. Mix 17 grams into 4-8 ounces of liquid and drink two times a day 01/24/17  Yes Babish, Molli Hazard, [name]  potassium chloride (KLOR-CON) 20 MEQ packet Take 20 mEq by mouth daily.   Yes [provider]  PRESCRIPTION MEDICATION  BiPAP: At bedtime   Yes [provider]  rivaroxaban (XARELTO) 20 MG TABS tablet Take 1 tablet (20 mg total) by mouth daily. 08/05/15  Yes Hilty, Lisette Abu, MD  rosuvastatin (CRESTOR) 20 MG tablet TAKE ONE TABLET BY MOUTH ONCE A WEEK, FRIDAY Patient taking differently: Take 20 mg by mouth every Friday.  12/24/16  Yes Hilty, Lisette Abu, MD  simethicone (MYLICON) 125 MG chewable tablet Chew 125 mg by mouth 3 (three) times daily. FOR 5 DAYS 23-Mar-2019 03/26/19 Yes [provider]  SPIRIVA HANDIHALER 18 MCG inhalation capsule INHALE ONE PUFF BY MOUTH ONCE DAILY Patient taking differently: Place 18 mcg into inhaler and inhale daily.  03/18/16  Yes [name], MD  tamsulosin (FLOMAX) 0.4 MG CAPS capsule TAKE 1 CAPSULE BY MOUTH ONCE DAILY Patient taking differently: Take 0.4 mg by mouth daily.  12/24/16  Yes [name], MD  traMADol (ULTRAM) 50 MG tablet Take 1-2 tablets (50-100 mg total) by mouth every 12 (twelve) hours as needed for moderate pain or severe pain (and before hydrotherapy). Patient taking differently: Take 50 mg by mouth at bedtime.  05/28/17  Yes Tyrone Nine, MD  Magnesium 250 MG TABS Take 250 mg by mouth daily.     [provider]  Melatonin 3 MG CAPS Take 3 mg by mouth as needed (insomnia).    [provider]  Multiple Vitamin (MULTIVITAMIN WITH MINERALS) TABS tablet Take 1 tablet by mouth daily.    [provider]  protein supplement shake (PREMIER PROTEIN) LIQD Take 325 mLs (11 oz total) by mouth daily. Patient not taking: Reported on 23-Mar-2019 02/21/17   Tyrone Nine, MD     Vital Signs: BP 127/61 (BP Location: Left Arm)   Pulse 73   Temp 97.8 F (36.6 C) (Oral)   Resp 16   Ht 5\' 10"  (1.778 m)   Wt 223 lb 1.7 oz (101.2 kg)   SpO2 100%   BMI 32.01 kg/m   Physical Exam  NAD, alert Abdomen: soft, non-tender, non-distended.   Back:  PCN with small amount of yellow output, flushed with return of dark bloody output  consistent with urine in foley.  Abscess drains 1 and 2 in place. Insertion sites intact.  Flush easily.  Some difficulty aspirating, but with return of thick material. . GU: bloody urine in collection bag.   Imaging: Dg Chest Port 1 View  Result Date: 04/06/2019 CLINICAL DATA:  Altered mental status, possible aspiration. EXAM: PORTABLE CHEST 1 VIEW COMPARISON:  05/27/2017. FINDINGS: Trachea is midline. Heart is enlarged, stable. Thoracic aorta is calcified. Left mid and lower lung zone airspace consolidation with a left pleural effusion. Biapical pleural thickening. Mild interstitial prominence may be chronic. IMPRESSION: 1. Left mid and lower lung zone airspace consolidation is worrisome for pneumonia. 2. Small left pleural effusion. 3.  Aortic atherosclerosis (ICD10-170.0). Electronically Signed   By: 07/25/2017 M.D.   On: 04/06/2019 14:20    Labs:  CBC: Recent Labs    04/04/19  0530 04/05/19 0437 04/06/19 0420 04/07/19 0353  WBC 6.7 6.5 6.6 4.8  HGB 8.4* 8.7* 9.0* 8.5*  HCT 27.9* 29.8* 29.9* 28.4*  PLT 290 306 313 281    COAGS: Recent Labs    03/22/19 0759 03/24/19 1241  INR 1.1  --   APTT  --  36    BMP: Recent Labs    03/29/19 1027 03/30/19 0451 03/31/19 0521 04/07/19 0353  NA 141 139 139 143  K 3.8 4.3 4.4 4.0  CL 98 96* 93* 100  CO2 34* 37* 37* 37*  GLUCOSE 118* 108* 107* 106*  BUN 12 19 21 19   CALCIUM 8.9 9.0 9.3 9.2  CREATININE 0.54* 0.55* 0.61 0.51*  GFRNONAA >60 >60 >60 >60  GFRAA >60 >60 >60 >60    LIVER FUNCTION TESTS: Recent Labs    03-31-19 1749  BILITOT 0.4  AST 19  ALT 15  ALKPHOS 64  PROT 5.6*  ALBUMIN 2.0*    Assessment and Plan: Left hydronephrosis s/p percutaneous nephrostomy tube placement 03/22/19, >>> replaced 11/17 due to displacement Urinoma/abscess s/p drain placement by Dr. Anselm Pancoast 03/22/19 >>> replaced 11/17 due to displacement. Second perinephric fluid collection drain placement by Dr. Pascal Lux 03/28/19 >>> replaced 11/17  due to displacement  Patient with all 3 drains in place today.  With difficulty due to limited mobility and patient weakness, I was able to flush all 3 drains.  The PCN appeared to have clogged, and after flush immediately put out several mLs of dark urine.  Recommend regular flushes of catheters; 5 mL TID and record output q shift.  Vital signs stable.   IR following.     Electronically Signed: Docia Barrier, PA 04/07/2019, 1:17 PM   I spent a total of 15 Minutes at the the patient's bedside AND on the patient's hospital floor or unit, greater than 50% of which was counseling/coordinating care for left hydronephrosis, urinoma, perinephric abscesses.

## 2019-04-07 NOTE — Progress Notes (Signed)
Patient is on NIV at this time tolerating it fairly well. Patient states that "I don't want to use it, but I will give it a try" . Patient is on it now.

## 2019-04-07 NOTE — Progress Notes (Addendum)
PROGRESS NOTE  [name] UVO:[id number] DOB: 04-06-1927 DOA: 03/28/2019 PCP: System, Pcp Not In  HPI/Recap of past 24 hours: 85 y.o.malewith medical history significant foratrial fibrillation on Xarelto, chronic diastolic CHF, asthma/COPD, OSA on BiPAP, hypotension on midodrine, and chronic sacral ulcer, who was brought to the emergency department for evaluation of progressive abdominal distention with alternating constipation and watery stool. Found to have perinephric fluid collection requiring nephrostomy tube. Patient is from long-term nursing home.  He used to walk with a walker and not done so for at least 6 months now.  Mostly wheelchair-bound. Underwent multiple procedures , percutaneous nephrostomy tube on 11/5, 11/11, 04/02/2019 with radiology. Remains in the hospital to complete treatment.  04/07/19: Patient was seen and examined at his bedside this morning.  He denies dyspnea or chest pain at rest.  Denies dysuria nausea.  Admits to diffuse abdominal pain when palpated.  [employer] colored urine noted.    Assessment/Plan: Principal Problem:   Perinephric fluid collection Active Problems:   Asthma   Paroxysmal Atrial fibrillation - admitted with RVR   Chronic diastolic CHF (congestive heart failure) (HCC)   AF (paroxysmal atrial fibrillation) (HCC)   Hypokalemia   Ileus (HCC)   Closed compression fracture of L4 vertebra (HCC)   Sacral pressure ulcer   Renal cyst, left  Abdominal pain secondary to perinephric fluid collection/left-sided hydronephrosis with urinoma/complicated Proteus mirabilis UTI 03/22/2019, superior pole nephrostomy tube placed. 03/28/2019, inferior pole nephrostomy tube placed due to collection not drained. 04/02/2019, inadequate drainage and persistent collection, new nephrostomy tubes placed x 3 .  Initial urine culture with Proteus.  Blood cultures negative.  Still has inadequately drained infection, will continue ceftriaxone until surgical  procedures are done. Also on IV Flagyl which was started on 04/06/2019. Patient will need antegrade ureterogram and stenting as per IR schedule. Now draining adequate. Followed by interventional radiology. Continue current management as recommended by interventional radiology.  Gross hematuria likely secondary to coagulopathy Currently on heparin drip for CVA prevention due to chronic A. Fib Continue heparin drip due to possible procedures Planned procedures completed we will switch back to Xarelto  Acute on chronic hypoxic hypercarbic respiratory failure with concern for aspiration Continue dysphagia 1 diet Continue aspiration precaution  independently reviewed chest x-ray which showed right middle lobe and left lower lobe infiltrates versus atelectasis Procalcitonin negative Continue Rocephin and IV Flagyl Patient is on BiPAP due to chronic CO2 retention Last ABG showing PCO2 79, pH of 7.310 and PO2 was 90 Continue BiPAP at night   COPD Continue bronchodilators Continue to maintain O2 saturation greater than 90%  Chronic A. fib On amiodarone for rhythm control On heparin drip for CVA prevention  Polyneuropathy Continue gabapentin  Hypotension: Blood pressures fairly stable.  Patient on midodrine that he will continue.  Chronic sacral pressure ulcer stage IV present on admission: Followed by wound care. Stable.   Obstructive sleep apnea: Patient is on BiPAP.  Uses BiPAP at night. Remain hypercarbic, repeat ABG shows inadequate ventilation.  We will try V 60 machine today.  Discussed with respiratory therapist.   Use also in the daytime with naps and consistently at night times.    Deconditioning/long-term nursing home resident: Work with PT OT.  Unsure whether he is rehab candidate.  Needs to go back to skilled nursing facility.  Patient will ultimately need ureteric stent by IR once drains are dry, he needs to stay on heparin bridging, will stay in the hospital.   DVT prophylaxis: Heparin infusion  Code Status: Partial Family Communication: patient's daughter at bedside. Disposition Plan: Back to nursing home when stable and needing no more surgeries.   Consultants:   Urology  Interventional radiology  Procedures:   Percutaneous drain, 11/5, 11/11 left kidney  Percutaneous drain, nephrostomy 04/02/2019  Antimicrobials:   Rocephin, 03/23/2019---  IV Flagyl 04/06/2019---     Objective: Vitals:   04/06/19 2044 04/07/19 0030 04/07/19 0520 04/07/19 0733  BP: 134/65 (!) 109/48 124/66 (!) 107/45  Pulse: 62 60 (!) 58 61  Resp: 20 20 18 16   Temp: 99 F (37.2 C) 98.2 F (36.8 C) 97.9 F (36.6 C) 97.9 F (36.6 C)  TempSrc: Oral Oral Oral Oral  SpO2: 97% 97% 100% 100%  Weight:   101.2 kg   Height:        Intake/Output Summary (Last 24 hours) at 04/07/2019 0830 Last data filed at 04/07/2019 0600 Gross per 24 hour  Intake 948.38 ml  Output 1420 ml  Net -471.62 ml   Filed Weights   04/05/19 0416 04/06/19 0545 04/07/19 0520  Weight: 100.2 kg 100.2 kg 101.2 kg    Exam:  . General: 85 y.o. year-old male well developed well nourished in no acute distress.  Alert and interactive. . Cardiovascular: Irregular rate and rhythm with no rubs or gallops.  No thyromegaly or JVD noted.   [name] Respiratory: Mild rales at bases no wheezing noted.  Poor inspiratory effort. . Abdomen: Soft obese tender diffusely with palpation.  Bowel sounds noted.  . Musculoskeletal: 2+ pitting edema in lower extremities bilaterally.  [name] Psychiatry: Mood is appropriate for condition and setting   Data Reviewed: CBC: Recent Labs  Lab 04/03/19 0351 04/04/19 0530 04/05/19 0437 04/06/19 0420 04/07/19 0353  WBC 7.1 6.7 6.5 6.6 4.8  HGB 8.7* 8.4* 8.7* 9.0* 8.5*  HCT 28.6* 27.9* 29.8* 29.9* 28.4*  MCV 102.5* 102.6* 104.9* 105.7* 105.6*  PLT 317 290 306 313 281   Basic Metabolic Panel: Recent Labs  Lab 04/07/19 0353  NA 143  K 4.0  CL 100  CO2  37*  GLUCOSE 106*  BUN 19  CREATININE 0.51*  CALCIUM 9.2   GFR: Estimated Creatinine Clearance: 74.6 mL/min (A) (by C-G formula based on SCr of 0.51 mg/dL (L)). Liver Function Tests: No results for input(s): AST, ALT, ALKPHOS, BILITOT, PROT, ALBUMIN in the last 168 hours. No results for input(s): LIPASE, AMYLASE in the last 168 hours. No results for input(s): AMMONIA in the last 168 hours. Coagulation Profile: No results for input(s): INR, PROTIME in the last 168 hours. Cardiac Enzymes: No results for input(s): CKTOTAL, CKMB, CKMBINDEX, TROPONINI in the last 168 hours. BNP (last 3 results) No results for input(s): PROBNP in the last 8760 hours. HbA1C: No results for input(s): HGBA1C in the last 72 hours. CBG: No results for input(s): GLUCAP in the last 168 hours. Lipid Profile: No results for input(s): CHOL, HDL, LDLCALC, TRIG, CHOLHDL, LDLDIRECT in the last 72 hours. Thyroid Function Tests: No results for input(s): TSH, T4TOTAL, FREET4, T3FREE, THYROIDAB in the last 72 hours. Anemia Panel: No results for input(s): VITAMINB12, FOLATE, FERRITIN, TIBC, IRON, RETICCTPCT in the last 72 hours. Urine analysis:    Component Value Date/Time   COLORURINE AMBER (A) 03/29/2019 1117   APPEARANCEUR CLOUDY (A) 03/29/2019 1117   LABSPEC 1.017 03/29/2019 1117   PHURINE 8.0 03/29/2019 1117   GLUCOSEU NEGATIVE 03/29/2019 1117   HGBUR LARGE (A) 03/29/2019 1117   BILIRUBINUR NEGATIVE 03/29/2019 1117   KETONESUR NEGATIVE 03/29/2019 1117  PROTEINUR 100 (A) 03/29/2019 1117   UROBILINOGEN 0.2 08/06/2013 1006   NITRITE NEGATIVE 03/29/2019 1117   LEUKOCYTESUR SMALL (A) 03/29/2019 1117   Sepsis Labs: @LABRCNTIP (procalcitonin:4,lacticidven:4)  ) Recent Results (from the past 240 hour(s))  Aerobic/Anaerobic Culture (surgical/deep wound)     Status: None   Collection Time: 03/28/19  4:19 PM   Specimen: Abscess  Result Value Ref Range Status   Specimen Description ABSCESS PERINEPHRIC DRAIN   Final   Special Requests Normal  Final   Gram Stain   Final    RARE WBC PRESENT,BOTH PMN AND MONONUCLEAR NO ORGANISMS SEEN    Culture   Final    RARE PROTEUS MIRABILIS NO ANAEROBES ISOLATED Performed at [facility], [address] [place], [place] [address]    Report Status 04/02/2019 FINAL  Final   Organism ID, Bacteria PROTEUS MIRABILIS  Final      Susceptibility   Proteus mirabilis - MIC*    AMPICILLIN >=32 RESISTANT Resistant     CEFAZOLIN 16 SENSITIVE Sensitive     CEFEPIME <=1 SENSITIVE Sensitive     CEFTAZIDIME <=1 SENSITIVE Sensitive     CEFTRIAXONE <=1 SENSITIVE Sensitive     CIPROFLOXACIN >=4 RESISTANT Resistant     GENTAMICIN <=1 SENSITIVE Sensitive     IMIPENEM 2 SENSITIVE Sensitive     TRIMETH/SULFA >=320 RESISTANT Resistant     AMPICILLIN/SULBACTAM >=32 RESISTANT Resistant     PIP/TAZO <=4 SENSITIVE Sensitive     * RARE PROTEUS MIRABILIS  Culture, Urine     Status: None   Collection Time: 03/30/19  6:54 AM   Specimen: Urine, Random  Result Value Ref Range Status   Specimen Description URINE, RANDOM BAG,BED  Final   Special Requests NONE  Final   Culture   Final    NO GROWTH Performed at [facility], [address] [place], [place] [address]    Report Status 03/31/2019 FINAL  Final  SARS CORONAVIRUS 2 (TAT 6-24 HRS) Nasopharyngeal Nasopharyngeal Swab     Status: None   Collection Time: 04/01/19 11:55 AM   Specimen: Nasopharyngeal Swab  Result Value Ref Range Status   SARS Coronavirus 2 NEGATIVE NEGATIVE Final    Comment: (NOTE) SARS-CoV-2 target nucleic acids are NOT DETECTED. The SARS-CoV-2 RNA is generally detectable in upper and lower respiratory specimens during the acute phase of infection. Negative results do not preclude SARS-CoV-2 infection, do not rule out co-infections with other pathogens, and should not be used as the sole basis for treatment or other patient management decisions. Negative results must be combined with  clinical observations, patient history, and epidemiological information. The expected result is Negative. Fact Sheet for Patients: HairSlick.nohttps://www.fda.gov/media/138098/download Fact Sheet for Healthcare Providers: quierodirigir.comhttps://www.fda.gov/media/138095/download This test is not yet approved or cleared by the [place] FDA and  has been authorized for detection and/or diagnosis of SARS-CoV-2 by FDA under an Emergency Use Authorization (EUA). This EUA will remain  in effect (meaning this test can be used) for the duration of the COVID-19 declaration under Section 56 4(b)(1) of the Act, 21 U.S.C. section 360bbb-3(b)(1), unless the authorization is terminated or revoked sooner. Performed at [facility], [address] [place], [place] [address]       Studies: Dg Chest Port 1 View  Result Date: 04/06/2019 CLINICAL DATA:  Altered mental status, possible aspiration. EXAM: PORTABLE CHEST 1 VIEW COMPARISON:  05/27/2017. FINDINGS: Trachea is midline. Heart is enlarged, stable. Thoracic aorta is calcified. Left mid and lower lung  zone airspace consolidation with a left pleural effusion. Biapical pleural thickening. Mild interstitial prominence may be chronic. IMPRESSION: 1. Left mid and lower lung zone airspace consolidation is worrisome for pneumonia. 2. Small left pleural effusion. 3.  Aortic atherosclerosis (ICD10-170.0). Electronically Signed   By: Lorin Picket M.D.   On: 04/06/2019 14:20    Scheduled Meds: . amiodarone  200 mg Oral Daily  . famotidine  20 mg Oral BID  . feeding supplement (PRO-STAT SUGAR FREE 64)  30 mL Oral BID WC  . gabapentin  400 mg Oral TID  . midodrine  10 mg Oral TID WC  . multivitamin with minerals  1 tablet Oral Daily  . nutrition supplement (JUVEN)  1 packet Oral BID BM  . potassium chloride  20 mEq Oral Daily  . Ensure Max Protein  11 oz Oral QHS  . sodium chloride flush  3 mL Intravenous Q12H  . sodium chloride flush  5 mL Intracatheter Q8H  .  umeclidinium bromide  1 puff Inhalation Daily    Continuous Infusions: . sodium chloride 250 mL (04/06/19 1711)  . cefTRIAXone (ROCEPHIN)  IV 2 g (04/06/19 1715)  . heparin 2,050 Units/hr (04/07/19 0548)  . metronidazole 500 mg (04/07/19 4782)     LOS: 16 days     Kayleen Memos, MD [facility] Pager [phone]  If 7PM-7AM, please contact night-coverage [email or web address] Password [email or web address] 04/07/2019, 8:30 AM

## 2019-04-07 NOTE — Progress Notes (Signed)
ANTICOAGULATION CONSULT NOTE  Pharmacy Consult:  Heparin Indication: atrial fibrillation  Patient Measurements: Height: 5\' 10"  (177.8 cm) Weight: 223 lb 1.7 oz (101.2 kg) IBW/kg (Calculated) : 73 Heparin Dosing Weight: 92.3kg   Vital Signs: Temp: 97.9 F (36.6 C) (11/21 0733) Temp Source: Oral (11/21 0733) BP: 107/45 (11/21 0733) Pulse Rate: 61 (11/21 0733)  Labs: Recent Labs    04/05/19 0437 04/06/19 0420 04/07/19 0353  HGB 8.7* 9.0* 8.5*  HCT 29.8* 29.9* 28.4*  PLT 306 313 281  HEPARINUNFRC 0.51 0.44 0.57  CREATININE  --   --  0.51*    Estimated Creatinine Clearance: 74.6 mL/min (A) (by C-G formula based on SCr of 0.51 mg/dL (L)).   Assessment: 26 yoM admitted on 2019/04/12 with perinephric fluid collection requiring nephrostomy tube. Pt on Xarelto PTA for hx AFib (CHADSVASc = 3) which has been held with need for procedures and bridged with IV heparin.   Heparin level is therapeutic and stable.  Hematuria improving per MD note, H/H stable.  Goal of Therapy:  Heparin level 0.3-0.7 units/ml Monitor platelets by anticoagulation protocol: Yes   Plan:  Continue heparin at 2050 units/hr Daily heparin level and CBC  Benetta Spar, PharmD, BCPS, [facility] Clinical Pharmacist  Please check AMION for all [place] phone numbers After 10:00 PM, call [place] [phone]

## 2019-04-07 NOTE — Progress Notes (Signed)
Patient was on BIpap  Till 10 pm when he woke up. Patient wants to drink ensure and so switched to [place] sating 98%.  Midnight,  Pt  Refused to put back on BIPAP, he insisted not to, and says" I hate that mask tell that to the DR" Explained to patient the need however patient got mad. Kept on [place] sating 98% on 4L. Will monitor.

## 2019-04-08 DIAGNOSIS — Z7189 Other specified counseling: Secondary | ICD-10-CM

## 2019-04-08 DIAGNOSIS — Z515 Encounter for palliative care: Secondary | ICD-10-CM

## 2019-04-08 DIAGNOSIS — N151 Renal and perinephric abscess: Principal | ICD-10-CM

## 2019-04-08 LAB — HEPARIN LEVEL (UNFRACTIONATED): Heparin Unfractionated: 0.63 IU/mL (ref 0.30–0.70)

## 2019-04-08 LAB — CBC
HCT: 28.5 % — ABNORMAL LOW (ref 39.0–52.0)
Hemoglobin: 8.4 g/dL — ABNORMAL LOW (ref 13.0–17.0)
MCH: 31.1 pg (ref 26.0–34.0)
MCHC: 29.5 g/dL — ABNORMAL LOW (ref 30.0–36.0)
MCV: 105.6 fL — ABNORMAL HIGH (ref 80.0–100.0)
Platelets: 267 10*3/uL (ref 150–400)
RBC: 2.7 MIL/uL — ABNORMAL LOW (ref 4.22–5.81)
RDW: 15.2 % (ref 11.5–15.5)
WBC: 5.6 10*3/uL (ref 4.0–10.5)
nRBC: 0 % (ref 0.0–0.2)

## 2019-04-08 MED ORDER — IPRATROPIUM-ALBUTEROL 0.5-2.5 (3) MG/3ML IN SOLN: 3.0000 mL | Freq: Four times a day (QID) | RESPIRATORY_TRACT | Status: DC

## 2019-04-08 MED ORDER — ALBUTEROL SULFATE (2.5 MG/3ML) 0.083% IN NEBU
2.5000 mg | INHALATION_SOLUTION | Freq: Four times a day (QID) | RESPIRATORY_TRACT | Status: DC
  Administered 2019-04-08 – 2019-04-09 (×5): 2.5 mg via RESPIRATORY_TRACT
  Filled 2019-04-08 (×5): qty 3

## 2019-04-08 NOTE — Progress Notes (Signed)
Patient states that he does not want to use "mask" or NIV for the night. Patient states "my daughter ask me to wear it and that is why I wore is lastnight".  RN is aware and notified of patient refusal, will use NIV if patient is in active respiratory distress. No distress or complications noted at this time.

## 2019-04-08 NOTE — Consult Note (Signed)
Consultation Note Date: 04/08/2019   Patient Name: [name]  DOB: [birth year]  MRN: [id number]  Age / Sex: 85 y.o., male  PCP: [facility], [place] Not In Referring Physician: Kayleen Memos, DO  Reason for Consultation: Establishing goals of care  HPI/Patient Profile: 85 y.o. male  with past medical history of A. Fib, CHF (EF 40%), sacral wound infection admitted on 03/18/2019 with abdominal pain. Found to have urinoma and abscess d/t renal calculi. Has undergone several procedures through IR for nephrostomy tubes and drain placements. Admission and recovery has been complicated by drains and tubes dislodging and needing to be replaced, as well has likely aspiration pneumonia. He has OSA and has had ongoing hypercapnia requiring Bipap at night which he does not enjoy. 11/20 he was very lethargic and not eating- chest xray showed aspiration pneumonia. He however, has "bounced back" per him and his daughter- eating breakfast and lunch today, alert and oriented. Palliative medicine consulted for [place].      Clinical Assessment and Goals of Care:  I have reviewed medical records including EPIC notes, labs and imaging, assessed the patient and then met at the bedside along with his daughterAngela Nevin-  to discuss diagnosis prognosis, GOC, EOL wishes, disposition and options..  Patient was awake, alert and oriented. Able to recall his hospitalizations and medical problems.   I introduced Palliative Medicine as specialized medical care for people living with serious illness. It focuses on providing relief from the symptoms and stress of a serious illness. The goal is to improve quality of life for both the patient and the family.  As far as functional and nutritional status- he is wheelchair bound at baseline. Dependent for ADL's living in [place] at [facility] which he enjoys. He feels nutritionally he is doing well, no recent  weight loss- although with current deconditioning he is requiring pureed dysphagia diet.    We discussed his current illness and what it means in the larger context of his on-going co-morbidities.  Natural disease trajectory and expectations at EOL were discussed. Mr. Kinne tells me, "I'm no spring chicken, but I'm not expiring yet!"   I attempted to elicit values and goals of care important to the patient. Being awake and able to interact with family are primary [place]. He is content at current functional status- although he would like to work with PT as he feels he has gotten weaker during this hospitalization.     The difference between aggressive medical intervention and comfort care was considered in light of the patient's goals of care.   Advanced directives, concepts specific to code status, artifical feeding and hydration, and rehospitalization were considered and discussed. Mr. Kakos would not want long term artificial means of life support and this includes feeding tube. Code status- we discussed the limited ability of resuscitation to bring him back to a state where is better than before going through a code. We discussed his current code status that indicates no intubation, but would want chest compressions and other measures and how  if he required full code he would need intubation to survive. We discussed with his current comorbidities his likelihood of surviving cardiac arrest would be very low. He responds that he believes CPR should bring someone back to normal and tells of various stories he has read about people surviving these events and coming back to live much longer. We discussed how he may be in a different place than others- he is going to think about code status. He desires full scope care otherwise with the hope that he can return to [facility].  Questions and concerns were addressed.  Hard Choices booklet left for review. The family was encouraged to call with questions or concerns.    Primary Decision Maker PATIENT- he also has HCPOA given to his daughter- Bronson Ing and his son [name] Dollins    SUMMARY OF RECOMMENDATIONS -Continue current care, hopeful to d/c to [facility] at SNF -PMT will revisit [facility] while admitted -PT ordered- patient stated he would like to work to get out of bed    Code Status/Advance Care Planning:  Limited code- no intubation  Palliative Prophylaxis:   Aspiration  Prognosis:    Unable to determine  Discharge Planning: SNF with Palliative  Primary Diagnoses: Present on Admission: . Perinephric fluid collection . Paroxysmal Atrial fibrillation - admitted with RVR . Chronic diastolic CHF (congestive heart failure) ([place]) . Hypokalemia . Asthma . Ileus ([place]) . Closed compression fracture of L4 vertebra (HCC) . Sacral pressure ulcer   I have reviewed the medical record, interviewed the patient and family, and examined the patient. The following aspects are pertinent.  Past Medical History:  Diagnosis Date  . A-fib ([place])   . Acute systolic CHF (congestive heart failure), NYHA class 3 -- Unclear etilogy (? Afib related) EF down from 60-70% to 40%. 07/05/2012   Echo 2/21: LV upper limits of normal. EF- 40%. Cannot assess Diastolic function - Aortic Sclerosis, Mod-Severe Left Atrial dilation; Mild RV & RA dilation; Moderately elevated PA peak pressure: 96m Hg     . Arthritis    knees  . Asthma   . ASTHMA 02/06/2007   Qualifier: Diagnosis of  By: CRogue BussingCMA, JMaryann Alar   . BENIGN PROSTATIC HYPERTROPHY 05/13/2010   Qualifier: Diagnosis of  By: KBurnice Logan MD, PDoretha Sou  . Bifascicular block 12/18/2013  . BPH (benign prostatic hypertrophy)   . Bronchospasm, exercise-induced   . CHF (congestive heart failure) (HCC)    systolic, EF 420%(29/47/0962  . Chronic kidney disease    stabilized, due to infection  . Chronic rhinitis 06/02/2009   Qualifier: Diagnosis of  By: JArnoldo MoraleMD, JBalinda Quails  . DJD  (degenerative joint disease), lumbar 01/18/2012  . Dyslipidemia   . Edema    Bilateral - left lower leg greater than right- wears compression stockings  . General weakness 07/05/2012  . GERD (gastroesophageal reflux disease)   . History of elevated glucose 10/01/2009   Qualifier: Diagnosis of  By: JArnoldo MoraleMD, JBalinda Quails  . Hx of colonic polyps 11/02/2011  . Hypertension   . INSOMNIA, CHRONIC, MILD 01/02/2008   Qualifier: Diagnosis of  By: JArnoldo MoraleMD, JBalinda Quails  . Macular degeneration 08-06-13   bilateral -sight impaired  . Obesity   . Paroxysmal Atrial fibrillation - admitted with RVR 01/18/2012  . S/P left THA, AA 08/13/2013  . Shortness of breath    Social History   Socioeconomic History  . Marital status: Widowed    Spouse  name: Not on file  . Number of children: 2  . Years of education: Not on file  . Highest education level: Not on file  Occupational History  . Occupation: retired    Fish farm manager: RETIRED  Social Needs  . Financial resource strain: Not on file  . Food insecurity    Worry: Not on file    Inability: Not on file  . Transportation needs    Medical: Not on file    Non-medical: Not on file  Tobacco Use  . Smoking status: Former Research scientist (life sciences)  . Smokeless tobacco: Never Used  . Tobacco comment: quit in 1956  Substance and Sexual Activity  . Alcohol use: No  . Drug use: No  . Sexual activity: Not Currently  Lifestyle  . Physical activity    Days per week: Not on file    Minutes per session: Not on file  . Stress: Not on file  Relationships  . Social Herbalist on phone: Not on file    Gets together: Not on file    Attends religious service: Not on file    Active member of club or organization: Not on file    Attends meetings of clubs or organizations: Not on file    Relationship status: Not on file  Other Topics Concern  . Not on file  Social History Narrative   Admitted to [facility] 01/28/17   Widowed   Former smoker-stopped 1966    Alcohol  None   Full Code   Family History  Problem Relation Age of Onset  . Alzheimer's disease Mother   . Heart disease Father   . Colon cancer Neg Hx   . Esophageal cancer Neg Hx    Scheduled Meds: . albuterol  2.5 mg Nebulization Q6H  . amiodarone  200 mg Oral Daily  . famotidine  20 mg Oral BID  . feeding supplement (PRO-STAT SUGAR FREE 64)  30 mL Oral BID WC  . gabapentin  400 mg Oral TID  . midodrine  10 mg Oral TID WC  . multivitamin with minerals  1 tablet Oral Daily  . nutrition supplement (JUVEN)  1 packet Oral BID BM  . potassium chloride  20 mEq Oral Daily  . Ensure Max Protein  11 oz Oral QHS  . sodium chloride flush  3 mL Intravenous Q12H  . sodium chloride flush  5 mL Intracatheter Q8H  . umeclidinium bromide  1 puff Inhalation Daily   Continuous Infusions: . sodium chloride 10 mL/hr at 04/08/19 1100  . cefTRIAXone (ROCEPHIN)  IV Stopped (04/07/19 1817)  . heparin 2,000 Units/hr (04/08/19 1100)  . metronidazole 500 mg (04/08/19 1415)   PRN Meds:.sodium chloride, acetaminophen **OR** acetaminophen, albuterol, antiseptic oral rinse, fentaNYL (SUBLIMAZE) injection, Melatonin, ondansetron (ZOFRAN) IV Medications Prior to Admission:  Prior to Admission medications   Medication Sig Start Date End Date Taking? Authorizing Provider  acetaminophen (TYLENOL) 500 MG tablet Take 2 tablets (1,000 mg total) by mouth every 8 (eight) hours. Patient taking differently: Take 1,000 mg by mouth See admin instructions. Take 1,000 mg by mouth every eight hours and do not exceed 4,000 mg/24 hours from all combined sources 01/24/17  Yes Babish, Rodman Key, [name]  albuterol (PROVENTIL HFA;VENTOLIN HFA) 108 (90 Base) MCG/ACT inhaler Inhale 2 puffs into the lungs every 4 (four) hours as needed for wheezing or shortness of breath.    Yes [provider]  Amino Acids-Protein Hydrolys (FEEDING SUPPLEMENT, PRO-STAT SUGAR FREE 64,) LIQD  Take 30 mLs by mouth 3 (three) times daily.    Yes  [provider]  amiodarone (PACERONE) 200 MG tablet TAKE ONE TABLET BY MOUTH ONCE DAILY Patient taking differently: Take 200 mg by mouth daily.  10/29/16  Yes Hilty, Nadean Corwin, MD  Artificial Saliva (BIOTENE DRY MOUTH MOISTURIZING) SOLN 1 spray by Mouth Rinse route every 2 (two) hours as needed (for dryness).   Yes [provider]  bumetanide (BUMEX) 1 MG tablet Take 1 tablet (1 mg total) by mouth 2 (two) times daily. 10/15/16  Yes Hilty, Nadean Corwin, MD  cetirizine (ZYRTEC) 10 MG tablet Take 10 mg by mouth daily.    Yes [provider]  Cholecalciferol (VITAMIN D-3) 25 MCG (1000 UT) CAPS Take 1,000 Units by mouth daily.   Yes [provider]  docusate sodium 100 MG CAPS Take 100 mg by mouth 2 (two) times daily. 08/15/13  Yes Babish, Rodman Key, [name]  famotidine (PEPCID) 20 MG tablet Take 1 tablet (20 mg total) by mouth 2 (two) times daily. 02/21/17  Yes Patrecia Pour, MD  ferrous sulfate (FERROUSUL) 325 (65 FE) MG tablet Take 1 tablet (325 mg total) by mouth 3 (three) times daily with meals. Patient taking differently: Take 325 mg by mouth 2 (two) times daily with a meal.  01/24/17  Yes Babish, Rodman Key, [name]  gabapentin (NEURONTIN) 400 MG capsule TAKE 1 CAPSULE BY MOUTH THREE TIMES DAILY Patient taking differently: Take 400 mg by mouth 3 (three) times daily.  01/14/17  Yes Marletta Lor, MD  ipratropium-albuterol (DUONEB) 0.5-2.5 (3) MG/3ML SOLN Take 3 mLs by nebulization every 4 (four) hours as needed (wheezing).  03/14/19  Yes [provider]  KLOR-CON M20 20 MEQ tablet Take 20 mEq by mouth daily. 11/11/18  Yes [provider]  midodrine (PROAMATINE) 10 MG tablet Take 1 tablet (10 mg total) by mouth 3 (three) times daily. 05/28/17  Yes Patrecia Pour, MD  Multiple Vitamins-Minerals (CERTAGEN PO) Take 1 tablet by mouth daily.    Yes [provider]  Multiple Vitamins-Minerals (PRESERVISION AREDS 2) CAPS Take 1 capsule by mouth 2 (two) times  daily.   Yes [provider]  nutrition supplement, JUVEN, (JUVEN) PACK Take 1 packet by mouth 2 (two) times daily between meals. 02/21/17  Yes Patrecia Pour, MD  Nutritional Supplements (RESOURCE 2.0) LIQD Take 120 mLs by mouth at bedtime.   Yes [provider]  OXYGEN Inhale 2 L/min into the lungs as needed (for shortness of breath).    Yes [provider]  polyethylene glycol (MIRALAX / GLYCOLAX) packet Take 17 g by mouth 2 (two) times daily. Patient taking differently: Take 17 g by mouth See admin instructions. Mix 17 grams into 4-8 ounces of liquid and drink two times a day 01/24/17  Yes Babish, Rodman Key, [name]  PRESCRIPTION MEDICATION BiPAP: At bedtime   Yes [provider]  rivaroxaban (XARELTO) 20 MG TABS tablet Take 1 tablet (20 mg total) by mouth daily. 08/05/15  Yes Hilty, Nadean Corwin, MD  rosuvastatin (CRESTOR) 20 MG tablet TAKE ONE TABLET BY MOUTH ONCE A WEEK, FRIDAY Patient taking differently: Take 20 mg by mouth every Friday.  12/24/16  Yes Hilty, Nadean Corwin, MD  simethicone (MYLICON) 947 MG chewable tablet Chew 125 mg by mouth 3 (three) times daily. FOR 5 DAYS 03/29/2019 03/26/19 Yes [provider]  SPIRIVA HANDIHALER 18 MCG inhalation capsule INHALE ONE PUFF BY MOUTH ONCE DAILY Patient taking differently: Place 18 mcg into  inhaler and inhale daily.  03/18/16  Yes Marletta Lor, MD  tamsulosin (FLOMAX) 0.4 MG CAPS capsule TAKE 1 CAPSULE BY MOUTH ONCE DAILY Patient taking differently: Take 0.4 mg by mouth daily.  12/24/16  Yes Marletta Lor, MD  traMADol (ULTRAM) 50 MG tablet Take 1-2 tablets (50-100 mg total) by mouth every 12 (twelve) hours as needed for moderate pain or severe pain (and before hydrotherapy). Patient taking differently: Take 50 mg by mouth at bedtime.  05/28/17  Yes Patrecia Pour, MD  protein supplement shake (PREMIER PROTEIN) LIQD Take 325 mLs (11 oz total) by mouth daily. Patient not taking: Reported on 04/09/2019  02/21/17   Patrecia Pour, MD   Allergies  Allergen Reactions  . Nsaids Other (See Comments)    Stomach pain  . Morphine And Related Nausea And Vomiting and Other (See Comments)    Dizziness, light-headedness and "Opiates cause tightness in chest"  . Vicodin [Hydrocodone-Acetaminophen] Anxiety   Review of Systems  Constitutional: Negative for appetite change and fatigue.  Respiratory: Negative for shortness of breath.   Gastrointestinal: Positive for abdominal pain and constipation.    Physical Exam Vitals signs reviewed.  Constitutional:      Appearance: Normal appearance.  HENT:     Head: Normocephalic and atraumatic.  Cardiovascular:     Rate and Rhythm: Normal rate.  Pulmonary:     Effort: Pulmonary effort is normal.  Neurological:     General: No focal deficit present.     Mental Status: He is alert and oriented to person, place, and time.  Psychiatric:        Mood and Affect: Mood normal.        Behavior: Behavior normal.        Thought Content: Thought content normal.     Vital Signs: BP (!) 120/47 (BP Location: Right Arm)   Pulse 64   Temp 98 F (36.7 C) (Oral)   Resp 18   Ht '5\' 10"'$  (1.778 m)   Wt 101.6 kg   SpO2 96%   BMI 32.14 kg/m  Pain Scale: 0-10   Pain Score: 0-No pain   SpO2: SpO2: 96 % O2 Device:SpO2: 96 % O2 Flow Rate: .O2 Flow Rate (L/min): 3 L/min  IO: Intake/output summary:   Intake/Output Summary (Last 24 hours) at 04/08/2019 1436 Last data filed at 04/08/2019 1415 Gross per 24 hour  Intake 2383.42 ml  Output 1390 ml  Net 993.42 ml    LBM: Last BM Date: 04/07/19 Baseline Weight: Weight: 94.8 kg Most recent weight: Weight: 101.6 kg     Palliative Assessment/Data: PPS: 30%     Thank you for this consult. Palliative medicine will continue to follow and assist as needed.   Time In: 1300 Time Out: 1430 Time Total: 90 mins Prolonged services: yes Greater than 50%  of this time was spent counseling and coordinating care  related to the above assessment and plan.  Signed by: Mariana Kaufman, AGNP-C Palliative Medicine    Please contact Palliative Medicine Team phone at [phone] for questions and concerns.  For individual provider: See Shea Evans

## 2019-04-08 NOTE — Progress Notes (Signed)
Called his daughter and gave update via phone. All questions answered to her satisfaction.

## 2019-04-08 NOTE — Progress Notes (Signed)
PROGRESS NOTE  [name] JHE:[id number] DOB: [birth year] DOA: 2019-04-08 PCP: System, Pcp Not In  HPI/Recap of past 24 hours: 85 y.o.malewith medical history significant foratrial fibrillation on Xarelto, chronic diastolic CHF, asthma/COPD, OSA on BiPAP, hypotension on midodrine, and chronic sacral ulcer, who was brought to the emergency department for evaluation of progressive abdominal distention with alternating constipation and watery stool. Found to have perinephric fluid collection requiring nephrostomy tube. Patient is from long-term nursing home.  He used to walk with a walker and not done so for at least 6 months now.  Mostly wheelchair-bound. Underwent multiple procedures , percutaneous nephrostomy tube on 11/5, 11/11, 04/02/2019 with radiology. Remains in the hospital to complete treatment.  11/21: Patient was seen and examined at his bedside this morning.  He denies dyspnea or chest pain at rest.  Denies dysuria nausea.  Admits to diffuse abdominal pain when palpated.  C[employer] colored urine noted.    04/08/19: Patient was seen and examined at his bedside this morning.  He is on nasal cannula.  Wheezing noted on exam.  Started nebs treatment every 6 hours.  We will continue bronchodilators.  Patient encouraged to use his BiPAP at night due to CO2 retention.  Assessment/Plan: Principal Problem:   Perinephric fluid collection Active Problems:   Asthma   Paroxysmal Atrial fibrillation - admitted with RVR   Chronic diastolic CHF (congestive heart failure) (HCC)   AF (paroxysmal atrial fibrillation) (HCC)   Hypokalemia   Ileus (HCC)   Closed compression fracture of L4 vertebra (HCC)   Sacral pressure ulcer   Renal cyst, left  Abdominal pain secondary to perinephric fluid collection/left-sided hydronephrosis with urinoma/complicatedby  Proteus mirabilis UTI post percutaneous nephrostomy tube placement on 03/22/2019 and 04/03/2019. 03/22/2019, superior pole nephrostomy tube  placed. 03/28/2019, inferior pole nephrostomy tube placed due to collection not drained. 04/02/2019, inadequate drainage and persistent collection, new nephrostomy tubes placed x 3.  Initial urine culture with Proteus.  Blood cultures negative.   Currently on IV Flagyl and Rocephin.   Interventional radiology following. Continue pain management and continue to closely monitor drain output  Gross hematuria likely secondary to coagulopathy Currently on heparin drip for CVA prevention due to chronic A. Fib Continue heparin drip until no further procedures planned When no planned procedures we will switch back to Xarelto Hemoglobin is stable 8.4 on 04/08/2019 from 8.5 on 04/07/2019.    Acute on chronic hypoxic hypercarbic respiratory failure with concern for aspiration Continue dysphagia 1 diet Continue aspiration precaution  independently reviewed chest x-ray which showed right middle lobe and left lower lobe infiltrates versus atelectasis Procalcitonin negative less than 0.10 04/07/2019. Continue Rocephin and IV Flagyl Continue BiPAP due to chronic CO2 retention Last ABG showing PCO2 79, pH of 7.310 and PO2 was 90 Continue BiPAP at night   Chronic CO2 retention secondary to OSA and suspected obesity hypoventilation syndrome. Continue BiPAP at night  COPD Continue bronchodilators Continue to maintain O2 saturation greater than 90%  Chronic A. Fib Currently rate controlled On amiodarone for rhythm control On heparin drip for CVA prevention  Polyneuropathy Continue gabapentin  Hypotension: Blood pressures fairly stable.  Patient on midodrine that he will continue.  Chronic sacral pressure ulcer stage IV present on admission: Followed by wound care. Stable.  Management per wound care specialist recommendation.  Obstructive sleep apnea: Patient is on BiPAP.  Uses BiPAP at night. Remain hypercarbic  Physical debility/ambulatory dysfunction Continue PT PT recommended SNF CSW  assisting with placement.   Patient will  ultimately need ureteric stent by IR once drains are dry, he needs to stay on heparin drip, will stay in the hospital.  DVT prophylaxis: Heparin drip Code Status: Partial Family Communication:  Updated daughter via phone on 04/07/2019. Disposition Plan: Back to nursing home when stable and needing no more surgeries.   Consultants:   Urology  Interventional radiology  Procedures:   Percutaneous drain, 11/5, 11/11 left kidney  Percutaneous drain, nephrostomy 04/02/2019  Antimicrobials:   Rocephin, 18-Apr-2019---  IV Flagyl 04/06/2019---     Objective: Vitals:   04/08/19 0041 04/08/19 0429 04/08/19 0715 04/08/19 0943  BP:  (!) 120/47    Pulse:  64 64   Resp:  18 18   Temp:  98 F (36.7 C)    TempSrc:  Oral    SpO2:  93% 95% 96%  Weight: 101.6 kg     Height:        Intake/Output Summary (Last 24 hours) at 04/08/2019 1248 Last data filed at 04/08/2019 1100 Gross per 24 hour  Intake 2503.42 ml  Output 1640 ml  Net 863.42 ml   Filed Weights   04/06/19 0545 04/07/19 0520 04/08/19 0041  Weight: 100.2 kg 101.2 kg 101.6 kg    Exam:  . General: 85 y.o. year-old male well-developed well-nourished in no acute distress.  Alert and interactive. . Cardiovascular: Irregular rate and rhythm no rubs or gallops no JVD aortomegaly noted. [name] Respiratory: Audible wheezing noted bilaterally.  Poor inspiratory effort. . Abdomen: Obese bowel sounds present.  Drain tubes x3-left lateral abdomen.   . Musculoskeletal: 2+ pitting edema in lower extremities bilaterally. Psychiatry: Mood is appropriate for condition and setting.  Data Reviewed: CBC: Recent Labs  Lab 04/04/19 0530 04/05/19 0437 04/06/19 0420 04/07/19 0353 04/08/19 0355  WBC 6.7 6.5 6.6 4.8 5.6  HGB 8.4* 8.7* 9.0* 8.5* 8.4*  HCT 27.9* 29.8* 29.9* 28.4* 28.5*  MCV 102.6* 104.9* 105.7* 105.6* 105.6*  PLT 290 306 313 281 267   Basic Metabolic Panel: Recent Labs   Lab 04/07/19 0353  NA 143  K 4.0  CL 100  CO2 37*  GLUCOSE 106*  BUN 19  CREATININE 0.51*  CALCIUM 9.2   GFR: Estimated Creatinine Clearance: 74.7 mL/min (A) (by C-G formula based on SCr of 0.51 mg/dL (L)). Liver Function Tests: No results for input(s): AST, ALT, ALKPHOS, BILITOT, PROT, ALBUMIN in the last 168 hours. No results for input(s): LIPASE, AMYLASE in the last 168 hours. No results for input(s): AMMONIA in the last 168 hours. Coagulation Profile: No results for input(s): INR, PROTIME in the last 168 hours. Cardiac Enzymes: No results for input(s): CKTOTAL, CKMB, CKMBINDEX, TROPONINI in the last 168 hours. BNP (last 3 results) No results for input(s): PROBNP in the last 8760 hours. HbA1C: No results for input(s): HGBA1C in the last 72 hours. CBG: No results for input(s): GLUCAP in the last 168 hours. Lipid Profile: No results for input(s): CHOL, HDL, LDLCALC, TRIG, CHOLHDL, LDLDIRECT in the last 72 hours. Thyroid Function Tests: No results for input(s): TSH, T4TOTAL, FREET4, T3FREE, THYROIDAB in the last 72 hours. Anemia Panel: No results for input(s): VITAMINB12, FOLATE, FERRITIN, TIBC, IRON, RETICCTPCT in the last 72 hours. Urine analysis:    Component Value Date/Time   COLORURINE AMBER (A) 03/29/2019 1117   APPEARANCEUR CLOUDY (A) 03/29/2019 1117   LABSPEC 1.017 03/29/2019 1117   PHURINE 8.0 03/29/2019 1117   GLUCOSEU NEGATIVE 03/29/2019 1117   HGBUR LARGE (A) 03/29/2019 1117   BILIRUBINUR NEGATIVE 03/29/2019 1117  KETONESUR NEGATIVE 03/29/2019 1117   PROTEINUR 100 (A) 03/29/2019 1117   UROBILINOGEN 0.2 08/06/2013 1006   NITRITE NEGATIVE 03/29/2019 1117   LEUKOCYTESUR SMALL (A) 03/29/2019 1117   Sepsis Labs: @LABRCNTIP (procalcitonin:4,lacticidven:4)  ) Recent Results (from the past 240 hour(s))  Culture, Urine     Status: None   Collection Time: 03/30/19  6:54 AM   Specimen: Urine, Random  Result Value Ref Range Status   Specimen Description  URINE, RANDOM BAG,BED  Final   Special Requests NONE  Final   Culture   Final    NO GROWTH Performed at [facility], [address], [place], [place] [address]    Report Status 03/31/2019 FINAL  Final  SARS CORONAVIRUS 2 (TAT 6-24 HRS) Nasopharyngeal Nasopharyngeal Swab     Status: None   Collection Time: 04/01/19 11:55 AM   Specimen: Nasopharyngeal Swab  Result Value Ref Range Status   SARS Coronavirus 2 NEGATIVE NEGATIVE Final    Comment: (NOTE) SARS-CoV-2 target nucleic acids are NOT DETECTED. The SARS-CoV-2 RNA is generally detectable in upper and lower respiratory specimens during the acute phase of infection. Negative results do not preclude SARS-CoV-2 infection, do not rule out co-infections with other pathogens, and should not be used as the sole basis for treatment or other patient management decisions. Negative results must be combined with clinical observations, patient history, and epidemiological information. The expected result is Negative. Fact Sheet for Patients: HairSlick.no Fact Sheet for Healthcare Providers: quierodirigir.com This test is not yet approved or cleared by the [place] FDA and  has been authorized for detection and/or diagnosis of SARS-CoV-2 by FDA under an Emergency Use Authorization (EUA). This EUA will remain  in effect (meaning this test can be used) for the duration of the COVID-19 declaration under Section 56 4(b)(1) of the Act, 21 U.S.C. section 360bbb-3(b)(1), unless the authorization is terminated or revoked sooner. Performed at [facility], [address] [place], [place] [address]       Studies: No results found.  Scheduled Meds: . albuterol  2.5 mg Nebulization Q6H  . amiodarone  200 mg Oral Daily  . famotidine  20 mg Oral BID  . feeding supplement (PRO-STAT SUGAR FREE 64)  30 mL Oral BID WC  . gabapentin  400 mg Oral TID  . midodrine  10 mg Oral TID  WC  . multivitamin with minerals  1 tablet Oral Daily  . nutrition supplement (JUVEN)  1 packet Oral BID BM  . potassium chloride  20 mEq Oral Daily  . Ensure Max Protein  11 oz Oral QHS  . sodium chloride flush  3 mL Intravenous Q12H  . sodium chloride flush  5 mL Intracatheter Q8H  . umeclidinium bromide  1 puff Inhalation Daily    Continuous Infusions: . sodium chloride 10 mL/hr at 04/08/19 1100  . cefTRIAXone (ROCEPHIN)  IV Stopped (04/07/19 1817)  . heparin 2,000 Units/hr (04/08/19 1100)  . metronidazole Stopped (04/08/19 0757)     LOS: 17 days     Darlin Drop, MD [facility] Pager [phone]  If 7PM-7AM, please contact night-coverage [email or web address] Password [facility] 04/08/2019, 12:48 PM

## 2019-04-08 NOTE — Progress Notes (Signed)
Patient attempted to wear NIV for the night. Patient states its uncomfortably. Patient was placed on [place] per RN staff.

## 2019-04-08 NOTE — Progress Notes (Signed)
ANTICOAGULATION CONSULT NOTE  Pharmacy Consult:  Heparin Indication: atrial fibrillation (CHADSVASc = 3)  Patient Measurements: Height: 5\' 10"  (177.8 cm) Weight: 224 lb (101.6 kg) IBW/kg (Calculated) : 73 Heparin Dosing Weight: 92.3kg   Vital Signs: Temp: 98 F (36.7 C) (11/22 0429) Temp Source: Oral (11/22 0429) BP: 120/47 (11/22 0429) Pulse Rate: 64 (11/22 0715)  Labs: Recent Labs    04/06/19 0420 04/07/19 0353 04/08/19 0355  HGB 9.0* 8.5* 8.4*  HCT 29.9* 28.4* 28.5*  PLT 313 281 267  HEPARINUNFRC 0.44 0.57 0.63  CREATININE  --  0.51*  --     Estimated Creatinine Clearance: 74.7 mL/min (A) (by C-G formula based on SCr of 0.51 mg/dL (L)).   Assessment: 89 yoM admitted on 04/03/2019 with perinephric fluid collection requiring nephrostomy tube. Pt on Xarelto PTA for hx AFib (CHADSVASc = 3) which has been held with need for procedures and bridged with IV heparin.   Heparin level is therapeutic and trending up slowly x3 on 2050 units/hr. Urine remains dark, bloody per MD note, 3 drains in place. H/H stable. Will decrease rate slightly.   Goal of Therapy:  Heparin level 0.3-0.7 units/ml Monitor platelets by anticoagulation protocol: Yes   Plan:  -Decrease heparin to 2000 units/hr -Monitor daily HL, CBC, plt -Monitor for signs/symptoms of bleeding    Benetta Spar, PharmD, BCPS, BCCP Clinical Pharmacist  Please check AMION for all [place] phone numbers After 10:00 PM, call [place]

## 2019-04-09 LAB — CBC
HCT: 28.7 % — ABNORMAL LOW (ref 39.0–52.0)
Hemoglobin: 8.5 g/dL — ABNORMAL LOW (ref 13.0–17.0)
MCH: 31.3 pg (ref 26.0–34.0)
MCHC: 29.6 g/dL — ABNORMAL LOW (ref 30.0–36.0)
MCV: 105.5 fL — ABNORMAL HIGH (ref 80.0–100.0)
Platelets: 275 10*3/uL (ref 150–400)
RBC: 2.72 MIL/uL — ABNORMAL LOW (ref 4.22–5.81)
RDW: 15.5 % (ref 11.5–15.5)
WBC: 5.3 10*3/uL (ref 4.0–10.5)
nRBC: 0 % (ref 0.0–0.2)

## 2019-04-09 LAB — HEPARIN LEVEL (UNFRACTIONATED): Heparin Unfractionated: 0.48 IU/mL (ref 0.30–0.70)

## 2019-04-09 MED ORDER — ALBUTEROL SULFATE (2.5 MG/3ML) 0.083% IN NEBU
2.5000 mg | INHALATION_SOLUTION | Freq: Three times a day (TID) | RESPIRATORY_TRACT | Status: DC
  Administered 2019-04-09 – 2019-04-12 (×8): 2.5 mg via RESPIRATORY_TRACT
  Filled 2019-04-09 (×8): qty 3

## 2019-04-09 NOTE — Progress Notes (Signed)
Bipap placed on patient by RT.   Patient resting comfortably.   RR is 20 at this time.  Will continue to assess.

## 2019-04-09 NOTE — Progress Notes (Signed)
PROGRESS NOTE  Adyant Crowson Pesch MMN:[id number] DOB: 1927-04-23 DOA: 04/11/2019 PCP: System, Pcp Not In  HPI/Recap of past 24 hours: 85 y.o.malewith medical history significant foratrial fibrillation on Xarelto, chronic diastolic CHF, asthma/COPD, OSA on BiPAP, hypotension on midodrine, and chronic sacral ulcer, who was brought to the emergency department for evaluation of progressive abdominal distention with alternating constipation and watery stool. Found to have perinephric fluid collection requiring nephrostomy tube. Patient is from long-term nursing home.  He used to walk with a walker and not done so for at least 6 months now.  Mostly wheelchair-bound. Underwent multiple procedures , percutaneous nephrostomy tube on 11/5, 11/11, 04/02/2019 with radiology. Remains in the hospital to complete treatment.  11/21: Patient was seen and examined at his bedside this morning.  He denies dyspnea or chest pain at rest.  Denies dysuria nausea.  Admits to diffuse abdominal pain when palpated.  C[employer] colored urine noted.    11/22: Patient was seen and examined at his bedside this morning.  He is on nasal cannula.  Wheezing noted on exam.  Started nebs treatment every 6 hours.  We will continue bronchodilators.  Patient encouraged to use his BiPAP at night due to CO2 retention.  04/09/19: Patient was seen and examined at his bedside this morning.  No acute events overnight.  Has no new complaints.  Output from drains decreasing.  Hemoglobin stable 8.5.  Assessment/Plan: Principal Problem:   Perinephric fluid collection Active Problems:   Asthma   Paroxysmal Atrial fibrillation - admitted with RVR   Chronic diastolic CHF (congestive heart failure) (HCC)   AF (paroxysmal atrial fibrillation) (HCC)   Hypokalemia   Ileus (HCC)   Closed compression fracture of L4 vertebra (HCC)   Sacral pressure ulcer   Renal cyst, left   Perinephric abscess   Advanced care planning/counseling discussion  Goals of care, counseling/discussion   Palliative care by specialist  Abdominal pain secondary to perinephric fluid collection/left-sided hydronephrosis with urinoma/complicatedby  Proteus mirabilis UTI post percutaneous nephrostomy tube placement on 03/22/2019 and 04/03/2019. 03/22/2019, superior pole nephrostomy tube placed. 03/28/2019, inferior pole nephrostomy tube placed due to collection not drained. 04/02/2019, inadequate drainage and persistent collection, new nephrostomy tubes placed x 3.  Initial urine culture with Proteus.  Blood cultures negative.   Currently on IV Flagyl and Rocephin.   We will plan to DC antibiotics tomorrow 04/10/2019. Interventional radiology following. Continue pain management and continue to closely monitor drain output  Gross hematuria likely secondary to coagulopathy Currently on heparin drip for CVA prevention due to chronic A. Fib Continue heparin drip until no further procedures planned When no planned procedures we will switch back to Xarelto Hemoglobin is stable 8.5 on 04/09/2019 from 8.4 on 04/08/2019 from 8.5 on 04/07/2019.    Acute on chronic hypoxic hypercarbic respiratory failure with concern for aspiration Continue dysphagia 1 diet Continue aspiration precaution  independently reviewed chest x-ray which showed right middle lobe and left lower lobe infiltrates versus atelectasis Procalcitonin negative less than 0.10 04/07/2019. Continue Rocephin and IV Flagyl, plan to DC IV antibiotics on 04/10/2019. Continue BiPAP due to chronic CO2 retention Last ABG showing PCO2 79, pH of 7.310 and PO2 was 90 Continue BiPAP at night  O2 saturation 98% on 3 L  Chronic CO2 retention secondary to OSA and suspected obesity hypoventilation syndrome. Continue BiPAP at night  COPD Continue bronchodilators Continue to maintain O2 saturation greater than 90%  Chronic A. Fib Currently rate controlled On amiodarone for rhythm control On heparin drip  for CVA  prevention  Polyneuropathy Continue gabapentin  Hypotension: Blood pressures fairly stable.  Patient on midodrine that he will continue.  Chronic sacral pressure ulcer stage IV present on admission: Followed by wound care. Stable.  Management per wound care specialist recommendation.  Obstructive sleep apnea: Patient is on BiPAP.  Uses BiPAP at night. Remain hypercarbic  Physical debility/ambulatory dysfunction Continue PT OT with assistance and fall precautions PT recommended SNF CSW assisting with placement.  Goals of care Palliative care team following   Patient will ultimately need ureteric stent by IR once drains are dry, he needs to stay on heparin drip, will stay in the hospital.  DVT prophylaxis: Heparin drip Code Status: Partial Family Communication:  Updated daughter via phone on 04/07/2019. Disposition Plan: Back to nursing home when stable and needing no more surgeries.   Consultants:   Urology  Interventional radiology  Palliative care team  Procedures:   Percutaneous drain, 11/5, 11/11 left kidney  Percutaneous drain, nephrostomy 04/02/2019  Antimicrobials:   Rocephin, 15-Apr-2019---  IV Flagyl 04/06/2019---     Objective: Vitals:   04/09/19 0524 04/09/19 0853 04/09/19 0856 04/09/19 1211  BP: (!) 117/102   (!) 113/43  Pulse: 97 63  (!) 59  Resp: 18 18    Temp: 98.3 F (36.8 C)   97.6 F (36.4 C)  TempSrc: Oral   Oral  SpO2: 97% 96% 96% 98%  Weight: 100.7 kg     Height: 5\' 10"  (1.778 m)       Intake/Output Summary (Last 24 hours) at 04/09/2019 1317 Last data filed at 04/09/2019 1240 Gross per 24 hour  Intake 1994.72 ml  Output 2300 ml  Net -305.28 ml   Filed Weights   04/07/19 0520 04/08/19 0041 04/09/19 0524  Weight: 101.2 kg 101.6 kg 100.7 kg    Exam:  . General: 85 y.o. year-old male pleasant well-developed well-nourished in no acute distress.  Alert oriented x3.  Cardiovascular: Irregular rate and rhythm no rubs  or gallops no JVD or thyromegaly noted.   92 Respiratory: Mild rales at bases no wheezing noted.  Poor respiratory effort. . Abdomen: Obese bowel sounds present.  3 draining tubes present in left lateral abdomen. . Musculoskeletal: 2+ pitting edema in lower extremities bilaterally. Psychiatry: Mood is appropriate for condition and setting.  Data Reviewed: CBC: Recent Labs  Lab 04/05/19 0437 04/06/19 0420 04/07/19 0353 04/08/19 0355 04/09/19 0427  WBC 6.5 6.6 4.8 5.6 5.3  HGB 8.7* 9.0* 8.5* 8.4* 8.5*  HCT 29.8* 29.9* 28.4* 28.5* 28.7*  MCV 104.9* 105.7* 105.6* 105.6* 105.5*  PLT 306 313 281 267 275   Basic Metabolic Panel: Recent Labs  Lab 04/07/19 0353  NA 143  K 4.0  CL 100  CO2 37*  GLUCOSE 106*  BUN 19  CREATININE 0.51*  CALCIUM 9.2   GFR: Estimated Creatinine Clearance: 74.5 mL/min (A) (by C-G formula based on SCr of 0.51 mg/dL (L)). Liver Function Tests: No results for input(s): AST, ALT, ALKPHOS, BILITOT, PROT, ALBUMIN in the last 168 hours. No results for input(s): LIPASE, AMYLASE in the last 168 hours. No results for input(s): AMMONIA in the last 168 hours. Coagulation Profile: No results for input(s): INR, PROTIME in the last 168 hours. Cardiac Enzymes: No results for input(s): CKTOTAL, CKMB, CKMBINDEX, TROPONINI in the last 168 hours. BNP (last 3 results) No results for input(s): PROBNP in the last 8760 hours. HbA1C: No results for input(s): HGBA1C in the last 72 hours. CBG: No results for input(s): GLUCAP  in the last 168 hours. Lipid Profile: No results for input(s): CHOL, HDL, LDLCALC, TRIG, CHOLHDL, LDLDIRECT in the last 72 hours. Thyroid Function Tests: No results for input(s): TSH, T4TOTAL, FREET4, T3FREE, THYROIDAB in the last 72 hours. Anemia Panel: No results for input(s): VITAMINB12, FOLATE, FERRITIN, TIBC, IRON, RETICCTPCT in the last 72 hours. Urine analysis:    Component Value Date/Time   COLORURINE AMBER (A) 03/29/2019 1117    APPEARANCEUR CLOUDY (A) 03/29/2019 1117   LABSPEC 1.017 03/29/2019 1117   PHURINE 8.0 03/29/2019 1117   GLUCOSEU NEGATIVE 03/29/2019 1117   HGBUR LARGE (A) 03/29/2019 1117   BILIRUBINUR NEGATIVE 03/29/2019 1117   KETONESUR NEGATIVE 03/29/2019 1117   PROTEINUR 100 (A) 03/29/2019 1117   UROBILINOGEN 0.2 08/06/2013 1006   NITRITE NEGATIVE 03/29/2019 1117   LEUKOCYTESUR SMALL (A) 03/29/2019 1117   Sepsis Labs: @LABRCNTIP (procalcitonin:4,lacticidven:4)  ) Recent Results (from the past 240 hour(s))  SARS CORONAVIRUS 2 (TAT 6-24 HRS) Nasopharyngeal Nasopharyngeal Swab     Status: None   Collection Time: 04/01/19 11:55 AM   Specimen: Nasopharyngeal Swab  Result Value Ref Range Status   SARS Coronavirus 2 NEGATIVE NEGATIVE Final    Comment: (NOTE) SARS-CoV-2 target nucleic acids are NOT DETECTED. The SARS-CoV-2 RNA is generally detectable in upper and lower respiratory specimens during the acute phase of infection. Negative results do not preclude SARS-CoV-2 infection, do not rule out co-infections with other pathogens, and should not be used as the sole basis for treatment or other patient management decisions. Negative results must be combined with clinical observations, patient history, and epidemiological information. The expected result is Negative. Fact Sheet for Patients: SugarRoll.be Fact Sheet for Healthcare Providers: https://www.woods-mathews.com/ This test is not yet approved or cleared by the [place] FDA and  has been authorized for detection and/or diagnosis of SARS-CoV-2 by FDA under an Emergency Use Authorization (EUA). This EUA will remain  in effect (meaning this test can be used) for the duration of the COVID-19 declaration under Section 56 4(b)(1) of the Act, 21 U.S.C. section 360bbb-3(b)(1), unless the authorization is terminated or revoked sooner. Performed at [facility], [place] [address], [place],  [place] [address]       Studies: No results found.  Scheduled Meds: . albuterol  2.5 mg Nebulization Q6H  . amiodarone  200 mg Oral Daily  . famotidine  20 mg Oral BID  . feeding supplement (PRO-STAT SUGAR FREE 64)  30 mL Oral BID WC  . gabapentin  400 mg Oral TID  . midodrine  10 mg Oral TID WC  . multivitamin with minerals  1 tablet Oral Daily  . nutrition supplement (JUVEN)  1 packet Oral BID BM  . potassium chloride  20 mEq Oral Daily  . Ensure Max Protein  11 oz Oral QHS  . sodium chloride flush  3 mL Intravenous Q12H  . sodium chloride flush  5 mL Intracatheter Q8H  . umeclidinium bromide  1 puff Inhalation Daily    Continuous Infusions: . sodium chloride 250 mL (04/09/19 0713)  . cefTRIAXone (ROCEPHIN)  IV Stopped (04/08/19 2036)  . heparin 2,000 Units/hr (04/09/19 1240)  . metronidazole 500 mg (04/09/19 0714)     LOS: 18 days     Kayleen Memos, MD [facility] Pager [phone]  If 7PM-7AM, please contact night-coverage [email or web address] Password [email or web address] 04/09/2019, 1:17 PM

## 2019-04-09 NOTE — Progress Notes (Signed)
Daily Progress Note   Patient Name: [name]       Date: 04/09/2019 DOB: 1926-07-31  Age: 85 y.o. MRN#: [id number] Attending Physician: Kayleen Memos, DO Primary Care Physician: System, Pcp Not In Admit Date: 04/08/2019  Reason for Consultation/Follow-up: Establishing goals of care  Subjective: Patient is sleeping today. Patient's son is at bedside. He tells me that he has read the Hard Choices book and found it very enlightening, especially the part about letting be and letting go.  He stated that he and his sister are meeting tonight for some continued discussions regarding patient's [facility]. Requested followup tomorrow.    Length of Stay: 18  Current Medications: Scheduled Meds:  . albuterol  2.5 mg Nebulization TID  . amiodarone  200 mg Oral Daily  . famotidine  20 mg Oral BID  . feeding supplement (PRO-STAT SUGAR FREE 64)  30 mL Oral BID WC  . gabapentin  400 mg Oral TID  . midodrine  10 mg Oral TID WC  . multivitamin with minerals  1 tablet Oral Daily  . nutrition supplement (JUVEN)  1 packet Oral BID BM  . potassium chloride  20 mEq Oral Daily  . Ensure Max Protein  11 oz Oral QHS  . sodium chloride flush  3 mL Intravenous Q12H  . sodium chloride flush  5 mL Intracatheter Q8H  . umeclidinium bromide  1 puff Inhalation Daily    Continuous Infusions: . sodium chloride 250 mL (04/09/19 0713)  . cefTRIAXone (ROCEPHIN)  IV Stopped (04/08/19 2036)  . heparin 2,000 Units/hr (04/09/19 1240)  . metronidazole 500 mg (04/09/19 0714)    PRN Meds: sodium chloride, acetaminophen **OR** acetaminophen, albuterol, antiseptic oral rinse, fentaNYL (SUBLIMAZE) injection, Melatonin, ondansetron (ZOFRAN) IV      Vital Signs: BP (!) 113/43   Pulse 64   Temp 97.6 F (36.4 C) (Oral)    Resp 20   Ht 5\' 10"  (1.778 m)   Wt 100.7 kg   SpO2 98%   BMI 31.85 kg/m  SpO2: SpO2: 98 % O2 Device: O2 Device: Nasal Cannula O2 Flow Rate: O2 Flow Rate (L/min): 3 L/min  Intake/output summary:   Intake/Output Summary (Last 24 hours) at 04/09/2019 1630 Last data filed at 04/09/2019 1518 Gross per 24 hour  Intake 2220.77 ml  Output 1860 ml  Net 360.77 ml   LBM: Last BM Date: 04/08/19 Baseline Weight: Weight: 94.8 kg Most recent weight: Weight: 100.7 kg       Palliative Assessment/Data: PPS: 20%      Patient Active Problem List   Diagnosis Date Noted  . Perinephric abscess   . Advanced care planning/counseling discussion   . Goals of care, counseling/discussion   . Palliative care by specialist   . Perinephric fluid collection 04/02/2019  . Ileus (HCC) 03/25/2019  . Closed compression fracture of L4 vertebra (HCC) 03/24/2019  . Sacral pressure ulcer 03/26/2019  . Renal cyst, left 04/01/2019  . Acute encephalopathy 05/14/2017  . Permanent atrial fibrillation (HCC) 05/14/2017  . Chronic respiratory failure with hypoxia (HCC) 05/14/2017  . Chronic combined systolic and diastolic CHF (congestive heart failure) (HCC) 05/14/2017  . 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 Infected pressure ulcer 02/07/2017  . Hypokalemia 02/07/2017  . Normocytic anemia 02/07/2017  . AF (paroxysmal atrial fibrillation) (HCC) 01/31/2017  . Polyneuropathy 01/31/2017  . S/P left TKA 01/24/2017  . Non-ischemic cardiomyopathy (HCC) 10/15/2016  . Chronic diastolic CHF (congestive heart failure) (HCC) 05/28/2016  . Orthostatic hypotension 09/20/2015  . Impaired glucose tolerance 11/25/2014  . Bifascicular block 12/18/2013  . Bilateral edema of lower extremity 10/25/2013  . S/P left THA, AA 08/13/2013  . Chronic anticoagulation 12/06/2012  . General weakness 07/05/2012  . Acute diastolic CHF (congestive heart failure) (HCC) 01/22/2012  . Hypotension 01/18/2012  . Paroxysmal Atrial  fibrillation - admitted with RVR 01/18/2012  . DJD (degenerative joint disease), lumbar 01/18/2012  . Hx of colonic polyps 11/02/2011  . BENIGN PROSTATIC HYPERTROPHY 05/13/2010  . SEBORRHEA CAPITIS 02/13/2010  . CHRONIC RHINITIS 06/02/2009  . Hyperlipidemia 11/01/2008  . INSOMNIA, CHRONIC, MILD 01/02/2008  . Essential hypertension 02/06/2007  . Asthma 02/06/2007    Palliative Care Assessment & Plan   Patient Profile: 85 y.o. male  with past medical history of A. Fib, CHF (EF 40%), sacral wound infection admitted on 03/20/2019 with abdominal pain. Found to have urinoma and abscess d/t renal calculi. Has undergone several procedures through IR for nephrostomy tubes and drain placements. Admission and recovery has been complicated by drains and tubes dislodging and needing to be replaced, as well has likely aspiration pneumonia. He has OSA and has had ongoing hypercapnia requiring Bipap at night which he does not enjoy. 11/20 he was very lethargic and not eating- chest xray showed aspiration pneumonia. He however, has "bounced back" per him and his daughter- eating breakfast and lunch today, alert and oriented. Palliative medicine consulted for GOC.      Assessment/Recommendations/Plan   Continue current care  PMT will followup with patient's daughter tomorrow re: GOC, code status- following her discussion with her brother  Goals of Care and Additional Recommendations:  Limitations on Scope of Treatment: Full Scope Treatment  Code Status:  Limited code  Prognosis:   Unable to determine  Discharge Planning:  To Be Determined  Care plan was discussed with patient's son.  Thank you for allowing the Palliative Medicine Team to assist in the care of this patient.   Time In: 1610 Time Out: 1645 Total Time 35 mins Prolonged Time Billed no      Greater than 50%  of this time was spent counseling and coordinating care related to the above assessment and plan.  Ocie Bob, AGNP-C  Palliative Medicine   Please contact Palliative Medicine Team phone at [phone] for questions and concerns.

## 2019-04-09 NOTE — Progress Notes (Signed)
Referring Physician(s): Dr. Bell/Dr. Margo Aye  Supervising Physician: Irish Lack  Patient Status:  [facility] - In-pt  Chief Complaint: Follow up left PCN and left perinephric abscess drains x 2  Subjective:  Patient with visitor at bedside, denies any complaints. No issues noted with drains per chart.  Allergies: Nsaids, Morphine and related, and Vicodin [hydrocodone-acetaminophen]  Medications: Prior to Admission medications   Medication Sig Start Date End Date Taking? Authorizing Provider  acetaminophen (TYLENOL) 500 MG tablet Take 2 tablets (1,000 mg total) by mouth every 8 (eight) hours. Patient taking differently: Take 1,000 mg by mouth See admin instructions. Take 1,000 mg by mouth every eight hours and do not exceed 4,000 mg/24 hours from all combined sources 01/24/17  Yes Babish, Molli Hazard, [name]  albuterol (PROVENTIL HFA;VENTOLIN HFA) 108 (90 Base) MCG/ACT inhaler Inhale 2 puffs into the lungs every 4 (four) hours as needed for wheezing or shortness of breath.    Yes [provider]  Amino Acids-Protein Hydrolys (FEEDING SUPPLEMENT, PRO-STAT SUGAR FREE 64,) LIQD Take 30 mLs by mouth 3 (three) times daily.    Yes [provider]  amiodarone (PACERONE) 200 MG tablet TAKE ONE TABLET BY MOUTH ONCE DAILY Patient taking differently: Take 200 mg by mouth daily.  10/29/16  Yes Hilty, Lisette Abu, MD  Artificial Saliva (BIOTENE DRY MOUTH MOISTURIZING) SOLN 1 spray by Mouth Rinse route every 2 (two) hours as needed (for dryness).   Yes [provider]  bumetanide (BUMEX) 1 MG tablet Take 1 tablet (1 mg total) by mouth 2 (two) times daily. 10/15/16  Yes Hilty, Lisette Abu, MD  cetirizine (ZYRTEC) 10 MG tablet Take 10 mg by mouth daily.    Yes [provider]  Cholecalciferol (VITAMIN D-3) 25 MCG (1000 UT) CAPS Take 1,000 Units by mouth daily.   Yes [provider]  docusate sodium 100 MG CAPS Take 100 mg by mouth 2 (two) times daily. 08/15/13  Yes Babish,  Molli Hazard, [name]  famotidine (PEPCID) 20 MG tablet Take 1 tablet (20 mg total) by mouth 2 (two) times daily. 02/21/17  Yes Tyrone Nine, MD  ferrous sulfate (FERROUSUL) 325 (65 FE) MG tablet Take 1 tablet (325 mg total) by mouth 3 (three) times daily with meals. Patient taking differently: Take 325 mg by mouth 2 (two) times daily with a meal.  01/24/17  Yes Babish, Molli Hazard, [name]  gabapentin (NEURONTIN) 400 MG capsule TAKE 1 CAPSULE BY MOUTH THREE TIMES DAILY Patient taking differently: Take 400 mg by mouth 3 (three) times daily.  01/14/17  Yes [name], MD  ipratropium-albuterol (DUONEB) 0.5-2.5 (3) MG/3ML SOLN Take 3 mLs by nebulization every 4 (four) hours as needed (wheezing).  03/14/19  Yes [provider]  KLOR-CON M20 20 MEQ tablet Take 20 mEq by mouth daily. 11/11/18  Yes [provider]  midodrine (PROAMATINE) 10 MG tablet Take 1 tablet (10 mg total) by mouth 3 (three) times daily. 05/28/17  Yes Tyrone Nine, MD  Multiple Vitamins-Minerals (CERTAGEN PO) Take 1 tablet by mouth daily.    Yes [provider]  Multiple Vitamins-Minerals (PRESERVISION AREDS 2) CAPS Take 1 capsule by mouth 2 (two) times daily.   Yes [provider]  nutrition supplement, JUVEN, (JUVEN) PACK Take 1 packet by mouth 2 (two) times daily between meals. 02/21/17  Yes Tyrone Nine, MD  Nutritional Supplements (RESOURCE 2.0) LIQD Take 120 mLs by mouth at bedtime.   Yes [provider]  OXYGEN Inhale 2 L/min into  the lungs as needed (for shortness of breath).    Yes [provider]  polyethylene glycol (MIRALAX / GLYCOLAX) packet Take 17 g by mouth 2 (two) times daily. Patient taking differently: Take 17 g by mouth See admin instructions. Mix 17 grams into 4-8 ounces of liquid and drink two times a day 01/24/17  Yes Babish, Rodman Key, [name]  PRESCRIPTION MEDICATION BiPAP: At bedtime   Yes [provider]  rivaroxaban (XARELTO) 20 MG TABS tablet Take 1 tablet  (20 mg total) by mouth daily. 08/05/15  Yes Hilty, Nadean Corwin, MD  rosuvastatin (CRESTOR) 20 MG tablet TAKE ONE TABLET BY MOUTH ONCE A WEEK, FRIDAY Patient taking differently: Take 20 mg by mouth every Friday.  12/24/16  Yes Hilty, Nadean Corwin, MD  simethicone (MYLICON) 902 MG chewable tablet Chew 125 mg by mouth 3 (three) times daily. FOR 5 DAYS March 27, 2019 03/26/19 Yes [provider]  SPIRIVA HANDIHALER 18 MCG inhalation capsule INHALE ONE PUFF BY MOUTH ONCE DAILY Patient taking differently: Place 18 mcg into inhaler and inhale daily.  03/18/16  Yes Marletta Lor, MD  tamsulosin (FLOMAX) 0.4 MG CAPS capsule TAKE 1 CAPSULE BY MOUTH ONCE DAILY Patient taking differently: Take 0.4 mg by mouth daily.  12/24/16  Yes Marletta Lor, MD  traMADol (ULTRAM) 50 MG tablet Take 1-2 tablets (50-100 mg total) by mouth every 12 (twelve) hours as needed for moderate pain or severe pain (and before hydrotherapy). Patient taking differently: Take 50 mg by mouth at bedtime.  05/28/17  Yes Patrecia Pour, MD  protein supplement shake (PREMIER PROTEIN) LIQD Take 325 mLs (11 oz total) by mouth daily. Patient not taking: Reported on 03-27-2019 02/21/17   Patrecia Pour, MD     Vital Signs: BP (!) 117/102 (BP Location: Left Arm)   Pulse 63   Temp 98.3 F (36.8 C) (Oral)   Resp 18   Ht 5\' 10"  (1.778 m)   Wt 222 lb (100.7 kg)   SpO2 96%   BMI 31.85 kg/m   Physical Exam Vitals signs and nursing note reviewed.  Constitutional:      General: He is not in acute distress.    Appearance: He is ill-appearing.  Cardiovascular:     Rate and Rhythm: Normal rate.  Pulmonary:     Effort: Pulmonary effort is normal.  Abdominal:     General: There is no distension.     Palpations: Abdomen is soft.     Tenderness: There is no abdominal tenderness.     Comments: (+) drain #1 to suction with ~10 cc opaque white output. (+) drain #2 to suction with ~15 cc serosanguineous output.   Genitourinary:    Comments:  (+) left PCN with clear, bloody output Skin:    General: Skin is warm and dry.  Neurological:     Mental Status: He is alert. Mental status is at baseline.     Imaging: Dg Chest Port 1 View  Result Date: 04/06/2019 CLINICAL DATA:  Altered mental status, possible aspiration. EXAM: PORTABLE CHEST 1 VIEW COMPARISON:  05/27/2017. FINDINGS: Trachea is midline. Heart is enlarged, stable. Thoracic aorta is calcified. Left mid and lower lung zone airspace consolidation with a left pleural effusion. Biapical pleural thickening. Mild interstitial prominence may be chronic. IMPRESSION: 1. Left mid and lower lung zone airspace consolidation is worrisome for pneumonia. 2. Small left pleural effusion. 3.  Aortic atherosclerosis (ICD10-170.0). Electronically Signed   By: Lorin Picket M.D.   On: 04/06/2019 14:20  Labs:  CBC: Recent Labs    04/06/19 0420 04/07/19 0353 04/08/19 0355 04/09/19 0427  WBC 6.6 4.8 5.6 5.3  HGB 9.0* 8.5* 8.4* 8.5*  HCT 29.9* 28.4* 28.5* 28.7*  PLT 313 281 267 275    COAGS: Recent Labs    03/22/19 0759 03/24/19 1241  INR 1.1  --   APTT  --  36    BMP: Recent Labs    03/29/19 1027 03/30/19 0451 03/31/19 0521 04/07/19 0353  NA 141 139 139 143  K 3.8 4.3 4.4 4.0  CL 98 96* 93* 100  CO2 34* 37* 37* 37*  GLUCOSE 118* 108* 107* 106*  BUN 12 19 21 19   CALCIUM 8.9 9.0 9.3 9.2  CREATININE 0.54* 0.55* 0.61 0.51*  GFRNONAA >60 >60 >60 >60  GFRAA >60 >60 >60 >60    LIVER FUNCTION TESTS: Recent Labs    04-18-19 1749  BILITOT 0.4  AST 19  ALT 15  ALKPHOS 64  PROT 5.6*  ALBUMIN 2.0*    Assessment and Plan:  85 y/o M s/p left PCN and left perinephric fluid collection drains x 2 seen today for routine drain follow up.   Left PCN with bloody output, per I/O 580 cc in last 24H - likely 2/2 coagulopathy, per chart patient on heparin gtt currently. Hgb stable at 8.5 today. Primary team aware and following. Drain #1 to suction, per I/O 10 cc this  shift - output appears purulent consistent with previous exam.  Drain #2 to suction, per I/O 10 cc this shift - output appears serosanguineous consistent with previous exam.  Drains appear stable today. Continue current management - flush drains TID with 5 cc NS, record output Qshift, call IR if difficulty flushing drains. IR will continue to follow - will consider repeat imaging/possible drain injection when output <10 cc/QD excluding flush material.  Please call with questions or concerns.   Electronically Signed: Villa Herb, [name] 04/09/2019, 11:10 AM   I spent a total of 15 Minutes at the the patient's bedside AND on the patient's hospital floor or unit, greater than 50% of which was counseling/coordinating care for follow up left PCN and left perinephric drains x 2.

## 2019-04-09 NOTE — Progress Notes (Signed)
ANTICOAGULATION CONSULT NOTE  Pharmacy Consult:  Heparin Indication: atrial fibrillation (CHADSVASc = 3)  Patient Measurements: Height: 5\' 10"  (177.8 cm) Weight: 222 lb (100.7 kg) IBW/kg (Calculated) : 73 Heparin Dosing Weight: 92.3kg   Vital Signs: Temp: 97.6 F (36.4 C) (11/23 1211) Temp Source: Oral (11/23 1211) BP: 113/43 (11/23 1211) Pulse Rate: 59 (11/23 1211)  Labs: Recent Labs    04/07/19 0353 04/08/19 0355 04/09/19 0427  HGB 8.5* 8.4* 8.5*  HCT 28.4* 28.5* 28.7*  PLT 281 267 275  HEPARINUNFRC 0.57 0.63 0.48  CREATININE 0.51*  --   --     Estimated Creatinine Clearance: 74.5 mL/min (A) (by C-G formula based on SCr of 0.51 mg/dL (L)).   Assessment: 45 yoM admitted on 03/22/19 with perinephric fluid collection requiring nephrostomy tube. Pt on Xarelto PTA for hx AFib (CHADSVASc = 3) which has been held with need for procedures and bridged with IV heparin.   Heparin level is therapeutic and trended down with rate decrease as expected. Urine remains dark, bloody per MD note 11/21, note pending today. Still with 3 drains in place. H/H stable.   Goal of Therapy:  Heparin level 0.3-0.7 units/ml Monitor platelets by anticoagulation protocol: Yes   Plan:  -Continue heparin 2000 units/hr -Monitor daily HL, CBC, plt -Monitor for signs/symptoms of bleeding    Benetta Spar, PharmD, BCPS, BCCP Clinical Pharmacist  Please check AMION for all [facility] phone numbers After 10:00 PM, call [place]

## 2019-04-09 NOTE — Progress Notes (Signed)
Called his daughter Ms. [name] and gave updates. All questions answered to her satisfaction.

## 2019-04-09 NOTE — Progress Notes (Signed)
Physical Therapy Treatment Patient Details Name: [name] MRN: [id number] DOB: Mar 09, 1927 Today's Date: 04/09/2019    History of Present Illness 85 y.o. male with medical history significant for atrial fibrillation on Xarelto, chronic diastolic CHF, asthma/COPD, OSA on BiPAP, hypotension on midodrine, and chronic sacral ulcer, brought to the emergency department for evaluation of progressive abdominal distention with alternating constipation and watery stool.  Found to have perinephric fluid collection requiring nephrostomy tube placement 11/5, 11/11, 11/16.    PT Comments    Pt in bed with son present at bedside upon arrival of PT, agreeable to session. Pt was able to assist with bed mobility, but continues to need maxA of 2 to come to sitting EOB and to assist with repositioning and scooting at EOB to maintain safe seated posture. The pt initially demos a sig posterior and left lean, but was able to progress to sitting at midline with min guard at times. Pt was able to activate core muscles and initiate trunk movements in sitting with use of tactile cues at his shoulders. The pt will continue to benefit from skilled PT to address functional mobility and independence with seated balance to facilitate self-care tasks.    Follow Up Recommendations  SNF;Supervision/Assistance - 24 hour     Equipment Recommendations  None recommended by PT    Recommendations for Other Services       Precautions / Restrictions Precautions Precautions: Fall Precaution Comments: multiple JP drains on L post abdomen Restrictions Weight Bearing Restrictions: No    Mobility  Bed Mobility Overal bed mobility: Needs Assistance Bed Mobility: Rolling;Sidelying to Sit;Sit to Supine Rolling: Max assist Sidelying to sit: Max assist;HOB elevated;+2 for physical assistance   Sit to supine: Max assist;HOB elevated;+2 for physical assistance   General bed mobility comments: Pt able to initiate BLE movement  and assist with BUE using bed rails, but pt still benefits from modA for BLE movement and maxA of+2 for elevation of trunk from bed to achieve full sitting EOB, modA for scooting to reposition at EOB and to maintain upright posture.  Transfers                 General transfer comment: unable  Ambulation/Gait             General Gait Details: unable   Stairs             Wheelchair Mobility    Modified Rankin (Stroke Patients Only)       Balance Overall balance assessment: Needs assistance Sitting-balance support: Bilateral upper extremity supported;Feet supported Sitting balance-Leahy Scale: Poor Sitting balance - Comments: modA initially due to L nd post lean with BUE support, pt able to remove and use single UE support with minA, progressed to min guard at times sitting EOB, but fatigues quickly and requires post support (sat EOB ~14min) Postural control: Posterior lean;Left lateral lean   Standing balance-Leahy Scale: Zero Standing balance comment: unable, pt reports 6 mo since last stood                            Cognition Arousal/Alertness: Awake/alert Behavior During Therapy: WFL for tasks assessed/performed Overall Cognitive Status: Within Functional Limits for tasks assessed                                        Exercises  General Comments General comments (skin integrity, edema, etc.): 3L [place] through session      Pertinent Vitals/Pain Faces Pain Scale: Hurts a little bit Pain Location: back with mobility Pain Descriptors / Indicators: Sore;Aching Pain Intervention(s): Limited activity within patient's tolerance;Monitored during session;Repositioned    Home Living                      Prior Function            PT Goals (current goals can now be found in the care plan section) Acute Rehab PT Goals Patient Stated Goal: To sit up by himself PT Goal Formulation: With patient Time For Goal  Achievement: 05/06/19 Potential to Achieve Goals: Fair Additional Goals Additional Goal #1: Pt will mobilize in [facility] for 50' with modA, utilizing BUE. Progress towards PT goals: Progressing toward goals    Frequency    Min 2X/week      PT Plan Current plan remains appropriate    Co-evaluation              AM-PAC PT "6 Clicks" Mobility   Outcome Measure  Help needed turning from your back to your side while in a flat bed without using bedrails?: A Lot Help needed moving from lying on your back to sitting on the side of a flat bed without using bedrails?: A Lot Help needed moving to and from a bed to a chair (including a wheelchair)?: Total Help needed standing up from a chair using your arms (e.g., wheelchair or bedside chair)?: Total Help needed to walk in hospital room?: Total Help needed climbing 3-5 steps with a railing? : Total 6 Click Score: 8    End of Session Equipment Utilized During Treatment: Oxygen(3L) Activity Tolerance: Patient tolerated treatment well;Patient limited by fatigue Patient left: in bed;with call bell/phone within reach;with bed alarm set;with family/visitor present;with nursing/sitter in room Nurse Communication: Mobility status PT Visit Diagnosis: Muscle weakness (generalized) (M62.81)     Time: 1660-6301 PT Time Calculation (min) (ACUTE ONLY): 24 min  Charges:  $Therapeutic Exercise: 8-22 mins $Therapeutic Activity: 8-22 mins                     [name], PT, DPT   Acute Rehabilitation Department [phone]   [name] 04/09/2019, 2:39 PM

## 2019-04-10 ENCOUNTER — Inpatient Hospital Stay (HOSPITAL_COMMUNITY): Payer: Medicare Other

## 2019-04-10 LAB — CBC WITH DIFFERENTIAL/PLATELET
Abs Immature Granulocytes: 0.19 10*3/uL — ABNORMAL HIGH (ref 0.00–0.07)
Basophils Absolute: 0 10*3/uL (ref 0.0–0.1)
Basophils Relative: 0 %
Eosinophils Absolute: 0 10*3/uL (ref 0.0–0.5)
Eosinophils Relative: 1 %
HCT: 29.7 % — ABNORMAL LOW (ref 39.0–52.0)
Hemoglobin: 8.6 g/dL — ABNORMAL LOW (ref 13.0–17.0)
Immature Granulocytes: 3 %
Lymphocytes Relative: 10 %
Lymphs Abs: 0.7 10*3/uL (ref 0.7–4.0)
MCH: 30.8 pg (ref 26.0–34.0)
MCHC: 29 g/dL — ABNORMAL LOW (ref 30.0–36.0)
MCV: 106.5 fL — ABNORMAL HIGH (ref 80.0–100.0)
Monocytes Absolute: 0.8 10*3/uL (ref 0.1–1.0)
Monocytes Relative: 13 %
Neutro Abs: 4.9 10*3/uL (ref 1.7–7.7)
Neutrophils Relative %: 73 %
Platelets: 266 10*3/uL (ref 150–400)
RBC: 2.79 MIL/uL — ABNORMAL LOW (ref 4.22–5.81)
RDW: 15.9 % — ABNORMAL HIGH (ref 11.5–15.5)
WBC: 6.7 10*3/uL (ref 4.0–10.5)
nRBC: 0 % (ref 0.0–0.2)

## 2019-04-10 LAB — CBC
HCT: 30.3 % — ABNORMAL LOW (ref 39.0–52.0)
Hemoglobin: 8.8 g/dL — ABNORMAL LOW (ref 13.0–17.0)
MCH: 31.3 pg (ref 26.0–34.0)
MCHC: 29 g/dL — ABNORMAL LOW (ref 30.0–36.0)
MCV: 107.8 fL — ABNORMAL HIGH (ref 80.0–100.0)
Platelets: 259 10*3/uL (ref 150–400)
RBC: 2.81 MIL/uL — ABNORMAL LOW (ref 4.22–5.81)
RDW: 15.8 % — ABNORMAL HIGH (ref 11.5–15.5)
WBC: 6.5 10*3/uL (ref 4.0–10.5)
nRBC: 0 % (ref 0.0–0.2)

## 2019-04-10 LAB — COMPREHENSIVE METABOLIC PANEL
ALT: 14 U/L (ref 0–44)
AST: 17 U/L (ref 15–41)
Albumin: 2.3 g/dL — ABNORMAL LOW (ref 3.5–5.0)
Alkaline Phosphatase: 45 U/L (ref 38–126)
Anion gap: 7 (ref 5–15)
BUN: 15 mg/dL (ref 8–23)
CO2: 39 mmol/L — ABNORMAL HIGH (ref 22–32)
Calcium: 8.9 mg/dL (ref 8.9–10.3)
Chloride: 101 mmol/L (ref 98–111)
Creatinine, Ser: 0.57 mg/dL — ABNORMAL LOW (ref 0.61–1.24)
GFR calc Af Amer: >60 mL/min (ref >60)
GFR calc non Af Amer: >60 mL/min (ref >60)
Glucose, Bld: 121 mg/dL — ABNORMAL HIGH (ref 70–99)
Potassium: 3.4 mmol/L — ABNORMAL LOW (ref 3.5–5.1)
Sodium: 147 mmol/L — ABNORMAL HIGH (ref 135–145)
Total Bilirubin: 0.4 mg/dL (ref 0.3–1.2)
Total Protein: 6.1 g/dL — ABNORMAL LOW (ref 6.5–8.1)

## 2019-04-10 LAB — GLUCOSE, CAPILLARY
Glucose-Capillary: 106 mg/dL — ABNORMAL HIGH (ref 70–99)
Glucose-Capillary: 106 mg/dL — ABNORMAL HIGH (ref 70–99)

## 2019-04-10 LAB — BLOOD GAS, ARTERIAL
Acid-Base Excess: 13.1 mmol/L — ABNORMAL HIGH (ref 0.0–2.0)
Acid-Base Excess: 16.9 mmol/L — ABNORMAL HIGH (ref 0.0–2.0)
Bicarbonate: 41.3 mmol/L — ABNORMAL HIGH (ref 20.0–28.0)
Bicarbonate: 43.5 mmol/L — ABNORMAL HIGH (ref 20.0–28.0)
Drawn by: 57060
Drawn by: 51702
FIO2: 100
FIO2: 40
O2 Saturation: 98.9 %
O2 Saturation: 94.1 %
Patient temperature: 36.4
Patient temperature: 36.3
pCO2 arterial: 107 mmHg (ref 32.0–48.0)
pCO2 arterial: 82 mmHg (ref 32.0–48.0)
pH, Arterial: 7.208 — ABNORMAL LOW (ref 7.350–7.450)
pH, Arterial: 7.34 — ABNORMAL LOW (ref 7.350–7.450)
pO2, Arterial: 165 mmHg — ABNORMAL HIGH (ref 83.0–108.0)
pO2, Arterial: 61.8 mmHg — ABNORMAL LOW (ref 83.0–108.0)

## 2019-04-10 LAB — HEPARIN LEVEL (UNFRACTIONATED)
Heparin Unfractionated: 0.12 IU/mL — ABNORMAL LOW (ref 0.30–0.70)
Heparin Unfractionated: 0.58 IU/mL (ref 0.30–0.70)

## 2019-04-10 LAB — PROCALCITONIN: Procalcitonin: <0.1 ng/mL

## 2019-04-10 LAB — MAGNESIUM: Magnesium: 2.2 mg/dL (ref 1.7–2.4)

## 2019-04-10 LAB — LACTIC ACID, PLASMA: Lactic Acid, Venous: 0.6 mmol/L (ref 0.5–1.9)

## 2019-04-10 LAB — PHOSPHORUS: Phosphorus: 3.3 mg/dL (ref 2.5–4.6)

## 2019-04-10 MED ORDER — SODIUM BICARBONATE 8.4 % IV SOLN
INTRAVENOUS | Status: AC
  Administered 2019-04-10: 20:00:00 50 meq via INTRAVENOUS
  Filled 2019-04-10: qty 50

## 2019-04-10 MED ORDER — SODIUM BICARBONATE 8.4 % IV SOLN
50.0000 meq | Freq: Once | INTRAVENOUS | Status: AC
  Administered 2019-04-10: 20:00:00 50 meq via INTRAVENOUS

## 2019-04-10 MED ORDER — FUROSEMIDE 10 MG/ML IJ SOLN
INTRAMUSCULAR | Status: AC
  Administered 2019-04-10: 20 mg
  Filled 2019-04-10: qty 2

## 2019-04-10 MED ORDER — ACETYLCYSTEINE 20 % IN SOLN
4.0000 mL | Freq: Two times a day (BID) | RESPIRATORY_TRACT | Status: DC
  Filled 2019-04-10 (×3): qty 4

## 2019-04-10 NOTE — Progress Notes (Signed)
Nutrition Follow-up  RD working remotely.  DOCUMENTATION CODES:   Not applicable  INTERVENTION:   -ContinueMVI with minerals daily -Continue30 ml Prostat BID with meals, each supplement provides 100 kcals and 15 grams protein -ContinueEnsure Max poq HS, each supplement provides 150 kcal and 30 grams of protein -Continue1 packet Juven BID, each packet provides 95 calories, 2.5 grams of protein (collagen), and 9.8 grams of carbohydrate (3 grams sugar); also contains 7 grams of L-arginine and L-glutamine, 300 mg vitamin C, 15 mg vitamin E, 1.2 mcg vitamin B-12, 9.5 mg zinc, 200 mg calcium, and 1.5 g Calcium Beta-hydroxy-Beta-methylbutyrate to support wound healing  NUTRITION DIAGNOSIS:   Increased nutrient needs related to wound healing as evidenced by estimated needs.  Ongoing  GOAL:   Patient will meet greater than or equal to 90% of their needs  Progressing   MONITOR:   PO intake, Supplement acceptance, Labs, Weight trends, Skin, I & O's  REASON FOR ASSESSMENT:   Low Braden    ASSESSMENT:   [name] is a 85 y.o. male with medical history significant for atrial fibrillation on Xarelto, chronic diastolic CHF, asthma/COPD, OSA on BiPAP, hypotension on midodrine, and chronic sacral ulcer, now presenting to the emergency department for evaluation of progressive abdominal distention with alternating constipation and watery stool.  Patient is accompanied by his daughter who assists with the history.  Patient reportedly developed constipation several days ago, reports some abdominal distention since then, and has had a couple watery stools.  He denies any abdominal pain but reports some mild generalized discomfort.  He denies nausea or vomiting but his daughter notes that he vomited a small amount earlier.  Patient denies any fevers, chills, flank pain, or dysuria.  He denies any change in his chronic back pain.  11/5- s/p lt nephrostomy tube placement, CT guided abscess  drain placement 11/11 -s/p CT guided drainage catheter placement 11/16 -s/p left sided PCN placement  Reviewed I/O's: -699 ml x 24 hours and -3.9 L since 03/27/19  UOP: 1.7 L x 24 hours  Drain output: 545 ml x 24 hours  Per IR notes, considering CT later this week to evaluate abscess for resolution.   Palliative care following for goals of care discussions. Per note this afternoon, pt declined later last week, but "bounced back". Pt with decreased mental status and poor appetite during that time, however, pt's appetite has now returned. Noted meal completion 50-100% of meals. Pt mostly compliant with Ensure Max, Prostat, and Juven supplements, however, refused this afternoon's dose.   Labs reviewed.   Diet Order:   Diet Order            DIET - DYS 1 Room service appropriate? Yes; Fluid consistency: Thin  Diet effective now              EDUCATION NEEDS:   No education needs have been identified at this time  Skin:  Skin Assessment: Skin Integrity Issues: Skin Integrity Issues:: Stage IV Stage IV: sacrum  Last BM:  04/09/19  Height:   Ht Readings from Last 1 Encounters:  04/09/19 5\' 10"  (1.778 m)    Weight:   Wt Readings from Last 1 Encounters:  04/10/19 103.9 kg    Ideal Body Weight:  75.5 kg  BMI:  Body mass index is 32.86 kg/m.  Estimated Nutritional Needs:   Kcal:  2050-2250  Protein:  100-115 grams  Fluid:  > 2 L    [name] A. Jimmye Norman, [place], LDN, [place] Registered Dietitian II  Certified Diabetes Care and Education Specialist Pager: [phone] After hours Pager: [phone]

## 2019-04-10 NOTE — Progress Notes (Signed)
Referring Physician(s): Dr. Bell/Dr. Margo Aye  Supervising Physician: Simonne Come  Patient Status:  [facility] - In-pt  Chief Complaint:  Left hydronephrosis Perinephric fluid collection  Brief History: [name] is an 85 y.o.malewith past medical history of A. Fib, CHF (EF 40%), sacral wound infectionadmitted on 11/4/2020with abdominal pain.   Found to have urinoma and abscess secondary to renal calculi.   He has undergone several procedures by our service for nephrostomy tubes and drain placements.   S/P = 03/22/2019 > CT-guided placement of a drainage catheter within the complex left retroperitoneal abscess collection by Dr. Lowella Dandy. 03/28/2019 > CT guided placement of a 10 [place] all purpose drain catheter into the residual perinephric fluid collections adjacent to the inferior medial aspect the left kidney with aspiration of 30 mL of purulent fluid by  Dr. Grace Isaac. 04/02/2019 > Malpositioned left-sided percutaneous nephrostomy catheter requiring removal and definitive new nephrostomy catheter placement. Successful fluoroscopic guided placement of a new 10.2 French left-sided percutaneous nephrostomy catheter. Malpositioned left-sided perinephric abscess drainage catheters requiring new CT-guided perinephric abscess drainage catheter placement. 04/03/2019 >Successful CT guided placement of a 10 French all purpose drain catheter into residual/recurrent perinephric abscesses with dominant collection yielding 10 cc of purulent fluid and smaller collection yielding 5 cc of purulent fluid by Dr. Grace Isaac.  Subject:  Mr. Hip is resting comfortably. I was able to avoid waking him while I check his drains.  Allergies: Nsaids, Morphine and related, and Vicodin [hydrocodone-acetaminophen]  Medications: Prior to Admission medications   Medication Sig Start Date End Date Taking? Authorizing Provider  acetaminophen (TYLENOL) 500 MG tablet Take 2 tablets (1,000 mg total) by mouth every 8  (eight) hours. Patient taking differently: Take 1,000 mg by mouth See admin instructions. Take 1,000 mg by mouth every eight hours and do not exceed 4,000 mg/24 hours from all combined sources 01/24/17  Yes Babish, Molli Hazard, [name]  albuterol (PROVENTIL HFA;VENTOLIN HFA) 108 (90 Base) MCG/ACT inhaler Inhale 2 puffs into the lungs every 4 (four) hours as needed for wheezing or shortness of breath.    Yes [provider]  Amino Acids-Protein Hydrolys (FEEDING SUPPLEMENT, PRO-STAT SUGAR FREE 64,) LIQD Take 30 mLs by mouth 3 (three) times daily.    Yes [provider]  amiodarone (PACERONE) 200 MG tablet TAKE ONE TABLET BY MOUTH ONCE DAILY Patient taking differently: Take 200 mg by mouth daily.  10/29/16  Yes Hilty, Lisette Abu, MD  Artificial Saliva (BIOTENE DRY MOUTH MOISTURIZING) SOLN 1 spray by Mouth Rinse route every 2 (two) hours as needed (for dryness).   Yes [provider]  bumetanide (BUMEX) 1 MG tablet Take 1 tablet (1 mg total) by mouth 2 (two) times daily. 10/15/16  Yes Hilty, Lisette Abu, MD  cetirizine (ZYRTEC) 10 MG tablet Take 10 mg by mouth daily.    Yes [provider]  Cholecalciferol (VITAMIN D-3) 25 MCG (1000 UT) CAPS Take 1,000 Units by mouth daily.   Yes [provider]  docusate sodium 100 MG CAPS Take 100 mg by mouth 2 (two) times daily. 08/15/13  Yes Babish, Molli Hazard, [name]  famotidine (PEPCID) 20 MG tablet Take 1 tablet (20 mg total) by mouth 2 (two) times daily. 02/21/17  Yes Tyrone Nine, MD  ferrous sulfate (FERROUSUL) 325 (65 FE) MG tablet Take 1 tablet (325 mg total) by mouth 3 (three) times daily with meals. Patient taking differently: Take 325 mg by mouth 2 (two) times daily with a meal.  01/24/17  Yes Lanney Gins, [name]  gabapentin (NEURONTIN) 400 MG capsule TAKE 1 CAPSULE BY MOUTH THREE TIMES DAILY Patient taking differently: Take 400 mg by mouth 3 (three) times daily.  01/14/17  Yes [name], MD  ipratropium-albuterol  (DUONEB) 0.5-2.5 (3) MG/3ML SOLN Take 3 mLs by nebulization every 4 (four) hours as needed (wheezing).  03/14/19  Yes [provider]  KLOR-CON M20 20 MEQ tablet Take 20 mEq by mouth daily. 11/11/18  Yes [provider]  midodrine (PROAMATINE) 10 MG tablet Take 1 tablet (10 mg total) by mouth 3 (three) times daily. 05/28/17  Yes Tyrone Nine, MD  Multiple Vitamins-Minerals (CERTAGEN PO) Take 1 tablet by mouth daily.    Yes [provider]  Multiple Vitamins-Minerals (PRESERVISION AREDS 2) CAPS Take 1 capsule by mouth 2 (two) times daily.   Yes [provider]  nutrition supplement, JUVEN, (JUVEN) PACK Take 1 packet by mouth 2 (two) times daily between meals. 02/21/17  Yes Tyrone Nine, MD  Nutritional Supplements (RESOURCE 2.0) LIQD Take 120 mLs by mouth at bedtime.   Yes [provider]  OXYGEN Inhale 2 L/min into the lungs as needed (for shortness of breath).    Yes [provider]  polyethylene glycol (MIRALAX / GLYCOLAX) packet Take 17 g by mouth 2 (two) times daily. Patient taking differently: Take 17 g by mouth See admin instructions. Mix 17 grams into 4-8 ounces of liquid and drink two times a day 01/24/17  Yes Babish, Molli Hazard, [name]  PRESCRIPTION MEDICATION BiPAP: At bedtime   Yes [provider]  rivaroxaban (XARELTO) 20 MG TABS tablet Take 1 tablet (20 mg total) by mouth daily. 08/05/15  Yes Hilty, Lisette Abu, MD  rosuvastatin (CRESTOR) 20 MG tablet TAKE ONE TABLET BY MOUTH ONCE A WEEK, FRIDAY Patient taking differently: Take 20 mg by mouth every Friday.  12/24/16  Yes Hilty, Lisette Abu, MD  simethicone (MYLICON) 125 MG chewable tablet Chew 125 mg by mouth 3 (three) times daily. FOR 5 DAYS 04/05/2019 03/26/19 Yes [provider]  SPIRIVA HANDIHALER 18 MCG inhalation capsule INHALE ONE PUFF BY MOUTH ONCE DAILY Patient taking differently: Place 18 mcg into inhaler and inhale daily.  03/18/16  Yes [name], MD  tamsulosin  (FLOMAX) 0.4 MG CAPS capsule TAKE 1 CAPSULE BY MOUTH ONCE DAILY Patient taking differently: Take 0.4 mg by mouth daily.  12/24/16  Yes [name], MD  traMADol (ULTRAM) 50 MG tablet Take 1-2 tablets (50-100 mg total) by mouth every 12 (twelve) hours as needed for moderate pain or severe pain (and before hydrotherapy). Patient taking differently: Take 50 mg by mouth at bedtime.  05/28/17  Yes Tyrone Nine, MD  protein supplement shake (PREMIER PROTEIN) LIQD Take 325 mLs (11 oz total) by mouth daily. Patient not taking: Reported on 04/07/2019 02/21/17   Tyrone Nine, MD     Vital Signs: BP (!) 109/49 (BP Location: Right Arm)   Pulse 67   Temp 98.8 F (37.1 C) (Oral)   Resp 20   Ht 5\' 10"  (1.778 m)   Wt 103.9 kg   SpO2 98%   BMI 32.86 kg/m   Physical Exam Sleeping PCN in place.  I emptied 300 mL of dark thin bloody urine. The pigtail drains are in good position Drain 1 = 535 mL output recorded (none in bulb during my exam) = This amount could be a typo or inaccurate due to PCN output inadvertently charted there. Drain 2 = 10 mL output recorded (none  in bulb during my exam)   Imaging: Dg Chest Port 1 View  Result Date: 04/06/2019 CLINICAL DATA:  Altered mental status, possible aspiration. EXAM: PORTABLE CHEST 1 VIEW COMPARISON:  05/27/2017. FINDINGS: Trachea is midline. Heart is enlarged, stable. Thoracic aorta is calcified. Left mid and lower lung zone airspace consolidation with a left pleural effusion. Biapical pleural thickening. Mild interstitial prominence may be chronic. IMPRESSION: 1. Left mid and lower lung zone airspace consolidation is worrisome for pneumonia. 2. Small left pleural effusion. 3.  Aortic atherosclerosis (ICD10-170.0). Electronically Signed   By: Lorin Picket M.D.   On: 04/06/2019 14:20    Labs:  CBC: Recent Labs    04/07/19 0353 04/08/19 0355 04/09/19 0427 04/10/19 0514  WBC 4.8 5.6 5.3 6.5  HGB 8.5* 8.4* 8.5* 8.8*  HCT 28.4* 28.5*  28.7* 30.3*  PLT 281 267 275 259    COAGS: Recent Labs    03/22/19 0759 03/24/19 1241  INR 1.1  --   APTT  --  36    BMP: Recent Labs    03/29/19 1027 03/30/19 0451 03/31/19 0521 04/07/19 0353  NA 141 139 139 143  K 3.8 4.3 4.4 4.0  CL 98 96* 93* 100  CO2 34* 37* 37* 37*  GLUCOSE 118* 108* 107* 106*  BUN 12 19 21 19   CALCIUM 8.9 9.0 9.3 9.2  CREATININE 0.54* 0.55* 0.61 0.51*  GFRNONAA >60 >60 >60 >60  GFRAA >60 >60 >60 >60    LIVER FUNCTION TESTS: Recent Labs    03/22/2019 1749  BILITOT 0.4  AST 19  ALT 15  ALKPHOS 64  PROT 5.6*  ALBUMIN 2.0*    Assessment and Plan:  Urinoma and abscess secondary to renal calculi.  S/P = 03/22/2019 > CT-guided placement of a drainage catheter within the complex left retroperitoneal abscess collection by Dr. Anselm Pancoast. 03/28/2019 > CT guided placement of a 10 [place] all purpose drain catheter into the residual perinephric fluid collections adjacent to the inferior medial aspect the left kidney with aspiration of 30 mL of purulent fluid by  Dr. Pascal Lux. 04/02/2019 > Malpositioned left-sided percutaneous nephrostomy catheter requiring removal and definitive new nephrostomy catheter placement. Successful fluoroscopic guided placement of a new 10.2 French left-sided percutaneous nephrostomy catheter. Malpositioned left-sided perinephric abscess drainage catheters requiring new CT-guided perinephric abscess drainage catheter placement. 04/03/2019 >Successful CT guided placement of a 10 French all purpose drain catheter into residual/recurrent perinephric abscesses with dominant collection yielding 10 cc of purulent fluid and smaller collection yielding 5 cc of purulent fluid by Dr. Pascal Lux.  Hgb 8.8 and WBC 6.5 today  Continue current drain care. Consider CT later in week to evaluate abscess for resolution.  Electronically Signed: Murrell Redden, [name] 04/10/2019, 9:48 AM    I spent a total of 15 Minutes at the the patient's  bedside AND on the patient's hospital floor or unit, greater than 50% of which was counseling/coordinating care for abscess drain and PCN.

## 2019-04-10 NOTE — Progress Notes (Signed)
PROGRESS NOTE  [name] PYK:[id number] DOB: 30-Aug-1926 DOA: 03/18/2019 PCP: System, Pcp Not In  HPI/Recap of past 24 hours: 85 y.o.malewith medical history significant foratrial fibrillation on Xarelto, chronic diastolic CHF, asthma/COPD, OSA on BiPAP, hypotension on midodrine, and chronic sacral ulcer, who was brought to the emergency department for evaluation of progressive abdominal distention with alternating constipation and watery stool. Found to have perinephric fluid collection requiring nephrostomy tube. Patient is from long-term nursing home.  He used to walk with a walker and not done so for at least 6 months now.  Mostly wheelchair-bound. Underwent multiple procedures , percutaneous nephrostomy tube on 11/5, 11/11, 04/02/2019 with radiology. Remains in the hospital to complete treatment.  11/21: Patient was seen and examined at his bedside this morning.  He denies dyspnea or chest pain at rest.  Denies dysuria nausea.  Admits to diffuse abdominal pain when palpated.  [employer] colored urine noted.    11/22: Patient was seen and examined at his bedside this morning.  He is on nasal cannula.  Wheezing noted on exam.  Started nebs treatment every 6 hours.  We will continue bronchodilators.  Patient encouraged to use his BiPAP at night due to CO2 retention.  11/23: Patient was seen and examined at his bedside this morning.  No acute events overnight.  Has no new complaints.  Output from drains decreasing.  Hemoglobin stable 8.5.  04/10/19: Patient was seen and examined at his bedside this morning.  No acute events.  No new complaints.  Seen by interventional radiology, appreciate IR expertise.  Will obtain CT abdomen and pelvis to reassess abscesses on Thursday, 04/12/2019.  Assessment/Plan: Principal Problem:   Perinephric fluid collection Active Problems:   Asthma   Paroxysmal Atrial fibrillation - admitted with RVR   Chronic diastolic CHF (congestive heart failure)  (HCC)   AF (paroxysmal atrial fibrillation) (HCC)   Hypokalemia   Ileus (HCC)   Closed compression fracture of L4 vertebra (HCC)   Sacral pressure ulcer   Renal cyst, left   Perinephric abscess   Advanced care planning/counseling discussion   Goals of care, counseling/discussion   Palliative care by specialist  Abdominal pain secondary to perinephric fluid collection/left-sided hydronephrosis with urinoma/complicatedby  Proteus mirabilis UTI post percutaneous nephrostomy tube placement on 03/22/2019 and 04/03/2019. 03/22/2019, superior pole nephrostomy tube placed. 03/28/2019, inferior pole nephrostomy tube placed due to collection not drained. 04/02/2019, inadequate drainage and persistent collection, new nephrostomy tube.  Initial urine culture with Proteus.  Blood cultures negative.   Left PCN and left perinephric drains x 2 Currently on IV Flagyl and Rocephin.   We will plan to DC antibiotics after CT abd pelvis with contrast anticipated on 04/12/19 if negative Interventional radiology following. Appreciate IR expertise. C/w pain management and continue to closely monitor drain output  Gross hematuria likely secondary to coagulopathy Currently on heparin drip for CVA prevention due to chronic A. Fib Continue heparin drip until no further procedures planned When no planned procedures we will switch back to Xarelto Hemoglobin is stable 8.5 on 04/09/2019 from 8.4 on 04/08/2019 from 8.5 on 04/07/2019.  Hg 8.8 on 11/24. MCV 107.  Acute on chronic hypoxic hypercarbic respiratory failure with concern for aspiration On 2L continuously at home at baseline On 3L with sats 94% currently C/w bronchodialators and nebs Continue dysphagia 1 diet Continue aspiration precaution  independently reviewed chest x-ray which showed right middle lobe and left lower lobe infiltrates versus atelectasis Procalcitonin negative less than 0.10 04/07/2019. Continue Rocephin and  IV Flagyl, plan to DC IV  antibiotics per above. Continue BiPAP due to chronic CO2 retention Last ABG showing PCO2 79, pH of 7.310 and PO2 was 90 Continue BiPAP at night  O2 saturation 98% on 3 L  Chronic CO2 retention secondary to OSA and suspected obesity hypoventilation syndrome. Continue BiPAP at night  COPD Continue bronchodilators Continue to maintain O2 saturation greater than 90%  Chronic A. Fib Currently rate controlled On amiodarone for rhythm control On heparin drip for CVA prevention  Polyneuropathy Continue gabapentin  Hypotension: Blood pressures fairly stable.  Patient on midodrine that he will continue.  Chronic sacral pressure ulcer stage IV present on admission: Followed by wound care. Stable.  Management per wound care specialist recommendation.  Obstructive sleep apnea: Patient is on BiPAP.  Uses BiPAP at night. Remain hypercarbic  Physical debility/ambulatory dysfunction Continue PT OT with assistance and fall precautions PT recommended SNF CSW assisting with placement.  Goals of care Palliative care team following   Patient will ultimately need ureteric stent by IR once drains are dry, he needs to stay on heparin drip, will stay in the hospital.  DVT prophylaxis: Heparin drip Code Status: Partial Family Communication:  Updated daughter via phone on 04/07/2019. Disposition Plan: Back to nursing home when stable and needing no more surgeries.   Consultants:   Urology  Interventional radiology  Palliative care team  Procedures:   Percutaneous drain, 11/5, 11/11 left kidney  Percutaneous drain, nephrostomy 04/02/2019  Antimicrobials:   Rocephin, 04/02/2019---  IV Flagyl 04/06/2019---     Objective: Vitals:   04/10/19 0012 04/10/19 0419 04/10/19 0424 04/10/19 0809  BP:  (!) 109/49    Pulse: 62 67    Resp:  20    Temp:  98.8 F (37.1 C)    TempSrc:  Oral    SpO2: 98%   94%  Weight:   103.9 kg   Height:        Intake/Output Summary (Last  24 hours) at 04/10/2019 1249 Last data filed at 04/10/2019 0900 Gross per 24 hour  Intake 1031.05 ml  Output 1175 ml  Net -143.95 ml   Filed Weights   04/08/19 0041 04/09/19 0524 04/10/19 0424  Weight: 101.6 kg 100.7 kg 103.9 kg    Exam:  . General: 85 y.o. year-old male pleasant obese in no acute distress.  Alert oriented x3.   . Cardiovascular: Irregularly regular no rubs or gallops. [name] Respiratory: Mild rales at bases no wheezing noted.   . Abdomen: Obese bowel sounds present.  3 tubes left lateral abdominal quadrant.   . Musculoskeletal: 2+ pitting edema in lower extremities bilaterally. Psychiatry: Mood is appropriate for condition and setting.  Data Reviewed: CBC: Recent Labs  Lab 04/06/19 0420 04/07/19 0353 04/08/19 0355 04/09/19 0427 04/10/19 0514  WBC 6.6 4.8 5.6 5.3 6.5  HGB 9.0* 8.5* 8.4* 8.5* 8.8*  HCT 29.9* 28.4* 28.5* 28.7* 30.3*  MCV 105.7* 105.6* 105.6* 105.5* 107.8*  PLT 313 281 267 275 259   Basic Metabolic Panel: Recent Labs  Lab 04/07/19 0353  NA 143  K 4.0  CL 100  CO2 37*  GLUCOSE 106*  BUN 19  CREATININE 0.51*  CALCIUM 9.2   GFR: Estimated Creatinine Clearance: 75.6 mL/min (A) (by C-G formula based on SCr of 0.51 mg/dL (L)). Liver Function Tests: No results for input(s): AST, ALT, ALKPHOS, BILITOT, PROT, ALBUMIN in the last 168 hours. No results for input(s): LIPASE, AMYLASE in the last 168 hours. No results for input(s): AMMONIA  in the last 168 hours. Coagulation Profile: No results for input(s): INR, PROTIME in the last 168 hours. Cardiac Enzymes: No results for input(s): CKTOTAL, CKMB, CKMBINDEX, TROPONINI in the last 168 hours. BNP (last 3 results) No results for input(s): PROBNP in the last 8760 hours. HbA1C: No results for input(s): HGBA1C in the last 72 hours. CBG: No results for input(s): GLUCAP in the last 168 hours. Lipid Profile: No results for input(s): CHOL, HDL, LDLCALC, TRIG, CHOLHDL, LDLDIRECT in the last 72  hours. Thyroid Function Tests: No results for input(s): TSH, T4TOTAL, FREET4, T3FREE, THYROIDAB in the last 72 hours. Anemia Panel: No results for input(s): VITAMINB12, FOLATE, FERRITIN, TIBC, IRON, RETICCTPCT in the last 72 hours. Urine analysis:    Component Value Date/Time   COLORURINE AMBER (A) 03/29/2019 1117   APPEARANCEUR CLOUDY (A) 03/29/2019 1117   LABSPEC 1.017 03/29/2019 1117   PHURINE 8.0 03/29/2019 1117   GLUCOSEU NEGATIVE 03/29/2019 1117   HGBUR LARGE (A) 03/29/2019 1117   BILIRUBINUR NEGATIVE 03/29/2019 1117   KETONESUR NEGATIVE 03/29/2019 1117   PROTEINUR 100 (A) 03/29/2019 1117   UROBILINOGEN 0.2 08/06/2013 1006   NITRITE NEGATIVE 03/29/2019 1117   LEUKOCYTESUR SMALL (A) 03/29/2019 1117   Sepsis Labs: @LABRCNTIP (procalcitonin:4,lacticidven:4)  ) Recent Results (from the past 240 hour(s))  SARS CORONAVIRUS 2 (TAT 6-24 HRS) Nasopharyngeal Nasopharyngeal Swab     Status: None   Collection Time: 04/01/19 11:55 AM   Specimen: Nasopharyngeal Swab  Result Value Ref Range Status   SARS Coronavirus 2 NEGATIVE NEGATIVE Final    Comment: (NOTE) SARS-CoV-2 target nucleic acids are NOT DETECTED. The SARS-CoV-2 RNA is generally detectable in upper and lower respiratory specimens during the acute phase of infection. Negative results do not preclude SARS-CoV-2 infection, do not rule out co-infections with other pathogens, and should not be used as the sole basis for treatment or other patient management decisions. Negative results must be combined with clinical observations, patient history, and epidemiological information. The expected result is Negative. Fact Sheet for Patients: HairSlick.nohttps://www.fda.gov/media/138098/download Fact Sheet for Healthcare Providers: quierodirigir.comhttps://www.fda.gov/media/138095/download This test is not yet approved or cleared by the [place] FDA and  has been authorized for detection and/or diagnosis of SARS-CoV-2 by FDA under an Emergency Use  Authorization (EUA). This EUA will remain  in effect (meaning this test can be used) for the duration of the COVID-19 declaration under Section 56 4(b)(1) of the Act, 21 U.S.C. section 360bbb-3(b)(1), unless the authorization is terminated or revoked sooner. Performed at [facility], [address] [place], [place] [address]       Studies: No results found.  Scheduled Meds: . albuterol  2.5 mg Nebulization TID  . amiodarone  200 mg Oral Daily  . famotidine  20 mg Oral BID  . feeding supplement (PRO-STAT SUGAR FREE 64)  30 mL Oral BID WC  . gabapentin  400 mg Oral TID  . midodrine  10 mg Oral TID WC  . multivitamin with minerals  1 tablet Oral Daily  . nutrition supplement (JUVEN)  1 packet Oral BID BM  . potassium chloride  20 mEq Oral Daily  . Ensure Max Protein  11 oz Oral QHS  . sodium chloride flush  3 mL Intravenous Q12H  . sodium chloride flush  5 mL Intracatheter Q8H  . umeclidinium bromide  1 puff Inhalation Daily    Continuous Infusions: . sodium chloride 250 mL (04/09/19 0713)  . cefTRIAXone (ROCEPHIN)  IV 2 g (04/09/19 1805)  . heparin 2,000 Units/hr (  04/10/19 0129)  . metronidazole 500 mg (04/10/19 0908)     LOS: 19 days     Darlin Drop, MD [facility] Pager [phone]  If 7PM-7AM, please contact night-coverage [email or web address] Password [facility] 04/10/2019, 12:49 PM

## 2019-04-10 NOTE — Progress Notes (Signed)
ANTICOAGULATION CONSULT NOTE  Pharmacy Consult:  Heparin Indication: atrial fibrillation (CHADSVASc = 3)  Patient Measurements: Height: 5\' 10"  (177.8 cm) Weight: 229 lb (103.9 kg) IBW/kg (Calculated) : 73 Heparin Dosing Weight: 92.3kg   Vital Signs: Temp: 97.4 F (36.3 C) (11/24 1255) Temp Source: Oral (11/24 1255) BP: 125/50 (11/24 1255) Pulse Rate: 62 (11/24 1431)  Labs: Recent Labs    04/08/19 0355 04/09/19 0427 04/10/19 0514 04/10/19 1453  HGB 8.4* 8.5* 8.8*  --   HCT 28.5* 28.7* 30.3*  --   PLT 267 275 259  --   HEPARINUNFRC 0.63 0.48 0.12* 0.58    Estimated Creatinine Clearance: 75.6 mL/min (A) (by C-G formula based on SCr of 0.51 mg/dL (L)).   Assessment: 49 yoM admitted on 15-Apr-2019 with perinephric fluid collection requiring nephrostomy tube. Pt on Xarelto PTA for hx AFib (CHADSVASc = 3) which has been held with need for procedures and bridged with IV heparin.   Heparin level dropped suddenly to barely detectable. Per RN, no report of issues overnight or on current shift with infusion. Went to patient's room and pump looks like it's infusing but unable to check connection at PIV without waking patient, who was on Bipap with family and another provider in the room. Stat repeat HL ordered and was therapeutic 0.58. Of note, this happened on 11/17 as well.   Goal of Therapy:  Heparin level 0.3-0.7 units/ml Monitor platelets by anticoagulation protocol: Yes   Plan:  -Continue heparin 2000 units/hr -Monitor daily HL, CBC, plt -Monitor for signs/symptoms of bleeding    Benetta Spar, PharmD, BCPS, BCCP Clinical Pharmacist  Please check AMION for all [place] phone numbers After 10:00 PM, call [place]

## 2019-04-10 NOTE — Significant Event (Addendum)
Rapid Response Event Note  Overview: Respiratory Distress and Decreased LOC  Initial Focused Assessment: Received a call from nursing staff, patient was in distress. Upon arrival, oxygen saturations were in 60% on 4L [place] - patient was not on a BIPAP, it was taken off to administer medications. While medications were being given, patient potentially aspirated per nurse. I immediately asked the staff to get a NRB and I quickly placed the patient NRB 15L - after 5-6 minutes, saturations improved to 99%. Patient had ongoing increased WOB and mild tachypnea. Lung sounds - mild rales throughout, very diminished. Patient is still gurgling at times. SBP 130-140s, HR 70s. RR upper 20s - mids 30s. Patient was very lethargic, would follow commands at times.  Interventions: -- STAT CXR -- STAT ABG -- Lasix 20 mg IV  -- BIPAP  Plan of Care: -- CO2 107 -- patient was NTS x 2 -- then placed on BIPAP - to remain on BIPAP for now, at least til morning.  -- Aspiration Precautions  -- DNR  -- Provided emotional support   Event Summary:  Start Time 1813 Arrival Time [place], [place]

## 2019-04-10 NOTE — Progress Notes (Signed)
Discussed care with the patient's daughter Angela Nevin. Ok to use BIPAP. PH 7.2 with PCO2 107. Evidence of aspiration on bedside suctioning with food content retracted. Personally reviewed CXR done at bedside which shows pulmonary edema, cardiomegaly, and b/l pleural effusions. Received 1 dose IV lasix 20 mg once, judiciously, due to soft BPs. Daughter in the room. She states she would like to see him hold on until Thanksgiving.

## 2019-04-10 NOTE — Progress Notes (Signed)
Daily Progress Note   Patient Name: [name]       Date: 04/10/2019 DOB: Jul 10, 1926  Age: 85 y.o. MRN#: [id number] Attending Physician: Kayleen Memos, DO Primary Care Physician: System, Pcp Not In Admit Date: 03/30/2019  Reason for Consultation/Follow-up: Establishing goals of care  Subjective: Patient in bed, on Bipap. Per daughter he is not waking up, but was awake earlier today and ate breakfast- did not eat lunch.  Discussed GOC with Angela Nevin- she notes that patient's desire when alert is to continue full scope care. She wishes to continue current care- but would revisit GOC if patient declines or continues to be unresponsive on bipap. She notes that last Friday when he declined she was ready to transition him to comfort measures, and she told him to go if he was ready, however, after that he perked up, got stronger, was eating and alert and saying he wanted to continue forward with life prolonging care and agreed to bipap.  We discussed code status- Angela Nevin states for now to keep limited code status- limited to chest compressions, cardioversion, IV medications- but no intubation.  We discussed that often cardiac and respiratory resuscitation go hand in hand. Further discussed the likely trauma if patient were to undergo code. Angela Nevin states she will discuss this with her brother.   Review of Systems  Unable to perform ROS: Acuity of condition    Length of Stay: 19  Current Medications: Scheduled Meds:  . albuterol  2.5 mg Nebulization TID  . amiodarone  200 mg Oral Daily  . famotidine  20 mg Oral BID  . feeding supplement (PRO-STAT SUGAR FREE 64)  30 mL Oral BID WC  . gabapentin  400 mg Oral TID  . midodrine  10 mg Oral TID WC  . multivitamin with minerals  1 tablet Oral Daily  .  nutrition supplement (JUVEN)  1 packet Oral BID BM  . potassium chloride  20 mEq Oral Daily  . Ensure Max Protein  11 oz Oral QHS  . sodium chloride flush  3 mL Intravenous Q12H  . sodium chloride flush  5 mL Intracatheter Q8H  . umeclidinium bromide  1 puff Inhalation Daily    Continuous Infusions: . sodium chloride 250 mL (04/09/19 0713)  . cefTRIAXone (ROCEPHIN)  IV 2 g (04/09/19 1805)  .  heparin 2,000 Units/hr (04/10/19 0129)  . metronidazole 500 mg (04/10/19 0908)    PRN Meds: sodium chloride, acetaminophen **OR** acetaminophen, albuterol, antiseptic oral rinse, fentaNYL (SUBLIMAZE) injection, Melatonin, ondansetron (ZOFRAN) IV  Physical Exam Vitals signs and nursing note reviewed.  Constitutional:      Appearance: He is normal weight.  Pulmonary:     Comments: On bipap Neurological:     Comments: lethargic             Vital Signs: BP (!) 125/50 (BP Location: Right Arm)   Pulse 62   Temp (!) 97.4 F (36.3 C) (Oral)   Resp (!) 26   Ht 5\' 10"  (1.778 m)   Wt 103.9 kg   SpO2 95%   BMI 32.86 kg/m  SpO2: SpO2: 95 % O2 Device: O2 Device: Bi-PAP O2 Flow Rate: O2 Flow Rate (L/min): 3 L/min  Intake/output summary:   Intake/Output Summary (Last 24 hours) at 04/10/2019 1535 Last data filed at 04/10/2019 1442 Gross per 24 hour  Intake 685 ml  Output 1175 ml  Net -490 ml   LBM: Last BM Date: 04/09/19 Baseline Weight: Weight: 94.8 kg Most recent weight: Weight: 103.9 kg       Palliative Assessment/Data: PPS: 20%     Patient Active Problem List   Diagnosis Date Noted  . Perinephric abscess   . Advanced care planning/counseling discussion   . Goals of care, counseling/discussion   . Palliative care by specialist   . Perinephric fluid collection 04/09/2019  . Ileus (HCC) 03/20/2019  . Closed compression fracture of L4 vertebra (HCC) 04/14/2019  . Sacral pressure ulcer 04/02/2019  . Renal cyst, left 04/05/2019  . Acute encephalopathy 05/14/2017  .  Permanent atrial fibrillation (HCC) 05/14/2017  . Chronic respiratory failure with hypoxia (HCC) 05/14/2017  . Chronic combined systolic and diastolic CHF (congestive heart failure) (HCC) 05/14/2017  . 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 Infected pressure ulcer 02/07/2017  . Hypokalemia 02/07/2017  . Normocytic anemia 02/07/2017  . AF (paroxysmal atrial fibrillation) (HCC) 01/31/2017  . Polyneuropathy 01/31/2017  . S/P left TKA 01/24/2017  . Non-ischemic cardiomyopathy (HCC) 10/15/2016  . Chronic diastolic CHF (congestive heart failure) (HCC) 05/28/2016  . Orthostatic hypotension 09/20/2015  . Impaired glucose tolerance 11/25/2014  . Bifascicular block 12/18/2013  . Bilateral edema of lower extremity 10/25/2013  . S/P left THA, AA 08/13/2013  . Chronic anticoagulation 12/06/2012  . General weakness 07/05/2012  . Acute diastolic CHF (congestive heart failure) (HCC) 01/22/2012  . Hypotension 01/18/2012  . Paroxysmal Atrial fibrillation - admitted with RVR 01/18/2012  . DJD (degenerative joint disease), lumbar 01/18/2012  . Hx of colonic polyps 11/02/2011  . BENIGN PROSTATIC HYPERTROPHY 05/13/2010  . SEBORRHEA CAPITIS 02/13/2010  . CHRONIC RHINITIS 06/02/2009  . Hyperlipidemia 11/01/2008  . INSOMNIA, CHRONIC, MILD 01/02/2008  . Essential hypertension 02/06/2007  . Asthma 02/06/2007    Palliative Care Assessment & Plan   Patient Profile: 85 y.o.malewith past medical history of A. Fib, CHF (EF 40%), sacral wound infectionadmitted on 11/4/2020with abdominal pain. Found to have urinoma and abscess d/t renal calculi. Has undergone several procedures through IR for nephrostomy tubes and drain placements. Admission and recovery has been complicated by drains and tubes dislodging and needing to be replaced, as well has likely aspiration pneumonia. He has OSA and has had ongoing hypercapnia requiring Bipap at night which he does not enjoy. 11/20 he was very lethargic and not  eating- chest xray showed aspiration pneumonia. Hehowever, has "bounced back" per him  and his daughter- eating breakfast and lunch today, alert and oriented. Palliative medicine consulted for GOC.  Assessment/Recommendations/Plan   Continue current care  Gave daughter PMT contact information- she will call with questions or if she wishes to change code status, or change trajectory of care- as GOC are set for now, PMT will shadow unless called   Recommend outpatient Palliative to follow at discharge  Goals of Care and Additional Recommendations:  Limitations on Scope of Treatment: Full Scope Treatment  Code Status:  Limited code- no intubation  Prognosis:   Unable to determine  Discharge Planning:  Skilled nursing facility with Palliative  Care plan was discussed with patient's daughterFrances Nickels.   Thank you for allowing the Palliative Medicine Team to assist in the care of this patient.   Time In: 1415 Time Out: 1450 Total Time 35 mins Prolonged Time Billed no      Greater than 50%  of this time was spent counseling and coordinating care related to the above assessment and plan.  Ocie Bob, AGNP-C Palliative Medicine   Please contact Palliative Medicine Team phone at [phone] for questions and concerns.

## 2019-04-10 NOTE — Progress Notes (Signed)
Respiratory distress noted later today around 1830. Daughter in the room. She made change in code status. He is now DNR per his daughter Angela Nevin. No CPR or intubation.

## 2019-04-11 ENCOUNTER — Inpatient Hospital Stay (HOSPITAL_COMMUNITY): Payer: Medicare Other

## 2019-04-11 LAB — BASIC METABOLIC PANEL
Anion gap: 5 (ref 5–15)
BUN: 11 mg/dL (ref 8–23)
CO2: 40 mmol/L — ABNORMAL HIGH (ref 22–32)
Calcium: 8.7 mg/dL — ABNORMAL LOW (ref 8.9–10.3)
Chloride: 100 mmol/L (ref 98–111)
Creatinine, Ser: 0.55 mg/dL — ABNORMAL LOW (ref 0.61–1.24)
GFR calc Af Amer: >60 mL/min (ref >60)
GFR calc non Af Amer: >60 mL/min (ref >60)
Glucose, Bld: 107 mg/dL — ABNORMAL HIGH (ref 70–99)
Potassium: 3.1 mmol/L — ABNORMAL LOW (ref 3.5–5.1)
Sodium: 145 mmol/L (ref 135–145)

## 2019-04-11 LAB — GLUCOSE, CAPILLARY
Glucose-Capillary: 109 mg/dL — ABNORMAL HIGH (ref 70–99)
Glucose-Capillary: 90 mg/dL (ref 70–99)
Glucose-Capillary: 94 mg/dL (ref 70–99)
Glucose-Capillary: 94 mg/dL (ref 70–99)

## 2019-04-11 LAB — BLOOD GAS, ARTERIAL
Acid-Base Excess: 15.9 mmol/L — ABNORMAL HIGH (ref 0.0–2.0)
Bicarbonate: 42.7 mmol/L — ABNORMAL HIGH (ref 20.0–28.0)
FIO2: 40
O2 Saturation: 93.8 %
Patient temperature: 37
pCO2 arterial: 86.7 mmHg (ref 32.0–48.0)
pH, Arterial: 7.314 — ABNORMAL LOW (ref 7.350–7.450)
pO2, Arterial: 71.2 mmHg — ABNORMAL LOW (ref 83.0–108.0)

## 2019-04-11 LAB — CBC
HCT: 29.4 % — ABNORMAL LOW (ref 39.0–52.0)
Hemoglobin: 8.7 g/dL — ABNORMAL LOW (ref 13.0–17.0)
MCH: 31.6 pg (ref 26.0–34.0)
MCHC: 29.6 g/dL — ABNORMAL LOW (ref 30.0–36.0)
MCV: 106.9 fL — ABNORMAL HIGH (ref 80.0–100.0)
Platelets: 264 10*3/uL (ref 150–400)
RBC: 2.75 MIL/uL — ABNORMAL LOW (ref 4.22–5.81)
RDW: 16 % — ABNORMAL HIGH (ref 11.5–15.5)
WBC: 7.2 10*3/uL (ref 4.0–10.5)
nRBC: 0 % (ref 0.0–0.2)

## 2019-04-11 LAB — HEPARIN LEVEL (UNFRACTIONATED): Heparin Unfractionated: 0.61 IU/mL (ref 0.30–0.70)

## 2019-04-11 MED ORDER — IOHEXOL 300 MG/ML  SOLN
100.0000 mL | Freq: Once | INTRAMUSCULAR | Status: AC | PRN
  Administered 2019-04-11: 16:00:00 100 mL via INTRAVENOUS

## 2019-04-11 MED ORDER — DEXTROSE 10 % IV SOLN
INTRAVENOUS | Status: DC
  Administered 2019-04-12: 02:00:00 via INTRAVENOUS

## 2019-04-11 MED ORDER — POTASSIUM CHLORIDE 10 MEQ/100ML IV SOLN
10.0000 meq | INTRAVENOUS | Status: AC
  Administered 2019-04-11 – 2019-04-12 (×5): 10 meq via INTRAVENOUS
  Filled 2019-04-11 (×3): qty 100

## 2019-04-11 MED ORDER — SODIUM CHLORIDE 0.9% FLUSH
10.0000 mL | INTRAVENOUS | Status: DC | PRN
  Administered 2019-04-13: 06:00:00 10 mL
  Filled 2019-04-11: qty 40

## 2019-04-11 MED ORDER — POTASSIUM CHLORIDE 10 MEQ/100ML IV SOLN
10.0000 meq | INTRAVENOUS | Status: AC
  Administered 2019-04-11 (×2): 10 meq via INTRAVENOUS
  Filled 2019-04-11 (×2): qty 100

## 2019-04-11 MED ORDER — ACETYLCYSTEINE 20 % IN SOLN
4.0000 mL | Freq: Two times a day (BID) | RESPIRATORY_TRACT | Status: DC
  Administered 2019-04-11 – 2019-04-13 (×5): 4 mL via RESPIRATORY_TRACT
  Filled 2019-04-11 (×6): qty 4

## 2019-04-11 MED ORDER — SODIUM CHLORIDE 0.9% FLUSH
10.0000 mL | Freq: Two times a day (BID) | INTRAVENOUS | Status: DC
  Administered 2019-04-11 – 2019-04-12 (×3): 10 mL

## 2019-04-11 NOTE — Progress Notes (Signed)
ANTICOAGULATION CONSULT NOTE  Pharmacy Consult:  Heparin Indication: atrial fibrillation (CHADSVASc = 3)  Patient Measurements: Height: 5\' 10"  (177.8 cm) Weight: 227 lb (103 kg) IBW/kg (Calculated) : 73 Heparin Dosing Weight: 92.3kg   Vital Signs: Temp: 98.1 F (36.7 C) (11/25 0944) Temp Source: Oral (11/25 0944) BP: 141/62 (11/25 1220) Pulse Rate: 64 (11/25 1220)  Labs: Recent Labs    04/09/19 0427 04/10/19 0514 04/10/19 1453 04/10/19 1953 04/11/19 0434  HGB 8.5* 8.8*  --  8.6*  --   HCT 28.7* 30.3*  --  29.7*  --   PLT 275 259  --  266  --   HEPARINUNFRC 0.48 0.12* 0.58  --  0.61  CREATININE  --   --   --  0.57*  --     Estimated Creatinine Clearance: 75.3 mL/min (A) (by C-G formula based on SCr of 0.57 mg/dL (L)).   Assessment: 68 yoM admitted on 2019-03-29 with perinephric fluid collection requiring nephrostomy tube. Pt on Xarelto PTA for hx AFib (CHADSVASc = 3) which has been held with need for procedures and bridged with IV heparin.   Heparin level 0.61. Consider decreasing slightly given bleeding risk with drains and relatively low clot risk if HL continues to uptrend. Will decrease CBC monitoring to QTue/Fri given stability.   Goal of Therapy:  Heparin level 0.3-0.7 units/ml Monitor platelets by anticoagulation protocol: Yes   Plan:  -Continue heparin 2000 units/hr -Monitor daily HL; QTue/Fri CBC, plt -Monitor for signs/symptoms of bleeding    Benetta Spar, PharmD, BCPS, BCCP Clinical Pharmacist  Please check AMION for all [place] phone numbers After 10:00 PM, call [place]

## 2019-04-11 NOTE — Progress Notes (Signed)
Pt was transported to and from CT on BiPAP machine. There were complications and pt is back in his room resting. RT will continue to monitor.

## 2019-04-11 NOTE — Progress Notes (Signed)
PT Cancellation Note  Patient Details Name: [name] MRN: [id number] DOB: May 01, 1927   Cancelled Treatment:    Reason Eval/Treat Not Completed: Medical issues which prohibited therapy Pt on continuous Bipap and RN reports pt has poor tolerance to repositioning in bed due to labored breathing. PT held for today and will continue to follow acutely.    Earney Navy, PTA Acute Rehabilitation Services Pager: [phone] Office: [phone]   04/11/2019, 11:48 AM

## 2019-04-11 NOTE — Progress Notes (Signed)
Referring Physician(s): Dr. Bell/Dr. Margo Aye  Supervising Physician: Irish Lack  Patient Status:  [facility] - In-pt  Chief Complaint: Follow up left PCN and left perinephric abscess drains x 2  Subjective:  Patient alert, on BiPAP, able to answer some questions - asking when he can go home. Denies any pain. No family/visitors at bedside.   Allergies: Nsaids, Morphine and related, and Vicodin [hydrocodone-acetaminophen]  Medications: Prior to Admission medications   Medication Sig Start Date End Date Taking? Authorizing Provider  acetaminophen (TYLENOL) 500 MG tablet Take 2 tablets (1,000 mg total) by mouth every 8 (eight) hours. Patient taking differently: Take 1,000 mg by mouth See admin instructions. Take 1,000 mg by mouth every eight hours and do not exceed 4,000 mg/24 hours from all combined sources 01/24/17  Yes Babish, Molli Hazard, [name]  albuterol (PROVENTIL HFA;VENTOLIN HFA) 108 (90 Base) MCG/ACT inhaler Inhale 2 puffs into the lungs every 4 (four) hours as needed for wheezing or shortness of breath.    Yes [provider]  Amino Acids-Protein Hydrolys (FEEDING SUPPLEMENT, PRO-STAT SUGAR FREE 64,) LIQD Take 30 mLs by mouth 3 (three) times daily.    Yes [provider]  amiodarone (PACERONE) 200 MG tablet TAKE ONE TABLET BY MOUTH ONCE DAILY Patient taking differently: Take 200 mg by mouth daily.  10/29/16  Yes Hilty, Lisette Abu, MD  Artificial Saliva (BIOTENE DRY MOUTH MOISTURIZING) SOLN 1 spray by Mouth Rinse route every 2 (two) hours as needed (for dryness).   Yes [provider]  bumetanide (BUMEX) 1 MG tablet Take 1 tablet (1 mg total) by mouth 2 (two) times daily. 10/15/16  Yes Hilty, Lisette Abu, MD  cetirizine (ZYRTEC) 10 MG tablet Take 10 mg by mouth daily.    Yes [provider]  Cholecalciferol (VITAMIN D-3) 25 MCG (1000 UT) CAPS Take 1,000 Units by mouth daily.   Yes [provider]  docusate sodium 100 MG CAPS Take 100 mg by mouth 2  (two) times daily. 08/15/13  Yes Babish, Molli Hazard, [name]  famotidine (PEPCID) 20 MG tablet Take 1 tablet (20 mg total) by mouth 2 (two) times daily. 02/21/17  Yes Tyrone Nine, MD  ferrous sulfate (FERROUSUL) 325 (65 FE) MG tablet Take 1 tablet (325 mg total) by mouth 3 (three) times daily with meals. Patient taking differently: Take 325 mg by mouth 2 (two) times daily with a meal.  01/24/17  Yes Babish, Molli Hazard, [name]  gabapentin (NEURONTIN) 400 MG capsule TAKE 1 CAPSULE BY MOUTH THREE TIMES DAILY Patient taking differently: Take 400 mg by mouth 3 (three) times daily.  01/14/17  Yes [name], MD  ipratropium-albuterol (DUONEB) 0.5-2.5 (3) MG/3ML SOLN Take 3 mLs by nebulization every 4 (four) hours as needed (wheezing).  03/14/19  Yes [provider]  KLOR-CON M20 20 MEQ tablet Take 20 mEq by mouth daily. 11/11/18  Yes [provider]  midodrine (PROAMATINE) 10 MG tablet Take 1 tablet (10 mg total) by mouth 3 (three) times daily. 05/28/17  Yes Tyrone Nine, MD  Multiple Vitamins-Minerals (CERTAGEN PO) Take 1 tablet by mouth daily.    Yes [provider]  Multiple Vitamins-Minerals (PRESERVISION AREDS 2) CAPS Take 1 capsule by mouth 2 (two) times daily.   Yes [provider]  nutrition supplement, JUVEN, (JUVEN) PACK Take 1 packet by mouth 2 (two) times daily between meals. 02/21/17  Yes Tyrone Nine, MD  Nutritional Supplements (RESOURCE 2.0) LIQD Take 120 mLs by mouth at bedtime.   Yes  [provider]  OXYGEN Inhale 2 L/min into the lungs as needed (for shortness of breath).    Yes [provider]  polyethylene glycol (MIRALAX / GLYCOLAX) packet Take 17 g by mouth 2 (two) times daily. Patient taking differently: Take 17 g by mouth See admin instructions. Mix 17 grams into 4-8 ounces of liquid and drink two times a day 01/24/17  Yes Babish, Rodman Key, [name]  PRESCRIPTION MEDICATION BiPAP: At bedtime   Yes [provider]  rivaroxaban  (XARELTO) 20 MG TABS tablet Take 1 tablet (20 mg total) by mouth daily. 08/05/15  Yes Hilty, Nadean Corwin, MD  rosuvastatin (CRESTOR) 20 MG tablet TAKE ONE TABLET BY MOUTH ONCE A WEEK, FRIDAY Patient taking differently: Take 20 mg by mouth every Friday.  12/24/16  Yes Hilty, Nadean Corwin, MD  simethicone (MYLICON) 510 MG chewable tablet Chew 125 mg by mouth 3 (three) times daily. FOR 5 DAYS 2019/03/22 03/26/19 Yes [provider]  SPIRIVA HANDIHALER 18 MCG inhalation capsule INHALE ONE PUFF BY MOUTH ONCE DAILY Patient taking differently: Place 18 mcg into inhaler and inhale daily.  03/18/16  Yes Marletta Lor, MD  tamsulosin (FLOMAX) 0.4 MG CAPS capsule TAKE 1 CAPSULE BY MOUTH ONCE DAILY Patient taking differently: Take 0.4 mg by mouth daily.  12/24/16  Yes Marletta Lor, MD  traMADol (ULTRAM) 50 MG tablet Take 1-2 tablets (50-100 mg total) by mouth every 12 (twelve) hours as needed for moderate pain or severe pain (and before hydrotherapy). Patient taking differently: Take 50 mg by mouth at bedtime.  05/28/17  Yes Patrecia Pour, MD  protein supplement shake (PREMIER PROTEIN) LIQD Take 325 mLs (11 oz total) by mouth daily. Patient not taking: Reported on 03/22/2019 02/21/17   Patrecia Pour, MD     Vital Signs: BP (!) 141/62 (BP Location: Right Arm)   Pulse 64   Temp 98.1 F (36.7 C) (Oral)   Resp 18   Ht 5\' 10"  (1.778 m)   Wt 227 lb (103 kg)   SpO2 99%   BMI 32.57 kg/m   Physical Exam Vitals signs and nursing note reviewed.  Constitutional:      Appearance: He is ill-appearing.  Cardiovascular:     Rate and Rhythm: Normal rate.  Pulmonary:     Comments: On BiPAP Abdominal:     Comments: (+) drain labeled #1 - suction in tact, scant opaque white drainage in  (+) drain labeled #2 - suction in tact,   Genitourinary:    Comments: (+) Left PCN to gravity with ~50 cc dark yellow urine, some scant blood at top of drainage bag Neurological:     Mental Status: He is alert.      Imaging: Dg Chest Port 1 View  Result Date: 04/10/2019 CLINICAL DATA:  Shortness of breath. EXAM: PORTABLE CHEST 1 VIEW COMPARISON:  April 06, 2019 FINDINGS: The heart size remains enlarged. Aortic calcifications are noted. There is a large left-sided pleural effusion. There is a small to moderate size right-sided pleural effusion, both of which have worsened since the prior study. There is volume overload with pulmonary edema. There is a persistent left lower lobe airspace opacity. There is atelectasis at the right lung base. There appears to be increased size of the thoracic aorta, specifically the aortic arch and proximal descending aorta. IMPRESSION: 1. Moderate to large bilateral pleural effusions, left greater than right. These have both increased in size from the prior study. 2. Vascular congestion with evidence for developing  pulmonary edema. 3. Persistent left lower lung zone airspace opacity concerning for pneumonia. 4. Right basilar airspace opacity favored to represent atelectasis. 5. Findings concerning for a thoracic aortic aneurysm which may have increased in size from prior chest x-ray in 2019. Consider further evaluation with a contrast enhanced CT of the chest. Electronically Signed   By: Katherine Mantle M.D.   On: 04/10/2019 19:33    Labs:  CBC: Recent Labs    04/08/19 0355 04/09/19 0427 04/10/19 0514 04/10/19 1953  WBC 5.6 5.3 6.5 6.7  HGB 8.4* 8.5* 8.8* 8.6*  HCT 28.5* 28.7* 30.3* 29.7*  PLT 267 275 259 266    COAGS: Recent Labs    03/22/19 0759 03/24/19 1241  INR 1.1  --   APTT  --  36    BMP: Recent Labs    03/30/19 0451 03/31/19 0521 04/07/19 0353 04/10/19 1953  NA 139 139 143 147*  K 4.3 4.4 4.0 3.4*  CL 96* 93* 100 101  CO2 37* 37* 37* 39*  GLUCOSE 108* 107* 106* 121*  BUN 19 21 19 15   CALCIUM 9.0 9.3 9.2 8.9  CREATININE 0.55* 0.61 0.51* 0.57*  GFRNONAA >60 >60 >60 >60  GFRAA >60 >60 >60 >60    LIVER FUNCTION TESTS: Recent Labs     2019/04/14 1749 04/10/19 1953  BILITOT 0.4 0.4  AST 19 17  ALT 15 14  ALKPHOS 64 45  PROT 5.6* 6.1*  ALBUMIN 2.0* 2.3*    Assessment and Plan:  85 y/o M s/p left PCN and left perinephric fluid collection drains x 2 seen today for routine drain follow up.   Left PCN previously with blood output which appears to have essentially resolved on my exam today. Repeat CBC pending at time of this note writing.  Drain #1 to suction, per I/O 10 cc this shift - output appears purulent consistent with previous exam.  Drain #2 to suction, per I/O 10 cc this shift - output appears serosanguineous consistent with previous exam.  Patient planned for follow up CT abd/pelvis with contrast today to further evaluate drains. IR will follow up on imaging and discuss further drain plans.   Please call with questions or concerns.   Electronically Signed: Villa Herb, [name] 04/11/2019, 1:33 PM   I spent a total of 15 Minutes at the the patient's bedside AND on the patient's hospital floor or unit, greater than 50% of which was counseling/coordinating care for follow up left PCN + left perinephric drain x 2.

## 2019-04-11 NOTE — Progress Notes (Signed)
Palliative Medicine RN Note: Note re-consult order for worsening condition since PMT NP Kasie saw [name] Daily yesterday afternoon and inability to reach palliative provider. [name] is off-service today. One of the other providers on our team will follow up with [name] and his family, likely tomorrow.  [name] [name] Bushee, RN, BSN, [facility] Palliative Medicine Team 04/11/2019 2:43 PM Office [phone]

## 2019-04-11 NOTE — Progress Notes (Signed)
Patient is unable to tolerate laying on side to measure wound dimensions however the wound is cleansed and a new foam dressing is applied.

## 2019-04-11 NOTE — Progress Notes (Signed)
Patient was breathing at a respiration rate of 40 with respiratory therapist. However with coach breathing with the respiratory therapist, he managed to be at a respiratory rate of 30. Patient is currently back on continous BIPAP 96 O2 with a respiration rate of 22.

## 2019-04-11 NOTE — Plan of Care (Signed)

## 2019-04-11 NOTE — Progress Notes (Signed)
RT sent pt ABG to lab at 1048.

## 2019-04-11 NOTE — Progress Notes (Addendum)
PROGRESS NOTE  [name] ZOX:[id number] DOB: March 28, 1927 DOA: 03/30/2019 PCP: System, Pcp Not In  HPI/Recap of past 24 hours: 85 y.o.malewith medical history significant foratrial fibrillation on Xarelto, chronic diastolic CHF, asthma/COPD, OSA on BiPAP, hypotension on midodrine, and chronic sacral ulcer, who was brought to the emergency department for evaluation of progressive abdominal distention with alternating constipation and watery stool. Found to have perinephric fluid collection requiring nephrostomy tube. Patient is from long-term nursing home.  He used to walk with a walker and not done so for at least 6 months now.  Mostly wheelchair-bound. Underwent multiple procedures , percutaneous nephrostomy tube on 11/5, 11/11, 04/02/2019 with radiology. Remains in the hospital to complete treatment.  11/24:  Seen by interventional radiology, appreciate IR expertise. Respiratory distress noted later around 1830. Daughter in the room. She made change in code status. He is now DNR per his daughter [name]. No CPR or intubation. Ok to use BIPAP. PH 7.2 with PCO2 107. Evidence of aspiration on bedside suctioning with food content retracted. Personally reviewed CXR done at bedside which shows pulmonary edema, cardiomegaly, and b/l pleural effusions. Received 1 dose IV lasix 20 mg once, judiciously, due to soft BPs. Daughter in the room. She states she would like to see him hold on until Thanksgiving.  04/11/19: Patient was seen and examined at his bedside.  He is on BiPAP.  His daughter in the room.  He responds to questions appropriately.  Last PCO2 of 82 on 04/10/19.  Repeat ABG this morning.  Will obtain CT chest for hypoxia, abdomen and pelvis to assess his abdominal drains x2 and PCN.  Assessment/Plan: Principal Problem:   Perinephric fluid collection Active Problems:   Asthma   Paroxysmal Atrial fibrillation - admitted with RVR   Chronic diastolic CHF (congestive heart failure) (HCC)  AF (paroxysmal atrial fibrillation) (HCC)   Hypokalemia   Ileus (HCC)   Closed compression fracture of L4 vertebra (HCC)   Sacral pressure ulcer   Renal cyst, left   Perinephric abscess   Advanced care planning/counseling discussion   Goals of care, counseling/discussion   Palliative care by specialist  Abdominal pain secondary to perinephric fluid collection/left-sided hydronephrosis with urinoma/complicatedby  Proteus mirabilis UTI post percutaneous nephrostomy tube placement on 03/22/2019 and 04/03/2019. 03/22/2019, superior pole nephrostomy tube placed. 03/28/2019, inferior pole nephrostomy tube placed due to collection not drained. 04/02/2019, inadequate drainage and persistent collection, new nephrostomy tube.  Initial urine culture with Proteus.  Blood cultures negative.   Left PCN and left perinephric drains x 2 Currently on IV Flagyl and Rocephin.   Interventional radiology following. Appreciate IR expertise. C/w pain management and continue to closely monitor drain output Obtain CT abdomen and pelvis on 04/11/2019 to reassess drains x2 and percutaneous nephrostomy.  Acute toxic encephalopathy secondary to CO2 narcosis Respiratory distress noted later around 1830  PH 7.2 with PCO2 107 on 04/10/2019 associated with lethargy Improved with BiPAP>>Repeat ABG done on 04/10/2019 while on BiPAP showed pH 7.3 and PCO2 of 82.  Repeat ABG on 04/11/2019. High risk for recurrence  Gross hematuria likely secondary to coagulopathy Currently on heparin drip for CVA prevention due to chronic A. Fib Continue heparin drip until no further procedures planned When no planned procedures we will switch back to Xarelto Hemoglobin is stable 8.5 on 04/09/2019 from 8.4 on 04/08/2019 from 8.5 on 04/07/2019.  Hg 8.8 on 11/24. MCV 107.  Worsening acute on chronic hypoxic hypercarbic respiratory failure with concern for aspiration ABG from 04/10/2019 showed pH  7.2 and PCO2 of 107.  Repeat ABG done on  04/10/2019 while on BiPAP showed pH 7.3 and PCO2 of 82.  Repeat ABG on 04/11/2019. C/w bronchodialators and nebs NPO while on BIPAP Continue aspiration precautions Reviewed x-ray done on 04/10/2019 which shows pulmonary edema, cardiomegaly and bilateral pleural effusions. He is currently on broad-spectrum IV antibiotics and IV Flagyl, Rocephin>> procalcitonin less than 0.10, lactic acid 0.6 Continue BiPAP at night  Hypokalemia K+ 3.4, Mg2+ 2.2 Repleted with IV KCL- 10 meq x 2 doses  Chronic CO2 retention secondary to OSA and suspected obesity hypoventilation syndrome. Continue BiPAP at night and during naps>> n.p.o. while on BiPAP to avoid aspiration.  COPD Continue bronchodilators Continue to maintain O2 saturation greater than 90%  Chronic A. Fib Currently rate controlled On amiodarone for rhythm control On heparin drip for CVA prevention  Polyneuropathy Continue gabapentin  Hypotension: Blood pressures fairly stable.  Patient on midodrine, continue.  Chronic sacral pressure ulcer stage IV present on admission: Followed by wound care. Stable.  Management per wound care specialist recommendation.  Obstructive sleep apnea: Patient is on BiPAP.  Uses BiPAP at night. Remains hypercarbic  Physical debility/ambulatory dysfunction Continue PT OT with assistance and fall precautions PT recommended SNF CSW assisting with placement.  Goals of care Palliative care team following   Patient will ultimately need ureteric stent by IR once drains are dry, he needs to stay on heparin drip, will stay in the hospital.  DVT prophylaxis: Heparin drip Code Status: Partial Family Communication:  Updated daughter in person on 04/11/2019.  Disposition Plan:  Will discharge to SNF when hemodynamically stable and IR signs off.  Consultants:   Urology  Interventional radiology  Palliative care team  Procedures:   Percutaneous drain, 11/5, 11/11 left kidney  Percutaneous  drain, nephrostomy 04/02/2019  Antimicrobials:   Rocephin, 2019-04-03---  IV Flagyl 04/06/2019---     Objective: Vitals:   04/11/19 0406 04/11/19 0811 04/11/19 0814 04/11/19 0944  BP: (!) 116/55   (!) 115/55  Pulse: 65  61 70  Resp: 20  (!) 24 20  Temp: 98 F (36.7 C)   98.1 F (36.7 C)  TempSrc: Oral   Oral  SpO2:  92% 92% 93%  Weight:      Height:        Intake/Output Summary (Last 24 hours) at 04/11/2019 1024 Last data filed at 04/11/2019 0934 Gross per 24 hour  Intake 2316.8 ml  Output 1545 ml  Net 771.8 ml   Filed Weights   04/09/19 0524 04/10/19 0424 04/11/19 0402  Weight: 100.7 kg 103.9 kg 103 kg    Exam:   General: 85 y.o. year-old male obese in no acute distress.  On BiPAP.  Interactive and answers questions appropriately.  Cardiovascular: Irregular rate and rhythm no rubs or gallops.  Respiratory: Very mild rales at bases no wheezing noted.  Abdomen: Obese bowel sounds present.  3 to left lateral abdominal quadrant.    Musculoskeletal: 2+ pitting edema in lower extremities bilaterally.[name]  Psychiatry: Mood is appropriate for condition and setting.  Data Reviewed: CBC: Recent Labs  Lab 04/07/19 0353 04/08/19 0355 04/09/19 0427 04/10/19 0514 04/10/19 1953  WBC 4.8 5.6 5.3 6.5 6.7  NEUTROABS  --   --   --   --  4.9  HGB 8.5* 8.4* 8.5* 8.8* 8.6*  HCT 28.4* 28.5* 28.7* 30.3* 29.7*  MCV 105.6* 105.6* 105.5* 107.8* 106.5*  PLT 281 267 275 259 266   Basic Metabolic Panel: Recent Labs  Lab 04/07/19 0353 04/10/19 1953  NA 143 147*  K 4.0 3.4*  CL 100 101  CO2 37* 39*  GLUCOSE 106* 121*  BUN 19 15  CREATININE 0.51* 0.57*  CALCIUM 9.2 8.9  MG  --  2.2  PHOS  --  3.3   GFR: Estimated Creatinine Clearance: 75.3 mL/min (A) (by C-G formula based on SCr of 0.57 mg/dL (L)). Liver Function Tests: Recent Labs  Lab 04/10/19 1953  AST 17  ALT 14  ALKPHOS 45  BILITOT 0.4  PROT 6.1*  ALBUMIN 2.3*   No results for input(s): LIPASE,  AMYLASE in the last 168 hours. No results for input(s): AMMONIA in the last 168 hours. Coagulation Profile: No results for input(s): INR, PROTIME in the last 168 hours. Cardiac Enzymes: No results for input(s): CKTOTAL, CKMB, CKMBINDEX, TROPONINI in the last 168 hours. BNP (last 3 results) No results for input(s): PROBNP in the last 8760 hours. HbA1C: No results for input(s): HGBA1C in the last 72 hours. CBG: Recent Labs  Lab 04/10/19 1857 04/10/19 2020 04/11/19 0017 04/11/19 0358  GLUCAP 106* 106* 94 90   Lipid Profile: No results for input(s): CHOL, HDL, LDLCALC, TRIG, CHOLHDL, LDLDIRECT in the last 72 hours. Thyroid Function Tests: No results for input(s): TSH, T4TOTAL, FREET4, T3FREE, THYROIDAB in the last 72 hours. Anemia Panel: No results for input(s): VITAMINB12, FOLATE, FERRITIN, TIBC, IRON, RETICCTPCT in the last 72 hours. Urine analysis:    Component Value Date/Time   COLORURINE AMBER (A) 03/29/2019 1117   APPEARANCEUR CLOUDY (A) 03/29/2019 1117   LABSPEC 1.017 03/29/2019 1117   PHURINE 8.0 03/29/2019 1117   GLUCOSEU NEGATIVE 03/29/2019 1117   HGBUR LARGE (A) 03/29/2019 1117   BILIRUBINUR NEGATIVE 03/29/2019 1117   KETONESUR NEGATIVE 03/29/2019 1117   PROTEINUR 100 (A) 03/29/2019 1117   UROBILINOGEN 0.2 08/06/2013 1006   NITRITE NEGATIVE 03/29/2019 1117   LEUKOCYTESUR SMALL (A) 03/29/2019 1117   Sepsis Labs: @LABRCNTIP (procalcitonin:4,lacticidven:4)  ) Recent Results (from the past 240 hour(s))  SARS CORONAVIRUS 2 (TAT 6-24 HRS) Nasopharyngeal Nasopharyngeal Swab     Status: None   Collection Time: 04/01/19 11:55 AM   Specimen: Nasopharyngeal Swab  Result Value Ref Range Status   SARS Coronavirus 2 NEGATIVE NEGATIVE Final    Comment: (NOTE) SARS-CoV-2 target nucleic acids are NOT DETECTED. The SARS-CoV-2 RNA is generally detectable in upper and lower respiratory specimens during the acute phase of infection. Negative results do not preclude SARS-CoV-2  infection, do not rule out co-infections with other pathogens, and should not be used as the sole basis for treatment or other patient management decisions. Negative results must be combined with clinical observations, patient history, and epidemiological information. The expected result is Negative. Fact Sheet for Patients: HairSlick.nohttps://www.fda.gov/media/138098/download Fact Sheet for Healthcare Providers: quierodirigir.comhttps://www.fda.gov/media/138095/download This test is not yet approved or cleared by the [place] FDA and  has been authorized for detection and/or diagnosis of SARS-CoV-2 by FDA under an Emergency Use Authorization (EUA). This EUA will remain  in effect (meaning this test can be used) for the duration of the COVID-19 declaration under Section 56 4(b)(1) of the Act, 21 U.S.C. section 360bbb-3(b)(1), unless the authorization is terminated or revoked sooner. Performed at [facility], [address] [place], [place] [address]       Studies: Dg Chest Port 1 View  Result Date: 04/10/2019 CLINICAL DATA:  Shortness of breath. EXAM: PORTABLE CHEST 1 VIEW COMPARISON:  April 06, 2019 FINDINGS: The heart size remains enlarged. Aortic calcifications  are noted. There is a large left-sided pleural effusion. There is a small to moderate size right-sided pleural effusion, both of which have worsened since the prior study. There is volume overload with pulmonary edema. There is a persistent left lower lobe airspace opacity. There is atelectasis at the right lung base. There appears to be increased size of the thoracic aorta, specifically the aortic arch and proximal descending aorta. IMPRESSION: 1. Moderate to large bilateral pleural effusions, left greater than right. These have both increased in size from the prior study. 2. Vascular congestion with evidence for developing pulmonary edema. 3. Persistent left lower lung zone airspace opacity concerning for pneumonia. 4. Right basilar  airspace opacity favored to represent atelectasis. 5. Findings concerning for a thoracic aortic aneurysm which may have increased in size from prior chest x-ray in 2019. Consider further evaluation with a contrast enhanced CT of the chest. Electronically Signed   By: Constance Holster M.D.   On: 04/10/2019 19:33    Scheduled Meds:  acetylcysteine  4 mL Nebulization BID   albuterol  2.5 mg Nebulization TID   amiodarone  200 mg Oral Daily   famotidine  20 mg Oral BID   feeding supplement (PRO-STAT SUGAR FREE 64)  30 mL Oral BID WC   gabapentin  400 mg Oral TID   midodrine  10 mg Oral TID WC   multivitamin with minerals  1 tablet Oral Daily   nutrition supplement (JUVEN)  1 packet Oral BID BM   Ensure Max Protein  11 oz Oral QHS   sodium chloride flush  3 mL Intravenous Q12H   sodium chloride flush  5 mL Intracatheter Q8H   umeclidinium bromide  1 puff Inhalation Daily    Continuous Infusions:  sodium chloride 250 mL (04/09/19 0713)   cefTRIAXone (ROCEPHIN)  IV 2 g (04/10/19 2126)   heparin 2,000 Units/hr (04/11/19 0415)   metronidazole 500 mg (04/11/19 0934)     LOS: 20 days     Kayleen Memos, MD [facility] Pager [phone]  If 7PM-7AM, please contact night-coverage [email or web address] Password [facility] 04/11/2019, 10:24 AM

## 2019-04-12 ENCOUNTER — Inpatient Hospital Stay (HOSPITAL_COMMUNITY): Payer: Medicare Other

## 2019-04-12 DIAGNOSIS — J90 Pleural effusion, not elsewhere classified: Secondary | ICD-10-CM

## 2019-04-12 DIAGNOSIS — J9622 Acute and chronic respiratory failure with hypercapnia: Secondary | ICD-10-CM

## 2019-04-12 DIAGNOSIS — J9621 Acute and chronic respiratory failure with hypoxia: Secondary | ICD-10-CM

## 2019-04-12 LAB — BRAIN NATRIURETIC PEPTIDE: B Natriuretic Peptide: 698.2 pg/mL — ABNORMAL HIGH (ref 0.0–100.0)

## 2019-04-12 LAB — GRAM STAIN

## 2019-04-12 LAB — GLUCOSE, CAPILLARY
Glucose-Capillary: 106 mg/dL — ABNORMAL HIGH (ref 70–99)
Glucose-Capillary: 112 mg/dL — ABNORMAL HIGH (ref 70–99)
Glucose-Capillary: 126 mg/dL — ABNORMAL HIGH (ref 70–99)
Glucose-Capillary: 146 mg/dL — ABNORMAL HIGH (ref 70–99)
Glucose-Capillary: 94 mg/dL (ref 70–99)

## 2019-04-12 LAB — BASIC METABOLIC PANEL
Anion gap: 9 (ref 5–15)
BUN: 8 mg/dL (ref 8–23)
CO2: 40 mmol/L — ABNORMAL HIGH (ref 22–32)
Calcium: 8.8 mg/dL — ABNORMAL LOW (ref 8.9–10.3)
Chloride: 99 mmol/L (ref 98–111)
Creatinine, Ser: 0.6 mg/dL — ABNORMAL LOW (ref 0.61–1.24)
GFR calc Af Amer: >60 mL/min (ref >60)
GFR calc non Af Amer: >60 mL/min (ref >60)
Glucose, Bld: 125 mg/dL — ABNORMAL HIGH (ref 70–99)
Potassium: 3.1 mmol/L — ABNORMAL LOW (ref 3.5–5.1)
Sodium: 148 mmol/L — ABNORMAL HIGH (ref 135–145)

## 2019-04-12 LAB — CBC
HCT: 31.3 % — ABNORMAL LOW (ref 39.0–52.0)
Hemoglobin: 9.1 g/dL — ABNORMAL LOW (ref 13.0–17.0)
MCH: 30.7 pg (ref 26.0–34.0)
MCHC: 29.1 g/dL — ABNORMAL LOW (ref 30.0–36.0)
MCV: 105.7 fL — ABNORMAL HIGH (ref 80.0–100.0)
Platelets: 259 10*3/uL (ref 150–400)
RBC: 2.96 MIL/uL — ABNORMAL LOW (ref 4.22–5.81)
RDW: 16.3 % — ABNORMAL HIGH (ref 11.5–15.5)
WBC: 7.8 10*3/uL (ref 4.0–10.5)
nRBC: 0 % (ref 0.0–0.2)

## 2019-04-12 LAB — LACTATE DEHYDROGENASE, PLEURAL OR PERITONEAL FLUID: LD, Fluid: 36 U/L — ABNORMAL HIGH (ref 3–23)

## 2019-04-12 LAB — BODY FLUID CELL COUNT WITH DIFFERENTIAL
Eos, Fluid: 0 %
Lymphs, Fluid: 76 %
Monocyte-Macrophage-Serous Fluid: 8 % — ABNORMAL LOW (ref 50–90)
Neutrophil Count, Fluid: 14 % (ref 0–25)
Other Cells, Fluid: 2 %
Total Nucleated Cell Count, Fluid: 360 cu mm (ref 0–1000)

## 2019-04-12 LAB — ALBUMIN, PLEURAL OR PERITONEAL FLUID: Albumin, Fluid: <1 g/dL

## 2019-04-12 LAB — HEPARIN LEVEL (UNFRACTIONATED): Heparin Unfractionated: 0.63 IU/mL (ref 0.30–0.70)

## 2019-04-12 MED ORDER — LORAZEPAM 2 MG/ML IJ SOLN
0.5000 mg | Freq: Once | INTRAMUSCULAR | Status: AC
  Administered 2019-04-12: 0.5 mg via INTRAVENOUS
  Filled 2019-04-12: qty 1

## 2019-04-12 MED ORDER — ALBUMIN HUMAN 25 % IV SOLN
50.0000 g | Freq: Four times a day (QID) | INTRAVENOUS | Status: AC
  Administered 2019-04-12 – 2019-04-13 (×3): 50 g via INTRAVENOUS
  Filled 2019-04-12 (×3): qty 200

## 2019-04-12 MED ORDER — IPRATROPIUM-ALBUTEROL 0.5-2.5 (3) MG/3ML IN SOLN
3.0000 mL | Freq: Four times a day (QID) | RESPIRATORY_TRACT | Status: DC
  Administered 2019-04-12 – 2019-04-13 (×4): 3 mL via RESPIRATORY_TRACT
  Filled 2019-04-12 (×3): qty 3

## 2019-04-12 MED ORDER — METHYLPREDNISOLONE SODIUM SUCC 125 MG IJ SOLR
60.0000 mg | Freq: Three times a day (TID) | INTRAMUSCULAR | Status: DC
  Administered 2019-04-12: 60 mg via INTRAVENOUS
  Filled 2019-04-12: qty 2

## 2019-04-12 MED ORDER — ALBUMIN HUMAN 25 % IV SOLN
50.0000 g | Freq: Four times a day (QID) | INTRAVENOUS | Status: DC
  Administered 2019-04-12 (×2): 50 g via INTRAVENOUS
  Filled 2019-04-12 (×4): qty 200

## 2019-04-12 MED ORDER — POTASSIUM CHLORIDE 10 MEQ/100ML IV SOLN
10.0000 meq | INTRAVENOUS | Status: AC
  Administered 2019-04-12 (×4): 10 meq via INTRAVENOUS
  Filled 2019-04-12 (×3): qty 100

## 2019-04-12 MED ORDER — METHYLPREDNISOLONE SODIUM SUCC 125 MG IJ SOLR
80.0000 mg | Freq: Four times a day (QID) | INTRAMUSCULAR | Status: DC
  Administered 2019-04-12: 80 mg via INTRAVENOUS
  Filled 2019-04-12: qty 2

## 2019-04-12 MED ORDER — FUROSEMIDE 10 MG/ML IJ SOLN
20.0000 mg | Freq: Every day | INTRAMUSCULAR | Status: DC
  Administered 2019-04-12: 20 mg via INTRAVENOUS
  Filled 2019-04-12: qty 2

## 2019-04-12 MED ORDER — METHYLPREDNISOLONE SODIUM SUCC 125 MG IJ SOLR: 60.0000 mg | Freq: Four times a day (QID) | INTRAMUSCULAR | Status: DC

## 2019-04-12 MED ORDER — POTASSIUM CHLORIDE 10 MEQ/100ML IV SOLN
INTRAVENOUS | Status: AC
  Administered 2019-04-12: 10 meq via INTRAVENOUS
  Filled 2019-04-12: qty 100

## 2019-04-12 MED ORDER — IPRATROPIUM-ALBUTEROL 0.5-2.5 (3) MG/3ML IN SOLN: 3.0000 mL | Freq: Four times a day (QID) | RESPIRATORY_TRACT | Status: DC

## 2019-04-12 MED ORDER — FUROSEMIDE 10 MG/ML IJ SOLN: 20.0000 mg | Freq: Two times a day (BID) | INTRAMUSCULAR | Status: DC

## 2019-04-12 MED ORDER — LIDOCAINE HCL (PF) 1 % IJ SOLN
INTRAMUSCULAR | Status: AC
  Filled 2019-04-12: qty 30

## 2019-04-12 MED ORDER — FUROSEMIDE 10 MG/ML IJ SOLN
20.0000 mg | Freq: Three times a day (TID) | INTRAMUSCULAR | Status: DC
  Administered 2019-04-12 – 2019-04-13 (×3): 20 mg via INTRAVENOUS
  Filled 2019-04-12 (×3): qty 2

## 2019-04-12 NOTE — Plan of Care (Signed)
  Problem: Education: Goal: Knowledge of General Education information will improve Description Including pain rating scale, medication(s)/side effects and non-pharmacologic comfort measures Outcome: Progressing   

## 2019-04-12 NOTE — Progress Notes (Addendum)
ANTICOAGULATION CONSULT NOTE  Pharmacy Consult:  Heparin Indication: atrial fibrillation (CHADSVASc = 3)  Patient Measurements: Height: 5\' 10"  (177.8 cm) Weight: 255 lb (115.7 kg) IBW/kg (Calculated) : 73 Heparin Dosing Weight: 92.3kg   Vital Signs: Temp: 98.4 F (36.9 C) (11/26 0443) Temp Source: Oral (11/26 0443) BP: 129/63 (11/26 0443) Pulse Rate: 71 (11/26 0740)  Labs: Recent Labs    04/10/19 1453 04/10/19 1953 04/11/19 0434 04/11/19 1701 04/12/19 0311 04/12/19 0603  HGB  --  8.6*  --  8.7*  --  9.1*  HCT  --  29.7*  --  29.4*  --  31.3*  PLT  --  266  --  264  --  259  HEPARINUNFRC 0.58  --  0.61  --  0.63  --   CREATININE  --  0.57*  --  0.55*  --  0.60*    Estimated Creatinine Clearance: 79.8 mL/min (A) (by C-G formula based on SCr of 0.6 mg/dL (L)).   Assessment: 50 yoM admitted on 04/07/2019 with perinephric fluid collection requiring nephrostomy tube. Pt on Xarelto PTA for hx AFib (CHADSVASc = 3) which has been held with need for procedures and bridged with IV heparin.   Heparin level on higher end of therapeutic 0.63. Hgb stable and improved slightly at 9.1. Plts stable and wnl. RN notes some bruising that is stable in size and some possible hematuria. No other infusion issues noted. Will decrease drip rate slightly given bleed risk with drains and relatively low clot risk.  Goal of Therapy:  Heparin level 0.3-0.7 units/ml Monitor platelets by anticoagulation protocol: Yes   Plan:  -Decrease heparin to 1950 units/hr -Monitor daily HL; QTue/Fri CBC, plt -Monitor for signs/symptoms of bleeding   Richardine Service, PharmD PGY1 Pharmacy Resident Phone: [phone] 04/12/2019  8:02 AM  Please check [email or web address] for unit-specific pharmacy phone numbers.

## 2019-04-12 NOTE — Progress Notes (Signed)
Referring Physician(s): Hall,C/Bell,E  Supervising Physician: Oley Balm  Patient Status:  [facility] - In-pt  Chief Complaint: Left lower abdominal/flank pain, dyspnea   Subjective: Pt s/p left thoracentesis today; 1 liter fluid removed; f/u CXR with no ptx   Allergies: Nsaids, Morphine and related, and Vicodin [hydrocodone-acetaminophen]  Medications: Prior to Admission medications   Medication Sig Start Date End Date Taking? Authorizing Provider  acetaminophen (TYLENOL) 500 MG tablet Take 2 tablets (1,000 mg total) by mouth every 8 (eight) hours. Patient taking differently: Take 1,000 mg by mouth See admin instructions. Take 1,000 mg by mouth every eight hours and do not exceed 4,000 mg/24 hours from all combined sources 01/24/17  Yes Babish, Molli Hazard, [name]  albuterol (PROVENTIL HFA;VENTOLIN HFA) 108 (90 Base) MCG/ACT inhaler Inhale 2 puffs into the lungs every 4 (four) hours as needed for wheezing or shortness of breath.    Yes [provider]  Amino Acids-Protein Hydrolys (FEEDING SUPPLEMENT, PRO-STAT SUGAR FREE 64,) LIQD Take 30 mLs by mouth 3 (three) times daily.    Yes [provider]  amiodarone (PACERONE) 200 MG tablet TAKE ONE TABLET BY MOUTH ONCE DAILY Patient taking differently: Take 200 mg by mouth daily.  10/29/16  Yes Hilty, Lisette Abu, MD  Artificial Saliva (BIOTENE DRY MOUTH MOISTURIZING) SOLN 1 spray by Mouth Rinse route every 2 (two) hours as needed (for dryness).   Yes [provider]  bumetanide (BUMEX) 1 MG tablet Take 1 tablet (1 mg total) by mouth 2 (two) times daily. 10/15/16  Yes Hilty, Lisette Abu, MD  cetirizine (ZYRTEC) 10 MG tablet Take 10 mg by mouth daily.    Yes [provider]  Cholecalciferol (VITAMIN D-3) 25 MCG (1000 UT) CAPS Take 1,000 Units by mouth daily.   Yes [provider]  docusate sodium 100 MG CAPS Take 100 mg by mouth 2 (two) times daily. 08/15/13  Yes Babish, Molli Hazard, [name]  famotidine (PEPCID) 20  MG tablet Take 1 tablet (20 mg total) by mouth 2 (two) times daily. 02/21/17  Yes Tyrone Nine, MD  ferrous sulfate (FERROUSUL) 325 (65 FE) MG tablet Take 1 tablet (325 mg total) by mouth 3 (three) times daily with meals. Patient taking differently: Take 325 mg by mouth 2 (two) times daily with a meal.  01/24/17  Yes Babish, Molli Hazard, [name]  gabapentin (NEURONTIN) 400 MG capsule TAKE 1 CAPSULE BY MOUTH THREE TIMES DAILY Patient taking differently: Take 400 mg by mouth 3 (three) times daily.  01/14/17  Yes [name], MD  ipratropium-albuterol (DUONEB) 0.5-2.5 (3) MG/3ML SOLN Take 3 mLs by nebulization every 4 (four) hours as needed (wheezing).  03/14/19  Yes [provider]  KLOR-CON M20 20 MEQ tablet Take 20 mEq by mouth daily. 11/11/18  Yes [provider]  midodrine (PROAMATINE) 10 MG tablet Take 1 tablet (10 mg total) by mouth 3 (three) times daily. 05/28/17  Yes Tyrone Nine, MD  Multiple Vitamins-Minerals (CERTAGEN PO) Take 1 tablet by mouth daily.    Yes [provider]  Multiple Vitamins-Minerals (PRESERVISION AREDS 2) CAPS Take 1 capsule by mouth 2 (two) times daily.   Yes [provider]  nutrition supplement, JUVEN, (JUVEN) PACK Take 1 packet by mouth 2 (two) times daily between meals. 02/21/17  Yes Tyrone Nine, MD  Nutritional Supplements (RESOURCE 2.0) LIQD Take 120 mLs by mouth at bedtime.   Yes [provider]  OXYGEN Inhale 2 L/min into the lungs as needed (for shortness of breath).  Yes [provider]  polyethylene glycol (MIRALAX / GLYCOLAX) packet Take 17 g by mouth 2 (two) times daily. Patient taking differently: Take 17 g by mouth See admin instructions. Mix 17 grams into 4-8 ounces of liquid and drink two times a day 01/24/17  Yes Babish, Molli HazardMatthew, [name]  PRESCRIPTION MEDICATION BiPAP: At bedtime   Yes [provider]  rivaroxaban (XARELTO) 20 MG TABS tablet Take 1 tablet (20 mg total) by mouth daily. 08/05/15   Yes Hilty, Lisette AbuKenneth C, MD  rosuvastatin (CRESTOR) 20 MG tablet TAKE ONE TABLET BY MOUTH ONCE A WEEK, FRIDAY Patient taking differently: Take 20 mg by mouth every Friday.  12/24/16  Yes Hilty, Lisette AbuKenneth C, MD  simethicone (MYLICON) 125 MG chewable tablet Chew 125 mg by mouth 3 (three) times daily. FOR 5 DAYS 03/18/2019 03/26/19 Yes [provider]  SPIRIVA HANDIHALER 18 MCG inhalation capsule INHALE ONE PUFF BY MOUTH ONCE DAILY Patient taking differently: Place 18 mcg into inhaler and inhale daily.  03/18/16  Yes Gordy SaversKwiatkowski, Peter F, MD  tamsulosin (FLOMAX) 0.4 MG CAPS capsule TAKE 1 CAPSULE BY MOUTH ONCE DAILY Patient taking differently: Take 0.4 mg by mouth daily.  12/24/16  Yes Gordy SaversKwiatkowski, Peter F, MD  traMADol (ULTRAM) 50 MG tablet Take 1-2 tablets (50-100 mg total) by mouth every 12 (twelve) hours as needed for moderate pain or severe pain (and before hydrotherapy). Patient taking differently: Take 50 mg by mouth at bedtime.  05/28/17  Yes Tyrone NineGrunz, Ryan B, MD  protein supplement shake (PREMIER PROTEIN) LIQD Take 325 mLs (11 oz total) by mouth daily. Patient not taking: Reported on 03/23/2019 02/21/17   Tyrone NineGrunz, Ryan B, MD     Vital Signs: BP 136/70 (BP Location: Left Arm)    Pulse 72    Temp 98.4 F (36.9 C) (Oral)    Resp (!) 37    Ht 5\' 10"  (1.778 m)    Wt 255 lb (115.7 kg)    SpO2 100%    BMI 36.59 kg/m   Physical Exam on bipap; left PCN/perinephric drains intact, outputs 90 cc dark bloody fluid from PCN; 910 cc from left ant and 10 cc from left lat drains  Imaging: Ct Chest Wo Contrast  Result Date: 04/11/2019 CLINICAL DATA:  85 year old male with left percutaneous nephrostomy tube and perinephric drain as well as recent respiratory distress concerning for aspiration. Patient initially presented with urosepsis and had percutaneous nephrostomy catheter placed on 03/22/2019 as well as percutaneous drain placements on 03/22/2019 and again on 03/28/2019. All drainage catheters were  unfortunately inadvertently displaced and patient underwent replacement of percutaneous nephrostomy tube and 2 CT-guided drainage catheters on 04/02/2019. EXAM: CT CHEST WITHOUT AND ABDOMEN/PELVIS WITH CONTRAST TECHNIQUE: Multidetector CT imaging of the chest and abdomen was performed following the standard protocol during bolus administration of intravenous contrast. CONTRAST:  100mL OMNIPAQUE IOHEXOL 300 MG/ML  SOLN COMPARISON:  Prior CT abdomen/pelvis 04/02/2019 FINDINGS: CT CHEST FINDINGS Cardiovascular: Limited evaluation in the absence of intravenous contrast. Cardiomegaly. No pericardial effusion. Extensive atherosclerotic calcifications throughout the coronary arteries. The main pulmonary artery is enlarged at 4.3 cm consistent with pulmonary arterial hypertension. No evidence of aortic aneurysm. Mediastinum/Nodes: Unremarkable thyroid gland.  No mediastinal mass. Lungs/Pleura: Moderate left and small right layering pleural effusions. Interlobular septal thickening is present consistent with interstitial edema. Multifocal patchy areas of airspace opacity also present dependently in both lungs. Debris is also visualized within the right lower and middle lobe bronchial trees. Findings are consistent with aspiration. Musculoskeletal: No acute  fracture or aggressive appearing lytic or blastic osseous lesion. CT ABDOMEN FINDINGS Hepatobiliary: No focal liver abnormality is seen. No gallstones, gallbladder wall thickening, or biliary dilatation. Pancreas: Unremarkable. No pancreatic ductal dilatation or surrounding inflammatory changes. Spleen: Normal in size without focal abnormality. Adrenals/Urinary Tract: Normal adrenal glands. Unremarkable appearance of the right kidney. Stable 4.4 cm simple cyst exophytic from the upper pole of the left kidney. A left percutaneous nephrostomy tube is present and remains in good position. Nephrolithiasis is noted dependently within the upper pole collecting system. Persistent  peripherally enhancing fluid collection in the anterior aspect of the renal hilum measuring approximately 1.9 by 4.0 cm. Two perinephric drainage catheters are present and remain in acceptable position within the complicated fluid collection in the anterior perinephric space. This fluid collection is very challenging to measure given the amorphous nature of the fluid collection. Comparing to the prior CT scan from 04/02/2019, the more anterior fluid collection has dramatically decreased in craniocaudal dimension now measuring 3.7 x 4.0 x 2.5 cm compared to 5.4 x 5.8 x 3.1 cm previously. The second component now measures only 2.3 cm compared to 3.8 cm previously. Diffusely thick walled and trabeculated bladder, unchanged. Stomach/Bowel: No evidence of obstruction or focal bowel wall thickening. Vascular/Lymphatic: Extensive atherosclerotic vascular calcifications. No aneurysm or dissection. Other: No evidence of ascites. Fat containing right inguinal hernia. Musculoskeletal: No acute osseous abnormality. Soft tissue thickening and subcutaneous emphysema overlying the sacrum consistent with a sacral decubitus ulcer. Left total hip arthroplasty. Stable L3 compression fracture. IMPRESSION: 1. The left percutaneous nephrostomy tube and 2 percutaneous abscess drainage catheters remain in good position. 2. Persistent but significantly improved perinephric fluid collections as above. 3. Multifocal patchy airspace opacities in the dependent portions of the right greater than left lung with evidence of debris in the right lower and middle lobe bronchi. Findings are consistent with the clinical suspicion of aspiration. 4. Mild pulmonary edema. 5. Moderate left and small right pleural effusions. 6. Cardiomegaly. 7. Extensive coronary and aortic atherosclerotic calcifications. 8. Sacral decubitus ulcer. 9. Stable L3 compression fracture. Electronically Signed   By: Jacqulynn Cadet M.D.   On: 04/11/2019 16:43   Ct Abdomen  Pelvis W Contrast  Result Date: 04/11/2019 CLINICAL DATA:  85 year old male with left percutaneous nephrostomy tube and perinephric drain as well as recent respiratory distress concerning for aspiration. Patient initially presented with urosepsis and had percutaneous nephrostomy catheter placed on 03/22/2019 as well as percutaneous drain placements on 03/22/2019 and again on 03/28/2019. All drainage catheters were unfortunately inadvertently displaced and patient underwent replacement of percutaneous nephrostomy tube and 2 CT-guided drainage catheters on 04/02/2019. EXAM: CT CHEST WITHOUT AND ABDOMEN/PELVIS WITH CONTRAST TECHNIQUE: Multidetector CT imaging of the chest and abdomen was performed following the standard protocol during bolus administration of intravenous contrast. CONTRAST:  124mL OMNIPAQUE IOHEXOL 300 MG/ML  SOLN COMPARISON:  Prior CT abdomen/pelvis 04/02/2019 FINDINGS: CT CHEST FINDINGS Cardiovascular: Limited evaluation in the absence of intravenous contrast. Cardiomegaly. No pericardial effusion. Extensive atherosclerotic calcifications throughout the coronary arteries. The main pulmonary artery is enlarged at 4.3 cm consistent with pulmonary arterial hypertension. No evidence of aortic aneurysm. Mediastinum/Nodes: Unremarkable thyroid gland.  No mediastinal mass. Lungs/Pleura: Moderate left and small right layering pleural effusions. Interlobular septal thickening is present consistent with interstitial edema. Multifocal patchy areas of airspace opacity also present dependently in both lungs. Debris is also visualized within the right lower and middle lobe bronchial trees. Findings are consistent with aspiration. Musculoskeletal: No acute fracture or aggressive appearing  lytic or blastic osseous lesion. CT ABDOMEN FINDINGS Hepatobiliary: No focal liver abnormality is seen. No gallstones, gallbladder wall thickening, or biliary dilatation. Pancreas: Unremarkable. No pancreatic ductal dilatation  or surrounding inflammatory changes. Spleen: Normal in size without focal abnormality. Adrenals/Urinary Tract: Normal adrenal glands. Unremarkable appearance of the right kidney. Stable 4.4 cm simple cyst exophytic from the upper pole of the left kidney. A left percutaneous nephrostomy tube is present and remains in good position. Nephrolithiasis is noted dependently within the upper pole collecting system. Persistent peripherally enhancing fluid collection in the anterior aspect of the renal hilum measuring approximately 1.9 by 4.0 cm. Two perinephric drainage catheters are present and remain in acceptable position within the complicated fluid collection in the anterior perinephric space. This fluid collection is very challenging to measure given the amorphous nature of the fluid collection. Comparing to the prior CT scan from 04/02/2019, the more anterior fluid collection has dramatically decreased in craniocaudal dimension now measuring 3.7 x 4.0 x 2.5 cm compared to 5.4 x 5.8 x 3.1 cm previously. The second component now measures only 2.3 cm compared to 3.8 cm previously. Diffusely thick walled and trabeculated bladder, unchanged. Stomach/Bowel: No evidence of obstruction or focal bowel wall thickening. Vascular/Lymphatic: Extensive atherosclerotic vascular calcifications. No aneurysm or dissection. Other: No evidence of ascites. Fat containing right inguinal hernia. Musculoskeletal: No acute osseous abnormality. Soft tissue thickening and subcutaneous emphysema overlying the sacrum consistent with a sacral decubitus ulcer. Left total hip arthroplasty. Stable L3 compression fracture. IMPRESSION: 1. The left percutaneous nephrostomy tube and 2 percutaneous abscess drainage catheters remain in good position. 2. Persistent but significantly improved perinephric fluid collections as above. 3. Multifocal patchy airspace opacities in the dependent portions of the right greater than left lung with evidence of debris in  the right lower and middle lobe bronchi. Findings are consistent with the clinical suspicion of aspiration. 4. Mild pulmonary edema. 5. Moderate left and small right pleural effusions. 6. Cardiomegaly. 7. Extensive coronary and aortic atherosclerotic calcifications. 8. Sacral decubitus ulcer. 9. Stable L3 compression fracture. Electronically Signed   By: Malachy MoanHeath  McCullough M.D.   On: 04/11/2019 16:43   Dg Chest Port 1 View  Result Date: 04/10/2019 CLINICAL DATA:  Shortness of breath. EXAM: PORTABLE CHEST 1 VIEW COMPARISON:  April 06, 2019 FINDINGS: The heart size remains enlarged. Aortic calcifications are noted. There is a large left-sided pleural effusion. There is a small to moderate size right-sided pleural effusion, both of which have worsened since the prior study. There is volume overload with pulmonary edema. There is a persistent left lower lobe airspace opacity. There is atelectasis at the right lung base. There appears to be increased size of the thoracic aorta, specifically the aortic arch and proximal descending aorta. IMPRESSION: 1. Moderate to large bilateral pleural effusions, left greater than right. These have both increased in size from the prior study. 2. Vascular congestion with evidence for developing pulmonary edema. 3. Persistent left lower lung zone airspace opacity concerning for pneumonia. 4. Right basilar airspace opacity favored to represent atelectasis. 5. Findings concerning for a thoracic aortic aneurysm which may have increased in size from prior chest x-ray in 2019. Consider further evaluation with a contrast enhanced CT of the chest. Electronically Signed   By: Katherine Mantlehristopher  Green M.D.   On: 04/10/2019 19:33   [place] Thoracentesis Asp Pleural Space W/img Guide  Result Date: 04/12/2019 INDICATION: Patient with history CHF, perinephric abscess, bilateral pleural effusions, respiratory failure, COPD; request received for diagnostic and therapeutic left  thoracentesis. EXAM:  ULTRASOUND GUIDED DIAGNOSTIC AND THERAPEUTIC LEFT THORACENTESIS MEDICATIONS: None COMPLICATIONS: None immediate. PROCEDURE: An ultrasound guided thoracentesis was thoroughly discussed with the patient/son and questions answered. The benefits, risks, alternatives and complications were also discussed. The patient understands and wishes to proceed with the procedure. Written consent was obtained. Ultrasound was performed to localize and mark an adequate pocket of fluid in the left chest. The area was then prepped and draped in the normal sterile fashion. 1% Lidocaine was used for local anesthesia. Under ultrasound guidance a 6 Fr Safe-T-Centesis catheter was introduced. Thoracentesis was performed. The catheter was removed and a dressing applied. FINDINGS: A total of approximately 1 liter of amber/blood-tinged fluid was removed. Samples were sent to the laboratory as requested by the clinical team. IMPRESSION: Successful ultrasound guided diagnostic and therapeutic left thoracentesis yielding 1 liter of pleural fluid. Read by: Jeananne Rama, [name] Electronically Signed   By: Corlis Leak M.D.   On: 04/12/2019 11:41    Labs:  CBC: Recent Labs    04/10/19 0514 04/10/19 1953 04/11/19 1701 04/12/19 0603  WBC 6.5 6.7 7.2 7.8  HGB 8.8* 8.6* 8.7* 9.1*  HCT 30.3* 29.7* 29.4* 31.3*  PLT 259 266 264 259    COAGS: Recent Labs    03/22/19 0759 03/24/19 1241  INR 1.1  --   APTT  --  36    BMP: Recent Labs    04/07/19 0353 04/10/19 1953 04/11/19 1701 04/12/19 0603  NA 143 147* 145 148*  K 4.0 3.4* 3.1* 3.1*  CL 100 101 100 99  CO2 37* 39* 40* 40*  GLUCOSE 106* 121* 107* 125*  BUN 19 15 11 8   CALCIUM 9.2 8.9 8.7* 8.8*  CREATININE 0.51* 0.57* 0.55* 0.60*  GFRNONAA >60 >60 >60 >60  GFRAA >60 >60 >60 >60    LIVER FUNCTION TESTS: Recent Labs    03/23/2019 1749 04/10/19 1953  BILITOT 0.4 0.4  AST 19 17  ALT 15 14  ALKPHOS 64 45  PROT 5.6* 6.1*  ALBUMIN 2.0* 2.3*    Assessment and  Plan: Pt with hx left sided urinary obst/hydronephrosis/nephrolithiasis, assoc perinephric abscesses; s/p left PCN/left RP fluid coll drain 03/22/19, left inf perinephric drain 11/11; s/p replacement of left PCN/perinephric abscess drains 11/17 secondary to retraction; on BIPAP; afebrile; WBC nl; hgb 9.1; K 3.1- replace; creat 0.60; f/u CT A/P yesterday reviewed by Dr. Deanne Coffer:  1. The left percutaneous nephrostomy tube and 2 percutaneous abscess drainage catheters remain in good position. 2. Persistent but significantly improved perinephric fluid collections as above. 3. Multifocal patchy airspace opacities in the dependent portions of the right greater than left lung with evidence of debris in the right lower and middle lobe bronchi. Findings are consistent with the clinical suspicion of aspiration. 4. Mild pulmonary edema. 5. Moderate left and small right pleural effusions. 6. Cardiomegaly. 7. Extensive coronary and aortic atherosclerotic calcifications. 8. Sacral decubitus ulcer. 9. Stable L3 compression fracture.  Cont current tx; ongoing d/w palliative care    Electronically Signed: D. Jeananne Rama, [name] 04/12/2019, 12:34 PM   I spent a total of 15 minutes at the the patient's bedside AND on the patient's hospital floor or unit, greater than 50% of which was counseling/coordinating care for left nephrostomy and perinephric abscess drains    Patient ID: [name], male   DOB: 04/22/1927, 85 y.o.   MRN: [id number]

## 2019-04-12 NOTE — Progress Notes (Signed)
PMT provider f/u with patient and family today, 11/26 at 1:30pm.   [place], [place], [place] Palliative Medicine Team  Phone: [phone] Fax: [phone]

## 2019-04-12 NOTE — Progress Notes (Addendum)
PROGRESS NOTE  [name] ZOX:[id number] DOB: 07/26/1926 DOA: 03/31/2019 PCP: System, Pcp Not In  HPI/Recap of past 24 hours: 85 y.o.malewith medical history significant foratrial fibrillation on Xarelto, chronic diastolic CHF, asthma/COPD, OSA on BiPAP, hypotension on midodrine, and chronic sacral ulcer, who was brought to the emergency department for evaluation of progressive abdominal distention with alternating constipation and watery stool. Found to have perinephric fluid collection requiring nephrostomy tube. Patient is from long-term nursing home.  He used to walk with a walker and not done so for at least 6 months now.  Mostly wheelchair-bound. Underwent multiple procedures , percutaneous nephrostomy tube on 11/5, 11/11, 04/02/2019 with radiology. Remains in the hospital to complete treatment.  11/24:  Seen by interventional radiology, appreciate IR expertise. Respiratory distress noted later around 1830. Daughter in the room. She made change in code status. He is now DNR per his daughter [name] CabalMerry. No CPR or intubation. Ok to use BIPAP. PH 7.2 with PCO2 107. Evidence of aspiration on bedside suctioning with food content retracted. Personally reviewed CXR done at bedside which shows pulmonary edema, cardiomegaly, and b/l pleural effusions. Received 1 dose IV lasix 20 mg once, judiciously, due to soft BPs. Daughter in the room. She states she would like to see him hold on until Thanksgiving.  11/25: Patient was seen and examined at his bedside.  He is on BiPAP.  His daughter in the room.  He responds to questions appropriately.  Last PCO2 of 82 on 04/10/19.  Repeat ABG this morning.  Will obtain CT chest for hypoxia, abdomen and pelvis to assess his abdominal drains x2 and PCN.  04/12/19: Patient was seen and examined at his bedside this morning.  His son [name] HuaDavid present in the room.  On BiPAP.  Left thoracentesis completed by interventional radiologist with 1 L of amber/low-density fluid  removed.  Highly appreciate interventional radiology's assistance.  Elevated BNP greater than 600.  TTE ordered to further assess cardiac structure, pending.  Assessment/Plan: Principal Problem:   Perinephric fluid collection Active Problems:   Asthma   Paroxysmal Atrial fibrillation - admitted with RVR   Chronic diastolic CHF (congestive heart failure) (HCC)   AF (paroxysmal atrial fibrillation) (HCC)   Hypokalemia   Ileus (HCC)   Closed compression fracture of L4 vertebra (HCC)   Sacral pressure ulcer   Renal cyst, left   Perinephric abscess   Advanced care planning/counseling discussion   Goals of care, counseling/discussion   Palliative care by specialist  Improving abdominal pain secondary to perinephric fluid collection/left-sided hydronephrosis with urinoma/complicatedby  Proteus mirabilis UTI post percutaneous nephrostomy tube placement on 03/22/2019 and 04/03/2019. 03/22/2019, superior pole nephrostomy tube placed. 03/28/2019, inferior pole nephrostomy tube placed due to collection not drained. 04/02/2019, inadequate drainage and persistent collection, new nephrostomy tube.  Initial urine culture with Proteus.  Blood cultures negative.   Left PCN and left perinephric drains x 2 Currently on IV Flagyl and Rocephin.   Interventional radiology following. Appreciate IR expertise. C/w pain management and continue to closely monitor drain output CT abdomen and pelvis on 04/11/2019 to reassess drains x2 and percutaneous nephrostomy.  All in place.  No new findings.  Resolved acute toxic encephalopathy secondary to CO2 narcosis on BiPAP continuously Respiratory distress noted later around 1830  PH 7.2 with PCO2 107 on 04/10/2019 associated with lethargy Improved with BiPAP>>Repeat ABG done on 04/10/2019 while on BiPAP showed pH 7.3 and PCO2 of 82.  Repeat ABG on 04/11/2019. High risk for recurrence  Acute on chronic  hypoxic hypercarbic respiratory failure secondary to pulmonary  edema and bilateral pleural effusion post left thoracentesis with 1 L of fluid removed on 04/12/2019 by interventional radiology. On IV antibiotics empirically due to for concern for aspiration pneumonia ABG from 04/10/2019 showed pH 7.2 and PCO2 of 107.  Repeat ABG done on 04/10/2019 while on BiPAP showed pH 7.3 and PCO2 of 82.  Repeat ABG on 04/11/2019>> PCO2 86. Continue bronchodialators and nebs NPO while on BIPAP Continue aspiration precautions Continue to maintain O2 saturation greater than 90% Continue broad-spectrum antibiotics Rocephin and Flagyl. Independently reviewed CXR done post L thoracentesis. Left lung aeration much improved.  Bilateral pleural effusion left greater than right post left thoracentesis Management as per above  Chronic diastolic CHF Last 2D echo done on 05/19/2017 showed normal LVEF 60 to 65% with grade 1 diastolic dysfunction On Bumex at home which was held due to hypotension and soft blood pressures>> patient on midodrine 10 mg 3 times daily. BNP elevated greater than 600 We will obtain a TTE on 04/12/2019 to further evaluate cardiac structure. Started IV Lasix 20 mg twice daily Maintaining intravascular volume in order to maintain MAP greater than 65 with IV albumin 50 g every 6 hours x6 doses  Pulmonary artery hypertension Noted on CT chest TTE pending  Chronic hypotension on midodrine Midodrine, oral vasopressor is currently on hold due to n.p.o. status Maintaining blood pressure with IV albumin by expanding volume Started on IV Lasix 20 mg twice daily, maintain MAP greater than 65  Gross hematuria likely secondary to coagulopathy Currently on heparin drip for CVA prevention due to chronic A. Fib Continue heparin drip until no further procedures planned When no planned procedures we will switch back to Xarelto Hemoglobin is stable 9.1 on 04/12/2019 from 8.7 on 04/11/2019.  Refractory hypokalemia Potassium 3.1 with magnesium of 2.2 Repleted with  IV KCl 10 mEq x 5 doses  Chronic CO2 retention secondary to OSA and suspected obesity hypoventilation syndrome. Continue BiPAP at night and during naps>> n.p.o. while on BiPAP to avoid aspiration.  COPD Continue bronchodilators Continue to maintain O2 saturation greater than 90%  Chronic A. Fib Currently rate controlled On amiodarone for rhythm control On heparin drip for CVA prevention  Polyneuropathy Continue gabapentin  Hypotension: Blood pressures fairly stable.  Patient on midodrine, continue.  Chronic sacral pressure ulcer stage IV present on admission: Followed by wound care. Stable.  Management per wound care specialist recommendation.  Obstructive sleep apnea: Patient is on BiPAP.  Uses BiPAP at night. Remains hypercarbic  Physical debility/ambulatory dysfunction Continue PT OT with assistance and fall precautions PT recommended SNF CSW assisting with placement.  Goals of care Palliative care team following Meeting planned with family on 04/12/2019.  Risks: Patient is high risk for an decompensation.  Requires close monitoring.  Palliative care medicine following.   Patient will ultimately need ureteric stent by IR once drains are dry, he needs to stay on heparin drip, will stay in the hospital.  DVT prophylaxis: Heparin drip Code Status: Partial Family Communication:  Updated daughter in person on 04/11/2019.  Disposition Plan:  Will discharge to SNF when hemodynamically stable and IR signs off.  Consultants:   Urology  Interventional radiology  Palliative care team  Procedures:   Percutaneous drain, 11/5, 11/11 left kidney  Percutaneous drain, nephrostomy 04/02/2019  Antimicrobials:   Rocephin, 04/09/2019---  IV Flagyl 04/06/2019---     Objective: Vitals:   04/12/19 0740 04/12/19 0948 04/12/19 0951 04/12/19 1138  BP:   136/70  Pulse: 71 71 70 72  Resp: (!) 30  (!) 22 (!) 37  Temp:      TempSrc:      SpO2: 98% 97% 98% 100%    Weight:      Height:        Intake/Output Summary (Last 24 hours) at 04/12/2019 1206 Last data filed at 04/12/2019 0600 Gross per 24 hour  Intake 1536.75 ml  Output 1860 ml  Net -323.25 ml   Filed Weights   04/10/19 0424 04/11/19 0402 04/12/19 0457  Weight: 103.9 kg 103 kg 115.7 kg    Exam:   General: 85 y.o. year-old male obese on BiPAP.  In no acute distress.  Alert and oriented x3.  Cardiovascular: Irregular rate and rhythm no rubs or gallops.  Respiratory: Mild rales at bases no wheezing noted.  Poor inspiratory effort.    Abdomen: Obese bowel sounds present. 3 tubes to left lateral abdominal quadrant.    Musculoskeletal: Improving 1+ pitting edema in lower extremities bilaterally.  Psychiatry: Mood is appropriate for condition and setting.  Data Reviewed: CBC: Recent Labs  Lab 04/09/19 0427 04/10/19 0514 04/10/19 1953 04/11/19 1701 04/12/19 0603  WBC 5.3 6.5 6.7 7.2 7.8  NEUTROABS  --   --  4.9  --   --   HGB 8.5* 8.8* 8.6* 8.7* 9.1*  HCT 28.7* 30.3* 29.7* 29.4* 31.3*  MCV 105.5* 107.8* 106.5* 106.9* 105.7*  PLT 275 259 266 264 814   Basic Metabolic Panel: Recent Labs  Lab 04/07/19 0353 04/10/19 1953 04/11/19 1701 04/12/19 0603  NA 143 147* 145 148*  K 4.0 3.4* 3.1* 3.1*  CL 100 101 100 99  CO2 37* 39* 40* 40*  GLUCOSE 106* 121* 107* 125*  BUN 19 15 11 8   CREATININE 0.51* 0.57* 0.55* 0.60*  CALCIUM 9.2 8.9 8.7* 8.8*  MG  --  2.2  --   --   PHOS  --  3.3  --   --    GFR: Estimated Creatinine Clearance: 79.8 mL/min (A) (by C-G formula based on SCr of 0.6 mg/dL (L)). Liver Function Tests: Recent Labs  Lab 04/10/19 1953  AST 17  ALT 14  ALKPHOS 45  BILITOT 0.4  PROT 6.1*  ALBUMIN 2.3*   No results for input(s): LIPASE, AMYLASE in the last 168 hours. No results for input(s): AMMONIA in the last 168 hours. Coagulation Profile: No results for input(s): INR, PROTIME in the last 168 hours. Cardiac Enzymes: No results for input(s):  CKTOTAL, CKMB, CKMBINDEX, TROPONINI in the last 168 hours. BNP (last 3 results) No results for input(s): PROBNP in the last 8760 hours. HbA1C: No results for input(s): HGBA1C in the last 72 hours. CBG: Recent Labs  Lab 04/11/19 0358 04/11/19 1215 04/11/19 1633 04/12/19 0044 04/12/19 0351  GLUCAP 90 109* 94 94 112*   Lipid Profile: No results for input(s): CHOL, HDL, LDLCALC, TRIG, CHOLHDL, LDLDIRECT in the last 72 hours. Thyroid Function Tests: No results for input(s): TSH, T4TOTAL, FREET4, T3FREE, THYROIDAB in the last 72 hours. Anemia Panel: No results for input(s): VITAMINB12, FOLATE, FERRITIN, TIBC, IRON, RETICCTPCT in the last 72 hours. Urine analysis:    Component Value Date/Time   COLORURINE AMBER (A) 03/29/2019 1117   APPEARANCEUR CLOUDY (A) 03/29/2019 1117   LABSPEC 1.017 03/29/2019 1117   PHURINE 8.0 03/29/2019 1117   GLUCOSEU NEGATIVE 03/29/2019 1117   HGBUR LARGE (A) 03/29/2019 1117   BILIRUBINUR NEGATIVE 03/29/2019 1117   KETONESUR NEGATIVE 03/29/2019 1117   PROTEINUR 100 (  A) 03/29/2019 1117   UROBILINOGEN 0.2 08/06/2013 1006   NITRITE NEGATIVE 03/29/2019 1117   LEUKOCYTESUR SMALL (A) 03/29/2019 1117   Sepsis Labs: @LABRCNTIP (procalcitonin:4,lacticidven:4)  ) No results found for this or any previous visit (from the past 240 hour(s)).    Studies: Ct Chest Wo Contrast  Result Date: 04/11/2019 CLINICAL DATA:  85 year old male with left percutaneous nephrostomy tube and perinephric drain as well as recent respiratory distress concerning for aspiration. Patient initially presented with urosepsis and had percutaneous nephrostomy catheter placed on 03/22/2019 as well as percutaneous drain placements on 03/22/2019 and again on 03/28/2019. All drainage catheters were unfortunately inadvertently displaced and patient underwent replacement of percutaneous nephrostomy tube and 2 CT-guided drainage catheters on 04/02/2019. EXAM: CT CHEST WITHOUT AND ABDOMEN/PELVIS  WITH CONTRAST TECHNIQUE: Multidetector CT imaging of the chest and abdomen was performed following the standard protocol during bolus administration of intravenous contrast. CONTRAST:  OMNIPAQUE IOHEXOL 300 MG/ML  SOLN COMPARISON:  Prior CT abdomen/pelvis 04/02/2019 FINDINGS: CT CHEST FINDINGS Cardiovascular: Limited evaluation in the absence of intravenous contrast. Cardiomegaly. No pericardial effusion. Extensive atherosclerotic calcifications throughout the coronary arteries. The main pulmonary artery is enlarged at 4.3 cm consistent with pulmonary arterial hypertension. No evidence of aortic aneurysm. Mediastinum/Nodes: Unremarkable thyroid gland.  No mediastinal mass. Lungs/Pleura: Moderate left and small right layering pleural effusions. Interlobular septal thickening is present consistent with interstitial edema. Multifocal patchy areas of airspace opacity also present dependently in both lungs. Debris is also visualized within the right lower and middle lobe bronchial trees. Findings are consistent with aspiration. Musculoskeletal: No acute fracture or aggressive appearing lytic or blastic osseous lesion. CT ABDOMEN FINDINGS Hepatobiliary: No focal liver abnormality is seen. No gallstones, gallbladder wall thickening, or biliary dilatation. Pancreas: Unremarkable. No pancreatic ductal dilatation or surrounding inflammatory changes. Spleen: Normal in size without focal abnormality. Adrenals/Urinary Tract: Normal adrenal glands. Unremarkable appearance of the right kidney. Stable 4.4 cm simple cyst exophytic from the upper pole of the left kidney. A left percutaneous nephrostomy tube is present and remains in good position. Nephrolithiasis is noted dependently within the upper pole collecting system. Persistent peripherally enhancing fluid collection in the anterior aspect of the renal hilum measuring approximately 1.9 by 4.0 cm. Two perinephric drainage catheters are present and remain in acceptable  position within the complicated fluid collection in the anterior perinephric space. This fluid collection is very challenging to measure given the amorphous nature of the fluid collection. Comparing to the prior CT scan from 04/02/2019, the more anterior fluid collection has dramatically decreased in craniocaudal dimension now measuring 3.7 x 4.0 x 2.5 cm compared to 5.4 x 5.8 x 3.1 cm previously. The second component now measures only 2.3 cm compared to 3.8 cm previously. Diffusely thick walled and trabeculated bladder, unchanged. Stomach/Bowel: No evidence of obstruction or focal bowel wall thickening. Vascular/Lymphatic: Extensive atherosclerotic vascular calcifications. No aneurysm or dissection. Other: No evidence of ascites. Fat containing right inguinal hernia. Musculoskeletal: No acute osseous abnormality. Soft tissue thickening and subcutaneous emphysema overlying the sacrum consistent with a sacral decubitus ulcer. Left total hip arthroplasty. Stable L3 compression fracture. IMPRESSION: 1. The left percutaneous nephrostomy tube and 2 percutaneous abscess drainage catheters remain in good position. 2. Persistent but significantly improved perinephric fluid collections as above. 3. Multifocal patchy airspace opacities in the dependent portions of the right greater than left lung with evidence of debris in the right lower and middle lobe bronchi. Findings are consistent with the clinical suspicion of aspiration. 4. Mild pulmonary edema.  5. Moderate left and small right pleural effusions. 6. Cardiomegaly. 7. Extensive coronary and aortic atherosclerotic calcifications. 8. Sacral decubitus ulcer. 9. Stable L3 compression fracture. Electronically Signed   By: Malachy MoanHeath  McCullough M.D.   On: 04/11/2019 16:43   Ct Abdomen Pelvis W Contrast  Result Date: 04/11/2019 CLINICAL DATA:  85 year old male with left percutaneous nephrostomy tube and perinephric drain as well as recent respiratory distress concerning for  aspiration. Patient initially presented with urosepsis and had percutaneous nephrostomy catheter placed on 03/22/2019 as well as percutaneous drain placements on 03/22/2019 and again on 03/28/2019. All drainage catheters were unfortunately inadvertently displaced and patient underwent replacement of percutaneous nephrostomy tube and 2 CT-guided drainage catheters on 04/02/2019. EXAM: CT CHEST WITHOUT AND ABDOMEN/PELVIS WITH CONTRAST TECHNIQUE: Multidetector CT imaging of the chest and abdomen was performed following the standard protocol during bolus administration of intravenous contrast. CONTRAST:  100mL OMNIPAQUE IOHEXOL 300 MG/ML  SOLN COMPARISON:  Prior CT abdomen/pelvis 04/02/2019 FINDINGS: CT CHEST FINDINGS Cardiovascular: Limited evaluation in the absence of intravenous contrast. Cardiomegaly. No pericardial effusion. Extensive atherosclerotic calcifications throughout the coronary arteries. The main pulmonary artery is enlarged at 4.3 cm consistent with pulmonary arterial hypertension. No evidence of aortic aneurysm. Mediastinum/Nodes: Unremarkable thyroid gland.  No mediastinal mass. Lungs/Pleura: Moderate left and small right layering pleural effusions. Interlobular septal thickening is present consistent with interstitial edema. Multifocal patchy areas of airspace opacity also present dependently in both lungs. Debris is also visualized within the right lower and middle lobe bronchial trees. Findings are consistent with aspiration. Musculoskeletal: No acute fracture or aggressive appearing lytic or blastic osseous lesion. CT ABDOMEN FINDINGS Hepatobiliary: No focal liver abnormality is seen. No gallstones, gallbladder wall thickening, or biliary dilatation. Pancreas: Unremarkable. No pancreatic ductal dilatation or surrounding inflammatory changes. Spleen: Normal in size without focal abnormality. Adrenals/Urinary Tract: Normal adrenal glands. Unremarkable appearance of the right kidney. Stable 4.4 cm  simple cyst exophytic from the upper pole of the left kidney. A left percutaneous nephrostomy tube is present and remains in good position. Nephrolithiasis is noted dependently within the upper pole collecting system. Persistent peripherally enhancing fluid collection in the anterior aspect of the renal hilum measuring approximately 1.9 by 4.0 cm. Two perinephric drainage catheters are present and remain in acceptable position within the complicated fluid collection in the anterior perinephric space. This fluid collection is very challenging to measure given the amorphous nature of the fluid collection. Comparing to the prior CT scan from 04/02/2019, the more anterior fluid collection has dramatically decreased in craniocaudal dimension now measuring 3.7 x 4.0 x 2.5 cm compared to 5.4 x 5.8 x 3.1 cm previously. The second component now measures only 2.3 cm compared to 3.8 cm previously. Diffusely thick walled and trabeculated bladder, unchanged. Stomach/Bowel: No evidence of obstruction or focal bowel wall thickening. Vascular/Lymphatic: Extensive atherosclerotic vascular calcifications. No aneurysm or dissection. Other: No evidence of ascites. Fat containing right inguinal hernia. Musculoskeletal: No acute osseous abnormality. Soft tissue thickening and subcutaneous emphysema overlying the sacrum consistent with a sacral decubitus ulcer. Left total hip arthroplasty. Stable L3 compression fracture. IMPRESSION: 1. The left percutaneous nephrostomy tube and 2 percutaneous abscess drainage catheters remain in good position. 2. Persistent but significantly improved perinephric fluid collections as above. 3. Multifocal patchy airspace opacities in the dependent portions of the right greater than left lung with evidence of debris in the right lower and middle lobe bronchi. Findings are consistent with the clinical suspicion of aspiration. 4. Mild pulmonary edema. 5. Moderate left and  small right pleural effusions. 6.  Cardiomegaly. 7. Extensive coronary and aortic atherosclerotic calcifications. 8. Sacral decubitus ulcer. 9. Stable L3 compression fracture. Electronically Signed   By: Malachy Moan M.D.   On: 04/11/2019 16:43    Scheduled Meds:  acetylcysteine  4 mL Nebulization BID   albuterol  2.5 mg Nebulization TID   amiodarone  200 mg Oral Daily   famotidine  20 mg Oral BID   feeding supplement (PRO-STAT SUGAR FREE 64)  30 mL Oral BID WC   furosemide  20 mg Intravenous Q12H   gabapentin  400 mg Oral TID   lidocaine (PF)       midodrine  10 mg Oral TID WC   multivitamin with minerals  1 tablet Oral Daily   nutrition supplement (JUVEN)  1 packet Oral BID BM   Ensure Max Protein  11 oz Oral QHS   sodium chloride flush  10-40 mL Intracatheter Q12H   sodium chloride flush  3 mL Intravenous Q12H   sodium chloride flush  5 mL Intracatheter Q8H   umeclidinium bromide  1 puff Inhalation Daily    Continuous Infusions:  sodium chloride 250 mL (04/09/19 0713)   albumin human Stopped (04/12/19 1147)   cefTRIAXone (ROCEPHIN)  IV 2 g (04/11/19 1830)   dextrose 30 mL/hr at 04/12/19 0148   heparin 1,950 Units/hr (04/12/19 0839)   metronidazole 500 mg (04/12/19 1157)     LOS: 21 days     Darlin Drop, MD [facility] Pager [phone]  If 7PM-7AM, please contact night-coverage [email or web address] Password [facility] 04/12/2019, 12:06 PM

## 2019-04-12 NOTE — Progress Notes (Signed)
Daily Progress Note   Patient Name: [name]       Date: 04/12/2019 DOB: [birth year]  Age: 85 y.o. MRN#: [id number] Attending Physician: Darlin Drop, DO Primary Care Physician: System, Pcp Not In Admit Date: 04/04/19  Reason for Consultation/Follow-up: Establishing goals of care  Subjective: Patient on BiPAP this afternoon but awake, alert, oriented. Denies pain or dyspnea. Minimally engages in conversation because of BiPAP.   GOC:   F/u with patient's son and daughter at bedside [name] and [name]). Discussed course of hospitalization including diagnose, interventions, plan of care. Discussed his unfortunate decline a few nights ago with respiratory distress requiring BiPAP. Reviewed tests and lab results. [name] shares that compared to the night of the event, her father is much more awake, alert, and oriented.   [name] spoke with Dr. Margo Aye this morning and shares the plan for watchful waiting. [name] and [name] are hoping their father will be able to wean off BiPAP, maybe tomorrow. Also plan for pending TEE. Discussed need for SLP evaluation if he can successfully come of BiPAP. Explained high risk for recurrent events secondary to aspiration and/or CO2 build-up. Discussed medications.   [name] and [name] Hua do ask questions about difference between palliative and hospice services. Explained the difference and answered all questions. [name] shares that he read Hard Choices booklet and feels his father may be nearing the need for hospice and that last night, his father mentioned he was "tired." [name] speaks firmly of the plan to continue current medical management and hopes he will wean off of BiPAP and be able to eat. Also hopes that eventually he can return back to [place]. Discussed watchful  waiting but also trying to keep their father at the center of decision making. [name] states "whatever he wants."   Reassured of ongoing palliative support. PMT contact information given.   Length of Stay: 21  Current Medications: Scheduled Meds:  . acetylcysteine  4 mL Nebulization BID  . amiodarone  200 mg Oral Daily  . famotidine  20 mg Oral BID  . feeding supplement (PRO-STAT SUGAR FREE 64)  30 mL Oral BID WC  . furosemide  20 mg Intravenous Q12H  . gabapentin  400 mg Oral TID  . ipratropium-albuterol  3 mL Nebulization Q6H  . lidocaine (PF)      .  midodrine  10 mg Oral TID WC  . multivitamin with minerals  1 tablet Oral Daily  . nutrition supplement (JUVEN)  1 packet Oral BID BM  . Ensure Max Protein  11 oz Oral QHS  . sodium chloride flush  10-40 mL Intracatheter Q12H  . sodium chloride flush  3 mL Intravenous Q12H  . sodium chloride flush  5 mL Intracatheter Q8H  . umeclidinium bromide  1 puff Inhalation Daily    Continuous Infusions: . sodium chloride 250 mL (04/09/19 0713)  . albumin human 50 g (04/12/19 1409)  . cefTRIAXone (ROCEPHIN)  IV 2 g (04/11/19 1830)  . dextrose 30 mL/hr at 04/12/19 0148  . heparin 1,950 Units/hr (04/12/19 0839)  . metronidazole 500 mg (04/12/19 1157)  . potassium chloride      PRN Meds: sodium chloride, acetaminophen **OR** acetaminophen, albuterol, antiseptic oral rinse, fentaNYL (SUBLIMAZE) injection, Melatonin, ondansetron (ZOFRAN) IV, sodium chloride flush  Physical Exam Vitals signs and nursing note reviewed.  Constitutional:      General: He is awake.  HENT:     Head: Normocephalic and atraumatic.  Cardiovascular:     Rate and Rhythm: Rhythm irregularly irregular.  Pulmonary:     Effort: No tachypnea, accessory muscle usage or respiratory distress.     Comments: On bipap Skin:    General: Skin is warm and dry.  Neurological:     Mental Status: He is alert and oriented to person, place, and time.            Vital Signs:  BP 130/69 (BP Location: Left Arm)   Pulse 69   Temp 98.3 F (36.8 C) (Oral)   Resp (!) 22   Ht 5\' 10"  (1.778 m)   Wt 115.7 kg   SpO2 94%   BMI 36.59 kg/m  SpO2: SpO2: 94 % O2 Device: O2 Device: Bi-PAP O2 Flow Rate: O2 Flow Rate (L/min): 15 L/min  Intake/output summary:   Intake/Output Summary (Last 24 hours) at 04/12/2019 1414 Last data filed at 04/12/2019 1345 Gross per 24 hour  Intake 1536.75 ml  Output 3860 ml  Net -2323.25 ml   LBM: Last BM Date: 04/09/19 Baseline Weight: Weight: 94.8 kg Most recent weight: Weight: 115.7 kg       Palliative Assessment/Data: PPS: 30%     Patient Active Problem List   Diagnosis Date Noted  . Perinephric abscess   . Advanced care planning/counseling discussion   . Goals of care, counseling/discussion   . Palliative care by specialist   . Perinephric fluid collection 03/31/2019  . Ileus (HCC) 04/04/2019  . Closed compression fracture of L4 vertebra (HCC) 04/09/2019  . Sacral pressure ulcer 03/20/2019  . Renal cyst, left 03/24/2019  . Acute encephalopathy 05/14/2017  . Permanent atrial fibrillation (HCC) 05/14/2017  . Chronic respiratory failure with hypoxia (HCC) 05/14/2017  . Chronic combined systolic and diastolic CHF (congestive heart failure) (HCC) 05/14/2017  . 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 Infected pressure ulcer 02/07/2017  . Hypokalemia 02/07/2017  . Normocytic anemia 02/07/2017  . AF (paroxysmal atrial fibrillation) (HCC) 01/31/2017  . Polyneuropathy 01/31/2017  . S/P left TKA 01/24/2017  . Non-ischemic cardiomyopathy (HCC) 10/15/2016  . Chronic diastolic CHF (congestive heart failure) (HCC) 05/28/2016  . Orthostatic hypotension 09/20/2015  . Impaired glucose tolerance 11/25/2014  . Bifascicular block 12/18/2013  . Bilateral edema of lower extremity 10/25/2013  . S/P left THA, AA 08/13/2013  . Chronic anticoagulation 12/06/2012  . General weakness 07/05/2012  . Acute diastolic CHF (congestive  heart failure) ([place]) 01/22/2012  . Hypotension 01/18/2012  . Paroxysmal Atrial fibrillation - admitted with RVR 01/18/2012  . DJD (degenerative joint disease), lumbar 01/18/2012  . Hx of colonic polyps 11/02/2011  . BENIGN PROSTATIC HYPERTROPHY 05/13/2010  . SEBORRHEA CAPITIS 02/13/2010  . CHRONIC RHINITIS 06/02/2009  . Hyperlipidemia 11/01/2008  . INSOMNIA, CHRONIC, MILD 01/02/2008  . Essential hypertension 02/06/2007  . Asthma 02/06/2007    Palliative Care Assessment & Plan   Patient Profile: 85 y.o.malewith past medical history of A. Fib, CHF (EF 40%), sacral wound infectionadmitted on 11-30-2020with abdominal pain. Found to have urinoma and abscess d/t renal calculi. Has undergone several procedures through IR for nephrostomy tubes and drain placements. Admission and recovery has been complicated by drains and tubes dislodging and needing to be replaced, as well has likely aspiration pneumonia. He has OSA and has had ongoing hypercapnia requiring Bipap at night which he does not enjoy. 11/20 he was very lethargic and not eating- chest xray showed aspiration pneumonia. Hehowever, has "bounced back" per him and his daughter- eating breakfast and lunch today, alert and oriented. Palliative medicine consulted for [place].  Assessment Perinephric fluid collection/left sided hydronephrosis with urinoma s/p perc tubes Proteus mirabilis UTI Acute on chronic hypoxic and hypercarbic respiratory failure Pulmonary edema Bilateral pleural effusions Aspiration pneumonia Chronic diastolic CHF Chronic hypotension OSA and suspected obesity hypoventilation syndrome Chronic afib Deconditioning  Recommendations/Plan  DNR, otherwise continue FULL scope treatment.  Pending TEE  Watchful waiting. Adult children remain hopeful he will be able to wean off BiPAP and work with SLP. Ultimately, daughter is hopeful he will be able to return to [facility].   Ongoing palliative discussions  pending clinical course. Educated children on difference between outpatient palliative and hospice services.   PMT provider will follow. Family has PMT contact information.   Goals of Care and Additional Recommendations:  Limitations on Scope of Treatment: Full Scope Treatment  Code Status:  DNR  Prognosis:   Poor  Discharge Planning:  To Be Determined  Care plan was discussed with patient, patient's daughter/son Angela Nevin and [name])  Thank you for allowing the Palliative Medicine Team to assist in the care of this patient.   Time In: 1330 Time Out: 1415 Total Time 45 Prolonged Time Billed no      Greater than 50%  of this time was spent counseling and coordinating care related to the above assessment and plan.  Ihor Dow, DNP, FNP-C Palliative Medicine Team  Phone: [phone] Fax: [phone]  Please contact Palliative Medicine Team phone at [phone] for questions and concerns.

## 2019-04-12 NOTE — Progress Notes (Signed)
Pt transported to and from ultrasound for a procedure without any complications. Pt now back in room resting comfortably at this time. RT will continue to monitor pt status.

## 2019-04-12 NOTE — Plan of Care (Signed)

## 2019-04-12 NOTE — Procedures (Signed)
Ultrasound-guided diagnostic and therapeutic left thoracentesis performed yielding 1 liter of amber/blood-tinged fluid. No immediate complications. Follow-up chest x-ray pending. A portion of the fluid was sent to the lab for preordered studies. EBL none.

## 2019-04-12 NOTE — Progress Notes (Signed)
Increased 02 to 70% for lower saturations.

## 2019-04-13 ENCOUNTER — Inpatient Hospital Stay (HOSPITAL_COMMUNITY): Payer: Medicare Other

## 2019-04-13 DIAGNOSIS — R0602 Shortness of breath: Secondary | ICD-10-CM

## 2019-04-13 DIAGNOSIS — J9621 Acute and chronic respiratory failure with hypoxia: Secondary | ICD-10-CM

## 2019-04-13 DIAGNOSIS — J9622 Acute and chronic respiratory failure with hypercapnia: Secondary | ICD-10-CM

## 2019-04-13 DIAGNOSIS — Z7189 Other specified counseling: Secondary | ICD-10-CM

## 2019-04-13 DIAGNOSIS — R0600 Dyspnea, unspecified: Secondary | ICD-10-CM

## 2019-04-13 DIAGNOSIS — I4891 Unspecified atrial fibrillation: Secondary | ICD-10-CM

## 2019-04-13 DIAGNOSIS — F411 Generalized anxiety disorder: Secondary | ICD-10-CM

## 2019-04-13 DIAGNOSIS — R1310 Dysphagia, unspecified: Secondary | ICD-10-CM

## 2019-04-13 DIAGNOSIS — Z9889 Other specified postprocedural states: Secondary | ICD-10-CM

## 2019-04-13 LAB — GLUCOSE, CAPILLARY
Glucose-Capillary: 140 mg/dL — ABNORMAL HIGH (ref 70–99)
Glucose-Capillary: 148 mg/dL — ABNORMAL HIGH (ref 70–99)
Glucose-Capillary: 147 mg/dL — ABNORMAL HIGH (ref 70–99)
Glucose-Capillary: 147 mg/dL — ABNORMAL HIGH (ref 70–99)

## 2019-04-13 LAB — CBC
HCT: 27.3 % — ABNORMAL LOW (ref 39.0–52.0)
HCT: 26.3 % — ABNORMAL LOW (ref 39.0–52.0)
Hemoglobin: 8.2 g/dL — ABNORMAL LOW (ref 13.0–17.0)
Hemoglobin: 7.8 g/dL — ABNORMAL LOW (ref 13.0–17.0)
MCH: 31.4 pg (ref 26.0–34.0)
MCH: 31.3 pg (ref 26.0–34.0)
MCHC: 30 g/dL (ref 30.0–36.0)
MCHC: 29.7 g/dL — ABNORMAL LOW (ref 30.0–36.0)
MCV: 104.6 fL — ABNORMAL HIGH (ref 80.0–100.0)
MCV: 105.6 fL — ABNORMAL HIGH (ref 80.0–100.0)
Platelets: 218 10*3/uL (ref 150–400)
Platelets: 219 10*3/uL (ref 150–400)
RBC: 2.61 MIL/uL — ABNORMAL LOW (ref 4.22–5.81)
RBC: 2.49 MIL/uL — ABNORMAL LOW (ref 4.22–5.81)
RDW: 16.5 % — ABNORMAL HIGH (ref 11.5–15.5)
RDW: 16.8 % — ABNORMAL HIGH (ref 11.5–15.5)
WBC: 9.6 10*3/uL (ref 4.0–10.5)
WBC: 8.6 10*3/uL (ref 4.0–10.5)
nRBC: 0 % (ref 0.0–0.2)
nRBC: 0 % (ref 0.0–0.2)

## 2019-04-13 LAB — BLOOD GAS, ARTERIAL
Acid-Base Excess: 18.2 mmol/L — ABNORMAL HIGH (ref 0.0–2.0)
Bicarbonate: 44.9 mmol/L — ABNORMAL HIGH (ref 20.0–28.0)
Drawn by: 51133
FIO2: 70
O2 Saturation: 94 %
Patient temperature: 36.9
pCO2 arterial: 86.2 mmHg (ref 32.0–48.0)
pH, Arterial: 7.336 — ABNORMAL LOW (ref 7.350–7.450)
pO2, Arterial: 71.4 mmHg — ABNORMAL LOW (ref 83.0–108.0)

## 2019-04-13 LAB — LACTIC ACID, PLASMA: Lactic Acid, Venous: 1 mmol/L (ref 0.5–1.9)

## 2019-04-13 LAB — BASIC METABOLIC PANEL
Anion gap: 12 (ref 5–15)
BUN: 7 mg/dL — ABNORMAL LOW (ref 8–23)
CO2: 43 mmol/L — ABNORMAL HIGH (ref 22–32)
Calcium: 8.6 mg/dL — ABNORMAL LOW (ref 8.9–10.3)
Chloride: 94 mmol/L — ABNORMAL LOW (ref 98–111)
Creatinine, Ser: 0.6 mg/dL — ABNORMAL LOW (ref 0.61–1.24)
GFR calc Af Amer: >60 mL/min (ref >60)
GFR calc non Af Amer: >60 mL/min (ref >60)
Glucose, Bld: 151 mg/dL — ABNORMAL HIGH (ref 70–99)
Potassium: 3.3 mmol/L — ABNORMAL LOW (ref 3.5–5.1)
Sodium: 149 mmol/L — ABNORMAL HIGH (ref 135–145)

## 2019-04-13 LAB — MAGNESIUM: Magnesium: 2.1 mg/dL (ref 1.7–2.4)

## 2019-04-13 LAB — HEPARIN LEVEL (UNFRACTIONATED): Heparin Unfractionated: 0.46 IU/mL (ref 0.30–0.70)

## 2019-04-13 LAB — PROCALCITONIN: Procalcitonin: 0.11 ng/mL

## 2019-04-13 MED ORDER — POTASSIUM CHLORIDE 10 MEQ/100ML IV SOLN
10.0000 meq | Freq: Once | INTRAVENOUS | Status: AC
  Administered 2019-04-13: 10 meq via INTRAVENOUS
  Filled 2019-04-13: qty 100

## 2019-04-13 MED ORDER — FENTANYL BOLUS VIA INFUSION
50.0000 ug | INTRAVENOUS | Status: DC | PRN
  Filled 2019-04-13: qty 100

## 2019-04-13 MED ORDER — LORAZEPAM 2 MG/ML IJ SOLN
0.5000 mg | Freq: Four times a day (QID) | INTRAMUSCULAR | Status: DC | PRN
  Administered 2019-04-13: 15:00:00 0.5 mg via INTRAVENOUS
  Filled 2019-04-13: qty 1

## 2019-04-13 MED ORDER — FENTANYL CITRATE (PF) 100 MCG/2ML IJ SOLN
100.0000 ug | Freq: Once | INTRAMUSCULAR | Status: AC
  Administered 2019-04-13: 100 ug via INTRAVENOUS
  Filled 2019-04-13: qty 2

## 2019-04-13 MED ORDER — SODIUM CHLORIDE 0.9 % IV SOLN
500.0000 mg | INTRAVENOUS | Status: DC
  Filled 2019-04-13: qty 500

## 2019-04-13 MED ORDER — ALBUTEROL (5 MG/ML) CONTINUOUS INHALATION SOLN
2.5000 mg/h | INHALATION_SOLUTION | RESPIRATORY_TRACT | Status: DC
  Administered 2019-04-13: 11:00:00 2.5 mg/h via RESPIRATORY_TRACT
  Filled 2019-04-13: qty 20

## 2019-04-13 MED ORDER — VANCOMYCIN HCL 10 G IV SOLR
2000.0000 mg | INTRAVENOUS | Status: DC
  Filled 2019-04-13: qty 2000

## 2019-04-13 MED ORDER — LORAZEPAM 2 MG/ML IJ SOLN: 0.5000 mg | INTRAMUSCULAR | Status: DC | PRN

## 2019-04-13 MED ORDER — FENTANYL CITRATE (PF) 100 MCG/2ML IJ SOLN
50.0000 ug | INTRAMUSCULAR | Status: DC | PRN
  Administered 2019-04-13: 13:00:00 100 ug via INTRAVENOUS
  Filled 2019-04-13: qty 2

## 2019-04-13 MED ORDER — FENTANYL CITRATE (PF) 100 MCG/2ML IJ SOLN: 25.0000 ug | Freq: Once | INTRAMUSCULAR | Status: DC

## 2019-04-13 MED ORDER — POTASSIUM CHLORIDE 10 MEQ/100ML IV SOLN
10.0000 meq | INTRAVENOUS | Status: AC
  Administered 2019-04-13 (×3): 10 meq via INTRAVENOUS
  Filled 2019-04-13 (×3): qty 100

## 2019-04-13 MED ORDER — GLYCOPYRROLATE 0.2 MG/ML IJ SOLN
0.2000 mg | INTRAMUSCULAR | Status: DC | PRN
  Filled 2019-04-13: qty 1

## 2019-04-13 MED ORDER — FENTANYL 2500MCG IN NS 250ML (10MCG/ML) PREMIX INFUSION
0.0000 ug/h | INTRAVENOUS | Status: DC
  Administered 2019-04-13: 16:00:00 100 ug/h via INTRAVENOUS
  Filled 2019-04-13: qty 250

## 2019-04-13 MED ORDER — FENTANYL CITRATE (PF) 100 MCG/2ML IJ SOLN
50.0000 ug | Freq: Once | INTRAMUSCULAR | Status: AC
  Administered 2019-04-13: 12:00:00 50 ug via INTRAVENOUS
  Filled 2019-04-13: qty 2

## 2019-04-13 MED ORDER — SODIUM CHLORIDE 0.9 % IV SOLN
1.0000 g | Freq: Three times a day (TID) | INTRAVENOUS | Status: DC
  Filled 2019-04-13 (×3): qty 1

## 2019-04-13 MED ORDER — LORAZEPAM 2 MG/ML IJ SOLN
0.5000 mg | Freq: Once | INTRAMUSCULAR | Status: AC
  Administered 2019-04-13: 15:00:00 0.5 mg via INTRAVENOUS
  Filled 2019-04-13: qty 1

## 2019-04-15 LAB — PH, BODY FLUID: pH, Body Fluid: 7.7

## 2019-04-16 LAB — ACID FAST SMEAR (AFB, MYCOBACTERIA): Acid Fast Smear: NEGATIVE

## 2019-04-16 LAB — CYTOLOGY - NON PAP

## 2019-04-17 LAB — CULTURE, BODY FLUID W GRAM STAIN -BOTTLE: Culture: NO GROWTH

## 2019-04-17 NOTE — Progress Notes (Addendum)
Daily Progress Note   Patient Name: [name]       Date: 04-23-19 DOB: 1926/06/14  Age: 85 y.o. MRN#: [id number] Attending Physician: Kayleen Memos, DO Primary Care Physician: System, Pcp Not In Admit Date: 04/01/2019  Reason for Consultation/Follow-up: Establishing goals of care  Subjective: Patient intermittently awake/alert on BiPAP. Appears uncomfortable this AM with tachypnea and accessory muscle usage. Increase O2 requirements on BiPAP. Reports he is short of breath.   Checked in on patient x4 today.  GOC:  Daughter and son [name] and [name]) at bedside.   Extensive time spent with patient and family today discussing course of hospitalization and goals of care including diagnoses, interventions, plan of care, and poor prognosis. Patient de-sats to mid 70's within minutes of BiPAP removal for oral care.  [name] and [name] acknowledge that he appears uncomfortable and they are ready for shift to comfort measures, understanding poor prognosis and he will likely pass quickly when BiPAP is removed. [name] and [name] Brow do not wish to see their father suffer or struggle at EOL. They understand shift to comfort medications and focus on medications for symptom management. Discussed EOL expectations and prepared them for hospital death.   This NP did explain to patient diagnoses, poor prognosis, and that he will not survive long when BiPAP is removed. Initially, patient tells family and I his wish to continue BiPAP. Shortly after, received call from family stating [name] is ready for BiPAP to be removed. Fentanyl and ativan given prior to removal of BiPAP. After removal, patient still appears slightly uncomfortable with accessory muscle usage. RN to give Fentanyl 168mcg and Ativan 0.5mg  IV now  and start continuous fentanyl infusion when sent from pharmacy.   Patient has told the family twice that 'everyone has to die eventually.' He understands he is nearing EOL and is ready/at peace with this.  Answered all questions and concerns for family. Emotional support provided. Family declined chaplain support.   Length of Stay: 22  Current Medications: Scheduled Meds:   acetylcysteine  4 mL Nebulization BID   famotidine  20 mg Oral BID   feeding supplement (PRO-STAT SUGAR FREE 64)  30 mL Oral BID WC   furosemide  20 mg Intravenous TID   midodrine  10 mg Oral TID WC   sodium chloride flush  10-40 mL Intracatheter Q12H   sodium chloride flush  3 mL Intravenous Q12H   sodium chloride flush  5 mL Intracatheter Q8H   umeclidinium bromide  1 puff Inhalation Daily    Continuous Infusions:  sodium chloride 250 mL (04/09/19 0713)   albuterol 2.5 mg/hr (03/31/2019 1033)   azithromycin     heparin 1,950 Units/hr (04/12/2019 0629)   meropenem (MERREM) IV     metronidazole 500 mg (04/05/2019 0408)   potassium chloride 10 mEq (04/03/2019 1105)   vancomycin      PRN Meds: sodium chloride, acetaminophen **OR** acetaminophen, albuterol, antiseptic oral rinse, fentaNYL (SUBLIMAZE) injection, glycopyrrolate, LORazepam, Melatonin, ondansetron (ZOFRAN) IV, sodium chloride flush  Physical Exam Vitals signs and nursing note reviewed.  Constitutional:      Appearance: He is ill-appearing.  HENT:     Head: Normocephalic and atraumatic.  Cardiovascular:     Rate and Rhythm: Rhythm irregularly irregular.  Pulmonary:     Effort: Tachypnea, accessory muscle usage and respiratory distress present.     Breath sounds: Decreased breath sounds present.     Comments: BiPAP--transitioned to [place] this afternoon. Skin:    General: Skin is warm and dry.     Coloration: Skin is pale.  Neurological:     Mental Status: He is easily aroused.     Comments: Drowsy, oriented when awake.    Psychiatric:        Attention and Perception: He is inattentive.        Speech: Speech is delayed.            Vital Signs: BP (!) 126/59 (BP Location: Left Arm)    Pulse 87    Temp 98.7 F (37.1 C) (Oral)    Resp (!) 38    Ht 5\' 10"  (1.778 m)    Wt 114.6 kg    SpO2 93%    BMI 36.25 kg/m  SpO2: SpO2: 93 % O2 Device: O2 Device: Bi-PAP O2 Flow Rate: O2 Flow Rate (L/min): 15 L/min  Intake/output summary:   Intake/Output Summary (Last 24 hours) at 03/23/2019 1156 Last data filed at 03/24/2019 04/15/2019 Gross per 24 hour  Intake 1371.92 ml  Output 5090 ml  Net -3718.08 ml   LBM: Last BM Date: 04/12/19 Baseline Weight: Weight: 94.8 kg Most recent weight: Weight: 114.6 kg       Palliative Assessment/Data: PPS: 20%     Patient Active Problem List   Diagnosis Date Noted   Pleural effusion    Acute on chronic respiratory failure with hypoxia and hypercapnia (HCC)    Perinephric abscess    Advanced care planning/counseling discussion    Goals of care, counseling/discussion    Palliative care by specialist    Perinephric fluid collection 04/18/2019   Ileus (HCC) 2019-04-18   Closed compression fracture of L4 vertebra (HCC) 04/18/19   Sacral pressure ulcer 2019/04/18   Renal cyst, left April 18, 2019   Acute encephalopathy 05/14/2017   Permanent atrial fibrillation (HCC) 05/14/2017   Chronic respiratory failure with hypoxia (HCC) 05/14/2017   Chronic combined systolic and diastolic CHF (congestive heart failure) (HCC) 05/14/2017   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   Infected pressure ulcer 02/07/2017   Hypokalemia 02/07/2017   Normocytic anemia 02/07/2017   AF (paroxysmal atrial fibrillation) (HCC) 01/31/2017   Polyneuropathy 01/31/2017   S/P left TKA 01/24/2017   Non-ischemic cardiomyopathy (HCC) 10/15/2016   Chronic diastolic CHF (congestive heart failure) (HCC) 05/28/2016   Orthostatic hypotension 09/20/2015   Impaired glucose tolerance  11/25/2014  Bifascicular block 12/18/2013   Bilateral edema of lower extremity 10/25/2013   S/P left THA, AA 08/13/2013   Chronic anticoagulation 12/06/2012   General weakness 07/05/2012   Acute diastolic CHF (congestive heart failure) (HCC) 01/22/2012   Hypotension 01/18/2012   Paroxysmal Atrial fibrillation - admitted with RVR 01/18/2012   DJD (degenerative joint disease), lumbar 01/18/2012   Hx of colonic polyps 11/02/2011   BENIGN PROSTATIC HYPERTROPHY 05/13/2010   SEBORRHEA CAPITIS 02/13/2010   CHRONIC RHINITIS 06/02/2009   Hyperlipidemia 11/01/2008   INSOMNIA, CHRONIC, MILD 01/02/2008   Essential hypertension 02/06/2007   Asthma 02/06/2007    Palliative Care Assessment & Plan   Patient Profile: 85 y.o.malewith past medical history of A. Fib, CHF (EF 40%), sacral wound infectionadmitted on 2020-11-11with abdominal pain. Found to have urinoma and abscess d/t renal calculi. Has undergone several procedures through IR for nephrostomy tubes and drain placements. Admission and recovery has been complicated by drains and tubes dislodging and needing to be replaced, as well has likely aspiration pneumonia. He has OSA and has had ongoing hypercapnia requiring Bipap at night which he does not enjoy. 11/20 he was very lethargic and not eating- chest xray showed aspiration pneumonia. Hehowever, has "bounced back" per him and his daughter- eating breakfast and lunch today, alert and oriented. Palliative medicine consulted for GOC.  Assessment Perinephric fluid collection/left sided hydronephrosis with urinoma s/p perc tubes Proteus mirabilis UTI Acute on chronic hypoxic and hypercarbic respiratory failure Pulmonary edema Bilateral pleural effusions Aspiration pneumonia Chronic diastolic CHF Chronic hypotension OSA and suspected obesity hypoventilation syndrome Chronic afib Deconditioning  Recommendations/Plan  After extensive discussions with family this  afternoon, plan to transition to comfort measures only. Patient is ready for BiPAP to be removed, understanding poor prognosis.   Symptom management  Patient remains uncomfortable despite Fentanyl IV push. RN to initiate Fentanyl infusion 168mcg/hr.  RN may bolus fentanyl via infusion 50-173mcg q52min prn pain/dyspnea/air hunger/tachypnea  Ativan 0.5mg  IV q4h prn anxiety  Robinul 0.2mg  IV q4h prn secretions  Continue frequent oral care.   BiPAP removed and patient actively dying. Anticipate he will pass inpatient.   Family declined chaplain visit.   Unrestricted visitor access.  Code Status:  DNR  Prognosis:   Likely hours  Discharge Planning:  Anticipated Hospital Death  Care plan was discussed with patient, patient's daughter/son [name] and [name] Hua), RN, Dr. Margo Aye  Thank you for allowing the Palliative Medicine Team to assist in the care of this patient.   Time In: 1045- 1300- 1445- Time Out: 1145 1320 1515 Total Time 110 Prolonged Time Billed yes      Greater than 50%  of this time was spent counseling and coordinating care related to the above assessment and plan.  Vennie Homans, DNP, FNP-C Palliative Medicine Team  Phone: [phone] Fax: [phone]  Please contact Palliative Medicine Team phone at [phone] for questions and concerns.

## 2019-04-17 NOTE — Progress Notes (Signed)
Call placed to [facility], pt is not a candidate for donation.

## 2019-04-17 NOTE — Progress Notes (Signed)
PT Cancellation Note  Patient Details Name: [name] MRN: [id number] DOB: [birth year]   Cancelled Treatment:    Reason Eval/Treat Not Completed: Medical issues which prohibited therapy  Per chart review, patient remains on continuous BiPAP and on evening of 11/26 had respiratory event with RR into 40-50s with BiPAP on max settings. Discussed case with RN, who recommends holding PT today. Will follow acutely.    Windell Norfolk, DPT, PN1   Supplemental Physical Therapist [facility]    Pager [phone] Acute Rehab Office [phone]

## 2019-04-17 NOTE — Progress Notes (Signed)
ANTICOAGULATION CONSULT NOTE  Pharmacy Consult:  Heparin Indication: atrial fibrillation (CHADSVASc = 3)  Patient Measurements: Height: 5\' 10"  (177.8 cm) Weight: 252 lb 10.4 oz (114.6 kg) IBW/kg (Calculated) : 73 Heparin Dosing Weight: 113kg   Vital Signs: Temp: 98.4 F (36.9 C) (11/27 0729) Temp Source: Oral (11/27 0729) BP: 143/68 (11/27 0729) Pulse Rate: 76 (11/27 0756)  Labs: Recent Labs    04/11/19 0434 04/11/19 1701 04/12/19 0311 04/12/19 0603 04/02/2019 0404  HGB  --  8.7*  --  9.1* 8.2*  HCT  --  29.4*  --  31.3* 27.3*  PLT  --  264  --  259 218  HEPARINUNFRC 0.61  --  0.63  --  0.46  CREATININE  --  0.55*  --  0.60* 0.60*    Estimated Creatinine Clearance: 79.3 mL/min (A) (by C-G formula based on SCr of 0.6 mg/dL (L)).   Assessment: 49 yoM admitted on April 16, 2019 with perinephric fluid collection requiring nephrostomy tube. Pt on Xarelto PTA for hx AFib (CHADSVASc = 3) which has been held with need for procedures and bridged with IV heparin.   Heparin level 0.46. H/H & plt stable   Goal of Therapy:  Heparin level 0.3-0.7 units/ml Monitor platelets by anticoagulation protocol: Yes   Plan:  -Continue heparin 1950 units/hr -Monitor daily HL; QTue/Fri CBC, plt -Monitor for signs/symptoms of bleeding   Benetta Spar, PharmD, BCPS, BCCP Clinical Pharmacist  Please check AMION for all [place] phone numbers After 10:00 PM, call [place]

## 2019-04-17 NOTE — Consult Note (Signed)
NAME:  [name] E Punch, MRN:  [id number], DOB:  04-Apr-1927, LOS: 22 ADMISSION DATE:  03/29/2019, CONSULTATION DATE:  04/04/2019 REFERRING MD: Margo AyeHall, CHIEF COMPLAINT:  Acute on  chronic respiratory failure, recurrent aspiration, Pleural effusion, heart failure  All history retrieved from Hospital records as patient unable  Brief History   85 year old male with medical history significant foratrial fibrillation on Xarelto, chronic diastolic CHF, asthma/COPD, OSA on BiPAP, hypotension on midodrine, and chronic sacral ulcer. He was brought to the ED 03/22/2019 and was found to have left sided urinary obstruction associated with a perinephric abscesses.He has undergone  multiple interventions to treat this abscess.  Today is Day 22 of hospitalization.[name].  PCCM have been consulted for worsening chronic respiratory failure in setting of aspiration pneumonia, worsening CHF with pleural effusion.   History of present illness   85 y.o.malewith medical history significant foratrial fibrillation on Xarelto, chronic diastolic CHF, asthma/COPD, OSA on BiPAP, hypotension on midodrine, and chronic sacral ulcer, who was brought to the emergency department for evaluation of progressive abdominal distention with alternating constipation and watery stool. Found to have perinephric fluid collection requiring nephrostomy tube. Patient is from long-term nursing home. He used to walk with a walker and not done so for at least 6 months now. Mostly wheelchair-bound. Underwent multiple procedures for left sided urinary obstruction , percutaneous nephrostomy tube on 11/5, 11/11, 04/02/2019 with interventional radiology.He had Thoracentesis 04/12/2019 with removal of 1L of pleural fluid. PCCM have been consulted in setting of worsening chronic hypercarbic respiratory failure in setting of aspiration pneumonia, worsening CHF with pleural effusion, and L perinephric abscess.   Past Medical History   Past Medical History:   Diagnosis Date  . A-fib (HCC)   . Acute systolic CHF (congestive heart failure), NYHA class 3 -- Unclear etilogy (? Afib related) EF down from 60-70% to 40%. 07/05/2012   Echo 2/21: LV upper limits of normal. EF- 40%. Cannot assess Diastolic function - Aortic Sclerosis, Mod-Severe Left Atrial dilation; Mild RV & RA dilation; Moderately elevated PA peak pressure: 43mm Hg     . Arthritis    knees  . Asthma   . ASTHMA 02/06/2007   Qualifier: Diagnosis of  By: Claiborne Billingsallahan CMA, Terance IceJacqualynn    . BENIGN PROSTATIC HYPERTROPHY 05/13/2010   Qualifier: Diagnosis of  By: Amador CunasKwiatkowski  MD, Janett LabellaPeter F   . Bifascicular block 12/18/2013  . BPH (benign prostatic hypertrophy)   . Bronchospasm, exercise-induced   . CHF (congestive heart failure) (HCC)    systolic, EF 40% (07/07/2012)  . Chronic kidney disease    stabilized, due to infection  . Chronic rhinitis 06/02/2009   Qualifier: Diagnosis of  By: Lovell SheehanJenkins MD, Balinda QuailsJohn E   . DJD (degenerative joint disease), lumbar 01/18/2012  . Dyslipidemia   . Edema    Bilateral - left lower leg greater than right- wears compression stockings  . General weakness 07/05/2012  . GERD (gastroesophageal reflux disease)   . History of elevated glucose 10/01/2009   Qualifier: Diagnosis of  By: Lovell SheehanJenkins MD, Balinda QuailsJohn E   . Hx of colonic polyps 11/02/2011  . Hypertension   . INSOMNIA, CHRONIC, MILD 01/02/2008   Qualifier: Diagnosis of  By: Lovell SheehanJenkins MD, Balinda QuailsJohn E   . Macular degeneration 08-06-13   bilateral -sight impaired  . Obesity   . Paroxysmal Atrial fibrillation - admitted with RVR 01/18/2012  . S/P left THA, AA 08/13/2013  . Shortness of breath     Significant Hospital Events   03/22/2019 Admission to  [facility]  Consults:  11/27 CCM Cards  Procedures:  03/22/2019  Placement of left PCN/left RP fluid coll drain ( IR) 03/28/2019 Placement of left inf perinephric drain (IR) 11/17 Replacement of  left PCN/perinephric abscess drains 11/17 secondary to retraction(IR) 11/26 Thoracentesis>> 1000 cc  Pleural fluid removed ( IR)  Significant Diagnostic Tests:  CT Chest 04/11/2019 Multifocal patchy airspace opacities in the dependent portions of the right greater than left lung with evidence of debris in the right lower and middle lobe bronchi. Findings are consistent with the clinical suspicion of aspiration. Mild pulmonary edema. Moderate left and small right pleural effusions.  CXR 04/11/2019 Diffuse bilateral airspace opacities with substantial interval worsening on the left which may reflect pneumonia or edema. Small bilateral pleural effusions. Unchanged cardiomegaly  Micro Data:  11/27 BCx pend 11/26 pleural fluid pend 11/15 covid neg  11/13 UCx Neg 11/11 abscess/perinephric drain + Proteus (S- cefaz) 11/5 L retroperitoneal abscess cx +Proteus (R: amp, unasyn, cipro, bactrim) 11/5 mrsa pcr - neg 11/5 UCx - 100 colonies proteus (R: see above) 11/4 covid - neg Antimicrobials:  Azithromycin 11/27>>> Merrem 11/27>> Flagyl 04/06/2019>> Vancomycin 03/28/2019>> Rocephin 11/5>> 11/27 Cipro 11/5 x 1 dose  Interim history/subjective:  Pt is unable to respond. RR is 40  Sats are 92% on BiPAP 70% , IPAP of 18 EPAP of 6 , Rate of 8 Pt is using abdominal and accessory muscles to breath He looks uncomfortable, facial grimacing WBC 9.6 T Max last 24 is 99.1  Net negative 5.2 L Objective   Blood pressure (!) 143/68, pulse 76, temperature 98.4 F (36.9 C), temperature source Oral, resp. rate (!) 23, height 5\' 10"  (1.778 m), weight 114.6 kg, SpO2 93 %.    FiO2 (%):  [50 %-70 %] 70 %   Intake/Output Summary (Last 24 hours) at 03/20/2019 0956 Last data filed at 03/23/2019 1607 Gross per 24 hour  Intake 1371.92 ml  Output 5090 ml  Net -3718.08 ml   Filed Weights   04/11/19 0402 04/12/19 0457 04/02/2019 0623  Weight: 103 kg 115.7 kg 114.6 kg    Examination: General: Minimally responsive male on BiPAP, appears uncomfortable, Sats 92% HENT: NCAT, MM dry, No LAD, + JVD  Lungs: Bilateral chest excursion,Coarse throughout, accessory and abdominal muscle use noted, coarse with few rales per bases,on BiPAP Cardiovascular: S1. S2, Irr, No RMG Abdomen: Soft, NT, NT, BS diminished, L perinephric drain, obese Extremities: Warm and dry, Brisk capillary refill, no obvious deformities Neuro: Minimally responsive, per family was following commands earlier today   Resolved Hospital Problem list     Assessment & Plan:  Acute on Chronic Hypercarbic / Hypoxic   Respiratory Failure  Multifactorial>> Aspiration pneumonia, Worsening HF. Small bilateral effusions ( tapped 11/26 with 1 L fluid removal) PAH, OSA/OHS DNR status established 11/24  Plan Titrate oxygen for sats 88-92% BiPAP as ordered by primary team NPO on BiPAP ABG prn Aspiration precautions CXR in am and prn Continue Bronchodilators as ordered Continue Lasix as BP and creatinine tolerate Antibiotics as initiated by primary team Blood Cultures as initiated by Primary team Follow micro Continue prn Fentanyl for pain/ comfort Continue Palliation conversations with family >> would not intubate patient as he would likely never be liberated from mechanical ventilation Comfort care is best option as patient is unlikely to survive this admission  Pt family are in agreement that patient should not be intubated. He is currently on continuous BiPAP, with accessory muscle use and appears uncomfortable.Family are ok with initiation of  new antibiotics and blood cultures.  Palliative Care NP is working with family now regarding implementation of comfort measures as patient appears uncomfortable. Currently using prn fentanyl for comfort.     CC APP time 45 minutes   Bevelyn NgoSarah F. Alder Murri, MSN, AGACNP-BC [facility]monary/Critical [facility] Medicine Pager # [phone] After 4 pm please call [phone]  Best practice:  Diet: NPO Pain/Anxiety/Delirium protocol (if indicated): Per Primary team and Palliative care VAP  protocol (if indicated):  DVT prophylaxis: Per primary team GI prophylaxis: Per Primary Glucose control: CBG/ SSI Mobility: BR Code Status: DNR Family Communication: Son and daughter updated at bedside Disposition: Progressive Care  Labs   CBC: Recent Labs  Lab 04/10/19 0514 04/10/19 1953 04/11/19 1701 04/12/19 0603 2018/08/23 0404  WBC 6.5 6.7 7.2 7.8 9.6  NEUTROABS  --  4.9  --   --   --   HGB 8.8* 8.6* 8.7* 9.1* 8.2*  HCT 30.3* 29.7* 29.4* 31.3* 27.3*  MCV 107.8* 106.5* 106.9* 105.7* 104.6*  PLT 259 266 264 259 218    Basic Metabolic Panel: Recent Labs  Lab 04/07/19 0353 04/10/19 1953 04/11/19 1701 04/12/19 0603 2018/08/23 0404  NA 143 147* 145 148* 149*  K 4.0 3.4* 3.1* 3.1* 3.3*  CL 100 101 100 99 94*  CO2 37* 39* 40* 40* 43*  GLUCOSE 106* 121* 107* 125* 151*  BUN 19 15 11 8  7*  CREATININE 0.51* 0.57* 0.55* 0.60* 0.60*  CALCIUM 9.2 8.9 8.7* 8.8* 8.6*  MG  --  2.2  --   --   --   PHOS  --  3.3  --   --   --    GFR: Estimated Creatinine Clearance: 79.3 mL/min (A) (by C-G formula based on SCr of 0.6 mg/dL (L)). Recent Labs  Lab 04/07/19 0353  04/10/19 1953 04/10/19 1954 04/11/19 1701 04/12/19 0603 2018/08/23 0404  PROCALCITON <0.10  --  <0.10  --   --   --   --   WBC 4.8   < > 6.7  --  7.2 7.8 9.6  LATICACIDVEN  --   --   --  0.6  --   --   --    < > = values in this interval not displayed.    Liver Function Tests: Recent Labs  Lab 04/10/19 1953  AST 17  ALT 14  ALKPHOS 45  BILITOT 0.4  PROT 6.1*  ALBUMIN 2.3*   No results for input(s): LIPASE, AMYLASE in the last 168 hours. No results for input(s): AMMONIA in the last 168 hours.  ABG    Component Value Date/Time   PHART 7.336 (L) 2020/08/818 0627   PCO2ART 86.2 (HH) 2020/08/818 0627   PO2ART 71.4 (L) 2020/08/818 0627   HCO3 44.9 (H) 2020/08/818 0627   O2SAT 94.0 2020/08/818 0627     Coagulation Profile: No results for input(s): INR, PROTIME in the last 168 hours.  Cardiac Enzymes: No  results for input(s): CKTOTAL, CKMB, CKMBINDEX, TROPONINI in the last 168 hours.  HbA1C: Hgb A1c MFr Bld  Date/Time Value Ref Range Status  06/02/2015 09:40 AM 5.6 4.6 - 6.5 % Final    Comment:    Glycemic Control Guidelines for People with Diabetes:Non Diabetic:  <6%Goal of Therapy: <7%Additional Action Suggested:  >8%   01/22/2013 04:34 PM 5.4 4.6 - 6.5 % Final    Comment:    Glycemic Control Guidelines for People with Diabetes:Non Diabetic:  <6%Goal of Therapy: <7%Additional Action Suggested:  >8%     CBG:  Recent Labs  Lab 04/12/19 1630 04/12/19 2012 04/14/2019 0000 April 14, 2019 0346 April 14, 2019 0731  GLUCAP 146* 126* 140* 148* 147*    Review of Systems:   Pt. Unable as he is minimally responsive and on BiPAP  Past Medical History  He,  has a past medical history of A-fib (HCC), Acute systolic CHF (congestive heart failure), NYHA class 3 -- Unclear etilogy (? Afib related) EF down from 60-70% to 40%. (07/05/2012), Arthritis, Asthma, ASTHMA (02/06/2007), BENIGN PROSTATIC HYPERTROPHY (05/13/2010), Bifascicular block (12/18/2013), BPH (benign prostatic hypertrophy), Bronchospasm, exercise-induced, CHF (congestive heart failure) (HCC), Chronic kidney disease, Chronic rhinitis (06/02/2009), DJD (degenerative joint disease), lumbar (01/18/2012), Dyslipidemia, Edema, General weakness (07/05/2012), GERD (gastroesophageal reflux disease), History of elevated glucose (10/01/2009), colonic polyps (11/02/2011), Hypertension, INSOMNIA, CHRONIC, MILD (01/02/2008), Macular degeneration (08-06-13), Obesity, Paroxysmal Atrial fibrillation - admitted with RVR (01/18/2012), S/P left THA, AA (08/13/2013), and Shortness of breath.   Surgical History    Past Surgical History:  Procedure Laterality Date  . CARDIOVERSION N/A 07/07/2012   Procedure: CARDIOVERSION;  Surgeon: Chrystie Nose, MD;  Location[facility] ENDOSCOPY;  Service: Cardiovascular;  Laterality: N/A;  . CARDIOVERSION N/A 08/30/2012   Procedure: CARDIOVERSION;   Surgeon: Chrystie Nose, MD;  Location: [facility] ENDOSCOPY;  Service: Cardiovascular;  Laterality: N/A;  . IR FLUORO GUIDE CV LINE RIGHT  04/15/2017  . IR NEPHROSTOMY PLACEMENT LEFT  03/22/2019  . IR NEPHROSTOMY PLACEMENT LEFT  04/02/2019  . IR SINUS/FIST TUBE CHK-NON GI  04/02/2019  . IR SINUS/FIST TUBE CHK-NON GI  04/02/2019  . IR US GUIDE VASC ACCESS RIGHT  04/15/2017  . IRRIGATION AND DEBRIDEMENT ABSCESS N/A 02/08/2017   Procedure: IRRIGATION AND DEBRIDEMENT SACRAL ULCER;  Surgeon: Berna Bue, MD;  Location: [facility] ORS;  Service: General;  Laterality: N/A;  . TOTAL HIP ARTHROPLASTY Left 08/13/2013   Procedure: LEFT TOTAL HIP ARTHROPLASTY ANTERIOR APPROACH;  Surgeon: [name], MD;  Location: [facility] ORS;  Service: Orthopedics;  Laterality: Left;  . TOTAL KNEE ARTHROPLASTY Left 01/24/2017   Procedure: LEFT TOTAL KNEE ARTHROPLASTY;  Surgeon: Durene Romans, MD;  Location: [facility] ORS;  Service: Orthopedics;  Laterality: Left;  90 mins  . TRANSTHORACIC ECHOCARDIOGRAM  07/07/2012   ef 40%; mild MR; LA mod-severely dilated; RA mildly dilated; RV systolic function mildly reduced; RV systolic pressure increase - mod pulm htn  . VASCULAR SURGERY  removed varicose veins  . VASECTOMY    . viritual colonoscopy  04/2017   Dr Marca Ancona     Social History   reports that he has quit smoking. He has never used smokeless tobacco. He reports that he does not drink alcohol or use drugs.   Family History   His family history includes Alzheimer's disease in his mother; Heart disease in his father. There is no history of Colon cancer or Esophageal cancer.   Allergies Allergies  Allergen Reactions  . Nsaids Other (See Comments)    Stomach pain  . Morphine And Related Nausea And Vomiting and Other (See Comments)    Dizziness, light-headedness and "Opiates cause tightness in chest"  . Vicodin [Hydrocodone-Acetaminophen] Anxiety     Home Medications  Prior to Admission medications   Medication Sig Start Date End  Date Taking? Authorizing Provider  acetaminophen (TYLENOL) 500 MG tablet Take 2 tablets (1,000 mg total) by mouth every 8 (eight) hours. Patient taking differently: Take 1,000 mg by mouth See admin instructions. Take 1,000 mg by mouth every eight hours and do not exceed 4,000 mg/24 hours from all combined sources  01/24/17  Yes Babish, Molli Hazard, [name]  albuterol (PROVENTIL HFA;VENTOLIN HFA) 108 (90 Base) MCG/ACT inhaler Inhale 2 puffs into the lungs every 4 (four) hours as needed for wheezing or shortness of breath.    Yes [provider]  Amino Acids-Protein Hydrolys (FEEDING SUPPLEMENT, PRO-STAT SUGAR FREE 64,) LIQD Take 30 mLs by mouth 3 (three) times daily.    Yes [provider]  amiodarone (PACERONE) 200 MG tablet TAKE ONE TABLET BY MOUTH ONCE DAILY Patient taking differently: Take 200 mg by mouth daily.  10/29/16  Yes Hilty, Lisette Abu, MD  Artificial Saliva (BIOTENE DRY MOUTH MOISTURIZING) SOLN 1 spray by Mouth Rinse route every 2 (two) hours as needed (for dryness).   Yes [provider]  bumetanide (BUMEX) 1 MG tablet Take 1 tablet (1 mg total) by mouth 2 (two) times daily. 10/15/16  Yes Hilty, Lisette Abu, MD  cetirizine (ZYRTEC) 10 MG tablet Take 10 mg by mouth daily.    Yes [provider]  Cholecalciferol (VITAMIN D-3) 25 MCG (1000 UT) CAPS Take 1,000 Units by mouth daily.   Yes [provider]  docusate sodium 100 MG CAPS Take 100 mg by mouth 2 (two) times daily. 08/15/13  Yes Babish, Molli Hazard, [name]  famotidine (PEPCID) 20 MG tablet Take 1 tablet (20 mg total) by mouth 2 (two) times daily. 02/21/17  Yes Tyrone Nine, MD  ferrous sulfate (FERROUSUL) 325 (65 FE) MG tablet Take 1 tablet (325 mg total) by mouth 3 (three) times daily with meals. Patient taking differently: Take 325 mg by mouth 2 (two) times daily with a meal.  01/24/17  Yes Babish, Molli Hazard, [name]  gabapentin (NEURONTIN) 400 MG capsule TAKE 1 CAPSULE BY MOUTH THREE TIMES DAILY Patient taking  differently: Take 400 mg by mouth 3 (three) times daily.  01/14/17  Yes [name], MD  ipratropium-albuterol (DUONEB) 0.5-2.5 (3) MG/3ML SOLN Take 3 mLs by nebulization every 4 (four) hours as needed (wheezing).  03/14/19  Yes [provider]  KLOR-CON M20 20 MEQ tablet Take 20 mEq by mouth daily. 11/11/18  Yes [provider]  midodrine (PROAMATINE) 10 MG tablet Take 1 tablet (10 mg total) by mouth 3 (three) times daily. 05/28/17  Yes Tyrone Nine, MD  Multiple Vitamins-Minerals (CERTAGEN PO) Take 1 tablet by mouth daily.    Yes [provider]  Multiple Vitamins-Minerals (PRESERVISION AREDS 2) CAPS Take 1 capsule by mouth 2 (two) times daily.   Yes [provider]  nutrition supplement, JUVEN, (JUVEN) PACK Take 1 packet by mouth 2 (two) times daily between meals. 02/21/17  Yes Tyrone Nine, MD  Nutritional Supplements (RESOURCE 2.0) LIQD Take 120 mLs by mouth at bedtime.   Yes [provider]  OXYGEN Inhale 2 L/min into the lungs as needed (for shortness of breath).    Yes [provider]  polyethylene glycol (MIRALAX / GLYCOLAX) packet Take 17 g by mouth 2 (two) times daily. Patient taking differently: Take 17 g by mouth See admin instructions. Mix 17 grams into 4-8 ounces of liquid and drink two times a day 01/24/17  Yes Babish, Molli Hazard, [name]  PRESCRIPTION MEDICATION BiPAP: At bedtime   Yes [provider]  rivaroxaban (XARELTO) 20 MG TABS tablet Take 1 tablet (20 mg total) by mouth daily. 08/05/15  Yes Hilty, Lisette Abu, MD  rosuvastatin (CRESTOR) 20 MG tablet TAKE ONE TABLET BY MOUTH ONCE A WEEK, FRIDAY Patient taking differently: Take 20 mg by mouth every Friday.  12/24/16  Yes Hilty, Lisette Abu, MD  simethicone (MYLICON) 125 MG chewable tablet Chew 125 mg by mouth 3 (three) times daily. FOR 5 DAYS 2019/04/03 03/26/19 Yes [provider]  SPIRIVA HANDIHALER 18 MCG inhalation capsule INHALE ONE PUFF BY MOUTH ONCE DAILY  Patient taking differently: Place 18 mcg into inhaler and inhale daily.  03/18/16  Yes [name], MD  tamsulosin (FLOMAX) 0.4 MG CAPS capsule TAKE 1 CAPSULE BY MOUTH ONCE DAILY Patient taking differently: Take 0.4 mg by mouth daily.  12/24/16  Yes [name], MD  traMADol (ULTRAM) 50 MG tablet Take 1-2 tablets (50-100 mg total) by mouth every 12 (twelve) hours as needed for moderate pain or severe pain (and before hydrotherapy). Patient taking differently: Take 50 mg by mouth at bedtime.  05/28/17  Yes Tyrone Nine, MD  protein supplement shake (PREMIER PROTEIN) LIQD Take 325 mLs (11 oz total) by mouth daily. Patient not taking: Reported on April 03, 2019 02/21/17   Tyrone Nine, MD     Critical care APP time: 17 Minutes    Bevelyn Ngo, MSN, AGACNP-BC [facility] Pulmonary/Critical Care Medicine Pager # [phone] After 4 pm please call [phone] 04/11/2019 11:54 AM

## 2019-04-17 NOTE — Progress Notes (Signed)
PROGRESS NOTE  [name] [place] [id number]:[id number] DOB: 1927-02-06 DOA: 03/19/2019 PCP: System, Pcp Not In  HPI/Recap of past 24 hours: 85 y.o.malewith medical history significant foratrial fibrillation on Xarelto, chronic diastolic CHF, asthma/COPD, OSA on BiPAP, chronic hypotension on midodrine, and chronic sacral ulcer, who was brought to the emergency department for evaluation of progressive abdominal distention with alternating constipation and watery stool. Found to have perinephric fluid collection requiring nephrostomy tube. Patient is from long-term nursing home.  He used to walk with a walker and has not done so for at least 6 months now.  Mostly wheelchair-bound. Underwent multiple procedures, percutaneous nephrostomy tube on 11/5, 11/11, 04/02/2019 with interventional radiology. Remains in the hospital to complete treatment. 11/24: Respiratory distress around 1830. Daughter Angela Nevin in the room. She made change in code status. He is now DNR. No CPR, No intubation. Ok to use BIPAP. PH 7.2 with PCO2 107. Evidence of aspiration on bedside suctioning with food content removed. CXR shows pulmonary edema, cardiomegaly, and b/l pleural effusions. Received 1 dose IV lasix 20 mg once, judiciously, due to soft BPs. 11/25: On BiPAP.  His daughter in the room.  Responds to questions appropriately.  Last PCO2 of 82 on 04/10/19.  Repeat ABG this morning>> 86.  Curbsided with PCCM, discussed with Dr. Nelda Marseille. Advised palliative care involvement and comfort care.  CT chest>>interstitial edema, findings consistent with aspiration, abdomen and pelvis to assess his 2 abdominal drains and percutaneous nephrostomy tube>> persistent but improved. 11/26: His son [name] present in the room.  On BiPAP.  Left thoracentesis planned and completed by interventional radiologist with 1 L of amber/low-density fluid removed. Elevated BNP greater than 600.  TTE ordered to further assess cardiac structure, pending.  15-Apr-2019: No  improvement in clinical picture despite aggressive treatment. Reviewed CXR>> worsening infiltrates suggestive of early ARDS.  ABG without improvement despite continuous BIPAP. PCCM contacted. Daughter Angela Nevin updated on worsening condition.  Adult children and palliative care provider met, decision was made for comfort care. Daughter Angela Nevin and son [name] with Mr. Bones at his bedside.  Assessment/Plan: Principal Problem:   Perinephric fluid collection Active Problems:   Asthma   Paroxysmal Atrial fibrillation - admitted with RVR   Chronic diastolic CHF (congestive heart failure) (HCC)   AF (paroxysmal atrial fibrillation) (HCC)   Hypokalemia   Ileus (HCC)   Closed compression fracture of L4 vertebra (HCC)   Sacral pressure ulcer   Renal cyst, left   Perinephric abscess   Advanced care planning/counseling discussion   Goals of care, counseling/discussion   Palliative care by specialist   Pleural effusion   Acute on chronic respiratory failure with hypoxia and hypercapnia ([place])  Perinephric fluid collection/left-sided hydronephrosis with urinoma/complicatedby Proteus mirabilis UTI post percutaneous nephrostomy tube placement on 03/22/2019 and 04/03/2019. 03/22/2019, superior pole nephrostomy tube placed. 03/28/2019, inferior pole nephrostomy tube placed due to collection not drained. 04/02/2019, inadequate drainage and persistent collection, new nephrostomy tube.  Initial urine culture with Proteus.  Blood cultures negative.   Left PCN and left perinephric drains x 2 On IV Flagyl and Rocephin.   CT abdomen and pelvis on 04/11/2019 to reassess drains x2 and percutaneous nephrostomy>> persistent but improved. IR followed  Acute toxic encephalopathy secondary to CO2 narcosis on BiPAP continuously  PH 7.2 with PCO2 107 on 04/10/2019 associated with lethargy Improved with BiPAP>>Repeat ABG done on 04/10/2019 while on BiPAP FiO2 40% showed pH 7.3 and PCO2 of 82.  ABG on BIPAP FiO2 40%  04/11/2019>>PCO2 86.7 with PH 7.314.  ABG 04/23/19 BIPAP FiO2 70%>> PCO2 86.2, PH 7.336.  Acute on chronic hypoxic hypercarbic respiratory failure secondary to pulmonary edema and bilateral pleural effusion post left thoracentesis with 1 L of amber/blood tinged fluid removed on 04/12/2019 by interventional radiology. On IV antibiotics empirically due to for concern for aspiration pneumonia Increased nebs Continued BIPAP and NPO while on BIPAP (D10W 30 cc/hr to avoid hypoglycemia while NPO) Worsening hypoxia and hypercarbia>> now requiring FiO2 70% on BIPAP 18/6. Independently reviewed CXR done on April 23, 2019. Worsening bilateral infiltrates with appearance of early ARDS in the setting of aspiration PNA.  Bilateral pleural effusion left greater than right post left thoracentesis Management as per above Body fluid CX no growth < 24 hours  Pulmonary artery hypertension On CT chest official report TTE pending  Chronic diastolic CHF Last 2D echo done on 05/19/2017 showed normal LVEF 60 to 65% with grade 1 diastolic dysfunction On Bumex at home which was held due to hypotension and soft blood pressures>> patient on midodrine 10 mg 3 times daily. BNP elevated greater than 600 on 04/12/19. Gentle diuresing to avoid hypotension. IV albumin 50 g q6H for intravascular volume repletion to maintain MAP>65. TTE ordered on 04/12/2019 to further evaluate cardiac structure.  Chronic hypotension on midodrine Midodrine, oral vasopressor is currently on hold due to n.p.o. status Maintaining blood pressure with IV albumin by expanding volume  IV Lasix 20 mg TID, maintain MAP greater than 65  Gross hematuria likely secondary to coagulopathy On heparin drip for CVA prevention due to chronic A. Fib Monitor H&H Heparin drip managed by pharmacy  Refractory hypokalemia Potassium 3.1 with magnesium of 2.2 on 04/12/19 K+ 3.3 with magnesium 2.1 on 04-23-2019 Repleted with IV KCl   Chronic CO2 retention in the  setting of OSA, COPD, and suspected obesity hypoventilation syndrome. Continue BiPAP at night and during naps>> n.p.o. while on BiPAP to avoid aspiration.  COPD Continue bronchodilators Continue to maintain O2 saturation greater than 90%  Chronic A. Fib Currently rate controlled On amiodarone for rhythm control On heparin drip for CVA prevention  Polyneuropathy Gabapentin when no longer NPO  Chronic sacral pressure ulcer stage IV present on admission: Followed by wound care.  Management per wound care specialist's recommendation.  Physical debility/ambulatory dysfunction Continue PT OT with assistance and fall precautions PT recommended SNF CSW assisting with placement.  Goals of care Palliative care team following met with family on 04/12/19 and 2019-04-23. Decision for comfort care on Apr 23, 2019.   Patient is now COMFORT CARE ONLY. BIPAP has been removed at patient's own request. Death is imminent.    DVT prophylaxis: Heparin drip Code Status: DNR Family Communication:  Updated daughter via phone on 2019-04-23.  Disposition Plan:  Anticipated hospital death.  Consultants:   Urology  Interventional radiology  Palliative care team  PCCM curbsided on 04/11/19, discussed with Dr. Nelda Marseille  PCCM consulted 04-23-2019.  Procedures:   Percutaneous drain, 11/5, 11/11 left kidney  Percutaneous drain, nephrostomy 04/02/2019  Antimicrobials:   Rocephin, 03/19/2019---04/23/19  IV Flagyl 04/06/2019---04-23-2019  Meropenem 04-23-2019  IV vancomycin 2019/04/23    Objective: Vitals:   April 23, 2019 0755 Apr 23, 2019 0756 2019-04-23 1035 2019/04/23 1126  BP:    (!) 126/59  Pulse:  76 81 87  Resp:  (!) 23 (!) 41 (!) 38  Temp:    98.7 F (37.1 C)  TempSrc:    Oral  SpO2: 93% 93% 92% 93%  Weight:      Height:        Intake/Output Summary (Last  24 hours) at Apr 15, 2019 1513 Last data filed at 04/15/19 1324 Gross per 24 hour  Intake 1371.92 ml  Output 3940 ml  Net -2568.08 ml    Filed Weights   04/11/19 0402 04/12/19 0457 04/15/19 0623  Weight: 103 kg 115.7 kg 114.6 kg    Exam:  . General: 85 y.o. year-old male On BIPAP somnolent and minimally interactive. Worsened from yesterday. . Cardiovascular: Irregular rate and rhythm.  No rubs or gallops. [name] Respiratory: Diffuse rales bilaterally.  Poor respiratory effort . Abdomen: Positive bowel sounds.  3 tubes to left lateral abdominal quadrant.   . Musculoskeletal: Improving 1+ pitting edema in lower extremities bilaterally.   [name] Psychiatry: Unable to assess mood due to somnolence  Data Reviewed: CBC: Recent Labs  Lab 04/10/19 1953 04/11/19 1701 04/12/19 0603 April 15, 2019 0404 04/15/19 1112  WBC 6.7 7.2 7.8 9.6 8.6  NEUTROABS 4.9  --   --   --   --   HGB 8.6* 8.7* 9.1* 8.2* 7.8*  HCT 29.7* 29.4* 31.3* 27.3* 26.3*  MCV 106.5* 106.9* 105.7* 104.6* 105.6*  PLT 266 264 259 218 195   Basic Metabolic Panel: Recent Labs  Lab 04/07/19 0353 04/10/19 1953 04/11/19 1701 04/12/19 0603 04/15/19 0404 2019/04/15 1157  NA 143 147* 145 148* 149*  --   K 4.0 3.4* 3.1* 3.1* 3.3*  --   CL 100 101 100 99 94*  --   CO2 37* 39* 40* 40* 43*  --   GLUCOSE 106* 121* 107* 125* 151*  --   BUN _0 7*  --   CREATININE 0.51* 0.57* 0.55* 0.60* 0.60*  --   CALCIUM 9.2 8.9 8.7* 8.8* 8.6*  --   MG  --  2.2  --   --   --  2.1  PHOS  --  3.3  --   --   --   --    GFR: Estimated Creatinine Clearance: 79.3 mL/min (A) (by C-G formula based on SCr of 0.6 mg/dL (L)). Liver Function Tests: Recent Labs  Lab 04/10/19 1953  AST 17  ALT 14  ALKPHOS 45  BILITOT 0.4  PROT 6.1*  ALBUMIN 2.3*   No results for input(s): LIPASE, AMYLASE in the last 168 hours. No results for input(s): AMMONIA in the last 168 hours. Coagulation Profile: No results for input(s): INR, PROTIME in the last 168 hours. Cardiac Enzymes: No results for input(s): CKTOTAL, CKMB, CKMBINDEX, TROPONINI in the last 168 hours. BNP (last 3 results) No results  for input(s): PROBNP in the last 8760 hours. HbA1C: No results for input(s): HGBA1C in the last 72 hours. CBG: Recent Labs  Lab 04/12/19 2012 04-15-2019 0000 Apr 15, 2019 0346 15-Apr-2019 0731 2019-04-15 1128  GLUCAP 126* 140* 148* 147* 147*   Lipid Profile: No results for input(s): CHOL, HDL, LDLCALC, TRIG, CHOLHDL, LDLDIRECT in the last 72 hours. Thyroid Function Tests: No results for input(s): TSH, T4TOTAL, FREET4, T3FREE, THYROIDAB in the last 72 hours. Anemia Panel: No results for input(s): VITAMINB12, FOLATE, FERRITIN, TIBC, IRON, RETICCTPCT in the last 72 hours. Urine analysis:    Component Value Date/Time   COLORURINE AMBER (A) 03/29/2019 1117   APPEARANCEUR CLOUDY (A) 03/29/2019 1117   LABSPEC 1.017 03/29/2019 1117   PHURINE 8.0 03/29/2019 1117   GLUCOSEU NEGATIVE 03/29/2019 1117   HGBUR LARGE (A) 03/29/2019 1117   BILIRUBINUR NEGATIVE 03/29/2019 1117   KETONESUR NEGATIVE 03/29/2019 1117   PROTEINUR 100 (A) 03/29/2019 1117   UROBILINOGEN 0.2 08/06/2013 1006   NITRITE  NEGATIVE 03/29/2019 1117   LEUKOCYTESUR SMALL (A) 03/29/2019 1117   Sepsis Labs: _0 (procalcitonin:4,lacticidven:4)  ) Recent Results (from the past 240 hour(s))  Gram stain     Status: None   Collection Time: 04/12/19 11:15 AM   Specimen: PATH Cytology Pleural fluid  Result Value Ref Range Status   Specimen Description PLEURAL  Final   Special Requests NONE  Final   Gram Stain   Final    FEW WBC PRESENT, PREDOMINANTLY MONONUCLEAR NO ORGANISMS SEEN Performed at [facility], [place] [address] [place], [place] [address]    Report Status 04/12/2019 FINAL  Final  Culture, body fluid-bottle     Status: None (Preliminary result)   Collection Time: 04/12/19 11:15 AM   Specimen: Pleura  Result Value Ref Range Status   Specimen Description PLEURAL  Final   Special Requests NONE  Final   Culture   Final    NO GROWTH < 24 HOURS Performed at [facility], [place] [address] [place],  [place] [address]    Report Status PENDING  Incomplete      Studies: Dg Chest Port 1 View  Result Date: 2019/04/29 CLINICAL DATA:  Hypoxia. EXAM: PORTABLE CHEST 1 VIEW COMPARISON:  04/12/2019 FINDINGS: The cardiac silhouette remains enlarged. There are persistent small bilateral pleural effusions. There are relatively extensive airspace opacities bilaterally which are greatest in the upper lobes with interval substantial worsening on the left. No pneumothorax is identified. IMPRESSION: 1. Diffuse bilateral airspace opacities with substantial interval worsening on the left which may reflect pneumonia or edema. 2. Small bilateral pleural effusions. 3. Unchanged cardiomegaly. Electronically Signed   By: Logan Bores M.D.   On: 29-Apr-2019 07:13    Scheduled Meds: . acetylcysteine  4 mL Nebulization BID  . famotidine  20 mg Oral BID  . feeding supplement (PRO-STAT SUGAR FREE 64)  30 mL Oral BID WC  . furosemide  20 mg Intravenous TID  . midodrine  10 mg Oral TID WC  . sodium chloride flush  10-40 mL Intracatheter Q12H  . sodium chloride flush  3 mL Intravenous Q12H  . sodium chloride flush  5 mL Intracatheter Q8H  . umeclidinium bromide  1 puff Inhalation Daily    Continuous Infusions: . sodium chloride 250 mL (04/09/19 0713)  . albuterol 2.5 mg/hr (04/29/2019 1033)  . azithromycin    . fentaNYL infusion INTRAVENOUS    . meropenem (MERREM) IV    . metronidazole 500 mg (04-29-2019 1317)  . vancomycin       LOS: 22 days     Kayleen Memos, MD [facility] Pager [phone]  If 7PM-7AM, please contact night-coverage [email or web address] Password [facility] 04/29/2019, 3:13 PM

## 2019-04-17 NOTE — Progress Notes (Signed)
Pt and pt family requested to leave pt off BiPAP at this time. BiPAP on stand by at pt bedside at this time. Family with pt currently. RT will monitor.

## 2019-04-17 NOTE — Progress Notes (Signed)
Pharmacy Antibiotic Note  [name] is a 85 y.o. male admitted on 04/05/2019 with perinephric fluid collections, BL pleural effusions and HCAP/aspiration PNA with worsening hypoxia despite ceftriaxone and metronidazole.  Pharmacy has been consulted for vanc/mero dosing. After discussion with Dr. [place] Crane regarding broadening to meropenem vs cefepime, she preferred to continue with meropenem given that blood cultures are pending so ESBL is not ruled out and patient is clinically worsening.   Afeb, WBC 7.8> 9.6, Scr 0.6 stable, requiring continuous BIPAP to maintain Ox sat > 91%, ABG 7.33/86.2/71.4/44.9/94 - has been persistently retaining CO2 and bicarb since admission 3 weeks ago.   11/27 CXR: Diffuse bilateral airspace opacities with substantial interval worsening on the left which may reflect pneumonia or edema. Small bilateral pleural effusions  11/26 Thoracentesis out 1L   CXR: Multifocal patchy opacities, right upper lobe predominant, suspicious for pneumonia. Small bilateral pleural effusions, improved on the left status post thoracentesis.  11/25 CT: Persistent but significantly improved perinephric fluid collections as above.  Multifocal patchy airspace opacities in the dependent portions of the right greater than left lung with evidence of debris in the right lower and middle lobe bronchi. Findings are consistent with the clinical suspicion of aspiration. Mild pulmonary edema. Moderate left and small right pleural effusions.  Plan: Start Vancomycin 2g Q24 hr Start meropenem 1g Q8 hr F/u renal fx, cultures, clinical status De-escalate antibiotics when able    Height: 5\' 10"  (177.8 cm) Weight: 252 lb 10.4 oz (114.6 kg) IBW/kg (Calculated) : 73  Temp (24hrs), Avg:98.6 F (37 C), Min:98.3 F (36.8 C), Max:99.1 F (37.3 C)  Recent Labs  Lab 04/07/19 0353  04/10/19 0514 04/10/19 1953 04/10/19 1954 04/11/19 1701 04/12/19 0603 April 29, 2019 0404  WBC 4.8   < > 6.5 6.7  --  7.2 7.8  9.6  CREATININE 0.51*  --   --  0.57*  --  0.55* 0.60* 0.60*  LATICACIDVEN  --   --   --   --  0.6  --   --   --    < > = values in this interval not displayed.    Estimated Creatinine Clearance: 79.3 mL/min (A) (by C-G formula based on SCr of 0.6 mg/dL (L)).    Antimicrobials this admission: Meropenem 11/27>> Vanc 11/27>>  CTX 11/5 >>11/26 MTZ 11/20 >> Cipro x1 11/5  Microbiology results: 11/27 BCx pend 11/26 pleural fluid pend 11/15 covid neg  11/13 UCx Neg 11/11 abscess/perinephric drain + Proteus (S- cefaz) 11/5 L retroperitoneal abscess cx +Proteus (R: amp, unasyn, cipro, bactrim) 11/5 mrsa pcr - neg 11/5 UCx - 100 colonies proteus (R: see above) 11/4 covid - neg  Thank you for allowing pharmacy to be a part of this patient's care.   Benetta Spar, PharmD, BCPS, BCCP Clinical Pharmacist  Please check AMION for all [place] phone numbers After 10:00 PM, call [place] [phone]

## 2019-04-17 NOTE — Progress Notes (Addendum)
Pt with continued c/o SOB with resp 40-50/min, unrelieved by lasix and solumedrol. Respiratory notified and currently at bedside but state they are unable to increase bipap settings as they are already maxed. Pt states he is not in any pain. This nurse asked pt if he would like something to help him rest and help relax him to breathe easier and pt readily agrees to this. MD on call notified.

## 2019-04-17 NOTE — TOC Progression Note (Signed)
Transition of Care [facility]) - Progression Note    Patient Details  Name: [name] MRN: [id number] Date of Birth: 05-20-1926  Transition of Care [facility]) CM/SW [place], [place] Phone Number: [phone] 23-Apr-2019, 9:07 AM  Clinical Narrative:     CSW continues to follow for discharge planning, patient is LTC resident at [facility], per family wishes plan remains to dc back to [facility] when patient is medically stable.    Expected Discharge Plan: [place] Barriers to Discharge: Continued Medical Work up  Expected Discharge Plan and Services Expected Discharge Plan: [place] In-house Referral: Clinical Social Work Discharge Planning Services: NA Post Acute Care Choice: [place] Living arrangements for the past 2 months: [place]                 DME Arranged: N/A DME Agency: NA       HH Arranged: NA HH Agency: NA         Social Determinants of Health (SDOH) Interventions    Readmission Risk Interventions No flowsheet data found.

## 2019-04-17 NOTE — Progress Notes (Signed)
   May 04, 2019 1700  Attending [place]  Attending Physician Notified Y  Attending Physician (First and Last Name) Helene Shoe  Will the above attending physician sign death certificate? Yes  Post Mortem Checklist  Date of Death 2019/05/04  Time of Death 14  Pronounced By Yetta Barre T-D,RN & Palliative NP  Next of kin notified Yes  Name of next of kin notified of death Family at Bedside  Contact Person's Relationship to Patient Daughter Angela Nevin [facility])  Contact Person's Phone Number [phone]  Contact Person's address [place], [place], [place] [address]  Family Communication Notes Son: Mckenna Boruff [phone]  Was the patient a No Code Blue or a Limited Code Blue? Yes  Did the patient die unattended? No  Patient restrained? Not applicable  Height '5\' 10"'$  (1.778 m)  Weight 114.6 kg  [place] [facility]  Notification Date 2019-05-04  Notification Time [place] Donor Service Number [phone]  Is patient a potential donor? N  Autopsy  Autopsy requested by N/A  Patient Belongings/Medications Returned  Patient belongings from bedside/safe/pharmacy returned  None  Valuables returned to? [place] returned n/a  Dead on Arrival (Emergency Department)  Patient dead on arrival? No  Medical Examiner  Is this a medical examiner's case? [place] home name/address/phone # [place] [phone]    Removal of condom catheter/gown, clean up is all that remains to be done for PM checklist.  Pt is otherwise ready for pick-up/transfer

## 2019-04-17 NOTE — Death Summary Note (Signed)
Death Summary  Cadell Gabrielson Luzier ZHG:[id number] DOB: 1926/07/01 DOA: 03-27-19  PCP: System, Pcp Not In  Admit date: 03/27/19 Date of Death: 2019/04/19 Time of Death: 28-Jul-1639 Notification: System, Pcp Not In notified of death of 04/19/2019   History of present illness:  Van Seymore Dinkel is a 85 y.o. male with medical history significant foratrial fibrillation on Xarelto, chronic diastolic CHF, asthma/COPD, OSA on BiPAP, chronic hypotension on midodrine, and chronic sacral ulcer, who was brought to the emergency department for evaluation of progressive abdominal distention with alternating constipation and watery stool. Found to have perinephric fluid collection requiring nephrostomy tube. Patient is from long-term nursing home. He used to walk with a walker and has not done so for at least 6 months now. Mostly wheelchair-bound. Underwent multiple procedures, percutaneous nephrostomy tube on 11/5, 11/11, 04/02/2019 with interventional radiology. Remains in the hospital to complete treatment. 11/24: Respiratory distress around 1830. Daughter Angela Nevin in the room. She made change in code status. He is now DNR. No CPR, No intubation. Ok to use BIPAP. PH 7.2 with PCO2 107. Evidence of aspiration on bedside suctioning with food content removed. CXR shows pulmonary edema, cardiomegaly, and b/l pleural effusions. Received 1 dose IV lasix 20 mg once, judiciously, due to soft BPs. 11/25: On BiPAP.  His daughter in the room.  Responds to questions appropriately.  Last PCO2 of 82 on 04/10/19.  Repeat ABG this morning>> 86.  Curbsided with PCCM, discussed with Dr. Nelda Marseille. Advised palliative care involvement and comfort care.  CT chest>>interstitial edema, findings consistent with aspiration, abdomen and pelvis to assess his 2 abdominal drains and percutaneous nephrostomy tube>> persistent but improved. 11/26: His son [name] present in the room.  On BiPAP.  Left thoracentesis planned and completed by interventional  radiologist with 1 L of amber/low-density fluid removed. Elevated BNP greater than 600.  TTE ordered to further assess cardiac structure, pending.  19-Apr-2019: No improvement in clinical picture despite aggressive treatment. Reviewed CXR>> worsening infiltrates suggestive of early ARDS.  ABG without improvement despite continuous BIPAP. PCCM contacted. Daughter Angela Nevin updated on worsening condition.  Adult children and palliative care provider met, decision was made for comfort care. Daughter Angela Nevin and son [name] at bedside with Mr. Vahle.  Mr. Scherzer passed on 04-19-19 at 1641. His daughter Angela Nevin and son [name] were present at the time of his death.   Final Diagnoses:  1.   Cardiopulmonary arrest 2.   Acute on chronic hypoxic hypercarbic respiratory failure 3.   CO2 narcosis, BIPAP dependence 4.   Aspiration with dysphagia    The results of significant diagnostics from this hospitalization (including imaging, microbiology, ancillary and laboratory) are listed below for reference.    Significant Diagnostic Studies: Ct Chest Wo Contrast  Result Date: 04/11/2019 CLINICAL DATA:  85 year old male with left percutaneous nephrostomy tube and perinephric drain as well as recent respiratory distress concerning for aspiration. Patient initially presented with urosepsis and had percutaneous nephrostomy catheter placed on 03/22/2019 as well as percutaneous drain placements on 03/22/2019 and again on 03/28/2019. All drainage catheters were unfortunately inadvertently displaced and patient underwent replacement of percutaneous nephrostomy tube and 2 CT-guided drainage catheters on 04/02/2019. EXAM: CT CHEST WITHOUT AND ABDOMEN/PELVIS WITH CONTRAST TECHNIQUE: Multidetector CT imaging of the chest and abdomen was performed following the standard protocol during bolus administration of intravenous contrast. CONTRAST:  152m OMNIPAQUE IOHEXOL 300 MG/ML  SOLN COMPARISON:  Prior CT abdomen/pelvis 04/02/2019 FINDINGS: CT  CHEST FINDINGS Cardiovascular: Limited evaluation in the absence of intravenous contrast. Cardiomegaly.  No pericardial effusion. Extensive atherosclerotic calcifications throughout the coronary arteries. The main pulmonary artery is enlarged at 4.3 cm consistent with pulmonary arterial hypertension. No evidence of aortic aneurysm. Mediastinum/Nodes: Unremarkable thyroid gland.  No mediastinal mass. Lungs/Pleura: Moderate left and small right layering pleural effusions. Interlobular septal thickening is present consistent with interstitial edema. Multifocal patchy areas of airspace opacity also present dependently in both lungs. Debris is also visualized within the right lower and middle lobe bronchial trees. Findings are consistent with aspiration. Musculoskeletal: No acute fracture or aggressive appearing lytic or blastic osseous lesion. CT ABDOMEN FINDINGS Hepatobiliary: No focal liver abnormality is seen. No gallstones, gallbladder wall thickening, or biliary dilatation. Pancreas: Unremarkable. No pancreatic ductal dilatation or surrounding inflammatory changes. Spleen: Normal in size without focal abnormality. Adrenals/Urinary Tract: Normal adrenal glands. Unremarkable appearance of the right kidney. Stable 4.4 cm simple cyst exophytic from the upper pole of the left kidney. A left percutaneous nephrostomy tube is present and remains in good position. Nephrolithiasis is noted dependently within the upper pole collecting system. Persistent peripherally enhancing fluid collection in the anterior aspect of the renal hilum measuring approximately 1.9 by 4.0 cm. Two perinephric drainage catheters are present and remain in acceptable position within the complicated fluid collection in the anterior perinephric space. This fluid collection is very challenging to measure given the amorphous nature of the fluid collection. Comparing to the prior CT scan from 04/02/2019, the more anterior fluid collection has dramatically  decreased in craniocaudal dimension now measuring 3.7 x 4.0 x 2.5 cm compared to 5.4 x 5.8 x 3.1 cm previously. The second component now measures only 2.3 cm compared to 3.8 cm previously. Diffusely thick walled and trabeculated bladder, unchanged. Stomach/Bowel: No evidence of obstruction or focal bowel wall thickening. Vascular/Lymphatic: Extensive atherosclerotic vascular calcifications. No aneurysm or dissection. Other: No evidence of ascites. Fat containing right inguinal hernia. Musculoskeletal: No acute osseous abnormality. Soft tissue thickening and subcutaneous emphysema overlying the sacrum consistent with a sacral decubitus ulcer. Left total hip arthroplasty. Stable L3 compression fracture. IMPRESSION: 1. The left percutaneous nephrostomy tube and 2 percutaneous abscess drainage catheters remain in good position. 2. Persistent but significantly improved perinephric fluid collections as above. 3. Multifocal patchy airspace opacities in the dependent portions of the right greater than left lung with evidence of debris in the right lower and middle lobe bronchi. Findings are consistent with the clinical suspicion of aspiration. 4. Mild pulmonary edema. 5. Moderate left and small right pleural effusions. 6. Cardiomegaly. 7. Extensive coronary and aortic atherosclerotic calcifications. 8. Sacral decubitus ulcer. 9. Stable L3 compression fracture. Electronically Signed   By: Jacqulynn Cadet M.D.   On: 04/11/2019 16:43   Ct Abdomen Pelvis W Contrast  Result Date: 04/11/2019 CLINICAL DATA:  85 year old male with left percutaneous nephrostomy tube and perinephric drain as well as recent respiratory distress concerning for aspiration. Patient initially presented with urosepsis and had percutaneous nephrostomy catheter placed on 03/22/2019 as well as percutaneous drain placements on 03/22/2019 and again on 03/28/2019. All drainage catheters were unfortunately inadvertently displaced and patient underwent  replacement of percutaneous nephrostomy tube and 2 CT-guided drainage catheters on 04/02/2019. EXAM: CT CHEST WITHOUT AND ABDOMEN/PELVIS WITH CONTRAST TECHNIQUE: Multidetector CT imaging of the chest and abdomen was performed following the standard protocol during bolus administration of intravenous contrast. CONTRAST:  181m OMNIPAQUE IOHEXOL 300 MG/ML  SOLN COMPARISON:  Prior CT abdomen/pelvis 04/02/2019 FINDINGS: CT CHEST FINDINGS Cardiovascular: Limited evaluation in the absence of intravenous contrast. Cardiomegaly. No pericardial effusion. Extensive  atherosclerotic calcifications throughout the coronary arteries. The main pulmonary artery is enlarged at 4.3 cm consistent with pulmonary arterial hypertension. No evidence of aortic aneurysm. Mediastinum/Nodes: Unremarkable thyroid gland.  No mediastinal mass. Lungs/Pleura: Moderate left and small right layering pleural effusions. Interlobular septal thickening is present consistent with interstitial edema. Multifocal patchy areas of airspace opacity also present dependently in both lungs. Debris is also visualized within the right lower and middle lobe bronchial trees. Findings are consistent with aspiration. Musculoskeletal: No acute fracture or aggressive appearing lytic or blastic osseous lesion. CT ABDOMEN FINDINGS Hepatobiliary: No focal liver abnormality is seen. No gallstones, gallbladder wall thickening, or biliary dilatation. Pancreas: Unremarkable. No pancreatic ductal dilatation or surrounding inflammatory changes. Spleen: Normal in size without focal abnormality. Adrenals/Urinary Tract: Normal adrenal glands. Unremarkable appearance of the right kidney. Stable 4.4 cm simple cyst exophytic from the upper pole of the left kidney. A left percutaneous nephrostomy tube is present and remains in good position. Nephrolithiasis is noted dependently within the upper pole collecting system. Persistent peripherally enhancing fluid collection in the anterior  aspect of the renal hilum measuring approximately 1.9 by 4.0 cm. Two perinephric drainage catheters are present and remain in acceptable position within the complicated fluid collection in the anterior perinephric space. This fluid collection is very challenging to measure given the amorphous nature of the fluid collection. Comparing to the prior CT scan from 04/02/2019, the more anterior fluid collection has dramatically decreased in craniocaudal dimension now measuring 3.7 x 4.0 x 2.5 cm compared to 5.4 x 5.8 x 3.1 cm previously. The second component now measures only 2.3 cm compared to 3.8 cm previously. Diffusely thick walled and trabeculated bladder, unchanged. Stomach/Bowel: No evidence of obstruction or focal bowel wall thickening. Vascular/Lymphatic: Extensive atherosclerotic vascular calcifications. No aneurysm or dissection. Other: No evidence of ascites. Fat containing right inguinal hernia. Musculoskeletal: No acute osseous abnormality. Soft tissue thickening and subcutaneous emphysema overlying the sacrum consistent with a sacral decubitus ulcer. Left total hip arthroplasty. Stable L3 compression fracture. IMPRESSION: 1. The left percutaneous nephrostomy tube and 2 percutaneous abscess drainage catheters remain in good position. 2. Persistent but significantly improved perinephric fluid collections as above. 3. Multifocal patchy airspace opacities in the dependent portions of the right greater than left lung with evidence of debris in the right lower and middle lobe bronchi. Findings are consistent with the clinical suspicion of aspiration. 4. Mild pulmonary edema. 5. Moderate left and small right pleural effusions. 6. Cardiomegaly. 7. Extensive coronary and aortic atherosclerotic calcifications. 8. Sacral decubitus ulcer. 9. Stable L3 compression fracture. Electronically Signed   By: Jacqulynn Cadet M.D.   On: 04/11/2019 16:43   Ct Abdomen Pelvis W Contrast  Result Date: 04/02/2019 CLINICAL  DATA:  Patient admitted with sepsis found to have a left-sided obstructing pelvic stone with hydronephrosis and associated perinephric fluid collections for which the patient underwent image guided placement of a left-sided percutaneous nephrostomy catheter on 03/22/2019 as well as CT-guided placement of a perinephric fluid collection on both 03/22/2019 as well as 03/28/2019. Repeat CT imaging is performed secondary to lack of output from the percutaneous drainage catheters. EXAM: CT ABDOMEN AND PELVIS WITH CONTRAST TECHNIQUE: Multidetector CT imaging of the abdomen and pelvis was performed using the standard protocol following bolus administration of intravenous contrast. CONTRAST:  174m OMNIPAQUE IOHEXOL 300 MG/ML  SOLN COMPARISON:  CT abdomen and pelvis-03/24/2019; image guided left-sided percutaneous nephrostomy catheter placement-03/22/2019; CT-guided percutaneous catheter placement-03/22/2019; 03/28/2019 FINDINGS: Lower chest: Limited visualization of the lower thorax demonstrates  interval increase in small left and trace right-sided pleural effusion with associated worsening bibasilar consolidative opacities. Cardiomegaly. Coronary artery calcifications. Calcifications of the aortic valve root. No pericardial effusion. Hepatobiliary: Normal hepatic contour. There is a punctate subcentimeter hypoattenuating lesion within the right lobe of the liver (image 18, series 3), which is too small to adequately characterize of favored to represent a hepatic cyst. No discrete worrisome hepatic lesions. Normal appearance of the gallbladder given underdistention. No intra or extrahepatic biliary duct dilatation. No ascites. Pancreas: The pancreas appears somewhat atrophic but otherwise normal. Spleen: Normal appearance of the spleen. Note is made of a small splenule. Adrenals/Urinary Tract: There is slightly delayed enhancement and excretion of the left kidney in comparison to the right. Interval retraction of left-sided  percutaneous nephrostomy catheter external to the left renal collecting system with development of recurrent moderate left-sided pelvicaliectasis Interval retraction of both left-sided perinephric abscess drainage catheters with Re Q malacia of perinephric fluid with dominant collection about posteromedial aspect of the left kidney measuring approximately 4.5 x 2.1 cm and bilobed collection about the more inferior aspect of the left kidney measuring approximately 7.6 x 3.0 cm, similar to presentation CT scan performed 04/12/2019. Redemonstrated clustered stones within the left renal pelvis and superior pole of the left kidney. No evidence of right-sided nephrolithiasis or urinary obstruction. Redemonstrated approximately 4.1 x 3.2 cm hypoattenuating nonenhancing minimally complex cyst arising from the superomedial aspect the right kidney (image 32, series 3; image 8, series 8). No discrete right-sided renal lesions. Normal appearance of the bilateral adrenal glands. There is thickening involving the lateral aspects of the urinary bladder wall. Stomach/Bowel: Moderate to large colonic stool burden without evidence of enteric obstruction. Normal appearance of the terminal ileum and appendix. No pneumoperitoneum, pneumatosis or portal venous gas. Vascular/Lymphatic: Probably there is a large calcified atherosclerotic amount of irregular plaque through a tortuous but normal caliber abdominal aorta. The major branch vessels of the abdominal aorta, including the IMA, appear patent on this non CTA examination. No bulky retroperitoneal, mesenteric, pelvic or inguinal lymphadenopathy. Reproductive: Dystrophic calcifications within normal sized prostate gland. No free fluid the pelvic cul-de-sac. Other: Mild diffuse body wall anasarca. Tiny mesenteric fat containing periumbilical hernia. Large (at least 7.2 x 3.6 x 9.8 mesenteric fat containing indirect right-sided inguinal hernia. Musculoskeletal: Redemonstrates sacral wound  with associated absence/bony erosion of the distal end of the sacrum and partial absence of the coccyx (image 32, series 7). Post left total hip replacement. Unchanged severe (approximately 75%) L3 compression deformity, similar to presentation CT scan performed 03/22/2019. IMPRESSION: 1. Interval retraction of left-sided percutaneous nephrostomy catheter outside the left renal collecting system with recurrent moderate left-sided pelvicaliectasis. 2. Interval retraction/malpositioning of both perinephric abscess drainage catheters with recurrence of bilateral perinephric fluid collections. 3. Unchanged left-sided nephrolithiasis. 4. Slight increase in small left and trace right-sided pleural effusions. 5. Redemonstrated sacral wound with associated bony erosion involving the distal tip of the sacrum and coccyx. 6. Unchanged severe (approximately 75%) L3 compression deformity with persistent fracture line. Correlation for point tenderness at this location is advised. Further evaluation with lumbar spine MRI could performed as indicated. 7. Moderate to large colonic stool burden without evidence of enteric obstruction. 8.  Aortic Atherosclerosis (ICD10-I70.0). Electronically Signed   By: Sandi Mariscal M.D.   On: 04/02/2019 16:51   Ct Abdomen Pelvis W Contrast  Result Date: 03/27/2019 CLINICAL DATA:  Status post left percutaneous nephrostomy tube placement and percutaneous catheter drainage of adjacent left retroperitoneal abscess on 03/22/2019.  EXAM: CT ABDOMEN AND PELVIS WITH CONTRAST TECHNIQUE: Multidetector CT imaging of the abdomen and pelvis was performed using the standard protocol following bolus administration of intravenous contrast. CONTRAST:  139m OMNIPAQUE IOHEXOL 300 MG/ML  SOLN COMPARISON:  04/15/2019 FINDINGS: Lower chest: Bilateral lower lobe atelectasis with trace bilateral pleural effusions. Hepatobiliary: The liver is unremarkable. The gallbladder is relatively contracted and contains some  dependent calculi. No biliary dilatation. Pancreas: Unremarkable. No pancreatic ductal dilatation or surrounding inflammatory changes. Spleen: Normal in size without focal abnormality. Adrenals/Urinary Tract: Nephrostomy tube extends into the left renal pelvis. The left renal collecting system is decompressed and there is no hydronephrosis. Additional medial retroperitoneal drain has resulted in some decompression of the retroperitoneal abscess medial and inferior to the left kidney. There is a non drained component of abscess inferiorly with fluid anterior to the aortic bifurcation measuring 5.5 x 3.1 x 5.4 cm and communicating pocket lateral to this area measuring approximately 2.8 x 2.1 x 3.8 cm. Adjacent edema extends inferiorly in the left pelvic retroperitoneal space. 4 cm high density lesion of the medial upper left kidney may represent a hemorrhagic cyst or complex cystic lesion. The right kidney remains normal in appearance. No adrenal masses. The bladder demonstrates mild wall thickening. Stomach/Bowel: Gastric distension with air-fluid level in the stomach. Moderate fecal material throughout much of the colon with mild colonic ileus suspected. Small bowel is nondilated. No free air. Vascular/Lymphatic: Stable aortoiliac atherosclerosis. No enlarged lymph nodes. Reproductive: Prostate is unremarkable. Other: Stable right inguinal hernia containing fat. Musculoskeletal: Stable L3 compression fracture and diffuse lumbar degenerative disc disease. IMPRESSION: 1. Improvement in appearance of retroperitoneal infection after catheter drainage. There is a non drained component of abscess inferiorly with fluid anterior to the aortic bifurcation measuring 5.5 x 3.1 x 5.4 cm and communicating pocket lateral to this area measuring 2.8 x 2.1 x 3.8 cm. 2. Left renal collecting system is decompressed by the percutaneous nephrostomy tube. 3. 4 cm high density lesion of the medial upper left kidney may represent a  hemorrhagic cyst or complex cystic lesion. Follow-up warranted. 4. Bilateral lower lobe atelectasis with trace bilateral pleural effusions. 5. Gastric distension with air-fluid level in the stomach. 6. Moderate fecal material throughout much of the colon with mild colonic ileus suspected. 7. Stable right inguinal hernia containing fat. 8. Stable L3 compression fracture. Electronically Signed   By: GAletta EdouardM.D.   On: 03/27/2019 16:21   Ct Abdomen Pelvis W Contrast  Result Date: 04/11/2019 CLINICAL DATA:  Abdomen distension EXAM: CT ABDOMEN AND PELVIS WITH CONTRAST TECHNIQUE: Multidetector CT imaging of the abdomen and pelvis was performed using the standard protocol following bolus administration of intravenous contrast. CONTRAST:  1075mOMNIPAQUE IOHEXOL 300 MG/ML  SOLN COMPARISON:  03/29/2019, 01/19/2012, May 09, 2017 FINDINGS: Lower chest: Lung bases demonstrate small pleural effusions. Partial atelectasis within the posterior lower lobes. Cardiomegaly. Hepatobiliary: Subcentimeter hypodensities in the liver too small to further characterize. Small stones in the gallbladder. No biliary dilatation Pancreas: Unremarkable. No pancreatic ductal dilatation or surrounding inflammatory changes. Spleen: Normal in size without focal abnormality. Adrenals/Urinary Tract: Adrenal glands are normal. Complex exophytic cyst mid left kidney measuring 3.7 cm. Multiple stones within the left renal collecting system, measuring up to 13 mm in size. Subcentimeter hypodensities in the right kidney too small to further characterize. No right hydronephrosis. Moderate hydronephrosis of the left kidney. Urothelial enhancement of the left renal pelvis. The left ureter is difficult to follow. No definitive ureteral stones. Irregular mildly rim  enhancing left retroperitoneal fluid collection, extending from left renal hilus to the left upper pelvis. By 8.3 cm oblique craniocaudad. Asymmetric wall thickening of the bladder.  Possible small stones within the right dependent bladder. Stomach/Bowel: The stomach is nonenlarged. No significant small bowel distension. Mild diffuse gaseous enlargement of the left colon without definitive transition point to suggest obstruction. No colon wall thickening. Vascular/Lymphatic: Extensive aortic atherosclerosis. No aneurysm. Subcentimeter retroperitoneal nodes. Reproductive: Prostate is unremarkable. Other: No free air Musculoskeletal: Suspected transitional anatomy. For the purposes of reporting, transitional segment will be lumbarized S1. Acute to subacute marked compression fracture of L4 involving both the anterior and middle columns. No fracture lucency at the posterior neural arch. About 40% loss of height of the vertebral body. Midline sacral decubitus ulcer with bony remodeling/chronic erosion of the sacrococcygeal region IMPRESSION: 1. No significant small bowel distension. Mild gaseous dilatation, predominantly of the distal colon, without well-defined transition to suggest obstruction. Findings are suggestive of an ileus. 2. Moderate left hydronephrosis. Course of left ureter difficult to follow, secondary to an irregular mildly rim enhancing left retroperitoneal/perinephric mildly complex fluid collection that extends from left renal hilus to the upper pelvis. Possible small stones within the dependent right bladder. Findings could be secondary to passed kidney stone, complicated by forniceal rupture and organizing urinoma. Infected fluid collection/retroperitoneal abscess is also a consideration. There are multiple additional stones in the left kidney. The bladder demonstrates asymmetric wall thickening 3. 3.7 cm complex left renal cyst, suggest nonemergent MRI evaluation. 4. Small bilateral pleural effusions and cardiomegaly 5. Gallstones 6. Suspected transitional anatomy of the spine as detailed above. Suspected acute to subacute moderate severe compression fracture L4 involving the  anterior and middle columns. Electronically Signed   By: Donavan Foil M.D.   On: 04/08/2019 19:59   Ir Sinus/fist Tube Chk-non Gi  Result Date: 04/02/2019 INDICATION: Patient admitted on 04/08/2019 with sepsis found to have obstructing left-sided nephrolithiasis, for which the patient underwent image guided placement of a left-sided percutaneous drainage catheter and CT-guided placement of a perinephric abscess drainage catheter 03/22/2019. Patient underwent subsequent additional perinephric abscess drainage catheter placement on 03/28/2019. Unfortunately, CT scan performed earlier today demonstrates retraction of the percutaneous nephrostomy catheter outside the left renal collecting system as well as both perinephric drains outside of both perinephric fluid collections. EXAM: 1. ATTEMPTED NEPHROSTOGRAM VIA EXISTING NEPHROSTOMY CATHETER WITH SUBSEQUENT REMOVAL. 2. IMAGE GUIDED LEFT-SIDED PERCUTANEOUS NEPHROSTOMY CATHETER PLACEMENT 3. LEFT-SIDED PERINEPHRIC ABSCESS DRAINAGE CATHETER INJECTION X2 COMPARISON:  CT abdomen pelvis-earlier same day; 03/27/2019; 04/08/2019; image guided left-sided percutaneous nephrostomy catheter placement-03/22/2019; CT-guided left-sided perinephric abscess drainage catheter placement-03/22/2019; 03/28/2019 MEDICATIONS: Patient is currently admitted to the hospital receiving intravenous antibiotics; the antibiotic was administered in an appropriate time frame prior to skin puncture. ANESTHESIA/SEDATION: Moderate (conscious) sedation was employed during this procedure. A total of Fentanyl 37.5 mcg was administered intravenously. Moderate Sedation Time: 20 minutes. The patient's level of consciousness and vital signs were monitored continuously by radiology nursing throughout the procedure under my direct supervision. CONTRAST:  20 cc mL Isovue 300 administered into the left collecting system as well as the malpositioned left-sided percutaneous nephrostomy catheter as well as both  perinephric abscess drainage catheters. FLUOROSCOPY TIME:  5 minutes, 42 seconds (342 mGy) COMPLICATIONS: None immediate. PROCEDURE: The procedure, risks, benefits, and alternatives were explained to the patient. Questions regarding the procedure were encouraged and answered. The patient understands and consents to the procedure. A timeout was performed prior to the initiation of the procedure. The left  flank region as well as the external portions of the left-sided nephrostomy catheter as well as both perinephric drainage catheters were prepped with Betadine in a sterile fashion, and a sterile drape was applied covering the operative field. A sterile gown and sterile gloves were used for the procedure. Local anesthesia was provided with 1% Lidocaine with epinephrine. Preprocedural spot fluoroscopic image was obtained of the left upper abdomen as well as the existing left-sided percutaneous nephrostomy catheter in both perinephric abscess drains. Contrast was injected via the existing perinephric abscess drainage catheters however failed to delineate a patent tract to the location of the perinephric abscesses. As such, both drainage catheters cut and intact. Contrast was then injected via the existing malposition nephrostomy catheter however also failed to delineate patency of nephrostomy tract. As such, the external portion of the nephrostomy catheter was cut and nephrostomy catheter was removed intact. Ultrasound was used to localize the left kidney. Under direct ultrasound guidance, a 21 gauge needle was advanced into the renal collecting system. An ultrasound image documentation was performed. Access within the collecting system was confirmed with the efflux of urine followed by contrast injection. A Nitrex wire was advanced into the left renal collecting system however there is difficulty advancing the Accustick over the Nitrex wire due to the depth of the kidney and associated patient body habitus. As such, a  posteroinferior calyx, which was still opacified from contrast-enhanced CT scan performed earlier today, was targeted fluoroscopically with a [place] needle. Appropriate access to the left renal collecting system was confirmed with injection contrast advancement of a Nitrex wire into the left renal collecting system. Under fluoroscopic guidance, the access needle was exchanged for an Accustick set was advanced to the level of the left renal pelvis. Contrast injection was performed. Over a short Amplatz wire, the track was dilated allowing placement of a 10 French percutaneous nephrostomy catheter with end coiled and locked left renal pelvis. A small amount of contrast was injected demonstrating appropriate position functionality of the nephrostomy catheter The nephrostomy catheter was secured to the skin entrance site with 2 interrupted sutures and a Stat device. The nephrostomy with connected to a gravity bag a dressing was applied. The patient tolerated the procedure well without immediate postprocedural complication. FINDINGS: Fluoroscopic guided injection both perinephric drainage catheters demonstrates lack a residual subcutaneous track to the level of the known residual/recurrent perinephric abscesses. As such, both percutaneous drainage catheters were cut and removed intact. Fluoroscopic guided injection of the existing malposition of proximally catheter demonstrates lack of residual subcutaneous track to the level of the left renal system. As such, the pre-existing nephrostomy catheter was cut and removed intact. Ultimately, under fluoroscopic guidance, a dilated posteroinferior calyx (still opacified from contrast-enhanced CT scan performed earlier today), was targeted fluoroscopically allowing placement of a 10 French percutaneous nephrostomy catheter with end coiled and locked within the left renal pelvis. IMPRESSION: 1. Malpositioned left-sided percutaneous nephrostomy catheter requiring removal  and definitive new nephrostomy catheter placement. 2. Successful fluoroscopic guided placement of a new 10.2 French left-sided percutaneous nephrostomy catheter. 3. Malpositioned left-sided perinephric abscess drainage catheters requiring new CT-guided perinephric abscess drainage catheter placement. PLAN: - The patient was escorted to CT to undergo replacement of both left-sided perinephric abscess drainage catheters. - Recommend IV only CT scan of the abdomen and pelvis once output from both perinephric abscess drainage catheters is less than 10 cc per day (excluding flushes). Recommend drainage catheter injection prior to removal to exclude communication to the left renal  collecting system. - Per my discussion with the patient's urologist (Dr. Gloriann Loan) earlier today, we will plan on attempted antegrade nephrostogram and placement of left-sided ureteral stent once patient recovers from the above procedures. Electronically Signed   By: Sandi Mariscal M.D.   On: 04/02/2019 18:04   Ir Sinus/fist Tube Chk-non Gi  Result Date: 04/02/2019 INDICATION: Patient admitted on 03/30/2019 with sepsis found to have obstructing left-sided nephrolithiasis, for which the patient underwent image guided placement of a left-sided percutaneous drainage catheter and CT-guided placement of a perinephric abscess drainage catheter 03/22/2019. Patient underwent subsequent additional perinephric abscess drainage catheter placement on 03/28/2019. Unfortunately, CT scan performed earlier today demonstrates retraction of the percutaneous nephrostomy catheter outside the left renal collecting system as well as both perinephric drains outside of both perinephric fluid collections. EXAM: 1. ATTEMPTED NEPHROSTOGRAM VIA EXISTING NEPHROSTOMY CATHETER WITH SUBSEQUENT REMOVAL. 2. IMAGE GUIDED LEFT-SIDED PERCUTANEOUS NEPHROSTOMY CATHETER PLACEMENT 3. LEFT-SIDED PERINEPHRIC ABSCESS DRAINAGE CATHETER INJECTION X2 COMPARISON:  CT abdomen pelvis-earlier same  day; 03/27/2019; 03/18/2019; image guided left-sided percutaneous nephrostomy catheter placement-03/22/2019; CT-guided left-sided perinephric abscess drainage catheter placement-03/22/2019; 03/28/2019 MEDICATIONS: Patient is currently admitted to the hospital receiving intravenous antibiotics; the antibiotic was administered in an appropriate time frame prior to skin puncture. ANESTHESIA/SEDATION: Moderate (conscious) sedation was employed during this procedure. A total of Fentanyl 37.5 mcg was administered intravenously. Moderate Sedation Time: 20 minutes. The patient's level of consciousness and vital signs were monitored continuously by radiology nursing throughout the procedure under my direct supervision. CONTRAST:  20 cc mL Isovue 300 administered into the left collecting system as well as the malpositioned left-sided percutaneous nephrostomy catheter as well as both perinephric abscess drainage catheters. FLUOROSCOPY TIME:  5 minutes, 42 seconds (497 mGy) COMPLICATIONS: None immediate. PROCEDURE: The procedure, risks, benefits, and alternatives were explained to the patient. Questions regarding the procedure were encouraged and answered. The patient understands and consents to the procedure. A timeout was performed prior to the initiation of the procedure. The left flank region as well as the external portions of the left-sided nephrostomy catheter as well as both perinephric drainage catheters were prepped with Betadine in a sterile fashion, and a sterile drape was applied covering the operative field. A sterile gown and sterile gloves were used for the procedure. Local anesthesia was provided with 1% Lidocaine with epinephrine. Preprocedural spot fluoroscopic image was obtained of the left upper abdomen as well as the existing left-sided percutaneous nephrostomy catheter in both perinephric abscess drains. Contrast was injected via the existing perinephric abscess drainage catheters however failed to  delineate a patent tract to the location of the perinephric abscesses. As such, both drainage catheters cut and intact. Contrast was then injected via the existing malposition nephrostomy catheter however also failed to delineate patency of nephrostomy tract. As such, the external portion of the nephrostomy catheter was cut and nephrostomy catheter was removed intact. Ultrasound was used to localize the left kidney. Under direct ultrasound guidance, a 21 gauge needle was advanced into the renal collecting system. An ultrasound image documentation was performed. Access within the collecting system was confirmed with the efflux of urine followed by contrast injection. A Nitrex wire was advanced into the left renal collecting system however there is difficulty advancing the Accustick over the Nitrex wire due to the depth of the kidney and associated patient body habitus. As such, a posteroinferior calyx, which was still opacified from contrast-enhanced CT scan performed earlier today, was targeted fluoroscopically with a [place] needle. Appropriate access to  the left renal collecting system was confirmed with injection contrast advancement of a Nitrex wire into the left renal collecting system. Under fluoroscopic guidance, the access needle was exchanged for an Accustick set was advanced to the level of the left renal pelvis. Contrast injection was performed. Over a short Amplatz wire, the track was dilated allowing placement of a 10 French percutaneous nephrostomy catheter with end coiled and locked left renal pelvis. A small amount of contrast was injected demonstrating appropriate position functionality of the nephrostomy catheter The nephrostomy catheter was secured to the skin entrance site with 2 interrupted sutures and a Stat device. The nephrostomy with connected to a gravity bag a dressing was applied. The patient tolerated the procedure well without immediate postprocedural complication. FINDINGS:  Fluoroscopic guided injection both perinephric drainage catheters demonstrates lack a residual subcutaneous track to the level of the known residual/recurrent perinephric abscesses. As such, both percutaneous drainage catheters were cut and removed intact. Fluoroscopic guided injection of the existing malposition of proximally catheter demonstrates lack of residual subcutaneous track to the level of the left renal system. As such, the pre-existing nephrostomy catheter was cut and removed intact. Ultimately, under fluoroscopic guidance, a dilated posteroinferior calyx (still opacified from contrast-enhanced CT scan performed earlier today), was targeted fluoroscopically allowing placement of a 10 French percutaneous nephrostomy catheter with end coiled and locked within the left renal pelvis. IMPRESSION: 1. Malpositioned left-sided percutaneous nephrostomy catheter requiring removal and definitive new nephrostomy catheter placement. 2. Successful fluoroscopic guided placement of a new 10.2 French left-sided percutaneous nephrostomy catheter. 3. Malpositioned left-sided perinephric abscess drainage catheters requiring new CT-guided perinephric abscess drainage catheter placement. PLAN: - The patient was escorted to CT to undergo replacement of both left-sided perinephric abscess drainage catheters. - Recommend IV only CT scan of the abdomen and pelvis once output from both perinephric abscess drainage catheters is less than 10 cc per day (excluding flushes). Recommend drainage catheter injection prior to removal to exclude communication to the left renal collecting system. - Per my discussion with the patient's urologist (Dr. Gloriann Loan) earlier today, we will plan on attempted antegrade nephrostogram and placement of left-sided ureteral stent once patient recovers from the above procedures. Electronically Signed   By: Sandi Mariscal M.D.   On: 04/02/2019 18:04   Dg Chest Port 1 View  Result Date: 05/07/2019 CLINICAL  DATA:  Hypoxia. EXAM: PORTABLE CHEST 1 VIEW COMPARISON:  04/12/2019 FINDINGS: The cardiac silhouette remains enlarged. There are persistent small bilateral pleural effusions. There are relatively extensive airspace opacities bilaterally which are greatest in the upper lobes with interval substantial worsening on the left. No pneumothorax is identified. IMPRESSION: 1. Diffuse bilateral airspace opacities with substantial interval worsening on the left which may reflect pneumonia or edema. 2. Small bilateral pleural effusions. 3. Unchanged cardiomegaly. Electronically Signed   By: Logan Bores M.D.   On: 05-07-2019 07:13   Dg Chest Port 1 View  Result Date: 04/12/2019 CLINICAL DATA:  Status post left thoracentesis EXAM: PORTABLE CHEST 1 VIEW COMPARISON:  CT chest dated 04/11/2019 FINDINGS: Multifocal patchy opacities, right upper lobe predominant, suspicious for pneumonia. Small bilateral pleural effusions, improved on the left status post thoracentesis. No pneumothorax is seen. Cardiomegaly.  Thoracic aortic atherosclerosis IMPRESSION: Multifocal patchy opacities, right upper lobe predominant, suspicious for pneumonia. Small bilateral pleural effusions, improved on the left status post thoracentesis. No pneumothorax is seen. Electronically Signed   By: [name] Hy M.D.   On: 04/12/2019 12:32   Dg Chest [facility] [address]  Result Date: 04/10/2019 CLINICAL DATA:  Shortness of breath. EXAM: PORTABLE CHEST 1 VIEW COMPARISON:  April 06, 2019 FINDINGS: The heart size remains enlarged. Aortic calcifications are noted. There is a large left-sided pleural effusion. There is a small to moderate size right-sided pleural effusion, both of which have worsened since the prior study. There is volume overload with pulmonary edema. There is a persistent left lower lobe airspace opacity. There is atelectasis at the right lung base. There appears to be increased size of the thoracic aorta, specifically the aortic arch and  proximal descending aorta. IMPRESSION: 1. Moderate to large bilateral pleural effusions, left greater than right. These have both increased in size from the prior study. 2. Vascular congestion with evidence for developing pulmonary edema. 3. Persistent left lower lung zone airspace opacity concerning for pneumonia. 4. Right basilar airspace opacity favored to represent atelectasis. 5. Findings concerning for a thoracic aortic aneurysm which may have increased in size from prior chest x-ray in 2019. Consider further evaluation with a contrast enhanced CT of the chest. Electronically Signed   By: Constance Holster M.D.   On: 04/10/2019 19:33   Dg Chest Port 1 View  Result Date: 04/06/2019 CLINICAL DATA:  Altered mental status, possible aspiration. EXAM: PORTABLE CHEST 1 VIEW COMPARISON:  05/27/2017. FINDINGS: Trachea is midline. Heart is enlarged, stable. Thoracic aorta is calcified. Left mid and lower lung zone airspace consolidation with a left pleural effusion. Biapical pleural thickening. Mild interstitial prominence may be chronic. IMPRESSION: 1. Left mid and lower lung zone airspace consolidation is worrisome for pneumonia. 2. Small left pleural effusion. 3.  Aortic atherosclerosis (ICD10-170.0). Electronically Signed   By: Lorin Picket M.D.   On: 04/06/2019 14:20   Aas  Result Date: 03/18/2019 CLINICAL DATA:  Abdomen distension EXAM: DG ABDOMEN ACUTE W/ 1V CHEST COMPARISON:  05/28/2017, 05/27/2017 FINDINGS: Single-view chest demonstrate small pleural effusions and basilar atelectasis. Mild cardiomegaly with aortic atherosclerosis. Prominent central pulmonary vessels, possible arterial hypertension. Supine and upright views of the abdomen demonstrate no free air beneath the diaphragm. Mild to moderate diffuse gaseous enlargement of small and large bowel with scattered rectal gas. Status post left hip replacement. No radiopaque calculi. IMPRESSION: 1. Cardiomegaly with small pleural effusions and  basilar atelectasis 2. Mild to moderate diffuse increased small and large bowel gas suggestive of an ileus. Electronically Signed   By: Donavan Foil M.D.   On: 04/01/2019 16:49   Ir Nephrostomy Placement Left  Result Date: 03/22/2019 INDICATION: 85 year old with left hydronephrosis and fluid collections in the left abdomen along the left ureter. Plan for percutaneous nephrostomy tube placement and drainage of the abdominal fluid collections. EXAM: LEFT PERCUTANEOUS NEPHROSTOMY TUBE PLACEMENT WITH ULTRASOUND AND FLUOROSCOPIC GUIDANCE COMPARISON:  None. MEDICATIONS: Ciprofloxacin 400 mg; The antibiotic was administered in an appropriate time frame prior to skin puncture. ANESTHESIA/SEDATION: Fentanyl 75 mcg IV The patient was continuously monitored during the procedure by the interventional radiology nurse under my direct supervision. CONTRAST:  10 mL Omnipaque 300-administered into the collecting system(s) FLUOROSCOPY TIME:  Fluoroscopy Time: 2 minutes, 6 seconds, 13 mGy COMPLICATIONS: None immediate. PROCEDURE: Informed written consent was obtained from the patient after a thorough discussion of the procedural risks, benefits and alternatives. All questions were addressed. Maximal Sterile Barrier Technique was utilized including caps, mask, sterile gowns, sterile gloves, sterile drape, hand hygiene and skin antiseptic. A timeout was performed prior to the initiation of the procedure. Patient was placed prone. Ultrasound was used to identify the left kidney. The left  flank was prepped and draped in sterile fashion. Skin was anesthetized with 1% lidocaine. Using ultrasound guidance, 21 gauge needle was directed into a mid/upper pole calyx. Wire was advanced into the collecting system. An Accustick dilator set was placed. Tract was dilated to accommodate a 10.2 [place] multipurpose drain. Fluid was aspirated from the left renal collecting system and specimen was sent for culture. Catheter was sutured to skin and  attached to a gravity bag. FINDINGS: Moderate left hydronephrosis. Access obtained from a mid/upper pole calyx. Renal collecting system was decompressed at the end of the procedure. IMPRESSION: Successful placement of left percutaneous nephrostomy tube with ultrasound and fluoroscopic guidance. Urine specimen sent for culture. Electronically Signed   By: Markus Daft M.D.   On: 03/22/2019 13:43   Ct Image Guided Fluid Drain By Catheter  Result Date: 04/03/2019 INDICATION: Recent admission with urosepsis, post left-sided percutaneous nephrostomy catheter placement on 03/22/2019 as well as CT guided perinephric abscess drainage catheter placement on 03/22/2019 and again on 03/28/2019. Unfortunately, CT scan of the abdomen pelvis performed earlier today retraction of the nephrostomy catheter outside the left renal collecting system as well as retraction of both perinephric abscess drainage catheters outside the residual perinephric fluid collection/abscesses. Patient has undergone image guided placement of a new left-sided percutaneous nephrostomy catheter earlier today however attempted fluoroscopic guided replacement of the perinephric drains was not possible due to lack of a subcutaneous track. As such, patient now presents for CT guided replacement of perinephric abscess drainage catheters. EXAM: CT IMAGE GUIDED FLUID DRAIN BY CATHETER x2 COMPARISON:  Image guided percutaneous nephrostomy catheter placement-03/22/2019; earlier same day; CT-guided percutaneous drainage catheter placement-03/22/2019; 03/28/2019; CT abdomen pelvis-earlier same day; 03/27/2019; 04/10/2019 MEDICATIONS: The patient is currently admitted to the hospital and receiving intravenous antibiotics. The antibiotics were administered within an appropriate time frame prior to the initiation of the procedure. ANESTHESIA/SEDATION: None - the patient was still sedated from medications administered during preceding nephrostomy placement CONTRAST:   None COMPLICATIONS: None immediate. PROCEDURE: Informed written consent was obtained from the patient after a discussion of the risks, benefits and alternatives to treatment. The patient was placed supine, slightly RPO on the CT gantry and a pre procedural CT was performed re-demonstrating the known abscess/fluid collections within the left perinephric space with dominant collection about the posteromedial aspect the left kidney measuring approximately 4.4 by 1.8 cm (image 59, series 3 and additional bilobed collection about the more caudal aspect of the left kidney measuring approximately 7.7 x 3.0 cm (image 63, series 2). The procedure was planned. A timeout was performed prior to the initiation of the procedure. The skin overlying the left lateral abdomen was prepped and draped in the usual sterile fashion. The overlying soft tissues were anesthetized with 1% lidocaine with epinephrine. Appropriate trajectory was planned with the use of a 22 gauge spinal needles. Next, 18 gauge trocar needles were advanced into both perinephric abscess fluid collections under intermittent CT guidance. Short Amplatz wires were coiled within both collections. Appropriate positioning was confirmed with a limited CT scan. The tracks were serially dilated allowing placement of 10.2 French drainage catheters into both residual/recurrent perinephric fluid collections. The dominant caudal collection yielded approximately 10 cc of purulent material while the smaller collection yielded approximately 5 cc of purulent material. Both drainage catheters were flushed with a small amount of saline and connected to JP bulbs, sutured in place. Dressings and StatLock devices were applied. The patient tolerated the procedure well without immediate post procedural complication. IMPRESSION: Successful  CT guided placement of a 10 [place] all purpose drain catheter into residual/recurrent perinephric abscesses with dominant collection yielding 10 cc of  purulent fluid and smaller collection yielding 5 cc of purulent fluid. PLAN: - Recommend IV only CT scan the abdomen pelvis 1 output from both perinephric abscess drainage catheters is less than 10 cc per day (excluding flush). - Recommend drainage catheter injection prior to removal to exclude communication to the left renal collecting system. Electronically Signed   By: Sandi Mariscal M.D.   On: 04/03/2019 07:54   Ct Image Guided Fluid Drain By Catheter  Result Date: 04/03/2019 INDICATION: Recent admission with urosepsis, post left-sided percutaneous nephrostomy catheter placement on 03/22/2019 as well as CT guided perinephric abscess drainage catheter placement on 03/22/2019 and again on 03/28/2019. Unfortunately, CT scan of the abdomen pelvis performed earlier today retraction of the nephrostomy catheter outside the left renal collecting system as well as retraction of both perinephric abscess drainage catheters outside the residual perinephric fluid collection/abscesses. Patient has undergone image guided placement of a new left-sided percutaneous nephrostomy catheter earlier today however attempted fluoroscopic guided replacement of the perinephric drains was not possible due to lack of a subcutaneous track. As such, patient now presents for CT guided replacement of perinephric abscess drainage catheters. EXAM: CT IMAGE GUIDED FLUID DRAIN BY CATHETER x2 COMPARISON:  Image guided percutaneous nephrostomy catheter placement-03/22/2019; earlier same day; CT-guided percutaneous drainage catheter placement-03/22/2019; 03/28/2019; CT abdomen pelvis-earlier same day; 03/27/2019; 03/23/2019 MEDICATIONS: The patient is currently admitted to the hospital and receiving intravenous antibiotics. The antibiotics were administered within an appropriate time frame prior to the initiation of the procedure. ANESTHESIA/SEDATION: None - the patient was still sedated from medications administered during preceding nephrostomy  placement CONTRAST:  None COMPLICATIONS: None immediate. PROCEDURE: Informed written consent was obtained from the patient after a discussion of the risks, benefits and alternatives to treatment. The patient was placed supine, slightly RPO on the CT gantry and a pre procedural CT was performed re-demonstrating the known abscess/fluid collections within the left perinephric space with dominant collection about the posteromedial aspect the left kidney measuring approximately 4.4 by 1.8 cm (image 59, series 3 and additional bilobed collection about the more caudal aspect of the left kidney measuring approximately 7.7 x 3.0 cm (image 63, series 2). The procedure was planned. A timeout was performed prior to the initiation of the procedure. The skin overlying the left lateral abdomen was prepped and draped in the usual sterile fashion. The overlying soft tissues were anesthetized with 1% lidocaine with epinephrine. Appropriate trajectory was planned with the use of a 22 gauge spinal needles. Next, 18 gauge trocar needles were advanced into both perinephric abscess fluid collections under intermittent CT guidance. Short Amplatz wires were coiled within both collections. Appropriate positioning was confirmed with a limited CT scan. The tracks were serially dilated allowing placement of 10.2 French drainage catheters into both residual/recurrent perinephric fluid collections. The dominant caudal collection yielded approximately 10 cc of purulent material while the smaller collection yielded approximately 5 cc of purulent material. Both drainage catheters were flushed with a small amount of saline and connected to JP bulbs, sutured in place. Dressings and StatLock devices were applied. The patient tolerated the procedure well without immediate post procedural complication. IMPRESSION: Successful CT guided placement of a 10 French all purpose drain catheter into residual/recurrent perinephric abscesses with dominant  collection yielding 10 cc of purulent fluid and smaller collection yielding 5 cc of purulent fluid. PLAN: - Recommend IV only  CT scan the abdomen pelvis 1 output from both perinephric abscess drainage catheters is less than 10 cc per day (excluding flush). - Recommend drainage catheter injection prior to removal to exclude communication to the left renal collecting system. Electronically Signed   By: Sandi Mariscal M.D.   On: 04/03/2019 07:54   Ct Image Guided Drainage By Percutaneous Catheter  Result Date: 03/28/2019 INDICATION: History of left-sided urinary obstruction complicated by development of a perinephric abscess, post percutaneous nephrostomy catheter placement as well as perinephric abscess drainage catheter placement on 03/22/2019. Unfortunately, postprocedural CT scan of the abdomen pelvis performed 03/27/2019 demonstrates a residual undrained fluid collection about the inferior medial aspect of the left kidney for which the patient presents now for additional perinephric drainage catheter placement. EXAM: CT IMAGE GUIDED DRAINAGE BY PERCUTANEOUS CATHETER COMPARISON:  CT abdomen pelvis-03/28/2019; 03/27/2019; CT-guided percutaneous perinephric abscess drainage catheter placement-03/22/2019; image guided percutaneous nephrostomy catheter placement-03/22/2019 MEDICATIONS: The patient is currently admitted to the hospital and receiving intravenous antibiotics. The antibiotics were administered within an appropriate time frame prior to the initiation of the procedure. ANESTHESIA/SEDATION: Moderate (conscious) sedation was employed during this procedure. A total of Versed 1 mg and Fentanyl 12.5 mcg was administered intravenously. Moderate Sedation Time: 18 minutes. The patient's level of consciousness and vital signs were monitored continuously by radiology nursing throughout the procedure under my direct supervision. CONTRAST:  None COMPLICATIONS: None immediate. PROCEDURE: Informed written consent was  obtained from the patient after a discussion of the risks, benefits and alternatives to treatment. The patient was placed supine, slightly RPO on the CT gantry and a pre procedural CT was performed re-demonstrating the known abscess/fluid collection about the inferior medial aspect of the left kidney with dominant medial component measuring approximately 5.7 x 3.8 cm and smaller lateral component measuring approximately 2.3 x 2.3 cm (both collections seen on image 59, series 2). The procedure was planned. A timeout was performed prior to the initiation of the procedure. The skin overlying the lateral aspect of the left lower abdomen/pelvis was prepped and draped in the usual sterile fashion. The overlying soft tissues were anesthetized with 1% lidocaine with epinephrine. Appropriate trajectory was planned with the use of a 22 gauge spinal needle. An 18 gauge trocar needle was advanced sparing both abscess/fluid collections and a short Amplatz super stiff wire was coiled within the dominant more medially positioned collection. Appropriate positioning was confirmed with a limited CT scan. The tract was serially dilated allowing placement of a 10 [place] all-purpose drainage catheter. Appropriate positioning was confirmed with a limited postprocedural CT scan. Approximately 30 ml of purulent fluid was aspirated. The tube was connected to a JP bulb and sutured in place. A dressing was placed. The patient tolerated the procedure well without immediate post procedural complication. IMPRESSION: Successful CT guided placement of a 10 French all purpose drain catheter into the residual perinephric fluid collections adjacent to the inferior medial aspect the left kidney with aspiration of 30 mL of purulent fluid. Samples were sent to the laboratory as requested by the ordering clinical team. PLAN: - Recommend flushing of both existing percutaneous drainage catheters with 10 cc of saline b.i.d. - Once drainage catheter output  is less than 10 cc per day (excluding flush volumes) repeat (preferably IV contrast-enhanced) CT scan of the abdomen and pelvis could be performed as indicated. - Note, both drainage will likely require drainage catheter injections prior to consideration of removal. - Continued nephrostomy management as per the urologic service, though ultimately, patient  may be a candidate for attempted fluoroscopic guided antegrade left-sided ureteral stent placement. Electronically Signed   By: Sandi Mariscal M.D.   On: 03/28/2019 16:04   Ct Image Guided Drainage Percut Cath  Peritoneal Retroperit  Result Date: 03/22/2019 INDICATION: 85 year old with retroperitoneal complex fluid collection and left hydronephrosis. Patient has already undergone percutaneous left nephrostomy tube placement. Plan for CT-guided drainage of the complex retroperitoneal fluid collection. EXAM: CT-GUIDED LEFT RETROPERITONEAL ABSCESS DRAIN MEDICATIONS: The patient is currently admitted to the hospital and receiving intravenous antibiotics. The antibiotics were administered within an appropriate time frame prior to the initiation of the procedure. ANESTHESIA/SEDATION: Fentanyl 50 mcg The patient was continuously monitored during the procedure by the interventional radiology nurse under my direct supervision. COMPLICATIONS: None immediate. PROCEDURE: Informed written consent was obtained from the patient and daughter after a thorough discussion of the procedural risks, benefits and alternatives. All questions were addressed. A timeout was performed prior to the initiation of the procedure. Patient was placed prone. CT images through the abdomen were obtained. The left retroperitoneal complex fluid collection was targeted. The left flank was prepped with chlorhexidine and sterile field was created. Skin and soft tissues were anesthetized with 1% lidocaine. Using CT guidance, a 18 gauge trocar needle was directed into the retroperitoneal fluid collection.  Brown purulent fluid was aspirated. Stiff Amplatz wire was advanced into the collection. Tract was dilated to accommodate a 10 [place] drain. Additional brown purulent fluid was removed. Follow up CT images were obtained. Catheter was sutured to skin and attached to a suction bulb. FINDINGS: Left kidney has been decompressed with a percutaneous nephrostomy tube. Perinephric and left retroperitoneal complex fluid collection was targeted. The lateral aspect of the collection was punctured and purulent fluid was aspirated. Stiff Amplatz wire advanced into a portion of the collection and a 10 French drain was placed. Follow up CT images demonstrated that the drain may not fully decompress the more medial collections. IMPRESSION: CT-guided placement of a drainage catheter within the complex left retroperitoneal abscess collection. Not clear if the most medial aspect of the abscess will be drained from this catheter. Recommend short-term follow-up imaging to evaluate this drainage catheter and the complex collection. Electronically Signed   By: Markus Daft M.D.   On: 03/22/2019 16:54   US Thoracentesis Asp Pleural Space W/img Guide  Result Date: 04/12/2019 INDICATION: Patient with history CHF, perinephric abscess, bilateral pleural effusions, respiratory failure, COPD; request received for diagnostic and therapeutic left thoracentesis. EXAM: ULTRASOUND GUIDED DIAGNOSTIC AND THERAPEUTIC LEFT THORACENTESIS MEDICATIONS: None COMPLICATIONS: None immediate. PROCEDURE: An ultrasound guided thoracentesis was thoroughly discussed with the patient/son and questions answered. The benefits, risks, alternatives and complications were also discussed. The patient understands and wishes to proceed with the procedure. Written consent was obtained. Ultrasound was performed to localize and mark an adequate pocket of fluid in the left chest. The area was then prepped and draped in the normal sterile fashion. 1% Lidocaine was used for  local anesthesia. Under ultrasound guidance a 6 Fr Safe-T-Centesis catheter was introduced. Thoracentesis was performed. The catheter was removed and a dressing applied. FINDINGS: A total of approximately 1 liter of amber/blood-tinged fluid was removed. Samples were sent to the laboratory as requested by the clinical team. IMPRESSION: Successful ultrasound guided diagnostic and therapeutic left thoracentesis yielding 1 liter of pleural fluid. Read by: Rowe Robert, [name] Electronically Signed   By: Lucrezia Europe M.D.   On: 04/12/2019 11:41    Microbiology: Recent Results (from the past 240  hour(s))  Gram stain     Status: None   Collection Time: 04/12/19 11:15 AM   Specimen: PATH Cytology Pleural fluid  Result Value Ref Range Status   Specimen Description PLEURAL  Final   Special Requests NONE  Final   Gram Stain   Final    FEW WBC PRESENT, PREDOMINANTLY MONONUCLEAR NO ORGANISMS SEEN Performed at [facility], [address] [place], [place] [address]    Report Status 04/12/2019 FINAL  Final  Culture, body fluid-bottle     Status: None (Preliminary result)   Collection Time: 04/12/19 11:15 AM   Specimen: Pleura  Result Value Ref Range Status   Specimen Description PLEURAL  Final   Special Requests NONE  Final   Culture   Final    NO GROWTH < 24 HOURS Performed at [facility], [place] [address], [place], [place] [address]    Report Status PENDING  Incomplete     Labs: Basic Metabolic Panel: Recent Labs  Lab 04/07/19 0353 04/10/19 1953 04/11/19 1701 04/12/19 0603 2019/04/29 0404 2019/04/29 1157  NA 143 147* 145 148* 149*  --   K 4.0 3.4* 3.1* 3.1* 3.3*  --   CL 100 101 100 99 94*  --   CO2 37* 39* 40* 40* 43*  --   GLUCOSE 106* 121* 107* 125* 151*  --   BUN '19 15 11 8 '$ 7*  --   CREATININE 0.51* 0.57* 0.55* 0.60* 0.60*  --   CALCIUM 9.2 8.9 8.7* 8.8* 8.6*  --   MG  --  2.2  --   --   --  2.1  PHOS  --  3.3  --   --   --   --    Liver Function Tests: Recent Labs    Lab 04/10/19 1953  AST 17  ALT 14  ALKPHOS 45  BILITOT 0.4  PROT 6.1*  ALBUMIN 2.3*   No results for input(s): LIPASE, AMYLASE in the last 168 hours. No results for input(s): AMMONIA in the last 168 hours. CBC: Recent Labs  Lab 04/10/19 1953 04/11/19 1701 04/12/19 0603 April 29, 2019 0404 2019-04-29 1112  WBC 6.7 7.2 7.8 9.6 8.6  NEUTROABS 4.9  --   --   --   --   HGB 8.6* 8.7* 9.1* 8.2* 7.8*  HCT 29.7* 29.4* 31.3* 27.3* 26.3*  MCV 106.5* 106.9* 105.7* 104.6* 105.6*  PLT 266 264 259 218 219   Cardiac Enzymes: No results for input(s): CKTOTAL, CKMB, CKMBINDEX, TROPONINI in the last 168 hours. D-Dimer No results for input(s): DDIMER in the last 72 hours. BNP: Invalid input(s): POCBNP CBG: Recent Labs  Lab 04/12/19 2012 29-Apr-2019 0000 04-29-19 0346 2019/04/29 0731 2019/04/29 1128  GLUCAP 126* 140* 148* 147* 147*   Anemia work up No results for input(s): VITAMINB12, FOLATE, FERRITIN, TIBC, IRON, RETICCTPCT in the last 72 hours. Urinalysis    Component Value Date/Time   COLORURINE AMBER (A) 03/29/2019 1117   APPEARANCEUR CLOUDY (A) 03/29/2019 1117   LABSPEC 1.017 03/29/2019 1117   PHURINE 8.0 03/29/2019 1117   GLUCOSEU NEGATIVE 03/29/2019 1117   HGBUR LARGE (A) 03/29/2019 1117   BILIRUBINUR NEGATIVE 03/29/2019 1117   KETONESUR NEGATIVE 03/29/2019 1117   PROTEINUR 100 (A) 03/29/2019 1117   UROBILINOGEN 0.2 08/06/2013 1006   NITRITE NEGATIVE 03/29/2019 1117   LEUKOCYTESUR SMALL (A) 03/29/2019 1117   Sepsis Labs Invalid input(s): PROCALCITONIN,  WBC,  LACTICIDVEN     SIGNED:  Kayleen Memos, MD  [facility] 2019/04/26, 5:42 PM Pager   If 7PM-7AM, please contact night-coverage [email or web address] Password [email or web address]

## 2019-04-17 NOTE — Care Management Important Message (Signed)
Important Message  Patient Details  Name: TUAN TIPPIN MRN: [id number] Date of Birth: 09-23-1926   Medicare Important Message Given:  Yes     Shelda Altes 17-Apr-2019, 11:07 AM

## 2019-04-17 NOTE — Progress Notes (Addendum)
Pt is now resting quietly with eyes closed. Resp 20's with stats 93-95%.

## 2019-04-17 DEATH — deceased

## 2019-04-18 LAB — CULTURE, BLOOD (ROUTINE X 2)
Culture: NO GROWTH
Culture: NO GROWTH
Special Requests: ADEQUATE
Special Requests: ADEQUATE

## 2019-05-28 LAB — ACID FAST CULTURE WITH REFLEXED SENSITIVITIES (MYCOBACTERIA): Acid Fast Culture: NEGATIVE
